# Patient Record
Sex: Female | Born: 1940 | Race: White | Hispanic: No | Marital: Married | Smoking: Never smoker
Health system: Southern US, Community
[De-identification: ages and names within clinical notes are randomized; demographics above are authoritative.]

## PROBLEM LIST (undated history)

## (undated) DIAGNOSIS — I10 Essential (primary) hypertension: Secondary | ICD-10-CM

## (undated) DIAGNOSIS — J45909 Unspecified asthma, uncomplicated: Secondary | ICD-10-CM

## (undated) DIAGNOSIS — I4891 Unspecified atrial fibrillation: Secondary | ICD-10-CM

## (undated) DIAGNOSIS — I499 Cardiac arrhythmia, unspecified: Secondary | ICD-10-CM

## (undated) DIAGNOSIS — C439 Malignant melanoma of skin, unspecified: Secondary | ICD-10-CM

## (undated) DIAGNOSIS — T4145XA Adverse effect of unspecified anesthetic, initial encounter: Secondary | ICD-10-CM

## (undated) DIAGNOSIS — K219 Gastro-esophageal reflux disease without esophagitis: Secondary | ICD-10-CM

## (undated) DIAGNOSIS — J189 Pneumonia, unspecified organism: Secondary | ICD-10-CM

## (undated) DIAGNOSIS — L570 Actinic keratosis: Secondary | ICD-10-CM

## (undated) DIAGNOSIS — Z87442 Personal history of urinary calculi: Secondary | ICD-10-CM

## (undated) DIAGNOSIS — F419 Anxiety disorder, unspecified: Secondary | ICD-10-CM

## (undated) DIAGNOSIS — Z803 Family history of malignant neoplasm of breast: Secondary | ICD-10-CM

## (undated) DIAGNOSIS — D649 Anemia, unspecified: Secondary | ICD-10-CM

## (undated) DIAGNOSIS — S060X9A Concussion with loss of consciousness of unspecified duration, initial encounter: Secondary | ICD-10-CM

## (undated) DIAGNOSIS — C801 Malignant (primary) neoplasm, unspecified: Secondary | ICD-10-CM

## (undated) DIAGNOSIS — I4819 Other persistent atrial fibrillation: Secondary | ICD-10-CM

## (undated) DIAGNOSIS — C50919 Malignant neoplasm of unspecified site of unspecified female breast: Secondary | ICD-10-CM

## (undated) DIAGNOSIS — S060XAA Concussion with loss of consciousness status unknown, initial encounter: Secondary | ICD-10-CM

## (undated) DIAGNOSIS — S53106A Unspecified dislocation of unspecified ulnohumeral joint, initial encounter: Secondary | ICD-10-CM

## (undated) DIAGNOSIS — I119 Hypertensive heart disease without heart failure: Secondary | ICD-10-CM

## (undated) DIAGNOSIS — T8859XA Other complications of anesthesia, initial encounter: Secondary | ICD-10-CM

## (undated) DIAGNOSIS — M1990 Unspecified osteoarthritis, unspecified site: Secondary | ICD-10-CM

## (undated) DIAGNOSIS — Z923 Personal history of irradiation: Secondary | ICD-10-CM

## (undated) HISTORY — PX: BUNIONECTOMY: SHX129

## (undated) HISTORY — PX: MASTECTOMY: SHX3

## (undated) HISTORY — PX: KNEE ARTHROSCOPY: SUR90

## (undated) HISTORY — DX: Other persistent atrial fibrillation: I48.19

## (undated) HISTORY — PX: CATARACT EXTRACTION: SUR2

## (undated) HISTORY — PX: COLONOSCOPY W/ POLYPECTOMY: SHX1380

## (undated) HISTORY — DX: Family history of malignant neoplasm of breast: Z80.3

## (undated) HISTORY — DX: Hypertensive heart disease without heart failure: I11.9

## (undated) HISTORY — PX: OTHER SURGICAL HISTORY: SHX169

## (undated) HISTORY — DX: Personal history of irradiation: Z92.3

## (undated) HISTORY — DX: Malignant melanoma of skin, unspecified: C43.9

## (undated) HISTORY — PX: MELANOMA EXCISION: SHX5266

## (undated) HISTORY — PX: JOINT REPLACEMENT: SHX530

---

## 1999-05-18 ENCOUNTER — Other Ambulatory Visit: Admission: RE | Admit: 1999-05-18 | Discharge: 1999-05-18 | Payer: Self-pay | Admitting: Family Medicine

## 2000-05-19 ENCOUNTER — Other Ambulatory Visit: Admission: RE | Admit: 2000-05-19 | Discharge: 2000-05-19 | Payer: Self-pay | Admitting: Family Medicine

## 2001-05-19 ENCOUNTER — Other Ambulatory Visit: Admission: RE | Admit: 2001-05-19 | Discharge: 2001-05-19 | Payer: Self-pay | Admitting: Family Medicine

## 2001-11-01 ENCOUNTER — Encounter: Admission: RE | Admit: 2001-11-01 | Discharge: 2001-11-01 | Payer: Self-pay | Admitting: Urology

## 2001-11-01 ENCOUNTER — Encounter: Payer: Self-pay | Admitting: Urology

## 2002-05-21 ENCOUNTER — Other Ambulatory Visit: Admission: RE | Admit: 2002-05-21 | Discharge: 2002-05-21 | Payer: Self-pay | Admitting: Family Medicine

## 2003-05-27 ENCOUNTER — Other Ambulatory Visit: Admission: RE | Admit: 2003-05-27 | Discharge: 2003-05-27 | Payer: Self-pay | Admitting: Family Medicine

## 2003-08-05 ENCOUNTER — Ambulatory Visit (HOSPITAL_COMMUNITY): Admission: RE | Admit: 2003-08-05 | Discharge: 2003-08-05 | Payer: Self-pay | Admitting: Gastroenterology

## 2004-05-27 ENCOUNTER — Other Ambulatory Visit: Admission: RE | Admit: 2004-05-27 | Discharge: 2004-05-27 | Payer: Self-pay | Admitting: Family Medicine

## 2005-07-06 ENCOUNTER — Other Ambulatory Visit: Admission: RE | Admit: 2005-07-06 | Discharge: 2005-07-06 | Payer: Self-pay | Admitting: Family Medicine

## 2005-10-26 ENCOUNTER — Ambulatory Visit (HOSPITAL_COMMUNITY): Admission: RE | Admit: 2005-10-26 | Discharge: 2005-10-26 | Payer: Self-pay | Admitting: Otolaryngology

## 2006-07-12 ENCOUNTER — Other Ambulatory Visit: Admission: RE | Admit: 2006-07-12 | Discharge: 2006-07-12 | Payer: Self-pay | Admitting: Family Medicine

## 2007-07-13 ENCOUNTER — Other Ambulatory Visit: Admission: RE | Admit: 2007-07-13 | Discharge: 2007-07-13 | Payer: Self-pay | Admitting: Family Medicine

## 2008-07-28 ENCOUNTER — Ambulatory Visit: Payer: Self-pay | Admitting: Diagnostic Radiology

## 2008-07-28 ENCOUNTER — Emergency Department (HOSPITAL_BASED_OUTPATIENT_CLINIC_OR_DEPARTMENT_OTHER): Admission: EM | Admit: 2008-07-28 | Discharge: 2008-07-28 | Payer: Self-pay | Admitting: Emergency Medicine

## 2008-09-27 ENCOUNTER — Other Ambulatory Visit: Admission: RE | Admit: 2008-09-27 | Discharge: 2008-09-27 | Payer: Self-pay | Admitting: Family Medicine

## 2010-02-20 ENCOUNTER — Inpatient Hospital Stay (HOSPITAL_COMMUNITY): Admission: EM | Admit: 2010-02-20 | Discharge: 2010-02-21 | Payer: Self-pay | Admitting: Emergency Medicine

## 2010-02-20 ENCOUNTER — Ambulatory Visit: Payer: Self-pay | Admitting: Diagnostic Radiology

## 2010-02-20 ENCOUNTER — Encounter: Payer: Self-pay | Admitting: Emergency Medicine

## 2010-03-09 ENCOUNTER — Encounter: Admission: RE | Admit: 2010-03-09 | Discharge: 2010-03-09 | Payer: Self-pay | Admitting: Neurological Surgery

## 2010-06-10 LAB — BASIC METABOLIC PANEL
BUN: 18 mg/dL (ref 6–23)
CO2: 30 mEq/L (ref 19–32)
Calcium: 9.8 mg/dL (ref 8.4–10.5)
Chloride: 100 mEq/L (ref 96–112)
Creatinine, Ser: 0.88 mg/dL (ref 0.4–1.2)
GFR calc Af Amer: >60 mL/min (ref >60)
GFR calc non Af Amer: >60 mL/min (ref >60)
Glucose, Bld: 98 mg/dL (ref 70–99)
Potassium: 3.2 mEq/L — ABNORMAL LOW (ref 3.5–5.1)
Sodium: 138 mEq/L (ref 135–145)

## 2010-06-10 LAB — CBC
HCT: 39.2 % (ref 36.0–46.0)
Hemoglobin: 13.3 g/dL (ref 12.0–15.0)
MCH: 32.1 pg (ref 26.0–34.0)
MCHC: 33.9 g/dL (ref 30.0–36.0)
MCV: 94.7 fL (ref 78.0–100.0)
Platelets: 303 10*3/uL (ref 150–400)
RBC: 4.14 MIL/uL (ref 3.87–5.11)
RDW: 12.4 % (ref 11.5–15.5)
WBC: 10.9 10*3/uL — ABNORMAL HIGH (ref 4.0–10.5)

## 2010-06-16 ENCOUNTER — Ambulatory Visit (HOSPITAL_COMMUNITY)
Admission: RE | Admit: 2010-06-16 | Discharge: 2010-06-16 | Payer: Self-pay | Source: Home / Self Care | Attending: Obstetrics and Gynecology | Admitting: Obstetrics and Gynecology

## 2010-08-20 LAB — BASIC METABOLIC PANEL
BUN: 18 mg/dL (ref 6–23)
BUN: 9 mg/dL (ref 6–23)
CO2: 26 mEq/L (ref 19–32)
CO2: 28 mEq/L (ref 19–32)
Calcium: 10.7 mg/dL — ABNORMAL HIGH (ref 8.4–10.5)
Calcium: 8.9 mg/dL (ref 8.4–10.5)
Chloride: 101 mEq/L (ref 96–112)
Chloride: 95 mEq/L — ABNORMAL LOW (ref 96–112)
Creatinine, Ser: 0.84 mg/dL (ref 0.4–1.2)
Creatinine, Ser: 0.9 mg/dL (ref 0.4–1.2)
GFR calc Af Amer: >60 mL/min (ref >60)
GFR calc Af Amer: >60 mL/min (ref >60)
GFR calc non Af Amer: >60 mL/min (ref >60)
GFR calc non Af Amer: >60 mL/min (ref >60)
Glucose, Bld: 106 mg/dL — ABNORMAL HIGH (ref 70–99)
Glucose, Bld: 109 mg/dL — ABNORMAL HIGH (ref 70–99)
Potassium: 2.9 mEq/L — ABNORMAL LOW (ref 3.5–5.1)
Potassium: 3.4 mEq/L — ABNORMAL LOW (ref 3.5–5.1)
Sodium: 131 mEq/L — ABNORMAL LOW (ref 135–145)
Sodium: 142 mEq/L (ref 135–145)

## 2010-08-20 LAB — CBC
HCT: 36.1 % (ref 36.0–46.0)
HCT: 38.8 % (ref 36.0–46.0)
Hemoglobin: 12 g/dL (ref 12.0–15.0)
Hemoglobin: 14 g/dL (ref 12.0–15.0)
MCH: 32.8 pg (ref 26.0–34.0)
MCH: 35 pg — ABNORMAL HIGH (ref 26.0–34.0)
MCHC: 33.2 g/dL (ref 30.0–36.0)
MCHC: 36 g/dL (ref 30.0–36.0)
MCV: 97.3 fL (ref 78.0–100.0)
MCV: 98.6 fL (ref 78.0–100.0)
Platelets: 210 10*3/uL (ref 150–400)
Platelets: 527 10*3/uL — ABNORMAL HIGH (ref 150–400)
RBC: 3.66 MIL/uL — ABNORMAL LOW (ref 3.87–5.11)
RBC: 3.99 MIL/uL (ref 3.87–5.11)
RDW: 11.5 % (ref 11.5–15.5)
RDW: 12.1 % (ref 11.5–15.5)
WBC: 10.1 10*3/uL (ref 4.0–10.5)
WBC: 10.2 10*3/uL (ref 4.0–10.5)

## 2010-08-20 LAB — URINALYSIS, ROUTINE W REFLEX MICROSCOPIC
Bilirubin Urine: NEGATIVE
Glucose, UA: NEGATIVE mg/dL
Ketones, ur: NEGATIVE mg/dL
Leukocytes, UA: NEGATIVE
Nitrite: NEGATIVE
Protein, ur: NEGATIVE mg/dL
Specific Gravity, Urine: 1.01 (ref 1.005–1.030)
Urobilinogen, UA: 0.2 mg/dL (ref 0.0–1.0)
pH: 6.5 (ref 5.0–8.0)

## 2010-08-20 LAB — DIFFERENTIAL
Basophils Absolute: 0.1 10*3/uL (ref 0.0–0.1)
Basophils Relative: 1 % (ref 0–1)
Eosinophils Absolute: 0.2 10*3/uL (ref 0.0–0.7)
Eosinophils Relative: 2 % (ref 0–5)
Lymphocytes Relative: 22 % (ref 12–46)
Lymphs Abs: 2.2 10*3/uL (ref 0.7–4.0)
Monocytes Absolute: 0.9 10*3/uL (ref 0.1–1.0)
Monocytes Relative: 9 % (ref 3–12)
Neutro Abs: 6.8 10*3/uL (ref 1.7–7.7)
Neutrophils Relative %: 67 % (ref 43–77)

## 2010-08-20 LAB — URINE MICROSCOPIC-ADD ON

## 2010-08-20 LAB — PROTIME-INR
INR: 0.96 (ref 0.00–1.49)
Prothrombin Time: 13 seconds (ref 11.6–15.2)

## 2010-08-20 LAB — MRSA PCR SCREENING: MRSA by PCR: NEGATIVE

## 2010-09-22 LAB — DIFFERENTIAL
Basophils Absolute: 0 10*3/uL (ref 0.0–0.1)
Basophils Relative: 0 % (ref 0–1)
Eosinophils Absolute: 0.2 10*3/uL (ref 0.0–0.7)
Eosinophils Relative: 2 % (ref 0–5)
Lymphocytes Relative: 21 % (ref 12–46)
Lymphs Abs: 2.8 10*3/uL (ref 0.7–4.0)
Monocytes Absolute: 1.3 10*3/uL — ABNORMAL HIGH (ref 0.1–1.0)
Monocytes Relative: 10 % (ref 3–12)
Neutro Abs: 9.1 10*3/uL — ABNORMAL HIGH (ref 1.7–7.7)
Neutrophils Relative %: 67 % (ref 43–77)

## 2010-09-22 LAB — COMPREHENSIVE METABOLIC PANEL
ALT: 12 U/L (ref 0–35)
AST: 28 U/L (ref 0–37)
Albumin: 4.5 g/dL (ref 3.5–5.2)
Alkaline Phosphatase: 85 U/L (ref 39–117)
BUN: 11 mg/dL (ref 6–23)
CO2: 30 mEq/L (ref 19–32)
Calcium: 11 mg/dL — ABNORMAL HIGH (ref 8.4–10.5)
Chloride: 96 mEq/L (ref 96–112)
Creatinine, Ser: 0.7 mg/dL (ref 0.4–1.2)
GFR calc Af Amer: >60 mL/min (ref >60)
GFR calc non Af Amer: >60 mL/min (ref >60)
Glucose, Bld: 93 mg/dL (ref 70–99)
Potassium: 3.2 mEq/L — ABNORMAL LOW (ref 3.5–5.1)
Sodium: 140 mEq/L (ref 135–145)
Total Bilirubin: 0.8 mg/dL (ref 0.3–1.2)
Total Protein: 8.6 g/dL — ABNORMAL HIGH (ref 6.0–8.3)

## 2010-09-22 LAB — CBC
HCT: 40.2 % (ref 36.0–46.0)
Hemoglobin: 13.9 g/dL (ref 12.0–15.0)
MCHC: 34.5 g/dL (ref 30.0–36.0)
MCV: 100.3 fL — ABNORMAL HIGH (ref 78.0–100.0)
Platelets: 361 10*3/uL (ref 150–400)
RBC: 4 MIL/uL (ref 3.87–5.11)
RDW: 13.4 % (ref 11.5–15.5)
WBC: 13.4 10*3/uL — ABNORMAL HIGH (ref 4.0–10.5)

## 2010-09-22 LAB — URINALYSIS, ROUTINE W REFLEX MICROSCOPIC
Bilirubin Urine: NEGATIVE
Glucose, UA: NEGATIVE mg/dL
Ketones, ur: NEGATIVE mg/dL
Leukocytes, UA: NEGATIVE
Nitrite: NEGATIVE
Protein, ur: NEGATIVE mg/dL
Specific Gravity, Urine: 1.005 (ref 1.005–1.030)
Urobilinogen, UA: 0.2 mg/dL (ref 0.0–1.0)
pH: 7.5 (ref 5.0–8.0)

## 2010-09-22 LAB — URINE MICROSCOPIC-ADD ON

## 2010-09-22 LAB — LIPASE, BLOOD: Lipase: 77 U/L (ref 23–300)

## 2010-10-19 ENCOUNTER — Other Ambulatory Visit: Payer: Self-pay | Admitting: Obstetrics and Gynecology

## 2010-10-23 NOTE — Op Note (Signed)
NAME:  [name], [name]                         ACCOUNT NO.:  [id number]   MEDICAL RECORD NO.:  [id number]                   PATIENT TYPE:  AMB   LOCATION:  ENDO                                 FACILITY:  [facility]   PHYSICIAN:  John C. Madilyn Fireman, M.D.                 DATE OF BIRTH:  [birth year]   DATE OF PROCEDURE:  08/05/2003  DATE OF DISCHARGE:                                 OPERATIVE REPORT   PROCEDURE:  Colonoscopy.   INDICATIONS FOR PROCEDURE:  Initial colon cancer screening in a 70 year old  patient.   DESCRIPTION OF PROCEDURE:  The patient was placed in the left lateral  decubitus position and placed on the pulse monitor with continuous low-flow  oxygen delivered by nasal cannula.  She was sedated with 100 mcg IV fentanyl  and 10 mg IV Versed.  The Olympus video colonoscope was inserted into the  rectum and advanced to the cecum, confirmed by transillumination of  McBurney's point and visualization of the ileocecal valve and appendiceal  orifice.  Prep was good.  The cecum appeared normal with no masses, polyps,  diverticula, or other mucosal abnormalities.  Within the ascending,  transverse, descending, and sigmoid, there were a few widely scattered  diverticula noted.  Otherwise no masses, polyps or other mucosal  abnormalities.  The rectum appeared normal and retroflexed view of the anus  revealed no obvious internal hemorrhoids.  The scope was then withdrawn and  the patient returned to the recovery room in stable condition.  She  tolerated the procedure well and there were no immediate complications.   PLAN:  The next colon screening by sigmoidoscopy in five years.                                               John C. Madilyn Fireman, M.D.    [facility]MED[facility]  D:  08/05/2003  T:  08/05/2003  Job:  [id number]

## 2011-03-28 ENCOUNTER — Encounter: Payer: Self-pay | Admitting: *Deleted

## 2011-03-28 ENCOUNTER — Emergency Department (HOSPITAL_BASED_OUTPATIENT_CLINIC_OR_DEPARTMENT_OTHER): Payer: Medicare Other

## 2011-03-28 ENCOUNTER — Emergency Department (INDEPENDENT_AMBULATORY_CARE_PROVIDER_SITE_OTHER): Payer: Medicare Other

## 2011-03-28 ENCOUNTER — Emergency Department (HOSPITAL_BASED_OUTPATIENT_CLINIC_OR_DEPARTMENT_OTHER)
Admission: EM | Admit: 2011-03-28 | Discharge: 2011-03-28 | Disposition: A | Discharge status: Home / Self Care | Payer: Medicare Other | Attending: Emergency Medicine | Admitting: Emergency Medicine

## 2011-03-28 DIAGNOSIS — J45909 Unspecified asthma, uncomplicated: Secondary | ICD-10-CM | POA: Insufficient documentation

## 2011-03-28 DIAGNOSIS — M25459 Effusion, unspecified hip: Secondary | ICD-10-CM

## 2011-03-28 DIAGNOSIS — I10 Essential (primary) hypertension: Secondary | ICD-10-CM | POA: Insufficient documentation

## 2011-03-28 DIAGNOSIS — Z79899 Other long term (current) drug therapy: Secondary | ICD-10-CM | POA: Insufficient documentation

## 2011-03-28 DIAGNOSIS — S7000XA Contusion of unspecified hip, initial encounter: Secondary | ICD-10-CM

## 2011-03-28 DIAGNOSIS — Y9352 Activity, horseback riding: Secondary | ICD-10-CM

## 2011-03-28 HISTORY — DX: Essential (primary) hypertension: I10

## 2011-03-28 MED ORDER — OXYCODONE-ACETAMINOPHEN 5-325 MG PO TABS: 1.0000 | ORAL_TABLET | ORAL | Status: AC | PRN

## 2011-03-28 MED ORDER — ONDANSETRON HCL 4 MG/2ML IJ SOLN
4.0000 mg | Freq: Once | INTRAMUSCULAR | Status: AC
  Administered 2011-03-28: 4 mg via INTRAMUSCULAR
  Filled 2011-03-28: qty 2

## 2011-03-28 MED ORDER — PROMETHAZINE HCL 25 MG PO TABS: 25.0000 mg | ORAL_TABLET | Freq: Four times a day (QID) | ORAL | Status: AC | PRN

## 2011-03-28 MED ORDER — MORPHINE SULFATE 4 MG/ML IJ SOLN
8.0000 mg | Freq: Once | INTRAMUSCULAR | Status: AC
  Administered 2011-03-28: 8 mg via INTRAMUSCULAR
  Filled 2011-03-28: qty 2

## 2011-03-28 MED ORDER — PROMETHAZINE HCL 25 MG PO TABS
25.0000 mg | ORAL_TABLET | Freq: Once | ORAL | Status: AC
  Administered 2011-03-28: 25 mg via ORAL
  Filled 2011-03-28: qty 1

## 2011-03-28 MED ORDER — OXYCODONE-ACETAMINOPHEN 5-325 MG PO TABS
1.0000 | ORAL_TABLET | Freq: Once | ORAL | Status: AC
Start: 2011-03-28 — End: 2011-03-28
  Administered 2011-03-28: 1 via ORAL
  Filled 2011-03-28: qty 1

## 2011-03-28 NOTE — ED Provider Notes (Addendum)
History     CSN: [id number] Arrival date & time: 03/28/2011  7:13 PM   First MD Initiated Contact with Patient 03/28/11 2044      Chief Complaint  Patient presents with  . Hip Pain    (Consider location/radiation/quality/duration/timing/severity/associated sxs/prior treatment) HPI Comments: Patient is a 70 year old woman who was riding horse. She says the horse got spooked and through her. She landed on her back. She was able to stand and get back on the horse. As time has passed she has developed pain and swelling in the left buttock. She took ibuprofen without relief.  Patient is a 71 y.o. female presenting with hip pain. The history is provided by the patient and the nursing home. No language interpreter was used.  Hip Pain This is a new problem. The current episode started 3 to 5 hours ago. The problem occurs constantly. The problem has been gradually worsening. Exacerbated by: She has tenderness over the left buttock, so that she cannot turn on her back without pain. The symptoms are relieved by nothing. Treatments tried: She took ibuprofen without relief.    Past Medical History  Diagnosis Date  . Hypertension   . Asthma     History reviewed. No pertinent past surgical history.  History reviewed. No pertinent family history.  History  Substance Use Topics  . Smoking status: Never Smoker   . Smokeless tobacco: Not on file  . Alcohol Use: No    OB History    Grav Para Term Preterm Abortions TAB SAB Ect Mult Living                  Review of Systems  Constitutional: Negative.   HENT: Negative.   Eyes: Negative.   Respiratory: Negative.   Cardiovascular: Negative.   Gastrointestinal: Negative.   Genitourinary: Negative.   Musculoskeletal:       She complains of pain and swelling in the left buttock.  Neurological: Negative.   Psychiatric/Behavioral: Negative.     Allergies  Review of patient's allergies indicates no known allergies.  Home Medications    Current Outpatient Rx  Name Route Sig Dispense Refill  . ASPIRIN EC 81 MG PO TBEC Oral Take 81 mg by mouth daily.      . AZELASTINE HCL 137 MCG/SPRAY NA SOLN Nasal Place 1 spray into the nose 2 (two) times daily. Use in each nostril as directed     . B COMPLEX PO TABS Oral Take 1 tablet by mouth daily.      . BUDESONIDE-FORMOTEROL FUMARATE 160-4.5 MCG/ACT IN AERO Inhalation Inhale 2 puffs into the lungs daily as needed. For allergy symptoms     . VITAMIN D 1000 UNITS PO TABS Oral Take 1,000 Units by mouth daily.      Marland Kitchen ESTROGENS, CONJUGATED 0.625 MG/GM VA CREA Vaginal Place 0.5 g vaginally daily.      . CYCLOSPORINE 0.05 % OP EMUL Both Eyes Place 1 drop into both eyes daily.      Marland Kitchen FOLIC ACID 400 MCG PO TABS Oral Take 400 mcg by mouth daily.      . IBUPROFEN 800 MG PO TABS Oral Take 800 mg by mouth every 8 (eight) hours as needed. For pain     . LEVOCETIRIZINE DIHYDROCHLORIDE 5 MG PO TABS Oral Take 5 mg by mouth every evening.      . NONFORMULARY/COMPOUNDED ITEM Injection Inject 1 each as directed every 7 (seven) days. Allergy shots on Tuesday     . OYSTER CALCIUM  500 MG PO TABS Oral Take 500 mg by mouth daily.      Marland Kitchen POTASSIUM 99 MG PO TABS Oral Take 1 tablet by mouth daily.      Marland Kitchen PRAVASTATIN SODIUM 80 MG PO TABS Oral Take 80 mg by mouth daily.      . TRIAMTERENE-HCTZ 37.5-25 MG PO TABS Oral Take 1 tablet by mouth daily.      Marland Kitchen VITAMIN C 500 MG PO TABS Oral Take 500 mg by mouth daily.      . OXYCODONE-ACETAMINOPHEN 5-325 MG PO TABS Oral Take 1 tablet by mouth every 4 (four) hours as needed for pain. 20 tablet 0  . PROMETHAZINE HCL 25 MG PO TABS Oral Take 1 tablet (25 mg total) by mouth every 6 (six) hours as needed for nausea. 20 tablet 0    BP 97/57  Pulse 67  Temp(Src) 97.5 F (36.4 C) (Oral)  Resp 18  Ht 5\' 1"  (1.549 m)  Wt 150 lb (68.04 kg)  BMI 28.34 kg/m2  SpO2 97%  Physical Exam  Constitutional: She is oriented to person, place, and time. She appears well-developed and  well-nourished. Distressed: in moderate distress with pain in the left buttock.  HENT:  Head: Normocephalic and atraumatic.  Eyes: EOM are normal. Pupils are equal, round, and reactive to light.  Neck: Normal range of motion. Neck supple.       No deformity or tenderness of the cervical spine.  Pulmonary/Chest: Effort normal and breath sounds normal.  Abdominal: Soft. Bowel sounds are normal.  Musculoskeletal:       She has a hematoma approximately 10 cm in diameter overlying the left buttock muscles. There is no palpable bony deformity of her pelvis or hip.  Neurological: She is alert and oriented to person, place, and time.        There is no sensory or motor deficit.  Skin: Skin is warm and dry.  Psychiatric: She has a normal mood and affect. Her behavior is normal.    ED Course  Procedures (including critical care time) 10:25 PM Patient was seen and had physical examination. IM morphine and Zofran were ordered. X-ray of the left hip was ordered.  10:25 PM Patient had negative x-rays of her pelvis and left hip to my view. She will need prescriptions for Percocet for pain and Phenergan for nausea. She can followup with her orthopedist, Valma Cava M.D. at [facility].     1. Hematoma of hip           Carleene Cooper III, MD 03/28/11 2225  Carleene Cooper III, MD 03/30/11 [phone]

## 2011-03-28 NOTE — ED Notes (Signed)
Pt states that nausea persists and that pain has decreased only slightly.  Dr Ignacia Palma notified.

## 2011-03-28 NOTE — ED Notes (Signed)
Patient was thrown off her horse this afternoon, now presents with a large swollen area on her left buttock. Denies pain to lower back, thigh area. States when she fell it was directly on the area of swelling.

## 2012-04-12 ENCOUNTER — Other Ambulatory Visit: Payer: Self-pay | Admitting: Family Medicine

## 2012-04-12 ENCOUNTER — Other Ambulatory Visit (HOSPITAL_COMMUNITY)
Admission: RE | Admit: 2012-04-12 | Discharge: 2012-04-12 | Disposition: A | Discharge status: Home / Self Care | Payer: Medicare Other | Source: Ambulatory Visit | Attending: Family Medicine | Admitting: Family Medicine

## 2012-04-12 DIAGNOSIS — Z124 Encounter for screening for malignant neoplasm of cervix: Secondary | ICD-10-CM | POA: Insufficient documentation

## 2013-01-13 ENCOUNTER — Other Ambulatory Visit: Payer: Self-pay | Admitting: Orthopedic Surgery

## 2013-01-13 NOTE — Progress Notes (Signed)
Preoperative surgical orders have been place into the Epic hospital system for [name] on 01/13/2013, 10:11 AM  by Patrica Duel for surgery on 01/29/13.  Preop Total Knee orders including Experal, PO Tylenol, and IV Decadron as long as there are no contraindications to the above medications. Avel Peace, PA-C

## 2013-01-17 ENCOUNTER — Other Ambulatory Visit: Payer: Self-pay | Admitting: Orthopedic Surgery

## 2013-01-17 ENCOUNTER — Encounter (HOSPITAL_COMMUNITY): Payer: Self-pay | Admitting: Pharmacy Technician

## 2013-01-22 ENCOUNTER — Ambulatory Visit (HOSPITAL_COMMUNITY)
Admission: RE | Admit: 2013-01-22 | Discharge: 2013-01-22 | Disposition: A | Discharge status: Home / Self Care | Payer: Medicare Other | Source: Ambulatory Visit | Attending: Orthopedic Surgery | Admitting: Orthopedic Surgery

## 2013-01-22 ENCOUNTER — Encounter (HOSPITAL_COMMUNITY): Payer: Self-pay

## 2013-01-22 ENCOUNTER — Encounter (HOSPITAL_COMMUNITY)
Admission: RE | Admit: 2013-01-22 | Discharge: 2013-01-22 | Disposition: A | Discharge status: Home / Self Care | Payer: Medicare Other | Source: Ambulatory Visit | Attending: Orthopedic Surgery | Admitting: Orthopedic Surgery

## 2013-01-22 DIAGNOSIS — Z01818 Encounter for other preprocedural examination: Secondary | ICD-10-CM | POA: Insufficient documentation

## 2013-01-22 DIAGNOSIS — M1710 Unilateral primary osteoarthritis, unspecified knee: Secondary | ICD-10-CM | POA: Insufficient documentation

## 2013-01-22 DIAGNOSIS — Z01812 Encounter for preprocedural laboratory examination: Secondary | ICD-10-CM | POA: Insufficient documentation

## 2013-01-22 DIAGNOSIS — I10 Essential (primary) hypertension: Secondary | ICD-10-CM | POA: Insufficient documentation

## 2013-01-22 HISTORY — DX: Pneumonia, unspecified organism: J18.9

## 2013-01-22 HISTORY — DX: Unspecified osteoarthritis, unspecified site: M19.90

## 2013-01-22 HISTORY — DX: Other complications of anesthesia, initial encounter: T88.59XA

## 2013-01-22 HISTORY — DX: Anxiety disorder, unspecified: F41.9

## 2013-01-22 HISTORY — DX: Malignant (primary) neoplasm, unspecified: C80.1

## 2013-01-22 HISTORY — DX: Adverse effect of unspecified anesthetic, initial encounter: T41.45XA

## 2013-01-22 LAB — URINALYSIS, ROUTINE W REFLEX MICROSCOPIC
Bilirubin Urine: NEGATIVE
Glucose, UA: NEGATIVE mg/dL
Ketones, ur: NEGATIVE mg/dL
Protein, ur: NEGATIVE mg/dL
Urobilinogen, UA: 0.2 mg/dL (ref 0.0–1.0)

## 2013-01-22 LAB — URINE MICROSCOPIC-ADD ON

## 2013-01-22 LAB — PROTIME-INR
INR: 0.98 (ref 0.00–1.49)
Prothrombin Time: 12.8 s (ref 11.6–15.2)

## 2013-01-22 LAB — COMPREHENSIVE METABOLIC PANEL
CO2: 26 mEq/L (ref 19–32)
Calcium: 10.1 mg/dL (ref 8.4–10.5)
Creatinine, Ser: 0.9 mg/dL (ref 0.50–1.10)
GFR calc Af Amer: 74 mL/min — ABNORMAL LOW (ref >90)
GFR calc non Af Amer: 64 mL/min — ABNORMAL LOW (ref >90)
Glucose, Bld: 80 mg/dL (ref 70–99)
Total Protein: 7.4 g/dL (ref 6.0–8.3)

## 2013-01-22 LAB — SURGICAL PCR SCREEN
MRSA, PCR: NEGATIVE
Staphylococcus aureus: NEGATIVE

## 2013-01-22 LAB — ABO/RH: ABO/RH(D): O POS

## 2013-01-22 LAB — APTT: aPTT: 30 seconds (ref 24–37)

## 2013-01-22 NOTE — Patient Instructions (Signed)
[name]  01/22/2013   Your procedure is scheduled on:  01/29/13               Surgery 0925am-1015am  Report to [facility] at    0630  AM.  Call this number if you have problems the morning of surgery: [phone]   Remember:   Do not eat food or drink liquids after midnight.   Take these medicines the morning of surgery with A SIP OF WATER:    Do not wear jewelry, make-up or nail polish.  Do not wear lotions, powders, or perfumes.   Do not shave 48 hours prior to surgery.  Do not bring valuables to the hospital.  Contacts, dentures or bridgework may not be worn into surgery.  Leave suitcase in the car. After surgery it may be brought to your room.  For patients admitted to the hospital, checkout time is 11:00 AM the day of  discharge.   SEE CHG INSTRUCTION SHEET    Please read over the following fact sheets that you were given: MRSA Information, coughing and deep breathing exercises, leg exercises, Blood Transfusion Fact sheet, Incentive SPirometry Fact Sheet                Failure to comply with these instructions may result in cancellation of your surgery.                Patient Signature ____________________________              Nurse Signature _____________________________

## 2013-01-22 NOTE — Progress Notes (Signed)
Dr Eldridge Dace 01/01/13 on chart EKG 11/29/12 on chart Stress Test 01/10/13 on chart  CBC with DIff done 01/11/13 on chart

## 2013-01-28 ENCOUNTER — Other Ambulatory Visit: Payer: Self-pay | Admitting: Orthopedic Surgery

## 2013-01-28 NOTE — H&P (Signed)
[name]  DOB: 03/12/1941 Married / Language: English / Race: White Female  Date of Admission:  01/29/2013  Chief Complaint:  Right Knee Pain  History of Present Illness The patient is a 71 year old female who comes in for a preoperative History and Physical. The patient is scheduled for a right total knee arthroplasty to be performed by Dr. Frank V. Aluisio, MD at [facility] on 01/29/2013. The patient is a 71 year old female who presents with knee complaints. The patient is seen in referral from Dr. Collins for bilateral knees. The patient reports left knee and right knee symptoms including: pain . Prior to being seen today the patient was previously evaluated in this clinic (by Dr. Collins). Previous work-up for this problem has included knee x-rays and arthroscopy (on 04/18/12). Past treatment for this problem has included intra-articular injection of corticosteroids (by Bryson on 09/18/12). Note for "Knee pain": She states the injections helped, but are already starting to wear off. She states that the knees are hurting her at all times now. They are really limiting what she can and can not do. Both hurt badly but the right is slightly worse than the left. She has had injections in the past which provided very short term benefit. She has had a left knee scope in the past and it did not provide a tremendous amount of benefit. The left knee was scoped in November of 2013. It does not hurt as much as the right one does now. She has had visco supplements also without much benefit. She is at a stage where the knees are preventing her from doing things that she desires. She is generally able to sleep at night but occasionally does get some knee pain that will wake her up. She is ready to go ahead and get the right knee fixed. They have been treated conservatively in the past for the above stated problem and despite conservative measures, they continue to have progressive pain  and severe functional limitations and dysfunction. They have failed non-operative management including home exercise, medications, and injections. It is felt that they would benefit from undergoing total joint replacement. Risks and benefits of the procedure have been discussed with the patient and they elect to proceed with surgery. There are no active contraindications to surgery such as ongoing infection or rapidly progressive neurological disease.   Problem List Osteoarthritis, Knee (715.96)  Allergies Codeine Derivatives   Family History Heart Disease. mother and father Severe allergy. mother Cerebrovascular Accident. grandfather mothers side Hypertension. mother   Social History Number of flights of stairs before winded. 4-5 Alcohol use. current drinker; drinks wine; less than 5 per week Children. 1 Current work status. retired Drug/Alcohol Rehab (Currently). no Tobacco use. never smoker Marital status. married Pain Contract. no Living situation. live with spouse Illicit drug use. no Drug/Alcohol Rehab (Previously). no Exercise. Exercises daily; does other, gym / weights and team sport   Medication History Propranolol HCl ER (80MG Capsule ER 24HR, Oral) Active. Triamterene-HCTZ (37.5-25MG Capsule, Oral) Active. Levocetirizine Dihydrochloride (5MG Tablet, Oral) Active. CeleBREX (200MG Capsule, 1 (one) Capsule Oral daily, Taken starting 12/07/2012) Active. Hydrocodone-Acetaminophen (5-325MG Tablet, Oral) Active. (one per day) Aspirin EC (81MG Tablet DR, Oral) Active. Premarin (0.625MG/GM Cream, Vaginal) Active. ALPRAZolam ( Oral) Specific dose unknown - Active. Symbicort ( Inhalation) Specific dose unknown - Active.   Past Surgical History Cataract Surgery. bilateral Dilation and Curettage of Uterus Foot Surgery. right   Medical History High blood pressure Diverticulitis Of   Colon Kidney Stone Asthma Skin  Cancer Osteoarthritis Shingles Cataract Bronchitis Pneumonia Diverticulosis Urinary Tract Infection Menopause Measles Eczema   Review of Systems General:Not Present- Chills, Fever, Night Sweats, Fatigue, Weight Gain, Weight Loss and Memory Loss. Skin:Not Present- Hives, Itching, Rash, Eczema and Lesions. HEENT:Not Present- Tinnitus, Headache, Double Vision, Visual Loss, Hearing Loss and Dentures. Respiratory:Not Present- Shortness of breath with exertion, Shortness of breath at rest, Allergies, Coughing up blood and Chronic Cough. Cardiovascular:Not Present- Chest Pain, Racing/skipping heartbeats, Difficulty Breathing Lying Down, Murmur, Swelling and Palpitations. Gastrointestinal:Not Present- Bloody Stool, Heartburn, Abdominal Pain, Vomiting, Nausea, Constipation, Diarrhea, Difficulty Swallowing, Jaundice and Loss of appetitie. Female Genitourinary:Not Present- Blood in Urine, Urinary frequency, Weak urinary stream, Discharge, Flank Pain, Incontinence, Painful Urination, Urgency, Urinary Retention and Urinating at Night. Musculoskeletal:Present- Joint Swelling and Joint Pain. Not Present- Muscle Weakness, Muscle Pain, Back Pain, Morning Stiffness and Spasms. Neurological:Not Present- Tremor, Dizziness, Blackout spells, Paralysis, Difficulty with balance and Weakness. Psychiatric:Not Present- Insomnia.   Vitals Weight: 153 lb Height: 61 in Weight was reported by patient. Height was reported by patient. Body Surface Area: 1.73 m Body Mass Index: 28.91 kg/m Pulse: 68 (Regular) Resp.: 14 (Unlabored) BP: 124/70 (Sitting, Right Arm, Standard)    Physical Exam The physical exam findings are as follows:   General Mental Status - Alert, cooperative and good historian. General Appearance- pleasant. Not in acute distress. Orientation- Oriented X3. Build & Nutrition- Well nourished and Well developed.   Head and Neck Head- normocephalic, atraumatic  . Neck Global Assessment- supple. no bruit auscultated on the right and no bruit auscultated on the left.   Eye Pupil- Bilateral- Regular and Round. Motion- Bilateral- EOMI.   Chest and Lung Exam Auscultation: Breath sounds:- clear at anterior chest wall and - clear at posterior chest wall. Adventitious sounds:- No Adventitious sounds.   Cardiovascular Auscultation:Rhythm- Regular rate and rhythm. Heart Sounds- S1 WNL and S2 WNL. Murmurs & Other Heart Sounds:Auscultation of the heart reveals - No Murmurs.   Abdomen Palpation/Percussion:Tenderness- Abdomen is non-tender to palpation. Rigidity (guarding)- Abdomen is soft. Auscultation:Auscultation of the abdomen reveals - Bowel sounds normal.   Female Genitourinary Not done, not pertinent to present illness  Musculoskeletal On exam she is alert and oriented in no apparent distress. Her knees show no effusion. Her range of motion of the right knee is about 5 to 125, left is similar. She has marked crepitus on range of motion both knees. She has tenderness medial greater than lateral with no instability on both knees. Pulses, sensation and motor are intact both lower extremities.  RADIOGRAPHS: Radiographs are reviewed from April, AP both knees and lateral, showing advanced arthritic changes in both knees with bone on bone in the medial and patellofemoral compartments of both. She also has some significant changes laterally on the right.  Assessment & Plan Primary osteoarthritis of both knees (715.16) Impression: Right Knee  Note: Plan is for a Right Total Knee Replacement by Dr. Aluisio.  Plan is to go home.  PCP - Dr. Barnes  The patient does not have any contraindications and will recieve TXA (tranexamic acid) prior to surgery.  Time spent ~ 40 minutes  Signed electronically by Alexzandrew L Perkins, III PA-C  

## 2013-01-29 ENCOUNTER — Encounter (HOSPITAL_COMMUNITY)
Admission: RE | Disposition: A | Discharge status: Home Health | Payer: Self-pay | Source: Ambulatory Visit | Attending: Orthopedic Surgery

## 2013-01-29 ENCOUNTER — Encounter (HOSPITAL_COMMUNITY): Payer: Self-pay | Admitting: Anesthesiology

## 2013-01-29 ENCOUNTER — Inpatient Hospital Stay (HOSPITAL_COMMUNITY): Payer: Medicare Other | Admitting: Anesthesiology

## 2013-01-29 ENCOUNTER — Encounter (HOSPITAL_COMMUNITY): Payer: Self-pay | Admitting: *Deleted

## 2013-01-29 ENCOUNTER — Inpatient Hospital Stay (HOSPITAL_COMMUNITY)
Admission: RE | Admit: 2013-01-29 | Discharge: 2013-01-31 | DRG: 470 | Disposition: A | Discharge status: Home Health | Payer: Medicare Other | Source: Ambulatory Visit | Attending: Orthopedic Surgery | Admitting: Orthopedic Surgery

## 2013-01-29 DIAGNOSIS — J45909 Unspecified asthma, uncomplicated: Secondary | ICD-10-CM | POA: Diagnosis present

## 2013-01-29 DIAGNOSIS — M1710 Unilateral primary osteoarthritis, unspecified knee: Principal | ICD-10-CM | POA: Diagnosis present

## 2013-01-29 DIAGNOSIS — Z87442 Personal history of urinary calculi: Secondary | ICD-10-CM

## 2013-01-29 DIAGNOSIS — Z8719 Personal history of other diseases of the digestive system: Secondary | ICD-10-CM

## 2013-01-29 DIAGNOSIS — Z79899 Other long term (current) drug therapy: Secondary | ICD-10-CM

## 2013-01-29 DIAGNOSIS — M179 Osteoarthritis of knee, unspecified: Secondary | ICD-10-CM | POA: Diagnosis present

## 2013-01-29 DIAGNOSIS — Z96651 Presence of right artificial knee joint: Secondary | ICD-10-CM

## 2013-01-29 DIAGNOSIS — D62 Acute posthemorrhagic anemia: Secondary | ICD-10-CM

## 2013-01-29 DIAGNOSIS — I10 Essential (primary) hypertension: Secondary | ICD-10-CM | POA: Diagnosis present

## 2013-01-29 HISTORY — PX: TOTAL KNEE ARTHROPLASTY: SHX125

## 2013-01-29 LAB — TYPE AND SCREEN
ABO/RH(D): O POS
Antibody Screen: NEGATIVE

## 2013-01-29 SURGERY — ARTHROPLASTY, KNEE, TOTAL
Anesthesia: Spinal | Site: Knee | Laterality: Right | Wound class: Clean

## 2013-01-29 MED ORDER — SODIUM CHLORIDE 0.9 % IV SOLN
INTRAVENOUS | Status: DC
  Administered 2013-01-29 (×2): via INTRAVENOUS

## 2013-01-29 MED ORDER — BUPIVACAINE HCL 0.25 % IJ SOLN
INTRAMUSCULAR | Status: DC | PRN
  Administered 2013-01-29: 20 mL

## 2013-01-29 MED ORDER — DEXAMETHASONE SODIUM PHOSPHATE 10 MG/ML IJ SOLN
10.0000 mg | Freq: Once | INTRAMUSCULAR | Status: AC
  Administered 2013-01-29: 10 mg via INTRAVENOUS

## 2013-01-29 MED ORDER — TRAMADOL HCL 50 MG PO TABS
50.0000 mg | ORAL_TABLET | Freq: Four times a day (QID) | ORAL | Status: DC | PRN
  Administered 2013-01-30 – 2013-01-31 (×4): 100 mg via ORAL
  Filled 2013-01-29 (×4): qty 2

## 2013-01-29 MED ORDER — KETAMINE HCL 50 MG/ML IJ SOLN
INTRAMUSCULAR | Status: DC | PRN
  Administered 2013-01-29 (×5): 10 mg via INTRAMUSCULAR

## 2013-01-29 MED ORDER — PROPRANOLOL HCL ER 80 MG PO CP24
80.0000 mg | ORAL_CAPSULE | Freq: Every day | ORAL | Status: DC
  Administered 2013-01-30 – 2013-01-31 (×2): 80 mg via ORAL
  Filled 2013-01-29 (×2): qty 1

## 2013-01-29 MED ORDER — CEFAZOLIN SODIUM 1-5 GM-% IV SOLN
1.0000 g | Freq: Four times a day (QID) | INTRAVENOUS | Status: AC
  Administered 2013-01-29 (×2): 1 g via INTRAVENOUS
  Filled 2013-01-29 (×2): qty 50

## 2013-01-29 MED ORDER — POLYETHYLENE GLYCOL 3350 17 G PO PACK: 17.0000 g | PACK | Freq: Every day | ORAL | Status: DC | PRN

## 2013-01-29 MED ORDER — OXYCODONE HCL 5 MG PO TABS: 5.0000 mg | ORAL_TABLET | Freq: Once | ORAL | Status: DC | PRN

## 2013-01-29 MED ORDER — MORPHINE SULFATE 2 MG/ML IJ SOLN
1.0000 mg | INTRAMUSCULAR | Status: DC | PRN
  Administered 2013-01-29 (×2): 2 mg via INTRAVENOUS
  Filled 2013-01-29 (×2): qty 1

## 2013-01-29 MED ORDER — MEPERIDINE HCL 50 MG/ML IJ SOLN: 6.2500 mg | INTRAMUSCULAR | Status: DC | PRN

## 2013-01-29 MED ORDER — STERILE WATER FOR IRRIGATION IR SOLN
Status: DC | PRN
  Administered 2013-01-29: 3000 mL

## 2013-01-29 MED ORDER — TRIAMTERENE-HCTZ 37.5-25 MG PO TABS
1.0000 | ORAL_TABLET | Freq: Every morning | ORAL | Status: DC
  Administered 2013-01-29 – 2013-01-31 (×3): 1 via ORAL
  Filled 2013-01-29 (×3): qty 1

## 2013-01-29 MED ORDER — SODIUM CHLORIDE 0.9 % IJ SOLN
INTRAMUSCULAR | Status: DC | PRN
  Administered 2013-01-29: 10:00:00

## 2013-01-29 MED ORDER — MENTHOL 3 MG MT LOZG
1.0000 | LOZENGE | OROMUCOSAL | Status: DC | PRN
  Filled 2013-01-29: qty 9

## 2013-01-29 MED ORDER — SODIUM CHLORIDE 0.9 % IV SOLN: INTRAVENOUS | Status: DC

## 2013-01-29 MED ORDER — OXYCODONE HCL 5 MG PO TABS
5.0000 mg | ORAL_TABLET | ORAL | Status: DC | PRN
  Administered 2013-01-29 (×2): 10 mg via ORAL
  Administered 2013-01-30 (×2): 5 mg via ORAL
  Administered 2013-01-30 – 2013-01-31 (×4): 10 mg via ORAL
  Filled 2013-01-29 (×2): qty 2
  Filled 2013-01-29: qty 1
  Filled 2013-01-29 (×5): qty 2

## 2013-01-29 MED ORDER — PHENOL 1.4 % MT LIQD
1.0000 | OROMUCOSAL | Status: DC | PRN
  Filled 2013-01-29: qty 177

## 2013-01-29 MED ORDER — ONDANSETRON HCL 4 MG PO TABS: 4.0000 mg | ORAL_TABLET | Freq: Four times a day (QID) | ORAL | Status: DC | PRN

## 2013-01-29 MED ORDER — BUPIVACAINE LIPOSOME 1.3 % IJ SUSP
20.0000 mL | Freq: Once | INTRAMUSCULAR | Status: DC
  Filled 2013-01-29: qty 20

## 2013-01-29 MED ORDER — LACTATED RINGERS IV SOLN
INTRAVENOUS | Status: DC | PRN
  Administered 2013-01-29 (×2): via INTRAVENOUS

## 2013-01-29 MED ORDER — PROPRANOLOL HCL 80 MG PO TABS: 80.0000 mg | ORAL_TABLET | Freq: Every morning | ORAL | Status: DC

## 2013-01-29 MED ORDER — HYDROMORPHONE HCL PF 1 MG/ML IJ SOLN: 0.2500 mg | INTRAMUSCULAR | Status: DC | PRN

## 2013-01-29 MED ORDER — PROPOFOL 10 MG/ML IV EMUL
INTRAVENOUS | Status: DC | PRN
  Administered 2013-01-29: 20 mg via INTRAVENOUS

## 2013-01-29 MED ORDER — SODIUM CHLORIDE 0.9 % IV SOLN
1000.0000 mg | INTRAVENOUS | Status: AC
  Administered 2013-01-29: 1000 mg via INTRAVENOUS
  Filled 2013-01-29: qty 10

## 2013-01-29 MED ORDER — MIDAZOLAM HCL 5 MG/5ML IJ SOLN
INTRAMUSCULAR | Status: DC | PRN
  Administered 2013-01-29: 2 mg via INTRAVENOUS

## 2013-01-29 MED ORDER — BISACODYL 10 MG RE SUPP: 10.0000 mg | Freq: Every day | RECTAL | Status: DC | PRN

## 2013-01-29 MED ORDER — PROPOFOL INFUSION 10 MG/ML OPTIME
INTRAVENOUS | Status: DC | PRN
  Administered 2013-01-29: 100 ug/kg/min via INTRAVENOUS

## 2013-01-29 MED ORDER — DIPHENHYDRAMINE HCL 12.5 MG/5ML PO ELIX: 12.5000 mg | ORAL_SOLUTION | ORAL | Status: DC | PRN

## 2013-01-29 MED ORDER — DEXAMETHASONE 6 MG PO TABS
10.0000 mg | ORAL_TABLET | Freq: Every day | ORAL | Status: AC
  Administered 2013-01-30: 10 mg via ORAL
  Filled 2013-01-29: qty 1

## 2013-01-29 MED ORDER — POTASSIUM 99 MG PO TABS
1.0000 | ORAL_TABLET | Freq: Every day | ORAL | Status: DC
  Filled 2013-01-29: qty 1

## 2013-01-29 MED ORDER — ACETAMINOPHEN 500 MG PO TABS
1000.0000 mg | ORAL_TABLET | Freq: Once | ORAL | Status: AC
  Administered 2013-01-29: 1000 mg via ORAL
  Filled 2013-01-29: qty 2

## 2013-01-29 MED ORDER — CEFAZOLIN SODIUM-DEXTROSE 2-3 GM-% IV SOLR
2.0000 g | INTRAVENOUS | Status: AC
  Administered 2013-01-29: 2 g via INTRAVENOUS

## 2013-01-29 MED ORDER — METHOCARBAMOL 100 MG/ML IJ SOLN
500.0000 mg | Freq: Four times a day (QID) | INTRAMUSCULAR | Status: DC | PRN
  Filled 2013-01-29: qty 5

## 2013-01-29 MED ORDER — ONDANSETRON HCL 4 MG/2ML IJ SOLN
INTRAMUSCULAR | Status: DC | PRN
  Administered 2013-01-29: 4 mg via INTRAVENOUS

## 2013-01-29 MED ORDER — 0.9 % SODIUM CHLORIDE (POUR BTL) OPTIME
TOPICAL | Status: DC | PRN
  Administered 2013-01-29: 1000 mL

## 2013-01-29 MED ORDER — METHOCARBAMOL 500 MG PO TABS
500.0000 mg | ORAL_TABLET | Freq: Four times a day (QID) | ORAL | Status: DC | PRN
  Administered 2013-01-29 – 2013-01-31 (×5): 500 mg via ORAL
  Filled 2013-01-29 (×5): qty 1

## 2013-01-29 MED ORDER — OXYCODONE HCL 5 MG/5ML PO SOLN
5.0000 mg | Freq: Once | ORAL | Status: DC | PRN
  Filled 2013-01-29: qty 5

## 2013-01-29 MED ORDER — BUDESONIDE-FORMOTEROL FUMARATE 160-4.5 MCG/ACT IN AERO
2.0000 | INHALATION_SPRAY | Freq: Every day | RESPIRATORY_TRACT | Status: DC | PRN
  Filled 2013-01-29: qty 6

## 2013-01-29 MED ORDER — KETOROLAC TROMETHAMINE 15 MG/ML IJ SOLN
7.5000 mg | Freq: Four times a day (QID) | INTRAMUSCULAR | Status: AC | PRN
  Filled 2013-01-29: qty 1

## 2013-01-29 MED ORDER — EPHEDRINE SULFATE 50 MG/ML IJ SOLN
INTRAMUSCULAR | Status: DC | PRN
  Administered 2013-01-29 (×2): 5 mg via INTRAVENOUS

## 2013-01-29 MED ORDER — METOCLOPRAMIDE HCL 5 MG/ML IJ SOLN: 5.0000 mg | Freq: Three times a day (TID) | INTRAMUSCULAR | Status: DC | PRN

## 2013-01-29 MED ORDER — DEXAMETHASONE SODIUM PHOSPHATE 10 MG/ML IJ SOLN
10.0000 mg | Freq: Every day | INTRAMUSCULAR | Status: AC
  Filled 2013-01-29: qty 1

## 2013-01-29 MED ORDER — BUPIVACAINE IN DEXTROSE 0.75-8.25 % IT SOLN
INTRATHECAL | Status: DC | PRN
  Administered 2013-01-29: 2 mL via INTRATHECAL

## 2013-01-29 MED ORDER — RIVAROXABAN 10 MG PO TABS
10.0000 mg | ORAL_TABLET | Freq: Every day | ORAL | Status: DC
  Administered 2013-01-30 – 2013-01-31 (×2): 10 mg via ORAL
  Filled 2013-01-29 (×4): qty 1

## 2013-01-29 MED ORDER — FLEET ENEMA 7-19 GM/118ML RE ENEM: 1.0000 | ENEMA | Freq: Once | RECTAL | Status: AC | PRN

## 2013-01-29 MED ORDER — LEVOCETIRIZINE DIHYDROCHLORIDE 5 MG PO TABS: 5.0000 mg | ORAL_TABLET | Freq: Every evening | ORAL | Status: DC

## 2013-01-29 MED ORDER — ACETAMINOPHEN 500 MG PO TABS
1000.0000 mg | ORAL_TABLET | Freq: Four times a day (QID) | ORAL | Status: AC
  Administered 2013-01-29: 1000 mg via ORAL
  Filled 2013-01-29: qty 2

## 2013-01-29 MED ORDER — PROMETHAZINE HCL 25 MG/ML IJ SOLN: 6.2500 mg | INTRAMUSCULAR | Status: DC | PRN

## 2013-01-29 MED ORDER — PHENYLEPHRINE HCL 10 MG/ML IJ SOLN
INTRAMUSCULAR | Status: DC | PRN
  Administered 2013-01-29 (×2): 40 ug via INTRAVENOUS

## 2013-01-29 MED ORDER — LORATADINE 10 MG PO TABS
10.0000 mg | ORAL_TABLET | Freq: Every day | ORAL | Status: DC
  Administered 2013-01-30: 10 mg via ORAL
  Filled 2013-01-29 (×3): qty 1

## 2013-01-29 MED ORDER — ONDANSETRON HCL 4 MG/2ML IJ SOLN: 4.0000 mg | Freq: Four times a day (QID) | INTRAMUSCULAR | Status: DC | PRN

## 2013-01-29 MED ORDER — CHLORHEXIDINE GLUCONATE 4 % EX LIQD
60.0000 mL | Freq: Once | CUTANEOUS | Status: DC
  Filled 2013-01-29: qty 60

## 2013-01-29 MED ORDER — DOCUSATE SODIUM 100 MG PO CAPS
100.0000 mg | ORAL_CAPSULE | Freq: Two times a day (BID) | ORAL | Status: DC
  Administered 2013-01-29 – 2013-01-31 (×4): 100 mg via ORAL

## 2013-01-29 MED ORDER — METOCLOPRAMIDE HCL 10 MG PO TABS: 5.0000 mg | ORAL_TABLET | Freq: Three times a day (TID) | ORAL | Status: DC | PRN

## 2013-01-29 SURGICAL SUPPLY — 58 items
BAG SPEC THK2 15X12 ZIP CLS (MISCELLANEOUS) ×1
BAG ZIPLOCK 12X15 (MISCELLANEOUS) ×2 IMPLANT
BANDAGE ELASTIC 6 VELCRO ST LF (GAUZE/BANDAGES/DRESSINGS) ×2 IMPLANT
BANDAGE ESMARK 6X9 LF (GAUZE/BANDAGES/DRESSINGS) ×1 IMPLANT
BLADE SAG 18X100X1.27 (BLADE) ×2 IMPLANT
BLADE SAW SGTL 11.0X1.19X90.0M (BLADE) ×2 IMPLANT
BNDG CMPR 9X6 STRL LF SNTH (GAUZE/BANDAGES/DRESSINGS) ×1
BNDG ESMARK 6X9 LF (GAUZE/BANDAGES/DRESSINGS) ×2
BOWL SMART MIX CTS (DISPOSABLE) ×2 IMPLANT
CAPT RP KNEE ×1 IMPLANT
CEMENT HV SMART SET (Cement) ×4 IMPLANT
CLOTH BEACON ORANGE TIMEOUT ST (SAFETY) ×2 IMPLANT
CUFF TOURN SGL QUICK 34 (TOURNIQUET CUFF) ×2
CUFF TRNQT CYL 34X4X40X1 (TOURNIQUET CUFF) ×1 IMPLANT
DECANTER SPIKE VIAL GLASS SM (MISCELLANEOUS) ×2 IMPLANT
DRAPE EXTREMITY T 121X128X90 (DRAPE) ×2 IMPLANT
DRAPE POUCH INSTRU U-SHP 10X18 (DRAPES) ×2 IMPLANT
DRAPE U-SHAPE 47X51 STRL (DRAPES) ×2 IMPLANT
DRSG ADAPTIC 3X8 NADH LF (GAUZE/BANDAGES/DRESSINGS) ×2 IMPLANT
DRSG PAD ABDOMINAL 8X10 ST (GAUZE/BANDAGES/DRESSINGS) ×2 IMPLANT
DURAPREP 26ML APPLICATOR (WOUND CARE) ×2 IMPLANT
ELECT REM PT RETURN 9FT ADLT (ELECTROSURGICAL) ×2
ELECTRODE REM PT RTRN 9FT ADLT (ELECTROSURGICAL) ×1 IMPLANT
EVACUATOR 1/8 PVC DRAIN (DRAIN) ×2 IMPLANT
FACESHIELD LNG OPTICON STERILE (SAFETY) ×10 IMPLANT
GLOVE BIO SURGEON STRL SZ7.5 (GLOVE) IMPLANT
GLOVE BIO SURGEON STRL SZ8 (GLOVE) ×2 IMPLANT
GLOVE BIOGEL PI IND STRL 8 (GLOVE) ×2 IMPLANT
GLOVE BIOGEL PI INDICATOR 8 (GLOVE) ×2
GLOVE SURG SS PI 6.5 STRL IVOR (GLOVE) IMPLANT
GOWN STRL NON-REIN LRG LVL3 (GOWN DISPOSABLE) ×2 IMPLANT
GOWN STRL REIN XL XLG (GOWN DISPOSABLE) IMPLANT
HANDPIECE INTERPULSE COAX TIP (DISPOSABLE) ×2
IMMOBILIZER KNEE 20 (SOFTGOODS) ×2
IMMOBILIZER KNEE 20 THIGH 36 (SOFTGOODS) ×1 IMPLANT
KIT BASIN OR (CUSTOM PROCEDURE TRAY) ×2 IMPLANT
MANIFOLD NEPTUNE II (INSTRUMENTS) ×2 IMPLANT
NDL SAFETY ECLIPSE 18X1.5 (NEEDLE) ×2 IMPLANT
NEEDLE HYPO 18GX1.5 SHARP (NEEDLE) ×4
NS IRRIG 1000ML POUR BTL (IV SOLUTION) ×2 IMPLANT
PACK TOTAL JOINT (CUSTOM PROCEDURE TRAY) ×2 IMPLANT
PADDING CAST COTTON 6X4 STRL (CAST SUPPLIES) ×5 IMPLANT
POSITIONER SURGICAL ARM (MISCELLANEOUS) ×2 IMPLANT
SET HNDPC FAN SPRY TIP SCT (DISPOSABLE) ×1 IMPLANT
SPONGE GAUZE 4X4 12PLY (GAUZE/BANDAGES/DRESSINGS) ×2 IMPLANT
STRIP CLOSURE SKIN 1/2X4 (GAUZE/BANDAGES/DRESSINGS) ×4 IMPLANT
STRIP CLOSURE SKIN 1/4X4 (GAUZE/BANDAGES/DRESSINGS) ×2 IMPLANT
SUCTION FRAZIER 12FR DISP (SUCTIONS) ×2 IMPLANT
SUT MNCRL AB 4-0 PS2 18 (SUTURE) ×2 IMPLANT
SUT VIC AB 2-0 CT1 27 (SUTURE) ×6
SUT VIC AB 2-0 CT1 TAPERPNT 27 (SUTURE) ×3 IMPLANT
SUT VLOC 180 0 24IN GS25 (SUTURE) ×2 IMPLANT
SYR 20CC LL (SYRINGE) ×2 IMPLANT
SYR 50ML LL SCALE MARK (SYRINGE) ×2 IMPLANT
TOWEL OR 17X26 10 PK STRL BLUE (TOWEL DISPOSABLE) ×4 IMPLANT
TRAY FOLEY CATH 14FRSI W/METER (CATHETERS) ×2 IMPLANT
WATER STERILE IRR 1500ML POUR (IV SOLUTION) ×2 IMPLANT
WRAP KNEE MAXI GEL POST OP (GAUZE/BANDAGES/DRESSINGS) ×2 IMPLANT

## 2013-01-29 NOTE — Op Note (Signed)
Pre-operative diagnosis- Osteoarthritis Right knee(s)  Post-operative diagnosis- Osteoarthritis  Right knee(s)  Procedure-   Right Total Knee Arthroplasty  Surgeon- Gus Rankin. Esai Stecklein, MD  Assistant- Leilani Able, PA-C   Anesthesia-  Spinal   EBL- * No blood loss amount entered *   Drains Hemovac   Tourniquet time  Total Tourniquet Time Documented: Thigh (Right) - 41 minutes Total: Thigh (Right) - 41 minutes    Complications- None  Condition-PACU - hemodynamically stable.   Brief Clinical Note  [name] is a 72 y.o. year old female with end stage OA of her right knee with progressively worsening pain and dysfunction. She has constant pain, with activity and at rest and significant functional deficits with difficulties even with ADLs. She has had extensive non-op management including analgesics, injections of cortisone and viscosupplements, and home exercise program, but remains in significant pain with significant dysfunction.Radiographs show bone on bone arthritis lateral and patellofemoral. She presents now for right Total Knee Arthroplasty.   Procedure in detail---       The patient is brought into the operating room and positioned supine on the operating table. After successful administration of Spinal anesthetic, a tourniquet is placed high on the Right thigh(s) and the lower extremity is prepped and draped in the usual sterile fashion. Time out is performed by the operating team and then the Right  lower extremity is wrapped in Esmarch, knee flexed and the tourniquet inflated to 300 mmHg.       A midline incision is made with a ten blade through the subcutaneous tissue to the level of the extensor mechanism. A fresh blade is used to make a lateral parapatellar arthrotomy due to the patients' valgus deformity. Soft tissue over the proximal lateral tibia is subperiosteally elevated to the joint line with a knife to the posterolateral corner but not including the structures of  the posterolateral corner. Soft tissue over the proximal medial tibia is elevated with attention being paid to avoiding the patellar tendon on the tibial tubercle. The patella is everted medially, knee flexed 90 degrees and the ACL and PCL are removed. Findings are bone on bone lateral and patellofemoral .       The drill is used to create a starting hole in the distal femur and the canal is thoroughly irrigated with sterile saline to remove the fatty contents. The 5 degree Right  valgus alignment guide is placed into the femoral canal and the distal femoral cutting block is pinned to remove 10  mm off the distal femur. Resection is made with an oscillating saw.      The tibia is subluxed forward and the menisci are removed. The extramedullary alignment guide is placed referencing proximally at the medial aspect of the tibial tubercle and distally along the second metatarsal axis and tibial crest. The block is pinned to remove 2mm off the more deficient lateral side. Resection is made with an oscillating saw. Size 2.5  is the most appropriate size for the tibia and the proximal tibia is prepared with the modular drill and keel punch for that size.      The femoral sizing guide is placed and size 3  is most appropriate. Rotation is marked off the epicondylar axis and confirmed by creating a rectangular flexion gap at 90 degrees. The size 3  cutting block is pinned in this rotation and the anterior, posterior and chamfer cuts are made with the oscillating saw. The intercondylar block is then placed and that cut  is made.      Trial size 2.5  tibial component, trial size 3  posterior stabilized femur and a 10  mm posterior stabilized rotating platform insert trial is placed. Full extension is achieved with excellent varus/valgus and   anterior/posterior balance throughout full range of motion. The patella is everted and thickness measured to be 22  mm. Free hand resection is taken to 12 mm, a 35 template is placed,  lug holes are drilled, trial patella is placed, and it tracks normally. Osteophytes are removed off the posterior femur with the trial in place. All trials are removed and the cut bone surfaces prepared with pulsatile lavage. Cement is mixed and once ready for implantation, the size 2.5  tibial implant, size 3 posterior stabilized femoral component, and the size 35  patella are cemented in place and the patella is held with the clamp. The trial insert is placed and the knee held in full extension. The Exparel (20 ml mixed with 30 ml saline) and then 20 ml of .25% Bupivicaine is injected into the extensor mechanism, posterior capsule, medial and lateral gutters and subcutaneous tissues. All extruded cement is removed and once the cement is hard the permanent 10  mm posterior stabilized rotating platform insert is placed into the tibial tray.      The wound is copiously irrigated with saline solution and the tourniquet is released for a total   tourniquet time of 41  minutes. Bleeding is identified and controlled with electrocautery. The extensor mechanism is closed with interrupted #1 PDS leaving open a small area from the superior to inferior pole of the patella to serve as a mini lateral release. Flexion against gravity is 135  degrees and the patella tracks normally. Subcutaneous tissue is closed with 2.0 vicryl and subcuticular with running 4.0 Monocryl.The incision is cleaned and dried and steri-strips and a bulky sterile dressing are applied. The limb is placed into a knee immobilizer and the patient is awakened and transported to recovery in stable condition.      Please note that a surgical assistant was a medical necessity for this procedure in order to perform it in a safe and expeditious manner. Surgical assistant was necessary to retract the ligaments and vital neurovascular structures to prevent injury to them and also necessary for proper positioning of the limb to allow for anatomic placement of the  prosthesis.    Gus Rankin Tilford Deaton, MD    01/29/2013, 10:41 AM

## 2013-01-29 NOTE — Progress Notes (Signed)
Dental procedure last week.

## 2013-01-29 NOTE — Anesthesia Procedure Notes (Signed)
Spinal  Patient location during procedure: OR Start time: 01/29/2013 9:26 AM End time: 01/29/2013 9:30 AM Staffing Anesthesiologist: Lewie Loron R Performed by: anesthesiologist  Preanesthetic Checklist Completed: patient identified, site marked, surgical consent, pre-op evaluation, timeout performed, IV checked, risks and benefits discussed and monitors and equipment checked Spinal Block Patient position: sitting Prep: Betadine Patient monitoring: heart rate, continuous pulse ox and blood pressure Approach: midline Location: L3-4 Injection technique: single-shot Needle Needle type: Sprotte  Needle gauge: 24 G Needle length: 9 cm Assessment Sensory level: T8 Additional Notes Expiration date of kit checked and confirmed. Patient tolerated procedure well, without complications.

## 2013-01-29 NOTE — Progress Notes (Signed)
Utilization review completed.  

## 2013-01-29 NOTE — Anesthesia Preprocedure Evaluation (Addendum)
Anesthesia Evaluation  Patient identified by MRN, date of birth, ID band Patient awake    Reviewed: Allergy & Precautions, H&P , NPO status , Patient's Chart, lab work & pertinent test results  History of Anesthesia Complications Negative for: history of anesthetic complications  Airway Mallampati: II TM Distance: >3 FB Neck ROM: Full    Dental  (+) Dental Advisory Given and Implants   Pulmonary neg pulmonary ROS, asthma , pneumonia -, resolved,  breath sounds clear to auscultation        Cardiovascular hypertension, Pt. on medications negative cardio ROS  Rhythm:Regular Rate:Normal     Neuro/Psych negative neurological ROS  negative psych ROS   GI/Hepatic negative GI ROS, Neg liver ROS,   Endo/Other  negative endocrine ROS  Renal/GU Renal disease     Musculoskeletal negative musculoskeletal ROS (+)   Abdominal   Peds  Hematology negative hematology ROS (+)   Anesthesia Other Findings   Reproductive/Obstetrics negative OB ROS                          Anesthesia Physical Anesthesia Plan  ASA: II  Anesthesia Plan: Spinal   Post-op Pain Management:    Induction:   Airway Management Planned:   Additional Equipment:   Intra-op Plan:   Post-operative Plan:   Informed Consent: I have reviewed the patients History and Physical, chart, labs and discussed the procedure including the risks, benefits and alternatives for the proposed anesthesia with the patient or authorized representative who has indicated his/her understanding and acceptance.   Dental advisory given  Plan Discussed with: CRNA  Anesthesia Plan Comments:         Anesthesia Quick Evaluation

## 2013-01-29 NOTE — Transfer of Care (Signed)
Immediate Anesthesia Transfer of Care Note  Patient: [name]  Procedure(s) Performed: Procedure(s): RIGHT TOTAL KNEE ARTHROPLASTY (Right)  Patient Location: PACU  Anesthesia Type:General  Level of Consciousness: awake, alert  and oriented  Airway & Oxygen Therapy: Patient Spontanous Breathing and Patient connected to face mask oxygen  Post-op Assessment: Report given to PACU RN and Post -op Vital signs reviewed and stable  Post vital signs: Reviewed and stable  Complications: No apparent anesthesia complications

## 2013-01-29 NOTE — H&P (View-Only) (Signed)
[name]  DOB: 1940-11-06 Married / Language: English / Race: White Female  Date of Admission:  01/29/2013  Chief Complaint:  Right Knee Pain  History of Present Illness The patient is a 72 year old female who comes in for a preoperative History and Physical. The patient is scheduled for a right total knee arthroplasty to be performed by Dr. Gus Rankin. Aluisio, MD at [facility] on 01/29/2013. The patient is a 72 year old female who presents with knee complaints. The patient is seen in referral from Dr. Thomasena Miller for bilateral knees. The patient reports left knee and right knee symptoms including: pain . Prior to being seen today the patient was previously evaluated in this clinic (by Dr. Thomasena Miller). Previous work-up for this problem has included knee x-rays and arthroscopy (on 04/18/12). Past treatment for this problem has included intra-articular injection of corticosteroids (by Aggie Hacker on 09/18/12). Note for "Knee pain": She states the injections helped, but are already starting to wear off. She states that the knees are hurting her at all times now. They are really limiting what she can and can not do. Both hurt badly but the right is slightly worse than the left. She has had injections in the past which provided very short term benefit. She has had a left knee scope in the past and it did not provide a tremendous amount of benefit. The left knee was scoped in November of 2013. It does not hurt as much as the right one does now. She has had visco supplements also without much benefit. She is at a stage where the knees are preventing her from doing things that she desires. She is generally able to sleep at night but occasionally does get some knee pain that will wake her up. She is ready to go ahead and get the right knee fixed. They have been treated conservatively in the past for the above stated problem and despite conservative measures, they continue to have progressive pain  and severe functional limitations and dysfunction. They have failed non-operative management including home exercise, medications, and injections. It is felt that they would benefit from undergoing total joint replacement. Risks and benefits of the procedure have been discussed with the patient and they elect to proceed with surgery. There are no active contraindications to surgery such as ongoing infection or rapidly progressive neurological disease.   Problem List Osteoarthritis, Knee (715.96)  Allergies Codeine Derivatives   Family History Heart Disease. mother and father Severe allergy. mother Cerebrovascular Accident. grandfather mothers side Hypertension. mother   Social History Number of flights of stairs before winded. 4-5 Alcohol use. current drinker; drinks wine; less than 5 per week Children. 1 Current work status. retired Financial planner (Currently). no Tobacco use. never smoker Marital status. married Pain Contract. no Living situation. live with spouse Illicit drug use. no Drug/Alcohol Rehab (Previously). no Exercise. Exercises daily; does other, gym / weights and team sport   Medication History Propranolol HCl ER (80MG  Capsule ER 24HR, Oral) Active. Triamterene-HCTZ (37.5-25MG  Capsule, Oral) Active. Levocetirizine Dihydrochloride (5MG  Tablet, Oral) Active. CeleBREX (200MG  Capsule, 1 (one) Capsule Oral daily, Taken starting 12/07/2012) Active. Hydrocodone-Acetaminophen (5-325MG  Tablet, Oral) Active. (one per day) Aspirin EC (81MG  Tablet DR, Oral) Active. Premarin (0.625MG /GM Cream, Vaginal) Active. ALPRAZolam ( Oral) Specific dose unknown - Active. Symbicort ( Inhalation) Specific dose unknown - Active.   Past Surgical History Cataract Surgery. bilateral Dilation and Curettage of Uterus Foot Surgery. right   Medical History High blood pressure Diverticulitis Of  Colon Kidney Stone Asthma Skin  Cancer Osteoarthritis Shingles Cataract Bronchitis Pneumonia Diverticulosis Urinary Tract Infection Menopause Measles Eczema   Review of Systems General:Not Present- Chills, Fever, Night Sweats, Fatigue, Weight Gain, Weight Loss and Memory Loss. Skin:Not Present- Hives, Itching, Rash, Eczema and Lesions. HEENT:Not Present- Tinnitus, Headache, Double Vision, Visual Loss, Hearing Loss and Dentures. Respiratory:Not Present- Shortness of breath with exertion, Shortness of breath at rest, Allergies, Coughing up blood and Chronic Cough. Cardiovascular:Not Present- Chest Pain, Racing/skipping heartbeats, Difficulty Breathing Lying Down, Murmur, Swelling and Palpitations. Gastrointestinal:Not Present- Bloody Stool, Heartburn, Abdominal Pain, Vomiting, Nausea, Constipation, Diarrhea, Difficulty Swallowing, Jaundice and Loss of appetitie. Female Genitourinary:Not Present- Blood in Urine, Urinary frequency, Weak urinary stream, Discharge, Flank Pain, Incontinence, Painful Urination, Urgency, Urinary Retention and Urinating at Night. Musculoskeletal:Present- Joint Swelling and Joint Pain. Not Present- Muscle Weakness, Muscle Pain, Back Pain, Morning Stiffness and Spasms. Neurological:Not Present- Tremor, Dizziness, Blackout spells, Paralysis, Difficulty with balance and Weakness. Psychiatric:Not Present- Insomnia.   Vitals Weight: 153 lb Height: 61 in Weight was reported by patient. Height was reported by patient. Body Surface Area: 1.73 m Body Mass Index: 28.91 kg/m Pulse: 68 (Regular) Resp.: 14 (Unlabored) BP: 124/70 (Sitting, Right Arm, Standard)    Physical Exam The physical exam findings are as follows:   General Mental Status - Alert, cooperative and good historian. General Appearance- pleasant. Not in acute distress. Orientation- Oriented X3. Build & Nutrition- Well nourished and Well developed.   Head and Neck Head- normocephalic, atraumatic  . Neck Global Assessment- supple. no bruit auscultated on the right and no bruit auscultated on the left.   Eye Pupil- Bilateral- Regular and Round. Motion- Bilateral- EOMI.   Chest and Lung Exam Auscultation: Breath sounds:- clear at anterior chest wall and - clear at posterior chest wall. Adventitious sounds:- No Adventitious sounds.   Cardiovascular Auscultation:Rhythm- Regular rate and rhythm. Heart Sounds- S1 WNL and S2 WNL. Murmurs & Other Heart Sounds:Auscultation of the heart reveals - No Murmurs.   Abdomen Palpation/Percussion:Tenderness- Abdomen is non-tender to palpation. Rigidity (guarding)- Abdomen is soft. Auscultation:Auscultation of the abdomen reveals - Bowel sounds normal.   Female Genitourinary Not done, not pertinent to present illness  Musculoskeletal On exam she is alert and oriented in no apparent distress. Her knees show no effusion. Her range of motion of the right knee is about 5 to 125, left is similar. She has marked crepitus on range of motion both knees. She has tenderness medial greater than lateral with no instability on both knees. Pulses, sensation and motor are intact both lower extremities.  RADIOGRAPHS: Radiographs are reviewed from April, AP both knees and lateral, showing advanced arthritic changes in both knees with bone on bone in the medial and patellofemoral compartments of both. She also has some significant changes laterally on the right.  Assessment & Plan Primary osteoarthritis of both knees (715.16) Impression: Right Knee  Note: Plan is for a Right Total Knee Replacement by Dr. Lequita Halt.  Plan is to go home.  PCP - Dr. Zachery Dauer  The patient does not have any contraindications and will recieve TXA (tranexamic acid) prior to surgery.  Time spent ~ 40 minutes  Signed electronically by Lauraine Rinne, III PA-C

## 2013-01-29 NOTE — Evaluation (Signed)
Physical Therapy Evaluation Patient Details Name: [name] MRN: [id number] DOB: Apr 20, 1941 Today's Date: 01/29/2013 Time: 4098-1191 PT Time Calculation (min): 26 min  PT Assessment / Plan / Recommendation History of Present Illness  s/p right TKA  Clinical Impression  Pt will benefit from PT in the acute setting; plan is for home with HHPT; Pt needs RW    PT Assessment  Patient needs continued PT services    Follow Up Recommendations  Home health PT    Does the patient have the potential to tolerate intense rehabilitation      Barriers to Discharge        Equipment Recommendations  Rolling walker with 5" wheels    Recommendations for Other Services     Frequency 7X/week    Precautions / Restrictions Precautions Precautions: Knee Required Braces or Orthoses: Knee Immobilizer - Right Knee Immobilizer - Right: Discontinue once straight leg raise with < 10 degree lag Restrictions RLE Weight Bearing: Weight bearing as tolerated   Pertinent Vitals/Pain Pain R knee 1/10, premedicated      Mobility  Bed Mobility Bed Mobility: Supine to Sit Supine to Sit: 4: Min assist Details for Bed Mobility Assistance: cues for technique and assist with RLE Transfers Transfers: Sit to Stand;Stand to Sit Sit to Stand: 4: Min assist Stand to Sit: 4: Min assist Details for Transfer Assistance: verbal cues for hand placement and RLE management Ambulation/Gait Ambulation/Gait Assistance: 4: Min assist Ambulation Distance (Feet): 90 Feet Assistive device: Rolling walker Ambulation/Gait Assistance Details: cues for sequence and RW safety Gait Pattern: Step-to pattern;Antalgic    Exercises Total Joint Exercises Ankle Circles/Pumps: AROM;10 reps;Both Quad Sets: Both;5 reps;AROM   PT Diagnosis: Difficulty walking  PT Problem List: Decreased strength;Decreased range of motion;Decreased activity tolerance;Decreased balance;Decreased mobility;Decreased knowledge of use of DME PT  Treatment Interventions: Functional mobility training;Stair training;Gait training;DME instruction;Therapeutic activities;Therapeutic exercise;Patient/family education     PT Goals(Current goals can be found in the care plan section) Acute Rehab PT Goals Patient Stated Goal: decr pain PT Goal Formulation: With patient Time For Goal Achievement: 02/05/13 Potential to Achieve Goals: Good  Visit Information  Last PT Received On: 01/29/13 Assistance Needed: +1 History of Present Illness: s/p right TKA       Prior Functioning  Home Living Family/patient expects to be discharged to:: Private residence Living Arrangements: Spouse/significant other Available Help at Discharge: Family Type of Home: House Home Access: Stairs to enter Secretary/administrator of Steps: 2  Home Layout: One level Home Equipment: None Prior Function Level of Independence: Independent Communication Communication: No difficulties    Cognition  Cognition Arousal/Alertness: Awake/alert Behavior During Therapy: WFL for tasks assessed/performed Overall Cognitive Status: Within Functional Limits for tasks assessed    Extremity/Trunk Assessment Upper Extremity Assessment Upper Extremity Assessment: Overall WFL for tasks assessed Lower Extremity Assessment Lower Extremity Assessment: RLE deficits/detail RLE Deficits / Details: ankle WFL; able to assist with SLR RLE: Unable to fully assess due to pain   Balance    End of Session PT - End of Session Equipment Utilized During Treatment: Gait belt Activity Tolerance: Patient tolerated treatment well Patient left: in chair;with call bell/phone within reach Nurse Communication: Mobility status CPM Right Knee CPM Right Knee: Off  GP     [facility] 01/29/2013, 5:06 PM

## 2013-01-29 NOTE — Anesthesia Postprocedure Evaluation (Signed)
Anesthesia Post Note  Patient: [name]  Procedure(s) Performed: Procedure(s) (LRB): RIGHT TOTAL KNEE ARTHROPLASTY (Right)  Anesthesia type: General  Patient location: PACU  Post pain: Pain level controlled  Post assessment: Post-op Vital signs reviewed  Last Vitals: BP 141/79  Pulse 96  Temp(Src) 36.4 C (Oral)  Resp 14  Ht 5\' 1"  (1.549 m)  Wt 152 lb (68.947 kg)  BMI 28.74 kg/m2  SpO2 100%  Post vital signs: Reviewed  Level of consciousness: sedated  Complications: No apparent anesthesia complications

## 2013-01-29 NOTE — Interval H&P Note (Signed)
History and Physical Interval Note:  01/29/2013 7:10 AM  [name]  has presented today for surgery, with the diagnosis of OA RIGHT KNEE  The various methods of treatment have been discussed with the patient and family. After consideration of risks, benefits and other options for treatment, the patient has consented to  Procedure(s): RIGHT TOTAL KNEE ARTHROPLASTY (Right) as a surgical intervention .  The patient's history has been reviewed, patient examined, no change in status, stable for surgery.  I have reviewed the patient's chart and labs.  Questions were answered to the patient's satisfaction.     Loanne Drilling

## 2013-01-29 NOTE — Preoperative (Signed)
Beta Blockers   Reason not to administer Beta Blockers:Not Applicable 

## 2013-01-30 ENCOUNTER — Encounter (HOSPITAL_COMMUNITY): Payer: Self-pay | Admitting: Orthopedic Surgery

## 2013-01-30 LAB — BASIC METABOLIC PANEL
CO2: 26 mEq/L (ref 19–32)
Calcium: 8.5 mg/dL (ref 8.4–10.5)
GFR calc non Af Amer: 63 mL/min — ABNORMAL LOW (ref >90)
Glucose, Bld: 156 mg/dL — ABNORMAL HIGH (ref 70–99)
Potassium: 3.5 mEq/L (ref 3.5–5.1)
Sodium: 135 mEq/L (ref 135–145)

## 2013-01-30 LAB — CBC
Hemoglobin: 9.1 g/dL — ABNORMAL LOW (ref 12.0–15.0)
MCH: 32.7 pg (ref 26.0–34.0)
Platelets: 218 10*3/uL (ref 150–400)
RBC: 2.78 MIL/uL — ABNORMAL LOW (ref 3.87–5.11)

## 2013-01-30 MED ORDER — POTASSIUM GLUCONATE 595 (99 K) MG PO TABS
595.0000 mg | ORAL_TABLET | Freq: Every day | ORAL | Status: DC
  Administered 2013-01-30 – 2013-01-31 (×2): 595 mg via ORAL
  Filled 2013-01-30 (×2): qty 1

## 2013-01-30 NOTE — Plan of Care (Signed)
Problem: Phase II Progression Outcomes Goal: Discharge plan established Recommend HH OT for ADL trg and ADL mobility safety trg, 3 in 1

## 2013-01-30 NOTE — Progress Notes (Signed)
   Subjective: 1 Day Post-Op Procedure(s) (LRB): RIGHT TOTAL KNEE ARTHROPLASTY (Right) Patient reports pain as moderate.  "I had a miserable night.' Patient seen in rounds with Dr. Lequita Halt. Patient is having problems with pain in the knee, requiring pain medications We will start therapy today.  Plan is to go Home after hospital stay.  Objective: Vital signs in last 24 hours: Temp:  [96.7 F (35.9 C)-98 F (36.7 C)] 97.7 F (36.5 C) (08/26 0545) Pulse Rate:  [45-96] 74 (08/26 0545) Resp:  [8-20] 16 (08/26 0545) BP: (127-150)/(65-88) 138/79 mmHg (08/26 0545) SpO2:  [95 %-100 %] 100 % (08/26 0545) Weight:  [68.947 kg (152 lb)] 68.947 kg (152 lb) (08/25 1232)  Intake/Output from previous day:  Intake/Output Summary (Last 24 hours) at 01/30/13 0800 Last data filed at 01/30/13 0620  Gross per 24 hour  Intake 3786.25 ml  Output   2950 ml  Net 836.25 ml    Intake/Output this shift:    Labs:  Recent Labs  01/30/13 0425  HGB 9.1*    Recent Labs  01/30/13 0425  WBC 13.5*  RBC 2.78*  HCT 27.2*  PLT 218    Recent Labs  01/30/13 0425  NA 135  K 3.5  CL 100  CO2 26  BUN 19  CREATININE 0.91  GLUCOSE 156*  CALCIUM 8.5   No results found for this basename: LABPT, INR,  in the last 72 hours  EXAM General - Patient is Alert, Appropriate and Oriented Extremity - Neurovascular intact Sensation intact distally Dorsiflexion/Plantar flexion intact Dressing - dressing C/D/I Motor Function - intact, moving foot and toes well on exam.  Hemovac pulled without difficulty.  Past Medical History  Diagnosis Date  . Hypertension   . Asthma   . Complication of anesthesia     patient woke up during colonoscopy  . Anxiety   . Pneumonia     hx of   . Kidney stones     hx of   . Cancer     skin cancers, one melanoma   . Arthritis     Assessment/Plan: 1 Day Post-Op Procedure(s) (LRB): RIGHT TOTAL KNEE ARTHROPLASTY (Right) Principal Problem:   OA (osteoarthritis)  of knee  Estimated body mass index is 28.74 kg/(m^2) as calculated from the following:   Height as of this encounter: 5\' 1"  (1.549 m).   Weight as of this encounter: 68.947 kg (152 lb). Advance diet Up with therapy D/C IV fluids Plan for discharge tomorrow Discharge home with home health  DVT Prophylaxis - Xarelto Weight-Bearing as tolerated to right leg No vaccines. D/C O2 and Pulse OX and try on Room Air  [name] 01/30/2013, 8:00 AM

## 2013-01-30 NOTE — Progress Notes (Signed)
[facility]  [facility] is providing the following services: RW and Commode  If patient discharges after hours, please call [phone].   Renard Hamper 01/30/2013, 2:09 PM

## 2013-01-30 NOTE — Evaluation (Signed)
Occupational Therapy Evaluation Patient Details Name: [name] MRN: [id number] DOB: 12/24/1940 Today's Date: 01/30/2013 Time: 4098-1191 OT Time Calculation (min): 29 min  OT Assessment / Plan / Recommendation History of present illness s/p right TKA   Clinical Impression   Pt demos decline in function with ADLs and ADL mobility following R TKA. Pt would benefit from acute OT services to address impairments to help restore PLOF to return home safely    OT Assessment  Patient needs continued OT Services    Follow Up Recommendations  Home health OT;Supervision/Assistance - 24 hour    Barriers to Discharge   None  Equipment Recommendations  3 in 1 bedside comode;Other (comment) (ADL A/E)    Recommendations for Other Services    Frequency  Min 2X/week    Precautions / Restrictions Precautions Precautions: Knee Required Braces or Orthoses: Knee Immobilizer - Right Knee Immobilizer - Right: Discontinue once straight leg raise with < 10 degree lag Restrictions Weight Bearing Restrictions: No RLE Weight Bearing: Weight bearing as tolerated   Pertinent Vitals/Pain 6/10    ADL  Grooming: Performed;Wash/dry hands;Wash/dry face;Brushing hair;Min guard Where Assessed - Grooming: Supported standing Upper Body Bathing: Simulated;Supervision/safety;Set up Lower Body Bathing: Simulated;Moderate assistance Upper Body Dressing: Performed;Set up;Supervision/safety Lower Body Dressing: Performed;Moderate assistance Toilet Transfer: Performed;Minimal assistance Toilet Transfer Method: Sit to stand Toilet Transfer Equipment: Regular height toilet;Raised toilet seat with arms (or 3-in-1 over toilet) Toileting - Clothing Manipulation and Hygiene: Performed;Minimal assistance Where Assessed - Toileting Clothing Manipulation and Hygiene: Standing Tub/Shower Transfer Method: Not assessed Equipment Used: Gait belt;Rolling walker;Knee Immobilizer;Other (comment) (3 in  1) Transfers/Ambulation Related to ADLs: cues for correct hand placement ADL Comments: Pt and her spouse provided with education on DME for bathroom with pictures provided    OT Diagnosis: Generalized weakness;Acute pain  OT Problem List: Decreased strength;Decreased knowledge of use of DME or AE;Decreased activity tolerance;Pain;Impaired balance (sitting and/or standing) OT Treatment Interventions: Self-care/ADL training;Therapeutic exercise;Neuromuscular education;Balance training;Therapeutic activities;DME and/or AE instruction   OT Goals(Current goals can be found in the care plan section) Acute Rehab OT Goals Patient Stated Goal: " Get better and go home " Time For Goal Achievement: 02/06/13 Potential to Achieve Goals: Good ADL Goals Pt Will Perform Grooming: with set-up;with supervision;standing Pt Will Perform Lower Body Bathing: with min assist;with caregiver independent in assisting;with adaptive equipment Pt Will Perform Lower Body Dressing: with min assist;with caregiver independent in assisting;with adaptive equipment Pt Will Transfer to Toilet: with min guard assist;with supervision;grab bars Pt Will Perform Toileting - Clothing Manipulation and hygiene: with min guard assist;with caregiver independent in assisting;sitting/lateral leans;sit to/from stand Pt Will Perform Tub/Shower Transfer: with min guard assist;shower seat;ambulating  Visit Information  Last OT Received On: 01/30/13 Assistance Needed: +1 History of Present Illness: s/p right TKA       Prior Functioning     Home Living Family/patient expects to be discharged to:: Private residence Living Arrangements: Spouse/significant other Available Help at Discharge: Family Type of Home: House Home Access: Stairs to enter Secretary/administrator of Steps: 2  Home Layout: One level Home Equipment: Shower seat - built in Prior Function Level of Independence: Independent Communication Communication: No  difficulties Dominant Hand: Right         Vision/Perception Vision - History Baseline Vision: Wears glasses only for reading Patient Visual Report: No change from baseline Perception Perception: Within Functional Limits   Cognition  Cognition Arousal/Alertness: Awake/alert Behavior During Therapy: WFL for tasks assessed/performed Overall Cognitive Status: Within Functional Limits for tasks  assessed    Extremity/Trunk Assessment Upper Extremity Assessment Upper Extremity Assessment: Overall WFL for tasks assessed;Generalized weakness     Mobility Bed Mobility Bed Mobility: Not assessed Details for Bed Mobility Assistance: Pt in recliner upon entering room Transfers Transfers: Sit to Stand Sit to Stand: 4: Min assist;From chair/3-in-1;From toilet Stand to Sit: 4: Min assist;To chair/3-in-1;To toilet Details for Transfer Assistance: verbal cues for hand placement      Exercise     Balance Balance Balance Assessed: Yes Dynamic Standing Balance Dynamic Standing - Balance Support: During functional activity;No upper extremity supported Dynamic Standing - Level of Assistance: 4: Min assist   End of Session OT - End of Session Equipment Utilized During Treatment: Rolling walker;Right knee immobilizer Activity Tolerance: Patient tolerated treatment well Patient left: in chair;with call bell/phone within reach;with family/visitor present CPM Right Knee CPM Right Knee: Off  GO     Margaretmary Eddy [facility] 01/30/2013, 12:11 PM

## 2013-01-30 NOTE — Progress Notes (Signed)
Physical Therapy Treatment Patient Details Name: [name] MRN: [id number] DOB: 1940-09-06 Today's Date: 01/30/2013 Time: 7829-5621 PT Time Calculation (min): 27 min  PT Assessment / Plan / Recommendation  History of Present Illness s/p right TKA   PT Comments   POD # 1 R TKR am session.  On arrival found pt in middle of room with her walker but her R KI was loose and filled with 2 ICE packs.  Advised pt to call for assistance and instructed on safety with concern for falls.  Fixed KI and proceeded to amb pt in hallway.  Pt demon difficulty with her thoughts and demon decreased safety cognition needing repeat cueing during session.  Reported to RN and chair alarm was installed.   Follow Up Recommendations  Home health PT     Does the patient have the potential to tolerate intense rehabilitation     Barriers to Discharge        Equipment Recommendations  Rolling walker with 5" wheels    Recommendations for Other Services    Frequency 7X/week   Progress towards PT Goals Progress towards PT goals: Progressing toward goals  Plan      Precautions / Restrictions Precautions Precautions: Knee Precaution Comments: Instructed pt ON KI use for amb Required Braces or Orthoses: Knee Immobilizer - Right Knee Immobilizer - Right: Discontinue once straight leg raise with < 10 degree lag Restrictions Weight Bearing Restrictions: No RLE Weight Bearing: Weight bearing as tolerated    Pertinent Vitals/Pain C/o 5/10 Pre medicated ICE applied     Mobility  Bed Mobility Bed Mobility: Not assessed Details for Bed Mobility Assistance: Pt OOB amb self in room with/out any assistance Transfers Transfers: Stand to Sit Sit to Stand: 4: Min assist Stand to Sit: 4: Min assist;To chair/3-in-1;To toilet Details for Transfer Assistance: 75% VC's on safety and proper hand placement as pt demon decreased cognition and safety awareness Ambulation/Gait Ambulation/Gait Assistance: 4: Min  assist Ambulation Distance (Feet): 75 Feet Assistive device: Rolling walker Ambulation/Gait Assistance Details: 50% VC's on proper sequencing and proper walker to self distance.  Gait Pattern: Step-to pattern;Antalgic Gait velocity: decreased    Exercises   Total Knee Replacement TE's 10 reps B LE ankle pumps 10 reps knee presses 10 reps heel slides  10 reps SAQ's 10 reps SLR's 10 reps ABD Followed by ICE    PT Goals (current goals can now be found in the care plan section) Acute Rehab PT Goals Patient Stated Goal: " Get better and go home "  Visit Information  Last PT Received On: 01/30/13 Assistance Needed: +1 History of Present Illness: s/p right TKA    Subjective Data  Patient Stated Goal: " Get better and go home "   Cognition  Cognition Arousal/Alertness: Awake/alert Behavior During Therapy: WFL for tasks assessed/performed Overall Cognitive Status: Within Functional Limits for tasks assessed    Balance  Balance Balance Assessed: Yes Dynamic Standing Balance Dynamic Standing - Balance Support: During functional activity;No upper extremity supported Dynamic Standing - Level of Assistance: 4: Min assist  End of Session PT - End of Session Equipment Utilized During Treatment: Gait belt;Right knee immobilizer Activity Tolerance: Patient tolerated treatment well Patient left: in chair;with call bell/phone within reach;with chair alarm set Nurse Communication: Mobility status CPM Right Knee CPM Right Knee: Off   Felecia Shelling  PTA [facility]  Acute  Rehab Pager      [phone]

## 2013-01-30 NOTE — Progress Notes (Signed)
Physical Therapy Treatment Patient Details Name: [name] MRN: [id number] DOB: September 27, 1940 Today's Date: 01/30/2013 Time: 4098-1191 PT Time Calculation (min): 24 min  PT Assessment / Plan / Recommendation  History of Present Illness s/p right TKA   PT Comments   POD # 1 pm session. Chair alarm went off as pt was attempting to get up to go to the bathroom.  Advised pt to call for help as she was still "fuzzy"  And demon decreased cognition.  Assisted pt out of recliner to amb to BR, amb in hallway then positioned in recliner for lunch.    Follow Up Recommendations  Home health PT     Does the patient have the potential to tolerate intense rehabilitation     Barriers to Discharge        Equipment Recommendations  Rolling walker with 5" wheels    Recommendations for Other Services    Frequency 7X/week   Progress towards PT Goals Progress towards PT goals: Progressing toward goals  Plan      Precautions / Restrictions Precautions Precautions: Knee Precaution Comments: Instructed pt ON KI use for amb Required Braces or Orthoses: Knee Immobilizer - Right Knee Immobilizer - Right: Discontinue once straight leg raise with < 10 degree lag Restrictions Weight Bearing Restrictions: No RLE Weight Bearing: Weight bearing as tolerated    Pertinent Vitals/Pain C/o 3/10 ICE applied    Mobility  Bed Mobility Bed Mobility: Not assessed Details for Bed Mobility Assistance: Pt OOB in recliner Transfers Transfers: Sit to Stand;Stand to Sit Sit to Stand: 4: Min guard;4: Min assist;From chair/3-in-1;From toilet Stand to Sit: 4: Min assist;4: Min guard;To chair/3-in-1;To toilet Details for Transfer Assistance: Chair alarm went off, pt attempting to get up to BR without assist.  Advised pt she needed to call a staff member to prevent falls.  Assisted pt to BR with 75% VC's on safety issues and backward gait.  Ambulation/Gait Ambulation/Gait Assistance: 4: Min assist Ambulation  Distance (Feet): 82 Feet Assistive device: Rolling walker Ambulation/Gait Assistance Details: 50% VC's onsafety with turns and increased time as pt still demon "foggy" cognition.  Gait Pattern: Step-to pattern;Antalgic Gait velocity: decreased     PT Goals (current goals can now be found in the care plan section) Acute Rehab PT Goals Patient Stated Goal: " Get better and go home "  Visit Information  Last PT Received On: 01/30/13 Assistance Needed: +1 History of Present Illness: s/p right TKA    Subjective Data  Patient Stated Goal: " Get better and go home "   Cognition  Cognition Arousal/Alertness: Awake/alert Behavior During Therapy: WFL for tasks assessed/performed Overall Cognitive Status: Within Functional Limits for tasks assessed    Balance  Balance Balance Assessed: Yes Dynamic Standing Balance Dynamic Standing - Balance Support: During functional activity;No upper extremity supported Dynamic Standing - Level of Assistance: 4: Min assist  End of Session PT - End of Session Equipment Utilized During Treatment: Gait belt;Right knee immobilizer Activity Tolerance: Patient tolerated treatment well Patient left: in chair;with call bell/phone within reach;with chair alarm set Nurse Communication: Mobility status CPM Right Knee CPM Right Knee: Off   Felecia Shelling  PTA [facility]  Acute  Rehab Pager      [phone]

## 2013-01-31 LAB — BASIC METABOLIC PANEL
BUN: 18 mg/dL (ref 6–23)
CO2: 27 mEq/L (ref 19–32)
Chloride: 101 mEq/L (ref 96–112)
Glucose, Bld: 120 mg/dL — ABNORMAL HIGH (ref 70–99)
Potassium: 3.9 mEq/L (ref 3.5–5.1)

## 2013-01-31 LAB — CBC
HCT: 23.6 % — ABNORMAL LOW (ref 36.0–46.0)
Hemoglobin: 8 g/dL — ABNORMAL LOW (ref 12.0–15.0)
RBC: 2.37 MIL/uL — ABNORMAL LOW (ref 3.87–5.11)
WBC: 11.2 10*3/uL — ABNORMAL HIGH (ref 4.0–10.5)

## 2013-01-31 MED ORDER — OXYCODONE HCL 5 MG PO TABS
5.0000 mg | ORAL_TABLET | ORAL | Status: DC | PRN
Start: 2013-01-31 — End: 2014-10-28

## 2013-01-31 MED ORDER — POLYSACCHARIDE IRON COMPLEX 150 MG PO CAPS: 150.0000 mg | ORAL_CAPSULE | Freq: Two times a day (BID) | ORAL | Status: DC

## 2013-01-31 MED ORDER — POLYSACCHARIDE IRON COMPLEX 150 MG PO CAPS
150.0000 mg | ORAL_CAPSULE | Freq: Two times a day (BID) | ORAL | Status: DC
  Filled 2013-01-31 (×2): qty 1

## 2013-01-31 MED ORDER — METHOCARBAMOL 500 MG PO TABS: 500.0000 mg | ORAL_TABLET | Freq: Four times a day (QID) | ORAL | Status: DC | PRN

## 2013-01-31 MED ORDER — TRAMADOL HCL 50 MG PO TABS: 50.0000 mg | ORAL_TABLET | Freq: Four times a day (QID) | ORAL | Status: DC | PRN

## 2013-01-31 MED ORDER — RIVAROXABAN 10 MG PO TABS: 10.0000 mg | ORAL_TABLET | Freq: Every day | ORAL | Status: DC

## 2013-01-31 NOTE — Care Management Note (Signed)
    Page 1 of 2   01/31/2013     10:54:14 AM   CARE MANAGEMENT NOTE 01/31/2013  Patient:  [name], [name]   Account Number:  [id number]  Date Initiated:  01/30/2013  Documentation initiated by:  Colleen Can  Subjective/Objective Assessment:   dx rt knee OA: total knee replacemnt    Pre-arranged with Genevieve Norlander to provide HHpt services with start day of day after discharge.     Action/Plan:   CM spoke with patient. Plans are for patient to return to her home in [place] where will be caregiver. She will need RW and 3N1   Anticipated DC Date:  02/01/2013   Anticipated DC Plan:  HOME W HOME HEALTH SERVICES      DC Planning Services  CM consult      PAC Choice  DURABLE MEDICAL EQUIPMENT  HOME HEALTH   Choice offered to / List presented to:  C-1 Patient   DME arranged  3-N-1  Levan Hurst      DME agency  [facility]     [facility] arranged  HH-2 PT      [facility] agency  [facility]   Status of service:  In process, will continue to follow Medicare Important Message given?   (If response is "NO", the following Medicare IM given date fields will be blank) Date Medicare IM given:   Date Additional Medicare IM given:    Discharge Disposition:    Per UR Regulation:    If discussed at Long Length of Stay Meetings, dates discussed:    Comments:

## 2013-01-31 NOTE — Progress Notes (Signed)
Occupational Therapy Treatment Patient Details Name: [name] MRN: [id number] DOB: March 18, 1941 Today's Date: 01/31/2013 Time: 4098-1191 OT Time Calculation (min): 23 min  OT Assessment / Plan / Recommendation        Follow Up Recommendations  Home health OT;Supervision/Assistance - 24 hour       Equipment Recommendations  3 in 1 bedside comode;Other (comment) (ADL A/E)       Frequency Min 2X/week                ADL  Lower Body Dressing: Performed;Minimal assistance Where Assessed - Lower Body Dressing: Unsupported sit to stand Toilet Transfer: Performed;Min guard Toilet Transfer Method: Sit to Barista: Comfort height toilet Toileting - Clothing Manipulation and Hygiene: Performed;Set up Where Assessed - Toileting Clothing Manipulation and Hygiene: Standing Tub/Shower Transfer: Performed;Min guard;Other (comment) (walk in shower) Tub/Shower Transfer Method: Ambulating      OT Goals(current goals can now be found in the care plan section) Acute Rehab OT Goals Patient Stated Goal: " Get better and go home " Time For Goal Achievement: 02/06/13 Potential to Achieve Goals: Good  Visit Information  Last OT Received On: 01/31/13 Assistance Needed: +1          Cognition  Cognition Arousal/Alertness: Awake/alert Behavior During Therapy: WFL for tasks assessed/performed Overall Cognitive Status: Within Functional Limits for tasks assessed    Mobility  Bed Mobility Bed Mobility: Not assessed Details for Bed Mobility Assistance: Pt OOB in recliner Transfers Sit to Stand: From chair/3-in-1;From toilet;5: Supervision Stand to Sit: To chair/3-in-1;To toilet;5: Supervision          End of Session OT - End of Session Equipment Utilized During Treatment: Rolling walker;Right knee immobilizer Activity Tolerance: Patient tolerated treatment well Patient left: in chair;with call bell/phone within reach;with family/visitor present  GO      Alba Cory 01/31/2013, 12:37 PM

## 2013-01-31 NOTE — Progress Notes (Signed)
   Subjective: 2 Days Post-Op Procedure(s) (LRB): RIGHT TOTAL KNEE ARTHROPLASTY (Right) Patient reports pain as mild.   Patient seen in rounds with Dr. Lequita Halt. Patient is well, and has had no acute complaints or problems Patient is ready to go home  Objective: Vital signs in last 24 hours: Temp:  [97.8 F (36.6 C)-98.6 F (37 C)] 98.6 F (37 C) (08/27 0505) Pulse Rate:  [64-70] 64 (08/27 0938) Resp:  [16-18] 18 (08/27 0505) BP: (104-135)/(54-84) 123/82 mmHg (08/27 0938) SpO2:  [95 %-97 %] 96 % (08/27 0505)  Intake/Output from previous day:  Intake/Output Summary (Last 24 hours) at 01/31/13 1220 Last data filed at 01/31/13 0900  Gross per 24 hour  Intake    720 ml  Output   2750 ml  Net  -2030 ml    Intake/Output this shift: Total I/O In: 240 [P.O.:240] Out: 400 [Urine:400]  Labs:  Recent Labs  01/30/13 0425 01/31/13 0503  HGB 9.1* 8.0*    Recent Labs  01/30/13 0425 01/31/13 0503  WBC 13.5* 11.2*  RBC 2.78* 2.37*  HCT 27.2* 23.6*  PLT 218 205    Recent Labs  01/30/13 0425 01/31/13 0503  NA 135 136  K 3.5 3.9  CL 100 101  CO2 26 27  BUN 19 18  CREATININE 0.91 0.92  GLUCOSE 156* 120*  CALCIUM 8.5 8.9   No results found for this basename: LABPT, INR,  in the last 72 hours  EXAM: General - Patient is Alert and Appropriate Extremity - Neurovascular intact Sensation intact distally Incision - clean, dry, no drainage, healing Motor Function - intact, moving foot and toes well on exam.   Assessment/Plan: 2 Days Post-Op Procedure(s) (LRB): RIGHT TOTAL KNEE ARTHROPLASTY (Right) Procedure(s) (LRB): RIGHT TOTAL KNEE ARTHROPLASTY (Right) Past Medical History  Diagnosis Date  . Hypertension   . Asthma   . Complication of anesthesia     patient woke up during colonoscopy  . Anxiety   . Pneumonia     hx of   . Kidney stones     hx of   . Cancer     skin cancers, one melanoma   . Arthritis    Principal Problem:   OA (osteoarthritis) of  knee  Estimated body mass index is 28.74 kg/(m^2) as calculated from the following:   Height as of this encounter: 5\' 1"  (1.549 m).   Weight as of this encounter: 68.947 kg (152 lb). Discharge home with home health Diet - Cardiac diet Follow up - in 2 weeks Activity - WBAT Disposition - Home Condition Upon Discharge - Good D/C Meds - See DC Summary DVT Prophylaxis - Xarelto  Able Malloy 01/31/2013, 12:20 PM

## 2013-01-31 NOTE — Progress Notes (Signed)
Physical Therapy Treatment Patient Details Name: [name] MRN: [id number] DOB: 1940-09-02 Today's Date: 01/31/2013 Time: 4696-2952 PT Time Calculation (min): 39 min  PT Assessment / Plan / Recommendation  History of Present Illness s/p right TKA   PT Comments   POD # 2 am session.  Pt much more clear in cognition.  Amb in hallway, practice going up/down steps with spouse present.  Performed all TKR TE's and given handout.  Instructed on use of ICE.  Instructed to wear KI for side sleeping as pt like to keep R knee in flex position. Pt plans to D/C to home today.    Follow Up Recommendations  Home health PT     Does the patient have the potential to tolerate intense rehabilitation     Barriers to Discharge        Equipment Recommendations  Rolling walker with 5" wheels    Recommendations for Other Services    Frequency 7X/week   Progress towards PT Goals Progress towards PT goals: Progressing toward goals  Plan      Precautions / Restrictions Precautions Precautions: Knee Precaution Comments: instructed pt on KI use for side sleeping, D/C for amb Required Braces or Orthoses: Knee Immobilizer - Right Knee Immobilizer - Right: Discontinue once straight leg raise with < 10 degree lag Restrictions Weight Bearing Restrictions: No RLE Weight Bearing: Weight bearing as tolerated   Pertinent Vitals/Pain C/o 7/10 with TE's ICE applied    Mobility  Bed Mobility Bed Mobility: Not assessed Details for Bed Mobility Assistance: Pt OOB in recliner Transfers Transfers: Sit to Stand;Stand to Sit Sit to Stand: 5: Supervision;From chair/3-in-1 Stand to Sit: 5: Supervision;To chair/3-in-1 Details for Transfer Assistance: <25% VC's on proper tech and hand placement esp for stand to sit Ambulation/Gait Ambulation/Gait Assistance: 5: Supervision;4: Min guard Ambulation Distance (Feet): 85 Feet Assistive device: Rolling walker Ambulation/Gait Assistance Details: <25% VC's on  safety with turns and proper walker to self distance.  Gait Pattern: Step-to pattern;Antalgic Gait velocity: decreased    Exercises   Total Knee Replacement TE's 10 reps B LE ankle pumps 10 reps knee presses 10 reps heel slides  10 reps SAQ's 10 reps SLR's 10 reps ABD Followed by ICE   PT Goals (current goals can now be found in the care plan section) Acute Rehab PT Goals Patient Stated Goal: " Get better and go home "  Visit Information  Last PT Received On: 01/31/13 Assistance Needed: +1 History of Present Illness: s/p right TKA    Subjective Data  Patient Stated Goal: " Get better and go home "   Cognition  Cognition Arousal/Alertness: Awake/alert Behavior During Therapy: WFL for tasks assessed/performed Overall Cognitive Status: Within Functional Limits for tasks assessed    Balance     End of Session PT - End of Session Equipment Utilized During Treatment: Gait belt Activity Tolerance: Patient tolerated treatment well Patient left: in chair;with call bell/phone within reach   Felecia Shelling  PTA [facility]  Acute  Rehab Pager      [phone]

## 2013-01-31 NOTE — Discharge Summary (Signed)
Physician Discharge Summary   Patient ID: [name] MRN: [id number] DOB/AGE: [birth year] 72 y.o.  Admit date: 01/29/2013 Discharge date: 01/31/2013  Primary Diagnosis:  Osteoarthritis Right knee(s)  Admission Diagnoses:  Past Medical History  Diagnosis Date  . Hypertension   . Asthma   . Complication of anesthesia     patient woke up during colonoscopy  . Anxiety   . Pneumonia     hx of   . Kidney stones     hx of   . Cancer     skin cancers, one melanoma   . Arthritis    Discharge Diagnoses:   Principal Problem:   OA (osteoarthritis) of knee Active Problems:   Postoperative anemia due to acute blood loss  Estimated body mass index is 28.74 kg/(m^2) as calculated from the following:   Height as of this encounter: 5\' 1"  (1.549 m).   Weight as of this encounter: 68.947 kg (152 lb).  Procedure:  Procedure(s) (LRB): RIGHT TOTAL KNEE ARTHROPLASTY (Right)   Consults: None  HPI: [name] is a 72 y.o. year old female with end stage OA of her right knee with progressively worsening pain and dysfunction. She has constant pain, with activity and at rest and significant functional deficits with difficulties even with ADLs. She has had extensive non-op management including analgesics, injections of cortisone and viscosupplements, and home exercise program, but remains in significant pain with significant dysfunction.Radiographs show bone on bone arthritis lateral and patellofemoral. She presents now for right Total Knee Arthroplasty  Laboratory Data: Admission on 01/29/2013, Discharged on 01/31/2013  Component Date Value Range Status  . WBC 01/30/2013 13.5* 4.0 - 10.5 K/uL Final  . RBC 01/30/2013 2.78* 3.87 - 5.11 MIL/uL Final  . Hemoglobin 01/30/2013 9.1* 12.0 - 15.0 g/dL Final  . HCT 40/98/1191 27.2* 36.0 - 46.0 % Final  . MCV 01/30/2013 97.8  78.0 - 100.0 fL Final  . MCH 01/30/2013 32.7  26.0 - 34.0 pg Final  . MCHC 01/30/2013 33.5  30.0 - 36.0 g/dL Final  . RDW  47/82/9562 12.2  11.5 - 15.5 % Final  . Platelets 01/30/2013 218  150 - 400 K/uL Final  . Sodium 01/30/2013 135  135 - 145 mEq/L Final  . Potassium 01/30/2013 3.5  3.5 - 5.1 mEq/L Final  . Chloride 01/30/2013 100  96 - 112 mEq/L Final  . CO2 01/30/2013 26  19 - 32 mEq/L Final  . Glucose, Bld 01/30/2013 156* 70 - 99 mg/dL Final  . BUN 13/01/6577 19  6 - 23 mg/dL Final  . Creatinine, Ser 01/30/2013 0.91  0.50 - 1.10 mg/dL Final  . Calcium 46/96/2952 8.5  8.4 - 10.5 mg/dL Final  . GFR calc non Af Amer 01/30/2013 63* >90 mL/min Final  . GFR calc Af Amer 01/30/2013 73* >90 mL/min Final   Comment: (NOTE)                          The eGFR has been calculated using the CKD EPI equation.                          This calculation has not been validated in all clinical situations.                          eGFR's persistently <90 mL/min signify possible Chronic Kidney  Disease.  . WBC 01/31/2013 11.2* 4.0 - 10.5 K/uL Final  . RBC 01/31/2013 2.37* 3.87 - 5.11 MIL/uL Final  . Hemoglobin 01/31/2013 8.0* 12.0 - 15.0 g/dL Final  . HCT 40/98/1191 23.6* 36.0 - 46.0 % Final  . MCV 01/31/2013 99.6  78.0 - 100.0 fL Final  . MCH 01/31/2013 33.8  26.0 - 34.0 pg Final  . MCHC 01/31/2013 33.9  30.0 - 36.0 g/dL Final  . RDW 47/82/9562 12.5  11.5 - 15.5 % Final  . Platelets 01/31/2013 205  150 - 400 K/uL Final  . Sodium 01/31/2013 136  135 - 145 mEq/L Final  . Potassium 01/31/2013 3.9  3.5 - 5.1 mEq/L Final  . Chloride 01/31/2013 101  96 - 112 mEq/L Final  . CO2 01/31/2013 27  19 - 32 mEq/L Final  . Glucose, Bld 01/31/2013 120* 70 - 99 mg/dL Final  . BUN 13/01/6577 18  6 - 23 mg/dL Final  . Creatinine, Ser 01/31/2013 0.92  0.50 - 1.10 mg/dL Final  . Calcium 46/96/2952 8.9  8.4 - 10.5 mg/dL Final  . GFR calc non Af Amer 01/31/2013 62* >90 mL/min Final  . GFR calc Af Amer 01/31/2013 72* >90 mL/min Final   Comment: (NOTE)                          The eGFR has been calculated using the  CKD EPI equation.                          This calculation has not been validated in all clinical situations.                          eGFR's persistently <90 mL/min signify possible Chronic Kidney                          Disease.  Hospital Outpatient Visit on 01/22/2013  Component Date Value Range Status  . ABO/RH(D) 01/22/2013 O POS   Final  Hospital Outpatient Visit on 01/22/2013  Component Date Value Range Status  . aPTT 01/22/2013 30  24 - 37 seconds Final  . Sodium 01/22/2013 134* 135 - 145 mEq/L Final  . Potassium 01/22/2013 4.3  3.5 - 5.1 mEq/L Final  . Chloride 01/22/2013 94* 96 - 112 mEq/L Final  . CO2 01/22/2013 26  19 - 32 mEq/L Final  . Glucose, Bld 01/22/2013 80  70 - 99 mg/dL Final  . BUN 84/13/2440 24* 6 - 23 mg/dL Final  . Creatinine, Ser 01/22/2013 0.90  0.50 - 1.10 mg/dL Final  . Calcium 04/03/2535 10.1  8.4 - 10.5 mg/dL Final  . Total Protein 01/22/2013 7.4  6.0 - 8.3 g/dL Final  . Albumin 64/40/3474 3.9  3.5 - 5.2 g/dL Final  . AST 25/95/6387 19  0 - 37 U/L Final  . ALT 01/22/2013 10  0 - 35 U/L Final  . Alkaline Phosphatase 01/22/2013 60  39 - 117 U/L Final  . Total Bilirubin 01/22/2013 0.5  0.3 - 1.2 mg/dL Final  . GFR calc non Af Amer 01/22/2013 64* >90 mL/min Final  . GFR calc Af Amer 01/22/2013 74* >90 mL/min Final   Comment: (NOTE)                          The eGFR has been calculated using  the CKD EPI equation.                          This calculation has not been validated in all clinical situations.                          eGFR's persistently <90 mL/min signify possible Chronic Kidney                          Disease.  Marland Kitchen Prothrombin Time 01/22/2013 12.8  11.6 - 15.2 seconds Final  . INR 01/22/2013 0.98  0.00 - 1.49 Final  . ABO/RH(D) 01/22/2013 O POS   Final  . Antibody Screen 01/22/2013 NEG   Final  . Sample Expiration 01/22/2013 02/01/2013   Final  . Color, Urine 01/22/2013 YELLOW  YELLOW Final  . APPearance 01/22/2013 CLEAR  CLEAR Final  .  Specific Gravity, Urine 01/22/2013 1.011  1.005 - 1.030 Final  . pH 01/22/2013 6.5  5.0 - 8.0 Final  . Glucose, UA 01/22/2013 NEGATIVE  NEGATIVE mg/dL Final  . Hgb urine dipstick 01/22/2013 TRACE* NEGATIVE Final  . Bilirubin Urine 01/22/2013 NEGATIVE  NEGATIVE Final  . Ketones, ur 01/22/2013 NEGATIVE  NEGATIVE mg/dL Final  . Protein, ur 40/98/1191 NEGATIVE  NEGATIVE mg/dL Final  . Urobilinogen, UA 01/22/2013 0.2  0.0 - 1.0 mg/dL Final  . Nitrite 47/82/9562 NEGATIVE  NEGATIVE Final  . Leukocytes, UA 01/22/2013 NEGATIVE  NEGATIVE Final  . MRSA, PCR 01/22/2013 NEGATIVE  NEGATIVE Final  . Staphylococcus aureus 01/22/2013 NEGATIVE  NEGATIVE Final   Comment:                                 The Xpert SA Assay (FDA                          approved for NASAL specimens                          in patients over 22 years of age),                          is one component of                          a comprehensive surveillance                          program.  Test performance has                          been validated by [employer] for patients greater                          than or equal to 64 year old.                          It is not intended  to diagnose infection nor to                          guide or monitor treatment.  . Squamous Epithelial / LPF 01/22/2013 RARE  RARE Final  . WBC, UA 01/22/2013 0-2  <3 WBC/hpf Final  . RBC / HPF 01/22/2013 0-2  <3 RBC/hpf Final  . Bacteria, UA 01/22/2013 RARE  RARE Final     X-Rays:Dg Chest 2 View  01/22/2013   *RADIOLOGY REPORT*  Clinical Data: Hypertension.  Preop right knee surgery.  CHEST - 2 VIEW  Comparison: 02/20/2010  Findings: Heart size is normal.  There is no pleural effusion or edema.  There is no airspace consolidation identified.  Mild spondylosis is noted within the thoracic spine.  IMPRESSION:  1.  No acute cardiopulmonary abnormalities.   Original Report Authenticated By: Signa Kell, M.D.    EKG:No orders found for this or any previous visit.   Hospital Course: [name] is a 72 y.o. who was admitted to [facility]. They were brought to the operating room on 01/29/2013 and underwent Procedure(s): RIGHT TOTAL KNEE ARTHROPLASTY.  Patient tolerated the procedure well and was later transferred to the recovery room and then to the orthopaedic floor for postoperative care.  They were given PO and IV analgesics for pain control following their surgery.  They were given 24 hours of postoperative antibiotics of  Anti-infectives   Start     Dose/Rate Route Frequency Ordered Stop   01/29/13 1530  ceFAZolin (ANCEF) IVPB 1 g/50 mL premix     1 g 100 mL/hr over 30 Minutes Intravenous Every 6 hours 01/29/13 1252 01/29/13 2312   01/29/13 0645  ceFAZolin (ANCEF) IVPB 2 g/50 mL premix     2 g 100 mL/hr over 30 Minutes Intravenous On call to O.R. 01/29/13 1914 01/29/13 0930     and started on DVT prophylaxis in the form of Xarelto.   PT and OT were ordered for total joint protocol.  Discharge planning consulted to help with postop disposition and equipment needs.  Patient had a very rough night on the evening of surgery with pain.  They started to get up OOB with therapy on day one. Hemovac drain was pulled without difficulty.  Continued to work with therapy into day two.  Dressing was changed on day two and the incision was healing well.  Patient was seen in rounds and was ready to go home later that same day.   Discharge Medications: Prior to Admission medications   Medication Sig Start Date End Date Taking? Authorizing Provider  budesonide-formoterol (SYMBICORT) 160-4.5 MCG/ACT inhaler Inhale 2 puffs into the lungs daily as needed. For allergy symptoms    Yes Historical Provider, MD  celecoxib (CELEBREX) 200 MG capsule Take 200 mg by mouth daily.   Yes Historical Provider, MD  levocetirizine (XYZAL) 5 MG tablet Take 5 mg by mouth every evening.     Yes Historical  Provider, MD  Potassium 99 MG TABS Take 1 tablet by mouth daily.     Yes Historical Provider, MD  propranolol ER (INDERAL LA) 80 MG 24 hr capsule Take 80 mg by mouth daily.   Yes Historical Provider, MD  triamterene-hydrochlorothiazide (MAXZIDE-25) 37.5-25 MG per tablet Take 1 tablet by mouth every morning.    Yes Historical Provider, MD  methocarbamol (ROBAXIN) 500 MG tablet Take 1 tablet (500 mg total) by mouth every 6 (six) hours as needed. 01/31/13  Alexzandrew Perkins, PA-C  oxyCODONE (OXY IR/ROXICODONE) 5 MG immediate release tablet Take 1-2 tablets (5-10 mg total) by mouth every 3 (three) hours as needed. 01/31/13   Alexzandrew Julien Girt, PA-C  rivaroxaban (XARELTO) 10 MG TABS tablet Take 1 tablet (10 mg total) by mouth daily with breakfast. Take Xarelto for two and a half more weeks, then discontinue Xarelto. Once the patient has completed the Xarelto, they may resume the 81 mg Aspirin. 01/31/13   Alexzandrew Perkins, PA-C  traMADol (ULTRAM) 50 MG tablet Take 1-2 tablets (50-100 mg total) by mouth every 6 (six) hours as needed (mild pain). 01/31/13   Alexzandrew Julien Girt, PA-C    Diet: Cardiac diet Activity:WBAT Follow-up:in 2 weeks Disposition - Home Discharged Condition: good       Discharge Orders   Future Orders Complete By Expires   Call MD / Call 911  As directed    Comments:     If you experience chest pain or shortness of breath, CALL 911 and be transported to the hospital emergency room.  If you develope a fever above 101 F, pus (white drainage) or increased drainage or redness at the wound, or calf pain, call your surgeon's office.   Change dressing  As directed    Comments:     Change dressing daily with sterile 4 x 4 inch gauze dressing and apply TED hose. Do not submerge the incision under water.   Constipation Prevention  As directed    Comments:     Drink plenty of fluids.  Prune juice may be helpful.  You may use a stool softener, such as Colace (over the counter) 100  mg twice a day.  Use MiraLax (over the counter) for constipation as needed.   Diet - low sodium heart healthy  As directed    Discharge instructions  As directed    Comments:     Pick up stool softner and laxative for home. Do not submerge incision under water. May shower. Continue to use ice for pain and swelling from surgery.  Take Xarelto for two and a half more weeks, then discontinue Xarelto. Once the patient has completed the Xarelto, they may resume the 81 mg Aspirin.   Do not put a pillow under the knee. Place it under the heel.  As directed    Do not sit on low chairs, stoools or toilet seats, as it may be difficult to get up from low surfaces  As directed    Driving restrictions  As directed    Comments:     No driving until released by the physician.   Increase activity slowly as tolerated  As directed    Lifting restrictions  As directed    Comments:     No lifting until released by the physician.   Patient may shower  As directed    Comments:     You may shower without a dressing once there is no drainage.  Do not wash over the wound.  If drainage remains, do not shower until drainage stops.   TED hose  As directed    Comments:     Use stockings (TED hose) for 3 weeks on both leg(s).  You may remove them at night for sleeping.   Weight bearing as tolerated  As directed        Medication List    STOP taking these medications       aspirin EC 81 MG tablet     b complex vitamins tablet  cholecalciferol 1000 UNITS tablet  Commonly known as:  VITAMIN D     co-enzyme Q-10 30 MG capsule     conjugated estrogens vaginal cream  Commonly known as:  PREMARIN     folic acid 400 MCG tablet  Commonly known as:  FOLVITE     HYDROcodone-acetaminophen 10-325 MG per tablet  Commonly known as:  NORCO     milk thistle 175 MG tablet     NON FORMULARY     NONFORMULARY OR COMPOUNDED ITEM     omega-3 acid ethyl esters 1 G capsule  Commonly known as:  LOVAZA      oyster calcium 500 MG Tabs tablet     RED YEAST RICE PO      TAKE these medications       budesonide-formoterol 160-4.5 MCG/ACT inhaler  Commonly known as:  SYMBICORT  Inhale 2 puffs into the lungs daily as needed. For allergy symptoms     celecoxib 200 MG capsule  Commonly known as:  CELEBREX  Take 200 mg by mouth daily.     iron polysaccharides 150 MG capsule  Commonly known as:  NIFEREX  Take 1 capsule (150 mg total) by mouth 2 (two) times daily.     levocetirizine 5 MG tablet  Commonly known as:  XYZAL  Take 5 mg by mouth every evening.     methocarbamol 500 MG tablet  Commonly known as:  ROBAXIN  Take 1 tablet (500 mg total) by mouth every 6 (six) hours as needed.     oxyCODONE 5 MG immediate release tablet  Commonly known as:  Oxy IR/ROXICODONE  Take 1-2 tablets (5-10 mg total) by mouth every 3 (three) hours as needed.     Potassium 99 MG Tabs  Take 1 tablet by mouth daily.     propranolol ER 80 MG 24 hr capsule  Commonly known as:  INDERAL LA  Take 80 mg by mouth daily.     rivaroxaban 10 MG Tabs tablet  Commonly known as:  XARELTO  - Take 1 tablet (10 mg total) by mouth daily with breakfast. Take Xarelto for two and a half more weeks, then discontinue Xarelto.  - Once the patient has completed the Xarelto, they may resume the 81 mg Aspirin.     traMADol 50 MG tablet  Commonly known as:  ULTRAM  Take 1-2 tablets (50-100 mg total) by mouth every 6 (six) hours as needed (mild pain).     triamterene-hydrochlorothiazide 37.5-25 MG per tablet  Commonly known as:  MAXZIDE-25  Take 1 tablet by mouth every morning.       Follow-up Information   Follow up with Loanne Drilling, MD. Schedule an appointment as soon as possible for a visit in 2 weeks.   Specialty:  Orthopedic Surgery   Contact information:   [address] [place] [address] [phone]       Signed: Patrica Duel 02/15/2013, 10:01 AM

## 2013-02-15 DIAGNOSIS — D62 Acute posthemorrhagic anemia: Secondary | ICD-10-CM

## 2013-07-31 ENCOUNTER — Other Ambulatory Visit: Payer: Self-pay | Admitting: Gastroenterology

## 2014-10-01 ENCOUNTER — Other Ambulatory Visit: Payer: Self-pay | Admitting: Family Medicine

## 2014-10-01 ENCOUNTER — Other Ambulatory Visit (HOSPITAL_COMMUNITY)
Admission: RE | Admit: 2014-10-01 | Discharge: 2014-10-01 | Disposition: A | Discharge status: Home / Self Care | Payer: Medicare Other | Source: Ambulatory Visit | Attending: Family Medicine | Admitting: Family Medicine

## 2014-10-01 DIAGNOSIS — Z124 Encounter for screening for malignant neoplasm of cervix: Secondary | ICD-10-CM | POA: Diagnosis present

## 2014-10-03 LAB — CYTOLOGY - PAP

## 2014-10-28 ENCOUNTER — Emergency Department (HOSPITAL_BASED_OUTPATIENT_CLINIC_OR_DEPARTMENT_OTHER): Payer: Medicare Other

## 2014-10-28 ENCOUNTER — Emergency Department (HOSPITAL_BASED_OUTPATIENT_CLINIC_OR_DEPARTMENT_OTHER)
Admission: EM | Admit: 2014-10-28 | Discharge: 2014-10-28 | Disposition: A | Discharge status: Home / Self Care | Payer: Medicare Other | Attending: Emergency Medicine | Admitting: Emergency Medicine

## 2014-10-28 ENCOUNTER — Encounter (HOSPITAL_BASED_OUTPATIENT_CLINIC_OR_DEPARTMENT_OTHER): Payer: Self-pay | Admitting: *Deleted

## 2014-10-28 DIAGNOSIS — Y998 Other external cause status: Secondary | ICD-10-CM | POA: Insufficient documentation

## 2014-10-28 DIAGNOSIS — Z87442 Personal history of urinary calculi: Secondary | ICD-10-CM | POA: Insufficient documentation

## 2014-10-28 DIAGNOSIS — Z791 Long term (current) use of non-steroidal anti-inflammatories (NSAID): Secondary | ICD-10-CM | POA: Insufficient documentation

## 2014-10-28 DIAGNOSIS — Z85820 Personal history of malignant melanoma of skin: Secondary | ICD-10-CM | POA: Diagnosis not present

## 2014-10-28 DIAGNOSIS — M1990 Unspecified osteoarthritis, unspecified site: Secondary | ICD-10-CM | POA: Insufficient documentation

## 2014-10-28 DIAGNOSIS — S9032XA Contusion of left foot, initial encounter: Secondary | ICD-10-CM | POA: Insufficient documentation

## 2014-10-28 DIAGNOSIS — W010XXA Fall on same level from slipping, tripping and stumbling without subsequent striking against object, initial encounter: Secondary | ICD-10-CM | POA: Diagnosis not present

## 2014-10-28 DIAGNOSIS — Z8701 Personal history of pneumonia (recurrent): Secondary | ICD-10-CM | POA: Diagnosis not present

## 2014-10-28 DIAGNOSIS — S82432A Displaced oblique fracture of shaft of left fibula, initial encounter for closed fracture: Secondary | ICD-10-CM | POA: Insufficient documentation

## 2014-10-28 DIAGNOSIS — S8992XA Unspecified injury of left lower leg, initial encounter: Secondary | ICD-10-CM | POA: Insufficient documentation

## 2014-10-28 DIAGNOSIS — F419 Anxiety disorder, unspecified: Secondary | ICD-10-CM | POA: Insufficient documentation

## 2014-10-28 DIAGNOSIS — Z79899 Other long term (current) drug therapy: Secondary | ICD-10-CM | POA: Diagnosis not present

## 2014-10-28 DIAGNOSIS — Y9302 Activity, running: Secondary | ICD-10-CM | POA: Insufficient documentation

## 2014-10-28 DIAGNOSIS — Y9289 Other specified places as the place of occurrence of the external cause: Secondary | ICD-10-CM | POA: Insufficient documentation

## 2014-10-28 DIAGNOSIS — J45909 Unspecified asthma, uncomplicated: Secondary | ICD-10-CM | POA: Diagnosis not present

## 2014-10-28 DIAGNOSIS — I10 Essential (primary) hypertension: Secondary | ICD-10-CM | POA: Insufficient documentation

## 2014-10-28 DIAGNOSIS — S99912A Unspecified injury of left ankle, initial encounter: Secondary | ICD-10-CM | POA: Diagnosis present

## 2014-10-28 DIAGNOSIS — S82402A Unspecified fracture of shaft of left fibula, initial encounter for closed fracture: Secondary | ICD-10-CM

## 2014-10-28 MED ORDER — HYDROCODONE-ACETAMINOPHEN 5-325 MG PO TABS: 1.0000 | ORAL_TABLET | Freq: Four times a day (QID) | ORAL | Status: DC | PRN
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
## 2014-10-28 NOTE — Discharge Instructions (Signed)
Fibular Fracture, Ankle, Adult, Undisplaced, Treated With Immobilization °A simple fracture of the bone below the knee on the outside of your leg (fibula) usually heals without problems. °CAUSES °Typically, a fibular fracture occurs as a result of trauma. A blow to the side of your leg or a powerful twisting movement can cause a fracture. Fibular fractures are often seen as a result of football, soccer, or skiing injuries. °SYMPTOMS °Symptoms of a fibular fracture can include: °· Pain. °· Shortening or abnormal alignment of your lower leg (angulation). °DIAGNOSIS °A health care provider will need to examine the leg. X-ray exams will be ordered for further to confirm the fracture and evaluate the extent and of the injury. °TREATMENT  °Typically, a cast or immobilizer is applied. Sometimes a splint is placed on these fractures if it is needed for comfort or if the bones are badly out of place. Crutches may be needed to help you get around.  °HOME CARE INSTRUCTIONS  °· Apply ice to the injured area: °¨ Put ice in a plastic bag. °¨ Place a towel between your skin and the bag. °¨ Leave the ice on for 20 minutes, 2-3 times a day. °· Use crutches as directed. Resume walking without crutches as directed by your health care provider or when comfortable doing so. °· Only take over-the-counter or prescription medicines for pain, discomfort, or fever as directed by your health care provider. °· Keeping your leg raised may lessen swelling. °· If you have a removable splint or boot, do not remove the boot unless directed by your health care provider. °· Do not not drive a car or operate a motor vehicle until your health care provider specifically tells you it is safe to do so. °SEEK IMMEDIATE MEDICAL CARE IF:  °· Your cast gets damaged or breaks. °· You have continued severe pain or more swelling than you did before the cast was put on, or the pain is not controlled with medications. °· Your skin or nails below the injury turn  blue or grey, or feel cold or numb. °· There is a bad smell or pus coming from under the cast. °· You develop severe pain in ankle or foot. °MAKE SURE YOU:  °· Understand these instructions. °· Will watch your condition. °· Will get help right away if you are not doing well or get worse. °Document Released: 02/13/2002 Document Revised: 03/14/2013 Document Reviewed: 01/03/2013 °[employer]® Patient Information ©2015 [employer]. This information is not intended to replace advice given to you by your health care provider. Make sure you discuss any questions you have with your health care provider. ° °

## 2014-10-28 NOTE — ED Notes (Signed)
Patient transported to X-ray 

## 2014-11-12 ENCOUNTER — Other Ambulatory Visit: Payer: Self-pay | Admitting: Family Medicine

## 2014-11-15 ENCOUNTER — Other Ambulatory Visit: Payer: Self-pay | Admitting: Family Medicine

## 2016-06-03 ENCOUNTER — Emergency Department (HOSPITAL_COMMUNITY): Payer: Medicare Other

## 2016-06-03 ENCOUNTER — Emergency Department (HOSPITAL_COMMUNITY)
Admission: EM | Admit: 2016-06-03 | Discharge: 2016-06-03 | Disposition: A | Discharge status: Home / Self Care | Payer: Medicare Other | Attending: Emergency Medicine | Admitting: Emergency Medicine

## 2016-06-03 ENCOUNTER — Encounter (HOSPITAL_COMMUNITY): Payer: Self-pay | Admitting: Nurse Practitioner

## 2016-06-03 DIAGNOSIS — Z96651 Presence of right artificial knee joint: Secondary | ICD-10-CM | POA: Diagnosis not present

## 2016-06-03 DIAGNOSIS — I10 Essential (primary) hypertension: Secondary | ICD-10-CM | POA: Diagnosis not present

## 2016-06-03 DIAGNOSIS — J45901 Unspecified asthma with (acute) exacerbation: Secondary | ICD-10-CM | POA: Diagnosis not present

## 2016-06-03 DIAGNOSIS — R0602 Shortness of breath: Secondary | ICD-10-CM | POA: Diagnosis present

## 2016-06-03 DIAGNOSIS — Z85828 Personal history of other malignant neoplasm of skin: Secondary | ICD-10-CM | POA: Diagnosis not present

## 2016-06-03 LAB — CBC WITH DIFFERENTIAL/PLATELET
BASOS PCT: 0 %
Basophils Absolute: 0 10*3/uL (ref 0.0–0.1)
EOS ABS: 0 10*3/uL (ref 0.0–0.7)
EOS PCT: 0 %
HCT: 40.3 % (ref 36.0–46.0)
HEMOGLOBIN: 13.9 g/dL (ref 12.0–15.0)
Lymphocytes Relative: 10 %
Lymphs Abs: 1.1 10*3/uL (ref 0.7–4.0)
MCH: 32.3 pg (ref 26.0–34.0)
MCHC: 34.5 g/dL (ref 30.0–36.0)
MCV: 93.5 fL (ref 78.0–100.0)
Monocytes Absolute: 1.8 10*3/uL — ABNORMAL HIGH (ref 0.1–1.0)
Monocytes Relative: 17 %
Neutro Abs: 8 10*3/uL — ABNORMAL HIGH (ref 1.7–7.7)
Neutrophils Relative %: 73 %
PLATELETS: 251 10*3/uL (ref 150–400)
RBC: 4.31 MIL/uL (ref 3.87–5.11)
RDW: 12.5 % (ref 11.5–15.5)
WBC: 11 10*3/uL — AB (ref 4.0–10.5)

## 2016-06-03 LAB — BASIC METABOLIC PANEL
Anion gap: 11 (ref 5–15)
BUN: 15 mg/dL (ref 6–20)
CALCIUM: 9.6 mg/dL (ref 8.9–10.3)
CO2: 25 mmol/L (ref 22–32)
CREATININE: 0.8 mg/dL (ref 0.44–1.00)
Chloride: 95 mmol/L — ABNORMAL LOW (ref 101–111)
GFR calc Af Amer: >60 mL/min (ref >60)
GFR calc non Af Amer: >60 mL/min (ref >60)
Glucose, Bld: 105 mg/dL — ABNORMAL HIGH (ref 65–99)
Potassium: 3.8 mmol/L (ref 3.5–5.1)
SODIUM: 131 mmol/L — AB (ref 135–145)

## 2016-06-03 LAB — TROPONIN I: Troponin I: <0.03 ng/mL (ref <0.03)

## 2016-06-03 MED ORDER — PREDNISONE 20 MG PO TABS
40.0000 mg | ORAL_TABLET | Freq: Once | ORAL | Status: AC
  Administered 2016-06-03: 40 mg via ORAL
  Filled 2016-06-03: qty 2

## 2016-06-03 MED ORDER — IPRATROPIUM-ALBUTEROL 0.5-2.5 (3) MG/3ML IN SOLN
3.0000 mL | Freq: Once | RESPIRATORY_TRACT | Status: AC
  Administered 2016-06-03: 3 mL via RESPIRATORY_TRACT
  Filled 2016-06-03: qty 3

## 2016-06-03 MED ORDER — ALBUTEROL SULFATE HFA 108 (90 BASE) MCG/ACT IN AERS
2.0000 | INHALATION_SPRAY | Freq: Once | RESPIRATORY_TRACT | Status: AC
  Administered 2016-06-03: 2 via RESPIRATORY_TRACT
  Filled 2016-06-03: qty 6.7

## 2016-06-03 MED ORDER — ALBUTEROL SULFATE HFA 108 (90 BASE) MCG/ACT IN AERS: 2.0000 | INHALATION_SPRAY | RESPIRATORY_TRACT | 0 refills | Status: DC | PRN

## 2016-06-03 NOTE — ED Notes (Signed)
Bed: WA02 Expected date:  Expected time:  Means of arrival:  Comments: 

## 2016-06-03 NOTE — ED Provider Notes (Signed)
Emergency Department Provider Note   I have reviewed the triage vital signs and the nursing notes.   HISTORY  Chief Complaint No chief complaint on file.   HPI [name] is a 75 y.o. female with PMH of asthma, HTN, kidney stones presents to the emergency room in for evaluation of difficulty breathing, productive cough, and wheezing. The patient states that her husband has similar symptoms and a fever. She denies fever at home but seemed to have progressively worsening difficulty breathing. She's been taking Mucinex but her symptoms have not improved. She states that she had a prescription for albuterol in the distant past but has not needed it for several years. She is not a smoker. She denies any chest pain today but did have some mild chest discomfort yesterday. The pain is in the center of her chest and nonradiating. No exacerbating or alleviating factors.  This morning the patient called EMS because she was reporting significant difficulty breathing without pain. She got an albuterol nebulizer in route and is feeling much better.   Past Medical History:  Diagnosis Date  . Anxiety   . Arthritis   . Asthma   . Cancer ([place])    skin cancers, one melanoma   . Complication of anesthesia    patient woke up during colonoscopy  . Hypertension   . Kidney stones    hx of   . Pneumonia    hx of     Patient Active Problem List   Diagnosis Date Noted  . Postoperative anemia due to acute blood loss 02/15/2013  . OA (osteoarthritis) of knee 01/29/2013    Past Surgical History:  Procedure Laterality Date  . BUNIONECTOMY     left   . EYE SURGERY     [place] cataract surgery   . KNEE ARTHROSCOPY     left   . polyp removed from uterus     . right wrist pinning     . 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  Order #: [id number] Class: Historical Med  . Order #: [id number] Class: Historical Med  . Order #: [id number] Class: Historical Med  . Order #: [id number] Class: Historical Med  . Order #: [id number] Class: Historical Med  . Order #: [id number] Class: Historical Med  . Order #: [id number] Class: Historical Med  . Order #: [id number] Class: Historical Med  . Order #: [id number] Class: Historical Med  . Order #: [id number] Class: Historical Med  . Order #: [id number] Class: Historical Med  . Order #: [id number] Class: Print  . Order #: [id number] Class: Historical Med  . Order #: [id number] Class: Print  . Order #: [id number] Class: Historical Med    Allergies Codeine  History reviewed. No pertinent family history.  Social History Social History  Substance Use Topics  . Smoking status: Never Smoker  . Smokeless tobacco: Never Used  . Alcohol use Yes     Comment: champagne with meals     Review of Systems  Constitutional: No fever/chills Eyes: No visual changes. ENT: No sore throat. Cardiovascular: Positive chest pain. Respiratory: Positive shortness of breath and cough.  Gastrointestinal: No abdominal pain.  No nausea, no vomiting.  No diarrhea.  No constipation. Genitourinary: Negative for dysuria. Musculoskeletal: Negative for back pain. Skin: Negative for rash. Neurological: Negative for headaches, focal weakness or numbness.  10-point ROS otherwise negative.  ____________________________________________   PHYSICAL EXAM:  VITAL SIGNS: ED Triage Vitals  Enc Vitals Group     BP 06/03/16 1100 152/91     Pulse Rate 06/03/16 1058 67     Resp 06/03/16 1058 18     Temp 06/03/16 1100 98 F (36.7 C)     Temp Source 06/03/16 1100 Oral     SpO2 06/03/16 1058 90 %     Weight 06/03/16 1058 145 lb (65.8 kg)     Height 06/03/16 1058 5' (1.524 m)   Constitutional: Alert and oriented. Well appearing and in no acute distress. Eyes: Conjunctivae are normal. Head: Atraumatic. Nose: No  congestion/rhinnorhea. Mouth/Throat: Mucous membranes are moist.  Oropharynx non-erythematous. Neck: No stridor.   Cardiovascular: Normal rate, regular rhythm. Good peripheral circulation. Grossly normal heart sounds.   Respiratory: Normal respiratory effort.  No retractions. Lungs with diffuse expiratory wheezing throughout.  Gastrointestinal: Soft and nontender. No distention.  Musculoskeletal: No lower extremity tenderness nor edema. No gross deformities of extremities. Neurologic:  Normal speech and language. No gross focal neurologic deficits are appreciated.  Skin:  Skin is warm, dry and intact. No rash noted.  ____________________________________________   LABS (all labs ordered are listed, but only abnormal results are displayed)  Labs Reviewed  BASIC METABOLIC PANEL - Abnormal; Notable for the following:       Result Value   Sodium 131 (*)    Chloride 95 (*)    Glucose, Bld 105 (*)    All other components within normal limits  CBC WITH DIFFERENTIAL/PLATELET - Abnormal; Notable for the following:    WBC 11.0 (*)    Neutro Abs 8.0 (*)    Monocytes Absolute 1.8 (*)    All other components within normal limits  TROPONIN I   ____________________________________________  EKG   EKG Interpretation  Date/Time:  Thursday June 03 2016 10:57:41 EST Ventricular Rate:  67 PR Interval:    QRS Duration: 97 QT Interval:  402 QTC Calculation: 425 R Axis:   57 Text Interpretation:  Sinus rhythm Minimal ST depression, diffuse leads Artifact since last tracing no significant change Confirmed by Eulis Foster  MD, ELLIOTT [phone]) on 06/03/2016 12:50:10 PM       ____________________________________________  RADIOLOGY  Dg Chest 2 View  Result Date: 06/03/2016 CLINICAL DATA:  Cough, congestion, worsening shortness of breath, history of asthma EXAM: CHEST  2 VIEW COMPARISON:  Chest x-ray of 04/03/2015 FINDINGS: No active infiltrate or effusion is seen. Mediastinal and hilar contours  are unremarkable. The heart is within upper limits of normal and stable. There are degenerative changes in the mid to lower thoracic spine. IMPRESSION: No active cardiopulmonary disease. Electronically Signed   By: Ivar Drape M.D.   On: 06/03/2016 11:41    ____________________________________________   PROCEDURES  Procedure(s) performed:   Procedures  None ____________________________________________   INITIAL IMPRESSION / ASSESSMENT AND PLAN / ED COURSE  Pertinent labs & imaging results that were available during my care of the patient were reviewed by me and considered in my medical decision making (see chart for details).  Patient resents emergency room in for evaluation of difficulty breathing and productive cough over the past 3 days. Patient responded well to albuterol nebulizer given by EMS in route. She is afebrile here. She is breathing comfortably but has significant expiratory wheezing. Plan for additional nebulizer, labs, EKG (with CP yesterday), and CXR.   03:49 PM Repeat troponin negative. Plan for discharge. She is on steroids already so will continue that course. Will give albuterol inh at  discharge.   At this time, I do not feel there is any life-threatening condition present. I have reviewed and discussed all results (EKG, imaging, lab, urine as appropriate), exam findings with patient. I have reviewed nursing notes and appropriate previous records.  I feel the patient is safe to be discharged home without further emergent workup. Discussed usual and customary return precautions. Patient and family (if present) verbalize understanding and are comfortable with this plan.  Patient will follow-up with their primary care provider. If they do not have a primary care provider, information for follow-up has been provided to them. All questions have been answered.  ____________________________________________  FINAL CLINICAL IMPRESSION(S) / ED DIAGNOSES  Final diagnoses:    Moderate asthma with exacerbation, unspecified whether persistent     MEDICATIONS GIVEN DURING THIS VISIT:  Medications  ipratropium-albuterol (DUONEB) 0.5-2.5 (3) MG/3ML nebulizer solution 3 mL (3 mLs Nebulization Given 06/03/16 1304)  predniSONE (DELTASONE) tablet 40 mg (40 mg Oral Given 06/03/16 1332)  albuterol (PROVENTIL HFA;VENTOLIN HFA) 108 (90 Base) MCG/ACT inhaler 2 puff (2 puffs Inhalation Given 06/03/16 1624)     NEW OUTPATIENT MEDICATIONS STARTED DURING THIS VISIT:  Discharge Medication List as of 06/03/2016  3:56 PM    START taking these medications   Details  albuterol (PROVENTIL HFA;VENTOLIN HFA) 108 (90 Base) MCG/ACT inhaler Inhale 2 puffs into the lungs every 4 (four) hours as needed for wheezing or shortness of breath., Starting Thu 06/03/2016, Print         Note:  This document was prepared using Dragon voice recognition software and may include unintentional dictation errors.  Nanda Quinton, MD Emergency Medicine   Margette Fast, MD 06/03/16 [phone]

## 2016-06-03 NOTE — Discharge Instructions (Signed)
We believe that your symptoms are caused today by an exacerbation of your asthma.  Please take the prescribed medications and any medications that you have at home.  Follow up with your doctor as recommended.  If you develop any new or worsening symptoms, including but not limited to fever, persistent vomiting, worsening shortness of breath, or other symptoms that concern you, please return to the Emergency Department immediately. ° ° °Asthma °Asthma is a recurring condition in which the airways tighten and narrow. Asthma can make it difficult to breathe. It can cause coughing, wheezing, and shortness of breath. Asthma episodes, also called asthma attacks, range from minor to life-threatening. Asthma cannot be cured, but medicines and lifestyle changes can help control it. °CAUSES °Asthma is believed to be caused by inherited (genetic) and environmental factors, but its exact cause is unknown. Asthma may be triggered by allergens, lung infections, or irritants in the air. Asthma triggers are different for each person. Common triggers include:  °Animal dander. °Dust mites. °Cockroaches. °Pollen from trees or grass. °Mold. °Smoke. °Air pollutants such as dust, household cleaners, hair sprays, aerosol sprays, paint fumes, strong chemicals, or strong odors. °Cold air, weather changes, and winds (which increase molds and pollens in the air). °Strong emotional expressions such as crying or laughing hard. °Stress. °Certain medicines (such as aspirin) or types of drugs (such as beta-blockers). °Sulfites in foods and drinks. Foods and drinks that may contain sulfites include dried fruit, potato chips, and sparkling grape juice. °Infections or inflammatory conditions such as the flu, a cold, or an inflammation of the nasal membranes (rhinitis). °Gastroesophageal reflux disease (GERD). °Exercise or strenuous activity. °SYMPTOMS °Symptoms may occur immediately after asthma is triggered or many hours later. Symptoms  include: °Wheezing. °Excessive nighttime or early morning coughing. °Frequent or severe coughing with a common cold. °Chest tightness. °Shortness of breath. °DIAGNOSIS  °The diagnosis of asthma is made by a review of your medical history and a physical exam. Tests may also be performed. These may include: °Lung function studies. These tests show how much air you breathe in and out. °Allergy tests. °Imaging tests such as X-rays. °TREATMENT  °Asthma cannot be cured, but it can usually be controlled. Treatment involves identifying and avoiding your asthma triggers. It also involves medicines. There are 2 classes of medicine used for asthma treatment:  °Controller medicines. These prevent asthma symptoms from occurring. They are usually taken every day. °Reliever or rescue medicines. These quickly relieve asthma symptoms. They are used as needed and provide short-term relief. °Your health care provider will help you create an asthma action plan. An asthma action plan is a written plan for managing and treating your asthma attacks. It includes a list of your asthma triggers and how they may be avoided. It also includes information on when medicines should be taken and when their dosage should be changed. An action plan may also involve the use of a device called a peak flow meter. A peak flow meter measures how well the lungs are working. It helps you monitor your condition. °HOME CARE INSTRUCTIONS  °Take medicines only as directed by your health care provider. Speak with your health care provider if you have questions about how or when to take the medicines. °Use a peak flow meter as directed by your health care provider. Record and keep track of readings. °Understand and use the action plan to help minimize or stop an asthma attack without needing to seek medical care. °Control your home environment in the following   ways to help prevent asthma attacks: °Do not smoke. Avoid being exposed to secondhand smoke. °Change  your heating and air conditioning filter regularly. °Limit your use of fireplaces and wood stoves. °Get rid of pests (such as roaches and mice) and their droppings. °Throw away plants if you see mold on them. °Clean your floors and dust regularly. Use unscented cleaning products. °Try to have someone else vacuum for you regularly. Stay out of rooms while they are being vacuumed and for a short while afterward. If you vacuum, use a dust mask from a hardware store, a double-layered or microfilter vacuum cleaner bag, or a vacuum cleaner with a HEPA filter. °Replace carpet with wood, tile, or vinyl flooring. Carpet can trap dander and dust. °Use allergy-proof pillows, mattress covers, and box spring covers. °Wash bed sheets and blankets every week in hot water and dry them in a dryer. °Use blankets that are made of polyester or cotton. °Clean bathrooms and kitchens with bleach. If possible, have someone repaint the walls in these rooms with mold-resistant paint. Keep out of the rooms that are being cleaned and painted. °Wash hands frequently. °SEEK MEDICAL CARE IF:  °You have wheezing, shortness of breath, or a cough even if taking medicine to prevent attacks. °The colored mucus you cough up (sputum) is thicker than usual. °Your sputum changes from clear or white to yellow, green, gray, or bloody. °You have any problems that may be related to the medicines you are taking (such as a rash, itching, swelling, or trouble breathing). °You are using a reliever medicine more than 2-3 times per week. °Your peak flow is still at 50-79% of your personal best after following your action plan for 1 hour. °You have a fever. °SEEK IMMEDIATE MEDICAL CARE IF:  °You seem to be getting worse and are unresponsive to treatment during an asthma attack. °You are short of breath even at rest. °You get short of breath when doing very little physical activity. °You have difficulty eating, drinking, or talking due to asthma symptoms. °You  develop chest pain. °You develop a fast heartbeat. °You have a bluish color to your lips or fingernails. °You are light-headed, dizzy, or faint. °Your peak flow is less than 50% of your personal best. °MAKE SURE YOU:  °Understand these instructions. °Will watch your condition. °Will get help right away if you are not doing well or get worse. °Document Released: 05/24/2005 Document Revised: 10/08/2013 Document Reviewed: 12/21/2012 °[employer]® Patient Information ©2015 [employer]. This information is not intended to replace advice given to you by your health care provider. Make sure you discuss any questions you have with your health care provider. ° °How to Use a Nebulizer °If you have asthma or other breathing problems, you might need to breathe in (inhale) medicine. This can be done with a nebulizer. A nebulizer is a device that turns liquid medicine into a mist that you can inhale.  °There are different kinds of nebulizers. Most are small. With some, you breathe in through a mouthpiece. With others, a mask fits over your nose and mouth. Most nebulizers must be connected to a small air compressor. Air is forced through tubing from the compressor to the nebulizer. The forced air changes the liquid into a fine spray. °RISKS AND COMPLICATIONS °The nebulizer must work properly for it to help your breathing. If the nebulizer does not produce mist, or if foam comes out, this indicates that the nebulizer is not working properly. Sometimes a filter can get clogged, or   there might be a problem with the air compressor. Check the instruction booklet that came with your nebulizer. It should tell you how to fix problems or where to call for help. You should have at least one extra nebulizer at home. That way, you will always have one when you need it.  °HOW TO PREPARE BEFORE USING THE NEBULIZER °Take these steps before using the nebulizer: °Check your medicine. Make sure it has not expired and is not damaged in any way.    °Wash your hands with soap and water.   °Put all the parts of your nebulizer on a sturdy, flat surface. Make sure the tubing connects the compressor and the nebulizer. °Measure the liquid medicine according to your health care provider's instructions. Pour it into the nebulizer. °Attach the mouthpiece or mask.   °Test the nebulizer by turning it on to make sure a spray is coming out. Then, turn it off.   °HOW TO USE THE NEBULIZER °Sit down and focus on staying relaxed.   °If your nebulizer has a mask, put it over your nose and mouth. If you use a mouthpiece, put it in your mouth. Press your lips firmly around the mouthpiece. °Turn on the nebulizer.   °Breathe out.   °Some nebulizers have a finger valve. If yours does, cover up the air hole so the air gets to the nebulizer. °Once the medicine begins to mist out, take slow, deep breaths. If there is a finger valve, release it at the end of your breath. °Continue taking slow, deep breaths until the nebulizer is empty.   °Be sure to stop the machine at any point if you start coughing or if the medicine foams or bubbles. °HOW TO CLEAN THE NEBULIZER  °The nebulizer and all its parts must be kept very clean. Follow the manufacturer's instructions for cleaning. For most nebulizers, you should follow these guidelines: °Wash the nebulizer after each use. Use warm water and soap. Rinse it well. Shake the nebulizer to remove extra water. Put it on a clean towel until it is completely dry. To make sure it is dry, put the nebulizer back together. Turn on the compressor for a few minutes. This will blow air through the nebulizer.   °Do not wash the tubing or the finger valve.   °Store the nebulizer in a dust-free place.   °Inspect the filter every week. Replace it any time it looks dirty.   °Sometimes the nebulizer will need a more complete cleaning. The instruction booklet should say how often you need to do this. °SEEK MEDICAL CARE IF:  °You continue to have difficulty  breathing.   °You have trouble using the nebulizer.   °Document Released: 05/12/2009 Document Revised: 10/08/2013 Document Reviewed: 11/13/2012 °[employer]® Patient Information ©2015 [employer]. This information is not intended to replace advice given to you by your health care provider. Make sure you discuss any questions you have with your health care provider. ° °How to Use an Inhaler °Proper inhaler technique is very important. Good technique ensures that the medicine reaches the lungs. Poor technique results in depositing the medicine on the tongue and back of the throat rather than in the airways. If you do not use the inhaler with good technique, the medicine will not help you. °STEPS TO FOLLOW IF USING AN INHALER WITHOUT AN EXTENSION TUBE °Remove the cap from the inhaler. °If you are using the inhaler for the first time, you will need to prime it. Shake the inhaler for   5 seconds and release four puffs into the air, away from your face. Ask your health care provider or pharmacist if you have questions about priming your inhaler. °Shake the inhaler for 5 seconds before each breath in (inhalation). °Position the inhaler so that the top of the canister faces up. °Put your index finger on the top of the medicine canister. Your thumb supports the bottom of the inhaler. °Open your mouth. °Either place the inhaler between your teeth and place your lips tightly around the mouthpiece, or hold the inhaler 1-2 inches away from your open mouth. If you are unsure of which technique to use, ask your health care provider. °Breathe out (exhale) normally and as completely as possible. °Press the canister down with your index finger to release the medicine. °At the same time as the canister is pressed, inhale deeply and slowly until your lungs are completely filled. This should take 4-6 seconds. Keep your tongue down. °Hold the medicine in your lungs for 5-10 seconds (10 seconds is best). This helps the medicine get into the  small airways of your lungs. °Breathe out slowly, through pursed lips. Whistling is an example of pursed lips. °Wait at least 15-30 seconds between puffs. Continue with the above steps until you have taken the number of puffs your health care provider has ordered. Do not use the inhaler more than your health care provider tells you. °Replace the cap on the inhaler. °Follow the directions from your health care provider or the inhaler insert for cleaning the inhaler. °STEPS TO FOLLOW IF USING AN INHALER WITH AN EXTENSION (SPACER) °Remove the cap from the inhaler. °If you are using the inhaler for the first time, you will need to prime it. Shake the inhaler for 5 seconds and release four puffs into the air, away from your face. Ask your health care provider or pharmacist if you have questions about priming your inhaler. °Shake the inhaler for 5 seconds before each breath in (inhalation). °Place the open end of the spacer onto the mouthpiece of the inhaler. °Position the inhaler so that the top of the canister faces up and the spacer mouthpiece faces you. °Put your index finger on the top of the medicine canister. Your thumb supports the bottom of the inhaler and the spacer. °Breathe out (exhale) normally and as completely as possible. °Immediately after exhaling, place the spacer between your teeth and into your mouth. Close your lips tightly around the spacer. °Press the canister down with your index finger to release the medicine. °At the same time as the canister is pressed, inhale deeply and slowly until your lungs are completely filled. This should take 4-6 seconds. Keep your tongue down and out of the way. °Hold the medicine in your lungs for 5-10 seconds (10 seconds is best). This helps the medicine get into the small airways of your lungs. Exhale. °Repeat inhaling deeply through the spacer mouthpiece. Again hold that breath for up to 10 seconds (10 seconds is best). Exhale slowly. If it is difficult to take  this second deep breath through the spacer, breathe normally several times through the spacer. Remove the spacer from your mouth. °Wait at least 15-30 seconds between puffs. Continue with the above steps until you have taken the number of puffs your health care provider has ordered. Do not use the inhaler more than your health care provider tells you. °Remove the spacer from the inhaler, and place the cap on the inhaler. °Follow the directions from your health care provider   or the inhaler insert for cleaning the inhaler and spacer. °If you are using different kinds of inhalers, use your quick relief medicine to open the airways 10-15 minutes before using a steroid if instructed to do so by your health care provider. If you are unsure which inhalers to use and the order of using them, ask your health care provider, nurse, or respiratory therapist. °If you are using a steroid inhaler, always rinse your mouth with water after your last puff, then gargle and spit out the water. Do not swallow the water. °AVOID: °Inhaling before or after starting the spray of medicine. It takes practice to coordinate your breathing with triggering the spray. °Inhaling through the nose (rather than the mouth) when triggering the spray. °HOW TO DETERMINE IF YOUR INHALER IS FULL OR NEARLY EMPTY °You cannot know when an inhaler is empty by shaking it. A few inhalers are now being made with dose counters. Ask your health care provider for a prescription that has a dose counter if you feel you need that extra help. If your inhaler does not have a counter, ask your health care provider to help you determine the date you need to refill your inhaler. Write the refill date on a calendar or your inhaler canister. Refill your inhaler 7-10 days before it runs out. Be sure to keep an adequate supply of medicine. This includes making sure it is not expired, and that you have a spare inhaler.  °SEEK MEDICAL CARE IF:  °Your symptoms are only partially  relieved with your inhaler. °You are having trouble using your inhaler. °You have some increase in phlegm. °SEEK IMMEDIATE MEDICAL CARE IF:  °You feel little or no relief with your inhalers. You are still wheezing and are feeling shortness of breath or tightness in your chest or both. °You have dizziness, headaches, or a fast heart rate. °You have chills, fever, or night sweats. °You have a noticeable increase in phlegm production, or there is blood in the phlegm. °MAKE SURE YOU:  °Understand these instructions. °Will watch your condition. °Will get help right away if you are not doing well or get worse. °Document Released: 05/21/2000 Document Revised: 03/14/2013 Document Reviewed: 12/21/2012 °[employer]® Patient Information ©2015 [employer]. This information is not intended to replace advice given to you by your health care provider. Make sure you discuss any questions you have with your health care provider. ° ° ° °

## 2016-06-03 NOTE — ED Notes (Signed)
Unsuccessful attempt to collect labs.

## 2016-06-03 NOTE — ED Notes (Signed)
This RN attempted x 2 to obtain labs. Main lab has been called and will come to collect blood work

## 2016-06-03 NOTE — ED Triage Notes (Signed)
Patient started having difficulty breathing yesterday. Patient woke up this morning wheezing was worse. Patient started her home albuterol treatment. Patient still wheezing after 2 treatments. Iniital O2 90% room air.

## 2016-10-12 ENCOUNTER — Encounter (HOSPITAL_COMMUNITY): Payer: Self-pay | Admitting: Emergency Medicine

## 2016-10-12 ENCOUNTER — Emergency Department (HOSPITAL_COMMUNITY): Payer: Medicare Other

## 2016-10-12 ENCOUNTER — Emergency Department (HOSPITAL_COMMUNITY)
Admission: EM | Admit: 2016-10-12 | Discharge: 2016-10-13 | Disposition: A | Discharge status: Home / Self Care | Payer: Medicare Other | Attending: Emergency Medicine | Admitting: Emergency Medicine

## 2016-10-12 DIAGNOSIS — S53025A Posterior dislocation of left radial head, initial encounter: Secondary | ICD-10-CM | POA: Insufficient documentation

## 2016-10-12 DIAGNOSIS — S53105A Unspecified dislocation of left ulnohumeral joint, initial encounter: Secondary | ICD-10-CM

## 2016-10-12 DIAGNOSIS — Y9301 Activity, walking, marching and hiking: Secondary | ICD-10-CM | POA: Insufficient documentation

## 2016-10-12 DIAGNOSIS — W108XXA Fall (on) (from) other stairs and steps, initial encounter: Secondary | ICD-10-CM | POA: Insufficient documentation

## 2016-10-12 DIAGNOSIS — J45909 Unspecified asthma, uncomplicated: Secondary | ICD-10-CM | POA: Diagnosis not present

## 2016-10-12 DIAGNOSIS — Z85828 Personal history of other malignant neoplasm of skin: Secondary | ICD-10-CM | POA: Insufficient documentation

## 2016-10-12 DIAGNOSIS — Z79899 Other long term (current) drug therapy: Secondary | ICD-10-CM | POA: Insufficient documentation

## 2016-10-12 DIAGNOSIS — Z96651 Presence of right artificial knee joint: Secondary | ICD-10-CM | POA: Diagnosis not present

## 2016-10-12 DIAGNOSIS — Z7982 Long term (current) use of aspirin: Secondary | ICD-10-CM | POA: Diagnosis not present

## 2016-10-12 DIAGNOSIS — S51012A Laceration without foreign body of left elbow, initial encounter: Secondary | ICD-10-CM | POA: Insufficient documentation

## 2016-10-12 DIAGNOSIS — S51812A Laceration without foreign body of left forearm, initial encounter: Secondary | ICD-10-CM

## 2016-10-12 DIAGNOSIS — I10 Essential (primary) hypertension: Secondary | ICD-10-CM | POA: Insufficient documentation

## 2016-10-12 DIAGNOSIS — Y999 Unspecified external cause status: Secondary | ICD-10-CM | POA: Insufficient documentation

## 2016-10-12 DIAGNOSIS — Y929 Unspecified place or not applicable: Secondary | ICD-10-CM | POA: Diagnosis not present

## 2016-10-12 DIAGNOSIS — S53106A Unspecified dislocation of unspecified ulnohumeral joint, initial encounter: Secondary | ICD-10-CM

## 2016-10-12 DIAGNOSIS — S59902A Unspecified injury of left elbow, initial encounter: Secondary | ICD-10-CM | POA: Diagnosis present

## 2016-10-12 HISTORY — DX: Unspecified dislocation of unspecified ulnohumeral joint, initial encounter: S53.106A

## 2016-10-12 MED ORDER — FENTANYL CITRATE (PF) 100 MCG/2ML IJ SOLN
100.0000 ug | Freq: Once | INTRAMUSCULAR | Status: AC
  Administered 2016-10-13: 100 ug via INTRAVENOUS
  Filled 2016-10-12: qty 2

## 2016-10-12 NOTE — ED Notes (Signed)
Bed: WA09 Expected date:  Expected time:  Means of arrival:  Comments: Fall obvious deformity

## 2016-10-12 NOTE — ED Provider Notes (Signed)
[facility] DEPT Provider Note    By signing my name below, I, [name], attest that this documentation has been prepared under the direction and in the presence of [name], MD. Electronically Signed: [name], ED Scribe. 10/13/16. 2:36 AM.    History   Chief Complaint Chief Complaint  Patient presents with  . Fall  . Elbow Injury   The history is provided by the patient and medical records. No language interpreter was used.    HPI Comments:  [name] is a 76 y.o. female with PMHx of anxiety, asthma, skin cancer, HTN, brought in by EMS, who presents to the Emergency Department complaining of a mechanical fall that occurred approximately one hour ago. She reports associated severe left elbow pain with a laceration of the area. She states she tripped up a stair causing her to fall backwards and land on her LUE and strike the back of her head. She was given Fentanyl 200 mcg by EMS for pain with temporary relief. Attempting to move the left arm or touching the area increases the pain. She denies alleviating factors. She denies LOC, nausea, vomiting, back pain, neck pain. She states she is allergic to Codeine.  She has had a brachioplasty on that arm by Dr. Stephanie Coup in the past.   Past Medical History:  Diagnosis Date  . Anxiety   . Arthritis   . Asthma   . Cancer ([place])    skin cancers, one melanoma   . Complication of anesthesia    patient woke up during colonoscopy  . Hypertension   . Kidney stones    hx of   . Pneumonia    hx of     Patient Active Problem List   Diagnosis Date Noted  . Postoperative anemia due to acute blood loss 02/15/2013  . OA (osteoarthritis) of knee 01/29/2013    Past Surgical History:  Procedure Laterality Date  . BUNIONECTOMY     left   . EYE SURGERY     bilater[place] cataract surgery   . KNEE ARTHROSCOPY     left   . polyp removed from uterus     . right wrist pinning     . TOTAL KNEE ARTHROPLASTY Right  01/29/2013   Procedure: RIGHT TOTAL KNEE ARTHROPLASTY;  Surgeon: Gearlean Alf, MD;  Location: [facility];  Service: Orthopedics;  Laterality: Right;    OB History    No data available       Home Medications    Prior to Admission medications   Medication Sig Start Date End Date Taking? Authorizing Provider  albuterol (PROVENTIL HFA;VENTOLIN HFA) 108 (90 Base) MCG/ACT inhaler Inhale 2 puffs into the lungs every 4 (four) hours as needed for wheezing or shortness of breath. 06/03/16  Yes Long, Wonda Olds, MD  aspirin EC 81 MG tablet Take 81 mg by mouth daily.   Yes [provider]  B Complex-C (B-COMPLEX WITH VITAMIN C) tablet Take 1 tablet by mouth daily.   Yes [provider]  budesonide-formoterol (SYMBICORT) 160-4.5 MCG/ACT inhaler Inhale 2 puffs into the lungs 2 (two) times daily. For allergy symptoms    Yes [provider]  celecoxib (CELEBREX) 200 MG capsule Take 200 mg by mouth daily.   Yes [provider]  cholecalciferol (VITAMIN D) 1000 units tablet Take 1,000 Units by mouth daily.   Yes [provider]  HYDROcodone-acetaminophen (NORCO/VICODIN) 5-325 MG per tablet Take 1 tablet by mouth every 6 (six) hours as needed. Patient taking  differently: Take 1 tablet by mouth every 6 (six) hours as needed for moderate pain.  10/28/14  Yes [name], MD  levocetirizine (XYZAL) 5 MG tablet Take 5 mg by mouth every evening.    Yes [provider]  omega-3 acid ethyl esters (LOVAZA) 1 g capsule Take 1 g by mouth daily.   Yes [provider]  Potassium 99 MG TABS Take 1 tablet by mouth daily.     Yes [provider]  propranolol ER (INDERAL LA) 80 MG 24 hr capsule Take 80 mg by mouth daily.   Yes [provider]  Red Yeast Rice Extract POWD Take 1 tablet by mouth daily.   Yes [provider]  RESTASIS 0.05 % ophthalmic emulsion Place 1 drop into both eyes daily.  04/20/16  Yes [provider]    triamterene-hydrochlorothiazide (MAXZIDE-25) 37.5-25 MG per tablet Take 1 tablet by mouth every morning.    Yes [provider]  TURMERIC PO Take 1 capsule by mouth daily.   Yes [provider]  UBIQUINOL PO Take 1 tablet by mouth daily.   Yes [provider]    Family History History reviewed. No pertinent family history.  Social History Social History  Substance Use Topics  . Smoking status: Never Smoker  . Smokeless tobacco: Never Used  . Alcohol use Yes     Comment: champagne with meals      Allergies   Codeine and Other   Review of Systems Review of Systems  Gastrointestinal: Negative for nausea and vomiting.  Musculoskeletal: Positive for arthralgias and joint swelling. Negative for back pain and neck pain.  Skin: Positive for wound.  Neurological: Negative for syncope.  All other systems reviewed and are negative.    Physical Exam Updated Vital Signs BP 128/79 (BP Location: Right Arm)   Pulse 79   Temp 97.6 F (36.4 C) (Oral)   Resp 12   SpO2 91%   Physical Exam  Constitutional: She is oriented to person, place, and time. She appears well-developed and well-nourished.  HENT:  Head: Normocephalic.  Right Ear: External ear normal.  Left Ear: External ear normal.  Nose: Nose normal.  No scalp tenderness or hematoma.  Eyes: Right eye exhibits no discharge. Left eye exhibits no discharge.  Cardiovascular: Normal rate, regular rhythm and normal heart sounds.   Left radial pulse 2+  Pulmonary/Chest: Effort normal and breath sounds normal.  Abdominal: Soft. There is no tenderness.  Musculoskeletal: She exhibits tenderness and deformity.  Left elbow deformity. Small laceration with fat exposed on proximal forearm.  Neurological: She is alert and oriented to person, place, and time.  Normal distal sensation and strength in left hand.  Skin: Skin is warm and dry.  Nursing note and vitals reviewed.    ED Treatments / Results   DIAGNOSTIC STUDIES: Oxygen Saturation is 91% on RA, low by my interpretation.   COORDINATION OF CARE: 11:48 PM- Will order pain medications and review imaging. Pt verbalizes understanding and agrees to plan.  1:53 AM- Spoke with Dr. Fredna Dow who recommends irrigating the wound well prior to closure and prescribe Doxycycline. Pt to call his office tomorrow for follow up.   Medications  oxyCODONE-acetaminophen (PERCOCET/ROXICET) 5-325 MG per tablet 2 tablet (not administered)  fentaNYL (SUBLIMAZE) injection 100 mcg (100 mcg Intravenous Given 10/13/16 0008)  lidocaine-EPINEPHrine (XYLOCAINE W/EPI) 2 %-1:200000 (PF) injection 20 mL (20 mLs Intradermal Given 10/13/16 0045)  sodium chloride 0.9 % bolus 1,000 mL (1,000 mLs Intravenous New Bag/Given  10/13/16 0045)  propofol (DIPRIVAN) 10 mg/mL bolus/IV push 65.8 mg (65.8 mg Intravenous Given 10/13/16 0104)  fentaNYL (SUBLIMAZE) injection 100 mcg (100 mcg Intravenous Given 10/13/16 0203)    Labs (all labs ordered are listed, but only abnormal results are displayed) Labs Reviewed - No data to display  EKG  EKG Interpretation None       Radiology Dg Elbow 2 Views Left  Result Date: 10/13/2016 CLINICAL DATA:  Postreduction left elbow EXAM: LEFT ELBOW - 2 VIEW COMPARISON:  10/12/2016 FINDINGS: Examination is limited by nonstandard positioning and overlying splint material. On the images provided, the left elbow looks to be in anatomic position postreduction. No displaced fractures are identified. IMPRESSION: Left elbow appears to be in anatomic position postreduction. Electronically Signed   By: Lucienne Capers M.D.   On: 10/13/2016 02:08   Dg Elbow Complete Left  Result Date: 10/12/2016 CLINICAL DATA:  Left elbow deformity after a fall tonight. EXAM: LEFT ELBOW - COMPLETE 3+ VIEW COMPARISON:  None. FINDINGS: Nonstandard positioning limits examination. There is posterior dislocation of the radius and ulna with respect to the distal humerus. No displaced  fractures are identified. Soft tissue swelling. IMPRESSION: Posterior dislocation of the left elbow. Electronically Signed   By: Lucienne Capers M.D.   On: 10/12/2016 23:52    Procedures Reduction of dislocation Date/Time: 10/13/2016 1:34 AM Performed by: [name] Authorized by: [name]  Consent: Verbal consent obtained. Consent given by: patient Patient understanding: patient states understanding of the procedure being performed Patient consent: the patient's understanding of the procedure matches consent given Procedure consent: procedure consent matches procedure scheduled Relevant documents: relevant documents present and verified Test results: test results available and properly labeled Site marked: the operative site was marked Imaging studies: imaging studies available Required items: required blood products, implants, devices, and special equipment available Patient identity confirmed: verbally with patient Time out: Immediately prior to procedure a "time out" was called to verify the correct patient, procedure, equipment, support staff and site/side marked as required. Preparation: Patient was prepped and draped in the usual sterile fashion. Patient tolerance: Patient tolerated the procedure well with no immediate complications Comments: Left elbow dislocation reduced with traction  .Marland KitchenLaceration Repair Date/Time: 10/13/2016 2:06 AM Performed by: [name] Authorized by: [name]   Consent:    Consent obtained:  Verbal   Consent given by:  Patient Anesthesia (see MAR for exact dosages):    Anesthesia method:  Local infiltration   Local anesthetic:  Lidocaine 2% WITH epi Laceration details:    Location:  Shoulder/arm   Shoulder/arm location:  L elbow   Length (cm):  5 Repair type:    Repair type:  Simple Pre-procedure details:    Preparation:  Patient was prepped and draped in usual sterile fashion and imaging obtained to evaluate for foreign  bodies Exploration:    Contaminated: no   Treatment:    Amount of cleaning:  Extensive   Irrigation solution:  Sterile saline   Irrigation method:  Syringe Skin repair:    Repair method:  Sutures   Suture size:  4-0   Number of sutures:  5 Approximation:    Vermilion border: well-aligned   Post-procedure details:    Dressing:  Splint for protection and sterile dressing   Patient tolerance of procedure:  Tolerated well, no immediate complications   (including critical care time) Procedural sedation Performed by: [name] T Consent: Verbal consent obtained. Risks and benefits: risks, benefits and alternatives were discussed Required items:  required blood products, implants, devices, and special equipment available Patient identity confirmed: arm band and provided demographic data Time out: Immediately prior to procedure a "time out" was called to verify the correct patient, procedure, equipment, support staff and site/side marked as required.  Sedation type: moderate (conscious) sedation NPO time confirmed and considedered  Sedatives: PROPOFOL  Physician Time at Bedside: 15 minutes  Vitals: Vital signs were monitored during sedation. Cardiac Monitor, pulse oximeter Patient tolerance: Patient tolerated the procedure well with no immediate complications. Comments: Pt with uneventful recovered. Returned to pre-procedural sedation baseline     Medications Ordered in ED Medications  oxyCODONE-acetaminophen (PERCOCET/ROXICET) 5-325 MG per tablet 2 tablet (not administered)  fentaNYL (SUBLIMAZE) injection 100 mcg (100 mcg Intravenous Given 10/13/16 0008)  lidocaine-EPINEPHrine (XYLOCAINE W/EPI) 2 %-1:200000 (PF) injection 20 mL (20 mLs Intradermal Given 10/13/16 0045)  sodium chloride 0.9 % bolus 1,000 mL (1,000 mLs Intravenous New Bag/Given 10/13/16 0045)  propofol (DIPRIVAN) 10 mg/mL bolus/IV push 65.8 mg (65.8 mg Intravenous Given 10/13/16 0104)  fentaNYL (SUBLIMAZE) injection  100 mcg (100 mcg Intravenous Given 10/13/16 0203)     Initial Impression / Assessment and Plan / ED Course  I have reviewed the triage vital signs and the nursing notes.  Pertinent labs & imaging results that were available during my care of the patient were reviewed by me and considered in my medical decision making (see chart for details).     Patient's dislocation was reduced as above. After this, the wound now appears over the proximal ulna. Difficult to see the exact depth of injury, thus concern it could be an open joint. Tdap up to date. D/w Dr. Fredna Dow, who feels this is less likely, but asks for good irrigation, close skin with sutures, and place on doxycycline. f/u in his office. She remains NV intact after reduction and splint placement. Oxycodone for pain (has codeine allergy but has tolerated this in past). No signs of head/spinal injury. Discussed return precautions.  Final Clinical Impressions(s) / ED Diagnoses   Final diagnoses:  Elbow dislocation, left, initial encounter  Laceration of left forearm, initial encounter    New Prescriptions New Prescriptions   No medications on file    I personally performed the services described in this documentation, which was scribed in my presence. The recorded information has been reviewed and is accurate.     [name], MD 10/13/16 1006

## 2016-10-12 NOTE — ED Triage Notes (Signed)
Patient BIB [facility] from home for fall. Pt was walking into her front door and missed a step. Denies neck/back pain, no LOC. EMS reports obvious deformity and small LAC to left elbow. Pt given 200 mcg fentanyl by EMS.

## 2016-10-13 ENCOUNTER — Emergency Department (HOSPITAL_COMMUNITY): Payer: Medicare Other

## 2016-10-13 MED ORDER — OXYCODONE-ACETAMINOPHEN 5-325 MG PO TABS
2.0000 | ORAL_TABLET | Freq: Once | ORAL | Status: AC
  Administered 2016-10-13: 2 via ORAL
  Filled 2016-10-13: qty 2

## 2016-10-13 MED ORDER — FENTANYL CITRATE (PF) 100 MCG/2ML IJ SOLN
100.0000 ug | Freq: Once | INTRAMUSCULAR | Status: AC
  Administered 2016-10-13: 100 ug via INTRAVENOUS
  Filled 2016-10-13: qty 2

## 2016-10-13 MED ORDER — SODIUM CHLORIDE 0.9 % IV BOLUS (SEPSIS)
1000.0000 mL | Freq: Once | INTRAVENOUS | Status: AC
  Administered 2016-10-13: 1000 mL via INTRAVENOUS

## 2016-10-13 MED ORDER — LIDOCAINE-EPINEPHRINE (PF) 2 %-1:200000 IJ SOLN
20.0000 mL | Freq: Once | INTRAMUSCULAR | Status: AC
  Administered 2016-10-13: 20 mL via INTRADERMAL
  Filled 2016-10-13: qty 20

## 2016-10-13 MED ORDER — PROPOFOL 10 MG/ML IV BOLUS
1.0000 mg/kg | Freq: Once | INTRAVENOUS | Status: AC
  Administered 2016-10-13: 65.8 mg via INTRAVENOUS
  Filled 2016-10-13: qty 20

## 2016-10-13 MED ORDER — OXYCODONE-ACETAMINOPHEN 5-325 MG PO TABS: 1.0000 | ORAL_TABLET | ORAL | 0 refills | Status: DC | PRN

## 2016-10-13 MED ORDER — DOXYCYCLINE HYCLATE 100 MG PO TABS: 100.0000 mg | ORAL_TABLET | Freq: Once | ORAL | Status: DC

## 2016-10-13 MED ORDER — DOXYCYCLINE HYCLATE 100 MG PO CAPS: 100.0000 mg | ORAL_CAPSULE | Freq: Two times a day (BID) | ORAL | 0 refills | Status: DC

## 2016-11-15 ENCOUNTER — Other Ambulatory Visit: Payer: Self-pay | Admitting: Radiology

## 2016-11-17 ENCOUNTER — Telehealth: Payer: Self-pay | Admitting: *Deleted

## 2016-11-17 NOTE — Telephone Encounter (Signed)
Confirmed [facility] for 6.20.18 at 0815 .  Instructions and contact information given.

## 2016-11-19 ENCOUNTER — Other Ambulatory Visit: Payer: Self-pay | Admitting: *Deleted

## 2016-11-19 ENCOUNTER — Encounter: Payer: Self-pay | Admitting: *Deleted

## 2016-11-19 DIAGNOSIS — Z170 Estrogen receptor positive status [ER+]: Secondary | ICD-10-CM

## 2016-11-19 DIAGNOSIS — C50211 Malignant neoplasm of upper-inner quadrant of right female breast: Secondary | ICD-10-CM

## 2016-11-24 ENCOUNTER — Ambulatory Visit (HOSPITAL_BASED_OUTPATIENT_CLINIC_OR_DEPARTMENT_OTHER): Payer: Medicare Other | Admitting: Oncology

## 2016-11-24 ENCOUNTER — Other Ambulatory Visit (HOSPITAL_BASED_OUTPATIENT_CLINIC_OR_DEPARTMENT_OTHER): Payer: Medicare Other

## 2016-11-24 ENCOUNTER — Ambulatory Visit
Admission: RE | Admit: 2016-11-24 | Discharge: 2016-11-24 | Disposition: A | Discharge status: Home / Self Care | Payer: Medicare Other | Source: Ambulatory Visit | Attending: Radiation Oncology | Admitting: Radiation Oncology

## 2016-11-24 ENCOUNTER — Ambulatory Visit (HOSPITAL_BASED_OUTPATIENT_CLINIC_OR_DEPARTMENT_OTHER): Payer: Self-pay | Admitting: Genetic Counselor

## 2016-11-24 ENCOUNTER — Encounter: Payer: Self-pay | Admitting: Oncology

## 2016-11-24 ENCOUNTER — Encounter: Payer: Self-pay | Admitting: Physical Therapy

## 2016-11-24 ENCOUNTER — Encounter: Payer: Self-pay | Admitting: Genetic Counselor

## 2016-11-24 ENCOUNTER — Other Ambulatory Visit: Payer: Medicare Other

## 2016-11-24 ENCOUNTER — Ambulatory Visit: Payer: Medicare Other | Attending: General Surgery | Admitting: Physical Therapy

## 2016-11-24 VITALS — BP 120/72 | HR 72 | Temp 98.1°F | Resp 18 | Ht 60.0 in | Wt 147.2 lb

## 2016-11-24 DIAGNOSIS — R293 Abnormal posture: Secondary | ICD-10-CM | POA: Insufficient documentation

## 2016-11-24 DIAGNOSIS — Z7183 Encounter for nonprocreative genetic counseling: Secondary | ICD-10-CM

## 2016-11-24 DIAGNOSIS — C50211 Malignant neoplasm of upper-inner quadrant of right female breast: Secondary | ICD-10-CM

## 2016-11-24 DIAGNOSIS — M25522 Pain in left elbow: Secondary | ICD-10-CM | POA: Diagnosis present

## 2016-11-24 DIAGNOSIS — M25562 Pain in left knee: Secondary | ICD-10-CM | POA: Diagnosis present

## 2016-11-24 DIAGNOSIS — C50411 Malignant neoplasm of upper-outer quadrant of right female breast: Secondary | ICD-10-CM

## 2016-11-24 DIAGNOSIS — Z170 Estrogen receptor positive status [ER+]: Secondary | ICD-10-CM | POA: Diagnosis present

## 2016-11-24 DIAGNOSIS — C439 Malignant melanoma of skin, unspecified: Secondary | ICD-10-CM | POA: Insufficient documentation

## 2016-11-24 DIAGNOSIS — Z803 Family history of malignant neoplasm of breast: Secondary | ICD-10-CM

## 2016-11-24 DIAGNOSIS — G8929 Other chronic pain: Secondary | ICD-10-CM | POA: Diagnosis present

## 2016-11-24 DIAGNOSIS — C4362 Malignant melanoma of left upper limb, including shoulder: Secondary | ICD-10-CM

## 2016-11-24 LAB — COMPREHENSIVE METABOLIC PANEL
ALT: 14 U/L (ref 0–55)
ANION GAP: 13 meq/L — AB (ref 3–11)
AST: 22 U/L (ref 5–34)
Albumin: 3.7 g/dL (ref 3.5–5.0)
Alkaline Phosphatase: 66 U/L (ref 40–150)
BILIRUBIN TOTAL: 0.96 mg/dL (ref 0.20–1.20)
BUN: 15.4 mg/dL (ref 7.0–26.0)
CHLORIDE: 100 meq/L (ref 98–109)
CO2: 26 meq/L (ref 22–29)
CREATININE: 0.8 mg/dL (ref 0.6–1.1)
Calcium: 9.8 mg/dL (ref 8.4–10.4)
EGFR: 70 mL/min/{1.73_m2} — ABNORMAL LOW (ref >90)
GLUCOSE: 104 mg/dL (ref 70–140)
Potassium: 3.8 mEq/L (ref 3.5–5.1)
SODIUM: 139 meq/L (ref 136–145)
TOTAL PROTEIN: 6.8 g/dL (ref 6.4–8.3)

## 2016-11-24 LAB — CBC WITH DIFFERENTIAL/PLATELET
BASO%: 0.8 % (ref 0.0–2.0)
BASOS ABS: 0 10*3/uL (ref 0.0–0.1)
EOS%: 2.7 % (ref 0.0–7.0)
Eosinophils Absolute: 0.2 10*3/uL (ref 0.0–0.5)
HEMATOCRIT: 39.7 % (ref 34.8–46.6)
HEMOGLOBIN: 13.4 g/dL (ref 11.6–15.9)
LYMPH%: 27.7 % (ref 14.0–49.7)
MCH: 32.8 pg (ref 25.1–34.0)
MCHC: 33.7 g/dL (ref 31.5–36.0)
MCV: 97.2 fL (ref 79.5–101.0)
MONO#: 0.8 10*3/uL (ref 0.1–0.9)
MONO%: 13.3 % (ref 0.0–14.0)
NEUT#: 3.2 10*3/uL (ref 1.5–6.5)
NEUT%: 55.5 % (ref 38.4–76.8)
Platelets: 220 10*3/uL (ref 145–400)
RBC: 4.08 10*6/uL (ref 3.70–5.45)
RDW: 13 % (ref 11.2–14.5)
WBC: 5.7 10*3/uL (ref 3.9–10.3)
lymph#: 1.6 10*3/uL (ref 0.9–3.3)

## 2016-11-24 MED ORDER — ANASTROZOLE 1 MG PO TABS: 1.0000 mg | ORAL_TABLET | Freq: Every day | ORAL | 4 refills | Status: DC

## 2016-11-24 NOTE — Therapy (Signed)
[place], [place], [address] Phone: [phone]   Fax:  [phone]  Physical Therapy Evaluation  Patient Details  Name: [name] MRN: [id number] Date of Birth: 1941/02/21 Referring Provider: Dr. Excell Seltzer  Encounter Date: 11/24/2016      PT End of Session - 11/24/16 1303    Visit Number 1   Number of Visits 1   PT Start Time 0948   PT Stop Time 1004  Also saw pt from 1036-1100 for a total of 40 minutes   PT Time Calculation (min) 16 min   Activity Tolerance Patient tolerated treatment well   Behavior During Therapy [facility]  for tasks assessed/performed      Past Medical History:  Diagnosis Date  . Anxiety   . Arthritis   . Asthma   . Cancer ([place])    skin cancers, one melanoma   . Complication of anesthesia    patient woke up during colonoscopy  . Hypertension   . Kidney stones    hx of   . Pneumonia    hx of     Past Surgical History:  Procedure Laterality Date  . BUNIONECTOMY     left   . EYE SURGERY     bilater[place] cataract surgery   . KNEE ARTHROSCOPY     left   . polyp removed from uterus     . right wrist pinning     . TOTAL KNEE ARTHROPLASTY Right 01/29/2013   Procedure: RIGHT TOTAL KNEE ARTHROPLASTY;  Surgeon: Gearlean Alf, MD;  Location: [facility];  Service: Orthopedics;  Laterality: Right;    There were no vitals filed for this visit.       Subjective Assessment - 11/24/16 1247    Subjective Patient reports she is here today to be seen by her medical team for her newly diagnosed right breast cancer.   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 It measures 3.5 cm and is located in the upper inner quadrant. It is ER/PR positive and HER2 negative. She had a traumatic fall 10/12/16 which resulted in a left elbow dislocation which is still healing and  appears to be swollen. She had a right total knee replacement in 2014 and has a left total knee replacement scheduled for 01/10/17.   Patient Stated Goals reduce lymphedema risk and learn post op shoulder ROM HEP   Currently in Pain? Yes   Pain Score 6    Pain Location Elbow   Pain Orientation Left   Pain Descriptors / Indicators Aching   Pain Type Chronic pain   Pain Onset More than a month ago   Pain Frequency Intermittent   Aggravating Factors  Using her left arm   Pain Relieving Factors Rest   Multiple Pain Sites Yes   Pain Score 8   Pain Location Knee   Pain Orientation Left   Pain Descriptors / Indicators Aching   Pain Type Chronic pain   Pain Onset More than a month ago   Pain Frequency Intermittent   Aggravating Factors  walking, standing   Pain Relieving Factors sitting            [facility] PT Assessment - 11/24/16 0001      Assessment   Medical Diagnosis Right breast cancer   Referring Provider Dr. Excell Seltzer   Onset Date/Surgical Date 11/11/16   Hand Dominance Right  Prior Therapy none     Precautions   Precautions Other (comment)   Precaution Comments active cancer     Restrictions   Weight Bearing Restrictions No     Balance Screen   Has the patient fallen in the past 6 months Yes   How many times? 1   Has the patient had a decrease in activity level because of a fear of falling?  No   Is the patient reluctant to leave their home because of a fear of falling?  No  Reports fall was not due to loss of balance     Home Environment   Living Environment Private residence   Living Arrangements Spouse/significant other   Available Help at Discharge Family     Prior Function   Level of Independence Independent   Vocation Retired   Leisure She does yoga, works out with a Clinical research associate 2x/wk, and exercises at the [employer]   Overall Cognitive Status Within Functional Limits for tasks assessed     Posture/Postural Control   Posture/Postural  Control Postural limitations   Postural Limitations Rounded Shoulders;Forward head     ROM / Strength   AROM / PROM / Strength AROM;Strength     AROM   AROM Assessment Site Shoulder;Cervical   Right/Left Shoulder Right;Left   Right Shoulder Extension 43 Degrees   Right Shoulder Flexion 146 Degrees   Right Shoulder ABduction 162 Degrees   Right Shoulder Internal Rotation 65 Degrees   Right Shoulder External Rotation 82 Degrees   Left Shoulder Extension 51 Degrees   Left Shoulder Flexion 152 Degrees   Left Shoulder ABduction 152 Degrees   Left Shoulder Internal Rotation 65 Degrees   Left Shoulder External Rotation 79 Degrees   Cervical Flexion WNL   Cervical Extension WNL   Cervical - Right Side Bend 50% limited   Cervical - Left Side Bend 50% limited   Cervical - Right Rotation WNL   Cervical - Left Rotation 25% limited     Strength   Overall Strength Within functional limits for tasks performed           LYMPHEDEMA/ONCOLOGY QUESTIONNAIRE - 11/24/16 1258      Type   Cancer Type Right breast cancer     Lymphedema Assessments   Lymphedema Assessments Upper extremities     Right Upper Extremity Lymphedema   10 cm Proximal to Olecranon Process 33.2 cm   Olecranon Process 25.4 cm   10 cm Proximal to Ulnar Styloid Process 22.8 cm   Just Proximal to Ulnar Styloid Process 16 cm   Across Hand at [employer] 17.4 cm   At [place] of 2nd Digit 6.3 cm     Left Upper Extremity Lymphedema   10 cm Proximal to Olecranon Process 34.5 cm   Olecranon Process 28.1 cm   10 cm Proximal to Ulnar Styloid Process 22.5 cm   Just Proximal to Ulnar Styloid Process 16.3 cm   Across Hand at [employer] 17 cm   At [place] of 2nd Digit 5.9 cm         Objective measurements completed on examination: See above findings.       Patient was instructed today in a home exercise program today for post op shoulder range of motion. These included active assist shoulder flexion in sitting,  scapular retraction, wall walking with shoulder abduction, and hands behind head external rotation.  She was encouraged to do these twice a day, holding 3 seconds  and repeating 5 times when permitted by her physician.               PT Education - 11/24/16 1302    Education provided Yes   Education Details Lymphedema risk reduction and post op shoulder ROM HEP   Person(s) Educated Patient;Spouse   Methods Explanation;Demonstration;Handout   Comprehension Returned demonstration;Verbalized understanding              Breast Clinic Goals - 11/24/16 1325      Patient will be able to verbalize understanding of pertinent lymphedema risk reduction practices relevant to her diagnosis specifically related to skin care.   Time 1   Period Days   Status Achieved     Patient will be able to return demonstrate and/or verbalize understanding of the post-op home exercise program related to regaining shoulder range of motion.   Time 1   Period Days   Status Achieved     Patient will be able to verbalize understanding of the importance of attending the postoperative After Breast Cancer Class for further lymphedema risk reduction education and therapeutic exercise.   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 It measures 3.5 cm and is located in the upper inner quadrant. It is ER/PR positive and HER2 negative. She had a traumatic fall 10/12/16 which resulted in a left elbow dislocation which is still healing and appears to be swollen. She had a right total knee replacement in 2014 and has a left total knee replacement scheduled for 01/10/17. She expressed concerns about vaginal dryness and having to stop her Premarin. We discussed the option of seeing a physical therapist who specializes in pelvic floor health and she has been  referred for an evaluation that will occur on 11/25/16. She declined PT to address possible balance deficits for now but knows that we could do that later. Her multidisciplinary medical team met prior to her assessments to determine a recommended treatment plan. She is planning to have an MRI, neoadjuvant Anastrozole, and depending on MRI results, she may continue Anastrozole for several months and try to have a lumpectomy or may require a mastectomy. Either surgery will require a sentinel node biopsy, possible radiation, and aduvant anti-estrogen therapy. She may benefit from PT post operatively to help her regain shoulder ROM and reduce her risk for lymphedema.   History and Personal Factors relevant to plan of care: Possible fall risk, recent traumatic fall down steps with elbow dislocation, upcoming left total knee replacement 01/10/17   Clinical Presentation Evolving   Clinical Presentation due to: Awaiting MRI results which may change the treatment plan; has several comorbidities that could impact rehab   Clinical Decision Making Moderate   Rehab Potential Excellent   Clinical Impairments Affecting Rehab Potential Above stated comorbidities   PT Frequency One time visit   PT Treatment/Interventions Patient/family education;Therapeutic exercise   PT Next Visit Plan Will be evaluated 11/25/16 for pelvic floor concerns; will f/u with cancer rehab PT post operatively   PT Home Exercise Plan Post op shoulder ROM HEP   Consulted and Agree with Plan of Care Patient;Family member/caregiver   Family Member Consulted Husband      Patient will benefit from skilled therapeutic intervention in order to improve the following deficits and impairments:  Decreased range of motion, Impaired UE functional use, Pain, Decreased knowledge of precautions, Postural dysfunction  Visit Diagnosis: Carcinoma of upper-inner quadrant of right breast in female, estrogen receptor positive ([facility]) - Plan: PT plan of care  cert/re-cert  Abnormal posture - Plan: PT plan of care cert/re-cert  Chronic pain of left knee - Plan: PT plan of care cert/re-cert  Pain in left elbow - Plan: PT plan of care cert/re-cert  Patient will follow up at outpatient cancer rehab if needed following surgery.  If the patient requires physical therapy at that time, a specific plan will be dictated and sent to the referring physician for approval. The patient was educated today on appropriate basic range of motion exercises to begin post operatively and the importance of attending the After Breast Cancer class following surgery.  Patient was educated today on lymphedema risk reduction practices as it pertains to recommendations that will benefit the patient immediately following surgery.  She verbalized good understanding.  No additional physical therapy is indicated at this time.        G-Codes - 12-23-16 1325    Functional Assessment Tool Used (Outpatient Only) Clinical Judgement   Functional Limitation Other PT subsequent   Other PT Secondary Current Status (Q6834) At least 1 percent but less than 20 percent impaired, limited or restricted   Other PT Secondary Goal Status (H9622) At least 1 percent but less than 20 percent impaired, limited or restricted   Other PT Secondary Discharge Status (W9798) At least 1 percent but less than 20 percent impaired, limited or restricted       Problem List Patient Active Problem List   Diagnosis Date Noted  . Malignant neoplasm of upper-inner quadrant of right breast in female, estrogen receptor positive ([place]) 11/19/2016  . Postoperative anemia due to acute blood loss 02/15/2013  . OA (osteoarthritis) of knee 01/29/2013    Annia Friendly, PT Dec 23, 2016 1:29 PM  [place], [place], [address] Phone: [phone]   Fax:  [phone]  Name: [name] MRN: [id number] Date of Birth: 1941-05-11

## 2016-11-24 NOTE — Progress Notes (Signed)
[facility]  Telephone:([phone]) [phone] Fax:(336) [phone]     ID: AIREAL SLATER DOB: September 19, 1940  MR#: [id number]  FMB#:[id number]  Patient Care Team: [name], MD as PCP - General (Family Medicine) Excell Seltzer, MD as Consulting Physician (General Surgery) Latissa Frick, Virgie Dad, MD as Consulting Physician (Oncology) Gery Pray, MD as Consulting Physician (Radiation Oncology) Lorelle Gibbs, MD (Radiology) Gaynelle Arabian, MD as Consulting Physician (Orthopedic Surgery) Harold Hedge, Darrick Grinder, MD as Consulting Physician (Allergy and Immunology) Teena Irani, MD as Consulting Physician (Gastroenterology) Chauncey Cruel, MD OTHER MD:  CHIEF COMPLAINT: Estrogen receptor positive breast cancer  CURRENT TREATMENT: Awaiting definitive surgery   BREAST CANCER HISTORY: The patient has had concerns in the right breast dating back to about 3 years when her primary care physician Dr. [name] noted a change. The breast has been followed very closely but no diagnosis was established. She was followed with annual diagnostic mammography and on 11/11/2016 bilateral diagnostic mammography with tomography at [facility] found the breast density to be category C. There was a new 3.5 cm irregular spiculated mass in the right breast upper inner quadrant. Ultrasound was obtained the same day and confirmed a 3.5 cm solid mass in the right breast upper inner quadrant, with no abnormalities noted in the right axilla.  Biopsy of the right breast mass in question 11/15/2013 showed ([id number] invasive lobular carcinoma, E-cadherin negative, grade 1 or 2, estrogen receptor 90% positive, progesterone receptor 2% positive, both with strong staining intensity, with an MIB-1 of 5, and no HER-2 amplification, the signals ratio being 1.14 and the number per cell 1.20.  Her subsequent history is as detailed below  INTERVAL HISTORY: Luree was evaluated in the multidisciplinary breast cancer  clinic 11/24/2016 accompanied by her husband [name]. Her case was also presented in the multidisciplinary breast cancer conference that same morning. At that time a preliminary plan was proposed: Breast MRI, then breast conserving surgery with sentinel lymph node sampling as appropriate; Oncotype will be sent from the definitive surgical specimen; radiation; consideration of neoadjuvant anastrozole  REVIEW OF SYSTEMS: There were no specific symptoms leading to the original mammogram, which was routinely scheduled. The patient denies unusual headaches, visual changes, nausea, vomiting, stiff neck, dizziness, or gait imbalance. There has been no cough, phlegm production, or pleurisy, no chest pain or pressure, and no change in bowel or bladder habits. The patient denies fever, rash, bleeding, unexplained fatigue or unexplained weight loss. Diamond does have significant arthritis problems, and history of melanoma and eczema, and some anxiety but not depression. A detailed review of systems was otherwise entirely negative.   PAST MEDICAL HISTORY: Past Medical History:  Diagnosis Date  . Anxiety   . Arthritis   . Asthma   . Cancer ([place])    skin cancers, one melanoma   . Complication of anesthesia    patient woke up during colonoscopy  . Family history of breast cancer   . Hypertension   . Kidney stones    hx of   . Melanoma ([place])   . Pneumonia    hx of     PAST SURGICAL HISTORY: Past Surgical History:  Procedure Laterality Date  . BUNIONECTOMY     left   . EYE SURGERY     bilaterla cataract surgery   . KNEE ARTHROSCOPY     left   . polyp removed from uterus     . right wrist pinning     . TOTAL KNEE ARTHROPLASTY Right 01/29/2013  Procedure: RIGHT TOTAL KNEE ARTHROPLASTY;  Surgeon: Gearlean Alf, MD;  Location: [facility];  Service: Orthopedics;  Laterality: Right;    FAMILY HISTORY Family History  Problem Relation Age of Onset  . COPD Father   . Breast cancer Other        dx in her  71s-30s  The patient's father died at the age of 78 from complications of emphysema and alcohol abuse. The patient's mother died from heart disease at age 77. The patient was an only child. The only relative with breast cancer was a maternal great aunt was diagnosed in her 29s. There is no history of ovarian cancer in the family.  GYNECOLOGIC HISTORY:  No LMP recorded. Patient is postmenopausal. Menarche age 25, first live birth age 37, she is [place] P1. She stopped having periods in 1994. She was using intravaginal Premarin until her breast cancer diagnosis June 2018. She used oral contraceptives for approximately one year remotely with no complications.  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 Her son from her first marriage, Sabino Snipes, lives in [place] and is an Sales promotion account executive. He has 2 children. The patient's second husband, [name], is a retired Film/video editor. He has a son in [place], who works as a Marine scientist. [name] has 5 grandchildren of his own. The patient is a Psychologist, forensic.    ADVANCED DIRECTIVES: In place. The patient tells me she has named her husband [name] and her son Sherren Mocha as healthcare part of attorney   HEALTH MAINTENANCE: Social History  Substance Use Topics  . Smoking status: Never Smoker  . Smokeless tobacco: Never Used  . Alcohol use Yes     Comment: champagne with meals      Colonoscopy:2015  PAP:  Bone density: 01/27/2015 at [place] T score -0.3   Allergies  Allergen Reactions  . Codeine Nausea Only  . Other Hives and Rash    chloroprep.     Current Outpatient Prescriptions  Medication Sig Dispense Refill  . aspirin EC 81 MG tablet Take 81 mg by mouth daily.    . B Complex-C (B-COMPLEX WITH VITAMIN C) tablet Take 1 tablet by mouth daily.    . budesonide-formoterol (SYMBICORT) 160-4.5 MCG/ACT inhaler Inhale 2 puffs into the lungs 2 (two) times daily. For allergy symptoms     . celecoxib (CELEBREX) 200 MG capsule Take 200 mg by  mouth daily.    . cholecalciferol (VITAMIN D) 1000 units tablet Take 1,000 Units by mouth daily.    Marland Kitchen levocetirizine (XYZAL) 5 MG tablet Take 5 mg by mouth every evening.     Marland Kitchen omega-3 acid ethyl esters (LOVAZA) 1 g capsule Take 1 g by mouth daily.    . Potassium 99 MG TABS Take 1 tablet by mouth daily.      . propranolol ER (INDERAL LA) 80 MG 24 hr capsule Take 80 mg by mouth daily.    . Red Yeast Rice Extract POWD Take 1 tablet by mouth daily.    . RESTASIS 0.05 % ophthalmic emulsion Place 1 drop into both eyes daily.     Marland Kitchen triamterene-hydrochlorothiazide (MAXZIDE-25) 37.5-25 MG per tablet Take 1 tablet by mouth every morning.     . TURMERIC PO Take 1 capsule by mouth daily.    Marland Kitchen UBIQUINOL PO Take 1 tablet by mouth daily.     No current facility-administered medications for this visit.     OBJECTIVE: Middle-aged white woman in no acute distress  Vitals:   11/24/16  0859  BP: 120/72  Pulse: 72  Resp: 18  Temp: 98.1 F (36.7 C)     Body mass index is 28.75 kg/m.   Filed Weights   11/24/16 0859  Weight: 147 lb 3.2 oz (66.8 kg)       ECOG FS:1 - Symptomatic but completely ambulatory  Ocular: Sclerae unicteric, pupils round and equal Ear-nose-throat: Oropharynx clear and moist Lymphatic: No cervical or supraclavicular adenopathy Lungs no rales or rhonchi Heart regular rate and rhythm Abd soft, nontender, positive bowel sounds MSK no focal spinal tenderness, no joint edema Neuro: non-focal, well-oriented, appropriate affect Breasts: I do not palpate a well-defined mass in the right breast. There are no skin or nipple changes of concern. The left breast is unremarkable. Both axillae are benign. Skin: The area where patient's remote melanoma was removed from the left upper arm laterally shows no lesions of concern  LAB RESULTS:  CMP     Component Value Date/Time   NA 139 11/24/2016 0842   K 3.8 11/24/2016 0842   CL 95 (L) 06/03/2016 1440   CO2 26 11/24/2016 0842    GLUCOSE 104 11/24/2016 0842   BUN 15.4 11/24/2016 0842   CREATININE 0.8 11/24/2016 0842   CALCIUM 9.8 11/24/2016 0842   PROT 6.8 11/24/2016 0842   ALBUMIN 3.7 11/24/2016 0842   AST 22 11/24/2016 0842   ALT 14 11/24/2016 0842   ALKPHOS 66 11/24/2016 0842   BILITOT 0.96 11/24/2016 0842   GFRNONAA >60 06/03/2016 1440   GFRAA >60 06/03/2016 1440    No results found for: Ronnald Ramp, A1GS, A2GS, BETS, BETA2SER, GAMS, MSPIKE, SPEI  No results found for: Nils Pyle, [facility]  Lab Results  Component Value Date   WBC 5.7 11/24/2016   NEUTROABS 3.2 11/24/2016   HGB 13.4 11/24/2016   HCT 39.7 11/24/2016   MCV 97.2 11/24/2016   PLT 220 11/24/2016      Chemistry      Component Value Date/Time   NA 139 11/24/2016 0842   K 3.8 11/24/2016 0842   CL 95 (L) 06/03/2016 1440   CO2 26 11/24/2016 0842   BUN 15.4 11/24/2016 0842   CREATININE 0.8 11/24/2016 0842      Component Value Date/Time   CALCIUM 9.8 11/24/2016 0842   ALKPHOS 66 11/24/2016 0842   AST 22 11/24/2016 0842   ALT 14 11/24/2016 0842   BILITOT 0.96 11/24/2016 0842       No results found for: LABCA2  No components found for: [id number]  No results for input(s): INR in the last 168 hours.  Urinalysis    Component Value Date/Time   COLORURINE YELLOW 01/22/2013 1345   APPEARANCEUR CLEAR 01/22/2013 1345   LABSPEC 1.011 01/22/2013 1345   PHURINE 6.5 01/22/2013 1345   GLUCOSEU NEGATIVE 01/22/2013 1345   HGBUR TRACE (A) 01/22/2013 1345   BILIRUBINUR NEGATIVE 01/22/2013 1345   KETONESUR NEGATIVE 01/22/2013 1345   PROTEINUR NEGATIVE 01/22/2013 1345   UROBILINOGEN 0.2 01/22/2013 1345   NITRITE NEGATIVE 01/22/2013 1345   LEUKOCYTESUR NEGATIVE 01/22/2013 1345     STUDIES: Mammography and ultrasonography results reviewed with the patient  ELIGIBLE FOR AVAILABLE RESEARCH PROTOCOL: no  ASSESSMENT: 76 y.o. [place] woman status post right breast upper outer quadrant biopsy 11/15/2016  for a clinical T2 N0, stage IB invasive lobular carcinoma, grade 1 or 2, estrogen and progesterone receptor positive, HER-2 nonamplified, with an MIB-1 of 5%.  (1) genetics testing pending  (2) anastrozole started 11/24/2016  (3) definitive surgery pending  (  4) Oncotype DX to be obtained from the final surgical sample  (5) adjuvant radiation to follow as appropriate  (6) continue anastrozole and minimum of 5 years    PLAN: We spent the better part of today's hour-long appointment discussing the biology of breast cancer in general, and the specifics of the patient's tumor in particular. We first reviewed the fact that cancer is not one disease but more than 100 different diseases and that it is important to keep them separate-- otherwise when friends and relatives discuss their own cancer experiences with Yovana confusion can result. Similarly we explained that if breast cancer spreads to the bone or liver, the patient would not have bone cancer or liver cancer, but breast cancer in the bone and breast cancer in the liver: one cancer in three places-- not 3 different cancers which otherwise would have to be treated in 3 different ways.  We discussed the difference between local and systemic therapy. In terms of loco-regional treatment, lumpectomy plus radiation is equivalent to mastectomy as far as survival is concerned. For this reason, and because the cosmetic results are generally superior, we recommend breast conserving surgery.   We also noted that in terms of sequencing of treatments, whether systemic therapy or surgery is done first does not affect the ultimate outcome. This is relevant in Janea's case since her genetics testing results may take some time and then if she decides to have bilateral mastectomies for example with or without reconstruction that may further delay her treatment options. Starting on an aromatase inhibitor now will give her time to make those decisions without any  urgency  We then discussed the rationale for systemic therapy. There is some risk that this cancer may have already spread to other parts of her body. Patients frequently ask at this point about bone scans, CAT scans and PET scans to find out if they have occult breast cancer somewhere else. The problem is that in early stage disease we are much more likely to find false positives then true cancers and this would expose the patient to unnecessary procedures as well as unnecessary radiation. Scans cannot answer the question the patient really would like to know, which is whether she has microscopic disease elsewhere in her body. For those reasons we do not recommend them.  Of course we would proceed to aggressive evaluation of any symptoms that might suggest metastatic disease, but that is not the case here.  Next we went over the options for systemic therapy which are anti-estrogens, anti-HER-2 immunotherapy, and chemotherapy. Bryn does not meet criteria for anti-HER-2 immunotherapy. She is a good candidate for anti-estrogens.  The question of chemotherapy is more complicated. Chemotherapy is most effective in rapidly growing, aggressive tumors. It is much less effective in low-grade, slow growing cancers, like Alayzha 's. For that reason we are going to request an Oncotype from the definitive surgical sample, as suggested by NCCN guidelines. That will help [place] make a definitive decision regarding chemotherapy in this case.  Finally we discussed the genetics testing issues. In patients who carry a deleterious mutation [for example in a  BRCA gene], the risk of a new breast cancer developing in the future may be sufficiently great that the patient may choose bilateral mastectomies. However if she wishes to keep her breasts in that situation it is safe to do so. That would require intensified screening, which generally means not only yearly mammography but a yearly breast MRI as well. Of course, if there is a  deleterious mutation bilateral oophorectomy would be necessary as there is no standard screening protocol for ovarian cancer.  As noted above we are starting anastrozole now. Mikeyla is aware of the possible toxicities side effects and complications of this agent. She had a bone density 2 years ago which was normal. We will reassess her tolerance once she returns to see me after her local therapy has been completed.  Lakshmi has a good understanding of the overall plan. She agrees with it. She knows the goal of treatment in her case is cure. She will call with any problems that may develop before her next visit here.  Chauncey Cruel, MD   11/24/2016 8:25 PM Medical Oncology and Hematology [facility] [address] [place], [place] [address] Tel. [phone]    Fax. [phone]

## 2016-11-24 NOTE — Addendum Note (Signed)
Addended by: Chauncey Cruel on: 11/24/2016 08:51 PM   Modules accepted: Orders

## 2016-11-24 NOTE — Progress Notes (Signed)
REFERRING PROVIDER: Chauncey Cruel, MD [place], [place] [address]  PRIMARY PROVIDER:  [name], MD  PRIMARY REASON FOR VISIT:  1. Malignant neoplasm of upper-inner quadrant of right breast in female, estrogen receptor positive ([place])   2. Family history of breast cancer   3. 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 female, was seen for a [place] cancer genetics consultation at the request of Dr. Jana Hakim due to a personal and family history of cancer.  [name] presents to clinic today to discuss the possibility of a hereditary predisposition to cancer, genetic testing, and to further clarify her future cancer risks, as well as potential cancer risks for family members.   In 2013, at the age of 79, [name] was diagnosed with malignant melanoma of the upper left arm. This was treated with surgical removal.  In 2018, at the age of 42, [name] was diagnosed with invasive lobular carcinoma of the breast.  She is scheduled for a breast MRI and ultrasound, and treatment options will be determined based on those tests and the genetic testing results.      CANCER HISTORY:   No history exists.     HORMONAL RISK FACTORS:  Menarche was at age 4.  First live birth at age 69.  OCP use for approximately 1-2 years.  Ovaries intact: yes.  Hysterectomy: no.  Menopausal status: postmenopausal.  HRT use: 0 years. Colonoscopy: yes; 1 polyp. Mammogram within the last year: yes. Number of breast biopsies: 1. Up to date with pelvic exams:  yes. Any excessive radiation exposure in the past:  no  Past Medical History:  Diagnosis Date  . Anxiety   . Arthritis   . Asthma   . Cancer ([place])    skin cancers, one melanoma   . Complication of anesthesia    patient woke up during colonoscopy  . Family history of breast cancer   . Hypertension   . Kidney stones    hx  of   . Melanoma ([place])   . Pneumonia    hx of     Past Surgical History:  Procedure Laterality Date  . BUNIONECTOMY     left   . EYE SURGERY     bilaterla cataract surgery   . KNEE ARTHROSCOPY     left   . polyp removed from uterus     . right wrist pinning     . TOTAL KNEE ARTHROPLASTY Right 01/29/2013   Procedure: RIGHT TOTAL KNEE ARTHROPLASTY;  Surgeon: Gearlean Alf, MD;  Location: [facility];  Service: Orthopedics;  Laterality: Right;    Social History   Social History  . Marital status: Married    Spouse name: N/A  . Number of children: N/A  . Years of education: N/A   Social History Main Topics  . Smoking status: Never Smoker  . Smokeless tobacco: Never Used  . Alcohol use Yes     Comment: champagne with meals   . Drug use: No  . Sexual activity: Not Asked   Other Topics Concern  . None   Social History Narrative  . None     FAMILY HISTORY:  We obtained a detailed, 4-generation family history.  Significant diagnoses are listed below: Family History  Problem Relation Age of Onset  . COPD Father   . Breast cancer Other  dx in her 6s-30s    The patient has one son and two grandsons who are all cancer free.  She is an only child.  Both parents are deceased.  Her mother died at 81 from atherosclerosis.  Her mother had two full sisters and a paternal half sister who are all cancer free.  Her full sisters are deceased.  Her grandmother died of TB in her 56's-30's.  Her grandmother's sister had breast cancer and died in her 78's-30's.  The patient's father died at 54 from COPD and a heart attack.  He had two full brothers, a full sister and several maternal half siblings.  Nobody had cancer to the patient's knowledge.  [name] is unaware of previous family history of genetic testing for hereditary cancer risks. Patient's ancestors are of [place], [place], [place] and [place] descent. There is no reported Ashkenazi Jewish ancestry. There is no known  consanguinity.  GENETIC COUNSELING ASSESSMENT: [name] is a 76 y.o. female with a personal and family history of cancer which is somewhat suggestive of a hereditary cancer syndrome and predisposition to cancer. We, therefore, discussed and recommended the following at today's visit.   DISCUSSION: We discussed that about 5-10% of breast cancer is hereditary, with most cases due to BRCA mutations.  We also discussed that about 5-10% of melanoma is hereditary, with most cases due to CDKN2A mutations.  We also discussed that BRCA mutations are melanoma subordinate conditions, meaning that melanoma can be a cancer associated with this condition, even though it is not a primary cancer seen.  We reviewed the characteristics, features and inheritance patterns of hereditary cancer syndromes. We also discussed genetic testing, including the appropriate family members to test, the process of testing, insurance coverage and turn-around-time for results. We discussed the implications of a negative, positive and/or variant of uncertain significant result. We recommended [name] pursue genetic testing for the Common hereditary cancer gene panel and the melanoma gene panel.  The Hereditary Gene Panel offered by [facility] includes sequencing and/or deletion duplication testing of the following 46 genes: APC, ATM, AXIN2, BARD1, BMPR1A, BRCA1, BRCA2, BRIP1, CDH1, CDKN2A (p14ARF), CDKN2A (p16INK4a), CHEK2, CTNNA1, DICER1, EPCAM (Deletion/duplication testing only), GREM1 (promoter region deletion/duplication testing only), KIT, MEN1, MLH1, MSH2, MSH3, MSH6, MUTYH, NBN, NF1, NHTL1, PALB2, PDGFRA, PMS2, POLD1, POLE, PTEN, RAD50, RAD51C, RAD51D, SDHB, SDHC, SDHD, SMAD4, SMARCA4. STK11, TP53, TSC1, TSC2, and VHL.  The following genes were evaluated for sequence changes only: SDHA and HOXB13 c.251G>A variant only.  The Melanoma panel offered by [facility] includes sequencing and/or deletion duplication testing of the following 12  genes: BAP1, BRCA1, BRCA2, BRIP1, CDK4, CDKN2A (p14ARF), CDKN2A (p16INK4a), MC1R, POT1, PTEN, RB1, TERT, and TP53.  The following gene was evaluated for sequence changes only: MITF (c.952G>A, p.GLU318Lys variant only).   Based on [name]'s personal and family history of cancer, she meets medical criteria for genetic testing. Despite that she meets criteria, she may still have an out of pocket cost. We discussed that if her out of pocket cost for testing is over $100, the laboratory will call and confirm whether she wants to proceed with testing.  If the out of pocket cost of testing is less than $100 she will be billed by the genetic testing laboratory.   We discussed that some people do not want to undergo genetic testing due to fear of genetic discrimination.  A federal law called the Genetic Information Non-Discrimination Act (GINA) of 2008 helps protect individuals against genetic discrimination based  on their genetic test results.  It impacts both health insurance and employment.  With health insurance, it protects against increased premiums, being kicked off insurance or being forced to take a test in order to be insured.  For employment it protects against hiring, firing and promoting decisions based on genetic test results.  Health status due to a cancer diagnosis is not protected under GINA.  PLAN: After considering the risks, benefits, and limitations, [name]  provided informed consent to pursue genetic testing and the blood sample was sent to [facility] for analysis of the Common hereditary cancer panel and the melanoma panel. Results should be available within approximately 2-3 weeks' time, at which point they will be disclosed by telephone to [name], as will any additional recommendations warranted by these results. [name] will receive a summary of her genetic counseling visit and a copy of her results once available. This information will also be available in Epic. We  encouraged [name] to remain in contact with cancer genetics annually so that we can continuously update the family history and inform her of any changes in cancer genetics and testing that may be of benefit for her family. [name] questions were answered to her satisfaction today. Our contact information was provided should additional questions or concerns arise.  Lastly, we encouraged [name] to remain in contact with cancer genetics annually so that we can continuously update the family history and inform her of any changes in cancer genetics and testing that may be of benefit for this family.   Ms.  Miller questions were answered to her satisfaction today. Our contact information was provided should additional questions or concerns arise. Thank you for the referral and allowing [place] to share in the care of your patient.   [name], [place], [facility] Certified Genetic Counselor [email or web address] phone: [phone]  The patient was seen for a total of 45 minutes in face-to-face genetic counseling.  This patient was discussed with Drs. Magrinat, Lindi Adie and/or Burr Medico who agrees with the above.    _______________________________________________________________________ For Office Staff:  Number of people involved in session: 2 Was an Intern/ student involved with case: no

## 2016-11-24 NOTE — Progress Notes (Signed)
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  CHIEF COMPLAINT: Here to discuss management of right breast cancer  HISTORY OF PRESENT ILLNESS::[name] is a 76 y.o. female who presented with a palpable right breast mass one year ago. Mammogram and ultrasound at that time found fibrocystic changes. She was recommended for repeat diagnostic mammogram in one year. Annual repeat diagnostic mammogram and ultrasound were performed on 11/11/2016 revealing a 3.5 cm irregular solid mass in the right breast consistent with carcinoma and highly suggestive of malignancy.  Biopsy of the right breast at the 1 o'clock position was performed on 11/15/2016. This revealed invasive and in situ mammary carcinoma, grade 1-2, ER 90%/ PR 2%/ HER-2 negative/ Ki-67 5%. The malignant cells are negative for E-cadherin, supporting a lobular phenotype.  The patient is seen today in our multidisciplinary breast clinic.  Family history of cancer includes a maternal great aunt with breast cancer.  PREVIOUS RADIATION THERAPY: No  PAST MEDICAL HISTORY:  has a past medical history of Anxiety; Arthritis; Asthma; Cancer ([place]); Complication of anesthesia; Hypertension; Kidney stones; and Pneumonia.    PAST SURGICAL HISTORY: Past Surgical History:  Procedure Laterality Date  . BUNIONECTOMY     left   . EYE SURGERY     bilaterla cataract surgery   . KNEE ARTHROSCOPY     left   . polyp removed from  uterus     . right wrist pinning     . TOTAL KNEE ARTHROPLASTY Right 01/29/2013   Procedure: RIGHT TOTAL KNEE ARTHROPLASTY;  Surgeon: Gearlean Alf, MD;  Location: [facility];  Service: Orthopedics;  Laterality: Right;    FAMILY HISTORY: family history is not on file.  SOCIAL HISTORY:  reports that she has never smoked. She has never used smokeless tobacco. She reports that she drinks alcohol. She reports that she does not use drugs.  ALLERGIES: Codeine and Other  MEDICATIONS:  Current Outpatient Prescriptions  Medication Sig Dispense Refill  . aspirin EC 81 MG tablet Take 81 mg by mouth daily.    . B Complex-C (B-COMPLEX WITH VITAMIN C) tablet Take 1 tablet by mouth daily.    . budesonide-formoterol (SYMBICORT) 160-4.5 MCG/ACT inhaler Inhale 2 puffs into the lungs 2 (two) times daily. For allergy symptoms     . celecoxib (CELEBREX) 200 MG capsule Take 200 mg by mouth daily.    . cholecalciferol (VITAMIN D) 1000 units tablet Take 1,000 Units by mouth daily.    Marland Kitchen levocetirizine (XYZAL) 5 MG tablet Take 5 mg by mouth every evening.     Marland Kitchen omega-3 acid ethyl esters (LOVAZA) 1 g capsule Take 1 g by mouth daily.    . Potassium 99 MG TABS Take 1 tablet by mouth daily.      . propranolol ER (INDERAL LA) 80 MG 24 hr capsule Take 80 mg by mouth daily.    . Red Yeast Rice Extract POWD Take 1 tablet by mouth daily.    . RESTASIS 0.05 % ophthalmic emulsion Place 1 drop  into both eyes daily.     Marland Kitchen triamterene-hydrochlorothiazide (MAXZIDE-25) 37.5-25 MG per tablet Take 1 tablet by mouth every morning.     . TURMERIC PO Take 1 capsule by mouth daily.    Marland Kitchen UBIQUINOL PO Take 1 tablet by mouth daily.     No current facility-administered medications for this encounter.     Gynecologic History  Age at first menstrual period? 15  Are you still having periods? No Approximate date of last period? 1994  If you are still having periods: Are your periods regular? No  If you no longer have periods: Have you  used hormone replacement? No  If YES, for how long? N/A When did you stop? N/A Obstetric History:  How many children have you carried to term? 1 Your age at first live birth? 42  Pregnant now or trying to get pregnant? No  Have you used birth control pills or hormone shots for contraception? Yes  If so, for how long (or approximate dates)? [phone] (?)  Would you be interested in learning more about the options to preserve fertility? N/A Health Maintenance:  Have you ever had a colonoscopy? Yes If yes, date? 2015  Have you ever had a bone density? Yes If yes, date? 2018  Date of your last PAP smear? 2018 Date of your FIRST mammogram? N/A   REVIEW OF SYSTEMS:  On review of systems, the patient reports that she is doing well overall. She denies any chest pain, shortness of breath, cough, fevers, chills, night sweats, unintended weight changes. She denies any bowel or bladder disturbances, and denies abdominal pain, nausea or vomiting. She reports swelling of the ankles. She reports stabbing knee and elbow pain, as well as osteoarthritis. She reports a lump in the breast. She reports a history of skin cancer and eczema. She reports anemia at times. A complete review of systems is obtained and is otherwise negative.   PHYSICAL EXAM:  Vitals with BMI 11/24/2016  Height _0   Weight 147 lbs 3 oz  BMI 66.4  Systolic 403  Diastolic 72  Pulse 72  Respirations 18   General: Alert and oriented, in no acute distress HEENT: Head is normocephalic. Extraocular movements are intact. Oropharynx is clear. Neck: Neck is supple, no palpable cervical or supraclavicular lymphadenopathy. Heart: Regular in rate and rhythm with no murmurs, rubs, or gallops. Chest: Clear to auscultation bilaterally, with no rhonchi, wheezes, or rales. Abdomen: Soft, nontender, nondistended, with no rigidity or guarding. Extremities: No cyanosis or edema. Lymphatics: see Neck Exam Skin: No concerning lesions. Long scars  present on back of both arms Musculoskeletal: symmetric strength and muscle tone throughout. Neurologic: Cranial nerves II through XII are grossly intact. No obvious focalities. Speech is fluent. Coordination is intact. Psychiatric: Judgment and insight are intact. Affect is appropriate. Breast: Left breast has some thickening in the upper central aspect of the breast without discrete mass, better noticed in the recumbent position. Right breast has a palpable mass measuring approximately 3.5 by 4 cm in the 1 o'clock position of the right breast. It is mobile and there is no skin or chest wall involvement. No nipple discharge or bleeding. Some bruising noted from recent biopsy.   ECOG = 1  0 - Asymptomatic (Fully active, able to carry on all predisease activities without restriction)  1 - Symptomatic but completely ambulatory (Restricted in physically strenuous activity but ambulatory and able to carry out work of a light or sedentary nature. For example, light housework, office work)  2 - Symptomatic, <50% in bed during the day (Ambulatory and capable of all self care but unable to carry out any work activities. Up and about more than 50% of waking hours)  3 - Symptomatic, >50% in bed, but not bedbound (Capable of only limited self-care, confined to bed or chair 50% or more of waking hours)  4 - Bedbound (Completely disabled. Cannot carry on any self-care. Totally confined to bed or chair)  5 - Death   Eustace Pen MM, Creech RH, Tormey DC, et al. [phone]). "Toxicity and response criteria of the [facility]". [place] Oncol. 5 (6): 649-55  LABORATORY DATA:  Lab Results  Component Value Date   WBC 5.7 11/24/2016   HGB 13.4 11/24/2016   HCT 39.7 11/24/2016   MCV 97.2 11/24/2016   PLT 220 11/24/2016   NEUTROABS 3.2 11/24/2016   Lab Results  Component Value Date   NA 139 11/24/2016   K 3.8 11/24/2016   CL 95 (L) 06/03/2016   CO2 26 11/24/2016   GLUCOSE 104  11/24/2016   CREATININE 0.8 11/24/2016   CALCIUM 9.8 11/24/2016      RADIOGRAPHY: No results found.    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 In light of the lobular histology, she will proceed with an MRI of the breast. She will also have ultrasound of the 12 o'clock position of the left breast to further evaluate the thickening in the upper left breast. Patient also proceed with neoadjuvant hormonal therapy depending additional imaging.  PLAN: Proceed with breast MRI. Surgery to be determined pending MRI results. Will consider radiation therapy if the patient ends up having breast conserving therapy.  Genetics   ------------------------------------------------  Blair Promise, PhD, MD   This document serves as a record of services personally performed by Gery Pray, MD. It was created on his behalf by Arlyce Harman, a trained medical scribe. The creation of this record is based on the scribe's personal observations and the provider's statements to them. This document has been checked and approved by the attending provider.

## 2016-11-24 NOTE — Patient Instructions (Signed)

## 2016-11-25 ENCOUNTER — Ambulatory Visit: Payer: Medicare Other | Attending: Oncology | Admitting: Physical Therapy

## 2016-11-25 ENCOUNTER — Encounter: Payer: Self-pay | Admitting: Physical Therapy

## 2016-11-25 DIAGNOSIS — M62838 Other muscle spasm: Secondary | ICD-10-CM | POA: Diagnosis present

## 2016-11-25 NOTE — Patient Instructions (Signed)
Moisturizers . They are used in the vagina to hydrate the mucous membrane that make up the vaginal canal. . Designed to keep a more normal acid balance (ph) . Once placed in the vagina, it will last between two to three days.  . Use 2-3 times per week at bedtime and last longer than 60 min. . Ingredients to avoid is glycerin and fragrance, can increase chance of infection . Should not be used just before sex due to causing irritation . Most are gels administered either in a tampon-shaped applicator or as a vaginal suppository. They are non-hormonal.   Types of Moisturizers . Samul Dada- drug store . Vitamin E vaginal suppositories- Whole foods . Moist Again . Coconut oil- can break down condoms . Michail Jewels . Yes moisturizer- amazon  Things to avoid in the vaginal area . Do not use things to irritate the vulvar area . No lotions just specialized creams for the vulva area- Neogyn, V-magic, [place] . No soaps; can use Aveeno or Calendula cleanser if needed. Must be gentle . No deodorants . No douches . Good to sleep without underwear to let the vaginal area to air out . No scrubbing: spread the lips to let warm water rinse over labias and pat dry    Lubrication . Used for intercourse to reduce friction . Avoid ones that have glycerin, warming gels, tingling gels, icing or cooling gel, scented . Avoid parabens due to a preservative similar to female sex hormone . May need to be reapplied once or several times during sexual activity . Can be applied to both partners genitals prior to vaginal penetration to minimize friction or irritation . Prevent irritation and mucosal tears that cause post coital pain and increased the risk of vaginal and urinary tract infections . Oil-based lubricants cannot be used with condoms due to breaking them down.  Least likely to irritate vaginal tissue.  . Plant based-lubes are safe . Silicone-based lubrication are thicker and  last long and used for post-menopausal women  Vaginal Lubricators Here is a list of some suggested lubricators you can use for intercourse. Use the most hypoallergenic product.  You can place on you or your partner.   Astroglide natural in green box  K-Y Intrique- silicone base  Good Clean Love -CVS,Target, [employer], [employer] (water based)  Slippery Stuff  Sylk, Sliquid Natural H2O ( good  if frequent UTI's)  Blossom Organics (www.blossom-organics.com)  Samul Dada, Coconut oil  PJur Woman Nude- water based lubricant, [place], good for cancer patients with radiation  Yes lubricant- [place] . Wet Platinum-Silicone, Target, Walgreens Things to avoid in lubricants are glycerin, warming gels, tingling gels, icing or cooling  gels, and scented gels.  Also avoid Vaseline. KY jelly, Replens, and Astroglide kills good bacteria(lactobacilli)  Things to avoid in the vaginal area . Do not use things to irritate the vulvar area . No lotions . No soaps; can use Aveeno or Calendula cleanser if needed. Must be gentle . No deodorants . No douches . Good to sleep without underwear to let the vaginal area to air out . No scrubbing: spread the lips to let warm water rinse over labias and pat dry   [facility] [address] [place], [place] [address] Phone # [phone] Fax [phone]

## 2016-11-25 NOTE — Therapy (Signed)
[facility] [address], [place], [place], [address] Phone: [phone]   Fax:  [phone]  Physical Therapy Evaluation  Patient Details  Name: [name] MRN: [id number] Date of Birth: 11/25/1940 Referring Provider: Dr. Lurline Del  Encounter Date: 11/25/2016      PT End of Session - 11/25/16 1208    Visit Number 1   Number of Visits 1   PT Start Time 0845   PT Stop Time 0930   PT Time Calculation (min) 45 min   Activity Tolerance Patient tolerated treatment well   Behavior During Therapy [facility] for tasks assessed/performed      Past Medical History:  Diagnosis Date  . Anxiety   . Arthritis   . Asthma   . Cancer ([place])    skin cancers, one melanoma   . Complication of anesthesia    patient woke up during colonoscopy  . Family history of breast cancer   . Hypertension   . Kidney stones    hx of   . Melanoma ([place])   . Pneumonia    hx of     Past Surgical History:  Procedure Laterality Date  . BUNIONECTOMY     left   . EYE SURGERY     bilaterla cataract surgery   . KNEE ARTHROSCOPY     left   . polyp removed from uterus     . right wrist pinning     . TOTAL KNEE ARTHROPLASTY Right 01/29/2013   Procedure: RIGHT TOTAL KNEE ARTHROPLASTY;  Surgeon: Gearlean Alf, MD;  Location: [facility];  Service: Orthopedics;  Laterality: Right;    There were no vitals filed for this visit.       Subjective Assessment - 11/25/16 0856    Subjective I have breast cancer and not taking premarin anymore due to estrogen positive breast cancer.    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 It measures 3.5 cm and is located in the upper inner quadrant. It is ER/PR positive and HER2 negative. She had a traumatic fall 10/12/16 which resulted in a left elbow dislocation which is still healing and  appears to be swollen. She had a right total knee replacement in 2014 and has a left total knee replacement scheduled for 01/10/17.   Patient Stated Goals education on pelvic floor health   Currently in Pain? No/denies   Multiple Pain Sites No            [facility] PT Assessment - 11/25/16 0001      Assessment   Referring Provider Dr. Sarajane Jews Magrinat   Onset Date/Surgical Date 11/11/16   Hand Dominance Right   Prior Therapy none     Precautions   Precautions Other (comment)   Precaution Comments active cancer     Restrictions   Weight Bearing Restrictions No     Balance Screen   Has the patient fallen in the past 6 months Yes   How many times? 1   Has the patient had a decrease in activity level because of a fear of falling?  No   Is the patient reluctant to leave their home because of a fear of falling?  No     Prior Function   Level of Independence Independent   Vocation Retired   Leisure She does yoga, works out with a Clinical research associate 2x/wk, and exercises at  [facility] [employer]     Cognition   Overall Cognitive Status Within Functional Limits for tasks assessed     Observation/Other Assessments   Focus on Therapeutic Outcomes (FOTO)  17% limitation     Posture/Postural Control   Posture/Postural Control Postural limitations   Postural Limitations Rounded Shoulders;Forward head     Strength   Overall Strength Within functional limits for tasks performed  bil. hip strength is 5/5     Transfers   Transfers Not assessed     Ambulation/Gait   Ambulation/Gait No           LYMPHEDEMA/ONCOLOGY QUESTIONNAIRE - 11/24/16 1258      Type   Cancer Type Right breast cancer     Lymphedema Assessments   Lymphedema Assessments Upper extremities     Right Upper Extremity Lymphedema   10 cm Proximal to Olecranon Process 33.2 cm   Olecranon Process 25.4 cm   10 cm Proximal to Ulnar Styloid Process 22.8 cm   Just Proximal to Ulnar Styloid Process 16 cm   Across Hand at [facility]  17.4 cm   At [place] of 2nd Digit 6.3 cm     Left Upper Extremity Lymphedema   10 cm Proximal to Olecranon Process 34.5 cm   Olecranon Process 28.1 cm   10 cm Proximal to Ulnar Styloid Process 22.5 cm   Just Proximal to Ulnar Styloid Process 16.3 cm   Across Hand at [facility] 17 cm   At [place] of 2nd Digit 5.9 cm         Objective measurements completed on examination: See above findings.                  PT Education - 11/24/16 1302    Education provided Yes   Education Details Lymphedema risk reduction and post op shoulder ROM HEP   Person(s) Educated Patient;Spouse   Methods Explanation;Demonstration;Handout   Comprehension Returned demonstration;Verbalized understanding             PT Long Term Goals - 11/25/16 1207      PT LONG TERM GOAL #1   Title educated on vaginal moisturizers and lubricants for vaginal care and reduction of vaginal dryness   Time 1   Period Days   Status Achieved         Breast Clinic Goals - 11/24/16 1325      Patient will be able to verbalize understanding of pertinent lymphedema risk reduction practices relevant to her diagnosis specifically related to skin care.   Time 1   Period Days   Status Achieved     Patient will be able to return demonstrate and/or verbalize understanding of the post-op home exercise program related to regaining shoulder range of motion.   Time 1   Period Days   Status Achieved     Patient will be able to verbalize understanding of the importance of attending the postoperative After Breast Cancer Class for further lymphedema risk reduction education and therapeutic exercise.   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 It measures 3.5 cm and is located in the upper inner quadrant. It is ER/PR positive and HER2 negative. She had  a traumatic fall 10/12/16 which resulted in a left elbow dislocation which is  still healing and appears to be swollen. She had a right total knee replacement in 2014 and has a left total knee replacement scheduled for 01/10/17. She expressed concerns about vaginal dryness and having to stop her Premarin.  Patient reports she likes to use the applicator to apply premarin due to it opening up the vaginal canal.  Patient understands ways to use other sources of moisturizers for the vaginal canal that are safe and effective. Patient understands lubricants to moisturizes the perineal area and penis to reduce friction with intercourse     History and Personal Factors relevant to plan of care: Possible fall risk, recent traumatic fall down steps with elbow dislocation, upcoming left total knee replacement 01/10/17   Clinical Presentation Evolving   Clinical Presentation due to: Awaiting MRI results which may change the treatment plan; has several comorbidities that could impact rehab   Clinical Decision Making Moderate   Rehab Potential Excellent   Clinical Impairments Affecting Rehab Potential Above stated comorbidities   PT Frequency One time visit   PT Treatment/Interventions Patient/family education;Therapeutic exercise   PT Next Visit Plan  will f/u with cancer rehab PT post operatively   PT Home Exercise Plan HEP    Recommended Other Services None   Consulted and Agree with Plan of Care Patient;Family member/caregiver   Family Member Consulted Husband      Patient will benefit from skilled therapeutic intervention in order to improve the following deficits and impairments:  Other (comment) (uneducated on vaginal moisturizers and lubricants)  Visit Diagnosis: Other muscle spasm - Plan: PT plan of care cert/re-cert      G-Codes - 24/82/50 1329    Functional Assessment Tool Used (Outpatient Only) FOTO socre is 17% limitation    Functional Limitation Other PT primary   Other PT Primary Current Status  (I3704) At least 1 percent but less than 20 percent impaired, limited or restricted   Other PT Primary Goal Status (U8891) At least 1 percent but less than 20 percent impaired, limited or restricted   Other PT Primary Discharge Status (Q9450) At least 1 percent but less than 20 percent impaired, limited or restricted       Problem List Patient Active Problem List   Diagnosis Date Noted  . Family history of breast cancer   . Melanoma ([place])   . Malignant neoplasm of upper-inner quadrant of right breast in female, estrogen receptor positive ([place]) 11/19/2016  . Postoperative anemia due to acute blood loss 02/15/2013  . OA (osteoarthritis) of knee 01/29/2013    Earlie Counts, PT 11/25/16 1:32 PM   [facility] [address], [place], [place], [address] Phone: [phone]   Fax:  [phone]  Name: [name] MRN: [id number] Date of Birth: May 17, 1941

## 2016-11-26 ENCOUNTER — Ambulatory Visit (HOSPITAL_COMMUNITY)
Admission: RE | Admit: 2016-11-26 | Discharge: 2016-11-26 | Disposition: A | Discharge status: Home / Self Care | Payer: Medicare Other | Source: Ambulatory Visit | Attending: General Surgery | Admitting: General Surgery

## 2016-11-26 DIAGNOSIS — N6311 Unspecified lump in the right breast, upper outer quadrant: Secondary | ICD-10-CM | POA: Diagnosis not present

## 2016-11-26 DIAGNOSIS — C50211 Malignant neoplasm of upper-inner quadrant of right female breast: Secondary | ICD-10-CM | POA: Insufficient documentation

## 2016-11-26 DIAGNOSIS — Z170 Estrogen receptor positive status [ER+]: Secondary | ICD-10-CM | POA: Insufficient documentation

## 2016-11-26 MED ORDER — GADOBENATE DIMEGLUMINE 529 MG/ML IV SOLN
15.0000 mL | Freq: Once | INTRAVENOUS | Status: AC | PRN
  Administered 2016-11-26: 13 mL via INTRAVENOUS

## 2016-11-30 ENCOUNTER — Telehealth: Payer: Self-pay | Admitting: *Deleted

## 2016-11-30 NOTE — Telephone Encounter (Signed)
  Oncology Nurse Navigator Documentation  Navigator Location: [facility] (11/30/16 1000)   )Navigator Encounter Type: Telephone (11/30/16 1000) Telephone: [facility] Crocker Call;Clinic/[facility] Follow-up (11/30/16 1000)                       Barriers/Navigation Needs: No barriers at this time;No Questions;No Needs (11/30/16 1000)                          Time Spent with Patient: 15 (11/30/16 1000)

## 2016-12-01 ENCOUNTER — Other Ambulatory Visit: Payer: Self-pay | Admitting: Radiology

## 2016-12-01 DIAGNOSIS — R928 Other abnormal and inconclusive findings on diagnostic imaging of breast: Secondary | ICD-10-CM

## 2016-12-05 DIAGNOSIS — I499 Cardiac arrhythmia, unspecified: Secondary | ICD-10-CM

## 2016-12-05 HISTORY — DX: Cardiac arrhythmia, unspecified: I49.9

## 2016-12-13 ENCOUNTER — Ambulatory Visit
Admission: RE | Admit: 2016-12-13 | Discharge: 2016-12-13 | Disposition: A | Discharge status: Home / Self Care | Payer: Medicare Other | Source: Ambulatory Visit | Attending: Radiology | Admitting: Radiology

## 2016-12-13 DIAGNOSIS — R928 Other abnormal and inconclusive findings on diagnostic imaging of breast: Secondary | ICD-10-CM

## 2016-12-13 MED ORDER — GADOBENATE DIMEGLUMINE 529 MG/ML IV SOLN
13.0000 mL | Freq: Once | INTRAVENOUS | Status: AC | PRN
  Administered 2016-12-13: 13 mL via INTRAVENOUS

## 2016-12-14 ENCOUNTER — Emergency Department (HOSPITAL_BASED_OUTPATIENT_CLINIC_OR_DEPARTMENT_OTHER): Payer: Medicare Other

## 2016-12-14 ENCOUNTER — Emergency Department (HOSPITAL_BASED_OUTPATIENT_CLINIC_OR_DEPARTMENT_OTHER)
Admission: EM | Admit: 2016-12-14 | Discharge: 2016-12-15 | Disposition: A | Discharge status: Home / Self Care | Payer: Medicare Other | Attending: Emergency Medicine | Admitting: Emergency Medicine

## 2016-12-14 ENCOUNTER — Encounter (HOSPITAL_BASED_OUTPATIENT_CLINIC_OR_DEPARTMENT_OTHER): Payer: Self-pay

## 2016-12-14 DIAGNOSIS — Z7982 Long term (current) use of aspirin: Secondary | ICD-10-CM | POA: Insufficient documentation

## 2016-12-14 DIAGNOSIS — I10 Essential (primary) hypertension: Secondary | ICD-10-CM | POA: Diagnosis not present

## 2016-12-14 DIAGNOSIS — E876 Hypokalemia: Secondary | ICD-10-CM

## 2016-12-14 DIAGNOSIS — Z85820 Personal history of malignant melanoma of skin: Secondary | ICD-10-CM | POA: Diagnosis not present

## 2016-12-14 DIAGNOSIS — J45909 Unspecified asthma, uncomplicated: Secondary | ICD-10-CM | POA: Diagnosis not present

## 2016-12-14 DIAGNOSIS — Z96651 Presence of right artificial knee joint: Secondary | ICD-10-CM | POA: Diagnosis not present

## 2016-12-14 DIAGNOSIS — I4891 Unspecified atrial fibrillation: Secondary | ICD-10-CM | POA: Diagnosis not present

## 2016-12-14 DIAGNOSIS — Z79899 Other long term (current) drug therapy: Secondary | ICD-10-CM | POA: Insufficient documentation

## 2016-12-14 DIAGNOSIS — R9431 Abnormal electrocardiogram [ECG] [EKG]: Secondary | ICD-10-CM | POA: Diagnosis present

## 2016-12-14 DIAGNOSIS — Z853 Personal history of malignant neoplasm of breast: Secondary | ICD-10-CM | POA: Insufficient documentation

## 2016-12-14 HISTORY — DX: Malignant neoplasm of unspecified site of unspecified female breast: C50.919

## 2016-12-14 LAB — CBC
HCT: 38.6 % (ref 36.0–46.0)
Hemoglobin: 13.3 g/dL (ref 12.0–15.0)
MCH: 33.2 pg (ref 26.0–34.0)
MCHC: 34.5 g/dL (ref 30.0–36.0)
MCV: 96.3 fL (ref 78.0–100.0)
Platelets: 198 K/uL (ref 150–400)
RBC: 4.01 MIL/uL (ref 3.87–5.11)
RDW: 12.2 % (ref 11.5–15.5)
WBC: 6 K/uL (ref 4.0–10.5)

## 2016-12-14 LAB — BASIC METABOLIC PANEL
Anion gap: 12 (ref 5–15)
BUN: 15 mg/dL (ref 6–20)
CALCIUM: 9.8 mg/dL (ref 8.9–10.3)
CO2: 25 mmol/L (ref 22–32)
CREATININE: 0.7 mg/dL (ref 0.44–1.00)
Chloride: 96 mmol/L — ABNORMAL LOW (ref 101–111)
GFR calc Af Amer: >60 mL/min (ref >60)
GFR calc non Af Amer: >60 mL/min (ref >60)
GLUCOSE: 96 mg/dL (ref 65–99)
Potassium: 3 mmol/L — ABNORMAL LOW (ref 3.5–5.1)
Sodium: 133 mmol/L — ABNORMAL LOW (ref 135–145)

## 2016-12-14 NOTE — ED Notes (Signed)
Pt denies CP, ShOB, diaphoresis, or lightheadedness.

## 2016-12-14 NOTE — ED Triage Notes (Addendum)
Pt states her PCP advised her to come to ED r/t to abnormal EKG today from pre-op knee surgery EKG reading Afib-pt denies hx-pt NAD-steady gait

## 2016-12-15 ENCOUNTER — Other Ambulatory Visit: Payer: Self-pay | Admitting: Oncology

## 2016-12-15 MED ORDER — POTASSIUM CHLORIDE CRYS ER 20 MEQ PO TBCR
40.0000 meq | EXTENDED_RELEASE_TABLET | Freq: Once | ORAL | Status: AC
  Administered 2016-12-15: 40 meq via ORAL
  Filled 2016-12-15: qty 2

## 2016-12-15 MED ORDER — RIVAROXABAN 15 MG PO TABS
15.0000 mg | ORAL_TABLET | Freq: Once | ORAL | Status: AC
  Administered 2016-12-15: 15 mg via ORAL
  Filled 2016-12-15: qty 1

## 2016-12-15 MED ORDER — RIVAROXABAN 20 MG PO TABS: 20.0000 mg | ORAL_TABLET | Freq: Every day | ORAL | 0 refills | Status: DC

## 2016-12-15 NOTE — Discharge Instructions (Signed)
Follow up with cardiology as arranged by Dr. [name].

## 2016-12-15 NOTE — ED Notes (Signed)
ED Provider at bedside. 

## 2016-12-15 NOTE — ED Provider Notes (Addendum)
[facility] Provider Note: Gabriella Spurling, MD, FACEP  CSN: [id number] MRN: [id number] ARRIVAL: 12/14/16 at [facility]: [place]  Abnormal ECG   HISTORY OF PRESENT ILLNESS  [name] is a 76 y.o. female who was seeing her primary care physician yesterday for routine preoperative evaluation. An EKG showed atrial fibrillation. The patient has no history of atrial fibrillation. She has been asymptomatic with the atrial fibrillation and so it is unknown when it began. Her last EKG was in December of last year which showed normal sinus rhythm. She denies chest pain, shortness of breath, nausea, vomiting or diaphoresis. Her rate is controlled. She is not on anticoagulants.   Past Medical History:  Diagnosis Date  . Anxiety   . Arthritis   . Asthma   . Breast cancer ([place])   . Cancer ([place])    skin cancers, one melanoma   . Complication of anesthesia    patient woke up during colonoscopy  . Family history of breast cancer   . Hypertension   . Kidney stones    hx of   . Melanoma ([place])   . Pneumonia    hx of     Past Surgical History:  Procedure Laterality Date  . BUNIONECTOMY     left   . EYE SURGERY     bilaterla cataract surgery   . KNEE ARTHROSCOPY     left   . polyp removed from uterus     . right wrist pinning     . TOTAL KNEE ARTHROPLASTY Right 01/29/2013   Procedure: RIGHT TOTAL KNEE ARTHROPLASTY;  Surgeon: Gearlean Alf, MD;  Location: [facility];  Service: Orthopedics;  Laterality: Right;    Family History  Problem Relation Age of Onset  . COPD Father   . Breast cancer Other        dx in her 31s-30s    Social History  Substance Use Topics  . Smoking status: Never Smoker  . Smokeless tobacco: Never Used  . Alcohol use Yes     Comment: occ    Prior to Admission medications   Medication Sig Start Date End Date Taking? Authorizing Provider  anastrozole (ARIMIDEX) 1 MG tablet Take 1 tablet (1 mg total) by mouth daily.  11/24/16   Magrinat, Virgie Dad, MD  aspirin EC 81 MG tablet Take 81 mg by mouth daily.    [provider]  B Complex-C (B-COMPLEX WITH VITAMIN C) tablet Take 1 tablet by mouth daily.    [provider]  budesonide-formoterol (SYMBICORT) 160-4.5 MCG/ACT inhaler Inhale 2 puffs into the lungs 2 (two) times daily. For allergy symptoms     [provider]  celecoxib (CELEBREX) 200 MG capsule Take 200 mg by mouth daily.    [provider]  cholecalciferol (VITAMIN D) 1000 units tablet Take 1,000 Units by mouth daily.    [provider]  levocetirizine (XYZAL) 5 MG tablet Take 5 mg by mouth every evening.     [provider]  omega-3 acid ethyl esters (LOVAZA) 1 g capsule Take 1 g by mouth daily.    [provider]  Potassium 99 MG TABS Take 1 tablet by mouth daily.      [provider]  propranolol ER (INDERAL LA) 80 MG 24 hr capsule Take 80 mg by mouth daily.    [provider]  Red Yeast Rice Extract POWD Take 1 tablet by mouth daily.    [provider]  RESTASIS 0.05 % ophthalmic emulsion Place 1 drop into both eyes daily.  04/20/16   [provider]  triamterene-hydrochlorothiazide (MAXZIDE-25) 37.5-25 MG per tablet Take 1 tablet by mouth every morning.     [provider]  TURMERIC PO Take 1 capsule by mouth daily.    [provider]  UBIQUINOL PO Take 1 tablet by mouth daily.    [provider]    Allergies Codeine and Other   REVIEW OF SYSTEMS  Negative except as noted here or in the History of Present Illness.   PHYSICAL EXAMINATION  Initial Vital Signs Blood pressure 138/72, pulse 74, temperature 98.4 F (36.9 C), temperature source Oral, resp. rate 19, height 5' 0.5" (1.537 m), weight 66.5 kg (146 lb 11.2 oz), SpO2 99 %.  Examination General: Well-developed, well-nourished female in no acute distress; appearance consistent with age of record HENT:  normocephalic; atraumatic Eyes: pupils equal, round and reactive to light; extraocular muscles intact Neck: supple Heart: irregular rhythm Lungs: clear to auscultation bilaterally Abdomen: soft; nondistended; nontender; no masses or hepatosplenomegaly; bowel sounds present Extremities: No deformity; full range of motion; pulses normal Neurologic: Awake, alert and oriented; motor function intact in all extremities and symmetric; no facial droop Skin: Warm and dry Psychiatric: Normal mood and affect   RESULTS  Summary of this visit's results, reviewed by myself:   EKG Interpretation  Date/Time:  Tuesday December 14 2016 22:38:08 EDT Ventricular Rate:  78 PR Interval:    QRS Duration: 80 QT Interval:  366 QTC Calculation: 417 R Axis:   55 Text Interpretation:  Atrial fibrillation Nonspecific ST abnormality Abnormal ECG Previously NSR Confirmed by Rashida Ladouceur [phone]) on 12/14/2016 10:49:21 PM      Laboratory Studies: Results for orders placed or performed during the hospital encounter of 12/14/16 (from the past 24 hour(s))  Basic metabolic panel     Status: Abnormal   Collection Time: 12/14/16 11:38 PM  Result Value Ref Range   Sodium 133 (L) 135 - 145 mmol/L   Potassium 3.0 (L) 3.5 - 5.1 mmol/L   Chloride 96 (L) 101 - 111 mmol/L   CO2 25 22 - 32 mmol/L   Glucose, Bld 96 65 - 99 mg/dL   BUN 15 6 - 20 mg/dL   Creatinine, Ser 0.70 0.44 - 1.00 mg/dL   Calcium 9.8 8.9 - 10.3 mg/dL   GFR calc non Af Amer >60 >60 mL/min   GFR calc Af Amer >60 >60 mL/min   Anion gap 12 5 - 15  CBC     Status: None   Collection Time: 12/14/16 11:41 PM  Result Value Ref Range   WBC 6.0 4.0 - 10.5 K/uL   RBC 4.01 3.87 - 5.11 MIL/uL   Hemoglobin 13.3 12.0 - 15.0 g/dL   HCT 38.6 36.0 - 46.0 %   MCV 96.3 78.0 - 100.0 fL   MCH 33.2 26.0 - 34.0 pg   MCHC 34.5 30.0 - 36.0 g/dL   RDW 12.2 11.5 - 15.5 %   Platelets 198 150 - 400 K/uL   Imaging Studies: Dg Chest 2 View  Result Date:  12/15/2016 CLINICAL DATA:  76 year old female with new onset AFib. EXAM: CHEST  2 VIEW COMPARISON:  Chest radiograph dated 06/08/2016 FINDINGS: There bibasilar atelectatic changes/ scarring. No focal consolidation, pleural effusion, or pneumothorax. The cardiac silhouette is within normal limits with no acute osseous pathology. IMPRESSION: No active cardiopulmonary disease. Electronically Signed   By: Anner Crete M.D.   On: 12/15/2016  00:04   Mm Clip Placement Right  Result Date: 12/13/2016 CLINICAL DATA:  Confirmation of clip placement after MRI guided biopsy of an enhancing 7 mm mass in the outer right breast at anterior to middle depth. 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 EXAM: DIAGNOSTIC RIGHT MAMMOGRAM POST MRI BIOPSY COMPARISON:  Previous exam(s). FINDINGS: Mammographic images were obtained following MRI guided biopsy of an enhancing mass in outer right breast at anterior to middle depth. The dumbbell-shaped tissue marker clip is appropriately positioned at the expected location of the enhancing mass in the outer right breast at the level of the nipple. Expected post biopsy changes are present without evidence of hematoma. IMPRESSION: Appropriate positioning of the dumbbell-shaped tissue marker clip at the expected site of the biopsied mass in the outer right breast at anterior to middle depth at the level of the nipple. Final Assessment: Post Procedure Mammograms for Marker Placement Electronically Signed   By: Evangeline Dakin M.D.   On: 12/13/2016 11:29   Mr Rt Breast Bx Johnella Moloney Dev 1st Lesion Image Bx Spec Mr Guide  Addendum Date: 12/14/2016   ADDENDUM REPORT: 12/14/2016 10:08 ADDENDUM: Pathology revealed grade I invasive mammary carcinoma, complex sclerosing lesion with fibrocystic change, usual ductal hyperplasia and fibroadenomatoid change in the outer RIGHT breast. This was found to be concordant by Dr. Peggye Fothergill.  Pathology results were discussed with the patient by telephone. The patient reported doing well after the biopsy. Post biopsy instructions and care were reviewed and questions were answered. The patient was encouraged to call [facility] for any additional concerns. The patient has a recent diagnosis of RIGHT upper inner quadrant breast cancer and should follow her outlined treatment plan. Pathology results reported by Susa Raring RN, BSN on 12/14/2016. Electronically Signed   By: Evangeline Dakin M.D.   On: 12/14/2016 10:08   Result Date: 12/14/2016 CLINICAL DATA:  76 year old with biopsy-proven invasive lobular carcinoma and LCIS in the upper inner quadrant of the right breast, shown on preoperative MRI to have an enhancing 7 mm mass in the outer right breast at anterior to middle depth at the level of the nipple, demonstrating washout kinetics. Biopsy is requested to exclude multicentric disease. EXAM: MRI GUIDED CORE NEEDLE BIOPSY OF THE RIGHT BREAST TECHNIQUE: Multiplanar, multisequence MR imaging of the right breast was performed both before and after administration of intravenous contrast. CONTRAST:  13 mL MultiHance IV. COMPARISON:  Bilateral breast MRI 11/26/2016. Prior mammography and ultrasound from [facility]. FINDINGS: I met with the patient, and we discussed the procedure of MRI guided biopsy, including risks, benefits, and alternatives. Specifically, we discussed the risks of infection, bleeding, tissue injury, clip migration, and inadequate sampling. Informed, written consent was given. The usual time out protocol was performed immediately prior to the procedure. Lesion quadrant:  Upper outer quadrant. Using sterile technique with Betadine and alcohol as skin antisepsis, 1% lidocaine and 1% lidocaine with epinephrine as local anesthesia, using MRI guidance, a 9 gauge vacuum assisted device, biopsy was used to perform core needle biopsy of the mass in the outer  right breast at anterior to middle depth using a lateral approach. At the conclusion of the procedure, a dumbbell-shaped tissue marker clip was deployed into the biopsy cavity. Follow-up 2-view mammogram was performed and dictated separately. IMPRESSION: MRI guided biopsy of a 7 mm mass in the outer right breast at anterior to middle depth. No apparent complications. Electronically  Signed: By: Evangeline Dakin M.D. On: 12/13/2016 10:50    ED COURSE  Nursing notes and initial vitals signs, including pulse oximetry, reviewed.  Vitals:   12/14/16 2232 12/14/16 2330 12/15/16 0109 12/15/16 0110  BP: (!) 177/107 (!) 142/78  138/72  Pulse: 81 64  74  Resp: '20 11  19  '$ Temp: 98.4 F (36.9 C)     TempSrc: Oral     SpO2: 97% 97%  99%  Weight:   66.5 kg (146 lb 11.2 oz)   Height:   5' 0.5" (1.537 m)    2:10 AM Discussed with Dr. Judie Grieve of cardiology. He recommends the patient be started on an anticoagulant. He does not believe admission is indicated at this time as her rate is controlled. Her primary care physician is arranging to have her seen by cardiology as an outpatient. We will discharge her on Xarelto. She was advised to hold her Celebrex pending follow-up with her PCP or cardiologist.  PROCEDURES    ED DIAGNOSES     ICD-10-CM   1. Atrial fibrillation with normal ventricular rate (HCC) I48.91   2. Hypokalemia E87.6        Ami Thornsberry, Jenny Reichmann, MD 12/15/16 [id number]    Shanon Rosser, MD 12/15/16 0214    Shanon Rosser, MD 12/15/16 3016

## 2016-12-16 ENCOUNTER — Encounter: Payer: Self-pay | Admitting: Cardiology

## 2016-12-16 ENCOUNTER — Telehealth: Payer: Self-pay | Admitting: *Deleted

## 2016-12-16 DIAGNOSIS — Z7901 Long term (current) use of anticoagulants: Secondary | ICD-10-CM | POA: Insufficient documentation

## 2016-12-16 DIAGNOSIS — I119 Hypertensive heart disease without heart failure: Secondary | ICD-10-CM | POA: Insufficient documentation

## 2016-12-16 DIAGNOSIS — I4819 Other persistent atrial fibrillation: Secondary | ICD-10-CM

## 2016-12-16 HISTORY — DX: Other persistent atrial fibrillation: I48.19

## 2016-12-16 NOTE — Telephone Encounter (Signed)
Gave pt appt information to see Dr. [name] on 12/17/16 at 10:45. Gave directions. Denies further questions at this time.

## 2016-12-20 ENCOUNTER — Other Ambulatory Visit: Payer: Self-pay | Admitting: Oncology

## 2016-12-20 ENCOUNTER — Telehealth: Payer: Self-pay | Admitting: Oncology

## 2016-12-20 NOTE — Telephone Encounter (Signed)
lvm to inform pt of r/s 8/9 appt to8/17 at 1130 per sch msg

## 2016-12-24 ENCOUNTER — Encounter: Payer: Self-pay | Admitting: Genetic Counselor

## 2016-12-24 ENCOUNTER — Ambulatory Visit: Payer: Self-pay | Admitting: Genetic Counselor

## 2016-12-24 ENCOUNTER — Ambulatory Visit: Payer: Self-pay | Admitting: General Surgery

## 2016-12-24 ENCOUNTER — Telehealth: Payer: Self-pay | Admitting: Genetic Counselor

## 2016-12-24 DIAGNOSIS — Z803 Family history of malignant neoplasm of breast: Secondary | ICD-10-CM

## 2016-12-24 DIAGNOSIS — C50211 Malignant neoplasm of upper-inner quadrant of right female breast: Secondary | ICD-10-CM

## 2016-12-24 DIAGNOSIS — Z1379 Encounter for other screening for genetic and chromosomal anomalies: Secondary | ICD-10-CM | POA: Insufficient documentation

## 2016-12-24 DIAGNOSIS — C50811 Malignant neoplasm of overlapping sites of right female breast: Secondary | ICD-10-CM

## 2016-12-24 DIAGNOSIS — C4362 Malignant melanoma of left upper limb, including shoulder: Secondary | ICD-10-CM

## 2016-12-24 DIAGNOSIS — Z170 Estrogen receptor positive status [ER+]: Secondary | ICD-10-CM

## 2016-12-24 NOTE — Progress Notes (Signed)
HPI: Ms. Tassin was previously seen in the [place] [facility] clinic due to a personal and family history of cancer and concerns regarding a hereditary predisposition to cancer. Please refer to our prior cancer genetics clinic note for more information regarding Ms. Topel's medical, social and family histories, and our assessment and recommendations, at the time. Ms. Harlacher recent genetic test results were disclosed to her, as were recommendations warranted by these results. These results and recommendations are discussed in more detail below.  CANCER HISTORY:    Malignant neoplasm of upper-inner quadrant of right breast in female, estrogen receptor positive ([place])   11/19/2016 Initial Diagnosis    Malignant neoplasm of upper-inner quadrant of right breast in female, estrogen receptor positive ([place])     12/02/2016 Genetic Testing    Negative genetic testing on the common hereditary cancer panel.  The Hereditary Gene Panel offered by [facility] includes sequencing and/or deletion duplication testing of the following 46 genes: APC, ATM, AXIN2, BARD1, BMPR1A, BRCA1, BRCA2, BRIP1, CDH1, CDKN2A (p14ARF), CDKN2A (p16INK4a), CHEK2, CTNNA1, DICER1, EPCAM (Deletion/duplication testing only), GREM1 (promoter region deletion/duplication testing only), KIT, MEN1, MLH1, MSH2, MSH3, MSH6, MUTYH, NBN, NF1, NHTL1, PALB2, PDGFRA, PMS2, POLD1, POLE, PTEN, RAD50, RAD51C, RAD51D, SDHB, SDHC, SDHD, SMAD4, SMARCA4. STK11, TP53, TSC1, TSC2, and VHL.  The following genes were evaluated for sequence changes only: SDHA and HOXB13 c.251G>A variant only.  The report date is December 02, 2016.        FAMILY HISTORY:  We obtained a detailed, 4-generation family history.  Significant diagnoses are listed below: Family History  Problem Relation Age of Onset  . COPD Father   . Breast cancer Other        dx in her 69s-30s    The patient has one son and two grandsons who are all cancer free.  She is an only child.  Both  parents are deceased.  Her mother died at 63 from atherosclerosis.  Her mother had two full sisters and a paternal half sister who are all cancer free.  Her full sisters are deceased.  Her grandmother died of TB in her 45's-30's.  Her grandmother's sister had breast cancer and died in her 17's-30's.  The patient's father died at 81 from COPD and a heart attack.  He had two full brothers, a full sister and several maternal half siblings.  Nobody had cancer to the patient's knowledge.  Ms. Gamboa is unaware of previous family history of genetic testing for hereditary cancer risks. Patient's ancestors are of [place], [place] and [place] descent. There is no reported Ashkenazi Jewish ancestry. There is no known consanguinity.  GENETIC TEST RESULTS: Genetic testing reported out on December 02, 2016 through the Common Hereditary cancer panel found no deleterious mutations.  The Hereditary Gene Panel offered by [facility] includes sequencing and/or deletion duplication testing of the following 46 genes: APC, ATM, AXIN2, BARD1, BMPR1A, BRCA1, BRCA2, BRIP1, CDH1, CDKN2A (p14ARF), CDKN2A (p16INK4a), CHEK2, CTNNA1, DICER1, EPCAM (Deletion/duplication testing only), GREM1 (promoter region deletion/duplication testing only), KIT, MEN1, MLH1, MSH2, MSH3, MSH6, MUTYH, NBN, NF1, NHTL1, PALB2, PDGFRA, PMS2, POLD1, POLE, PTEN, RAD50, RAD51C, RAD51D, SDHB, SDHC, SDHD, SMAD4, SMARCA4. STK11, TP53, TSC1, TSC2, and VHL.  The following genes were evaluated for sequence changes only: SDHA and HOXB13 c.251G>A variant only.  The test report has been scanned into EPIC and is located under the Molecular Pathology section of the Results Review tab.   We discussed with Ms. Fambrough that since the current genetic testing is not  perfect, it is possible there may be a gene mutation in one of these genes that current testing cannot detect, but that chance is small. We also discussed, that it is possible that another gene that has  not yet been discovered, or that we have not yet tested, is responsible for the cancer diagnoses in the family, and it is, therefore, important to remain in touch with cancer genetics in the future so that we can continue to offer Ms. Matsuoka the most up to date genetic testing.     CANCER SCREENING RECOMMENDATIONS:This result is reassuring and indicates that Ms. Montijo likely does not have an increased risk for a future cancer due to a mutation in one of these genes. This normal test also suggests that Ms. Keesling's cancer was most likely not due to an inherited predisposition associated with one of these genes.  Most cancers happen by chance and this negative test suggests that her cancer falls into this category.  We, therefore, recommended she continue to follow the cancer management and screening guidelines provided by her oncology and primary healthcare provider.   RECOMMENDATIONS FOR FAMILY MEMBERS: Women in this family might be at some increased risk of developing cancer, over the general population risk, simply due to the family history of cancer. We recommended women in this family have a yearly mammogram beginning at age 17, or 3 years younger than the earliest onset of cancer, an annual clinical breast exam, and perform monthly breast self-exams. Women in this family should also have a gynecological exam as recommended by their primary provider. All family members should have a colonoscopy by age 82.  FOLLOW-UP: Lastly, we discussed with Ms. Pancake that cancer genetics is a rapidly advancing field and it is possible that new genetic tests will be appropriate for her and/or her family members in the future. We encouraged her to remain in contact with cancer genetics on an annual basis so we can update her personal and family histories and let her know of advances in cancer genetics that may benefit this family.   Our contact number was provided. Ms. Pearce questions were answered to her  satisfaction, and she knows she is welcome to call us at anytime with additional questions or concerns.   Roma Kayser, MS, [facility] Certified Genetic Counselor [email or web address]

## 2016-12-24 NOTE — Telephone Encounter (Signed)
Revealed negative genetic testing.  Discussed that we do not know why she has breast cancer or why there is cancer in the family. It could be due to a different gene that we are not testing, or maybe our current technology may not be able to pick something up.  It will be important for her to keep in contact with genetics to keep up with whether additional testing may be needed. 

## 2016-12-28 ENCOUNTER — Telehealth: Payer: Self-pay | Admitting: *Deleted

## 2016-12-28 NOTE — Telephone Encounter (Signed)
Received call from patient stating her left foot was swollen and red last week which she often gets arthritis and she was wearing hard flip flops and on her feet a lot last week.  She elevated it and seemed to get better.  But now she states her right foot is doing the same with her toes red and inflamed as well.  She states this stops at her ankles. I instructed her to go to urgent care or PCP whoever she could get in with today to get this evaluated. She is scheduled for a colonoscopy tomorrow and is rather reluctant to go see a provider but I strongly encouraged her to do so.  She states she will talk to her husband and get him to take her.

## 2017-01-03 ENCOUNTER — Telehealth: Payer: Self-pay | Admitting: Oncology

## 2017-01-03 NOTE — Telephone Encounter (Signed)
sw pt to confirm r/s Aug appt to 10/8 at 0900 per sch msg

## 2017-01-07 ENCOUNTER — Ambulatory Visit: Payer: Self-pay | Admitting: Podiatry

## 2017-01-10 ENCOUNTER — Inpatient Hospital Stay: Admit: 2017-01-10 | Payer: Medicare Other | Admitting: Orthopedic Surgery

## 2017-01-10 SURGERY — ARTHROPLASTY, KNEE, TOTAL
Anesthesia: Choice | Site: Knee | Laterality: Left

## 2017-01-13 ENCOUNTER — Ambulatory Visit: Payer: Medicare Other | Admitting: Oncology

## 2017-01-15 NOTE — Pre-Procedure Instructions (Addendum)
[name]  01/15/2017      [id number] - [place], [place] [address] [place] [address] Phone: [phone] Fax: [phone]    Your procedure is scheduled on January 28, 2017.  Report to [facility] Admitting at 10:00 A.M.  Call this number if you have problems the morning of surgery:  [phone]   Call [phone] if you have any questions prior to your surgery date Monday-Friday 8am-4pm   Remember:  Do not eat food or drink liquids after midnight.   Take these medicines the morning of surgery with A SIP OF WATER Propranolol ER (Inderal LA), Albuterol inhaler-bring with you, Anastrozole (Arimidex), Breo Ellipta inhaler-bring with you, Restasis eye drops, Tramadol (Ultram) if needed  Please complete your 8oz of Boost Breeze that was given to you at your preadmission appointment by 8:00 AM on the day of your surgery.  STOP Xarelto as directed by your Doctor  7 days prior to surgery STOP taking any Aspirin, Aleve, Naproxen, Ibuprofen, Motrin, Advil, Goody's, BC's, all herbal medications, fish oil, and all vitamins.   Do not wear jewelry, make-up or nail polish.  Do not wear lotions, powders, or perfumes, or deoderant.  Do not shave 48 hours prior to surgery.    Do not bring valuables to the hospital.   [facility] is not responsible for any belongings or valuables.  Contacts, dentures or bridgework may not be worn into surgery.  Leave your suitcase in the car.  After surgery it may be brought to your room.  For patients admitted to the hospital, discharge time will be determined by your treatment team.  Patients discharged the day of surgery will not be allowed to drive home.    Special instructions:   [facility]- Preparing For Surgery  Before surgery, you can play an important role. Because skin is not sterile, your skin needs to be as free of germs as possible. You can reduce the number of germs on your skin by  washing with CHG (chlorahexidine gluconate) Soap before surgery.  CHG is an antiseptic cleaner which kills germs and bonds with the skin to continue killing germs even after washing.  Please do not use if you have an allergy to CHG or antibacterial soaps. If your skin becomes reddened/irritated stop using the CHG.  Do not shave (including legs and underarms) for at least 48 hours prior to first CHG shower. It is OK to shave your face.  Please follow these instructions carefully.   1. Shower the NIGHT BEFORE SURGERY and the MORNING OF SURGERY with CHG.   2. If you chose to wash your hair, wash your hair first as usual with your normal shampoo.  3. After you shampoo, rinse your hair and body thoroughly to remove the shampoo.  4. Use CHG as you would any other liquid soap. You can apply CHG directly to the skin and wash gently with a scrungie or a clean washcloth.   5. Apply the CHG Soap to your body ONLY FROM THE NECK DOWN.  Do not use on open wounds or open sores. Avoid contact with your eyes, ears, mouth and genitals (private parts). Wash genitals (private parts) with your normal soap.  6. Wash thoroughly, paying special attention to the area where your surgery will be performed.  7. Thoroughly rinse your body with warm water from the neck down.  8. DO NOT shower/wash with your normal soap after using and rinsing off the  CHG Soap.  9. Pat yourself dry with a CLEAN TOWEL.   10. Wear CLEAN PAJAMAS   11. Place CLEAN SHEETS on your bed the night of your first shower and DO NOT SLEEP WITH PETS.    Day of Surgery: Do not apply any deodorants/lotions. Please wear clean clothes to the hospital/surgery center.      Please read over the following fact sheets that you were given. Pain Booklet, Coughing and Deep Breathing and Surgical Site Infection Prevention

## 2017-01-17 ENCOUNTER — Encounter (HOSPITAL_COMMUNITY)
Admission: RE | Admit: 2017-01-17 | Discharge: 2017-01-17 | Disposition: A | Discharge status: Home / Self Care | Payer: Medicare Other | Source: Ambulatory Visit | Attending: General Surgery | Admitting: General Surgery

## 2017-01-17 ENCOUNTER — Encounter (HOSPITAL_COMMUNITY): Payer: Self-pay

## 2017-01-17 DIAGNOSIS — Z01812 Encounter for preprocedural laboratory examination: Secondary | ICD-10-CM | POA: Diagnosis present

## 2017-01-17 DIAGNOSIS — C50911 Malignant neoplasm of unspecified site of right female breast: Secondary | ICD-10-CM | POA: Insufficient documentation

## 2017-01-17 HISTORY — DX: Concussion with loss of consciousness status unknown, initial encounter: S06.0XAA

## 2017-01-17 HISTORY — DX: Unspecified dislocation of unspecified ulnohumeral joint, initial encounter: S53.106A

## 2017-01-17 HISTORY — DX: Concussion with loss of consciousness of unspecified duration, initial encounter: S06.0X9A

## 2017-01-17 HISTORY — DX: Cardiac arrhythmia, unspecified: I49.9

## 2017-01-17 HISTORY — DX: Anemia, unspecified: D64.9

## 2017-01-17 LAB — CBC
HCT: 40.5 % (ref 36.0–46.0)
Hemoglobin: 13.9 g/dL (ref 12.0–15.0)
MCH: 32 pg (ref 26.0–34.0)
MCHC: 34.3 g/dL (ref 30.0–36.0)
MCV: 93.1 fL (ref 78.0–100.0)
PLATELETS: 284 10*3/uL (ref 150–400)
RBC: 4.35 MIL/uL (ref 3.87–5.11)
RDW: 12.6 % (ref 11.5–15.5)
WBC: 10.3 10*3/uL (ref 4.0–10.5)

## 2017-01-17 LAB — BASIC METABOLIC PANEL
Anion gap: 10 (ref 5–15)
BUN: 9 mg/dL (ref 6–20)
CHLORIDE: 94 mmol/L — AB (ref 101–111)
CO2: 28 mmol/L (ref 22–32)
CREATININE: 0.64 mg/dL (ref 0.44–1.00)
Calcium: 9.5 mg/dL (ref 8.9–10.3)
GFR calc non Af Amer: >60 mL/min (ref >60)
Glucose, Bld: 93 mg/dL (ref 65–99)
Potassium: 3.4 mmol/L — ABNORMAL LOW (ref 3.5–5.1)
Sodium: 132 mmol/L — ABNORMAL LOW (ref 135–145)

## 2017-01-17 NOTE — Progress Notes (Signed)
PCP: Dr. [name] @ [facility]  Cardiology :Dr. Ritta Slot ekg/notes/echo  Pt. Allergic to CHG, pt. Reports she cannot use dial soap. Pt. Uses Ivory soap, instructed to shower with it night before and morning of surgery.  Pt. Instructed to stop xarelto 3 days prior to surgery per Dr. [name].

## 2017-01-17 NOTE — H&P (Signed)
  Subjective:     Patient ID: [name] is a 76 y.o. female.  HPI  Here for follow up discussion breat reconstruction prior to planned mastectomy. Reports palpable mass of right breast for over a year per patient was counseled fibrocystic. Presented following annual diagnostic MMG with a 3.5 cm spiculated mass in the right breast UIQ. [place] confirmed a 3.5 cm solid mass in the right UIQ, with no abnormalities noted in the right axilla. 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 MRI demonstrated known 2.6 cm mass UIQ. Additional right UOQ 0.5 x 0.7 x 0.3 cm mass noted. 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 Given multicentric disease, mastectomy recommended.  Oncotype will be sent from the definitive surgical specimen. Started neoadjuvantly on arimidex.  Genetics negative.  Wt stable.  Accompanied by husband.  Seen in [facility] ED on 7.10.18, sent by her PCP at time of pre op evaluation, for new diagnosis atrial fibrillation. On Xarelto, managed by Dr. [name]. Since last visit had ECHO; no results available today, per patient her "heart is strong." States Dr. [name] recommend hold Xarelto 2 days prior to surgery.   Scheduled for left TKA on 8.6.18- this has been postponed. Takes doxycyline prior to dental work per recommendation of her Orthopaedic surgeron given prior TKA  Review of Systems    Objective:   Physical Exam  Constitutional: She is oriented to person, place, and time.  Cardiovascular: Normal heart sounds.   +irregular rhythm  Pulmonary/Chest: Effort normal.  Abdominal: Soft.  Neurological: She is alert and oriented to person, place, and time.   Nipples 1.5 cm below IMF, at lower border breast mound Right breast mass at 1 o clock, resolving ecchymoses breast with resolution previously noted hematoma resolved SN to nipple R26 L 26 cm BW R 17 L 17 cm (CW 13 Cm) Nipple to IMF R 5 L 5 cm    Assessment:     R breast ca multicentric New onset a fib- on  anticoagulation  Plan:      Plan immediate TE placement at time of mastectomy skin sparing.   Reviewed incision, drains, OR length, hospital stay and recovery/post operative limitations. Discussed process of expansion and implant based risks including rupture, MRI surveillance for silicone implants, infection requiring surgery or removal, contracture. Discussed asymmetry one can expect with implant unilateral and natural breast on opposite.   Reviewed any type mastectomy flap risks necrosis requiring additional surgery. Reviewed all discussed reconstruction will have no sensation.  Reviewed use of ADM in reconstruction, cadaveric source. Reviewed this will incorporate over time however if problems with seroma, infection early on can act as another nidus for infection and may require removal.  Additional risks including but not limited to bleeding, hematoma, seroma, damage to deeper structures, extrusion, DVT/PE, cardiopulmonary complications, wound healing problems reviewed.  Reviewed her anticoagulation puts her at higher risk of perioperative bleeding complications, even if held for 2 days prior. Plan Lovenox for at least week postoperative in case of any complications. Instructed patient this would require her to self administer medication/injections. Message sent to Dr. [name] regarding this.  Has allergy to codeine but tolerated oxycodone.  [name], MD [facility] Plastic & Reconstructive Surgery [phone], pin [phone]

## 2017-01-18 NOTE — Progress Notes (Addendum)
Anesthesia Chart Review:  Pt is a 76 year old female scheduled for R total mastectomy with sentinel lymph node biopsy, R breast reconstruction with placement of tissue expander and alloderm on 01/28/2017 with Excell Seltzer, MD and [name], MD  - PCP is [name], MD - Cardiologist is Tollie Eth, MD who has cleared pt for surgery.   PMH includes:  Atrial fibrillation, HTN, asthma, breast cancer, melanoma. Never smoker. BMI 27.5.  Medications include: Albuterol, Arimidex, breo ellipta, potassium, propranolol, Xarelto, Maxzide  Preoperative labs reviewed. PT will be obtained DOS  CXR 12/15/16: No active cardiopulmonary disease.  EKG 12/14/16: Atrial fibrillation (78 bpm). Nonspecific ST abnormality  Nuclear stress test 01/10/13 (correspondence dated 01/24/13 in media tab): 1. Post stress myocardial perfusion images show a normal pattern of perfusion in all regions. The post stress LV is normal in size. 2. Post stress LV function is normal. EF 74%. 3. Unremarkable pharmacological stress test. 4. Normal myocardial perfusion study. This was essentially a low risk scan.  Echo July 2018 (Notes from Dr. Thurman Coyer office indicate pt had echo): - Mild concentric LVH and mild enlargement of LA. EF is around 50%. - Dr. [name] recommended she have her breast surgery and then resume anticoagulation after she has breast surgery. F/u in 6 weeks  If no changes, I anticipate pt can proceed with surgery as scheduled.   Willeen Cass, FNP-BC [facility]/Anesthesiology Phone: [phone] 01/20/2017 5:14 PM

## 2017-01-20 ENCOUNTER — Telehealth: Payer: Self-pay | Admitting: *Deleted

## 2017-01-20 NOTE — Telephone Encounter (Signed)
Received call from patient stating she has been having pain and swelling in her feet.  She has seen her PCP and orthopedic and was evaluated. She thought it may have been gout but this was not the case.  She is currently on anastrozole and this began after she started the anastrozole. Instructed her to stop anastrozole for a few weeks to see if symptoms get better.  She will call back and let us know.

## 2017-01-21 ENCOUNTER — Ambulatory Visit: Payer: Medicare Other | Admitting: Oncology

## 2017-01-28 ENCOUNTER — Ambulatory Visit (HOSPITAL_COMMUNITY): Payer: Medicare Other | Admitting: Anesthesiology

## 2017-01-28 ENCOUNTER — Ambulatory Visit (HOSPITAL_COMMUNITY): Payer: Medicare Other | Admitting: Emergency Medicine

## 2017-01-28 ENCOUNTER — Observation Stay (HOSPITAL_COMMUNITY)
Admission: RE | Admit: 2017-01-28 | Discharge: 2017-01-29 | Disposition: A | Discharge status: Home / Self Care | Payer: Medicare Other | Source: Ambulatory Visit | Attending: Plastic Surgery | Admitting: Plastic Surgery

## 2017-01-28 ENCOUNTER — Encounter (HOSPITAL_COMMUNITY)
Admission: RE | Disposition: A | Discharge status: Home / Self Care | Payer: Self-pay | Source: Ambulatory Visit | Attending: Plastic Surgery

## 2017-01-28 ENCOUNTER — Encounter (HOSPITAL_COMMUNITY): Payer: Self-pay | Admitting: Urology

## 2017-01-28 ENCOUNTER — Encounter (HOSPITAL_COMMUNITY)
Admission: RE | Admit: 2017-01-28 | Discharge: 2017-01-28 | Disposition: A | Discharge status: Home / Self Care | Payer: Medicare Other | Source: Ambulatory Visit | Attending: General Surgery | Admitting: General Surgery

## 2017-01-28 DIAGNOSIS — Z170 Estrogen receptor positive status [ER+]: Secondary | ICD-10-CM | POA: Diagnosis not present

## 2017-01-28 DIAGNOSIS — Z9889 Other specified postprocedural states: Secondary | ICD-10-CM | POA: Insufficient documentation

## 2017-01-28 DIAGNOSIS — Z780 Asymptomatic menopausal state: Secondary | ICD-10-CM | POA: Diagnosis not present

## 2017-01-28 DIAGNOSIS — Z9842 Cataract extraction status, left eye: Secondary | ICD-10-CM | POA: Diagnosis not present

## 2017-01-28 DIAGNOSIS — Z79890 Hormone replacement therapy (postmenopausal): Secondary | ICD-10-CM | POA: Diagnosis not present

## 2017-01-28 DIAGNOSIS — Z86010 Personal history of colonic polyps: Secondary | ICD-10-CM | POA: Diagnosis not present

## 2017-01-28 DIAGNOSIS — Z832 Family history of diseases of the blood and blood-forming organs and certain disorders involving the immune mechanism: Secondary | ICD-10-CM | POA: Diagnosis not present

## 2017-01-28 DIAGNOSIS — Z7982 Long term (current) use of aspirin: Secondary | ICD-10-CM | POA: Diagnosis not present

## 2017-01-28 DIAGNOSIS — J45909 Unspecified asthma, uncomplicated: Secondary | ICD-10-CM | POA: Diagnosis not present

## 2017-01-28 DIAGNOSIS — Z7901 Long term (current) use of anticoagulants: Secondary | ICD-10-CM | POA: Diagnosis not present

## 2017-01-28 DIAGNOSIS — Z803 Family history of malignant neoplasm of breast: Secondary | ICD-10-CM | POA: Insufficient documentation

## 2017-01-28 DIAGNOSIS — Z8249 Family history of ischemic heart disease and other diseases of the circulatory system: Secondary | ICD-10-CM | POA: Insufficient documentation

## 2017-01-28 DIAGNOSIS — Z79899 Other long term (current) drug therapy: Secondary | ICD-10-CM | POA: Insufficient documentation

## 2017-01-28 DIAGNOSIS — D0501 Lobular carcinoma in situ of right breast: Principal | ICD-10-CM | POA: Insufficient documentation

## 2017-01-28 DIAGNOSIS — Z888 Allergy status to other drugs, medicaments and biological substances status: Secondary | ICD-10-CM | POA: Diagnosis not present

## 2017-01-28 DIAGNOSIS — Z9841 Cataract extraction status, right eye: Secondary | ICD-10-CM | POA: Insufficient documentation

## 2017-01-28 DIAGNOSIS — F419 Anxiety disorder, unspecified: Secondary | ICD-10-CM | POA: Diagnosis not present

## 2017-01-28 DIAGNOSIS — Z811 Family history of alcohol abuse and dependence: Secondary | ICD-10-CM | POA: Diagnosis not present

## 2017-01-28 DIAGNOSIS — Z885 Allergy status to narcotic agent status: Secondary | ICD-10-CM | POA: Insufficient documentation

## 2017-01-28 DIAGNOSIS — C50811 Malignant neoplasm of overlapping sites of right female breast: Secondary | ICD-10-CM

## 2017-01-28 DIAGNOSIS — I10 Essential (primary) hypertension: Secondary | ICD-10-CM | POA: Diagnosis not present

## 2017-01-28 DIAGNOSIS — Z836 Family history of other diseases of the respiratory system: Secondary | ICD-10-CM | POA: Diagnosis not present

## 2017-01-28 DIAGNOSIS — M1990 Unspecified osteoarthritis, unspecified site: Secondary | ICD-10-CM | POA: Insufficient documentation

## 2017-01-28 DIAGNOSIS — I4891 Unspecified atrial fibrillation: Secondary | ICD-10-CM | POA: Diagnosis not present

## 2017-01-28 DIAGNOSIS — C50211 Malignant neoplasm of upper-inner quadrant of right female breast: Secondary | ICD-10-CM | POA: Diagnosis present

## 2017-01-28 HISTORY — PX: BREAST RECONSTRUCTION WITH PLACEMENT OF TISSUE EXPANDER AND FLEX HD (ACELLULAR HYDRATED DERMIS): SHX6295

## 2017-01-28 HISTORY — PX: MASTECTOMY W/ SENTINEL NODE BIOPSY: SHX2001

## 2017-01-28 LAB — PROTIME-INR
INR: 1.07
PROTHROMBIN TIME: 13.9 s (ref 11.4–15.2)

## 2017-01-28 SURGERY — MASTECTOMY WITH SENTINEL LYMPH NODE BIOPSY
Anesthesia: General | Site: Breast | Laterality: Right

## 2017-01-28 MED ORDER — CHLORHEXIDINE GLUCONATE CLOTH 2 % EX PADS: 6.0000 | MEDICATED_PAD | Freq: Once | CUTANEOUS | Status: DC

## 2017-01-28 MED ORDER — PROPRANOLOL HCL ER 80 MG PO CP24
80.0000 mg | ORAL_CAPSULE | Freq: Every day | ORAL | Status: DC
  Filled 2017-01-28: qty 1

## 2017-01-28 MED ORDER — 0.9 % SODIUM CHLORIDE (POUR BTL) OPTIME
TOPICAL | Status: DC | PRN
  Administered 2017-01-28: 1000 mL

## 2017-01-28 MED ORDER — CYCLOSPORINE 0.05 % OP EMUL
1.0000 [drp] | Freq: Every day | OPHTHALMIC | Status: DC
  Filled 2017-01-28: qty 1

## 2017-01-28 MED ORDER — TECHNETIUM TC 99M SULFUR COLLOID FILTERED
1.0000 | Freq: Once | INTRAVENOUS | Status: AC | PRN
  Administered 2017-01-28: 1 via INTRADERMAL

## 2017-01-28 MED ORDER — ACETAMINOPHEN 500 MG PO TABS
1000.0000 mg | ORAL_TABLET | ORAL | Status: AC
  Administered 2017-01-28: 1000 mg via ORAL

## 2017-01-28 MED ORDER — SODIUM CHLORIDE 0.9 % IJ SOLN
INTRAMUSCULAR | Status: AC
  Filled 2017-01-28: qty 10

## 2017-01-28 MED ORDER — CEFAZOLIN SODIUM-DEXTROSE 2-4 GM/100ML-% IV SOLN
INTRAVENOUS | Status: AC
Start: 2017-01-28 — End: 2017-01-28
  Filled 2017-01-28: qty 100

## 2017-01-28 MED ORDER — BUPIVACAINE-EPINEPHRINE (PF) 0.5% -1:200000 IJ SOLN
INTRAMUSCULAR | Status: DC | PRN
  Administered 2017-01-28: 30 mL via PERINEURAL

## 2017-01-28 MED ORDER — LORATADINE 10 MG PO TABS
10.0000 mg | ORAL_TABLET | Freq: Every day | ORAL | Status: DC
  Filled 2017-01-28 (×2): qty 1

## 2017-01-28 MED ORDER — FENTANYL CITRATE (PF) 100 MCG/2ML IJ SOLN
25.0000 ug | INTRAMUSCULAR | Status: DC | PRN
  Administered 2017-01-28 (×2): 50 ug via INTRAVENOUS

## 2017-01-28 MED ORDER — ENOXAPARIN SODIUM 40 MG/0.4ML ~~LOC~~ SOLN
40.0000 mg | SUBCUTANEOUS | Status: DC
  Administered 2017-01-29: 40 mg via SUBCUTANEOUS
  Filled 2017-01-28 (×2): qty 0.4

## 2017-01-28 MED ORDER — ONDANSETRON HCL 4 MG/2ML IJ SOLN: 4.0000 mg | Freq: Once | INTRAMUSCULAR | Status: DC | PRN

## 2017-01-28 MED ORDER — KCL IN DEXTROSE-NACL 20-5-0.45 MEQ/L-%-% IV SOLN
INTRAVENOUS | Status: DC
  Administered 2017-01-28 – 2017-01-29 (×2): via INTRAVENOUS
  Filled 2017-01-28 (×2): qty 1000

## 2017-01-28 MED ORDER — GABAPENTIN 300 MG PO CAPS
ORAL_CAPSULE | ORAL | Status: AC
  Administered 2017-01-28: 300 mg via ORAL
  Filled 2017-01-28: qty 1

## 2017-01-28 MED ORDER — MIDAZOLAM HCL 2 MG/2ML IJ SOLN
INTRAMUSCULAR | Status: AC
  Administered 2017-01-28: 1 mg via INTRAVENOUS
  Filled 2017-01-28: qty 2

## 2017-01-28 MED ORDER — METHYLENE BLUE 0.5 % INJ SOLN
INTRAVENOUS | Status: AC
  Filled 2017-01-28: qty 10

## 2017-01-28 MED ORDER — TRIAMTERENE-HCTZ 37.5-25 MG PO TABS
1.0000 | ORAL_TABLET | Freq: Every morning | ORAL | Status: DC
  Filled 2017-01-28: qty 1

## 2017-01-28 MED ORDER — SUCCINYLCHOLINE CHLORIDE 200 MG/10ML IV SOSY
PREFILLED_SYRINGE | INTRAVENOUS | Status: AC
  Filled 2017-01-28: qty 10

## 2017-01-28 MED ORDER — FENTANYL CITRATE (PF) 100 MCG/2ML IJ SOLN: 25.0000 ug | INTRAMUSCULAR | Status: DC | PRN

## 2017-01-28 MED ORDER — GABAPENTIN 300 MG PO CAPS
300.0000 mg | ORAL_CAPSULE | ORAL | Status: AC
  Administered 2017-01-28: 300 mg via ORAL

## 2017-01-28 MED ORDER — ALBUTEROL SULFATE (2.5 MG/3ML) 0.083% IN NEBU: 3.0000 mL | INHALATION_SOLUTION | Freq: Four times a day (QID) | RESPIRATORY_TRACT | Status: DC | PRN

## 2017-01-28 MED ORDER — FENTANYL CITRATE (PF) 100 MCG/2ML IJ SOLN
INTRAMUSCULAR | Status: AC
  Administered 2017-01-28: 50 ug via INTRAVENOUS
  Filled 2017-01-28: qty 2

## 2017-01-28 MED ORDER — FENTANYL CITRATE (PF) 250 MCG/5ML IJ SOLN
INTRAMUSCULAR | Status: AC
  Filled 2017-01-28: qty 5

## 2017-01-28 MED ORDER — TRAMADOL HCL 50 MG PO TABS
50.0000 mg | ORAL_TABLET | Freq: Four times a day (QID) | ORAL | Status: DC | PRN
  Administered 2017-01-28 – 2017-01-29 (×2): 50 mg via ORAL
  Filled 2017-01-28 (×2): qty 1

## 2017-01-28 MED ORDER — METHOCARBAMOL 500 MG PO TABS
ORAL_TABLET | ORAL | Status: AC
  Filled 2017-01-28: qty 1

## 2017-01-28 MED ORDER — PHENYLEPHRINE 40 MCG/ML (10ML) SYRINGE FOR IV PUSH (FOR BLOOD PRESSURE SUPPORT)
PREFILLED_SYRINGE | INTRAVENOUS | Status: AC
  Filled 2017-01-28: qty 40

## 2017-01-28 MED ORDER — MIDAZOLAM HCL 2 MG/2ML IJ SOLN
1.0000 mg | Freq: Once | INTRAMUSCULAR | Status: AC
  Administered 2017-01-28: 1 mg via INTRAVENOUS
  Filled 2017-01-28: qty 1

## 2017-01-28 MED ORDER — OXYCODONE HCL 5 MG PO TABS
ORAL_TABLET | ORAL | Status: AC
  Filled 2017-01-28: qty 1

## 2017-01-28 MED ORDER — METHOCARBAMOL 500 MG PO TABS
500.0000 mg | ORAL_TABLET | Freq: Three times a day (TID) | ORAL | Status: DC | PRN
  Administered 2017-01-28 – 2017-01-29 (×3): 500 mg via ORAL
  Filled 2017-01-28 (×2): qty 1

## 2017-01-28 MED ORDER — LIDOCAINE HCL (CARDIAC) 20 MG/ML IV SOLN
INTRAVENOUS | Status: DC | PRN
  Administered 2017-01-28: 60 mg via INTRAVENOUS

## 2017-01-28 MED ORDER — CEFAZOLIN SODIUM-DEXTROSE 1-4 GM/50ML-% IV SOLN
1.0000 g | Freq: Three times a day (TID) | INTRAVENOUS | Status: DC
  Administered 2017-01-28 – 2017-01-29 (×2): 1 g via INTRAVENOUS
  Filled 2017-01-28 (×4): qty 50

## 2017-01-28 MED ORDER — FLUTICASONE FUROATE-VILANTEROL 100-25 MCG/INH IN AEPB
1.0000 | INHALATION_SPRAY | Freq: Every day | RESPIRATORY_TRACT | Status: DC
  Administered 2017-01-29: 1 via RESPIRATORY_TRACT
  Filled 2017-01-28: qty 28

## 2017-01-28 MED ORDER — POTASSIUM 99 MG PO TABS: 99.0000 mg | ORAL_TABLET | Freq: Every day | ORAL | Status: DC

## 2017-01-28 MED ORDER — LIDOCAINE 2% (20 MG/ML) 5 ML SYRINGE
INTRAMUSCULAR | Status: AC
  Filled 2017-01-28: qty 10

## 2017-01-28 MED ORDER — PROPOFOL 10 MG/ML IV BOLUS
INTRAVENOUS | Status: DC | PRN
  Administered 2017-01-28: 150 mg via INTRAVENOUS

## 2017-01-28 MED ORDER — EPHEDRINE SULFATE 50 MG/ML IJ SOLN
INTRAMUSCULAR | Status: DC | PRN
  Administered 2017-01-28: 5 mg via INTRAVENOUS

## 2017-01-28 MED ORDER — FENTANYL CITRATE (PF) 100 MCG/2ML IJ SOLN
INTRAMUSCULAR | Status: AC
  Filled 2017-01-28: qty 2

## 2017-01-28 MED ORDER — MIDAZOLAM HCL 5 MG/5ML IJ SOLN
INTRAMUSCULAR | Status: DC | PRN
Start: 2017-01-28 — End: 2017-01-28
  Administered 2017-01-28: 2 mg via INTRAVENOUS

## 2017-01-28 MED ORDER — CEFAZOLIN SODIUM-DEXTROSE 2-4 GM/100ML-% IV SOLN
2.0000 g | INTRAVENOUS | Status: AC
  Administered 2017-01-28: 2 g via INTRAVENOUS

## 2017-01-28 MED ORDER — ROCURONIUM BROMIDE 100 MG/10ML IV SOLN
INTRAVENOUS | Status: DC | PRN
  Administered 2017-01-28: 50 mg via INTRAVENOUS
  Administered 2017-01-28: 10 mg via INTRAVENOUS

## 2017-01-28 MED ORDER — DEXAMETHASONE SODIUM PHOSPHATE 4 MG/ML IJ SOLN
INTRAMUSCULAR | Status: DC | PRN
  Administered 2017-01-28: 10 mg via INTRAVENOUS

## 2017-01-28 MED ORDER — PHENYLEPHRINE 40 MCG/ML (10ML) SYRINGE FOR IV PUSH (FOR BLOOD PRESSURE SUPPORT)
PREFILLED_SYRINGE | INTRAVENOUS | Status: DC | PRN
  Administered 2017-01-28 (×8): 80 ug via INTRAVENOUS

## 2017-01-28 MED ORDER — LEVOCETIRIZINE DIHYDROCHLORIDE 5 MG PO TABS: 5.0000 mg | ORAL_TABLET | Freq: Every day | ORAL | Status: DC

## 2017-01-28 MED ORDER — ONDANSETRON HCL 4 MG/2ML IJ SOLN
INTRAMUSCULAR | Status: AC
  Filled 2017-01-28: qty 4

## 2017-01-28 MED ORDER — LACTATED RINGERS IV SOLN
INTRAVENOUS | Status: DC
  Administered 2017-01-28 (×3): via INTRAVENOUS

## 2017-01-28 MED ORDER — OXYCODONE HCL 5 MG PO TABS
5.0000 mg | ORAL_TABLET | ORAL | Status: DC | PRN
  Administered 2017-01-28 – 2017-01-29 (×2): 5 mg via ORAL
  Filled 2017-01-28 (×2): qty 1

## 2017-01-28 MED ORDER — EPHEDRINE 5 MG/ML INJ
INTRAVENOUS | Status: AC
  Filled 2017-01-28: qty 10

## 2017-01-28 MED ORDER — ALPRAZOLAM 0.5 MG PO TABS: 0.5000 mg | ORAL_TABLET | Freq: Every day | ORAL | Status: DC | PRN

## 2017-01-28 MED ORDER — FENTANYL CITRATE (PF) 100 MCG/2ML IJ SOLN
50.0000 ug | Freq: Once | INTRAMUSCULAR | Status: AC
  Administered 2017-01-28: 50 ug via INTRAVENOUS
  Filled 2017-01-28: qty 1

## 2017-01-28 MED ORDER — HYDROMORPHONE HCL 1 MG/ML IJ SOLN
0.5000 mg | INTRAMUSCULAR | Status: DC | PRN
  Administered 2017-01-28 – 2017-01-29 (×2): 0.5 mg via INTRAVENOUS
  Filled 2017-01-28 (×2): qty 1

## 2017-01-28 MED ORDER — ONDANSETRON HCL 4 MG/2ML IJ SOLN
INTRAMUSCULAR | Status: DC | PRN
  Administered 2017-01-28: 4 mg via INTRAVENOUS

## 2017-01-28 MED ORDER — OXYCODONE HCL 5 MG PO TABS
5.0000 mg | ORAL_TABLET | Freq: Once | ORAL | Status: AC | PRN
  Administered 2017-01-28: 5 mg via ORAL

## 2017-01-28 MED ORDER — KETOROLAC TROMETHAMINE 15 MG/ML IJ SOLN
15.0000 mg | Freq: Three times a day (TID) | INTRAMUSCULAR | Status: AC
  Administered 2017-01-28 – 2017-01-29 (×3): 15 mg via INTRAVENOUS
  Filled 2017-01-28 (×3): qty 1

## 2017-01-28 MED ORDER — MIDAZOLAM HCL 2 MG/2ML IJ SOLN
INTRAMUSCULAR | Status: AC
  Filled 2017-01-28: qty 2

## 2017-01-28 MED ORDER — OXYCODONE HCL 5 MG/5ML PO SOLN: 5.0000 mg | Freq: Once | ORAL | Status: AC | PRN

## 2017-01-28 MED ORDER — DEXTROSE 5 % IV SOLN
INTRAVENOUS | Status: DC | PRN
  Administered 2017-01-28: 20 ug/min via INTRAVENOUS

## 2017-01-28 MED ORDER — ONDANSETRON HCL 4 MG/2ML IJ SOLN: 4.0000 mg | Freq: Four times a day (QID) | INTRAMUSCULAR | Status: DC | PRN

## 2017-01-28 MED ORDER — SUGAMMADEX SODIUM 200 MG/2ML IV SOLN
INTRAVENOUS | Status: DC | PRN
  Administered 2017-01-28: 200 mg via INTRAVENOUS

## 2017-01-28 MED ORDER — SODIUM CHLORIDE 0.9 % IV SOLN
Freq: Once | INTRAVENOUS | Status: AC
  Administered 2017-01-28: 1000 mL
  Filled 2017-01-28: qty 1

## 2017-01-28 MED ORDER — ACETAMINOPHEN 500 MG PO TABS
ORAL_TABLET | ORAL | Status: AC
  Administered 2017-01-28: 1000 mg via ORAL
  Filled 2017-01-28: qty 2

## 2017-01-28 MED ORDER — FENTANYL CITRATE (PF) 100 MCG/2ML IJ SOLN
INTRAMUSCULAR | Status: DC | PRN
  Administered 2017-01-28: 50 ug via INTRAVENOUS
  Administered 2017-01-28: 100 ug via INTRAVENOUS
  Administered 2017-01-28: 50 ug via INTRAVENOUS

## 2017-01-28 MED ORDER — ONDANSETRON 4 MG PO TBDP: 4.0000 mg | ORAL_TABLET | Freq: Four times a day (QID) | ORAL | Status: DC | PRN

## 2017-01-28 MED ORDER — DEXAMETHASONE SODIUM PHOSPHATE 10 MG/ML IJ SOLN
INTRAMUSCULAR | Status: AC
  Filled 2017-01-28: qty 4

## 2017-01-28 SURGICAL SUPPLY — 86 items
ADH SKN CLS APL DERMABOND .7 (GAUZE/BANDAGES/DRESSINGS) ×3
ALLODERM 8X16 MED THICK (Tissue) ×2 IMPLANT
BAG DECANTER FOR FLEXI CONT (MISCELLANEOUS) ×2 IMPLANT
BINDER BREAST LRG (GAUZE/BANDAGES/DRESSINGS) ×1 IMPLANT
BINDER BREAST XLRG (GAUZE/BANDAGES/DRESSINGS) IMPLANT
CANISTER SUCT 3000ML PPV (MISCELLANEOUS) ×6 IMPLANT
CHLORAPREP W/TINT 26ML (MISCELLANEOUS) ×3 IMPLANT
CLIP VESOCCLUDE MED 6/CT (CLIP) ×2 IMPLANT
CLSR STERI-STRIP ANTIMIC 1/2X4 (GAUZE/BANDAGES/DRESSINGS) ×2 IMPLANT
CONT SPEC 4OZ CLIKSEAL STRL BL (MISCELLANEOUS) ×2 IMPLANT
COVER PROBE W GEL 5X96 (DRAPES) ×2 IMPLANT
COVER SURGICAL LIGHT HANDLE (MISCELLANEOUS) ×4 IMPLANT
DERMABOND ADVANCED (GAUZE/BANDAGES/DRESSINGS) ×3
DERMABOND ADVANCED .7 DNX12 (GAUZE/BANDAGES/DRESSINGS) ×3 IMPLANT
DEVICE DISSECT PLASMABLAD 3.0S (MISCELLANEOUS) ×1 IMPLANT
DRAIN CHANNEL 15F RND FF W/TCR (WOUND CARE) IMPLANT
DRAIN CHANNEL 19F RND (DRAIN) ×1 IMPLANT
DRAPE CHEST BREAST 15X10 FENES (DRAPES) ×2 IMPLANT
DRAPE HALF SHEET 40X57 (DRAPES) ×2 IMPLANT
DRAPE INCISE IOBAN 66X45 STRL (DRAPES) IMPLANT
DRAPE ORTHO SPLIT 77X108 STRL (DRAPES) ×4
DRAPE SURG 17X23 STRL (DRAPES) ×8 IMPLANT
DRAPE SURG ORHT 6 SPLT 77X108 (DRAPES) ×2 IMPLANT
DRAPE WARM FLUID 44X44 (DRAPE) ×2 IMPLANT
DRSG PAD ABDOMINAL 8X10 ST (GAUZE/BANDAGES/DRESSINGS) ×4 IMPLANT
DRSG TEGADERM 2-3/8X2-3/4 SM (GAUZE/BANDAGES/DRESSINGS) ×2 IMPLANT
DRSG TEGADERM 4X4.75 (GAUZE/BANDAGES/DRESSINGS) ×8 IMPLANT
ELECT BLADE 4.0 EZ CLEAN MEGAD (MISCELLANEOUS)
ELECT CAUTERY BLADE 6.4 (BLADE) ×4 IMPLANT
ELECT COATED BLADE 2.86 ST (ELECTRODE) ×2 IMPLANT
ELECT REM PT RETURN 9FT ADLT (ELECTROSURGICAL) ×8
ELECTRODE BLDE 4.0 EZ CLN MEGD (MISCELLANEOUS) ×1 IMPLANT
ELECTRODE REM PT RTRN 9FT ADLT (ELECTROSURGICAL) ×3 IMPLANT
EVACUATOR SILICONE 100CC (DRAIN) ×3 IMPLANT
EXPANDER TISSUE MX 300CC (Breast) IMPLANT
GAUZE SPONGE 4X4 12PLY STRL (GAUZE/BANDAGES/DRESSINGS) ×2 IMPLANT
GLOVE BIO SURGEON STRL SZ 6 (GLOVE) ×6 IMPLANT
GLOVE BIOGEL PI IND STRL 8 (GLOVE) ×1 IMPLANT
GLOVE BIOGEL PI INDICATOR 8 (GLOVE) ×1
GLOVE ECLIPSE 7.5 STRL STRAW (GLOVE) ×2 IMPLANT
GOWN STRL REUS W/ TWL LRG LVL3 (GOWN DISPOSABLE) ×3 IMPLANT
GOWN STRL REUS W/ TWL XL LVL3 (GOWN DISPOSABLE) ×1 IMPLANT
GOWN STRL REUS W/TWL LRG LVL3 (GOWN DISPOSABLE) ×6
GOWN STRL REUS W/TWL XL LVL3 (GOWN DISPOSABLE) ×2
KIT BASIN OR (CUSTOM PROCEDURE TRAY) ×4 IMPLANT
KIT ROOM TURNOVER OR (KITS) ×4 IMPLANT
MARKER SKIN DUAL TIP RULER LAB (MISCELLANEOUS) ×2 IMPLANT
NDL 18GX1X1/2 (RX/OR ONLY) (NEEDLE) ×1 IMPLANT
NDL BLUNT 16X1.5 OR ONLY (NEEDLE) IMPLANT
NDL HYPO 25GX1X1/2 BEV (NEEDLE) ×1 IMPLANT
NEEDLE 18GX1X1/2 (RX/OR ONLY) (NEEDLE) ×2 IMPLANT
NEEDLE BLUNT 16X1.5 OR ONLY (NEEDLE) IMPLANT
NEEDLE HYPO 25GX1X1/2 BEV (NEEDLE) ×2 IMPLANT
NS IRRIG 1000ML POUR BTL (IV SOLUTION) ×6 IMPLANT
PACK GENERAL/GYN (CUSTOM PROCEDURE TRAY) ×4 IMPLANT
PAD ARMBOARD 7.5X6 YLW CONV (MISCELLANEOUS) ×4 IMPLANT
PIN SAFETY STERILE (MISCELLANEOUS) ×2 IMPLANT
PLASMABLADE 3.0S (MISCELLANEOUS) ×2
SET ASEPTIC TRANSFER (MISCELLANEOUS) ×2 IMPLANT
SOL PREP POV-IOD 4OZ 10% (MISCELLANEOUS) ×2 IMPLANT
SPECIMEN JAR X LARGE (MISCELLANEOUS) ×2 IMPLANT
STAPLER VISISTAT 35W (STAPLE) ×2 IMPLANT
SUT CHROMIC 4 0 PS 2 18 (SUTURE) IMPLANT
SUT ETHILON 2 0 FS 18 (SUTURE) ×4 IMPLANT
SUT MNCRL 3 0 RB1 (SUTURE) IMPLANT
SUT MNCRL AB 4-0 PS2 18 (SUTURE) ×2 IMPLANT
SUT MONOCRYL 3 0 RB1 (SUTURE)
SUT VIC AB 0 CT1 27 (SUTURE) ×6
SUT VIC AB 0 CT1 27XBRD ANBCTR (SUTURE) IMPLANT
SUT VIC AB 3-0 54X BRD REEL (SUTURE) ×1 IMPLANT
SUT VIC AB 3-0 BRD 54 (SUTURE)
SUT VIC AB 3-0 SH 18 (SUTURE) ×1 IMPLANT
SUT VIC AB 3-0 SH 27 (SUTURE) ×10
SUT VIC AB 3-0 SH 27X BRD (SUTURE) IMPLANT
SUT VICRYL 3 0 (SUTURE) IMPLANT
SUT VICRYL 4-0 PS2 18IN ABS (SUTURE) ×1 IMPLANT
SUT VLOC 180 0 24IN GS25 (SUTURE) IMPLANT
SYR BULB IRRIGATION 50ML (SYRINGE) ×2 IMPLANT
SYR CONTROL 10ML LL (SYRINGE) ×2 IMPLANT
TAPE STRIPS DRAPE STRL (GAUZE/BANDAGES/DRESSINGS) ×1 IMPLANT
TISSUE ALLDRM 8X16 MED THICK (Tissue) IMPLANT
TISSUE EXPANDER MX 300CC (Breast) ×2 IMPLANT
TOWEL OR 17X24 6PK STRL BLUE (TOWEL DISPOSABLE) ×4 IMPLANT
TOWEL OR 17X26 10 PK STRL BLUE (TOWEL DISPOSABLE) ×4 IMPLANT
TRAY FOLEY W/METER SILVER 16FR (SET/KITS/TRAYS/PACK) IMPLANT
TUBE CONNECTING 12X1/4 (SUCTIONS) ×4 IMPLANT

## 2017-01-28 NOTE — Transfer of Care (Signed)
Immediate Anesthesia Transfer of Care Note  Patient: ELISAVET BUEHRER  Procedure(s) Performed: Procedure(s): RIGHT TOTAL MASTECTOMY WITH SENTINEL LYMPH NODE BIOPSY (Right) RIGHT BREAST RECONSTRUCTION WITH PLACEMENT OF TISSUE EXPANDER AND ALLODERM (Right)  Patient Location: PACU  Anesthesia Type:GA combined with regional for post-op pain  Level of Consciousness: awake, alert , oriented and patient cooperative  Airway & Oxygen Therapy: Patient Spontanous Breathing and Patient connected to nasal cannula oxygen  Post-op Assessment: Report given to RN, Post -op Vital signs reviewed and stable and Patient moving all extremities X 4  Post vital signs: Reviewed and stable  Last Vitals:  Vitals:   01/28/17 1110 01/28/17 1440  BP: (!) 141/72 95/77  Pulse: 70 67  Resp: 17 12  Temp:  36.4 C  SpO2: 100% 100%    Last Pain:  Vitals:   01/28/17 1440  TempSrc:   PainSc: 0-No pain         Complications: No apparent anesthesia complications

## 2017-01-28 NOTE — Op Note (Signed)
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  After extensive preoperative workup and discussion detailed elsewhere we have elected to proceed with right total mastectomy and axillary sentinel lymph node biopsy is initial surgical treatment which will be followed by immediate reconstruction by Dr. Iran Planas.    Procedure Detail:  In the holding area the patient underwent injection of 1 mCi of technetium sulfur colloid intradermally around the right nipple. She also underwent a pectoral block. She was taken to the operating room, placed in the supine position on the operating table, and general endotracheal anesthesia induced. She was carefully positioned with her right arm extended and the entire right breast and anterior chest, axilla, and upper arm were widely sterilely prepped and draped. She received preoperative IV antibiotics. Patient timeout was performed and correct procedure verified. I used a transversely oriented small elliptical incision encompassing the nipple areolar complex. Skin and subcutaneous flaps were then developed with the plasma blade superiorly toward the clavicle, medially toward the edge of the sternum, inferiorly to the inframammary crease and laterally out to the anterior border of the latissimus dorsi which was defined. The dissection was deepened down to the chest wall circumferentially. The breast was then reflected off the chest wall medial to lateral  detaching attachments from the pectoralis and serratus working laterally. The specimen was dissected anteriorly off the anterior border of the latissimus working up toward the axilla until we had isolated the specimen down to the low axilla. The clavipectoral fascia was incised at the lateral border of the pectoralis minor. The neoprobe was then used to explore the axilla. I was able to find 4 lymph nodes with elevated counts in the range of 3000 down to about 500. There were no abnormal palpable lymph nodes. After excision of these for background in the axilla was less than 50. We then came across a low axilla with Kelly clamps and the specimen was removed and oriented with sutures and sent for permanent pathology. The wound was thoroughly irrigated and complete hemostasis assured. Dr. Iran Planas then proceeded with reconstruction as planned.    Findings: As above  Estimated Blood Loss:  Minimal         Drains: Per Dr. Iran Planas  Blood Given: none          Specimens: #1 right total mastectomy   #2 right axillary sentinel lymph nodes 4        Complications:  * No complications entered in OR log *         Disposition: PACU - hemodynamically stable.         Condition: stable

## 2017-01-28 NOTE — Op Note (Signed)
Operative Note   DATE OF OPERATION: 8.24.18  LOCATION: [facility]-observation  SURGICAL DIVISION: Plastic Surgery  PREOPERATIVE DIAGNOSES:  1. Right breast cancer multicentric invasive lobular  POSTOPERATIVE DIAGNOSES:  same  PROCEDURE:  1. Right breast reconstruction with tissue expander 2. Acellular dermis (Alloderm) for breast reconstruction 120 cm2  SURGEON: [name] MD MBA  ASSISTANT: none  ANESTHESIA:  General.   EBL: 100 ml for entire case  COMPLICATIONS: None immediate.   INDICATIONS FOR PROCEDURE:  The patient, [name], is a 77 y.o. female born on 11-Dec-1940, is here for immediate breast reconstruction following right skin sparing mastectomy.   FINDINGS: Natrelle 133-MX-11T 300 ml tissue expander placed, initial fill volume 135 ml. SN [id number]  DESCRIPTION OF PROCEDURE:  The patient was marked with the patient in the preoperative area to mark sternal notch, chest midline, anterior axillary lines and inframammary folds. The patient was taken to the operating room. SCDs were placed and IV antibiotics were given. Foley catheter placed. The patient's operative site was prepped and draped in a sterile fashion. A time out was performed and all information was confirmed to be correct. Following completion of mastectomy, reconstruction began on right side.  Lower border pectoralis muscle identified and submuscular dissection completed to accommodate the expander. Acellular dermis prepared and perforated. This was inset to chest wall with running 3-0 vicryl. The cavity was irrigated with Ancef, gentamicin, bacitracin solution and hemostasis ensured. 19Fr JP placed in cavity and secured with 2-0 nylon. The cavity was then irrigated with Betadine. Expander prepared and placed in submuscular cavity. This was secured to chest wall with 3-0 vicryl. Acellular dermis was then wrapped over lower border expander and secured to lateral pectoralis muscle with 3-0 vicryl. Laterally  the mastectomy flap over posterior axillary line was advanced anteriorly and the subcutaneous tissue and superficial fascia was secured to pectoralis muscle and acellular dermis with 0-vicryl. Skin closure completedwith 3-0 vicryl in fascial layer and 4-0 vicryl in dermis. Skin closure completed with 4-0 monocryl subcuticular and tissue adhesive. The port was accessed and filled to 135 ml of saline. Dry dressing and breast binder applied.  The patient was allowed to wake from anesthesia, extubated and taken to the recovery room in satisfactory condition.   SPECIMENS: none  DRAINS: 19 Fr drain in right reconstructed breast  [name], MD [facility] Plastic & Reconstructive Surgery [phone], pin [phone]

## 2017-01-28 NOTE — Anesthesia Preprocedure Evaluation (Signed)
Anesthesia Evaluation  Patient identified by MRN, date of birth, ID band Patient awake    Reviewed: Allergy & Precautions, H&P , NPO status , Patient's Chart, lab work & pertinent test results  Airway Mallampati: II   Neck ROM: full    Dental   Pulmonary asthma ,    breath sounds clear to auscultation       Cardiovascular hypertension, + dysrhythmias Atrial Fibrillation  Rhythm:regular Rate:Normal     Neuro/Psych PSYCHIATRIC DISORDERS Anxiety    GI/Hepatic   Endo/Other    Renal/GU stones     Musculoskeletal  (+) Arthritis ,   Abdominal   Peds  Hematology   Anesthesia Other Findings   Reproductive/Obstetrics                             Anesthesia Physical Anesthesia Plan  ASA: III  Anesthesia Plan: General   Post-op Pain Management:  Regional for Post-op pain   Induction: Intravenous  PONV Risk Score and Plan: 3 and Ondansetron, Dexamethasone, Midazolam and Treatment may vary due to age or medical condition  Airway Management Planned: Oral ETT  Additional Equipment:   Intra-op Plan:   Post-operative Plan: Extubation in OR  Informed Consent: I have reviewed the patients History and Physical, chart, labs and discussed the procedure including the risks, benefits and alternatives for the proposed anesthesia with the patient or authorized representative who has indicated his/her understanding and acceptance.     Plan Discussed with: CRNA, Anesthesiologist and Surgeon  Anesthesia Plan Comments:         Anesthesia Quick Evaluation

## 2017-01-28 NOTE — Interval H&P Note (Signed)
History and Physical Interval Note:  01/28/2017 10:58 AM  [name]  has presented today for surgery, with the diagnosis of RIGHT BREAST CANCER  The various methods of treatment have been discussed with the patient and family. After consideration of risks, benefits and other options for treatment, the patient has consented to  Procedure(s): RIGHT TOTAL MASTECTOMY WITH SENTINEL LYMPH NODE BIOPSY (Right) RIGHT BREAST RECONSTRUCTION WITH PLACEMENT OF TISSUE EXPANDER AND ALLODERM (Right) as a surgical intervention .  The patient's history has been reviewed, patient examined, no change in status, stable for surgery.  I have reviewed the patient's chart and labs.  Questions were answered to the patient's satisfaction.     [name]

## 2017-01-28 NOTE — Interval H&P Note (Signed)
History and Physical Interval Note:  01/28/2017 11:06 AM  [name]  has presented today for surgery, with the diagnosis of RIGHT BREAST CANCER  The various methods of treatment have been discussed with the patient and family. After consideration of risks, benefits and other options for treatment, the patient has consented to  Procedure(s): RIGHT TOTAL MASTECTOMY WITH SENTINEL LYMPH NODE BIOPSY (Right) RIGHT BREAST RECONSTRUCTION WITH PLACEMENT OF TISSUE EXPANDER AND ALLODERM (Right) as a surgical intervention .  The patient's history has been reviewed, patient examined, no change in status, stable for surgery.  I have reviewed the patient's chart and labs.  Questions were answered to the patient's satisfaction.     Lalonnie Shaffer T

## 2017-01-28 NOTE — Anesthesia Procedure Notes (Signed)
Anesthesia Regional Block: Pectoralis block   Pre-Anesthetic Checklist: ,, timeout performed, Correct Patient, Correct Site, Correct Laterality, Correct Procedure, Correct Position, site marked, Risks and benefits discussed,  Surgical consent,  Pre-op evaluation,  At surgeon's request and post-op pain management  Laterality: Right  Prep: chloraprep       Needles:  Injection technique: Single-shot  Needle Type: Echogenic Needle     Needle Length: 9cm  Needle Gauge: 21     Additional Needles:   Procedures: ultrasound guided,,,,,,,,  Narrative:  Start time: 01/28/2017 11:06 AM End time: 01/28/2017 11:16 AM Injection made incrementally with aspirations every 5 mL.  Performed by: Personally  Anesthesiologist: Jadelyn Elks  Additional Notes: Pt tolerated the procedure well.

## 2017-01-28 NOTE — H&P (Signed)
History of Present Illness  The patient is a 76 year old female who presents with breast cancer. She is a 76 YO post menopausal female referred by Dr. Emmit Pomfret for evaluation of recently diagnosed carcinoma of the right breast. she reports that she has been able to feel a lump in her upper right breast for about 2 years. She states she has had diagnostic imaging 2 times showing no evidence of suspicious mass with only very dense breast tissue. She has noted some enlargement of the area particularly in recent months. She recently presented for follow-up diagnostic imaging. she also states ea in the upper left breasa smaller but similar area in the upper left breast. Diagnostic mammogram showed a new 3.5 cm irregular mass with spiculated margin in the upper inner right breast middle depth. No other significant abnormalities in either breast. Ultrasound confirmed a 3.5 cm irregular solid mass in the right breast upper inner quadrant at the 1:00 position. 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 She is seen now in [facility] for initial treatment planning. She has experienced a breast lump as noted above as well as a smaller lump in the left breast with no skin changes or nipple inversion, discharge or crusting. She does not have a personal history of any previous breast problems.  Findings at that time were the following: Tumor size: 3.5 cm Tumor grade: 1, Ki-67 5% Estrogen Receptor: 90% positive Progesterone Receptor: 2% positive Her-2 neu: negative Lymph node status: negative  Subsequently the patient had a bilateral breast MRI which showed the known malignancy in the upper inner quadrant measuring up to 2.6 cm but also a small enhancing mass in the upper outer quadrant working diagnosis. MRI guided biopsy was performed showing a second invasive lobular carcinoma, ER positive PR negative.   Also  since her original diagnosis she has developed atrial fibrillation and is currently on Elaquis. She has cardiac clearance from Dr. [name]. She has seen Dr. Iran Planas to discuss reconstruction and although felt to be at somewhat higher risk is acceptable for immediate reconstruction with a tissue expander.    Past Surgical History Breast Biopsy  Right. multiple Cataract Surgery  Bilateral. Colon Polyp Removal - Colonoscopy  Foot Surgery  Bilateral. Knee Surgery  Right. Oral Surgery   Diagnostic Studies History  Colonoscopy  1-5 years ago Mammogram  within last year  Allergies  ChloraPrep One Step *ANTISEPTICS & DISINFECTANTS*  Codeine Phosphate *ANALGESICS - OPIOID*  Allergies Reconciled   Medication History  Premarin (0.625MG/GM Cream, Vaginal) Active. CeleBREX (200MG Capsule, Oral) Active. B Complex + C TR (Oral) Active. Cholecalciferol (1000UNIT Capsule, Oral) Active. Omega 3 (1000MG Capsule, Oral) Active. Red Yeast Rice (600MG Capsule, Oral) Active. Ubiquinol (100MG Capsule, Oral) Active. Aspirin (81MG Tablet DR, Oral) Active. Symbicort (160-4.5MCG/ACT Aerosol, Inhalation) Active. Triamterene-HCTZ (37.5-25MG Capsule, Oral) Active. Potassium (99MG Tablet, Oral) Active. Levocetirizine Dihydrochloride (5MG Tablet, Oral) Active. Propranolol HCl (80MG Tablet, Oral) Active. Medications Reconciled  Social History  Alcohol use  Occasional alcohol use. Caffeine use  Coffee, Tea. No drug use  Tobacco use  Never smoker.  Family History  Alcohol Abuse  Father, Mother. Bleeding disorder  Mother. Breast Cancer  Family Members In General. Heart Disease  Family Members In General. Hypertension  Mother, Son. Respiratory Condition  Father.  Pregnancy / Birth History  Age at menarche  65 years. Age of menopause  51-55 Contraceptive History  Contraceptive implant, Oral contraceptives.  Gravida  1 Length (months) of breastfeeding   3-6 Maternal age  44-25 Para  59  Other Problems  Arthritis  Asthma  Bladder Problems  Diverticulosis  General anesthesia - complications  High blood pressure  Kidney Stone  Lump In Breast  Melanoma     Review of Systems  General Not Present- Appetite Loss, Chills, Fatigue, Fever, Night Sweats, Weight Gain and Weight Loss. HEENT Present- Seasonal Allergies. Not Present- Earache, Hearing Loss, Hoarseness, Nose Bleed, Oral Ulcers, Ringing in the Ears, Sinus Pain, Sore Throat, Visual Disturbances, Wears glasses/contact lenses and Yellow Eyes. Respiratory Not Present- Bloody sputum, Chronic Cough, Difficulty Breathing, Snoring and Wheezing. Breast Present- Breast Mass, Breast Pain and Skin Changes. Not Present- Nipple Discharge. Cardiovascular Present- Rapid Heart Rate. Not Present- Chest Pain, Difficulty Breathing Lying Down, Leg Cramps, Palpitations, Shortness of Breath and Swelling of Extremities. Gastrointestinal Not Present- Abdominal Pain, Bloating, Bloody Stool, Change in Bowel Habits, Chronic diarrhea, Constipation, Difficulty Swallowing, Excessive gas, Gets full quickly at meals, Hemorrhoids, Indigestion, Nausea, Rectal Pain and Vomiting. Female Genitourinary Present- Urgency. Not Present- Frequency, Nocturia, Painful Urination and Pelvic Pain. Musculoskeletal Present- Joint Pain, Joint Stiffness and Muscle Pain. Not Present- Back Pain, Muscle Weakness and Swelling of Extremities. Neurological Present- Trouble walking. Not Present- Decreased Memory, Fainting, Headaches, Numbness, Seizures, Tingling, Tremor and Weakness. Psychiatric Present- Anxiety and Fearful. Not Present- Bipolar, Change in Sleep Pattern, Depression and Frequent crying. Endocrine Present- Hot flashes. Not Present- Cold Intolerance, Excessive Hunger, Hair Changes, Heat Intolerance and New Diabetes. Hematology Present- Blood Thinners and Easy Bruising. Not Present- Excessive bleeding, Gland problems, HIV  and Persistent Infections.  Vitals   Weight: 143.8 lb Height: 60in Body Surface Area: 1.62 m Body Mass Index: 28.08 kg/m  Temp.: 97.29F  Pulse: 65 (Regular)  P.OX: 92% (Room air) BP: 128/88 (Sitting, Left Arm, Standard)       Physical Exam  The physical exam findings are as follows: Note:General: Alert, well-developed Caucasian female, in no distress Skin: Warm and dry without rash or infection. HEENT: No palpable masses or thyromegaly. Sclera nonicteric. Lymph nodes: No cervical, supraclavicular, nodes palpable. Breasts: Extensive ecchymosis right breast post biopsy. Palpable mass upper breast unchanged. Lungs: Breath sounds clear and equal. No wheezing or increased work of breathing. Cardiovascular: Irregular rhythm without murmer. No JVD or edema. Peripheral pulses intact. No carotid bruits. Abdomen: Nondistended. Soft and nontender. No masses palpable. No organomegaly. Extremities: No edema or joint swelling or deformity. No chronic venous stasis changes. Neurologic: Alert and fully oriented. Gait normal. No focal weakness. Psychiatric: Normal mood and affect. Thought content appropriate with normal judgement and insight    Assessment & Plan  LOBULAR BREAST CANCER, RIGHT (C50.911) Impression: 76 year old female with a new diagnosis of lobular cancer of the right breast, multi centric. Clinical stage 2A, ER pos, PR pos, HER-2 neg. new-onset atrial fibrillation on anticoagulation. Findings were all discussed in detail with the patient and her husband. She will require mastectomy and the understand this. She desires reconstruction. I recommended a traditional skin sparing mastectomy and immediate tissue expander reconstruction is planned. She will need to hold ElAQUIS for 3 days preoperatively. We discussed the surgery in detail including its nature and expected recovery, risks of anesthetic complications, cardiorespiratory complications, bleeding, infection, wound  healing problems and tissue loss and risk of lymphedema. She had genetic testing which is not yet back bleed to confirm this preoperatively. Current Plans Right total mastectomy with axillary sentinel lymph node biopsy with immediate reconstruction under general  anesthesia  Hold Elaquis for 3 days prior

## 2017-01-29 ENCOUNTER — Other Ambulatory Visit: Payer: Self-pay

## 2017-01-29 ENCOUNTER — Emergency Department (HOSPITAL_COMMUNITY): Payer: Medicare Other

## 2017-01-29 ENCOUNTER — Emergency Department (HOSPITAL_COMMUNITY)
Admission: EM | Admit: 2017-01-29 | Discharge: 2017-01-30 | Disposition: A | Discharge status: Home / Self Care | Payer: Medicare Other | Attending: Emergency Medicine | Admitting: Emergency Medicine

## 2017-01-29 DIAGNOSIS — J45909 Unspecified asthma, uncomplicated: Secondary | ICD-10-CM | POA: Insufficient documentation

## 2017-01-29 DIAGNOSIS — K219 Gastro-esophageal reflux disease without esophagitis: Secondary | ICD-10-CM | POA: Insufficient documentation

## 2017-01-29 DIAGNOSIS — I10 Essential (primary) hypertension: Secondary | ICD-10-CM | POA: Insufficient documentation

## 2017-01-29 DIAGNOSIS — Z96651 Presence of right artificial knee joint: Secondary | ICD-10-CM | POA: Insufficient documentation

## 2017-01-29 DIAGNOSIS — Z7901 Long term (current) use of anticoagulants: Secondary | ICD-10-CM | POA: Diagnosis not present

## 2017-01-29 DIAGNOSIS — Z853 Personal history of malignant neoplasm of breast: Secondary | ICD-10-CM | POA: Insufficient documentation

## 2017-01-29 DIAGNOSIS — Z79899 Other long term (current) drug therapy: Secondary | ICD-10-CM | POA: Insufficient documentation

## 2017-01-29 DIAGNOSIS — E871 Hypo-osmolality and hyponatremia: Secondary | ICD-10-CM | POA: Insufficient documentation

## 2017-01-29 DIAGNOSIS — Z85828 Personal history of other malignant neoplasm of skin: Secondary | ICD-10-CM | POA: Insufficient documentation

## 2017-01-29 DIAGNOSIS — D0501 Lobular carcinoma in situ of right breast: Secondary | ICD-10-CM | POA: Diagnosis not present

## 2017-01-29 DIAGNOSIS — R072 Precordial pain: Secondary | ICD-10-CM

## 2017-01-29 DIAGNOSIS — R0789 Other chest pain: Secondary | ICD-10-CM | POA: Diagnosis present

## 2017-01-29 LAB — CBC WITH DIFFERENTIAL/PLATELET
Basophils Absolute: 0 10*3/uL (ref 0.0–0.1)
Basophils Relative: 0 %
EOS PCT: 0 %
Eosinophils Absolute: 0 10*3/uL (ref 0.0–0.7)
HCT: 34.8 % — ABNORMAL LOW (ref 36.0–46.0)
Hemoglobin: 12.1 g/dL (ref 12.0–15.0)
LYMPHS ABS: 1.7 10*3/uL (ref 0.7–4.0)
LYMPHS PCT: 16 %
MCH: 31.9 pg (ref 26.0–34.0)
MCHC: 34.8 g/dL (ref 30.0–36.0)
MCV: 91.8 fL (ref 78.0–100.0)
MONO ABS: 1 10*3/uL (ref 0.1–1.0)
MONOS PCT: 9 %
Neutro Abs: 8 10*3/uL — ABNORMAL HIGH (ref 1.7–7.7)
Neutrophils Relative %: 75 %
PLATELETS: 282 10*3/uL (ref 150–400)
RBC: 3.79 MIL/uL — AB (ref 3.87–5.11)
RDW: 13 % (ref 11.5–15.5)
WBC: 10.7 10*3/uL — ABNORMAL HIGH (ref 4.0–10.5)

## 2017-01-29 LAB — COMPREHENSIVE METABOLIC PANEL
ALBUMIN: 3.4 g/dL — AB (ref 3.5–5.0)
ALT: 10 U/L — AB (ref 14–54)
AST: 29 U/L (ref 15–41)
Alkaline Phosphatase: 53 U/L (ref 38–126)
Anion gap: 11 (ref 5–15)
BUN: 9 mg/dL (ref 6–20)
CHLORIDE: 89 mmol/L — AB (ref 101–111)
CO2: 22 mmol/L (ref 22–32)
Calcium: 9 mg/dL (ref 8.9–10.3)
Creatinine, Ser: 1 mg/dL (ref 0.44–1.00)
GFR calc Af Amer: >60 mL/min (ref >60)
GFR calc non Af Amer: 55 mL/min — ABNORMAL LOW (ref >60)
GLUCOSE: 112 mg/dL — AB (ref 65–99)
POTASSIUM: 3.4 mmol/L — AB (ref 3.5–5.1)
Sodium: 122 mmol/L — ABNORMAL LOW (ref 135–145)
Total Bilirubin: 0.9 mg/dL (ref 0.3–1.2)
Total Protein: 6.1 g/dL — ABNORMAL LOW (ref 6.5–8.1)

## 2017-01-29 LAB — I-STAT TROPONIN, ED: Troponin i, poc: 0.01 ng/mL (ref 0.00–0.08)

## 2017-01-29 MED ORDER — GI COCKTAIL ~~LOC~~
30.0000 mL | Freq: Once | ORAL | Status: AC
  Administered 2017-01-29: 30 mL via ORAL
  Filled 2017-01-29: qty 30

## 2017-01-29 MED ORDER — METHOCARBAMOL 500 MG PO TABS: 500.0000 mg | ORAL_TABLET | Freq: Three times a day (TID) | ORAL | 0 refills | Status: DC | PRN

## 2017-01-29 MED ORDER — HYDROMORPHONE HCL 1 MG/ML IJ SOLN
1.0000 mg | Freq: Once | INTRAMUSCULAR | Status: DC
  Administered 2017-01-29: 1 mg via INTRAVENOUS

## 2017-01-29 MED ORDER — RIVAROXABAN 20 MG PO TABS: 20.0000 mg | ORAL_TABLET | Freq: Every day | ORAL | 0 refills | Status: DC

## 2017-01-29 MED ORDER — ONDANSETRON HCL 4 MG/2ML IJ SOLN
4.0000 mg | Freq: Once | INTRAMUSCULAR | Status: AC
  Administered 2017-01-29: 4 mg via INTRAVENOUS
  Filled 2017-01-29: qty 2

## 2017-01-29 MED ORDER — OXYCODONE HCL 5 MG PO TABS: 5.0000 mg | ORAL_TABLET | ORAL | 0 refills | Status: DC | PRN

## 2017-01-29 MED ORDER — DOXYCYCLINE HYCLATE 50 MG PO CAPS: 50.0000 mg | ORAL_CAPSULE | Freq: Two times a day (BID) | ORAL | 0 refills | Status: DC

## 2017-01-29 MED ORDER — HYDROMORPHONE HCL 1 MG/ML IJ SOLN
0.5000 mg | Freq: Once | INTRAMUSCULAR | Status: DC
  Filled 2017-01-29: qty 1

## 2017-01-29 NOTE — Discharge Summary (Signed)
Physician Discharge Summary  Patient ID: [name] MRN: [id number] DOB/AGE: Mar 13, 1941 76 y.o.  Admit date: 01/28/2017 Discharge date: 01/29/2017  Admission Diagnoses: Right breast cancer  Discharge Diagnoses:  Active Problems:   Breast cancer, right [facility])   Discharged Condition: stable  Hospital Course: Post operatively patient did well, tolerating diet and ambulatory in room. Pain reported well controlled with oxycodone. She has currently held her Xarelto for surgery; this was discussed preoperatively with her Cardiologist and plan to resume 8.29.18. This was communicated with patient and placed in discharge instructions.   Treatments: surgery: right mastectomy with SLN, right breast reconstruction with tissue expander, acellular dermis  Discharge Exam: Blood pressure 113/70, pulse 65, temperature (!) 97.4 F (36.3 C), temperature source Oral, resp. rate 18, height 5' (1.524 m), weight 68.4 kg (150 lb 11.2 oz), SpO2 99 %. Incision/Wound: ecchymoses of skin flaps, incision dry intact, no hematoma, drain serosanguinous  Disposition: 01-Home or Self Care  Discharge Instructions    Call MD for:  redness, tenderness, or signs of infection (pain, swelling, bleeding, redness, odor or green/yellow discharge around incision site)    Complete by:  As directed    Call MD for:  temperature >100.5    Complete by:  As directed    Discharge instructions    Complete by:  As directed    Ok to remove dressings and shower am 8.26.18. Soap and water ok, pat incision dry. No creams or ointments over incisions. Do not let drain dangle in shower, attach to lanyard or similar.Strip and record drain twice daily and bring log to clinic visit.  Breast binder or soft compression bra all other times.  Ok to raise arms above shoulders for bathing and dressing.  No house yard work or exercise until cleared by MD.   Ardelle Park ON AUGUST 29.2018   Driving Restrictions    Complete by:  As  directed    No driving for 2 weeks   Lifting restrictions    Complete by:  As directed    No lifting > 5 lbs until cleared by MD   Resume previous diet    Complete by:  As directed      Allergies as of 01/29/2017      Reactions   Chlorhexidine Gluconate Hives, Swelling, Rash   SWELLING REACTION UNSPECIFIED    "ChloraPrep "   Codeine Nausea Only      Medication List    TAKE these medications   albuterol 108 (90 Base) MCG/ACT inhaler Commonly known as:  PROVENTIL HFA;VENTOLIN HFA Inhale 2 puffs into the lungs every 6 (six) hours as needed. For wheezing/shortness of breath   ALPRAZolam 0.5 MG tablet Commonly known as:  XANAX Take 0.5-1 mg by mouth daily as needed for anxiety.   anastrozole 1 MG tablet Commonly known as:  ARIMIDEX Take 1 tablet (1 mg total) by mouth daily.   B-complex with vitamin C tablet Take 1 tablet by mouth daily.   BREO ELLIPTA 100-25 MCG/INH Aepb Generic drug:  fluticasone furoate-vilanterol Inhale 1 puff into the lungs daily.   cholecalciferol 1000 units tablet Commonly known as:  VITAMIN D Take 1,000 Units by mouth daily.   desonide 0.05 % cream Commonly known as:  DESOWEN Apply 1 application topically 2 (two) times daily as needed (for skin irritation.).   doxycycline 50 MG capsule Commonly known as:  VIBRAMYCIN Take 1 capsule (50 mg total) by mouth 2 (two) times daily.   FOLIC ACID PO Take 1 tablet  by mouth daily.   levocetirizine 5 MG tablet Commonly known as:  XYZAL Take 5 mg by mouth at bedtime.   methocarbamol 500 MG tablet Commonly known as:  ROBAXIN Take 1 tablet (500 mg total) by mouth every 8 (eight) hours as needed for muscle spasms.   MILK THISTLE PO Take 1 capsule by mouth 3 (three) times daily.   NON FORMULARY Take 1 capsule by mouth daily. Natto-K   omega-3 acid ethyl esters 1 g capsule Commonly known as:  LOVAZA Take 1 g by mouth daily.   oxyCODONE 5 MG immediate release tablet Commonly known as:  Oxy  IR/ROXICODONE Take 1 tablet (5 mg total) by mouth every 4 (four) hours as needed for moderate pain.   Potassium 99 MG Tabs Take 99 mg by mouth daily.   PROBIOTIC PO Take 1 capsule by mouth daily as needed (stomach).   propranolol ER 80 MG 24 hr capsule Commonly known as:  INDERAL LA Take 80 mg by mouth daily.   Red Yeast Rice Extract Powd Take 1 tablet by mouth daily.   RESTASIS 0.05 % ophthalmic emulsion Generic drug:  cycloSPORINE Place 1 drop into both eyes daily.   rivaroxaban 20 MG Tabs tablet Commonly known as:  XARELTO Take 1 tablet (20 mg total) by mouth daily with supper. May resume this starting February 02, 2017 What changed:  additional instructions   tetrahydrozoline-zinc 0.05-0.25 % ophthalmic solution Commonly known as:  VISINE-AC Place 2 drops into both eyes 3 (three) times daily as needed (for allergy eyes/dry eye).   traMADol 50 MG tablet Commonly known as:  ULTRAM Take 50 mg by mouth every 6 (six) hours as needed. for pain   triamterene-hydrochlorothiazide 37.5-25 MG tablet Commonly known as:  MAXZIDE-25 Take 1 tablet by mouth every morning.   TURMERIC PO Take 1 capsule by mouth daily.   UBIQUINOL PO Take 1 tablet by mouth daily.   VITAMIN C PO Take 1,000 mg by mouth 2 (two) times daily.            Discharge Care Instructions        Start     Ordered   01/29/17 0000  methocarbamol (ROBAXIN) 500 MG tablet  Every 8 hours PRN     01/29/17 0936   01/29/17 0000  oxyCODONE (OXY IR/ROXICODONE) 5 MG immediate release tablet  Every 4 hours PRN     01/29/17 0936   01/29/17 0000  rivaroxaban (XARELTO) 20 MG TABS tablet  Daily with supper     01/29/17 0936   01/29/17 0000  Resume previous diet     01/29/17 0936   01/29/17 0000  Driving Restrictions    Comments:  No driving for 2 weeks   01/29/17 0936   01/29/17 0000  Lifting restrictions    Comments:  No lifting > 5 lbs until cleared by MD   01/29/17 0936   01/29/17 0000  Call MD for:   temperature >100.5     01/29/17 0936   01/29/17 0000  Call MD for:  redness, tenderness, or signs of infection (pain, swelling, bleeding, redness, odor or green/yellow discharge around incision site)     01/29/17 0936   01/29/17 0000  Discharge instructions    Comments:  Ok to remove dressings and shower am 8.26.18. Soap and water ok, pat incision dry. No creams or ointments over incisions. Do not let drain dangle in shower, attach to lanyard or similar.Strip and record drain twice daily and bring log to clinic  visit.  Breast binder or soft compression bra all other times.  Ok to raise arms above shoulders for bathing and dressing.  No house yard work or exercise until cleared by MD.   Ardelle Park Pernell Dupre 29.2018   01/29/17 8299   01/29/17 0000  doxycycline (VIBRAMYCIN) 50 MG capsule  2 times daily     01/29/17 3716     Follow-up Information    Excell Seltzer, MD. Schedule an appointment as soon as possible for a visit in 2 week(s).   Specialty:  General Surgery Contact information: [address] [place] [address] [phone]        [name], MD Follow up in 1 week(s).   Specialty:  Plastic Surgery Why:  as scheduled Contact information: [place] [address] [phone]           Signed: [name] 01/29/2017, 9:36 AM

## 2017-01-29 NOTE — ED Triage Notes (Signed)
Pt arrived via [facility] after experiencing cest tightness this evening. Pt was discharged from the the surgery center this morning after having a procedure done on her right breast. Pt is alert and oriented x4. Stated pain is 5/10. EMS gave 0.4mg  nitro x1 en route.

## 2017-01-29 NOTE — Discharge Instructions (Signed)
[facility] °[phone] ° °MASTECTOMY: POST OP INSTRUCTIONS ° °Always review your discharge instruction sheet given to you by the facility where your surgery was performed. °IF YOU HAVE DISABILITY OR FAMILY LEAVE FORMS, YOU MUST BRING THEM TO THE OFFICE FOR PROCESSING.   °DO NOT GIVE THEM TO YOUR DOCTOR. °A prescription for pain medication may be given to you upon discharge.  Take your pain medication as prescribed, if needed.  If narcotic pain medicine is not needed, then you may take acetaminophen (Tylenol) or ibuprofen (Advil) as needed. °1. Take your usually prescribed medications unless otherwise directed. °2. If you need a refill on your pain medication, please contact your pharmacy.  They will contact our office to request authorization.  Prescriptions will not be filled after 5pm or on week-ends. °3. You should follow a light diet the first few days after arrival home, such as soup and crackers, etc.  Resume your normal diet the day after surgery. °4. Most patients will experience some swelling and bruising on the chest and underarm.  Ice packs will help.  Swelling and bruising can take several days to resolve.  °5. It is common to experience some constipation if taking pain medication after surgery.  Increasing fluid intake and taking a stool softener (such as Colace) will usually help or prevent this problem from occurring.  A mild laxative (Milk of Magnesia or Miralax) should be taken according to package instructions if there are no bowel movements after 48 hours. °6. Unless discharge instructions indicate otherwise, leave your bandage dry and in place until your next appointment in 3-5 days.  You may take a limited sponge bath.  No tube baths or showers until the drains are removed.  You may have steri-strips (small skin tapes) in place directly over the incision.  These strips should be left on the skin for 7-10 days.  If your surgeon used skin glue on the incision, you may  shower in 24 hours.  The glue will flake off over the next 2-3 weeks.  Any sutures or staples will be removed at the office during your follow-up visit. °7. DRAINS:  If you have drains in place, it is important to keep a list of the amount of drainage produced each day in your drains.  Before leaving the hospital, you should be instructed on drain care.  Call our office if you have any questions about your drains. °8. ACTIVITIES:  You may resume regular (light) daily activities beginning the next day--such as daily self-care, walking, climbing stairs--gradually increasing activities as tolerated.  You may have sexual intercourse when it is comfortable.  Refrain from any heavy lifting or straining until approved by your doctor. °a. You may drive when you are no longer taking prescription pain medication, you can comfortably wear a seatbelt, and you can safely maneuver your car and apply brakes. °b. RETURN TO WORK:  __________________________________________________________ °9. You should see your doctor in the office for a follow-up appointment approximately 3-5 days after your surgery.  Your doctor’s nurse will typically make your follow-up appointment when she calls you with your pathology report.  Expect your pathology report 2-3 business days after your surgery.  You may call to check if you do not hear from us after three days.   °10. OTHER INSTRUCTIONS: ______________________________________________________________________________________________ ____________________________________________________________________________________________ °WHEN TO CALL YOUR DOCTOR: °1. Fever over 101.0 °2. Nausea and/or vomiting °3. Extreme swelling or bruising °4. Continued bleeding from incision. °5. Increased pain, redness, or drainage from the incision. °  The clinic staff is available to answer your questions during regular business hours.  Please don’t hesitate to call and ask to speak to one of the nurses for clinical  concerns.  If you have a medical emergency, go to the nearest emergency room or call 911.  A surgeon from [facility] is always on call at the hospital. °[address], [address], [place], [place]  [address] ? [address] [address], [place], [place]   [address] °[phone] ? [phone] ? FAX [phone] °Web site: [email or web address] °

## 2017-01-29 NOTE — ED Notes (Signed)
Patient transported to X-ray 

## 2017-01-29 NOTE — ED Notes (Addendum)
Pharmacy notified of discrepancy with 0.5mg  dilaudid administration. Pharmacy advised RN to make note in pyxis. Annotated as a 0.001 waste in pyxis.

## 2017-01-29 NOTE — Progress Notes (Signed)
1 Day Post-Op  Subjective: Alert.  Pleasant.  Cooperative.  No distress.  Objective: Vital signs in last 24 hours: Temp:  [97.4 F (36.3 C)-98.6 F (37 C)] 97.4 F (36.3 C) (08/25 0455) Pulse Rate:  [53-96] 65 (08/25 0455) Resp:  [12-23] 18 (08/25 0455) BP: (95-146)/(62-91) 113/70 (08/25 0455) SpO2:  [94 %-100 %] 98 % (08/25 0455) Weight:  [68.4 kg (150 lb 11.2 oz)] 68.4 kg (150 lb 11.2 oz) (08/24 1630) Last BM Date: 01/28/17  Intake/Output from previous day: 08/24 0701 - 08/25 0700 In: 2710.5 [P.O.:1200; I.V.:1410.5; IV Piggyback:100] Out: 2185 [Urine:1850; Drains:235; Blood:100] Intake/Output this shift: Total I/O In: 893.8 [P.O.:480; I.V.:313.8; IV Piggyback:100] Out: 705 [Urine:600; Drains:105]   EXAM: General appearance: alert.  Mental status normal.  No distress.  Pleasant. Resp: clear to auscultation bilaterally Breasts: right mastectomy wound looks good.  No necrosis or hematoma.  Drainage serosanguineous.  Lab Results:  Results for orders placed or performed during the hospital encounter of 01/28/17 (from the past 24 hour(s))  PT- INR Day of Surgery     Status: None   Collection Time: 01/28/17 10:46 AM  Result Value Ref Range   Prothrombin Time 13.9 11.4 - 15.2 seconds   INR 1.07      Studies/Results: No results found.  . cycloSPORINE  1 drop Both Eyes Daily  . enoxaparin (LOVENOX) injection  40 mg Subcutaneous Q24H  . fluticasone furoate-vilanterol  1 puff Inhalation Daily  . loratadine  10 mg Oral Daily  . propranolol ER  80 mg Oral Daily  . triamterene-hydrochlorothiazide  1 tablet Oral q morning - 10a     Assessment/Plan: s/p Procedure(s): RIGHT TOTAL MASTECTOMY WITH SENTINEL LYMPH NODE BIOPSY RIGHT BREAST RECONSTRUCTION WITH PLACEMENT OF TISSUE EXPANDER AND ALLODERM  POD #1.  Right total mastectomy with sentinel lymph node biopsy and implant-based reconstruction Stable and doing very well Wound and drain care per Dr. Iran Planas Possible  discharge today if okay with Dr. Iran Planas  Return to see Dr. Excell Seltzer in 2 weeks I told her to expect [place] to call the pathology report to her Tuesday or Wednesday next week.    @PROBHOSP @  LOS: 0 days    [name] M 01/29/2017  . .prob

## 2017-01-29 NOTE — ED Provider Notes (Signed)
[facility] DEPT Provider Note   CSN: [id number] Arrival date & time: 01/29/17  1601     History   Chief Complaint No chief complaint on file.   HPI [name] is a 76 y.o. female.  [name] is a 76 y.o. Female with a history of breast cancer, and A-fib who presents to the ED complaining of chest tightness and acid reflux symptoms starting around 6 pm today. Patient was with breast cancer and had a right breast mastectomy partial breast reconstruction yesterday. This was performed by Dr. Excell Seltzer and Dr. Victory Dakin. She was discharged from the hospital earlier today. Patient reports she ate some bacon while in the hospital in a short while after she developed some symptoms of acid reflux with burping and belching. She reports right after this, around 6 PM, she developed some chest tightness and was worried she might be having a heart attack. She denies any shortness of breath or trouble breathing. She denies any coughing or hemoptysis. She reports that her symptoms feel better with burping. She received a nitroglycerin by EMS and this did not help with her symptoms. She is typically on Xarelto for her A-fib, but this was stopped this week for her surgery. She will restart Xarelto next week. She denies fevers, coughing, shortness of breath, trouble breathing, hemoptysis, leg pain, leg swelling, abdominal pain, nausea, vomiting, numbness, tingling, weakness or rashes.   The history is provided by the patient and medical records. No language interpreter was used.    Past Medical History:  Diagnosis Date  . Anemia    history of  . Anxiety   . Arthritis   . Asthma   . Breast cancer ([place])   . Cancer ([place])    skin cancers, one melanoma   . Complication of anesthesia    patient woke up during colonoscopy  . Concussion    8 yrs. ago due to being thrown from a horse  . Dislocated elbow 10/12/2016   left  . Dysrhythmia 12/2016   A-fib  . Family history of breast cancer   .  Hypertension   . 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 Kidney stones    hx of   . Melanoma ([place])   . Persistent atrial fibrillation ([place]) 12/16/2016   CHA2DS2VASC score 3  . Pneumonia    hx of     Patient Active Problem List   Diagnosis Date Noted  . Breast cancer, right ([place]) 01/28/2017  . Genetic testing 12/24/2016  . Persistent atrial fibrillation ([place]) 12/16/2016  . Long term current use of anticoagulant therapy 12/16/2016  . 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 Family history of breast cancer   . Melanoma ([place])   . Malignant neoplasm of upper-inner quadrant of right breast in female, estrogen receptor positive ([place]) 11/19/2016  . OA (osteoarthritis) of knee 01/29/2013    Past Surgical History:  Procedure Laterality Date  . brachial skin removal due to deforimity Bilateral   . BUNIONECTOMY     left   . CATARACT EXTRACTION     bilaterla cataract surgery   . COLONOSCOPY W/ POLYPECTOMY    . KNEE ARTHROSCOPY     left   . MELANOMA EXCISION Left   . polyp removed from uterus     . right wrist pinning     . skin cancers removed    . TOTAL KNEE ARTHROPLASTY Right 01/29/2013   Procedure: RIGHT TOTAL KNEE ARTHROPLASTY;  Surgeon: Gearlean Alf, MD;  Location: [facility];  Service: Orthopedics;  Laterality: Right;    OB History    No data available       Home Medications    Prior to Admission medications   Medication Sig Start Date End Date Taking? Authorizing Provider  albuterol (PROVENTIL HFA;VENTOLIN HFA) 108 (90 Base) MCG/ACT inhaler Inhale 2 puffs into the lungs every 6 (six) hours as needed. For wheezing/shortness of breath 06/03/16  Yes [provider]  ALPRAZolam Duanne Moron) 0.5 MG tablet Take 0.5-1 mg by mouth daily as needed for anxiety.   Yes [provider]  Ascorbic Acid (VITAMIN C PO) Take 1,000 mg by mouth 2 (two) times daily.   Yes [provider]  B Complex-C (B-COMPLEX WITH VITAMIN C) tablet Take 1 tablet by mouth daily.    Yes [provider]  BREO ELLIPTA 100-25 MCG/INH AEPB Inhale 1 puff into the lungs daily. 12/22/16  Yes [provider]  cholecalciferol (VITAMIN D) 1000 units tablet Take 1,000 Units by mouth daily.   Yes [provider]  desonide (DESOWEN) 0.05 % cream Apply 1 application topically 2 (two) times daily as needed (for skin irritation.).   Yes [provider]  doxycycline (VIBRAMYCIN) 50 MG capsule Take 1 capsule (50 mg total) by mouth 2 (two) times daily. 01/29/17  Yes Thimmappa, Arnoldo Hooker, MD  FOLIC ACID PO Take 1 tablet by mouth daily.   Yes [provider]  levocetirizine (XYZAL) 5 MG tablet Take 5 mg by mouth at bedtime.    Yes [provider]  methocarbamol (ROBAXIN) 500 MG tablet Take 1 tablet (500 mg total) by mouth every 8 (eight) hours as needed for muscle spasms. 01/29/17  Yes Thimmappa, Arnoldo Hooker, MD  MILK THISTLE PO Take 1 capsule by mouth 3 (three) times daily.   Yes [provider]  NON FORMULARY Take 1 capsule by mouth daily. Natto-K   Yes [provider]  omega-3 acid ethyl esters (LOVAZA) 1 g capsule Take 1 g by mouth daily.   Yes [provider]  oxyCODONE (OXY IR/ROXICODONE) 5 MG immediate release tablet Take 1 tablet (5 mg total) by mouth every 4 (four) hours as needed for moderate pain. 01/29/17  Yes [name], MD  Potassium 99 MG TABS Take 99 mg by mouth daily.    Yes [provider]  Probiotic Product (PROBIOTIC PO) Take 1 capsule by mouth daily as needed (stomach).   Yes [provider]  propranolol ER (INDERAL LA) 80 MG 24 hr capsule Take 80 mg by mouth daily.   Yes [provider]  Red Yeast Rice Extract POWD Take 1 tablet by mouth daily.   Yes [provider]  RESTASIS 0.05 % ophthalmic emulsion Place 1 drop into both eyes daily.  04/20/16  Yes [provider]  tetrahydrozoline-zinc (VISINE-AC) 0.05-0.25 % ophthalmic solution Place 2 drops into both  eyes 3 (three) times daily as needed (for allergy eyes/dry eye).   Yes [provider]  traMADol (ULTRAM) 50 MG tablet Take 50 mg by mouth every 6 (six) hours as needed. for pain 12/16/16  Yes [provider]  triamterene-hydrochlorothiazide (MAXZIDE-25) 37.5-25 MG per tablet Take 1 tablet by mouth every morning.    Yes [provider]  TURMERIC PO Take 1 capsule by mouth daily.   Yes [provider]  UBIQUINOL PO Take 1 tablet by mouth daily.   Yes [provider]  anastrozole (ARIMIDEX) 1 MG tablet Take 1 tablet (1 mg total) by mouth  daily. Patient not taking: Reported on 01/29/2017 11/24/16   Magrinat, Virgie Dad, MD  ranitidine (ZANTAC) 150 MG capsule Take 1 capsule (150 mg total) by mouth daily. 01/30/17   Waynetta Pean, PA-C  rivaroxaban (XARELTO) 20 MG TABS tablet Take 1 tablet (20 mg total) by mouth daily with supper. May resume this starting February 02, 2017 01/29/17   [name], MD    Family History Family History  Problem Relation Age of Onset  . COPD Father   . Breast cancer Other        dx in her 22s-30s    Social History Social History  Substance Use Topics  . Smoking status: Never Smoker  . Smokeless tobacco: Never Used  . Alcohol use Yes     Comment: occ     Allergies   Chlorhexidine gluconate and Codeine   Review of Systems Review of Systems  Constitutional: Negative for chills and fever.  HENT: Negative for congestion and sore throat.   Eyes: Negative for visual disturbance.  Respiratory: Positive for chest tightness. Negative for cough, shortness of breath and wheezing.   Cardiovascular: Positive for chest pain. Negative for palpitations.  Gastrointestinal: Negative for abdominal pain, nausea and vomiting.  Genitourinary: Negative for dysuria.  Musculoskeletal: Negative for back pain and neck pain.  Skin: Negative for rash.  Neurological: Negative for syncope, weakness, light-headedness, numbness and  headaches.     Physical Exam Updated Vital Signs BP 101/69   Pulse 61   Resp 10   SpO2 95%   Physical Exam  Constitutional: She is oriented to person, place, and time. She appears well-developed and well-nourished. No distress.  Nontoxic appearing.  HENT:  Head: Normocephalic and atraumatic.  Mouth/Throat: Oropharynx is clear and moist.  Eyes: Pupils are equal, round, and reactive to light. Conjunctivae are normal. Right eye exhibits no discharge. Left eye exhibits no discharge.  Neck: Normal range of motion. Neck supple. No JVD present. No tracheal deviation present.  Cardiovascular: Normal rate, normal heart sounds and intact distal pulses.  Exam reveals no gallop and no friction rub.   No murmur heard. Irregular irregular rhythm with a heart rate in the 70s. A-fib on the monitor. Bilateral radial, posterior tibialis and dorsalis pedis pulses are intact.    Pulmonary/Chest: Effort normal and breath sounds normal. No stridor. No respiratory distress. She has no wheezes. She has no rales.  Lungs are clear to ascultation bilaterally. Symmetric chest expansion bilaterally. No increased work of breathing. No rales or rhonchi.   S/p right breast mastectomy with drain in place. Serous fluid in drain.   Abdominal: Soft. There is no tenderness. There is no guarding.  Musculoskeletal: Normal range of motion. She exhibits no edema or tenderness.  No LE edema or TTP.   Lymphadenopathy:    She has no cervical adenopathy.  Neurological: She is alert and oriented to person, place, and time. No sensory deficit. She exhibits normal muscle tone. Coordination normal.  Skin: Skin is warm and dry. Capillary refill takes less than 2 seconds. No rash noted. She is not diaphoretic. No erythema. No pallor.  Psychiatric: She has a normal mood and affect. Her behavior is normal.  Nursing note and vitals reviewed.    ED Treatments / Results  Labs (all labs ordered are listed, but only abnormal results  are displayed) Labs Reviewed  COMPREHENSIVE METABOLIC PANEL - Abnormal; Notable for the following:       Result Value   Sodium 122 (*)    Potassium  3.4 (*)    Chloride 89 (*)    Glucose, Bld 112 (*)    Total Protein 6.1 (*)    Albumin 3.4 (*)    ALT 10 (*)    GFR calc non Af Amer 55 (*)    All other components within normal limits  CBC WITH DIFFERENTIAL/PLATELET - Abnormal; Notable for the following:    WBC 10.7 (*)    RBC 3.79 (*)    HCT 34.8 (*)    Neutro Abs 8.0 (*)    All other components within normal limits  I-STAT TROPONIN, ED  I-STAT TROPONIN, ED    EKG  EKG Interpretation  Date/Time:  Saturday January 29 2017 19:33:53 EDT Ventricular Rate:  79 PR Interval:    QRS Duration: 93 QT Interval:  361 QTC Calculation: 414 R Axis:   64 Text Interpretation:  Atrial fibrillation Confirmed by Tanna Furry [phone]) on 01/29/2017 7:37:30 PM       Radiology Dg Chest 2 View  Result Date: 01/29/2017 CLINICAL DATA:  Chest tightness EXAM: CHEST  2 VIEW COMPARISON:  12/14/2016 FINDINGS: Cardiac shadow is within normal limits. The lungs are well aerated bilaterally. No focal infiltrate or sizable effusion is seen. Breast expander is noted on the right with a surgical drain in place. No bony abnormality is noted. IMPRESSION: Postsurgical changes without acute abnormality. Electronically Signed   By: Inez Catalina M.D.   On: 01/29/2017 20:56    Procedures Procedures (including critical care time)  Medications Ordered in ED Medications  HYDROmorphone (DILAUDID) injection 0.5 mg (0.5 mg Intravenous Not Given 01/29/17 2249)  ondansetron (ZOFRAN) injection 4 mg (4 mg Intravenous Given 01/29/17 2122)  gi cocktail (Maalox,Lidocaine,Donnatal) (30 mLs Oral Given 01/29/17 2122)     Initial Impression / Assessment and Plan / ED Course  I have reviewed the triage vital signs and the nursing notes.  Pertinent labs & imaging results that were available during my care of the patient were  reviewed by me and considered in my medical decision making (see chart for details).    This is a 76 y.o. Female with a history of breast cancer, and A-fib who presents to the ED complaining of chest tightness and acid reflux symptoms starting around 6 pm today. Patient was with breast cancer and had a right breast mastectomy partial breast reconstruction yesterday. This was performed by Dr. Excell Seltzer and Dr. Victory Dakin. She was discharged from the hospital earlier today. Patient reports she ate some bacon while in the hospital in a short while after she developed some symptoms of acid reflux with burping and belching. She reports right after this, around 6 PM, she developed some chest tightness and was worried she might be having a heart attack. She denies any shortness of breath or trouble breathing. She denies any coughing or hemoptysis. She reports that her symptoms feel better with burping. She received a nitroglycerin by EMS and this did not help with her symptoms. She is typically on Xarelto for her A-fib, but this was stopped this week for her surgery. She will restart Xarelto next week. She denies fevers, coughing, shortness of breath, trouble breathing, hemoptysis.   On exam the patient is afebrile nontoxic appearing. Right breast appears to be healing well with no signs of infection. Drainage tube is in place and has serous fluid. No purulent drainage. Lungs are clear to auscultation bilaterally. EKG shows atrial fibrillation. Initial and delta troponin is not elevated. CBC is remarkable for white count of 10,700. Chest x-ray  shows postsurgical changes without acute abnormality.  CMP is remarkable for sodium of 122 with potassium of 3.4. I suspect his sodium is related to the patient receiving fluids from her surgery yesterday and still taking her diuretic triamterene-hydrochlorothiazide. She has no symptoms of hyponatremia. Will have her hold her triamterene-Hydrocorothiazine for 48 hours and have  this rechecked by PCP next week. Patient agrees.  At reevaluation following pain medication and GI cocktail patient puts her chest pain has completely resolved. She had no further chest pain during her emergency visit. Suspect this may be related to reflux as this was helped with GI cocktail. We'll discharge with pressure and for ranitidine and discussed at instructions to help with reflux. Advice for hyponatremia as above. I advised the patient to follow-up with their primary care provider this week. I advised the patient to return to the emergency department with new or worsening symptoms or new concerns. The patient verbalized understanding and agreement with plan.    This patient was discussed with Dr. Jeneen Rinks who agrees with assessment and plan.   Final Clinical Impressions(s) / ED Diagnoses   Final diagnoses:  Precordial pain  Hyponatremia  Gastroesophageal reflux disease, esophagitis presence not specified    New Prescriptions New Prescriptions   RANITIDINE (ZANTAC) 150 MG CAPSULE    Take 1 capsule (150 mg total) by mouth daily.     Waynetta Pean, PA-C 01/30/17 0136    Tanna Furry, MD 02/08/17 [phone]

## 2017-01-30 LAB — I-STAT TROPONIN, ED: TROPONIN I, POC: 0 ng/mL (ref 0.00–0.08)

## 2017-01-30 MED ORDER — RANITIDINE HCL 150 MG PO CAPS: 150.0000 mg | ORAL_CAPSULE | Freq: Every day | ORAL | 0 refills | Status: DC

## 2017-01-30 NOTE — Discharge Instructions (Signed)
Please do not take your triamterene-hydrochlorothiazide (diuretic medication) for two days due to your low sodium (hyponatremia). I suspect this low sodium was caused from you receiving fluids during surgery and then still taking the diuretic. Sodium was 122 today. Please follow up next week with primary care and have your sodium level rechecked.

## 2017-01-30 NOTE — ED Notes (Signed)
Pt stable, ambulatory, states understanding of discharge instructions 

## 2017-01-31 ENCOUNTER — Encounter (HOSPITAL_COMMUNITY): Payer: Self-pay | Admitting: General Surgery

## 2017-01-31 NOTE — Anesthesia Postprocedure Evaluation (Signed)
Anesthesia Post Note  Patient: [name]  Procedure(s) Performed: Procedure(s) (LRB): RIGHT TOTAL MASTECTOMY WITH SENTINEL LYMPH NODE BIOPSY (Right) RIGHT BREAST RECONSTRUCTION WITH PLACEMENT OF TISSUE EXPANDER AND ALLODERM (Right)     Patient location during evaluation: PACU Anesthesia Type: General Level of consciousness: awake and alert Pain management: pain level controlled Vital Signs Assessment: post-procedure vital signs reviewed and stable Respiratory status: spontaneous breathing, nonlabored ventilation, respiratory function stable and patient connected to nasal cannula oxygen Cardiovascular status: blood pressure returned to baseline and stable Postop Assessment: no signs of nausea or vomiting Anesthetic complications: no    Last Vitals:  Vitals:   01/29/17 0736 01/29/17 1433  BP:  (!) 151/87  Pulse:  79  Resp:  18  Temp:  36.8 C  SpO2: 99% 97%    Last Pain:  Vitals:   01/29/17 1433  TempSrc: Oral  PainSc:                  Bashar Milam S

## 2017-02-02 ENCOUNTER — Telehealth: Payer: Self-pay | Admitting: *Deleted

## 2017-02-02 NOTE — Telephone Encounter (Signed)
Received order for oncotype testing. Requisition sent to pathology. Received by Keisha 

## 2017-02-08 ENCOUNTER — Telehealth: Payer: Self-pay

## 2017-02-08 NOTE — Telephone Encounter (Signed)
Returned pt's call.  No answer, no vm

## 2017-02-09 ENCOUNTER — Telehealth: Payer: Self-pay

## 2017-02-09 NOTE — Telephone Encounter (Signed)
Attempted to return pt call from 9/4.  No answer, no vm to leave msg.

## 2017-02-16 ENCOUNTER — Encounter: Payer: Self-pay | Admitting: Radiation Oncology

## 2017-02-16 NOTE — Progress Notes (Signed)
Location of Breast Cancer: upper-inner quadrant of right breast   Histology per Pathology Report:   01/28/17 Diagnosis 1. 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 - LOBULAR CARCINOMA IN SITU. - THE SURGICAL RESECTION MARGINS ARE NEGATIVE FOR INVASIVE CARCINOMA. - SEE ONCOLOGY TABLE BELOW. 2. Lymph node, sentinel, biopsy, Right axillary #1 - THERE IS NO EVIDENCE OF CARCINOMA IN 1 OF 1 LYMPH NODE (0/1). - SEE COMMENT. 3. Lymph node, sentinel, biopsy, Right axillary #2 - THERE IS NO EVIDENCE OF CARCINOMA IN 1 OF 1 LYMPH NODE (0/1). - SEE COMMENT. 4. Lymph node, sentinel, biopsy, Right axillary #3 - THERE IS NO EVIDENCE OF CARCINOMA IN 1 OF 1 LYMPH NODE (0/1). - SEE COMMENT. 5. Lymph node, sentinel, biopsy, Right axillary #4 - THERE IS NO EVIDENCE OF CARCINOMA IN 1 OF 1 LYMPH NODE (0/1). - SEE COMMENT.  12/13/16 Diagnosis Breast, right, needle core biopsy, outer breast at anterior to middle depth - INVASIVE MAMMARY CARCINOMA, SEE COMMENT. - COMPLEX SCLEROSING LESION WITH FIBROCYSTIC CHANGE, USUAL DUCTAL HYPERPLASIA, AND FIBROADENOMATOID CHANGE.  Receptor Status: ER(90%), PR (0%), Her2-neu (neg), Ki-(5%)  Did patient present with symptoms (if so, please note symptoms) or was this found on screening mammography?: She tells me that her PCP found it several years ago, but diagnostic mammograms were negative and showed dense breast tissue. Her PCP sent her for an ultrasound during her recent yearly check.   Past/Anticipated interventions by surgeon, if any: 01/28/17 -Procedure: RIGHT TOTAL MASTECTOMY WITH SENTINEL LYMPH NODE BIOPSY;  Surgeon: Excell Seltzer, MD and Procedure: RIGHT BREAST RECONSTRUCTION WITH PLACEMENT OF TISSUE EXPANDER AND ALLODERM;  Surgeon: [name], MD  Past/Anticipated interventions by medical oncology, if any: anastrozole started 11/24/2016 and stopped about a month ago and has currently been taking  Letrozole for about a week . Oncotype ordered 02/02/17.  Lymphedema issues, if any: She denies swelling to her arms. She does report swelling to her lateral right breast.  Pain issues, if any:  She has pain at her surgery site which she rates a 2/10 today. The pain increases toward the end of the day.   SAFETY ISSUES:  Prior radiation? No  Pacemaker/ICD? No  Possible current pregnancy? no  Is the patient on methotrexate? No  Current Complaints / other details:   BP 102/66   Pulse 74   Temp 98.2 F (36.8 C)   Ht 5' 0.5" (1.537 m)   Wt 141 lb 3.2 oz (64 kg)   SpO2 100% Comment: room air  BMI 27.12 kg/m     Jacqulyn Liner, RN 02/16/2017,8:58 AM

## 2017-02-17 ENCOUNTER — Telehealth: Payer: Self-pay | Admitting: *Deleted

## 2017-02-17 ENCOUNTER — Other Ambulatory Visit: Payer: Self-pay | Admitting: *Deleted

## 2017-02-17 ENCOUNTER — Encounter: Payer: Self-pay | Admitting: Oncology

## 2017-02-17 DIAGNOSIS — Z170 Estrogen receptor positive status [ER+]: Principal | ICD-10-CM

## 2017-02-17 DIAGNOSIS — C50211 Malignant neoplasm of upper-inner quadrant of right female breast: Secondary | ICD-10-CM

## 2017-02-17 MED ORDER — LETROZOLE 2.5 MG PO TABS: 2.5000 mg | ORAL_TABLET | Freq: Every day | ORAL | 3 refills | Status: DC

## 2017-02-17 NOTE — Telephone Encounter (Signed)
Received oncotype results of 21.  Patient aware of results. Copy to scan.

## 2017-02-17 NOTE — Telephone Encounter (Signed)
Received call from patient stating her swelling and pain in her feet has gotten better since being off the anastrozole.  Informed her I would call in Femara for her to try.  She will let us know how this works for her.

## 2017-02-18 ENCOUNTER — Encounter (HOSPITAL_COMMUNITY): Payer: Self-pay

## 2017-02-24 ENCOUNTER — Ambulatory Visit
Admission: RE | Admit: 2017-02-24 | Discharge: 2017-02-24 | Disposition: A | Discharge status: Home / Self Care | Payer: Medicare Other | Source: Ambulatory Visit | Attending: Radiation Oncology | Admitting: Radiation Oncology

## 2017-02-24 ENCOUNTER — Encounter: Payer: Self-pay | Admitting: Radiation Oncology

## 2017-02-24 DIAGNOSIS — C50211 Malignant neoplasm of upper-inner quadrant of right female breast: Secondary | ICD-10-CM

## 2017-02-24 DIAGNOSIS — Z170 Estrogen receptor positive status [ER+]: Secondary | ICD-10-CM | POA: Insufficient documentation

## 2017-02-24 NOTE — Progress Notes (Signed)
Please see the Nurse Progress Note in the MD Initial Consult Encounter for this patient. 

## 2017-02-24 NOTE — Progress Notes (Signed)
Radiation Oncology         [phone] ________________________________  Re-evaluation Outpatient Note   Name: [name] MRN: [id number]  Date: 02/24/2017  DOB: Feb 08, 1941  CC:Gabriella Ruff, MD  Excell Seltzer, MD   REFERRING PHYSICIAN: Excell Seltzer, MD  DIAGNOSIS: 76 y.o. 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   HISTORY OF PRESENT ILLNESS::[name] is a 76 y.o. female who is  presented with a palpable right breast mass one year ago. Mammogram and ultrasound at that time found fibrocystic changes. She was recommended for repeat diagnostic mammogram in one year. Annual repeat diagnostic mammogram and ultrasound were performed on 11/11/2016 revealing a 3.5 cm irregular solid mass in the right breast consistent with carcinoma and highly suggestive of malignancy.  Biopsy of the right breast at the 1 o'clock position was performed on 11/15/2016. This revealed invasive and in situ mammary carcinoma, grade 1-2, ER 90%/ PR 2%/ HER-2 negative/ Ki-67 5%. The malignant cells are negative for E-cadherin, supporting a lobular phenotype.   Since her initial revaluation the breast clinic, she is undergone her definitve surgery by Dr. Excell Seltzer on 01/28/17. Patient underwent a right simple mastectomy and sentinel node procedure. On pathologic reveiw she was found to have 7.8cm tumor, invasive lobular, the surgical margins were clear, all grader than 0.2 cm, patient had four sentinel lymph node, none of which were metatastaic diease. Patient did undergo immediate reconstruction by Dr. Iran Planas, patient had a right breast reconstruction with a tissue expander as well as placement acellular dermis, (alloderm).  Patient has been doing well since surgery, other than staph infection she is currently undergoing, she is taking Cipro and will  continue course on antibiotic for ten days.  Patient is now being seen for consideration for postmastectomy irradiation.   Histology per Pathology Report:  01/28/17 Diagnosis 1. 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 - LOBULAR CARCINOMA IN SITU. - THE SURGICAL RESECTION MARGINS ARE NEGATIVE FOR INVASIVE CARCINOMA. - SEE ONCOLOGY TABLE BELOW. 2. Lymph node, sentinel, biopsy, Right axillary #1 - THERE IS NO EVIDENCE OF CARCINOMA IN 1 OF 1 LYMPH NODE (0/1). - SEE COMMENT. 3. Lymph node, sentinel, biopsy, Right axillary #2 - THERE IS NO EVIDENCE OF CARCINOMA IN 1 OF 1 LYMPH NODE (0/1). - SEE COMMENT. 4. Lymph node, sentinel, biopsy, Right axillary #3 - THERE IS NO EVIDENCE OF CARCINOMA IN 1 OF 1 LYMPH NODE (0/1). - SEE COMMENT. 5. Lymph node, sentinel, biopsy, Right axillary #4 - THERE IS NO EVIDENCE OF CARCINOMA IN 1 OF 1 LYMPH NODE (0/1). - SEE COMMENT.  12/13/16 Diagnosis Breast, right, needle core biopsy, outer breast at anterior to middle depth - INVASIVE MAMMARY CARCINOMA, SEE COMMENT. - COMPLEX SCLEROSING LESION WITH FIBROCYSTIC CHANGE, USUAL DUCTAL HYPERPLASIA, AND FIBROADENOMATOID CHANGE.  PREVIOUS RADIATION THERAPY: No  PAST MEDICAL HISTORY:  has a past medical history of Anemia; Anxiety; Arthritis; Asthma; Breast cancer ([place]); Cancer ([place]); Complication of anesthesia; Concussion; Dislocated elbow (10/12/2016); Dysrhythmia (12/2016); Family history of breast cancer; Hypertension; Hypertensive heart disease without CHF; Kidney stones; Melanoma ([place]); Persistent atrial fibrillation ([place]) (12/16/2016); and Pneumonia.    PAST SURGICAL HISTORY: Past Surgical History:  Procedure Laterality Date  . brachial skin removal due to deforimity Bilateral   . BREAST RECONSTRUCTION WITH PLACEMENT OF TISSUE EXPANDER AND FLEX HD (ACELLULAR HYDRATED DERMIS) Right 01/28/2017  Procedure: RIGHT BREAST RECONSTRUCTION WITH PLACEMENT OF TISSUE  EXPANDER AND ALLODERM;  Surgeon: [name], MD;  Location: [facility];  Service: Plastics;  Laterality: Right;  . BUNIONECTOMY     left   . CATARACT EXTRACTION     bilaterla cataract surgery   . COLONOSCOPY W/ POLYPECTOMY    . KNEE ARTHROSCOPY     left   . MASTECTOMY W/ SENTINEL NODE BIOPSY Right 01/28/2017   Procedure: RIGHT TOTAL MASTECTOMY WITH SENTINEL LYMPH NODE BIOPSY;  Surgeon: Excell Seltzer, MD;  Location: [facility];  Service: General;  Laterality: Right;  . MELANOMA EXCISION Left   . polyp removed from uterus     . right wrist pinning     . skin cancers removed    . TOTAL KNEE ARTHROPLASTY Right 01/29/2013   Procedure: RIGHT TOTAL KNEE ARTHROPLASTY;  Surgeon: Gearlean Alf, MD;  Location[facility];  Service: Orthopedics;  Laterality: Right;    FAMILY HISTORY: family history includes Breast cancer in her other; COPD in her father.  SOCIAL HISTORY:  reports that she has never smoked. She has never used smokeless tobacco. She reports that she drinks alcohol. She reports that she does not use drugs.  ALLERGIES: Chlorhexidine gluconate and Codeine  MEDICATIONS:  Current Outpatient Prescriptions  Medication Sig Dispense Refill  . ALPRAZolam (XANAX) 0.5 MG tablet Take 0.5-1 mg by mouth daily as needed for anxiety.    . Ascorbic Acid (VITAMIN C PO) Take 1,000 mg by mouth 2 (two) times daily.    . B Complex-C (B-COMPLEX WITH VITAMIN C) tablet Take 1 tablet by mouth daily.    Marland Kitchen BREO ELLIPTA 100-25 MCG/INH AEPB Inhale 1 puff into the lungs daily.    . cholecalciferol (VITAMIN D) 1000 units tablet Take 1,000 Units by mouth daily.    . ciprofloxacin (CIPRO) 500 MG tablet Take 500 mg by mouth.    . desonide (DESOWEN) 0.05 % cream Apply 1 application topically 2 (two) times daily as needed (for skin irritation.).    Marland Kitchen FOLIC ACID PO Take 1 tablet by mouth daily.    Marland Kitchen letrozole (FEMARA) 2.5 MG tablet Take 1 tablet (2.5 mg total) by mouth daily. 30 tablet 3  . levocetirizine (XYZAL) 5 MG  tablet Take 5 mg by mouth at bedtime.     . methocarbamol (ROBAXIN) 500 MG tablet Take 1 tablet (500 mg total) by mouth every 8 (eight) hours as needed for muscle spasms. 30 tablet 0  . MILK THISTLE PO Take 1 capsule by mouth 3 (three) times daily.    . NON FORMULARY Take 1 capsule by mouth daily. Natto-K    . omega-3 acid ethyl esters (LOVAZA) 1 g capsule Take 1 g by mouth daily.    Marland Kitchen oxyCODONE (OXY IR/ROXICODONE) 5 MG immediate release tablet Take 1 tablet (5 mg total) by mouth every 4 (four) hours as needed for moderate pain. 30 tablet 0  . Potassium 99 MG TABS Take 99 mg by mouth daily.     . Probiotic Product (PROBIOTIC PO) Take 1 capsule by mouth daily as needed (stomach).    . propranolol ER (INDERAL LA) 80 MG 24 hr capsule Take 80 mg by mouth daily.    . ranitidine (ZANTAC) 150 MG capsule Take 1 capsule (150 mg total) by mouth daily. 30 capsule 0  . Red Yeast Rice Extract POWD Take 1 tablet by mouth daily.    . RESTASIS 0.05 % ophthalmic emulsion Place 1 drop into both eyes daily.     Marland Kitchen  rivaroxaban (XARELTO) 20 MG TABS tablet Take 1 tablet (20 mg total) by mouth daily with supper. May resume this starting February 02, 2017  0  . tetrahydrozoline-zinc (VISINE-AC) 0.05-0.25 % ophthalmic solution Place 2 drops into both eyes 3 (three) times daily as needed (for allergy eyes/dry eye).    . traMADol (ULTRAM) 50 MG tablet Take 50 mg by mouth every 6 (six) hours as needed. for pain  0  . triamterene-hydrochlorothiazide (MAXZIDE-25) 37.5-25 MG per tablet Take 1 tablet by mouth every morning.     . TURMERIC PO Take 1 capsule by mouth daily.    Marland Kitchen UBIQUINOL PO Take 1 tablet by mouth daily.    Marland Kitchen albuterol (PROVENTIL HFA;VENTOLIN HFA) 108 (90 Base) MCG/ACT inhaler Inhale 2 puffs into the lungs every 6 (six) hours as needed. For wheezing/shortness of breath    . anastrozole (ARIMIDEX) 1 MG tablet Take 1 tablet (1 mg total) by mouth daily. (Patient not taking: Reported on 01/29/2017) 90 tablet 4   No  current facility-administered medications for this encounter.     REVIEW OF SYSTEMS:  A 15 point review of systems is documented in the electronic medical record. This was obtained by the nursing staff. However, I reviewed this with the patient to discuss relevant findings and make appropriate changes.  Pertinent items are noted in HPI.   PHYSICAL EXAM:  height is 5' 0.5" (1.537 m) and weight is 141 lb 3.2 oz (64 kg). Her temperature is 98.2 F (36.8 C). Her blood pressure is 102/66 and her pulse is 74. Her oxygen saturation is 100%.     Lungs are clear to auscultation bilaterally. Heart has regular rate and rhythm. No palpable cervical, supraclavicular, or axillary adenopathy. Abdomen soft, non-tender, normal bowel sounds.  Left breast no palpable mass, or nipple discharge.  Right chest area shows a mastectomy scar with a tissue expander in place. Some erythema but no obvious signs of infection.   LABORATORY DATA:  Lab Results  Component Value Date   WBC 10.7 (H) 01/29/2017   HGB 12.1 01/29/2017   HCT 34.8 (L) 01/29/2017   MCV 91.8 01/29/2017   PLT 282 01/29/2017   NEUTROABS 8.0 (H) 01/29/2017   Lab Results  Component Value Date   NA 122 (L) 01/29/2017   K 3.4 (L) 01/29/2017   CL 89 (L) 01/29/2017   CO2 22 01/29/2017   GLUCOSE 112 (H) 01/29/2017   CREATININE 1.00 01/29/2017   CALCIUM 9.0 01/29/2017      RADIOGRAPHY: Dg Chest 2 View  Result Date: 01/29/2017 CLINICAL DATA:  Chest tightness EXAM: CHEST  2 VIEW COMPARISON:  12/14/2016 FINDINGS: Cardiac shadow is within normal limits. The lungs are well aerated bilaterally. No focal infiltrate or sizable effusion is seen. Breast expander is noted on the right with a surgical drain in place. No bony abnormality is noted. IMPRESSION: Postsurgical changes without acute abnormality. Electronically Signed   By: Alcide Clever M.D.   On: 01/29/2017 20:56    IMPRESSION:  Invasive lobular carcinoma, pT3, pN0, pMX. Patient would be at risk  for reoccurance along the right chest area due to her large tumor size (7.8cm) and would likely benefit from post mastectomy radiation. I discussed the course of radtaion therapy and possible side effects of this treatment, and potential long-term toxicities,  which she understands and wishes to proceed with treatment.  PLAN: Patient is scheduled for a planning session appointment October the 9th, 2018 at 2 pm with treatment to being the week of  October 15th. I anticapte 5 and 1/2 weeks of radiation therapy. Start time may be postponed depending on reccommendation of Dr. Iran Planas.   ------------------------------------------------  Blair Promise, PhD, MD   This document serves as a record of services personally performed by Gery Pray MD. It was created on his behalf by Delton Coombes, a trained medical scribe. The creation of this record is based on the scribe's personal observations and the provider's statements to them. This document has been checked and approved by the attending provider.

## 2017-03-02 ENCOUNTER — Ambulatory Visit: Payer: Medicare Other | Admitting: Physical Therapy

## 2017-03-03 ENCOUNTER — Encounter: Payer: Self-pay | Admitting: Physical Therapy

## 2017-03-03 ENCOUNTER — Ambulatory Visit: Payer: Medicare Other | Attending: Plastic Surgery | Admitting: Physical Therapy

## 2017-03-03 DIAGNOSIS — R293 Abnormal posture: Secondary | ICD-10-CM | POA: Insufficient documentation

## 2017-03-03 DIAGNOSIS — M6281 Muscle weakness (generalized): Secondary | ICD-10-CM

## 2017-03-03 DIAGNOSIS — M25611 Stiffness of right shoulder, not elsewhere classified: Secondary | ICD-10-CM | POA: Diagnosis present

## 2017-03-03 DIAGNOSIS — R600 Localized edema: Secondary | ICD-10-CM | POA: Diagnosis present

## 2017-03-03 DIAGNOSIS — M25511 Pain in right shoulder: Secondary | ICD-10-CM

## 2017-03-03 NOTE — Therapy (Signed)
[place], [place], [address] Phone: [phone]   Fax:  [phone]  Physical Therapy Evaluation  Patient Details  Name: [name] MRN: [id number] Date of Birth: 1940-06-18 Referring Provider: Thimmappa  Encounter Date: 03/03/2017      PT End of Session - 03/03/17 1657    Visit Number 1   Number of Visits 9   Date for PT Re-Evaluation 03/31/17   PT Start Time 8938  pt arrived late   PT Stop Time 1345   PT Time Calculation (min) 33 min   Activity Tolerance Patient tolerated treatment well   Behavior During Therapy WFL for tasks assessed/performed      Past Medical History:  Diagnosis Date  . Anemia    history of  . Anxiety   . Arthritis   . Asthma   . Breast cancer ([place])   . Cancer ([place])    skin cancers, one melanoma   . Complication of anesthesia    patient woke up during colonoscopy  . Concussion    8 yrs. ago due to being thrown from a horse  . Dislocated elbow 10/12/2016   left  . Dysrhythmia 12/2016   A-fib  . Family history of breast cancer   . Hypertension   . 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 Kidney stones    hx of   . Melanoma ([place])   . Persistent atrial fibrillation ([place]) 12/16/2016   CHA2DS2VASC score 3  . Pneumonia    hx of     Past Surgical History:  Procedure Laterality Date  . brachial skin removal due to deforimity Bilateral   . BREAST RECONSTRUCTION WITH PLACEMENT OF TISSUE EXPANDER AND FLEX HD (ACELLULAR HYDRATED DERMIS) Right 01/28/2017   Procedure: RIGHT BREAST RECONSTRUCTION WITH PLACEMENT OF TISSUE EXPANDER AND ALLODERM;  Surgeon: [name], MD;  Location: [facility];  Service: Plastics;  Laterality: Right;  . BUNIONECTOMY     left   . CATARACT EXTRACTION     bilaterla cataract surgery   . COLONOSCOPY W/ POLYPECTOMY    . KNEE ARTHROSCOPY     left   . MASTECTOMY W/ SENTINEL NODE BIOPSY Right 01/28/2017   Procedure: RIGHT TOTAL  MASTECTOMY WITH SENTINEL LYMPH NODE BIOPSY;  Surgeon: Excell Seltzer, MD;  Location: [facility];  Service: General;  Laterality: Right;  . MELANOMA EXCISION Left   . polyp removed from uterus     . right wrist pinning     . skin cancers removed    . TOTAL KNEE ARTHROPLASTY Right 01/29/2013   Procedure: RIGHT TOTAL KNEE ARTHROPLASTY;  Surgeon: Gearlean Alf, MD;  [facility];  Service: Orthopedics;  Laterality: Right;    There were no vitals filed for this visit.       Subjective Assessment - 03/03/17 1315    Subjective My right arm is painful and when you move it, it is very uncomfortable and hurts. I have swelling on my side. My scar feels tight. I have started reconstruction so there is a filler in it but I wont be able to complete that process until the radiation is completed. I am having trouble opening things. I have a constant pain.    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 It measures 3.5 cm and is located in the upper inner quadrant. It is ER/PR positive and HER2 negative. She had a traumatic fall 10/12/16 which resulted in  a left elbow dislocation, She had a right total knee replacement in 2014 and has a left total knee replacement scheduled for 01/10/17., new onset of A-fib, underwent R mastectomy on 01/28/17, and SLNB all clear, pt has to complete radiation   Patient Stated Goals to be able to move my arm without any pain   Currently in Pain? Yes   Pain Score 7    Pain Location Axilla   Pain Orientation Right   Pain Descriptors / Indicators Dull;Aching   Pain Type Surgical pain   Pain Radiating Towards towards inner breast   Pain Onset More than a month ago   Pain Frequency Constant   Aggravating Factors  the weather   Pain Relieving Factors pain medicine   Effect of Pain on Daily Activities has not been as active since the surgery            [facility] PT Assessment - 03/03/17 0001      Assessment    Medical Diagnosis right breast cancer   Referring Provider Thimmappa   Onset Date/Surgical Date 01/28/17   Hand Dominance Right   Prior Therapy none     Precautions   Precautions Other (comment)   Precaution Comments at risk for lymphedema     Restrictions   Weight Bearing Restrictions No     Balance Screen   Has the patient fallen in the past 6 months Yes   How many times? 1   Has the patient had a decrease in activity level because of a fear of falling?  Yes  scheduled to have L knee replacement in Aug but delayed   Is the patient reluctant to leave their home because of a fear of falling?  Yes     [place] residence   Living Arrangements Spouse/significant other   Available Help at Discharge Family   Type of [place] to enter   Entrance Stairs-Number of Steps 1   [place] One level   [place] - single point;[place] - 2 wheels     Prior Function   Level of Independence Independent   Vocation Retired   Leisure prior to surgery she did yoga, works out with a Clinical research associate 2x/wk, and exercises at the [employer]   Overall Cognitive Status Within Functional Limits for tasks assessed     Observation/Other Assessments   Focus on Therapeutic Outcomes (FOTO)  --   Other Surveys  --  LLIS: 66%     Observation/Other Assessments-Edema    Edema --  increased puffiness at right axilla     Posture/Postural Control   Posture/Postural Control Postural limitations   Postural Limitations Rounded Shoulders;Forward head     ROM / Strength   AROM / PROM / Strength AROM     AROM   AROM Assessment Site Shoulder   Right/Left Shoulder Right;Left   Right Shoulder Flexion 162 Degrees  with pt reporting tightness   Right Shoulder ABduction 150 Degrees   Right Shoulder Internal Rotation 32 Degrees   Right Shoulder External Rotation 90 Degrees   Left Shoulder Flexion 159 Degrees   Left Shoulder ABduction 168  Degrees   Left Shoulder Internal Rotation 41 Degrees   Left Shoulder External Rotation 90 Degrees     Strength   Overall Strength --     Transfers   Transfers --     Ambulation/Gait   Ambulation/Gait --  LYMPHEDEMA/ONCOLOGY QUESTIONNAIRE - 03/03/17 1340      Right Upper Extremity Lymphedema   15 cm Proximal to Olecranon Process 32 cm   Olecranon Process 26 cm   15 cm Proximal to Ulnar Styloid Process 23.2 cm   Just Proximal to Ulnar Styloid Process 16 cm   Across Hand at [employer] 17.2 cm   At [place] of 2nd Digit 5.9 cm     Left Upper Extremity Lymphedema   15 cm Proximal to Olecranon Process 33.5 cm   Olecranon Process 28 cm   15 cm Proximal to Ulnar Styloid Process 24 cm   Just Proximal to Ulnar Styloid Process 16.2 cm   Across Hand at [employer] 17 cm   At [place] of 2nd Digit 6 cm           Quick Dash - 03/03/17 0001    Open a tight or new jar Moderate difficulty   Do heavy household chores (wash walls, wash floors) Moderate difficulty   Carry a shopping bag or briefcase No difficulty   Wash your back Unable   Use a knife to cut food No difficulty   Recreational activities in which you take some force or impact through your arm, shoulder, or hand (golf, hammering, tennis) Severe difficulty   During the past week, to what extent has your arm, shoulder or hand problem interfered with your normal social activities with family, friends, neighbors, or groups? Slightly   During the past week, to what extent has your arm, shoulder or hand problem limited your work or other regular daily activities Modererately   Arm, shoulder, or hand pain. Severe   Tingling (pins and needles) in your arm, shoulder, or hand None   Difficulty Sleeping Moderate difficulty   DASH Score 43.18 %      Objective measurements completed on examination: See above findings.                  PT Education - 03/03/17 1704    Education provided Yes   Education  Details anatomy and physiology of lymphatic system   Person(s) Educated Patient;Spouse   Methods Explanation   Comprehension Verbalized understanding              [facility] Goals - 03/03/17 1700      CC Long Term Goal  #1   Title Pt to be able to independently verbalize lymphedema risk reduction practices   Time 4   Period Weeks   Status New   Target Date 03/31/17     CC Long Term Goal  #2   Title Pt to be independent in a home exercise program for continued strengthening and stretching   Time 4   Period Weeks   Status New   Target Date 03/31/17     CC Long Term Goal  #3   Title Pt to demonstrate 160 degrees of right shoulder abduction to allow her to reach items out to sides   Baseline 150   Time 4   Period Weeks   Status New   Target Date 03/31/17     CC Long Term Goal  #4   Title Pt to report a 75% decrease in pain in right UE with RUE movements to allow pt to return to prior level of function   Time 4   Period Weeks   Status New   Target Date 03/31/17     CC Long Term Goal  #5   Title Pt  to report a 75% improvement in edema in right lateral trunk to allow improved comfort   Time 4   Period Weeks   Status New             Plan - 03/03/17 1351    Clinical Impression Statement Pt underwent a right mastectomy and SLNB on 01/28/17 for treatment of right breast cancer. She now presents with right truncal swelling and decreased right shoulder ROM and weakness. She was exercising prior to her surgery and going to exercise classes and had a Physiological scientist. She is awaiting a left knee replacement that she was supposed to undergo in August 2018 but will have to postpone due to cancer treatments. She will begin radiation in mid October 2018 and does not require chemotherapy. Pt would benefit from skilled PT services to incresae R shoulder ROM and strength, decrease pain, and decrease right truncal edema.    History and Personal Factors relevant to plan of  care: new onset of A-fib, fall risk, left elbow dislocationd ue to fall in May 2018, needs L TKA   Clinical Presentation Evolving   Clinical Presentation due to: will begin radiation   Clinical Decision Making Moderate   Rehab Potential Good   Clinical Impairments Affecting Rehab Potential pt to begin radiation   PT Frequency 2x / week   PT Duration 4 weeks   PT Treatment/Interventions Patient/family education;Therapeutic exercise;ADLs/Self Care Home Management;Therapeutic activities;Manual lymph drainage;Manual techniques;Compression bandaging;Scar mobilization;Passive range of motion;Taping;Orthotic Fit/Training   PT Next Visit Plan made chip pack for pt, issue lymphedema risk reduction practices and educate pt, begin MLD to right trunk and gentle AA/A/PROM to R shoulder   Consulted and Agree with Plan of Care Patient;Family member/caregiver   Family Member Consulted Husband      Patient will benefit from skilled therapeutic intervention in order to improve the following deficits and impairments:  Pain, Increased fascial restricitons, Decreased scar mobility, Postural dysfunction, Impaired UE functional use, Decreased strength, Decreased range of motion, Increased edema, Decreased knowledge of precautions  Visit Diagnosis: Acute pain of right shoulder - Plan: PT plan of care cert/re-cert  Stiffness of right shoulder, not elsewhere classified - Plan: PT plan of care cert/re-cert  Muscle weakness (generalized) - Plan: PT plan of care cert/re-cert  Localized edema - Plan: PT plan of care cert/re-cert  Abnormal posture - Plan: PT plan of care cert/re-cert      G-Codes - 04/54/09 1704    Functional Assessment Tool Used (Outpatient Only) Quick Dash   Functional Limitation Carrying, moving and handling objects   Carrying, Moving and Handling Objects Current Status (W1191) At least 40 percent but less than 60 percent impaired, limited or restricted   Carrying, Moving and Handling Objects  Goal Status (Y7829) At least 1 percent but less than 20 percent impaired, limited or restricted       Problem List Patient Active Problem List   Diagnosis Date Noted  . Genetic testing 12/24/2016  . Persistent atrial fibrillation ([place]) 12/16/2016  . Long term current use of anticoagulant therapy 12/16/2016  . 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 Family history of breast cancer   . Melanoma ([place])   . Malignant neoplasm of upper-inner quadrant of right breast in female, estrogen receptor positive ([place]) 11/19/2016  . OA (osteoarthritis) of knee 01/29/2013    Allyson Sabal [facility] 03/03/2017, 5:06 PM  [place], [place], [address] Phone: [phone]   Fax:  [phone]  Name: [name] MRN: [id number] Date of Birth: May 07, 1941  Manus Gunning, PT 03/03/17 5:06 PM

## 2017-03-09 ENCOUNTER — Ambulatory Visit: Payer: Medicare Other | Attending: Plastic Surgery

## 2017-03-09 DIAGNOSIS — M6281 Muscle weakness (generalized): Secondary | ICD-10-CM | POA: Insufficient documentation

## 2017-03-09 DIAGNOSIS — M25611 Stiffness of right shoulder, not elsewhere classified: Secondary | ICD-10-CM

## 2017-03-09 DIAGNOSIS — R600 Localized edema: Secondary | ICD-10-CM | POA: Diagnosis present

## 2017-03-09 DIAGNOSIS — M25511 Pain in right shoulder: Secondary | ICD-10-CM | POA: Diagnosis present

## 2017-03-09 DIAGNOSIS — R293 Abnormal posture: Secondary | ICD-10-CM | POA: Diagnosis present

## 2017-03-09 NOTE — Therapy (Signed)
[facility] [address] [place], [place], [address] Phone: [phone]   Fax:  [phone]  Physical Therapy Treatment  Patient Details  Name: [name] MRN: [id number] Date of Birth: 06-13-1940 Referring Provider: [place]  Encounter Date: 03/09/2017      PT End of Session - 03/09/17 0935    Visit Number 2   Number of Visits 9   Date for PT Re-Evaluation 03/31/17   PT Start Time 0848   PT Stop Time 0935   PT Time Calculation (min) 47 min   Activity Tolerance Patient tolerated treatment well   Behavior During Therapy [facility] for tasks assessed/performed      Past Medical History:  Diagnosis Date  . Anemia    history of  . Anxiety   . Arthritis   . Asthma   . Breast cancer (HCC)   . Cancer (HCC)    skin cancers, one melanoma   . Complication of anesthesia    patient woke up during colonoscopy  . Concussion    8 yrs. ago due to being thrown from a horse  . Dislocated elbow 10/12/2016   left  . Dysrhythmia 12/2016   A-fib  . Family history of breast cancer   . Hypertension   . 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 Kidney stones    hx of   . Melanoma (HCC)   . Persistent atrial fibrillation (HCC) 12/16/2016   CHA2DS2VASC score 3  . Pneumonia    hx of     Past Surgical History:  Procedure Laterality Date  . brachial skin removal due to deforimity Bilateral   . BREAST RECONSTRUCTION WITH PLACEMENT OF TISSUE EXPANDER AND FLEX HD (ACELLULAR HYDRATED DERMIS) Right 01/28/2017   Procedure: RIGHT BREAST RECONSTRUCTION WITH PLACEMENT OF TISSUE EXPANDER AND ALLODERM;  Surgeon: Glenna Fellows, MD;  Location: [facility];  Service: Plastics;  Laterality: Right;  . BUNIONECTOMY     left   . CATARACT EXTRACTION     bilaterla cataract surgery   . COLONOSCOPY W/ POLYPECTOMY    . KNEE ARTHROSCOPY     left   . MASTECTOMY W/ SENTINEL NODE BIOPSY Right 01/28/2017   Procedure: RIGHT TOTAL MASTECTOMY WITH SENTINEL  LYMPH NODE BIOPSY;  Surgeon: Glenna Fellows, MD;  Location: [facility] OR;  Service: General;  Laterality: Right;  . MELANOMA EXCISION Left   . polyp removed from uterus     . right wrist pinning     . skin cancers removed    . TOTAL KNEE ARTHROPLASTY Right 01/29/2013   Procedure: RIGHT TOTAL KNEE ARTHROPLASTY;  Surgeon: Loanne Drilling, MD;  Location: [facility];  Service: Orthopedics;  Laterality: Right;    There were no vitals filed for this visit.      Subjective Assessment - 03/09/17 0852    Subjective My Rt shoulder doesn't hurt much when it's at rest at my side, but when I move it hurts. I also have a spot under my breast that can gets me every now and then.    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 It measures 3.5 cm and is located in the upper inner quadrant. It is ER/PR positive and HER2 negative. She had a traumatic fall 10/12/16 which resulted in a left elbow dislocation, She had a right total knee replacement in 2014 and has a left total knee replacement scheduled for 01/10/17., new onset of A-fib, underwent R mastectomy on 01/28/17,  and SLNB all clear, pt has to complete radiation   Patient Stated Goals to be able to move my arm without any pain   Currently in Pain? Yes   Pain Score 7    Pain Location Shoulder   Pain Orientation Right   Pain Descriptors / Indicators Sharp;Aching   Pain Type Surgical pain   Pain Onset More than a month ago   Pain Frequency Constant   Aggravating Factors  moving arm   Pain Relieving Factors pain meds                         [facility] Adult PT Treatment/Exercise - 03/09/17 0001      Manual Therapy   Manual Therapy Manual Lymphatic Drainage (MLD);Myofascial release;Passive ROM   Manual therapy comments Issued 1/4" gray foam in thin stockinette for pt to wear in bra near axilla   Myofascial Release Gentle UE pulling during P/ROM   Manual Lymphatic Drainage (MLD) In  Supine: Short neck, superficial and deep abdominals, Lt axilla and Rt inguinal nodes, anterior inter-axillary and Rt axillo-inguinal anastomosis then focused on Rt lateral trunk and upper arm.   Passive ROM In Supine: Into flexion, abduction and er to pts tolerance                        Long Term Clinic Goals - 03/03/17 1700      CC Long Term Goal  #1   Title Pt to be able to independently verbalize lymphedema risk reduction practices   Time 4   Period Weeks   Status New   Target Date 03/31/17     CC Long Term Goal  #2   Title Pt to be independent in a home exercise program for continued strengthening and stretching   Time 4   Period Weeks   Status New   Target Date 03/31/17     CC Long Term Goal  #3   Title Pt to demonstrate 160 degrees of right shoulder abduction to allow her to reach items out to sides   Baseline 150   Time 4   Period Weeks   Status New   Target Date 03/31/17     CC Long Term Goal  #4   Title Pt to report a 75% decrease in pain in right UE with RUE movements to allow pt to return to prior level of function   Time 4   Period Weeks   Status New   Target Date 03/31/17     CC Long Term Goal  #5   Title Pt to report a 75% improvement in edema in right lateral trunk to allow improved comfort   Time 4   Period Weeks   Status New            Plan - 03/09/17 0935    Clinical Impression Statement Pt toelrated first session of manual therapy well reporting feeling better after session in shoulder, not much change with swelling noted yet though. Issued gray compression foam for pt to wear in her bra which she reported feeling immediate relief with.    Rehab Potential Good   Clinical Impairments Affecting Rehab Potential pt to begin radiation   PT Frequency 2x / week   PT Duration 4 weeks   PT Treatment/Interventions Patient/family education;Therapeutic exercise;ADLs/Self Care Home Management;Therapeutic activities;Manual lymph  drainage;Manual techniques;Compression bandaging;Scar mobilization;Passive range of motion;Taping;Orthotic Fit/Training   PT Next Visit Plan Assess foam in bra;  add dowel stretches to HEP; issue lymphedema risk reduction practices and educate pt, begin MLD to right trunk and gentle AA/A/PROM to R shoulder   Consulted and Agree with Plan of Care Patient      Patient will benefit from skilled therapeutic intervention in order to improve the following deficits and impairments:  Pain, Increased fascial restricitons, Decreased scar mobility, Postural dysfunction, Impaired UE functional use, Decreased strength, Decreased range of motion, Increased edema, Decreased knowledge of precautions  Visit Diagnosis: Acute pain of right shoulder  Stiffness of right shoulder, not elsewhere classified     Problem List Patient Active Problem List   Diagnosis Date Noted  . Genetic testing 12/24/2016  . Persistent atrial fibrillation ([place]) 12/16/2016  . Long term current use of anticoagulant therapy 12/16/2016  . 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 Family history of breast cancer   . Melanoma ([place])   . Malignant neoplasm of upper-inner quadrant of right breast in female, estrogen receptor positive ([place]) 11/19/2016  . OA (osteoarthritis) of knee 01/29/2013    Otelia Limes, PTA 03/09/2017, 9:38 AM  [facility] [place], [place], [address] Phone: [phone]   Fax:  [phone]  Name: [name] MRN: [id number] Date of Birth: [birth year]

## 2017-03-10 ENCOUNTER — Telehealth: Payer: Self-pay | Admitting: Oncology

## 2017-03-10 NOTE — Telephone Encounter (Signed)
Called patient regarding questions about her treatment. 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  Advised her to let the therapists know at her CT [facility] appointment on 10/9 about her husband's surgery and that it should be OK to miss treatment for 1 day on 04/12/17.  The missed treatment will be added to the end.  She also asked about side effects.  Discussed common side effects including fatigue and skin irritation and ways to manage them.  She verbalized agreement and understanding.

## 2017-03-14 ENCOUNTER — Telehealth: Payer: Self-pay | Admitting: Oncology

## 2017-03-14 ENCOUNTER — Ambulatory Visit (HOSPITAL_BASED_OUTPATIENT_CLINIC_OR_DEPARTMENT_OTHER): Payer: Medicare Other | Admitting: Oncology

## 2017-03-14 VITALS — BP 121/70 | HR 91 | Temp 98.1°F | Resp 17 | Ht 60.5 in | Wt 139.1 lb

## 2017-03-14 DIAGNOSIS — Z79811 Long term (current) use of aromatase inhibitors: Secondary | ICD-10-CM

## 2017-03-14 DIAGNOSIS — Z170 Estrogen receptor positive status [ER+]: Secondary | ICD-10-CM | POA: Diagnosis not present

## 2017-03-14 DIAGNOSIS — C50211 Malignant neoplasm of upper-inner quadrant of right female breast: Secondary | ICD-10-CM

## 2017-03-14 DIAGNOSIS — C50411 Malignant neoplasm of upper-outer quadrant of right female breast: Secondary | ICD-10-CM

## 2017-03-14 MED ORDER — VENLAFAXINE HCL ER 75 MG PO CP24: 75.0000 mg | ORAL_CAPSULE | Freq: Every day | ORAL | 6 refills | Status: DC

## 2017-03-14 NOTE — Progress Notes (Signed)
[name]  [phone] [phone]     ID: [name] DOB: [birth year]  MR#: [id number]  NID#:[id number]  Patient Care Team: [name], MD as PCP - General (Family Medicine) Excell Seltzer, MD as Consulting Physician (General Surgery) Magrinat, Virgie Dad, MD as Consulting Physician (Oncology) Gery Pray, MD as Consulting Physician (Radiation Oncology) Lorelle Gibbs, MD (Radiology) Gaynelle Arabian, MD as Consulting Physician (Orthopedic Surgery) Harold Hedge, Darrick Grinder, MD as Consulting Physician (Allergy and Immunology) Teena Irani, MD as Consulting Physician (Gastroenterology) Chauncey Cruel, MD OTHER MD:  CHIEF COMPLAINT: Estrogen receptor positive breast cancer  CURRENT TREATMENT:  Adjuvant radiation pending; letrozole   BREAST CANCER HISTORY: From the original intake note:  The patient has had concerns in the right breast dating back to about 3 years when her primary care physician Dr. [name] noted a change. The breast has been followed very closely but no diagnosis was established. She was followed with annual diagnostic mammography and on 11/11/2016 bilateral diagnostic mammography with tomography at [facility] found the breast density to be category C. There was a new 3.5 cm irregular spiculated mass in the right breast upper inner quadrant. Ultrasound was obtained the same day and confirmed a 3.5 cm solid mass in the right breast upper inner quadrant, with no abnormalities noted in the right axilla.  Biopsy of the right breast mass in question 11/15/2013 showed ([id number] invasive lobular carcinoma, E-cadherin negative, grade 1 or 2, estrogen receptor 90% positive, progesterone receptor 2% positive, both with strong staining intensity, with an MIB-1 of 5, and no HER-2 amplification, the signals ratio being 1.14 and the number per cell 1.20.  Her subsequent history is as detailed below  INTERVAL HISTORY: [name]  underwent right lumpectomy since her last visit here. Specifically on 01/28/2017 she had a right simple mastectomy with sentinel lymph node sampling. The final pathology ([id number]) confirmed an invasive lobular carcinoma, grade 1, measuring 7.8 cm, luckily with all 4 sentinel lymph nodes clear.  We obtained an Oncotype from this material. The recurrence score of 21 predicts a 10 year risk of recurrence outside the breast of 14%, if the patient's only systemic therapy is tamoxifen for 5 years. It also predicts no significant benefit from chemotherapy.   [name] presents to the office today and notes that she has been on letrozole and is overall tolerating it well. She states that she has had bilateral feet swelling while on letrozole, but that the same symptoms occurred while previously on anastrozole. She reports that her recent right lumpectomy went well and her pain was managed with medications. Pt states that she had a drain to her right breast surgical site for a couple weeks. Pt states that she doesn't consume a high-salt diet. Pt denies hx of DM.     REVIEW OF SYSTEMS: [name] reports that she has been exercising through [employer] and [employer]. She notes that she went to Physical Therapy once. She reports "pins and needles" sensation to her bilateral feet. She denies unusual headaches, visual changes, nausea, vomiting, or dizziness. There has been no unusual cough, phlegm production, or pleurisy. This been no change in bowel or bladder habits. She denies unexplained fatigue or unexplained weight loss, bleeding, rash, or fever. A detailed review of systems was otherwise negative.     PAST MEDICAL HISTORY: Past Medical History:  Diagnosis Date  . Anemia    history of  . Anxiety   . Arthritis   . Asthma   .  Breast cancer ([place])   . Cancer ([place])    skin cancers, one melanoma   . Complication of anesthesia    patient woke up during colonoscopy  . Concussion    8 yrs. ago due to  being thrown from a horse  . Dislocated elbow 10/12/2016   left  . Dysrhythmia 12/2016   A-fib  . Family history of breast cancer   . Hypertension   . 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 Kidney stones    hx of   . Melanoma ([place])   . Persistent atrial fibrillation ([place]) 12/16/2016   CHA2DS2VASC score 3  . Pneumonia    hx of     PAST SURGICAL HISTORY: Past Surgical History:  Procedure Laterality Date  . brachial skin removal due to deforimity Bilateral   . BREAST RECONSTRUCTION WITH PLACEMENT OF TISSUE EXPANDER AND FLEX HD (ACELLULAR HYDRATED DERMIS) Right 01/28/2017   Procedure: RIGHT BREAST RECONSTRUCTION WITH PLACEMENT OF TISSUE EXPANDER AND ALLODERM;  Surgeon: [name], MD;  Location: [facility];  Service: Plastics;  Laterality: Right;  . BUNIONECTOMY     left   . CATARACT EXTRACTION     bilaterla cataract surgery   . COLONOSCOPY W/ POLYPECTOMY    . KNEE ARTHROSCOPY     left   . MASTECTOMY W/ SENTINEL NODE BIOPSY Right 01/28/2017   Procedure: RIGHT TOTAL MASTECTOMY WITH SENTINEL LYMPH NODE BIOPSY;  Surgeon: Excell Seltzer, MD;  Location: [facility];  Service: General;  Laterality: Right;  . MELANOMA EXCISION Left   . polyp removed from uterus     . right wrist pinning     . skin cancers removed    . TOTAL KNEE ARTHROPLASTY Right 01/29/2013   Procedure: RIGHT TOTAL KNEE ARTHROPLASTY;  Surgeon: Gearlean Alf, MD;  [facility];  Service: Orthopedics;  Laterality: Right;    FAMILY HISTORY Family History  Problem Relation Age of Onset  . COPD Father   . Breast cancer Other        dx in her 34s-30s  The patient's father died at the age of 74 from complications of emphysema and alcohol abuse. The patient's mother died from heart disease at age 26. The patient was an only child. The only relative with breast cancer was a maternal great aunt was diagnosed in her 23s. There is no history of ovarian cancer in the family.  GYNECOLOGIC HISTORY:  No LMP recorded.  Patient is postmenopausal. Menarche age 52, first live birth age 40, she is [place] P1. She stopped having periods in 1994. She was using intravaginal Premarin until her breast cancer diagnosis June 2018. She used oral contraceptives for approximately one year remotely with no complications.  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 Her son from her first marriage, Sabino Snipes, lives in [place] and is an Sales promotion account executive. He has 2 children. The patient's second husband, [name], is a retired Film/video editor. He has a son in [place], who works as a Marine scientist. [name] has 5 grandchildren of his own. The patient is a Psychologist, forensic.    ADVANCED DIRECTIVES: In place. The patient tells me she has named her husband [name] and her son Sherren Mocha as healthcare part of attorney   HEALTH MAINTENANCE: Social History  Substance Use Topics  . Smoking status: Never Smoker  . Smokeless tobacco: Never Used  . Alcohol use Yes     Comment: occ     Colonoscopy:2015  PAP:  Bone density: 01/27/2015 [facility]  physicians T score -0.3   Allergies  Allergen Reactions  . Chlorhexidine Gluconate Hives, Swelling and Rash    SWELLING REACTION UNSPECIFIED    "ChloraPrep "  . Codeine Nausea Only    Current Outpatient Prescriptions  Medication Sig Dispense Refill  . albuterol (PROVENTIL HFA;VENTOLIN HFA) 108 (90 Base) MCG/ACT inhaler Inhale 2 puffs into the lungs every 6 (six) hours as needed. For wheezing/shortness of breath    . ALPRAZolam (XANAX) 0.5 MG tablet Take 0.5-1 mg by mouth daily as needed for anxiety.    . Ascorbic Acid (VITAMIN C PO) Take 1,000 mg by mouth 2 (two) times daily.    . B Complex-C (B-COMPLEX WITH VITAMIN C) tablet Take 1 tablet by mouth daily.    Marland Kitchen BREO ELLIPTA 100-25 MCG/INH AEPB Inhale 1 puff into the lungs daily.    . cholecalciferol (VITAMIN D) 1000 units tablet Take 1,000 Units by mouth daily.    Marland Kitchen desonide (DESOWEN) 0.05 % cream Apply 1 application topically 2 (two) times  daily as needed (for skin irritation.).    Marland Kitchen letrozole (FEMARA) 2.5 MG tablet Take 1 tablet (2.5 mg total) by mouth daily. 30 tablet 3  . levocetirizine (XYZAL) 5 MG tablet Take 5 mg by mouth at bedtime.     . methocarbamol (ROBAXIN) 500 MG tablet Take 1 tablet (500 mg total) by mouth every 8 (eight) hours as needed for muscle spasms. 30 tablet 0  . MILK THISTLE PO Take 1 capsule by mouth 3 (three) times daily.    . NON FORMULARY Take 1 capsule by mouth daily. Natto-K    . omega-3 acid ethyl esters (LOVAZA) 1 g capsule Take 1 g by mouth daily.    . Potassium 99 MG TABS Take 99 mg by mouth daily.     . Probiotic Product (PROBIOTIC PO) Take 1 capsule by mouth daily as needed (stomach).    . propranolol ER (INDERAL LA) 80 MG 24 hr capsule Take 80 mg by mouth daily.    . ranitidine (ZANTAC) 150 MG capsule Take 1 capsule (150 mg total) by mouth daily. 30 capsule 0  . Red Yeast Rice Extract POWD Take 1 tablet by mouth daily.    . RESTASIS 0.05 % ophthalmic emulsion Place 1 drop into both eyes daily.     . rivaroxaban (XARELTO) 20 MG TABS tablet Take 1 tablet (20 mg total) by mouth daily with supper. May resume this starting February 02, 2017  0  . tetrahydrozoline-zinc (VISINE-AC) 0.05-0.25 % ophthalmic solution Place 2 drops into both eyes 3 (three) times daily as needed (for allergy eyes/dry eye).    Marland Kitchen triamterene-hydrochlorothiazide (MAXZIDE-25) 37.5-25 MG per tablet Take 1 tablet by mouth every morning.     . TURMERIC PO Take 1 capsule by mouth daily.    Marland Kitchen UBIQUINOL PO Take 1 tablet by mouth daily.    Marland Kitchen FOLIC ACID PO Take 1 tablet by mouth daily.    Marland Kitchen venlafaxine XR (EFFEXOR XR) 75 MG 24 hr capsule Take 1 capsule (75 mg total) by mouth daily with breakfast. 30 capsule 6   No current facility-administered medications for this visit.     OBJECTIVE: Middle-aged white woman Who appears stated age  24:   03/14/17 0927  BP: 121/70  Pulse: 91  Resp: 17  Temp: 98.1 F (36.7 C)  SpO2: 98%      Body mass index is 26.72 kg/m.   Filed Weights   03/14/17 0927  Weight: 139 lb 1.6 oz (63.1  kg)       ECOG FS:1 - Symptomatic but completely ambulatory  Sclerae unicteric, EOMs intact Oropharynx clear and moist No cervical or supraclavicular adenopathy Lungs no rales or rhonchi Heart regular rate and rhythm Abd soft, nontender, positive bowel sounds MSK no focal spinal tenderness, no upper extremity lymphedema Neuro: nonfocal, well oriented, anxious affect Breasts: The right breast is status post mastectomy with expander in place. The skin is tight and there is minimal erythema. There is no suggestion of cellulitis. The right axilla is benign. The left breast is unremarkable.  LAB RESULTS:  CMP     Component Value Date/Time   NA 122 (L) 01/29/2017 2145   NA 139 11/24/2016 0842   K 3.4 (L) 01/29/2017 2145   K 3.8 11/24/2016 0842   CL 89 (L) 01/29/2017 2145   CO2 22 01/29/2017 2145   CO2 26 11/24/2016 0842   GLUCOSE 112 (H) 01/29/2017 2145   GLUCOSE 104 11/24/2016 0842   BUN 9 01/29/2017 2145   BUN 15.4 11/24/2016 0842   CREATININE 1.00 01/29/2017 2145   CREATININE 0.8 11/24/2016 0842   CALCIUM 9.0 01/29/2017 2145   CALCIUM 9.8 11/24/2016 0842   PROT 6.1 (L) 01/29/2017 2145   PROT 6.8 11/24/2016 0842   ALBUMIN 3.4 (L) 01/29/2017 2145   ALBUMIN 3.7 11/24/2016 0842   AST 29 01/29/2017 2145   AST 22 11/24/2016 0842   ALT 10 (L) 01/29/2017 2145   ALT 14 11/24/2016 0842   ALKPHOS 53 01/29/2017 2145   ALKPHOS 66 11/24/2016 0842   BILITOT 0.9 01/29/2017 2145   BILITOT 0.96 11/24/2016 0842   GFRNONAA 55 (L) 01/29/2017 2145   GFRAA >60 01/29/2017 2145    No results found for: Ronnald Ramp, A1GS, A2GS, BETS, BETA2SER, GAMS, MSPIKE, SPEI  No results found for: Nils Pyle, [facility]  Lab Results  Component Value Date   WBC 10.7 (H) 01/29/2017   NEUTROABS 8.0 (H) 01/29/2017   HGB 12.1 01/29/2017   HCT 34.8 (L) 01/29/2017   MCV 91.8  01/29/2017   PLT 282 01/29/2017      Chemistry      Component Value Date/Time   NA 122 (L) 01/29/2017 2145   NA 139 11/24/2016 0842   K 3.4 (L) 01/29/2017 2145   K 3.8 11/24/2016 0842   CL 89 (L) 01/29/2017 2145   CO2 22 01/29/2017 2145   CO2 26 11/24/2016 0842   BUN 9 01/29/2017 2145   BUN 15.4 11/24/2016 0842   CREATININE 1.00 01/29/2017 2145   CREATININE 0.8 11/24/2016 0842      Component Value Date/Time   CALCIUM 9.0 01/29/2017 2145   CALCIUM 9.8 11/24/2016 0842   ALKPHOS 53 01/29/2017 2145   ALKPHOS 66 11/24/2016 0842   AST 29 01/29/2017 2145   AST 22 11/24/2016 0842   ALT 10 (L) 01/29/2017 2145   ALT 14 11/24/2016 0842   BILITOT 0.9 01/29/2017 2145   BILITOT 0.96 11/24/2016 0842       No results found for: LABCA2  No components found for: MLJQGB201  No results for input(s): INR in the last 168 hours.  Urinalysis    Component Value Date/Time   COLORURINE YELLOW 01/22/2013 [place] 01/22/2013 1345   LABSPEC 1.011 01/22/2013 1345   PHURINE 6.5 01/22/2013 1345   GLUCOSEU NEGATIVE 01/22/2013 1345   HGBUR TRACE (A) 01/22/2013 1345   BILIRUBINUR NEGATIVE 01/22/2013 1345   KETONESUR NEGATIVE 01/22/2013 1345   PROTEINUR NEGATIVE 01/22/2013 1345  UROBILINOGEN 0.2 01/22/2013 1345   NITRITE NEGATIVE 01/22/2013 1345   LEUKOCYTESUR NEGATIVE 01/22/2013 1345     STUDIES: No results found.   ELIGIBLE FOR AVAILABLE RESEARCH PROTOCOL: no  ASSESSMENT: 76 y.o. [place] woman status post right breast upper outer quadrant biopsy 11/15/2016 for a clinical T2 N0, stage IB invasive lobular carcinoma, grade 1 or 2, estrogen and progesterone receptor positive, HER-2 nonamplified, with an MIB-1 of 5%.  (1) genetics testing pending  (2) anastrozole started 11/24/2016  (3) Status post right mastectomy with sentinel lymph node sampling 01/28/2017 for apT3 pN0, invasive lobular carcinoma, grade 1, with negative margins.   (4) Oncotype DX score of 21  predicts a 10 year risk of recurrence outside the breast of 14% if the patient's only systemic therapy is tamoxifen for 5 years. It also predicts no significant benefit from chemotherapy.  (5) adjuvant radiation to follow  (6) continue letrozole, 10 year treatment plans    PLAN: [name] did generally well with her surgery and she is now ready to start her adjuvant radiation treatments.  We discussed her pathology and Oncotype results in great detail.  She understands that lobular breast cancers are very difficult to detect and that is the reason her tumor grew so large before was found. However her tumor is still grade 1, and estrogen receptor positive. With aromatase inhibitors for 10 years I think she will have an approximately 90% chance of no recurrence over that period of time. This is very favorable.  She has been tolerating the anti-estrogens while and I reassured her that the problems with her swollen ankles are not related to this medication.  She is experiencing some posttraumatic stress. This is very common in my patients at this stage. I think she would greatly benefit from Effexor and I wrote her a prescription for 75 mg. I have asked her to let us know in 3 or 4 weeks if she is not feeling any better in which case we will up the dose 250 mg.  Otherwise she will return to see me as she completes her radiation treatments. We will schedule her for a longer term follow-up at that time  She knows to call for any other issues that may develop before the next visit.  Chauncey Cruel, MD   11/24/2016 8:25 PM Medical Oncology and Hematology [facility] [address] [place], [place] [address] Tel. [phone] Fax. [phone]  This document serves as a record of services personally performed by Lurline Del, MD. It was created on her behalf by Steva Colder, a trained medical scribe. The creation of this record is based on the scribe's personal observations  and the provider's statements to them. This document has been checked and approved by the attending provider.

## 2017-03-14 NOTE — Telephone Encounter (Signed)
Gave patient avs report and appointments for November.  °

## 2017-03-15 ENCOUNTER — Ambulatory Visit
Admission: RE | Admit: 2017-03-15 | Discharge: 2017-03-15 | Disposition: A | Discharge status: Home / Self Care | Payer: Medicare Other | Source: Ambulatory Visit | Attending: Radiation Oncology | Admitting: Radiation Oncology

## 2017-03-15 DIAGNOSIS — C50211 Malignant neoplasm of upper-inner quadrant of right female breast: Secondary | ICD-10-CM | POA: Diagnosis not present

## 2017-03-16 ENCOUNTER — Ambulatory Visit: Payer: Medicare Other

## 2017-03-16 DIAGNOSIS — M25511 Pain in right shoulder: Secondary | ICD-10-CM

## 2017-03-16 DIAGNOSIS — M25611 Stiffness of right shoulder, not elsewhere classified: Secondary | ICD-10-CM

## 2017-03-16 DIAGNOSIS — M6281 Muscle weakness (generalized): Secondary | ICD-10-CM

## 2017-03-16 DIAGNOSIS — R293 Abnormal posture: Secondary | ICD-10-CM

## 2017-03-16 DIAGNOSIS — R600 Localized edema: Secondary | ICD-10-CM

## 2017-03-16 NOTE — Therapy (Signed)
[place], [place], [address] Phone: [phone]   Fax:  [phone]  Physical Therapy Treatment  Patient Details  Name: [name] MRN: [id number] Date of Birth: 05-07-1941 Referring Provider: Thimmappa  Encounter Date: 03/16/2017      PT End of Session - 03/16/17 1349    Visit Number 3   Number of Visits 9   Date for PT Re-Evaluation 03/31/17   PT Start Time 1302   PT Stop Time 1350   PT Time Calculation (min) 48 min   Activity Tolerance Patient tolerated treatment well   Behavior During Therapy WFL for tasks assessed/performed      Past Medical History:  Diagnosis Date  . Anemia    history of  . Anxiety   . Arthritis   . Asthma   . Breast cancer ([place])   . Cancer ([place])    skin cancers, one melanoma   . Complication of anesthesia    patient woke up during colonoscopy  . Concussion    8 yrs. ago due to being thrown from a horse  . Dislocated elbow 10/12/2016   left  . Dysrhythmia 12/2016   A-fib  . Family history of breast cancer   . Hypertension   . 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 Kidney stones    hx of   . Melanoma ([place])   . Persistent atrial fibrillation ([place]) 12/16/2016   CHA2DS2VASC score 3  . Pneumonia    hx of     Past Surgical History:  Procedure Laterality Date  . brachial skin removal due to deforimity Bilateral   . BREAST RECONSTRUCTION WITH PLACEMENT OF TISSUE EXPANDER AND FLEX HD (ACELLULAR HYDRATED DERMIS) Right 01/28/2017   Procedure: RIGHT BREAST RECONSTRUCTION WITH PLACEMENT OF TISSUE EXPANDER AND ALLODERM;  Surgeon: [name], MD;  Location: [place];  Service: Plastics;  Laterality: Right;  . BUNIONECTOMY     left   . CATARACT EXTRACTION     bilaterla cataract surgery   . COLONOSCOPY W/ POLYPECTOMY    . KNEE ARTHROSCOPY     left   . MASTECTOMY W/ SENTINEL NODE BIOPSY Right 01/28/2017   Procedure: RIGHT TOTAL MASTECTOMY WITH SENTINEL  LYMPH NODE BIOPSY;  Surgeon: Excell Seltzer, MD;  Location: [place];  Service: General;  Laterality: Right;  . MELANOMA EXCISION Left   . polyp removed from uterus     . right wrist pinning     . skin cancers removed    . TOTAL KNEE ARTHROPLASTY Right 01/29/2013   Procedure: RIGHT TOTAL KNEE ARTHROPLASTY;  Surgeon: Gearlean Alf, MD;  [facility];  Service: Orthopedics;  Laterality: Right;    There were no vitals filed for this visit.      Subjective Assessment - 03/16/17 1305    Subjective My Rt shoulder was a little sore after last visit but my soreness in my Rt axilla is better. I've been wearing  the compression bra daily and I think that has helped. I've tried the foam and it was okay but I had trouble keeping it in my bra. I saw Dr. Sondra Come yesterday, my simulation went well and I start radiation next Tuesday (03/22/17.    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 It measures 3.5 cm and is located in the upper inner quadrant. It is ER/PR positive and HER2 negative. She had a traumatic fall 10/12/16 which resulted in a  left elbow dislocation, She had a right total knee replacement in 2014 and has a left total knee replacement scheduled for 01/10/17., new onset of A-fib, underwent R mastectomy on 01/28/17, and SLNB all clear, pt has to complete radiation   Patient Stated Goals to be able to move my arm without any pain   Currently in Pain? No/denies            [facility] PT Assessment - 03/16/17 0001      AROM   Right Shoulder Flexion 162 Degrees  min tightness felt now   Right Shoulder ABduction 165 Degrees   Right Shoulder Internal Rotation 66 Degrees  painful                     [facility] Adult PT Treatment/Exercise - 03/16/17 0001      Shoulder Exercises: Supine   External Rotation AAROM;Right;5 reps  with dowel for 5 second holds   Flexion AAROM;Both;5 reps  With dowel holding 5 seconds    ABduction AAROM;Right;5 reps  with dowel for 5 second holds   Other Supine Exercises Practiced radiatio position with fingers clasped behind head and abdcucting elbows 3 times with very little stretch felt by patient     Manual Therapy   Manual Therapy Manual Lymphatic Drainage (MLD);Passive ROM;Myofascial release   Myofascial Release Gentle UE pulling during P/ROM   Manual Lymphatic Drainage (MLD) In Supine: Short neck, superficial and deep abdominals, Lt axilla and Rt inguinal nodes, anterior inter-axillary and Rt axillo-inguinal anastomosis then focused on Rt lateral trunk and upper arm.   Passive ROM In Supine: Into flexion, abduction and er to pts tolerance                PT Education - 03/16/17 1322    Education provided Yes   Education Details Supine dowel exercises   Person(s) Educated Patient   Methods Explanation;Demonstration;Handout   Comprehension Verbalized understanding;Returned demonstration                [facility] Clinic Goals - 03/03/17 1700      CC Long Term Goal  #1   Title Pt to be able to independently verbalize lymphedema risk reduction practices   Time 4   Period Weeks   Status New   Target Date 03/31/17     CC Long Term Goal  #2   Title Pt to be independent in a home exercise program for continued strengthening and stretching   Time 4   Period Weeks   Status New   Target Date 03/31/17     CC Long Term Goal  #3   Title Pt to demonstrate 160 degrees of right shoulder abduction to allow her to reach items out to sides   Baseline 150   Time 4   Period Weeks   Status New   Target Date 03/31/17     CC Long Term Goal  #4   Title Pt to report a 75% decrease in pain in right UE with RUE movements to allow pt to return to prior level of function   Time 4   Period Weeks   Status New   Target Date 03/31/17     CC Long Term Goal  #5   Title Pt to report a 75% improvement in edema in right lateral trunk to allow improved comfort   Time  4   Period Weeks   Status New            Plan -  03/16/17 1430    Clinical Impression Statement Pt has improved well with tightness and A/ROM. Also reports improvement thus far with her Rt axillary swelling. She begins radiation next week and would benefit fro continuing therapy for a few more weeks to work on decreasing end ROM tightness and decrease Rt axillary/lateral trunk swelling.    Rehab Potential Good   Clinical Impairments Affecting Rehab Potential pt to begin radiation   PT Frequency 2x / week   PT Duration 4 weeks   PT Treatment/Interventions Patient/family education;Therapeutic exercise;ADLs/Self Care Home Management;Therapeutic activities;Manual lymph drainage;Manual techniques;Compression bandaging;Scar mobilization;Passive range of motion;Taping;Orthotic Fit/Training   PT Next Visit Plan Review dowel stretches to HEP; issue lymphedema risk reduction practices and educate pt, begin MLD to right trunk and gentle AA/A/PROM to R shoulder   Consulted and Agree with Plan of Care Patient      Patient will benefit from skilled therapeutic intervention in order to improve the following deficits and impairments:  Pain, Increased fascial restricitons, Decreased scar mobility, Postural dysfunction, Impaired UE functional use, Decreased strength, Decreased range of motion, Increased edema, Decreased knowledge of precautions  Visit Diagnosis: Acute pain of right shoulder  Stiffness of right shoulder, not elsewhere classified  Muscle weakness (generalized)  Localized edema  Abnormal posture     Problem List Patient Active Problem List   Diagnosis Date Noted  . Genetic testing 12/24/2016  . Persistent atrial fibrillation ([place]) 12/16/2016  . Long term current use of anticoagulant therapy 12/16/2016  . 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 Family history of breast cancer   . Melanoma ([place])   . Malignant neoplasm of upper-inner quadrant of right breast in female,  estrogen receptor positive ([place]) 11/19/2016  . OA (osteoarthritis) of knee 01/29/2013    Otelia Limes, PTA 03/16/2017, 2:37 PM  [place], [place], [address] Phone: [phone]   Fax:  [phone]  Name: [name] MRN: [id number] Date of Birth: 15-Jan-1941

## 2017-03-16 NOTE — Patient Instructions (Signed)
SHOULDER: Flexion - Supine (Cane)        Cancer Rehab [id number] ° ° ° °Hold cane in both hands. Raise arms up overhead. Do not allow back to arch. Hold _5__ seconds. Do __5-10__ times; __1-2__ times a day. ° ° °SELF ASSISTED WITH OBJECT: Shoulder Abduction / Adduction - Supine ° ° ° °Hold cane with both hands. Move both arms from side to side, keep elbows straight.  Hold when stretch felt for __5__ seconds. Repeat __5-10__ times; __1-2__ times a day. Once this becomes easier progress to third picture bringing affected arm towards ear by staying out to side. Same hold for _5_seconds. Repeat  _5-10_ times, _1-2_ times/day. ° °Shoulder Blade Stretch ° ° ° °Clasp fingers behind head with elbows touching in front of face. Pull elbows back while pressing shoulder blades together. Relax and hold as tolerated, can place pillow under elbow here for comfort as needed and to allow for prolonged stretch.  °Repeat __5__ times. Do __1-2__ sessions per day. ° ° ° ° ° ° °

## 2017-03-18 ENCOUNTER — Encounter: Payer: Self-pay | Admitting: Physical Therapy

## 2017-03-18 ENCOUNTER — Ambulatory Visit: Payer: Medicare Other | Admitting: Physical Therapy

## 2017-03-18 DIAGNOSIS — M25511 Pain in right shoulder: Secondary | ICD-10-CM

## 2017-03-18 DIAGNOSIS — M25611 Stiffness of right shoulder, not elsewhere classified: Secondary | ICD-10-CM

## 2017-03-18 DIAGNOSIS — R600 Localized edema: Secondary | ICD-10-CM

## 2017-03-18 NOTE — Therapy (Signed)
[place], [place], [address] Phone: [phone]   Fax:  [phone]  Physical Therapy Treatment  Patient Details  Name: [name] MRN: [id number] Date of Birth: 06-03-1941 Referring Provider: Thimmappa  Encounter Date: 03/18/2017      PT End of Session - 03/18/17 1026    Visit Number 4   Number of Visits 9   Date for PT Re-Evaluation 03/31/17   PT Start Time 0938   PT Stop Time 1020   PT Time Calculation (min) 42 min   Activity Tolerance Patient tolerated treatment well   Behavior During Therapy [facility] for tasks assessed/performed      Past Medical History:  Diagnosis Date  . Anemia    history of  . Anxiety   . Arthritis   . Asthma   . Breast cancer ([place])   . Cancer ([place])    skin cancers, one melanoma   . Complication of anesthesia    patient woke up during colonoscopy  . Concussion    8 yrs. ago due to being thrown from a horse  . Dislocated elbow 10/12/2016   left  . Dysrhythmia 12/2016   A-fib  . Family history of breast cancer   . Hypertension   . 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 Kidney stones    hx of   . Melanoma ([place])   . Persistent atrial fibrillation ([place]) 12/16/2016   CHA2DS2VASC score 3  . Pneumonia    hx of     Past Surgical History:  Procedure Laterality Date  . brachial skin removal due to deforimity Bilateral   . BREAST RECONSTRUCTION WITH PLACEMENT OF TISSUE EXPANDER AND FLEX HD (ACELLULAR HYDRATED DERMIS) Right 01/28/2017   Procedure: RIGHT BREAST RECONSTRUCTION WITH PLACEMENT OF TISSUE EXPANDER AND ALLODERM;  Surgeon: [name], MD;  Location: [place];  Service: Plastics;  Laterality: Right;  . BUNIONECTOMY     left   . CATARACT EXTRACTION     bilaterla cataract surgery   . COLONOSCOPY W/ POLYPECTOMY    . KNEE ARTHROSCOPY     left   . MASTECTOMY W/ SENTINEL NODE BIOPSY Right 01/28/2017   Procedure: RIGHT TOTAL MASTECTOMY WITH SENTINEL  LYMPH NODE BIOPSY;  Surgeon: Excell Seltzer, MD;  Location: [place];  Service: General;  Laterality: Right;  . MELANOMA EXCISION Left   . polyp removed from uterus     . right wrist pinning     . skin cancers removed    . TOTAL KNEE ARTHROPLASTY Right 01/29/2013   Procedure: RIGHT TOTAL KNEE ARTHROPLASTY;  Surgeon: Gearlean Alf, MD;  [facility];  Service: Orthopedics;  Laterality: Right;    There were no vitals filed for this visit.      Subjective Assessment - 03/18/17 0940    Subjective I can tell some of the swelling has gone down. I did have some soreness after I was here on Wednesday. It always hurts the day after.    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 It measures 3.5 cm and is located in the upper inner quadrant. It is ER/PR positive and HER2 negative. She had a traumatic fall 10/12/16 which resulted in a left elbow dislocation, She had a right total knee replacement in 2014 and has a left total knee replacement scheduled for 01/10/17., new onset of A-fib, underwent R mastectomy on 01/28/17, and SLNB all clear, pt has to complete  radiation   Patient Stated Goals to be able to move my arm without any pain   Currently in Pain? Yes   Pain Score 5    Pain Location Breast   Pain Orientation Right   Pain Descriptors / Indicators Sharp   Pain Type Surgical pain   Pain Radiating Towards towards inner breast   Pain Onset More than a month ago   Pain Frequency Intermittent                         [facility] Adult PT Treatment/Exercise - 03/18/17 0001      Manual Therapy   Manual Therapy Manual Lymphatic Drainage (MLD);Passive ROM;Myofascial release   Myofascial Release Gentle UE pulling during P/ROM, to cording under right breast using cross friction and unidirectional stretch   Manual Lymphatic Drainage (MLD) In Supine: Short neck, superficial and deep abdominals, Rt inguinal nodes, Rt  axillo-inguinal anastomosis then focused on Rt lateral trunk, then to left sidelying working on posterior interaxillary pathway   Passive ROM In Supine: Into flexion, abduction to patients tolerance                        [facility] Goals - 03/18/17 0941      CC Long Term Goal  #1   Title Pt to be able to independently verbalize lymphedema risk reduction practices   Baseline 03/18/17- educated pt on this today   Time 4   Period Weeks   Status On-going     CC Long Term Goal  #2   Title Pt to be independent in a home exercise program for continued strengthening and stretching   Time 4   Period Weeks   Status On-going     CC Long Term Goal  #3   Title Pt to demonstrate 160 degrees of right shoulder abduction to allow her to reach items out to sides   Baseline 150, 03/18/17- 160 degrees with tightness   Time 4   Period Weeks   Status Achieved     CC Long Term Goal  #4   Title Pt to report a 75% decrease in pain in right UE with RUE movements to allow pt to return to prior level of function   Baseline 03/18/17- 20% decrease   Time 4   Period Weeks   Status On-going     CC Long Term Goal  #5   Title Pt to report a 75% improvement in edema in right lateral trunk to allow improved comfort   Baseline 03/18/17- 25% improvement   Time 4   Period Weeks   Status On-going            Plan - 03/18/17 1027    Clinical Impression Statement Assessed pt's progress towards goals in therapy. She has met her abduction ROM goal but still has tightness with this. She is demonstrating improvement with swelling and pain. She continues to have pain in area under inferior right breast. Today therapist was able to palpate 3 cords going from inferior right breast down towards abdomen. Performed myofascial release to this area with some softening of cording noted. Pt did notice some increase in swelling just inferior to her right axilla at end of session after working on  cording. Educated pt about anterior interaxillary pathway and performing self MLD to this area as well as using chip pack once she gets home.    Rehab Potential Good   Clinical  Impairments Affecting Rehab Potential pt to begin radiation   PT Frequency 2x / week   PT Duration 4 weeks   PT Treatment/Interventions Patient/family education;Therapeutic exercise;ADLs/Self Care Home Management;Therapeutic activities;Manual lymph drainage;Manual techniques;Compression bandaging;Scar mobilization;Passive range of motion;Taping;Orthotic Fit/Training   PT Next Visit Plan Review dowel stretches to HEP; gentle AA/A/PROM to R shoulder, MLD R trunk, myofascial to cording in inferior right breast   Consulted and Agree with Plan of Care Patient      Patient will benefit from skilled therapeutic intervention in order to improve the following deficits and impairments:  Pain, Increased fascial restricitons, Decreased scar mobility, Postural dysfunction, Impaired UE functional use, Decreased strength, Decreased range of motion, Increased edema, Decreased knowledge of precautions  Visit Diagnosis: Acute pain of right shoulder  Stiffness of right shoulder, not elsewhere classified  Localized edema     Problem List Patient Active Problem List   Diagnosis Date Noted  . Genetic testing 12/24/2016  . Persistent atrial fibrillation ([place]) 12/16/2016  . Long term current use of anticoagulant therapy 12/16/2016  . 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 Family history of breast cancer   . Melanoma ([place])   . Malignant neoplasm of upper-inner quadrant of right breast in female, estrogen receptor positive ([place]) 11/19/2016  . OA (osteoarthritis) of knee 01/29/2013    Allyson Sabal [facility] 03/18/2017, 10:31 AM  [place], [place], [address] Phone: [phone]   Fax:  [phone]  Name: [name] MRN: [id number] Date of  Birth: 1941/02/06  Manus Gunning, PT 03/18/17 10:31 AM

## 2017-03-21 DIAGNOSIS — C50211 Malignant neoplasm of upper-inner quadrant of right female breast: Secondary | ICD-10-CM | POA: Diagnosis not present

## 2017-03-22 ENCOUNTER — Ambulatory Visit
Admission: RE | Admit: 2017-03-22 | Discharge: 2017-03-22 | Disposition: A | Discharge status: Home / Self Care | Payer: Medicare Other | Source: Ambulatory Visit | Attending: Radiation Oncology | Admitting: Radiation Oncology

## 2017-03-22 ENCOUNTER — Ambulatory Visit: Payer: Medicare Other

## 2017-03-22 DIAGNOSIS — M25511 Pain in right shoulder: Secondary | ICD-10-CM | POA: Diagnosis not present

## 2017-03-22 DIAGNOSIS — C50211 Malignant neoplasm of upper-inner quadrant of right female breast: Secondary | ICD-10-CM | POA: Diagnosis not present

## 2017-03-22 DIAGNOSIS — R600 Localized edema: Secondary | ICD-10-CM

## 2017-03-22 DIAGNOSIS — M25611 Stiffness of right shoulder, not elsewhere classified: Secondary | ICD-10-CM

## 2017-03-22 NOTE — Therapy (Signed)
[place], [place], [address] Phone: [phone]   Fax:  [phone]  Physical Therapy Treatment  Patient Details  Name: [name] MRN: [id number] Date of Birth: January 09, 1941 Referring Provider: Thimmappa  Encounter Date: 03/22/2017      PT End of Session - 03/22/17 1103    Visit Number 5   Number of Visits 9   Date for PT Re-Evaluation 03/31/17   PT Start Time 1022   PT Stop Time 1105   PT Time Calculation (min) 43 min   Activity Tolerance Patient tolerated treatment well   Behavior During Therapy WFL for tasks assessed/performed      Past Medical History:  Diagnosis Date  . Anemia    history of  . Anxiety   . Arthritis   . Asthma   . Breast cancer ([place])   . Cancer ([place])    skin cancers, one melanoma   . Complication of anesthesia    patient woke up during colonoscopy  . Concussion    8 yrs. ago due to being thrown from a horse  . Dislocated elbow 10/12/2016   left  . Dysrhythmia 12/2016   A-fib  . Family history of breast cancer   . Hypertension   . 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 Kidney stones    hx of   . Melanoma ([place])   . Persistent atrial fibrillation ([place]) 12/16/2016   CHA2DS2VASC score 3  . Pneumonia    hx of     Past Surgical History:  Procedure Laterality Date  . brachial skin removal due to deforimity Bilateral   . BREAST RECONSTRUCTION WITH PLACEMENT OF TISSUE EXPANDER AND FLEX HD (ACELLULAR HYDRATED DERMIS) Right 01/28/2017   Procedure: RIGHT BREAST RECONSTRUCTION WITH PLACEMENT OF TISSUE EXPANDER AND ALLODERM;  Surgeon: [name], MD;  Location: [place];  Service: Plastics;  Laterality: Right;  . BUNIONECTOMY     left   . CATARACT EXTRACTION     bilaterla cataract surgery   . COLONOSCOPY W/ POLYPECTOMY    . KNEE ARTHROSCOPY     left   . MASTECTOMY W/ SENTINEL NODE BIOPSY Right 01/28/2017   Procedure: RIGHT TOTAL MASTECTOMY WITH SENTINEL  LYMPH NODE BIOPSY;  Surgeon: Excell Seltzer, MD;  Location: [place];  Service: General;  Laterality: Right;  . MELANOMA EXCISION Left   . polyp removed from uterus     . right wrist pinning     . skin cancers removed    . TOTAL KNEE ARTHROPLASTY Right 01/29/2013   Procedure: RIGHT TOTAL KNEE ARTHROPLASTY;  Surgeon: Gearlean Alf, MD;  [facility];  Service: Orthopedics;  Laterality: Right;    There were no vitals filed for this visit.      Subjective Assessment - 03/22/17 1025    Subjective The swelling continues to improve and though I continue to be sore after each visit it's worth it because I am getting so much better! I'm not hurting with my arm at my side but I have a spot that's tender to touch at the base of my axilla.    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 It measures 3.5 cm and is located in the upper inner quadrant. It is ER/PR positive and HER2 negative. She had a traumatic fall 10/12/16 which resulted in a left elbow dislocation, She had a right total knee replacement in 2014 and has a left total  knee replacement scheduled for 01/10/17., new onset of A-fib, underwent R mastectomy on 01/28/17, and SLNB all clear, pt has to complete radiation   Patient Stated Goals to be able to move my arm without any pain   Currently in Pain? No/denies                         [facility] Adult PT Treatment/Exercise - 03/22/17 0001      Manual Therapy   Manual Therapy Manual Lymphatic Drainage (MLD);Passive ROM;Myofascial release   Myofascial Release Gentle UE pulling during P/ROM, to cording under right breast using cross friction and unidirectional stretch   Manual Lymphatic Drainage (MLD) In Supine: Short neck, superficial and deep abdominals, Rt inguinal nodes, Rt axillo-inguinal anastomosis, then focused on Rt lateral trunk, then to left sidelying working for cont focus to Rt axillo-inguinal anastomosis.    Passive ROM In Supine: Into flexion, abduction, and D2 to patients tolerance                        Long Term Clinic Goals - 03/18/17 0941      CC Long Term Goal  #1   Title Pt to be able to independently verbalize lymphedema risk reduction practices   Baseline 03/18/17- educated pt on this today   Time 4   Period Weeks   Status On-going     CC Long Term Goal  #2   Title Pt to be independent in a home exercise program for continued strengthening and stretching   Time 4   Period Weeks   Status On-going     CC Long Term Goal  #3   Title Pt to demonstrate 160 degrees of right shoulder abduction to allow her to reach items out to sides   Baseline 150, 03/18/17- 160 degrees with tightness   Time 4   Period Weeks   Status Achieved     CC Long Term Goal  #4   Title Pt to report a 75% decrease in pain in right UE with RUE movements to allow pt to return to prior level of function   Baseline 03/18/17- 20% decrease   Time 4   Period Weeks   Status On-going     CC Long Term Goal  #5   Title Pt to report a 75% improvement in edema in right lateral trunk to allow improved comfort   Baseline 03/18/17- 25% improvement   Time 4   Period Weeks   Status On-going            Plan - 03/22/17 1106    Clinical Impression Statement Continued with focus on manual therapy as pt reports feeling independent with dowel exercises and has been doing these daily. A "pop" was felt and heard by pt and therapist during stretching on of cording inferior to breast and one was less palpable by end of session. Encouraged pt to wear her compresion foam here that she has at home to decrease friction from bra to this area. Pt has her first radiation treatment today.    Clinical Impairments Affecting Rehab Potential pt to begin radiation 03/22/17   PT Frequency 2x / week   PT Duration 4 weeks   PT Treatment/Interventions Patient/family education;Therapeutic exercise;ADLs/Self Care Home  Management;Therapeutic activities;Manual lymph drainage;Manual techniques;Compression bandaging;Scar mobilization;Passive range of motion;Taping;Orthotic Fit/Training   PT Next Visit Plan Gentle AA/A/PROM to R shoulder, MLD R trunk, myofascial to cording in inferior right breast; assess how  breast/cording felt after session with "pop" felt   Consulted and Agree with Plan of Care Patient      Patient will benefit from skilled therapeutic intervention in order to improve the following deficits and impairments:  Pain, Increased fascial restricitons, Decreased scar mobility, Postural dysfunction, Impaired UE functional use, Decreased strength, Decreased range of motion, Increased edema, Decreased knowledge of precautions  Visit Diagnosis: Acute pain of right shoulder  Stiffness of right shoulder, not elsewhere classified  Localized edema     Problem List Patient Active Problem List   Diagnosis Date Noted  . Genetic testing 12/24/2016  . Persistent atrial fibrillation ([place]) 12/16/2016  . Long term current use of anticoagulant therapy 12/16/2016  . 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 Family history of breast cancer   . Melanoma ([place])   . Malignant neoplasm of upper-inner quadrant of right breast in female, estrogen receptor positive ([place]) 11/19/2016  . OA (osteoarthritis) of knee 01/29/2013    Otelia Limes, PTA 03/22/2017, 11:09 AM  [place], [place], [address] Phone: [phone]   Fax:  [phone]  Name: [name] MRN: [id number] Date of Birth: [birth year]

## 2017-03-23 ENCOUNTER — Encounter: Payer: Self-pay | Admitting: Oncology

## 2017-03-23 ENCOUNTER — Ambulatory Visit
Admission: RE | Admit: 2017-03-23 | Discharge: 2017-03-23 | Disposition: A | Discharge status: Home / Self Care | Payer: Medicare Other | Source: Ambulatory Visit | Attending: Radiation Oncology | Admitting: Radiation Oncology

## 2017-03-23 DIAGNOSIS — C50211 Malignant neoplasm of upper-inner quadrant of right female breast: Secondary | ICD-10-CM | POA: Diagnosis not present

## 2017-03-24 ENCOUNTER — Encounter: Payer: Self-pay | Admitting: Physical Therapy

## 2017-03-24 ENCOUNTER — Ambulatory Visit: Payer: Medicare Other | Admitting: Physical Therapy

## 2017-03-24 ENCOUNTER — Ambulatory Visit
Admission: RE | Admit: 2017-03-24 | Discharge: 2017-03-24 | Disposition: A | Discharge status: Home / Self Care | Payer: Medicare Other | Source: Ambulatory Visit | Attending: Radiation Oncology | Admitting: Radiation Oncology

## 2017-03-24 DIAGNOSIS — M25511 Pain in right shoulder: Secondary | ICD-10-CM | POA: Diagnosis not present

## 2017-03-24 DIAGNOSIS — C50211 Malignant neoplasm of upper-inner quadrant of right female breast: Secondary | ICD-10-CM | POA: Diagnosis not present

## 2017-03-24 DIAGNOSIS — M25611 Stiffness of right shoulder, not elsewhere classified: Secondary | ICD-10-CM

## 2017-03-24 DIAGNOSIS — R600 Localized edema: Secondary | ICD-10-CM

## 2017-03-24 NOTE — Therapy (Signed)
[place], [place], [address] Phone: [phone]   Fax:  [phone]  Physical Therapy Treatment  Patient Details  Name: [name] MRN: [id number] Date of Birth: [birth year] Referring Provider: Thimmappa  Encounter Date: 03/24/2017      PT End of Session - 03/24/17 1724    Visit Number 6   Number of Visits 9   Date for PT Re-Evaluation 03/31/17   PT Start Time 1302   PT Stop Time 1348   PT Time Calculation (min) 46 min   Activity Tolerance Patient tolerated treatment well   Behavior During Therapy [facility] for tasks assessed/performed      Past Medical History:  Diagnosis Date  . Anemia    history of  . Anxiety   . Arthritis   . Asthma   . Breast cancer ([place])   . Cancer ([place])    skin cancers, one melanoma   . Complication of anesthesia    patient woke up during colonoscopy  . Concussion    8 yrs. ago due to being thrown from a horse  . Dislocated elbow 10/12/2016   left  . Dysrhythmia 12/2016   A-fib  . Family history of breast cancer   . Hypertension   . 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 Kidney stones    hx of   . Melanoma ([place])   . Persistent atrial fibrillation ([place]) 12/16/2016   CHA2DS2VASC score 3  . Pneumonia    hx of     Past Surgical History:  Procedure Laterality Date  . brachial skin removal due to deforimity Bilateral   . BREAST RECONSTRUCTION WITH PLACEMENT OF TISSUE EXPANDER AND FLEX HD (ACELLULAR HYDRATED DERMIS) Right 01/28/2017   Procedure: RIGHT BREAST RECONSTRUCTION WITH PLACEMENT OF TISSUE EXPANDER AND ALLODERM;  Surgeon: [name], MD;  Location: [place];  Service: Plastics;  Laterality: Right;  . BUNIONECTOMY     left   . CATARACT EXTRACTION     bilaterla cataract surgery   . COLONOSCOPY W/ POLYPECTOMY    . KNEE ARTHROSCOPY     left   . MASTECTOMY W/ SENTINEL NODE BIOPSY Right 01/28/2017   Procedure: RIGHT TOTAL MASTECTOMY WITH SENTINEL  LYMPH NODE BIOPSY;  Surgeon: Excell Seltzer, MD;  Location: [place];  Service: General;  Laterality: Right;  . MELANOMA EXCISION Left   . polyp removed from uterus     . right wrist pinning     . skin cancers removed    . TOTAL KNEE ARTHROPLASTY Right 01/29/2013   Procedure: RIGHT TOTAL KNEE ARTHROPLASTY;  Surgeon: Gearlean Alf, MD;  [facility];  Service: Orthopedics;  Laterality: Right;    There were no vitals filed for this visit.      Subjective Assessment - 03/24/17 1303    Subjective I am swelling from the radiation so I can feel that. The cording under my breast is getting better. That has been the biggest relief. There are no more sharp pains.    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 It measures 3.5 cm and is located in the upper inner quadrant. It is ER/PR positive and HER2 negative. She had a traumatic fall 10/12/16 which resulted in a left elbow dislocation, She had a right total knee replacement in 2014 and has a left total knee replacement scheduled for 01/10/17., new onset of A-fib, underwent R mastectomy on 01/28/17, and SLNB all clear,  pt has to complete radiation   Patient Stated Goals to be able to move my arm without any pain   Currently in Pain? No/denies   Pain Score 0-No pain                         [facility] Adult PT Treatment/Exercise - 03/24/17 0001      Manual Therapy   Manual Therapy Manual Lymphatic Drainage (MLD);Passive ROM;Myofascial release   Myofascial Release Gentle UE pulling during P/ROM, to cording under right breast using cross friction and unidirectional stretch, gentle myofascial release to area of tightness in inner breast   Manual Lymphatic Drainage (MLD) In Supine: Short neck, superficial and deep abdominals, Rt inguinal nodes, Rt axillo-inguinal anastomosis, then focused on Rt lateral trunk, then to left sidelying working for cont focus to Rt axillo-inguinal  anastomosis.   Passive ROM In Supine: In to flexion, abduction, and ER                        Long Term Clinic Goals - 03/18/17 0941      CC Long Term Goal  #1   Title Pt to be able to independently verbalize lymphedema risk reduction practices   Baseline 03/18/17- educated pt on this today   Time 4   Period Weeks   Status On-going     CC Long Term Goal  #2   Title Pt to be independent in a home exercise program for continued strengthening and stretching   Time 4   Period Weeks   Status On-going     CC Long Term Goal  #3   Title Pt to demonstrate 160 degrees of right shoulder abduction to allow her to reach items out to sides   Baseline 150, 03/18/17- 160 degrees with tightness   Time 4   Period Weeks   Status Achieved     CC Long Term Goal  #4   Title Pt to report a 75% decrease in pain in right UE with RUE movements to allow pt to return to prior level of function   Baseline 03/18/17- 20% decrease   Time 4   Period Weeks   Status On-going     CC Long Term Goal  #5   Title Pt to report a 75% improvement in edema in right lateral trunk to allow improved comfort   Baseline 03/18/17- 25% improvement   Time 4   Period Weeks   Status On-going            Plan - 03/24/17 1724    Clinical Impression Statement Pt is demonstrating significantly less cording at inferior right breast today. She is also demonstrating improved right shoulder ROM. She had some tightness at right inner breast that softened with soft tissue mobilization. Continued with MLD to right trunk because pt felt like she was more swollen today. She states this helps. She is already demonstrating some redness at right inner breast/ sternum area since starting radiation. Continued myofascial to area of cording at right inferior breast.    Rehab Potential Good   Clinical Impairments Affecting Rehab Potential pt to begin radiation 03/22/17   PT Frequency 2x / week   PT Duration 4 weeks   PT  Treatment/Interventions Patient/family education;Therapeutic exercise;ADLs/Self Care Home Management;Therapeutic activities;Manual lymph drainage;Manual techniques;Compression bandaging;Scar mobilization;Passive range of motion;Taping;Orthotic Fit/Training   PT Next Visit Plan Gentle AA/A/PROM to R shoulder, MLD R trunk, myofascial to cording in inferior right  breast;       Patient will benefit from skilled therapeutic intervention in order to improve the following deficits and impairments:  Pain, Increased fascial restricitons, Decreased scar mobility, Postural dysfunction, Impaired UE functional use, Decreased strength, Decreased range of motion, Increased edema, Decreased knowledge of precautions  Visit Diagnosis: Acute pain of right shoulder  Stiffness of right shoulder, not elsewhere classified  Localized edema     Problem List Patient Active Problem List   Diagnosis Date Noted  . Genetic testing 12/24/2016  . Persistent atrial fibrillation ([place]) 12/16/2016  . Long term current use of anticoagulant therapy 12/16/2016  . 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 Family history of breast cancer   . Melanoma ([place])   . Malignant neoplasm of upper-inner quadrant of right breast in female, estrogen receptor positive ([place]) 11/19/2016  . OA (osteoarthritis) of knee 01/29/2013    Allyson Sabal [facility] 03/24/2017, 5:27 PM  [place], [place], [address] Phone: [phone]   Fax:  [phone]  Name: SAPHIA VANDERFORD MRN: [id number] Date of Birth: 09-09-1940  Manus Gunning, PT 03/24/17 5:27 PM

## 2017-03-25 ENCOUNTER — Ambulatory Visit
Admission: RE | Admit: 2017-03-25 | Discharge: 2017-03-25 | Disposition: A | Discharge status: Home / Self Care | Payer: Medicare Other | Source: Ambulatory Visit | Attending: Radiation Oncology | Admitting: Radiation Oncology

## 2017-03-25 DIAGNOSIS — C50211 Malignant neoplasm of upper-inner quadrant of right female breast: Secondary | ICD-10-CM | POA: Diagnosis not present

## 2017-03-28 ENCOUNTER — Ambulatory Visit
Admission: RE | Admit: 2017-03-28 | Discharge: 2017-03-28 | Disposition: A | Discharge status: Home / Self Care | Payer: Medicare Other | Source: Ambulatory Visit | Attending: Radiation Oncology | Admitting: Radiation Oncology

## 2017-03-28 DIAGNOSIS — C50211 Malignant neoplasm of upper-inner quadrant of right female breast: Secondary | ICD-10-CM | POA: Diagnosis not present

## 2017-03-29 ENCOUNTER — Ambulatory Visit
Admission: RE | Admit: 2017-03-29 | Discharge: 2017-03-29 | Disposition: A | Discharge status: Home / Self Care | Payer: Medicare Other | Source: Ambulatory Visit | Attending: Radiation Oncology | Admitting: Radiation Oncology

## 2017-03-29 DIAGNOSIS — Z170 Estrogen receptor positive status [ER+]: Principal | ICD-10-CM

## 2017-03-29 DIAGNOSIS — C50211 Malignant neoplasm of upper-inner quadrant of right female breast: Secondary | ICD-10-CM | POA: Diagnosis not present

## 2017-03-29 MED ORDER — ALRA NON-METALLIC DEODORANT (RAD-ONC)
1.0000 "application " | Freq: Once | TOPICAL | Status: AC
  Administered 2017-03-29: 1 via TOPICAL

## 2017-03-29 MED ORDER — RADIAPLEXRX EX GEL
Freq: Two times a day (BID) | CUTANEOUS | Status: DC
  Administered 2017-03-29: 17:00:00 via TOPICAL

## 2017-03-29 NOTE — Progress Notes (Signed)
Pt here for patient teaching.  Pt given Radiation and You booklet, skin care instructions, Alra deodorant and Radiaplex gel.  Reviewed areas of pertinence such as fatigue, skin changes, breast tenderness and breast swelling . Pt able to give teach back of apply Radiaplex bid, avoid applying anything to skin within 4 hours of treatment, avoid wearing an under wire bra and to use an electric razor if they must shave. Pt verbalizes understanding of information given and will contact nursing with any questions or concerns.     [email or web address]/treatmentinformation/whattoexpect/index

## 2017-03-30 ENCOUNTER — Ambulatory Visit: Payer: Medicare Other

## 2017-03-30 ENCOUNTER — Ambulatory Visit
Admission: RE | Admit: 2017-03-30 | Discharge: 2017-03-30 | Disposition: A | Discharge status: Home / Self Care | Payer: Medicare Other | Source: Ambulatory Visit | Attending: Radiation Oncology | Admitting: Radiation Oncology

## 2017-03-30 DIAGNOSIS — R600 Localized edema: Secondary | ICD-10-CM

## 2017-03-30 DIAGNOSIS — M25511 Pain in right shoulder: Secondary | ICD-10-CM

## 2017-03-30 DIAGNOSIS — C50211 Malignant neoplasm of upper-inner quadrant of right female breast: Secondary | ICD-10-CM | POA: Diagnosis not present

## 2017-03-30 DIAGNOSIS — M25611 Stiffness of right shoulder, not elsewhere classified: Secondary | ICD-10-CM

## 2017-03-30 NOTE — Therapy (Signed)
[place], [place], [address] Phone: [phone]   Fax:  [phone]  Physical Therapy Treatment  Patient Details  Name: [name] MRN: [id number] Date of Birth: 10/20/1940 Referring Provider: Thimmappa  Encounter Date: 03/30/2017      PT End of Session - 03/30/17 1341    Visit Number 7   Number of Visits 9   Date for PT Re-Evaluation 03/31/17   PT Start Time 1302   PT Stop Time 1497   PT Time Calculation (min) 45 min   Activity Tolerance Patient tolerated treatment well   Behavior During Therapy [facility] for tasks assessed/performed      Past Medical History:  Diagnosis Date  . Anemia    history of  . Anxiety   . Arthritis   . Asthma   . Breast cancer ([place])   . Cancer ([place])    skin cancers, one melanoma   . Complication of anesthesia    patient woke up during colonoscopy  . Concussion    8 yrs. ago due to being thrown from a horse  . Dislocated elbow 10/12/2016   left  . Dysrhythmia 12/2016   A-fib  . Family history of breast cancer   . Hypertension   . 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 Kidney stones    hx of   . Melanoma ([place])   . Persistent atrial fibrillation ([place]) 12/16/2016   CHA2DS2VASC score 3  . Pneumonia    hx of     Past Surgical History:  Procedure Laterality Date  . brachial skin removal due to deforimity Bilateral   . BREAST RECONSTRUCTION WITH PLACEMENT OF TISSUE EXPANDER AND FLEX HD (ACELLULAR HYDRATED DERMIS) Right 01/28/2017   Procedure: RIGHT BREAST RECONSTRUCTION WITH PLACEMENT OF TISSUE EXPANDER AND ALLODERM;  Surgeon: [name], MD;  Location: [place];  Service: Plastics;  Laterality: Right;  . BUNIONECTOMY     left   . CATARACT EXTRACTION     bilaterla cataract surgery   . COLONOSCOPY W/ POLYPECTOMY    . KNEE ARTHROSCOPY     left   . MASTECTOMY W/ SENTINEL NODE BIOPSY Right 01/28/2017   Procedure: RIGHT TOTAL MASTECTOMY WITH SENTINEL  LYMPH NODE BIOPSY;  Surgeon: Excell Seltzer, MD;  Location: [place];  Service: General;  Laterality: Right;  . MELANOMA EXCISION Left   . polyp removed from uterus     . right wrist pinning     . skin cancers removed    . TOTAL KNEE ARTHROPLASTY Right 01/29/2013   Procedure: RIGHT TOTAL KNEE ARTHROPLASTY;  Surgeon: Gearlean Alf, MD;  [facility];  Service: Orthopedics;  Laterality: Right;    There were no vitals filed for this visit.      Subjective Assessment - 03/30/17 1307    Subjective I still have actually been feeling better, the redness even seems to have improved a bit which I know is surprising. The cording inferior to my Rt breast is still there but much better than it was! I have started having sharp, pinching pains that the doctor told me is common from radiation.    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 It measures 3.5 cm and is located in the upper inner quadrant. It is ER/PR positive and HER2 negative. She had a traumatic fall 10/12/16 which resulted in a left elbow dislocation, She had a right total knee replacement in 2014 and  has a left total knee replacement scheduled for 01/10/17., new onset of A-fib, underwent R mastectomy on 01/28/17, and SLNB all clear, pt has to complete radiation   Patient Stated Goals to be able to move my arm without any pain   Currently in Pain? No/denies                         [facility] Adult PT Treatment/Exercise - 03/30/17 0001      Shoulder Exercises: Pulleys   Flexion 2 minutes   Flexion Limitations Pt reports feeling minimal stretch on Rt after having P/ROM   ABduction 2 minutes     Shoulder Exercises: Therapy Ball   Flexion 10 reps  With forward lean into end of stretch     Manual Therapy   Manual Therapy Manual Lymphatic Drainage (MLD);Passive ROM;Myofascial release   Myofascial Release Gentle UE pulling during P/ROM, to cording under right  breast using cross friction and unidirectional stretch, gentle myofascial release to area of tightness in inner breast   Manual Lymphatic Drainage (MLD) In Supine: Short neck, superficial and deep abdominals, Rt inguinal nodes, Rt axillo-inguinal anastomosis, then focused on Rt lateral trunk, then to left sidelying working for cont focus to Rt axillo-inguinal anastomosis.   Passive ROM In Supine: In to flexion, abduction, and ER                        Long Term Clinic Goals - 03/18/17 0941      CC Long Term Goal  #1   Title Pt to be able to independently verbalize lymphedema risk reduction practices   Baseline 03/18/17- educated pt on this today   Time 4   Period Weeks   Status On-going     CC Long Term Goal  #2   Title Pt to be independent in a home exercise program for continued strengthening and stretching   Time 4   Period Weeks   Status On-going     CC Long Term Goal  #3   Title Pt to demonstrate 160 degrees of right shoulder abduction to allow her to reach items out to sides   Baseline 150, 03/18/17- 160 degrees with tightness   Time 4   Period Weeks   Status Achieved     CC Long Term Goal  #4   Title Pt to report a 75% decrease in pain in right UE with RUE movements to allow pt to return to prior level of function   Baseline 03/18/17- 20% decrease   Time 4   Period Weeks   Status On-going     CC Long Term Goal  #5   Title Pt to report a 75% improvement in edema in right lateral trunk to allow improved comfort   Baseline 03/18/17- 25% improvement   Time 4   Period Weeks   Status On-going            Plan - 03/30/17 1343    Clinical Impression Statement Pt reports feeling great gains through therapy thus far but would like to continue a few more weeks as she still has tightness at her end ROM and, though improved, palpable cording at inferior breast. Progressed ot to include AA/ROM today which she tolerated well.    Rehab Potential Good    Clinical Impairments Affecting Rehab Potential pt to begin radiation 03/22/17   PT Frequency 2x / week   PT Duration 4 weeks   PT Treatment/Interventions  Patient/family education;Therapeutic exercise;ADLs/Self Care Home Management;Therapeutic activities;Manual lymph drainage;Manual techniques;Compression bandaging;Scar mobilization;Passive range of motion;Taping;Orthotic Fit/Training   PT Next Visit Plan Renewal needed next visit. Cont AA/A/PROM to R shoulder, MLD R trunk, myofascial to cording in inferior right breast;    Consulted and Agree with Plan of Care Patient      Patient will benefit from skilled therapeutic intervention in order to improve the following deficits and impairments:  Pain, Increased fascial restricitons, Decreased scar mobility, Postural dysfunction, Impaired UE functional use, Decreased strength, Decreased range of motion, Increased edema, Decreased knowledge of precautions  Visit Diagnosis: Acute pain of right shoulder  Stiffness of right shoulder, not elsewhere classified  Localized edema     Problem List Patient Active Problem List   Diagnosis Date Noted  . Genetic testing 12/24/2016  . Persistent atrial fibrillation ([place]) 12/16/2016  . Long term current use of anticoagulant therapy 12/16/2016  . 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 Family history of breast cancer   . Melanoma ([place])   . 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 OA (osteoarthritis) of knee 01/29/2013    Otelia Limes, PTA 03/30/2017, 1:47 PM  [place], [place], [address] Phone: [phone]   Fax:  [phone]  Name: [name] MRN: [id number] Date of Birth: [birth year]

## 2017-03-31 ENCOUNTER — Ambulatory Visit
Admission: RE | Admit: 2017-03-31 | Discharge: 2017-03-31 | Disposition: A | Discharge status: Home / Self Care | Payer: Medicare Other | Source: Ambulatory Visit | Attending: Radiation Oncology | Admitting: Radiation Oncology

## 2017-03-31 DIAGNOSIS — C50211 Malignant neoplasm of upper-inner quadrant of right female breast: Secondary | ICD-10-CM | POA: Diagnosis not present

## 2017-04-01 ENCOUNTER — Ambulatory Visit
Admission: RE | Admit: 2017-04-01 | Discharge: 2017-04-01 | Disposition: A | Discharge status: Home / Self Care | Payer: Medicare Other | Source: Ambulatory Visit | Attending: Radiation Oncology | Admitting: Radiation Oncology

## 2017-04-01 ENCOUNTER — Encounter: Payer: Self-pay | Admitting: Physical Therapy

## 2017-04-01 ENCOUNTER — Ambulatory Visit: Payer: Medicare Other | Admitting: Physical Therapy

## 2017-04-01 DIAGNOSIS — M25611 Stiffness of right shoulder, not elsewhere classified: Secondary | ICD-10-CM

## 2017-04-01 DIAGNOSIS — C50211 Malignant neoplasm of upper-inner quadrant of right female breast: Secondary | ICD-10-CM | POA: Diagnosis not present

## 2017-04-01 DIAGNOSIS — M25511 Pain in right shoulder: Secondary | ICD-10-CM | POA: Diagnosis not present

## 2017-04-01 DIAGNOSIS — R293 Abnormal posture: Secondary | ICD-10-CM

## 2017-04-01 DIAGNOSIS — M6281 Muscle weakness (generalized): Secondary | ICD-10-CM

## 2017-04-01 DIAGNOSIS — R600 Localized edema: Secondary | ICD-10-CM

## 2017-04-01 NOTE — Therapy (Signed)
[facility] [address] [place], [place], [address] Phone: [phone]   Fax:  [phone]  Physical Therapy Treatment  Patient Details  Name: [name] MRN: [id number] Date of Birth: 1940-08-14 Referring Provider: Thimmappa  Encounter Date: 04/01/2017      PT End of Session - 04/01/17 1137    Visit Number 8   Number of Visits 17   Date for PT Re-Evaluation 04/29/17   PT Start Time 1022   PT Stop Time 1105   PT Time Calculation (min) 43 min   Activity Tolerance Patient tolerated treatment well   Behavior During Therapy WFL for tasks assessed/performed      Past Medical History:  Diagnosis Date  . Anemia    history of  . Anxiety   . Arthritis   . Asthma   . Breast cancer ([facility])   . Cancer ([facility])    skin cancers, one melanoma   . Complication of anesthesia    patient woke up during colonoscopy  . Concussion    8 yrs. ago due to being thrown from a horse  . Dislocated elbow 10/12/2016   left  . Dysrhythmia 12/2016   A-fib  . Family history of breast cancer   . Hypertension   . 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 Kidney stones    hx of   . Melanoma ([facility])   . Persistent atrial fibrillation ([facility]) 12/16/2016   CHA2DS2VASC score 3  . Pneumonia    hx of     Past Surgical History:  Procedure Laterality Date  . brachial skin removal due to deforimity Bilateral   . BREAST RECONSTRUCTION WITH PLACEMENT OF TISSUE EXPANDER AND FLEX HD (ACELLULAR HYDRATED DERMIS) Right 01/28/2017   Procedure: RIGHT BREAST RECONSTRUCTION WITH PLACEMENT OF TISSUE EXPANDER AND ALLODERM;  Surgeon: Glenna Fellows, MD;  [facility];  Service: Plastics;  Laterality: Right;  . BUNIONECTOMY     left   . CATARACT EXTRACTION     bilaterla cataract surgery   . COLONOSCOPY W/ POLYPECTOMY    . KNEE ARTHROSCOPY     left   . MASTECTOMY W/ SENTINEL NODE BIOPSY Right 01/28/2017   Procedure: RIGHT TOTAL MASTECTOMY WITH SENTINEL  LYMPH NODE BIOPSY;  Surgeon: Glenna Fellows, MD;  [facility];  Service: General;  Laterality: Right;  . MELANOMA EXCISION Left   . polyp removed from uterus     . right wrist pinning     . skin cancers removed    . TOTAL KNEE ARTHROPLASTY Right 01/29/2013   Procedure: RIGHT TOTAL KNEE ARTHROPLASTY;  Surgeon: Loanne Drilling, MD;  [facility];  Service: Orthopedics;  Laterality: Right;    There were no vitals filed for this visit.      Subjective Assessment - 04/01/17 1026    Subjective I am always sore after therapy but it makes a big difference the next day. It's magic.    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 It measures 3.5 cm and is located in the upper inner quadrant. It is ER/PR positive and HER2 negative. She had a traumatic fall 10/12/16 which resulted in a left elbow dislocation, She had a right total knee replacement in 2014 and has a left total knee replacement scheduled for 01/10/17., new onset of A-fib, underwent R mastectomy on 01/28/17, and SLNB all clear, pt has to complete radiation   Patient Stated Goals to be able to  move my arm without any pain   Currently in Pain? No/denies   Pain Score 0-No pain                         [facility] Adult PT Treatment/Exercise - 04/01/17 0001      Manual Therapy   Manual Therapy Manual Lymphatic Drainage (MLD);Passive ROM;Myofascial release   Myofascial Release Gentle UE pulling during P/ROM, to cording under right breast using cross friction and unidirectional stretch, gentle myofascial release to area of tightness in inner breast   Manual Lymphatic Drainage (MLD) In Supine: Short neck, superficial and deep abdominals, Rt inguinal nodes, Rt axillo-inguinal anastomosis, then focused on Rt lateral trunk, and R breast along scar and along medial portion    Passive ROM In Supine: In to flexion, abduction, and ER                         Long Term Clinic Goals - 04/01/17 1027      CC Long Term Goal  #1   Title Pt to be able to independently verbalize lymphedema risk reduction practices   Baseline 03/18/17- educated pt on this today, 04/01/17- pt able to independently verbalize these   Time 4   Period Weeks   Status Achieved     CC Long Term Goal  #2   Title Pt to be independent in a home exercise program for continued strengthening and stretching   Time 4   Period Weeks   Status On-going     CC Long Term Goal  #3   Title Pt to demonstrate 160 degrees of right shoulder abduction to allow her to reach items out to sides   Baseline 150, 03/18/17- 160 degrees with tightness   Period Weeks   Status Achieved     CC Long Term Goal  #4   Title Pt to report a 75% decrease in pain in right UE with RUE movements to allow pt to return to prior level of function   Baseline 03/18/17- 20% decrease, 04/01/17- 70% improvement   Time 4   Period Weeks   Status On-going     CC Long Term Goal  #5   Title Pt to report a 75% improvement in edema in right lateral trunk to allow improved comfort   Baseline 03/18/17- 25% improvement, 04/01/17- 65%   Time 4   Period Weeks   Status On-going            Plan - 04/01/17 1138    Clinical Impression Statement Assessed pt's progress towards all goals in therapy. She is progressing towards all goals and is demonstrating decreasing swelling and tightness. She still has cording in inferior right breast but it is not as palpable as it was several weeks ago. She would benefit from continued skilled PT services to continue to progress her towards her goals in therapy including decreased tightness, decreasing cording, decreasing discomfort and edema and increasing bilateral shoulder strength.    Rehab Potential Good   Clinical Impairments Affecting Rehab Potential pt to begin radiation 03/22/17   PT Frequency 2x / week   PT Duration 4 weeks   PT Treatment/Interventions Patient/family  education;Therapeutic exercise;ADLs/Self Care Home Management;Therapeutic activities;Manual lymph drainage;Manual techniques;Compression bandaging;Scar mobilization;Passive range of motion;Taping;Orthotic Fit/Training   PT Next Visit Plan  Cont AA/A/PROM to R shoulder, MLD R trunk, myofascial to cording in inferior right breast;    Consulted and Agree with Plan of Care  Patient      Patient will benefit from skilled therapeutic intervention in order to improve the following deficits and impairments:  Pain, Increased fascial restricitons, Decreased scar mobility, Postural dysfunction, Impaired UE functional use, Decreased strength, Decreased range of motion, Increased edema, Decreased knowledge of precautions  Visit Diagnosis: Acute pain of right shoulder - Plan: PT plan of care cert/re-cert  Stiffness of right shoulder, not elsewhere classified - Plan: PT plan of care cert/re-cert  Localized edema - Plan: PT plan of care cert/re-cert  Muscle weakness (generalized) - Plan: PT plan of care cert/re-cert  Abnormal posture - Plan: PT plan of care cert/re-cert     Problem List Patient Active Problem List   Diagnosis Date Noted  . Genetic testing 12/24/2016  . Persistent atrial fibrillation ([place]) 12/16/2016  . Long term current use of anticoagulant therapy 12/16/2016  . 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 Family history of breast cancer   . Melanoma ([place])   . 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 OA (osteoarthritis) of knee 01/29/2013    Allyson Sabal [facility] 04/01/2017, 11:53 AM  [place], [place], [address] Phone: [phone]   Fax:  [phone]  Name: [name] MRN: [id number] Date of Birth: 11-Feb-1941  Manus Gunning, PT 04/01/17 11:53 AM

## 2017-04-04 ENCOUNTER — Ambulatory Visit: Payer: Medicare Other | Admitting: Physical Therapy

## 2017-04-04 ENCOUNTER — Encounter: Payer: Self-pay | Admitting: Physical Therapy

## 2017-04-04 ENCOUNTER — Ambulatory Visit
Admission: RE | Admit: 2017-04-04 | Discharge: 2017-04-04 | Disposition: A | Discharge status: Home / Self Care | Payer: Medicare Other | Source: Ambulatory Visit | Attending: Radiation Oncology | Admitting: Radiation Oncology

## 2017-04-04 DIAGNOSIS — C50211 Malignant neoplasm of upper-inner quadrant of right female breast: Secondary | ICD-10-CM | POA: Diagnosis not present

## 2017-04-04 DIAGNOSIS — M25511 Pain in right shoulder: Secondary | ICD-10-CM

## 2017-04-04 DIAGNOSIS — M25611 Stiffness of right shoulder, not elsewhere classified: Secondary | ICD-10-CM

## 2017-04-04 DIAGNOSIS — R293 Abnormal posture: Secondary | ICD-10-CM

## 2017-04-04 DIAGNOSIS — M6281 Muscle weakness (generalized): Secondary | ICD-10-CM

## 2017-04-04 DIAGNOSIS — R600 Localized edema: Secondary | ICD-10-CM

## 2017-04-04 NOTE — Therapy (Signed)
[name], [place], [address] Phone: [phone]   Fax:  [phone]  Physical Therapy Treatment  Patient Details  Name: [name] MRN: [id number] Date of Birth: June 04, 1941 Referring Provider: Thimmappa  Encounter Date: 04/04/2017      PT End of Session - 04/04/17 1435    Visit Number 9   Number of Visits 17   Date for PT Re-Evaluation 04/29/17   PT Start Time 1324   PT Stop Time 4010   PT Time Calculation (min) 44 min   Activity Tolerance Patient tolerated treatment well   Behavior During Therapy [facility] for tasks assessed/performed      Past Medical History:  Diagnosis Date  . Anemia    history of  . Anxiety   . Arthritis   . Asthma   . Breast cancer ([place])   . Cancer ([place])    skin cancers, one melanoma   . Complication of anesthesia    patient woke up during colonoscopy  . Concussion    8 yrs. ago due to being thrown from a horse  . Dislocated elbow 10/12/2016   left  . Dysrhythmia 12/2016   A-fib  . Family history of breast cancer   . Hypertension   . 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 Kidney stones    hx of   . Melanoma ([place])   . Persistent atrial fibrillation ([place]) 12/16/2016   CHA2DS2VASC score 3  . Pneumonia    hx of     Past Surgical History:  Procedure Laterality Date  . brachial skin removal due to deforimity Bilateral   . BREAST RECONSTRUCTION WITH PLACEMENT OF TISSUE EXPANDER AND FLEX HD (ACELLULAR HYDRATED DERMIS) Right 01/28/2017   Procedure: RIGHT BREAST RECONSTRUCTION WITH PLACEMENT OF TISSUE EXPANDER AND ALLODERM;  Surgeon: [name], MD;  Location: [place];  Service: Plastics;  Laterality: Right;  . BUNIONECTOMY     left   . CATARACT EXTRACTION     bilaterla cataract surgery   . COLONOSCOPY W/ POLYPECTOMY    . KNEE ARTHROSCOPY     left   . MASTECTOMY W/ SENTINEL NODE BIOPSY Right 01/28/2017   Procedure: RIGHT TOTAL MASTECTOMY WITH SENTINEL  LYMPH NODE BIOPSY;  Surgeon: Excell Seltzer, MD;  Location: [place];  Service: General;  Laterality: Right;  . MELANOMA EXCISION Left   . polyp removed from uterus     . right wrist pinning     . skin cancers removed    . TOTAL KNEE ARTHROPLASTY Right 01/29/2013   Procedure: RIGHT TOTAL KNEE ARTHROPLASTY;  Surgeon: Gearlean Alf, MD;  [facility];  Service: Orthopedics;  Laterality: Right;    There were no vitals filed for this visit.      Subjective Assessment - 04/04/17 1346    Subjective I have a little discomfort in my shoulder after exercise class this morning.    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 It measures 3.5 cm and is located in the upper inner quadrant. It is ER/PR positive and HER2 negative. She had a traumatic fall 10/12/16 which resulted in a left elbow dislocation, She had a right total knee replacement in 2014 and has a left total knee replacement scheduled for 01/10/17., new onset of A-fib, underwent R mastectomy on 01/28/17, and SLNB all clear, pt has to complete radiation   Patient Stated Goals to be able to move my arm without  any pain   Currently in Pain? Yes   Pain Score 6    Pain Location Shoulder   Pain Orientation Right   Pain Descriptors / Indicators Discomfort   Pain Type Acute pain   Pain Onset Today   Pain Frequency Constant                         [facility] Adult PT Treatment/Exercise - 04/04/17 0001      Shoulder Exercises: Pulleys   Flexion 2 minutes   ABduction 2 minutes     Shoulder Exercises: Therapy Ball   Flexion 10 reps  with stretch at end   ABduction 10 reps  with stretch at end range     Manual Therapy   Manual Therapy Manual Lymphatic Drainage (MLD);Passive ROM;Myofascial release   Myofascial Release Gentle UE pulling during P/ROM, to cording under right breast using cross friction and unidirectional stretch, gentle myofascial release to area of  tightness in inner breast   Manual Lymphatic Drainage (MLD) In Supine: Short neck, superficial and deep abdominals, Rt inguinal nodes, Rt axillo-inguinal anastomosis, then focused on Rt lateral trunk, and R breast along scar and along medial portion    Passive ROM In Supine: In to flexion, abduction, and ER                        [facility] Clinic Goals - 04/01/17 1027      CC Long Term Goal  #1   Title Pt to be able to independently verbalize lymphedema risk reduction practices   Baseline 03/18/17- educated pt on this today, 04/01/17- pt able to independently verbalize these   Time 4   Period Weeks   Status Achieved     CC Long Term Goal  #2   Title Pt to be independent in a home exercise program for continued strengthening and stretching   Time 4   Period Weeks   Status On-going     CC Long Term Goal  #3   Title Pt to demonstrate 160 degrees of right shoulder abduction to allow her to reach items out to sides   Baseline 150, 03/18/17- 160 degrees with tightness   Period Weeks   Status Achieved     CC Long Term Goal  #4   Title Pt to report a 75% decrease in pain in right UE with RUE movements to allow pt to return to prior level of function   Baseline 03/18/17- 20% decrease, 04/01/17- 70% improvement   Time 4   Period Weeks   Status On-going     CC Long Term Goal  #5   Title Pt to report a 75% improvement in edema in right lateral trunk to allow improved comfort   Baseline 03/18/17- 25% improvement, 04/01/17- 65%   Time 4   Period Weeks   Status On-going            Plan - 04/04/17 1435    Clinical Impression Statement Continued with AAROM exercises today to help improve ROM and stretch cording in inferior breast. Pt did well with these. Continued with MLD to right breast and stretching to reduce cording. Cords were slightly more prevalent today and three could be palpated.    Rehab Potential Good   Clinical Impairments Affecting Rehab Potential pt to  begin radiation 03/22/17   PT Frequency 2x / week   PT Duration 4 weeks   PT Treatment/Interventions Patient/family education;Therapeutic exercise;ADLs/Self Care  Home Management;Therapeutic activities;Manual lymph drainage;Manual techniques;Compression bandaging;Scar mobilization;Passive range of motion;Taping;Orthotic Fit/Training   PT Next Visit Plan  Cont AA/A/PROM to R shoulder, MLD R trunk, myofascial to cording in inferior right breast;    Consulted and Agree with Plan of Care Patient      Patient will benefit from skilled therapeutic intervention in order to improve the following deficits and impairments:  Pain, Increased fascial restricitons, Decreased scar mobility, Postural dysfunction, Impaired UE functional use, Decreased strength, Decreased range of motion, Increased edema, Decreased knowledge of precautions  Visit Diagnosis: Acute pain of right shoulder  Stiffness of right shoulder, not elsewhere classified  Localized edema  Muscle weakness (generalized)  Abnormal posture     Problem List Patient Active Problem List   Diagnosis Date Noted  . Genetic testing 12/24/2016  . Persistent atrial fibrillation ([place]) 12/16/2016  . Long term current use of anticoagulant therapy 12/16/2016  . 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 Family history of breast cancer   . Melanoma ([place])   . Malignant neoplasm of upper-inner quadrant of right breast in female, estrogen receptor positive ([place]) 11/19/2016  . OA (osteoarthritis) of knee 01/29/2013    Allyson Sabal [facility] 04/04/2017, 2:38 PM  [place], [place], [address] Phone: [phone]   Fax:  [phone]  Name: MADALENA KESECKER MRN: [id number] Date of Birth: [birth year]  Manus Gunning, PT 04/04/17 2:38 PM

## 2017-04-05 ENCOUNTER — Ambulatory Visit
Admission: RE | Admit: 2017-04-05 | Discharge: 2017-04-05 | Disposition: A | Discharge status: Home / Self Care | Payer: Medicare Other | Source: Ambulatory Visit | Attending: Radiation Oncology | Admitting: Radiation Oncology

## 2017-04-05 ENCOUNTER — Other Ambulatory Visit: Payer: Self-pay | Admitting: Radiation Oncology

## 2017-04-05 DIAGNOSIS — C50211 Malignant neoplasm of upper-inner quadrant of right female breast: Secondary | ICD-10-CM

## 2017-04-05 DIAGNOSIS — Z170 Estrogen receptor positive status [ER+]: Principal | ICD-10-CM

## 2017-04-05 MED ORDER — OXYCODONE HCL 5 MG PO TABS: 5.0000 mg | ORAL_TABLET | ORAL | 0 refills | Status: DC | PRN

## 2017-04-06 ENCOUNTER — Ambulatory Visit
Admission: RE | Admit: 2017-04-06 | Discharge: 2017-04-06 | Disposition: A | Discharge status: Home / Self Care | Payer: Medicare Other | Source: Ambulatory Visit | Attending: Radiation Oncology | Admitting: Radiation Oncology

## 2017-04-06 ENCOUNTER — Other Ambulatory Visit: Payer: Self-pay | Admitting: *Deleted

## 2017-04-06 ENCOUNTER — Ambulatory Visit: Payer: Medicare Other | Admitting: Physical Therapy

## 2017-04-06 ENCOUNTER — Encounter: Payer: Self-pay | Admitting: Physical Therapy

## 2017-04-06 DIAGNOSIS — R600 Localized edema: Secondary | ICD-10-CM

## 2017-04-06 DIAGNOSIS — M25611 Stiffness of right shoulder, not elsewhere classified: Secondary | ICD-10-CM

## 2017-04-06 DIAGNOSIS — C50211 Malignant neoplasm of upper-inner quadrant of right female breast: Secondary | ICD-10-CM | POA: Diagnosis not present

## 2017-04-06 DIAGNOSIS — M25511 Pain in right shoulder: Secondary | ICD-10-CM

## 2017-04-06 MED ORDER — VENLAFAXINE HCL ER 75 MG PO CP24: 75.0000 mg | ORAL_CAPSULE | Freq: Every day | ORAL | 2 refills | Status: DC

## 2017-04-06 NOTE — Therapy (Signed)
[place], [place], [address] Phone: [phone]   Fax:  [phone]  Physical Therapy Treatment  Patient Details  Name: [name] MRN: [id number] Date of Birth: 1941/01/11 Referring Provider: Thimmappa  Encounter Date: 04/06/2017      PT End of Session - 04/06/17 1403    Visit Number 10   Number of Visits 17   Date for PT Re-Evaluation 04/29/17   PT Start Time 1302   PT Stop Time 1771   PT Time Calculation (min) 45 min   Activity Tolerance Patient tolerated treatment well   Behavior During Therapy [facility] for tasks assessed/performed      Past Medical History:  Diagnosis Date  . Anemia    history of  . Anxiety   . Arthritis   . Asthma   . Breast cancer ([place])   . Cancer ([place])    skin cancers, one melanoma   . Complication of anesthesia    patient woke up during colonoscopy  . Concussion    8 yrs. ago due to being thrown from a horse  . Dislocated elbow 10/12/2016   left  . Dysrhythmia 12/2016   A-fib  . Family history of breast cancer   . Hypertension   . 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 Kidney stones    hx of   . Melanoma ([place])   . Persistent atrial fibrillation ([place]) 12/16/2016   CHA2DS2VASC score 3  . Pneumonia    hx of     Past Surgical History:  Procedure Laterality Date  . brachial skin removal due to deforimity Bilateral   . BREAST RECONSTRUCTION WITH PLACEMENT OF TISSUE EXPANDER AND FLEX HD (ACELLULAR HYDRATED DERMIS) Right 01/28/2017   Procedure: RIGHT BREAST RECONSTRUCTION WITH PLACEMENT OF TISSUE EXPANDER AND ALLODERM;  Surgeon: [name], MD;  Location: [place];  Service: Plastics;  Laterality: Right;  . BUNIONECTOMY     left   . CATARACT EXTRACTION     bilaterla cataract surgery   . COLONOSCOPY W/ POLYPECTOMY    . KNEE ARTHROSCOPY     left   . MASTECTOMY W/ SENTINEL NODE BIOPSY Right 01/28/2017   Procedure: RIGHT TOTAL MASTECTOMY WITH  SENTINEL LYMPH NODE BIOPSY;  Surgeon: Excell Seltzer, MD;  Location: [place];  Service: General;  Laterality: Right;  . MELANOMA EXCISION Left   . polyp removed from uterus     . right wrist pinning     . skin cancers removed    . TOTAL KNEE ARTHROPLASTY Right 01/29/2013   Procedure: RIGHT TOTAL KNEE ARTHROPLASTY;  Surgeon: Gearlean Alf, MD;  [facility];  Service: Orthopedics;  Laterality: Right;    There were no vitals filed for this visit.      Subjective Assessment - 04/06/17 1303    Subjective I could not sleep Monday night because of my neck. Tuesday the doctor prescribed me some medicine and it is a lot better now.    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 It measures 3.5 cm and is located in the upper inner quadrant. It is ER/PR positive and HER2 negative. She had a traumatic fall 10/12/16 which resulted in a left elbow dislocation, She had a right total knee replacement in 2014 and has a left total knee replacement scheduled for 01/10/17., new onset of A-fib, underwent R mastectomy on 01/28/17, and SLNB all clear, pt has to complete radiation  Patient Stated Goals to be able to move my arm without any pain   Currently in Pain? No/denies   Pain Score 0-No pain                         [facility] Adult PT Treatment/Exercise - 04/06/17 0001      Shoulder Exercises: Pulleys   Flexion 2 minutes   ABduction 2 minutes     Manual Therapy   Manual Therapy Manual Lymphatic Drainage (MLD);Myofascial release;Soft tissue mobilization;Passive ROM   Soft tissue mobilization to left upper trap in area of tightness - pt unable to do ball up wall exercise due to increased tightness in this area, numerous areas with rope like texture noted with some relaxation post massage   Myofascial Release Gentle UE pulling during P/ROM, to cording under right breast using cross friction and unidirectional stretch, gentle  myofascial release to area of tightness in inner breast   Manual Lymphatic Drainage (MLD) In Supine: Short neck, superficial and deep abdominals, Rt inguinal nodes, Rt axillo-inguinal anastomosis, then focused on Rt lateral trunk, and R breast along scar and along medial portion    Passive ROM In Supine: In to flexion, abduction, and ER                        Long Term Clinic Goals - 04/01/17 1027      CC Long Term Goal  #1   Title Pt to be able to independently verbalize lymphedema risk reduction practices   Baseline 03/18/17- educated pt on this today, 04/01/17- pt able to independently verbalize these   Time 4   Period Weeks   Status Achieved     CC Long Term Goal  #2   Title Pt to be independent in a home exercise program for continued strengthening and stretching   Time 4   Period Weeks   Status On-going     CC Long Term Goal  #3   Title Pt to demonstrate 160 degrees of right shoulder abduction to allow her to reach items out to sides   Baseline 150, 03/18/17- 160 degrees with tightness   Period Weeks   Status Achieved     CC Long Term Goal  #4   Title Pt to report a 75% decrease in pain in right UE with RUE movements to allow pt to return to prior level of function   Baseline 03/18/17- 20% decrease, 04/01/17- 70% improvement   Time 4   Period Weeks   Status On-going     CC Long Term Goal  #5   Title Pt to report a 75% improvement in edema in right lateral trunk to allow improved comfort   Baseline 03/18/17- 25% improvement, 04/01/17- 65%   Time 4   Period Weeks   Status On-going            Plan - 04/06/17 1406    Clinical Impression Statement Pt had increased upper shoulder and neck pain today. She thinks it started from pushing too hard during exercises. Did soft tissue work to left upper traps in areas of rope like texture. Pt demonstrate relaxation after this. Pt demonstrate full PROM this session. Cording is still palpable in inferior breast.  Will begin more strengthening exercises next session.    Rehab Potential Good   Clinical Impairments Affecting Rehab Potential pt to begin radiation 03/22/17   PT Frequency 2x / week   PT Duration 4 weeks  PT Treatment/Interventions Patient/family education;Therapeutic exercise;ADLs/Self Care Home Management;Therapeutic activities;Manual lymph drainage;Manual techniques;Compression bandaging;Scar mobilization;Passive range of motion;Taping;Orthotic Fit/Training   PT Next Visit Plan add supine scap exercises, Cont AA/A/PROM to R shoulder, MLD R trunk, myofascial to cording in inferior right breast;    Consulted and Agree with Plan of Care Patient      Patient will benefit from skilled therapeutic intervention in order to improve the following deficits and impairments:  Pain, Increased fascial restricitons, Decreased scar mobility, Postural dysfunction, Impaired UE functional use, Decreased strength, Decreased range of motion, Increased edema, Decreased knowledge of precautions  Visit Diagnosis: Acute pain of right shoulder  Stiffness of right shoulder, not elsewhere classified  Localized edema       G-Codes - 05/05/17 1404    Functional Assessment Tool Used (Outpatient Only) Clinical judgement   Functional Limitation Carrying, moving and handling objects   Carrying, Moving and Handling Objects Current Status (M2500) At least 20 percent but less than 40 percent impaired, limited or restricted   Carrying, Moving and Handling Objects Goal Status (B7048) At least 1 percent but less than 20 percent impaired, limited or restricted      Problem List Patient Active Problem List   Diagnosis Date Noted  . Genetic testing 12/24/2016  . Persistent atrial fibrillation ([place]) 12/16/2016  . Long term current use of anticoagulant therapy 12/16/2016  . 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 Family history of breast cancer   . Melanoma ([place])   . Malignant neoplasm of upper-inner quadrant of  right breast in female, estrogen receptor positive ([place]) 11/19/2016  . OA (osteoarthritis) of knee 01/29/2013    Allyson Sabal [facility] May 05, 2017, 2:31 PM  [place], [place], [address] Phone: [phone]   Fax:  [phone]  Name: [name] MRN: [id number] Date of Birth: March 23, 1941  Manus Gunning, PT May 05, 2017 2:31 PM

## 2017-04-07 ENCOUNTER — Ambulatory Visit
Admission: RE | Admit: 2017-04-07 | Discharge: 2017-04-07 | Disposition: A | Discharge status: Home / Self Care | Payer: Medicare Other | Source: Ambulatory Visit | Attending: Radiation Oncology | Admitting: Radiation Oncology

## 2017-04-07 DIAGNOSIS — C50211 Malignant neoplasm of upper-inner quadrant of right female breast: Secondary | ICD-10-CM | POA: Diagnosis not present

## 2017-04-08 ENCOUNTER — Ambulatory Visit
Admission: RE | Admit: 2017-04-08 | Discharge: 2017-04-08 | Disposition: A | Discharge status: Home / Self Care | Payer: Medicare Other | Source: Ambulatory Visit | Attending: Radiation Oncology | Admitting: Radiation Oncology

## 2017-04-08 DIAGNOSIS — C50211 Malignant neoplasm of upper-inner quadrant of right female breast: Secondary | ICD-10-CM | POA: Diagnosis not present

## 2017-04-11 ENCOUNTER — Ambulatory Visit
Admission: RE | Admit: 2017-04-11 | Discharge: 2017-04-11 | Disposition: A | Discharge status: Home / Self Care | Payer: Medicare Other | Source: Ambulatory Visit | Attending: Radiation Oncology | Admitting: Radiation Oncology

## 2017-04-11 DIAGNOSIS — C50211 Malignant neoplasm of upper-inner quadrant of right female breast: Secondary | ICD-10-CM | POA: Diagnosis not present

## 2017-04-12 ENCOUNTER — Ambulatory Visit: Payer: Medicare Other

## 2017-04-13 ENCOUNTER — Ambulatory Visit
Admission: RE | Admit: 2017-04-13 | Discharge: 2017-04-13 | Disposition: A | Discharge status: Home / Self Care | Payer: Medicare Other | Source: Ambulatory Visit | Attending: Radiation Oncology | Admitting: Radiation Oncology

## 2017-04-13 ENCOUNTER — Ambulatory Visit: Payer: Medicare Other | Attending: Plastic Surgery

## 2017-04-13 DIAGNOSIS — M25611 Stiffness of right shoulder, not elsewhere classified: Secondary | ICD-10-CM | POA: Diagnosis present

## 2017-04-13 DIAGNOSIS — M62838 Other muscle spasm: Secondary | ICD-10-CM | POA: Insufficient documentation

## 2017-04-13 DIAGNOSIS — R600 Localized edema: Secondary | ICD-10-CM | POA: Insufficient documentation

## 2017-04-13 DIAGNOSIS — C50211 Malignant neoplasm of upper-inner quadrant of right female breast: Secondary | ICD-10-CM | POA: Diagnosis not present

## 2017-04-13 DIAGNOSIS — R293 Abnormal posture: Secondary | ICD-10-CM | POA: Diagnosis present

## 2017-04-13 DIAGNOSIS — M6281 Muscle weakness (generalized): Secondary | ICD-10-CM | POA: Diagnosis present

## 2017-04-13 DIAGNOSIS — M25511 Pain in right shoulder: Secondary | ICD-10-CM | POA: Insufficient documentation

## 2017-04-13 NOTE — Patient Instructions (Signed)

## 2017-04-13 NOTE — Therapy (Signed)
[place], [place], [address] Phone: [phone]   Fax:  [phone]  Physical Therapy Treatment  Patient Details  Name: [name] MRN: [id number] Date of Birth: 09-Feb-1941 Referring Provider: Thimmappa   Encounter Date: 04/13/2017  PT End of Session - 04/13/17 1201    Visit Number  11    Number of Visits  17    Date for PT Re-Evaluation  04/29/17    PT Start Time  1105    PT Stop Time  1155    PT Time Calculation (min)  50 min    Activity Tolerance  Patient tolerated treatment well    Behavior During Therapy  WFL for tasks assessed/performed       Past Medical History:  Diagnosis Date  . Anemia    history of  . Anxiety   . Arthritis   . Asthma   . Breast cancer ([place])   . Cancer ([place])    skin cancers, one melanoma   . Complication of anesthesia    patient woke up during colonoscopy  . Concussion    8 yrs. ago due to being thrown from a horse  . Dislocated elbow 10/12/2016   left  . Dysrhythmia 12/2016   A-fib  . Family history of breast cancer   . Hypertension   . 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 Kidney stones    hx of   . Melanoma ([place])   . Persistent atrial fibrillation ([place]) 12/16/2016   CHA2DS2VASC score 3  . Pneumonia    hx of     Past Surgical History:  Procedure Laterality Date  . brachial skin removal due to deforimity Bilateral   . BUNIONECTOMY     left   . CATARACT EXTRACTION     bilaterla cataract surgery   . COLONOSCOPY W/ POLYPECTOMY    . KNEE ARTHROSCOPY     left   . MELANOMA EXCISION Left   . polyp removed from uterus     . right wrist pinning     . skin cancers removed      There were no vitals filed for this visit.  Subjective Assessment - 04/13/17 1110    Subjective  I felt really good after last visit, it really has calmed down since then (neck pain). 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 It measures 3.5 cm and is located in the upper inner quadrant. It is ER/PR positive and HER2 negative. She had a traumatic fall 10/12/16 which resulted in a left elbow dislocation, She had a right total knee replacement in 2014 and has a left total knee replacement scheduled for 01/10/17., new onset of A-fib, underwent R mastectomy on 01/28/17, and SLNB all clear, pt has to complete radiation    Patient Stated Goals  to be able to move my arm without any pain    Currently in Pain?  No/denies                      [facility] Adult PT Treatment/Exercise - 04/13/17 0001      Shoulder Exercises: Supine   Horizontal ABduction  Strengthening;Both;Theraband;10 reps    Theraband Level (Shoulder Horizontal ABduction)  Level 2 (Red)    External Rotation  Strengthening;Both;10 reps;Theraband  Theraband Level (Shoulder External Rotation)  Level 2 (Red)    Flexion  Strengthening;Both;5 reps;Theraband Narrow and Wide Grip, 5 times each   Narrow and Wide Grip, 5 times each   Theraband Level (Shoulder Flexion)  Level 2 (Red)    Other Supine Exercises  Bil D2 with red theraband 5 times each side with pt returning correct therapist demonstration      Manual Therapy   Manual Therapy  Manual Lymphatic Drainage (MLD);Myofascial release;Passive ROM    Myofascial Release  Gentle UE pulling during P/ROM, to cording under right breast using cross friction and unidirectional stretch, gentle myofascial release to area of tightness in inner breast    Manual Lymphatic Drainage (MLD)  In Supine: Short neck, superficial and deep abdominals, Rt inguinal nodes, Rt axillo-inguinal anastomosis, then focused on Rt lateral trunk, and R breast along scar and along medial portion     Passive ROM  In Supine: In to flexion, abduction, and ER to Rt shoulder             PT  Education - 04/13/17 1119    Education provided  Yes    Education Details  Supine scapular series with red theraband    Person(s) Educated  Patient    Methods  Explanation;Demonstration;Handout    Comprehension  Verbalized understanding;Returned demonstration             [facility] Goals - 04/01/17 1027      CC Long Term Goal  #1   Title  Pt to be able to independently verbalize lymphedema risk reduction practices    Baseline  03/18/17- educated pt on this today, 04/01/17- pt able to independently verbalize these    Time  4    Period  Weeks    Status  Achieved      CC Long Term Goal  #2   Title  Pt to be independent in a home exercise program for continued strengthening and stretching    Time  4    Period  Weeks    Status  On-going      CC Long Term Goal  #3   Title  Pt to demonstrate 160 degrees of right shoulder abduction to allow her to reach items out to sides    Baseline  150, 03/18/17- 160 degrees with tightness    Period  Weeks    Status  Achieved      CC Long Term Goal  #4   Title  Pt to report a 75% decrease in pain in right UE with RUE movements to allow pt to return to prior level of function    Baseline  03/18/17- 20% decrease, 04/01/17- 70% improvement    Time  4    Period  Weeks    Status  On-going      CC Long Term Goal  #5   Title  Pt to report a 75% improvement in edema in right lateral trunk to allow improved comfort    Baseline  03/18/17- 25% improvement, 04/01/17- 65%    Time  4    Period  Weeks    Status  On-going         Plan - 04/13/17 1203    Clinical Impression Statement  Pt had done very well since being here a week ago reporting muscle spasm flare up gone and even stood all day at the polls volunteering yesterday and only complaint today was minor increased Rt chest tightness, which she felt was alleviated after  todays session. Cording at Rt inferior breast much improved since this therapist saw her last. Only one cord palpable  today and not very prominent. Overall pt is progressing very well and she is pleased with her results so far, especially while undergoing radiation.    Rehab Potential  Good    Clinical Impairments Affecting Rehab Potential  pt to begin radiation 03/22/17    PT Frequency  2x / week    PT Duration  4 weeks    PT Treatment/Interventions  Patient/family education;Therapeutic exercise;ADLs/Self Care Home Management;Therapeutic activities;Manual lymph drainage;Manual techniques;Compression bandaging;Scar mobilization;Passive range of motion;Taping;Orthotic Fit/Training    PT Next Visit Plan  Review supine scap exercises, Cont AA/A/PROM to Rt shoulder, MLD R trunk, myofascial to cording in inferior right breast;     Consulted and Agree with Plan of Care  Patient       Patient will benefit from skilled therapeutic intervention in order to improve the following deficits and impairments:  Pain, Increased fascial restricitons, Decreased scar mobility, Postural dysfunction, Impaired UE functional use, Decreased strength, Decreased range of motion, Increased edema, Decreased knowledge of precautions  Visit Diagnosis: Acute pain of right shoulder  Stiffness of right shoulder, not elsewhere classified  Localized edema  Muscle weakness (generalized)  Abnormal posture     Problem List Patient Active Problem List   Diagnosis Date Noted  . Genetic testing 12/24/2016  . Persistent atrial fibrillation ([place]) 12/16/2016  . Long term current use of anticoagulant therapy 12/16/2016  . 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 Family history of breast cancer   . Melanoma ([place])   . Malignant neoplasm of upper-inner quadrant of right breast in female, estrogen receptor positive ([place]) 11/19/2016  . OA (osteoarthritis) of knee 01/29/2013    Otelia Limes, PTA 04/13/2017, 12:13 PM  [place], [place],  [address] Phone: [phone]   Fax:  [phone]  Name: [name] MRN: [id number] Date of Birth: August 18, 1940

## 2017-04-14 ENCOUNTER — Ambulatory Visit
Admission: RE | Admit: 2017-04-14 | Discharge: 2017-04-14 | Disposition: A | Discharge status: Home / Self Care | Payer: Medicare Other | Source: Ambulatory Visit | Attending: Radiation Oncology | Admitting: Radiation Oncology

## 2017-04-14 DIAGNOSIS — C50211 Malignant neoplasm of upper-inner quadrant of right female breast: Secondary | ICD-10-CM | POA: Diagnosis not present

## 2017-04-15 ENCOUNTER — Ambulatory Visit
Admission: RE | Admit: 2017-04-15 | Discharge: 2017-04-15 | Disposition: A | Discharge status: Home / Self Care | Payer: Medicare Other | Source: Ambulatory Visit | Attending: Radiation Oncology | Admitting: Radiation Oncology

## 2017-04-15 ENCOUNTER — Other Ambulatory Visit: Payer: Self-pay

## 2017-04-15 ENCOUNTER — Ambulatory Visit: Payer: Medicare Other | Admitting: Physical Therapy

## 2017-04-15 ENCOUNTER — Encounter: Payer: Self-pay | Admitting: Physical Therapy

## 2017-04-15 DIAGNOSIS — M25511 Pain in right shoulder: Secondary | ICD-10-CM

## 2017-04-15 DIAGNOSIS — R293 Abnormal posture: Secondary | ICD-10-CM

## 2017-04-15 DIAGNOSIS — M6281 Muscle weakness (generalized): Secondary | ICD-10-CM

## 2017-04-15 DIAGNOSIS — C50211 Malignant neoplasm of upper-inner quadrant of right female breast: Secondary | ICD-10-CM | POA: Diagnosis not present

## 2017-04-15 DIAGNOSIS — M62838 Other muscle spasm: Secondary | ICD-10-CM

## 2017-04-15 DIAGNOSIS — R600 Localized edema: Secondary | ICD-10-CM

## 2017-04-15 DIAGNOSIS — M25611 Stiffness of right shoulder, not elsewhere classified: Secondary | ICD-10-CM

## 2017-04-15 NOTE — Therapy (Signed)
[place], [place], [address] Phone: [phone]   Fax:  [phone]  Physical Therapy Treatment  Patient Details  Name: [name] MRN: [id number] Date of Birth: [birth year] Referring Provider: [place]   Encounter Date: 04/15/2017  PT End of Session - 04/15/17 1201    Visit Number  12    Number of Visits  17    Date for PT Re-Evaluation  04/29/17    PT Start Time  1105    PT Stop Time  1145    PT Time Calculation (min)  40 min    Activity Tolerance  Patient tolerated treatment well    Behavior During Therapy  WFL for tasks assessed/performed       Past Medical History:  Diagnosis Date  . Anemia    history of  . Anxiety   . Arthritis   . Asthma   . Breast cancer ([place])   . Cancer ([place])    skin cancers, one melanoma   . Complication of anesthesia    patient woke up during colonoscopy  . Concussion    8 yrs. ago due to being thrown from a horse  . Dislocated elbow 10/12/2016   left  . Dysrhythmia 12/2016   A-fib  . Family history of breast cancer   . Hypertension   . 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 Kidney stones    hx of   . Melanoma ([place])   . Persistent atrial fibrillation ([place]) 12/16/2016   CHA2DS2VASC score 3  . Pneumonia    hx of     Past Surgical History:  Procedure Laterality Date  . brachial skin removal due to deforimity Bilateral   . BUNIONECTOMY     left   . CATARACT EXTRACTION     bilaterla cataract surgery   . COLONOSCOPY W/ POLYPECTOMY    . KNEE ARTHROSCOPY     left   . MELANOMA EXCISION Left   . polyp removed from uterus     . right wrist pinning     . skin cancers removed      There were no vitals filed for this visit.  Subjective Assessment - 04/15/17 1107    Subjective  Pt says she has noticed some beginning inflammation afer radiation.  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 It measures 3.5 cm and is located in the upper inner quadrant. It is ER/PR positive and HER2 negative. She had a traumatic fall 10/12/16 which resulted in a left elbow dislocation, She had a right total knee replacement in 2014 and has a left total knee replacement scheduled for 01/10/17., new onset of A-fib, underwent R mastectomy on 01/28/17, and SLNB all clear, pt has to complete radiation    Patient Stated Goals  to be able to move my arm without any pain    Currently in Pain?  No/denies                      [facility] Adult PT Treatment/Exercise - 04/15/17 0001      Exercises   Exercises  Other Exercises    Other Exercises   pt reports she is doing the band exercises well with a trainer and has been going to [employer] at leasat 4 times a week  Manual Therapy   Manual Therapy  Manual Lymphatic Drainage (MLD);Myofascial release;Passive ROM    Myofascial Release  to infererior  left breast cording with arm placed overhead on pillow     Manual Lymphatic Drainage (MLD)  in supine, deep abdominals, left inguinal nodes and left axillo-inguinal anastamosis,  then to left sidelying for posteior interaxillary anastamosis, left back and lateral chest  avoided any skin showing signs of inflammation from radiatio    Passive ROM  In Supine: In to flexion, abduction, and ER to Rt shoulder                     [facility] Goals - 04/01/17 1027      CC Long Term Goal  #1   Title  Pt to be able to independently verbalize lymphedema risk reduction practices    Baseline  03/18/17- educated pt on this today, 04/01/17- pt able to independently verbalize these    Time  4    Period  Weeks    Status  Achieved      CC Long Term Goal  #2   Title  Pt to be independent in a home exercise program for continued strengthening and stretching    Time  4    Period  Weeks    Status   On-going      CC Long Term Goal  #3   Title  Pt to demonstrate 160 degrees of right shoulder abduction to allow her to reach items out to sides    Baseline  150, 03/18/17- 160 degrees with tightness    Period  Weeks    Status  Achieved      CC Long Term Goal  #4   Title  Pt to report a 75% decrease in pain in right UE with RUE movements to allow pt to return to prior level of function    Baseline  03/18/17- 20% decrease, 04/01/17- 70% improvement    Time  4    Period  Weeks    Status  On-going      CC Long Term Goal  #5   Title  Pt to report a 75% improvement in edema in right lateral trunk to allow improved comfort    Baseline  03/18/17- 25% improvement, 04/01/17- 65%    Time  4    Period  Weeks    Status  On-going         Plan - 04/15/17 1202    Clinical Impression Statement  Pt continues to see improvement  Avoided MLD to skin showing signs of inflammation so spent alot of time with pt in left sidelying.  She tolerated that position well and had some relief from fullness in back and lateral chest     Rehab Potential  Good    PT Treatment/Interventions  Patient/family education;Therapeutic exercise;ADLs/Self Care Home Management;Therapeutic activities;Manual lymph drainage;Manual techniques;Compression bandaging;Scar mobilization;Passive range of motion;Taping;Orthotic Fit/Training    PT Next Visit Plan  Cont AA/A/PROM to Rt shoulder, MLD R trunk, myofascial to cording in inferior right breast;        Patient will benefit from skilled therapeutic intervention in order to improve the following deficits and impairments:  Pain, Increased fascial restricitons, Decreased scar mobility, Postural dysfunction, Impaired UE functional use, Decreased strength, Decreased range of motion, Increased edema, Decreased knowledge of precautions  Visit Diagnosis: Acute pain of right shoulder  Stiffness of right shoulder, not elsewhere classified  Localized edema  Muscle weakness  (generalized)  Abnormal  posture  Other muscle spasm     Problem List Patient Active Problem List   Diagnosis Date Noted  . Genetic testing 12/24/2016  . Persistent atrial fibrillation ([place]) 12/16/2016  . Long term current use of anticoagulant therapy 12/16/2016  . 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 Family history of breast cancer   . Melanoma ([place])   . Malignant neoplasm of upper-inner quadrant of right breast in female, estrogen receptor positive ([place]) 11/19/2016  . OA (osteoarthritis) of knee 01/29/2013   Donato Heinz. Owens Shark PT  Norwood Levo 04/15/2017, 12:29 PM  [place], [place], [address] Phone: [phone]   Fax:  [phone]  Name: [name] MRN: [id number] Date of Birth: [birth year]

## 2017-04-18 ENCOUNTER — Ambulatory Visit
Admission: RE | Admit: 2017-04-18 | Discharge: 2017-04-18 | Disposition: A | Discharge status: Home / Self Care | Payer: Medicare Other | Source: Ambulatory Visit | Attending: Radiation Oncology | Admitting: Radiation Oncology

## 2017-04-18 DIAGNOSIS — C50211 Malignant neoplasm of upper-inner quadrant of right female breast: Secondary | ICD-10-CM | POA: Diagnosis not present

## 2017-04-19 ENCOUNTER — Ambulatory Visit
Admission: RE | Admit: 2017-04-19 | Discharge: 2017-04-19 | Disposition: A | Discharge status: Home / Self Care | Payer: Medicare Other | Source: Ambulatory Visit | Attending: Radiation Oncology | Admitting: Radiation Oncology

## 2017-04-19 ENCOUNTER — Ambulatory Visit: Payer: Medicare Other

## 2017-04-19 DIAGNOSIS — C50211 Malignant neoplasm of upper-inner quadrant of right female breast: Secondary | ICD-10-CM | POA: Diagnosis not present

## 2017-04-19 DIAGNOSIS — M25511 Pain in right shoulder: Secondary | ICD-10-CM

## 2017-04-19 DIAGNOSIS — Z170 Estrogen receptor positive status [ER+]: Principal | ICD-10-CM

## 2017-04-19 DIAGNOSIS — R600 Localized edema: Secondary | ICD-10-CM

## 2017-04-19 DIAGNOSIS — M25611 Stiffness of right shoulder, not elsewhere classified: Secondary | ICD-10-CM

## 2017-04-19 MED ORDER — SONAFINE EX EMUL
1.0000 | Freq: Once | CUTANEOUS | Status: AC
Start: 2017-04-19 — End: 2017-04-19
  Administered 2017-04-19: 1 via TOPICAL

## 2017-04-19 MED ORDER — RADIAPLEXRX EX GEL
Freq: Once | CUTANEOUS | Status: AC
  Administered 2017-04-19: 10:00:00 via TOPICAL

## 2017-04-19 NOTE — Progress Notes (Signed)
[facility]  Telephone:([phone]) [phone] Fax:(336) [phone]     ID: [name] DOB: 04-25-1941  MR#: [id number]  MBT#:[id number]  Patient Care Team: [name], MD as PCP - General (Family Medicine) Excell Seltzer, MD as Consulting Physician (General Surgery) Magrinat, Virgie Dad, MD as Consulting Physician (Oncology) Gery Pray, MD as Consulting Physician (Radiation Oncology) Lorelle Gibbs, MD (Radiology) Gaynelle Arabian, MD as Consulting Physician (Orthopedic Surgery) Harold Hedge, Darrick Grinder, MD as Consulting Physician (Allergy and Immunology) Teena Irani, MD as Consulting Physician (Gastroenterology) OTHER MD:  CHIEF COMPLAINT: Estrogen receptor positive breast cancer  CURRENT TREATMENT:  Adjuvant radiation ongoing; letrozole   BREAST CANCER HISTORY: From the original intake note:  The patient has had concerns in the right breast dating back to about 3 years when her primary care physician Dr. [name] noted a change. The breast has been followed very closely but no diagnosis was established. She was followed with annual diagnostic mammography and on 11/11/2016 bilateral diagnostic mammography with tomography at [facility] found the breast density to be category C. There was a new 3.5 cm irregular spiculated mass in the right breast upper inner quadrant. Ultrasound was obtained the same day and confirmed a 3.5 cm solid mass in the right breast upper inner quadrant, with no abnormalities noted in the right axilla.  Biopsy of the right breast mass in question 11/15/2013 showed ([id number] invasive lobular carcinoma, E-cadherin negative, grade 1 or 2, estrogen receptor 90% positive, progesterone receptor 2% positive, both with strong staining intensity, with an MIB-1 of 5, and no HER-2 amplification, the signals ratio being 1.14 and the number per cell 1.20.  Her subsequent history is as detailed below  INTERVAL HISTORY: [name] returns today for follow-up  and treatment of her estrogen receptor positive breast cancer, accompanied by her husband, [name]. She continues on letrozole, with good tolerance.  Hot flashes are not a problem.  She takes care of the vaginal dryness issue.  REVIEW OF SYSTEMS: [name] reports that she is well overall. [facility] [employer]. She reports that she generally tries to have a good diet by eating more vegetables. She reports that she was supposed to have her left knee replaced in August. She denies unusual headaches, visual changes, nausea, vomiting, or dizziness. There has been no unusual cough, phlegm production, or pleurisy. This been no change in bowel or bladder habits. She denies unexplained fatigue or unexplained weight loss, bleeding, rash, or fever. A detailed review of systems was otherwise stable.   PAST MEDICAL HISTORY: Past Medical History:  Diagnosis Date  . Anemia    history of  . Anxiety   . Arthritis   . Asthma   . Breast cancer ([place])   . Cancer ([place])    skin cancers, one melanoma   . Complication of anesthesia    patient woke up during colonoscopy  . Concussion    8 yrs. ago due to being thrown from a horse  . Dislocated elbow 10/12/2016   left  . Dysrhythmia 12/2016   A-fib  . Family history of breast cancer   . Hypertension   . 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 Kidney stones    hx of   . Melanoma ([place])   . Persistent atrial fibrillation ([place]) 12/16/2016   CHA2DS2VASC score 3  . Pneumonia    hx of     PAST SURGICAL HISTORY: Past Surgical History:  Procedure Laterality Date  .  brachial skin removal due to deforimity Bilateral   . BUNIONECTOMY     left   . CATARACT EXTRACTION     bilaterla cataract surgery   . COLONOSCOPY W/ POLYPECTOMY    . KNEE ARTHROSCOPY     left   . MELANOMA EXCISION Left   . polyp removed from uterus     . RIGHT BREAST RECONSTRUCTION WITH PLACEMENT OF TISSUE EXPANDER AND ALLODERM Right 01/28/2017    Performed by [name], MD at [facility] Right 01/29/2013   Performed by Gearlean Alf, MD at [facility] ORS  . RIGHT TOTAL MASTECTOMY WITH SENTINEL LYMPH NODE BIOPSY Right 01/28/2017   Performed by Excell Seltzer, MD at [facility]  . right wrist pinning     . skin cancers removed      FAMILY HISTORY Family History  Problem Relation Age of Onset  . COPD Father   . Breast cancer Other        dx in her 23s-30s  The patient's father died at the age of 43 from complications of emphysema and alcohol abuse. The patient's mother died from heart disease at age 34. The patient was an only child. The only relative with breast cancer was a maternal great aunt was diagnosed in her 30s. There is no history of ovarian cancer in the family.  GYNECOLOGIC HISTORY:  No LMP recorded. Patient is postmenopausal. Menarche age 32, first live birth age 78, she is [name] P1. She stopped having periods in 1994. She was using intravaginal Premarin until her breast cancer diagnosis June 2018. She used oral contraceptives for approximately one year remotely with no complications.  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 Her son from her first marriage, [name], lives in [place] and is an Sales promotion account executive. He has 2 children. The patient's second husband, [name], is a retired Film/video editor. He has a son in [place], who works as a Marine scientist. [name] has 5 grandchildren of his own. The patient is a Psychologist, forensic.    ADVANCED DIRECTIVES: In place. The patient tells me she has named her husband [name] and her son [name] as healthcare part of attorney   HEALTH MAINTENANCE: Social History   Tobacco Use  . Smoking status: Never Smoker  . Smokeless tobacco: Never Used  Substance Use Topics  . Alcohol use: Yes    Comment: occ  . Drug use: No     Colonoscopy:2015  PAP:  Bone density: 01/27/2015 at [facility] T score -0.3   Allergies  Allergen Reactions  .  Chlorhexidine Gluconate Hives, Swelling and Rash    SWELLING REACTION UNSPECIFIED    "ChloraPrep "  . Codeine Nausea Only    Current Outpatient Medications  Medication Sig Dispense Refill  . albuterol (PROVENTIL HFA;VENTOLIN HFA) 108 (90 Base) MCG/ACT inhaler Inhale 2 puffs into the lungs every 6 (six) hours as needed. For wheezing/shortness of breath    . ALPRAZolam (XANAX) 0.5 MG tablet Take 0.5-1 mg by mouth daily as needed for anxiety.    . Ascorbic Acid (VITAMIN C PO) Take 1,000 mg by mouth 2 (two) times daily.    . B Complex-C (B-COMPLEX WITH VITAMIN C) tablet Take 1 tablet by mouth daily.    Marland Kitchen BREO ELLIPTA 100-25 MCG/INH AEPB Inhale 1 puff into the lungs daily.    . cholecalciferol (VITAMIN D) 1000 units tablet Take 1,000 Units by mouth daily.    Marland Kitchen desonide (DESOWEN) 0.05 % cream Apply  1 application topically 2 (two) times daily as needed (for skin irritation.).    Marland Kitchen FOLIC ACID PO Take 1 tablet by mouth daily.    Marland Kitchen letrozole (FEMARA) 2.5 MG tablet Take 1 tablet (2.5 mg total) by mouth daily. 30 tablet 3  . levocetirizine (XYZAL) 5 MG tablet Take 5 mg by mouth at bedtime.     . methocarbamol (ROBAXIN) 500 MG tablet Take 1 tablet (500 mg total) by mouth every 8 (eight) hours as needed for muscle spasms. 30 tablet 0  . Methylsulfonylmethane (MSM PO) Take by mouth.    Marland Kitchen MILK THISTLE PO Take 1 capsule by mouth 3 (three) times daily.    . NON FORMULARY Take 1 capsule by mouth daily. Natto-K    . omega-3 acid ethyl esters (LOVAZA) 1 g capsule Take 1 g by mouth daily.    Marland Kitchen oxyCODONE (OXY IR/ROXICODONE) 5 MG immediate release tablet Take 1 tablet (5 mg total) by mouth every 4 (four) hours as needed for severe pain. (Patient not taking: Reported on 04/13/2017) 30 tablet 0  . Potassium 99 MG TABS Take 99 mg by mouth daily.     . Probiotic Product (PROBIOTIC PO) Take 1 capsule by mouth daily as needed (stomach).    . propranolol ER (INDERAL LA) 80 MG 24 hr capsule Take 80 mg by mouth daily.    .  ranitidine (ZANTAC) 150 MG capsule Take 1 capsule (150 mg total) by mouth daily. 30 capsule 0  . Red Yeast Rice Extract POWD Take 1 tablet by mouth daily.    . RESTASIS 0.05 % ophthalmic emulsion Place 1 drop into both eyes daily.     . rivaroxaban (XARELTO) 20 MG TABS tablet Take 1 tablet (20 mg total) by mouth daily with supper. May resume this starting February 02, 2017  0  . tetrahydrozoline-zinc (VISINE-AC) 0.05-0.25 % ophthalmic solution Place 2 drops into both eyes 3 (three) times daily as needed (for allergy eyes/dry eye).    Marland Kitchen triamterene-hydrochlorothiazide (MAXZIDE-25) 37.5-25 MG per tablet Take 1 tablet by mouth every morning.     . TURMERIC PO Take 1 capsule by mouth daily.    Marland Kitchen UBIQUINOL PO Take 1 tablet by mouth daily.    Marland Kitchen venlafaxine XR (EFFEXOR XR) 75 MG 24 hr capsule Take 1 capsule (75 mg total) by mouth daily with breakfast. 90 capsule 2   No current facility-administered medications for this visit.     OBJECTIVE: Middle-aged white woman in no acute distress  Vitals:   04/25/17 1323  BP: (!) 143/94  Pulse: 77  Resp: 20  Temp: (!) 97.4 F (36.3 C)  SpO2: 100%     Body mass index is 26.51 kg/m.   Filed Weights   04/25/17 1323  Weight: 138 lb (62.6 kg)       ECOG FS:1 - Symptomatic but completely ambulatory  Sclerae unicteric, pupils round and equal Oropharynx clear and moist No cervical or supraclavicular adenopathy Lungs no rales or rhonchi Heart regular rate and rhythm Abd soft, nontender, positive bowel sounds MSK no focal spinal tenderness, no upper extremity lymphedema Neuro: nonfocal, well oriented, appropriate affect Breasts: The right breast is status post mastectomy and reconstruction, currently with an expander in place.  There is some erythema but no desquamation over the radiation port area.  Left breast is benign.  Both axillae are benign.  LAB RESULTS:  CMP     Component Value Date/Time   NA 134 (L) 04/25/2017 1216   K 4.3  04/25/2017 1216    CL 89 (L) 01/29/2017 2145   CO2 29 04/25/2017 1216   GLUCOSE 85 04/25/2017 1216   BUN 14.4 04/25/2017 1216   CREATININE 0.7 04/25/2017 1216   CALCIUM 10.0 04/25/2017 1216   PROT 7.2 04/25/2017 1216   ALBUMIN 3.7 04/25/2017 1216   AST 21 04/25/2017 1216   ALT 10 04/25/2017 1216   ALKPHOS 75 04/25/2017 1216   BILITOT 0.65 04/25/2017 1216   GFRNONAA 55 (L) 01/29/2017 2145   GFRAA >60 01/29/2017 2145    No results found for: TOTALPROTELP, ALBUMINELP, A1GS, A2GS, BETS, BETA2SER, GAMS, MSPIKE, SPEI  No results found for: Nils Pyle, [facility]  Lab Results  Component Value Date   WBC 6.6 04/25/2017   NEUTROABS 4.3 04/25/2017   HGB 13.5 04/25/2017   HCT 41.0 04/25/2017   MCV 97.9 04/25/2017   PLT 199 04/25/2017      Chemistry      Component Value Date/Time   NA 134 (L) 04/25/2017 1216   K 4.3 04/25/2017 1216   CL 89 (L) 01/29/2017 2145   CO2 29 04/25/2017 1216   BUN 14.4 04/25/2017 1216   CREATININE 0.7 04/25/2017 1216      Component Value Date/Time   CALCIUM 10.0 04/25/2017 1216   ALKPHOS 75 04/25/2017 1216   AST 21 04/25/2017 1216   ALT 10 04/25/2017 1216   BILITOT 0.65 04/25/2017 1216       No results found for: LABCA2  No components found for: [id number]  No results for input(s): INR in the last 168 hours.  Urinalysis    Component Value Date/Time   COLORURINE YELLOW 01/22/2013 1345   APPEARANCEUR CLEAR 01/22/2013 1345   LABSPEC 1.011 01/22/2013 1345   PHURINE 6.5 01/22/2013 1345   GLUCOSEU NEGATIVE 01/22/2013 1345   HGBUR TRACE (A) 01/22/2013 1345   BILIRUBINUR NEGATIVE 01/22/2013 1345   KETONESUR NEGATIVE 01/22/2013 1345   PROTEINUR NEGATIVE 01/22/2013 1345   UROBILINOGEN 0.2 01/22/2013 1345   NITRITE NEGATIVE 01/22/2013 1345   LEUKOCYTESUR NEGATIVE 01/22/2013 1345     STUDIES: No results found.   ELIGIBLE FOR AVAILABLE RESEARCH PROTOCOL: no  ASSESSMENT: 76 y.o. [place] woman status post right breast upper outer  quadrant biopsy 11/15/2016 for a clinical T2 N0, stage IB invasive lobular carcinoma, grade 1 or 2, estrogen and progesterone receptor positive, HER-2 nonamplified, with an MIB-1 of 5%.  (1) genetics testing pending  (2) anastrozole started 11/24/2016  (3) Status post right mastectomy with sentinel lymph node sampling 01/28/2017 for apT3 pN0, invasive lobular carcinoma, grade 1, with negative margins.   (4) Oncotype DX score of 21 predicts a 10 year risk of recurrence outside the breast of 14% if the patient's only systemic therapy is tamoxifen for 5 years. It also predicts no significant benefit from chemotherapy.  (5) adjuvant radiation to be completed 05/04/2017  (6) continue letrozole, 10 year treatment plan  (a) bone density August 2016 was normal    PLAN: Lillyana will complete her radiation treatments next week.  She has generally done very well and it is wonderful that she has been able to continue to exercise regular  She continues on letrozole with excellent tolerance.  The plan will be to continue that for a total of 5 years.  The one concern with the drug of course is bone density issues.  She had a normal bone density August 2016.  We will repeat that next month.  She is already on vitamin D supplementation and is doing weight bearing  exercise regularly  She will need to take care of her left knee so she can optimize her exercise program.  That will have to wait until next year  She knows to call for any other issues that may develop before the next visit.   Chauncey Cruel, MD   11/24/2016 8:25 PM Medical Oncology and Hematology [facility] ([facility]) [address] [place], [place] [address] Tel. [phone] Fax. [phone]  This document serves as a record of services personally performed by Lurline Del, MD. It was created on his behalf by Sheron Nightingale, a trained medical scribe. The creation of this record is based on the scribe's personal  observations and the provider's statements to them.   I have reviewed the above documentation for accuracy and completeness, and I agree with the above.

## 2017-04-19 NOTE — Therapy (Signed)
[name], [name], [address] Phone: [phone]   Fax:  [phone]  Physical Therapy Treatment  Patient Details  Name: [name] MRN: [id number] Date of Birth: Oct 08, 1940 Referring Provider: Thimmappa   Encounter Date: 04/19/2017  PT End of Session - 04/19/17 1155    Visit Number  13    Number of Visits  17    Date for PT Re-Evaluation  04/29/17    PT Start Time  1110    PT Stop Time  1151    PT Time Calculation (min)  41 min    Activity Tolerance  Patient tolerated treatment well    Behavior During Therapy  WFL for tasks assessed/performed       Past Medical History:  Diagnosis Date  . Anemia    history of  . Anxiety   . Arthritis   . Asthma   . Breast cancer ([place])   . Cancer ([place])    skin cancers, one melanoma   . Complication of anesthesia    patient woke up during colonoscopy  . Concussion    8 yrs. ago due to being thrown from a horse  . Dislocated elbow 10/12/2016   left  . Dysrhythmia 12/2016   A-fib  . Family history of breast cancer   . Hypertension   . 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 Kidney stones    hx of   . Melanoma ([place])   . Persistent atrial fibrillation ([place]) 12/16/2016   CHA2DS2VASC score 3  . Pneumonia    hx of     Past Surgical History:  Procedure Laterality Date  . brachial skin removal due to deforimity Bilateral   . BUNIONECTOMY     left   . CATARACT EXTRACTION     bilaterla cataract surgery   . COLONOSCOPY W/ POLYPECTOMY    . KNEE ARTHROSCOPY     left   . MELANOMA EXCISION Left   . polyp removed from uterus     . right wrist pinning     . skin cancers removed      There were no vitals filed for this visit.  Subjective Assessment - 04/19/17 1115    Subjective  They took the radiation tape off because I was getting blisters under them but now my bra hits it so it's just still really irritated. I felt good after last session  though, always achy the day after but good from then on.  I'm not actually hurting today, my chest is just blistered and irritated. I started taking collagen recently for my skin, haven't tole the doctor yet but plan too.     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 It measures 3.5 cm and is located in the upper inner quadrant. It is ER/PR positive and HER2 negative. She had a traumatic fall 10/12/16 which resulted in a left elbow dislocation, She had a right total knee replacement in 2014 and has a left total knee replacement scheduled for 01/10/17., new onset of A-fib, underwent R mastectomy on 01/28/17, and SLNB all clear, pt has to complete radiation    Patient Stated Goals  to be able to move my arm without any pain    Currently in Pain?  No/denies                      [facility] Adult PT Treatment/Exercise - 04/19/17 0001  Manual Therapy   Manual Therapy  Myofascial release;Manual Lymphatic Drainage (MLD);Passive ROM    Myofascial Release  to inferior right breast cording with arm placed overhead on pillow briefly avoiding radiated skin    Manual Lymphatic Drainage (MLD)  in supine, superficial and deep abdominals, right inguinal nodes and right axillo-inguinal anastamosis, Lt axillary nodes  then to left sidelying, with Rt arm on pillow to decrease discomfort of blisters, for posteior interaxillary anastamosis, left back and lateral chest  avoided any skin showing signs of radiation inflammation    Passive ROM  In Supine: In to flexion, abduction, and ER to Rt shoulder                     [facility] Goals - 04/01/17 1027      CC Long Term Goal  #1   Title  Pt to be able to independently verbalize lymphedema risk reduction practices    Baseline  03/18/17- educated pt on this today, 04/01/17- pt able to independently verbalize these    Time  4    Period  Weeks    Status  Achieved      CC  Long Term Goal  #2   Title  Pt to be independent in a home exercise program for continued strengthening and stretching    Time  4    Period  Weeks    Status  On-going      CC Long Term Goal  #3   Title  Pt to demonstrate 160 degrees of right shoulder abduction to allow her to reach items out to sides    Baseline  150, 03/18/17- 160 degrees with tightness    Period  Weeks    Status  Achieved      CC Long Term Goal  #4   Title  Pt to report a 75% decrease in pain in right UE with RUE movements to allow pt to return to prior level of function    Baseline  03/18/17- 20% decrease, 04/01/17- 70% improvement    Time  4    Period  Weeks    Status  On-going      CC Long Term Goal  #5   Title  Pt to report a 75% improvement in edema in right lateral trunk to allow improved comfort    Baseline  03/18/17- 25% improvement, 04/01/17- 65%    Time  4    Period  Weeks    Status  On-going         Plan - 04/19/17 1157    Clinical Impression Statement  Continued with focus in Lt S/L after briefly stretching Lt shoulder in supine. Pt reports therapy has very beneficial during this time in helping her with her end ROM and decreasing the swelling as she this causes her discomfort the worse it gets.      Rehab Potential  Good    Clinical Impairments Affecting Rehab Potential  pt to begin radiation 03/22/17    PT Frequency  2x / week    PT Duration  4 weeks    PT Treatment/Interventions  Patient/family education;Therapeutic exercise;ADLs/Self Care Home Management;Therapeutic activities;Manual lymph drainage;Manual techniques;Compression bandaging;Scar mobilization;Passive range of motion;Taping;Orthotic Fit/Training    PT Next Visit Plan  Assess goals. Cont AA/A/PROM to Rt shoulder, MLD Rt trunk avoiding radiated skin/tissue, myofascial to cording in inferior right breast;     Consulted and Agree with Plan of Care  Patient       Patient  will benefit from skilled therapeutic intervention in order to  improve the following deficits and impairments:  Pain, Increased fascial restricitons, Decreased scar mobility, Postural dysfunction, Impaired UE functional use, Decreased strength, Decreased range of motion, Increased edema, Decreased knowledge of precautions  Visit Diagnosis: Acute pain of right shoulder  Stiffness of right shoulder, not elsewhere classified  Localized edema     Problem List Patient Active Problem List   Diagnosis Date Noted  . Genetic testing 12/24/2016  . Persistent atrial fibrillation ([place]) 12/16/2016  . Long term current use of anticoagulant therapy 12/16/2016  . 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 Family history of breast cancer   . Melanoma ([place])   . Malignant neoplasm of upper-inner quadrant of right breast in female, estrogen receptor positive ([place]) 11/19/2016  . OA (osteoarthritis) of knee 01/29/2013    [name], PTA 04/19/2017, 12:04 PM  [name], [name], [name] Phone: [phone]   Fax:  [phone]  Name: [name] MRN: [id number] Date of Birth: Jan 08, 1941

## 2017-04-20 ENCOUNTER — Ambulatory Visit: Payer: Medicare Other

## 2017-04-21 ENCOUNTER — Ambulatory Visit: Payer: Medicare Other

## 2017-04-21 ENCOUNTER — Ambulatory Visit
Admission: RE | Admit: 2017-04-21 | Discharge: 2017-04-21 | Disposition: A | Discharge status: Home / Self Care | Payer: Medicare Other | Source: Ambulatory Visit | Attending: Radiation Oncology | Admitting: Radiation Oncology

## 2017-04-21 DIAGNOSIS — M25511 Pain in right shoulder: Secondary | ICD-10-CM | POA: Diagnosis not present

## 2017-04-21 DIAGNOSIS — R600 Localized edema: Secondary | ICD-10-CM

## 2017-04-21 DIAGNOSIS — C50211 Malignant neoplasm of upper-inner quadrant of right female breast: Secondary | ICD-10-CM | POA: Diagnosis not present

## 2017-04-21 DIAGNOSIS — M25611 Stiffness of right shoulder, not elsewhere classified: Secondary | ICD-10-CM

## 2017-04-21 NOTE — Therapy (Signed)
[place], [place], [address] Phone: [phone]   Fax:  [phone]  Physical Therapy Treatment  Patient Details  Name: [name] MRN: [id number] Date of Birth: 12-12-1940 Referring Provider: Thimmappa   Encounter Date: 04/21/2017  PT End of Session - 04/21/17 1111    Visit Number  14    Number of Visits  17    Date for PT Re-Evaluation  04/29/17    PT Start Time  1100    PT Stop Time  1151    PT Time Calculation (min)  51 min    Activity Tolerance  Patient tolerated treatment well    Behavior During Therapy  WFL for tasks assessed/performed       Past Medical History:  Diagnosis Date  . Anemia    history of  . Anxiety   . Arthritis   . Asthma   . Breast cancer ([place])   . Cancer ([place])    skin cancers, one melanoma   . Complication of anesthesia    patient woke up during colonoscopy  . Concussion    8 yrs. ago due to being thrown from a horse  . Dislocated elbow 10/12/2016   left  . Dysrhythmia 12/2016   A-fib  . Family history of breast cancer   . Hypertension   . 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 Kidney stones    hx of   . Melanoma ([place])   . Persistent atrial fibrillation ([place]) 12/16/2016   CHA2DS2VASC score 3  . Pneumonia    hx of     Past Surgical History:  Procedure Laterality Date  . brachial skin removal due to deforimity Bilateral   . BREAST RECONSTRUCTION WITH PLACEMENT OF TISSUE EXPANDER AND FLEX HD (ACELLULAR HYDRATED DERMIS) Right 01/28/2017   Procedure: RIGHT BREAST RECONSTRUCTION WITH PLACEMENT OF TISSUE EXPANDER AND ALLODERM;  Surgeon: [name], MD;  Location: [facility];  Service: Plastics;  Laterality: Right;  . BUNIONECTOMY     left   . CATARACT EXTRACTION     bilaterla cataract surgery   . COLONOSCOPY W/ POLYPECTOMY    . KNEE ARTHROSCOPY     left   . MASTECTOMY W/ SENTINEL NODE BIOPSY Right 01/28/2017   Procedure: RIGHT TOTAL MASTECTOMY  WITH SENTINEL LYMPH NODE BIOPSY;  Surgeon: Excell Seltzer, MD;  Location: [facility];  Service: General;  Laterality: Right;  . MELANOMA EXCISION Left   . polyp removed from uterus     . right wrist pinning     . skin cancers removed    . TOTAL KNEE ARTHROPLASTY Right 01/29/2013   Procedure: RIGHT TOTAL KNEE ARTHROPLASTY;  Surgeon: Gearlean Alf, MD;  Location: [facility];  Service: Orthopedics;  Laterality: Right;    There were no vitals filed for this visit.  Subjective Assessment - 04/21/17 1101    Subjective  They had to cancel radiation yesterday bc the power went out. And today one of the machines were down so I barely made it here on time. I went without a bra for a bit yesterday like you suggested and it felt great on my blisters! Therapy has been so beneficial during radiation with helping keep the swelling under control and decreasing the tightness from my inferior Rt breast cording. They also have me using a newer cream called Sonafine and it's been helping alot.    Pertinent History  She was diagnosed on 11/11/16 with right invasive lobular carcinoma with LCIS breast  cancer. It measures 3.5 cm and is located in the upper inner quadrant. It is ER/PR positive and HER2 negative. She had a traumatic fall 10/12/16 which resulted in a left elbow dislocation, She had a right total knee replacement in 2014 and has a left total knee replacement scheduled for 01/10/17., new onset of A-fib, underwent R mastectomy on 01/28/17, and SLNB all clear, pt has to complete radiation    Patient Stated Goals  to be able to move my arm without any pain    Currently in Pain?  No/denies                      [facility] Adult PT Treatment/Exercise - 04/21/17 0001      Shoulder Exercises: Pulleys   Flexion  2 minutes    Flexion Limitations  Pt reports feeing good stertch bilaterally today    ABduction  2 minutes      Shoulder Exercises: ROM/Strengthening   Other ROM/Strengthening Exercises  Finger Ladder:  Bil UE abduction 5 times each with VCs to decrease shoulder hiking      Manual Therapy   Manual Therapy  Myofascial release;Manual Lymphatic Drainage (MLD);Passive ROM    Myofascial Release  to inferior right breast cording with arm placed overhead on pillow being mindful avoiding radiated skin, also briefly with Lt low trunk rotation for increased myofascial pull    Manual Lymphatic Drainage (MLD)  in supine, superficial and deep abdominals, right inguinal nodes and right axillo-inguinal anastamosis, Lt axillary nodes, also focused on area inferior to breast after myofascial release to inferior breast at area of cording.  avoided any skin showing signs of radiation inflammation    Passive ROM  In Supine: In to flexion and abduction  briefly to Rt shoulder as pt demonstrated almost full end P/ROM.                     [facility] Goals - 04/21/17 1103      CC Long Term Goal  #1   Title  Pt to be able to independently verbalize lymphedema risk reduction practices    Baseline  03/18/17- educated pt on this today, 04/01/17- pt able to independently verbalize these    Status  Achieved      CC Long Term Goal  #2   Title  Pt to be independent in a home exercise program for continued strengthening and stretching    Status  On-going      CC Long Term Goal  #3   Title  Pt to demonstrate 160 degrees of right shoulder abduction to allow her to reach items out to sides    Baseline  150, 03/18/17- 160 degrees with tightness; right 173 and left 172 degrees-04/21/17    Status  Achieved      CC Long Term Goal  #4   Title  Pt to report a 75% decrease in pain in right UE with RUE movements to allow pt to return to prior level of function    Baseline  03/18/17- 20% decrease, 04/01/17- 70% improvement; 80% improvement at this time(biggest c/o is end ROM reaching can feel inferior breast cording)-04/21/17    Status  Achieved      CC Long Term Goal  #5   Title  Pt to report a 75%  improvement in edema in right lateral trunk to allow improved comfort    Baseline  03/18/17- 25% improvement, 04/01/17- 65%; 75% improvement at this time, radiation is  slwoing this down recently though-04/21/17    Status  Achieved         Plan - 04/21/17 1112    Clinical Impression Statement  Pt has made excellent gains with therapy overall and has met most goals. She reports therapy has thus far been very beneficial in helping her regain her ROM and decrease tightness, but more recently has made radiation more bearable as well with keeping her swelling manageable and the tightness from the cording less impairing with her ADLs (pt reports radiation has been recurring these symptoms).     Rehab Potential  Good    Clinical Impairments Affecting Rehab Potential  pt to begin radiation 03/22/17    PT Frequency  2x / week    PT Duration  4 weeks    PT Treatment/Interventions  Patient/family education;Therapeutic exercise;ADLs/Self Care Home Management;Therapeutic activities;Manual lymph drainage;Manual techniques;Compression bandaging;Scar mobilization;Passive range of motion;Taping;Orthotic Fit/Training    PT Next Visit Plan  Cont AA/A/PROM to Rt shoulder, MLD Rt trunk avoiding radiated skin/tissue, myofascial to cording in inferior right breast;     Consulted and Agree with Plan of Care  Patient       Patient will benefit from skilled therapeutic intervention in order to improve the following deficits and impairments:  Pain, Increased fascial restricitons, Decreased scar mobility, Postural dysfunction, Impaired UE functional use, Decreased strength, Decreased range of motion, Increased edema, Decreased knowledge of precautions  Visit Diagnosis: Acute pain of right shoulder  Stiffness of right shoulder, not elsewhere classified  Localized edema     Problem List Patient Active Problem List   Diagnosis Date Noted  . Genetic testing 12/24/2016  . Persistent atrial fibrillation ([place])  12/16/2016  . Long term current use of anticoagulant therapy 12/16/2016  . 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 Family history of breast cancer   . Melanoma ([place])   . Malignant neoplasm of upper-inner quadrant of right breast in female, estrogen receptor positive ([place]) 11/19/2016  . OA (osteoarthritis) of knee 01/29/2013    Otelia Limes, PTA 04/21/2017, 11:55 AM  [place], [place], [address] Phone: [phone]   Fax:  [phone]  Name: [name] MRN: [id number] Date of Birth: 01-15-1941

## 2017-04-22 ENCOUNTER — Other Ambulatory Visit: Payer: Self-pay

## 2017-04-22 ENCOUNTER — Ambulatory Visit
Admission: RE | Admit: 2017-04-22 | Discharge: 2017-04-22 | Disposition: A | Discharge status: Home / Self Care | Payer: Medicare Other | Source: Ambulatory Visit | Attending: Radiation Oncology | Admitting: Radiation Oncology

## 2017-04-22 DIAGNOSIS — C50211 Malignant neoplasm of upper-inner quadrant of right female breast: Secondary | ICD-10-CM | POA: Diagnosis not present

## 2017-04-22 DIAGNOSIS — Z170 Estrogen receptor positive status [ER+]: Principal | ICD-10-CM

## 2017-04-24 ENCOUNTER — Ambulatory Visit
Admission: RE | Admit: 2017-04-24 | Discharge: 2017-04-24 | Disposition: A | Discharge status: Home / Self Care | Payer: Medicare Other | Source: Ambulatory Visit | Attending: Radiation Oncology | Admitting: Radiation Oncology

## 2017-04-24 DIAGNOSIS — C50211 Malignant neoplasm of upper-inner quadrant of right female breast: Secondary | ICD-10-CM | POA: Diagnosis not present

## 2017-04-25 ENCOUNTER — Ambulatory Visit: Payer: Medicare Other | Admitting: Physical Therapy

## 2017-04-25 ENCOUNTER — Ambulatory Visit
Admission: RE | Admit: 2017-04-25 | Discharge: 2017-04-25 | Disposition: A | Discharge status: Home / Self Care | Payer: Medicare Other | Source: Ambulatory Visit | Attending: Radiation Oncology | Admitting: Radiation Oncology

## 2017-04-25 ENCOUNTER — Encounter: Payer: Self-pay | Admitting: Physical Therapy

## 2017-04-25 ENCOUNTER — Other Ambulatory Visit (HOSPITAL_BASED_OUTPATIENT_CLINIC_OR_DEPARTMENT_OTHER): Payer: Medicare Other

## 2017-04-25 ENCOUNTER — Telehealth: Payer: Self-pay | Admitting: Oncology

## 2017-04-25 ENCOUNTER — Ambulatory Visit (HOSPITAL_BASED_OUTPATIENT_CLINIC_OR_DEPARTMENT_OTHER): Payer: Medicare Other | Admitting: Oncology

## 2017-04-25 VITALS — BP 143/94 | HR 77 | Temp 97.4°F | Resp 20 | Ht 60.5 in | Wt 138.0 lb

## 2017-04-25 DIAGNOSIS — M25511 Pain in right shoulder: Secondary | ICD-10-CM

## 2017-04-25 DIAGNOSIS — R600 Localized edema: Secondary | ICD-10-CM

## 2017-04-25 DIAGNOSIS — Z79811 Long term (current) use of aromatase inhibitors: Secondary | ICD-10-CM | POA: Diagnosis not present

## 2017-04-25 DIAGNOSIS — Z170 Estrogen receptor positive status [ER+]: Secondary | ICD-10-CM | POA: Diagnosis not present

## 2017-04-25 DIAGNOSIS — C50211 Malignant neoplasm of upper-inner quadrant of right female breast: Secondary | ICD-10-CM | POA: Diagnosis not present

## 2017-04-25 DIAGNOSIS — Z1379 Encounter for other screening for genetic and chromosomal anomalies: Secondary | ICD-10-CM

## 2017-04-25 DIAGNOSIS — M25611 Stiffness of right shoulder, not elsewhere classified: Secondary | ICD-10-CM

## 2017-04-25 LAB — CBC WITH DIFFERENTIAL/PLATELET
BASO%: 0.6 % (ref 0.0–2.0)
BASOS ABS: 0 10*3/uL (ref 0.0–0.1)
EOS%: 2.7 % (ref 0.0–7.0)
Eosinophils Absolute: 0.2 10*3/uL (ref 0.0–0.5)
HEMATOCRIT: 41 % (ref 34.8–46.6)
HGB: 13.5 g/dL (ref 11.6–15.9)
LYMPH#: 0.9 10*3/uL (ref 0.9–3.3)
LYMPH%: 14 % (ref 14.0–49.7)
MCH: 32.2 pg (ref 25.1–34.0)
MCHC: 32.9 g/dL (ref 31.5–36.0)
MCV: 97.9 fL (ref 79.5–101.0)
MONO#: 1.2 10*3/uL — ABNORMAL HIGH (ref 0.1–0.9)
MONO%: 17.8 % — ABNORMAL HIGH (ref 0.0–14.0)
NEUT#: 4.3 10*3/uL (ref 1.5–6.5)
NEUT%: 64.9 % (ref 38.4–76.8)
Platelets: 199 10*3/uL (ref 145–400)
RBC: 4.19 10*6/uL (ref 3.70–5.45)
RDW: 13.3 % (ref 11.2–14.5)
WBC: 6.6 10*3/uL (ref 3.9–10.3)

## 2017-04-25 LAB — COMPREHENSIVE METABOLIC PANEL
ALT: 10 U/L (ref 0–55)
AST: 21 U/L (ref 5–34)
Albumin: 3.7 g/dL (ref 3.5–5.0)
Alkaline Phosphatase: 75 U/L (ref 40–150)
Anion Gap: 9 mEq/L (ref 3–11)
BILIRUBIN TOTAL: 0.65 mg/dL (ref 0.20–1.20)
BUN: 14.4 mg/dL (ref 7.0–26.0)
CO2: 29 meq/L (ref 22–29)
CREATININE: 0.7 mg/dL (ref 0.6–1.1)
Calcium: 10 mg/dL (ref 8.4–10.4)
Chloride: 96 mEq/L — ABNORMAL LOW (ref 98–109)
GLUCOSE: 85 mg/dL (ref 70–140)
Potassium: 4.3 mEq/L (ref 3.5–5.1)
SODIUM: 134 meq/L — AB (ref 136–145)
TOTAL PROTEIN: 7.2 g/dL (ref 6.4–8.3)

## 2017-04-25 NOTE — Telephone Encounter (Signed)
Gave patient avs report and appointments for February and appointment for bone density test at [facility] 05/25/17 @ 9 am.

## 2017-04-25 NOTE — Therapy (Signed)
[place], [place], [address] Phone: [phone]   Fax:  [phone]  Physical Therapy Treatment  Patient Details  Name: [name] MRN: [id number] Date of Birth: July 15, 1940 Referring Provider: Thimmappa   Encounter Date: 04/25/2017  PT End of Session - 04/25/17 1604    Visit Number  15    Number of Visits  17    Date for PT Re-Evaluation  04/29/17    PT Start Time  1520    PT Stop Time  1600    PT Time Calculation (min)  40 min    Activity Tolerance  Patient tolerated treatment well    Behavior During Therapy  WFL for tasks assessed/performed       Past Medical History:  Diagnosis Date  . Anemia    history of  . Anxiety   . Arthritis   . Asthma   . Breast cancer ([place])   . Cancer ([place])    skin cancers, one melanoma   . Complication of anesthesia    patient woke up during colonoscopy  . Concussion    8 yrs. ago due to being thrown from a horse  . Dislocated elbow 10/12/2016   left  . Dysrhythmia 12/2016   A-fib  . Family history of breast cancer   . Hypertension   . 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 Kidney stones    hx of   . Melanoma ([place])   . Persistent atrial fibrillation ([place]) 12/16/2016   CHA2DS2VASC score 3  . Pneumonia    hx of     Past Surgical History:  Procedure Laterality Date  . brachial skin removal due to deforimity Bilateral   . BUNIONECTOMY     left   . CATARACT EXTRACTION     bilaterla cataract surgery   . COLONOSCOPY W/ POLYPECTOMY    . KNEE ARTHROSCOPY     left   . MELANOMA EXCISION Left   . polyp removed from uterus     . RIGHT BREAST RECONSTRUCTION WITH PLACEMENT OF TISSUE EXPANDER AND ALLODERM Right 01/28/2017   Performed by [name], MD at [facility] Right 01/29/2013   Performed by Gearlean Alf, MD at [facility]  . RIGHT TOTAL MASTECTOMY WITH SENTINEL LYMPH NODE BIOPSY Right 01/28/2017   Performed  by Excell Seltzer, MD at [facility]  . right wrist pinning     . skin cancers removed      There were no vitals filed for this visit.  Subjective Assessment - 04/25/17 1524    Subjective  I finish with radiation on the 29th. I am having some swelling from the radiation. The cording is doing better.     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 It measures 3.5 cm and is located in the upper inner quadrant. It is ER/PR positive and HER2 negative. She had a traumatic fall 10/12/16 which resulted in a left elbow dislocation, She had a right total knee replacement in 2014 and has a left total knee replacement scheduled for 01/10/17., new onset of A-fib, underwent R mastectomy on 01/28/17, and SLNB all clear, pt has to complete radiation    Patient Stated Goals  to be able to move my arm without any pain    Currently in Pain?  Yes    Pain Score  7     Pain Location  Axilla    Pain Orientation  Right    Pain Descriptors / Indicators  Tightness                      [facility] Adult PT Treatment/Exercise - 04/25/17 0001      Shoulder Exercises: Pulleys   Flexion  2 minutes    ABduction  2 minutes      Shoulder Exercises: ROM/Strengthening   Other ROM/Strengthening Exercises  Finger Ladder: Bil UE abduction 5 times each with VCs to decrease shoulder hiking 23 on R and 24 on L      Manual Therapy   Manual Therapy  Myofascial release;Manual Lymphatic Drainage (MLD);Passive ROM    Myofascial Release  to inferior right breast cording with arm placed overhead on pillow being mindful avoiding radiated skin    Manual Lymphatic Drainage (MLD)  in supine, superficial and deep abdominals, right inguinal nodes and right axillo-inguinal anastamosis, Lt axillary nodes, also focused on area inferior to breast after myofascial release to inferior breast at area of cording.  avoided any skin showing signs of radiation inflammation     Passive ROM  In Supine: In to flexion and abduction  to R shoulder                     [facility] Goals - 04/21/17 1103      CC Long Term Goal  #1   Title  Pt to be able to independently verbalize lymphedema risk reduction practices    Baseline  03/18/17- educated pt on this today, 04/01/17- pt able to independently verbalize these    Status  Achieved      CC Long Term Goal  #2   Title  Pt to be independent in a home exercise program for continued strengthening and stretching    Status  On-going      CC Long Term Goal  #3   Title  Pt to demonstrate 160 degrees of right shoulder abduction to allow her to reach items out to sides    Baseline  150, 03/18/17- 160 degrees with tightness; right 173 and left 172 degrees-04/21/17    Status  Achieved      CC Long Term Goal  #4   Title  Pt to report a 75% decrease in pain in right UE with RUE movements to allow pt to return to prior level of function    Baseline  03/18/17- 20% decrease, 04/01/17- 70% improvement; 80% improvement at this time(biggest c/o is end ROM reaching can feel inferior breast cording)-04/21/17    Status  Achieved      CC Long Term Goal  #5   Title  Pt to report a 75% improvement in edema in right lateral trunk to allow improved comfort    Baseline  03/18/17- 25% improvement, 04/01/17- 65%; 75% improvement at this time, radiation is slwoing this down recently though-04/21/17    Status  Achieved         Plan - 04/25/17 1604    Clinical Impression Statement  Pt continues to demonstrate improved bilateral shouler ROM. She has some scabs and slight bleeding from radiation today. Was very careful in session not to touch inflammed, radiated tissue. Pt's cording also continues to improve and there is only 1 cord that is palpable when arm is flexed.     Rehab Potential  Good    Clinical Impairments Affecting Rehab Potential  pt to begin radiation 03/22/17    PT  Frequency  2x / week    PT Duration  4 weeks     PT Treatment/Interventions  Patient/family education;Therapeutic exercise;ADLs/Self Care Home Management;Therapeutic activities;Manual lymph drainage;Manual techniques;Compression bandaging;Scar mobilization;Passive range of motion;Taping;Orthotic Fit/Training    PT Next Visit Plan  Cont AA/A/PROM to Rt shoulder, MLD Rt trunk avoiding radiated skin/tissue, myofascial to cording in inferior right breast;     Consulted and Agree with Plan of Care  Patient       Patient will benefit from skilled therapeutic intervention in order to improve the following deficits and impairments:  Pain, Increased fascial restricitons, Decreased scar mobility, Postural dysfunction, Impaired UE functional use, Decreased strength, Decreased range of motion, Increased edema, Decreased knowledge of precautions  Visit Diagnosis: Acute pain of right shoulder  Stiffness of right shoulder, not elsewhere classified  Localized edema     Problem List Patient Active Problem List   Diagnosis Date Noted  . Genetic testing 12/24/2016  . Persistent atrial fibrillation ([place]) 12/16/2016  . Long term current use of anticoagulant therapy 12/16/2016  . 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 Family history of breast cancer   . Melanoma ([place])   . Malignant neoplasm of upper-inner quadrant of right breast in female, estrogen receptor positive ([place]) 11/19/2016  . OA (osteoarthritis) of knee 01/29/2013    Allyson Sabal [facility] 04/25/2017, 4:06 PM  [place], [place], [address] Phone: [phone]   Fax:  [phone]  Name: [name] MRN: [id number] Date of Birth: Oct 22, 1940  Manus Gunning, PT 04/25/17 4:06 PM

## 2017-04-26 ENCOUNTER — Ambulatory Visit: Payer: Medicare Other | Admitting: Physical Therapy

## 2017-04-26 ENCOUNTER — Ambulatory Visit
Admission: RE | Admit: 2017-04-26 | Discharge: 2017-04-26 | Disposition: A | Discharge status: Home / Self Care | Payer: Medicare Other | Source: Ambulatory Visit | Attending: Radiation Oncology | Admitting: Radiation Oncology

## 2017-04-26 ENCOUNTER — Encounter: Payer: Self-pay | Admitting: Physical Therapy

## 2017-04-26 DIAGNOSIS — M25511 Pain in right shoulder: Secondary | ICD-10-CM | POA: Diagnosis not present

## 2017-04-26 DIAGNOSIS — R293 Abnormal posture: Secondary | ICD-10-CM

## 2017-04-26 DIAGNOSIS — M25611 Stiffness of right shoulder, not elsewhere classified: Secondary | ICD-10-CM

## 2017-04-26 DIAGNOSIS — C50211 Malignant neoplasm of upper-inner quadrant of right female breast: Secondary | ICD-10-CM | POA: Diagnosis not present

## 2017-04-26 DIAGNOSIS — R600 Localized edema: Secondary | ICD-10-CM

## 2017-04-26 DIAGNOSIS — M6281 Muscle weakness (generalized): Secondary | ICD-10-CM

## 2017-04-26 NOTE — Therapy (Signed)
[place], [place], [address] Phone: [phone]   Fax:  [phone]  Physical Therapy Treatment  Patient Details  Name: [name] MRN: [id number] Date of Birth: [birth year] Referring Provider: Thimmappa   Encounter Date: 04/26/2017  PT End of Session - 04/26/17 1537    Visit Number  16    Number of Visits  17    Date for PT Re-Evaluation  04/29/17    PT Start Time  1351    PT Stop Time  1440    PT Time Calculation (min)  49 min    Activity Tolerance  Patient tolerated treatment well    Behavior During Therapy  WFL for tasks assessed/performed       Past Medical History:  Diagnosis Date  . Anemia    history of  . Anxiety   . Arthritis   . Asthma   . Breast cancer ([place])   . Cancer ([place])    skin cancers, one melanoma   . Complication of anesthesia    patient woke up during colonoscopy  . Concussion    8 yrs. ago due to being thrown from a horse  . Dislocated elbow 10/12/2016   left  . Dysrhythmia 12/2016   A-fib  . Family history of breast cancer   . Hypertension   . 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 Kidney stones    hx of   . Melanoma ([place])   . Persistent atrial fibrillation ([place]) 12/16/2016   CHA2DS2VASC score 3  . Pneumonia    hx of     Past Surgical History:  Procedure Laterality Date  . brachial skin removal due to deforimity Bilateral   . BREAST RECONSTRUCTION WITH PLACEMENT OF TISSUE EXPANDER AND FLEX HD (ACELLULAR HYDRATED DERMIS) Right 01/28/2017   Procedure: RIGHT BREAST RECONSTRUCTION WITH PLACEMENT OF TISSUE EXPANDER AND ALLODERM;  Surgeon: [name], MD;  Location: [facility];  Service: Plastics;  Laterality: Right;  . BUNIONECTOMY     left   . CATARACT EXTRACTION     bilaterla cataract surgery   . COLONOSCOPY W/ POLYPECTOMY    . KNEE ARTHROSCOPY     left   . MASTECTOMY W/ SENTINEL NODE BIOPSY Right 01/28/2017   Procedure: RIGHT TOTAL MASTECTOMY  WITH SENTINEL LYMPH NODE BIOPSY;  Surgeon: Excell Seltzer, MD;  Location: [facility];  Service: General;  Laterality: Right;  . MELANOMA EXCISION Left   . polyp removed from uterus     . right wrist pinning     . skin cancers removed    . TOTAL KNEE ARTHROPLASTY Right 01/29/2013   Procedure: RIGHT TOTAL KNEE ARTHROPLASTY;  Surgeon: Gearlean Alf, MD;  [facility];  Service: Orthopedics;  Laterality: Right;    There were no vitals filed for this visit.  Subjective Assessment - 04/26/17 1356    Subjective  I felt good after last session. The doctor prescribed me a cream to put on the burns from the radiation.     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 It measures 3.5 cm and is located in the upper inner quadrant. It is ER/PR positive and HER2 negative. She had a traumatic fall 10/12/16 which resulted in a left elbow dislocation, She had a right total knee replacement in 2014 and has a left total knee replacement scheduled for 01/10/17., new onset of A-fib, underwent R mastectomy on 01/28/17, and SLNB all  clear, pt has to complete radiation    Patient Stated Goals  to be able to move my arm without any pain    Currently in Pain?  No/denies    Pain Score  0-No pain                      [facility] Adult PT Treatment/Exercise - 04/26/17 0001      Shoulder Exercises: Standing   External Rotation  Strengthening;Both;10 reps;Theraband    Theraband Level (Shoulder External Rotation)  Level 1 (Yellow)    Internal Rotation  Strengthening;Both;10 reps;Theraband    Theraband Level (Shoulder Internal Rotation)  Level 1 (Yellow)    Flexion  Strengthening;Both;10 reps;Theraband    Theraband Level (Shoulder Flexion)  Level 1 (Yellow)    Extension  Strengthening;Both;10 reps;Theraband    Theraband Level (Shoulder Extension)  Level 1 (Yellow)    Row  Strengthening;Both;10 reps;Theraband    Theraband Level (Shoulder Row)   Level 1 (Yellow)    Other Standing Exercises  3 way shoulder with 1 lb weight x 10 reps      Shoulder Exercises: Pulleys   Flexion  2 minutes    ABduction  2 minutes      Shoulder Exercises: ROM/Strengthening   Other ROM/Strengthening Exercises  Finger Ladder: Bil UE abduction 5 times each with VCs to decrease shoulder hiking      Manual Therapy   Manual Lymphatic Drainage (MLD)  in supine, superficial and deep abdominals, right inguinal nodes and right axillo-inguinal anastamosis, Lt axillary nodes, and interaxillary anastomosis, focused on area of swelling in right lateral trunk and axilla moving fluid towards pathways then to left sidelying to focus on posterior inter axillary anastomosis and area of swelling in back, finished in supine reestablishing pathways avoided any skin showing signs of radiation inflammation                     Long Term Clinic Goals - 04/21/17 1103      CC Long Term Goal  #1   Title  Pt to be able to independently verbalize lymphedema risk reduction practices    Baseline  03/18/17- educated pt on this today, 04/01/17- pt able to independently verbalize these    Status  Achieved      CC Long Term Goal  #2   Title  Pt to be independent in a home exercise program for continued strengthening and stretching    Status  On-going      CC Long Term Goal  #3   Title  Pt to demonstrate 160 degrees of right shoulder abduction to allow her to reach items out to sides    Baseline  150, 03/18/17- 160 degrees with tightness; right 173 and left 172 degrees-04/21/17    Status  Achieved      CC Long Term Goal  #4   Title  Pt to report a 75% decrease in pain in right UE with RUE movements to allow pt to return to prior level of function    Baseline  03/18/17- 20% decrease, 04/01/17- 70% improvement; 80% improvement at this time(biggest c/o is end ROM reaching can feel inferior breast cording)-04/21/17    Status  Achieved      CC Long Term Goal  #5   Title   Pt to report a 75% improvement in edema in right lateral trunk to allow improved comfort    Baseline  03/18/17- 25% improvement, 04/01/17- 65%; 75% improvement at this time, radiation  is slwoing this down recently though-04/21/17    Status  Achieved         Plan - 04/26/17 1537    Clinical Impression Statement  Began strengthening exercises today including rockwood and 3 way shoulder raises with 1 lb weights and added these to pt's home exercise program. Pt felt she was having more swelling today especially in her back so focused on MLD to reduce edema and improve comfort.     Rehab Potential  Good    Clinical Impairments Affecting Rehab Potential  pt to begin radiation 03/22/17    PT Frequency  2x / week    PT Duration  4 weeks    PT Treatment/Interventions  Patient/family education;Therapeutic exercise;ADLs/Self Care Home Management;Therapeutic activities;Manual lymph drainage;Manual techniques;Compression bandaging;Scar mobilization;Passive range of motion;Taping;Orthotic Fit/Training    PT Next Visit Plan  Assess indep with 3 way shoulder and rockwood, Strength ABC?, Cont AA/A/PROM to Rt shoulder, MLD Rt trunk avoiding radiated skin/tissue, myofascial to cording in inferior right breast;     Consulted and Agree with Plan of Care  Patient       Patient will benefit from skilled therapeutic intervention in order to improve the following deficits and impairments:  Pain, Increased fascial restricitons, Decreased scar mobility, Postural dysfunction, Impaired UE functional use, Decreased strength, Decreased range of motion, Increased edema, Decreased knowledge of precautions  Visit Diagnosis: Acute pain of right shoulder  Stiffness of right shoulder, not elsewhere classified  Localized edema  Muscle weakness (generalized)  Abnormal posture     Problem List Patient Active Problem List   Diagnosis Date Noted  . Genetic testing 12/24/2016  . Persistent atrial fibrillation ([place])  12/16/2016  . Long term current use of anticoagulant therapy 12/16/2016  . 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 Family history of breast cancer   . Melanoma ([place])   . Malignant neoplasm of upper-inner quadrant of right breast in female, estrogen receptor positive ([place]) 11/19/2016  . OA (osteoarthritis) of knee 01/29/2013    Allyson Sabal [facility] 04/26/2017, 3:43 PM  [place], [place], [address] Phone: [phone]   Fax:  [phone]  Name: [name] MRN: [id number] Date of Birth: 26-Mar-1941  Manus Gunning, PT 04/26/17 3:44 PM

## 2017-04-26 NOTE — Patient Instructions (Signed)
Strengthening: Resisted Flexion    Cancer Rehab [phone]    Hold tubing with left arm at side. Pull forward and up. Move shoulder through pain-free range of motion. Repeat _10___ times per set. Do _1-2___ sessions per day.  Strengthening: Resisted Internal Rotation    Hold tubing in left hand, elbow at side and forearm out. Rotate forearm in across body. Repeat _10___ times per set. Do _1-2___ sessions per day.  Strengthening: Resisted Extension    Hold tubing in left hand, arm forward. Pull arm back, elbow straight. Repeat __10__ times per set. Do __1-2__ sessions per day.   Strengthening: Resisted External Rotation    Hold tubing in left hand, elbow at side and forearm across body. Rotate forearm out. Repeat _10___ times per set. Do __1-2__ sessions per day.  Repeat on Right side.  3 Way Raises:      Starting Position:  Leaning against wall, walk feet a few inches away from the wall and make tummy tight (tuck hips underneath you) Press back/shoulders/head against wall as much as possible. Keep thumbs up to ceiling, elbows straight and shoulders relaxed/down throughout.  1. Lift arms in front to shoulder height 2. Lift arms a little wider into a "V" to shoulder height 3. Lift arms out to sides in a "T" to shoulder height  Perform 10 times in each direction. Hold 1-2 lbs to start with and work up to 2-3 sets of 10/day. Perform 3-4 times/week. Increase weight as able, decreasing sets of 10 each time you increase weights, then slowly working your way back up to 2-3 sets each time.    Cancer Rehab [phone]

## 2017-04-27 ENCOUNTER — Other Ambulatory Visit: Payer: Self-pay | Admitting: Oncology

## 2017-04-27 ENCOUNTER — Ambulatory Visit
Admission: RE | Admit: 2017-04-27 | Discharge: 2017-04-27 | Disposition: A | Discharge status: Home / Self Care | Payer: Medicare Other | Source: Ambulatory Visit | Attending: Radiation Oncology | Admitting: Radiation Oncology

## 2017-04-27 DIAGNOSIS — C50211 Malignant neoplasm of upper-inner quadrant of right female breast: Secondary | ICD-10-CM | POA: Diagnosis not present

## 2017-04-27 NOTE — Progress Notes (Unsigned)
Deseri had a bone density at [facility] 11/22/2016 showing a T score of 0.0, normal

## 2017-05-02 ENCOUNTER — Ambulatory Visit
Admission: RE | Admit: 2017-05-02 | Discharge: 2017-05-02 | Disposition: A | Discharge status: Home / Self Care | Payer: Medicare Other | Source: Ambulatory Visit | Attending: Radiation Oncology | Admitting: Radiation Oncology

## 2017-05-02 DIAGNOSIS — C50211 Malignant neoplasm of upper-inner quadrant of right female breast: Secondary | ICD-10-CM | POA: Diagnosis not present

## 2017-05-02 DIAGNOSIS — Z170 Estrogen receptor positive status [ER+]: Principal | ICD-10-CM

## 2017-05-02 MED ORDER — SONAFINE EX EMUL
1.0000 "application " | Freq: Once | CUTANEOUS | Status: AC
  Administered 2017-05-02: 1 via TOPICAL

## 2017-05-03 ENCOUNTER — Ambulatory Visit: Payer: Medicare Other

## 2017-05-03 ENCOUNTER — Ambulatory Visit
Admission: RE | Admit: 2017-05-03 | Discharge: 2017-05-03 | Disposition: A | Discharge status: Home / Self Care | Payer: Medicare Other | Source: Ambulatory Visit | Attending: Radiation Oncology | Admitting: Radiation Oncology

## 2017-05-03 ENCOUNTER — Other Ambulatory Visit: Payer: Self-pay | Admitting: Radiation Oncology

## 2017-05-03 DIAGNOSIS — C50211 Malignant neoplasm of upper-inner quadrant of right female breast: Secondary | ICD-10-CM | POA: Diagnosis not present

## 2017-05-03 MED ORDER — OXYCODONE HCL 5 MG PO TABS: 5.0000 mg | ORAL_TABLET | ORAL | 0 refills | Status: DC | PRN

## 2017-05-04 ENCOUNTER — Ambulatory Visit: Payer: Medicare Other

## 2017-05-04 ENCOUNTER — Ambulatory Visit: Payer: Medicare Other | Admitting: Physical Therapy

## 2017-05-04 ENCOUNTER — Ambulatory Visit
Admission: RE | Admit: 2017-05-04 | Discharge: 2017-05-04 | Disposition: A | Discharge status: Home / Self Care | Payer: Medicare Other | Source: Ambulatory Visit | Attending: Radiation Oncology | Admitting: Radiation Oncology

## 2017-05-04 ENCOUNTER — Encounter: Payer: Self-pay | Admitting: Physical Therapy

## 2017-05-04 DIAGNOSIS — R293 Abnormal posture: Secondary | ICD-10-CM

## 2017-05-04 DIAGNOSIS — M6281 Muscle weakness (generalized): Secondary | ICD-10-CM

## 2017-05-04 DIAGNOSIS — M25611 Stiffness of right shoulder, not elsewhere classified: Secondary | ICD-10-CM

## 2017-05-04 DIAGNOSIS — R600 Localized edema: Secondary | ICD-10-CM

## 2017-05-04 DIAGNOSIS — M25511 Pain in right shoulder: Secondary | ICD-10-CM | POA: Diagnosis not present

## 2017-05-04 DIAGNOSIS — C50211 Malignant neoplasm of upper-inner quadrant of right female breast: Secondary | ICD-10-CM | POA: Diagnosis not present

## 2017-05-04 NOTE — Therapy (Addendum)
[place], [place], [address] Phone: [phone]   Fax:  [phone]  Physical Therapy Treatment  Patient Details  Name: [name] MRN: [id number] Date of Birth: Oct 15, 1940 Referring Provider: Thimmappa   Encounter Date: 05/04/2017  PT End of Session - 05/04/17 1149    Visit Number  17    Number of Visits  2    Date for PT Re-Evaluation  06/01/17    PT Start Time  1106    PT Stop Time  1147    PT Time Calculation (min)  41 min    Activity Tolerance  Patient tolerated treatment well    Behavior During Therapy  WFL for tasks assessed/performed       Past Medical History:  Diagnosis Date  . Anemia    history of  . Anxiety   . Arthritis   . Asthma   . Breast cancer ([place])   . Cancer ([place])    skin cancers, one melanoma   . Complication of anesthesia    patient woke up during colonoscopy  . Concussion    8 yrs. ago due to being thrown from a horse  . Dislocated elbow 10/12/2016   left  . Dysrhythmia 12/2016   A-fib  . Family history of breast cancer   . Hypertension   . 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 Kidney stones    hx of   . Melanoma ([place])   . Persistent atrial fibrillation ([place]) 12/16/2016   CHA2DS2VASC score 3  . Pneumonia    hx of     Past Surgical History:  Procedure Laterality Date  . brachial skin removal due to deforimity Bilateral   . BREAST RECONSTRUCTION WITH PLACEMENT OF TISSUE EXPANDER AND FLEX HD (ACELLULAR HYDRATED DERMIS) Right 01/28/2017   Procedure: RIGHT BREAST RECONSTRUCTION WITH PLACEMENT OF TISSUE EXPANDER AND ALLODERM;  Surgeon: [name], MD;  Location: [facility];  Service: Plastics;  Laterality: Right;  . BUNIONECTOMY     left   . CATARACT EXTRACTION     bilaterla cataract surgery   . COLONOSCOPY W/ POLYPECTOMY    . KNEE ARTHROSCOPY     left   . MASTECTOMY W/ SENTINEL NODE BIOPSY Right 01/28/2017   Procedure: RIGHT TOTAL MASTECTOMY  WITH SENTINEL LYMPH NODE BIOPSY;  Surgeon: Excell Seltzer, MD;  Location: [facility];  Service: General;  Laterality: Right;  . MELANOMA EXCISION Left   . polyp removed from uterus     . right wrist pinning     . skin cancers removed    . TOTAL KNEE ARTHROPLASTY Right 01/29/2013   Procedure: RIGHT TOTAL KNEE ARTHROPLASTY;  Surgeon: Gearlean Alf, MD;  [facility];  Service: Orthopedics;  Laterality: Right;    There were no vitals filed for this visit.  Subjective Assessment - 05/04/17 1110    Subjective  They gave some gel pads to wear against my skin. Today was the last day of radiation.    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 It measures 3.5 cm and is located in the upper inner quadrant. It is ER/PR positive and HER2 negative. She had a traumatic fall 10/12/16 which resulted in a left elbow dislocation, She had a right total knee replacement in 2014 and has a left total knee replacement scheduled for 01/10/17., new onset of A-fib, underwent R mastectomy on 01/28/17, and SLNB all clear, pt has to  complete radiation    Patient Stated Goals  to be able to move my arm without any pain    Currently in Pain?  Yes    Pain Score  10-Worst pain ever    Pain Location  Axilla    Pain Orientation  Right    Pain Descriptors / Indicators  Stabbing    Pain Type  Acute pain                      [facility] Adult PT Treatment/Exercise - 05/04/17 0001      Shoulder Exercises: Standing   Other Standing Exercises  3 way shoulder with 2 lb weight x 10 reps 1 lb in left, 2 lb in right      Shoulder Exercises: Pulleys   Flexion  2 minutes    ABduction  2 minutes      Shoulder Exercises: ROM/Strengthening   Other ROM/Strengthening Exercises  Finger Ladder: Bil UE abduction 5 times each       Manual Therapy   Manual Lymphatic Drainage (MLD)  in supine, superficial and deep abdominals, right inguinal nodes and right  axillo-inguinal anastamosis, Lt axillary nodes, and interaxillary anastomosis, focused on area of swelling in right lateral trunk and axilla moving fluid towards pathways then to left sidelying to focus on posterior inter axillary anastomosis and area of swelling in back, finished in supine reestablishing pathways avoided any skin showing signs of radiation inflammation                     Long Term Clinic Goals - 05/04/17 1114      CC Long Term Goal  #1   Title  Pt to be able to independently verbalize lymphedema risk reduction practices    Baseline  03/18/17- educated pt on this today, 04/01/17- pt able to independently verbalize these    Time  4    Period  Weeks    Status  Achieved      CC Long Term Goal  #2   Title  Pt to be independent in a home exercise program for continued strengthening and stretching    Time  4    Period  Weeks    Status  On-going      CC Long Term Goal  #3   Title  Pt to demonstrate 160 degrees of right shoulder abduction to allow her to reach items out to sides    Baseline  150, 03/18/17- 160 degrees with tightness; right 173 and left 172 degrees-04/21/17    Time  4    Period  Weeks    Status  Achieved      CC Long Term Goal  #4   Title  Pt to report a 75% decrease in pain in right UE with RUE movements to allow pt to return to prior level of function    Baseline  03/18/17- 20% decrease, 04/01/17- 70% improvement; 80% improvement at this time(biggest c/o is end ROM reaching can feel inferior breast cording)-04/21/17    Time  4    Period  Weeks    Status  Achieved      CC Long Term Goal  #5   Title  Pt to report a 75% improvement in edema in right lateral trunk to allow improved comfort    Baseline  03/18/17- 25% improvement, 04/01/17- 65%; 75% improvement at this time, radiation is slwoing this down recently though-04/21/17    Time  4    Period  Weeks    Status  Achieved      CC Long Term Goal  #6   Title  Pt will report a 90%  improvement in right lateral trunk edema to allow improved comfort. Pt states this has gotten worse since her last radiation treatment.     Time  4    Period  Weeks    Status  New    Target Date  06/01/17      Additional Goals   Additional Goals  Yes         Plan - 05/04/17 1149    Clinical Impression Statement  Assessed pt's progress towards goals. She completed radiation today but has very red and irritated skin with some blisters that have scabbed over. She reports worsening of her swelling in right lateral trunk since she has not been to PT in four days. Pt would benefit from additional skilled PT services to further decrease right lateral trunk swelling to allow improved comfort and progress pt towards independence with a home exercise program.     Rehab Potential  Good    Clinical Impairments Affecting Rehab Potential  pt to begin radiation 03/22/17    PT Frequency  2x / week    PT Duration  4 weeks    PT Treatment/Interventions  Patient/family education;Therapeutic exercise;ADLs/Self Care Home Management;Therapeutic activities;Manual lymph drainage;Manual techniques;Compression bandaging;Scar mobilization;Passive range of motion;Taping;Orthotic Fit/Training    PT Next Visit Plan  Assess indep with 3 way shoulder and rockwood, Strength ABC?, Cont AA/A/PROM to Rt shoulder, MLD Rt trunk avoiding radiated skin/tissue, myofascial to cording in inferior right breast;     Consulted and Agree with Plan of Care  Patient       Patient will benefit from skilled therapeutic intervention in order to improve the following deficits and impairments:  Pain, Increased fascial restricitons, Decreased scar mobility, Postural dysfunction, Impaired UE functional use, Decreased strength, Decreased range of motion, Increased edema, Decreased knowledge of precautions  Visit Diagnosis: Acute pain of right shoulder - Plan: PT plan of care cert/re-cert  Stiffness of right shoulder, not elsewhere classified  - Plan: PT plan of care cert/re-cert  Localized edema - Plan: PT plan of care cert/re-cert  Muscle weakness (generalized) - Plan: PT plan of care cert/re-cert  Abnormal posture - Plan: PT plan of care cert/re-cert     Problem List Patient Active Problem List   Diagnosis Date Noted  . Genetic testing 12/24/2016  . Persistent atrial fibrillation ([place]) 12/16/2016  . Long term current use of anticoagulant therapy 12/16/2016  . 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 Family history of breast cancer   . Melanoma ([place])   . Malignant neoplasm of upper-inner quadrant of right breast in female, estrogen receptor positive ([place]) 11/19/2016  . OA (osteoarthritis) of knee 01/29/2013    Allyson Sabal [facility] 05/04/2017, 11:54 AM  [place], [place], [address] Phone: [phone]   Fax:  [phone]  Name: ACSA ESTEY MRN: [id number] Date of Birth: 12-Feb-1941  Manus Gunning, PT 05/04/17 11:54 AM  PHYSICAL THERAPY DISCHARGE SUMMARY  Visits from Start of Care: 17  Current functional level related to goals / functional outcomes: See above   Remaining deficits: See above   Education / Equipment: See above  Plan: Patient agrees to discharge.  Patient goals were partially met. Patient is being discharged due to not returning since the last visit.  ?????    Manus Gunning, PT  07/04/17 10:55 AM

## 2017-05-09 ENCOUNTER — Encounter: Payer: Self-pay | Admitting: Radiation Oncology

## 2017-05-09 NOTE — Progress Notes (Signed)
  Radiation Oncology         [phone]) [phone] ________________________________  Name: [name] MRN: [id number]  Date: 05/09/2017  DOB: 1940-11-23  End of Treatment Note  Diagnosis:  Invasive lobular carcinoma, pT3, pN0, pMX  Indication for treatment:  Curative, post mastectomy     Radiation treatment dates:   03/23/17-05/04/17  Site/dose:  1. CW_Rt, 50.4 Gy total delivered in 28 fractions    2. Axilla Rt, 45 Gy total delivered in 25 fractions   Beams/energy:   1. 3D, 6X/10X    2. 3D, 6X    Narrative: The patient tolerated radiation treatment relatively well.  Initially, the patient  had  pain to the site of her surgery. This improved throughout treatment.  she also developed some radiation-related skin changes for which she was given Radiaplex.   Plan: The patient has completed radiation treatment. The patient will return to radiation oncology clinic for routine followup in one month. I advised them to call or return sooner if they have any questions or concerns related to their recovery or treatment.  -----------------------------------  Blair Promise, PhD, MD  This document serves as a record of services personally performed by Gery Pray, MD. It was created on his behalf by Reola Mosher, a trained medical scribe. The creation of this record is based on the scribe's personal observations and the provider's statements to them. This document has been checked and approved by the attending provider.

## 2017-05-15 ENCOUNTER — Other Ambulatory Visit: Payer: Self-pay | Admitting: Oncology

## 2017-05-15 DIAGNOSIS — C50211 Malignant neoplasm of upper-inner quadrant of right female breast: Secondary | ICD-10-CM

## 2017-05-15 DIAGNOSIS — Z170 Estrogen receptor positive status [ER+]: Principal | ICD-10-CM

## 2017-06-08 ENCOUNTER — Encounter: Payer: Self-pay | Admitting: Oncology

## 2017-06-09 ENCOUNTER — Ambulatory Visit
Admission: RE | Admit: 2017-06-09 | Discharge: 2017-06-09 | Disposition: A | Discharge status: Home / Self Care | Payer: Medicare Other | Source: Ambulatory Visit | Attending: Radiation Oncology | Admitting: Radiation Oncology

## 2017-06-09 ENCOUNTER — Other Ambulatory Visit: Payer: Self-pay

## 2017-06-09 ENCOUNTER — Encounter: Payer: Self-pay | Admitting: Radiation Oncology

## 2017-06-09 DIAGNOSIS — Z79811 Long term (current) use of aromatase inhibitors: Secondary | ICD-10-CM | POA: Diagnosis not present

## 2017-06-09 DIAGNOSIS — Z885 Allergy status to narcotic agent status: Secondary | ICD-10-CM | POA: Diagnosis not present

## 2017-06-09 DIAGNOSIS — Z79899 Other long term (current) drug therapy: Secondary | ICD-10-CM | POA: Diagnosis not present

## 2017-06-09 DIAGNOSIS — C50211 Malignant neoplasm of upper-inner quadrant of right female breast: Secondary | ICD-10-CM | POA: Diagnosis present

## 2017-06-09 DIAGNOSIS — Z7901 Long term (current) use of anticoagulants: Secondary | ICD-10-CM | POA: Diagnosis not present

## 2017-06-09 DIAGNOSIS — Z170 Estrogen receptor positive status [ER+]: Secondary | ICD-10-CM | POA: Diagnosis not present

## 2017-06-09 NOTE — Progress Notes (Signed)
Radiation Oncology         [phone] ________________________________  Name: [name] MRN: [id number]  Date: 06/09/2017  DOB: 04-Aug-1940  Follow-Up Visit Note  CC: [name], MD  Excell Seltzer, MD    ICD-10-CM   1. Malignant neoplasm of upper-inner quadrant of right breast in female, estrogen receptor positive ([place]) C50.211    Z17.0     Diagnosis:   Invasive lobular carcinoma, pT3, pN0, pMX  Interval Since Last Radiation:  5 weeks  Narrative:  The patient returns today for routine follow-up accompanied by her husband. She states she is doing well overall. She reports an intermittent pain to the right chest wall but states it is very rare. She reports her energy level has returned to normal. She denies any fatigue or skin irritation but does reports some hyperpigmentation. She is currently taking Letrozole.                                ALLERGIES:  is allergic to chlorhexidine gluconate and codeine.  Meds: Current Outpatient Medications  Medication Sig Dispense Refill  . Ascorbic Acid (VITAMIN C PO) Take 1,000 mg by mouth 2 (two) times daily.    Marland Kitchen BREO ELLIPTA 100-25 MCG/INH AEPB Inhale 1 puff into the lungs daily.    . cholecalciferol (VITAMIN D) 1000 units tablet Take 1,000 Units by mouth daily.    Marland Kitchen desonide (DESOWEN) 0.05 % cream Apply 1 application topically 2 (two) times daily as needed (for skin irritation.).    Marland Kitchen FOLIC ACID PO Take 1 tablet by mouth daily.    Marland Kitchen letrozole (FEMARA) 2.5 MG tablet TAKE 1 TABLET BY MOUTH EVERY DAY 30 tablet 2  . levocetirizine (XYZAL) 5 MG tablet Take 5 mg by mouth at bedtime.     . Methylsulfonylmethane (MSM PO) Take by mouth.    Marland Kitchen MILK THISTLE PO Take 1 capsule by mouth 3 (three) times daily.    . NON FORMULARY Take 1 capsule by mouth daily. Natto-K    . omega-3 acid ethyl esters (LOVAZA) 1 g capsule Take 1 g by mouth daily.    Marland Kitchen oxyCODONE (OXY IR/ROXICODONE) 5 MG immediate release tablet Take 1 tablet (5 mg total) by  mouth every 4 (four) hours as needed for severe pain. 30 tablet 0  . Potassium 99 MG TABS Take 99 mg by mouth daily.     . Probiotic Product (PROBIOTIC PO) Take 1 capsule by mouth daily as needed (stomach).    . propranolol ER (INDERAL LA) 80 MG 24 hr capsule Take 80 mg by mouth daily.    . ranitidine (ZANTAC) 150 MG capsule Take 1 capsule (150 mg total) by mouth daily. 30 capsule 0  . Red Yeast Rice Extract POWD Take 1 tablet by mouth daily.    . RESTASIS 0.05 % ophthalmic emulsion Place 1 drop into both eyes daily.     . rivaroxaban (XARELTO) 20 MG TABS tablet Take 1 tablet (20 mg total) by mouth daily with supper. May resume this starting February 02, 2017  0  . tetrahydrozoline-zinc (VISINE-AC) 0.05-0.25 % ophthalmic solution Place 2 drops into both eyes 3 (three) times daily as needed (for allergy eyes/dry eye).    Marland Kitchen triamterene-hydrochlorothiazide (MAXZIDE-25) 37.5-25 MG per tablet Take 1 tablet by mouth every morning.     . TURMERIC PO Take 1 capsule by mouth daily.    Marland Kitchen UBIQUINOL PO Take 1 tablet by  mouth daily.    Marland Kitchen venlafaxine XR (EFFEXOR XR) 75 MG 24 hr capsule Take 1 capsule (75 mg total) by mouth daily with breakfast. 90 capsule 2  . albuterol (PROVENTIL HFA;VENTOLIN HFA) 108 (90 Base) MCG/ACT inhaler Inhale 2 puffs into the lungs every 6 (six) hours as needed. For wheezing/shortness of breath    . ALPRAZolam (XANAX) 0.5 MG tablet Take 0.5-1 mg by mouth daily as needed for anxiety.    . B Complex-C (B-COMPLEX WITH VITAMIN C) tablet Take 1 tablet by mouth daily.    . methocarbamol (ROBAXIN) 500 MG tablet Take 1 tablet (500 mg total) by mouth every 8 (eight) hours as needed for muscle spasms. (Patient not taking: Reported on 06/09/2017) 30 tablet 0   No current facility-administered medications for this encounter.     Physical Findings: The patient is in no acute distress. Patient is alert and oriented.  height is 5' 0.5" (1.537 m) and weight is 138 lb (62.6 kg). Her oral temperature is  99.2 F (37.3 C). Her blood pressure is 123/87 and her pulse is 73. Her oxygen saturation is 100%. .    Lungs are clear to auscultation bilaterally. Heart has regular rate and rhythm. No palpable cervical, supraclavicular, or axillary adenopathy. Abdomen soft, non-tender, normal bowel sounds. Left breast with no palpable mass or nipple discharge. Right chest wall shows hyperpigmentation changes with mastectomy scar. Tissue expander in place. Skin is well healed.  Lab Findings: Lab Results  Component Value Date   WBC 6.6 04/25/2017   HGB 13.5 04/25/2017   HCT 41.0 04/25/2017   MCV 97.9 04/25/2017   PLT 199 04/25/2017    Radiographic Findings: No results found.  Impression:   Invasive lobular carcinoma, pT3, pN0, pMX  The patient is recovering from the effects of radiation. No evidence of recurrence on clinical exam.  Plan:  Routine follow up in three months. Patient will continue on Letrozole. Anticipate reconstructive surgery in 6-12 months.   ____________________________________ -----------------------------------  Blair Promise, PhD, MD   This document serves as a record of services personally performed by Gery Pray, MD. It was created on his behalf by Marlowe Kays, a trained medical scribe. The creation of this record is based on the scribe's personal observations and the provider's statements to them. This document has been checked and approved by the attending provider.

## 2017-06-09 NOTE — Progress Notes (Signed)
[name] is here for follow up after treatment to her right chest wall.  She denies having pain or fatigue.  She is taking letrozole.  The skin on her right chest has hyperpigmentation.  She is waiting to schedule her left knee replacement in February.  BP 123/87 (BP Location: Left Arm, Patient Position: Sitting)   Pulse 73   Temp 99.2 F (37.3 C) (Oral)   Ht 5' 0.5" (1.537 m)   Wt 138 lb (62.6 kg)   SpO2 100%   BMI 26.51 kg/m    Wt Readings from Last 3 Encounters:  06/09/17 138 lb (62.6 kg)  04/25/17 138 lb (62.6 kg)  03/14/17 139 lb 1.6 oz (63.1 kg)

## 2017-07-18 NOTE — Progress Notes (Signed)
[facility]  [phone] Fax:(336) [phone]     ID: AMARRIA ANDREASEN DOB: May 30, 1941  MR#: [id number]  GYI#:[id number]  Patient Care Team: [name], MD as PCP - General (Family Medicine) Excell Seltzer, MD as Consulting Physician (General Surgery) Magrinat, Virgie Dad, MD as Consulting Physician (Oncology) Gery Pray, MD as Consulting Physician (Radiation Oncology) Lorelle Gibbs, MD (Radiology) Gaynelle Arabian, MD as Consulting Physician (Orthopedic Surgery) Harold Hedge, Darrick Grinder, MD as Consulting Physician (Allergy and Immunology) Teena Irani, MD (Inactive) as Consulting Physician (Gastroenterology) [name], MD as Consulting Physician (Plastic Surgery) OTHER MD:  CHIEF COMPLAINT: Estrogen receptor positive breast cancer  CURRENT TREATMENT:  Adjuvant radiation ongoing; letrozole   BREAST CANCER HISTORY: From the original intake note:  The patient has had concerns in the right breast dating back to about 3 years when her primary care physician Dr. [name] noted a change. The breast has been followed very closely but no diagnosis was established. She was followed with annual diagnostic mammography and on 11/11/2016 bilateral diagnostic mammography with tomography at [facility] found the breast density to be category C. There was a new 3.5 cm irregular spiculated mass in the right breast upper inner quadrant. Ultrasound was obtained the same day and confirmed a 3.5 cm solid mass in the right breast upper inner quadrant, with no abnormalities noted in the right axilla.  Biopsy of the right breast mass in question 11/15/2013 showed ([id number] invasive lobular carcinoma, E-cadherin negative, grade 1 or 2, estrogen receptor 90% positive, progesterone receptor 2% positive, both with strong staining intensity, with an MIB-1 of 5, and no HER-2 amplification, the signals ratio being 1.14 and the number per cell 1.20.  Her subsequent history is  as detailed below  INTERVAL HISTORY: [name] returns today for follow-up and treatment of her estrogen receptor positive breast cancer, accompanied by her husband, [name]. She continues on letrozole, with good tolerance. She denies any issues at this time.   Since her last visit to the office, she completed radiation with treatment dates of: 03/23/17-05/04/17 treated to the site and dose of: 1. CW_Rt, 50.4 Gy total delivered in 28 fractions and 2. Axilla Rt, 45 Gy total delivered in 25 fractions.    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 She has had left shoulder pain without injury, but contributes it to being overuse due to reaching behind her to grab the remote. She has had a cortisone replacement. She will have a knee replacement in March 2019 by Dr. Maureen Ralphs and she will have reconstruction surgery completed by Dr. Iran Planas for implants. She has expanders in place now and she is unsure of the type of implant that she will obtain. She had a right shin injury in January 2019 and had 30 stitches. She has resolved foot swelling. She denies unusual headaches, visual changes, nausea, vomiting, or dizziness. There has been no unusual cough, phlegm production, or pleurisy. This been no change in bowel or bladder habits. She denies unexplained fatigue or unexplained weight loss, bleeding, rash, or fever. A detailed review of systems was otherwise stable.     PAST MEDICAL HISTORY: Past Medical History:  Diagnosis Date  . Anemia    history of  . Anxiety   . Arthritis   . Asthma   . Breast cancer ([place])   . Cancer ([place])    skin cancers, one melanoma   . Complication of  anesthesia    patient woke up during colonoscopy  . Concussion    8 yrs. ago due to being thrown from a horse  . Dislocated elbow 10/12/2016   left  . Dysrhythmia 12/2016   A-fib  . Family history of breast cancer   . History of  radiation therapy 03/23/17-05/04/17   right chest wall 50.4 Gy in 28 fractions, right axilla 45 Gy in 25 fractions  . Hypertension   . 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 Kidney stones    hx of   . Melanoma ([place])   . Persistent atrial fibrillation ([place]) 12/16/2016   CHA2DS2VASC score 3  . Pneumonia    hx of     PAST SURGICAL HISTORY: Past Surgical History:  Procedure Laterality Date  . brachial skin removal due to deforimity Bilateral   . BREAST RECONSTRUCTION WITH PLACEMENT OF TISSUE EXPANDER AND FLEX HD (ACELLULAR HYDRATED DERMIS) Right 01/28/2017   Procedure: RIGHT BREAST RECONSTRUCTION WITH PLACEMENT OF TISSUE EXPANDER AND ALLODERM;  Surgeon: [name], MD;  Location: [facility];  Service: Plastics;  Laterality: Right;  . BUNIONECTOMY     left   . CATARACT EXTRACTION     bilaterla cataract surgery   . COLONOSCOPY W/ POLYPECTOMY    . KNEE ARTHROSCOPY     left   . MASTECTOMY W/ SENTINEL NODE BIOPSY Right 01/28/2017   Procedure: RIGHT TOTAL MASTECTOMY WITH SENTINEL LYMPH NODE BIOPSY;  Surgeon: Excell Seltzer, MD;  Location: [facility];  Service: General;  Laterality: Right;  . MELANOMA EXCISION Left   . polyp removed from uterus     . right wrist pinning     . skin cancers removed    . TOTAL KNEE ARTHROPLASTY Right 01/29/2013   Procedure: RIGHT TOTAL KNEE ARTHROPLASTY;  Surgeon: Gearlean Alf, MD;  Location: [facility];  Service: Orthopedics;  Laterality: Right;    FAMILY HISTORY Family History  Problem Relation Age of Onset  . COPD Father   . Breast cancer Other        dx in her 62s-30s  The patient's father died at the age of 74 from complications of emphysema and alcohol abuse. The patient's mother died from heart disease at age 17. The patient was an only child. The only relative with breast cancer was a maternal great aunt was diagnosed in her 38s. There is no history of ovarian cancer in the family.  GYNECOLOGIC HISTORY:  No LMP recorded. Patient is  postmenopausal. Menarche age 52, first live birth age 60, she is [employer] P1. She stopped having periods in 1994. She was using intravaginal Premarin until her breast cancer diagnosis June 2018. She used oral contraceptives for approximately one year remotely with no complications.  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 Her son from her first marriage, Sabino Snipes, lives in [place] and is an Sales promotion account executive. He has 2 children. The patient's second husband, [name], is a retired Film/video editor. He has a son in [place], who works as a Marine scientist. [name] has 5 grandchildren of his own. The patient is a Psychologist, forensic.    ADVANCED DIRECTIVES: In place. The patient tells me she has named her husband [name] and her son Sherren Mocha as healthcare part of attorney   HEALTH MAINTENANCE: Social History   Tobacco Use  . Smoking status: Never Smoker  . Smokeless tobacco: Never Used  Substance Use Topics  . Alcohol use: Yes    Comment: occ  . Drug use: No  Colonoscopy:2015  PAP:  Bone density: 01/27/2015 at [facility] T score -0.3   Allergies  Allergen Reactions  . Chlorhexidine Gluconate Hives, Swelling and Rash    SWELLING REACTION UNSPECIFIED    "ChloraPrep "  . Codeine Nausea Only    Current Outpatient Medications  Medication Sig Dispense Refill  . albuterol (PROVENTIL HFA;VENTOLIN HFA) 108 (90 Base) MCG/ACT inhaler Inhale 2 puffs into the lungs every 6 (six) hours as needed. For wheezing/shortness of breath    . ALPRAZolam (XANAX) 0.5 MG tablet Take 0.5-1 mg by mouth daily as needed for anxiety.    . Ascorbic Acid (VITAMIN C PO) Take 1,000 mg by mouth 2 (two) times daily.    . B Complex-C (B-COMPLEX WITH VITAMIN C) tablet Take 1 tablet by mouth daily.    Marland Kitchen BREO ELLIPTA 100-25 MCG/INH AEPB Inhale 1 puff into the lungs daily.    . cholecalciferol (VITAMIN D) 1000 units tablet Take 1,000 Units by mouth daily.    Marland Kitchen desonide (DESOWEN) 0.05 % cream Apply 1 application  topically 2 (two) times daily as needed (for skin irritation.).    Marland Kitchen FOLIC ACID PO Take 1 tablet by mouth daily.    Marland Kitchen letrozole (FEMARA) 2.5 MG tablet TAKE 1 TABLET BY MOUTH EVERY DAY 30 tablet 2  . levocetirizine (XYZAL) 5 MG tablet Take 5 mg by mouth at bedtime.     . methocarbamol (ROBAXIN) 500 MG tablet Take 1 tablet (500 mg total) by mouth every 8 (eight) hours as needed for muscle spasms. (Patient not taking: Reported on 06/09/2017) 30 tablet 0  . Methylsulfonylmethane (MSM PO) Take by mouth.    Marland Kitchen MILK THISTLE PO Take 1 capsule by mouth 3 (three) times daily.    . NON FORMULARY Take 1 capsule by mouth daily. Natto-K    . omega-3 acid ethyl esters (LOVAZA) 1 g capsule Take 1 g by mouth daily.    Marland Kitchen oxyCODONE (OXY IR/ROXICODONE) 5 MG immediate release tablet Take 1 tablet (5 mg total) by mouth every 4 (four) hours as needed for severe pain. 30 tablet 0  . Potassium 99 MG TABS Take 99 mg by mouth daily.     . Probiotic Product (PROBIOTIC PO) Take 1 capsule by mouth daily as needed (stomach).    . propranolol ER (INDERAL LA) 80 MG 24 hr capsule Take 80 mg by mouth daily.    . ranitidine (ZANTAC) 150 MG capsule Take 1 capsule (150 mg total) by mouth daily. 30 capsule 0  . Red Yeast Rice Extract POWD Take 1 tablet by mouth daily.    . RESTASIS 0.05 % ophthalmic emulsion Place 1 drop into both eyes daily.     . rivaroxaban (XARELTO) 20 MG TABS tablet Take 1 tablet (20 mg total) by mouth daily with supper. May resume this starting February 02, 2017  0  . tetrahydrozoline-zinc (VISINE-AC) 0.05-0.25 % ophthalmic solution Place 2 drops into both eyes 3 (three) times daily as needed (for allergy eyes/dry eye).    Marland Kitchen triamterene-hydrochlorothiazide (MAXZIDE-25) 37.5-25 MG per tablet Take 1 tablet by mouth every morning.     . TURMERIC PO Take 1 capsule by mouth daily.    Marland Kitchen UBIQUINOL PO Take 1 tablet by mouth daily.    Marland Kitchen venlafaxine XR (EFFEXOR XR) 75 MG 24 hr capsule Take 1 capsule (75 mg total) by mouth  daily with breakfast. 90 capsule 2   No current facility-administered medications for this visit.     OBJECTIVE: Middle-aged white woman  who appears stated age  77:   07/25/17 1441  BP: 129/88  Pulse: 90  Resp: 20  Temp: 98.1 F (36.7 C)  SpO2: 96%     Body mass index is 27.22 kg/m.   Filed Weights   07/25/17 1441  Weight: 141 lb 11.2 oz (64.3 kg)       ECOG FS:1 - Symptomatic but completely ambulatory  Sclerae unicteric, EOMs intact Oropharynx clear and moist No cervical or supraclavicular adenopathy Lungs no rales or rhonchi Heart regular rate and rhythm Abd soft, nontender, positive bowel sounds MSK no focal spinal tenderness, no upper extremity lymphedema Neuro: nonfocal, well oriented, appropriate affect Breasts: The right breast is status post mastectomy with expander in place.  There is no evidence of local recurrence.  The left breast is benign.  Both axillae are benign.  LAB RESULTS:  CMP     Component Value Date/Time   NA 137 07/25/2017 1408   NA 134 (L) 04/25/2017 1216   K 3.7 07/25/2017 1408   K 4.3 04/25/2017 1216   CL 101 07/25/2017 1408   CO2 25 07/25/2017 1408   CO2 29 04/25/2017 1216   GLUCOSE 90 07/25/2017 1408   GLUCOSE 85 04/25/2017 1216   BUN 14 07/25/2017 1408   BUN 14.4 04/25/2017 1216   CREATININE 0.79 07/25/2017 1408   CREATININE 0.7 04/25/2017 1216   CALCIUM 9.9 07/25/2017 1408   CALCIUM 10.0 04/25/2017 1216   PROT 7.3 07/25/2017 1408   PROT 7.2 04/25/2017 1216   ALBUMIN 3.6 07/25/2017 1408   ALBUMIN 3.7 04/25/2017 1216   AST 21 07/25/2017 1408   AST 21 04/25/2017 1216   ALT 7 07/25/2017 1408   ALT 10 04/25/2017 1216   ALKPHOS 92 07/25/2017 1408   ALKPHOS 75 04/25/2017 1216   BILITOT 0.6 07/25/2017 1408   BILITOT 0.65 04/25/2017 1216   GFRNONAA >60 07/25/2017 1408   GFRAA >60 07/25/2017 1408    No results found for: TOTALPROTELP, ALBUMINELP, A1GS, A2GS, BETS, BETA2SER, GAMS, MSPIKE, SPEI  No results found for:  Nils Pyle, [facility]  Lab Results  Component Value Date   WBC 7.2 07/25/2017   NEUTROABS 4.8 07/25/2017   HGB 13.5 04/25/2017   HCT 39.9 07/25/2017   MCV 100.3 07/25/2017   PLT 287 07/25/2017      Chemistry      Component Value Date/Time   NA 137 07/25/2017 1408   NA 134 (L) 04/25/2017 1216   K 3.7 07/25/2017 1408   K 4.3 04/25/2017 1216   CL 101 07/25/2017 1408   CO2 25 07/25/2017 1408   CO2 29 04/25/2017 1216   BUN 14 07/25/2017 1408   BUN 14.4 04/25/2017 1216   CREATININE 0.79 07/25/2017 1408   CREATININE 0.7 04/25/2017 1216      Component Value Date/Time   CALCIUM 9.9 07/25/2017 1408   CALCIUM 10.0 04/25/2017 1216   ALKPHOS 92 07/25/2017 1408   ALKPHOS 75 04/25/2017 1216   AST 21 07/25/2017 1408   AST 21 04/25/2017 1216   ALT 7 07/25/2017 1408   ALT 10 04/25/2017 1216   BILITOT 0.6 07/25/2017 1408   BILITOT 0.65 04/25/2017 1216       No results found for: LABCA2  No components found for: [id number]  No results for input(s): INR in the last 168 hours.  Urinalysis    Component Value Date/Time   COLORURINE YELLOW 01/22/2013 1345   APPEARANCEUR CLEAR 01/22/2013 1345   LABSPEC 1.011 01/22/2013 1345   PHURINE 6.5 01/22/2013  [place] 01/22/2013 1345   HGBUR TRACE (A) 01/22/2013 1345   BILIRUBINUR NEGATIVE 01/22/2013 1345   KETONESUR NEGATIVE 01/22/2013 1345   PROTEINUR NEGATIVE 01/22/2013 1345   UROBILINOGEN 0.2 01/22/2013 1345   NITRITE NEGATIVE 01/22/2013 1345   LEUKOCYTESUR NEGATIVE 01/22/2013 1345     STUDIES: Next mammography due late July   ELIGIBLE FOR AVAILABLE RESEARCH PROTOCOL: no  ASSESSMENT: 77 y.o. [place] woman status post right breast upper outer quadrant biopsy 11/15/2016 for a clinical T2 N0, stage IB invasive lobular carcinoma, grade 1 or 2, estrogen and progesterone receptor positive, HER-2 nonamplified, with an MIB-1 of 5%.  (1) genetics testing 12/02/2016 through the hereditary Gene Panel  offered by [facility] found no deleterious mutations in APC, ATM, AXIN2, BARD1, BMPR1A, BRCA1, BRCA2, BRIP1, CDH1, CDKN2A (p14ARF), CDKN2A (p16INK4a), CHEK2, CTNNA1, DICER1, EPCAM (Deletion/duplication testing only), GREM1 (promoter region deletion/duplication testing only), KIT, MEN1, MLH1, MSH2, MSH3, MSH6, MUTYH, NBN, NF1, NHTL1, PALB2, PDGFRA, PMS2, POLD1, POLE, PTEN, RAD50, RAD51C, RAD51D, SDHB, SDHC, SDHD, SMAD4, SMARCA4. STK11, TP53, TSC1, TSC2, and VHL.  The following genes were evaluated for sequence changes only: SDHA and HOXB13 c.251G>A variant only.    (2) anastrozole started 11/24/2016  (3) Status post right mastectomy with sentinel lymph node sampling 01/28/2017 for apT3 pN0, invasive lobular carcinoma, grade 1, with negative margins.   (4) Oncotype DX score of 21 predicts a 10 year risk of recurrence outside the breast of 14% if the patient's only systemic therapy is tamoxifen for 5 years. It also predicts no significant benefit from chemotherapy.  (5) adjuvant radiation to be completed 05/04/2017  (6) continue letrozole, 10 year treatment plan as for lobular tumors  (a) bone density August 2016 was normal  (b) bone density at [facility] 11/22/2016 showed a T score of 0.0 (normal    PLAN: [name] is now 6 months out from definitive surgery for her breast cancer.  She is tolerating letrozole well.  The plan will be to continue that for a total of 10 years since she has a lobular breast cancer.  She has bursitis in the left shoulder, which could not be evaluated by MRI because of the expander in place.  She has an ultrasound scheduled.  She will have her left knee replaced in March.  She will follow that up with permanent implant on the right.  By the time I see her in August of this year all that should be in place.  At that time I will probably start seeing her on a once a year basis.    She knows to call for any problems that may develop before that visit. Magrinat, Virgie Dad, MD   07/25/17 2:59 PM Medical Oncology and Hematology [facility] [address] [place], [place] [address] Tel. [phone]    Fax. [phone]    This document serves as a record of services personally performed by Lurline Del, MD. It was created on his behalf by Steva Colder, a trained medical scribe. The creation of this record is based on the scribe's personal observations and the provider's statements to them.   I have reviewed the above documentation for accuracy and completeness, and I agree with the above.

## 2017-07-25 ENCOUNTER — Telehealth: Payer: Self-pay | Admitting: Oncology

## 2017-07-25 ENCOUNTER — Inpatient Hospital Stay: Payer: Medicare Other | Attending: Oncology | Admitting: Oncology

## 2017-07-25 ENCOUNTER — Other Ambulatory Visit: Payer: Self-pay | Admitting: *Deleted

## 2017-07-25 ENCOUNTER — Inpatient Hospital Stay: Payer: Medicare Other

## 2017-07-25 VITALS — BP 129/88 | HR 90 | Temp 98.1°F | Resp 20 | Ht 60.5 in | Wt 141.7 lb

## 2017-07-25 DIAGNOSIS — I4891 Unspecified atrial fibrillation: Secondary | ICD-10-CM | POA: Diagnosis not present

## 2017-07-25 DIAGNOSIS — Z170 Estrogen receptor positive status [ER+]: Principal | ICD-10-CM

## 2017-07-25 DIAGNOSIS — Z79899 Other long term (current) drug therapy: Secondary | ICD-10-CM

## 2017-07-25 DIAGNOSIS — F419 Anxiety disorder, unspecified: Secondary | ICD-10-CM | POA: Diagnosis not present

## 2017-07-25 DIAGNOSIS — Z79811 Long term (current) use of aromatase inhibitors: Secondary | ICD-10-CM | POA: Insufficient documentation

## 2017-07-25 DIAGNOSIS — Z85820 Personal history of malignant melanoma of skin: Secondary | ICD-10-CM | POA: Insufficient documentation

## 2017-07-25 DIAGNOSIS — C50211 Malignant neoplasm of upper-inner quadrant of right female breast: Secondary | ICD-10-CM

## 2017-07-25 DIAGNOSIS — J45909 Unspecified asthma, uncomplicated: Secondary | ICD-10-CM | POA: Diagnosis not present

## 2017-07-25 DIAGNOSIS — Z7901 Long term (current) use of anticoagulants: Secondary | ICD-10-CM | POA: Diagnosis not present

## 2017-07-25 LAB — CMP (CANCER CENTER ONLY)
ALBUMIN: 3.6 g/dL (ref 3.5–5.0)
ALK PHOS: 92 U/L (ref 40–150)
ALT: 7 U/L (ref 0–55)
ANION GAP: 11 (ref 3–11)
AST: 21 U/L (ref 5–34)
BUN: 14 mg/dL (ref 7–26)
CALCIUM: 9.9 mg/dL (ref 8.4–10.4)
CO2: 25 mmol/L (ref 22–29)
Chloride: 101 mmol/L (ref 98–109)
Creatinine: 0.79 mg/dL (ref 0.60–1.10)
GFR, Est AFR Am: >60 mL/min (ref >60)
GFR, Estimated: >60 mL/min (ref >60)
GLUCOSE: 90 mg/dL (ref 70–140)
Potassium: 3.7 mmol/L (ref 3.5–5.1)
SODIUM: 137 mmol/L (ref 136–145)
Total Bilirubin: 0.6 mg/dL (ref 0.2–1.2)
Total Protein: 7.3 g/dL (ref 6.4–8.3)

## 2017-07-25 LAB — CBC WITH DIFFERENTIAL (CANCER CENTER ONLY)
BASOS PCT: 1 %
Basophils Absolute: 0 10*3/uL (ref 0.0–0.1)
Eosinophils Absolute: 0.5 10*3/uL (ref 0.0–0.5)
Eosinophils Relative: 6 %
HEMATOCRIT: 39.9 % (ref 34.8–46.6)
Hemoglobin: 13.4 g/dL (ref 11.6–15.9)
LYMPHS PCT: 15 %
Lymphs Abs: 1.1 10*3/uL (ref 0.9–3.3)
MCH: 33.6 pg (ref 25.1–34.0)
MCHC: 33.5 g/dL (ref 31.5–36.0)
MCV: 100.3 fL (ref 79.5–101.0)
MONO ABS: 0.9 10*3/uL (ref 0.1–0.9)
MONOS PCT: 12 %
NEUTROS ABS: 4.8 10*3/uL (ref 1.5–6.5)
Neutrophils Relative %: 66 %
Platelet Count: 287 10*3/uL (ref 145–400)
RBC: 3.98 MIL/uL (ref 3.70–5.45)
RDW: 13.9 % (ref 11.2–14.5)
WBC Count: 7.2 10*3/uL (ref 3.9–10.3)

## 2017-07-25 NOTE — Telephone Encounter (Signed)
Gave avs and calendar for august  °

## 2017-07-27 NOTE — Progress Notes (Signed)
Please place orders in Epic as patient is being scheduled for a pre-op appointment! Thank you! 

## 2017-07-31 ENCOUNTER — Ambulatory Visit: Payer: Self-pay | Admitting: Orthopedic Surgery

## 2017-08-11 ENCOUNTER — Other Ambulatory Visit: Payer: Self-pay | Admitting: Oncology

## 2017-08-11 DIAGNOSIS — Z170 Estrogen receptor positive status [ER+]: Principal | ICD-10-CM

## 2017-08-11 DIAGNOSIS — C50211 Malignant neoplasm of upper-inner quadrant of right female breast: Secondary | ICD-10-CM

## 2017-08-19 ENCOUNTER — Ambulatory Visit: Payer: Self-pay | Admitting: Orthopedic Surgery

## 2017-08-19 NOTE — H&P (Signed)
[name], [name] [phone], F) DOB [birth year]   Chief Complaint Left Knee Pain H&P left total knee 08-29-2017  Patient's Care Team Primary Care Provider: [name] MD: [place], [place], [place] [address], Ph [phone], Fax [phone] NPI: [id number] Patient's Pharmacies [id number] [address], [place] [address], Ph [phone], Fax [phone]   Vitals Ht: 5 ft 0.5 in  Wt: 140 lbs BMI: 26.9  BP: Not Performed - Not tolerated  Pulse: 68 bpm irregular    Allergies Reviewed Allergies CHLORHEXIDINE: - Redness, Swelling  CODEINE: Nausea    Medications Reviewed Medications Anti-Oxidant 08/19/17   entered ALEXZANDREW PERKINS, PA-C Breo Ellipta 100 mcg-25 mcg/dose powder for inhalation 06/16/17   filled [employer]IONS collagen (bovine) 08/19/17   entered ALEXZANDREW PERKINS, PA-C diclofenac 1 % topical gel APPLY 2 GRAM TO THE AFFECTED AREA(S) BY TOPICAL ROUTE 4 TIMES PER DAY 08/11/17   filled PRES[employer]UTIONS letrozole 2.5 mg tablet TAKE 1 TABLET BY MOUTH EVERY DAY 08/11/17   filled [employer] SOLUTIONS levocetirizine 5 mg tablet 07/30/17   filled PRES[employer] SOLUTIONS milk thistle 08/19/17   entered ALEXZANDREW PERKINS, PA-C MSM 08/19/17   entered ALEXZANDREW PERKINS, PA-C potassium 08/19/17   entered ALEXZANDREW PERKINS, PA-C propranolol ER 80 mg capsule,24 hr,extended release 07/07/17   filled [employer]IONS raNITIdine 150 mg tablet 06/17/17   filled PRES[employer] SOLUTIONS red yeast rice 08/19/17   entered ALEXZANDREW PERKINS, PA-C Restasis 0.05 % eye drops in a dropperette 03/22/17   filled [employer]IONS resveratrol 08/19/17   entered ALEXZANDREW PERKINS, PA-C triamterene 08/19/17   entered ALEXZANDREW PERKINS, PA-C triamterene 37.5 mg-hydrochlorothiazide 25 mg tablet 12/29/16   filled [employer]IONS turmeric 08/19/17   entered ALEXZANDREW PERKINS, PA-C ubiquinol  (bulk) 08/19/17   entered ALEXZANDREW PERKINS, PA-C venlafaxine ER 75 mg capsule,extended release 24 hr 07/04/17   filled [employer]IONS Vitamin C 08/19/17   entered ALEXZANDREW PERKINS, PA-C Vitamin D3 08/19/17   entered ALEXZANDREW PERKINS, PA-C Xarelto 20 mg tablet 06/05/17   filled PRESCRIPTION SOLUTIONS              Problems Reviewed Problems Anxiety - Onset: 06/15/2017 Varicose vein finding - Onset: 06/15/2017 Joint pain - Onset: 06/15/2017 Impingement syndrome of shoulder region - Onset: 07/13/2017, Left Easy bruising - Onset: 06/15/2017 Laceration of lower leg - Onset: 06/21/2017, Right Pain of left shoulder joint - Onset: 08/04/2017   Family History Reviewed Family History Father - Father deceased   - Myocardial infarction   - Chronic obstructive lung disease   - Alcoholism Mother - Mother deceased   - Atherosclerosis   Social History Reviewed Social History Smoking Status: Never smoker Alcohol intake: Occasional Hand Dominance: Right Work related injury?: N Exercise level: 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, government, Weights, Yoga) Advance directive: Y (Notes: Living Will) Medical Power of Attorney: Y   Surgical History Reviewed Surgical History Closure of skin by suture Reduction of dislocation of elbow - 2018 Knee Surgery - 2014 Mastectomy - 2018 Arthroscopy - 2014 Bunionectomy - 2003    Past Medical History Reviewed Past Medical History Anemia: Y Anxiety Disorder: Y Asthma: Y Cancer: Y GERD/Reflux: Y Heart Problems: Y Hypertension: Y Irregular Heartbeat: Y - Atrial Fib Joint Pain: Y Osteoarthritis: Y Previous Cortisone Injection(s): Y Previous Fracture(s): Y Previous Oral Steroid(s): Y Swelling of Legs/Feet/Hands: Y Notes: Shingles,  Cataracts,  History of Bronchitis,  History of Pneumonia,  Diverticulosis,  Breast Cancer - Right Side,  Basal Cell Skin Cancer,  Melanoma Skin Cancer,  History of  Anemia,  Peripheral Edema,   Postmeonpausal   HPI The patient is here today for a pre-operative History and Physical. They are scheduled for left total knee replacement on 08-29-2017 with Dr. Wynelle Link at [facility]. The patient is being followed for their left knee pain and osteoarthritis. Symptoms reported include: pain, pain after sitting, pain at night, locking, catching, pain with weightbearing and difficulty arising from chair. The patient feels that they are doing poorly. The patient has reported only temporary improvement of their symptoms with: conservative measures and Cortisone injections. The knee had been catching or locking just achy discomfort. She was previously been scheduled for LEFT total knee arthroplasty but had to have it cancelled due to onset of atrial fibrillation and also a diagnosis of breast cancer. She has completed treatment for the breast cancer and says the atrial fibrillation is under good control. The knee is hurting at all times and limiting what she can and cannot do. She is now ready to proceed with surgery. She has been rescheduled and ready for the left total knee replacement.   ROS Constitutional: Constitutional: no fever, chills, or significant weight loss and night sweats.  Cardiovascular: Cardiovascular: no palpitations or chest pain.  Respiratory: Respiratory: no cough or shortness of breath and No COPD.  Gastrointestinal: Gastrointestinal: no vomiting or nausea.  Musculoskeletal: Musculoskeletal: Joint Pain and swelling in Joints and back pain.  Neurologic: Neurologic: no numbness or tingling and difficulty with balance.   Physical Exam Patient is a 77 year old female.  General Mental Status - Alert, cooperative and good historian. General Appearance - pleasant, Not in acute distress. Orientation - Oriented X3. Build & Nutrition - Well nourished and Well developed.  Head and Neck Head - normocephalic, atraumatic . Neck Global Assessment - supple, no bruit  auscultated on the right, no bruit auscultated on the left.  Eye Pupil - Bilateral - PERR Motion - Bilateral - EOMI.  Chest and Lung Exam Auscultation Breath sounds - clear at anterior chest wall and clear at posterior chest wall. Adventitious sounds - No Adventitious sounds.  Cardiovascular Auscultation Rhythm - Irregular rhythm (history of atrial fib). Heart Sounds - S1 WNL and S2 WNL. Murmurs & Other Heart Sounds - Auscultation of the heart reveals - No Murmurs.  Abdomen Palpation/Percussion Tenderness - Abdomen is non-tender to palpation. Abdomen is soft. Auscultation Auscultation of the abdomen reveals - Bowel sounds normal.  Genitourinary Note: Not done, not pertinent to present illness  Musculoskeletal Her LEFT knee shows no effusion. Range of motion 5-125. There is marked crepitus on range of motion with tenderness medial greater and lateral no instability noted. Gait pattern is antalgic on the LEFT.   Assessment / Plan 1. Osteoarthritis of left knee joint M17.12: Unilateral primary osteoarthritis, left knee  Patient Instructions Surgical Plans: Left Total Knee Replacement  Disposition: Home PCP: Dr. [name] Cards: Dr. [name] Topical TXA Anesthesia Issues: None Patient was instructed on what medications to stop prior to surgery. - Follow up visit in 2 weeks with Dr. Wynelle Link - Begin physical therapy following surgery, schedule first PT Eval on 09/02/2017 - Left TKA by Dr. Wynelle Link. - Pre-operative lab work as pre Pre-Surgical Testing - Prescriptions will be provided in hospital at time of discharge  Return to [place], MD for [place] at [facility] on 09/13/2017 at 04:30 PM  Encounter signed-off by Mickel Crow, PA-C

## 2017-08-19 NOTE — H&P (View-Only) (Signed)
[name], [name] [phone], F) DOB May 03, 1941   Chief Complaint Left Knee Pain H&P left total knee 08-29-2017  Patient's Care Team Primary Care Provider: [name] MD: [place], [place], [place] [address], Ph [phone], Fax [phone] NPI: [id number] Patient's Pharmacies [id number]): [place], [place] [address], Ph [phone], Fax [phone]   Vitals Ht: 5 ft 0.5 in  Wt: 140 lbs BMI: 26.9  BP: Not Performed - Not tolerated  Pulse: 68 bpm irregular    Allergies Reviewed Allergies CHLORHEXIDINE: - Redness, Swelling  CODEINE: Nausea    Medications Reviewed Medications Anti-Oxidant 08/19/17   entered  , PA-C Breo Ellipta 100 mcg-25 mcg/dose powder for inhalation 06/16/17   filled PRESCRIPTION SOLUTIONS collagen (bovine) 08/19/17   entered  , PA-C diclofenac 1 % topical gel APPLY 2 GRAM TO THE AFFECTED AREA(S) BY TOPICAL ROUTE 4 TIMES PER DAY 08/11/17   filled PRESCRIPTION SOLUTIONS letrozole 2.5 mg tablet TAKE 1 TABLET BY MOUTH EVERY DAY 08/11/17   filled PRESCRIPTION SOLUTIONS levocetirizine 5 mg tablet 07/30/17   filled PRESCRIPTION SOLUTIONS milk thistle 08/19/17   entered  , PA-C MSM 08/19/17   entered  , PA-C potassium 08/19/17   entered  , PA-C propranolol ER 80 mg capsule,24 hr,extended release 07/07/17   filled PRESCRIPTION SOLUTIONS raNITIdine 150 mg tablet 06/17/17   filled PRESCRIPTION SOLUTIONS red yeast rice 08/19/17   entered  , PA-C Restasis 0.05 % eye drops in a dropperette 03/22/17   filled PRESCRIPTION SOLUTIONS resveratrol 08/19/17   entered  , PA-C triamterene 08/19/17   entered  , PA-C triamterene 37.5 mg-hydrochlorothiazide 25 mg tablet 12/29/16   filled PRESCRIPTION SOLUTIONS turmeric 08/19/17   entered  , PA-C ubiquinol  (bulk) 08/19/17   entered  , PA-C venlafaxine ER 75 mg capsule,extended release 24 hr 07/04/17   filled PRESCRIPTION SOLUTIONS Vitamin C 08/19/17   entered  , PA-C Vitamin D3 08/19/17   entered  , PA-C Xarelto 20 mg tablet 06/05/17   filled PRESCRIPTION SOLUTIONS              Problems Reviewed Problems Anxiety - Onset: 06/15/2017 Varicose vein finding - Onset: 06/15/2017 Joint pain - Onset: 06/15/2017 Impingement syndrome of shoulder region - Onset: 07/13/2017, Left Easy bruising - Onset: 06/15/2017 Laceration of lower leg - Onset: 06/21/2017, Right Pain of left shoulder joint - Onset: 08/04/2017   Family History Reviewed Family History Father - Father deceased   - Myocardial infarction   - Chronic obstructive lung disease   - Alcoholism Mother - Mother deceased   - Atherosclerosis   Social History Reviewed Social History Smoking Status: Never smoker Alcohol intake: Occasional Hand Dominance: Right Work related injury?: N Exercise level: 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, government, Weights, Yoga) Advance directive: Y (Notes: Living Will) Medical Power of Attorney: Y   Surgical History Reviewed Surgical History Closure of skin by suture Reduction of dislocation of elbow - 2018 Knee Surgery - 2014 Mastectomy - 2018 Arthroscopy - 2014 Bunionectomy - 2003    Past Medical History Reviewed Past Medical History Anemia: Y Anxiety Disorder: Y Asthma: Y Cancer: Y GERD/Reflux: Y Heart Problems: Y Hypertension: Y Irregular Heartbeat: Y - Atrial Fib Joint Pain: Y Osteoarthritis: Y Previous Cortisone Injection(s): Y Previous Fracture(s): Y Previous Oral Steroid(s): Y Swelling of Legs/Feet/Hands: Y Notes: Shingles,  Cataracts,  History of Bronchitis,  History of Pneumonia,  Diverticulosis,  Breast Cancer - Right Side,  Basal Cell Skin Cancer,  Melanoma Skin Cancer,  History of  Anemia,  Peripheral Edema,   Postmeonpausal   HPI The patient is here today for a pre-operative History and Physical. They are scheduled for left total knee replacement on 08-29-2017 with Dr. Wynelle Link at [facility]. The patient is being followed for their left knee pain and osteoarthritis. Symptoms reported include: pain, pain after sitting, pain at night, locking, catching, pain with weightbearing and difficulty arising from chair. The patient feels that they are doing poorly. The patient has reported only temporary improvement of their symptoms with: conservative measures and Cortisone injections. The knee had been catching or locking just achy discomfort. She was previously been scheduled for LEFT total knee arthroplasty but had to have it cancelled due to onset of atrial fibrillation and also a diagnosis of breast cancer. She has completed treatment for the breast cancer and says the atrial fibrillation is under good control. The knee is hurting at all times and limiting what she can and cannot do. She is now ready to proceed with surgery. She has been rescheduled and ready for the left total knee replacement.   ROS Constitutional: Constitutional: no fever, chills, or significant weight loss and night sweats.  Cardiovascular: Cardiovascular: no palpitations or chest pain.  Respiratory: Respiratory: no cough or shortness of breath and No COPD.  Gastrointestinal: Gastrointestinal: no vomiting or nausea.  Musculoskeletal: Musculoskeletal: Joint Pain and swelling in Joints and back pain.  Neurologic: Neurologic: no numbness or tingling and difficulty with balance.   Physical Exam Patient is a 77 year old female.  General Mental Status - Alert, cooperative and good historian. General Appearance - pleasant, Not in acute distress. Orientation - Oriented X3. Build & Nutrition - Well nourished and Well developed.  Head and Neck Head - normocephalic, atraumatic . Neck Global Assessment - supple, no bruit  auscultated on the right, no bruit auscultated on the left.  Eye Pupil - Bilateral - PERR Motion - Bilateral - EOMI.  Chest and Lung Exam Auscultation Breath sounds - clear at anterior chest wall and clear at posterior chest wall. Adventitious sounds - No Adventitious sounds.  Cardiovascular Auscultation Rhythm - Irregular rhythm (history of atrial fib). Heart Sounds - S1 WNL and S2 WNL. Murmurs & Other Heart Sounds - Auscultation of the heart reveals - No Murmurs.  Abdomen Palpation/Percussion Tenderness - Abdomen is non-tender to palpation. Abdomen is soft. Auscultation Auscultation of the abdomen reveals - Bowel sounds normal.  Genitourinary Note: Not done, not pertinent to present illness  Musculoskeletal Her LEFT knee shows no effusion. Range of motion 5-125. There is marked crepitus on range of motion with tenderness medial greater and lateral no instability noted. Gait pattern is antalgic on the LEFT.   Assessment / Plan 1. Osteoarthritis of left knee joint M17.12: Unilateral primary osteoarthritis, left knee  Patient Instructions Surgical Plans: Left Total Knee Replacement  Disposition: Home PCP: Dr. [name] Cards: Dr. [name] Topical TXA Anesthesia Issues: None Patient was instructed on what medications to stop prior to surgery. - Follow up visit in 2 weeks with Dr. Wynelle Link - Begin physical therapy following surgery, schedule first PT Eval on 09/02/2017 - Left TKA by Dr. Wynelle Link. - Pre-operative lab work as pre Pre-Surgical Testing - Prescriptions will be provided in hospital at time of discharge  Return to [place], MD for [place] at [facility] on 09/13/2017 at 04:30 PM  Encounter signed-off by Mickel Crow, PA-C

## 2017-08-19 NOTE — Patient Instructions (Addendum)
DAJSHA MASSARO  08/19/2017  [name]  08/25/2017   Your procedure is scheduled on: 08-29-17   Report to [facility] Main  Entrance ARRIVE AT 530 AM. Have a seat in the Main Lobby. Please note there is a phone at the [employer]. Please call [phone] on that phone. Someone from Short Stay will come and get you from the Main Lobby and take you to Short Stay.    Call this number if you have problems the morning of surgery [phone]   Remember: Do not eat food or drink liquids :After Midnight.         Take these medicines the morning of surgery with A SIP OF WATER: Propranolol ER (Inderal), Venlafaxine XR (Effexor), Letrozole (Femara). You may also bring and use your eyedrops and inhaler as needed.                                 You may not have any metal on your body including hair pins and              piercings  Do not wear jewelry, make-up, lotions, powders or perfumes, deodorant             Do not wear nail polish.  Do not shave  48 hours prior to surgery.                Do not bring valuables to the hospital. [place].  Contacts, dentures or bridgework may not be worn into surgery.  Leave suitcase in the car. After surgery it may be brought to your room.                  Please read over the following fact sheets you were given: _____________________________________________________________________           [facility] - Preparing for Surgery Before surgery, you can play an important role.  Because skin is not sterile, your skin needs to be as free of germs as possible.  You can reduce the number of germs on your skin by washing with CHG (chlorahexidine gluconate) soap before surgery.  CHG is an antiseptic cleaner which kills germs and bonds with the skin to continue killing germs even after washing. Please DO NOT use if you have an allergy to CHG or antibacterial soaps.  If your  skin becomes reddened/irritated stop using the CHG and inform your nurse when you arrive at Short Stay. Do not shave (including legs and underarms) for at least 48 hours prior to the first CHG shower.  You may shave your face/neck. Please follow these instructions carefully:  1.  Shower with CHG Soap the night before surgery and the  morning of Surgery.  2.  If you choose to wash your hair, wash your hair first as usual with your  normal  shampoo.  3.  After you shampoo, rinse your hair and body thoroughly to remove the  shampoo.                           4.  Use CHG as you would any other liquid soap.  You can apply chg directly  to the skin  and wash                       Gently with a scrungie or clean washcloth.  5.  Apply the CHG Soap to your body ONLY FROM THE NECK DOWN.   Do not use on face/ open                           Wound or open sores. Avoid contact with eyes, ears mouth and genitals (private parts).                       Wash face,  Genitals (private parts) with your normal soap.             6.  Wash thoroughly, paying special attention to the area where your surgery  will be performed.  7.  Thoroughly rinse your body with warm water from the neck down.  8.  DO NOT shower/wash with your normal soap after using and rinsing off  the CHG Soap.                9.  Pat yourself dry with a clean towel.            10.  Wear clean pajamas.            11.  Place clean sheets on your bed the night of your first shower and do not  sleep with pets. Day of Surgery : Do not apply any lotions/deodorants the morning of surgery.  Please wear clean clothes to the hospital/surgery center.  FAILURE TO FOLLOW THESE INSTRUCTIONS MAY RESULT IN THE CANCELLATION OF YOUR SURGERY PATIENT SIGNATURE_________________________________  NURSE SIGNATURE__________________________________  ________________________________________________________________________   Adam Phenix  An incentive spirometer  is a tool that can help keep your lungs clear and active. This tool measures how well you are filling your lungs with each breath. Taking long deep breaths may help reverse or decrease the chance of developing breathing (pulmonary) problems (especially infection) following:  A long period of time when you are unable to move or be active. BEFORE THE PROCEDURE   If the spirometer includes an indicator to show your best effort, your nurse or respiratory therapist will set it to a desired goal.  If possible, sit up straight or lean slightly forward. Try not to slouch.  Hold the incentive spirometer in an upright position. INSTRUCTIONS FOR USE  1. Sit on the edge of your bed if possible, or sit up as far as you can in bed or on a chair. 2. Hold the incentive spirometer in an upright position. 3. Breathe out normally. 4. Place the mouthpiece in your mouth and seal your lips tightly around it. 5. Breathe in slowly and as deeply as possible, raising the piston or the ball toward the top of the column. 6. Hold your breath for 3-5 seconds or for as long as possible. Allow the piston or ball to fall to the bottom of the column. 7. Remove the mouthpiece from your mouth and breathe out normally. 8. Rest for a few seconds and repeat Steps 1 through 7 at least 10 times every 1-2 hours when you are awake. Take your time and take a few normal breaths between deep breaths. 9. The spirometer may include an indicator to show your best effort. Use the indicator as a goal to work toward during each repetition. 10. After each set of 10  deep breaths, practice coughing to be sure your lungs are clear. If you have an incision (the cut made at the time of surgery), support your incision when coughing by placing a pillow or rolled up towels firmly against it. Once you are able to get out of bed, walk around indoors and cough well. You may stop using the incentive spirometer when instructed by your caregiver.  RISKS AND  COMPLICATIONS  Take your time so you do not get dizzy or light-headed.  If you are in pain, you may need to take or ask for pain medication before doing incentive spirometry. It is harder to take a deep breath if you are having pain. AFTER USE  Rest and breathe slowly and easily.  It can be helpful to keep track of a log of your progress. Your caregiver can provide you with a simple table to help with this. If you are using the spirometer at home, follow these instructions: [place] IF:   You are having difficultly using the spirometer.  You have trouble using the spirometer as often as instructed.  Your pain medication is not giving enough relief while using the spirometer.  You develop fever of 100.5 F (38.1 C) or higher. SEEK IMMEDIATE MEDICAL CARE IF:   You cough up bloody sputum that had not been present before.  You develop fever of 102 F (38.9 C) or greater.  You develop worsening pain at or near the incision site. MAKE SURE YOU:   Understand these instructions.  Will watch your condition.  Will get help right away if you are not doing well or get worse. Document Released: 10/04/2006 Document Revised: 08/16/2011 Document Reviewed: 12/05/2006 Exit[employer] Patient Information 2014 Exit[employer], [place].   ________________________________________________________________________  WHAT IS A BLOOD TRANSFUSION? Blood Transfusion Information  A transfusion is the replacement of blood or some of its parts. Blood is made up of multiple cells which provide different functions.  Red blood cells carry oxygen and are used for blood loss replacement.  White blood cells fight against infection.  Platelets control bleeding.  Plasma helps clot blood.  Other blood products are available for specialized needs, such as hemophilia or other clotting disorders. BEFORE THE TRANSFUSION  Who gives blood for transfusions?   Healthy volunteers who are fully evaluated to make sure  their blood is safe. This is blood bank blood. Transfusion therapy is the safest it has ever been in the practice of medicine. Before blood is taken from a donor, a complete history is taken to make sure that person has no history of diseases nor engages in risky social behavior (examples are intravenous drug use or sexual activity with multiple partners). The donor's travel history is screened to minimize risk of transmitting infections, such as malaria. The donated blood is tested for signs of infectious diseases, such as HIV and hepatitis. The blood is then tested to be sure it is compatible with you in order to minimize the chance of a transfusion reaction. If you or a relative donates blood, this is often done in anticipation of surgery and is not appropriate for emergency situations. It takes many days to process the donated blood. RISKS AND COMPLICATIONS Although transfusion therapy is very safe and saves many lives, the main dangers of transfusion include:   Getting an infectious disease.  Developing a transfusion reaction. This is an allergic reaction to something in the blood you were given. Every precaution is taken to prevent this. The decision to have a blood transfusion  has been considered carefully by your caregiver before blood is given. Blood is not given unless the benefits outweigh the risks. AFTER THE TRANSFUSION  Right after receiving a blood transfusion, you will usually feel much better and more energetic. This is especially true if your red blood cells have gotten low (anemic). The transfusion raises the level of the red blood cells which carry oxygen, and this usually causes an energy increase.  The nurse administering the transfusion will monitor you carefully for complications. HOME CARE INSTRUCTIONS  No special instructions are needed after a transfusion. You may find your energy is better. Speak with your caregiver about any limitations on activity for underlying diseases  you may have. SEEK MEDICAL CARE IF:   Your condition is not improving after your transfusion.  You develop redness or irritation at the intravenous (IV) site. SEEK IMMEDIATE MEDICAL CARE IF:  Any of the following symptoms occur over the next 12 hours:  Shaking chills.  You have a temperature by mouth above 102 F (38.9 C), not controlled by medicine.  Chest, back, or muscle pain.  People around you feel you are not acting correctly or are confused.  Shortness of breath or difficulty breathing.  Dizziness and fainting.  You get a rash or develop hives.  You have a decrease in urine output.  Your urine turns a dark color or changes to pink, red, or brown. Any of the following symptoms occur over the next 10 days:  You have a temperature by mouth above 102 F (38.9 C), not controlled by medicine.  Shortness of breath.  Weakness after normal activity.  The white part of the eye turns yellow (jaundice).  You have a decrease in the amount of urine or are urinating less often.  Your urine turns a dark color or changes to pink, red, or brown. Document Released: 05/21/2000 Document Revised: 08/16/2011 Document Reviewed: 01/08/2008 [facility] Patient Information 2014 [place], [place].  _______________________________________________________________________

## 2017-08-19 NOTE — Progress Notes (Addendum)
05-27-18 Cardiac clearance from Dr. [name] on chart  01-29-17 (Epic) EKG,  CXR

## 2017-08-25 ENCOUNTER — Encounter (HOSPITAL_COMMUNITY)
Admission: RE | Admit: 2017-08-25 | Discharge: 2017-08-25 | Disposition: A | Discharge status: Home / Self Care | Payer: Medicare Other | Source: Ambulatory Visit | Attending: Orthopedic Surgery | Admitting: Orthopedic Surgery

## 2017-08-25 ENCOUNTER — Other Ambulatory Visit: Payer: Self-pay

## 2017-08-25 ENCOUNTER — Encounter (HOSPITAL_COMMUNITY): Payer: Self-pay

## 2017-08-25 DIAGNOSIS — Z01812 Encounter for preprocedural laboratory examination: Secondary | ICD-10-CM | POA: Insufficient documentation

## 2017-08-25 DIAGNOSIS — I481 Persistent atrial fibrillation: Secondary | ICD-10-CM | POA: Diagnosis not present

## 2017-08-25 LAB — COMPREHENSIVE METABOLIC PANEL
ALT: 11 U/L — ABNORMAL LOW (ref 14–54)
AST: 21 U/L (ref 15–41)
Albumin: 3.7 g/dL (ref 3.5–5.0)
Alkaline Phosphatase: 74 U/L (ref 38–126)
Anion gap: 10 (ref 5–15)
BILIRUBIN TOTAL: 0.5 mg/dL (ref 0.3–1.2)
BUN: 11 mg/dL (ref 6–20)
CALCIUM: 9.8 mg/dL (ref 8.9–10.3)
CO2: 29 mmol/L (ref 22–32)
Chloride: 98 mmol/L — ABNORMAL LOW (ref 101–111)
Creatinine, Ser: 0.67 mg/dL (ref 0.44–1.00)
GFR calc Af Amer: >60 mL/min (ref >60)
Glucose, Bld: 96 mg/dL (ref 65–99)
POTASSIUM: 4.5 mmol/L (ref 3.5–5.1)
Sodium: 137 mmol/L (ref 135–145)
TOTAL PROTEIN: 7.1 g/dL (ref 6.5–8.1)

## 2017-08-25 LAB — CBC
HEMATOCRIT: 39.8 % (ref 36.0–46.0)
Hemoglobin: 13 g/dL (ref 12.0–15.0)
MCH: 33.2 pg (ref 26.0–34.0)
MCHC: 32.7 g/dL (ref 30.0–36.0)
MCV: 101.8 fL — AB (ref 78.0–100.0)
Platelets: 320 10*3/uL (ref 150–400)
RBC: 3.91 MIL/uL (ref 3.87–5.11)
RDW: 12.7 % (ref 11.5–15.5)
WBC: 9.1 10*3/uL (ref 4.0–10.5)

## 2017-08-25 LAB — PROTIME-INR
INR: 2.12
PROTHROMBIN TIME: 23.5 s — AB (ref 11.4–15.2)

## 2017-08-25 LAB — APTT: aPTT: 42 seconds — ABNORMAL HIGH (ref 24–36)

## 2017-08-25 LAB — SURGICAL PCR SCREEN
MRSA, PCR: NEGATIVE
STAPHYLOCOCCUS AUREUS: POSITIVE — AB

## 2017-08-26 NOTE — Progress Notes (Signed)
08-26-17 PT, PTT, and PCR results routed to Dr. Wynelle Link for review.

## 2017-08-28 MED ORDER — BUPIVACAINE LIPOSOME 1.3 % IJ SUSP
20.0000 mL | Freq: Once | INTRAMUSCULAR | Status: DC
  Filled 2017-08-28: qty 20

## 2017-08-28 MED ORDER — TRANEXAMIC ACID 1000 MG/10ML IV SOLN
2000.0000 mg | Freq: Once | INTRAVENOUS | Status: DC
  Filled 2017-08-28: qty 20

## 2017-08-29 ENCOUNTER — Inpatient Hospital Stay (HOSPITAL_COMMUNITY)
Admission: RE | Admit: 2017-08-29 | Discharge: 2017-08-31 | DRG: 470 | Disposition: A | Discharge status: Home / Self Care | Payer: Medicare Other | Source: Ambulatory Visit | Attending: Orthopedic Surgery | Admitting: Orthopedic Surgery

## 2017-08-29 ENCOUNTER — Encounter (HOSPITAL_COMMUNITY)
Admission: RE | Disposition: A | Discharge status: Home / Self Care | Payer: Self-pay | Source: Ambulatory Visit | Attending: Orthopedic Surgery

## 2017-08-29 ENCOUNTER — Inpatient Hospital Stay (HOSPITAL_COMMUNITY): Payer: Medicare Other | Admitting: Certified Registered Nurse Anesthetist

## 2017-08-29 ENCOUNTER — Encounter (HOSPITAL_COMMUNITY): Payer: Self-pay | Admitting: *Deleted

## 2017-08-29 ENCOUNTER — Other Ambulatory Visit: Payer: Self-pay

## 2017-08-29 DIAGNOSIS — Z853 Personal history of malignant neoplasm of breast: Secondary | ICD-10-CM

## 2017-08-29 DIAGNOSIS — Z885 Allergy status to narcotic agent status: Secondary | ICD-10-CM

## 2017-08-29 DIAGNOSIS — Z888 Allergy status to other drugs, medicaments and biological substances status: Secondary | ICD-10-CM | POA: Diagnosis not present

## 2017-08-29 DIAGNOSIS — Z85820 Personal history of malignant melanoma of skin: Secondary | ICD-10-CM

## 2017-08-29 DIAGNOSIS — K219 Gastro-esophageal reflux disease without esophagitis: Secondary | ICD-10-CM | POA: Diagnosis not present

## 2017-08-29 DIAGNOSIS — Z7901 Long term (current) use of anticoagulants: Secondary | ICD-10-CM

## 2017-08-29 DIAGNOSIS — Z79899 Other long term (current) drug therapy: Secondary | ICD-10-CM | POA: Diagnosis not present

## 2017-08-29 DIAGNOSIS — Z9011 Acquired absence of right breast and nipple: Secondary | ICD-10-CM

## 2017-08-29 DIAGNOSIS — I119 Hypertensive heart disease without heart failure: Secondary | ICD-10-CM | POA: Diagnosis present

## 2017-08-29 DIAGNOSIS — Z803 Family history of malignant neoplasm of breast: Secondary | ICD-10-CM | POA: Diagnosis not present

## 2017-08-29 DIAGNOSIS — M25762 Osteophyte, left knee: Secondary | ICD-10-CM | POA: Diagnosis present

## 2017-08-29 DIAGNOSIS — I481 Persistent atrial fibrillation: Secondary | ICD-10-CM | POA: Diagnosis not present

## 2017-08-29 DIAGNOSIS — M1710 Unilateral primary osteoarthritis, unspecified knee: Secondary | ICD-10-CM | POA: Diagnosis present

## 2017-08-29 DIAGNOSIS — Z923 Personal history of irradiation: Secondary | ICD-10-CM | POA: Diagnosis not present

## 2017-08-29 DIAGNOSIS — Z825 Family history of asthma and other chronic lower respiratory diseases: Secondary | ICD-10-CM

## 2017-08-29 DIAGNOSIS — M1712 Unilateral primary osteoarthritis, left knee: Secondary | ICD-10-CM | POA: Diagnosis not present

## 2017-08-29 DIAGNOSIS — M25562 Pain in left knee: Secondary | ICD-10-CM | POA: Diagnosis present

## 2017-08-29 DIAGNOSIS — F419 Anxiety disorder, unspecified: Secondary | ICD-10-CM | POA: Diagnosis present

## 2017-08-29 DIAGNOSIS — M179 Osteoarthritis of knee, unspecified: Secondary | ICD-10-CM | POA: Diagnosis present

## 2017-08-29 DIAGNOSIS — Z8249 Family history of ischemic heart disease and other diseases of the circulatory system: Secondary | ICD-10-CM

## 2017-08-29 DIAGNOSIS — M7542 Impingement syndrome of left shoulder: Secondary | ICD-10-CM | POA: Diagnosis not present

## 2017-08-29 HISTORY — PX: TOTAL KNEE ARTHROPLASTY: SHX125

## 2017-08-29 LAB — TYPE AND SCREEN
ABO/RH(D): O POS
ANTIBODY SCREEN: NEGATIVE

## 2017-08-29 SURGERY — ARTHROPLASTY, KNEE, TOTAL
Anesthesia: Spinal | Site: Knee | Laterality: Left

## 2017-08-29 MED ORDER — PROPOFOL 10 MG/ML IV BOLUS
INTRAVENOUS | Status: DC | PRN
  Administered 2017-08-29 (×5): 10 mg via INTRAVENOUS

## 2017-08-29 MED ORDER — SODIUM CHLORIDE 0.9 % IR SOLN
Status: DC | PRN
  Administered 2017-08-29: 1000 mL

## 2017-08-29 MED ORDER — ACETAMINOPHEN 10 MG/ML IV SOLN
INTRAVENOUS | Status: AC
  Filled 2017-08-29: qty 100

## 2017-08-29 MED ORDER — POTASSIUM CHLORIDE CRYS ER 10 MEQ PO TBCR
10.0000 meq | EXTENDED_RELEASE_TABLET | Freq: Every day | ORAL | Status: DC
  Administered 2017-08-29 – 2017-08-31 (×3): 10 meq via ORAL
  Filled 2017-08-29 (×3): qty 1

## 2017-08-29 MED ORDER — BUPIVACAINE LIPOSOME 1.3 % IJ SUSP
INTRAMUSCULAR | Status: DC | PRN
  Administered 2017-08-29: 20 mL

## 2017-08-29 MED ORDER — SODIUM CHLORIDE 0.9 % IJ SOLN
INTRAMUSCULAR | Status: DC | PRN
  Administered 2017-08-29: 60 mL

## 2017-08-29 MED ORDER — GUAIFENESIN ER 600 MG PO TB12: 1200.0000 mg | ORAL_TABLET | Freq: Every day | ORAL | Status: DC | PRN

## 2017-08-29 MED ORDER — MENTHOL 3 MG MT LOZG: 1.0000 | LOZENGE | OROMUCOSAL | Status: DC | PRN

## 2017-08-29 MED ORDER — PROPOFOL 500 MG/50ML IV EMUL
INTRAVENOUS | Status: DC | PRN
  Administered 2017-08-29: 50 ug/kg/min via INTRAVENOUS

## 2017-08-29 MED ORDER — DEXAMETHASONE SODIUM PHOSPHATE 10 MG/ML IJ SOLN
INTRAMUSCULAR | Status: AC
  Filled 2017-08-29: qty 1

## 2017-08-29 MED ORDER — GABAPENTIN 300 MG PO CAPS
ORAL_CAPSULE | ORAL | Status: AC
  Administered 2017-08-29: 300 mg via ORAL
  Filled 2017-08-29: qty 1

## 2017-08-29 MED ORDER — METOCLOPRAMIDE HCL 5 MG/ML IJ SOLN: 5.0000 mg | Freq: Three times a day (TID) | INTRAMUSCULAR | Status: DC | PRN

## 2017-08-29 MED ORDER — CEFAZOLIN SODIUM-DEXTROSE 2-4 GM/100ML-% IV SOLN
2.0000 g | INTRAVENOUS | Status: AC
  Administered 2017-08-29: 2 g via INTRAVENOUS
  Filled 2017-08-29: qty 100

## 2017-08-29 MED ORDER — BUPIVACAINE IN DEXTROSE 0.75-8.25 % IT SOLN
INTRATHECAL | Status: DC | PRN
  Administered 2017-08-29: 1.6 mL via INTRATHECAL

## 2017-08-29 MED ORDER — CEFAZOLIN SODIUM-DEXTROSE 2-4 GM/100ML-% IV SOLN
INTRAVENOUS | Status: AC
  Filled 2017-08-29: qty 100

## 2017-08-29 MED ORDER — SODIUM CHLORIDE 0.9 % IJ SOLN
INTRAMUSCULAR | Status: AC
Start: 2017-08-29 — End: 2017-08-29
  Filled 2017-08-29: qty 10

## 2017-08-29 MED ORDER — VENLAFAXINE HCL ER 75 MG PO CP24
75.0000 mg | ORAL_CAPSULE | Freq: Every day | ORAL | Status: DC
  Administered 2017-08-30 – 2017-08-31 (×2): 75 mg via ORAL
  Filled 2017-08-29 (×2): qty 1

## 2017-08-29 MED ORDER — LEVOCETIRIZINE DIHYDROCHLORIDE 5 MG PO TABS: 5.0000 mg | ORAL_TABLET | Freq: Every day | ORAL | Status: DC

## 2017-08-29 MED ORDER — PHENYLEPHRINE 40 MCG/ML (10ML) SYRINGE FOR IV PUSH (FOR BLOOD PRESSURE SUPPORT)
PREFILLED_SYRINGE | INTRAVENOUS | Status: AC
Start: 2017-08-29 — End: 2017-08-29
  Filled 2017-08-29: qty 10

## 2017-08-29 MED ORDER — ROPIVACAINE HCL 7.5 MG/ML IJ SOLN
INTRAMUSCULAR | Status: DC | PRN
  Administered 2017-08-29: 20 mL via PERINEURAL

## 2017-08-29 MED ORDER — FLUTICASONE FUROATE-VILANTEROL 100-25 MCG/INH IN AEPB
1.0000 | INHALATION_SPRAY | Freq: Every day | RESPIRATORY_TRACT | Status: DC
  Filled 2017-08-29: qty 28

## 2017-08-29 MED ORDER — SODIUM CHLORIDE 0.9 % IV SOLN
INTRAVENOUS | Status: AC | PRN
  Administered 2017-08-29: 2000 mg via TOPICAL

## 2017-08-29 MED ORDER — HYDROMORPHONE HCL 1 MG/ML IJ SOLN
0.5000 mg | INTRAMUSCULAR | Status: DC | PRN
  Administered 2017-08-29 (×2): 0.5 mg via INTRAVENOUS
  Administered 2017-08-29: 1 mg via INTRAVENOUS
  Administered 2017-08-30 (×2): 0.5 mg via INTRAVENOUS
  Filled 2017-08-29 (×4): qty 1

## 2017-08-29 MED ORDER — FAMOTIDINE 20 MG PO TABS: 20.0000 mg | ORAL_TABLET | Freq: Every day | ORAL | Status: DC | PRN

## 2017-08-29 MED ORDER — ACETAMINOPHEN 10 MG/ML IV SOLN
1000.0000 mg | Freq: Once | INTRAVENOUS | Status: AC
  Administered 2017-08-29: 1000 mg via INTRAVENOUS
  Filled 2017-08-29: qty 100

## 2017-08-29 MED ORDER — DIPHENHYDRAMINE HCL 12.5 MG/5ML PO ELIX: 12.5000 mg | ORAL_SOLUTION | ORAL | Status: DC | PRN

## 2017-08-29 MED ORDER — OXYCODONE HCL 5 MG PO TABS
5.0000 mg | ORAL_TABLET | ORAL | Status: DC | PRN
  Administered 2017-08-29: 5 mg via ORAL
  Administered 2017-08-29: 10 mg via ORAL
  Administered 2017-08-29: 5 mg via ORAL
  Administered 2017-08-30: 10 mg via ORAL
  Filled 2017-08-29: qty 1
  Filled 2017-08-29: qty 2
  Filled 2017-08-29: qty 1
  Filled 2017-08-29: qty 2
  Filled 2017-08-29: qty 1
  Filled 2017-08-29: qty 2

## 2017-08-29 MED ORDER — LORATADINE 10 MG PO TABS
10.0000 mg | ORAL_TABLET | Freq: Every day | ORAL | Status: DC
  Administered 2017-08-29 – 2017-08-30 (×2): 10 mg via ORAL
  Filled 2017-08-29 (×2): qty 1

## 2017-08-29 MED ORDER — SODIUM CHLORIDE 0.9 % IV SOLN
INTRAVENOUS | Status: DC
  Administered 2017-08-29 – 2017-08-30 (×2): via INTRAVENOUS

## 2017-08-29 MED ORDER — ONDANSETRON HCL 4 MG/2ML IJ SOLN
INTRAMUSCULAR | Status: DC | PRN
  Administered 2017-08-29: 4 mg via INTRAVENOUS

## 2017-08-29 MED ORDER — PHENOL 1.4 % MT LIQD
1.0000 | OROMUCOSAL | Status: DC | PRN
  Filled 2017-08-29: qty 177

## 2017-08-29 MED ORDER — PHENYLEPHRINE 40 MCG/ML (10ML) SYRINGE FOR IV PUSH (FOR BLOOD PRESSURE SUPPORT)
PREFILLED_SYRINGE | INTRAVENOUS | Status: DC | PRN
  Administered 2017-08-29: 80 ug via INTRAVENOUS

## 2017-08-29 MED ORDER — POVIDONE-IODINE 7.5 % EX SOLN: Freq: Once | CUTANEOUS | Status: DC

## 2017-08-29 MED ORDER — HYDROMORPHONE HCL 1 MG/ML IJ SOLN: 0.2500 mg | INTRAMUSCULAR | Status: DC | PRN

## 2017-08-29 MED ORDER — PROPRANOLOL HCL ER 80 MG PO CP24
80.0000 mg | ORAL_CAPSULE | Freq: Every day | ORAL | Status: DC
  Administered 2017-08-30 – 2017-08-31 (×2): 80 mg via ORAL
  Filled 2017-08-29 (×2): qty 1

## 2017-08-29 MED ORDER — ONDANSETRON HCL 4 MG PO TABS: 4.0000 mg | ORAL_TABLET | Freq: Four times a day (QID) | ORAL | Status: DC | PRN

## 2017-08-29 MED ORDER — DEXAMETHASONE SODIUM PHOSPHATE 10 MG/ML IJ SOLN
10.0000 mg | Freq: Once | INTRAMUSCULAR | Status: AC
  Administered 2017-08-30: 10 mg via INTRAVENOUS
  Filled 2017-08-29: qty 1

## 2017-08-29 MED ORDER — DOCUSATE SODIUM 100 MG PO CAPS
100.0000 mg | ORAL_CAPSULE | Freq: Two times a day (BID) | ORAL | Status: DC
  Administered 2017-08-29 – 2017-08-31 (×4): 100 mg via ORAL
  Filled 2017-08-29 (×4): qty 1

## 2017-08-29 MED ORDER — ONDANSETRON HCL 4 MG/2ML IJ SOLN
INTRAMUSCULAR | Status: AC
  Filled 2017-08-29: qty 2

## 2017-08-29 MED ORDER — BISACODYL 10 MG RE SUPP: 10.0000 mg | Freq: Every day | RECTAL | Status: DC | PRN

## 2017-08-29 MED ORDER — ONDANSETRON HCL 4 MG/2ML IJ SOLN: 4.0000 mg | Freq: Four times a day (QID) | INTRAMUSCULAR | Status: DC | PRN

## 2017-08-29 MED ORDER — CYCLOSPORINE 0.05 % OP EMUL
1.0000 [drp] | Freq: Every day | OPHTHALMIC | Status: DC
  Administered 2017-08-30 – 2017-08-31 (×2): 1 [drp] via OPHTHALMIC
  Filled 2017-08-29 (×4): qty 1

## 2017-08-29 MED ORDER — POLYETHYLENE GLYCOL 3350 17 G PO PACK: 17.0000 g | PACK | Freq: Every day | ORAL | Status: DC | PRN

## 2017-08-29 MED ORDER — PROMETHAZINE HCL 25 MG/ML IJ SOLN: 6.2500 mg | INTRAMUSCULAR | Status: DC | PRN

## 2017-08-29 MED ORDER — CEFAZOLIN SODIUM-DEXTROSE 1-4 GM/50ML-% IV SOLN
1.0000 g | Freq: Four times a day (QID) | INTRAVENOUS | Status: AC
  Administered 2017-08-29 (×2): 1 g via INTRAVENOUS
  Filled 2017-08-29 (×2): qty 50

## 2017-08-29 MED ORDER — FENTANYL CITRATE (PF) 100 MCG/2ML IJ SOLN
INTRAMUSCULAR | Status: DC | PRN
  Administered 2017-08-29: 50 ug via INTRAVENOUS

## 2017-08-29 MED ORDER — FENTANYL CITRATE (PF) 100 MCG/2ML IJ SOLN
INTRAMUSCULAR | Status: AC
  Filled 2017-08-29: qty 2

## 2017-08-29 MED ORDER — TRIAMTERENE-HCTZ 37.5-25 MG PO TABS
1.0000 | ORAL_TABLET | Freq: Every morning | ORAL | Status: DC
  Administered 2017-08-30 – 2017-08-31 (×2): 1 via ORAL
  Filled 2017-08-29 (×2): qty 1

## 2017-08-29 MED ORDER — MIDAZOLAM HCL 2 MG/2ML IJ SOLN
INTRAMUSCULAR | Status: AC
  Filled 2017-08-29: qty 2

## 2017-08-29 MED ORDER — MIDAZOLAM HCL 5 MG/5ML IJ SOLN
INTRAMUSCULAR | Status: DC | PRN
  Administered 2017-08-29 (×2): 0.5 mg via INTRAVENOUS

## 2017-08-29 MED ORDER — LETROZOLE 2.5 MG PO TABS
2.5000 mg | ORAL_TABLET | Freq: Every day | ORAL | Status: DC
  Administered 2017-08-30 – 2017-08-31 (×2): 2.5 mg via ORAL
  Filled 2017-08-29 (×2): qty 1

## 2017-08-29 MED ORDER — FLEET ENEMA 7-19 GM/118ML RE ENEM: 1.0000 | ENEMA | Freq: Once | RECTAL | Status: DC | PRN

## 2017-08-29 MED ORDER — RIVAROXABAN 10 MG PO TABS
10.0000 mg | ORAL_TABLET | Freq: Every day | ORAL | Status: DC
  Administered 2017-08-30 – 2017-08-31 (×2): 10 mg via ORAL
  Filled 2017-08-29 (×2): qty 1

## 2017-08-29 MED ORDER — METHOCARBAMOL 500 MG PO TABS
500.0000 mg | ORAL_TABLET | Freq: Four times a day (QID) | ORAL | Status: DC | PRN
  Administered 2017-08-29 – 2017-08-31 (×4): 500 mg via ORAL
  Filled 2017-08-29 (×4): qty 1

## 2017-08-29 MED ORDER — ALBUTEROL SULFATE (2.5 MG/3ML) 0.083% IN NEBU: 3.0000 mL | INHALATION_SOLUTION | Freq: Four times a day (QID) | RESPIRATORY_TRACT | Status: DC | PRN

## 2017-08-29 MED ORDER — OXYCODONE HCL 5 MG PO TABS
10.0000 mg | ORAL_TABLET | ORAL | Status: DC | PRN
  Administered 2017-08-29: 15 mg via ORAL
  Administered 2017-08-30: 10 mg via ORAL
  Filled 2017-08-29: qty 2

## 2017-08-29 MED ORDER — PHENYLEPHRINE HCL 10 MG/ML IJ SOLN
INTRAVENOUS | Status: DC | PRN
  Administered 2017-08-29: 40 ug/min via INTRAVENOUS

## 2017-08-29 MED ORDER — PHENYLEPHRINE HCL 10 MG/ML IJ SOLN
INTRAMUSCULAR | Status: AC
Start: 2017-08-29 — End: 2017-08-29
  Filled 2017-08-29: qty 1

## 2017-08-29 MED ORDER — METHOCARBAMOL 1000 MG/10ML IJ SOLN
500.0000 mg | Freq: Four times a day (QID) | INTRAVENOUS | Status: DC | PRN
  Administered 2017-08-29: 500 mg via INTRAVENOUS
  Filled 2017-08-29: qty 550

## 2017-08-29 MED ORDER — POTASSIUM 99 MG PO TABS: 99.0000 mg | ORAL_TABLET | Freq: Every day | ORAL | Status: DC

## 2017-08-29 MED ORDER — SODIUM CHLORIDE 0.9 % IJ SOLN
INTRAMUSCULAR | Status: AC
  Filled 2017-08-29: qty 50

## 2017-08-29 MED ORDER — METOCLOPRAMIDE HCL 5 MG PO TABS: 5.0000 mg | ORAL_TABLET | Freq: Three times a day (TID) | ORAL | Status: DC | PRN

## 2017-08-29 MED ORDER — GABAPENTIN 300 MG PO CAPS
300.0000 mg | ORAL_CAPSULE | Freq: Once | ORAL | Status: AC
  Administered 2017-08-29: 300 mg via ORAL
  Filled 2017-08-29: qty 1

## 2017-08-29 MED ORDER — DEXAMETHASONE SODIUM PHOSPHATE 10 MG/ML IJ SOLN
10.0000 mg | Freq: Once | INTRAMUSCULAR | Status: AC
  Administered 2017-08-29: 10 mg via INTRAVENOUS

## 2017-08-29 MED ORDER — ALPRAZOLAM 0.5 MG PO TABS: 0.5000 mg | ORAL_TABLET | Freq: Every day | ORAL | Status: DC | PRN

## 2017-08-29 MED ORDER — LACTATED RINGERS IV SOLN
INTRAVENOUS | Status: DC
  Administered 2017-08-29 (×2): via INTRAVENOUS

## 2017-08-29 MED ORDER — PROPOFOL 10 MG/ML IV BOLUS
INTRAVENOUS | Status: AC
  Filled 2017-08-29: qty 60

## 2017-08-29 SURGICAL SUPPLY — 52 items
BAG DECANTER FOR FLEXI CONT (MISCELLANEOUS) ×2 IMPLANT
BAG SPEC THK2 15X12 ZIP CLS (MISCELLANEOUS) ×1
BAG ZIPLOCK 12X15 (MISCELLANEOUS) ×2 IMPLANT
BANDAGE ACE 6X5 VEL STRL LF (GAUZE/BANDAGES/DRESSINGS) ×2 IMPLANT
BLADE SAG 18X100X1.27 (BLADE) ×2 IMPLANT
BLADE SAW SGTL 11.0X1.19X90.0M (BLADE) ×2 IMPLANT
BOWL SMART MIX CTS (DISPOSABLE) ×2 IMPLANT
CAP KNEE TOTAL 3 SIGMA ×1 IMPLANT
CEMENT HV SMART SET (Cement) ×4 IMPLANT
COVER SURGICAL LIGHT HANDLE (MISCELLANEOUS) ×2 IMPLANT
CUFF TOURN SGL QUICK 34 (TOURNIQUET CUFF) ×2
CUFF TRNQT CYL 34X4X40X1 (TOURNIQUET CUFF) ×1 IMPLANT
DECANTER SPIKE VIAL GLASS SM (MISCELLANEOUS) ×2 IMPLANT
DRAPE TOP 10253 STERILE (DRAPES) IMPLANT
DRAPE U-SHAPE 47X51 STRL (DRAPES) ×2 IMPLANT
DRSG ADAPTIC 3X8 NADH LF (GAUZE/BANDAGES/DRESSINGS) ×2 IMPLANT
DRSG PAD ABDOMINAL 8X10 ST (GAUZE/BANDAGES/DRESSINGS) ×2 IMPLANT
DURAPREP 26ML APPLICATOR (WOUND CARE) ×2 IMPLANT
ELECT REM PT RETURN 15FT ADLT (MISCELLANEOUS) ×2 IMPLANT
EVACUATOR 1/8 PVC DRAIN (DRAIN) ×2 IMPLANT
GAUZE SPONGE 4X4 12PLY STRL (GAUZE/BANDAGES/DRESSINGS) ×2 IMPLANT
GLOVE BIO SURGEON STRL SZ7.5 (GLOVE) IMPLANT
GLOVE BIO SURGEON STRL SZ8 (GLOVE) ×2 IMPLANT
GLOVE BIOGEL PI IND STRL 6.5 (GLOVE) IMPLANT
GLOVE BIOGEL PI IND STRL 7.0 (GLOVE) IMPLANT
GLOVE BIOGEL PI IND STRL 8 (GLOVE) ×1 IMPLANT
GLOVE BIOGEL PI INDICATOR 6.5 (GLOVE) ×1
GLOVE BIOGEL PI INDICATOR 7.0 (GLOVE) ×4
GLOVE BIOGEL PI INDICATOR 8 (GLOVE) ×1
GLOVE SURG SS PI 6.5 STRL IVOR (GLOVE) ×1 IMPLANT
GOWN STRL REUS W/TWL LRG LVL3 (GOWN DISPOSABLE) ×3 IMPLANT
GOWN STRL REUS W/TWL XL LVL3 (GOWN DISPOSABLE) ×2 IMPLANT
HANDPIECE INTERPULSE COAX TIP (DISPOSABLE) ×2
IMMOBILIZER KNEE 20 (SOFTGOODS) ×2
IMMOBILIZER KNEE 20 THIGH 36 (SOFTGOODS) ×1 IMPLANT
MANIFOLD NEPTUNE II (INSTRUMENTS) ×2 IMPLANT
NS IRRIG 1000ML POUR BTL (IV SOLUTION) ×2 IMPLANT
PACK TOTAL KNEE CUSTOM (KITS) ×2 IMPLANT
PADDING CAST COTTON 6X4 STRL (CAST SUPPLIES) ×4 IMPLANT
POSITIONER SURGICAL ARM (MISCELLANEOUS) ×2 IMPLANT
SET HNDPC FAN SPRY TIP SCT (DISPOSABLE) ×1 IMPLANT
STRIP CLOSURE SKIN 1/2X4 (GAUZE/BANDAGES/DRESSINGS) ×3 IMPLANT
SUT MNCRL AB 4-0 PS2 18 (SUTURE) ×2 IMPLANT
SUT STRATAFIX 0 PDS 27 VIOLET (SUTURE) ×2
SUT VIC AB 2-0 CT1 27 (SUTURE) ×6
SUT VIC AB 2-0 CT1 TAPERPNT 27 (SUTURE) ×3 IMPLANT
SUTURE STRATFX 0 PDS 27 VIOLET (SUTURE) ×1 IMPLANT
SYR 30ML LL (SYRINGE) ×4 IMPLANT
TRAY FOLEY CATH 14FR (SET/KITS/TRAYS/PACK) ×1 IMPLANT
WATER STERILE IRR 1000ML POUR (IV SOLUTION) ×4 IMPLANT
WRAP KNEE MAXI GEL POST OP (GAUZE/BANDAGES/DRESSINGS) ×2 IMPLANT
YANKAUER SUCT BULB TIP 10FT TU (MISCELLANEOUS) ×2 IMPLANT

## 2017-08-29 NOTE — Evaluation (Signed)
Physical Therapy Evaluation Patient Details Name: [name] MRN: [id number] DOB: 1940-06-09 Today's Date: 08/29/2017   History of Present Illness  L TKA, h/o R TKA  Clinical Impression  The patient reports pain in the left knee, ambulated x 20' Plans Dc home. Pt admitted with above diagnosis. Pt currently with functional limitations due to the deficits listed below (see PT Problem List).  Pt will benefit from skilled PT to increase their independence and safety with mobility to allow discharge to the venue listed below.       Follow Up Recommendations Follow surgeon's recommendation for DC plan and follow-up therapies    Equipment Recommendations  None recommended by PT    Recommendations for Other Services   OT    Precautions / Restrictions Precautions Precautions: Knee Required Braces or Orthoses: Knee Immobilizer - Left Knee Immobilizer - Left: Discontinue once straight leg raise with < 10 degree lag      Mobility  Bed Mobility Overal bed mobility: Needs Assistance Bed Mobility: Supine to Sit;Sit to Supine     Supine to sit: Min assist Sit to supine: Min assist   General bed mobility comments: assist with left leg,  Transfers Overall transfer level: Needs assistance Equipment used: Rolling walker (2 wheeled) Transfers: Sit to/from Stand Sit to Stand: Min assist         General transfer comment: cues for hand and left leg position  Ambulation/Gait Ambulation/Gait assistance: Min assist;+2 safety/equipment Ambulation Distance (Feet): 20 Feet Assistive device: Rolling walker (2 wheeled) Gait Pattern/deviations: Step-to pattern;Antalgic;Decreased step length - right;Decreased stance time - right     General Gait Details: cues for squence and for posture.  Stairs            Wheelchair Mobility    Modified Rankin (Stroke Patients Only)       Balance                                             Pertinent Vitals/Pain Pain  Assessment: 0-10 Pain Score: 7  Pain Location: left knee Pain Descriptors / Indicators: Cramping;Discomfort Pain Intervention(s): Monitored during session;RN gave pain meds during session;Ice applied;Limited activity within patient's tolerance    Home Living Family/patient expects to be discharged to:: Private residence Living Arrangements: Spouse/significant other Available Help at Discharge: Family Type of Home: House Home Access: Stairs to enter Entrance Stairs-Rails: None Entrance Stairs-Number of Steps: 2 Home Layout: One level Home Equipment: Environmental consultant - 2 wheels;Shower seat - built in      Prior Function Level of Independence: Independent               Journalist, newspaper        Extremity/Trunk Assessment   Upper Extremity Assessment Upper Extremity Assessment: Defer to OT evaluation    Lower Extremity Assessment Lower Extremity Assessment: LLE deficits/detail LLE Deficits / Details: + SLR woth KI in place       Communication   Communication: No difficulties  Cognition Arousal/Alertness: Awake/alert Behavior During Therapy: WFL for tasks assessed/performed Overall Cognitive Status: Within Functional Limits for tasks assessed                                        General Comments      Exercises     Assessment/Plan  PT Assessment Patient needs continued PT services  PT Problem List Decreased strength;Decreased range of motion;Decreased activity tolerance;Decreased knowledge of use of DME;Decreased safety awareness;Decreased mobility;Decreased knowledge of precautions;Pain       PT Treatment Interventions DME instruction;Therapeutic exercise;Gait training;Stair training;Functional mobility training;Therapeutic activities;Patient/family education    PT Goals (Current goals can be found in the Care Plan section)  Acute Rehab PT Goals Patient Stated Goal: go home,  PT Goal Formulation: With patient/family Time For Goal Achievement:  09/02/17 Potential to Achieve Goals: Good    Frequency 7X/week   Barriers to discharge        Co-evaluation               AM-PAC PT "6 Clicks" Daily Activity  Outcome Measure Difficulty turning over in bed (including adjusting bedclothes, sheets and blankets)?: A Lot Difficulty moving from lying on back to sitting on the side of the bed? : A Lot Difficulty sitting down on and standing up from a chair with arms (e.g., wheelchair, bedside commode, etc,.)?: A Lot Help needed moving to and from a bed to chair (including a wheelchair)?: Total Help needed walking in hospital room?: Total Help needed climbing 3-5 steps with a railing? : Total 6 Click Score: 9    End of Session Equipment Utilized During Treatment: Left knee immobilizer Activity Tolerance: Patient limited by pain Patient left: in bed;with call bell/phone within reach;with bed alarm set;with family/visitor present Nurse Communication: Mobility status PT Visit Diagnosis: Unsteadiness on feet (R26.81);Pain Pain - Right/Left: Left Pain - part of body: Knee    Time: 4034-7425 PT Time Calculation (min) (ACUTE ONLY): 25 min   Charges:   PT Evaluation $PT Eval Low Complexity: 1 Low PT Treatments $Gait Training: 8-22 mins   PT G CodesTresa Endo PT [phone]   Claretha Cooper 08/29/2017, 6:07 PM

## 2017-08-29 NOTE — Op Note (Signed)
OPERATIVE REPORT-TOTAL KNEE ARTHROPLASTY   Pre-operative diagnosis- Osteoarthritis  Left knee(s)  Post-operative diagnosis- Osteoarthritis Left knee(s)  Procedure-  Left  Total Knee Arthroplasty  Surgeon- Dione Plover. Eithel Ryall, MD  Assistant- Ardeen Jourdain, PA-C   Anesthesia-  Spinal  EBL-50 mL   Drains Hemovac  Tourniquet time-  Total Tourniquet Time Documented: Thigh (Left) - 32 minutes Total: Thigh (Left) - 32 minutes     Complications- None  Condition-PACU - hemodynamically stable.   Brief Clinical Note  BONNY VANLEEUWEN is a 77 y.o. year old female with end stage OA of her left knee with progressively worsening pain and dysfunction. She has constant pain, with activity and at rest and significant functional deficits with difficulties even with ADLs. She has had extensive non-op management including analgesics, injections of cortisone and viscosupplements, and home exercise program, but remains in significant pain with significant dysfunction. Radiographs show bone on bone arthritis lateral and patellofemoral. She presents now for left Total Knee Arthroplasty.    Procedure in detail---   The patient is brought into the operating room and positioned supine on the operating table. After successful administration of  Spinal,   a tourniquet is placed high on the  Left thigh(s) and the lower extremity is prepped and draped in the usual sterile fashion. Time out is performed by the operating team and then the  Left lower extremity is wrapped in Esmarch, knee flexed and the tourniquet inflated to 300 mmHg.       A midline incision is made with a ten blade through the subcutaneous tissue to the level of the extensor mechanism. A fresh blade is used to make a medial parapatellar arthrotomy. Soft tissue over the proximal medial tibia is subperiosteally elevated to the joint line with a knife and into the semimembranosus bursa with a Cobb elevator. Soft tissue over the proximal lateral  tibia is elevated with attention being paid to avoiding the patellar tendon on the tibial tubercle. The patella is everted, knee flexed 90 degrees and the ACL and PCL are removed. Findings are bone on bone lateral and patellofemoral with large global osteophytes        The drill is used to create a starting hole in the distal femur and the canal is thoroughly irrigated with sterile saline to remove the fatty contents. The 5 degree Left  valgus alignment guide is placed into the femoral canal and the distal femoral cutting block is pinned to remove 10 mm off the distal femur. Resection is made with an oscillating saw.      The tibia is subluxed forward and the menisci are removed. The extramedullary alignment guide is placed referencing proximally at the medial aspect of the tibial tubercle and distally along the second metatarsal axis and tibial crest. The block is pinned to remove 69mm off the more deficient lateral  side. Resection is made with an oscillating saw. Size 2.5is the most appropriate size for the tibia and the proximal tibia is prepared with the modular drill and keel punch for that size.      The femoral sizing guide is placed and size 3 is most appropriate. Rotation is marked off the epicondylar axis and confirmed by creating a rectangular flexion gap at 90 degrees. The size 3 cutting block is pinned in this rotation and the anterior, posterior and chamfer cuts are made with the oscillating saw. The intercondylar block is then placed and that cut is made.      Trial size 2.5 tibial  component, trial size 3 posterior stabilized femur and a 12.5  mm posterior stabilized rotating platform insert trial is placed. Full extension is achieved with excellent varus/valgus and anterior/posterior balance throughout full range of motion. The patella is everted and thickness measured to be 22  mm. Free hand resection is taken to 12 mm, a 35 template is placed, lug holes are drilled, trial patella is placed, and  it tracks normally. Osteophytes are removed off the posterior femur with the trial in place. All trials are removed and the cut bone surfaces prepared with pulsatile lavage. Cement is mixed and once ready for implantation, the size 2.5 tibial implant, size  3 posterior stabilized femoral component, and the size 35 patella are cemented in place and the patella is held with the clamp. The trial insert is placed and the knee held in full extension. The Exparel (20 ml mixed with 60 ml saline) is injected into the extensor mechanism, posterior capsule, medial and lateral gutters and subcutaneous tissues.  All extruded cement is removed and once the cement is hard the permanent 12.5 mm posterior stabilized rotating platform insert is placed into the tibial tray.      The wound is copiously irrigated with saline solution and the extensor mechanism closed over a hemovac drain with #1 V-loc suture. The tourniquet is released for a total tourniquet time of 32  minutes. Flexion against gravity is 140 degrees and the patella tracks normally. Subcutaneous tissue is closed with 2.0 vicryl and subcuticular with running 4.0 Monocryl. The incision is cleaned and dried and steri-strips and a bulky sterile dressing are applied. The limb is placed into a knee immobilizer and the patient is awakened and transported to recovery in stable condition.      Please note that a surgical assistant was a medical necessity for this procedure in order to perform it in a safe and expeditious manner. Surgical assistant was necessary to retract the ligaments and vital neurovascular structures to prevent injury to them and also necessary for proper positioning of the limb to allow for anatomic placement of the prosthesis.   Dione Plover Emberlee Sortino, MD    08/29/2017, 8:20 AM

## 2017-08-29 NOTE — Anesthesia Postprocedure Evaluation (Signed)
Anesthesia Post Note  Patient: [name]  Procedure(s) Performed: LEFT TOTAL KNEE ARTHROPLASTY (Left Knee)     Patient location during evaluation: PACU Anesthesia Type: Spinal Level of consciousness: oriented and awake and alert Pain management: pain level controlled Vital Signs Assessment: post-procedure vital signs reviewed and stable Respiratory status: spontaneous breathing, respiratory function stable and patient connected to nasal cannula oxygen Cardiovascular status: blood pressure returned to baseline and stable Postop Assessment: no headache, no backache and no apparent nausea or vomiting Anesthetic complications: no    Last Vitals:  Vitals:   08/29/17 0900 08/29/17 0915  BP: 90/71 98/65  Pulse: 74 70  Resp: (!) 22 12  Temp:    SpO2: 98% 99%    Last Pain:  Vitals:   08/29/17 0930  TempSrc:   PainSc: 0-No pain            L Sensory Level: L2-Upper inner thigh, upper buttock (08/29/17 0930) R Sensory Level: L2-Upper inner thigh, upper buttock (08/29/17 0930)  Kymir Coles S

## 2017-08-29 NOTE — Anesthesia Procedure Notes (Signed)
Anesthesia Regional Block: Adductor canal block   Pre-Anesthetic Checklist: ,, timeout performed, Correct Patient, Correct Site, Correct Laterality, Correct Procedure, Correct Position, site marked, Risks and benefits discussed,  Surgical consent,  Pre-op evaluation,  At surgeon's request and post-op pain management  Laterality: Left  Prep: chloraprep       Needles:  Injection technique: Single-shot  Needle Type: Echogenic Needle     Needle Length: 9cm      Additional Needles:   Procedures:,,,, ultrasound used (permanent image in chart),,,,  Narrative:  Start time: 08/29/2017 8:50 AM End time: 08/29/2017 8:57 AM Injection made incrementally with aspirations every 5 mL.  Performed by: Personally  Anesthesiologist: Myrtie Soman, MD  Additional Notes: Patient tolerated the procedure well without complications

## 2017-08-29 NOTE — Anesthesia Preprocedure Evaluation (Signed)
Anesthesia Evaluation  Patient identified by MRN, date of birth, ID band Patient awake    Reviewed: Allergy & Precautions, NPO status , Patient's Chart, lab work & pertinent test results  Airway Mallampati: II  TM Distance: >3 FB Neck ROM: Full    Dental no notable dental hx.    Pulmonary neg pulmonary ROS,    Pulmonary exam normal breath sounds clear to auscultation       Cardiovascular hypertension, Normal cardiovascular exam+ dysrhythmias Atrial Fibrillation  Rhythm:Regular Rate:Normal     Neuro/Psych negative neurological ROS  negative psych ROS   GI/Hepatic negative GI ROS, Neg liver ROS,   Endo/Other  negative endocrine ROS  Renal/GU negative Renal ROS  negative genitourinary   Musculoskeletal negative musculoskeletal ROS (+)   Abdominal   Peds negative pediatric ROS (+)  Hematology negative hematology ROS (+)   Anesthesia Other Findings   Reproductive/Obstetrics negative OB ROS                             Anesthesia Physical Anesthesia Plan  ASA: III  Anesthesia Plan: Spinal   Post-op Pain Management:    Induction: Intravenous  PONV Risk Score and Plan: 1 and Ondansetron and Dexamethasone  Airway Management Planned: Simple Face Mask  Additional Equipment:   Intra-op Plan:   Post-operative Plan:   Informed Consent: I have reviewed the patients History and Physical, chart, labs and discussed the procedure including the risks, benefits and alternatives for the proposed anesthesia with the patient or authorized representative who has indicated his/her understanding and acceptance.   Dental advisory given  Plan Discussed with: CRNA and Surgeon  Anesthesia Plan Comments:         Anesthesia Quick Evaluation

## 2017-08-29 NOTE — Transfer of Care (Signed)
Immediate Anesthesia Transfer of Care Note  Patient: [name]  Procedure(s) Performed: LEFT TOTAL KNEE ARTHROPLASTY (Left Knee)  Patient Location: PACU  Anesthesia Type:Spinal  Level of Consciousness: awake and patient cooperative  Airway & Oxygen Therapy: Patient Spontanous Breathing and Patient connected to face mask oxygen  Post-op Assessment: Report given to RN and Post -op Vital signs reviewed and stable  Post vital signs: Reviewed and stable  Last Vitals:  Vitals Value Taken Time  BP 104/56 08/29/2017  8:48 AM  Temp    Pulse    Resp 19 08/29/2017  8:50 AM  SpO2    Vitals shown include unvalidated device data.  Last Pain:  Vitals:   08/29/17 0615  TempSrc: Oral  PainSc:       Patients Stated Pain Goal: 5 (74/14/23 9532)  Complications: No apparent anesthesia complications

## 2017-08-29 NOTE — Anesthesia Procedure Notes (Signed)
Spinal  Patient location during procedure: OR Start time: 08/29/2017 7:25 AM End time: 08/29/2017 7:31 AM Staffing Anesthesiologist: Myrtie Soman, MD Performed: anesthesiologist  Preanesthetic Checklist Completed: patient identified, site marked, surgical consent, pre-op evaluation, timeout performed, IV checked, risks and benefits discussed and monitors and equipment checked Spinal Block Patient position: sitting Prep: Betadine Patient monitoring: heart rate, continuous pulse ox and blood pressure Location: L4-5 Injection technique: single-shot Needle Needle type: Sprotte  Needle gauge: 24 G Needle length: 9 cm Additional Notes Expiration date of kit checked and confirmed. Patient tolerated procedure well, without complications.

## 2017-08-29 NOTE — Interval H&P Note (Signed)
History and Physical Interval Note:  08/29/2017 6:28 AM  [name]  has presented today for surgery, with the diagnosis of Osteoarthritis Left Knee  The various methods of treatment have been discussed with the patient and family. After consideration of risks, benefits and other options for treatment, the patient has consented to  Procedure(s): LEFT TOTAL KNEE ARTHROPLASTY (Left) as a surgical intervention .  The patient's history has been reviewed, patient examined, no change in status, stable for surgery.  I have reviewed the patient's chart and labs.  Questions were answered to the patient's satisfaction.     Pilar Plate Jamine Wingate

## 2017-08-29 NOTE — Anesthesia Procedure Notes (Signed)
Anesthesia Procedure Image    

## 2017-08-29 NOTE — Discharge Instructions (Addendum)
° °Dr. Frank Aluisio °Total Joint Specialist °[facility] °[address] °[place], [place] [address] °[phone] ° °TOTAL KNEE REPLACEMENT POSTOPERATIVE DIRECTIONS ° °Knee Rehabilitation, Guidelines Following Surgery  °Results after knee surgery are often greatly improved when you follow the exercise, range of motion and muscle strengthening exercises prescribed by your doctor. Safety measures are also important to protect the knee from further injury. Any time any of these exercises cause you to have increased pain or swelling in your knee joint, decrease the amount until you are comfortable again and slowly increase them. If you have problems or questions, call your caregiver or physical therapist for advice.  ° °HOME CARE INSTRUCTIONS  °Remove items at home which could result in a fall. This includes throw rugs or furniture in walking pathways.  °· ICE to the affected knee every three hours for 30 minutes at a time and then as needed for pain and swelling.  Continue to use ice on the knee for pain and swelling from surgery. You may notice swelling that will progress down to the foot and ankle.  This is normal after surgery.  Elevate the leg when you are not up walking on it.   °· Continue to use the breathing machine which will help keep your temperature down.  It is common for your temperature to cycle up and down following surgery, especially at night when you are not up moving around and exerting yourself.  The breathing machine keeps your lungs expanded and your temperature down. °· Do not place pillow under knee, focus on keeping the knee straight while resting ° °DIET °You may resume your previous home diet once your are discharged from the hospital. ° °DRESSING / WOUND CARE / SHOWERING °You may shower 3 days after surgery, but keep the wounds dry during showering.  You may use an occlusive plastic wrap (Press'n Seal for example), NO SOAKING/SUBMERGING IN THE BATHTUB.  If the bandage gets  wet, change with a clean dry gauze.  If the incision gets wet, pat the wound dry with a clean towel. °You may start showering once you are discharged home but do not submerge the incision under water. Just pat the incision dry and apply a dry gauze dressing on daily. °Change the surgical dressing daily and reapply a dry dressing each time. ° °ACTIVITY °Walk with your walker as instructed. °Use walker as long as suggested by your caregivers. °Avoid periods of inactivity such as sitting longer than an hour when not asleep. This helps prevent blood clots.  °You may resume a sexual relationship in one month or when given the OK by your doctor.  °You may return to work once you are cleared by your doctor.  °Do not drive a car for 6 weeks or until released by you surgeon.  °Do not drive while taking narcotics. ° °WEIGHT BEARING °Weight bearing as tolerated with assist device (walker, cane, etc) as directed, use it as long as suggested by your surgeon or therapist, typically at least 4-6 weeks. ° °POSTOPERATIVE CONSTIPATION PROTOCOL °Constipation - defined medically as fewer than three stools per week and severe constipation as less than one stool per week. ° °One of the most common issues patients have following surgery is constipation.  Even if you have a regular bowel pattern at home, your normal regimen is likely to be disrupted due to multiple reasons following surgery.  Combination of anesthesia, postoperative narcotics, change in appetite and fluid intake all can affect your bowels.    In order to avoid complications following surgery, here are some recommendations in order to help you during your recovery period. ° °Colace (docusate) - Pick up an over-the-counter form of Colace or another stool softener and take twice a day as long as you are requiring postoperative pain medications.  Take with a full glass of water daily.  If you experience loose stools or diarrhea, hold the colace until you stool forms back up.  If  your symptoms do not get better within 1 week or if they get worse, check with your doctor. ° °Dulcolax (bisacodyl) - Pick up over-the-counter and take as directed by the product packaging as needed to assist with the movement of your bowels.  Take with a full glass of water.  Use this product as needed if not relieved by Colace only.  ° °MiraLax (polyethylene glycol) - Pick up over-the-counter to have on hand.  MiraLax is a solution that will increase the amount of water in your bowels to assist with bowel movements.  Take as directed and can mix with a glass of water, juice, soda, coffee, or tea.  Take if you go more than two days without a movement. °Do not use MiraLax more than once per day. Call your doctor if you are still constipated or irregular after using this medication for 7 days in a row. ° °If you continue to have problems with postoperative constipation, please contact the office for further assistance and recommendations.  If you experience "the worst abdominal pain ever" or develop nausea or vomiting, please contact the office immediatly for further recommendations for treatment. ° °ITCHING ° If you experience itching with your medications, try taking only a single pain pill, or even half a pain pill at a time.  You can also use Benadryl over the counter for itching or also to help with sleep.  ° °TED HOSE STOCKINGS °Wear the elastic stockings on both legs for three weeks following surgery during the day but you may remove then at night for sleeping. ° °MEDICATIONS °See your medication summary on the “After Visit Summary” that the nursing staff will review with you prior to discharge.  You may have some home medications which will be placed on hold until you complete the course of blood thinner medication.  It is important for you to complete the blood thinner medication as prescribed by your surgeon.  Continue your approved medications as instructed at time of discharge. ° °PRECAUTIONS °If you  experience chest pain or shortness of breath - call 911 immediately for transfer to the hospital emergency department.  °If you develop a fever greater that 101 F, purulent drainage from wound, increased redness or drainage from wound, foul odor from the wound/dressing, or calf pain - CONTACT YOUR SURGEON.   °                                                °FOLLOW-UP APPOINTMENTS °Make sure you keep all of your appointments after your operation with your surgeon and caregivers. You should call the office at the above phone number and make an appointment for approximately two weeks after the date of your surgery or on the date instructed by your surgeon outlined in the "After Visit Summary". ° ° °RANGE OF MOTION AND STRENGTHENING EXERCISES  °Rehabilitation of the knee is important following a knee injury or   an operation. After just a few days of immobilization, the muscles of the thigh which control the knee become weakened and shrink (atrophy). Knee exercises are designed to build up the tone and strength of the thigh muscles and to improve knee motion. Often times heat used for twenty to thirty minutes before working out will loosen up your tissues and help with improving the range of motion but do not use heat for the first two weeks following surgery. These exercises can be done on a training (exercise) mat, on the floor, on a table or on a bed. Use what ever works the best and is most comfortable for you Knee exercises include:  Leg Lifts - While your knee is still immobilized in a splint or cast, you can do straight leg raises. Lift the leg to 60 degrees, hold for 3 sec, and slowly lower the leg. Repeat 10-20 times 2-3 times daily. Perform this exercise against resistance later as your knee gets better.  Quad and Hamstring Sets - Tighten up the muscle on the front of the thigh (Quad) and hold for 5-10 sec. Repeat this 10-20 times hourly. Hamstring sets are done by pushing the foot backward against an object  and holding for 5-10 sec. Repeat as with quad sets.   Leg Slides: Lying on your back, slowly slide your foot toward your buttocks, bending your knee up off the floor (only go as far as is comfortable). Then slowly slide your foot back down until your leg is flat on the floor again.  Angel Wings: Lying on your back spread your legs to the side as far apart as you can without causing discomfort.  A rehabilitation program following serious knee injuries can speed recovery and prevent re-injury in the future due to weakened muscles. Contact your doctor or a physical therapist for more information on knee rehabilitation.   IF YOU ARE TRANSFERRED TO A SKILLED REHAB FACILITY If the patient is transferred to a skilled rehab facility following release from the hospital, a list of the current medications will be sent to the facility for the patient to continue.  When discharged from the skilled rehab facility, please have the facility set up the patient's [place] prior to being released. Also, the skilled facility will be responsible for providing the patient with their medications at time of release from the facility to include their pain medication, the muscle relaxants, and their blood thinner medication. If the patient is still at the rehab facility at time of the two week follow up appointment, the skilled rehab facility will also need to assist the patient in arranging follow up appointment in our office and any transportation needs.  MAKE SURE YOU:  Understand these instructions.  Get help right away if you are not doing well or get worse.    Pick up stool softner and laxative for home use following surgery while on pain medications. Do not submerge incision under water. Please use good hand washing techniques while changing dressing each day. May shower starting three days after surgery. Please use a clean towel to pat the incision dry following showers. Continue to use ice for  pain and swelling after surgery. Do not use any lotions or creams on the incision until instructed by your surgeon.  May resume the home dosing of Xarelto starting on 09/01/2017.   Information on my medicine - XARELTO (Rivaroxaban)  Why was Xarelto prescribed for you? Xarelto was prescribed for you to reduce the risk of  blood clots forming after orthopedic surgery. The medical term for these abnormal blood clots is venous thromboembolism (VTE).  What do you need to know about xarelto ? Take your Xarelto ONCE DAILY at the same time every day. You may take it either with or without food.  If you have difficulty swallowing the tablet whole, you may crush it and mix in applesauce just prior to taking your dose.  Take Xarelto exactly as prescribed by your doctor and DO NOT stop taking Xarelto without talking to the doctor who prescribed the medication.  Stopping without other VTE prevention medication to take the place of Xarelto may increase your risk of developing a clot.  After discharge, you should have regular check-up appointments with your healthcare provider that is prescribing your Xarelto.    What do you do if you miss a dose? If you miss a dose, take it as soon as you remember on the same day then continue your regularly scheduled once daily regimen the next day. Do not take two doses of Xarelto on the same day.   Important Safety Information A possible side effect of Xarelto is bleeding. You should call your healthcare provider right away if you experience any of the following: ? Bleeding from an injury or your nose that does not stop. ? Unusual colored urine (red or dark brown) or unusual colored stools (red or black). ? Unusual bruising for unknown reasons. ? A serious fall or if you hit your head (even if there is no bleeding).  Some medicines may interact with Xarelto and might increase your risk of bleeding while on Xarelto. To help avoid this, consult your  healthcare provider or pharmacist prior to using any new prescription or non-prescription medications, including herbals, vitamins, non-steroidal anti-inflammatory drugs (NSAIDs) and supplements.  This website has more information on Xarelto: [email or web address]

## 2017-08-30 LAB — CBC
HEMATOCRIT: 35.9 % — AB (ref 36.0–46.0)
HEMOGLOBIN: 11.5 g/dL — AB (ref 12.0–15.0)
MCH: 32.9 pg (ref 26.0–34.0)
MCHC: 32 g/dL (ref 30.0–36.0)
MCV: 102.6 fL — ABNORMAL HIGH (ref 78.0–100.0)
Platelets: 308 10*3/uL (ref 150–400)
RBC: 3.5 MIL/uL — ABNORMAL LOW (ref 3.87–5.11)
RDW: 12.7 % (ref 11.5–15.5)
WBC: 12.7 10*3/uL — ABNORMAL HIGH (ref 4.0–10.5)

## 2017-08-30 LAB — BASIC METABOLIC PANEL
Anion gap: 9 (ref 5–15)
BUN: 11 mg/dL (ref 6–20)
CHLORIDE: 100 mmol/L — AB (ref 101–111)
CO2: 27 mmol/L (ref 22–32)
CREATININE: 0.67 mg/dL (ref 0.44–1.00)
Calcium: 9 mg/dL (ref 8.9–10.3)
GFR calc Af Amer: >60 mL/min (ref >60)
GFR calc non Af Amer: >60 mL/min (ref >60)
Glucose, Bld: 130 mg/dL — ABNORMAL HIGH (ref 65–99)
Potassium: 4 mmol/L (ref 3.5–5.1)
Sodium: 136 mmol/L (ref 135–145)

## 2017-08-30 MED ORDER — HYDROMORPHONE HCL 2 MG PO TABS
2.0000 mg | ORAL_TABLET | ORAL | Status: DC | PRN
  Administered 2017-08-30 (×2): 2 mg via ORAL
  Administered 2017-08-30 – 2017-08-31 (×4): 4 mg via ORAL
  Filled 2017-08-30: qty 1
  Filled 2017-08-30 (×4): qty 2
  Filled 2017-08-30: qty 1

## 2017-08-30 MED ORDER — METHOCARBAMOL 500 MG PO TABS: 500.0000 mg | ORAL_TABLET | Freq: Four times a day (QID) | ORAL | 0 refills | Status: DC | PRN

## 2017-08-30 NOTE — Plan of Care (Signed)
Plan of care 

## 2017-08-30 NOTE — Progress Notes (Signed)
Spoke with patient and spouse at bedside. Confirmed plan for OP PT, already arranged. Has RW and declines 3n1. [phone]

## 2017-08-30 NOTE — Progress Notes (Signed)
Physical Therapy Treatment Patient Details Name: [name] MRN: [id number] DOB: May 25, 1941 Today's Date: 08/30/2017    History of Present Illness L TKA, h/o R TKA    PT Comments    POD # 1 am session.  Assisted with amb a greater distance in hallway.  Assisted to bathroom then performed some TKR TE's following HEP handout.  Instructed on proper tech, freq as well as use of ICE.   Follow Up Recommendations  Follow surgeon's recommendation for DC plan and follow-up therapies     Equipment Recommendations  None recommended by PT    Recommendations for Other Services       Precautions / Restrictions Precautions Precautions: Knee Precaution Comments: has not been using KI Restrictions Weight Bearing Restrictions: No    Mobility  Bed Mobility               General bed mobility comments: OOB in recliner  Transfers Overall transfer level: Needs assistance Equipment used: Rolling walker (2 wheeled) Transfers: Sit to/from Stand Sit to Stand: Min guard         General transfer comment: cues for UE/LE placement  Ambulation/Gait Ambulation/Gait assistance: Min guard Ambulation Distance (Feet): 45 Feet Assistive device: Rolling walker (2 wheeled) Gait Pattern/deviations: Step-to pattern;Antalgic;Decreased step length - right;Decreased stance time - right Gait velocity: decreased   General Gait Details: cues for squence and for posture.  Tolerated an increased distance   Financial trader Rankin (Stroke Patients Only)       Balance                                            Cognition Arousal/Alertness: Awake/alert Behavior During Therapy: WFL for tasks assessed/performed Overall Cognitive Status: Within Functional Limits for tasks assessed                                        Exercises   Total Knee Replacement TE's 10 reps B LE ankle pumps 10 reps towel squeezes 10  reps knee presses 10 reps heel slides  10 reps SAQ's 10 reps SLR's 10 reps ABD Followed by ICE    General Comments        Pertinent Vitals/Pain Pain Assessment: 0-10 Pain Score: 5  Pain Location: left knee Pain Descriptors / Indicators: Cramping;Discomfort Pain Intervention(s): Monitored during session;Repositioned;Ice applied    Home Living                      Prior Function            PT Goals (current goals can now be found in the care plan section) Progress towards PT goals: Progressing toward goals    Frequency    7X/week      PT Plan Current plan remains appropriate    Co-evaluation              AM-PAC PT "6 Clicks" Daily Activity  Outcome Measure  Difficulty turning over in bed (including adjusting bedclothes, sheets and blankets)?: A Little Difficulty moving from lying on back to sitting on the side of the bed? : A Little Difficulty sitting down on and standing up from a chair with arms (e.g., wheelchair,  bedside commode, etc,.)?: A Little Help needed moving to and from a bed to chair (including a wheelchair)?: A Little Help needed walking in hospital room?: A Little Help needed climbing 3-5 steps with a railing? : A Lot 6 Click Score: 17    End of Session   Activity Tolerance: Patient tolerated treatment well Patient left: in chair;with call bell/phone within reach Nurse Communication: Mobility status PT Visit Diagnosis: Unsteadiness on feet (R26.81);Pain Pain - Right/Left: Left Pain - part of body: Knee     Time: 4854-6270 PT Time Calculation (min) (ACUTE ONLY): 28 min  Charges:  $Gait Training: 8-22 mins $Therapeutic Exercise: 8-22 mins                    G Codes:       Rica Koyanagi  PTA [facility]  Acute  Rehab Pager      [phone]

## 2017-08-30 NOTE — Progress Notes (Signed)
Physical Therapy Treatment Patient Details Name: [name] MRN: [id number] DOB: 1941-02-01 Today's Date: 08/30/2017    History of Present Illness L TKA, h/o R TKA    PT Comments    POD # 1 pm session Assisted with amb a second time then back to bed for CPM.   Follow Up Recommendations  Follow surgeon's recommendation for DC plan and follow-up therapies     Equipment Recommendations  None recommended by PT    Recommendations for Other Services       Precautions / Restrictions Precautions Precautions: Knee Precaution Comments: has not been using KI Restrictions Weight Bearing Restrictions: No    Mobility  Bed Mobility               General bed mobility comments: assisted back to bed Min Assist  Transfers Overall transfer level: Needs assistance Equipment used: Rolling walker (2 wheeled) Transfers: Sit to/from Stand Sit to Stand: Min guard         General transfer comment: cues for UE/LE placement  Ambulation/Gait Ambulation/Gait assistance: Min guard Ambulation Distance (Feet): 56 Feet Assistive device: Rolling walker (2 wheeled) Gait Pattern/deviations: Step-to pattern;Antalgic;Decreased step length - right;Decreased stance time - right Gait velocity: decreased   General Gait Details: cues for squence and for posture.  Tolerated an increased distance   Financial trader Rankin (Stroke Patients Only)       Balance                                            Cognition Arousal/Alertness: Awake/alert Behavior During Therapy: WFL for tasks assessed/performed Overall Cognitive Status: Within Functional Limits for tasks assessed                                        Exercises      General Comments        Pertinent Vitals/Pain Pain Assessment: 0-10 Pain Score: 5  Pain Location: left knee Pain Descriptors / Indicators: Cramping;Discomfort Pain  Intervention(s): Monitored during session;Repositioned;Ice applied    Home Living                      Prior Function            PT Goals (current goals can now be found in the care plan section) Progress towards PT goals: Progressing toward goals    Frequency    7X/week      PT Plan Current plan remains appropriate    Co-evaluation              AM-PAC PT "6 Clicks" Daily Activity  Outcome Measure  Difficulty turning over in bed (including adjusting bedclothes, sheets and blankets)?: A Little Difficulty moving from lying on back to sitting on the side of the bed? : A Little Difficulty sitting down on and standing up from a chair with arms (e.g., wheelchair, bedside commode, etc,.)?: A Little Help needed moving to and from a bed to chair (including a wheelchair)?: A Little Help needed walking in hospital room?: A Little Help needed climbing 3-5 steps with a railing? : A Lot 6 Click Score: 17    End of Session   Activity Tolerance:  Patient tolerated treatment well Patient left: in chair;with call bell/phone within reach Nurse Communication: Mobility status PT Visit Diagnosis: Unsteadiness on feet (R26.81);Pain Pain - Right/Left: Left Pain - part of body: Knee     Time: 1415-1440 PT Time Calculation (min) (ACUTE ONLY): 25 min  Charges:  $Gait Training: 8-22 mins $Therapeutic Activity: 8-22 mins                    G Codes:       Rica Koyanagi  PTA [facility]  Acute  Rehab Pager      [phone]

## 2017-08-30 NOTE — Evaluation (Signed)
Occupational Therapy Evaluation Patient Details Name: [name] MRN: [id number] DOB: 08/15/1940 Today's Date: 08/30/2017    History of Present Illness L TKA, h/o R TKA   Clinical Impression  Pt was admitted for the above sx. She had R TKA 5 years ago.  All education was completed. No further OT is needed at this time    Follow Up Recommendations  Supervision/Assistance - 24 hour    Equipment Recommendations  None recommended by OT    Recommendations for Other Services       Precautions / Restrictions Precautions Precautions: Knee Precaution Comments: has not been using KI Restrictions Weight Bearing Restrictions: No      Mobility Bed Mobility         Supine to sit: Supervision Sit to supine: Supervision      Transfers   Equipment used: Rolling walker (2 wheeled)   Sit to Stand: Min guard         General transfer comment: cues for UE/LE placement    Balance                                           ADL either performed or assessed with clinical judgement   ADL Overall ADL's : Needs assistance/impaired             Lower Body Bathing: Minimal assistance;Sit to/from stand       Lower Body Dressing: Moderate assistance;Sit to/from stand   Toilet Transfer: Min guard;Ambulation;BSC;RW   Toileting- Water quality scientist and Hygiene: Min guard;Sit to/from stand   Tub/ Shower Transfer: Walk-in shower;Min guard;Ambulation;Shower seat;Rolling walker     General ADL Comments: ambulated to bathroom and performed bathroom transfers.  Showed husband how to guard pt when she gets into shower.  He will assist as needed.  Noted 2 small scratches/cuts on back of thighs which bled a little:  RN aware.  Pt should be able to get up from her commode at home: sink is next to it. She doesn't want 3:1     Vision         Perception     Praxis      Pertinent Vitals/Pain Pain Score: 5  Pain Location: left knee Pain Descriptors /  Indicators: Cramping;Discomfort Pain Intervention(s): Limited activity within patient's tolerance;Monitored during session;Premedicated before session;Repositioned     Hand Dominance     Extremity/Trunk Assessment Upper Extremity Assessment Upper Extremity Assessment: Overall WFL for tasks assessed           Communication Communication Communication: No difficulties   Cognition Arousal/Alertness: Awake/alert Behavior During Therapy: WFL for tasks assessed/performed Overall Cognitive Status: Within Functional Limits for tasks assessed                                     General Comments       Exercises     Shoulder Instructions      Home Living Family/patient expects to be discharged to:: Private residence Living Arrangements: Spouse/significant other Available Help at Discharge: Family Type of Home: House             Bathroom Shower/Tub: Walk-in Corporate treasurer Toilet: Standard     Home Equipment: Environmental consultant - 2 wheels;Shower seat - built in          Prior Functioning/Environment Level of  Independence: Independent                 OT Problem List:        OT Treatment/Interventions:      OT Goals(Current goals can be found in the care plan section) Acute Rehab OT Goals Patient Stated Goal: go home,  OT Goal Formulation: All assessment and education complete, DC therapy  OT Frequency:     Barriers to D/C:            Co-evaluation              AM-PAC PT "6 Clicks" Daily Activity     Outcome Measure Help from another person eating meals?: None Help from another person taking care of personal grooming?: A Little Help from another person toileting, which includes using toliet, bedpan, or urinal?: A Little Help from another person bathing (including washing, rinsing, drying)?: A Little Help from another person to put on and taking off regular upper body clothing?: A Little Help from another person to put on and taking off  regular lower body clothing?: A Lot 6 Click Score: 18   End of Session    Activity Tolerance: Patient tolerated treatment well Patient left: in bed;with call bell/phone within reach;with nursing/sitter in room  OT Visit Diagnosis: Pain Pain - Right/Left: Left Pain - part of body: Knee                Time: 4098-1191 OT Time Calculation (min): 30 min Charges:  OT General Charges $OT Visit: 1 Visit OT Evaluation $OT Eval Low Complexity: 1 Low OT Treatments $Self Care/Home Management : 8-22 mins G-Codes:     [place], OTR/L [phone] 08/30/2017  Roman Dubuc 08/30/2017, 12:20 PM

## 2017-08-30 NOTE — Progress Notes (Signed)
   Subjective: 1 Day Post-Op Procedure(s) (LRB): LEFT TOTAL KNEE ARTHROPLASTY (Left) Patient reports pain as moderate last night.  Tough night. Patient seen in rounds for Dr. Wynelle Link. Patient is well, but has had some minor complaints of pain in the knee, requiring pain medications Started getting up with therapy . Plan is to go Home after hospital stay.  Objective: Vital signs in last 24 hours: Temp:  [97.6 F (36.4 C)-98.7 F (37.1 C)] 98.2 F (36.8 C) (03/26 2010) Pulse Rate:  [70-88] 81 (03/26 2010) Resp:  [15-17] 15 (03/26 2010) BP: (115-158)/(76-94) 158/94 (03/26 2010) SpO2:  [93 %-99 %] 99 % (03/26 2010)  Intake/Output from previous day:  Intake/Output Summary (Last 24 hours) at 08/30/2017 2025 Last data filed at 08/30/2017 2010 Gross per 24 hour  Intake 2708.75 ml  Output 1795 ml  Net 913.75 ml    Intake/Output this shift: Total I/O In: 220 [P.O.:220] Out: -   Labs: Recent Labs    08/30/17 0603  HGB 11.5*   Recent Labs    08/30/17 0603  WBC 12.7*  RBC 3.50*  HCT 35.9*  PLT 308   Recent Labs    08/30/17 0603  NA 136  K 4.0  CL 100*  CO2 27  BUN 11  CREATININE 0.67  GLUCOSE 130*  CALCIUM 9.0   No results for input(s): LABPT, INR in the last 72 hours.  EXAM General - Patient is Alert, Appropriate and Oriented Extremity - Neurovascular intact Sensation intact distally Intact pulses distally Dorsiflexion/Plantar flexion intact Dressing - dressing C/D/I Motor Function - intact, moving foot and toes well on exam.  Hemovac pulled without difficulty.  Past Medical History:  Diagnosis Date  . Anemia    history of  . Anxiety   . Arthritis   . Asthma   . Breast cancer ([place])   . Cancer ([place])    skin cancers, one melanoma   . Complication of anesthesia    patient woke up during colonoscopy  . Concussion    8 yrs. ago due to being thrown from a horse  . Dislocated elbow 10/12/2016   left  . Dysrhythmia 12/2016   A-fib  . Family history  of breast cancer   . History of radiation therapy 03/23/17-05/04/17   right chest wall 50.4 Gy in 28 fractions, right axilla 45 Gy in 25 fractions  . Hypertension   . 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 Kidney stones    hx of   . Melanoma ([place])   . Persistent atrial fibrillation ([place]) 12/16/2016   CHA2DS2VASC score 3  . Pneumonia    hx of     Assessment/Plan: 1 Day Post-Op Procedure(s) (LRB): LEFT TOTAL KNEE ARTHROPLASTY (Left) Active Problems:   OA (osteoarthritis) of knee  Estimated body mass index is 27.66 kg/m as calculated from the following:   Height as of this encounter: 5' 0.5" (1.537 m).   Weight as of this encounter: 65.3 kg (144 lb). Advance diet Up with therapy Plan for discharge tomorrow  DVT Prophylaxis - Xarelto Weight-Bearing as tolerated to left leg D/C O2 and Pulse OX and try on Room Air  Arlee Muslim, PA-C Orthopaedic Surgery 08/30/2017, 8:25 PM

## 2017-08-30 NOTE — Discharge Summary (Signed)
Physician Discharge Summary   Patient ID: [name] MRN: [id number] DOB/AGE: 1940-10-06 77 y.o.  Admit date: 08/29/2017 Discharge date: 08/31/2017  Primary Diagnosis:  Osteoarthritis  Left knee(s)   Admission Diagnoses:  Past Medical History:  Diagnosis Date  . Anemia    history of  . Anxiety   . Arthritis   . Asthma   . Breast cancer ([place])   . Cancer ([place])    skin cancers, one melanoma   . Complication of anesthesia    patient woke up during colonoscopy  . Concussion    8 yrs. ago due to being thrown from a horse  . Dislocated elbow 10/12/2016   left  . Dysrhythmia 12/2016   A-fib  . Family history of breast cancer   . History of radiation therapy 03/23/17-05/04/17   right chest wall 50.4 Gy in 28 fractions, right axilla 45 Gy in 25 fractions  . Hypertension   . 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 Kidney stones    hx of   . Melanoma ([place])   . Persistent atrial fibrillation ([place]) 12/16/2016   CHA2DS2VASC score 3  . Pneumonia    hx of    Discharge Diagnoses:   Active Problems:   OA (osteoarthritis) of knee  Estimated body mass index is 27.66 kg/m as calculated from the following:   Height as of this encounter: 5' 0.5" (1.537 m).   Weight as of this encounter: 65.3 kg (144 lb).  Procedure:  Procedure(s) (LRB): LEFT TOTAL KNEE ARTHROPLASTY (Left)   Consults: None  HPI: [name] is a 77 y.o. year old female with end stage OA of her left knee with progressively worsening pain and dysfunction. She has constant pain, with activity and at rest and significant functional deficits with difficulties even with ADLs. She has had extensive non-op management including analgesics, injections of cortisone and viscosupplements, and home exercise program, but remains in significant pain with significant dysfunction. Radiographs show bone on bone arthritis lateral and patellofemoral. She presents now for left Total Knee Arthroplasty.    Laboratory  Data: Admission on 08/29/2017  Component Date Value Ref Range Status  . WBC 08/30/2017 12.7* 4.0 - 10.5 K/uL Final  . RBC 08/30/2017 3.50* 3.87 - 5.11 MIL/uL Final  . Hemoglobin 08/30/2017 11.5* 12.0 - 15.0 g/dL Final  . HCT 08/30/2017 35.9* 36.0 - 46.0 % Final  . MCV 08/30/2017 102.6* 78.0 - 100.0 fL Final  . MCH 08/30/2017 32.9  26.0 - 34.0 pg Final  . MCHC 08/30/2017 32.0  30.0 - 36.0 g/dL Final  . RDW 08/30/2017 12.7  11.5 - 15.5 % Final  . Platelets 08/30/2017 308  150 - 400 K/uL Final   Performed at [facility], [place] [address] [place], [place] [address]  . Sodium 08/30/2017 136  135 - 145 mmol/L Final  . Potassium 08/30/2017 4.0  3.5 - 5.1 mmol/L Final  . Chloride 08/30/2017 100* 101 - 111 mmol/L Final  . CO2 08/30/2017 27  22 - 32 mmol/L Final  . Glucose, Bld 08/30/2017 130* 65 - 99 mg/dL Final  . BUN 08/30/2017 11  6 - 20 mg/dL Final  . Creatinine, Ser 08/30/2017 0.67  0.44 - 1.00 mg/dL Final  . Calcium 08/30/2017 9.0  8.9 - 10.3 mg/dL Final  . GFR calc non Af Amer 08/30/2017 >60  >60 mL/min Final  . GFR calc Af Amer 08/30/2017 >60  >60 mL/min Final   Comment: (NOTE) The eGFR has been calculated  using the CKD EPI equation. This calculation has not been validated in all clinical situations. eGFR's persistently <60 mL/min signify possible Chronic Kidney Disease.   Georgiann Hahn gap 08/30/2017 9  5 - 15 Final   Performed at [facility], [place] [address] [place], [place] [address]  . WBC 08/31/2017 12.1* 4.0 - 10.5 K/uL Final  . RBC 08/31/2017 3.37* 3.87 - 5.11 MIL/uL Final  . Hemoglobin 08/31/2017 11.1* 12.0 - 15.0 g/dL Final  . HCT 08/31/2017 35.0* 36.0 - 46.0 % Final  . MCV 08/31/2017 103.9* 78.0 - 100.0 fL Final  . MCH 08/31/2017 32.9  26.0 - 34.0 pg Final  . MCHC 08/31/2017 31.7  30.0 - 36.0 g/dL Final  . RDW 08/31/2017 12.8  11.5 - 15.5 % Final  . Platelets 08/31/2017 280  150 - 400 K/uL Final   Performed at [facility], [place] [address] [place], [place] [address]  . Sodium 08/31/2017 136  135 - 145 mmol/L Final  . Potassium 08/31/2017 4.4  3.5 - 5.1 mmol/L Final  . Chloride 08/31/2017 97* 101 - 111 mmol/L Final  . CO2 08/31/2017 29  22 - 32 mmol/L Final  . Glucose, Bld 08/31/2017 109* 65 - 99 mg/dL Final  . BUN 08/31/2017 12  6 - 20 mg/dL Final  . Creatinine, Ser 08/31/2017 0.69  0.44 - 1.00 mg/dL Final  . Calcium 08/31/2017 9.6  8.9 - 10.3 mg/dL Final  . GFR calc non Af Amer 08/31/2017 >60  >60 mL/min Final  . GFR calc Af Amer 08/31/2017 >60  >60 mL/min Final   Comment: (NOTE) The eGFR has been calculated using the CKD EPI equation. This calculation has not been validated in all clinical situations. eGFR's persistently <60 mL/min signify possible Chronic Kidney Disease.   Georgiann Hahn gap 08/31/2017 10  5 - 15 Final   Performed at [facility], [place] [address], [place], [place] [address]  Hospital Outpatient Visit on 08/25/2017  Component Date Value Ref Range Status  . aPTT 08/25/2017 42* 24 - 36 seconds Final   Comment:        IF BASELINE aPTT IS ELEVATED, SUGGEST PATIENT RISK ASSESSMENT BE USED TO DETERMINE APPROPRIATE ANTICOAGULANT THERAPY. Performed at [facility], [place] [address] [place], [place] [address]   . WBC 08/25/2017 9.1  4.0 - 10.5 K/uL Final  . RBC 08/25/2017 3.91  3.87 - 5.11 MIL/uL Final  . Hemoglobin 08/25/2017 13.0  12.0 - 15.0 g/dL Final  . HCT 08/25/2017 39.8  36.0 - 46.0 % Final  . MCV 08/25/2017 101.8* 78.0 - 100.0 fL Final  . MCH 08/25/2017 33.2  26.0 - 34.0 pg Final  . MCHC 08/25/2017 32.7  30.0 - 36.0 g/dL Final  . RDW 08/25/2017 12.7  11.5 - 15.5 % Final  . Platelets 08/25/2017 320  150 - 400 K/uL Final   Performed at [facility], [place] [address], [place],  [address]  . Sodium 08/25/2017 137  135 - 145 mmol/L Final  . Potassium 08/25/2017 4.5  3.5 - 5.1 mmol/L Final  . Chloride 08/25/2017 98* 101  - 111 mmol/L Final  . CO2 08/25/2017 29  22 - 32 mmol/L Final  . Glucose, Bld 08/25/2017 96  65 - 99 mg/dL Final  . BUN 08/25/2017 11  6 - 20 mg/dL Final  . Creatinine, Ser 08/25/2017 0.67  0.44 - 1.00 mg/dL Final  . Calcium 08/25/2017 9.8  8.9 - 10.3 mg/dL Final  .  Total Protein 08/25/2017 7.1  6.5 - 8.1 g/dL Final  . Albumin 08/25/2017 3.7  3.5 - 5.0 g/dL Final  . AST 08/25/2017 21  15 - 41 U/L Final  . ALT 08/25/2017 11* 14 - 54 U/L Final  . Alkaline Phosphatase 08/25/2017 74  38 - 126 U/L Final  . Total Bilirubin 08/25/2017 0.5  0.3 - 1.2 mg/dL Final  . GFR calc non Af Amer 08/25/2017 >60  >60 mL/min Final  . GFR calc Af Amer 08/25/2017 >60  >60 mL/min Final   Comment: (NOTE) The eGFR has been calculated using the CKD EPI equation. This calculation has not been validated in all clinical situations. eGFR's persistently <60 mL/min signify possible Chronic Kidney Disease.   Georgiann Hahn gap 08/25/2017 10  5 - 15 Final   Performed at [facility], [place] [address], [place], [place] [address]  . Prothrombin Time 08/25/2017 23.5* 11.4 - 15.2 seconds Final  . INR 08/25/2017 2.12   Final   Performed at [facility], [place] [address] [place], [place] [address]  . ABO/RH(D) 08/25/2017 O POS   Final  . Antibody Screen 08/25/2017 NEG   Final  . Sample Expiration 08/25/2017 09/01/2017   Final  . Extend sample reason 08/25/2017    Final                   Value:NO TRANSFUSIONS OR PREGNANCY IN THE PAST 3 MONTHS Performed at [facility], [place] [address] [place], [place] [address]   . MRSA, PCR 08/25/2017 NEGATIVE  NEGATIVE Final  . Staphylococcus aureus 08/25/2017 POSITIVE* NEGATIVE Final   Comment: (NOTE) The Xpert SA Assay (FDA approved for NASAL specimens in patients 3 years of age and older), is one component of a comprehensive surveillance program. It is not intended to diagnose infection nor to guide or monitor treatment. Performed at  [facility], [place] [address] [place], [place] [address]   Appointment on 07/25/2017  Component Date Value Ref Range Status  . Sodium 07/25/2017 137  136 - 145 mmol/L Final  . Potassium 07/25/2017 3.7  3.5 - 5.1 mmol/L Final  . Chloride 07/25/2017 101  98 - 109 mmol/L Final  . CO2 07/25/2017 25  22 - 29 mmol/L Final  . Glucose, Bld 07/25/2017 90  70 - 140 mg/dL Final  . BUN 07/25/2017 14  7 - 26 mg/dL Final  . Creatinine 07/25/2017 0.79  0.60 - 1.10 mg/dL Final  . Calcium 07/25/2017 9.9  8.4 - 10.4 mg/dL Final  . Total Protein 07/25/2017 7.3  6.4 - 8.3 g/dL Final  . Albumin 07/25/2017 3.6  3.5 - 5.0 g/dL Final  . AST 07/25/2017 21  5 - 34 U/L Final  . ALT 07/25/2017 7  0 - 55 U/L Final  . Alkaline Phosphatase 07/25/2017 92  40 - 150 U/L Final  . Total Bilirubin 07/25/2017 0.6  0.2 - 1.2 mg/dL Final  . GFR, Est Non Af Am 07/25/2017 >60  >60 mL/min Final  . GFR, Est AFR Am 07/25/2017 >60  >60 mL/min Final   Comment: (NOTE) The eGFR has been calculated using the CKD EPI equation. This calculation has not been validated in all clinical situations. eGFR's persistently <60 mL/min signify possible Chronic Kidney Disease.   Georgiann Hahn gap 07/25/2017 11  3 - 11 Final   Performed at [facility], [place] [address], [place], [place] [address]  . WBC Count 07/25/2017 7.2  3.9 -  10.3 K/uL Final  . RBC 07/25/2017 3.98  3.70 - 5.45 MIL/uL Final  . Hemoglobin 07/25/2017 13.4  11.6 - 15.9 g/dL Final  . HCT 07/25/2017 39.9  34.8 - 46.6 % Final  . MCV 07/25/2017 100.3  79.5 - 101.0 fL Final  . MCH 07/25/2017 33.6  25.1 - 34.0 pg Final  . MCHC 07/25/2017 33.5  31.5 - 36.0 g/dL Final  . RDW 07/25/2017 13.9  11.2 - 14.5 % Final  . Platelet Count 07/25/2017 287  145 - 400 K/uL Final  . Neutrophils Relative % 07/25/2017 66  % Final  . Neutro Abs 07/25/2017 4.8  1.5 - 6.5 K/uL Final  . Lymphocytes Relative 07/25/2017 15  % Final  . Lymphs Abs 07/25/2017 1.1  0.9  - 3.3 K/uL Final  . Monocytes Relative 07/25/2017 12  % Final  . Monocytes Absolute 07/25/2017 0.9  0.1 - 0.9 K/uL Final  . Eosinophils Relative 07/25/2017 6  % Final  . Eosinophils Absolute 07/25/2017 0.5  0.0 - 0.5 K/uL Final  . Basophils Relative 07/25/2017 1  % Final  . Basophils Absolute 07/25/2017 0.0  0.0 - 0.1 K/uL Final   Performed at [facility], [place] [address], [place],  [address]     X-Rays:No results found.  EKG: Orders placed or performed during the hospital encounter of 01/29/17  . ED EKG  . ED EKG  . EKG     Hospital Course: [name] is a 77 y.o. who was admitted to [facility]. They were brought to the operating room on 08/29/2017 and underwent Procedure(s): LEFT TOTAL KNEE ARTHROPLASTY.  Patient tolerated the procedure well and was later transferred to the recovery room and then to the orthopaedic floor for postoperative care.  They were given PO and IV analgesics for pain control following their surgery.  They were given 24 hours of postoperative antibiotics of  Anti-infectives (From admission, onward)   Start     Dose/Rate Route Frequency Ordered Stop   08/29/17 1400  ceFAZolin (ANCEF) IVPB 1 g/50 mL premix     1 g 100 mL/hr over 30 Minutes Intravenous Every 6 hours 08/29/17 1112 08/29/17 2130   08/29/17 0709  ceFAZolin (ANCEF) 2-4 GM/100ML-% IVPB  Status:  Discontinued    Note to Pharmacy:  Harle Stanford   : cabinet override      08/29/17 0709 08/29/17 0718   08/29/17 0551  ceFAZolin (ANCEF) IVPB 2g/100 mL premix     2 g 200 mL/hr over 30 Minutes Intravenous On call to O.R. 08/29/17 4132 08/29/17 0737     and started on DVT prophylaxis in the form of Xarelto.   PT and OT were ordered for total joint protocol.  Discharge planning consulted to help with postop disposition and equipment needs.  Patient had a decent night on the evening of surge2ry.  They started to get up OOB with therapy on day one. Hemovac drain was  pulled without difficulty.  Continued to work with therapy into day two.  Dressing was changed on day two and the incision was healing well. Patient was seen in rounds on day two and was ready to go home.   Diet: Cardiac diet Activity:WBAT Follow-up:in 2 weeks Disposition - Home Discharged Condition: stable   Discharge Instructions    Call MD / Call 911   Complete by:  As directed    If you experience chest pain or shortness of breath, CALL 911 and be transported to the  hospital emergency room.  If you develope a fever above 101 F, pus (white drainage) or increased drainage or redness at the wound, or calf pain, call your surgeon's office.   Change dressing   Complete by:  As directed    Change dressing daily with sterile 4 x 4 inch gauze dressing and apply TED hose. Do not submerge the incision under water.   Constipation Prevention   Complete by:  As directed    Drink plenty of fluids.  Prune juice may be helpful.  You may use a stool softener, such as Colace (over the counter) 100 mg twice a day.  Use MiraLax (over the counter) for constipation as needed.   Diet - low sodium heart healthy   Complete by:  As directed    Discharge instructions   Complete by:  As directed    May resume the home dosing of Xarelto starting on 09/01/2017   Pick up stool softner and laxative for home use following surgery while on pain medications. Do not submerge incision under water. Please use good hand washing techniques while changing dressing each day. May shower starting three days after surgery. Please use a clean towel to pat the incision dry following showers. Continue to use ice for pain and swelling after surgery. Do not use any lotions or creams on the incision until instructed by your surgeon.  Wear both TED hose on both legs during the day every day for three weeks, but may remove the TED hose at night at home.  Postoperative Constipation Protocol  Constipation - defined medically as  fewer than three stools per week and severe constipation as less than one stool per week.  One of the most common issues patients have following surgery is constipation.  Even if you have a regular bowel pattern at home, your normal regimen is likely to be disrupted due to multiple reasons following surgery.  Combination of anesthesia, postoperative narcotics, change in appetite and fluid intake all can affect your bowels.  In order to avoid complications following surgery, here are some recommendations in order to help you during your recovery period.  Colace (docusate) - Pick up an over-the-counter form of Colace or another stool softener and take twice a day as long as you are requiring postoperative pain medications.  Take with a full glass of water daily.  If you experience loose stools or diarrhea, hold the colace until you stool forms back up.  If your symptoms do not get better within 1 week or if they get worse, check with your doctor.  Dulcolax (bisacodyl) - Pick up over-the-counter and take as directed by the product packaging as needed to assist with the movement of your bowels.  Take with a full glass of water.  Use this product as needed if not relieved by Colace only.   MiraLax (polyethylene glycol) - Pick up over-the-counter to have on hand.  MiraLax is a solution that will increase the amount of water in your bowels to assist with bowel movements.  Take as directed and can mix with a glass of water, juice, soda, coffee, or tea.  Take if you go more than two days without a movement. Do not use MiraLax more than once per day. Call your doctor if you are still constipated or irregular after using this medication for 7 days in a row.  If you continue to have problems with postoperative constipation, please contact the office for further assistance and recommendations.  If you experience "the  worst abdominal pain ever" or develop nausea or vomiting, please contact the office immediatly for  further recommendations for treatment.   Do not put a pillow under the knee. Place it under the heel.   Complete by:  As directed    Do not sit on low chairs, stoools or toilet seats, as it may be difficult to get up from low surfaces   Complete by:  As directed    Driving restrictions   Complete by:  As directed    No driving until released by the physician.   Increase activity slowly as tolerated   Complete by:  As directed    Lifting restrictions   Complete by:  As directed    No lifting until released by the physician.   Patient may shower   Complete by:  As directed    You may shower without a dressing once there is no drainage.  Do not wash over the wound.  If drainage remains, do not shower until drainage stops.   TED hose   Complete by:  As directed    Use stockings (TED hose) for 3 weeks on both leg(s).  You may remove them at night for sleeping.   Weight bearing as tolerated   Complete by:  As directed    Laterality:  left   Extremity:  Lower     Allergies as of 08/31/2017      Reactions   Chlorhexidine Gluconate Hives, Swelling, Rash   SWELLING REACTION UNSPECIFIED    "ChloraPrep "   Nsaids    avoid due to gerd   Tylenol [acetaminophen]    Avoid due to GERD   Codeine Nausea Only      Medication List    STOP taking these medications   COLLAGEN PO   diclofenac sodium 1 % Gel Commonly known as:  VOLTAREN   FISH OIL PO   METHYLFOLATE 400 MCG Caps Generic drug:  Levomefolate Glucosamine   MILK THISTLE PO   MSM 1000 MG Caps   multivitamin with minerals Tabs tablet   NATTOKINASE PO   NON FORMULARY   OVER THE COUNTER MEDICATION   oxyCODONE 5 MG immediate release tablet Commonly known as:  Oxy IR/ROXICODONE   PROBIOTIC PO   Red Yeast Rice 600 MG Caps   RESVERATROL PO   SELENIMIN PO   Turmeric 400 MG Caps   Ubiquinol 100 MG Caps   vitamin C 1000 MG tablet   Vitamin D3 5000 units Caps   ZINC PO     TAKE these medications   albuterol  108 (90 Base) MCG/ACT inhaler Commonly known as:  PROVENTIL HFA;VENTOLIN HFA Inhale 2 puffs into the lungs every 6 (six) hours as needed. For wheezing/shortness of breath   ALPRAZolam 0.5 MG tablet Commonly known as:  XANAX Take 0.5 mg by mouth daily as needed for anxiety.   AZO TABS PO Take 1 tablet by mouth daily as needed (buring/pain).   bisacodyl 5 MG EC tablet Commonly known as:  DULCOLAX Take 5 mg by mouth daily as needed for moderate constipation.   BREO ELLIPTA 100-25 MCG/INH Aepb Generic drug:  fluticasone furoate-vilanterol Inhale 1 puff into the lungs daily.   clobetasol cream 0.05 % Commonly known as:  TEMOVATE Apply 1 application topically daily as needed (eczema).   desonide 0.05 % cream Commonly known as:  DESOWEN Apply 1 application topically 2 (two) times daily as needed (for skin irritation.).   EPIPEN 2-PAK 0.3 mg/0.3 mL Soaj injection Generic drug:  EPINEPHrine Inject 0.3 mg into the muscle once.   Guaifenesin 1200 MG Tb12 Take 1,200 mg by mouth daily as needed (congestion).   HYDROmorphone 2 MG tablet Commonly known as:  DILAUDID Take 1-2 tablets (2-4 mg total) by mouth every 4 (four) hours as needed for moderate pain or severe pain.   letrozole 2.5 MG tablet Commonly known as:  FEMARA TAKE 1 TABLET BY MOUTH EVERY DAY   levocetirizine 5 MG tablet Commonly known as:  XYZAL Take 5 mg by mouth at bedtime.   methocarbamol 500 MG tablet Commonly known as:  ROBAXIN Take 1 tablet (500 mg total) by mouth every 6 (six) hours as needed for muscle spasms.   Potassium 99 MG Tabs Take 99 mg by mouth daily.   propranolol ER 80 MG 24 hr capsule Commonly known as:  INDERAL LA Take 80 mg by mouth daily.   ranitidine 150 MG capsule Commonly known as:  ZANTAC Take 1 capsule (150 mg total) by mouth daily. What changed:    when to take this  reasons to take this   RESTASIS 0.05 % ophthalmic emulsion Generic drug:  cycloSPORINE Place 1 drop into  both eyes daily.   rivaroxaban 20 MG Tabs tablet Commonly known as:  XARELTO Take 1 tablet (20 mg total) by mouth daily with supper. May resume dosing at home following discharge starting Thursday 09/01/2017 What changed:  additional instructions   tetrahydrozoline-zinc 0.05-0.25 % ophthalmic solution Commonly known as:  VISINE-AC Place 2 drops into both eyes 3 (three) times daily as needed (for allergy eyes/dry eye).   traMADol 50 MG tablet Commonly known as:  ULTRAM Take 50 mg by mouth daily as needed for moderate pain.   triamterene-hydrochlorothiazide 37.5-25 MG tablet Commonly known as:  MAXZIDE-25 Take 1 tablet by mouth every morning.   venlafaxine XR 75 MG 24 hr capsule Commonly known as:  EFFEXOR XR Take 1 capsule (75 mg total) by mouth daily with breakfast.            Discharge Care Instructions  (From admission, onward)        Start     Ordered   08/30/17 0000  Weight bearing as tolerated    Question Answer Comment  Laterality left   Extremity Lower      08/30/17 2036   08/30/17 0000  Change dressing    Comments:  Change dressing daily with sterile 4 x 4 inch gauze dressing and apply TED hose. Do not submerge the incision under water.   08/30/17 2036     Follow-up Information    Gaynelle Arabian, MD. Schedule an appointment as soon as possible for a visit on 09/13/2017.   Specialty:  Orthopedic Surgery Contact information: [address] [place] [address] [phone]           Signed: Arlee Muslim, PA-C Orthopaedic Surgery 08/31/2017, 7:19 AM

## 2017-08-31 LAB — CBC
HEMATOCRIT: 35 % — AB (ref 36.0–46.0)
Hemoglobin: 11.1 g/dL — ABNORMAL LOW (ref 12.0–15.0)
MCH: 32.9 pg (ref 26.0–34.0)
MCHC: 31.7 g/dL (ref 30.0–36.0)
MCV: 103.9 fL — AB (ref 78.0–100.0)
Platelets: 280 10*3/uL (ref 150–400)
RBC: 3.37 MIL/uL — ABNORMAL LOW (ref 3.87–5.11)
RDW: 12.8 % (ref 11.5–15.5)
WBC: 12.1 10*3/uL — ABNORMAL HIGH (ref 4.0–10.5)

## 2017-08-31 LAB — BASIC METABOLIC PANEL
ANION GAP: 10 (ref 5–15)
BUN: 12 mg/dL (ref 6–20)
CHLORIDE: 97 mmol/L — AB (ref 101–111)
CO2: 29 mmol/L (ref 22–32)
CREATININE: 0.69 mg/dL (ref 0.44–1.00)
Calcium: 9.6 mg/dL (ref 8.9–10.3)
GFR calc non Af Amer: >60 mL/min (ref >60)
Glucose, Bld: 109 mg/dL — ABNORMAL HIGH (ref 65–99)
Potassium: 4.4 mmol/L (ref 3.5–5.1)
Sodium: 136 mmol/L (ref 135–145)

## 2017-08-31 MED ORDER — RIVAROXABAN 20 MG PO TABS: 20.0000 mg | ORAL_TABLET | Freq: Every day | ORAL | 0 refills | Status: DC

## 2017-08-31 MED ORDER — HYDROMORPHONE HCL 2 MG PO TABS: 2.0000 mg | ORAL_TABLET | ORAL | 0 refills | Status: DC | PRN

## 2017-08-31 MED ORDER — METHOCARBAMOL 500 MG PO TABS: 500.0000 mg | ORAL_TABLET | Freq: Four times a day (QID) | ORAL | 0 refills | Status: DC | PRN

## 2017-08-31 NOTE — Progress Notes (Signed)
Physical Therapy Treatment Patient Details Name: [name] MRN: [id number] DOB: 05-28-1941 Today's Date: 08/31/2017    History of Present Illness L TKA, h/o R TKA    PT Comments    POD # 2 am session Assisted OOB to amb in hallway, practice one step and performed all supine TKR TE's followed by ICE.   Pt has met goals to D/C to home.   Follow Up Recommendations  Follow surgeon's recommendation for DC plan and follow-up therapies     Equipment Recommendations  None recommended by PT    Recommendations for Other Services       Precautions / Restrictions Precautions Precautions: Knee Precaution Comments: has not been using KI Restrictions Weight Bearing Restrictions: No    Mobility  Bed Mobility Overal bed mobility: Needs Assistance Bed Mobility: Supine to Sit;Sit to Supine     Supine to sit: Supervision Sit to supine: Supervision   General bed mobility comments: increased time  Transfers Overall transfer level: Needs assistance Equipment used: Rolling walker (2 wheeled) Transfers: Sit to/from [facility] Sit to Stand: Supervision;Min guard Stand pivot transfers: Supervision;Min guard       General transfer comment: cues for UE/LE placement and increased time.  Also assisted in/out bathroom.  Ambulation/Gait Ambulation/Gait assistance: Supervision;Min guard Ambulation Distance (Feet): 65 Feet Assistive device: Rolling walker (2 wheeled) Gait Pattern/deviations: Step-to pattern;Antalgic;Decreased step length - right;Decreased stance time - right Gait velocity: decreased   General Gait Details: cues for squence and for posture.  Tolerated an increased distance   Stairs Stairs: Yes   Stair Management: No rails;Step to pattern;Forwards;With walker Number of Stairs: 1 General stair comments: performed twice one step forward with walker and < 25% VC's on proper sequencing  Wheelchair Mobility    Modified Rankin (Stroke Patients  Only)       Balance                                            Cognition Arousal/Alertness: Awake/alert Behavior During Therapy: WFL for tasks assessed/performed Overall Cognitive Status: Within Functional Limits for tasks assessed                                        Exercises   Total Knee Replacement TE's 10 reps B LE ankle pumps 10 reps towel squeezes 10 reps knee presses 10 reps heel slides  10 reps SAQ's 10 reps SLR's 10 reps ABD Followed by ICE     General Comments        Pertinent Vitals/Pain Pain Assessment: 0-10 Pain Score: 7  Pain Location: left knee Pain Descriptors / Indicators: Cramping;Discomfort;Operative site guarding Pain Intervention(s): Monitored during session;Premedicated before session;Repositioned;Ice applied    Home Living                      Prior Function            PT Goals (current goals can now be found in the care plan section) Progress towards PT goals: Progressing toward goals    Frequency    7X/week      PT Plan Current plan remains appropriate    Co-evaluation              AM-PAC PT "6 Clicks" Daily Activity  Outcome  Measure  Difficulty turning over in bed (including adjusting bedclothes, sheets and blankets)?: A Little Difficulty moving from lying on back to sitting on the side of the bed? : A Little Difficulty sitting down on and standing up from a chair with arms (e.g., wheelchair, bedside commode, etc,.)?: A Little Help needed moving to and from a bed to chair (including a wheelchair)?: A Little Help needed walking in hospital room?: A Little Help needed climbing 3-5 steps with a railing? : A Lot 6 Click Score: 17    End of Session Equipment Utilized During Treatment: Gait belt Activity Tolerance: Patient tolerated treatment well Patient left: with call bell/phone within reach;in bed Nurse Communication: Mobility status PT Visit Diagnosis: Unsteadiness  on feet (R26.81);Pain Pain - Right/Left: Left Pain - part of body: Knee     Time: 1030-1055 PT Time Calculation (min) (ACUTE ONLY): 25 min  Charges:  $Gait Training: 8-22 mins $Therapeutic Exercise: 8-22 mins                    G Codes:       Rica Koyanagi  PTA  [facility]  Acute  Rehab Pager      [phone]

## 2017-09-08 ENCOUNTER — Ambulatory Visit: Payer: Self-pay | Admitting: Radiation Oncology

## 2017-10-03 ENCOUNTER — Encounter: Payer: Self-pay | Admitting: Radiation Oncology

## 2017-10-03 ENCOUNTER — Ambulatory Visit
Admission: RE | Admit: 2017-10-03 | Discharge: 2017-10-03 | Disposition: A | Discharge status: Home / Self Care | Payer: Medicare Other | Source: Ambulatory Visit | Attending: Radiation Oncology | Admitting: Radiation Oncology

## 2017-10-03 ENCOUNTER — Other Ambulatory Visit: Payer: Self-pay

## 2017-10-03 VITALS — BP 142/71 | HR 85 | Temp 98.4°F | Resp 18 | Wt 144.2 lb

## 2017-10-03 DIAGNOSIS — Z923 Personal history of irradiation: Secondary | ICD-10-CM | POA: Diagnosis not present

## 2017-10-03 DIAGNOSIS — Z170 Estrogen receptor positive status [ER+]: Secondary | ICD-10-CM

## 2017-10-03 DIAGNOSIS — Z79811 Long term (current) use of aromatase inhibitors: Secondary | ICD-10-CM | POA: Diagnosis not present

## 2017-10-03 DIAGNOSIS — Z79899 Other long term (current) drug therapy: Secondary | ICD-10-CM | POA: Insufficient documentation

## 2017-10-03 DIAGNOSIS — C50211 Malignant neoplasm of upper-inner quadrant of right female breast: Secondary | ICD-10-CM | POA: Insufficient documentation

## 2017-10-03 NOTE — Progress Notes (Addendum)
Ms. Dougher is here for  A follow-up appointment.Patient denies any pain in her breast area. States that she has mild fatigue.States that she has discoloration in her breast area. States that she is using a vitamin E cream. Vitals:   10/03/17 1141  BP: (!) 142/71  Pulse: 85  Resp: 18  Temp: 98.4 F (36.9 C)  TempSrc: Oral  SpO2: 95%  Weight: 144 lb 4 oz (65.4 kg)   Wt Readings from Last 3 Encounters:  10/03/17 144 lb 4 oz (65.4 kg)  08/29/17 144 lb (65.3 kg)  08/25/17 144 lb 8 oz (65.5 kg)

## 2017-10-03 NOTE — Progress Notes (Signed)
Radiation Oncology         [phone] ________________________________  Name: [name] MRN: [id number]  Date: 10/03/2017  DOB: 01/05/1941  Follow-Up Visit Note  CC: [name], MD  Excell Seltzer, MD    ICD-10-CM   1. Malignant neoplasm of upper-inner quadrant of right breast in female, estrogen receptor positive [facility]) C50.211    Z17.0     Diagnosis:   Invasive lobular carcinoma, pT3, pN0, pMX  Interval Since Last Radiation:  5 months  03/23/2017- 05/04/2017  Narrative:  The patient returns today for routine follow-up. She has been doing well, overall. She has some discoloration to her breast area and is using vitamin E cream. She also endorses mild fatigue. The patient is currently taking letrozole, which she is tolerating well. She denies pain, fever, chills or any other symptoms or complaints at this time.                               ALLERGIES:  is allergic to chlorhexidine gluconate; nsaids; tylenol [acetaminophen]; and codeine.  Meds: Current Outpatient Medications  Medication Sig Dispense Refill  . albuterol (PROVENTIL HFA;VENTOLIN HFA) 108 (90 Base) MCG/ACT inhaler Inhale 2 puffs into the lungs every 6 (six) hours as needed. For wheezing/shortness of breath    . ALPRAZolam (XANAX) 0.5 MG tablet Take 0.5 mg by mouth daily as needed for anxiety.     . bisacodyl (DULCOLAX) 5 MG EC tablet Take 5 mg by mouth daily as needed for moderate constipation.    Marland Kitchen BREO ELLIPTA 100-25 MCG/INH AEPB Inhale 1 puff into the lungs daily.    Marland Kitchen desonide (DESOWEN) 0.05 % cream Apply 1 application topically 2 (two) times daily as needed (for skin irritation.).    Marland Kitchen EPINEPHrine (EPIPEN 2-PAK) 0.3 mg/0.3 mL IJ SOAJ injection Inject 0.3 mg into the muscle once.    Marland Kitchen letrozole (FEMARA) 2.5 MG tablet TAKE 1 TABLET BY MOUTH EVERY DAY 30 tablet 2  . levocetirizine (XYZAL) 5 MG tablet Take 5 mg by mouth at bedtime.     Marland Kitchen Phenazopyridine HCl (AZO TABS PO) Take 1 tablet by mouth daily  as needed (buring/pain).    . Potassium 99 MG TABS Take 99 mg by mouth daily.     . propranolol ER (INDERAL LA) 80 MG 24 hr capsule Take 80 mg by mouth daily.    . ranitidine (ZANTAC) 150 MG capsule Take 1 capsule (150 mg total) by mouth daily. (Patient taking differently: Take 150 mg by mouth daily as needed for heartburn. ) 30 capsule 0  . RESTASIS 0.05 % ophthalmic emulsion Place 1 drop into both eyes daily.     . rivaroxaban (XARELTO) 20 MG TABS tablet Take 1 tablet (20 mg total) by mouth daily with supper. May resume dosing at home following discharge starting Thursday 09/01/2017 30 tablet 0  . triamterene-hydrochlorothiazide (MAXZIDE-25) 37.5-25 MG per tablet Take 1 tablet by mouth every morning.     . venlafaxine XR (EFFEXOR XR) 75 MG 24 hr capsule Take 1 capsule (75 mg total) by mouth daily with breakfast. 90 capsule 2  . clobetasol cream (TEMOVATE) 4.09 % Apply 1 application topically daily as needed (eczema).    . diclofenac sodium (VOLTAREN) 1 % GEL diclofenac 1 % topical gel    . Guaifenesin 1200 MG TB12 Take 1,200 mg by mouth daily as needed (congestion).    Marland Kitchen HYDROmorphone (DILAUDID) 2 MG tablet Take 1-2 tablets (  2-4 mg total) by mouth every 4 (four) hours as needed for moderate pain or severe pain. (Patient not taking: Reported on 10/03/2017) 80 tablet 0  . methocarbamol (ROBAXIN) 500 MG tablet Take 1 tablet (500 mg total) by mouth every 6 (six) hours as needed for muscle spasms. (Patient not taking: Reported on 10/03/2017) 80 tablet 0  . tetrahydrozoline-zinc (VISINE-AC) 0.05-0.25 % ophthalmic solution Place 2 drops into both eyes 3 (three) times daily as needed (for allergy eyes/dry eye).    . traMADol (ULTRAM) 50 MG tablet Take 50 mg by mouth daily as needed for moderate pain.     No current facility-administered medications for this encounter.     Physical Findings: The patient is in no acute distress. Patient is alert and oriented.  weight is 144 lb 4 oz (65.4 kg). Her oral  temperature is 98.4 F (36.9 C). Her blood pressure is 142/71 (abnormal) and her pulse is 85. Her respiration is 18 and oxygen saturation is 95%. .  No significant changes.  Lungs are clear to auscultation bilaterally. Heart has regular rate and rhythm. No palpable cervical, supraclavicular, or axillary adenopathy. Abdomen soft, non-tender, normal bowel sounds. Left breast no palpable mass or nipple discharge. Right chest tissue expander in place, some deformity noted. No tenderness with palpation. The patient has erythema inferior to right chest wall extending into right upper abdomen. Doubt this is cellulitis, but more of an inflammatory situation.    Lab Findings: Lab Results  Component Value Date   WBC 12.1 (H) 08/31/2017   HGB 11.1 (L) 08/31/2017   HCT 35.0 (L) 08/31/2017   MCV 103.9 (H) 08/31/2017   PLT 280 08/31/2017    Radiographic Findings: No results found.  Impression:  The patient is recovering from the effects of radiation.  No evidence of recurrence on clinical exam.  Plan:  Patient will use steroid cream for her reaction along the right upper abdomen. She will return in 2 weeks. If this condition does not clear up we will consider antibiotics.   ____________________________________  Blair Promise, PhD, MD  This document serves as a record of services personally performed by Blair Promise, PhD, MD. It was created on his behalf by Margit Banda, a trained medical scribe. The creation of this record is based on the scribe's personal observations and the provider's statements to them. This document has been checked and approved by the attending provider.

## 2017-10-17 ENCOUNTER — Other Ambulatory Visit: Payer: Self-pay

## 2017-10-17 ENCOUNTER — Ambulatory Visit
Admission: RE | Admit: 2017-10-17 | Discharge: 2017-10-17 | Disposition: A | Discharge status: Home / Self Care | Payer: Medicare Other | Source: Ambulatory Visit | Attending: Radiation Oncology | Admitting: Radiation Oncology

## 2017-10-17 ENCOUNTER — Encounter: Payer: Self-pay | Admitting: Radiation Oncology

## 2017-10-17 VITALS — BP 143/97 | HR 72 | Temp 98.5°F | Resp 18 | Wt 140.4 lb

## 2017-10-17 DIAGNOSIS — L03319 Cellulitis of trunk, unspecified: Secondary | ICD-10-CM | POA: Diagnosis not present

## 2017-10-17 DIAGNOSIS — Z85828 Personal history of other malignant neoplasm of skin: Secondary | ICD-10-CM | POA: Insufficient documentation

## 2017-10-17 DIAGNOSIS — Z79899 Other long term (current) drug therapy: Secondary | ICD-10-CM | POA: Diagnosis not present

## 2017-10-17 DIAGNOSIS — C50211 Malignant neoplasm of upper-inner quadrant of right female breast: Secondary | ICD-10-CM | POA: Diagnosis present

## 2017-10-17 DIAGNOSIS — Z170 Estrogen receptor positive status [ER+]: Secondary | ICD-10-CM

## 2017-10-17 MED ORDER — DOXYCYCLINE HYCLATE 100 MG PO TABS: 100.0000 mg | ORAL_TABLET | Freq: Two times a day (BID) | ORAL | 0 refills | Status: DC

## 2017-10-17 NOTE — Progress Notes (Addendum)
Patient denies any pain in her breast area. Denies any fatigue.States that she is using sonafine  Cream as ordered. States that she is having some itching to the skin.Skin is hyperpigmented. Vitals:   10/17/17 1201  BP: (!) 143/97  Pulse: 72  Resp: 18  Temp: 98.5 F (36.9 C)  TempSrc: Oral  SpO2: 98%  Weight: 140 lb 6 oz (63.7 kg)   Wt Readings from Last 3 Encounters:  10/17/17 140 lb 6 oz (63.7 kg)  10/03/17 144 lb 4 oz (65.4 kg)  08/29/17 144 lb (65.3 kg)

## 2017-10-17 NOTE — Progress Notes (Signed)
Radiation Oncology         [phone] ________________________________  Name: [name] MRN: [id number]  Date: 10/17/2017  DOB: 12/10/1940  Follow-Up Visit Note  CC: [name], MD  Excell Seltzer, MD    ICD-10-CM   1. Malignant neoplasm of upper-inner quadrant of right breast in female, estrogen receptor positive ([place]) C50.211    Z17.0     Diagnosis:   77 y.o. female with Invasive lobular carcinoma, pT3, pN0, pMX  Interval Since Last Radiation:  6 months  Radiation treatment dates:   03/23/2017-05/04/2017 Site/dose:   1. CW_Rt / 50.4 Gy in 28 fractions  2. Axilla Rt / 45 Gy in 25 fractions  Narrative:  The patient returns today for close follow-up for a reaction along her right upper abdomen. She states she was recently on an antibiotic for a skin cancer removal on 10/04/17 (one dose).  She denies any pain in her breast area. She denies any fatigue. She reports some itching to the skin and states that she is using sonafine cream as ordered.                     ALLERGIES:  is allergic to chlorhexidine gluconate; nsaids; tylenol [acetaminophen]; and codeine.  Meds: Current Outpatient Medications  Medication Sig Dispense Refill  . albuterol (PROVENTIL HFA;VENTOLIN HFA) 108 (90 Base) MCG/ACT inhaler Inhale 2 puffs into the lungs every 6 (six) hours as needed. For wheezing/shortness of breath    . ALPRAZolam (XANAX) 0.5 MG tablet Take 0.5 mg by mouth daily as needed for anxiety.     . bisacodyl (DULCOLAX) 5 MG EC tablet Take 5 mg by mouth daily as needed for moderate constipation.    Marland Kitchen BREO ELLIPTA 100-25 MCG/INH AEPB Inhale 1 puff into the lungs daily.    . clobetasol cream (TEMOVATE) 1.60 % Apply 1 application topically daily as needed (eczema).    Marland Kitchen desonide (DESOWEN) 0.05 % cream Apply 1 application topically 2 (two) times daily as needed (for skin irritation.).    Marland Kitchen diclofenac sodium (VOLTAREN) 1 % GEL diclofenac 1 % topical gel    . EPINEPHrine (EPIPEN  2-PAK) 0.3 mg/0.3 mL IJ SOAJ injection Inject 0.3 mg into the muscle once.    Marland Kitchen letrozole (FEMARA) 2.5 MG tablet TAKE 1 TABLET BY MOUTH EVERY DAY 30 tablet 2  . levocetirizine (XYZAL) 5 MG tablet Take 5 mg by mouth at bedtime.     . methocarbamol (ROBAXIN) 500 MG tablet Take 1 tablet (500 mg total) by mouth every 6 (six) hours as needed for muscle spasms. 80 tablet 0  . Potassium 99 MG TABS Take 99 mg by mouth daily.     . propranolol ER (INDERAL LA) 80 MG 24 hr capsule Take 80 mg by mouth daily.    . ranitidine (ZANTAC) 150 MG capsule Take 1 capsule (150 mg total) by mouth daily. (Patient taking differently: Take 150 mg by mouth daily as needed for heartburn. ) 30 capsule 0  . RESTASIS 0.05 % ophthalmic emulsion Place 1 drop into both eyes daily.     . rivaroxaban (XARELTO) 20 MG TABS tablet Take 1 tablet (20 mg total) by mouth daily with supper. May resume dosing at home following discharge starting Thursday 09/01/2017 30 tablet 0  . tetrahydrozoline-zinc (VISINE-AC) 0.05-0.25 % ophthalmic solution Place 2 drops into both eyes 3 (three) times daily as needed (for allergy eyes/dry eye).    Marland Kitchen triamterene-hydrochlorothiazide (MAXZIDE-25) 37.5-25 MG per tablet Take 1 tablet  by mouth every morning.     . venlafaxine XR (EFFEXOR XR) 75 MG 24 hr capsule Take 1 capsule (75 mg total) by mouth daily with breakfast. 90 capsule 2  . Guaifenesin 1200 MG TB12 Take 1,200 mg by mouth daily as needed (congestion).    Marland Kitchen HYDROmorphone (DILAUDID) 2 MG tablet Take 1-2 tablets (2-4 mg total) by mouth every 4 (four) hours as needed for moderate pain or severe pain. (Patient not taking: Reported on 10/03/2017) 80 tablet 0  . Phenazopyridine HCl (AZO TABS PO) Take 1 tablet by mouth daily as needed (buring/pain).    . traMADol (ULTRAM) 50 MG tablet Take 50 mg by mouth daily as needed for moderate pain.     No current facility-administered medications for this encounter.     Physical Findings: The patient is in no acute  distress. Patient is alert and oriented.  weight is 140 lb 6 oz (63.7 kg). Her oral temperature is 98.5 F (36.9 C). Her blood pressure is 143/97 (abnormal) and her pulse is 72. Her respiration is 18 and oxygen saturation is 98%.    Lungs are clear to auscultation bilaterally. Heart has regular rate and rhythm. No palpable cervical, supraclavicular, or axillary adenopathy. Abdomen soft, non-tender, normal bowel sounds.  Breast Exam  Right Breast: Tissue expander in place, some deformity noted. No tenderness with palpation. She has erythema inferior to the right chest wall extending into the right upper abdomen. She continues to have this rash in the same location that has not improved with topical steroids. Left Breast: No palpable mass or nipple discharge.   Lab Findings: Lab Results  Component Value Date   WBC 12.1 (H) 08/31/2017   HGB 11.1 (L) 08/31/2017   HCT 35.0 (L) 08/31/2017   MCV 103.9 (H) 08/31/2017   PLT 280 08/31/2017    Radiographic Findings: No results found.  Impression:  Invasive lobular carcinoma, pT3, pN0, pMX. Patient has cellulitis along the right lower chest and upper abdominal area. Patient will be placed on doxycycline for 7-10 days.  Plan:  Follow up in 2 weeks.  ____________________________________  Blair Promise, PhD, MD  This document serves as a record of services personally performed by Gery Pray, MD. It was created on his behalf by Rae Lips, a trained medical scribe. The creation of this record is based on the scribe's personal observations and the provider's statements to them. This document has been checked and approved by the attending provider.

## 2017-10-19 ENCOUNTER — Other Ambulatory Visit: Payer: Self-pay | Admitting: Radiation Oncology

## 2017-10-19 ENCOUNTER — Telehealth: Payer: Self-pay

## 2017-10-19 MED ORDER — CEPHALEXIN 500 MG PO CAPS: 500.0000 mg | ORAL_CAPSULE | Freq: Two times a day (BID) | ORAL | 0 refills | Status: DC

## 2017-10-19 NOTE — Telephone Encounter (Signed)
Returned pt's call regarding inability to tolerate Doxycycline medication. Informed her that Dr. Sondra Come had called in a new prescription for Keflex to the [facility] on file. Instructed pt to discard Doxycycline prescription and start Keflex as soon as it's filled, and to take to completion. Advised pt to please call back should she have any other symptoms issues. Pt verbalized understanding and agreement.

## 2017-11-03 ENCOUNTER — Other Ambulatory Visit: Payer: Self-pay | Admitting: Oncology

## 2017-11-03 ENCOUNTER — Encounter: Payer: Self-pay | Admitting: Radiation Oncology

## 2017-11-03 ENCOUNTER — Ambulatory Visit
Admission: RE | Admit: 2017-11-03 | Discharge: 2017-11-03 | Disposition: A | Discharge status: Home / Self Care | Payer: Medicare Other | Source: Ambulatory Visit | Attending: Radiation Oncology | Admitting: Radiation Oncology

## 2017-11-03 VITALS — BP 143/93 | HR 82 | Temp 98.4°F | Resp 18 | Wt 141.8 lb

## 2017-11-03 DIAGNOSIS — Z170 Estrogen receptor positive status [ER+]: Secondary | ICD-10-CM | POA: Insufficient documentation

## 2017-11-03 DIAGNOSIS — Z79811 Long term (current) use of aromatase inhibitors: Secondary | ICD-10-CM | POA: Diagnosis not present

## 2017-11-03 DIAGNOSIS — C50211 Malignant neoplasm of upper-inner quadrant of right female breast: Secondary | ICD-10-CM | POA: Diagnosis present

## 2017-11-03 DIAGNOSIS — Z79899 Other long term (current) drug therapy: Secondary | ICD-10-CM | POA: Insufficient documentation

## 2017-11-03 DIAGNOSIS — Z7901 Long term (current) use of anticoagulants: Secondary | ICD-10-CM | POA: Insufficient documentation

## 2017-11-03 NOTE — Progress Notes (Signed)
Radiation Oncology         [phone] ________________________________  Name: [name] MRN: [id number]  Date: 11/03/2017  DOB: 18-Nov-1940  Follow-Up Visit Note  CC: [name], MD  Excell Seltzer, MD    ICD-10-CM   1. Malignant neoplasm of upper-inner quadrant of right breast in female, estrogen receptor positive ([place]) C50.211    Z17.0     Diagnosis:   77 y.o. female with Invasive lobular carcinoma, pT3, pN0, pMX  Interval Since Last Radiation:  6 months  Radiation treatment dates:03/23/2017-05/04/2017 Site/dose: 1. CW_Rt / 50.4 Gy in 28 fractions  2. Axilla Rt / 45 Gy in 25 fractions  Narrative:  The patient returns today for close follow-up. She states that her skin still has some redness. She denies any itching or discomfort. She was placed on doxycycline at her last visit for cellulitis along the right lower chest and upper abdominal area, but she experienced vomiting after taking two doses of the doxycycline. She was then given a prescription for Keflex which she tolerated well. She also reports urinary urgency and mild dysuria that she is taking AZO for. She denies hematuria. She states that she met with Dr. Iran Planas recently for consideration of right breast reconstruction.                  ALLERGIES:  is allergic to chlorhexidine gluconate; doxycycline; nsaids; tylenol [acetaminophen]; and codeine.  Meds: Current Outpatient Medications  Medication Sig Dispense Refill  . albuterol (PROVENTIL HFA;VENTOLIN HFA) 108 (90 Base) MCG/ACT inhaler Inhale 2 puffs into the lungs every 6 (six) hours as needed. For wheezing/shortness of breath    . ALPRAZolam (XANAX) 0.5 MG tablet Take 0.5 mg by mouth daily as needed for anxiety.     . bisacodyl (DULCOLAX) 5 MG EC tablet Take 5 mg by mouth daily as needed for moderate constipation.    Marland Kitchen BREO ELLIPTA 100-25 MCG/INH AEPB Inhale 1 puff into the lungs daily.    . cephALEXin (KEFLEX) 500 MG capsule Take 1 capsule  (500 mg total) by mouth 2 (two) times daily. 14 capsule 0  . clobetasol cream (TEMOVATE) 5.17 % Apply 1 application topically daily as needed (eczema).    Marland Kitchen desonide (DESOWEN) 0.05 % cream Apply 1 application topically 2 (two) times daily as needed (for skin irritation.).    Marland Kitchen diclofenac sodium (VOLTAREN) 1 % GEL diclofenac 1 % topical gel    . EPINEPHrine (EPIPEN 2-PAK) 0.3 mg/0.3 mL IJ SOAJ injection Inject 0.3 mg into the muscle once.    . Guaifenesin 1200 MG TB12 Take 1,200 mg by mouth daily as needed (congestion).    Marland Kitchen HYDROmorphone (DILAUDID) 2 MG tablet Take 1-2 tablets (2-4 mg total) by mouth every 4 (four) hours as needed for moderate pain or severe pain. 80 tablet 0  . letrozole (FEMARA) 2.5 MG tablet TAKE 1 TABLET BY MOUTH EVERY DAY 30 tablet 2  . levocetirizine (XYZAL) 5 MG tablet Take 5 mg by mouth at bedtime.     . methocarbamol (ROBAXIN) 500 MG tablet Take 1 tablet (500 mg total) by mouth every 6 (six) hours as needed for muscle spasms. 80 tablet 0  . Phenazopyridine HCl (AZO TABS PO) Take 1 tablet by mouth daily as needed (buring/pain).    . Potassium 99 MG TABS Take 99 mg by mouth daily.     . propranolol ER (INDERAL LA) 80 MG 24 hr capsule Take 80 mg by mouth daily.    . ranitidine (ZANTAC)  150 MG capsule Take 1 capsule (150 mg total) by mouth daily. (Patient taking differently: Take 150 mg by mouth daily as needed for heartburn. ) 30 capsule 0  . RESTASIS 0.05 % ophthalmic emulsion Place 1 drop into both eyes daily.     . rivaroxaban (XARELTO) 20 MG TABS tablet Take 1 tablet (20 mg total) by mouth daily with supper. May resume dosing at home following discharge starting Thursday 09/01/2017 30 tablet 0  . tetrahydrozoline-zinc (VISINE-AC) 0.05-0.25 % ophthalmic solution Place 2 drops into both eyes 3 (three) times daily as needed (for allergy eyes/dry eye).    . traMADol (ULTRAM) 50 MG tablet Take 50 mg by mouth daily as needed for moderate pain.    Marland Kitchen  triamterene-hydrochlorothiazide (MAXZIDE-25) 37.5-25 MG per tablet Take 1 tablet by mouth every morning.     . venlafaxine XR (EFFEXOR XR) 75 MG 24 hr capsule Take 1 capsule (75 mg total) by mouth daily with breakfast. 90 capsule 2   No current facility-administered medications for this encounter.     Physical Findings: The patient is in no acute distress. Patient is alert and oriented.  weight is 141 lb 12.8 oz (64.3 kg). Her oral temperature is 98.4 F (36.9 C). Her blood pressure is 143/93 (abnormal) and her pulse is 82. Her respiration is 18 and oxygen saturation is 96%.  Lungs are clear to auscultation bilaterally. Heart has regular rate and rhythm. No palpable cervical, supraclavicular, or axillary adenopathy. Abdomen soft, non-tender, normal bowel sounds.  Left Breast: No palpable mass or nipple discharge. Right Chest: Patient seems to have less erythema to the upper abdominal area and right chest region. No signs of infection at this time. Some radiation changes noted along the right chest and upper abdominal area.   Lab Findings: Lab Results  Component Value Date   WBC 12.1 (H) 08/31/2017   HGB 11.1 (L) 08/31/2017   HCT 35.0 (L) 08/31/2017   MCV 103.9 (H) 08/31/2017   PLT 280 08/31/2017    Radiographic Findings: No results found.  Impression:  No evidence of recurrence on clinical exam. No evidence of cellulitis  Plan:  Patient has met with her plastic surgeon and will be undergoing right breast reconstruction in the next several weeks. Continue routine follow-up with Dr. Jana Hakim in medical oncology. PRN follow-up in radiation oncology.  Patient was encouraged to call or return if she has any additional issues or concerns.  She will continue on Femara. ____________________________________  Blair Promise, PhD, MD  This document serves as a record of services personally performed by Gery Pray, MD. It was created on his behalf by Rae Lips, a trained medical  scribe. The creation of this record is based on the scribe's personal observations and the provider's statements to them. This document has been checked and approved by the attending provider.

## 2017-11-03 NOTE — Progress Notes (Signed)
Pt here today for a follow up for breast cancer. Pt denies having pain. Pt denies having any fatigue. Pt states that she has some redness. Pt states that she has mild dysuria and states she is taking AZO. Pt states that she has urgency. Pt denies having any blood in her urine.   BP (!) 143/93   Pulse 82   Temp 98.4 F (36.9 C) (Oral)   Resp 18   Wt 141 lb 12.8 oz (64.3 kg)   SpO2 96%   BMI 27.24 kg/m    Wt Readings from Last 3 Encounters:  11/03/17 141 lb 12.8 oz (64.3 kg)  10/17/17 140 lb 6 oz (63.7 kg)  10/03/17 144 lb 4 oz (65.4 kg)

## 2017-11-25 ENCOUNTER — Other Ambulatory Visit: Payer: Self-pay | Admitting: Oncology

## 2017-11-25 DIAGNOSIS — C50211 Malignant neoplasm of upper-inner quadrant of right female breast: Secondary | ICD-10-CM

## 2017-11-25 DIAGNOSIS — Z170 Estrogen receptor positive status [ER+]: Principal | ICD-10-CM

## 2017-12-07 ENCOUNTER — Ambulatory Visit: Payer: Medicare Other

## 2017-12-19 ENCOUNTER — Other Ambulatory Visit (HOSPITAL_COMMUNITY): Payer: Medicare Other

## 2017-12-25 ENCOUNTER — Other Ambulatory Visit: Payer: Self-pay | Admitting: Oncology

## 2017-12-26 ENCOUNTER — Ambulatory Visit
Admission: RE | Admit: 2017-12-26 | Discharge: 2017-12-26 | Disposition: A | Discharge status: Home / Self Care | Payer: Medicare Other | Source: Ambulatory Visit | Attending: Oncology | Admitting: Oncology

## 2017-12-26 ENCOUNTER — Inpatient Hospital Stay: Admit: 2017-12-26 | Payer: Medicare Other | Admitting: Plastic Surgery

## 2017-12-26 DIAGNOSIS — C50211 Malignant neoplasm of upper-inner quadrant of right female breast: Secondary | ICD-10-CM

## 2017-12-26 DIAGNOSIS — Z170 Estrogen receptor positive status [ER+]: Principal | ICD-10-CM

## 2017-12-26 SURGERY — REMOVAL, TISSUE EXPANDER, BREAST, WITH IMPLANT INSERTION
Anesthesia: General | Site: Breast | Laterality: Right

## 2018-01-16 ENCOUNTER — Other Ambulatory Visit: Payer: Self-pay | Admitting: *Deleted

## 2018-01-16 DIAGNOSIS — Z170 Estrogen receptor positive status [ER+]: Principal | ICD-10-CM

## 2018-01-16 DIAGNOSIS — C50211 Malignant neoplasm of upper-inner quadrant of right female breast: Secondary | ICD-10-CM

## 2018-01-16 NOTE — Progress Notes (Signed)
[place]  [phone] [phone]     ID: [name] DOB: [birth year]  MR#: [id number]  JQZ#:[id number]  Patient Care Team: Gabriella Ruff, MD as PCP - General (Family Medicine) Gabriella Seltzer, MD as Consulting Physician (General Surgery) Magrinat, Virgie Dad, MD as Consulting Physician (Oncology) Gabriella Pray, MD as Consulting Physician (Radiation Oncology) Gabriella Gibbs, MD (Radiology) Gabriella Arabian, MD as Consulting Physician (Orthopedic Surgery) [name], Gabriella Grinder, MD as Consulting Physician (Allergy and Immunology) Gabriella Irani, MD (Inactive) as Consulting Physician (Gastroenterology) Gabriella Limbo, MD as Consulting Physician (Plastic Surgery) OTHER MD:  CHIEF COMPLAINT: Estrogen receptor positive breast cancer  CURRENT TREATMENT:  letrozole   BREAST CANCER HISTORY: From the original intake note:  The patient has had concerns in the right breast dating back to about 3 years when her primary care physician Dr. Drema Miller noted a change. The breast has been followed very closely but no diagnosis was established. She was followed with annual diagnostic mammography and on 11/11/2016 bilateral diagnostic mammography with tomography at [facility] found the breast density to be category C. There was a new 3.5 cm irregular spiculated mass in the right breast upper inner quadrant. Ultrasound was obtained the same day and confirmed a 3.5 cm solid mass in the right breast upper inner quadrant, with no abnormalities noted in the right axilla.  Biopsy of the right breast mass in question 11/15/2013 showed (SAA [id number] invasive lobular carcinoma, E-cadherin negative, grade 1 or 2, estrogen receptor 90% positive, progesterone receptor 2% positive, both with strong staining intensity, with an MIB-1 of 5, and no HER-2 amplification, the signals ratio being 1.14 and the number per cell 1.20.  Her subsequent history is as detailed  below  INTERVAL HISTORY: [name] returns today for follow-up and treatment of her estrogen receptor positive breast cancer, accompanied by her husband. She continues on letrozole, with good tolerance. She has occasional night sweats that may wake her up. She denies issues with vaginal dryness.   Since her last visit, she underwent routine screening unilateral left mammography with CAD and tomography on 12/26/2017 at [place] showing: breast density category C. There was no evidence of malignancy.    REVIEW OF SYSTEMS: [name] reports that she had left knee surgery and she completed knee physical therapy. [facility] [employer] and exercising with a trainer twice per week. She denies unusual headaches, visual changes, nausea, vomiting, or dizziness. There has been no unusual cough, phlegm production, or pleurisy. There has been no change in bowel or bladder habits. She denies unexplained fatigue or unexplained weight loss, bleeding, rash, or fever. A detailed review of systems was otherwise stable.    PAST MEDICAL HISTORY: Past Medical History:  Diagnosis Date  . Anemia    history of  . Anxiety   . Arthritis   . Asthma   . Breast cancer ([place])   . Cancer ([place])    skin cancers, one melanoma   . Complication of anesthesia    patient woke up during colonoscopy  . Concussion    8 yrs. ago due to being thrown from a horse  . Dislocated elbow 10/12/2016   left  . Dysrhythmia 12/2016   A-fib  . Family history of breast cancer   . History of radiation therapy 03/23/17-05/04/17   right chest wall 50.4 Gy in 28 fractions, right axilla 45 Gy in 25 fractions  . Hypertension   . Hypertensive  heart disease without CHF   . Kidney stones    hx of   . Melanoma ([place])   . Persistent atrial fibrillation ([place]) 12/16/2016   CHA2DS2VASC score 3  . Pneumonia    hx of     PAST SURGICAL HISTORY: Past Surgical History:  Procedure Laterality Date  .  brachial skin removal due to deforimity Bilateral   . BREAST RECONSTRUCTION WITH PLACEMENT OF TISSUE EXPANDER AND FLEX HD (ACELLULAR HYDRATED DERMIS) Right 01/28/2017   Procedure: RIGHT BREAST RECONSTRUCTION WITH PLACEMENT OF TISSUE EXPANDER AND ALLODERM;  Surgeon: Gabriella Limbo, MD;  Location: [facility];  Service: Plastics;  Laterality: Right;  . BUNIONECTOMY     left   . CATARACT EXTRACTION     bilaterla cataract surgery   . COLONOSCOPY W/ POLYPECTOMY    . KNEE ARTHROSCOPY     left   . MASTECTOMY W/ SENTINEL NODE BIOPSY Right 01/28/2017   Procedure: RIGHT TOTAL MASTECTOMY WITH SENTINEL LYMPH NODE BIOPSY;  Surgeon: Gabriella Seltzer, MD;  Location: [place];  Service: General;  Laterality: Right;  . MELANOMA EXCISION Left   . polyp removed from uterus     . right wrist pinning     . skin cancers removed    . TOTAL KNEE ARTHROPLASTY Right 01/29/2013   Procedure: RIGHT TOTAL KNEE ARTHROPLASTY;  Surgeon: Gearlean Alf, MD;  Location: [facility];  Service: Orthopedics;  Laterality: Right;  . TOTAL KNEE ARTHROPLASTY Left 08/29/2017   Procedure: LEFT TOTAL KNEE ARTHROPLASTY;  Surgeon: Gabriella Arabian, MD;  Location: [facility];  Service: Orthopedics;  Laterality: Left;    FAMILY HISTORY Family History  Problem Relation Age of Onset  . COPD Father   . Breast cancer Other        dx in her 63s-30s  The patient's father died at the age of 54 from complications of emphysema and alcohol abuse. The patient's mother died from heart disease at age 32. The patient was an only child. The only relative with breast cancer was a maternal great aunt was diagnosed in her 75s. There is no history of ovarian cancer in the family.  GYNECOLOGIC HISTORY:  No LMP recorded. Patient is postmenopausal. Menarche age 63, first live birth age 69, she is [name]. She stopped having periods in 1994. She was using intravaginal Premarin until her breast cancer diagnosis June 2018. She used oral contraceptives for approximately one year  remotely with no complications.  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 Her son from her first marriage, [name], lives in [place] and is an Sales promotion account executive. He has 2 children. The patient's second husband, [name], is a retired Film/video editor. He has a son in [place], who works as a Marine scientist. [name] has 5 grandchildren of his own. The patient is a Psychologist, forensic.    ADVANCED DIRECTIVES: In place. The patient tells me she has named her husband [name] and her son [name] as healthcare part of attorney   HEALTH MAINTENANCE: Social History   Tobacco Use  . Smoking status: Never Smoker  . Smokeless tobacco: Never Used  Substance Use Topics  . Alcohol use: Yes    Comment: occ  . Drug use: No     Colonoscopy:2015  PAP:  Bone density: 01/27/2015 at [facility] T score -0.3   Allergies  Allergen Reactions  . Chlorhexidine Gluconate Hives, Swelling and Rash    SWELLING REACTION UNSPECIFIED    "ChloraPrep "  . Doxycycline Nausea And Vomiting  . Nsaids  avoid due to gerd  . Tylenol [Acetaminophen]     Avoid due to GERD  . Codeine Nausea Only    Current Outpatient Medications  Medication Sig Dispense Refill  . albuterol (PROVENTIL HFA;VENTOLIN HFA) 108 (90 Base) MCG/ACT inhaler Inhale 2 puffs into the lungs every 6 (six) hours as needed. For wheezing/shortness of breath    . ALPRAZolam (XANAX) 0.5 MG tablet Take 0.5 mg by mouth daily as needed for anxiety.     . bisacodyl (DULCOLAX) 5 MG EC tablet Take 5 mg by mouth daily as needed for moderate constipation.    Marland Kitchen BREO ELLIPTA 100-25 MCG/INH AEPB Inhale 1 puff into the lungs daily.    . cephALEXin (KEFLEX) 500 MG capsule Take 1 capsule (500 mg total) by mouth 2 (two) times daily. 14 capsule 0  . clobetasol cream (TEMOVATE) 6.94 % Apply 1 application topically daily as needed (eczema).    Marland Kitchen desonide (DESOWEN) 0.05 % cream Apply 1 application topically 2 (two) times daily as needed (for skin irritation.).     Marland Kitchen diclofenac sodium (VOLTAREN) 1 % GEL diclofenac 1 % topical gel    . EPINEPHrine (EPIPEN 2-PAK) 0.3 mg/0.3 mL IJ SOAJ injection Inject 0.3 mg into the muscle once.    . Guaifenesin 1200 MG TB12 Take 1,200 mg by mouth daily as needed (congestion).    Marland Kitchen HYDROmorphone (DILAUDID) 2 MG tablet Take 1-2 tablets (2-4 mg total) by mouth every 4 (four) hours as needed for moderate pain or severe pain. 80 tablet 0  . letrozole (FEMARA) 2.5 MG tablet TAKE 1 TABLET BY MOUTH EVERY DAY 30 tablet 2  . levocetirizine (XYZAL) 5 MG tablet Take 5 mg by mouth at bedtime.     . methocarbamol (ROBAXIN) 500 MG tablet Take 1 tablet (500 mg total) by mouth every 6 (six) hours as needed for muscle spasms. 80 tablet 0  . Phenazopyridine HCl (AZO TABS PO) Take 1 tablet by mouth daily as needed (buring/pain).    . Potassium 99 MG TABS Take 99 mg by mouth daily.     . propranolol ER (INDERAL LA) 80 MG 24 hr capsule Take 80 mg by mouth daily.    . ranitidine (ZANTAC) 150 MG capsule Take 1 capsule (150 mg total) by mouth daily. (Patient taking differently: Take 150 mg by mouth daily as needed for heartburn. ) 30 capsule 0  . RESTASIS 0.05 % ophthalmic emulsion Place 1 drop into both eyes daily.     . rivaroxaban (XARELTO) 20 MG TABS tablet Take 1 tablet (20 mg total) by mouth daily with supper. May resume dosing at home following discharge starting Thursday 09/01/2017 30 tablet 0  . tetrahydrozoline-zinc (VISINE-AC) 0.05-0.25 % ophthalmic solution Place 2 drops into both eyes 3 (three) times daily as needed (for allergy eyes/dry eye).    . traMADol (ULTRAM) 50 MG tablet Take 50 mg by mouth daily as needed for moderate pain.    Marland Kitchen triamterene-hydrochlorothiazide (MAXZIDE-25) 37.5-25 MG per tablet Take 1 tablet by mouth every morning.     . venlafaxine XR (EFFEXOR-XR) 75 MG 24 hr capsule TAKE 1 CAPSULE (75 MG TOTAL) BY MOUTH DAILY WITH BREAKFAST. 90 capsule 2   No current facility-administered medications for this visit.      OBJECTIVE: Middle-aged white woman in no acute distress  Vitals:   01/17/18 1357  BP: 125/80  Pulse: 69  Resp: 18  Temp: 98.2 F (36.8 C)  SpO2: 98%     Body mass index is 27.28  kg/m.   Filed Weights   01/17/18 1357  Weight: 142 lb (64.4 kg)       ECOG FS:1 - Symptomatic but completely ambulatory  Sclerae unicteric, pupils round and equal No cervical or supraclavicular adenopathy Lungs no rales or rhonchi Heart regular rate and rhythm Abd soft, nontender, positive bowel sounds MSK no focal spinal tenderness, no upper extremity lymphedema Neuro: nonfocal, well oriented, appropriate affect Breasts: Right breast is status post mastectomy.  It is awaiting definitive reconstruction, scheduled for October.  The left breast is benign.  Both axillae are benign.  LAB RESULTS:  CMP     Component Value Date/Time   NA 136 08/31/2017 0558   NA 134 (L) 04/25/2017 1216   K 4.4 08/31/2017 0558   K 4.3 04/25/2017 1216   CL 97 (L) 08/31/2017 0558   CO2 29 08/31/2017 0558   CO2 29 04/25/2017 1216   GLUCOSE 109 (H) 08/31/2017 0558   GLUCOSE 85 04/25/2017 1216   BUN 12 08/31/2017 0558   BUN 14.4 04/25/2017 1216   CREATININE 0.69 08/31/2017 0558   CREATININE 0.79 07/25/2017 1408   CREATININE 0.7 04/25/2017 1216   CALCIUM 9.6 08/31/2017 0558   CALCIUM 10.0 04/25/2017 1216   PROT 7.1 08/25/2017 1509   PROT 7.2 04/25/2017 1216   ALBUMIN 3.7 08/25/2017 1509   ALBUMIN 3.7 04/25/2017 1216   AST 21 08/25/2017 1509   AST 21 07/25/2017 1408   AST 21 04/25/2017 1216   ALT 11 (L) 08/25/2017 1509   ALT 7 07/25/2017 1408   ALT 10 04/25/2017 1216   ALKPHOS 74 08/25/2017 1509   ALKPHOS 75 04/25/2017 1216   BILITOT 0.5 08/25/2017 1509   BILITOT 0.6 07/25/2017 1408   BILITOT 0.65 04/25/2017 1216   GFRNONAA >60 08/31/2017 0558   GFRNONAA >60 07/25/2017 1408   GFRAA >60 08/31/2017 0558   GFRAA >60 07/25/2017 1408    No results found for: Ronnald Ramp, A1GS, A2GS, BETS,  BETA2SER, GAMS, MSPIKE, SPEI  No results found for: Nils Pyle, [facility]  Lab Results  Component Value Date   WBC 6.5 01/17/2018   NEUTROABS 4.3 01/17/2018   HGB 12.8 01/17/2018   HCT 37.9 01/17/2018   MCV 96.4 01/17/2018   PLT 349 01/17/2018      Chemistry      Component Value Date/Time   NA 136 08/31/2017 0558   NA 134 (L) 04/25/2017 1216   K 4.4 08/31/2017 0558   K 4.3 04/25/2017 1216   CL 97 (L) 08/31/2017 0558   CO2 29 08/31/2017 0558   CO2 29 04/25/2017 1216   BUN 12 08/31/2017 0558   BUN 14.4 04/25/2017 1216   CREATININE 0.69 08/31/2017 0558   CREATININE 0.79 07/25/2017 1408   CREATININE 0.7 04/25/2017 1216      Component Value Date/Time   CALCIUM 9.6 08/31/2017 0558   CALCIUM 10.0 04/25/2017 1216   ALKPHOS 74 08/25/2017 1509   ALKPHOS 75 04/25/2017 1216   AST 21 08/25/2017 1509   AST 21 07/25/2017 1408   AST 21 04/25/2017 1216   ALT 11 (L) 08/25/2017 1509   ALT 7 07/25/2017 1408   ALT 10 04/25/2017 1216   BILITOT 0.5 08/25/2017 1509   BILITOT 0.6 07/25/2017 1408   BILITOT 0.65 04/25/2017 1216       No results found for: LABCA2  No components found for: [id number]  No results for input(s): INR in the last 168 hours.  Urinalysis    Component Value Date/Time   COLORURINE  YELLOW 01/22/2013 1345   APPEARANCEUR CLEAR 01/22/2013 1345   LABSPEC 1.011 01/22/2013 1345   PHURINE 6.5 01/22/2013 1345   GLUCOSEU NEGATIVE 01/22/2013 1345   HGBUR TRACE (A) 01/22/2013 1345   BILIRUBINUR NEGATIVE 01/22/2013 1345   KETONESUR NEGATIVE 01/22/2013 1345   PROTEINUR NEGATIVE 01/22/2013 1345   UROBILINOGEN 0.2 01/22/2013 1345   NITRITE NEGATIVE 01/22/2013 1345   LEUKOCYTESUR NEGATIVE 01/22/2013 1345     STUDIES: Mm 3d Screen Breast Uni Left  Result Date: 12/27/2017 CLINICAL DATA:  Screening. EXAM: DIGITAL SCREENING UNILATERAL LEFT MAMMOGRAM WITH CAD AND TOMO COMPARISON:  Previous exam(s). ACR Breast Density Category c: The breast tissue is  heterogeneously dense, which may obscure small masses. FINDINGS: The patient has had a right mastectomy. There are no findings suspicious for malignancy. Images were processed with CAD. IMPRESSION: No mammographic evidence of malignancy. A result letter of this screening mammogram will be mailed directly to the patient. RECOMMENDATION: Screening mammogram in one year.  (Code:[id number]-L-66M) BI-RADS CATEGORY  1: Negative. Electronically Signed   By: Margarette Canada M.D.   On: 12/27/2017 14:09     ELIGIBLE FOR AVAILABLE RESEARCH PROTOCOL: no  ASSESSMENT: 77 y.o. [place] woman status post right breast upper outer quadrant biopsy 11/15/2016 for a clinical T2 N0, stage IB invasive lobular carcinoma, grade 1 or 2, estrogen and progesterone receptor positive, HER-2 nonamplified, with an MIB-1 of 5%.  (1) genetics testing 12/02/2016 through the hereditary Gene Panel offered by [facility] found no deleterious mutations in APC, ATM, AXIN2, BARD1, BMPR1A, BRCA1, BRCA2, BRIP1, CDH1, CDKN2A (p14ARF), CDKN2A (p16INK4a), CHEK2, CTNNA1, DICER1, EPCAM (Deletion/duplication testing only), GREM1 (promoter region deletion/duplication testing only), KIT, MEN1, MLH1, MSH2, MSH3, MSH6, MUTYH, NBN, NF1, NHTL1, PALB2, PDGFRA, PMS2, POLD1, POLE, PTEN, RAD50, RAD51C, RAD51D, SDHB, SDHC, SDHD, SMAD4, SMARCA4. STK11, TP53, TSC1, TSC2, and VHL.  The following genes were evaluated for sequence changes only: SDHA and HOXB13 c.251G>A variant only.    (2) anastrozole started 11/24/2016  (3) Status post right mastectomy with sentinel lymph node sampling 01/28/2017 for apT3 pN0, invasive lobular carcinoma, grade 1, with negative margins.   (4) Oncotype DX score of 21 predicts a 10 year risk of recurrence outside the breast of 14% if the patient's only systemic therapy is tamoxifen for 5 years. It also predicts no significant benefit from chemotherapy.  (5) adjuvant radiation completed 05/04/2017 Site/dose: 1. CW_Rt/50.4 Gy in 28 fractions    2. Axilla Rt/45 Gy in 25 fractions  (6) continue letrozole, 7 year treatment plan as for lobular tumors  (a) bone density August 2016 was normal  (b) [facility] at [facility] 11/22/2016 showed a T score of 0.0 (normal    PLAN: [name] is now 1 year out from definitive surgery for her breast cancer with no evidence of disease recurrence.  This is favorable.  She is tolerating letrozole well and the plan will be to continue that for a total of 7 years.  I am glad her husband survived his car accident and is recovering well.  Today I wrote her a prescription for additional bras.  She will have her final reconstruction in October.  She will see me again a year from now.  She will have a repeat bone density with her next mammogram  She knows to call for any other issues that may develop before the next visit.    Magrinat, Virgie Dad, MD  01/17/18 2:16 PM Medical Oncology and Hematology [facility] [address] [place], [place] [address] Tel. [phone]  Fax. [phone]  I, Sheron Nightingale, am acting as scribe for Chauncey Cruel MD.  I, Lurline Del MD, have reviewed the above documentation for accuracy and completeness, and I agree with the above.

## 2018-01-17 ENCOUNTER — Telehealth: Payer: Self-pay | Admitting: Oncology

## 2018-01-17 ENCOUNTER — Inpatient Hospital Stay: Payer: Medicare Other

## 2018-01-17 ENCOUNTER — Inpatient Hospital Stay: Payer: Medicare Other | Attending: Oncology | Admitting: Oncology

## 2018-01-17 VITALS — BP 125/80 | HR 69 | Temp 98.2°F | Resp 18 | Ht 60.5 in | Wt 142.0 lb

## 2018-01-17 DIAGNOSIS — Z85820 Personal history of malignant melanoma of skin: Secondary | ICD-10-CM | POA: Diagnosis not present

## 2018-01-17 DIAGNOSIS — F419 Anxiety disorder, unspecified: Secondary | ICD-10-CM | POA: Insufficient documentation

## 2018-01-17 DIAGNOSIS — C50211 Malignant neoplasm of upper-inner quadrant of right female breast: Secondary | ICD-10-CM

## 2018-01-17 DIAGNOSIS — Z803 Family history of malignant neoplasm of breast: Secondary | ICD-10-CM | POA: Diagnosis not present

## 2018-01-17 DIAGNOSIS — I481 Persistent atrial fibrillation: Secondary | ICD-10-CM | POA: Diagnosis not present

## 2018-01-17 DIAGNOSIS — Z79811 Long term (current) use of aromatase inhibitors: Secondary | ICD-10-CM | POA: Insufficient documentation

## 2018-01-17 DIAGNOSIS — Z170 Estrogen receptor positive status [ER+]: Secondary | ICD-10-CM | POA: Diagnosis not present

## 2018-01-17 DIAGNOSIS — Z79899 Other long term (current) drug therapy: Secondary | ICD-10-CM | POA: Insufficient documentation

## 2018-01-17 DIAGNOSIS — Z7901 Long term (current) use of anticoagulants: Secondary | ICD-10-CM | POA: Diagnosis not present

## 2018-01-17 DIAGNOSIS — Z923 Personal history of irradiation: Secondary | ICD-10-CM | POA: Insufficient documentation

## 2018-01-17 LAB — CBC WITH DIFFERENTIAL (CANCER CENTER ONLY)
Basophils Absolute: 0.1 10*3/uL (ref 0.0–0.1)
Basophils Relative: 1 %
Eosinophils Absolute: 0.2 10*3/uL (ref 0.0–0.5)
Eosinophils Relative: 3 %
HEMATOCRIT: 37.9 % (ref 34.8–46.6)
HEMOGLOBIN: 12.8 g/dL (ref 11.6–15.9)
LYMPHS ABS: 1.3 10*3/uL (ref 0.9–3.3)
Lymphocytes Relative: 19 %
MCH: 32.5 pg (ref 25.1–34.0)
MCHC: 33.7 g/dL (ref 31.5–36.0)
MCV: 96.4 fL (ref 79.5–101.0)
Monocytes Absolute: 0.7 10*3/uL (ref 0.1–0.9)
Monocytes Relative: 12 %
NEUTROS ABS: 4.3 10*3/uL (ref 1.5–6.5)
NEUTROS PCT: 65 %
Platelet Count: 349 10*3/uL (ref 145–400)
RBC: 3.93 MIL/uL (ref 3.70–5.45)
RDW: 14.1 % (ref 11.2–14.5)
WBC: 6.5 10*3/uL (ref 3.9–10.3)

## 2018-01-17 LAB — CMP (CANCER CENTER ONLY)
ALT: 10 U/L (ref 0–44)
AST: 20 U/L (ref 15–41)
Albumin: 3.5 g/dL (ref 3.5–5.0)
Alkaline Phosphatase: 109 U/L (ref 38–126)
Anion gap: 13 (ref 5–15)
BUN: 8 mg/dL (ref 8–23)
CHLORIDE: 97 mmol/L — AB (ref 98–111)
CO2: 26 mmol/L (ref 22–32)
Calcium: 9.6 mg/dL (ref 8.9–10.3)
Creatinine: 0.72 mg/dL (ref 0.44–1.00)
Glucose, Bld: 86 mg/dL (ref 70–99)
Potassium: 3.6 mmol/L (ref 3.5–5.1)
SODIUM: 136 mmol/L (ref 135–145)
Total Bilirubin: 0.5 mg/dL (ref 0.3–1.2)
Total Protein: 7.5 g/dL (ref 6.5–8.1)

## 2018-01-17 MED ORDER — LETROZOLE 2.5 MG PO TABS: 2.5000 mg | ORAL_TABLET | Freq: Every day | ORAL | 4 refills | Status: DC

## 2018-01-17 NOTE — Addendum Note (Signed)
Addended by: Chauncey Cruel on: 01/17/2018 02:19 PM   Modules accepted: Orders

## 2018-01-17 NOTE — Telephone Encounter (Signed)
Gave patient avs and calendar.   °

## 2018-02-26 NOTE — H&P (Signed)
Subjective:     Patient ID: [name] is a 77 y.o. female.  HPI  13 months post op mastectomy with TE/ADM reconstruction. Presents today with 1 day history drainage right chest concerned expander deflated. Notes 2 days ago right axilla swollen this has resolved. No fevers, feeling well overall and celebrated her and husband's birthday this weekend.   Presented following annual diagnostic MMG with a 3.5 cm spiculated mass in the right breast UIQ. [place] confirmed a 3.5 cm solid mass in the right UIQ, with no abnormalities noted in the right axilla. 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 MRI demonstrated known 2.6 cm mass UIQ. Additional right UOQ 0.5 x 0.7 x 0.3 cm mass noted. 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  Oncotype 21/intermediate risk. Completed adjuvant radiation 11.28.18.   Right mastectomy 397 g  MMG 12/2017 normal  Genetics negative.     Objective:   Physical Exam  Cardiovascular: Normal rate, regular rhythm and normal heart sounds.  Pulmonary/Chest: Effort normal and breath sounds normal.  Neurological: She is alert.   Chest: Right chest open wound 1 cm mid chest with exposure TE tab, abdominal soft tissue contracted onto abdomen and expander is not completely flat on chest wall  SN to nipple L 28, grade 3 ptosis BW R 12 L 19 cm Nipple to IMF L 6 cm      Assessment:     R breast ca multicentric A fib- on anticoagulation S/p right mastectomy, TE/ADM (Alloderm) reconstruction Adjuvant radiation Exposure Right TE  Plan:   Hold Xarelto- already took today's dose.   Patient scheduled for LD flap 3 weeks from now. Unfortunately has developed ulceration and exposure TE. Ultimately this is likely related to RT changes to soft tissue and we reviewed this was the purpose of planned LD flap to prevent wound healing issues at exchange.  Given this recommend removal TE and allow soft  tissue to heal. Counseled the cavity and expander is not contaminated and do not recommend proceeding with LD flap urgently. She and husband understand and agree to removal. Plan in am 9.23.19. NPO past MN, take propanolol and Maxzide with sip water, hold all other medications in am.  Natrelle 133-MX-11T 300 ml tissue expander placed,  305 ml total fill volume.    [name], MD [facility] Plastic & Reconstructive Surgery [phone], pin [phone]

## 2018-02-27 ENCOUNTER — Ambulatory Visit (HOSPITAL_COMMUNITY): Payer: Medicare Other | Admitting: Certified Registered Nurse Anesthetist

## 2018-02-27 ENCOUNTER — Encounter (HOSPITAL_COMMUNITY): Payer: Self-pay | Admitting: *Deleted

## 2018-02-27 ENCOUNTER — Encounter (HOSPITAL_COMMUNITY)
Admission: RE | Disposition: A | Discharge status: Home / Self Care | Payer: Self-pay | Source: Ambulatory Visit | Attending: Plastic Surgery

## 2018-02-27 ENCOUNTER — Other Ambulatory Visit: Payer: Self-pay

## 2018-02-27 ENCOUNTER — Ambulatory Visit (HOSPITAL_COMMUNITY)
Admission: RE | Admit: 2018-02-27 | Discharge: 2018-02-27 | Disposition: A | Discharge status: Home / Self Care | Payer: Medicare Other | Source: Ambulatory Visit | Attending: Plastic Surgery | Admitting: Plastic Surgery

## 2018-02-27 DIAGNOSIS — Z7901 Long term (current) use of anticoagulants: Secondary | ICD-10-CM | POA: Diagnosis not present

## 2018-02-27 DIAGNOSIS — Z9011 Acquired absence of right breast and nipple: Secondary | ICD-10-CM | POA: Insufficient documentation

## 2018-02-27 DIAGNOSIS — Z79899 Other long term (current) drug therapy: Secondary | ICD-10-CM | POA: Insufficient documentation

## 2018-02-27 DIAGNOSIS — Z853 Personal history of malignant neoplasm of breast: Secondary | ICD-10-CM | POA: Insufficient documentation

## 2018-02-27 DIAGNOSIS — I4891 Unspecified atrial fibrillation: Secondary | ICD-10-CM | POA: Diagnosis not present

## 2018-02-27 HISTORY — PX: TISSUE EXPANDER PLACEMENT: SHX2530

## 2018-02-27 LAB — CBC
HEMATOCRIT: 41.6 % (ref 36.0–46.0)
HEMOGLOBIN: 13.6 g/dL (ref 12.0–15.0)
MCH: 32.8 pg (ref 26.0–34.0)
MCHC: 32.7 g/dL (ref 30.0–36.0)
MCV: 100.2 fL — ABNORMAL HIGH (ref 78.0–100.0)
Platelets: 262 10*3/uL (ref 150–400)
RBC: 4.15 MIL/uL (ref 3.87–5.11)
RDW: 12.7 % (ref 11.5–15.5)
WBC: 6.9 10*3/uL (ref 4.0–10.5)

## 2018-02-27 LAB — PROTIME-INR
INR: 1.64
Prothrombin Time: 19.3 seconds — ABNORMAL HIGH (ref 11.4–15.2)

## 2018-02-27 LAB — BASIC METABOLIC PANEL
Anion gap: 13 (ref 5–15)
BUN: 11 mg/dL (ref 8–23)
CHLORIDE: 97 mmol/L — AB (ref 98–111)
CO2: 25 mmol/L (ref 22–32)
CREATININE: 0.77 mg/dL (ref 0.44–1.00)
Calcium: 9.3 mg/dL (ref 8.9–10.3)
GFR calc Af Amer: >60 mL/min (ref >60)
GFR calc non Af Amer: >60 mL/min (ref >60)
Glucose, Bld: 100 mg/dL — ABNORMAL HIGH (ref 70–99)
Potassium: 3.5 mmol/L (ref 3.5–5.1)
Sodium: 135 mmol/L (ref 135–145)

## 2018-02-27 SURGERY — INSERTION, TISSUE EXPANDER
Anesthesia: General | Laterality: Right

## 2018-02-27 MED ORDER — CEFAZOLIN SODIUM-DEXTROSE 2-4 GM/100ML-% IV SOLN
2.0000 g | INTRAVENOUS | Status: AC
  Administered 2018-02-27: 2 g via INTRAVENOUS
  Filled 2018-02-27: qty 100

## 2018-02-27 MED ORDER — FENTANYL CITRATE (PF) 100 MCG/2ML IJ SOLN
25.0000 ug | INTRAMUSCULAR | Status: DC | PRN
  Administered 2018-02-27: 50 ug via INTRAVENOUS
  Administered 2018-02-27 (×2): 25 ug via INTRAVENOUS

## 2018-02-27 MED ORDER — FENTANYL CITRATE (PF) 100 MCG/2ML IJ SOLN
INTRAMUSCULAR | Status: AC
  Filled 2018-02-27: qty 2

## 2018-02-27 MED ORDER — ONDANSETRON HCL 4 MG/2ML IJ SOLN
INTRAMUSCULAR | Status: DC | PRN
  Administered 2018-02-27: 4 mg via INTRAVENOUS

## 2018-02-27 MED ORDER — MIDAZOLAM HCL 2 MG/2ML IJ SOLN
INTRAMUSCULAR | Status: AC
  Filled 2018-02-27: qty 2

## 2018-02-27 MED ORDER — OXYCODONE HCL 5 MG PO TABS: 5.0000 mg | ORAL_TABLET | ORAL | 0 refills | Status: DC | PRN

## 2018-02-27 MED ORDER — ONDANSETRON HCL 4 MG/2ML IJ SOLN
INTRAMUSCULAR | Status: AC
  Filled 2018-02-27: qty 2

## 2018-02-27 MED ORDER — DEXAMETHASONE SODIUM PHOSPHATE 10 MG/ML IJ SOLN
INTRAMUSCULAR | Status: DC | PRN
  Administered 2018-02-27: 5 mg via INTRAVENOUS

## 2018-02-27 MED ORDER — LIDOCAINE HCL (CARDIAC) PF 100 MG/5ML IV SOSY
PREFILLED_SYRINGE | INTRAVENOUS | Status: DC | PRN
  Administered 2018-02-27: 40 mg via INTRAVENOUS

## 2018-02-27 MED ORDER — PROPOFOL 10 MG/ML IV BOLUS
INTRAVENOUS | Status: AC
  Filled 2018-02-27: qty 20

## 2018-02-27 MED ORDER — DEXAMETHASONE SODIUM PHOSPHATE 10 MG/ML IJ SOLN
INTRAMUSCULAR | Status: AC
  Filled 2018-02-27: qty 1

## 2018-02-27 MED ORDER — LIDOCAINE 2% (20 MG/ML) 5 ML SYRINGE
INTRAMUSCULAR | Status: AC
  Filled 2018-02-27: qty 5

## 2018-02-27 MED ORDER — SODIUM CHLORIDE 0.9 % IV SOLN
Freq: Once | INTRAVENOUS | Status: DC
  Filled 2018-02-27: qty 1

## 2018-02-27 MED ORDER — LACTATED RINGERS IV SOLN
INTRAVENOUS | Status: DC | PRN
  Administered 2018-02-27 (×2): via INTRAVENOUS

## 2018-02-27 MED ORDER — 0.9 % SODIUM CHLORIDE (POUR BTL) OPTIME
TOPICAL | Status: DC | PRN
  Administered 2018-02-27 (×2): 1000 mL

## 2018-02-27 MED ORDER — FENTANYL CITRATE (PF) 100 MCG/2ML IJ SOLN
INTRAMUSCULAR | Status: DC | PRN
  Administered 2018-02-27: 25 ug via INTRAVENOUS
  Administered 2018-02-27: 50 ug via INTRAVENOUS

## 2018-02-27 MED ORDER — FENTANYL CITRATE (PF) 250 MCG/5ML IJ SOLN
INTRAMUSCULAR | Status: AC
  Filled 2018-02-27: qty 5

## 2018-02-27 MED ORDER — PROPOFOL 10 MG/ML IV BOLUS
INTRAVENOUS | Status: DC | PRN
  Administered 2018-02-27: 150 mg via INTRAVENOUS
  Administered 2018-02-27: 20 mg via INTRAVENOUS

## 2018-02-27 MED ORDER — MIDAZOLAM HCL 2 MG/2ML IJ SOLN
INTRAMUSCULAR | Status: DC | PRN
  Administered 2018-02-27: 1 mg via INTRAVENOUS

## 2018-02-27 MED ORDER — PHENYLEPHRINE 40 MCG/ML (10ML) SYRINGE FOR IV PUSH (FOR BLOOD PRESSURE SUPPORT)
PREFILLED_SYRINGE | INTRAVENOUS | Status: DC | PRN
  Administered 2018-02-27 (×3): 80 ug via INTRAVENOUS

## 2018-02-27 SURGICAL SUPPLY — 43 items
ADH SKN CLS APL DERMABOND .7 (GAUZE/BANDAGES/DRESSINGS) ×1
BAG DECANTER FOR FLEXI CONT (MISCELLANEOUS) ×2 IMPLANT
BINDER BREAST LRG (GAUZE/BANDAGES/DRESSINGS) IMPLANT
BINDER BREAST XLRG (GAUZE/BANDAGES/DRESSINGS) IMPLANT
BLADE 10 SAFETY STRL DISP (BLADE) ×2 IMPLANT
CANISTER SUCT 3000ML PPV (MISCELLANEOUS) ×2 IMPLANT
CHLORAPREP W/TINT 26ML (MISCELLANEOUS) ×2 IMPLANT
COVER SURGICAL LIGHT HANDLE (MISCELLANEOUS) ×2 IMPLANT
DERMABOND ADVANCED (GAUZE/BANDAGES/DRESSINGS) ×1
DERMABOND ADVANCED .7 DNX12 (GAUZE/BANDAGES/DRESSINGS) ×1 IMPLANT
DRAIN CHANNEL 15F RND FF W/TCR (WOUND CARE) ×2 IMPLANT
DRAPE ORTHO SPLIT 77X108 STRL (DRAPES) ×4
DRAPE SURG ORHT 6 SPLT 77X108 (DRAPES) ×2 IMPLANT
DRAPE WARM FLUID 44X44 (DRAPE) ×2 IMPLANT
DRSG PAD ABDOMINAL 8X10 ST (GAUZE/BANDAGES/DRESSINGS) ×4 IMPLANT
ELECT BLADE 4.0 EZ CLEAN MEGAD (MISCELLANEOUS) ×2
ELECT REM PT RETURN 9FT ADLT (ELECTROSURGICAL) ×2
ELECTRODE BLDE 4.0 EZ CLN MEGD (MISCELLANEOUS) ×1 IMPLANT
ELECTRODE REM PT RTRN 9FT ADLT (ELECTROSURGICAL) ×1 IMPLANT
EVACUATOR SILICONE 100CC (DRAIN) ×2 IMPLANT
GAUZE SPONGE 4X4 12PLY STRL (GAUZE/BANDAGES/DRESSINGS) ×2 IMPLANT
GLOVE BIO SURGEON STRL SZ 6 (GLOVE) ×2 IMPLANT
GLOVE SURG SS PI 6.0 STRL IVOR (GLOVE) ×2 IMPLANT
GOWN STRL REUS W/ TWL LRG LVL3 (GOWN DISPOSABLE) ×2 IMPLANT
GOWN STRL REUS W/TWL LRG LVL3 (GOWN DISPOSABLE) ×4
KIT BASIN OR (CUSTOM PROCEDURE TRAY) ×2 IMPLANT
KIT TURNOVER KIT B (KITS) ×2 IMPLANT
NS IRRIG 1000ML POUR BTL (IV SOLUTION) ×4 IMPLANT
PACK GENERAL/GYN (CUSTOM PROCEDURE TRAY) ×2 IMPLANT
PAD ABD 8X10 STRL (GAUZE/BANDAGES/DRESSINGS) ×1 IMPLANT
PAD ARMBOARD 7.5X6 YLW CONV (MISCELLANEOUS) ×2 IMPLANT
PIN SAFETY STERILE (MISCELLANEOUS) ×2 IMPLANT
SOL PREP POV-IOD 4OZ 10% (MISCELLANEOUS) IMPLANT
STAPLER VISISTAT 35W (STAPLE) ×2 IMPLANT
SUT CHROMIC 4 0 PS 2 18 (SUTURE) IMPLANT
SUT ETHILON 2 0 FS 18 (SUTURE) ×2 IMPLANT
SUT MNCRL AB 4-0 PS2 18 (SUTURE) ×2 IMPLANT
SUT VIC AB 3-0 PS2 18 (SUTURE) ×2
SUT VIC AB 3-0 PS2 18XBRD (SUTURE) ×1 IMPLANT
SUT VIC AB 4-0 PS2 27 (SUTURE) ×2 IMPLANT
TOWEL OR 17X24 6PK STRL BLUE (TOWEL DISPOSABLE) ×2 IMPLANT
TOWEL OR 17X26 10 PK STRL BLUE (TOWEL DISPOSABLE) ×2 IMPLANT
TRAY FOLEY MTR SLVR 14FR STAT (SET/KITS/TRAYS/PACK) IMPLANT

## 2018-02-27 NOTE — Op Note (Signed)
Operative Note   DATE OF OPERATION: 9.23.19  LOCATION: [facility]-outpatient  SURGICAL DIVISION: Plastic Surgery  PREOPERATIVE DIAGNOSES:  1. History right breast cancer 2. Acquired absence breast 3. HIstory therapeutic radiation 4. Open wound chest with complication 5. Mechanical complication tissue expander  POSTOPERATIVE DIAGNOSES:  same  PROCEDURE:  Removal right chest tissue expander  SURGEON: [name] MD MBA  ASSISTANT: none  ANESTHESIA:  General.   EBL: 10 ml  COMPLICATIONS: None immediate.   INDICATIONS FOR PROCEDURE:  The patient, [name], is a 77 y.o. female born on 02/22/1941, is here for removal right chest tissue expander for ulceration and exposure through mastectomy flap, partial deflation   FINDINGS: Open wound with exposed expander and additional area medial threatened ulceration. Partial deflation expander.  DESCRIPTION OF PROCEDURE:  The patient's operative site was marked with the patient in the preoperative area. The patient was taken to the operating room. SCDs were placed and IV antibiotics were given. The patient's operative site was prepped and draped in a sterile fashion. A time out was performed and all information was confirmed to be correct. Sharp elliptical excision of mastectomy flap surrounding open wound completed. Expander removed. Curretage performed to cavity. Irrigated cavity with saline. 15 Fr JP placed in cavity and secured with 2-0 nylon. Closure completed with 3-0 vicryl in superficial fascia, 4-0 vicryl in dermis and 4-0 monocryl subcuticular skin closure. Dermabond and dry dressing applied.   The patient was allowed to wake from anesthesia, extubated and taken to the recovery room in satisfactory condition.   SPECIMENS: right mastectomy flap  DRAINS: 15 Fr JP in right chest  [name], MD [facility] Plastic & Reconstructive Surgery [phone], pin [phone]

## 2018-02-27 NOTE — Anesthesia Postprocedure Evaluation (Signed)
Anesthesia Post Note  Patient: [name]  Procedure(s) Performed: REMOVAL OF TISSUE EXPANDER (Right )     Patient location during evaluation: PACU Anesthesia Type: General Level of consciousness: awake Pain management: pain level controlled Vital Signs Assessment: post-procedure vital signs reviewed and stable Respiratory status: spontaneous breathing Cardiovascular status: stable Postop Assessment: no apparent nausea or vomiting Anesthetic complications: no    Last Vitals:  Vitals:   02/27/18 1017 02/27/18 1035  BP: 129/84 (!) 156/72  Pulse:    Resp: 18 18  Temp:    SpO2:  94%    Last Pain:  Vitals:   02/27/18 1035  PainSc: 0-No pain                 Tayia Stonesifer

## 2018-02-27 NOTE — Transfer of Care (Signed)
Immediate Anesthesia Transfer of Care Note  Patient: [name]  Procedure(s) Performed: REMOVAL OF TISSUE EXPANDER (Right )  Patient Location: PACU  Anesthesia Type:General  Level of Consciousness: awake, alert , oriented and patient cooperative  Airway & Oxygen Therapy: Patient Spontanous Breathing and Patient connected to nasal cannula oxygen  Post-op Assessment: Report given to RN and Post -op Vital signs reviewed and stable  Post vital signs: Reviewed and stable--spo2 reading mid to high 90s. Fingers still cold to touch. Patient mentating appropriately.  Last Vitals:  Vitals Value Taken Time  BP    Temp    Pulse 86 02/27/2018  8:58 AM  Resp 19 02/27/2018  9:00 AM  SpO2 87 % 02/27/2018  8:58 AM  Vitals shown include unvalidated device data.  Last Pain:  Vitals:   02/27/18 0700  PainSc: 0-No pain      Patients Stated Pain Goal: 0 (26/33/35 4562)  Complications: No apparent anesthesia complications

## 2018-02-27 NOTE — Discharge Instructions (Signed)

## 2018-02-27 NOTE — Anesthesia Procedure Notes (Signed)
Procedure Name: LMA Insertion Date/Time: 02/27/2018 8:17 AM Performed by: Raenette Rover, CRNA Pre-anesthesia Checklist: Patient identified, Emergency Drugs available, Suction available and Patient being monitored Patient Re-evaluated:Patient Re-evaluated prior to induction Oxygen Delivery Method: Circle system utilized Preoxygenation: Pre-oxygenation with 100% oxygen Induction Type: IV induction LMA: LMA inserted LMA Size: 4.0 Number of attempts: 1 Placement Confirmation: positive ETCO2,  CO2 detector and breath sounds checked- equal and bilateral Tube secured with: Tape Dental Injury: Teeth and Oropharynx as per pre-operative assessment

## 2018-02-27 NOTE — Interval H&P Note (Signed)
History and Physical Interval Note:  02/27/2018 7:00 AM  [name]  has presented today for surgery, with the diagnosis of Breast CA  The various methods of treatment have been discussed with the patient and family. After consideration of risks, benefits and other options for treatment, the patient has consented to  Procedure(s): REMOVAL OF TISSUE EXPANDER (Right) as a surgical intervention .  The patient's history has been reviewed, patient examined, no change in status, stable for surgery.  I have reviewed the patient's chart and labs.  Questions were answered to the patient's satisfaction.     [name]

## 2018-02-27 NOTE — Anesthesia Preprocedure Evaluation (Signed)
Anesthesia Evaluation  Patient identified by MRN, date of birth, ID band Patient awake    Reviewed: Allergy & Precautions, NPO status , Patient's Chart, lab work & pertinent test results  History of Anesthesia Complications (+) PSEUDOCHOLINESTERASE DEFICIENCY  Airway Mallampati: II  TM Distance: >3 FB     Dental   Pulmonary asthma , pneumonia,    breath sounds clear to auscultation       Cardiovascular hypertension, + dysrhythmias  Rhythm:Regular Rate:Normal     Neuro/Psych    GI/Hepatic negative GI ROS, Neg liver ROS,   Endo/Other    Renal/GU Renal disease     Musculoskeletal  (+) Arthritis ,   Abdominal   Peds  Hematology  (+) anemia ,   Anesthesia Other Findings   Reproductive/Obstetrics                             Anesthesia Physical Anesthesia Plan  ASA: III  Anesthesia Plan: General   Post-op Pain Management:    Induction: Intravenous  PONV Risk Score and Plan: Ondansetron, Dexamethasone and Midazolam  Airway Management Planned: Oral ETT and LMA  Additional Equipment:   Intra-op Plan:   Post-operative Plan: Extubation in OR  Informed Consent: I have reviewed the patients History and Physical, chart, labs and discussed the procedure including the risks, benefits and alternatives for the proposed anesthesia with the patient or authorized representative who has indicated his/her understanding and acceptance.   Dental advisory given  Plan Discussed with: Anesthesiologist and CRNA  Anesthesia Plan Comments:         Anesthesia Quick Evaluation

## 2018-02-28 ENCOUNTER — Encounter (HOSPITAL_COMMUNITY): Payer: Self-pay | Admitting: Plastic Surgery

## 2018-03-14 ENCOUNTER — Other Ambulatory Visit (HOSPITAL_COMMUNITY): Payer: Medicare Other

## 2018-03-21 ENCOUNTER — Inpatient Hospital Stay (HOSPITAL_COMMUNITY): Admission: RE | Admit: 2018-03-21 | Payer: Medicare Other | Source: Ambulatory Visit | Admitting: Plastic Surgery

## 2018-03-21 ENCOUNTER — Encounter (HOSPITAL_COMMUNITY): Admission: RE | Payer: Self-pay | Source: Ambulatory Visit

## 2018-03-21 SURGERY — REMOVAL, TISSUE EXPANDER, BREAST, WITH IMPLANT INSERTION
Anesthesia: General | Site: Breast | Laterality: Right

## 2018-03-29 ENCOUNTER — Encounter: Payer: Self-pay | Admitting: Plastic Surgery

## 2018-06-15 NOTE — H&P (Signed)
Subjective:     Patient ID: [name] is a 78 y.o. female.  HPI  16 months post op mastectomy with TE/ADM reconstruction. Course complicated by ulceration expander through mastectomy flap, deflation expander following completion radiation and now 3.5 months post removal right TE. Plan for right breast reconstruction with TE and latissimus flap.  She has had excision skin cancer left back since last visit margins clear. She is scheduled for Mohs RLE [facility] Feb 2020 and plan for repair of this to follow. States had prior Mohs in this same area years ago.   Presented following annual diagnostic MMG with a 3.5 cm spiculated mass in the right breast UIQ. [place] confirmed a 3.5 cm solid mass in the right UIQ, with no abnormalities noted in the right axilla. 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 MRI demonstrated known 2.6 cm mass UIQ. Additional right UOQ 0.5 x 0.7 x 0.3 cm mass noted. 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   Oncotype 21/intermediate risk. Completed adjuvant radiation 11.28.18.   Right mastectomy 397 g  MMG 12/2017 normal  Genetics negative.  Review of Systems    Objective:   Physical Exam  Cardiovascular: Normal rate and normal heart sounds.  Pulmonary/Chest: Effort normal and breath sounds normal.   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 Reviewed radiation will double risks reconstruction to 40% approximately in published studies. Reviewed increased risks radiation include capsular contracture, wound healing problems, failure reconstruction. Reviewed purpose LD flap to bring in non radiated tissue to reduce risks as above.  Reviewed back donor site, hospital stay, drains, process expansion.  Alternatives are consult with microsurgeon for alternative flaps, and  do nothing- given age and comorbidities chronic anticoagulation surgery is not without significant risk to her.  Patient noted last visit that her children husband do not want her to have more surgery but will stand by her decision to proceed.  Hold Xarelto 3 d prior. Plan possible Lovenox post op for 1-2 weeks. Reviewed increased risks bleeding complications.  Additional risks including but not limited to seroma, infection, additional surgery including removal expander, hematoma, damage to deeper structures, asymmetry, unacceptable cosmetic result, asymmetry, DVT/PE, cardiopulmonary complications, failure flap reviewed.   CHG allergy, did fine with Duraprep. Prefers oxycodone for post op pain medication.   [name], MD [facility] Plastic & Reconstructive Surgery [phone], pin [phone]

## 2018-06-19 NOTE — Pre-Procedure Instructions (Signed)
[name]  06/19/2018      [id number] [address] [place] [address] Phone: [phone] Fax: [phone]    Your procedure is scheduled on January 20  Report to Erin at [place].M.  Call this number if you have problems the morning of surgery:  [phone]   Remember:  Do not eat or drink after midnight.    Take these medicines the morning of surgery with A SIP OF WATER  albuterol (PROVENTIL HFA;VENTOLIN HFA) if needed bring with you the day of surgery ALPRAZolam (XANAX) BREO ELLIPTA  letrozole (FEMARA) methocarbamol (ROBAXIN)  propranolol ER (INDERAL LA Eye drop if needed traMADol (ULTRAM venlafaxine XR (EFFEXOR-XR)   7 days prior to surgery STOP taking any Aspirin (unless otherwise instructed by your surgeon), Aleve, Naproxen, Ibuprofen, Motrin, Advil, Goody's, BC's, all herbal medications, fish oil, and all vitamins.  Follow your surgeon's instructions on when to stoprivaroxaban Alveda Reasons) .  If no instructions were given by your surgeon then you will need to call the office to get those instructions.       Do not wear jewelry, make-up or nail polish.  Do not wear lotions, powders, or perfumes, or deodorant.  Do not shave 48 hours prior to surgery.    Do not bring valuables to the hospital.  [facility] is not responsible for any belongings or valuables.  Contacts, dentures or bridgework may not be worn into surgery.  Leave your suitcase in the car.  After surgery it may be brought to your room.  For patients admitted to the hospital, discharge time will be determined by your treatment team.  Patients discharged the day of surgery will not be allowed to drive home.    Special instructions:   [place]- Preparing For Surgery  Before surgery, you can play an important role. Because skin is not sterile, your skin needs to be as free of germs as possible. You can reduce the number  of germs on your skin by washing with CHG (chlorahexidine gluconate) Soap before surgery.  CHG is an antiseptic cleaner which kills germs and bonds with the skin to continue killing germs even after washing.    Oral Hygiene is also important to reduce your risk of infection.  Remember - BRUSH YOUR TEETH THE MORNING OF SURGERY WITH YOUR REGULAR TOOTHPASTE  Please do not use if you have an allergy to CHG or antibacterial soaps. If your skin becomes reddened/irritated stop using the CHG.  Do not shave (including legs and underarms) for at least 48 hours prior to first CHG shower. It is OK to shave your face.  Please follow these instructions carefully.   1. Shower the NIGHT BEFORE SURGERY and the MORNING OF SURGERY with CHG.   2. If you chose to wash your hair, wash your hair first as usual with your normal shampoo.  3. After you shampoo, rinse your hair and body thoroughly to remove the shampoo.  4. Use CHG as you would any other liquid soap. You can apply CHG directly to the skin and wash gently with a scrungie or a clean washcloth.   5. Apply the CHG Soap to your body ONLY FROM THE NECK DOWN.  Do not use on open wounds or open sores. Avoid contact with your eyes, ears, mouth and genitals (private parts). Wash Face and genitals (private parts)  with your normal soap.  6. Wash thoroughly, paying special attention to  the area where your surgery will be performed.  7. Thoroughly rinse your body with warm water from the neck down.  8. DO NOT shower/wash with your normal soap after using and rinsing off the CHG Soap.  9. Pat yourself dry with a CLEAN TOWEL.  10. Wear CLEAN PAJAMAS to bed the night before surgery, wear comfortable clothes the morning of surgery  11. Place CLEAN SHEETS on your bed the night of your first shower and DO NOT SLEEP WITH PETS.    Day of Surgery:  Do not apply any deodorants/lotions.  Please wear clean clothes to the hospital/surgery center.   Remember to  brush your teeth WITH YOUR REGULAR TOOTHPASTE.    Please read over the following fact sheets that you were given.

## 2018-06-20 ENCOUNTER — Encounter (HOSPITAL_COMMUNITY)
Admission: RE | Admit: 2018-06-20 | Discharge: 2018-06-20 | Disposition: A | Discharge status: Home / Self Care | Payer: Medicare Other | Source: Ambulatory Visit | Attending: Plastic Surgery | Admitting: Plastic Surgery

## 2018-06-20 ENCOUNTER — Encounter (HOSPITAL_COMMUNITY): Payer: Self-pay

## 2018-06-20 ENCOUNTER — Other Ambulatory Visit: Payer: Self-pay

## 2018-06-20 DIAGNOSIS — Z01812 Encounter for preprocedural laboratory examination: Secondary | ICD-10-CM | POA: Insufficient documentation

## 2018-06-20 HISTORY — DX: Personal history of urinary calculi: Z87.442

## 2018-06-20 LAB — CBC WITH DIFFERENTIAL/PLATELET
ABS IMMATURE GRANULOCYTES: 0.04 10*3/uL (ref 0.00–0.07)
BASOS PCT: 1 %
Basophils Absolute: 0.1 10*3/uL (ref 0.0–0.1)
Eosinophils Absolute: 0.3 10*3/uL (ref 0.0–0.5)
Eosinophils Relative: 4 %
HEMATOCRIT: 40.2 % (ref 36.0–46.0)
HEMOGLOBIN: 13.3 g/dL (ref 12.0–15.0)
IMMATURE GRANULOCYTES: 1 %
LYMPHS ABS: 1.3 10*3/uL (ref 0.7–4.0)
LYMPHS PCT: 17 %
MCH: 33.2 pg (ref 26.0–34.0)
MCHC: 33.1 g/dL (ref 30.0–36.0)
MCV: 100.2 fL — AB (ref 80.0–100.0)
MONOS PCT: 15 %
Monocytes Absolute: 1.2 10*3/uL — ABNORMAL HIGH (ref 0.1–1.0)
NEUTROS ABS: 4.7 10*3/uL (ref 1.7–7.7)
NEUTROS PCT: 62 %
PLATELETS: 260 10*3/uL (ref 150–400)
RBC: 4.01 MIL/uL (ref 3.87–5.11)
RDW: 12.4 % (ref 11.5–15.5)
WBC: 7.6 10*3/uL (ref 4.0–10.5)
nRBC: 0 % (ref 0.0–0.2)

## 2018-06-20 LAB — BASIC METABOLIC PANEL
ANION GAP: 10 (ref 5–15)
BUN: 10 mg/dL (ref 8–23)
CO2: 29 mmol/L (ref 22–32)
Calcium: 9.6 mg/dL (ref 8.9–10.3)
Chloride: 96 mmol/L — ABNORMAL LOW (ref 98–111)
Creatinine, Ser: 0.74 mg/dL (ref 0.44–1.00)
GFR calc Af Amer: >60 mL/min (ref >60)
GFR calc non Af Amer: >60 mL/min (ref >60)
GLUCOSE: 116 mg/dL — AB (ref 70–99)
POTASSIUM: 3.6 mmol/L (ref 3.5–5.1)
Sodium: 135 mmol/L (ref 135–145)

## 2018-06-20 NOTE — Progress Notes (Signed)
PCP - [place]  Chest x-ray - not needed EKG - 02/27/18 Stress Test - requesting but the patient says she had one but cant remember where ECHO - requesting but the patient says she had one but cant remember where Cardiac Cath - denies    Blood Thinner Instructions: last dose will be 06/22/18 Aspirin Instructions: n/a  Anesthesia review: yes, cardiac history  Patient denies shortness of breath, fever, cough and chest pain at PAT appointment   Patient verbalized understanding of instructions that were given to them at the PAT appointment. Patient was also instructed that they will need to review over the PAT instructions again at home before surgery.

## 2018-06-21 NOTE — Progress Notes (Signed)
Anesthesia Chart Review:  Case:  [id number] Date/Time:  06/26/18 0700   Procedures:      RIGHT BREAST RECONSTRUCTION WITH PLACEMENT OF TISSUE EXPANDER (Right Breast)     LATISSIMUS FLAP TO RIGHT CHEST (Right Chest)   Anesthesia type:  General   Pre-op diagnosis:  history breast cancer, acquired absence breast, history therapeutic radiation   Location:  [facility] OR ROOM 06 / [place] OR   Surgeon:  [name], MD      DISCUSSION: Patient is a 78 year old female scheduled for the above procedure.  History includes afib, HTN, asthma, melanoma, breast cancer (s/p right mastectomy, tissue expander 01/28/17; s/p radiation 03/23/17-05/04/17; s/p removal of right tissue expander for ulceration through mastectomy flap following radiation 02/27/18), concussion (remote history), never smoker.  At her 11/24/17 cardiology visit, Dr. [name] wrote, "Ffrom a cardiac viewpoint may proceed with planned breast reconstructive surgery.  No additional cardiovascular work-up is necessary.  It is not necessary to bridge anticoagulation with heparin.  She may discontinue her Xarelto 3 days prior to surgery and restarted when her bleeding risk from reconstruction is diminished and her drains are out."  Patient reported last dose of Xarelto will be 06/22/18. She is for PT/INR on the day of surgery.   If no acute changes then I would anticipate that she can proceed as planned.   VS: BP 136/71   Pulse 88   Temp 36.9 C   Resp 18   Ht 5' 0.5" (1.537 m)   Wt 68.5 kg   SpO2 96%   BMI 29.00 kg/m   PROVIDERS: [name], MD is PCP Ezzard Standing, MD is cardiologist. Office note on 11/24/17 scanned under Media tab.  Magrinat, Sarajane Jews, MD is HEM-ONC   LABS: Labs reviewed: Acceptable for surgery. PT/INR prior to surgery. (all labs ordered are listed, but only abnormal results are displayed)  Labs Reviewed  BASIC METABOLIC PANEL - Abnormal; Notable for the following components:      Result Value   Chloride 96 (*)     Glucose, Bld 116 (*)    All other components within normal limits  CBC WITH DIFFERENTIAL/PLATELET - Abnormal; Notable for the following components:   MCV 100.2 (*)    Monocytes Absolute 1.2 (*)    All other components within normal limits    EKG: 02/27/18: Afib at 75 bpm, nonspecific ST abnormality.   CV: Nuclear stress test 01/10/13 (Media tab, Correspondence, 01/24/13): 1. Post stress myocardial perfusion images show a normal pattern of perfusion in all regions. The post stress LV is normal in size. 2. Post stress LV function is normal. EF 74%. 3. Unremarkable pharmacological stress test. 4. Normal myocardial perfusion study. This was essentially a low risk scan.  Echo 12/20/16 (as outlined in 12/20/16 Dr. Thurman Coyer note scanned under Media tab, Correspondence, 01/28/17): - Mild concentric LVH and mild enlargement of LA. EF is around 50%.   Past Medical History:  Diagnosis Date  . Anemia    history of  . Anxiety   . Arthritis   . Asthma   . Breast cancer ([place])   . Cancer ([place])    skin cancers, one melanoma   . Complication of anesthesia    patient woke up during colonoscopy  . Concussion    8 yrs. ago due to being thrown from a horse  . Dislocated elbow 10/12/2016   left  . Dysrhythmia 12/2016   A-fib  . Family history of breast cancer   . History of kidney  stones   . History of radiation therapy 03/23/17-05/04/17   right chest wall 50.4 Gy in 28 fractions, right axilla 45 Gy in 25 fractions  . Hypertension   . 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 Kidney stones    hx of   . Melanoma ([place])   . Persistent atrial fibrillation 12/16/2016   CHA2DS2VASC score 3  . Pneumonia    hx of     Past Surgical History:  Procedure Laterality Date  . brachial skin removal due to deforimity Bilateral   . BREAST RECONSTRUCTION WITH PLACEMENT OF TISSUE EXPANDER AND FLEX HD (ACELLULAR HYDRATED DERMIS) Right 01/28/2017    RIGHT BREAST RECONSTRUCTION WITH PLACEMENT OF TISSUE  EXPANDER AND ALLODERM;  Surgeon: [name], MD;  Location: [facility] [facility];  Service: Plastics;  Laterality: Right;  . BUNIONECTOMY     left   . CATARACT EXTRACTION     bilaterla cataract surgery   . COLONOSCOPY W/ POLYPECTOMY    . KNEE ARTHROSCOPY     left   . MASTECTOMY W/ SENTINEL NODE BIOPSY Right 01/28/2017   Procedure: RIGHT TOTAL MASTECTOMY WITH SENTINEL LYMPH NODE BIOPSY;  Surgeon: Excell Seltzer, MD;  Location: [place];  Service: General;  Laterality: Right;  . MELANOMA EXCISION Left   . polyp removed from uterus     . right wrist pinning     . skin cancers removed    . TISSUE EXPANDER PLACEMENT Right 02/27/2018   Procedure: REMOVAL OF TISSUE EXPANDER;  Surgeon: [name], MD;  Location: [place];  Service: Plastics;  Laterality: Right;  . TOTAL KNEE ARTHROPLASTY Right 01/29/2013   Procedure: RIGHT TOTAL KNEE ARTHROPLASTY;  Surgeon: Gearlean Alf, MD;  Location: [facility];  Service: Orthopedics;  Laterality: Right;  . TOTAL KNEE ARTHROPLASTY Left 08/29/2017   Procedure: LEFT TOTAL KNEE ARTHROPLASTY;  Surgeon: Gaynelle Arabian, MD;  Location: [facility];  Service: Orthopedics;  Laterality: Left;    MEDICATIONS: . albuterol (PROVENTIL HFA;VENTOLIN HFA) 108 (90 Base) MCG/ACT inhaler  . ALPRAZolam (XANAX) 0.5 MG tablet  . BREO ELLIPTA 100-25 MCG/INH AEPB  . desonide (DESOWEN) 0.05 % cream  . diclofenac sodium (VOLTAREN) 1 % GEL  . EPINEPHrine (EPIPEN 2-PAK) 0.3 mg/0.3 mL IJ SOAJ injection  . letrozole (FEMARA) 2.5 MG tablet  . levocetirizine (XYZAL) 5 MG tablet  . methocarbamol (ROBAXIN) 500 MG tablet  . oxyCODONE (ROXICODONE) 5 MG immediate release tablet  . Phenazopyridine HCl (AZO TABS PO)  . Polyethyl Glycol-Propyl Glycol (SYSTANE OP)  . Potassium 99 MG TABS  . propranolol ER (INDERAL LA) 80 MG 24 hr capsule  . ranitidine (ZANTAC) 150 MG capsule  . RESTASIS 0.05 % ophthalmic emulsion  . rivaroxaban (XARELTO) 20 MG TABS tablet  . traMADol (ULTRAM) 50 MG tablet  .  triamterene-hydrochlorothiazide (MAXZIDE-25) 37.5-25 MG per tablet  . venlafaxine XR (EFFEXOR-XR) 75 MG 24 hr capsule   No current facility-administered medications for this encounter.     Myra Gianotti, PA-C Surgical Short Stay/Anesthesiology [facility] Phone [phone] [facility] Phone [phone] 06/21/2018 2:20 PM

## 2018-06-21 NOTE — Anesthesia Preprocedure Evaluation (Addendum)
Anesthesia Evaluation  Patient identified by MRN, date of birth, ID band Patient awake    Reviewed: Allergy & Precautions, NPO status , Patient's Chart, lab work & pertinent test results  Airway Mallampati: II  TM Distance: >3 FB Neck ROM: Full    Dental no notable dental hx.    Pulmonary asthma ,    Pulmonary exam normal breath sounds clear to auscultation       Cardiovascular hypertension, Pt. on medications Normal cardiovascular exam+ dysrhythmias Atrial Fibrillation  Rhythm:Regular Rate:Normal     Neuro/Psych negative neurological ROS  negative psych ROS   GI/Hepatic negative GI ROS, Neg liver ROS,   Endo/Other  negative endocrine ROS  Renal/GU negative Renal ROS  negative genitourinary   Musculoskeletal negative musculoskeletal ROS (+)   Abdominal   Peds negative pediatric ROS (+)  Hematology negative hematology ROS (+)   Anesthesia Other Findings   Reproductive/Obstetrics negative OB ROS                            Anesthesia Physical Anesthesia Plan  ASA: II  Anesthesia Plan: General   Post-op Pain Management:    Induction: Intravenous  PONV Risk Score and Plan: 3 and Ondansetron, Dexamethasone and Treatment may vary due to age or medical condition  Airway Management Planned: Oral ETT  Additional Equipment:   Intra-op Plan:   Post-operative Plan: Extubation in OR  Informed Consent: I have reviewed the patients History and Physical, chart, labs and discussed the procedure including the risks, benefits and alternatives for the proposed anesthesia with the patient or authorized representative who has indicated his/her understanding and acceptance.     Dental advisory given  Plan Discussed with: CRNA  Anesthesia Plan Comments: (PAT note written 06/21/2018 by Myra Gianotti, PA-C. )       Anesthesia Quick Evaluation

## 2018-06-26 ENCOUNTER — Inpatient Hospital Stay (HOSPITAL_COMMUNITY)
Admission: RE | Admit: 2018-06-26 | Discharge: 2018-06-28 | DRG: 585 | Disposition: A | Discharge status: Home / Self Care | Payer: Medicare Other | Source: Ambulatory Visit | Attending: Plastic Surgery | Admitting: Plastic Surgery

## 2018-06-26 ENCOUNTER — Inpatient Hospital Stay (HOSPITAL_COMMUNITY): Payer: Medicare Other | Admitting: Certified Registered Nurse Anesthetist

## 2018-06-26 ENCOUNTER — Encounter (HOSPITAL_COMMUNITY)
Admission: RE | Disposition: A | Discharge status: Home / Self Care | Payer: Self-pay | Source: Ambulatory Visit | Attending: Plastic Surgery

## 2018-06-26 ENCOUNTER — Inpatient Hospital Stay (HOSPITAL_COMMUNITY): Payer: Medicare Other | Admitting: Vascular Surgery

## 2018-06-26 ENCOUNTER — Other Ambulatory Visit: Payer: Self-pay

## 2018-06-26 ENCOUNTER — Encounter (HOSPITAL_COMMUNITY): Payer: Self-pay | Admitting: *Deleted

## 2018-06-26 DIAGNOSIS — Z170 Estrogen receptor positive status [ER+]: Secondary | ICD-10-CM | POA: Diagnosis not present

## 2018-06-26 DIAGNOSIS — Z853 Personal history of malignant neoplasm of breast: Secondary | ICD-10-CM | POA: Diagnosis not present

## 2018-06-26 DIAGNOSIS — Z421 Encounter for breast reconstruction following mastectomy: Secondary | ICD-10-CM | POA: Diagnosis not present

## 2018-06-26 DIAGNOSIS — Z85828 Personal history of other malignant neoplasm of skin: Secondary | ICD-10-CM | POA: Diagnosis not present

## 2018-06-26 DIAGNOSIS — I4891 Unspecified atrial fibrillation: Secondary | ICD-10-CM | POA: Diagnosis present

## 2018-06-26 HISTORY — PX: BREAST RECONSTRUCTION: SHX9

## 2018-06-26 HISTORY — PX: LATISSIMUS FLAP TO BREAST: SHX5357

## 2018-06-26 HISTORY — PX: BREAST RECONSTRUCTION WITH PLACEMENT OF TISSUE EXPANDER AND ALLODERM: SHX6805

## 2018-06-26 HISTORY — DX: Gastro-esophageal reflux disease without esophagitis: K21.9

## 2018-06-26 LAB — PROTIME-INR
INR: 1.03
PROTHROMBIN TIME: 13.4 s (ref 11.4–15.2)

## 2018-06-26 SURGERY — BREAST RECONSTRUCTION WITH PLACEMENT OF TISSUE EXPANDER AND ALLODERM
Anesthesia: General | Site: Chest | Laterality: Right

## 2018-06-26 MED ORDER — TRIAMTERENE-HCTZ 37.5-25 MG PO TABS
1.0000 | ORAL_TABLET | Freq: Every morning | ORAL | Status: DC
  Administered 2018-06-27: 1 via ORAL
  Filled 2018-06-26 (×3): qty 1

## 2018-06-26 MED ORDER — BUPIVACAINE LIPOSOME 1.3 % IJ SUSP
20.0000 mL | INTRAMUSCULAR | Status: AC
  Administered 2018-06-26: 20 mL
  Filled 2018-06-26: qty 20

## 2018-06-26 MED ORDER — OXYCODONE HCL 5 MG PO TABS
5.0000 mg | ORAL_TABLET | ORAL | Status: DC | PRN
  Administered 2018-06-26 – 2018-06-27 (×5): 10 mg via ORAL
  Filled 2018-06-26 (×5): qty 2

## 2018-06-26 MED ORDER — METHOCARBAMOL 500 MG PO TABS
500.0000 mg | ORAL_TABLET | Freq: Four times a day (QID) | ORAL | Status: DC | PRN
  Administered 2018-06-26 – 2018-06-27 (×3): 500 mg via ORAL
  Filled 2018-06-26 (×3): qty 1

## 2018-06-26 MED ORDER — PROPOFOL 10 MG/ML IV BOLUS
INTRAVENOUS | Status: DC | PRN
  Administered 2018-06-26: 150 mg via INTRAVENOUS

## 2018-06-26 MED ORDER — ENOXAPARIN SODIUM 40 MG/0.4ML ~~LOC~~ SOLN
40.0000 mg | SUBCUTANEOUS | Status: DC
  Administered 2018-06-27 – 2018-06-28 (×2): 40 mg via SUBCUTANEOUS
  Filled 2018-06-26 (×2): qty 0.4

## 2018-06-26 MED ORDER — METOCLOPRAMIDE HCL 5 MG/ML IJ SOLN: 10.0000 mg | Freq: Once | INTRAMUSCULAR | Status: DC | PRN

## 2018-06-26 MED ORDER — CIPROFLOXACIN HCL 500 MG PO TABS: 500.0000 mg | ORAL_TABLET | Freq: Two times a day (BID) | ORAL | 0 refills | Status: AC

## 2018-06-26 MED ORDER — CEFAZOLIN SODIUM-DEXTROSE 1-4 GM/50ML-% IV SOLN
1.0000 g | Freq: Three times a day (TID) | INTRAVENOUS | Status: DC
  Administered 2018-06-26 – 2018-06-28 (×5): 1 g via INTRAVENOUS
  Filled 2018-06-26 (×6): qty 50

## 2018-06-26 MED ORDER — KCL IN DEXTROSE-NACL 20-5-0.45 MEQ/L-%-% IV SOLN
INTRAVENOUS | Status: DC
  Administered 2018-06-26 – 2018-06-27 (×2): via INTRAVENOUS
  Filled 2018-06-26 (×3): qty 1000

## 2018-06-26 MED ORDER — LORATADINE 10 MG PO TABS
10.0000 mg | ORAL_TABLET | Freq: Every day | ORAL | Status: DC
  Administered 2018-06-26 – 2018-06-27 (×2): 10 mg via ORAL
  Filled 2018-06-26 (×2): qty 1

## 2018-06-26 MED ORDER — FENTANYL CITRATE (PF) 250 MCG/5ML IJ SOLN
INTRAMUSCULAR | Status: DC | PRN
  Administered 2018-06-26 (×5): 50 ug via INTRAVENOUS

## 2018-06-26 MED ORDER — POLYVINYL ALCOHOL 1.4 % OP SOLN
1.0000 [drp] | Freq: Every day | OPHTHALMIC | Status: DC
  Administered 2018-06-27: 1 [drp] via OPHTHALMIC
  Filled 2018-06-26: qty 15

## 2018-06-26 MED ORDER — SODIUM CHLORIDE 0.9 % IV SOLN
INTRAVENOUS | Status: DC | PRN
  Administered 2018-06-26: 25 ug/min via INTRAVENOUS

## 2018-06-26 MED ORDER — PROPOFOL 10 MG/ML IV BOLUS
INTRAVENOUS | Status: AC
  Filled 2018-06-26: qty 20

## 2018-06-26 MED ORDER — ROCURONIUM BROMIDE 10 MG/ML (PF) SYRINGE
PREFILLED_SYRINGE | INTRAVENOUS | Status: DC | PRN
  Administered 2018-06-26: 50 mg via INTRAVENOUS

## 2018-06-26 MED ORDER — ALPRAZOLAM 0.5 MG PO TABS
0.5000 mg | ORAL_TABLET | Freq: Every day | ORAL | Status: DC | PRN
  Administered 2018-06-28: 0.5 mg via ORAL
  Filled 2018-06-26: qty 1

## 2018-06-26 MED ORDER — MEPERIDINE HCL 50 MG/ML IJ SOLN: 6.2500 mg | INTRAMUSCULAR | Status: DC | PRN

## 2018-06-26 MED ORDER — ONDANSETRON 4 MG PO TBDP: 4.0000 mg | ORAL_TABLET | Freq: Four times a day (QID) | ORAL | Status: DC | PRN

## 2018-06-26 MED ORDER — FAMOTIDINE 20 MG PO TABS
20.0000 mg | ORAL_TABLET | Freq: Every day | ORAL | Status: DC
  Administered 2018-06-26 – 2018-06-27 (×2): 20 mg via ORAL
  Filled 2018-06-26 (×2): qty 1

## 2018-06-26 MED ORDER — CYCLOSPORINE 0.05 % OP EMUL
1.0000 [drp] | Freq: Every day | OPHTHALMIC | Status: DC
  Administered 2018-06-27: 1 [drp] via OPHTHALMIC
  Filled 2018-06-26 (×2): qty 1

## 2018-06-26 MED ORDER — FENTANYL CITRATE (PF) 100 MCG/2ML IJ SOLN: 25.0000 ug | INTRAMUSCULAR | Status: DC | PRN

## 2018-06-26 MED ORDER — LIDOCAINE 2% (20 MG/ML) 5 ML SYRINGE
INTRAMUSCULAR | Status: DC | PRN
  Administered 2018-06-26: 100 mg via INTRAVENOUS

## 2018-06-26 MED ORDER — DEXAMETHASONE SODIUM PHOSPHATE 10 MG/ML IJ SOLN
INTRAMUSCULAR | Status: DC | PRN
  Administered 2018-06-26: 10 mg via INTRAVENOUS

## 2018-06-26 MED ORDER — METHOCARBAMOL 500 MG PO TABS: 500.0000 mg | ORAL_TABLET | Freq: Three times a day (TID) | ORAL | 0 refills | Status: DC | PRN

## 2018-06-26 MED ORDER — 0.9 % SODIUM CHLORIDE (POUR BTL) OPTIME
TOPICAL | Status: DC | PRN
  Administered 2018-06-26: 1000 mL

## 2018-06-26 MED ORDER — GABAPENTIN 300 MG PO CAPS
300.0000 mg | ORAL_CAPSULE | Freq: Two times a day (BID) | ORAL | Status: DC
  Administered 2018-06-26 – 2018-06-27 (×4): 300 mg via ORAL
  Filled 2018-06-26 (×5): qty 1

## 2018-06-26 MED ORDER — VENLAFAXINE HCL ER 75 MG PO CP24
75.0000 mg | ORAL_CAPSULE | Freq: Every day | ORAL | Status: DC
  Administered 2018-06-27: 75 mg via ORAL
  Filled 2018-06-26: qty 1

## 2018-06-26 MED ORDER — DEXAMETHASONE SODIUM PHOSPHATE 10 MG/ML IJ SOLN
INTRAMUSCULAR | Status: AC
  Filled 2018-06-26: qty 1

## 2018-06-26 MED ORDER — LACTATED RINGERS IV SOLN
INTRAVENOUS | Status: DC | PRN
  Administered 2018-06-26 (×2): via INTRAVENOUS

## 2018-06-26 MED ORDER — ONDANSETRON HCL 4 MG/2ML IJ SOLN
INTRAMUSCULAR | Status: DC | PRN
  Administered 2018-06-26: 4 mg via INTRAVENOUS

## 2018-06-26 MED ORDER — OXYCODONE HCL 5 MG PO TABS: 5.0000 mg | ORAL_TABLET | ORAL | 0 refills | Status: DC | PRN

## 2018-06-26 MED ORDER — LIDOCAINE 2% (20 MG/ML) 5 ML SYRINGE
INTRAMUSCULAR | Status: AC
  Filled 2018-06-26: qty 5

## 2018-06-26 MED ORDER — ACETAMINOPHEN 500 MG PO TABS
1000.0000 mg | ORAL_TABLET | ORAL | Status: AC
  Administered 2018-06-26: 1000 mg via ORAL
  Filled 2018-06-26: qty 2

## 2018-06-26 MED ORDER — ROCURONIUM BROMIDE 50 MG/5ML IV SOSY
PREFILLED_SYRINGE | INTRAVENOUS | Status: AC
  Filled 2018-06-26: qty 5

## 2018-06-26 MED ORDER — ALBUTEROL SULFATE (2.5 MG/3ML) 0.083% IN NEBU: 2.5000 mg | INHALATION_SOLUTION | Freq: Four times a day (QID) | RESPIRATORY_TRACT | Status: DC | PRN

## 2018-06-26 MED ORDER — PROPRANOLOL HCL ER 80 MG PO CP24
80.0000 mg | ORAL_CAPSULE | Freq: Every day | ORAL | Status: DC
  Administered 2018-06-27: 80 mg via ORAL
  Filled 2018-06-26 (×2): qty 1

## 2018-06-26 MED ORDER — GABAPENTIN 300 MG PO CAPS
300.0000 mg | ORAL_CAPSULE | ORAL | Status: AC
  Administered 2018-06-26: 300 mg via ORAL
  Filled 2018-06-26: qty 1

## 2018-06-26 MED ORDER — ONDANSETRON HCL 4 MG/2ML IJ SOLN
INTRAMUSCULAR | Status: AC
  Filled 2018-06-26: qty 2

## 2018-06-26 MED ORDER — HYDROMORPHONE HCL 1 MG/ML IJ SOLN
0.5000 mg | INTRAMUSCULAR | Status: DC | PRN
  Administered 2018-06-26 – 2018-06-27 (×2): 0.5 mg via INTRAVENOUS
  Filled 2018-06-26 (×2): qty 1

## 2018-06-26 MED ORDER — FLUTICASONE FUROATE-VILANTEROL 100-25 MCG/INH IN AEPB
1.0000 | INHALATION_SPRAY | Freq: Every day | RESPIRATORY_TRACT | Status: DC
  Administered 2018-06-27 – 2018-06-28 (×2): 1 via RESPIRATORY_TRACT
  Filled 2018-06-26: qty 28

## 2018-06-26 MED ORDER — PHENYLEPHRINE 40 MCG/ML (10ML) SYRINGE FOR IV PUSH (FOR BLOOD PRESSURE SUPPORT)
PREFILLED_SYRINGE | INTRAVENOUS | Status: AC
  Filled 2018-06-26: qty 10

## 2018-06-26 MED ORDER — FENTANYL CITRATE (PF) 250 MCG/5ML IJ SOLN
INTRAMUSCULAR | Status: AC
  Filled 2018-06-26: qty 5

## 2018-06-26 MED ORDER — CEFAZOLIN SODIUM-DEXTROSE 2-4 GM/100ML-% IV SOLN
2.0000 g | INTRAVENOUS | Status: AC
  Administered 2018-06-26: 2 g via INTRAVENOUS
  Filled 2018-06-26: qty 100

## 2018-06-26 MED ORDER — ONDANSETRON HCL 4 MG/2ML IJ SOLN: 4.0000 mg | Freq: Four times a day (QID) | INTRAMUSCULAR | Status: DC | PRN

## 2018-06-26 MED ORDER — LEVOCETIRIZINE DIHYDROCHLORIDE 5 MG PO TABS: 5.0000 mg | ORAL_TABLET | Freq: Every day | ORAL | Status: DC

## 2018-06-26 MED ORDER — PHENYLEPHRINE 40 MCG/ML (10ML) SYRINGE FOR IV PUSH (FOR BLOOD PRESSURE SUPPORT)
PREFILLED_SYRINGE | INTRAVENOUS | Status: DC | PRN
  Administered 2018-06-26: 120 ug via INTRAVENOUS
  Administered 2018-06-26: 80 ug via INTRAVENOUS
  Administered 2018-06-26: 40 ug via INTRAVENOUS
  Administered 2018-06-26 (×2): 80 ug via INTRAVENOUS

## 2018-06-26 MED ORDER — SUGAMMADEX SODIUM 200 MG/2ML IV SOLN
INTRAVENOUS | Status: DC | PRN
  Administered 2018-06-26: 180 mg via INTRAVENOUS

## 2018-06-26 MED ORDER — SODIUM CHLORIDE 0.9 % IV SOLN
Freq: Once | INTRAVENOUS | Status: AC
  Administered 2018-06-26: 1000 mL
  Filled 2018-06-26: qty 1

## 2018-06-26 MED ORDER — POLYETHYL GLYCOL-PROPYL GLYCOL 0.4-0.3 % OP GEL: Freq: Every day | OPHTHALMIC | Status: DC

## 2018-06-26 SURGICAL SUPPLY — 89 items
ADH SKN CLS APL DERMABOND .7 (GAUZE/BANDAGES/DRESSINGS) ×4
APPLIER CLIP 9.375 MED OPEN (MISCELLANEOUS) ×3
APR CLP MED 9.3 20 MLT OPN (MISCELLANEOUS) ×2
BAG DECANTER FOR FLEXI CONT (MISCELLANEOUS) ×3 IMPLANT
BINDER BREAST LRG (GAUZE/BANDAGES/DRESSINGS) IMPLANT
BINDER BREAST XLRG (GAUZE/BANDAGES/DRESSINGS) ×1 IMPLANT
BLADE SURG 10 STRL SS (BLADE) ×3 IMPLANT
BLADE SURG 15 STRL LF DISP TIS (BLADE) IMPLANT
BLADE SURG 15 STRL SS (BLADE) ×3
BNDG COHESIVE 4X5 TAN STRL (GAUZE/BANDAGES/DRESSINGS) ×1 IMPLANT
CANISTER SUCT 3000ML PPV (MISCELLANEOUS) ×3 IMPLANT
CHLORAPREP W/TINT 26ML (MISCELLANEOUS) ×3 IMPLANT
CLIP APPLIE 9.375 MED OPEN (MISCELLANEOUS) ×2 IMPLANT
COVER MAYO STAND STRL (DRAPES) ×3 IMPLANT
COVER SURGICAL LIGHT HANDLE (MISCELLANEOUS) ×4 IMPLANT
COVER WAND RF STERILE (DRAPES) ×3 IMPLANT
DERMABOND ADVANCED (GAUZE/BANDAGES/DRESSINGS) ×2
DERMABOND ADVANCED .7 DNX12 (GAUZE/BANDAGES/DRESSINGS) ×4 IMPLANT
DRAIN CHANNEL 15F RND FF W/TCR (WOUND CARE) ×1 IMPLANT
DRAIN CHANNEL 19F RND (DRAIN) ×1 IMPLANT
DRAPE HALF SHEET 40X57 (DRAPES) ×7 IMPLANT
DRAPE INCISE 23X17 IOBAN STRL (DRAPES)
DRAPE INCISE 23X17 STRL (DRAPES) IMPLANT
DRAPE INCISE IOBAN 23X17 STRL (DRAPES) IMPLANT
DRAPE INCISE IOBAN 66X45 STRL (DRAPES) ×1 IMPLANT
DRAPE INCISE IOBAN 85X60 (DRAPES) ×3 IMPLANT
DRAPE ORTHO SPLIT 77X108 STRL (DRAPES) ×12
DRAPE SURG ORHT 6 SPLT 77X108 (DRAPES) ×8 IMPLANT
DRAPE UTILITY XL STRL (DRAPES) ×4 IMPLANT
DRAPE WARM FLUID 44X44 (DRAPE) ×3 IMPLANT
DRSG PAD ABDOMINAL 8X10 ST (GAUZE/BANDAGES/DRESSINGS) ×6 IMPLANT
DRSG TEGADERM 4X4.75 (GAUZE/BANDAGES/DRESSINGS) ×8 IMPLANT
ELECT BLADE 4.0 EZ CLEAN MEGAD (MISCELLANEOUS) ×3
ELECT BLADE 6.5 EXT (BLADE) ×2 IMPLANT
ELECT CAUTERY BLADE 6.4 (BLADE) ×3 IMPLANT
ELECT COATED BLADE 2.86 ST (ELECTRODE) ×3 IMPLANT
ELECT REM PT RETURN 9FT ADLT (ELECTROSURGICAL) ×3
ELECTRODE BLDE 4.0 EZ CLN MEGD (MISCELLANEOUS) ×2 IMPLANT
ELECTRODE REM PT RTRN 9FT ADLT (ELECTROSURGICAL) ×2 IMPLANT
EVACUATOR SILICONE 100CC (DRAIN) ×6 IMPLANT
EXPANDER TISSUE MX FOURTE 300 (Prosthesis & Implant Plastic) IMPLANT
GAUZE SPONGE 4X4 12PLY STRL LF (GAUZE/BANDAGES/DRESSINGS) IMPLANT
GAUZE XEROFORM 5X9 LF (GAUZE/BANDAGES/DRESSINGS) IMPLANT
GLOVE BIO SURGEON STRL SZ 6 (GLOVE) ×10 IMPLANT
GOWN STRL REUS W/ TWL LRG LVL3 (GOWN DISPOSABLE) ×4 IMPLANT
GOWN STRL REUS W/TWL LRG LVL3 (GOWN DISPOSABLE) ×12
KIT BASIN OR (CUSTOM PROCEDURE TRAY) ×3 IMPLANT
KIT TURNOVER KIT B (KITS) ×3 IMPLANT
MARKER SKIN DUAL TIP RULER LAB (MISCELLANEOUS) ×3 IMPLANT
NDL HYPO 25GX1X1/2 BEV (NEEDLE) ×2 IMPLANT
NEEDLE HYPO 25GX1X1/2 BEV (NEEDLE) ×3 IMPLANT
NS IRRIG 1000ML POUR BTL (IV SOLUTION) ×6 IMPLANT
PACK GENERAL/GYN (CUSTOM PROCEDURE TRAY) ×3 IMPLANT
PAD ARMBOARD 7.5X6 YLW CONV (MISCELLANEOUS) ×9 IMPLANT
PEN SKIN MARKING BROAD (MISCELLANEOUS) ×3 IMPLANT
PENCIL BUTTON HOLSTER BLD 10FT (ELECTRODE) ×1 IMPLANT
PIN SAFETY STERILE (MISCELLANEOUS) ×2 IMPLANT
PUNCH BIOPSY 4MM (MISCELLANEOUS) ×3
PUNCH BIOPSY DERMAL 6MM STRL (MISCELLANEOUS) ×1 IMPLANT
PUNCH BIOPSY DISP 4 (MISCELLANEOUS) IMPLANT
SET ASEPTIC TRANSFER (MISCELLANEOUS) ×3 IMPLANT
SET COLLECT BLD 21X3/4 12 PB (MISCELLANEOUS) IMPLANT
SOL PREP POV-IOD 4OZ 10% (MISCELLANEOUS) ×3 IMPLANT
SPONGE LAP 18X18 RF (DISPOSABLE) ×1 IMPLANT
SPONGE LAP 18X18 X RAY DECT (DISPOSABLE) IMPLANT
STAPLER VISISTAT 35W (STAPLE) ×3 IMPLANT
STOCKINETTE IMPERVIOUS 9X36 MD (GAUZE/BANDAGES/DRESSINGS) ×1 IMPLANT
STRIP CLOSURE SKIN 1/2X4 (GAUZE/BANDAGES/DRESSINGS) ×6 IMPLANT
SUT CHROMIC 4 0 PS 2 18 (SUTURE) IMPLANT
SUT ETHILON 2 0 FS 18 (SUTURE) ×6 IMPLANT
SUT MNCRL AB 4-0 PS2 18 (SUTURE) ×6 IMPLANT
SUT PDS AB 2-0 CT2 27 (SUTURE) ×8 IMPLANT
SUT VIC AB 0 CT2 27 (SUTURE) IMPLANT
SUT VIC AB 3-0 SH 27 (SUTURE) ×3
SUT VIC AB 3-0 SH 27X BRD (SUTURE) ×4 IMPLANT
SUT VIC AB 3-0 SH 8-18 (SUTURE) ×1 IMPLANT
SUT VIC AB 4-0 PS2 18 (SUTURE) ×1 IMPLANT
SUT VICRYL 4-0 PS2 18IN ABS (SUTURE) IMPLANT
SUT VLOC 180 0 24IN GS25 (SUTURE) ×1 IMPLANT
SYR 50ML SLIP (SYRINGE) ×1 IMPLANT
SYR BULB IRRIGATION 50ML (SYRINGE) ×3 IMPLANT
SYR CONTROL 10ML LL (SYRINGE) ×3 IMPLANT
TISSUE EXPNDR MX FOURTE 300 (Prosthesis & Implant Plastic) ×3 IMPLANT
TOWEL OR 17X24 6PK STRL BLUE (TOWEL DISPOSABLE) ×3 IMPLANT
TOWEL OR 17X26 10 PK STRL BLUE (TOWEL DISPOSABLE) ×3 IMPLANT
TRAY FOLEY MTR SLVR 14FR STAT (SET/KITS/TRAYS/PACK) ×3 IMPLANT
TRAY FOLEY MTR SLVR 16FR STAT (SET/KITS/TRAYS/PACK) IMPLANT
TUBE CONNECTING 12X1/4 (SUCTIONS) ×5 IMPLANT
YANKAUER SUCT BULB TIP NO VENT (SUCTIONS) ×1 IMPLANT

## 2018-06-26 NOTE — Interval H&P Note (Signed)
History and Physical Interval Note:  06/26/2018 6:50 AM  [name]  has presented today for surgery, with the diagnosis of history breast cancer, acquired absence breast, history therapeutic radiation  The various methods of treatment have been discussed with the patient and family. After consideration of risks, benefits and other options for treatment, the patient has consented to  Procedure(s): RIGHT BREAST RECONSTRUCTION WITH PLACEMENT OF TISSUE EXPANDER (Right) LATISSIMUS FLAP TO RIGHT CHEST (Right) as a surgical intervention .  The patient's history has been reviewed, patient examined, no change in status, stable for surgery.  I have reviewed the patient's chart and labs.  Questions were answered to the patient's satisfaction.     Arnoldo Hooker Reyes [place]

## 2018-06-26 NOTE — Anesthesia Postprocedure Evaluation (Signed)
Anesthesia Post Note  Patient: DARITZA BREES  Procedure(s) Performed: RIGHT BREAST RECONSTRUCTION WITH PLACEMENT OF TISSUE EXPANDER (Right Breast) LATISSIMUS FLAP TO RIGHT CHEST (Right Chest)     Patient location during evaluation: PACU Anesthesia Type: General Level of consciousness: awake and alert Pain management: pain level controlled Vital Signs Assessment: post-procedure vital signs reviewed and stable Respiratory status: spontaneous breathing, nonlabored ventilation, respiratory function stable and patient connected to nasal cannula oxygen Cardiovascular status: blood pressure returned to baseline and stable Postop Assessment: no apparent nausea or vomiting Anesthetic complications: no    Last Vitals:  Vitals:   06/26/18 1116 06/26/18 1123  BP: (!) 160/101 (!) 158/95  Pulse: 74 73  Resp: 15 (!) 23  Temp:    SpO2: 100% 100%    Last Pain:  Vitals:   06/26/18 1115  TempSrc:   PainSc: 0-No pain                 Montez Hageman

## 2018-06-26 NOTE — Op Note (Signed)
Operative Note   DATE OF OPERATION: 1.20.20  LOCATION:  Main OR-observation  SURGICAL DIVISION: Plastic Surgery  PREOPERATIVE DIAGNOSES:  1. History breast cancer 2. Acquired absence breast 3. History therapeutic radiation  POSTOPERATIVE DIAGNOSES:  same  PROCEDURE:  1. Right breast reconstruction with tissue expander 2. Latissimus dorsi flap to right chest  SURGEON: [name] MD MBA  ASSISTANT: Eddie Dibbles RNFA student  ANESTHESIA:  General.   WER:15 ml  COMPLICATIONS: None immediate.   INDICATIONS FOR PROCEDURE:  The patient, [name], is a 78 y.o. female born on 1941/04/16, is here for right breast reconstruction following right mastectomy with adjuvant radiation. She initially underwent immediate expander reconstruction, course complicated by ulceration mastectomy flap, deflation expander, and exposure expander following radiation.   FINDINGS: Natrelle 133S MX - 11-T 300 ml tissue expander placed, initial fill volume 80 ml saline SN [id number]  DESCRIPTION OF PROCEDURE:  The patient was marked with the patient in the preoperative area to mark sternal notch, chest midline, anterior axillary lineand inframammary fold. The patient was taken to the operating room. SCDs were placed and IV antibiotics, SQ heparinwere administered. Foley catheter placed. Patient positioned into left lateral position.The patient's operative site was prepped and draped in a sterile fashion. A time out was performed and all information was confirmed to be correct.   Incision made in prior transverse chest mastectomy scar. Skin flaps elevated off pectoralis muscle. Dissection over pectoralis continued toward right axilla. Elliptical skin paddle designed right back and incision made around skin paddle.Skin and superficial fascia elevated off surface of latissimus muscle and subcutaneous tunnel dissected to axilla joining anterior breast cavity.Anterior border of latissimus identified and elevated.  Muscle divided inferiorly at superior iliac spine. Submuscular dissection completed toward midline back and toward origin. Flap rotated into anterior chest cavity. Back irrigated and hemostasis ensured. 15 Fr drain placed and secured with 2-0 nylon. 2-0 PDS used to placed quilting sutures from elevated skin flaps to chest wall. Exparel infiltrated. Incision closed with 0 V lock suture in superficial fascia and dermis. Skin closure completed with 4-0 monocryl subcuticular and tissue glue applied.  Patient repositioned into supine position. Patient re prepped and draped. Additional time out performed.   Breast ccavity was irrigated with solution containingAncef, gentamicin,and bacitracin. Hemostasis was ensured.Thoracodorsal nerve was identified and divided. The skin paddle overlying latissimus muscle was discarded. The muscle was oriented and sutured to pectoralis muscle and chest wall with 2-0 PDS suture in figure of eight fashion. A 19 Fr drain was placed in breast cavity and secured to skin with 2-0 nylon. Cavity irrigated with Betadine.The tissue expanderwasprepared and placed beneath latissimus muscle. The inferior latissimus was secured to superficial fascia with 2-0 PDS interrupted. Skin closure completedwith 3-0 vicryl in fascial layer and 4-0 vicryl in dermis. The two perforations were closed primarily with 3-0 and 4-0 vicryl in dermis. Skin closure completed with 4-0 monocryl subcuticular throughout and tissue adhesive applied.The expander port was identified and accessed, total 80 ml saline infiltrated.  Dry dressing and breast binder applied.  The patient was allowed to wake from anesthesia, extubated and taken to the recovery room in satisfactory condition.   SPECIMENS: none  DRAINS: 15 Fr JP in right back, 19 Fr JP in right breast reconstruction  [name], MD [facility] Plastic & Reconstructive Surgery [phone], pin [phone]

## 2018-06-26 NOTE — Anesthesia Procedure Notes (Signed)
Procedure Name: Intubation Date/Time: 06/26/2018 7:39 AM Performed by: Bryson Corona, CRNA Pre-anesthesia Checklist: Patient identified, Emergency Drugs available, Suction available and Patient being monitored Patient Re-evaluated:Patient Re-evaluated prior to induction Oxygen Delivery Method: Circle System Utilized Preoxygenation: Pre-oxygenation with 100% oxygen Induction Type: IV induction Ventilation: Mask ventilation without difficulty Laryngoscope Size: Mac and 3 Grade View: Grade I Tube type: Oral Tube size: 7.0 mm Number of attempts: 1 Airway Equipment and Method: Stylet Placement Confirmation: ETT inserted through vocal cords under direct vision,  positive ETCO2 and breath sounds checked- equal and bilateral Secured at: 20 cm Tube secured with: Tape Dental Injury: Teeth and Oropharynx as per pre-operative assessment

## 2018-06-26 NOTE — Transfer of Care (Signed)
Immediate Anesthesia Transfer of Care Note  Patient: [name]  Procedure(s) Performed: RIGHT BREAST RECONSTRUCTION WITH PLACEMENT OF TISSUE EXPANDER (Right Breast) LATISSIMUS FLAP TO RIGHT CHEST (Right Chest)  Patient Location: PACU  Anesthesia Type:General  Level of Consciousness: drowsy and patient cooperative  Airway & Oxygen Therapy: Patient Spontanous Breathing and Patient connected to nasal cannula oxygen  Post-op Assessment: Report given to RN and Post -op Vital signs reviewed and stable  Post vital signs: Reviewed and stable  Last Vitals:  Vitals Value Taken Time  BP 136/119 06/26/2018 11:05 AM  Temp    Pulse 109 06/26/2018 11:04 AM  Resp 13 06/26/2018 11:05 AM  SpO2 93 % 06/26/2018 11:04 AM  Vitals shown include unvalidated device data.  Last Pain:  Vitals:   06/26/18 0700  TempSrc:   PainSc: 0-No pain      Patients Stated Pain Goal: 3 (42/10/31 2811)  Complications: No apparent anesthesia complications

## 2018-06-27 ENCOUNTER — Encounter (HOSPITAL_COMMUNITY): Payer: Self-pay | Admitting: Plastic Surgery

## 2018-06-27 NOTE — Progress Notes (Signed)
  Plastic Surgery POD#1 right breast reconstruction with tissue expander latissimus dorsi flap to right chest  Temp:  [97.3 F (36.3 C)-97.8 F (36.6 C)] 97.6 F (36.4 C) (01/21 0543) Pulse Rate:  [63-82] 71 (01/21 0543) Resp:  [14-27] 16 (01/21 0543) BP: (104-168)/(74-101) 112/86 (01/21 0543) SpO2:  [94 %-100 %] 95 % (01/21 0543)   PO 200 JP 130/130  Alert NAD Received one dose IV dilaudid overnight walked back from BR independent per patient  PE  Chest soft incision dry intact Back flat incision dry intact JPs serosanguinous   A/P Lovenox px dose, plan resume Xarelto 5 d post op Ambulate, IS Likely home tomorrow  [name], MD [facility] Plastic & Reconstructive Surgery [phone], pin [phone]

## 2018-06-28 NOTE — Discharge Summary (Signed)
Physician Discharge Summary  Patient ID: [name] MRN: [id number] DOB/AGE: [birth year] 76 y.o.  Admit date: 06/26/2018 Discharge date: 06/28/2018  Admission Diagnoses: History breast cancer, acquired absence breast, history therapeutic radiation  Discharge Diagnoses:  same  Discharged Condition: stable  Hospital Course: Postoperatively patient did well, tolerating diet on POD0. She was able to ambulate on POD#1 and tolerate oral pain medication. She was maintained on prophylactic Lovenox while in hospital with plan to resume Xarelto on POD#5.  Treatments: surgery: right breast reconstruction with tissue expander latissimus dorsi flap 1.20.20  Discharge Exam: Blood pressure (!) 108/54, pulse 60, temperature (!) 97.5 F (36.4 C), temperature source Oral, resp. rate 16, height 5' 0.5" (1.537 m), weight 68 kg, SpO2 97 %. Incision/Wound: chest soft incision dry intact back flat incision dry intact drains serosanguinous  Disposition: Discharge disposition: 01-Home or Self Care       Discharge Instructions    Call MD for:  redness, tenderness, or signs of infection (pain, swelling, bleeding, redness, odor or green/yellow discharge around incision site)   Complete by:  As directed    Call MD for:  temperature >100.5   Complete by:  As directed    Discharge instructions   Complete by:  As directed    Ok to remove dressings and shower am 1.22.20. Soap and water ok, pat incisions dry. No creams or ointments over incisions. Do not let drains dangle in shower, attach to lanyard or similar.Strip and record drains twice daily and bring log to clinic visit.  Breast binder or soft compression bra all other times.  Ok to raise arms above shoulders for bathing and dressing.  No house yard work or exercise until cleared by MD. No sex until cleared by MD  MAY RESUME XARELTO Saturday 1.25.20   Driving Restrictions   Complete by:  As directed    No driving for 2 weeks then no driving if  taking narcotics   Lifting restrictions   Complete by:  As directed    No lifting > 5 lbs until cleared by MD   Resume previous diet   Complete by:  As directed      Allergies as of 06/28/2018      Reactions   Chlorhexidine Gluconate Hives, Swelling, Rash     "ChloraPrep "   Doxycycline Nausea And Vomiting   Nsaids    avoid due to gerd   Tylenol [acetaminophen]    Avoid due to GERD   Codeine Nausea Only      Medication List    STOP taking these medications   traMADol 50 MG tablet Commonly known as:  ULTRAM     TAKE these medications   albuterol 108 (90 Base) MCG/ACT inhaler Commonly known as:  PROVENTIL HFA;VENTOLIN HFA Inhale 2 puffs into the lungs every 6 (six) hours as needed for wheezing or shortness of breath. For wheezing/shortness of breath   ALPRAZolam 0.5 MG tablet Commonly known as:  XANAX Take 0.5 mg by mouth daily as needed for anxiety.   AZO TABS PO Take 1 tablet by mouth daily as needed (buring/pain).   BREO ELLIPTA 100-25 MCG/INH Aepb Generic drug:  fluticasone furoate-vilanterol Inhale 1 puff into the lungs daily.   ciprofloxacin 500 MG tablet Commonly known as:  CIPRO Take 1 tablet (500 mg total) by mouth 2 (two) times daily for 5 days.   desonide 0.05 % cream Commonly known as:  DESOWEN Apply 1 application topically 2 (two) times daily as needed (for skin irritation.).  diclofenac sodium 1 % Gel Commonly known as:  VOLTAREN Apply 2 g topically daily as needed (pain).   EPIPEN 2-PAK 0.3 mg/0.3 mL Soaj injection Generic drug:  EPINEPHrine Inject 0.3 mg into the muscle once.   letrozole 2.5 MG tablet Commonly known as:  FEMARA Take 1 tablet (2.5 mg total) by mouth daily.   levocetirizine 5 MG tablet Commonly known as:  XYZAL Take 5 mg by mouth at bedtime.   methocarbamol 500 MG tablet Commonly known as:  ROBAXIN Take 1 tablet (500 mg total) by mouth every 8 (eight) hours as needed for muscle spasms. What changed:  when to take  this   oxyCODONE 5 MG immediate release tablet Commonly known as:  ROXICODONE Take 1 tablet (5 mg total) by mouth every 4 (four) hours as needed.   Potassium 99 MG Tabs Take 99 mg by mouth daily.   propranolol ER 80 MG 24 hr capsule Commonly known as:  INDERAL LA Take 80 mg by mouth daily.   ranitidine 150 MG capsule Commonly known as:  ZANTAC Take 1 capsule (150 mg total) by mouth daily. What changed:    when to take this  reasons to take this   RESTASIS 0.05 % ophthalmic emulsion Generic drug:  cycloSPORINE Place 1 drop into both eyes daily.   rivaroxaban 20 MG Tabs tablet Commonly known as:  XARELTO Take 20 mg by mouth daily with supper.   SYSTANE OP Place 1 drop into both eyes daily.   triamterene-hydrochlorothiazide 37.5-25 MG tablet Commonly known as:  MAXZIDE-25 Take 1 tablet by mouth every morning.   venlafaxine XR 75 MG 24 hr capsule Commonly known as:  EFFEXOR-XR TAKE 1 CAPSULE (75 MG TOTAL) BY MOUTH DAILY WITH BREAKFAST.      Follow-up Information    [name], MD In 1 week.   Specialty:  Plastic Surgery Why:  as scheduled Contact information: [address] [place] [address] [phone]           Signed: [name] 06/28/2018, 7:39 AM

## 2018-07-15 IMAGING — CR DG CHEST 2V
2 series · 2 of 2 positions shown · non-contrast
Comparison: Chest x-ray of 04/03/2015

CLINICAL DATA: Cough, congestion, worsening shortness of breath,
history of asthma

EXAM:
CHEST  2 VIEW

[w chest pa]
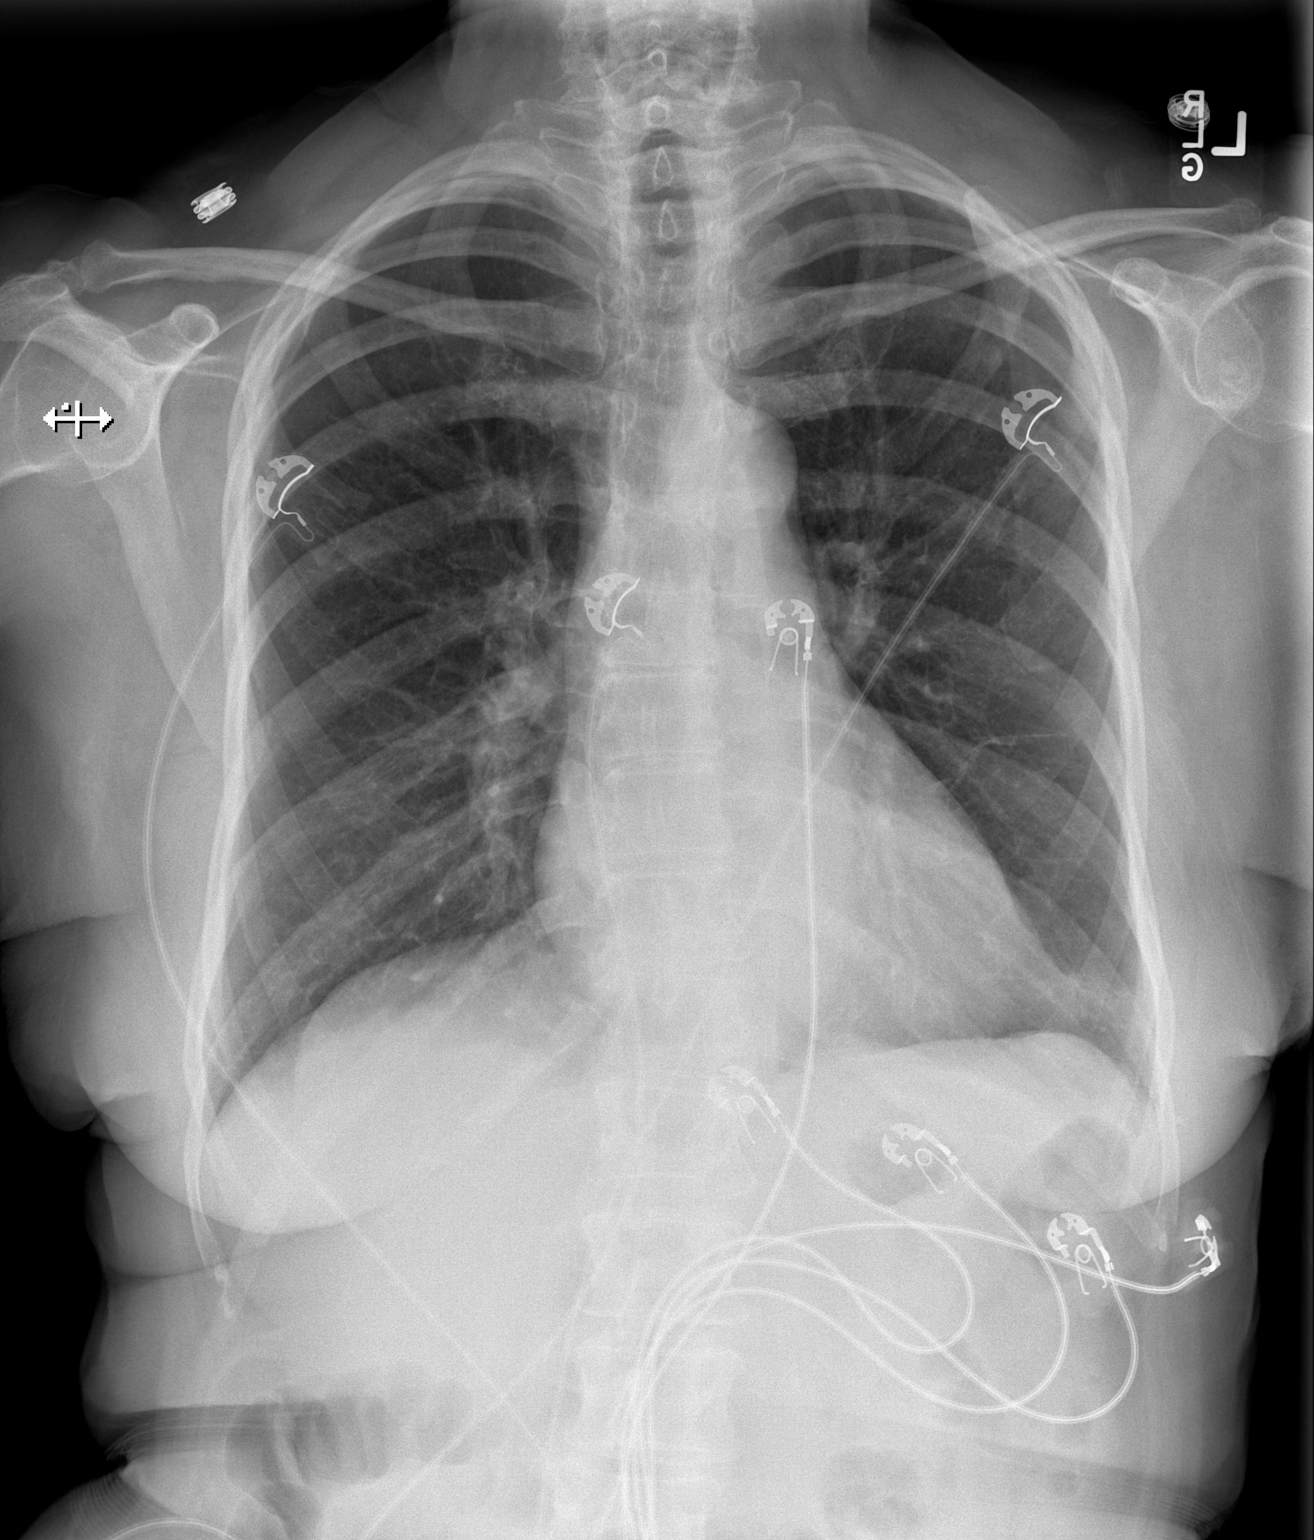

[w chest lat]
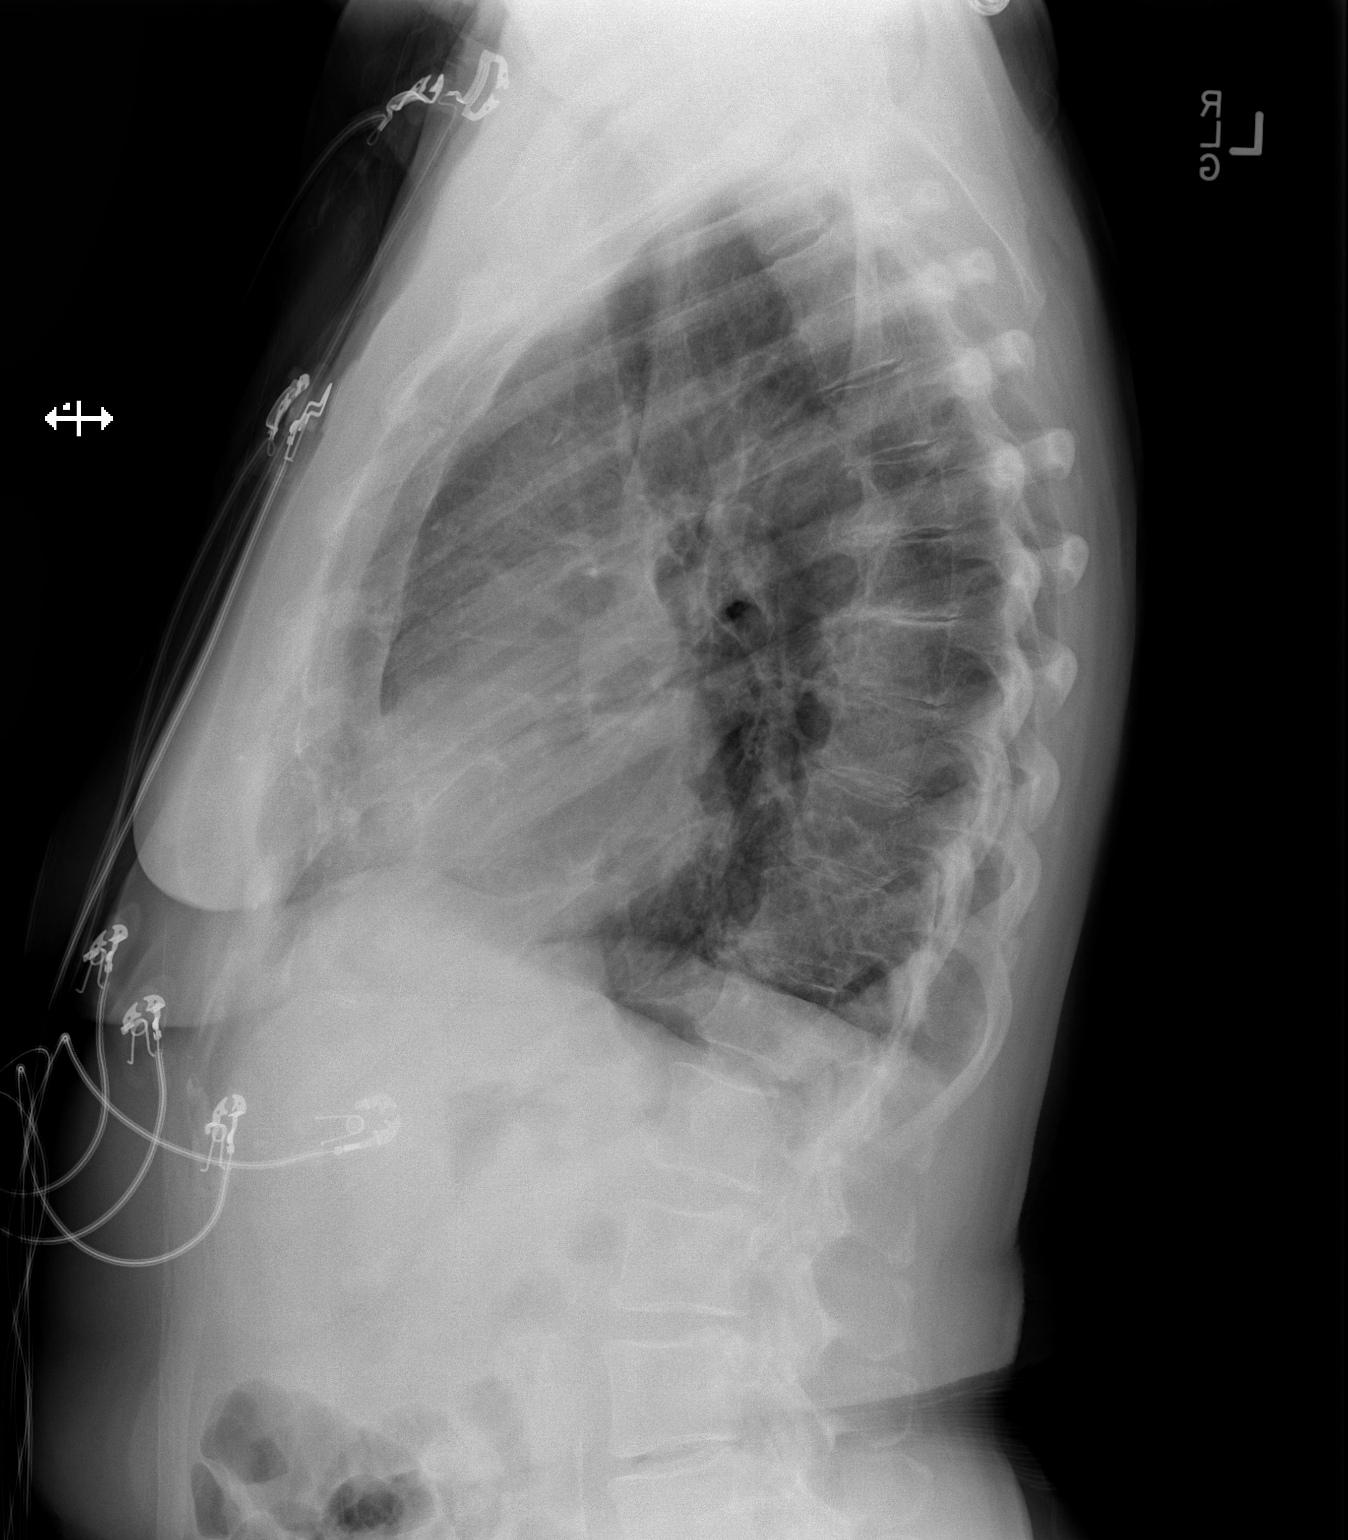

[2 of 2 positions shown; findings below may reference images not displayed]

FINDINGS: No active infiltrate or effusion is seen. Mediastinal and hilar
contours are unremarkable. The heart is within upper limits of
normal and stable. There are degenerative changes in the mid to
lower thoracic spine.
IMPRESSION: No active cardiopulmonary disease.

## 2018-07-24 ENCOUNTER — Encounter (HOSPITAL_BASED_OUTPATIENT_CLINIC_OR_DEPARTMENT_OTHER): Payer: Self-pay | Admitting: *Deleted

## 2018-07-24 ENCOUNTER — Other Ambulatory Visit: Payer: Self-pay

## 2018-07-24 NOTE — Progress Notes (Signed)
PT's previous anesthesia notes, pmh and cardiology notes reviewed with Dr Lanetta Inch. Pt may hold xarelto  up to 3 days per card with no need to bridge with lovenox.

## 2018-07-28 ENCOUNTER — Ambulatory Visit (HOSPITAL_BASED_OUTPATIENT_CLINIC_OR_DEPARTMENT_OTHER): Admission: RE | Admit: 2018-07-28 | Payer: Medicare Other | Source: Home / Self Care | Admitting: Plastic Surgery

## 2018-07-28 SURGERY — DEBRIDEMENT, WOUND, WITH CLOSURE
Anesthesia: General | Site: Leg Lower | Laterality: Right

## 2018-09-22 ENCOUNTER — Telehealth: Payer: Self-pay | Admitting: Cardiology

## 2018-09-22 NOTE — Telephone Encounter (Signed)
°*  STAT* If patient is at the pharmacy, call can be transferred to refill team.   1. Which medications need to be refilled? (please list name of each medication and dose if known) XARELTO 20MG  2. Which pharmacy/location (including street and city if local pharmacy) is medication to be sent to? [facility] [address]  3. Do they need a 30 day or 90 day supply? [place]

## 2018-09-22 NOTE — Telephone Encounter (Signed)
°*  STAT* If patient is at the pharmacy, call can be transferred to refill team.   1. Which medications need to be refilled? (please list name of each medication and dose if known)XARELTO 20MG   2. Which pharmacy/location (including street and city if local pharmacy) is medication to be sent to?  [id number] [place], [place] [phone] (Phone) [phone] (Fax)    3. Do they need a 30 day or 90 day supply? [place]

## 2018-09-26 ENCOUNTER — Other Ambulatory Visit: Payer: Self-pay | Admitting: Oncology

## 2018-09-28 ENCOUNTER — Other Ambulatory Visit: Payer: Self-pay | Admitting: Cardiology

## 2018-09-28 NOTE — Telephone Encounter (Signed)
°*  STAT* If patient is at the pharmacy, call can be transferred to refill team.   1. Which medications need to be refilled? (please list name of each medication and dose if known) rivaroxaban (XARELTO) 20 MG TABS    2. Which pharmacy/location (including street and city if local pharmacy) is medication to be sent to?  [id number] [place], [place] [phone] (Phone) [phone] (Fax)    3. Do they need a 30 day or 90 day supply? 90 day

## 2018-09-29 MED ORDER — RIVAROXABAN 20 MG PO TABS: 20.0000 mg | ORAL_TABLET | Freq: Every day | ORAL | 0 refills | Status: DC

## 2018-09-29 NOTE — Telephone Encounter (Signed)
[facility] Patient is over due to follow up with a Cardiologist and should schedule an appointment before further refills are given

## 2018-10-25 ENCOUNTER — Other Ambulatory Visit: Payer: Self-pay | Admitting: Cardiology

## 2018-11-01 ENCOUNTER — Other Ambulatory Visit: Payer: Self-pay | Admitting: Cardiology

## 2018-11-02 NOTE — Telephone Encounter (Signed)
Refill request received for xarelto. Requires cardiology visit for further refills. Left voice message requesting call back to [phone]

## 2018-11-07 ENCOUNTER — Encounter (HOSPITAL_BASED_OUTPATIENT_CLINIC_OR_DEPARTMENT_OTHER): Payer: Self-pay | Admitting: *Deleted

## 2018-11-07 ENCOUNTER — Other Ambulatory Visit: Payer: Self-pay

## 2018-11-09 NOTE — H&P (Signed)
  Subjective:     Patient ID: [name] is a 78 y.o. female.  HPI  5 months post op right LD flap with TE placement.  Plan implant exchange, left breast mastopexy and augmentation.  Presented following annual diagnostic MMG with a 3.5 cm spiculated mass in the right breast UIQ. [place] confirmed a 3.5 cm solid mass in the right UIQ, with no abnormalities noted in the right axilla. 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 MRI demonstrated known 2.6 cm mass UIQ. Additional right UOQ 0.5 x 0.7 x 0.3 cm mass noted. 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   Oncotype 21/intermediate risk. Completed adjuvant radiation 11.28.18.   Right mastectomy 397 g She desires C cup.  MMG 12/2017 normal  Genetics negative.     Objective:   Physical Exam  Cardiovascular: Normal rate and normal heart sounds.  Pulmonary/Chest: Effort normal and breath sounds normal.   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   Has elected for saline implants, plan smooth round. Reviewed size guided by chest width and will be limited by radiated chest- she desires larger than present in expander, "large as possible." Over left breast discussed anchor type scar possible drain. Reviewed implants are not permanent devices, risks rupture, need for additional surgery, displacement reviewed.  Will stop Xarelto 3 days prior to surgery. Additional risks including but not limited to hematoma, seroma, DVT/PE, cardiopulmonary  complications, asymmetry, wound healing problems, unacceptable cosmetic appearance, damage to deeper structures, infection reviewed.  Discussed risk COVID infectionthrough this elective surgery. Patient will receive COVID testing prior to surgery. Discussed even if patient receivesa negative test result, the tests in some cases may fail to detect the virus or patient maycontract COVID after the test.COVID 19 infectionbefore/during/aftersurgery may result in lead to a higher chance of complication and death.  Plan overnight stay. Will give rx for oxycodone and antibiotic at time of surgery.  Natrelle 133S MX - 11-T 300 ml tissue expander placed, 290 ml saline fill volume  [name], MD [facility] Plastic & Reconstructive Surgery [phone], pin [phone]

## 2018-11-10 ENCOUNTER — Encounter (HOSPITAL_BASED_OUTPATIENT_CLINIC_OR_DEPARTMENT_OTHER)
Admission: RE | Admit: 2018-11-10 | Discharge: 2018-11-10 | Disposition: A | Discharge status: Home / Self Care | Payer: Medicare Other | Source: Ambulatory Visit | Attending: Plastic Surgery | Admitting: Plastic Surgery

## 2018-11-10 ENCOUNTER — Other Ambulatory Visit (HOSPITAL_COMMUNITY)
Admission: RE | Admit: 2018-11-10 | Discharge: 2018-11-10 | Disposition: A | Discharge status: Home / Self Care | Payer: Medicare Other | Source: Ambulatory Visit | Attending: Plastic Surgery | Admitting: Plastic Surgery

## 2018-11-10 ENCOUNTER — Other Ambulatory Visit: Payer: Self-pay

## 2018-11-10 DIAGNOSIS — E669 Obesity, unspecified: Secondary | ICD-10-CM | POA: Diagnosis not present

## 2018-11-10 DIAGNOSIS — Z6831 Body mass index (BMI) 31.0-31.9, adult: Secondary | ICD-10-CM | POA: Diagnosis not present

## 2018-11-10 DIAGNOSIS — Z923 Personal history of irradiation: Secondary | ICD-10-CM | POA: Diagnosis not present

## 2018-11-10 DIAGNOSIS — Z1159 Encounter for screening for other viral diseases: Secondary | ICD-10-CM | POA: Diagnosis present

## 2018-11-10 DIAGNOSIS — F419 Anxiety disorder, unspecified: Secondary | ICD-10-CM | POA: Diagnosis not present

## 2018-11-10 DIAGNOSIS — Z853 Personal history of malignant neoplasm of breast: Secondary | ICD-10-CM | POA: Diagnosis present

## 2018-11-10 DIAGNOSIS — I4891 Unspecified atrial fibrillation: Secondary | ICD-10-CM | POA: Diagnosis not present

## 2018-11-10 DIAGNOSIS — J45909 Unspecified asthma, uncomplicated: Secondary | ICD-10-CM | POA: Diagnosis not present

## 2018-11-10 DIAGNOSIS — K219 Gastro-esophageal reflux disease without esophagitis: Secondary | ICD-10-CM | POA: Diagnosis not present

## 2018-11-10 DIAGNOSIS — M1990 Unspecified osteoarthritis, unspecified site: Secondary | ICD-10-CM | POA: Diagnosis not present

## 2018-11-10 DIAGNOSIS — I10 Essential (primary) hypertension: Secondary | ICD-10-CM | POA: Diagnosis not present

## 2018-11-10 DIAGNOSIS — Z7901 Long term (current) use of anticoagulants: Secondary | ICD-10-CM | POA: Diagnosis not present

## 2018-11-10 DIAGNOSIS — Z170 Estrogen receptor positive status [ER+]: Secondary | ICD-10-CM | POA: Diagnosis not present

## 2018-11-10 DIAGNOSIS — Z421 Encounter for breast reconstruction following mastectomy: Secondary | ICD-10-CM | POA: Diagnosis not present

## 2018-11-10 DIAGNOSIS — D649 Anemia, unspecified: Secondary | ICD-10-CM | POA: Diagnosis not present

## 2018-11-10 LAB — BASIC METABOLIC PANEL
Anion gap: 10 (ref 5–15)
BUN: 12 mg/dL (ref 8–23)
CO2: 27 mmol/L (ref 22–32)
Calcium: 9.6 mg/dL (ref 8.9–10.3)
Chloride: 94 mmol/L — ABNORMAL LOW (ref 98–111)
Creatinine, Ser: 0.74 mg/dL (ref 0.44–1.00)
GFR calc Af Amer: >60 mL/min (ref >60)
GFR calc non Af Amer: >60 mL/min (ref >60)
Glucose, Bld: 97 mg/dL (ref 70–99)
Potassium: 4.2 mmol/L (ref 3.5–5.1)
Sodium: 131 mmol/L — ABNORMAL LOW (ref 135–145)

## 2018-11-10 LAB — CBC WITH DIFFERENTIAL/PLATELET
Abs Immature Granulocytes: 0.02 10*3/uL (ref 0.00–0.07)
Basophils Absolute: 0.1 10*3/uL (ref 0.0–0.1)
Basophils Relative: 1 %
Eosinophils Absolute: 0.2 10*3/uL (ref 0.0–0.5)
Eosinophils Relative: 2 %
HCT: 41.1 % (ref 36.0–46.0)
Hemoglobin: 13.7 g/dL (ref 12.0–15.0)
Immature Granulocytes: 0 %
Lymphocytes Relative: 22 %
Lymphs Abs: 1.8 10*3/uL (ref 0.7–4.0)
MCH: 32.4 pg (ref 26.0–34.0)
MCHC: 33.3 g/dL (ref 30.0–36.0)
MCV: 97.2 fL (ref 80.0–100.0)
Monocytes Absolute: 1.2 10*3/uL — ABNORMAL HIGH (ref 0.1–1.0)
Monocytes Relative: 15 %
Neutro Abs: 4.9 10*3/uL (ref 1.7–7.7)
Neutrophils Relative %: 60 %
Platelets: 235 10*3/uL (ref 150–400)
RBC: 4.23 MIL/uL (ref 3.87–5.11)
RDW: 12.9 % (ref 11.5–15.5)
WBC: 8.1 10*3/uL (ref 4.0–10.5)
nRBC: 0 % (ref 0.0–0.2)

## 2018-11-10 NOTE — Progress Notes (Signed)
Ensure pre surgery drink given with instructions to complete by 0430 dos, pt verbalized understanding. 

## 2018-11-11 LAB — NOVEL CORONAVIRUS, NAA (HOSP ORDER, SEND-OUT TO REF LAB; TAT 18-24 HRS): SARS-CoV-2, NAA: NOT DETECTED

## 2018-11-14 ENCOUNTER — Encounter (HOSPITAL_BASED_OUTPATIENT_CLINIC_OR_DEPARTMENT_OTHER): Payer: Self-pay | Admitting: *Deleted

## 2018-11-14 ENCOUNTER — Other Ambulatory Visit: Payer: Self-pay

## 2018-11-14 ENCOUNTER — Encounter (HOSPITAL_BASED_OUTPATIENT_CLINIC_OR_DEPARTMENT_OTHER)
Admission: RE | Disposition: A | Discharge status: Home / Self Care | Payer: Self-pay | Source: Home / Self Care | Attending: Plastic Surgery

## 2018-11-14 ENCOUNTER — Ambulatory Visit (HOSPITAL_BASED_OUTPATIENT_CLINIC_OR_DEPARTMENT_OTHER): Payer: Medicare Other | Admitting: Anesthesiology

## 2018-11-14 ENCOUNTER — Ambulatory Visit (HOSPITAL_BASED_OUTPATIENT_CLINIC_OR_DEPARTMENT_OTHER)
Admission: RE | Admit: 2018-11-14 | Discharge: 2018-11-15 | Disposition: A | Discharge status: Home / Self Care | Payer: Medicare Other | Attending: Plastic Surgery | Admitting: Plastic Surgery

## 2018-11-14 DIAGNOSIS — E669 Obesity, unspecified: Secondary | ICD-10-CM | POA: Insufficient documentation

## 2018-11-14 DIAGNOSIS — Z853 Personal history of malignant neoplasm of breast: Secondary | ICD-10-CM | POA: Diagnosis not present

## 2018-11-14 DIAGNOSIS — I4891 Unspecified atrial fibrillation: Secondary | ICD-10-CM | POA: Insufficient documentation

## 2018-11-14 DIAGNOSIS — F419 Anxiety disorder, unspecified: Secondary | ICD-10-CM | POA: Insufficient documentation

## 2018-11-14 DIAGNOSIS — Z421 Encounter for breast reconstruction following mastectomy: Secondary | ICD-10-CM | POA: Insufficient documentation

## 2018-11-14 DIAGNOSIS — Z923 Personal history of irradiation: Secondary | ICD-10-CM | POA: Diagnosis not present

## 2018-11-14 DIAGNOSIS — I10 Essential (primary) hypertension: Secondary | ICD-10-CM | POA: Insufficient documentation

## 2018-11-14 DIAGNOSIS — M1990 Unspecified osteoarthritis, unspecified site: Secondary | ICD-10-CM | POA: Insufficient documentation

## 2018-11-14 DIAGNOSIS — Z170 Estrogen receptor positive status [ER+]: Secondary | ICD-10-CM | POA: Insufficient documentation

## 2018-11-14 DIAGNOSIS — D649 Anemia, unspecified: Secondary | ICD-10-CM | POA: Insufficient documentation

## 2018-11-14 DIAGNOSIS — J45909 Unspecified asthma, uncomplicated: Secondary | ICD-10-CM | POA: Insufficient documentation

## 2018-11-14 DIAGNOSIS — Z7901 Long term (current) use of anticoagulants: Secondary | ICD-10-CM | POA: Insufficient documentation

## 2018-11-14 DIAGNOSIS — K219 Gastro-esophageal reflux disease without esophagitis: Secondary | ICD-10-CM | POA: Insufficient documentation

## 2018-11-14 DIAGNOSIS — Z6831 Body mass index (BMI) 31.0-31.9, adult: Secondary | ICD-10-CM | POA: Insufficient documentation

## 2018-11-14 HISTORY — DX: Actinic keratosis: L57.0

## 2018-11-14 HISTORY — PX: BREAST ENHANCEMENT SURGERY: SHX7

## 2018-11-14 HISTORY — PX: MASTOPEXY: SHX5358

## 2018-11-14 HISTORY — PX: REMOVAL OF TISSUE EXPANDER AND PLACEMENT OF IMPLANT: SHX6457

## 2018-11-14 SURGERY — REMOVAL, TISSUE EXPANDER, BREAST, WITH IMPLANT INSERTION
Anesthesia: General | Site: Breast | Laterality: Right

## 2018-11-14 MED ORDER — LIDOCAINE 2% (20 MG/ML) 5 ML SYRINGE
INTRAMUSCULAR | Status: DC | PRN
  Administered 2018-11-14: 50 mg via INTRAVENOUS

## 2018-11-14 MED ORDER — CEFAZOLIN SODIUM-DEXTROSE 2-4 GM/100ML-% IV SOLN
2.0000 g | INTRAVENOUS | Status: AC
  Administered 2018-11-14: 08:00:00 2 g via INTRAVENOUS

## 2018-11-14 MED ORDER — METOCLOPRAMIDE HCL 5 MG/ML IJ SOLN: 10.0000 mg | Freq: Once | INTRAMUSCULAR | Status: DC | PRN

## 2018-11-14 MED ORDER — FLUTICASONE FUROATE-VILANTEROL 100-25 MCG/INH IN AEPB: 1.0000 | INHALATION_SPRAY | Freq: Every day | RESPIRATORY_TRACT | Status: DC

## 2018-11-14 MED ORDER — GABAPENTIN 300 MG PO CAPS
ORAL_CAPSULE | ORAL | Status: AC
  Filled 2018-11-14: qty 1

## 2018-11-14 MED ORDER — SODIUM CHLORIDE 0.9 % IV SOLN
INTRAVENOUS | Status: DC | PRN
  Administered 2018-11-14: 100 mL
  Administered 2018-11-14: 900 mL

## 2018-11-14 MED ORDER — ENOXAPARIN SODIUM 40 MG/0.4ML ~~LOC~~ SOLN
40.0000 mg | SUBCUTANEOUS | Status: DC
  Administered 2018-11-15: 40 mg via SUBCUTANEOUS
  Filled 2018-11-14: qty 0.4

## 2018-11-14 MED ORDER — FENTANYL CITRATE (PF) 100 MCG/2ML IJ SOLN
INTRAMUSCULAR | Status: AC
  Filled 2018-11-14: qty 2

## 2018-11-14 MED ORDER — FENTANYL CITRATE (PF) 100 MCG/2ML IJ SOLN: 50.0000 ug | INTRAMUSCULAR | Status: DC | PRN

## 2018-11-14 MED ORDER — ONDANSETRON HCL 4 MG/2ML IJ SOLN
INTRAMUSCULAR | Status: AC
  Filled 2018-11-14: qty 2

## 2018-11-14 MED ORDER — METHOCARBAMOL 500 MG PO TABS
500.0000 mg | ORAL_TABLET | Freq: Three times a day (TID) | ORAL | Status: DC | PRN
  Administered 2018-11-14 – 2018-11-15 (×2): 500 mg via ORAL
  Filled 2018-11-14 (×2): qty 1

## 2018-11-14 MED ORDER — BUPIVACAINE HCL (PF) 0.25 % IJ SOLN
INTRAMUSCULAR | Status: AC
  Filled 2018-11-14: qty 30

## 2018-11-14 MED ORDER — CEFAZOLIN SODIUM-DEXTROSE 2-4 GM/100ML-% IV SOLN
INTRAVENOUS | Status: AC
  Filled 2018-11-14: qty 100

## 2018-11-14 MED ORDER — BUPIVACAINE-EPINEPHRINE 0.25% -1:200000 IJ SOLN
INTRAMUSCULAR | Status: DC | PRN
  Administered 2018-11-14: 20 mL

## 2018-11-14 MED ORDER — ONDANSETRON 4 MG PO TBDP: 4.0000 mg | ORAL_TABLET | Freq: Four times a day (QID) | ORAL | Status: DC | PRN

## 2018-11-14 MED ORDER — PROPOFOL 10 MG/ML IV BOLUS
INTRAVENOUS | Status: AC
  Filled 2018-11-14: qty 20

## 2018-11-14 MED ORDER — LACTATED RINGERS IV SOLN
INTRAVENOUS | Status: DC
  Administered 2018-11-14 (×3): via INTRAVENOUS

## 2018-11-14 MED ORDER — MEPERIDINE HCL 25 MG/ML IJ SOLN: 6.2500 mg | INTRAMUSCULAR | Status: DC | PRN

## 2018-11-14 MED ORDER — PHENYLEPHRINE HCL (PRESSORS) 10 MG/ML IV SOLN
INTRAVENOUS | Status: DC | PRN
  Administered 2018-11-14 (×3): 80 ug via INTRAVENOUS

## 2018-11-14 MED ORDER — TRIAMTERENE-HCTZ 37.5-25 MG PO TABS: 1.0000 | ORAL_TABLET | Freq: Every morning | ORAL | Status: DC

## 2018-11-14 MED ORDER — VENLAFAXINE HCL ER 75 MG PO CP24
75.0000 mg | ORAL_CAPSULE | Freq: Every day | ORAL | Status: DC
  Administered 2018-11-15: 75 mg via ORAL

## 2018-11-14 MED ORDER — MIDAZOLAM HCL 2 MG/2ML IJ SOLN: 1.0000 mg | INTRAMUSCULAR | Status: DC | PRN

## 2018-11-14 MED ORDER — GABAPENTIN 300 MG PO CAPS
300.0000 mg | ORAL_CAPSULE | ORAL | Status: AC
  Administered 2018-11-14: 300 mg via ORAL

## 2018-11-14 MED ORDER — DEXAMETHASONE SODIUM PHOSPHATE 10 MG/ML IJ SOLN
INTRAMUSCULAR | Status: AC
  Filled 2018-11-14: qty 1

## 2018-11-14 MED ORDER — ACETAMINOPHEN 500 MG PO TABS: 1000.0000 mg | ORAL_TABLET | ORAL | Status: DC

## 2018-11-14 MED ORDER — SUCCINYLCHOLINE CHLORIDE 20 MG/ML IJ SOLN
INTRAMUSCULAR | Status: DC | PRN
  Administered 2018-11-14: 100 mg via INTRAVENOUS

## 2018-11-14 MED ORDER — SODIUM CHLORIDE 0.9 % IV SOLN
INTRAVENOUS | Status: DC | PRN
  Administered 2018-11-14: 08:00:00 25 ug/min via INTRAVENOUS

## 2018-11-14 MED ORDER — DEXAMETHASONE SODIUM PHOSPHATE 4 MG/ML IJ SOLN
INTRAMUSCULAR | Status: DC | PRN
  Administered 2018-11-14: 10 mg via INTRAVENOUS

## 2018-11-14 MED ORDER — DIPHENHYDRAMINE HCL 12.5 MG/5ML PO ELIX: 12.5000 mg | ORAL_SOLUTION | Freq: Four times a day (QID) | ORAL | Status: DC | PRN

## 2018-11-14 MED ORDER — POLYETHYL GLYCOL-PROPYL GLYCOL 0.4-0.3 % OP GEL: Freq: Every day | OPHTHALMIC | Status: DC

## 2018-11-14 MED ORDER — SCOPOLAMINE 1 MG/3DAYS TD PT72: 1.0000 | MEDICATED_PATCH | Freq: Once | TRANSDERMAL | Status: DC | PRN

## 2018-11-14 MED ORDER — CEFAZOLIN SODIUM-DEXTROSE 1-4 GM/50ML-% IV SOLN
1.0000 g | Freq: Three times a day (TID) | INTRAVENOUS | Status: AC
  Administered 2018-11-14 – 2018-11-15 (×3): 1 g via INTRAVENOUS
  Filled 2018-11-14 (×3): qty 50

## 2018-11-14 MED ORDER — FENTANYL CITRATE (PF) 100 MCG/2ML IJ SOLN
INTRAMUSCULAR | Status: DC | PRN
  Administered 2018-11-14 (×3): 25 ug via INTRAVENOUS
  Administered 2018-11-14: 100 ug via INTRAVENOUS
  Administered 2018-11-14: 25 ug via INTRAVENOUS

## 2018-11-14 MED ORDER — OXYCODONE HCL 5 MG PO TABS: 5.0000 mg | ORAL_TABLET | Freq: Once | ORAL | Status: DC | PRN

## 2018-11-14 MED ORDER — PROPOFOL 10 MG/ML IV BOLUS
INTRAVENOUS | Status: DC | PRN
  Administered 2018-11-14: 100 mg via INTRAVENOUS
  Administered 2018-11-14 (×2): 50 mg via INTRAVENOUS

## 2018-11-14 MED ORDER — OXYCODONE HCL 5 MG/5ML PO SOLN: 5.0000 mg | Freq: Once | ORAL | Status: DC | PRN

## 2018-11-14 MED ORDER — FENTANYL CITRATE (PF) 100 MCG/2ML IJ SOLN
25.0000 ug | INTRAMUSCULAR | Status: DC | PRN
  Administered 2018-11-14: 50 ug via INTRAVENOUS
  Administered 2018-11-14: 25 ug via INTRAVENOUS

## 2018-11-14 MED ORDER — GLYCOPYRROLATE 0.2 MG/ML IJ SOLN
INTRAMUSCULAR | Status: DC | PRN
  Administered 2018-11-14 (×2): 0.1 mg via INTRAVENOUS

## 2018-11-14 MED ORDER — SUGAMMADEX SODIUM 200 MG/2ML IV SOLN
INTRAVENOUS | Status: DC | PRN
  Administered 2018-11-14: 200 mg via INTRAVENOUS

## 2018-11-14 MED ORDER — BUPIVACAINE-EPINEPHRINE (PF) 0.25% -1:200000 IJ SOLN
INTRAMUSCULAR | Status: AC
  Filled 2018-11-14: qty 30

## 2018-11-14 MED ORDER — FAMOTIDINE 20 MG PO TABS: 20.0000 mg | ORAL_TABLET | Freq: Every day | ORAL | Status: DC

## 2018-11-14 MED ORDER — DIPHENHYDRAMINE HCL 50 MG/ML IJ SOLN: 12.5000 mg | Freq: Four times a day (QID) | INTRAMUSCULAR | Status: DC | PRN

## 2018-11-14 MED ORDER — LIDOCAINE 2% (20 MG/ML) 5 ML SYRINGE
INTRAMUSCULAR | Status: AC
  Filled 2018-11-14: qty 5

## 2018-11-14 MED ORDER — EPINEPHRINE PF 1 MG/ML IJ SOLN
INTRAMUSCULAR | Status: AC
  Filled 2018-11-14: qty 1

## 2018-11-14 MED ORDER — PROPRANOLOL HCL ER 80 MG PO CP24: 80.0000 mg | ORAL_CAPSULE | Freq: Every day | ORAL | Status: DC

## 2018-11-14 MED ORDER — ALBUTEROL SULFATE HFA 108 (90 BASE) MCG/ACT IN AERS: 2.0000 | INHALATION_SPRAY | Freq: Four times a day (QID) | RESPIRATORY_TRACT | Status: DC | PRN

## 2018-11-14 MED ORDER — SUCCINYLCHOLINE CHLORIDE 200 MG/10ML IV SOSY
PREFILLED_SYRINGE | INTRAVENOUS | Status: AC
  Filled 2018-11-14: qty 10

## 2018-11-14 MED ORDER — HYDROMORPHONE HCL 1 MG/ML IJ SOLN
0.5000 mg | INTRAMUSCULAR | Status: DC | PRN
  Administered 2018-11-14: 0.5 mg via INTRAVENOUS
  Filled 2018-11-14: qty 0.5

## 2018-11-14 MED ORDER — KCL IN DEXTROSE-NACL 20-5-0.45 MEQ/L-%-% IV SOLN
INTRAVENOUS | Status: DC
  Administered 2018-11-14: 13:00:00 via INTRAVENOUS
  Filled 2018-11-14: qty 1000

## 2018-11-14 MED ORDER — OXYCODONE HCL 5 MG PO TABS: 5.0000 mg | ORAL_TABLET | ORAL | 0 refills | Status: DC | PRN

## 2018-11-14 MED ORDER — ALPRAZOLAM 0.5 MG PO TABS
0.5000 mg | ORAL_TABLET | Freq: Every day | ORAL | Status: DC | PRN
  Administered 2018-11-15: 0.5 mg via ORAL
  Filled 2018-11-14: qty 2

## 2018-11-14 MED ORDER — POTASSIUM 99 MG PO TABS: 99.0000 mg | ORAL_TABLET | Freq: Every day | ORAL | Status: DC

## 2018-11-14 MED ORDER — CIPROFLOXACIN HCL 500 MG PO TABS: 500.0000 mg | ORAL_TABLET | Freq: Two times a day (BID) | ORAL | 0 refills | Status: AC

## 2018-11-14 MED ORDER — OXYCODONE HCL 5 MG PO TABS
5.0000 mg | ORAL_TABLET | ORAL | Status: DC | PRN
  Administered 2018-11-14 – 2018-11-15 (×4): 5 mg via ORAL
  Filled 2018-11-14 (×4): qty 1

## 2018-11-14 MED ORDER — EPHEDRINE 5 MG/ML INJ
INTRAVENOUS | Status: AC
  Filled 2018-11-14: qty 10

## 2018-11-14 MED ORDER — ACETAMINOPHEN 500 MG PO TABS
ORAL_TABLET | ORAL | Status: AC
  Filled 2018-11-14: qty 2

## 2018-11-14 MED ORDER — ONDANSETRON HCL 4 MG/2ML IJ SOLN
INTRAMUSCULAR | Status: DC | PRN
  Administered 2018-11-14: 4 mg via INTRAVENOUS

## 2018-11-14 MED ORDER — ROCURONIUM BROMIDE 100 MG/10ML IV SOLN
INTRAVENOUS | Status: DC | PRN
  Administered 2018-11-14: 30 mg via INTRAVENOUS

## 2018-11-14 MED ORDER — GLYCOPYRROLATE PF 0.2 MG/ML IJ SOSY
PREFILLED_SYRINGE | INTRAMUSCULAR | Status: AC
  Filled 2018-11-14: qty 1

## 2018-11-14 MED ORDER — ONDANSETRON HCL 4 MG/2ML IJ SOLN: 4.0000 mg | Freq: Four times a day (QID) | INTRAMUSCULAR | Status: DC | PRN

## 2018-11-14 MED ORDER — SUGAMMADEX SODIUM 200 MG/2ML IV SOLN
INTRAVENOUS | Status: AC
  Filled 2018-11-14: qty 2

## 2018-11-14 MED ORDER — EPHEDRINE SULFATE 50 MG/ML IJ SOLN
INTRAMUSCULAR | Status: DC | PRN
  Administered 2018-11-14 (×2): 25 mg via INTRAVENOUS

## 2018-11-14 SURGICAL SUPPLY — 95 items
ADH SKN CLS APL DERMABOND .7 (GAUZE/BANDAGES/DRESSINGS) ×4
APL PRP STRL LF DISP 70% ISPRP (MISCELLANEOUS)
BAG DECANTER FOR FLEXI CONT (MISCELLANEOUS) ×3 IMPLANT
BANDAGE ACE 6X5 VEL STRL LF (GAUZE/BANDAGES/DRESSINGS) IMPLANT
BINDER BREAST LRG (GAUZE/BANDAGES/DRESSINGS) IMPLANT
BINDER BREAST MEDIUM (GAUZE/BANDAGES/DRESSINGS) IMPLANT
BINDER BREAST XLRG (GAUZE/BANDAGES/DRESSINGS) ×1 IMPLANT
BINDER BREAST XXLRG (GAUZE/BANDAGES/DRESSINGS) IMPLANT
BLADE SURG 10 STRL SS (BLADE) ×4 IMPLANT
BLADE SURG 15 STRL LF DISP TIS (BLADE) ×2 IMPLANT
BLADE SURG 15 STRL SS (BLADE) ×3
BNDG GAUZE ELAST 4 BULKY (GAUZE/BANDAGES/DRESSINGS) ×6 IMPLANT
CANISTER SUCT 1200ML W/VALVE (MISCELLANEOUS) ×3 IMPLANT
CHLORAPREP W/TINT 26 (MISCELLANEOUS) ×2 IMPLANT
COVER BACK TABLE REUSABLE LG (DRAPES) ×3 IMPLANT
COVER MAYO STAND REUSABLE (DRAPES) ×3 IMPLANT
COVER WAND RF STERILE (DRAPES) IMPLANT
DECANTER SPIKE VIAL GLASS SM (MISCELLANEOUS) ×2 IMPLANT
DERMABOND ADVANCED (GAUZE/BANDAGES/DRESSINGS) ×2
DERMABOND ADVANCED .7 DNX12 (GAUZE/BANDAGES/DRESSINGS) ×4 IMPLANT
DRAIN CHANNEL 15F RND FF W/TCR (WOUND CARE) IMPLANT
DRAIN CHANNEL 19F RND (DRAIN) IMPLANT
DRAPE TOP ARMCOVERS (MISCELLANEOUS) ×3 IMPLANT
DRAPE U-SHAPE 76X120 STRL (DRAPES) ×3 IMPLANT
DRAPE UTILITY XL STRL (DRAPES) ×3 IMPLANT
DRSG PAD ABDOMINAL 8X10 ST (GAUZE/BANDAGES/DRESSINGS) ×6 IMPLANT
DRSG TEGADERM 2-3/8X2-3/4 SM (GAUZE/BANDAGES/DRESSINGS) IMPLANT
DURAPREP 26ML APPLICATOR (WOUND CARE) ×2 IMPLANT
ELECT BLADE 4.0 EZ CLEAN MEGAD (MISCELLANEOUS) ×3
ELECT BLADE 6.5 EXT (BLADE) IMPLANT
ELECT COATED BLADE 2.86 ST (ELECTRODE) ×3 IMPLANT
ELECT REM PT RETURN 9FT ADLT (ELECTROSURGICAL) ×3
ELECTRODE BLDE 4.0 EZ CLN MEGD (MISCELLANEOUS) ×2 IMPLANT
ELECTRODE REM PT RTRN 9FT ADLT (ELECTROSURGICAL) ×2 IMPLANT
EVACUATOR SILICONE 100CC (DRAIN) IMPLANT
GAUZE SPONGE 4X4 12PLY STRL (GAUZE/BANDAGES/DRESSINGS) IMPLANT
GAUZE SPONGE 4X4 12PLY STRL LF (GAUZE/BANDAGES/DRESSINGS) IMPLANT
GLOVE BIO SURGEON STRL SZ 6 (GLOVE) ×6 IMPLANT
GLOVE BIO SURGEON STRL SZ 6.5 (GLOVE) ×1 IMPLANT
GLOVE BIOGEL PI IND STRL 6.5 (GLOVE) IMPLANT
GLOVE BIOGEL PI IND STRL 7.0 (GLOVE) IMPLANT
GLOVE BIOGEL PI INDICATOR 6.5 (GLOVE) ×1
GLOVE BIOGEL PI INDICATOR 7.0 (GLOVE) ×1
GLOVE SURG SYN 7.5  E (GLOVE) ×1
GLOVE SURG SYN 7.5 E (GLOVE) ×2 IMPLANT
GLOVE SURG SYN 7.5 PF PI (GLOVE) IMPLANT
GOWN STRL REUS W/ TWL LRG LVL3 (GOWN DISPOSABLE) ×4 IMPLANT
GOWN STRL REUS W/TWL LRG LVL3 (GOWN DISPOSABLE) ×6
IMPL BREAST P3.3-3.8XMDRT 150 (Breast) IMPLANT
IMPL BREAST SALINE HP 400CC (Breast) IMPLANT
IMPL BRST P3.3-3.8XMDRT 150CC (Breast) ×2 IMPLANT
IMPLANT BREAST SALINE 150CC (Breast) ×3 IMPLANT
IMPLANT BREAST SALINE HP 400CC (Breast) ×3 IMPLANT
IV NS 1000ML (IV SOLUTION) ×3
IV NS 1000ML BAXH (IV SOLUTION) IMPLANT
IV NS 500ML (IV SOLUTION) ×3
IV NS 500ML BAXH (IV SOLUTION) ×2 IMPLANT
KIT FILL SYSTEM UNIVERSAL (SET/KITS/TRAYS/PACK) IMPLANT
MARKER SKIN DUAL TIP RULER LAB (MISCELLANEOUS) IMPLANT
NDL FILTER BLUNT 18X1 1/2 (NEEDLE) IMPLANT
NDL HYPO 25X1 1.5 SAFETY (NEEDLE) ×2 IMPLANT
NEEDLE FILTER BLUNT 18X 1/2SAF (NEEDLE)
NEEDLE FILTER BLUNT 18X1 1/2 (NEEDLE) IMPLANT
NEEDLE HYPO 25X1 1.5 SAFETY (NEEDLE) ×3 IMPLANT
NS IRRIG 1000ML POUR BTL (IV SOLUTION) ×3 IMPLANT
PACK BASIN DAY SURGERY FS (CUSTOM PROCEDURE TRAY) ×3 IMPLANT
PENCIL BUTTON HOLSTER BLD 10FT (ELECTRODE) ×3 IMPLANT
PIN SAFETY STERILE (MISCELLANEOUS) IMPLANT
SHEET MEDIUM DRAPE 40X70 STRL (DRAPES) ×6 IMPLANT
SIZER BREAST SGL USE HP 400CC (SIZER) ×3
SIZER BREAST SGL USE HP 425CC (SIZER) ×3
SIZER BRST SGL USE HP 400CC (SIZER) IMPLANT
SIZER BRST SGL USE HP 425CC (SIZER) IMPLANT
SLEEVE SCD COMPRESS KNEE MED (MISCELLANEOUS) ×3 IMPLANT
SPONGE LAP 18X18 RF (DISPOSABLE) ×6 IMPLANT
STAPLER VISISTAT 35W (STAPLE) ×3 IMPLANT
STRIP CLOSURE SKIN 1/2X4 (GAUZE/BANDAGES/DRESSINGS) ×4 IMPLANT
SUT ETHILON 2 0 FS 18 (SUTURE) IMPLANT
SUT MNCRL AB 4-0 PS2 18 (SUTURE) ×4 IMPLANT
SUT PDS AB 2-0 CT2 27 (SUTURE) IMPLANT
SUT SILK 2 0 SH (SUTURE) IMPLANT
SUT VIC AB 3-0 PS1 18 (SUTURE) ×6
SUT VIC AB 3-0 PS1 18XBRD (SUTURE) ×2 IMPLANT
SUT VIC AB 3-0 SH 27 (SUTURE) ×9
SUT VIC AB 3-0 SH 27X BRD (SUTURE) ×2 IMPLANT
SUT VICRYL 4-0 PS2 18IN ABS (SUTURE) ×3 IMPLANT
SYR 20CC LL (SYRINGE) ×1 IMPLANT
SYR 50ML LL SCALE MARK (SYRINGE) ×3 IMPLANT
SYR BULB IRRIGATION 50ML (SYRINGE) ×6 IMPLANT
SYR CONTROL 10ML LL (SYRINGE) ×3 IMPLANT
TAPE MEASURE VINYL STERILE (MISCELLANEOUS) ×3 IMPLANT
TOWEL GREEN STERILE FF (TOWEL DISPOSABLE) ×6 IMPLANT
TUBE CONNECTING 20X1/4 (TUBING) ×6 IMPLANT
UNDERPAD 30X30 (UNDERPADS AND DIAPERS) ×6 IMPLANT
YANKAUER SUCT BULB TIP NO VENT (SUCTIONS) ×3 IMPLANT

## 2018-11-14 NOTE — Anesthesia Preprocedure Evaluation (Addendum)
Anesthesia Evaluation  Patient identified by MRN, date of birth, ID band Patient awake    Reviewed: Allergy & Precautions, NPO status , Patient's Chart, lab work & pertinent test results, reviewed documented beta blocker date and time   History of Anesthesia Complications (+) AWARENESS UNDER ANESTHESIA and history of anesthetic complications  Airway Mallampati: II  TM Distance: >3 FB Neck ROM: Full    Dental no notable dental hx. (+) Teeth Intact, Caps Implants:   Pulmonary asthma , pneumonia, resolved,    Pulmonary exam normal breath sounds clear to auscultation       Cardiovascular hypertension, Pt. on medications and Pt. on home beta blockers + dysrhythmias Atrial Fibrillation  Rhythm:Irregular Rate:Normal  Chronic A. Fib - rate controlled, on Xarelto   Neuro/Psych Anxiety negative neurological ROS     GI/Hepatic Neg liver ROS, GERD  Medicated and Controlled,  Endo/Other  Hx/o Breast Ca S/P RT Acquired absence of bilateral breast Obesity  Renal/GU negative Renal ROS  negative genitourinary   Musculoskeletal  (+) Arthritis , Osteoarthritis,    Abdominal (+) + obese,   Peds  Hematology  (+) anemia , Xarelto therapy- last dose 11/11/2018   Anesthesia Other Findings   Reproductive/Obstetrics                            Anesthesia Physical Anesthesia Plan  ASA: III  Anesthesia Plan: General   Post-op Pain Management:    Induction: Intravenous  PONV Risk Score and Plan: 4 or greater and Ondansetron, Dexamethasone and Treatment may vary due to age or medical condition  Airway Management Planned: Oral ETT  Additional Equipment:   Intra-op Plan:   Post-operative Plan: Extubation in OR  Informed Consent: I have reviewed the patients History and Physical, chart, labs and discussed the procedure including the risks, benefits and alternatives for the proposed anesthesia with the patient  or authorized representative who has indicated his/her understanding and acceptance.     Dental advisory given  Plan Discussed with: CRNA and Surgeon  Anesthesia Plan Comments:         Anesthesia Quick Evaluation

## 2018-11-14 NOTE — Anesthesia Procedure Notes (Signed)
Procedure Name: Intubation Date/Time: 11/14/2018 7:43 AM Performed by: Marrianne Mood, CRNA Pre-anesthesia Checklist: Patient identified, Emergency Drugs available, Suction available and Patient being monitored Patient Re-evaluated:Patient Re-evaluated prior to induction Oxygen Delivery Method: Circle system utilized Preoxygenation: Pre-oxygenation with 100% oxygen Induction Type: IV induction Ventilation: Mask ventilation without difficulty Tube type: Oral Tube size: 7.0 mm Number of attempts: 1 Airway Equipment and Method: Stylet and Oral airway Placement Confirmation: ETT inserted through vocal cords under direct vision,  positive ETCO2 and breath sounds checked- equal and bilateral Secured at: 21 cm Tube secured with: Tape Dental Injury: Teeth and Oropharynx as per pre-operative assessment

## 2018-11-14 NOTE — Op Note (Signed)
Operative Note   DATE OF OPERATION: 6.9.20  LOCATION: [facility]-observation  SURGICAL DIVISION: Plastic Surgery  PREOPERATIVE DIAGNOSES:  1. History breast cancer 2. Acquired asbence breast 3. History therapeutic radiation  POSTOPERATIVE DIAGNOSES:  same  PROCEDURE:  1. Removal right chest tissue expander and placement saline implant 2. Left breast augmentation 3. Left breast mastopexy  SURGEON: [name] MD MBA  ASSISTANT: none  ANESTHESIA:  General.   EBL: 50 ml  COMPLICATIONS: None immediate.   INDICATIONS FOR PROCEDURE:  The patient, [name], is a 78 y.o. female born on September 07, 1940, is here for staged breast reconstruction following delayed latissimus dorsi flap and tissue expander placement right chest.    FINDINGS: Natrelle Smooth Round implant placed. RIGHT High Profile 400 ml REF 68HP-400 ml, fill volume 410 ml SN [id number] LEFT Moderate Profile 150 ml implant REF [id number]-150, fill volume 180 ml SN [id number]. Left mastopexy 2 g  DESCRIPTION OF PROCEDURE:  The patient's operative site was marked with the patient in the preoperative area including sternal notch, chest midline, anterior axillary lines. Left inframammary fold marked over anterior surface left breast. With aide of Wise pattern marker location anticipated for nipple areolar complex and vertical limbs for mastopexy marked. The patient was taken to the operating room. SCDs were placed and IV antibiotics were given. The patient's operative site was prepped and draped in a sterile fashion. A time out was performed and all information was confirmed to be correct. Incision made in right chest transverse scar and carried to latissimus dorsi muscle. Muscle divided in direction of fibers and expander removed. Capsulotomies performed circumferentially. Radial scoring anterior capsule completed over lower pole. Sizer placed.  I then directed attention to left breast. Incision made over lower pole breast in area  anticipated skin resection. This was carried to chest wall and lateral border pectoralis elevated. Submuscular dissection completed to anticipated dimensions implant. Sizer placed. Patient brought to sitting position. The left breast was tailor tacked to previously marked incisions and adjusted. Natrelle High Profile 400 ml implant selected for right chest, Moderate Profile 150 ml for left breast. Patient returned to supine position. Superior pedicle de epithelialized over left breast. Medial and lateral pillars developed. Skin and small amount breast tissue excised over lower pole. Left breast cavity irrigated with saline solution containing Ancef, gentamicin, and bacitracin. Hemostasis ensured. Cavity then irrigated with saline Betadine solution. Implant prepared and placed in submuscular cavity. Implant filled to 180 ml, fill tubing removed. Fill tab closure and implant orientation ensured. Closure completed with 3-0 vicryl in dermis along inframammary fold and vertical limb. NAC inset with 4-0 vicryl in dermis. Skin closure completed throughout with 4-0 monocryl subcuticular stitch.   Over right chest, cavity irrigated with saline solution containing Ancef, gentamicin, and bacitracin. Hemostasis ensured. Cavity irrigated with saline Betadine solution. Implant prepared and placed in cavity. Implant filled to 410 ml, fill tubing removed. Fill tab closure and implant orientation ensured. Closure completed with 3-0 vicryl in latissimus muscle, 4-0 vicryl in dermis. Skin closure completed with 4-0 monocryl subcuticular stitch. Steri strips applied bilateral, followed by dry dressing, breast binder.   The patient was allowed to wake from anesthesia, extubated and taken to the recovery room in satisfactory condition.   SPECIMENS: left mastopexy  DRAINS: non  [name], MD [facility] Plastic & Reconstructive Surgery [phone], pin [phone]

## 2018-11-14 NOTE — Interval H&P Note (Signed)
History and Physical Interval Note:  11/14/2018 6:09 AM  [name]  has presented today for surgery, with the diagnosis of acquired absence breast, history breast cancer, history therapeutic radiation.  The various methods of treatment have been discussed with the patient and family. After consideration of risks, benefits and other options for treatment, the patient has consented to  Procedure(s): REMOVAL OF RIGHT TISSUE EXPANDER AND PLACEMENT OF SALINE IMPLANT (Right) LEFT BREAST AUGMENTATION (Left) LEFT BREAST MASTOPEXY (Left) as a surgical intervention.  The patient's history has been reviewed, patient examined, no change in status, stable for surgery.  I have reviewed the patient's chart and labs.  Questions were answered to the patient's satisfaction.     Arnoldo Hooker [name]

## 2018-11-14 NOTE — Anesthesia Postprocedure Evaluation (Signed)
Anesthesia Post Note  Patient: [name]  Procedure(s) Performed: REMOVAL OF RIGHT TISSUE EXPANDER AND PLACEMENT OF SALINE IMPLANT (Right Breast) LEFT BREAST AUGMENTATION (Left Breast) LEFT BREAST MASTOPEXY (Left Breast)     Patient location during evaluation: PACU Anesthesia Type: General Level of consciousness: awake and alert and oriented Pain management: pain level controlled Vital Signs Assessment: post-procedure vital signs reviewed and stable Respiratory status: spontaneous breathing, nonlabored ventilation and respiratory function stable Cardiovascular status: blood pressure returned to baseline and stable Postop Assessment: no apparent nausea or vomiting Anesthetic complications: no    Last Vitals:  Vitals:   11/14/18 1130 11/14/18 1145  BP: (!) 147/83 (!) 150/99  Pulse: 73 66  Resp: 14 14  Temp:    SpO2: 98% 100%    Last Pain:  Vitals:   11/14/18 1130  TempSrc:   PainSc: 4                  [name] A.

## 2018-11-14 NOTE — Transfer of Care (Signed)
Immediate Anesthesia Transfer of Care Note  Patient: [name]  Procedure(s) Performed: REMOVAL OF RIGHT TISSUE EXPANDER AND PLACEMENT OF SALINE IMPLANT (Right Breast) LEFT BREAST AUGMENTATION (Left Breast) LEFT BREAST MASTOPEXY (Left Breast)  Patient Location: PACU  Anesthesia Type:General  Level of Consciousness: awake and patient cooperative  Airway & Oxygen Therapy: Patient Spontanous Breathing and Patient connected to face mask oxygen  Post-op Assessment: Report given to RN and Post -op Vital signs reviewed and stable  Post vital signs: Reviewed and stable  Last Vitals:  Vitals Value Taken Time  BP 167/96 11/14/2018 10:47 AM  Temp    Pulse 77 11/14/2018 10:50 AM  Resp 12 11/14/2018 10:50 AM  SpO2 98 % 11/14/2018 10:50 AM  Vitals shown include unvalidated device data.  Last Pain:  Vitals:   11/14/18 0636  TempSrc: Oral  PainSc: 0-No pain      Patients Stated Pain Goal: 0 (79/48/01 6553)  Complications: No apparent anesthesia complications

## 2018-11-15 ENCOUNTER — Encounter (HOSPITAL_BASED_OUTPATIENT_CLINIC_OR_DEPARTMENT_OTHER): Payer: Self-pay | Admitting: Plastic Surgery

## 2018-11-15 DIAGNOSIS — Z421 Encounter for breast reconstruction following mastectomy: Secondary | ICD-10-CM | POA: Diagnosis not present

## 2018-11-15 NOTE — Discharge Summary (Signed)
Physician Discharge Summary  Patient ID: [name] MRN: [id number] DOB/AGE: [birth year] 76 y.o.  Admit date: 11/14/2018 Discharge date: 11/15/2018  Admission Diagnoses: History breast cancer, acquired absence breast history therapeutic radiation  Discharge Diagnoses:  same  Discharged Condition: stable  Hospital Course: Post operatively patient did well with pain controlled on oxycodone. Instructed on incision care. Tolerated diet. Plan resume Xarelto in 3 days.  Treatments: surgery: removal right chest tissue expander and placement saline implant, left breast mastopexy augmentation 6.9.20  Discharge Exam: Blood pressure 124/62, pulse 72, temperature 98.3 F (36.8 C), resp. rate 16, height 5' (1.524 m), weight 72.5 kg, SpO2 99 %. Incision/Wound: steris in place, dried drainage on dressings and steris, dressings changed, bilateral soft, left NAC viable  Disposition: Discharge disposition: 01-Home or Self Care       Discharge Instructions    Call MD for:  redness, tenderness, or signs of infection (pain, swelling, bleeding, redness, odor or green/yellow discharge around incision site)   Complete by:  As directed    Call MD for:  temperature >100.5   Complete by:  As directed    Discharge instructions   Complete by:  As directed    Ok to remove dressings and shower am 6.11.20. Soap and water ok, pat incisions dry. No creams or ointments over incisions. If steri strips come off, may reapply paper tape over incisions.  Breast binder or soft compression bra all other times.  Ok to raise arms above shoulders for bathing and dressing.  No house yard work or exercise until cleared by MD. No sex until cleared by MD. No riding or working with horses until cleared by MD  MAY RESUME XARELTO 6.12.20   Driving Restrictions   Complete by:  As directed    No driving if taking narcotics   Lifting restrictions   Complete by:  As directed    No lifting > 5 lbs until cleared by MD   Resume previous diet   Complete by:  As directed       Follow-up Information    [name], MD In 1 week.   Specialty:  Plastic Surgery Why:  as scheduled Contact information: [place] [address] [place] [address] [phone]           Signed: [name] 11/15/2018, 7:35 AM

## 2018-11-15 NOTE — Discharge Instructions (Signed)

## 2018-11-27 ENCOUNTER — Other Ambulatory Visit: Payer: Self-pay | Admitting: Cardiology

## 2018-12-11 ENCOUNTER — Other Ambulatory Visit: Payer: Self-pay

## 2018-12-11 ENCOUNTER — Ambulatory Visit
Admission: RE | Admit: 2018-12-11 | Discharge: 2018-12-11 | Disposition: A | Discharge status: Home / Self Care | Payer: Medicare Other | Source: Ambulatory Visit | Attending: Oncology | Admitting: Oncology

## 2018-12-11 DIAGNOSIS — C50211 Malignant neoplasm of upper-inner quadrant of right female breast: Secondary | ICD-10-CM

## 2018-12-13 ENCOUNTER — Other Ambulatory Visit: Payer: Self-pay | Admitting: Oncology

## 2018-12-13 DIAGNOSIS — Z1231 Encounter for screening mammogram for malignant neoplasm of breast: Secondary | ICD-10-CM

## 2018-12-19 NOTE — Progress Notes (Signed)
Virtual Visit via Video Note changed to phone visit due to technical difficulties   This visit type was conducted due to national recommendations for restrictions regarding the COVID-19 Pandemic (e.g. social distancing) in an effort to limit this patient's exposure and mitigate transmission in our community.  Due to her co-morbid illnesses, this patient is at least at moderate risk for complications without adequate follow up.  This format is felt to be most appropriate for this patient at this time.  All issues noted in this document were discussed and addressed.  A limited physical exam was performed with this format.  Please refer to the patient's chart for her consent to telehealth for [facility].   Date:  01/01/2019   ID:  [name], DOB Jan 12, 1941, MRN [id number]  Patient Location:Home Provider Location: Home  PCP:  [name], MD  Cardiologist:  Dr [name]  Evaluation Performed:  Follow-Up Visit  Chief Complaint:  FU atrial fibrillation  History of Present Illness:    78 year old female previously followed by Dr. [name] for follow-up of atrial fibrillation.  Echocardiogram July 2018 showed ejection fraction 50%.  She has been managed with rate control and anticoagulation. Since last seen, she has some dyspnea she attributes to asthma.  No chest pain, palpitations, syncope or bleeding.  The patient does not have symptoms concerning for COVID-19 infection (fever, chills, cough, or new shortness of breath).    Past Medical History:  Diagnosis Date  . Anemia    history of  . Anxiety   . Arthritis   . Asthma   . Breast cancer ([place])   . Cancer ([place])    skin cancers, one melanoma   . Complication of anesthesia    patient woke up during colonoscopy  . Concussion    8 yrs. ago due to being thrown from a horse  . Dislocated elbow 10/12/2016   left  . Dysrhythmia 12/2016   A-fib  . Family history of breast cancer   . GERD (gastroesophageal reflux disease)    . History of kidney stones   . History of radiation therapy 03/23/17-05/04/17   right chest wall 50.4 Gy in 28 fractions, right axilla 45 Gy in 25 fractions  . Hypertension   . 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 Keratosis, actinic   . Melanoma ([place])   . Persistent atrial fibrillation 12/16/2016   CHA2DS2VASC score 3  . Pneumonia    hx of    Past Surgical History:  Procedure Laterality Date  . brachial skin removal due to deforimity Bilateral   . BREAST ENHANCEMENT SURGERY Left 11/14/2018   Procedure: LEFT BREAST AUGMENTATION;  Surgeon: [name], MD;  Location: [place];  Service: Plastics;  Laterality: Left;  . BREAST RECONSTRUCTION Right 06/26/2018  . BREAST RECONSTRUCTION WITH PLACEMENT OF TISSUE EXPANDER AND ALLODERM Right 06/26/2018   Procedure: RIGHT BREAST RECONSTRUCTION WITH PLACEMENT OF TISSUE EXPANDER;  Surgeon: [name], MD;  Location: [place];  Service: Plastics;  Laterality: Right;  . BREAST RECONSTRUCTION WITH PLACEMENT OF TISSUE EXPANDER AND FLEX HD (ACELLULAR HYDRATED DERMIS) Right 01/28/2017    RIGHT BREAST RECONSTRUCTION WITH PLACEMENT OF TISSUE EXPANDER AND ALLODERM;  Surgeon: [name], MD;  Location: [facility];  Service: Plastics;  Laterality: Right;  . BUNIONECTOMY     left   . CATARACT EXTRACTION     bilaterla cataract surgery   . COLONOSCOPY W/ POLYPECTOMY    . KNEE ARTHROSCOPY     left   .  LATISSIMUS FLAP TO BREAST Right 06/26/2018   Procedure: LATISSIMUS FLAP TO RIGHT CHEST;  Surgeon: [name], MD;  Location: [place];  Service: Plastics;  Laterality: Right;  . MASTECTOMY W/ SENTINEL NODE BIOPSY Right 01/28/2017   Procedure: RIGHT TOTAL MASTECTOMY WITH SENTINEL LYMPH NODE BIOPSY;  Surgeon: Excell Seltzer, MD;  Location: [place];  Service: General;  Laterality: Right;  . MASTOPEXY Left 11/14/2018   Procedure: LEFT BREAST MASTOPEXY;  Surgeon: [name], MD;  Location: [facility];  Service:  Plastics;  Laterality: Left;  Marland Kitchen MELANOMA EXCISION Left   . polyp removed from uterus     . REMOVAL OF TISSUE EXPANDER AND PLACEMENT OF IMPLANT Right 11/14/2018   Procedure: REMOVAL OF RIGHT TISSUE EXPANDER AND PLACEMENT OF SALINE IMPLANT;  Surgeon: [name], MD;  Location: [place];  Service: Plastics;  Laterality: Right;  . right wrist pinning     . skin cancers removed    . TISSUE EXPANDER PLACEMENT Right 02/27/2018   Procedure: REMOVAL OF TISSUE EXPANDER;  Surgeon: [name], MD;  Location: [place] [facility];  Service: Plastics;  Laterality: Right;  . TOTAL KNEE ARTHROPLASTY Right 01/29/2013   Procedure: RIGHT TOTAL KNEE ARTHROPLASTY;  Surgeon: Gearlean Alf, MD;  Location: [facility];  Service: Orthopedics;  Laterality: Right;  . TOTAL KNEE ARTHROPLASTY Left 08/29/2017   Procedure: LEFT TOTAL KNEE ARTHROPLASTY;  Surgeon: Gaynelle Arabian, MD;  Location: [facility];  Service: Orthopedics;  Laterality: Left;     Current Meds  Medication Sig  . albuterol (PROVENTIL HFA;VENTOLIN HFA) 108 (90 Base) MCG/ACT inhaler Inhale 2 puffs into the lungs every 6 (six) hours as needed for wheezing or shortness of breath. For wheezing/shortness of breath  . ALPRAZolam (XANAX) 0.5 MG tablet Take 0.5 mg by mouth daily as needed for anxiety.   Marland Kitchen BREO ELLIPTA 100-25 MCG/INH AEPB Inhale 1 puff into the lungs daily.  Marland Kitchen desonide (DESOWEN) 0.05 % cream Apply 1 application topically 2 (two) times daily as needed (for skin irritation.).  Marland Kitchen diclofenac sodium (VOLTAREN) 1 % GEL Apply 2 g topically daily as needed (pain).   Marland Kitchen EPINEPHrine (EPIPEN 2-PAK) 0.3 mg/0.3 mL IJ SOAJ injection Inject 0.3 mg into the muscle once.  Marland Kitchen letrozole (FEMARA) 2.5 MG tablet Take 1 tablet (2.5 mg total) by mouth daily.  Marland Kitchen levocetirizine (XYZAL) 5 MG tablet Take 5 mg by mouth at bedtime.   . methocarbamol (ROBAXIN) 500 MG tablet Take 1 tablet (500 mg total) by mouth every 8 (eight) hours as needed for muscle spasms.  Vladimir Faster  Glycol-Propyl Glycol (SYSTANE OP) Place 1 drop into both eyes daily.  . Potassium 99 MG TABS Take 99 mg by mouth daily.   . propranolol ER (INDERAL LA) 80 MG 24 hr capsule Take 80 mg by mouth daily.  . ranitidine (ZANTAC) 150 MG capsule Take 1 capsule (150 mg total) by mouth daily. (Patient taking differently: Take 150 mg by mouth daily as needed for heartburn. )  . rivaroxaban (XARELTO) 20 MG TABS tablet Take 1 tablet (20 mg total) by mouth daily with supper.  . triamterene-hydrochlorothiazide (MAXZIDE-25) 37.5-25 MG per tablet Take 1 tablet by mouth every morning.   . venlafaxine XR (EFFEXOR-XR) 75 MG 24 hr capsule TAKE 1 CAPSULE (75 MG TOTAL) BY MOUTH DAILY WITH BREAKFAST.     Allergies:   Chlorhexidine gluconate, Doxycycline, Nsaids, Tylenol [acetaminophen], Adhesive [tape], and Codeine   Social History   Tobacco Use  . Smoking status: Never Smoker  .  Smokeless tobacco: Never Used  Substance Use Topics  . Alcohol use: Yes    Comment: occ  . Drug use: No     Family Hx: The patient's family history includes Breast cancer in an other family member; COPD in her father.  ROS:   Please see the history of present illness.    No Fever, chills  or productive cough All other systems reviewed and are negative.  Recent Labs: 01/17/2018: ALT 10 11/10/2018: BUN 12; Creatinine, Ser 0.74; Hemoglobin 13.7; Platelets 235; Potassium 4.2; Sodium 131   Wt Readings from Last 3 Encounters:  01/01/19 158 lb (71.7 kg)  11/14/18 159 lb 12.8 oz (72.5 kg)  06/26/18 150 lb (68 kg)     Objective:    Vital Signs:  BP (!) 154/99   Pulse 89   Ht 5' (1.524 m)   Wt 158 lb (71.7 kg)   BMI 30.86 kg/m    VITAL SIGNS:  reviewed NAD Answers questions appropriately Normal affect Remainder of physical examination not performed (telehealth visit; coronavirus pandemic)  ASSESSMENT & PLAN:    1. Atrial fibrillation-plan is rate control and anticoagulation.  Continue propranolol and Xarelto.  Patient is  asymptomatic. 2. Hypertension-patient's blood pressure is elevated at home.  Add amlodipine 5 mg daily and follow.  COVID-19 Education: The importance of social distancing was discussed today.  Time:   Today, I have spent 20 minutes with the patient with telehealth technology discussing the above problems.     Medication Adjustments/Labs and Tests Ordered: Current medicines are reviewed at length with the patient today.  Concerns regarding medicines are outlined above.   Tests Ordered: No orders of the defined types were placed in this encounter.   Medication Changes: No orders of the defined types were placed in this encounter.   Follow Up:  In Person in 6 month(s)  Signed, Kirk Ruths, MD  01/01/2019 9:18 AM    [place]

## 2018-12-28 ENCOUNTER — Other Ambulatory Visit: Payer: Self-pay | Admitting: Cardiology

## 2018-12-29 ENCOUNTER — Telehealth: Payer: Self-pay | Admitting: Cardiology

## 2018-12-29 NOTE — Telephone Encounter (Signed)
Spoke with pt, she will track her bp over the weekend and have those numbers available for appointment.

## 2018-12-29 NOTE — Telephone Encounter (Signed)
New Message:   Pt has an appt on Monday with Dr [name]. She wanted me to send you a note stating that her blood pressure have been going up . She did not remember the readings.

## 2018-12-29 NOTE — Telephone Encounter (Signed)
LVM for pt to call back and be pre-screened for 01-01-19 appt with Dr [name].

## 2019-01-01 ENCOUNTER — Telehealth (INDEPENDENT_AMBULATORY_CARE_PROVIDER_SITE_OTHER): Payer: Medicare Other | Admitting: Cardiology

## 2019-01-01 ENCOUNTER — Other Ambulatory Visit: Payer: Self-pay

## 2019-01-01 VITALS — BP 154/99 | HR 89 | Ht 60.0 in | Wt 158.0 lb

## 2019-01-01 DIAGNOSIS — I119 Hypertensive heart disease without heart failure: Secondary | ICD-10-CM

## 2019-01-01 DIAGNOSIS — I4819 Other persistent atrial fibrillation: Secondary | ICD-10-CM | POA: Diagnosis not present

## 2019-01-01 MED ORDER — AMLODIPINE BESYLATE 5 MG PO TABS: 5.0000 mg | ORAL_TABLET | Freq: Every day | ORAL | 3 refills | Status: DC

## 2019-01-01 NOTE — Patient Instructions (Signed)
Medication Instructions:  START AMLODIPINE 5 MG ONCE DAILY If you need a refill on your cardiac medications before your next appointment, please call your pharmacy.   Lab work: If you have labs (blood work) drawn today and your tests are completely normal, you will receive your results only by: Marland Kitchen MyChart Message (if you have MyChart) OR . A paper copy in the mail If you have any lab test that is abnormal or we need to change your treatment, we will call you to review the results.  Follow-Up: At [facility], you and your health needs are our priority.  As part of our continuing mission to provide you with exceptional heart care, we have created designated Provider Care Teams.  These Care Teams include your primary Cardiologist (physician) and Advanced Practice Providers (APPs -  Physician Assistants and Nurse Practitioners) who all work together to provide you with the care you need, when you need it. You will need a follow up appointment in 6 months.  Please call our office 2 months in advance to schedule this appointment.  You may see Kirk Ruths MD or one of the following Advanced Practice Providers on your designated Care Team:   Kerin Ransom, PA-C Roby Lofts, [place] . Sande Rives, PA-C

## 2019-01-01 NOTE — Telephone Encounter (Signed)
Rx refill sent to pharmacy. 

## 2019-01-20 ENCOUNTER — Other Ambulatory Visit: Payer: Self-pay | Admitting: Oncology

## 2019-01-20 DIAGNOSIS — Z170 Estrogen receptor positive status [ER+]: Secondary | ICD-10-CM

## 2019-01-20 DIAGNOSIS — C50211 Malignant neoplasm of upper-inner quadrant of right female breast: Secondary | ICD-10-CM

## 2019-01-22 ENCOUNTER — Telehealth: Payer: Self-pay | Admitting: Oncology

## 2019-01-22 NOTE — Progress Notes (Signed)
[place]  [phone] [phone]     ID: [name] DOB: 1941/02/01  MR#: [id number]  QMG#:[id number]  Patient Care Team: [name], MD as PCP - General (Family Medicine) Excell Seltzer, MD as Consulting Physician (General Surgery) Magrinat, Virgie Dad, MD as Consulting Physician (Oncology) Gery Pray, MD as Consulting Physician (Radiation Oncology) Lorelle Gibbs, MD (Radiology) Gaynelle Arabian, MD as Consulting Physician (Orthopedic Surgery) Harold Hedge, Darrick Grinder, MD as Consulting Physician (Allergy and Immunology) Teena Irani, MD (Inactive) as Consulting Physician (Gastroenterology) [name], MD as Consulting Physician (Plastic Surgery) OTHER MD:  CHIEF COMPLAINT: Estrogen receptor positive breast cancer  CURRENT TREATMENT:  Letrozole   BREAST CANCER HISTORY: From the original intake note:  The patient has had concerns in the right breast dating back to about 3 years when her primary care physician Dr. [name] noted a change. The breast has been followed very closely but no diagnosis was established. She was followed with annual diagnostic mammography and on 11/11/2016 bilateral diagnostic mammography with tomography at [facility] found the breast density to be category C. There was a new 3.5 cm irregular spiculated mass in the right breast upper inner quadrant. Ultrasound was obtained the same day and confirmed a 3.5 cm solid mass in the right breast upper inner quadrant, with no abnormalities noted in the right axilla.  Biopsy of the right breast mass in question 11/15/2013 showed ([id number] invasive lobular carcinoma, E-cadherin negative, grade 1 or 2, estrogen receptor 90% positive, progesterone receptor 2% positive, both with strong staining intensity, with an MIB-1 of 5, and no HER-2 amplification, the signals ratio being 1.14 and the number per cell 1.20.  Her subsequent history is as detailed below   INTERVAL HISTORY: [name] is seen today for follow-up and treatment of her estrogen receptor positive breast cancer.   She continues on Letrozole.  She tolerates this well except for night sweats.  Vaginal dryness and arthralgias/myalgias are not an issue.  On 11/14/2018 she had her right tissue expander exchanged for saline implant and also underwent left mastopexy, with benign pathology ([id number]).  [name]'s last bone density screening on 12/11/2018, showed a T-score of -0.5, which is considered normal.    Since her last visit here, she has not undergone any additional studies. She is scheduled for a mammogram on 01/29/2019 at [facility].    REVIEW OF SYSTEMS: [name] did well with her recent surgery and although she tells me her breasts are not symmetrical she is fairly satisfied with the way they look.  She will see Dr. Aldona Bar tomorrow and try to figure out does she want any further plastic corrections or not.  She is keeping herself mostly at home and is being very careful regarding the pandemic.  She has a rash which somehow flares up every time she uses emollients.  She also injured her left shin recently but was pleased to see that even though she continues on rivaroxaban for her atrial fibrillation she did stop bleeding after a while.  Aside from these issues a detailed review of systems today was stable   PAST MEDICAL HISTORY: Past Medical History:  Diagnosis Date  . Anemia    history of  . Anxiety   . Arthritis   . Asthma   . Breast cancer ([place])   . Cancer ([place])    skin cancers, one melanoma   . Complication of anesthesia    patient woke up during colonoscopy  . Concussion  8 yrs. ago due to being thrown from a horse  . Dislocated elbow 10/12/2016   left  . Dysrhythmia 12/2016   A-fib  . Family history of breast cancer   . GERD (gastroesophageal reflux disease)   . History of kidney stones   . History of radiation therapy 03/23/17-05/04/17   right chest wall  50.4 Gy in 28 fractions, right axilla 45 Gy in 25 fractions  . Hypertension   . 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 Keratosis, actinic   . Melanoma ([place])   . Persistent atrial fibrillation 12/16/2016   CHA2DS2VASC score 3  . Pneumonia    hx of     PAST SURGICAL HISTORY: Past Surgical History:  Procedure Laterality Date  . brachial skin removal due to deforimity Bilateral   . BREAST ENHANCEMENT SURGERY Left 11/14/2018   Procedure: LEFT BREAST AUGMENTATION;  Surgeon: [name], MD;  Location: [place];  Service: Plastics;  Laterality: Left;  . BREAST RECONSTRUCTION Right 06/26/2018  . BREAST RECONSTRUCTION WITH PLACEMENT OF TISSUE EXPANDER AND ALLODERM Right 06/26/2018   Procedure: RIGHT BREAST RECONSTRUCTION WITH PLACEMENT OF TISSUE EXPANDER;  Surgeon: [name], MD;  Location: [place];  Service: Plastics;  Laterality: Right;  . BREAST RECONSTRUCTION WITH PLACEMENT OF TISSUE EXPANDER AND FLEX HD (ACELLULAR HYDRATED DERMIS) Right 01/28/2017    RIGHT BREAST RECONSTRUCTION WITH PLACEMENT OF TISSUE EXPANDER AND ALLODERM;  Surgeon: [name], MD;  Location: [place];  Service: Plastics;  Laterality: Right;  . BUNIONECTOMY     left   . CATARACT EXTRACTION     bilaterla cataract surgery   . COLONOSCOPY W/ POLYPECTOMY    . KNEE ARTHROSCOPY     left   . LATISSIMUS FLAP TO BREAST Right 06/26/2018   Procedure: LATISSIMUS FLAP TO RIGHT CHEST;  Surgeon: [name], MD;  Location: [place];  Service: Plastics;  Laterality: Right;  . MASTECTOMY W/ SENTINEL NODE BIOPSY Right 01/28/2017   Procedure: RIGHT TOTAL MASTECTOMY WITH SENTINEL LYMPH NODE BIOPSY;  Surgeon: Excell Seltzer, MD;  Location: [place];  Service: General;  Laterality: Right;  . MASTOPEXY Left 11/14/2018   Procedure: LEFT BREAST MASTOPEXY;  Surgeon: [name], MD;  Location: [place];  Service: Plastics;  Laterality: Left;  Marland Kitchen MELANOMA EXCISION Left   . polyp removed  from uterus     . REMOVAL OF TISSUE EXPANDER AND PLACEMENT OF IMPLANT Right 11/14/2018   Procedure: REMOVAL OF RIGHT TISSUE EXPANDER AND PLACEMENT OF SALINE IMPLANT;  Surgeon: [name], MD;  Location: [place];  Service: Plastics;  Laterality: Right;  . right wrist pinning     . skin cancers removed    . TISSUE EXPANDER PLACEMENT Right 02/27/2018   Procedure: REMOVAL OF TISSUE EXPANDER;  Surgeon: [name], MD;  Location: [place];  Service: Plastics;  Laterality: Right;  . TOTAL KNEE ARTHROPLASTY Right 01/29/2013   Procedure: RIGHT TOTAL KNEE ARTHROPLASTY;  Surgeon: Gearlean Alf, MD;  Location: [facility];  Service: Orthopedics;  Laterality: Right;  . TOTAL KNEE ARTHROPLASTY Left 08/29/2017   Procedure: LEFT TOTAL KNEE ARTHROPLASTY;  Surgeon: Gaynelle Arabian, MD;  Location: [facility];  Service: Orthopedics;  Laterality: Left;    FAMILY HISTORY Family History  Problem Relation Age of Onset  . COPD Father   . Breast cancer Other        dx in her 87s-30s  The patient's father died at the age of 16 from complications of emphysema and alcohol  abuse. The patient's mother died from heart disease at age 23. The patient was an only child. The only relative with breast cancer was a maternal great aunt was diagnosed in her 49s. There is no history of ovarian cancer in the family.  GYNECOLOGIC HISTORY:  No LMP recorded. Patient is postmenopausal. Menarche age 65, first live birth age 31, she is Hyder P1. She stopped having periods in 1994. She was using intravaginal Premarin until her breast cancer diagnosis June 2018. She used oral contraceptives for approximately one year remotely with no complications.  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 Her son from her first marriage, Sabino Snipes, lives in [place] and is an Sales promotion account executive. He has 2 children. The patient's second husband, [name], is a retired Film/video editor. He has a son in [place], who  works as a Marine scientist. [name] has 5 grandchildren of his own. The patient is a Psychologist, forensic.    ADVANCED DIRECTIVES: In place. The patient tells me she has named her husband [name] and her son Sherren Mocha as healthcare part of attorney   HEALTH MAINTENANCE: Social History   Tobacco Use  . Smoking status: Never Smoker  . Smokeless tobacco: Never Used  Substance Use Topics  . Alcohol use: Yes    Comment: occ  . Drug use: No     Colonoscopy:2015  PAP:  Bone density: 01/27/2015 at [place] T score -0.3   Allergies  Allergen Reactions  . Chlorhexidine Gluconate Hives, Swelling and Rash       "ChloraPrep "  . Doxycycline Nausea And Vomiting  . Nsaids     avoid due to gerd  . Tylenol [Acetaminophen]     Avoid due to GERD  . Adhesive [Tape] Rash    Dermabond  . Codeine Nausea Only    Current Outpatient Medications  Medication Sig Dispense Refill  . albuterol (PROVENTIL HFA;VENTOLIN HFA) 108 (90 Base) MCG/ACT inhaler Inhale 2 puffs into the lungs every 6 (six) hours as needed for wheezing or shortness of breath. For wheezing/shortness of breath    . ALPRAZolam (XANAX) 0.5 MG tablet Take 0.5 mg by mouth daily as needed for anxiety.     Marland Kitchen amLODipine (NORVASC) 5 MG tablet Take 1 tablet (5 mg total) by mouth daily. 90 tablet 3  . BREO ELLIPTA 100-25 MCG/INH AEPB Inhale 1 puff into the lungs daily.    Marland Kitchen desonide (DESOWEN) 0.05 % cream Apply 1 application topically 2 (two) times daily as needed (for skin irritation.).    Marland Kitchen diclofenac sodium (VOLTAREN) 1 % GEL Apply 2 g topically daily as needed (pain).     Marland Kitchen EPINEPHrine (EPIPEN 2-PAK) 0.3 mg/0.3 mL IJ SOAJ injection Inject 0.3 mg into the muscle once.    Marland Kitchen letrozole (FEMARA) 2.5 MG tablet TAKE 1 TABLET BY MOUTH EVERY DAY 90 tablet 4  . levocetirizine (XYZAL) 5 MG tablet Take 5 mg by mouth at bedtime.     . methocarbamol (ROBAXIN) 500 MG tablet Take 1 tablet (500 mg total) by mouth every 8 (eight) hours as needed for muscle spasms. 30 tablet 0  .  Polyethyl Glycol-Propyl Glycol (SYSTANE OP) Place 1 drop into both eyes daily.    . Potassium 99 MG TABS Take 99 mg by mouth daily.     . propranolol ER (INDERAL LA) 80 MG 24 hr capsule Take 80 mg by mouth daily.    . ranitidine (ZANTAC) 150 MG capsule Take 1 capsule (150 mg total) by mouth daily. (Patient  taking differently: Take 150 mg by mouth daily as needed for heartburn. ) 30 capsule 0  . triamterene-hydrochlorothiazide (MAXZIDE-25) 37.5-25 MG per tablet Take 1 tablet by mouth every morning.     . venlafaxine XR (EFFEXOR-XR) 75 MG 24 hr capsule TAKE 1 CAPSULE (75 MG TOTAL) BY MOUTH DAILY WITH BREAKFAST. 90 capsule 2  . XARELTO 20 MG TABS tablet TAKE 1 TABLET (20 MG TOTAL) BY MOUTH DAILY WITH SUPPER. 35 tablet 5   No current facility-administered medications for this visit.     OBJECTIVE: Middle-aged white woman who appears stated age 78:   01/23/19 1313  BP: 128/82  Pulse: 69  Resp: 18  Temp: 98.9 F (37.2 C)  SpO2: 99%     Body mass index is 31.21 kg/m.   Filed Weights   01/23/19 1313  Weight: 159 lb 12.8 oz (72.5 kg)    ECOG FS:1 - Symptomatic but completely ambulatory  Sclerae unicteric, EOMs intact Oropharynx clear and moist No cervical or supraclavicular adenopathy Lungs no rales or rhonchi Heart regular rate and rhythm Abd soft, nontender, positive bowel sounds MSK no focal spinal tenderness, no upper extremity lymphedema Neuro: nonfocal, well oriented, appropriate affect Breasts: The right breast is status post mastectomy with saline implant placement.  There is no nipple reconstruction.  It is smaller than the left, which is status post mastopexy.  There is no evidence of chest wall recurrence.  Both axillae are benign.  LAB RESULTS:  CMP     Component Value Date/Time   NA 131 (L) 11/10/2018 1509   NA 134 (L) 04/25/2017 1216   K 4.2 11/10/2018 1509   K 4.3 04/25/2017 1216   CL 94 (L) 11/10/2018 1509   CO2 27 11/10/2018 1509   CO2 29 04/25/2017 1216    GLUCOSE 97 11/10/2018 1509   GLUCOSE 85 04/25/2017 1216   BUN 12 11/10/2018 1509   BUN 14.4 04/25/2017 1216   CREATININE 0.74 11/10/2018 1509   CREATININE 0.72 01/17/2018 1347   CREATININE 0.7 04/25/2017 1216   CALCIUM 9.6 11/10/2018 1509   CALCIUM 10.0 04/25/2017 1216   PROT 7.5 01/17/2018 1347   PROT 7.2 04/25/2017 1216   ALBUMIN 3.5 01/17/2018 1347   ALBUMIN 3.7 04/25/2017 1216   AST 20 01/17/2018 1347   AST 21 04/25/2017 1216   ALT 10 01/17/2018 1347   ALT 10 04/25/2017 1216   ALKPHOS 109 01/17/2018 1347   ALKPHOS 75 04/25/2017 1216   BILITOT 0.5 01/17/2018 1347   BILITOT 0.65 04/25/2017 1216   GFRNONAA >60 11/10/2018 1509   GFRNONAA >60 01/17/2018 1347   GFRAA >60 11/10/2018 1509   GFRAA >60 01/17/2018 1347    No results found for: TOTALPROTELP, ALBUMINELP, A1GS, A2GS, BETS, BETA2SER, GAMS, MSPIKE, SPEI  No results found for: KPAFRELGTCHN, LAMBDASER, [facility]  Lab Results  Component Value Date   WBC 8.1 11/10/2018   NEUTROABS 4.9 11/10/2018   HGB 13.7 11/10/2018   HCT 41.1 11/10/2018   MCV 97.2 11/10/2018   PLT 235 11/10/2018      Chemistry      Component Value Date/Time   NA 131 (L) 11/10/2018 1509   NA 134 (L) 04/25/2017 1216   K 4.2 11/10/2018 1509   K 4.3 04/25/2017 1216   CL 94 (L) 11/10/2018 1509   CO2 27 11/10/2018 1509   CO2 29 04/25/2017 1216   BUN 12 11/10/2018 1509   BUN 14.4 04/25/2017 1216   CREATININE 0.74 11/10/2018 1509   CREATININE 0.72 01/17/2018 1347  CREATININE 0.7 04/25/2017 1216      Component Value Date/Time   CALCIUM 9.6 11/10/2018 1509   CALCIUM 10.0 04/25/2017 1216   ALKPHOS 109 01/17/2018 1347   ALKPHOS 75 04/25/2017 1216   AST 20 01/17/2018 1347   AST 21 04/25/2017 1216   ALT 10 01/17/2018 1347   ALT 10 04/25/2017 1216   BILITOT 0.5 01/17/2018 1347   BILITOT 0.65 04/25/2017 1216       No results found for: LABCA2  No components found for: [id number]  No results for input(s): INR in the last 168 hours.   Urinalysis    Component Value Date/Time   COLORURINE YELLOW 01/22/2013 1345   APPEARANCEUR CLEAR 01/22/2013 1345   LABSPEC 1.011 01/22/2013 1345   PHURINE 6.5 01/22/2013 1345   GLUCOSEU NEGATIVE 01/22/2013 1345   HGBUR TRACE (A) 01/22/2013 1345   BILIRUBINUR NEGATIVE 01/22/2013 1345   KETONESUR NEGATIVE 01/22/2013 1345   PROTEINUR NEGATIVE 01/22/2013 1345   UROBILINOGEN 0.2 01/22/2013 1345   NITRITE NEGATIVE 01/22/2013 1345   LEUKOCYTESUR NEGATIVE 01/22/2013 1345     STUDIES: No results found.   ELIGIBLE FOR AVAILABLE RESEARCH PROTOCOL: no  ASSESSMENT: 78 y.o. [place] woman status post right breast upper outer quadrant biopsy 11/15/2016 for a clinical T2 N0, stage IB invasive lobular carcinoma, grade 1 or 2, estrogen and progesterone receptor positive, HER-2 nonamplified, with an MIB-1 of 5%.  (1) genetics testing 12/02/2016 through the hereditary Gene Panel offered by [facility] found no deleterious mutations in APC, ATM, AXIN2, BARD1, BMPR1A, BRCA1, BRCA2, BRIP1, CDH1, CDKN2A (p14ARF), CDKN2A (p16INK4a), CHEK2, CTNNA1, DICER1, EPCAM (Deletion/duplication testing only), GREM1 (promoter region deletion/duplication testing only), KIT, MEN1, MLH1, MSH2, MSH3, MSH6, MUTYH, NBN, NF1, NHTL1, PALB2, PDGFRA, PMS2, POLD1, POLE, PTEN, RAD50, RAD51C, RAD51D, SDHB, SDHC, SDHD, SMAD4, SMARCA4. STK11, TP53, TSC1, TSC2, and VHL.  The following genes were evaluated for sequence changes only: SDHA and HOXB13 c.251G>A variant only.    (2) anastrozole started 11/24/2016, changed to letrozole September 2018  (3) Status post right mastectomy with sentinel lymph node sampling 01/28/2017 for apT3 pN0, invasive lobular carcinoma, grade 1, with negative margins.   (a) status post right saline implant placement and left mastopexy 11/14/2018   (4) Oncotype DX score of 21 predicts a 10 year risk of recurrence outside the breast of 14% if the patient's only systemic therapy is tamoxifen for 5 years. It also  predicts no significant benefit from chemotherapy.  (5) adjuvant radiation completed 05/04/2017 Site/dose: 1. CW_Rt/50.4 Gy in 28 fractions  2. Axilla Rt/45 Gy in 25 fractions  (6) continue letrozole, 7 year treatment planned (to June 2025)  (a) bone density August 2016 was normal  (b) bone density at [facility] 11/22/2016 showed a T score of 0.0 (normal)  (c) bone density at [facility] July 2020 showed a T score of -0.5 (normal).    PLAN: [name] is now 2 years out from definitive surgery for her breast cancer with no evidence of disease recurrence.  This is very favorable.  She is tolerating letrozole well except for the nighttime hot flashes.  I think she would benefit from gabapentin at bedtime.  We discussed the possible toxicities side effects and complications of this agent and I gave her the prescription to start.  She will let me know if she has any problems from this.  Otherwise the plan is to continue letrozole to a total of 7 years given that she has a lobular breast cancer  She knows to call for any other  issue that may develop before her next visit here.     Magrinat, Virgie Dad, MD  01/23/19 1:48 PM Medical Oncology and Hematology [facility] [address] [place], [place] [address] Tel. [phone]    Fax. [phone]  I, Jacqualyn Posey am acting as a Education administrator for Chauncey Cruel, MD.   I, Lurline Del MD, have reviewed the above documentation for accuracy and completeness, and I agree with the above.

## 2019-01-22 NOTE — Telephone Encounter (Signed)
Called patient regarding upcoming Webex appointment, setting up Webex was not successful and patient preferred this to be a walk-in visit.

## 2019-01-23 ENCOUNTER — Inpatient Hospital Stay: Payer: Medicare Other

## 2019-01-23 ENCOUNTER — Inpatient Hospital Stay: Payer: Medicare Other | Attending: Oncology | Admitting: Oncology

## 2019-01-23 ENCOUNTER — Other Ambulatory Visit: Payer: Self-pay

## 2019-01-23 VITALS — BP 128/82 | HR 69 | Temp 98.9°F | Resp 18 | Ht 60.0 in | Wt 159.8 lb

## 2019-01-23 DIAGNOSIS — R61 Generalized hyperhidrosis: Secondary | ICD-10-CM | POA: Diagnosis not present

## 2019-01-23 DIAGNOSIS — Z7901 Long term (current) use of anticoagulants: Secondary | ICD-10-CM | POA: Diagnosis not present

## 2019-01-23 DIAGNOSIS — Z803 Family history of malignant neoplasm of breast: Secondary | ICD-10-CM | POA: Insufficient documentation

## 2019-01-23 DIAGNOSIS — Z923 Personal history of irradiation: Secondary | ICD-10-CM | POA: Insufficient documentation

## 2019-01-23 DIAGNOSIS — Z888 Allergy status to other drugs, medicaments and biological substances status: Secondary | ICD-10-CM | POA: Insufficient documentation

## 2019-01-23 DIAGNOSIS — Z79899 Other long term (current) drug therapy: Secondary | ICD-10-CM | POA: Insufficient documentation

## 2019-01-23 DIAGNOSIS — I4819 Other persistent atrial fibrillation: Secondary | ICD-10-CM | POA: Diagnosis not present

## 2019-01-23 DIAGNOSIS — Z825 Family history of asthma and other chronic lower respiratory diseases: Secondary | ICD-10-CM | POA: Insufficient documentation

## 2019-01-23 DIAGNOSIS — I4891 Unspecified atrial fibrillation: Secondary | ICD-10-CM | POA: Insufficient documentation

## 2019-01-23 DIAGNOSIS — Z886 Allergy status to analgesic agent status: Secondary | ICD-10-CM | POA: Insufficient documentation

## 2019-01-23 DIAGNOSIS — Z79811 Long term (current) use of aromatase inhibitors: Secondary | ICD-10-CM | POA: Diagnosis not present

## 2019-01-23 DIAGNOSIS — Z170 Estrogen receptor positive status [ER+]: Secondary | ICD-10-CM | POA: Diagnosis not present

## 2019-01-23 DIAGNOSIS — M1990 Unspecified osteoarthritis, unspecified site: Secondary | ICD-10-CM | POA: Insufficient documentation

## 2019-01-23 DIAGNOSIS — Z885 Allergy status to narcotic agent status: Secondary | ICD-10-CM | POA: Diagnosis not present

## 2019-01-23 DIAGNOSIS — Z881 Allergy status to other antibiotic agents status: Secondary | ICD-10-CM | POA: Insufficient documentation

## 2019-01-23 DIAGNOSIS — R232 Flushing: Secondary | ICD-10-CM | POA: Insufficient documentation

## 2019-01-23 DIAGNOSIS — C50211 Malignant neoplasm of upper-inner quadrant of right female breast: Secondary | ICD-10-CM | POA: Diagnosis present

## 2019-01-23 DIAGNOSIS — Z85820 Personal history of malignant melanoma of skin: Secondary | ICD-10-CM | POA: Insufficient documentation

## 2019-01-23 MED ORDER — GABAPENTIN 300 MG PO CAPS: 300.0000 mg | ORAL_CAPSULE | Freq: Every day | ORAL | 4 refills | Status: DC

## 2019-01-25 ENCOUNTER — Telehealth: Payer: Self-pay | Admitting: Oncology

## 2019-01-25 NOTE — Telephone Encounter (Signed)
I left a message regarding schedule  

## 2019-01-29 ENCOUNTER — Ambulatory Visit
Admission: RE | Admit: 2019-01-29 | Discharge: 2019-01-29 | Disposition: A | Discharge status: Home / Self Care | Payer: Medicare Other | Source: Ambulatory Visit | Attending: Oncology | Admitting: Oncology

## 2019-01-29 ENCOUNTER — Other Ambulatory Visit: Payer: Self-pay

## 2019-01-29 DIAGNOSIS — Z1231 Encounter for screening mammogram for malignant neoplasm of breast: Secondary | ICD-10-CM

## 2019-02-02 ENCOUNTER — Other Ambulatory Visit: Payer: Self-pay | Admitting: Oncology

## 2019-02-02 DIAGNOSIS — R928 Other abnormal and inconclusive findings on diagnostic imaging of breast: Secondary | ICD-10-CM

## 2019-02-09 ENCOUNTER — Ambulatory Visit
Admission: RE | Admit: 2019-02-09 | Discharge: 2019-02-09 | Disposition: A | Discharge status: Home / Self Care | Payer: Medicare Other | Source: Ambulatory Visit | Attending: Oncology | Admitting: Oncology

## 2019-02-09 ENCOUNTER — Other Ambulatory Visit: Payer: Self-pay

## 2019-02-09 ENCOUNTER — Other Ambulatory Visit: Payer: Self-pay | Admitting: Oncology

## 2019-02-09 DIAGNOSIS — R928 Other abnormal and inconclusive findings on diagnostic imaging of breast: Secondary | ICD-10-CM

## 2019-03-08 ENCOUNTER — Other Ambulatory Visit: Payer: Self-pay | Admitting: Orthopedic Surgery

## 2019-03-08 DIAGNOSIS — Z96651 Presence of right artificial knee joint: Secondary | ICD-10-CM

## 2019-03-12 IMAGING — CR DG CHEST 2V
2 series · 2 of 2 positions shown · non-contrast
Comparison: 12/14/2016

CLINICAL DATA: Chest tightness

EXAM:
CHEST  2 VIEW

[chest lat]
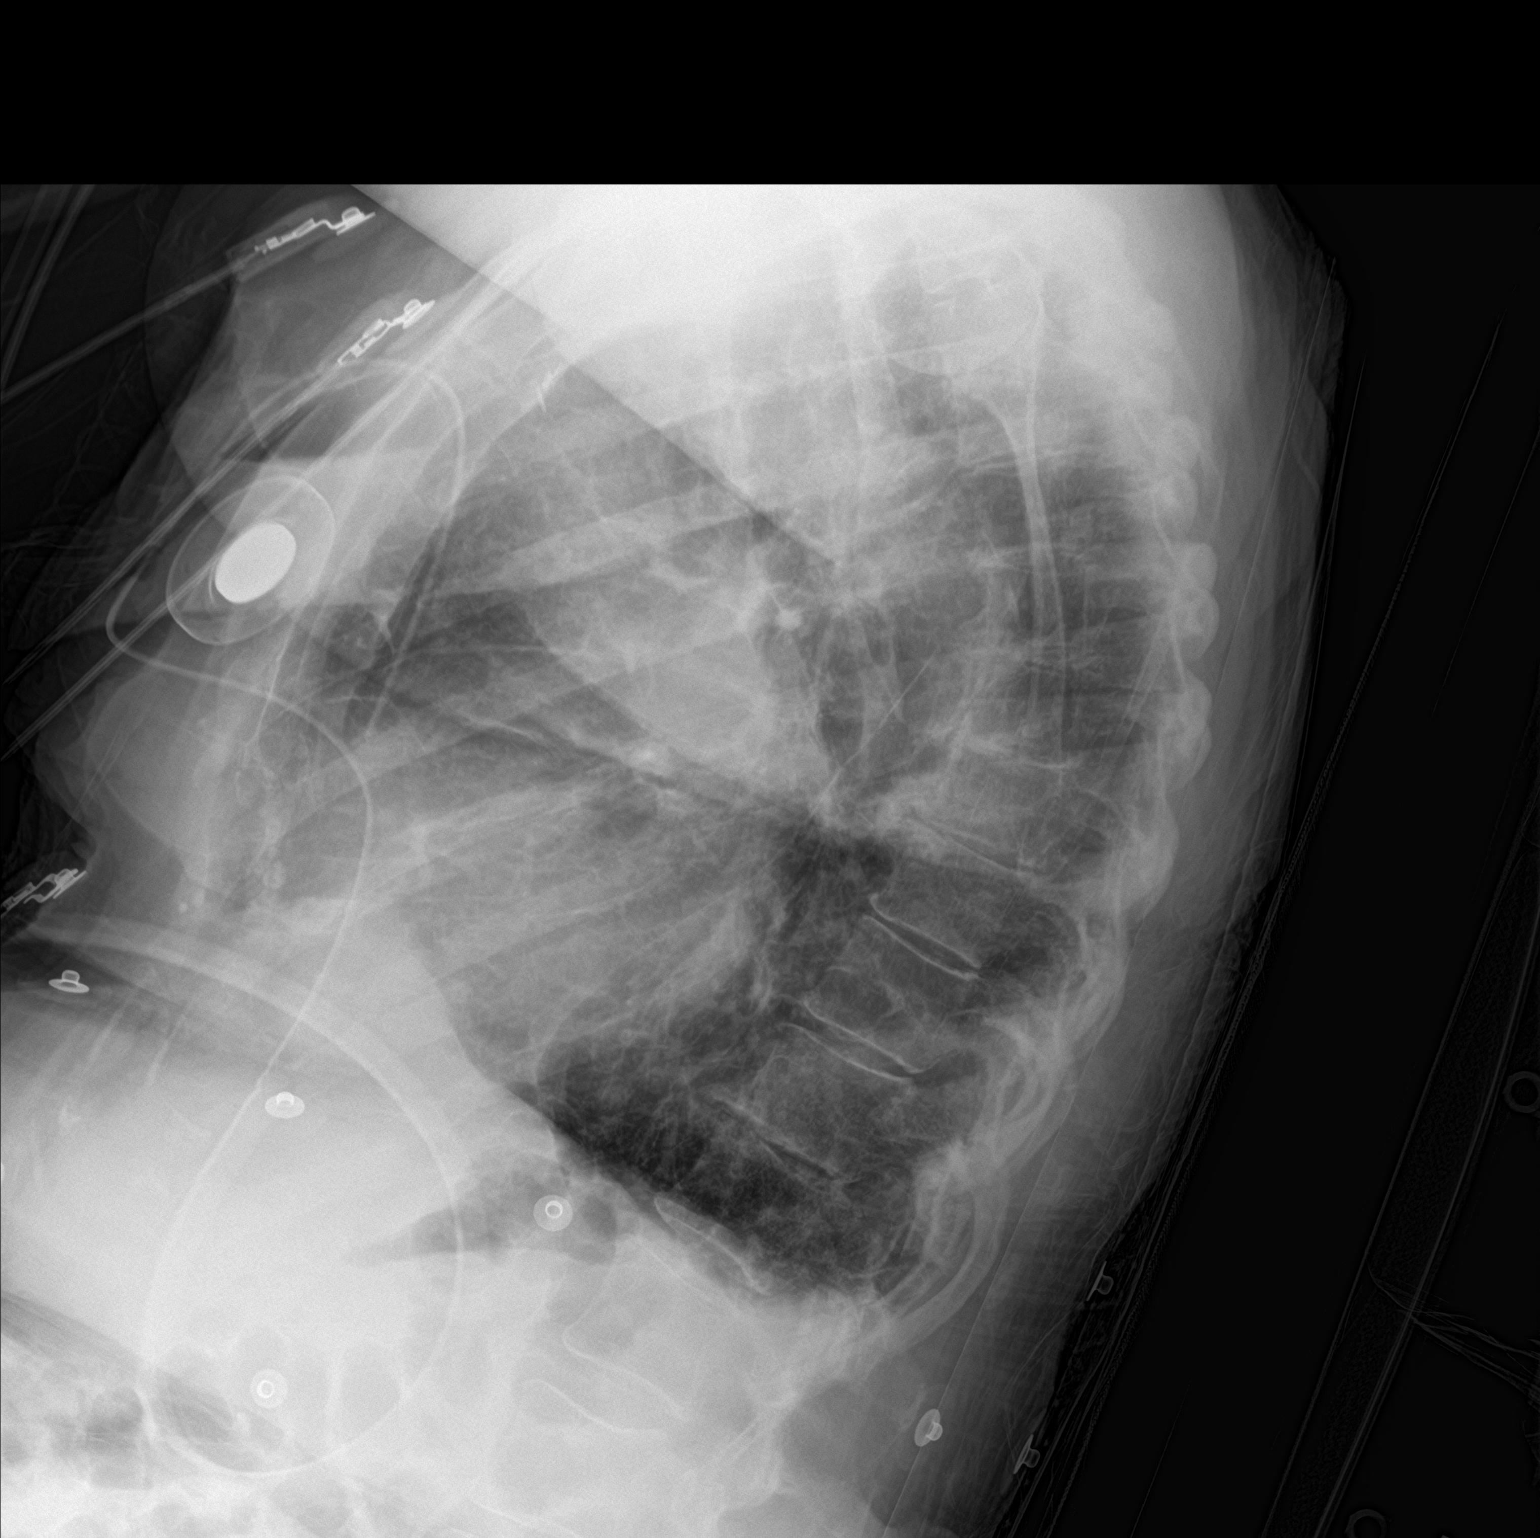

[chest ap]
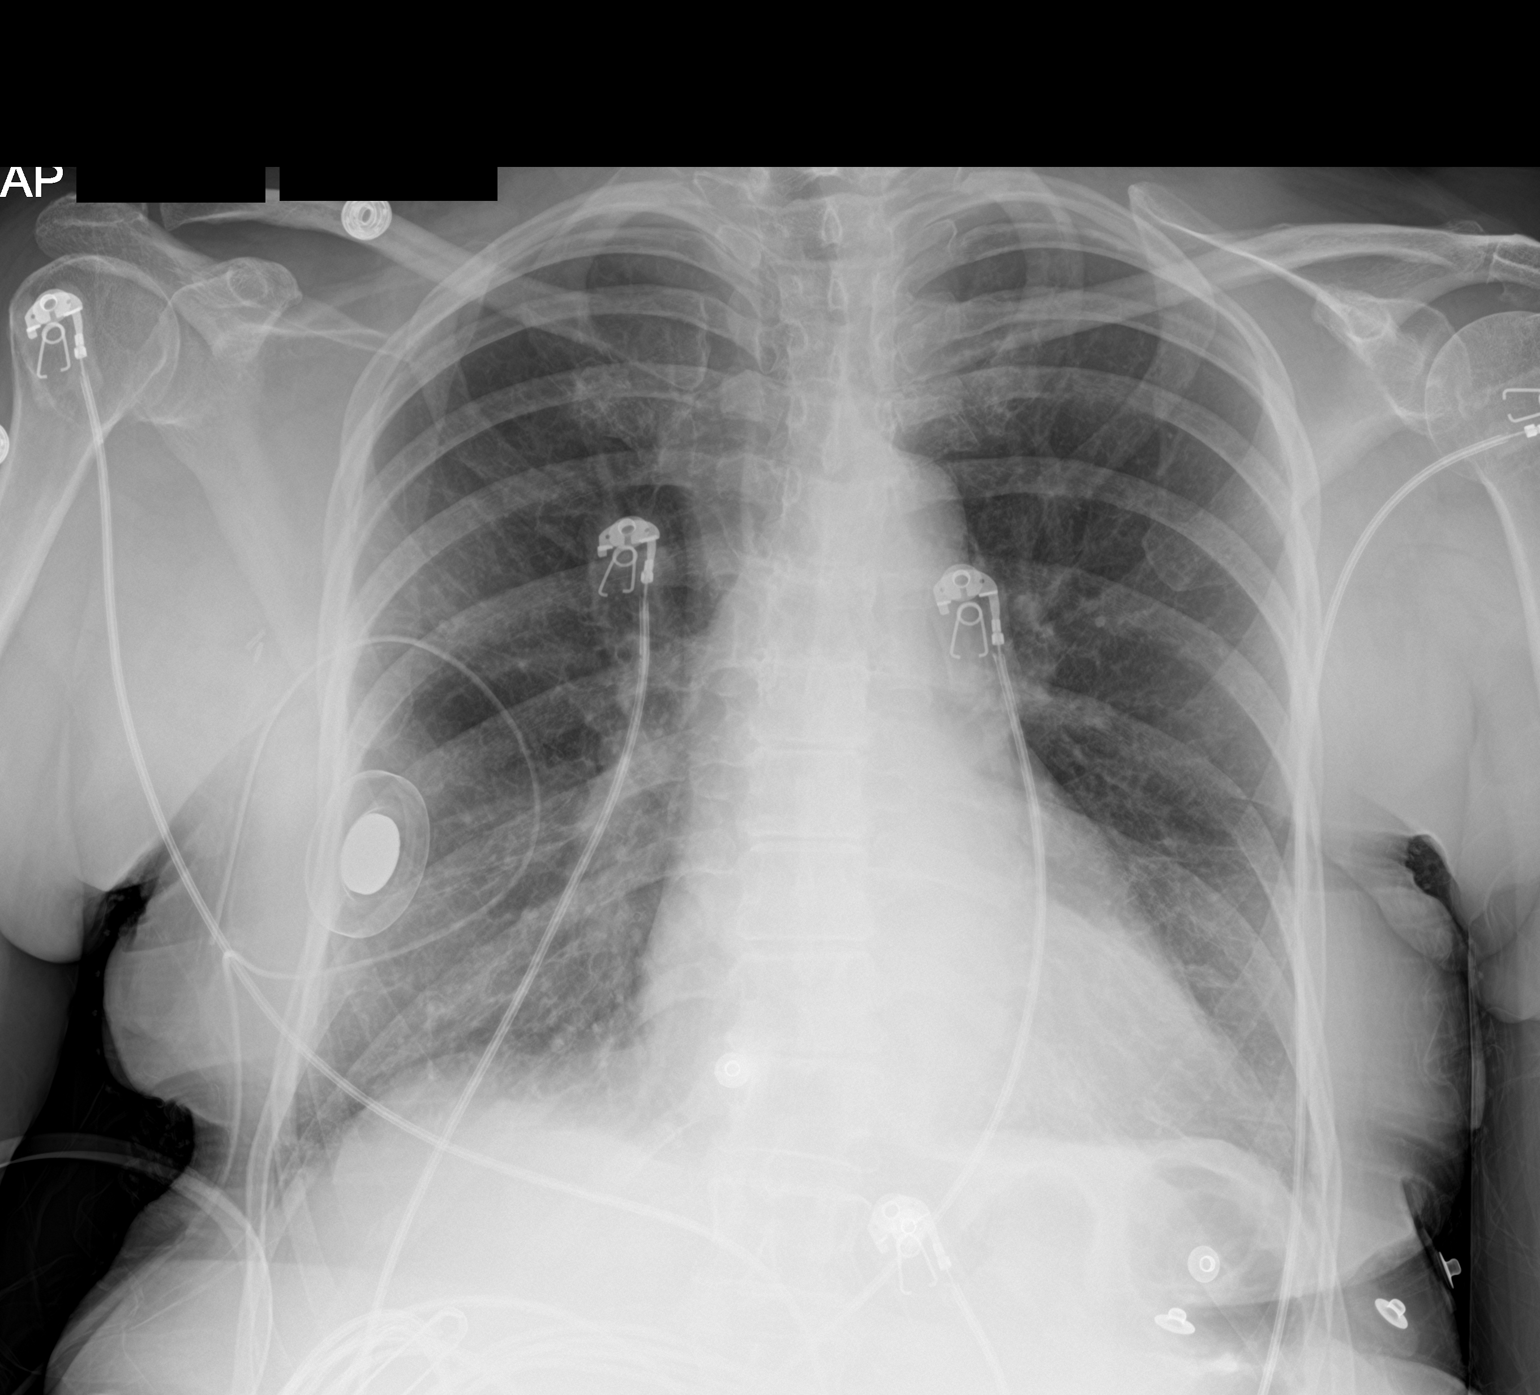

[2 of 2 positions shown; findings below may reference images not displayed]

FINDINGS: Cardiac shadow is within normal limits. The lungs are well aerated
bilaterally. No focal infiltrate or sizable effusion is seen. Breast
expander is noted on the right with a surgical drain in place. No
bony abnormality is noted.
IMPRESSION: Postsurgical changes without acute abnormality.

## 2019-03-15 ENCOUNTER — Ambulatory Visit
Admission: RE | Admit: 2019-03-15 | Discharge: 2019-03-15 | Disposition: A | Discharge status: Home / Self Care | Payer: Medicare Other | Source: Ambulatory Visit | Attending: Orthopedic Surgery | Admitting: Orthopedic Surgery

## 2019-03-15 ENCOUNTER — Other Ambulatory Visit: Payer: Self-pay

## 2019-03-15 DIAGNOSIS — Z96651 Presence of right artificial knee joint: Secondary | ICD-10-CM

## 2019-04-19 ENCOUNTER — Other Ambulatory Visit: Payer: Self-pay | Admitting: Oncology

## 2019-07-10 NOTE — Progress Notes (Signed)
HPI: FU atrial fibrillation.  Echocardiogram July 2018 showed ejection fraction 50%.  She has been managed with rate control and anticoagulation. Since last seen,  patient denies dyspnea, chest pain, palpitations, syncope or bleeding.  Current Outpatient Medications  Medication Sig Dispense Refill  . albuterol (PROVENTIL HFA;VENTOLIN HFA) 108 (90 Base) MCG/ACT inhaler Inhale 2 puffs into the lungs every 6 (six) hours as needed for wheezing or shortness of breath. For wheezing/shortness of breath    . ALPRAZolam (XANAX) 0.5 MG tablet Take 0.5 mg by mouth daily as needed for anxiety.     Marland Kitchen BREO ELLIPTA 100-25 MCG/INH AEPB Inhale 1 puff into the lungs daily.    Marland Kitchen desonide (DESOWEN) 0.05 % cream Apply 1 application topically 2 (two) times daily as needed (for skin irritation.).    Marland Kitchen diclofenac sodium (VOLTAREN) 1 % GEL Apply 2 g topically daily as needed (pain).     Marland Kitchen EPINEPHrine (EPIPEN 2-PAK) 0.3 mg/0.3 mL IJ SOAJ injection Inject 0.3 mg into the muscle once.    . famotidine (PEPCID) 20 MG tablet Take 1 tablet by mouth as directed.    . gabapentin (NEURONTIN) 300 MG capsule Take 1 capsule (300 mg total) by mouth at bedtime. 90 capsule 4  . letrozole (FEMARA) 2.5 MG tablet TAKE 1 TABLET BY MOUTH EVERY DAY 90 tablet 4  . levocetirizine (XYZAL) 5 MG tablet Take 5 mg by mouth at bedtime.     . methocarbamol (ROBAXIN) 500 MG tablet Take 1 tablet (500 mg total) by mouth every 8 (eight) hours as needed for muscle spasms. 30 tablet 0  . Polyethyl Glycol-Propyl Glycol (SYSTANE OP) Place 1 drop into both eyes daily.    . Potassium 99 MG TABS Take 99 mg by mouth daily.     . propranolol ER (INDERAL LA) 80 MG 24 hr capsule Take 80 mg by mouth daily.    Marland Kitchen triamterene-hydrochlorothiazide (MAXZIDE-25) 37.5-25 MG per tablet Take 1 tablet by mouth every morning.     . venlafaxine XR (EFFEXOR-XR) 75 MG 24 hr capsule TAKE 1 CAPSULE (75 MG TOTAL) BY MOUTH DAILY WITH BREAKFAST. 90 capsule 2  . XARELTO 20 MG  TABS tablet TAKE 1 TABLET (20 MG TOTAL) BY MOUTH DAILY WITH SUPPER. 35 tablet 5  . amLODipine (NORVASC) 5 MG tablet Take 1 tablet (5 mg total) by mouth daily. 90 tablet 3   No current facility-administered medications for this visit.     Past Medical History:  Diagnosis Date  . Anemia    history of  . Anxiety   . Arthritis   . Asthma   . Breast cancer ([place])   . Cancer ([place])    skin cancers, one melanoma   . Complication of anesthesia    patient woke up during colonoscopy  . Concussion    8 yrs. ago due to being thrown from a horse  . Dislocated elbow 10/12/2016   left  . Dysrhythmia 12/2016   A-fib  . Family history of breast cancer   . GERD (gastroesophageal reflux disease)   . History of kidney stones   . History of radiation therapy 03/23/17-05/04/17   right chest wall 50.4 Gy in 28 fractions, right axilla 45 Gy in 25 fractions  . Hypertension   . 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 Keratosis, actinic   . Melanoma ([place])   . Persistent atrial fibrillation ([place]) 12/16/2016   CHA2DS2VASC score 3  . Pneumonia    hx of  Past Surgical History:  Procedure Laterality Date  . brachial skin removal due to deforimity Bilateral   . BREAST ENHANCEMENT SURGERY Left 11/14/2018   Procedure: LEFT BREAST AUGMENTATION;  Surgeon: [name], MD;  Location: [place];  Service: Plastics;  Laterality: Left;  . BREAST RECONSTRUCTION Right 06/26/2018  . BREAST RECONSTRUCTION WITH PLACEMENT OF TISSUE EXPANDER AND ALLODERM Right 06/26/2018   Procedure: RIGHT BREAST RECONSTRUCTION WITH PLACEMENT OF TISSUE EXPANDER;  Surgeon: [name], MD;  Location: [place];  Service: Plastics;  Laterality: Right;  . BREAST RECONSTRUCTION WITH PLACEMENT OF TISSUE EXPANDER AND FLEX HD (ACELLULAR HYDRATED DERMIS) Right 01/28/2017    RIGHT BREAST RECONSTRUCTION WITH PLACEMENT OF TISSUE EXPANDER AND ALLODERM;  Surgeon: [name], MD;  Location: [place];  Service:  Plastics;  Laterality: Right;  . BUNIONECTOMY     left   . CATARACT EXTRACTION     bil[place]la cataract surgery   . COLONOSCOPY W/ POLYPECTOMY    . KNEE ARTHROSCOPY     left   . LATISSIMUS FLAP TO BREAST Right 06/26/2018   Procedure: LATISSIMUS FLAP TO RIGHT CHEST;  Surgeon: [name], MD;  Location: [place];  Service: Plastics;  Laterality: Right;  . MASTECTOMY W/ SENTINEL NODE BIOPSY Right 01/28/2017   Procedure: RIGHT TOTAL MASTECTOMY WITH SENTINEL LYMPH NODE BIOPSY;  Surgeon: Excell Seltzer, MD;  Location: [place];  Service: General;  Laterality: Right;  . MASTOPEXY Left 11/14/2018   Procedure: LEFT BREAST MASTOPEXY;  Surgeon: [name], MD;  Location: [place];  Service: Plastics;  Laterality: Left;  Marland Kitchen MELANOMA EXCISION Left   . polyp removed from uterus     . REMOVAL OF TISSUE EXPANDER AND PLACEMENT OF IMPLANT Right 11/14/2018   Procedure: REMOVAL OF RIGHT TISSUE EXPANDER AND PLACEMENT OF SALINE IMPLANT;  Surgeon: [name], MD;  Location: [place];  Service: Plastics;  Laterality: Right;  . right wrist pinning     . skin cancers removed    . TISSUE EXPANDER PLACEMENT Right 02/27/2018   Procedure: REMOVAL OF TISSUE EXPANDER;  Surgeon: [name], MD;  Location: [place];  Service: Plastics;  Laterality: Right;  . TOTAL KNEE ARTHROPLASTY Right 01/29/2013   Procedure: RIGHT TOTAL KNEE ARTHROPLASTY;  Surgeon: Gearlean Alf, MD;  Location: [facility];  Service: Orthopedics;  Laterality: Right;  . TOTAL KNEE ARTHROPLASTY Left 08/29/2017   Procedure: LEFT TOTAL KNEE ARTHROPLASTY;  Surgeon: Gaynelle Arabian, MD;  Location: [facility];  Service: Orthopedics;  Laterality: Left;    Social History   Socioeconomic History  . Marital status: Married    Spouse name: Not on file  . Number of children: Not on file  . Years of education: Not on file  . Highest education level: Not on file  Occupational History  . Not on file  Tobacco Use  .  Smoking status: Never Smoker  . Smokeless tobacco: Never Used  Substance and Sexual Activity  . Alcohol use: Yes    Comment: occ  . Drug use: No  . Sexual activity: Not on file  Other Topics Concern  . Not on file  Social History Narrative  . Not on file   Social Determinants of Health   Financial Resource Strain:   . Difficulty of Paying Living Expenses: Not on file  Food Insecurity:   . Worried About Charity fundraiser in the Last Year: Not on file  . Ran Out of Food in the Last Year: Not on file  Transportation Needs:   . Film/video editor (Medical): Not on file  . Lack of Transportation (Non-Medical): Not on file  Physical Activity:   . Days of Exercise per Week: Not on file  . Minutes of Exercise per Session: Not on file  Stress:   . Feeling of Stress : Not on file  Social Connections:   . Frequency of Communication with Friends and Family: Not on file  . Frequency of Social Gatherings with Friends and Family: Not on file  . Attends Religious Services: Not on file  . Active Member of Clubs or Organizations: Not on file  . Attends Archivist Meetings: Not on file  . Marital Status: Not on file  Intimate Partner Violence:   . Fear of Current or Ex-Partner: Not on file  . Emotionally Abused: Not on file  . Physically Abused: Not on file  . Sexually Abused: Not on file    Family History  Problem Relation Age of Onset  . COPD Father   . Breast cancer Other        dx in her 20s-30s    ROS: no fevers or chills, productive cough, hemoptysis, dysphasia, odynophagia, melena, hematochezia, dysuria, hematuria, rash, seizure activity, orthopnea, PND, pedal edema, claudication. Remaining systems are negative.  Physical Exam: Well-developed well-nourished in no acute distress.  Skin is warm and dry.  HEENT is normal.  Neck is supple.  Chest is clear to auscultation with normal expansion.  Cardiovascular exam is irregular Abdominal exam nontender or  distended. No masses palpated. Extremities show no edema. neuro grossly intact  Electrocardiogram today shows atrial fibrillation at a rate of 85, nonspecific ST changes.  Personally reviewed.  A/P  1 permanent atrial fibrillation-continue beta-blocker at present dose.  Continue Xarelto.  Check hemoglobin and renal function.  2 hypertension-blood pressure controlled.  Continue present medications and follow.  Kirk Ruths, MD

## 2019-07-17 ENCOUNTER — Ambulatory Visit (INDEPENDENT_AMBULATORY_CARE_PROVIDER_SITE_OTHER): Payer: Medicare Other | Admitting: Cardiology

## 2019-07-17 ENCOUNTER — Other Ambulatory Visit: Payer: Self-pay

## 2019-07-17 ENCOUNTER — Encounter: Payer: Self-pay | Admitting: Cardiology

## 2019-07-17 VITALS — BP 142/86 | HR 85 | Ht 65.0 in | Wt 155.6 lb

## 2019-07-17 DIAGNOSIS — I10 Essential (primary) hypertension: Secondary | ICD-10-CM

## 2019-07-17 DIAGNOSIS — I4819 Other persistent atrial fibrillation: Secondary | ICD-10-CM | POA: Diagnosis not present

## 2019-07-17 NOTE — Patient Instructions (Signed)
Medication Instructions:  NO CHANGE *If you need a refill on your cardiac medications before your next appointment, please call your pharmacy*  Lab Work: Your physician recommends that you HAVE LAB WORK TODAY If you have labs (blood work) drawn today and your tests are completely normal, you will receive your results only by: . MyChart Message (if you have MyChart) OR . A paper copy in the mail If you have any lab test that is abnormal or we need to change your treatment, we will call you to review the results.  Follow-Up: At [facility], you and your health needs are our priority.  As part of our continuing mission to provide you with exceptional heart care, we have created designated Provider Care Teams.  These Care Teams include your primary Cardiologist (physician) and Advanced Practice Providers (APPs -  Physician Assistants and Nurse Practitioners) who all work together to provide you with the care you need, when you need it.  Your next appointment:   6 month(s)  The format for your next appointment:   Either In Person or Virtual  Provider:   You may see BRIAN CRENSHAW MD or one of the following Advanced Practice Providers on your designated Care Team:    Luke Kilroy, PA-C  Callie Goodrich, PA-C  Jesse Cleaver, FNP    

## 2019-07-18 LAB — BASIC METABOLIC PANEL
BUN/Creatinine Ratio: 14 (ref 12–28)
BUN: 11 mg/dL (ref 8–27)
CO2: 22 mmol/L (ref 20–29)
Calcium: 10.4 mg/dL — ABNORMAL HIGH (ref 8.7–10.3)
Chloride: 92 mmol/L — ABNORMAL LOW (ref 96–106)
Creatinine, Ser: 0.79 mg/dL (ref 0.57–1.00)
GFR calc Af Amer: 84 mL/min/{1.73_m2} (ref >59)
GFR calc non Af Amer: 73 mL/min/{1.73_m2} (ref >59)
Glucose: 108 mg/dL — ABNORMAL HIGH (ref 65–99)
Potassium: 3.4 mmol/L — ABNORMAL LOW (ref 3.5–5.2)
Sodium: 139 mmol/L (ref 134–144)

## 2019-07-18 LAB — CBC
Hematocrit: 43.3 % (ref 34.0–46.6)
Hemoglobin: 14.8 g/dL (ref 11.1–15.9)
MCH: 33.7 pg — ABNORMAL HIGH (ref 26.6–33.0)
MCHC: 34.2 g/dL (ref 31.5–35.7)
MCV: 99 fL — ABNORMAL HIGH (ref 79–97)
Platelets: 300 10*3/uL (ref 150–450)
RBC: 4.39 x10E6/uL (ref 3.77–5.28)
RDW: 13 % (ref 11.7–15.4)
WBC: 9 10*3/uL (ref 3.4–10.8)

## 2019-09-24 ENCOUNTER — Other Ambulatory Visit: Payer: Self-pay | Admitting: Cardiology

## 2019-12-25 ENCOUNTER — Other Ambulatory Visit: Payer: Self-pay | Admitting: Cardiology

## 2020-01-17 ENCOUNTER — Other Ambulatory Visit: Payer: Self-pay | Admitting: Oncology

## 2020-01-22 ENCOUNTER — Other Ambulatory Visit: Payer: Self-pay | Admitting: Oncology

## 2020-01-22 DIAGNOSIS — Z1231 Encounter for screening mammogram for malignant neoplasm of breast: Secondary | ICD-10-CM

## 2020-02-12 ENCOUNTER — Ambulatory Visit: Payer: Medicare Other

## 2020-02-22 ENCOUNTER — Other Ambulatory Visit: Payer: Self-pay

## 2020-02-22 ENCOUNTER — Ambulatory Visit
Admission: RE | Admit: 2020-02-22 | Discharge: 2020-02-22 | Disposition: A | Discharge status: Home / Self Care | Payer: Medicare Other | Source: Ambulatory Visit | Attending: Oncology | Admitting: Oncology

## 2020-02-22 ENCOUNTER — Other Ambulatory Visit: Payer: Self-pay | Admitting: Oncology

## 2020-02-22 DIAGNOSIS — Z1231 Encounter for screening mammogram for malignant neoplasm of breast: Secondary | ICD-10-CM

## 2020-02-24 NOTE — Progress Notes (Signed)
[place]  Telephone:([phone]) [phone] Fax:(336) [phone]     ID: MYCHELLE KENDRA DOB: 1940-08-07  MR#: [id number]  [facility]#:[id number]  Patient Care Team: [name], MD as PCP - General (Family Medicine) Excell Seltzer, MD (Inactive) as Consulting Physician (General Surgery) Dagan Heinz, Virgie Dad, MD as Consulting Physician (Oncology) Gery Pray, MD as Consulting Physician (Radiation Oncology) Lorelle Gibbs, MD (Radiology) Gaynelle Arabian, MD as Consulting Physician (Orthopedic Surgery) Harold Hedge, Darrick Grinder, MD as Consulting Physician (Allergy and Immunology) Teena Irani, MD (Inactive) as Consulting Physician (Gastroenterology) [name], MD as Consulting Physician (Plastic Surgery) OTHER MD:  CHIEF COMPLAINT: Estrogen receptor positive breast cancer  CURRENT TREATMENT:  Letrozole   INTERVAL HISTORY: Katrenia is seen today for follow-up of her estrogen receptor positive breast cancer.   She continues on Letrozole.  She says her hot flashes are now much improved.  She does not have problems with vaginal dryness.  Tikia's last bone density screening on 12/11/2018, showed a T-score of -0.5, which is considered normal.    Since her last visit, she underwent bilateral diagnostic mammography with tomography at [facility] on 02/22/2020.  Results however have not yet been read.  REVIEW OF SYSTEMS: Kalea went to [place] with her husband and son and daughter-in-law.  She felt it only wonderful except that "everything in my body fell apart".  Her leg swelled to "like melons" in the airplane.  It took a long time for them to get back to normal size which finally they are doing.  In addition she fell and injured her face.  She still has headaches, although no visual changes.  She has some nausea which she attributes to GERD.  She exercises with a trainer twice a week but [facility]  She received the [place] vaccine x2 with no complications.   A detailed review of systems today was otherwise stable.   BREAST CANCER HISTORY: From the original intake note:  The patient has had concerns in the right breast dating back to about 3 years when her primary care physician Dr. [name] noted a change. The breast has been followed very closely but no diagnosis was established. She was followed with annual diagnostic mammography and on 11/11/2016 bilateral diagnostic mammography with tomography at [facility] found the breast density to be category C. There was a new 3.5 cm irregular spiculated mass in the right breast upper inner quadrant. Ultrasound was obtained the same day and confirmed a 3.5 cm solid mass in the right breast upper inner quadrant, with no abnormalities noted in the right axilla.  Biopsy of the right breast mass in question 11/15/2013 showed (SAA [id number] invasive lobular carcinoma, E-cadherin negative, grade 1 or 2, estrogen receptor 90% positive, progesterone receptor 2% positive, both with strong staining intensity, with an MIB-1 of 5, and no HER-2 amplification, the signals ratio being 1.14 and the number per cell 1.20.  Her subsequent history is as detailed below   PAST MEDICAL HISTORY: Past Medical History:  Diagnosis Date  . Anemia    history of  . Anxiety   . Arthritis   . Asthma   . Breast cancer ([place])   . Cancer ([place])    skin cancers, one melanoma   . Complication of anesthesia    patient woke up during colonoscopy  . Concussion    8 yrs. ago due to being thrown from a horse  . Dislocated elbow 10/12/2016   left  . Dysrhythmia 12/2016  A-fib  . Family history of breast cancer   . GERD (gastroesophageal reflux disease)   . History of kidney stones   . History of radiation therapy 03/23/17-05/04/17   right chest wall 50.4 Gy in 28 fractions, right axilla 45 Gy in 25 fractions  . Hypertension   . 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 Keratosis, actinic   . Melanoma ([place])   . Persistent atrial  fibrillation ([place]) 12/16/2016   CHA2DS2VASC score 3  . Pneumonia    hx of     PAST SURGICAL HISTORY: Past Surgical History:  Procedure Laterality Date  . brachial skin removal due to deforimity Bilateral   . BREAST ENHANCEMENT SURGERY Left 11/14/2018   Procedure: LEFT BREAST AUGMENTATION;  Surgeon: [name], MD;  Location: [place];  Service: Plastics;  Laterality: Left;  . BREAST RECONSTRUCTION Right 06/26/2018  . BREAST RECONSTRUCTION WITH PLACEMENT OF TISSUE EXPANDER AND ALLODERM Right 06/26/2018   Procedure: RIGHT BREAST RECONSTRUCTION WITH PLACEMENT OF TISSUE EXPANDER;  Surgeon: [name], MD;  Location: [place];  Service: Plastics;  Laterality: Right;  . BREAST RECONSTRUCTION WITH PLACEMENT OF TISSUE EXPANDER AND FLEX HD (ACELLULAR HYDRATED DERMIS) Right 01/28/2017    RIGHT BREAST RECONSTRUCTION WITH PLACEMENT OF TISSUE EXPANDER AND ALLODERM;  Surgeon: [name], MD;  Location: [place];  Service: Plastics;  Laterality: Right;  . BUNIONECTOMY     left   . CATARACT EXTRACTION     bilaterla cataract surgery   . COLONOSCOPY W/ POLYPECTOMY    . KNEE ARTHROSCOPY     left   . LATISSIMUS FLAP TO BREAST Right 06/26/2018   Procedure: LATISSIMUS FLAP TO RIGHT CHEST;  Surgeon: [name], MD;  Location: [place];  Service: Plastics;  Laterality: Right;  . MASTECTOMY Right   . MASTECTOMY W/ SENTINEL NODE BIOPSY Right 01/28/2017   Procedure: RIGHT TOTAL MASTECTOMY WITH SENTINEL LYMPH NODE BIOPSY;  Surgeon: Excell Seltzer, MD;  Location: [place];  Service: General;  Laterality: Right;  . MASTOPEXY Left 11/14/2018   Procedure: LEFT BREAST MASTOPEXY;  Surgeon: [name], MD;  Location: [place];  Service: Plastics;  Laterality: Left;  Marland Kitchen MELANOMA EXCISION Left   . polyp removed from uterus     . REMOVAL OF TISSUE EXPANDER AND PLACEMENT OF IMPLANT Right 11/14/2018   Procedure: REMOVAL OF RIGHT TISSUE EXPANDER AND PLACEMENT OF SALINE  IMPLANT;  Surgeon: [name], MD;  Location: [place];  Service: Plastics;  Laterality: Right;  . right wrist pinning     . skin cancers removed    . TISSUE EXPANDER PLACEMENT Right 02/27/2018   Procedure: REMOVAL OF TISSUE EXPANDER;  Surgeon: [name], MD;  Location: [place];  Service: Plastics;  Laterality: Right;  . TOTAL KNEE ARTHROPLASTY Right 01/29/2013   Procedure: RIGHT TOTAL KNEE ARTHROPLASTY;  Surgeon: Gearlean Alf, MD;  Location: [facility];  Service: Orthopedics;  Laterality: Right;  . TOTAL KNEE ARTHROPLASTY Left 08/29/2017   Procedure: LEFT TOTAL KNEE ARTHROPLASTY;  Surgeon: Gaynelle Arabian, MD;  Location: [facility];  Service: Orthopedics;  Laterality: Left;    FAMILY HISTORY Family History  Problem Relation Age of Onset  . COPD Father   . Breast cancer Other        dx in her 72s-30s  The patient's father died at the age of 81 from complications of emphysema and alcohol abuse. The patient's mother died from heart disease at age 94. The patient was an only child. The  only relative with breast cancer was a maternal great aunt was diagnosed in her 30s. There is no history of ovarian cancer in the family.   GYNECOLOGIC HISTORY:  No LMP recorded. Patient is postmenopausal. Menarche age 71, first live birth age 60, she is GX P1. She stopped having periods in 1994. She was using intravaginal Premarin until her breast cancer diagnosis June 2018. She used oral contraceptives for approximately one year remotely with no complications.   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 Her son from her first marriage, Tracey Harries, lives in [place] and is an Copywriter, advertising. He has 2 children. The patient's second husband, Merlyn Albert, is a retired Research scientist (medical). He has a son in [place], who works as a Engineer, civil (consulting). Merlyn Albert has 5 grandchildren of his own. The patient is a Control and instrumentation engineer.    ADVANCED DIRECTIVES: In place. The patient tells me she has named her  husband Merlyn Albert and her son Tawanna Cooler as healthcare part of attorney   HEALTH MAINTENANCE: Social History   Tobacco Use  . Smoking status: Never Smoker  . Smokeless tobacco: Never Used  Vaping Use  . Vaping Use: Never used  Substance Use Topics  . Alcohol use: Yes    Comment: occ  . Drug use: No     Colonoscopy:2015  PAP:  Bone density: 01/27/[facility] T score -0.3   Allergies  Allergen Reactions  . Chlorhexidine Gluconate Hives, Swelling and Rash       "ChloraPrep "  . Doxycycline Nausea And Vomiting  . Nsaids     avoid due to gerd  . Tylenol [Acetaminophen]     Avoid due to GERD  . Adhesive [Tape] Rash    Dermabond  . Codeine Nausea Only    Current Outpatient Medications  Medication Sig Dispense Refill  . albuterol (PROVENTIL HFA;VENTOLIN HFA) 108 (90 Base) MCG/ACT inhaler Inhale 2 puffs into the lungs every 6 (six) hours as needed for wheezing or shortness of breath. For wheezing/shortness of breath    . ALPRAZolam (XANAX) 0.5 MG tablet Take 0.5 mg by mouth daily as needed for anxiety.     Marland Kitchen amLODipine (NORVASC) 5 MG tablet TAKE 1 TABLET BY MOUTH EVERY DAY 90 tablet 1  . BREO ELLIPTA 100-25 MCG/INH AEPB Inhale 1 puff into the lungs daily.    Marland Kitchen desonide (DESOWEN) 0.05 % cream Apply 1 application topically 2 (two) times daily as needed (for skin irritation.).    Marland Kitchen diclofenac sodium (VOLTAREN) 1 % GEL Apply 2 g topically daily as needed (pain).     Marland Kitchen EPINEPHrine (EPIPEN 2-PAK) 0.3 mg/0.3 mL IJ SOAJ injection Inject 0.3 mg into the muscle once.    . famotidine (PEPCID) 20 MG tablet Take 1 tablet by mouth as directed.    . gabapentin (NEURONTIN) 300 MG capsule Take 1 capsule (300 mg total) by mouth at bedtime. 90 capsule 4  . letrozole (FEMARA) 2.5 MG tablet TAKE 1 TABLET BY MOUTH EVERY DAY 90 tablet 4  . levocetirizine (XYZAL) 5 MG tablet Take 5 mg by mouth at bedtime.     . methocarbamol (ROBAXIN) 500 MG tablet Take 1 tablet (500 mg total) by mouth every 8  (eight) hours as needed for muscle spasms. 30 tablet 0  . Polyethyl Glycol-Propyl Glycol (SYSTANE OP) Place 1 drop into both eyes daily.    . Potassium 99 MG TABS Take 99 mg by mouth daily.     . propranolol ER (INDERAL LA) 80 MG 24  hr capsule Take 80 mg by mouth daily.    Marland Kitchen triamterene-hydrochlorothiazide (MAXZIDE-25) 37.5-25 MG per tablet Take 1 tablet by mouth every morning.     . venlafaxine XR (EFFEXOR-XR) 75 MG 24 hr capsule TAKE 1 CAPSULE (75 MG TOTAL) BY MOUTH DAILY WITH BREAKFAST. 90 capsule 2  . XARELTO 20 MG TABS tablet TAKE 1 TABLET (20 MG TOTAL) BY MOUTH DAILY WITH SUPPER. 90 tablet 1   No current facility-administered medications for this visit.    OBJECTIVE: White woman who appears stated age 41:   02/25/20 1349  BP: 126/81  Pulse: 79  Resp: 18  Temp: (!) 97.4 F (36.3 C)  SpO2: 94%     Body mass index is 25.64 kg/m.   Filed Weights   02/25/20 1349  Weight: 154 lb 1.6 oz (69.9 kg)    ECOG FS:1 - Symptomatic but completely ambulatory  Sclerae unicteric, EOMs intact Wearing a mask No cervical or supraclavicular adenopathy Lungs no rales or rhonchi Heart regular rate and rhythm Abd soft, obese, nontender, positive bowel sounds MSK no focal spinal tenderness, no upper extremity lymphedema; tenderness to palpation around the right orbit area where there is an visible bruise Neuro: nonfocal, well oriented, appropriate affect Breasts: The right breast has undergone mastectomy with saline implant placement.  There is no evidence of local recurrence.  The left breast is status post implant placement.  There are no suspicious findings.  Both axillae are benign.   LAB RESULTS:  CMP     Component Value Date/Time   NA 139 07/17/2019 1420   NA 134 (L) 04/25/2017 1216   K 3.4 (L) 07/17/2019 1420   K 4.3 04/25/2017 1216   CL 92 (L) 07/17/2019 1420   CO2 22 07/17/2019 1420   CO2 29 04/25/2017 1216   GLUCOSE 108 (H) 07/17/2019 1420   GLUCOSE 97 11/10/2018 1509     GLUCOSE 85 04/25/2017 1216   BUN 11 07/17/2019 1420   BUN 14.4 04/25/2017 1216   CREATININE 0.79 07/17/2019 1420   CREATININE 0.72 01/17/2018 1347   CREATININE 0.7 04/25/2017 1216   CALCIUM 10.4 (H) 07/17/2019 1420   CALCIUM 10.0 04/25/2017 1216   PROT 7.5 01/17/2018 1347   PROT 7.2 04/25/2017 1216   ALBUMIN 3.5 01/17/2018 1347   ALBUMIN 3.7 04/25/2017 1216   AST 20 01/17/2018 1347   AST 21 04/25/2017 1216   ALT 10 01/17/2018 1347   ALT 10 04/25/2017 1216   ALKPHOS 109 01/17/2018 1347   ALKPHOS 75 04/25/2017 1216   BILITOT 0.5 01/17/2018 1347   BILITOT 0.65 04/25/2017 1216   GFRNONAA 73 07/17/2019 1420   GFRNONAA >60 01/17/2018 1347   GFRAA 84 07/17/2019 1420   GFRAA >60 01/17/2018 1347    No results found for: TOTALPROTELP, ALBUMINELP, A1GS, A2GS, BETS, BETA2SER, GAMS, MSPIKE, SPEI  No results found for: KPAFRELGTCHN, LAMBDASER, [facility]  Lab Results  Component Value Date   WBC 8.4 02/25/2020   NEUTROABS 5.8 02/25/2020   HGB 13.1 02/25/2020   HCT 38.6 02/25/2020   MCV 101.6 (H) 02/25/2020   PLT 308 02/25/2020      Chemistry      Component Value Date/Time   NA 139 07/17/2019 1420   NA 134 (L) 04/25/2017 1216   K 3.4 (L) 07/17/2019 1420   K 4.3 04/25/2017 1216   CL 92 (L) 07/17/2019 1420   CO2 22 07/17/2019 1420   CO2 29 04/25/2017 1216   BUN 11 07/17/2019 1420   BUN 14.4 04/25/2017  1216   CREATININE 0.79 07/17/2019 1420   CREATININE 0.72 01/17/2018 1347   CREATININE 0.7 04/25/2017 1216      Component Value Date/Time   CALCIUM 10.4 (H) 07/17/2019 1420   CALCIUM 10.0 04/25/2017 1216   ALKPHOS 109 01/17/2018 1347   ALKPHOS 75 04/25/2017 1216   AST 20 01/17/2018 1347   AST 21 04/25/2017 1216   ALT 10 01/17/2018 1347   ALT 10 04/25/2017 1216   BILITOT 0.5 01/17/2018 1347   BILITOT 0.65 04/25/2017 1216       No results found for: LABCA2  No components found for: BDZHGD924  No results for input(s): INR in the last 168 hours.  Urinalysis     Component Value Date/Time   COLORURINE YELLOW 01/22/2013 1345   APPEARANCEUR CLEAR 01/22/2013 1345   LABSPEC 1.011 01/22/2013 1345   PHURINE 6.5 01/22/2013 1345   GLUCOSEU NEGATIVE 01/22/2013 1345   HGBUR TRACE (A) 01/22/2013 1345   BILIRUBINUR NEGATIVE 01/22/2013 1345   KETONESUR NEGATIVE 01/22/2013 1345   PROTEINUR NEGATIVE 01/22/2013 1345   UROBILINOGEN 0.2 01/22/2013 1345   NITRITE NEGATIVE 01/22/2013 1345   LEUKOCYTESUR NEGATIVE 01/22/2013 1345    STUDIES: No results found.   ELIGIBLE FOR AVAILABLE RESEARCH PROTOCOL: no  ASSESSMENT: 79 y.o. [place] woman status post right breast upper outer quadrant biopsy 11/15/2016 for a clinical T2 N0, stage IB invasive lobular carcinoma, grade 1 or 2, estrogen and progesterone receptor positive, HER-2 nonamplified, with an MIB-1 of 5%.  (1) genetics testing 12/02/2016 through the hereditary Gene Panel offered by [facility] found no deleterious mutations in APC, ATM, AXIN2, BARD1, BMPR1A, BRCA1, BRCA2, BRIP1, CDH1, CDKN2A (p14ARF), CDKN2A (p16INK4a), CHEK2, CTNNA1, DICER1, EPCAM (Deletion/duplication testing only), GREM1 (promoter region deletion/duplication testing only), KIT, MEN1, MLH1, MSH2, MSH3, MSH6, MUTYH, NBN, NF1, NHTL1, PALB2, PDGFRA, PMS2, POLD1, POLE, PTEN, RAD50, RAD51C, RAD51D, SDHB, SDHC, SDHD, SMAD4, SMARCA4. STK11, TP53, TSC1, TSC2, and VHL.  The following genes were evaluated for sequence changes only: SDHA and HOXB13 c.251G>A variant only.    (2) anastrozole started 11/24/2016, changed to letrozole September 2018  (3) Status post right mastectomy with sentinel lymph node sampling 01/28/2017 for apT3 pN0, invasive lobular carcinoma, grade 1, with negative margins.   (a) status post right saline implant placement and left mastopexy 11/14/2018   (4) Oncotype DX score of 21 predicts a 10 year risk of recurrence outside the breast of 14% if the patient's only systemic therapy is tamoxifen for 5 years. It also predicts no  significant benefit from chemotherapy.  (5) adjuvant radiation completed 05/04/2017 Site/dose: 1. CW_Rt/50.4 Gy in 28 fractions  2. Axilla Rt/45 Gy in 25 fractions  (6) continue letrozole, 7 year treatment planned (to June 2025)  (a) bone density August 2016 was normal  (b) [facility] 11/22/2016 showed a T score of 0.0 (normal)  (c) bone density at [facility] July 2020 showed a T score of -0.5 (normal).   PLAN: Dayelin is now just over 3 years out from definitive surgery for her breast cancer with no evidence of disease recurrence.  This is very favorable.  She is tolerating letrozole well and given that her tumor is lobular we are going to continue that drug for 7 years.  It is unfortunate that she had so many difficulties during her trip to [place], which she nevertheless enjoyed.  I am going to obtain noncontrast CT of the head and orbits just to make sure she does not have a fracture.  Otherwise she will see  me again in a year.  She will be due for repeat bone density at that time.  She knows to call for any other issue that may develop before the next visit.    Total encounter time 25 minutes.*  Allan Minotti, Virgie Dad, MD  02/25/20 1:59 PM Medical Oncology and Hematology [facility] [place], [place] [address] Tel. [phone]    Fax. [phone]   I, Wilburn Mylar, am acting as scribe for Dr. Virgie Dad. Jessi Pitstick.  I, Lurline Del MD, have reviewed the above documentation for accuracy and completeness, and I agree with the above.    *Total Encounter Time as defined by the Centers for Medicare and Medicaid Services includes, in addition to the face-to-face time of a patient visit (documented in the note above) non-face-to-face time: obtaining and reviewing outside history, ordering and reviewing medications, tests or procedures, care coordination (communications with other health care professionals or caregivers) and documentation in  the medical record.

## 2020-02-25 ENCOUNTER — Inpatient Hospital Stay: Payer: Medicare Other | Attending: Oncology | Admitting: Oncology

## 2020-02-25 ENCOUNTER — Other Ambulatory Visit: Payer: Self-pay

## 2020-02-25 ENCOUNTER — Inpatient Hospital Stay: Payer: Medicare Other

## 2020-02-25 ENCOUNTER — Telehealth: Payer: Self-pay | Admitting: Oncology

## 2020-02-25 VITALS — BP 126/81 | HR 79 | Temp 97.4°F | Resp 18 | Ht 65.0 in | Wt 154.1 lb

## 2020-02-25 DIAGNOSIS — Z79811 Long term (current) use of aromatase inhibitors: Secondary | ICD-10-CM | POA: Insufficient documentation

## 2020-02-25 DIAGNOSIS — R232 Flushing: Secondary | ICD-10-CM | POA: Diagnosis not present

## 2020-02-25 DIAGNOSIS — I4891 Unspecified atrial fibrillation: Secondary | ICD-10-CM | POA: Diagnosis not present

## 2020-02-25 DIAGNOSIS — Z803 Family history of malignant neoplasm of breast: Secondary | ICD-10-CM | POA: Diagnosis not present

## 2020-02-25 DIAGNOSIS — Z923 Personal history of irradiation: Secondary | ICD-10-CM | POA: Insufficient documentation

## 2020-02-25 DIAGNOSIS — Z85820 Personal history of malignant melanoma of skin: Secondary | ICD-10-CM | POA: Insufficient documentation

## 2020-02-25 DIAGNOSIS — I4819 Other persistent atrial fibrillation: Secondary | ICD-10-CM | POA: Diagnosis not present

## 2020-02-25 DIAGNOSIS — C50211 Malignant neoplasm of upper-inner quadrant of right female breast: Secondary | ICD-10-CM

## 2020-02-25 DIAGNOSIS — Z79899 Other long term (current) drug therapy: Secondary | ICD-10-CM | POA: Insufficient documentation

## 2020-02-25 DIAGNOSIS — Z885 Allergy status to narcotic agent status: Secondary | ICD-10-CM | POA: Insufficient documentation

## 2020-02-25 DIAGNOSIS — Z888 Allergy status to other drugs, medicaments and biological substances status: Secondary | ICD-10-CM | POA: Diagnosis not present

## 2020-02-25 DIAGNOSIS — Z886 Allergy status to analgesic agent status: Secondary | ICD-10-CM | POA: Diagnosis not present

## 2020-02-25 DIAGNOSIS — Z170 Estrogen receptor positive status [ER+]: Secondary | ICD-10-CM

## 2020-02-25 DIAGNOSIS — Z7901 Long term (current) use of anticoagulants: Secondary | ICD-10-CM | POA: Insufficient documentation

## 2020-02-25 DIAGNOSIS — Z836 Family history of other diseases of the respiratory system: Secondary | ICD-10-CM | POA: Diagnosis not present

## 2020-02-25 DIAGNOSIS — Z881 Allergy status to other antibiotic agents status: Secondary | ICD-10-CM | POA: Diagnosis not present

## 2020-02-25 LAB — CBC WITH DIFFERENTIAL/PLATELET
Abs Immature Granulocytes: 0.03 10*3/uL (ref 0.00–0.07)
Basophils Absolute: 0.1 10*3/uL (ref 0.0–0.1)
Basophils Relative: 1 %
Eosinophils Absolute: 0.2 10*3/uL (ref 0.0–0.5)
Eosinophils Relative: 2 %
HCT: 38.6 % (ref 36.0–46.0)
Hemoglobin: 13.1 g/dL (ref 12.0–15.0)
Immature Granulocytes: 0 %
Lymphocytes Relative: 14 %
Lymphs Abs: 1.2 10*3/uL (ref 0.7–4.0)
MCH: 34.5 pg — ABNORMAL HIGH (ref 26.0–34.0)
MCHC: 33.9 g/dL (ref 30.0–36.0)
MCV: 101.6 fL — ABNORMAL HIGH (ref 80.0–100.0)
Monocytes Absolute: 1.1 10*3/uL — ABNORMAL HIGH (ref 0.1–1.0)
Monocytes Relative: 14 %
Neutro Abs: 5.8 10*3/uL (ref 1.7–7.7)
Neutrophils Relative %: 69 %
Platelets: 308 10*3/uL (ref 150–400)
RBC: 3.8 MIL/uL — ABNORMAL LOW (ref 3.87–5.11)
RDW: 13.8 % (ref 11.5–15.5)
WBC: 8.4 10*3/uL (ref 4.0–10.5)
nRBC: 0 % (ref 0.0–0.2)

## 2020-02-25 LAB — COMPREHENSIVE METABOLIC PANEL
ALT: 26 U/L (ref 0–44)
AST: 38 U/L (ref 15–41)
Albumin: 3.4 g/dL — ABNORMAL LOW (ref 3.5–5.0)
Alkaline Phosphatase: 126 U/L (ref 38–126)
Anion gap: 11 (ref 5–15)
BUN: 8 mg/dL (ref 8–23)
CO2: 29 mmol/L (ref 22–32)
Calcium: 9.3 mg/dL (ref 8.9–10.3)
Chloride: 91 mmol/L — ABNORMAL LOW (ref 98–111)
Creatinine, Ser: 0.71 mg/dL (ref 0.44–1.00)
GFR calc Af Amer: >60 mL/min (ref >60)
GFR calc non Af Amer: >60 mL/min (ref >60)
Glucose, Bld: 99 mg/dL (ref 70–99)
Potassium: 3.2 mmol/L — ABNORMAL LOW (ref 3.5–5.1)
Sodium: 131 mmol/L — ABNORMAL LOW (ref 135–145)
Total Bilirubin: 0.9 mg/dL (ref 0.3–1.2)
Total Protein: 7.3 g/dL (ref 6.5–8.1)

## 2020-02-25 MED ORDER — LETROZOLE 2.5 MG PO TABS: 2.5000 mg | ORAL_TABLET | Freq: Every day | ORAL | 4 refills | Status: DC

## 2020-02-25 NOTE — Telephone Encounter (Signed)
Scheduled appts per 9/20 los. Gave pt a print out of AVS.  

## 2020-02-25 NOTE — Addendum Note (Signed)
Addended by: Chauncey Cruel on: 02/25/2020 02:24 PM   Modules accepted: Orders

## 2020-02-29 ENCOUNTER — Other Ambulatory Visit: Payer: Self-pay

## 2020-02-29 ENCOUNTER — Ambulatory Visit (HOSPITAL_COMMUNITY)
Admission: RE | Admit: 2020-02-29 | Discharge: 2020-02-29 | Disposition: A | Discharge status: Home / Self Care | Payer: Medicare Other | Source: Ambulatory Visit | Attending: Oncology | Admitting: Oncology

## 2020-02-29 DIAGNOSIS — I4819 Other persistent atrial fibrillation: Secondary | ICD-10-CM

## 2020-02-29 DIAGNOSIS — Z170 Estrogen receptor positive status [ER+]: Secondary | ICD-10-CM

## 2020-02-29 DIAGNOSIS — C50211 Malignant neoplasm of upper-inner quadrant of right female breast: Secondary | ICD-10-CM

## 2020-03-19 ENCOUNTER — Telehealth: Payer: Self-pay | Admitting: *Deleted

## 2020-03-19 NOTE — Telephone Encounter (Signed)
Patient with diagnosis of afib on Xarelto for anticoagulation.    Procedure: colonoscopy Date of procedure: 05/21/20  CHADS2-VASc score of 4 (age x2, sex, HTN)  CrCl 25mL/min Platelet count 308K  Per office protocol, patient can hold Xarelto for up to 2 days prior to procedure. Should not require 3 day hold for colonoscopy procedure.

## 2020-03-19 NOTE — Telephone Encounter (Signed)
° °  [facility] Pre-operative Risk Assessment     Request for surgical clearance:  1. What type of surgery is being performed? COLONOSCOPY  2. When is this surgery scheduled? 05/21/20  3. What type of clearance is required (medical clearance vs. Pharmacy clearance to hold med vs. Both)? BOTH  4. Are there any medications that need to be held prior to surgery and how long? XARELTO HOLD FOR 3 DAYS PRIOR  5. Practice name and name of physician performing surgery?  [facility]  6. What is the office phone number?  [phone]   7.   What is the office fax number?  [phone]  8.   Anesthesia type (None, local, MAC, general) ? PROPOFOL   [name] 03/19/2020, 3:18 PM  _________________________________________________________________   (provider comments below)

## 2020-03-20 ENCOUNTER — Telehealth: Payer: Self-pay | Admitting: Oncology

## 2020-03-20 NOTE — Telephone Encounter (Signed)
Release: [id number] Faxed medical records to [facility] @ [phone]

## 2020-03-20 NOTE — Telephone Encounter (Signed)
Patient has follow-up appointment with you 04/08/2020.  Colonoscopy scheduled for 05/21/2020.  States that she traveled to [place] and has noticed some increased shortness of breath over the last month.  She states that she also had some increased lower extremity edema which is now subsided.  No other cardiac complaints.  I will defer cardiac clearance to you pending follow-up visit.  Please submit/send office note to requesting office if cleared.  I will remove patient from CV DIV preop pool.   Thank you for your help.  Jossie Ng. Leanna Hamid NP-C    03/20/2020, [address] [address] Office [phone] Fax [phone]

## 2020-03-22 ENCOUNTER — Other Ambulatory Visit: Payer: Self-pay | Admitting: Cardiology

## 2020-04-02 NOTE — Progress Notes (Signed)
HPI: FU atrial fibrillation. Echocardiogram July 2018 showed ejection fraction 50%. She has been managed with rate control and anticoagulation. Since last seen,patient took a trip to [place] in August.  While over there she developed bilateral lower extremity edema.  She also developed significant dyspnea on exertion.  No orthopnea or PND.  She had some chest tightness with her dyspnea.  No syncope.  Occasional palpitations.  Upon returning home she has taken a supplement that has caused her edema to improve.  However she still has some dyspnea on exertion though better than when she was in [place].  She states that she has had some hematochezia.  Current Outpatient Medications  Medication Sig Dispense Refill  . albuterol (PROVENTIL HFA;VENTOLIN HFA) 108 (90 Base) MCG/ACT inhaler Inhale 2 puffs into the lungs every 6 (six) hours as needed for wheezing or shortness of breath. For wheezing/shortness of breath    . ALPRAZolam (XANAX) 0.5 MG tablet Take 0.5 mg by mouth daily as needed for anxiety.     Marland Kitchen amLODipine (NORVASC) 5 MG tablet TAKE 1 TABLET BY MOUTH EVERY DAY 90 tablet 1  . BREO ELLIPTA 100-25 MCG/INH AEPB Inhale 1 puff into the lungs daily.    Marland Kitchen desonide (DESOWEN) 0.05 % cream Apply 1 application topically 2 (two) times daily as needed (for skin irritation.).    Marland Kitchen diclofenac sodium (VOLTAREN) 1 % GEL Apply 2 g topically daily as needed (pain).     Marland Kitchen EPINEPHrine (EPIPEN 2-PAK) 0.3 mg/0.3 mL IJ SOAJ injection Inject 0.3 mg into the muscle once.    . famotidine (PEPCID) 20 MG tablet Take 1 tablet by mouth as directed.    . gabapentin (NEURONTIN) 300 MG capsule Take 1 capsule (300 mg total) by mouth at bedtime. 90 capsule 4  . letrozole (FEMARA) 2.5 MG tablet Take 1 tablet (2.5 mg total) by mouth daily. 90 tablet 4  . levocetirizine (XYZAL) 5 MG tablet Take 5 mg by mouth at bedtime.     . methocarbamol (ROBAXIN) 500 MG tablet Take 1 tablet (500 mg total) by mouth every 8 (eight) hours as  needed for muscle spasms. 30 tablet 0  . Polyethyl Glycol-Propyl Glycol (SYSTANE OP) Place 1 drop into both eyes daily.    . Potassium 99 MG TABS Take 99 mg by mouth daily.     . propranolol ER (INDERAL LA) 80 MG 24 hr capsule Take 80 mg by mouth daily.    Marland Kitchen triamterene-hydrochlorothiazide (MAXZIDE-25) 37.5-25 MG per tablet Take 1 tablet by mouth every morning.     . venlafaxine XR (EFFEXOR-XR) 75 MG 24 hr capsule TAKE 1 CAPSULE (75 MG TOTAL) BY MOUTH DAILY WITH BREAKFAST. 90 capsule 2  . XARELTO 20 MG TABS tablet TAKE 1 TABLET (20 MG TOTAL) BY MOUTH DAILY WITH SUPPER. 90 tablet 1   No current facility-administered medications for this visit.     Past Medical History:  Diagnosis Date  . Anemia    history of  . Anxiety   . Arthritis   . Asthma   . Breast cancer ([place])   . Cancer ([place])    skin cancers, one melanoma   . Complication of anesthesia    patient woke up during colonoscopy  . Concussion    8 yrs. ago due to being thrown from a horse  . Dislocated elbow 10/12/2016   left  . Dysrhythmia 12/2016   A-fib  . Family history of breast cancer   . GERD (gastroesophageal reflux disease)   .  History of kidney stones   . History of radiation therapy 03/23/17-05/04/17   right chest wall 50.4 Gy in 28 fractions, right axilla 45 Gy in 25 fractions  . Hypertension   . 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 Keratosis, actinic   . Melanoma ([place])   . Persistent atrial fibrillation ([place]) 12/16/2016   CHA2DS2VASC score 3  . Pneumonia    hx of     Past Surgical History:  Procedure Laterality Date  . brachial skin removal due to deforimity Bilateral   . BREAST ENHANCEMENT SURGERY Left 11/14/2018   Procedure: LEFT BREAST AUGMENTATION;  Surgeon: [name], MD;  Location: [facility];  Service: Plastics;  Laterality: Left;  . BREAST RECONSTRUCTION Right 06/26/2018  . BREAST RECONSTRUCTION WITH PLACEMENT OF TISSUE EXPANDER AND ALLODERM Right 06/26/2018    Procedure: RIGHT BREAST RECONSTRUCTION WITH PLACEMENT OF TISSUE EXPANDER;  Surgeon: [name], MD;  Location: [facility];  Service: Plastics;  Laterality: Right;  . BREAST RECONSTRUCTION WITH PLACEMENT OF TISSUE EXPANDER AND FLEX HD (ACELLULAR HYDRATED DERMIS) Right 01/28/2017    RIGHT BREAST RECONSTRUCTION WITH PLACEMENT OF TISSUE EXPANDER AND ALLODERM;  Surgeon: [name], MD;  Location: [facility];  Service: Plastics;  Laterality: Right;  . BUNIONECTOMY     left   . CATARACT EXTRACTION     bilaterla cataract surgery   . COLONOSCOPY W/ POLYPECTOMY    . KNEE ARTHROSCOPY     left   . LATISSIMUS FLAP TO BREAST Right 06/26/2018   Procedure: LATISSIMUS FLAP TO RIGHT CHEST;  Surgeon: [name], MD;  Location: [facility];  Service: Plastics;  Laterality: Right;  . MASTECTOMY Right   . MASTECTOMY W/ SENTINEL NODE BIOPSY Right 01/28/2017   Procedure: RIGHT TOTAL MASTECTOMY WITH SENTINEL LYMPH NODE BIOPSY;  Surgeon: Excell Seltzer, MD;  Location: [facility];  Service: General;  Laterality: Right;  . MASTOPEXY Left 11/14/2018   Procedure: LEFT BREAST MASTOPEXY;  Surgeon: [name], MD;  Location: [facility];  Service: Plastics;  Laterality: Left;  Marland Kitchen MELANOMA EXCISION Left   . polyp removed from uterus     . REMOVAL OF TISSUE EXPANDER AND PLACEMENT OF IMPLANT Right 11/14/2018   Procedure: REMOVAL OF RIGHT TISSUE EXPANDER AND PLACEMENT OF SALINE IMPLANT;  Surgeon: [name], MD;  Location: [facility];  Service: Plastics;  Laterality: Right;  . right wrist pinning     . skin cancers removed    . TISSUE EXPANDER PLACEMENT Right 02/27/2018   Procedure: REMOVAL OF TISSUE EXPANDER;  Surgeon: [name], MD;  Location: [facility];  Service: Plastics;  Laterality: Right;  . TOTAL KNEE ARTHROPLASTY Right 01/29/2013   Procedure: RIGHT TOTAL KNEE ARTHROPLASTY;  Surgeon: Gearlean Alf, MD;  [facility];  Service: Orthopedics;  Laterality: Right;  . TOTAL  KNEE ARTHROPLASTY Left 08/29/2017   Procedure: LEFT TOTAL KNEE ARTHROPLASTY;  Surgeon: Gaynelle Arabian, MD;  [facility];  Service: Orthopedics;  Laterality: Left;    Social History   Socioeconomic History  . Marital status: Married    Spouse name: Not on file  . Number of children: Not on file  . Years of education: Not on file  . Highest education level: Not on file  Occupational History  . Not on file  Tobacco Use  . Smoking status: Never Smoker  . Smokeless tobacco: Never Used  Vaping Use  . Vaping Use: Never used  Substance and Sexual Activity  . Alcohol use: Yes  Comment: occ  . Drug use: No  . Sexual activity: Not on file  Other Topics Concern  . Not on file  Social History Narrative  . Not on file   Social Determinants of Health   Financial Resource Strain:   . Difficulty of Paying Living Expenses: Not on file  Food Insecurity:   . Worried About Charity fundraiser in the Last Year: Not on file  . Ran Out of Food in the Last Year: Not on file  Transportation Needs:   . Lack of Transportation (Medical): Not on file  . Lack of Transportation (Non-Medical): Not on file  Physical Activity:   . Days of Exercise per Week: Not on file  . Minutes of Exercise per Session: Not on file  Stress:   . Feeling of Stress : Not on file  Social Connections:   . Frequency of Communication with Friends and Family: Not on file  . Frequency of Social Gatherings with Friends and Family: Not on file  . Attends Religious Services: Not on file  . Active Member of Clubs or Organizations: Not on file  . Attends Archivist Meetings: Not on file  . Marital Status: Not on file  Intimate Partner Violence:   . Fear of Current or Ex-Partner: Not on file  . Emotionally Abused: Not on file  . Physically Abused: Not on file  . Sexually Abused: Not on file    Family History  Problem Relation Age of Onset  . COPD Father   . Breast cancer Other        dx in her 20s-30s     ROS: no fevers or chills, productive cough, hemoptysis, dysphasia, odynophagia, melena, hematochezia, dysuria, hematuria, rash, seizure activity, orthopnea, PND, claudication. Remaining systems are negative.  Physical Exam: Well-developed well-nourished in no acute distress.  Skin is warm and dry.  HEENT is normal.  Neck is supple.  Chest is clear to auscultation with normal expansion.  Cardiovascular exam is irregular Abdominal exam nontender or distended. No masses palpated. Extremities show trace edema. neuro grossly intact  ECG-atrial fibrillation at a rate of 65, nonspecific ST changes.  Personally reviewed.   A/P  1 permanent atrial fibrillation-continue rate control with present dose of beta-blocker.  Continue Xarelto.  Note patient had hemoglobin checked September 20 that was 13.1.  2 hypertension-patient's blood pressure is controlled.  Continue present medical regimen.  3 dyspnea/lower extremity edema-patient with worsening lower extremity edema and dyspnea on exertion during recent trip in [place].  She states she had much worse edema while there which has improved with supplementation by a "natural" physician.  We will plan to repeat echocardiogram to reassess LV function.  Discontinue Maxide.  We will treat with Lasix 40 mg daily and potassium 20 mill equivalents daily.  Check potassium and renal function in 1 week.  We will also check a BMP at that time.  She will return to see me in 4 to 6 weeks to make sure that she is improving.  She needs to continue fluid restriction and low-sodium diet.  Note I considered anemia contributing to her symptoms given recent hematochezia.  However her hemoglobin was checked and was 13.1.  She is scheduled to see gastroenterology soon by her report.  I also considered pulmonary embolus but I think this would be exceptionally unlikely given that she is on Xarelto.  Kirk Ruths, MD

## 2020-04-08 ENCOUNTER — Ambulatory Visit (INDEPENDENT_AMBULATORY_CARE_PROVIDER_SITE_OTHER): Payer: Medicare Other | Admitting: Cardiology

## 2020-04-08 ENCOUNTER — Encounter: Payer: Self-pay | Admitting: Cardiology

## 2020-04-08 ENCOUNTER — Other Ambulatory Visit: Payer: Self-pay

## 2020-04-08 VITALS — BP 122/76 | HR 79 | Temp 96.4°F | Ht 60.0 in | Wt 148.8 lb

## 2020-04-08 DIAGNOSIS — I4819 Other persistent atrial fibrillation: Secondary | ICD-10-CM

## 2020-04-08 DIAGNOSIS — I10 Essential (primary) hypertension: Secondary | ICD-10-CM

## 2020-04-08 DIAGNOSIS — R0602 Shortness of breath: Secondary | ICD-10-CM | POA: Diagnosis not present

## 2020-04-08 DIAGNOSIS — R600 Localized edema: Secondary | ICD-10-CM | POA: Diagnosis not present

## 2020-04-08 MED ORDER — FUROSEMIDE 40 MG PO TABS: 40.0000 mg | ORAL_TABLET | Freq: Every day | ORAL | 3 refills | Status: DC

## 2020-04-08 MED ORDER — POTASSIUM CHLORIDE CRYS ER 20 MEQ PO TBCR: 20.0000 meq | EXTENDED_RELEASE_TABLET | Freq: Every day | ORAL | 3 refills | Status: DC

## 2020-04-08 NOTE — Patient Instructions (Signed)
Medication Instructions:   STOP TRIAM-HCTZ OR MAXZIDE  START FUROSEMIDE 40 GM ONCE DAILY  STOP CURRENT POTASSIUM  START POTASSIUM CHLORIDE 20 MEQ ONCE DAILY  *If you need a refill on your cardiac medications before your next appointment, please call your pharmacy*   Lab Work:  Your physician recommends that you return for lab work in:[place]  If you have labs (blood work) drawn today and your tests are completely normal, you will receive your results only by: Marland Kitchen MyChart Message (if you have MyChart) OR . A paper copy in the mail If you have any lab test that is abnormal or we need to change your treatment, we will call you to review the results.   Testing/Procedures:  Your physician has requested that you have an echocardiogram. Echocardiography is a painless test that uses sound waves to create images of your heart. It provides your doctor with information about the size and shape of your heart and how well your heart's chambers and valves are working. This procedure takes approximately one hour. There are no restrictions for this procedure.[place]     Follow-Up: At [facility], you and your health needs are our priority.  As part of our continuing mission to provide you with exceptional heart care, we have created designated Provider Care Teams.  These Care Teams include your primary Cardiologist (physician) and Advanced Practice Providers (APPs -  Physician Assistants and Nurse Practitioners) who all work together to provide you with the care you need, when you need it.  We recommend signing up for the patient portal called "MyChart".  Sign up information is provided on this After Visit Summary.  MyChart is used to connect with patients for Virtual Visits (Telemedicine).  Patients are able to view lab/test results, encounter notes, upcoming appointments, etc.  Non-urgent messages can be sent to your provider as well.   To learn more about what you can do with  MyChart, go to [email or web address].    Your next appointment:   4-6 week(s)  The format for your next appointment:   In Person  Provider:   Kirk Ruths, MD

## 2020-04-16 ENCOUNTER — Other Ambulatory Visit: Payer: Self-pay | Admitting: Cardiology

## 2020-04-18 ENCOUNTER — Telehealth: Payer: Self-pay | Admitting: *Deleted

## 2020-04-18 DIAGNOSIS — E876 Hypokalemia: Secondary | ICD-10-CM

## 2020-04-18 LAB — BASIC METABOLIC PANEL
BUN/Creatinine Ratio: 18 (ref 12–28)
BUN: 11 mg/dL (ref 8–27)
CO2: 36 mmol/L — ABNORMAL HIGH (ref 20–29)
Calcium: 9.8 mg/dL (ref 8.7–10.3)
Chloride: 81 mmol/L — ABNORMAL LOW (ref 96–106)
Creatinine, Ser: 0.62 mg/dL (ref 0.57–1.00)
GFR calc Af Amer: 101 mL/min/{1.73_m2} (ref >59)
GFR calc non Af Amer: 87 mL/min/{1.73_m2} (ref >59)
Glucose: 102 mg/dL — ABNORMAL HIGH (ref 65–99)
Potassium: 3.2 mmol/L — ABNORMAL LOW (ref 3.5–5.2)
Sodium: 135 mmol/L (ref 134–144)

## 2020-04-18 LAB — PRO B NATRIURETIC PEPTIDE: NT-Pro BNP: 433 pg/mL (ref 0–738)

## 2020-04-18 MED ORDER — FUROSEMIDE 40 MG PO TABS: 20.0000 mg | ORAL_TABLET | Freq: Every day | ORAL | 3 refills | Status: DC

## 2020-04-18 NOTE — Telephone Encounter (Addendum)
-----   Message from Lelon Perla, MD sent at 04/18/2020  7:18 AM EST ----- Change lasix to 20 mg daily; bmet one week [place] with pt, Aware of dr Jacalyn Lefevre recommendations. Lab orders mailed to the pt

## 2020-04-24 NOTE — Progress Notes (Signed)
HPI: FUatrial fibrillation. She has been managed with rate control and anticoagulation.  At previous office visit patient had developed worsening lower extremity edema and dyspnea while on a trip to [place].  We discontinued Maxide and treated with Lasix and potassium. ProBNP 433.  Echocardiogram November 2021 showed normal LV function, mild biatrial enlargement, trace aortic insufficiency.  Since last seen,she denies dyspnea, chest pain, palpitations or syncope.  Her pedal edema has improved.  Current Outpatient Medications  Medication Sig Dispense Refill  . albuterol (PROVENTIL HFA;VENTOLIN HFA) 108 (90 Base) MCG/ACT inhaler Inhale 2 puffs into the lungs every 6 (six) hours as needed for wheezing or shortness of breath. For wheezing/shortness of breath    . ALPRAZolam (XANAX) 0.5 MG tablet Take 0.5 mg by mouth daily as needed for anxiety.     Marland Kitchen amLODipine (NORVASC) 5 MG tablet TAKE 1 TABLET BY MOUTH EVERY DAY 90 tablet 1  . BREO ELLIPTA 100-25 MCG/INH AEPB Inhale 1 puff into the lungs daily.    Marland Kitchen desonide (DESOWEN) 0.05 % cream Apply 1 application topically 2 (two) times daily as needed (for skin irritation.).    Marland Kitchen diclofenac sodium (VOLTAREN) 1 % GEL Apply 2 g topically daily as needed (pain).     Marland Kitchen EPINEPHrine (EPIPEN 2-PAK) 0.3 mg/0.3 mL IJ SOAJ injection Inject 0.3 mg into the muscle once.    . famotidine (PEPCID) 20 MG tablet Take 1 tablet by mouth as directed.    . furosemide (LASIX) 40 MG tablet Take 0.5 tablets (20 mg total) by mouth daily. 90 tablet 3  . letrozole (FEMARA) 2.5 MG tablet Take 1 tablet (2.5 mg total) by mouth daily. 90 tablet 4  . levocetirizine (XYZAL) 5 MG tablet Take 5 mg by mouth at bedtime.     Vladimir Faster Glycol-Propyl Glycol (SYSTANE OP) Place 1 drop into both eyes daily.    . potassium chloride SA (KLOR-CON) 20 MEQ tablet Take 1 tablet (20 mEq total) by mouth daily. 90 tablet 3  . propranolol ER (INDERAL LA) 80 MG 24 hr capsule Take 80 mg by mouth daily.     Marland Kitchen venlafaxine XR (EFFEXOR-XR) 75 MG 24 hr capsule TAKE 1 CAPSULE (75 MG TOTAL) BY MOUTH DAILY WITH BREAKFAST. 90 capsule 2  . XARELTO 20 MG TABS tablet TAKE 1 TABLET BY MOUTH EVERY DAY 90 tablet 1   No current facility-administered medications for this visit.     Past Medical History:  Diagnosis Date  . Anemia    history of  . Anxiety   . Arthritis   . Asthma   . Breast cancer ([place])   . Cancer ([place])    skin cancers, one melanoma   . Complication of anesthesia    patient woke up during colonoscopy  . Concussion    8 yrs. ago due to being thrown from a horse  . Dislocated elbow 10/12/2016   left  . Dysrhythmia 12/2016   A-fib  . Family history of breast cancer   . GERD (gastroesophageal reflux disease)   . History of kidney stones   . History of radiation therapy 03/23/17-05/04/17   right chest wall 50.4 Gy in 28 fractions, right axilla 45 Gy in 25 fractions  . Hypertension   . 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 Keratosis, actinic   . Melanoma ([place])   . Persistent atrial fibrillation ([place]) 12/16/2016   CHA2DS2VASC score 3  . Pneumonia    hx of     Past  Surgical History:  Procedure Laterality Date  . brachial skin removal due to deforimity Bilateral   . BREAST ENHANCEMENT SURGERY Left 11/14/2018   Procedure: LEFT BREAST AUGMENTATION;  Surgeon: [name], MD;  Location: [facility];  Service: Plastics;  Laterality: Left;  . BREAST RECONSTRUCTION Right 06/26/2018  . BREAST RECONSTRUCTION WITH PLACEMENT OF TISSUE EXPANDER AND ALLODERM Right 06/26/2018   Procedure: RIGHT BREAST RECONSTRUCTION WITH PLACEMENT OF TISSUE EXPANDER;  Surgeon: [name], MD;  Location: [facility];  Service: Plastics;  Laterality: Right;  . BREAST RECONSTRUCTION WITH PLACEMENT OF TISSUE EXPANDER AND FLEX HD (ACELLULAR HYDRATED DERMIS) Right 01/28/2017    RIGHT BREAST RECONSTRUCTION WITH PLACEMENT OF TISSUE EXPANDER AND ALLODERM;  Surgeon: [name], MD;   Location: [facility];  Service: Plastics;  Laterality: Right;  . BUNIONECTOMY     left   . CATARACT EXTRACTION     bilaterla cataract surgery   . COLONOSCOPY W/ POLYPECTOMY    . KNEE ARTHROSCOPY     left   . LATISSIMUS FLAP TO BREAST Right 06/26/2018   Procedure: LATISSIMUS FLAP TO RIGHT CHEST;  Surgeon: [name], MD;  Location: [facility];  Service: Plastics;  Laterality: Right;  . MASTECTOMY Right   . MASTECTOMY W/ SENTINEL NODE BIOPSY Right 01/28/2017   Procedure: RIGHT TOTAL MASTECTOMY WITH SENTINEL LYMPH NODE BIOPSY;  Surgeon: Excell Seltzer, MD;  Location: [facility];  Service: General;  Laterality: Right;  . MASTOPEXY Left 11/14/2018   Procedure: LEFT BREAST MASTOPEXY;  Surgeon: [name], MD;  Location: [facility];  Service: Plastics;  Laterality: Left;  Marland Kitchen MELANOMA EXCISION Left   . polyp removed from uterus     . REMOVAL OF TISSUE EXPANDER AND PLACEMENT OF IMPLANT Right 11/14/2018   Procedure: REMOVAL OF RIGHT TISSUE EXPANDER AND PLACEMENT OF SALINE IMPLANT;  Surgeon: [name], MD;  Location: [facility];  Service: Plastics;  Laterality: Right;  . right wrist pinning     . skin cancers removed    . TISSUE EXPANDER PLACEMENT Right 02/27/2018   Procedure: REMOVAL OF TISSUE EXPANDER;  Surgeon: [name], MD;  Location: [facility];  Service: Plastics;  Laterality: Right;  . TOTAL KNEE ARTHROPLASTY Right 01/29/2013   Procedure: RIGHT TOTAL KNEE ARTHROPLASTY;  Surgeon: Gearlean Alf, MD;  [facility];  Service: Orthopedics;  Laterality: Right;  . TOTAL KNEE ARTHROPLASTY Left 08/29/2017   Procedure: LEFT TOTAL KNEE ARTHROPLASTY;  Surgeon: Gaynelle Arabian, MD;  [facility];  Service: Orthopedics;  Laterality: Left;    Social History   Socioeconomic History  . Marital status: Married    Spouse name: Not on file  . Number of children: Not on file  . Years of education: Not on file  . Highest education level: Not on file    Occupational History  . Not on file  Tobacco Use  . Smoking status: Never Smoker  . Smokeless tobacco: Never Used  Vaping Use  . Vaping Use: Never used  Substance and Sexual Activity  . Alcohol use: Yes    Comment: occ  . Drug use: No  . Sexual activity: Not on file  Other Topics Concern  . Not on file  Social History Narrative  . Not on file   Social Determinants of Health   Financial Resource Strain:   . Difficulty of Paying Living Expenses: Not on file  Food Insecurity:   . Worried About Charity fundraiser in the Last Year: Not on file  .  Ran Out of Food in the Last Year: Not on file  Transportation Needs:   . Lack of Transportation (Medical): Not on file  . Lack of Transportation (Non-Medical): Not on file  Physical Activity:   . Days of Exercise per Week: Not on file  . Minutes of Exercise per Session: Not on file  Stress:   . Feeling of Stress : Not on file  Social Connections:   . Frequency of Communication with Friends and Family: Not on file  . Frequency of Social Gatherings with Friends and Family: Not on file  . Attends Religious Services: Not on file  . Active Member of Clubs or Organizations: Not on file  . Attends Archivist Meetings: Not on file  . Marital Status: Not on file  Intimate Partner Violence:   . Fear of Current or Ex-Partner: Not on file  . Emotionally Abused: Not on file  . Physically Abused: Not on file  . Sexually Abused: Not on file    Family History  Problem Relation Age of Onset  . COPD Father   . Breast cancer Other        dx in her 20s-30s    ROS: no fevers or chills, productive cough, hemoptysis, dysphasia, odynophagia, melena, hematochezia, dysuria, hematuria, rash, seizure activity, orthopnea, PND, pedal edema, claudication. Remaining systems are negative.  Physical Exam: Well-developed well-nourished in no acute distress.  Skin is warm and dry.  HEENT is normal.  Neck is supple.  Chest is clear to  auscultation with normal expansion.  Cardiovascular exam is regular rate and rhythm.  Abdominal exam nontender or distended. No masses palpated. Extremities show trace edema. neuro grossly intact  A/P  1 dyspnea/lower extremity edema-symptoms have improved with Lasix.  We will continue 20 mg daily with an additional 20 as needed.  Continue potassium at present dose.  We discussed fluid restriction and low-sodium diet.  2 permanent atrial fibrillation-plan to continue beta-blocker for rate control.  Continue Xarelto.  3 hypertension-blood pressure controlled.  Continue present medications and follow.  Kirk Ruths, MD

## 2020-04-25 ENCOUNTER — Encounter: Payer: Self-pay | Admitting: *Deleted

## 2020-04-25 LAB — BASIC METABOLIC PANEL
BUN/Creatinine Ratio: 12 (ref 12–28)
BUN: 9 mg/dL (ref 8–27)
CO2: 27 mmol/L (ref 20–29)
Calcium: 10.1 mg/dL (ref 8.7–10.3)
Chloride: 90 mmol/L — ABNORMAL LOW (ref 96–106)
Creatinine, Ser: 0.74 mg/dL (ref 0.57–1.00)
GFR calc Af Amer: 90 mL/min/{1.73_m2} (ref >59)
GFR calc non Af Amer: 78 mL/min/{1.73_m2} (ref >59)
Glucose: 96 mg/dL (ref 65–99)
Potassium: 3.7 mmol/L (ref 3.5–5.2)
Sodium: 140 mmol/L (ref 134–144)

## 2020-05-05 ENCOUNTER — Other Ambulatory Visit: Payer: Self-pay

## 2020-05-05 ENCOUNTER — Ambulatory Visit (HOSPITAL_COMMUNITY): Payer: Medicare Other | Attending: Cardiology

## 2020-05-05 DIAGNOSIS — R0602 Shortness of breath: Secondary | ICD-10-CM | POA: Insufficient documentation

## 2020-05-05 LAB — ECHOCARDIOGRAM COMPLETE
P 1/2 time: 429 msec
S' Lateral: 2.7 cm

## 2020-05-06 ENCOUNTER — Encounter: Payer: Self-pay | Admitting: Cardiology

## 2020-05-06 ENCOUNTER — Ambulatory Visit (INDEPENDENT_AMBULATORY_CARE_PROVIDER_SITE_OTHER): Payer: Medicare Other | Admitting: Cardiology

## 2020-05-06 VITALS — BP 122/78 | HR 80 | Ht 60.0 in | Wt 144.8 lb

## 2020-05-06 DIAGNOSIS — I4821 Permanent atrial fibrillation: Secondary | ICD-10-CM

## 2020-05-06 DIAGNOSIS — I10 Essential (primary) hypertension: Secondary | ICD-10-CM | POA: Diagnosis not present

## 2020-05-06 NOTE — Patient Instructions (Signed)

## 2020-05-13 NOTE — Telephone Encounter (Signed)
   Primary Cardiologist: No primary care provider on file.  Chart reviewed and patient contacted today by phone as part of pre-operative protocol coverage. Given past medical history and time since last visit, based on ACC/AHA guidelines, [name] would be at acceptable risk for the planned procedure without further cardiovascular testing.    OK to hold Xarelto 2 days pre op, resume as soon as safe post op.   The patient was advised that if she develops new symptoms prior to surgery to contact our office to arrange for a follow-up visit, and she verbalized understanding.  I will route this recommendation to the requesting party via Epic fax function and remove from pre-op pool.  Please call with questions.  Kerin Ransom, PA-C 05/13/2020, 10:14 AM

## 2020-05-13 NOTE — Telephone Encounter (Signed)
Left message to call back on her home and cell #  Kerin Ransom PA-C 05/13/2020 9:33 AM

## 2020-05-13 NOTE — Telephone Encounter (Signed)
Amy is calling to check on the status of the patient's clearance due to her being scheduled for next Wednesday and not hearing anything back yet. Please advise.

## 2020-05-13 NOTE — Telephone Encounter (Signed)
[name] is returning [name]'s phone call. Please advise.

## 2020-06-07 ENCOUNTER — Other Ambulatory Visit: Payer: Self-pay | Admitting: Cardiology

## 2020-08-05 ENCOUNTER — Other Ambulatory Visit: Payer: Self-pay | Admitting: Orthopedic Surgery

## 2020-08-05 ENCOUNTER — Ambulatory Visit
Admission: RE | Admit: 2020-08-05 | Discharge: 2020-08-05 | Disposition: A | Discharge status: Home / Self Care | Payer: Medicare Other | Source: Ambulatory Visit | Attending: Orthopedic Surgery | Admitting: Orthopedic Surgery

## 2020-08-05 ENCOUNTER — Other Ambulatory Visit: Payer: Self-pay

## 2020-08-05 DIAGNOSIS — S72431D Displaced fracture of medial condyle of right femur, subsequent encounter for closed fracture with routine healing: Secondary | ICD-10-CM

## 2020-08-07 ENCOUNTER — Other Ambulatory Visit: Payer: Self-pay | Admitting: Family Medicine

## 2020-08-07 DIAGNOSIS — S0093XA Contusion of unspecified part of head, initial encounter: Secondary | ICD-10-CM

## 2020-08-15 ENCOUNTER — Other Ambulatory Visit: Payer: Self-pay

## 2020-08-15 ENCOUNTER — Telehealth: Payer: Self-pay | Admitting: Cardiology

## 2020-08-15 DIAGNOSIS — E876 Hypokalemia: Secondary | ICD-10-CM

## 2020-08-15 MED ORDER — FUROSEMIDE 20 MG PO TABS: 20.0000 mg | ORAL_TABLET | Freq: Every day | ORAL | 1 refills | Status: DC

## 2020-08-15 NOTE — Telephone Encounter (Signed)
Pt c/o medication issue:  1. Name of Medication: furosemide (LASIX) 40 MG tablet(Expired)  2. How are you currently taking this medication (dosage and times per day)? Half a tablet daily  3. Are you having a reaction (difficulty breathing--STAT)? no  4. What is your medication issue? Patient requesting her prescription be changed to 20 mg tablets so she does not half to cut them in half.

## 2020-08-15 NOTE — Telephone Encounter (Signed)
Called patient, sent in RX to pharmacy based on last note:  dyspnea/lower extremity edema-symptoms have improved with Lasix.  We will continue 20 mg daily with an additional 20 as needed.  Continue potassium at present dose.  We discussed fluid restriction and low-sodium diet.  Lasix 20 mg sent to preferred pharmacy.  Will route to nurse as Juluis Rainier of med change.

## 2020-08-18 ENCOUNTER — Ambulatory Visit
Admission: RE | Admit: 2020-08-18 | Discharge: 2020-08-18 | Disposition: A | Discharge status: Home / Self Care | Payer: Medicare Other | Source: Ambulatory Visit | Attending: Family Medicine | Admitting: Family Medicine

## 2020-08-18 DIAGNOSIS — S0093XA Contusion of unspecified part of head, initial encounter: Secondary | ICD-10-CM

## 2020-09-26 ENCOUNTER — Other Ambulatory Visit: Payer: Self-pay | Admitting: Cardiology

## 2020-09-26 NOTE — Telephone Encounter (Signed)
Prescription refill request for Xarelto received.  Indication:atrial fib Last office visit:11/21 [place] Weight:65.7 kg Age:80 Scr:0.74  11/21 CrCl:66.03 ml/min  Prescription refilled

## 2020-10-09 ENCOUNTER — Other Ambulatory Visit: Payer: Self-pay | Admitting: Oncology

## 2020-10-28 NOTE — Progress Notes (Signed)
HPI: FUatrial fibrillation. She has been managed with rate control and anticoagulation.  At previous office visit patient had developed worsening lower extremity edema and dyspnea while on a trip to [place].  We discontinued Maxide and treated with Lasix and potassium. ProBNP 433.  Echocardiogram November 2021 showed normal LV function, mild biatrial enlargement, trace aortic insufficiency.  Since last seen,she denies dyspnea, chest pain, palpitations or syncope.  She continues to have pedal edema.  Current Outpatient Medications  Medication Sig Dispense Refill  . ALPRAZolam (XANAX) 0.5 MG tablet Take 0.5 mg by mouth daily as needed for anxiety.     Marland Kitchen amLODipine (NORVASC) 5 MG tablet TAKE 1 TABLET BY MOUTH EVERY DAY 90 tablet 1  . BREO ELLIPTA 100-25 MCG/INH AEPB Inhale 1 puff into the lungs daily.    Marland Kitchen desonide (DESOWEN) 0.05 % cream Apply 1 application topically 2 (two) times daily as needed (for skin irritation.).    Marland Kitchen diclofenac sodium (VOLTAREN) 1 % GEL Apply 2 g topically daily as needed (pain).     . famotidine (PEPCID) 20 MG tablet Take 1 tablet by mouth as directed.    . fluticasone (FLONASE) 50 MCG/ACT nasal spray fluticasone propionate 50 mcg/actuation nasal spray,suspension  SPRAY 2 SPRAYS INTO EACH NOSTRIL EVERY DAY FOR 30 DAYS    . furosemide (LASIX) 20 MG tablet Take 1 tablet (20 mg total) by mouth daily. 90 tablet 1  . letrozole (FEMARA) 2.5 MG tablet Take 1 tablet (2.5 mg total) by mouth daily. 90 tablet 4  . Polyethyl Glycol-Propyl Glycol (SYSTANE OP) Place 1 drop into both eyes daily.    . potassium chloride SA (KLOR-CON) 20 MEQ tablet Take 1 tablet (20 mEq total) by mouth daily. 90 tablet 3  . propranolol ER (INDERAL LA) 80 MG 24 hr capsule Take 80 mg by mouth daily.    Marland Kitchen venlafaxine XR (EFFEXOR-XR) 75 MG 24 hr capsule TAKE 1 CAPSULE (75 MG TOTAL) BY MOUTH DAILY WITH BREAKFAST. 90 capsule 2  . XARELTO 20 MG TABS tablet TAKE 1 TABLET BY MOUTH DAILY WITH SUPPER. 90  tablet 1  . albuterol (PROVENTIL HFA;VENTOLIN HFA) 108 (90 Base) MCG/ACT inhaler Inhale 2 puffs into the lungs every 6 (six) hours as needed for wheezing or shortness of breath. For wheezing/shortness of breath (Patient not taking: Reported on 11/11/2020)    . EPINEPHrine (EPIPEN 2-PAK) 0.3 mg/0.3 mL IJ SOAJ injection Inject 0.3 mg into the muscle once.    Marland Kitchen levocetirizine (XYZAL) 5 MG tablet Take 5 mg by mouth at bedtime.  (Patient not taking: Reported on 11/11/2020)     No current facility-administered medications for this visit.     Past Medical History:  Diagnosis Date  . Anemia    history of  . Anxiety   . Arthritis   . Asthma   . Breast cancer ([place])   . Cancer ([place])    skin cancers, one melanoma   . Complication of anesthesia    patient woke up during colonoscopy  . Concussion    8 yrs. ago due to being thrown from a horse  . Dislocated elbow 10/12/2016   left  . Dysrhythmia 12/2016   A-fib  . Family history of breast cancer   . GERD (gastroesophageal reflux disease)   . History of kidney stones   . History of radiation therapy 03/23/17-05/04/17   right chest wall 50.4 Gy in 28 fractions, right axilla 45 Gy in 25 fractions  . Hypertension   .  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 Keratosis, actinic   . Melanoma ([place])   . Persistent atrial fibrillation ([place]) 12/16/2016   CHA2DS2VASC score 3  . Pneumonia    hx of     Past Surgical History:  Procedure Laterality Date  . brachial skin removal due to deforimity Bilateral   . BREAST ENHANCEMENT SURGERY Left 11/14/2018   Procedure: LEFT BREAST AUGMENTATION;  Surgeon: [name], MD;  Location: [facility];  Service: Plastics;  Laterality: Left;  . BREAST RECONSTRUCTION Right 06/26/2018  . BREAST RECONSTRUCTION WITH PLACEMENT OF TISSUE EXPANDER AND ALLODERM Right 06/26/2018   Procedure: RIGHT BREAST RECONSTRUCTION WITH PLACEMENT OF TISSUE EXPANDER;  Surgeon: [name], MD;  Location: [facility];   Service: Plastics;  Laterality: Right;  . BREAST RECONSTRUCTION WITH PLACEMENT OF TISSUE EXPANDER AND FLEX HD (ACELLULAR HYDRATED DERMIS) Right 01/28/2017    RIGHT BREAST RECONSTRUCTION WITH PLACEMENT OF TISSUE EXPANDER AND ALLODERM;  Surgeon: [name], MD;  Location: [facility];  Service: Plastics;  Laterality: Right;  . BUNIONECTOMY     left   . CATARACT EXTRACTION     bil[place]la cataract surgery   . COLONOSCOPY W/ POLYPECTOMY    . KNEE ARTHROSCOPY     left   . LATISSIMUS FLAP TO BREAST Right 06/26/2018   Procedure: LATISSIMUS FLAP TO RIGHT CHEST;  Surgeon: [name], MD;  Location: [facility];  Service: Plastics;  Laterality: Right;  . MASTECTOMY Right   . MASTECTOMY W/ SENTINEL NODE BIOPSY Right 01/28/2017   Procedure: RIGHT TOTAL MASTECTOMY WITH SENTINEL LYMPH NODE BIOPSY;  Surgeon: Excell Seltzer, MD;  Location: [facility];  Service: General;  Laterality: Right;  . MASTOPEXY Left 11/14/2018   Procedure: LEFT BREAST MASTOPEXY;  Surgeon: [name], MD;  Location: [facility];  Service: Plastics;  Laterality: Left;  Marland Kitchen MELANOMA EXCISION Left   . polyp removed from uterus     . REMOVAL OF TISSUE EXPANDER AND PLACEMENT OF IMPLANT Right 11/14/2018   Procedure: REMOVAL OF RIGHT TISSUE EXPANDER AND PLACEMENT OF SALINE IMPLANT;  Surgeon: [name], MD;  Location: [facility];  Service: Plastics;  Laterality: Right;  . right wrist pinning     . skin cancers removed    . TISSUE EXPANDER PLACEMENT Right 02/27/2018   Procedure: REMOVAL OF TISSUE EXPANDER;  Surgeon: [name], MD;  Location: [facility];  Service: Plastics;  Laterality: Right;  . TOTAL KNEE ARTHROPLASTY Right 01/29/2013   Procedure: RIGHT TOTAL KNEE ARTHROPLASTY;  Surgeon: Gearlean Alf, MD;  Location: [facility];  Service: Orthopedics;  Laterality: Right;  . TOTAL KNEE ARTHROPLASTY Left 08/29/2017   Procedure: LEFT TOTAL KNEE ARTHROPLASTY;  Surgeon: Gaynelle Arabian, MD;  Location: [facility];   Service: Orthopedics;  Laterality: Left;    Social History   Socioeconomic History  . Marital status: Married    Spouse name: Not on file  . Number of children: Not on file  . Years of education: Not on file  . Highest education level: Not on file  Occupational History  . Not on file  Tobacco Use  . Smoking status: Never Smoker  . Smokeless tobacco: Never Used  Vaping Use  . Vaping Use: Never used  Substance and Sexual Activity  . Alcohol use: Yes    Comment: occ  . Drug use: No  . Sexual activity: Not on file  Other Topics Concern  . Not on file  Social History Narrative  . Not on file  Social Determinants of Health   Financial Resource Strain: Not on file  Food Insecurity: Not on file  Transportation Needs: Not on file  Physical Activity: Not on file  Stress: Not on file  Social Connections: Not on file  Intimate Partner Violence: Not on file    Family History  Problem Relation Age of Onset  . COPD Father   . Breast cancer Other        dx in her 20s-30s    ROS: no fevers or chills, productive cough, hemoptysis, dysphasia, odynophagia, melena, hematochezia, dysuria, hematuria, rash, seizure activity, orthopnea, PND, claudication. Remaining systems are negative.  Physical Exam: Well-developed well-nourished in no acute distress.  Skin is warm and dry.  HEENT is normal.  Neck is supple.  Chest is clear to auscultation with normal expansion.  Cardiovascular exam is regular rate and rhythm.  Abdominal exam nontender or distended. No masses palpated. Extremities show 1+ edema. neuro grossly intact  ECG- personally reviewed  A/P  1 permanent atrial fibrillation-continue propranolol and xarelto; check hgb and renal function.  2 hypertension-BP controlled; however she continues with pedal edema.  Amlodipine could certainly be contributing.  Will discontinue.  Instead we will treat with losartan 50 mg daily.  In 1 week check potassium and renal function.   Follow blood pressure and adjust medications as needed.  3 dyspnea/edema-continue diuretic at present dose; check BMET.  Kirk Ruths, MD

## 2020-11-11 ENCOUNTER — Other Ambulatory Visit: Payer: Self-pay

## 2020-11-11 ENCOUNTER — Ambulatory Visit (INDEPENDENT_AMBULATORY_CARE_PROVIDER_SITE_OTHER): Payer: Medicare Other | Admitting: Cardiology

## 2020-11-11 ENCOUNTER — Encounter: Payer: Self-pay | Admitting: Cardiology

## 2020-11-11 VITALS — BP 100/66 | HR 85 | Ht 60.0 in | Wt 144.4 lb

## 2020-11-11 DIAGNOSIS — I4821 Permanent atrial fibrillation: Secondary | ICD-10-CM | POA: Diagnosis not present

## 2020-11-11 DIAGNOSIS — I10 Essential (primary) hypertension: Secondary | ICD-10-CM | POA: Diagnosis not present

## 2020-11-11 DIAGNOSIS — R600 Localized edema: Secondary | ICD-10-CM

## 2020-11-11 MED ORDER — LOSARTAN POTASSIUM 50 MG PO TABS: 50.0000 mg | ORAL_TABLET | Freq: Every day | ORAL | 3 refills | Status: DC

## 2020-11-11 NOTE — Patient Instructions (Signed)
Medication Instructions:   STOP AMLODIPINE  START LOSARTAN 50 MG ONCE DAILY  *If you need a refill on your cardiac medications before your next appointment, please call your pharmacy*   Lab Work:  Your physician recommends that you return for lab work in:ONE WEEK  If you have labs (blood work) drawn today and your tests are completely normal, you will receive your results only by: Marland Kitchen MyChart Message (if you have MyChart) OR . A paper copy in the mail If you have any lab test that is abnormal or we need to change your treatment, we will call you to review the results.   Follow-Up: At [facility], you and your health needs are our priority.  As part of our continuing mission to provide you with exceptional heart care, we have created designated Provider Care Teams.  These Care Teams include your primary Cardiologist (physician) and Advanced Practice Providers (APPs -  Physician Assistants and Nurse Practitioners) who all work together to provide you with the care you need, when you need it.  We recommend signing up for the patient portal called "MyChart".  Sign up information is provided on this After Visit Summary.  MyChart is used to connect with patients for Virtual Visits (Telemedicine).  Patients are able to view lab/test results, encounter notes, upcoming appointments, etc.  Non-urgent messages can be sent to your provider as well.   To learn more about what you can do with MyChart, go to [email or web address].    Your next appointment:   6 month(s)  The format for your next appointment:   In Person  Provider:   Kirk Ruths, MD

## 2020-11-18 ENCOUNTER — Telehealth: Payer: Self-pay | Admitting: Cardiology

## 2020-11-18 DIAGNOSIS — I10 Essential (primary) hypertension: Secondary | ICD-10-CM

## 2020-11-18 DIAGNOSIS — Z79899 Other long term (current) drug therapy: Secondary | ICD-10-CM

## 2020-11-18 NOTE — Telephone Encounter (Signed)
This RN received call back from patient. Patient reports she took her blood pressure this morning and it was 160/94. She reports her morning blood pressures have been higher than usual and was concerned because Dr. [name] recently changed her medication (11/11/2020). Pt reports this BP reading was before taking her medication, reports she has now taken her medication. This RN asked patient to take BP once more after taking medication, pt's BP now 119/73, pulse 93.   Pt reports she has had occasional headaches since beginning the medication, as well as blurry vision. Pt denies weakness, denies facial droop, denies chest pain, denies dizziness, denies shortness of breath. Pt told this RN she has experienced blurry vision before when anxious. This RN told patient her concerns would be forwarded to Dr. [name] and his primary nurse. This RN advised pt to call 911 or have someone take her to the emergency room should she develop symptoms such as shortness of breath, weakness, facial droop, chest pain, dizziness, or other troubling symptoms. Pt verbalized understanding.

## 2020-11-18 NOTE — Telephone Encounter (Signed)
    Pt c/o BP issue: STAT if pt c/o blurred vision, one-sided weakness or slurred speech  1. What are your last 5 BP readings?  06/10 128/77 am 06/11 138/81 Am 100/63 pm 06/12 133/90 am  109/66 pm 06/13  151/84 102/47 pm 06/14 160/94 am  2. Are you having any other symptoms (ex. Dizziness, headache, blurred vision, passed out)? Headache, a little dizziness  3. What is your BP issue? Pt said since Dr. [name] changed one of her medications to Losartan and she feels like it is not working well to manage her BP

## 2020-11-18 NOTE — Telephone Encounter (Signed)
This RN attempted to call patient on both available phone numbers, for both calls went to voicemail. This RN left nonspecific voicemails on both numbers asking patient to return office phone calls.

## 2020-11-19 MED ORDER — LOSARTAN POTASSIUM 50 MG PO TABS: 50.0000 mg | ORAL_TABLET | Freq: Two times a day (BID) | ORAL | 3 refills | Status: DC

## 2020-11-19 NOTE — Telephone Encounter (Signed)
Left message for pt to call.

## 2020-11-19 NOTE — Telephone Encounter (Signed)
Pt is returning call from earlier today to [place]. Please advise

## 2020-11-19 NOTE — Telephone Encounter (Signed)
Change losartan to 50 mg twice daily.  Follow blood pressure.  Check potassium and renal function in 1 week.  Kirk Ruths      Spoke to patient, patient aware of recommendations and verbalized understanding.   Rx updated and lab order placed

## 2020-11-28 LAB — CBC
Hematocrit: 42.7 % (ref 34.0–46.6)
Hemoglobin: 14.4 g/dL (ref 11.1–15.9)
MCH: 35.6 pg — ABNORMAL HIGH (ref 26.6–33.0)
MCHC: 33.7 g/dL (ref 31.5–35.7)
MCV: 105 fL — ABNORMAL HIGH (ref 79–97)
Platelets: 221 10*3/uL (ref 150–450)
RBC: 4.05 x10E6/uL (ref 3.77–5.28)
RDW: 12.5 % (ref 11.7–15.4)
WBC: 7.6 10*3/uL (ref 3.4–10.8)

## 2020-11-28 LAB — BASIC METABOLIC PANEL
BUN/Creatinine Ratio: 19 (ref 12–28)
BUN: 12 mg/dL (ref 8–27)
CO2: 29 mmol/L (ref 20–29)
Calcium: 9.9 mg/dL (ref 8.7–10.3)
Chloride: 93 mmol/L — ABNORMAL LOW (ref 96–106)
Creatinine, Ser: 0.63 mg/dL (ref 0.57–1.00)
Glucose: 94 mg/dL (ref 65–99)
Potassium: 4.9 mmol/L (ref 3.5–5.2)
Sodium: 136 mmol/L (ref 134–144)
eGFR: 91 mL/min/{1.73_m2} (ref >59)

## 2020-12-01 ENCOUNTER — Encounter: Payer: Self-pay | Admitting: *Deleted

## 2020-12-12 ENCOUNTER — Other Ambulatory Visit: Payer: Self-pay

## 2020-12-12 ENCOUNTER — Ambulatory Visit
Admission: RE | Admit: 2020-12-12 | Discharge: 2020-12-12 | Disposition: A | Discharge status: Home / Self Care | Payer: Medicare Other | Source: Ambulatory Visit | Attending: Oncology | Admitting: Oncology

## 2020-12-12 DIAGNOSIS — C50211 Malignant neoplasm of upper-inner quadrant of right female breast: Secondary | ICD-10-CM

## 2020-12-12 DIAGNOSIS — Z170 Estrogen receptor positive status [ER+]: Secondary | ICD-10-CM

## 2020-12-12 DIAGNOSIS — I4819 Other persistent atrial fibrillation: Secondary | ICD-10-CM

## 2020-12-22 ENCOUNTER — Other Ambulatory Visit: Payer: Self-pay | Admitting: Cardiology

## 2020-12-22 ENCOUNTER — Telehealth: Payer: Self-pay | Admitting: Cardiology

## 2020-12-22 ENCOUNTER — Other Ambulatory Visit: Payer: Self-pay

## 2020-12-22 DIAGNOSIS — E876 Hypokalemia: Secondary | ICD-10-CM

## 2020-12-22 MED ORDER — LOSARTAN POTASSIUM 50 MG PO TABS: 50.0000 mg | ORAL_TABLET | Freq: Two times a day (BID) | ORAL | 3 refills | Status: DC

## 2020-12-22 NOTE — Telephone Encounter (Signed)
*  STAT* If patient is at the pharmacy, call can be transferred to refill team.   1. Which medications need to be refilled? (please list name of each medication and dose if known) losartan (COZAAR) 50 MG tablet [[id number]]  2. Which pharmacy/location (including street and city if local pharmacy) is medication to be sent to?   [id number] - [place], [place] Phone:  [phone]  Fax:  [phone]      3. Do they need a 30 day or 90 day supply? [facility] number [phone]

## 2020-12-28 ENCOUNTER — Emergency Department (HOSPITAL_COMMUNITY): Payer: Medicare Other

## 2020-12-28 ENCOUNTER — Other Ambulatory Visit: Payer: Self-pay

## 2020-12-28 ENCOUNTER — Encounter (HOSPITAL_COMMUNITY): Payer: Self-pay | Admitting: Pharmacy Technician

## 2020-12-28 ENCOUNTER — Observation Stay (HOSPITAL_COMMUNITY)
Admission: EM | Admit: 2020-12-28 | Discharge: 2020-12-29 | Disposition: A | Discharge status: Home / Self Care | Payer: Medicare Other | Attending: Family Medicine | Admitting: Family Medicine

## 2020-12-28 DIAGNOSIS — I4891 Unspecified atrial fibrillation: Secondary | ICD-10-CM | POA: Diagnosis present

## 2020-12-28 DIAGNOSIS — E872 Acidosis, unspecified: Secondary | ICD-10-CM | POA: Diagnosis present

## 2020-12-28 DIAGNOSIS — K219 Gastro-esophageal reflux disease without esophagitis: Secondary | ICD-10-CM | POA: Diagnosis present

## 2020-12-28 DIAGNOSIS — J45901 Unspecified asthma with (acute) exacerbation: Secondary | ICD-10-CM | POA: Diagnosis present

## 2020-12-28 DIAGNOSIS — Z853 Personal history of malignant neoplasm of breast: Secondary | ICD-10-CM

## 2020-12-28 DIAGNOSIS — R55 Syncope and collapse: Secondary | ICD-10-CM | POA: Diagnosis present

## 2020-12-28 DIAGNOSIS — N390 Urinary tract infection, site not specified: Secondary | ICD-10-CM | POA: Diagnosis present

## 2020-12-28 DIAGNOSIS — T1490XA Injury, unspecified, initial encounter: Secondary | ICD-10-CM

## 2020-12-28 DIAGNOSIS — S022XXB Fracture of nasal bones, initial encounter for open fracture: Secondary | ICD-10-CM

## 2020-12-28 DIAGNOSIS — F411 Generalized anxiety disorder: Secondary | ICD-10-CM | POA: Diagnosis present

## 2020-12-28 DIAGNOSIS — I959 Hypotension, unspecified: Secondary | ICD-10-CM

## 2020-12-28 DIAGNOSIS — W19XXXA Unspecified fall, initial encounter: Secondary | ICD-10-CM

## 2020-12-28 DIAGNOSIS — E871 Hypo-osmolality and hyponatremia: Secondary | ICD-10-CM | POA: Diagnosis present

## 2020-12-28 DIAGNOSIS — S0993XA Unspecified injury of face, initial encounter: Secondary | ICD-10-CM | POA: Diagnosis present

## 2020-12-28 DIAGNOSIS — Z7901 Long term (current) use of anticoagulants: Secondary | ICD-10-CM | POA: Insufficient documentation

## 2020-12-28 DIAGNOSIS — Z20822 Contact with and (suspected) exposure to covid-19: Secondary | ICD-10-CM | POA: Diagnosis not present

## 2020-12-28 DIAGNOSIS — W01198A Fall on same level from slipping, tripping and stumbling with subsequent striking against other object, initial encounter: Secondary | ICD-10-CM | POA: Insufficient documentation

## 2020-12-28 DIAGNOSIS — Y906 Blood alcohol level of 120-199 mg/100 ml: Secondary | ICD-10-CM | POA: Insufficient documentation

## 2020-12-28 DIAGNOSIS — Z79899 Other long term (current) drug therapy: Secondary | ICD-10-CM | POA: Diagnosis not present

## 2020-12-28 DIAGNOSIS — I10 Essential (primary) hypertension: Secondary | ICD-10-CM | POA: Insufficient documentation

## 2020-12-28 DIAGNOSIS — E861 Hypovolemia: Secondary | ICD-10-CM

## 2020-12-28 DIAGNOSIS — J45909 Unspecified asthma, uncomplicated: Secondary | ICD-10-CM | POA: Insufficient documentation

## 2020-12-28 DIAGNOSIS — I9589 Other hypotension: Secondary | ICD-10-CM

## 2020-12-28 DIAGNOSIS — Y9301 Activity, walking, marching and hiking: Secondary | ICD-10-CM | POA: Insufficient documentation

## 2020-12-28 HISTORY — DX: Unspecified atrial fibrillation: I48.91

## 2020-12-28 HISTORY — DX: Essential (primary) hypertension: I10

## 2020-12-28 HISTORY — DX: Unspecified asthma, uncomplicated: J45.909

## 2020-12-28 HISTORY — DX: Cardiac arrhythmia, unspecified: I49.9

## 2020-12-28 LAB — CBC
HCT: 37.5 % (ref 36.0–46.0)
Hemoglobin: 12.4 g/dL (ref 12.0–15.0)
MCH: 35.9 pg — ABNORMAL HIGH (ref 26.0–34.0)
MCHC: 33.1 g/dL (ref 30.0–36.0)
MCV: 108.7 fL — ABNORMAL HIGH (ref 80.0–100.0)
Platelets: 198 10*3/uL (ref 150–400)
RBC: 3.45 MIL/uL — ABNORMAL LOW (ref 3.87–5.11)
RDW: 12.9 % (ref 11.5–15.5)
WBC: 6.6 10*3/uL (ref 4.0–10.5)
nRBC: 0 % (ref 0.0–0.2)

## 2020-12-28 LAB — I-STAT CHEM 8, ED
BUN: 12 mg/dL (ref 8–23)
Calcium, Ion: 1.13 mmol/L — ABNORMAL LOW (ref 1.15–1.40)
Chloride: 96 mmol/L — ABNORMAL LOW (ref 98–111)
Creatinine, Ser: 1 mg/dL (ref 0.44–1.00)
Glucose, Bld: 96 mg/dL (ref 70–99)
HCT: 40 % (ref 36.0–46.0)
Hemoglobin: 13.6 g/dL (ref 12.0–15.0)
Potassium: 4.2 mmol/L (ref 3.5–5.1)
Sodium: 133 mmol/L — ABNORMAL LOW (ref 135–145)
TCO2: 26 mmol/L (ref 22–32)

## 2020-12-28 LAB — TYPE AND SCREEN
ABO/RH(D): O POS
Antibody Screen: NEGATIVE

## 2020-12-28 LAB — PHOSPHORUS: Phosphorus: 3.7 mg/dL (ref 2.5–4.6)

## 2020-12-28 LAB — COMPREHENSIVE METABOLIC PANEL
ALT: 36 U/L (ref 0–44)
AST: 72 U/L — ABNORMAL HIGH (ref 15–41)
Albumin: 3.2 g/dL — ABNORMAL LOW (ref 3.5–5.0)
Alkaline Phosphatase: 104 U/L (ref 38–126)
Anion gap: 13 (ref 5–15)
BUN: 10 mg/dL (ref 8–23)
CO2: 23 mmol/L (ref 22–32)
Calcium: 9.4 mg/dL (ref 8.9–10.3)
Chloride: 96 mmol/L — ABNORMAL LOW (ref 98–111)
Creatinine, Ser: 0.82 mg/dL (ref 0.44–1.00)
GFR, Estimated: >60 mL/min (ref >60)
Glucose, Bld: 96 mg/dL (ref 70–99)
Potassium: 3.9 mmol/L (ref 3.5–5.1)
Sodium: 132 mmol/L — ABNORMAL LOW (ref 135–145)
Total Bilirubin: 0.8 mg/dL (ref 0.3–1.2)
Total Protein: 6.3 g/dL — ABNORMAL LOW (ref 6.5–8.1)

## 2020-12-28 LAB — URINALYSIS, ROUTINE W REFLEX MICROSCOPIC
Bilirubin Urine: NEGATIVE
Glucose, UA: NEGATIVE mg/dL
Hgb urine dipstick: NEGATIVE
Ketones, ur: NEGATIVE mg/dL
Nitrite: NEGATIVE
Protein, ur: NEGATIVE mg/dL
Specific Gravity, Urine: 1.009 (ref 1.005–1.030)
WBC, UA: >50 WBC/hpf — ABNORMAL HIGH (ref 0–5)
pH: 6 (ref 5.0–8.0)

## 2020-12-28 LAB — RESP PANEL BY RT-PCR (FLU A&B, COVID) ARPGX2
Influenza A by PCR: NEGATIVE
Influenza B by PCR: NEGATIVE
SARS Coronavirus 2 by RT PCR: NEGATIVE

## 2020-12-28 LAB — RAPID URINE DRUG SCREEN, HOSP PERFORMED
Amphetamines: NOT DETECTED
Barbiturates: NOT DETECTED
Benzodiazepines: NOT DETECTED
Cocaine: NOT DETECTED
Opiates: NOT DETECTED
Tetrahydrocannabinol: NOT DETECTED

## 2020-12-28 LAB — TSH: TSH: 1.469 u[IU]/mL (ref 0.350–4.500)

## 2020-12-28 LAB — MAGNESIUM: Magnesium: 1.7 mg/dL (ref 1.7–2.4)

## 2020-12-28 LAB — ETHANOL: Alcohol, Ethyl (B): 183 mg/dL — ABNORMAL HIGH (ref <10)

## 2020-12-28 LAB — LACTIC ACID, PLASMA
Lactic Acid, Venous: 4.3 mmol/L (ref 0.5–1.9)
Lactic Acid, Venous: 3.1 mmol/L (ref 0.5–1.9)

## 2020-12-28 LAB — PROTIME-INR
INR: 2.6 — ABNORMAL HIGH (ref 0.8–1.2)
Prothrombin Time: 27.8 seconds — ABNORMAL HIGH (ref 11.4–15.2)

## 2020-12-28 LAB — TROPONIN I (HIGH SENSITIVITY)
Troponin I (High Sensitivity): 5 ng/L (ref <18)
Troponin I (High Sensitivity): 5 ng/L (ref <18)

## 2020-12-28 LAB — CK: Total CK: 67 U/L (ref 38–234)

## 2020-12-28 LAB — T4, FREE: Free T4: 1 ng/dL (ref 0.61–1.12)

## 2020-12-28 MED ORDER — ALBUTEROL SULFATE (2.5 MG/3ML) 0.083% IN NEBU: 2.5000 mg | INHALATION_SOLUTION | RESPIRATORY_TRACT | Status: DC | PRN

## 2020-12-28 MED ORDER — SODIUM CHLORIDE 0.9 % IV SOLN
75.0000 mL/h | INTRAVENOUS | Status: AC
  Administered 2020-12-28: 75 mL/h via INTRAVENOUS

## 2020-12-28 MED ORDER — TRAMADOL HCL 50 MG PO TABS
50.0000 mg | ORAL_TABLET | Freq: Four times a day (QID) | ORAL | Status: DC | PRN
  Administered 2020-12-29 (×2): 50 mg via ORAL
  Filled 2020-12-28 (×2): qty 1

## 2020-12-28 MED ORDER — LACTATED RINGERS IV BOLUS
1000.0000 mL | Freq: Once | INTRAVENOUS | Status: AC
  Administered 2020-12-28: 1000 mL via INTRAVENOUS

## 2020-12-28 MED ORDER — METHOCARBAMOL 500 MG PO TABS
500.0000 mg | ORAL_TABLET | Freq: Four times a day (QID) | ORAL | Status: DC | PRN
  Administered 2020-12-29: 500 mg via ORAL
  Filled 2020-12-28: qty 1

## 2020-12-28 MED ORDER — SODIUM CHLORIDE 0.9 % IV SOLN
1.0000 g | Freq: Once | INTRAVENOUS | Status: AC
  Administered 2020-12-28: 1 g via INTRAVENOUS
  Filled 2020-12-28: qty 10

## 2020-12-28 MED ORDER — ACETAMINOPHEN 650 MG RE SUPP: 650.0000 mg | Freq: Four times a day (QID) | RECTAL | Status: DC | PRN

## 2020-12-28 MED ORDER — ACETAMINOPHEN 325 MG PO TABS: 650.0000 mg | ORAL_TABLET | Freq: Four times a day (QID) | ORAL | Status: DC | PRN

## 2020-12-28 MED ORDER — HYDROCODONE-ACETAMINOPHEN 5-325 MG PO TABS: 1.0000 | ORAL_TABLET | ORAL | Status: DC | PRN

## 2020-12-28 MED ORDER — SODIUM CHLORIDE 0.9 % IV SOLN: 1.0000 g | INTRAVENOUS | Status: DC

## 2020-12-28 NOTE — ED Notes (Signed)
Verbal order for repeat lactic placed and collected after 1 L IVF
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
## 2020-12-28 NOTE — H&P (Addendum)
[name] [id number] DOB: 03-07-1941 DOA: 12/28/2020   PCP: Lawerance Cruel, MD   Outpatient Specialists:   CARDS:  Dr. [name]    Oncology Dr.Magrinant    Patient arrived to ER on 12/28/20 at 1628 Referred by Attending Gareth Morgan, MD   Patient coming from: home Lives  With family    Chief Complaint:   Chief Complaint  Patient presents with   Fall    HPI: [name] is a 80 y.o. female with medical history significant of  A.fib on Xarelto, breast cancer on po chemo    Presented with  fall had a bright flush of light  She was eating lunch  had to climb stairs to get to the restaurant, Had [place] there on empty stomach ate lunch Husband drove them home she got out of the car and saw bright light next thing she knew she was falling forward  episode fell forward and hit concreate with her head Initial bp 72/30 Her Losartan was bumped up last week   No CP, no SOB , no Diarrhea Had minimal discharge no dysuria Does not smoke or drink on regular basis  Has  been vaccinated against COVID  and boosted   Initial COVID TEST  NEGATIVE   Lab Results  Component Value Date   [place] NEGATIVE 12/28/2020     Regarding pertinent Chronic problems:     HTN on NOrvasc propranolol    Echocardiogram November 2021 showed normal LV function, mild biatrial enlargement, trace aortic insufficiency.  A. Fib -  - CHA2DS2 vas score   >= 2    current  on anticoagulation with Xarelto,           -  Rate control:  Currently controlled with propranolol  While in ER: Lactic acid 4.3 UA showing WBC BP improved with IV fluids notes to have UTI     ED Triage Vitals  Enc Vitals Group     BP 12/28/20 1633 (!) 78/50     Pulse Rate 12/28/20 1634 76     Resp 12/28/20 1638 19     Temp 12/28/20 1638 98.3 F (36.8 C)     Temp Source 12/28/20 1638 Oral     SpO2 12/28/20 1634 94 %     Weight 12/28/20 1629 144 lb (65.3 kg)     Height 12/28/20 1629 5' (1.524 m)      Head Circumference --      Peak Flow --      Pain Score 12/28/20 1633 4     Pain Loc --      Pain Edu? --      Excl. in [place]? --   TMAX(24)@     _________________________________________ Significant initial  Findings: Abnormal Labs Reviewed  COMPREHENSIVE METABOLIC PANEL - Abnormal; Notable for the following components:      Result Value   Sodium 132 (*)    Chloride 96 (*)    Total Protein 6.3 (*)    Albumin 3.2 (*)    AST 72 (*)    All other components within normal limits  CBC - Abnormal; Notable for the following components:   RBC 3.45 (*)    MCV 108.7 (*)    MCH 35.9 (*)    All other components within normal limits  ETHANOL - Abnormal; Notable for the following components:   Alcohol, Ethyl (B) 183 (*)    All other components within normal limits  URINALYSIS, ROUTINE W REFLEX MICROSCOPIC - Abnormal;  Notable for the following components:   APPearance HAZY (*)    Leukocytes,Ua LARGE (*)    WBC, UA >50 (*)    Bacteria, UA RARE (*)    Non Squamous Epithelial 0-5 (*)    All other components within normal limits  LACTIC ACID, PLASMA - Abnormal; Notable for the following components:   Lactic Acid, Venous 4.3 (*)    All other components within normal limits  PROTIME-INR - Abnormal; Notable for the following components:   Prothrombin Time 27.8 (*)    INR 2.6 (*)    All other components within normal limits  I-STAT CHEM 8, ED - Abnormal; Notable for the following components:   Sodium 133 (*)    Chloride 96 (*)    Calcium, Ion 1.13 (*)    All other components within normal limits   ____________________________________________ Ordered CT HEAD  NON acute  CXR -  NON acute Pelvis negative   _________________________ Troponin 5 ECG: Ordered Personally reviewed by me showing: HR : 64 Rhythm:  NSR,   Paced no evidence of ischemic changes QTC 365   The recent clinical data is shown below. Vitals:   12/28/20 1833 12/28/20 1845 12/28/20 1915 12/28/20 1945  BP: (!)  86/74 (!) 95/59 99/61 110/67  Pulse: 80 82 68 69  Resp: '16  18 17  '$ Temp:      TempSrc:      SpO2: 97% 99% 99% 98%  Weight:      Height:          WBC     Component Value Date/Time   WBC 6.6 12/28/2020 1641    Lactic Acid, Venous    Component Value Date/Time   LATICACIDVEN 4.3 (HH) 12/28/2020 1641        UA  evidence of UTI      Urine analysis:    Component Value Date/Time   COLORURINE YELLOW 12/28/2020 1641   APPEARANCEUR HAZY (A) 12/28/2020 1641   LABSPEC 1.009 12/28/2020 1641   PHURINE 6.0 12/28/2020 1641   GLUCOSEU NEGATIVE 12/28/2020 1641   HGBUR NEGATIVE 12/28/2020 1641   BILIRUBINUR NEGATIVE 12/28/2020 1641   KETONESUR NEGATIVE 12/28/2020 1641   PROTEINUR NEGATIVE 12/28/2020 1641   NITRITE NEGATIVE 12/28/2020 1641   LEUKOCYTESUR LARGE (A) 12/28/2020 1641    Results for orders placed or performed during the hospital encounter of 12/28/20  Resp Panel by RT-PCR (Flu A&B, Covid) Nasopharyngeal Swab     Status: None   Collection Time: 12/28/20  6:48 PM   Specimen: Nasopharyngeal Swab; Nasopharyngeal(NP) swabs in vial transport medium  Result Value Ref Range Status   SARS Coronavirus 2 by RT PCR NEGATIVE NEGATIVE Final         Influenza A by PCR NEGATIVE NEGATIVE Final   Influenza B by PCR NEGATIVE NEGATIVE Final            _______________________________________________ Hospitalist was called for admission for syncope and dehydration  The following Work up has been ordered so far:  Orders Placed This Encounter  Procedures   Resp Panel by RT-PCR (Flu A&B, Covid) Nasopharyngeal Swab   Blood culture (routine x 2)   DG Chest Port 1 View   DG Pelvis Portable   CT HEAD WO CONTRAST   CT MAXILLOFACIAL WO CONTRAST   CT CERVICAL SPINE WO CONTRAST   Comprehensive metabolic panel   CBC   Ethanol   Urinalysis, Routine w reflex microscopic   Lactic acid, plasma   Protime-INR   Magnesium   TSH  T4, free   Lactic acid, plasma   Diet NPO time specified    Cardiac monitoring   Measure blood pressure   Initiate Carrier Fluid Protocol   Consult to hospitalist   Airborne and Contact precautions   Pulse oximetry, continuous   I-Stat Chem 8, ED   EKG 12-Lead   ED EKG   Type and screen [place]   ABO/Rh    Medications were ordered in ER: Medications  lactated ringers bolus 1,000 mL (0 mLs Intravenous Stopped 12/28/20 1821)        Consult Orders  (From admission, onward)           Start     Ordered   12/28/20 1948  Consult to hospitalist  Once       Provider:  (Not yet assigned)  Question Answer Comment  Place call to: [facility]   Reason for Consult Admit      12/28/20 1947              OTHER Significant initial  Findings:  labs showing:    Recent Labs  Lab 12/28/20 1641 12/28/20 1723  NA 132* 133*  K 3.9 4.2  CO2 23  --   GLUCOSE 96 96  BUN 10 12  CREATININE 0.82 1.00  CALCIUM 9.4  --   MG 1.7  --     Cr     Up from baseline see below Lab Results  Component Value Date   CREATININE 1.00 12/28/2020   CREATININE 0.82 12/28/2020    Recent Labs  Lab 12/28/20 1641  AST 72*  ALT 36  ALKPHOS 104  BILITOT 0.8  PROT 6.3*  ALBUMIN 3.2*   Lab Results  Component Value Date   CALCIUM 9.4 12/28/2020       Plt: Lab Results  Component Value Date   PLT 198 12/28/2020      Recent Labs  Lab 12/28/20 1641 12/28/20 1723  WBC 6.6  --   HGB 12.4 13.6  HCT 37.5 40.0  MCV 108.7*  --   PLT 198  --     HG/HCT * stable,  Down *Up from baseline see below    Component Value Date/Time   HGB 13.6 12/28/2020 1723   HCT 40.0 12/28/2020 1723   MCV 108.7 (H) 12/28/2020 1641    Cardiac Panel (last 3 results) No results for input(s): CKTOTAL, CKMB, TROPONINI, RELINDX in the last 72 hours.   BNP (last 3 results) No results for input(s): BNP in the last 8760 hours.    DM  labs:  HbA1C: No results for input(s): HGBA1C in the last 8760 hours.     CBG (last 3)  No results  for input(s): GLUCAP in the last 72 hours.        Cultures: No results found for: SDES, SPECREQUEST, CULT, REPTSTATUS   Radiological Exams on Admission: CT HEAD WO CONTRAST  Result Date: 12/28/2020 CLINICAL DATA:  Pt walking on back patio, pt says suddenly bright flash of light into eyes, fell forward onto concrete. Denies neck or back pain. Pt with hematoma to forehead. EXAM: CT HEAD WITHOUT CONTRAST CT MAXILLOFACIAL WITHOUT CONTRAST CT CERVICAL SPINE WITHOUT CONTRAST TECHNIQUE: Multidetector CT imaging of the head, cervical spine, and maxillofacial structures were performed using the standard protocol without intravenous contrast. Multiplanar CT image reconstructions of the cervical spine and maxillofacial structures were also generated. COMPARISON:  Head CT, 08/18/2020 FINDINGS: CT HEAD FINDINGS Brain: No evidence of acute infarction, hemorrhage, hydrocephalus,  extra-axial collection or mass lesion/mass effect. Vascular: No hyperdense vessel or unexpected calcification. Skull: Normal. Negative for fracture or focal lesion. Other: Low forehead scalp contusion. CT MAXILLOFACIAL FINDINGS Osseous: Mildly comminuted fractures of the nasal bone, minimally depressed, 1-2 mm. No other fractures.  No bone lesions. Orbits: Negative. No traumatic or inflammatory finding. Sinuses: Chronic near complete opacification of the right maxillary sinus. Opacified posterior left ethmoid air cells. Mostly opacified hypoplastic sphenoid sinuses. These findings are stable from head CT dated 08/18/2020. Remaining sinuses are clear. Clear mastoid air cells and middle ear cavities. Soft tissues: Mild right low central forehead scalp contusion. Mild nasal soft tissue swelling suggested. CT CERVICAL SPINE FINDINGS Alignment: Slight reversal of the normal cervical lordosis, apex at C4-C5. No spondylolisthesis. Skull base and vertebrae: No acute fracture. No primary bone lesion or focal pathologic process. Soft tissues and spinal  canal: No prevertebral fluid or swelling. No visible canal hematoma. Disc levels: Mild loss of disc height at C2-C3. Moderate to marked loss of disc height at C3-C4. Marked loss of disc height at C4-C5, C5-C6 and C6-C7. Mild spondylotic disc bulging and endplate spurring noted throughout these levels. There are facet degenerative changes greatest at C2-C3 and C3-C4. No convincing disc herniation. Upper chest: No acute findings. Focal opacity at the right apex is consistent with scarring. Other: None. IMPRESSION: HEAD CT 1. No acute intracranial abnormalities. 2. Small low forehead scalp hematoma.  No skull fracture. MAXILLOFACIAL CT 1. Mildly comminuted, minimally depressed, nasal fractures. 2. No other fractures. CERVICAL CT 1. No fracture or acute finding. Electronically Signed   By: Lajean Manes M.D.   On: 12/28/2020 18:08   CT CERVICAL SPINE WO CONTRAST  Result Date: 12/28/2020 CLINICAL DATA:  Pt walking on back patio, pt says suddenly bright flash of light into eyes, fell forward onto concrete. Denies neck or back pain. Pt with hematoma to forehead. EXAM: CT HEAD WITHOUT CONTRAST CT MAXILLOFACIAL WITHOUT CONTRAST CT CERVICAL SPINE WITHOUT CONTRAST TECHNIQUE: Multidetector CT imaging of the head, cervical spine, and maxillofacial structures were performed using the standard protocol without intravenous contrast. Multiplanar CT image reconstructions of the cervical spine and maxillofacial structures were also generated. COMPARISON:  Head CT, 08/18/2020 FINDINGS: CT HEAD FINDINGS Brain: No evidence of acute infarction, hemorrhage, hydrocephalus, extra-axial collection or mass lesion/mass effect. Vascular: No hyperdense vessel or unexpected calcification. Skull: Normal. Negative for fracture or focal lesion. Other: Low forehead scalp contusion. CT MAXILLOFACIAL FINDINGS Osseous: Mildly comminuted fractures of the nasal bone, minimally depressed, 1-2 mm. No other fractures.  No bone lesions. Orbits: Negative.  No traumatic or inflammatory finding. Sinuses: Chronic near complete opacification of the right maxillary sinus. Opacified posterior left ethmoid air cells. Mostly opacified hypoplastic sphenoid sinuses. These findings are stable from head CT dated 08/18/2020. Remaining sinuses are clear. Clear mastoid air cells and middle ear cavities. Soft tissues: Mild right low central forehead scalp contusion. Mild nasal soft tissue swelling suggested. CT CERVICAL SPINE FINDINGS Alignment: Slight reversal of the normal cervical lordosis, apex at C4-C5. No spondylolisthesis. Skull base and vertebrae: No acute fracture. No primary bone lesion or focal pathologic process. Soft tissues and spinal canal: No prevertebral fluid or swelling. No visible canal hematoma. Disc levels: Mild loss of disc height at C2-C3. Moderate to marked loss of disc height at C3-C4. Marked loss of disc height at C4-C5, C5-C6 and C6-C7. Mild spondylotic disc bulging and endplate spurring noted throughout these levels. There are facet degenerative changes greatest at C2-C3 and C3-C4. No convincing  disc herniation. Upper chest: No acute findings. Focal opacity at the right apex is consistent with scarring. Other: None. IMPRESSION: HEAD CT 1. No acute intracranial abnormalities. 2. Small low forehead scalp hematoma.  No skull fracture. MAXILLOFACIAL CT 1. Mildly comminuted, minimally depressed, nasal fractures. 2. No other fractures. CERVICAL CT 1. No fracture or acute finding. Electronically Signed   By: Lajean Manes M.D.   On: 12/28/2020 18:08   DG Pelvis Portable  Result Date: 12/28/2020 CLINICAL DATA:  Fall. EXAM: PORTABLE PELVIS 1-2 VIEWS COMPARISON:  February 20, 2010. FINDINGS: There is no evidence of pelvic fracture or diastasis. No pelvic bone lesions are seen. IMPRESSION: Negative. Electronically Signed   By: Marijo Conception M.D.   On: 12/28/2020 16:54   DG Chest Port 1 View  Result Date: 12/28/2020 CLINICAL DATA:  Fall. EXAM: PORTABLE  CHEST 1 VIEW COMPARISON:  January 29, 2017. FINDINGS: The heart size and mediastinal contours are within normal limits. Both lungs are clear. The visualized skeletal structures are unremarkable. IMPRESSION: No active disease. Electronically Signed   By: Marijo Conception M.D.   On: 12/28/2020 16:53   CT MAXILLOFACIAL WO CONTRAST  Result Date: 12/28/2020 CLINICAL DATA:  Pt walking on back patio, pt says suddenly bright flash of light into eyes, fell forward onto concrete. Denies neck or back pain. Pt with hematoma to forehead. EXAM: CT HEAD WITHOUT CONTRAST CT MAXILLOFACIAL WITHOUT CONTRAST CT CERVICAL SPINE WITHOUT CONTRAST TECHNIQUE: Multidetector CT imaging of the head, cervical spine, and maxillofacial structures were performed using the standard protocol without intravenous contrast. Multiplanar CT image reconstructions of the cervical spine and maxillofacial structures were also generated. COMPARISON:  Head CT, 08/18/2020 FINDINGS: CT HEAD FINDINGS Brain: No evidence of acute infarction, hemorrhage, hydrocephalus, extra-axial collection or mass lesion/mass effect. Vascular: No hyperdense vessel or unexpected calcification. Skull: Normal. Negative for fracture or focal lesion. Other: Low forehead scalp contusion. CT MAXILLOFACIAL FINDINGS Osseous: Mildly comminuted fractures of the nasal bone, minimally depressed, 1-2 mm. No other fractures.  No bone lesions. Orbits: Negative. No traumatic or inflammatory finding. Sinuses: Chronic near complete opacification of the right maxillary sinus. Opacified posterior left ethmoid air cells. Mostly opacified hypoplastic sphenoid sinuses. These findings are stable from head CT dated 08/18/2020. Remaining sinuses are clear. Clear mastoid air cells and middle ear cavities. Soft tissues: Mild right low central forehead scalp contusion. Mild nasal soft tissue swelling suggested. CT CERVICAL SPINE FINDINGS Alignment: Slight reversal of the normal cervical lordosis, apex at  C4-C5. No spondylolisthesis. Skull base and vertebrae: No acute fracture. No primary bone lesion or focal pathologic process. Soft tissues and spinal canal: No prevertebral fluid or swelling. No visible canal hematoma. Disc levels: Mild loss of disc height at C2-C3. Moderate to marked loss of disc height at C3-C4. Marked loss of disc height at C4-C5, C5-C6 and C6-C7. Mild spondylotic disc bulging and endplate spurring noted throughout these levels. There are facet degenerative changes greatest at C2-C3 and C3-C4. No convincing disc herniation. Upper chest: No acute findings. Focal opacity at the right apex is consistent with scarring. Other: None. IMPRESSION: HEAD CT 1. No acute intracranial abnormalities. 2. Small low forehead scalp hematoma.  No skull fracture. MAXILLOFACIAL CT 1. Mildly comminuted, minimally depressed, nasal fractures. 2. No other fractures. CERVICAL CT 1. No fracture or acute finding. Electronically Signed   By: Lajean Manes M.D.   On: 12/28/2020 18:08   _______________________________________________________________________________________________________ Latest  Blood pressure 110/67, pulse 69, temperature 98.3 F (36.8 C), temperature source  Oral, resp. rate 17, height 5' (1.524 m), weight 65.3 kg, SpO2 98 %.   Review of Systems:    Pertinent positives include:  fatigue,   Constitutional:  No weight loss, night sweats, Fevers, chills, weight loss  HEENT:  No headaches, Difficulty swallowing,Tooth/dental problems,Sore throat,  No sneezing, itching, ear ache, nasal congestion, post nasal drip,  Cardio-vascular:  No chest pain, Orthopnea, PND, anasarca, dizziness, palpitations.no Bilateral lower extremity swelling  GI:  No heartburn, indigestion, abdominal pain, nausea, vomiting, diarrhea, change in bowel habits, loss of appetite, melena, blood in stool, hematemesis Resp:  no shortness of breath at rest. No dyspnea on exertion, No excess mucus, no productive cough, No  non-productive cough, No coughing up of blood.No change in color of mucus.No wheezing. Skin:  no rash or lesions. No jaundice GU:  no dysuria, change in color of urine, no urgency or frequency. No straining to urinate.  No flank pain.  Musculoskeletal:  No joint pain or no joint swelling. No decreased range of motion. No back pain.  Psych:  No change in mood or affect. No depression or anxiety. No memory loss.  Neuro: no localizing neurological complaints, no tingling, no weakness, no double vision, no gait abnormality, no slurred speech, no confusion  All systems reviewed and apart from [place] all are negative _______________________________________________________________________________________________ Past Medical History:   Past Medical History:  Diagnosis Date   A-fib ([place])    Asthma    Dysrhythmia    Hypertension       Past Surgical History:  Procedure Laterality Date   JOINT REPLACEMENT      Social History:  Ambulatory  independently       reports that she has never smoked. She has never used smokeless tobacco. She reports current alcohol use of about 1.0 standard drink of alcohol per week. No history on file for drug use.     Family History:   Family History  Problem Relation Age of Onset   Hypertension Other    ______________________________________________________________________________________________ Allergies: Allergies  Allergen Reactions   Chlorhexidine    Doxycycline      Prior to Admission medications   Not on File    ALPRAZolam (XANAX) 0.5 MG tablet Take 0.5 mg by mouth daily as needed for anxiety.       amLODipine (NORVASC) 5 MG tablet TAKE 1 TABLET BY MOUTH EVERY DAY 90 tablet 1   BREO ELLIPTA 100-25 MCG/INH AEPB Inhale 1 puff into the lungs daily.       desonide (DESOWEN) 0.05 % cream Apply 1 application topically 2 (two) times daily as needed (for skin irritation.).       diclofenac sodium (VOLTAREN) 1 % GEL Apply 2 g topically daily  as needed (pain).       famotidine (PEPCID) 20 MG tablet Take 1 tablet by mouth as directed.       fluticasone (FLONASE) 50 MCG/ACT nasal spray fluticasone propionate 50 mcg/actuation nasal spray,suspension  SPRAY 2 SPRAYS INTO EACH NOSTRIL EVERY DAY FOR 30 DAYS       furosemide (LASIX) 20 MG tablet Take 1 tablet (20 mg total) by mouth daily. 90 tablet 1   letrozole (FEMARA) 2.5 MG tablet Take 1 tablet (2.5 mg total) by mouth daily. 90 tablet 4   Polyethyl Glycol-Propyl Glycol (SYSTANE OP) Place 1 drop into both eyes daily.       potassium chloride SA (KLOR-CON) 20 MEQ tablet Take 1 tablet (20 mEq total) by mouth daily. 90 tablet 3  propranolol ER (INDERAL LA) 80 MG 24 hr capsule Take 80 mg by mouth daily.       venlafaxine XR (EFFEXOR-XR) 75 MG 24 hr capsule TAKE 1 CAPSULE (75 MG TOTAL) BY MOUTH DAILY WITH BREAKFAST. 90 capsule 2   XARELTO 20 MG TABS tablet TAKE 1 TABLET BY MOUTH DAILY WITH SUPPER. 90 tablet 1   albuterol (PROVENTIL HFA;VENTOLIN HFA) 108 (90 Base) MCG/ACT inhaler Inhale 2 puffs into the lungs every 6 (six) hours as needed for wheezing or shortness of breath. For wheezing/shortness of breath (Patient not taking: Reported on 11/11/2020)       EPINEPHrine (EPIPEN 2-PAK) 0.3 mg/0.3 mL IJ SOAJ injection Inject 0.3 mg into the muscle once.       levocetirizine (XYZAL) 5 MG tablet Take 5 mg by mouth at bedtime.  (Patient not taking: Reported on 11/11/2020)        No current facility-administered medications for this visit.     ___________________________________________________________________________________________________ Physical Exam: Vitals with BMI 12/28/2020 12/28/2020 12/28/2020  Height - - -  Weight - - -  BMI - - -  Systolic [id number] 99 95  Diastolic 67 61 59  Pulse 69 68 82    1. General:  in No  Acute distress    Chronically ill   -appearing 2. Psychological: Alert and   Oriented 3. Head/ENT:    Dry Mucous Membranes                          Head   traumatic, hematoma and  abrasion of the forehead neck supple                          Poor Dentition 4. SKIN:  decreased Skin turgor,  Skin clean Dry and intact no rash 5. Heart: Regular rate and rhythm no  Murmur, no Rub or gallop 6. Lungs:   no wheezes or crackles   7. Abdomen: Soft,  non-tender, Non distended  bowel sounds present 8. Lower extremities: no clubbing, cyanosis, no  edema 9. Neurologically Grossly intact, moving all 4 extremities equally   10. MSK: Normal range of motion    Chart has been reviewed  ______________________________________________________________________________________________  Assessment/Plan  80 y.o. female with medical history significant of  A.fib on Xarelto, breast cancer on po chemo  Admitted for syncope due to hypotension  Present on Admission:  Syncope in the setting of hypotension hold home medications.  Gently rehydrate Repeat echogram obtain orthostatics notified cardiology patient is being admitted No evidence of ischemia on work-up currently   Hypotension -hold home medications for tonight   Hyponatremia -likely secondary to mild dehydration we will hold home medications this could have contributed.  Obtain urine electrolytes gently rehydrate and follow   A-fib ([place]) -restart Xarelto, would attempt to restart propranolol prior to discharge if able to tolerate to avoid rebound tachycardia   Asthma, chronic, unspecified asthma severity, with acute exacerbation -chronic stable continue home medication   GAD (generalized anxiety disorder) -continue home medications stable   GERD (gastroesophageal reflux disease) -continue PPI  UTI -evidence of possible UTI given hemodynamic instability and elevated lactic acid will empirically treat with Rocephin         await results of urine culture and adjust antibiotic coverage as needed  Elevated lactic acid likely secondary to dehydration we will continue to rehydrate and follow Monitor in progressive unit for  now  Breast cancer - continue femara  Nasal fracture - follow up with ENT as an outpt Other plan as per orders.  DVT prophylaxis:  SCD      Code Status:    Code Status: Not on file FULL CODE  as per patient   I had personally discussed CODE STATUS with patient     Family Communication:   Family not at  Bedside    Disposition Plan:      To home once workup is complete and patient is stable   Following barriers for discharge:                            Electrolytes corrected                               able to transition to PO antibiotics                             Will need to be able to tolerate PO                                                   Would benefit from PT/OT eval prior to DC  Ordered                                       Consults called: sent email to cardiology    Admission status:  ED Disposition     ED Disposition  Admit   Condition  --   [place]: [place] [[id number]]  Level of Care: Progressive [102]  Admit to Progressive based on following criteria: CARDIOVASCULAR & THORACIC of moderate stability with acute coronary syndrome symptoms/low risk myocardial infarction/hypertensive urgency/arrhythmias/heart failure potentially compromising stability and stable post cardiovascular intervention patients.  May place patient in observation at [facility] or [facility] if equivalent level of care is available:: No  Covid Evaluation: Confirmed COVID Negative  Diagnosis: Syncope [206001]  Admitting Physician: Toy Baker [[id number]]  Attending Physician: Toy Baker [[id number]]          Obs    Level of care  progressive  tele indefinitely please discontinue once patient no longer qualifies COVID-19 Labs    Lab Results  Component Value Date   [place] 12/28/2020     Precautions: admitted as  Covid Negative   PPE: Used by the provider:   N95   eye Goggles,  Gloves    Salia Cangemi 12/29/2020, 1:45 AM    [facility]     after 2 AM please page floor coverage PA If 7AM-7PM, please contact the day team taking care of the patient using [email or web address]   Patient was evaluated in the context of the global COVID-19 pandemic, which necessitated consideration that the patient might be at risk for infection with the SARS-CoV-2 virus that causes COVID-19. Institutional protocols and algorithms that pertain to the evaluation of patients at risk for COVID-19 are in a state of rapid change based on information released by regulatory bodies including the CDC and federal and state organizations. These policies and algorithms were followed during the patient's care.

## 2020-12-28 NOTE — ED Provider Notes (Addendum)
[facility] EMERGENCY DEPARTMENT Provider Note   CSN: [id number] Arrival date & time: 12/28/20  1628     History Chief Complaint  Patient presents with   [name] is a 80 y.o. female.  HPI     80 year old female with a history of atrial fibrillation on Xarelto, hypertension with recent increase in her losartan a few weeks ago, presents as fall on anticoagulation.  Reports she was in normal state of health today, and had walked up a lot of stairs in the heat and noted some dyspnea.  She ate lunch and after that she was walking back on the patio and saw a sudden bright flash of light and fell forwards hitting her head.  She denies loss of consciousness although unclear.  Reports she has a headache.  Denies neck pain, back pain, numbness, weakness, visual changes, sore throat, congestion, chest pain, shortness of breath, abdominal pain, nausea, vomiting, diarrhea, black or bloody stools.  Reports she took her normal medications today.  She denies taking any pain or anxiety medications.  She did drink today.  Reports that her blood pressure medication few weeks ago.  She has not otherwise noticed any lightheadedness.  Blood pressures down to the 70s on arrival and she arrives as a level 2 trauma for fall on thinners.   Past Medical History:  Diagnosis Date   A-fib [facility])     Patient Active Problem List   Diagnosis Date Noted   Syncope 12/28/2020   Hypotension 12/28/2020   Hyponatremia 12/28/2020     OB History   No obstetric history on file.     No family history on file.     Home Medications Prior to Admission medications   Not on File    Allergies    Chlorhexidine, Doxycycline, Nsaids, and Tylenol [acetaminophen]  Review of Systems   Review of Systems  Constitutional:  Negative for appetite change, fatigue and fever.  HENT:  Negative for sore throat.   Eyes:  Negative for visual disturbance.  Respiratory:  Negative for cough and  shortness of breath.   Cardiovascular:  Negative for chest pain.  Gastrointestinal:  Negative for abdominal pain, nausea and vomiting.  Genitourinary:  Negative for difficulty urinating.  Musculoskeletal:  Negative for back pain and neck pain.  Skin:  Positive for wound. Negative for rash.  Neurological:  Positive for headaches. Negative for syncope, weakness and numbness.   Physical Exam Updated Vital Signs BP (!) 130/59   Pulse 76   Temp 98.3 F (36.8 C) (Oral)   Resp 17   Ht 5' (1.524 m)   Wt 65.3 kg   SpO2 98%   BMI 28.12 kg/m   Physical Exam Vitals and nursing note reviewed.  Constitutional:      General: She is not in acute distress.    Appearance: She is well-developed. She is not diaphoretic.  HENT:     Head: Normocephalic.     Comments: Contusion forehead, superficial .5cm laceration right forehead, 1.5laceration over bridge of nose, no nasal septal hematomas  Eyes:     Conjunctiva/sclera: Conjunctivae normal.  Cardiovascular:     Rate and Rhythm: Normal rate and regular rhythm.     Heart sounds: Normal heart sounds. No murmur heard.   No friction rub. No gallop.  Pulmonary:     Effort: Pulmonary effort is normal. No respiratory distress.     Breath sounds: Normal breath sounds. No wheezing or rales.  Chest:  Chest wall: No tenderness.  Abdominal:     General: There is no distension.     Palpations: Abdomen is soft.     Tenderness: There is no abdominal tenderness. There is no guarding.  Musculoskeletal:        General: No tenderness.     Cervical back: Normal range of motion.  Skin:    General: Skin is warm and dry.     Findings: No erythema or rash.  Neurological:     Mental Status: She is alert and oriented to person, place, and time.    ED Results / Procedures / Treatments   Labs (all labs ordered are listed, but only abnormal results are displayed) Labs Reviewed  COMPREHENSIVE METABOLIC PANEL - Abnormal; Notable for the following components:       Result Value   Sodium 132 (*)    Chloride 96 (*)    Total Protein 6.3 (*)    Albumin 3.2 (*)    AST 72 (*)    All other components within normal limits  CBC - Abnormal; Notable for the following components:   RBC 3.45 (*)    MCV 108.7 (*)    MCH 35.9 (*)    All other components within normal limits  ETHANOL - Abnormal; Notable for the following components:   Alcohol, Ethyl (B) 183 (*)    All other components within normal limits  URINALYSIS, ROUTINE W REFLEX MICROSCOPIC - Abnormal; Notable for the following components:   APPearance HAZY (*)    Leukocytes,Ua LARGE (*)    WBC, UA >50 (*)    Bacteria, UA RARE (*)    Non Squamous Epithelial 0-5 (*)    All other components within normal limits  LACTIC ACID, PLASMA - Abnormal; Notable for the following components:   Lactic Acid, Venous 4.3 (*)    All other components within normal limits  PROTIME-INR - Abnormal; Notable for the following components:   Prothrombin Time 27.8 (*)    INR 2.6 (*)    All other components within normal limits  LACTIC ACID, PLASMA - Abnormal; Notable for the following components:   Lactic Acid, Venous 3.1 (*)    All other components within normal limits  I-STAT CHEM 8, ED - Abnormal; Notable for the following components:   Sodium 133 (*)    Chloride 96 (*)    Calcium, Ion 1.13 (*)    All other components within normal limits  RESP PANEL BY RT-PCR (FLU A&B, COVID) ARPGX2  CULTURE, BLOOD (ROUTINE X 2)  CULTURE, BLOOD (ROUTINE X 2)  MAGNESIUM  TSH  T4, FREE  CK  PHOSPHORUS  RAPID URINE DRUG SCREEN, HOSP PERFORMED  SODIUM, URINE, RANDOM  CREATININE, URINE, RANDOM  OSMOLALITY  OSMOLALITY, URINE  MAGNESIUM  PHOSPHORUS  CBC WITH DIFFERENTIAL/PLATELET  TSH  COMPREHENSIVE METABOLIC PANEL  TYPE AND SCREEN  ABO/RH  TROPONIN I (HIGH SENSITIVITY)  TROPONIN I (HIGH SENSITIVITY)    EKG EKG Interpretation  Date/Time:  Sunday December 28 2020 16:38:11 EDT Ventricular Rate:  64 PR Interval:     QRS Duration: 84 QT Interval:  345 QTC Calculation: 356 R Axis:   79 Text Interpretation: Atrial fibrillation Repol abnrm, severe global ischemia (LM/MVD) No previous ECGs available Confirmed by Gareth Morgan [phone]) on 12/29/2020 1:09:15 AM  Radiology CT HEAD WO CONTRAST  Result Date: 12/28/2020 CLINICAL DATA:  Pt walking on back patio, pt says suddenly bright flash of light into eyes, fell forward onto concrete. Denies neck or back pain. Pt  with hematoma to forehead. EXAM: CT HEAD WITHOUT CONTRAST CT MAXILLOFACIAL WITHOUT CONTRAST CT CERVICAL SPINE WITHOUT CONTRAST TECHNIQUE: Multidetector CT imaging of the head, cervical spine, and maxillofacial structures were performed using the standard protocol without intravenous contrast. Multiplanar CT image reconstructions of the cervical spine and maxillofacial structures were also generated. COMPARISON:  Head CT, 08/18/2020 FINDINGS: CT HEAD FINDINGS Brain: No evidence of acute infarction, hemorrhage, hydrocephalus, extra-axial collection or mass lesion/mass effect. Vascular: No hyperdense vessel or unexpected calcification. Skull: Normal. Negative for fracture or focal lesion. Other: Low forehead scalp contusion. CT MAXILLOFACIAL FINDINGS Osseous: Mildly comminuted fractures of the nasal bone, minimally depressed, 1-2 mm. No other fractures.  No bone lesions. Orbits: Negative. No traumatic or inflammatory finding. Sinuses: Chronic near complete opacification of the right maxillary sinus. Opacified posterior left ethmoid air cells. Mostly opacified hypoplastic sphenoid sinuses. These findings are stable from head CT dated 08/18/2020. Remaining sinuses are clear. Clear mastoid air cells and middle ear cavities. Soft tissues: Mild right low central forehead scalp contusion. Mild nasal soft tissue swelling suggested. CT CERVICAL SPINE FINDINGS Alignment: Slight reversal of the normal cervical lordosis, apex at C4-C5. No spondylolisthesis. Skull base and  vertebrae: No acute fracture. No primary bone lesion or focal pathologic process. Soft tissues and spinal canal: No prevertebral fluid or swelling. No visible canal hematoma. Disc levels: Mild loss of disc height at C2-C3. Moderate to marked loss of disc height at C3-C4. Marked loss of disc height at C4-C5, C5-C6 and C6-C7. Mild spondylotic disc bulging and endplate spurring noted throughout these levels. There are facet degenerative changes greatest at C2-C3 and C3-C4. No convincing disc herniation. Upper chest: No acute findings. Focal opacity at the right apex is consistent with scarring. Other: None. IMPRESSION: HEAD CT 1. No acute intracranial abnormalities. 2. Small low forehead scalp hematoma.  No skull fracture. MAXILLOFACIAL CT 1. Mildly comminuted, minimally depressed, nasal fractures. 2. No other fractures. CERVICAL CT 1. No fracture or acute finding. Electronically Signed   By: Lajean Manes M.D.   On: 12/28/2020 18:08   CT CERVICAL SPINE WO CONTRAST  Result Date: 12/28/2020 CLINICAL DATA:  Pt walking on back patio, pt says suddenly bright flash of light into eyes, fell forward onto concrete. Denies neck or back pain. Pt with hematoma to forehead. EXAM: CT HEAD WITHOUT CONTRAST CT MAXILLOFACIAL WITHOUT CONTRAST CT CERVICAL SPINE WITHOUT CONTRAST TECHNIQUE: Multidetector CT imaging of the head, cervical spine, and maxillofacial structures were performed using the standard protocol without intravenous contrast. Multiplanar CT image reconstructions of the cervical spine and maxillofacial structures were also generated. COMPARISON:  Head CT, 08/18/2020 FINDINGS: CT HEAD FINDINGS Brain: No evidence of acute infarction, hemorrhage, hydrocephalus, extra-axial collection or mass lesion/mass effect. Vascular: No hyperdense vessel or unexpected calcification. Skull: Normal. Negative for fracture or focal lesion. Other: Low forehead scalp contusion. CT MAXILLOFACIAL FINDINGS Osseous: Mildly comminuted fractures  of the nasal bone, minimally depressed, 1-2 mm. No other fractures.  No bone lesions. Orbits: Negative. No traumatic or inflammatory finding. Sinuses: Chronic near complete opacification of the right maxillary sinus. Opacified posterior left ethmoid air cells. Mostly opacified hypoplastic sphenoid sinuses. These findings are stable from head CT dated 08/18/2020. Remaining sinuses are clear. Clear mastoid air cells and middle ear cavities. Soft tissues: Mild right low central forehead scalp contusion. Mild nasal soft tissue swelling suggested. CT CERVICAL SPINE FINDINGS Alignment: Slight reversal of the normal cervical lordosis, apex at C4-C5. No spondylolisthesis. Skull base and vertebrae: No acute fracture. No primary bone  lesion or focal pathologic process. Soft tissues and spinal canal: No prevertebral fluid or swelling. No visible canal hematoma. Disc levels: Mild loss of disc height at C2-C3. Moderate to marked loss of disc height at C3-C4. Marked loss of disc height at C4-C5, C5-C6 and C6-C7. Mild spondylotic disc bulging and endplate spurring noted throughout these levels. There are facet degenerative changes greatest at C2-C3 and C3-C4. No convincing disc herniation. Upper chest: No acute findings. Focal opacity at the right apex is consistent with scarring. Other: None. IMPRESSION: HEAD CT 1. No acute intracranial abnormalities. 2. Small low forehead scalp hematoma.  No skull fracture. MAXILLOFACIAL CT 1. Mildly comminuted, minimally depressed, nasal fractures. 2. No other fractures. CERVICAL CT 1. No fracture or acute finding. Electronically Signed   By: Lajean Manes M.D.   On: 12/28/2020 18:08   DG Pelvis Portable  Result Date: 12/28/2020 CLINICAL DATA:  Fall. EXAM: PORTABLE PELVIS 1-2 VIEWS COMPARISON:  February 20, 2010. FINDINGS: There is no evidence of pelvic fracture or diastasis. No pelvic bone lesions are seen. IMPRESSION: Negative. Electronically Signed   By: Marijo Conception M.D.   On:  12/28/2020 16:54   DG Chest Port 1 View  Result Date: 12/28/2020 CLINICAL DATA:  Fall. EXAM: PORTABLE CHEST 1 VIEW COMPARISON:  January 29, 2017. FINDINGS: The heart size and mediastinal contours are within normal limits. Both lungs are clear. The visualized skeletal structures are unremarkable. IMPRESSION: No active disease. Electronically Signed   By: Marijo Conception M.D.   On: 12/28/2020 16:53   CT MAXILLOFACIAL WO CONTRAST  Result Date: 12/28/2020 CLINICAL DATA:  Pt walking on back patio, pt says suddenly bright flash of light into eyes, fell forward onto concrete. Denies neck or back pain. Pt with hematoma to forehead. EXAM: CT HEAD WITHOUT CONTRAST CT MAXILLOFACIAL WITHOUT CONTRAST CT CERVICAL SPINE WITHOUT CONTRAST TECHNIQUE: Multidetector CT imaging of the head, cervical spine, and maxillofacial structures were performed using the standard protocol without intravenous contrast. Multiplanar CT image reconstructions of the cervical spine and maxillofacial structures were also generated. COMPARISON:  Head CT, 08/18/2020 FINDINGS: CT HEAD FINDINGS Brain: No evidence of acute infarction, hemorrhage, hydrocephalus, extra-axial collection or mass lesion/mass effect. Vascular: No hyperdense vessel or unexpected calcification. Skull: Normal. Negative for fracture or focal lesion. Other: Low forehead scalp contusion. CT MAXILLOFACIAL FINDINGS Osseous: Mildly comminuted fractures of the nasal bone, minimally depressed, 1-2 mm. No other fractures.  No bone lesions. Orbits: Negative. No traumatic or inflammatory finding. Sinuses: Chronic near complete opacification of the right maxillary sinus. Opacified posterior left ethmoid air cells. Mostly opacified hypoplastic sphenoid sinuses. These findings are stable from head CT dated 08/18/2020. Remaining sinuses are clear. Clear mastoid air cells and middle ear cavities. Soft tissues: Mild right low central forehead scalp contusion. Mild nasal soft tissue swelling  suggested. CT CERVICAL SPINE FINDINGS Alignment: Slight reversal of the normal cervical lordosis, apex at C4-C5. No spondylolisthesis. Skull base and vertebrae: No acute fracture. No primary bone lesion or focal pathologic process. Soft tissues and spinal canal: No prevertebral fluid or swelling. No visible canal hematoma. Disc levels: Mild loss of disc height at C2-C3. Moderate to marked loss of disc height at C3-C4. Marked loss of disc height at C4-C5, C5-C6 and C6-C7. Mild spondylotic disc bulging and endplate spurring noted throughout these levels. There are facet degenerative changes greatest at C2-C3 and C3-C4. No convincing disc herniation. Upper chest: No acute findings. Focal opacity at the right apex is consistent with scarring. Other: None.  IMPRESSION: HEAD CT 1. No acute intracranial abnormalities. 2. Small low forehead scalp hematoma.  No skull fracture. MAXILLOFACIAL CT 1. Mildly comminuted, minimally depressed, nasal fractures. 2. No other fractures. CERVICAL CT 1. No fracture or acute finding. Electronically Signed   By: Lajean Manes M.D.   On: 12/28/2020 18:08    Procedures .Marland KitchenLaceration Repair  Date/Time: 12/29/2020 1:07 AM Performed by: Gareth Morgan, MD Authorized by: Gareth Morgan, MD   Consent:    Consent obtained:  Verbal   Consent given by:  Patient   Risks, benefits, and alternatives were discussed: yes     Risks discussed:  Infection and pain Universal protocol:    Patient identity confirmed:  Verbally with patient Laceration details:    Location:  Face   Face location:  Nose   Length (cm):  1.5 Pre-procedure details:    Preparation:  Patient was prepped and draped in usual sterile fashion Exploration:    Limited defect created (wound extended): no   Skin repair:    Repair method:  Tissue adhesive Approximation:    Approximation:  Close Repair type:    Repair type:  Simple Post-procedure details:    Dressing:  Open (no dressing) .Critical  Care  Date/Time: 12/29/2020 1:12 AM Performed by: Gareth Morgan, MD Authorized by: Gareth Morgan, MD   Critical care provider statement:    Critical care time (minutes):  45   Critical care was time spent personally by me on the following activities:  Evaluation of patient's response to treatment, examination of patient, ordering and performing treatments and interventions, ordering and review of laboratory studies, ordering and review of radiographic studies, pulse oximetry, re-evaluation of patient's condition, obtaining history from patient or surrogate and review of old charts   Medications Ordered in ED Medications  0.9 %  sodium chloride infusion (75 mL/hr Intravenous New Bag/Given 12/28/20 2241)  acetaminophen (TYLENOL) tablet 650 mg (has no administration in time range)    Or  acetaminophen (TYLENOL) suppository 650 mg (has no administration in time range)  HYDROcodone-acetaminophen (NORCO/VICODIN) 5-325 MG per tablet 1-2 tablet (has no administration in time range)  albuterol (PROVENTIL) (2.5 MG/3ML) 0.083% nebulizer solution 2.5 mg (has no administration in time range)  cefTRIAXone (ROCEPHIN) 1 g in sodium chloride 0.9 % 100 mL IVPB (has no administration in time range)  traMADol (ULTRAM) tablet 50 mg (has no administration in time range)  methocarbamol (ROBAXIN) tablet 500 mg (500 mg Oral Given 12/29/20 0000)  lactated ringers bolus 1,000 mL (0 mLs Intravenous Stopped 12/28/20 1821)  cefTRIAXone (ROCEPHIN) 1 g in sodium chloride 0.9 % 100 mL IVPB (0 g Intravenous Stopped 12/28/20 2053)    ED Course  I have reviewed the triage vital signs and the nursing notes.  Pertinent labs & imaging results that were available during my care of the patient were reviewed by me and considered in my medical decision making (see chart for details).    MDM Rules/Calculators/A&P                            80 year old female with a history of atrial fibrillation on Xarelto, hypertension  with recent increase in her losartan a few weeks ago, presents as fall on anticoagulation.  Arrives as a level 2 trauma given fall on anticoagulation.  She arrives hypotensive, but denies any other areas of trauma and suspect that her hypotension is secondary to a medical cause, so she was not upgraded to a level 1.  Chest x-ray and pelvis x-ray were completed which showed no evidence of traumatic findings, no signs of pneumonia, pneumothorax, or other out allergies.  CT head, maxillofacial and cervical spine showed nasal bone fractures without other abnormalities.  On arrival, blood pressures in the 70s, without tachycardia.  By history, do not suspect infectious etiology, and will blood cultures were ordered did not order empiric antibiotics.  By history, suspect likely increase in blood pressure medication and dehydration as etiology of hypotension.  She does not have symptoms to suggest spinal shock, pulmonary embolus, aortic dissection, ruptured AAA, sepsis, GI bleed.  Labs do not show signs of hypothyroidism.  Labs do show INR elevation.    Given IV fluid with improvement.  Will admit for continued hydration in the setting of severe hypotension, possible syncopal episode.  BP improving following hydration. Urinalysis returned showing concern for possible urinary tract infection.  Ordered Rocephin for treatment.  We will have to do Keflex are also appropriate for her laceration overlying her nasal bone fractures   Final Clinical Impression(s) / ED Diagnoses Final diagnoses:  Trauma  Fall, initial encounter  Open fracture of nasal bone, initial encounter  Hypotension, unspecified hypotension type    Rx / DC Orders ED Discharge Orders     None        Gareth Morgan, MD 12/29/20 [id number]    Gareth Morgan, MD 12/29/20 [phone]

## 2020-12-28 NOTE — ED Triage Notes (Addendum)
Pt walking on back patio, pt says suddenly bright flash of light into eyes, fell forward onto concrete. Denies neck or back pain. Pt with hematoma to forehead. Initial 72/30. 78 palpated on arrival. Given 250cc NS en route. Pt on xarelto. Hx afib. Pt alert and oriented X4.

## 2020-12-28 NOTE — Progress Notes (Signed)
..  Trauma Response Nurse Note-  Reason for Call / Reason for Trauma activation:  Level 2 = fall on thinners, hyptoensive  Initial Focused Assessment (If applicable, or please see trauma documentation):  On arrival pt is alert/oriented x 4, states she saw a flash of light and that is all she remembers- small laceration to bridge of nose. MAE x 4- denies any other pain. Dr. Billy Fischer at bedside on arrival  Interventions:  Hypotensive--pt recently doubled BP meds. IV per EMS in left [facility]- Right arm restricted due to breast cancer/mastectomy.  Labs Xrays CT scans  Plan of Care as of this note:  IV bolus x 2 Waiting lab/xray/ct results  -The Following (if applicable):    -MD notified: at bedside on arrival    -Time of Page/Time of notification: 1617    -TRN arrival Time: 1617 (I was laready in the ED) Please contact TRN as needed  Rolene Arbour, RN Trauma Response Nurse [phone]

## 2020-12-28 NOTE — ED Notes (Signed)
Attempted to contact Pharmacy Tech per Dr. Roel Cluck request. Informed no pharmacy tech available in the ED tonight. This RN requested pt to obtain medication list from family, since she is unable to recall medications she takes. No answer from family. Dr. Roel Cluck informed medication list is not available.

## 2020-12-28 NOTE — ED Notes (Addendum)
Pt complaining of feeling uncomfortable and increasingly sore. Recommended tylenol PRN ordered. Pt states she doesn't like tylenol. Secure message sent to admitting provider requesting alterate PRN medications. Hospital bed has been ordered for pt comfort while she waits for room.

## 2020-12-29 ENCOUNTER — Encounter: Payer: Self-pay | Admitting: Internal Medicine

## 2020-12-29 ENCOUNTER — Observation Stay (HOSPITAL_BASED_OUTPATIENT_CLINIC_OR_DEPARTMENT_OTHER): Payer: Medicare Other

## 2020-12-29 ENCOUNTER — Observation Stay (HOSPITAL_COMMUNITY): Payer: Medicare Other

## 2020-12-29 ENCOUNTER — Encounter (HOSPITAL_COMMUNITY): Payer: Self-pay | Admitting: Internal Medicine

## 2020-12-29 ENCOUNTER — Telehealth: Payer: Self-pay | Admitting: Surgery

## 2020-12-29 DIAGNOSIS — J45901 Unspecified asthma with (acute) exacerbation: Secondary | ICD-10-CM | POA: Diagnosis present

## 2020-12-29 DIAGNOSIS — I4891 Unspecified atrial fibrillation: Secondary | ICD-10-CM | POA: Insufficient documentation

## 2020-12-29 DIAGNOSIS — F411 Generalized anxiety disorder: Secondary | ICD-10-CM | POA: Diagnosis present

## 2020-12-29 DIAGNOSIS — E872 Acidosis, unspecified: Secondary | ICD-10-CM | POA: Diagnosis present

## 2020-12-29 DIAGNOSIS — R55 Syncope and collapse: Secondary | ICD-10-CM

## 2020-12-29 DIAGNOSIS — K219 Gastro-esophageal reflux disease without esophagitis: Secondary | ICD-10-CM | POA: Diagnosis present

## 2020-12-29 DIAGNOSIS — E871 Hypo-osmolality and hyponatremia: Secondary | ICD-10-CM

## 2020-12-29 DIAGNOSIS — Z853 Personal history of malignant neoplasm of breast: Secondary | ICD-10-CM

## 2020-12-29 DIAGNOSIS — N390 Urinary tract infection, site not specified: Secondary | ICD-10-CM | POA: Diagnosis present

## 2020-12-29 DIAGNOSIS — I952 Hypotension due to drugs: Secondary | ICD-10-CM

## 2020-12-29 DIAGNOSIS — I9589 Other hypotension: Secondary | ICD-10-CM

## 2020-12-29 DIAGNOSIS — E861 Hypovolemia: Secondary | ICD-10-CM

## 2020-12-29 LAB — COMPREHENSIVE METABOLIC PANEL
ALT: 33 U/L (ref 0–44)
AST: 57 U/L — ABNORMAL HIGH (ref 15–41)
Albumin: 3.1 g/dL — ABNORMAL LOW (ref 3.5–5.0)
Alkaline Phosphatase: 96 U/L (ref 38–126)
Anion gap: 10 (ref 5–15)
BUN: 9 mg/dL (ref 8–23)
CO2: 26 mmol/L (ref 22–32)
Calcium: 9.3 mg/dL (ref 8.9–10.3)
Chloride: 101 mmol/L (ref 98–111)
Creatinine, Ser: 0.73 mg/dL (ref 0.44–1.00)
GFR, Estimated: >60 mL/min (ref >60)
Glucose, Bld: 91 mg/dL (ref 70–99)
Potassium: 4.2 mmol/L (ref 3.5–5.1)
Sodium: 137 mmol/L (ref 135–145)
Total Bilirubin: 1.2 mg/dL (ref 0.3–1.2)
Total Protein: 6.1 g/dL — ABNORMAL LOW (ref 6.5–8.1)

## 2020-12-29 LAB — CBC WITH DIFFERENTIAL/PLATELET
Abs Immature Granulocytes: 0.02 10*3/uL (ref 0.00–0.07)
Basophils Absolute: 0 10*3/uL (ref 0.0–0.1)
Basophils Relative: 1 %
Eosinophils Absolute: 0.1 10*3/uL (ref 0.0–0.5)
Eosinophils Relative: 2 %
HCT: 36.8 % (ref 36.0–46.0)
Hemoglobin: 12.2 g/dL (ref 12.0–15.0)
Immature Granulocytes: 0 %
Lymphocytes Relative: 19 %
Lymphs Abs: 1.1 10*3/uL (ref 0.7–4.0)
MCH: 35.9 pg — ABNORMAL HIGH (ref 26.0–34.0)
MCHC: 33.2 g/dL (ref 30.0–36.0)
MCV: 108.2 fL — ABNORMAL HIGH (ref 80.0–100.0)
Monocytes Absolute: 0.9 10*3/uL (ref 0.1–1.0)
Monocytes Relative: 16 %
Neutro Abs: 3.6 10*3/uL (ref 1.7–7.7)
Neutrophils Relative %: 62 %
Platelets: 165 10*3/uL (ref 150–400)
RBC: 3.4 MIL/uL — ABNORMAL LOW (ref 3.87–5.11)
RDW: 13 % (ref 11.5–15.5)
WBC: 5.8 10*3/uL (ref 4.0–10.5)
nRBC: 0 % (ref 0.0–0.2)

## 2020-12-29 LAB — ECHOCARDIOGRAM COMPLETE
Area-P 1/2: 3.6 cm2
Height: 60 in
P 1/2 time: 609 msec
S' Lateral: 3.2 cm
Weight: 2304 oz

## 2020-12-29 LAB — LACTIC ACID, PLASMA
Lactic Acid, Venous: 1 mmol/L (ref 0.5–1.9)
Lactic Acid, Venous: 0.9 mmol/L (ref 0.5–1.9)

## 2020-12-29 LAB — MAGNESIUM: Magnesium: 2.3 mg/dL (ref 1.7–2.4)

## 2020-12-29 LAB — PHOSPHORUS: Phosphorus: 3.8 mg/dL (ref 2.5–4.6)

## 2020-12-29 LAB — ABO/RH: ABO/RH(D): O POS

## 2020-12-29 LAB — TSH: TSH: 2.364 u[IU]/mL (ref 0.350–4.500)

## 2020-12-29 LAB — OSMOLALITY: Osmolality: 287 mOsm/kg (ref 275–295)

## 2020-12-29 MED ORDER — TRAMADOL HCL 50 MG PO TABS: 50.0000 mg | ORAL_TABLET | Freq: Four times a day (QID) | ORAL | 0 refills | Status: AC | PRN

## 2020-12-29 MED ORDER — LOSARTAN POTASSIUM 50 MG PO TABS: 50.0000 mg | ORAL_TABLET | Freq: Every day | ORAL | Status: DC

## 2020-12-29 MED ORDER — FLUTICASONE FUROATE-VILANTEROL 100-25 MCG/INH IN AEPB: 1.0000 | INHALATION_SPRAY | Freq: Every day | RESPIRATORY_TRACT | Status: DC

## 2020-12-29 MED ORDER — VENLAFAXINE HCL ER 75 MG PO CP24
75.0000 mg | ORAL_CAPSULE | Freq: Every day | ORAL | Status: DC
  Administered 2020-12-29: 75 mg via ORAL
  Filled 2020-12-29 (×2): qty 1

## 2020-12-29 MED ORDER — MAGNESIUM SULFATE IN D5W 1-5 GM/100ML-% IV SOLN
1.0000 g | Freq: Once | INTRAVENOUS | Status: AC
  Administered 2020-12-29: 1 g via INTRAVENOUS
  Filled 2020-12-29: qty 100

## 2020-12-29 MED ORDER — CEPHALEXIN 500 MG PO CAPS: 500.0000 mg | ORAL_CAPSULE | Freq: Four times a day (QID) | ORAL | 0 refills | Status: AC

## 2020-12-29 MED ORDER — ALBUTEROL SULFATE HFA 108 (90 BASE) MCG/ACT IN AERS: 1.0000 | INHALATION_SPRAY | Freq: Four times a day (QID) | RESPIRATORY_TRACT | Status: DC | PRN

## 2020-12-29 MED ORDER — FAMOTIDINE 20 MG PO TABS
20.0000 mg | ORAL_TABLET | Freq: Every day | ORAL | Status: DC
  Administered 2020-12-29: 20 mg via ORAL
  Filled 2020-12-29: qty 1

## 2020-12-29 MED ORDER — PROPRANOLOL HCL ER 80 MG PO CP24
80.0000 mg | ORAL_CAPSULE | Freq: Every day | ORAL | Status: DC
  Administered 2020-12-29: 80 mg via ORAL
  Filled 2020-12-29: qty 1

## 2020-12-29 MED ORDER — SODIUM CHLORIDE 0.9 % IV SOLN
1.0000 g | Freq: Once | INTRAVENOUS | Status: AC
  Administered 2020-12-29: 1 g via INTRAVENOUS
  Filled 2020-12-29: qty 10

## 2020-12-29 MED ORDER — FLUTICASONE FUROATE-VILANTEROL 100-25 MCG/INH IN AEPB
1.0000 | INHALATION_SPRAY | Freq: Every day | RESPIRATORY_TRACT | Status: DC
  Filled 2020-12-29: qty 28

## 2020-12-29 MED ORDER — ALPRAZOLAM 0.25 MG PO TABS
0.5000 mg | ORAL_TABLET | Freq: Every day | ORAL | Status: DC | PRN
  Administered 2020-12-29: 0.5 mg via ORAL
  Filled 2020-12-29: qty 2

## 2020-12-29 MED ORDER — LETROZOLE 2.5 MG PO TABS
2.5000 mg | ORAL_TABLET | Freq: Every day | ORAL | Status: DC
  Administered 2020-12-29: 2.5 mg via ORAL
  Filled 2020-12-29: qty 1

## 2020-12-29 NOTE — Progress Notes (Signed)
Carotid duplex bilateral study completed.   Please see CV Proc for preliminary results.   Wlliam Grosso, RDMS, RVT  

## 2020-12-29 NOTE — ED Notes (Signed)
Patient transported to Vascular 

## 2020-12-29 NOTE — Progress Notes (Signed)
  Echocardiogram 2D Echocardiogram has been performed.  [name] G [name] 12/29/2020, 2:30 PM

## 2020-12-29 NOTE — ED Notes (Signed)
Inpt discharge papers printed and discussed with pt. Pt reports concerns and request to speak with MD. MD paged and RN requested MD to address pts concerns.

## 2020-12-29 NOTE — ED Notes (Addendum)
Pt was transitioned to hospital bed, pillows provided, and PRN robaxin given.

## 2020-12-29 NOTE — Progress Notes (Signed)
Physical Therapy Evaluation Patient Details Name: [name] MRN: [id number] DOB: 1940/10/13 Today's Date: 12/29/2020   History of Present Illness  Pt is a 80yo female presenting to [facility] ED for possible syncopal episode on 7/24. CT shows nasal fractures and scalp hematoma. Possible UTI. PMH: breast cancer, asthma, a fib, HTN, anxiety.  Clinical Impression  Pt presents with the problems listed above and the impairments below. Pt required min guard for bed mobility and min guard to min assist for transfers and gait. Practiced various balance activities and noted LOB with backward walking. Pt's BP was mildly elevated during session and RN notified. Of note, pt also complaining of right shoulder pain; RN notified. PT is recommending outpatient physical therapy at this time to assist with higher level balance. We will continue to follow acutely to promote independence with functional mobility.    Follow Up Recommendations Outpatient PT    Equipment Recommendations  None recommended by PT    Recommendations for Other Services       Precautions / Restrictions Precautions Precautions: Fall Precaution Comments: Pt reported a fall in 07/2020 after tripping over a throw rug Restrictions Weight Bearing Restrictions: No      Mobility  Bed Mobility Overal bed mobility: Needs Assistance Bed Mobility: Supine to Sit;Sit to Supine     Supine to sit: Min guard Sit to supine: Min guard   General bed mobility comments: Increased time, verbal cues for hand placement. Min guard for safety    Transfers Overall transfer level: Needs assistance Equipment used: 1 person hand held assist Transfers: Sit to/from Stand Sit to Stand: Min guard;+2 safety/equipment         General transfer comment: min guard assist for safety. Required assist for lines and leads management  Ambulation/Gait Ambulation/Gait assistance: Min assist;+2 safety/equipment;Min guard Gait Distance (Feet): 10  Feet Assistive device: 2 person hand held assist Gait Pattern/deviations: Step-through pattern;Decreased step length - right;Decreased step length - left Gait velocity: decreased   General Gait Details: Took forwards/backwards steps and side steps at EOB. Pt reports feeling "off" during mobility and had mild LOB with backwards walking. Required min A for steadying during LOB. Otherwise required min guard A for safety. Educated about use of rollator initially to increase safety.  Stairs            Wheelchair Mobility    Modified Rankin (Stroke Patients Only)       Balance Overall balance assessment: Needs assistance Sitting-balance support: Feet supported Sitting balance-Leahy Scale: Fair     Standing balance support: No upper extremity supported;Bilateral upper extremity supported Standing balance-Leahy Scale: Fair Standing balance comment: Able to maintain static standing without UE support         Rhomberg - Eyes Opened: 10 (Approximately 10 sec; Pt demonstrated slight unsteadiness without loss of balance and required no UE support)   High level balance activites: Side stepping;Backward walking High Level Balance Comments: Pt demonstrated slight unsteadiness on feet during backwards walking             Pertinent Vitals/Pain Pain Assessment: 0-10 Pain Score: 8  Pain Location: head Pain Descriptors / Indicators: Headache Pain Intervention(s): Monitored during session;Patient requesting pain meds-RN notified    Home Living Family/patient expects to be discharged to:: Private residence Living Arrangements: Spouse/significant other Available Help at Discharge: Family;Available 24 hours/day Type of Home: House Home Access: Stairs to enter Entrance Stairs-Rails: None (Pt reports need to use hand-held-assist on bench and trellis beside stair to ascend into doorway)  Entrance Stairs-Number of Steps: 1 (front stairs 6 steps; back steps 1 (main route of entry)) Home  Layout: One level Home Equipment: Grab bars - tub/shower;Cane - single point;Walker - 4 wheels      Prior Function Level of Independence: Independent         Comments: Pt reports not utilizing ADs prior to ED admission     Hand Dominance   Dominant Hand: Right    Extremity/Trunk Assessment   Upper Extremity Assessment Upper Extremity Assessment: Defer to OT evaluation;RUE deficits/detail RUE Deficits / Details: Pt reported RUE pain and soreness with abduction past 90deg localized to posterior deltoid; upon palpation found to be tender with muscular gaurding. No visual deformities noted in RUE    Lower Extremity Assessment Lower Extremity Assessment: Generalized weakness    Cervical / Trunk Assessment Cervical / Trunk Assessment: Kyphotic  Communication   Communication: No difficulties  Cognition Arousal/Alertness: Awake/alert Behavior During Therapy: WFL for tasks assessed/performed Overall Cognitive Status: Within Functional Limits for tasks assessed                                        General Comments      Exercises     Assessment/Plan    PT Assessment Patient needs continued PT services  PT Problem List Decreased activity tolerance;Decreased balance;Decreased strength;Decreased range of motion;Decreased mobility;Decreased coordination;Decreased safety awareness       PT Treatment Interventions DME instruction;Stair training;Gait training;Functional mobility training;Therapeutic activities;Therapeutic exercise;Balance training;Patient/family education    PT Goals (Current goals can be found in the Care Plan section)  Acute Rehab PT Goals Patient Stated Goal: To go home PT Goal Formulation: With patient Time For Goal Achievement: 01/12/21 Potential to Achieve Goals: Good    Frequency Min 3X/week   Barriers to discharge        Co-evaluation               AM-PAC PT "6 Clicks" Mobility  Outcome Measure Help needed turning  from your back to your side while in a flat bed without using bedrails?: None Help needed moving from lying on your back to sitting on the side of a flat bed without using bedrails?: A Little Help needed moving to and from a bed to a chair (including a wheelchair)?: A Little Help needed standing up from a chair using your arms (e.g., wheelchair or bedside chair)?: A Little Help needed to walk in hospital room?: A Little Help needed climbing 3-5 steps with a railing? : A Lot 6 Click Score: 18    End of Session Equipment Utilized During Treatment: Gait belt Activity Tolerance: Patient tolerated treatment well Patient left: in bed;with call bell/phone within reach Nurse Communication: Mobility status;Patient requests pain meds;Other (comment) (notified nurse of pt's report of RUE pain) PT Visit Diagnosis: Unsteadiness on feet (R26.81);History of falling (Z91.81);Muscle weakness (generalized) (M62.81)    Time: [id number] PT Time Calculation (min) (ACUTE ONLY): 27 min   Charges:   PT Evaluation $PT Eval Low Complexity: 1 Low PT Treatments $Therapeutic Activity: 8-22 mins        [name], SPT    [name] 12/29/2020, 12:59 PM

## 2020-12-29 NOTE — Care Management (Signed)
ED CM received call from [facility] concerning patient being discharge home from ED by Hospitalist, requesting OP PT, ED RNCM sent the referral  to  [place] will place information on AVS and contact patient with the details.

## 2020-12-29 NOTE — Discharge Instructions (Addendum)
[name],  You were in the hospital because of passing out and suffering a facial fracture. This appears to have happened because of low blood pressure possibly due to a combination of blood pressure medication and dehydration. You have significantly improved. I discussed your fracture with the ENT physician on call who recommended that you wait 5-7 days, and if your nose does not look like it did normally, to schedule an appointment with an ENT physician for consideration of repair. Please try not to blow your nose while it heals. I am recommending that you reduce your losartan to once daily until you follow-up with your cardiologist. As recommended by the ED physician, I will discharge you with a short course of antibiotics.  Cordelia Poche, MD [facility] 12/29/2020, 5:18 PM

## 2020-12-29 NOTE — ED Notes (Signed)
Patient Transported to Xray.

## 2020-12-29 NOTE — Discharge Summary (Signed)
Physician Discharge Summary  [name] [id number] DOB: 09/13/1940 DOA: 12/28/2020  PCP: Lawerance Cruel, MD  Admit date: 12/28/2020 Discharge date: 12/29/2020  Admitted From: Home Disposition: Home  Recommendations for Outpatient Follow-up:  Follow up with PCP in 1 week Follow up with ENT if after 5-7 days her nose does not heal correctly Please follow up on the following pending results: None  Home Health: Outpatient PT Equipment/Devices: None  Discharge Condition: Stable CODE STATUS: Full code Diet recommendation: Heart healthy   Brief/Interim Summary:  Admission HPI written by Toy Baker, MD   HPI: [name] is a 80 y.o. female with medical history significant of  A.fib on Xarelto, breast cancer on po chemo     Presented with  fall had a bright flush of light She was eating lunch  had to climb stairs to get to the restaurant, Had [place] there on empty stomach ate lunch Husband drove them home she got out of the car and saw bright light next thing she knew she was falling forward  episode fell forward and hit concreate with her head Initial bp 72/30 Her Losartan was bumped up last week   No CP, no SOB , no Diarrhea Had minimal discharge no dysuria Does not smoke or drink on regular basis    Hospital course:  Syncope Likely secondary to hypotension from a combination of hypovolemia and antihypertensive usage. Carotid ultrasound unremarkable. Transthoracic Echocardiogram without AV or LV pathology consistent with history of syncope. Telemetry without any ventricular arrhythmia. Patient's blood pressure significantly improved during hospitalization. PT/OT consulted and recommended outpatient therapy. Patient's losartan decreased back to previous daily dosing, down from BID dosing.  Hypotension Multifactorial from hypovolemia and iatrogenic. Patient's antihypertensives held and patient given IV fluid with resolution of hypotension.    Nasal bone fracture Secondary to fall. Discussed with ENT who recommended patient to call for follow-up with ENT in 5-7 days if nose does not heal normally  Hyponatremia Mild. Resolved with IV fluids  Primary hypertension Patient is on Propranolol, losartan as an outpatient. Losartan decreased from BID to daily dosing.  Atrial fibrillation Rate controlled. Continue propranolol  GERD Continue Pepcid  GAD Continue Xanax  UTI Symptoms of dysuria. No urine culture obtained. Urinalysis could suggest infection. Patient treated empirically with Ceftriaxone and discharged on Keflex  Discharge Diagnoses:  Principal Problem:   Syncope Active Problems:   Hypotension   Hyponatremia   A-fib (HCC)   Asthma, chronic, unspecified asthma severity, with acute exacerbation   GAD (generalized anxiety disorder)   History of breast cancer   GERD (gastroesophageal reflux disease)   Acute lower UTI   Lactic acid increased    Discharge Instructions   Allergies as of 12/29/2020       Reactions   Codeine Nausea Only, Other (See Comments)   Pt can tolerate when pre-medicated   Doxycycline Nausea And Vomiting   Nsaids Other (See Comments)   Gi bleed   Chlorhexidine Swelling, Rash   Tylenol [acetaminophen] Other (See Comments)   GI bleed        Medication List     TAKE these medications    albuterol 108 (90 Base) MCG/ACT inhaler Commonly known as: VENTOLIN HFA Inhale 1 puff into the lungs every 6 (six) hours as needed for wheezing or shortness of breath.   ALPRAZolam 0.5 MG tablet Commonly known as: XANAX Take 0.5 mg by mouth daily as needed for anxiety.   cephALEXin 500 MG capsule Commonly known  as: KEFLEX Take 1 capsule (500 mg total) by mouth 4 (four) times daily for 3 days. Start taking on: December 30, 2020   COLLAGEN PO Take 1 capsule by mouth daily.   diclofenac Sodium 1 % Gel Commonly known as: VOLTAREN Apply 2 g topically 4 (four) times daily as needed (joint  pain).   EPINEPHrine 0.3 mg/0.3 mL Soaj injection Commonly known as: EPI-PEN Inject 0.3 mg into the muscle as needed for anaphylaxis.   famotidine 20 MG tablet Commonly known as: PEPCID Take 20 mg by mouth See admin instructions. Take '20mg'$  by mouth once daily, may take an additional '20mg'$  as needed for acid reflux   fluorouracil 5 % cream Commonly known as: EFUDEX Apply 1 application topically as directed. Per MD instructions, for skin cancer   fluticasone 50 MCG/ACT nasal spray Commonly known as: FLONASE Place 2 sprays into both nostrils daily.   fluticasone furoate-vilanterol 100-25 MCG/INH Aepb Commonly known as: BREO ELLIPTA Inhale 1 puff into the lungs daily.   furosemide 20 MG tablet Commonly known as: LASIX Take 20 mg by mouth daily.   letrozole 2.5 MG tablet Commonly known as: FEMARA Take 2.5 mg by mouth at bedtime.   levocetirizine 5 MG tablet Commonly known as: XYZAL Take 5 mg by mouth at bedtime.   losartan 50 MG tablet Commonly known as: COZAAR Take 1 tablet (50 mg total) by mouth daily. What changed: when to take this   MILK THISTLE PO Take 1 capsule by mouth daily.   MSM PO Take 1 capsule by mouth daily.   OMEGA 3 PO Take 1 capsule by mouth daily.   OVER THE COUNTER MEDICATION Take 1 capsule by mouth daily. Immunity supplement   potassium chloride 20 MEQ packet Commonly known as: KLOR-CON Take 20 mEq by mouth daily.   propranolol ER 80 MG 24 hr capsule Commonly known as: INDERAL LA Take 80 mg by mouth daily.   PYCNOGENOL COMPLEX PO Take 1 capsule by mouth daily.   traMADol 50 MG tablet Commonly known as: ULTRAM Take 1 tablet (50 mg total) by mouth every 6 (six) hours as needed for up to 3 days for moderate pain.   TURMERIC PO Take 1 capsule by mouth daily.   venlafaxine XR 75 MG 24 hr capsule Commonly known as: EFFEXOR-XR Take 75 mg by mouth daily with breakfast.   VITAMIN B COMPLEX PO Take 1 capsule by mouth daily.   VITAMIN D  PO Take 1 capsule by mouth daily.   Xarelto 20 MG Tabs tablet Generic drug: rivaroxaban Take 20 mg by mouth daily.        Follow-up Information     Lawerance Cruel, MD. Schedule an appointment as soon as possible for a visit in 1 week(s).   Specialty: Family Medicine Why: For hospital follow-up Contact information: [place] [address] [phone]         Jerrell Belfast, MD. Call in 7 day(s).   Specialty: Otolaryngology Why: if after healing, your nose does not look normal to inquire about having it fixed Contact information: [address] [place] [address] [phone]                Allergies  Allergen Reactions   Codeine Nausea Only and Other (See Comments)    Pt can tolerate when pre-medicated   Doxycycline Nausea And Vomiting   Nsaids Other (See Comments)    Gi bleed   Chlorhexidine Swelling and Rash  Tylenol [Acetaminophen] Other (See Comments)    GI bleed    Consultations: ENT (telephone)   Procedures/Studies: CT HEAD WO CONTRAST  Result Date: 12/28/2020 CLINICAL DATA:  Pt walking on back patio, pt says suddenly bright flash of light into eyes, fell forward onto concrete. Denies neck or back pain. Pt with hematoma to forehead. EXAM: CT HEAD WITHOUT CONTRAST CT MAXILLOFACIAL WITHOUT CONTRAST CT CERVICAL SPINE WITHOUT CONTRAST TECHNIQUE: Multidetector CT imaging of the head, cervical spine, and maxillofacial structures were performed using the standard protocol without intravenous contrast. Multiplanar CT image reconstructions of the cervical spine and maxillofacial structures were also generated. COMPARISON:  Head CT, 08/18/2020 FINDINGS: CT HEAD FINDINGS Brain: No evidence of acute infarction, hemorrhage, hydrocephalus, extra-axial collection or mass lesion/mass effect. Vascular: No hyperdense vessel or unexpected calcification. Skull: Normal. Negative for fracture or focal lesion. Other: Low forehead  scalp contusion. CT MAXILLOFACIAL FINDINGS Osseous: Mildly comminuted fractures of the nasal bone, minimally depressed, 1-2 mm. No other fractures.  No bone lesions. Orbits: Negative. No traumatic or inflammatory finding. Sinuses: Chronic near complete opacification of the right maxillary sinus. Opacified posterior left ethmoid air cells. Mostly opacified hypoplastic sphenoid sinuses. These findings are stable from head CT dated 08/18/2020. Remaining sinuses are clear. Clear mastoid air cells and middle ear cavities. Soft tissues: Mild right low central forehead scalp contusion. Mild nasal soft tissue swelling suggested. CT CERVICAL SPINE FINDINGS Alignment: Slight reversal of the normal cervical lordosis, apex at C4-C5. No spondylolisthesis. Skull base and vertebrae: No acute fracture. No primary bone lesion or focal pathologic process. Soft tissues and spinal canal: No prevertebral fluid or swelling. No visible canal hematoma. Disc levels: Mild loss of disc height at C2-C3. Moderate to marked loss of disc height at C3-C4. Marked loss of disc height at C4-C5, C5-C6 and C6-C7. Mild spondylotic disc bulging and endplate spurring noted throughout these levels. There are facet degenerative changes greatest at C2-C3 and C3-C4. No convincing disc herniation. Upper chest: No acute findings. Focal opacity at the right apex is consistent with scarring. Other: None. IMPRESSION: HEAD CT 1. No acute intracranial abnormalities. 2. Small low forehead scalp hematoma.  No skull fracture. MAXILLOFACIAL CT 1. Mildly comminuted, minimally depressed, nasal fractures. 2. No other fractures. CERVICAL CT 1. No fracture or acute finding. Electronically Signed   By: Lajean Manes M.D.   On: 12/28/2020 18:08   CT CERVICAL SPINE WO CONTRAST  Result Date: 12/28/2020 CLINICAL DATA:  Pt walking on back patio, pt says suddenly bright flash of light into eyes, fell forward onto concrete. Denies neck or back pain. Pt with hematoma to forehead.  EXAM: CT HEAD WITHOUT CONTRAST CT MAXILLOFACIAL WITHOUT CONTRAST CT CERVICAL SPINE WITHOUT CONTRAST TECHNIQUE: Multidetector CT imaging of the head, cervical spine, and maxillofacial structures were performed using the standard protocol without intravenous contrast. Multiplanar CT image reconstructions of the cervical spine and maxillofacial structures were also generated. COMPARISON:  Head CT, 08/18/2020 FINDINGS: CT HEAD FINDINGS Brain: No evidence of acute infarction, hemorrhage, hydrocephalus, extra-axial collection or mass lesion/mass effect. Vascular: No hyperdense vessel or unexpected calcification. Skull: Normal. Negative for fracture or focal lesion. Other: Low forehead scalp contusion. CT MAXILLOFACIAL FINDINGS Osseous: Mildly comminuted fractures of the nasal bone, minimally depressed, 1-2 mm. No other fractures.  No bone lesions. Orbits: Negative. No traumatic or inflammatory finding. Sinuses: Chronic near complete opacification of the right maxillary sinus. Opacified posterior left ethmoid air cells. Mostly opacified hypoplastic sphenoid sinuses. These findings are stable from head CT dated 08/18/2020.  Remaining sinuses are clear. Clear mastoid air cells and middle ear cavities. Soft tissues: Mild right low central forehead scalp contusion. Mild nasal soft tissue swelling suggested. CT CERVICAL SPINE FINDINGS Alignment: Slight reversal of the normal cervical lordosis, apex at C4-C5. No spondylolisthesis. Skull base and vertebrae: No acute fracture. No primary bone lesion or focal pathologic process. Soft tissues and spinal canal: No prevertebral fluid or swelling. No visible canal hematoma. Disc levels: Mild loss of disc height at C2-C3. Moderate to marked loss of disc height at C3-C4. Marked loss of disc height at C4-C5, C5-C6 and C6-C7. Mild spondylotic disc bulging and endplate spurring noted throughout these levels. There are facet degenerative changes greatest at C2-C3 and C3-C4. No convincing disc  herniation. Upper chest: No acute findings. Focal opacity at the right apex is consistent with scarring. Other: None. IMPRESSION: HEAD CT 1. No acute intracranial abnormalities. 2. Small low forehead scalp hematoma.  No skull fracture. MAXILLOFACIAL CT 1. Mildly comminuted, minimally depressed, nasal fractures. 2. No other fractures. CERVICAL CT 1. No fracture or acute finding. Electronically Signed   By: Lajean Manes M.D.   On: 12/28/2020 18:08   DG Pelvis Portable  Result Date: 12/28/2020 CLINICAL DATA:  Fall. EXAM: PORTABLE PELVIS 1-2 VIEWS COMPARISON:  February 20, 2010. FINDINGS: There is no evidence of pelvic fracture or diastasis. No pelvic bone lesions are seen. IMPRESSION: Negative. Electronically Signed   By: Marijo Conception M.D.   On: 12/28/2020 16:54   DG Chest Port 1 View  Result Date: 12/28/2020 CLINICAL DATA:  Fall. EXAM: PORTABLE CHEST 1 VIEW COMPARISON:  January 29, 2017. FINDINGS: The heart size and mediastinal contours are within normal limits. Both lungs are clear. The visualized skeletal structures are unremarkable. IMPRESSION: No active disease. Electronically Signed   By: Marijo Conception M.D.   On: 12/28/2020 16:53   DG Humerus Right  Result Date: 12/29/2020 CLINICAL DATA:  Fall. Fell yesterday on concrete and complaints of upper arm pain. EXAM: RIGHT HUMERUS - 2+ VIEW COMPARISON:  02/20/2010 FINDINGS: Right humerus is intact. Negative for fracture. Mild degenerative changes at the right [facility] joint. Elbow and shoulder appear to be located. IMPRESSION: No acute bone abnormality to the right humerus. Electronically Signed   By: Markus Daft M.D.   On: 12/29/2020 13:38   VAS US CAROTID  Result Date: 12/29/2020 Carotid Arterial Duplex Study Patient Name:  [name]  Date of Exam:   12/29/2020 Medical Rec #: [id number]        Accession #:    [id number] Date of Birth: [birth year]        Patient Gender: F Patient Age:   79Y Exam Location:  [facility] Procedure:      VAS US  CAROTID Referring Phys: [id number] ANASTASSIA DOUTOVA --------------------------------------------------------------------------------  Indications:       Syncope. Other Factors:     A-fib, hypotension. Comparison Study:  No prior studies. Performing Technologist: Darlin Coco RDMS,RVT  Examination Guidelines: A complete evaluation includes B-mode imaging, spectral Doppler, color Doppler, and power Doppler as needed of all accessible portions of each vessel. Bilateral testing is considered an integral part of a complete examination. Limited examinations for reoccurring indications may be performed as noted.  Right Carotid Findings: +----------+--------+--------+--------+--------------------------+--------+           PSV cm/sEDV cm/sStenosisPlaque Description        Comments +----------+--------+--------+--------+--------------------------+--------+ CCA Prox  70      14                                                 +----------+--------+--------+--------+--------------------------+--------+  CCA Distal68      20                                                 +----------+--------+--------+--------+--------------------------+--------+ ICA Prox  56      15      1-39%   calcific, focal and smooth         +----------+--------+--------+--------+--------------------------+--------+ ICA Distal49      14                                                 +----------+--------+--------+--------+--------------------------+--------+ ECA       63      7                                                  +----------+--------+--------+--------+--------------------------+--------+ +----------+--------+-------+----------------+-------------------+           PSV cm/sEDV cmsDescribe        Arm Pressure (mmHG) +----------+--------+-------+----------------+-------------------+ [id number]             Multiphasic, WNL                     +----------+--------+-------+----------------+-------------------+ +---------+--------+--+--------+--+---------+ VertebralPSV cm/s45EDV cm/s12Antegrade +---------+--------+--+--------+--+---------+  Left Carotid Findings: +----------+--------+--------+--------+------------------+------------------+           PSV cm/sEDV cm/sStenosisPlaque DescriptionComments           +----------+--------+--------+--------+------------------+------------------+ CCA Prox  67      16                                                   +----------+--------+--------+--------+------------------+------------------+ CCA Distal68      11                                                   +----------+--------+--------+--------+------------------+------------------+ ICA Prox  45      18      1-39%                     intimal thickening +----------+--------+--------+--------+------------------+------------------+ ICA Distal55      23                                                   +----------+--------+--------+--------+------------------+------------------+ ECA       97      17                                                   +----------+--------+--------+--------+------------------+------------------+ +----------+--------+--------+----------------+-------------------+           PSV  cm/sEDV cm/sDescribe        Arm Pressure (mmHG) +----------+--------+--------+----------------+-------------------+ [id number]              Multiphasic, WNL                    +----------+--------+--------+----------------+-------------------+ +---------+--------+--+--------+-+---------+ VertebralPSV cm/s46EDV cm/s9Antegrade +---------+--------+--+--------+-+---------+   Summary: Right Carotid: Velocities in the right ICA are consistent with a 1-39% stenosis. Left Carotid: Velocities in the left ICA are consistent with a 1-39% stenosis. Vertebrals:  Bilateral vertebral arteries demonstrate  antegrade flow. Subclavians: Normal flow hemodynamics were seen in bilateral subclavian              arteries. *See table(s) above for measurements and observations.  Electronically signed by Ruta Hinds MD on 12/29/2020 at 4:32:26 PM.    Final    CT MAXILLOFACIAL WO CONTRAST  Result Date: 12/28/2020 CLINICAL DATA:  Pt walking on back patio, pt says suddenly bright flash of light into eyes, fell forward onto concrete. Denies neck or back pain. Pt with hematoma to forehead. EXAM: CT HEAD WITHOUT CONTRAST CT MAXILLOFACIAL WITHOUT CONTRAST CT CERVICAL SPINE WITHOUT CONTRAST TECHNIQUE: Multidetector CT imaging of the head, cervical spine, and maxillofacial structures were performed using the standard protocol without intravenous contrast. Multiplanar CT image reconstructions of the cervical spine and maxillofacial structures were also generated. COMPARISON:  Head CT, 08/18/2020 FINDINGS: CT HEAD FINDINGS Brain: No evidence of acute infarction, hemorrhage, hydrocephalus, extra-axial collection or mass lesion/mass effect. Vascular: No hyperdense vessel or unexpected calcification. Skull: Normal. Negative for fracture or focal lesion. Other: Low forehead scalp contusion. CT MAXILLOFACIAL FINDINGS Osseous: Mildly comminuted fractures of the nasal bone, minimally depressed, 1-2 mm. No other fractures.  No bone lesions. Orbits: Negative. No traumatic or inflammatory finding. Sinuses: Chronic near complete opacification of the right maxillary sinus. Opacified posterior left ethmoid air cells. Mostly opacified hypoplastic sphenoid sinuses. These findings are stable from head CT dated 08/18/2020. Remaining sinuses are clear. Clear mastoid air cells and middle ear cavities. Soft tissues: Mild right low central forehead scalp contusion. Mild nasal soft tissue swelling suggested. CT CERVICAL SPINE FINDINGS Alignment: Slight reversal of the normal cervical lordosis, apex at C4-C5. No spondylolisthesis. Skull base and  vertebrae: No acute fracture. No primary bone lesion or focal pathologic process. Soft tissues and spinal canal: No prevertebral fluid or swelling. No visible canal hematoma. Disc levels: Mild loss of disc height at C2-C3. Moderate to marked loss of disc height at C3-C4. Marked loss of disc height at C4-C5, C5-C6 and C6-C7. Mild spondylotic disc bulging and endplate spurring noted throughout these levels. There are facet degenerative changes greatest at C2-C3 and C3-C4. No convincing disc herniation. Upper chest: No acute findings. Focal opacity at the right apex is consistent with scarring. Other: None. IMPRESSION: HEAD CT 1. No acute intracranial abnormalities. 2. Small low forehead scalp hematoma.  No skull fracture. MAXILLOFACIAL CT 1. Mildly comminuted, minimally depressed, nasal fractures. 2. No other fractures. CERVICAL CT 1. No fracture or acute finding. Electronically Signed   By: Lajean Manes M.D.   On: 12/28/2020 18:08      Subjective: Some right arm pain.  Discharge Exam: Vitals:   12/29/20 1500 12/29/20 1600  BP: 124/75 121/70  Pulse: 76 79  Resp: 16 15  Temp:    SpO2: 99% 97%   Vitals:   12/29/20 1300 12/29/20 1400 12/29/20 1500 12/29/20 1600  BP: 136/60 (!) 149/78 124/75 121/70  Pulse: (!) 107 76 76 79  Resp: (!) 21 19  16 15  Temp:      TempSrc:      SpO2: 97% 93% 99% 97%  Weight:      Height:        General: Pt is alert, awake, not in acute distress Cardiovascular: RRR, S1/S2 +, no rubs, no gallops Respiratory: CTA bilaterally, no wheezing, no rhonchi Abdominal: Soft, NT, ND, bowel sounds + Extremities: no edema, no cyanosis    The results of significant diagnostics from this hospitalization (including imaging, microbiology, ancillary and laboratory) are listed below for reference.     Microbiology: Recent Results (from the past 240 hour(s))  Blood culture (routine x 2)     Status: None (Preliminary result)   Collection Time: 12/28/20  4:46 PM   Specimen:  BLOOD LEFT HAND  Result Value Ref Range Status   Specimen Description BLOOD LEFT HAND  Final   Special Requests   Final    BOTTLES DRAWN AEROBIC AND ANAEROBIC Blood Culture results may not be optimal due to an inadequate volume of blood received in culture bottles   Culture   Final    NO GROWTH < 24 HOURS Performed at [facility], [place] [address] [place], [place] [address]    Report Status PENDING  Incomplete  Blood culture (routine x 2)     Status: None (Preliminary result)   Collection Time: 12/28/20  5:00 PM   Specimen: BLOOD LEFT FOREARM  Result Value Ref Range Status   Specimen Description BLOOD LEFT FOREARM  Final   Special Requests   Final    BOTTLES DRAWN AEROBIC AND ANAEROBIC Blood Culture results may not be optimal due to an inadequate volume of blood received in culture bottles   Culture   Final    NO GROWTH < 24 HOURS Performed at [facility], [place] [address], [place], [place] [address]    Report Status PENDING  Incomplete  Resp Panel by RT-PCR (Flu A&B, Covid) Nasopharyngeal Swab     Status: None   Collection Time: 12/28/20  6:48 PM   Specimen: Nasopharyngeal Swab; Nasopharyngeal(NP) swabs in vial transport medium  Result Value Ref Range Status   SARS Coronavirus 2 by RT PCR NEGATIVE NEGATIVE Final    Comment: (NOTE) SARS-CoV-2 target nucleic acids are NOT DETECTED.  The SARS-CoV-2 RNA is generally detectable in upper respiratory specimens during the acute phase of infection. The lowest concentration of SARS-CoV-2 viral copies this assay can detect is 138 copies/mL. A negative result does not preclude SARS-Cov-2 infection and should not be used as the sole basis for treatment or other patient management decisions. A negative result may occur with  improper specimen collection/handling, submission of specimen other than nasopharyngeal swab, presence of viral mutation(s) within the areas targeted by this assay, and inadequate number of viral copies(<138  copies/mL). A negative result must be combined with clinical observations, patient history, and epidemiological information. The expected result is Negative.  Fact Sheet for Patients:  EntrepreneurPulse.com.au  Fact Sheet for Healthcare Providers:  IncredibleEmployment.be  This test is no t yet approved or cleared by the [place] FDA and  has been authorized for detection and/or diagnosis of SARS-CoV-2 by FDA under an Emergency Use Authorization (EUA). This EUA will remain  in effect (meaning this test can be used) for the duration of the COVID-19 declaration under Section 564(b)(1) of the Act, 21 U.S.C.section 360bbb-3(b)(1), unless the authorization is terminated  or revoked sooner.       Influenza A by PCR NEGATIVE  NEGATIVE Final   Influenza B by PCR NEGATIVE NEGATIVE Final    Comment: (NOTE) The Xpert Xpress SARS-CoV-2/FLU/RSV plus assay is intended as an aid in the diagnosis of influenza from Nasopharyngeal swab specimens and should not be used as a sole basis for treatment. Nasal washings and aspirates are unacceptable for Xpert Xpress SARS-CoV-2/FLU/RSV testing.  Fact Sheet for Patients: EntrepreneurPulse.com.au  Fact Sheet for Healthcare Providers: IncredibleEmployment.be  This test is not yet approved or cleared by the [place] FDA and has been authorized for detection and/or diagnosis of SARS-CoV-2 by FDA under an Emergency Use Authorization (EUA). This EUA will remain in effect (meaning this test can be used) for the duration of the COVID-19 declaration under Section 564(b)(1) of the Act, 21 U.S.C. section 360bbb-3(b)(1), unless the authorization is terminated or revoked.  Performed at [facility], [place] [address], [place], [place] [address]      Labs: BNP (last 3 results) No results for input(s): BNP in the last 8760 hours. Basic Metabolic Panel: Recent Labs  Lab  12/28/20 1641 12/28/20 1723 12/28/20 2016 12/29/20 0500  NA 132* 133*  --  137  K 3.9 4.2  --  4.2  CL 96* 96*  --  101  CO2 23  --   --  26  GLUCOSE 96 96  --  91  BUN 10 12  --  9  CREATININE 0.82 1.00  --  0.73  CALCIUM 9.4  --   --  9.3  MG 1.7  --   --  2.3  PHOS  --   --  3.7 3.8   Liver Function Tests: Recent Labs  Lab 12/28/20 1641 12/29/20 0500  AST 72* 57*  ALT 36 33  ALKPHOS 104 96  BILITOT 0.8 1.2  PROT 6.3* 6.1*  ALBUMIN 3.2* 3.1*   No results for input(s): LIPASE, AMYLASE in the last 168 hours. No results for input(s): AMMONIA in the last 168 hours. CBC: Recent Labs  Lab 12/28/20 1641 12/28/20 1723 12/29/20 0500  WBC 6.6  --  5.8  NEUTROABS  --   --  3.6  HGB 12.4 13.6 12.2  HCT 37.5 40.0 36.8  MCV 108.7*  --  108.2*  PLT 198  --  165   Cardiac Enzymes: Recent Labs  Lab 12/28/20 2016  CKTOTAL 67   BNP: Invalid input(s): POCBNP CBG: No results for input(s): GLUCAP in the last 168 hours. D-Dimer No results for input(s): DDIMER in the last 72 hours. Hgb A1c No results for input(s): HGBA1C in the last 72 hours. Lipid Profile No results for input(s): CHOL, HDL, LDLCALC, TRIG, CHOLHDL, LDLDIRECT in the last 72 hours. Thyroid function studies Recent Labs    12/29/20 0500  TSH 2.364   Anemia work up No results for input(s): VITAMINB12, FOLATE, FERRITIN, TIBC, IRON, RETICCTPCT in the last 72 hours. Urinalysis    Component Value Date/Time   COLORURINE YELLOW 12/28/2020 1641   APPEARANCEUR HAZY (A) 12/28/2020 1641   LABSPEC 1.009 12/28/2020 1641   PHURINE 6.0 12/28/2020 [place] 12/28/2020 1641   HGBUR NEGATIVE 12/28/2020 1641   [place] 12/28/2020 [place] 12/28/2020 1641   PROTEINUR NEGATIVE 12/28/2020 1641   NITRITE NEGATIVE 12/28/2020 1641   LEUKOCYTESUR LARGE (A) 12/28/2020 1641   Sepsis Labs Invalid input(s): PROCALCITONIN,  WBC,  LACTICIDVEN Microbiology Recent Results (from the  past 240 hour(s))  Blood culture (routine x 2)     Status: None (Preliminary result)  Collection Time: 12/28/20  4:46 PM   Specimen: BLOOD LEFT HAND  Result Value Ref Range Status   Specimen Description BLOOD LEFT HAND  Final   Special Requests   Final    BOTTLES DRAWN AEROBIC AND ANAEROBIC Blood Culture results may not be optimal due to an inadequate volume of blood received in culture bottles   Culture   Final    NO GROWTH < 24 HOURS Performed at [facility], [place] [address], [place], [place] [address]    Report Status PENDING  Incomplete  Blood culture (routine x 2)     Status: None (Preliminary result)   Collection Time: 12/28/20  5:00 PM   Specimen: BLOOD LEFT FOREARM  Result Value Ref Range Status   Specimen Description BLOOD LEFT FOREARM  Final   Special Requests   Final    BOTTLES DRAWN AEROBIC AND ANAEROBIC Blood Culture results may not be optimal due to an inadequate volume of blood received in culture bottles   Culture   Final    NO GROWTH < 24 HOURS Performed at [facility], [place] [address] [place], [place] [address]    Report Status PENDING  Incomplete  Resp Panel by RT-PCR (Flu A&B, Covid) Nasopharyngeal Swab     Status: None   Collection Time: 12/28/20  6:48 PM   Specimen: Nasopharyngeal Swab; Nasopharyngeal(NP) swabs in vial transport medium  Result Value Ref Range Status   SARS Coronavirus 2 by RT PCR NEGATIVE NEGATIVE Final    Comment: (NOTE) SARS-CoV-2 target nucleic acids are NOT DETECTED.  The SARS-CoV-2 RNA is generally detectable in upper respiratory specimens during the acute phase of infection. The lowest concentration of SARS-CoV-2 viral copies this assay can detect is 138 copies/mL. A negative result does not preclude SARS-Cov-2 infection and should not be used as the sole basis for treatment or other patient management decisions. A negative result may occur with  improper specimen collection/handling, submission of specimen other than  nasopharyngeal swab, presence of viral mutation(s) within the areas targeted by this assay, and inadequate number of viral copies(<138 copies/mL). A negative result must be combined with clinical observations, patient history, and epidemiological information. The expected result is Negative.  Fact Sheet for Patients:  EntrepreneurPulse.com.au  Fact Sheet for Healthcare Providers:  IncredibleEmployment.be  This test is no t yet approved or cleared by the [place] FDA and  has been authorized for detection and/or diagnosis of SARS-CoV-2 by FDA under an Emergency Use Authorization (EUA). This EUA will remain  in effect (meaning this test can be used) for the duration of the COVID-19 declaration under Section 564(b)(1) of the Act, 21 U.S.C.section 360bbb-3(b)(1), unless the authorization is terminated  or revoked sooner.       Influenza A by PCR NEGATIVE NEGATIVE Final   Influenza B by PCR NEGATIVE NEGATIVE Final    Comment: (NOTE) The Xpert Xpress SARS-CoV-2/FLU/RSV plus assay is intended as an aid in the diagnosis of influenza from Nasopharyngeal swab specimens and should not be used as a sole basis for treatment. Nasal washings and aspirates are unacceptable for Xpert Xpress SARS-CoV-2/FLU/RSV testing.  Fact Sheet for Patients: EntrepreneurPulse.com.au  Fact Sheet for Healthcare Providers: IncredibleEmployment.be  This test is not yet approved or cleared by the [place] FDA and has been authorized for detection and/or diagnosis of SARS-CoV-2 by FDA under an Emergency Use Authorization (EUA). This EUA will remain in effect (meaning this test can be used) for the duration of the COVID-19 declaration  under Section 564(b)(1) of the Act, 21 U.S.C. section 360bbb-3(b)(1), unless the authorization is terminated or revoked.  Performed at [facility], [place] [address] [place], [place] [address]      SIGNED:   Cordelia Poche, MD [facility] 12/29/2020, 5:20 PM

## 2020-12-30 ENCOUNTER — Encounter: Payer: Self-pay | Admitting: Cardiology

## 2020-12-30 NOTE — Telephone Encounter (Signed)
ED CM attempted to call patient to inform her  of referral which was sent to [place] at [facility] [address]. No answer CM will make 2nd attempt later today

## 2020-12-31 NOTE — Telephone Encounter (Signed)
Follow Up:     Patient said Dr [name] had increased her Losartan. She was admitted to the hospital and they told her to decrease the Losartan. Which should she take 2 times a day or 1 time a day? She will need a new prescription for which ever is decided on please.

## 2020-12-31 NOTE — Telephone Encounter (Signed)
Pt called to report that she was in the hospital at [facility] for 2 days due to a fall that she had many injuries from and severe hypotension.   She was advised to decrease her Losartan to 50 mg daily.. will send in new RX or her request.   She will call Dr. Harrington Challenger her PCP to see him hopefully by the end of this wek per her D/C instructions.   She will continue to drink plenty of fluids and stay in form the heat. She will also monitor her BP and let us know how it dis doing.   Will forward to Dr. [name] for his review when he returns to the office.

## 2021-01-01 ENCOUNTER — Telehealth: Payer: Self-pay | Admitting: Cardiology

## 2021-01-01 MED ORDER — LOSARTAN POTASSIUM 50 MG PO TABS: 50.0000 mg | ORAL_TABLET | Freq: Every day | ORAL | Status: DC

## 2021-01-01 NOTE — Telephone Encounter (Signed)
Spoke with Patient and informed her that a refill was sent today 01/01/21

## 2021-01-01 NOTE — Telephone Encounter (Signed)
*  STAT* If patient is at the pharmacy, call can be transferred to refill team.   1. Which medications need to be refilled? (please list name of each medication and dose if known) losartan (COZAAR) 50 MG tablet  2. Which pharmacy/location (including street and city if local pharmacy) is medication to be sent to? [id number] [place], [place] 3. Do they need a 30 day or 90 day supply? [address]

## 2021-01-03 LAB — CULTURE, BLOOD (ROUTINE X 2)
Culture: NO GROWTH
Culture: NO GROWTH

## 2021-01-16 ENCOUNTER — Ambulatory Visit (INDEPENDENT_AMBULATORY_CARE_PROVIDER_SITE_OTHER): Payer: Medicare Other | Admitting: Cardiology

## 2021-01-16 ENCOUNTER — Encounter: Payer: Self-pay | Admitting: Cardiology

## 2021-01-16 ENCOUNTER — Other Ambulatory Visit: Payer: Self-pay

## 2021-01-16 VITALS — BP 118/64 | HR 86 | Ht 60.0 in | Wt 145.8 lb

## 2021-01-16 DIAGNOSIS — R55 Syncope and collapse: Secondary | ICD-10-CM

## 2021-01-16 DIAGNOSIS — I4821 Permanent atrial fibrillation: Secondary | ICD-10-CM

## 2021-01-16 DIAGNOSIS — I10 Essential (primary) hypertension: Secondary | ICD-10-CM

## 2021-01-16 MED ORDER — LOSARTAN POTASSIUM 25 MG PO TABS: 25.0000 mg | ORAL_TABLET | Freq: Every day | ORAL | 3 refills | Status: DC

## 2021-01-16 NOTE — Progress Notes (Signed)
HPI:  FU atrial fibrillation. She has been managed with rate control and anticoagulation.  At previous office visit patient had developed worsening lower extremity edema and dyspnea while on a trip to [place].  We discontinued Maxide and treated with Lasix and potassium. ProBNP 433.  Echocardiogram July 2022 showed ejection fraction 55%, moderate pulmonary hypertension, mild RV dysfunction, mild right ventricular enlargement, moderate left atrial enlargement, mild right atrial enlargement, mild aortic insufficiency.  Carotid Dopplers July 2022 showed 1 to 39% bilateral stenosis.  Patient had syncopal episode July 2022.  At the time patient's blood pressure was low; she was in her car and stood and walked with a porch and suddenly felt lightheaded with syncope.  Losartan was decreased.  She was also felt to be dehydrated.  Since last seen, she denies dyspnea, chest pain, palpitations or recurrent syncope.  No bleeding.  She states her blood pressure is running with a systolic of 123456.  Current Outpatient Medications  Medication Sig Dispense Refill   albuterol (PROVENTIL HFA;VENTOLIN HFA) 108 (90 Base) MCG/ACT inhaler Inhale 2 puffs into the lungs every 6 (six) hours as needed for wheezing or shortness of breath. For wheezing/shortness of breath (Patient not taking: Reported on 11/11/2020)     albuterol (VENTOLIN HFA) 108 (90 Base) MCG/ACT inhaler Inhale 1 puff into the lungs every 6 (six) hours as needed for wheezing or shortness of breath.     ALPRAZolam (XANAX) 0.5 MG tablet Take 0.5 mg by mouth daily as needed for anxiety.      ALPRAZolam (XANAX) 0.5 MG tablet Take 0.5 mg by mouth daily as needed for anxiety.     B Complex Vitamins (VITAMIN B COMPLEX PO) Take 1 capsule by mouth daily.     BREO ELLIPTA 100-25 MCG/INH AEPB Inhale 1 puff into the lungs daily.     COLLAGEN PO Take 1 capsule by mouth daily.     desonide (DESOWEN) 0.05 % cream Apply 1 application topically 2 (two) times daily as  needed (for skin irritation.).     diclofenac sodium (VOLTAREN) 1 % GEL Apply 2 g topically daily as needed (pain).      diclofenac Sodium (VOLTAREN) 1 % GEL Apply 2 g topically 4 (four) times daily as needed (joint pain).     EPINEPHrine (EPIPEN 2-PAK) 0.3 mg/0.3 mL IJ SOAJ injection Inject 0.3 mg into the muscle once.     EPINEPHrine 0.3 mg/0.3 mL IJ SOAJ injection Inject 0.3 mg into the muscle as needed for anaphylaxis.     famotidine (PEPCID) 20 MG tablet Take 1 tablet by mouth as directed.     famotidine (PEPCID) 20 MG tablet Take 20 mg by mouth See admin instructions. Take '20mg'$  by mouth once daily, may take an additional '20mg'$  as needed for acid reflux     fluorouracil (EFUDEX) 5 % cream Apply 1 application topically as directed. Per MD instructions, for skin cancer     fluticasone (FLONASE) 50 MCG/ACT nasal spray fluticasone propionate 50 mcg/actuation nasal spray,suspension  SPRAY 2 SPRAYS INTO EACH NOSTRIL EVERY DAY FOR 30 DAYS     fluticasone (FLONASE) 50 MCG/ACT nasal spray Place 2 sprays into both nostrils daily.     fluticasone furoate-vilanterol (BREO ELLIPTA) 100-25 MCG/INH AEPB Inhale 1 puff into the lungs daily.     furosemide (LASIX) 20 MG tablet TAKE 1 TABLET BY MOUTH EVERY DAY 90 tablet 1   furosemide (LASIX) 20 MG tablet Take 20 mg by mouth daily.  letrozole (FEMARA) 2.5 MG tablet Take 1 tablet (2.5 mg total) by mouth daily. 90 tablet 4   letrozole (FEMARA) 2.5 MG tablet Take 2.5 mg by mouth at bedtime.     levocetirizine (XYZAL) 5 MG tablet Take 5 mg by mouth at bedtime.  (Patient not taking: Reported on 11/11/2020)     levocetirizine (XYZAL) 5 MG tablet Take 5 mg by mouth at bedtime.     losartan (COZAAR) 50 MG tablet Take 1 tablet (50 mg total) by mouth daily.     Methylsulfonylmethane (MSM PO) Take 1 capsule by mouth daily.     MILK THISTLE PO Take 1 capsule by mouth daily.     Misc Natural Products (PYCNOGENOL COMPLEX PO) Take 1 capsule by mouth daily.     Omega-3  Fatty Acids (OMEGA 3 PO) Take 1 capsule by mouth daily.     OVER THE COUNTER MEDICATION Take 1 capsule by mouth daily. Immunity supplement     Polyethyl Glycol-Propyl Glycol (SYSTANE OP) Place 1 drop into both eyes daily.     potassium chloride (KLOR-CON) 20 MEQ packet Take 20 mEq by mouth daily.     potassium chloride SA (KLOR-CON) 20 MEQ tablet Take 1 tablet (20 mEq total) by mouth daily. 90 tablet 3   propranolol ER (INDERAL LA) 80 MG 24 hr capsule Take 80 mg by mouth daily.     propranolol ER (INDERAL LA) 80 MG 24 hr capsule Take 80 mg by mouth daily.     TURMERIC PO Take 1 capsule by mouth daily.     venlafaxine XR (EFFEXOR-XR) 75 MG 24 hr capsule TAKE 1 CAPSULE (75 MG TOTAL) BY MOUTH DAILY WITH BREAKFAST. 90 capsule 2   venlafaxine XR (EFFEXOR-XR) 75 MG 24 hr capsule Take 75 mg by mouth daily with breakfast.     VITAMIN D PO Take 1 capsule by mouth daily.     XARELTO 20 MG TABS tablet TAKE 1 TABLET BY MOUTH DAILY WITH SUPPER. 90 tablet 1   XARELTO 20 MG TABS tablet Take 20 mg by mouth daily.     No current facility-administered medications for this visit.     Past Medical History:  Diagnosis Date   A-fib ([place])    Anemia    history of   Anxiety    Arthritis    Asthma    Asthma    Breast cancer ([place])    Cancer ([place])    skin cancers, one melanoma    Complication of anesthesia    patient woke up during colonoscopy   Concussion    8 yrs. 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    Keratosis, actinic    Melanoma ([place])    Persistent atrial fibrillation ([facility]) 12/16/2016   CHA2DS2VASC score 3   Pneumonia     hx of     Past Surgical History:  Procedure Laterality Date   brachial skin removal due to deforimity Bilateral    BREAST ENHANCEMENT SURGERY Left 11/14/2018   Procedure: LEFT BREAST AUGMENTATION;  Surgeon: [name], MD;  Location: [facility];  Service: Plastics;  Laterality: Left;   BREAST RECONSTRUCTION Right 06/26/2018   BREAST RECONSTRUCTION WITH PLACEMENT OF TISSUE EXPANDER AND ALLODERM Right 06/26/2018   Procedure: RIGHT BREAST RECONSTRUCTION WITH PLACEMENT OF TISSUE EXPANDER;  Surgeon: [name], MD;  Location: [facility];  Service: Plastics;  Laterality: Right;   BREAST RECONSTRUCTION WITH PLACEMENT OF TISSUE EXPANDER AND FLEX HD (ACELLULAR HYDRATED DERMIS) Right 01/28/2017    RIGHT BREAST RECONSTRUCTION WITH PLACEMENT OF TISSUE EXPANDER AND ALLODERM;  Surgeon: [name], MD;  Location: [facility];  Service: Plastics;  Laterality: Right;   BUNIONECTOMY     left    CATARACT EXTRACTION     bil[place]la cataract surgery    COLONOSCOPY W/ POLYPECTOMY     JOINT REPLACEMENT     KNEE ARTHROSCOPY     left    LATISSIMUS FLAP TO BREAST Right 06/26/2018   Procedure: LATISSIMUS FLAP TO RIGHT CHEST;  Surgeon: [name], MD;  Location: [facility];  Service: Plastics;  Laterality: Right;   MASTECTOMY Right    MASTECTOMY W/ SENTINEL NODE BIOPSY Right 01/28/2017   Procedure: RIGHT TOTAL MASTECTOMY WITH SENTINEL LYMPH NODE BIOPSY;  Surgeon: Excell Seltzer, MD;  Location: [facility];  Service: General;  Laterality: Right;   MASTOPEXY Left 11/14/2018   Procedure: LEFT BREAST MASTOPEXY;  Surgeon: [name], MD;  Location: [facility];  Service: Plastics;  Laterality: Left;   MELANOMA EXCISION Left    polyp removed from uterus      REMOVAL OF TISSUE EXPANDER AND PLACEMENT OF IMPLANT Right 11/14/2018   Procedure: REMOVAL OF RIGHT TISSUE EXPANDER AND PLACEMENT OF SALINE IMPLANT;  Surgeon: [name], MD;  Location: [facility];  Service:  Plastics;  Laterality: Right;   right wrist pinning      skin cancers removed     TISSUE EXPANDER PLACEMENT Right 02/27/2018   Procedure: REMOVAL OF TISSUE EXPANDER;  Surgeon: [name], MD;  Location: [facility];  Service: Plastics;  Laterality: Right;   TOTAL KNEE ARTHROPLASTY Right 01/29/2013   Procedure: RIGHT TOTAL KNEE ARTHROPLASTY;  Surgeon: Gearlean Alf, MD;  Location: [facility];  Service: Orthopedics;  Laterality: Right;   TOTAL KNEE ARTHROPLASTY Left 08/29/2017   Procedure: LEFT TOTAL KNEE ARTHROPLASTY;  Surgeon: Gaynelle Arabian, MD;  Location: [facility];  Service: Orthopedics;  Laterality: Left;    Social History   Socioeconomic History   Marital status: Married    Spouse name: Not on file   Number of children: Not on file   Years of education: Not on file   Highest education level: Not on file  Occupational History   Not on file  Tobacco Use   Smoking status: Never   Smokeless tobacco: Never  Vaping Use   Vaping Use: Never used  Substance and Sexual Activity   Alcohol use: Yes    Alcohol/week: 1.0 standard drink    Types: 1 Glasses of wine per week    Comment: occ   Drug use: No   Sexual activity: Not on file  Other Topics Concern   Not on file  Social History Narrative   ** Merged History Encounter **       Social Determinants of Health   Financial Resource Strain: Not on file  Food Insecurity: Not on file  Transportation Needs: Not on file  Physical Activity: Not on file  Stress: Not on file  Social Connections: Not on file  Intimate Partner Violence: Not on file  Family History  Problem Relation Age of Onset   Hypertension Other    COPD Father    Breast cancer Other        dx in her 20s-30s    ROS: no fevers or chills, productive cough, hemoptysis, dysphasia, odynophagia, melena, hematochezia, dysuria, hematuria, rash, seizure activity, orthopnea, PND, pedal edema, claudication. Remaining systems are negative.  Physical Exam: Well-developed  well-nourished in no acute distress.  Skin is warm and dry.  HEENT with ecchymosis over her forehead Neck is supple.  Chest is clear to auscultation with normal expansion.  Cardiovascular exam is irregular Abdominal exam nontender or distended. No masses palpated. Extremities show no edema. neuro grossly intact  A/P  1 recent syncope-this was felt to be secondary to low blood pressure and possible contribution from orthostasis.    2 permanent atrial fibrillation-plan to continue beta-blocker for rate control.  Continue Xarelto.  3 hypertension-patient's blood pressure has been running low and she had a recent syncopal episode.  I would like to allow her blood pressure to run higher.  We will decrease losartan to 25 mg daily (I suggested discontinuing but she was hesitant).  If her blood pressure continues to run low we will discontinue altogether.  Follow blood pressure and adjust as needed.  4 history of edema-given recent syncope likely orthostatic mediated will change Lasix to 20 mg daily as needed for weight gain of 2 to 3 pounds or increased peripheral edema.  She will only take her potassium when she takes a Lasix.  Kirk Ruths, MD

## 2021-01-16 NOTE — Patient Instructions (Signed)
Medication Instructions:   TAKE FUROSEMIDE 20 MG ONCE DAILY AS NEEDED FOR SWELLING OR WEIGHT GAIN OF 2-3 LBS  TAKE POTASSIUM ONLY WHEN YOU TAKE FUROSEMIDE  DECREASE LOSARTAN TO 25 MG ONCE DAILY= 1/2 OF THE 50 MG TABLET ONCE DAILY  *If you need a refill on your cardiac medications before your next appointment, please call your pharmacy*   Follow-Up: At [facility], you and your health needs are our priority.  As part of our continuing mission to provide you with exceptional heart care, we have created designated Provider Care Teams.  These Care Teams include your primary Cardiologist (physician) and Advanced Practice Providers (APPs -  Physician Assistants and Nurse Practitioners) who all work together to provide you with the care you need, when you need it.  We recommend signing up for the patient portal called "MyChart".  Sign up information is provided on this After Visit Summary.  MyChart is used to connect with patients for Virtual Visits (Telemedicine).  Patients are able to view lab/test results, encounter notes, upcoming appointments, etc.  Non-urgent messages can be sent to your provider as well.   To learn more about what you can do with MyChart, go to [email or web address].    Your next appointment:   3 month(s)  The format for your next appointment:   In Person  Provider:   Kirk Ruths, MD

## 2021-01-27 ENCOUNTER — Ambulatory Visit: Payer: Medicare Other | Attending: Family Medicine | Admitting: Physical Therapy

## 2021-01-27 ENCOUNTER — Encounter: Payer: Self-pay | Admitting: Physical Therapy

## 2021-01-27 ENCOUNTER — Other Ambulatory Visit: Payer: Self-pay

## 2021-01-27 DIAGNOSIS — M6281 Muscle weakness (generalized): Secondary | ICD-10-CM | POA: Diagnosis present

## 2021-01-27 DIAGNOSIS — R296 Repeated falls: Secondary | ICD-10-CM | POA: Insufficient documentation

## 2021-01-27 DIAGNOSIS — R2689 Other abnormalities of gait and mobility: Secondary | ICD-10-CM | POA: Diagnosis present

## 2021-01-27 NOTE — Patient Instructions (Signed)
Access Code: [email or web address] URL: [email or web address] Date: 01/27/2021 Prepared by: Hilda Blades  Exercises Romberg Stance with Head Nods - 2 x daily - 7 x weekly - 2 sets - 10 reps Romberg Stance with Head Rotation - 2 x daily - 7 x weekly - 2 sets - 10 reps Standing Romberg to 1/2 Tandem Stance - 2 x daily - 7 x weekly - 2 sets - 20 hold Sit to Stand with Arms Crossed - 2 x daily - 7 x weekly - 2 sets - 10 reps Standing Hip Abduction with Counter Support - 2 x daily - 7 x weekly - 2 sets - 10 reps Standing Marching - 2 x daily - 7 x weekly - 2 sets - 20 reps

## 2021-01-27 NOTE — Therapy (Signed)
[place], [place], [address] Phone: [phone]   Fax:  [phone]  Physical Therapy Evaluation  Patient Details  Name: [name] MRN: [id number] Date of Birth: [birth year] Referring Provider (PT): Mariel Aloe, MD   Encounter Date: 01/27/2021   PT End of Session - 01/27/21 1143     Visit Number 1    Number of Visits 8    Date for PT Re-Evaluation 03/24/21    Authorization Type [employer] MCR    Progress Note Due on Visit 10    PT Start Time 1130    PT Stop Time 1215    PT Time Calculation (min) 45 min    Activity Tolerance Patient tolerated treatment well    Behavior During Therapy [facility] for tasks assessed/performed             Past Medical History:  Diagnosis Date   A-fib ([place])    Anemia    history of   Anxiety    Arthritis    Asthma    Asthma    Breast cancer ([place])    Cancer ([place])    skin cancers, one melanoma    Complication of anesthesia    patient woke up during colonoscopy   Concussion    8 yrs. 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    Keratosis, actinic    Melanoma ([place])    Persistent atrial fibrillation ([place]) 12/16/2016   CHA2DS2VASC score 3   Pneumonia    hx of     Past Surgical History:  Procedure Laterality Date   brachial skin removal due to deforimity Bilateral    BREAST ENHANCEMENT SURGERY Left 11/14/2018   Procedure: LEFT BREAST AUGMENTATION;  Surgeon: [name], MD;  Location: [place];  Service: Plastics;  Laterality: Left;   BREAST RECONSTRUCTION Right 06/26/2018    BREAST RECONSTRUCTION WITH PLACEMENT OF TISSUE EXPANDER AND ALLODERM Right 06/26/2018   Procedure: RIGHT BREAST RECONSTRUCTION WITH PLACEMENT OF TISSUE EXPANDER;  Surgeon: [name], MD;  Location: [place];  Service: Plastics;  Laterality: Right;   BREAST RECONSTRUCTION WITH PLACEMENT OF TISSUE EXPANDER AND FLEX HD (ACELLULAR HYDRATED DERMIS) Right 01/28/2017    RIGHT BREAST RECONSTRUCTION WITH PLACEMENT OF TISSUE EXPANDER AND ALLODERM;  Surgeon: [name], MD;  Location: [place];  Service: Plastics;  Laterality: Right;   BUNIONECTOMY     left    CATARACT EXTRACTION     [place] cataract surgery    COLONOSCOPY W/ POLYPECTOMY     JOINT REPLACEMENT     KNEE ARTHROSCOPY     left    LATISSIMUS FLAP TO BREAST Right 06/26/2018   Procedure: LATISSIMUS FLAP TO RIGHT CHEST;  Surgeon: [name], MD;  Location: [place];  Service: Plastics;  Laterality: Right;   MASTECTOMY Right    MASTECTOMY W/ SENTINEL NODE BIOPSY Right 01/28/2017   Procedure: RIGHT TOTAL MASTECTOMY WITH SENTINEL LYMPH NODE BIOPSY;  Surgeon: Excell Seltzer, MD;  Location: [place];  Service: General;  Laterality: Right;   MASTOPEXY  Left 11/14/2018   Procedure: LEFT BREAST MASTOPEXY;  Surgeon: [name], MD;  Location: [place];  Service: Plastics;  Laterality: Left;   MELANOMA EXCISION Left    polyp removed from uterus      REMOVAL OF TISSUE EXPANDER AND PLACEMENT OF IMPLANT Right 11/14/2018   Procedure: REMOVAL OF RIGHT TISSUE EXPANDER AND PLACEMENT OF SALINE IMPLANT;  Surgeon: [name], MD;  Location: [place];  Service: Plastics;  Laterality: Right;   right wrist pinning      skin cancers removed     TISSUE EXPANDER PLACEMENT Right 02/27/2018   Procedure: REMOVAL OF TISSUE EXPANDER;  Surgeon: [name], MD;  Location: [place];  Service: Plastics;  Laterality: Right;   TOTAL KNEE ARTHROPLASTY Right 01/29/2013   Procedure: RIGHT TOTAL KNEE ARTHROPLASTY;  Surgeon:  Gearlean Alf, MD;  Location: [facility];  Service: Orthopedics;  Laterality: Right;   TOTAL KNEE ARTHROPLASTY Left 08/29/2017   Procedure: LEFT TOTAL KNEE ARTHROPLASTY;  Surgeon: Gaynelle Arabian, MD;  Location: [facility];  Service: Orthopedics;  Laterality: Left;    There were no vitals filed for this visit.    Subjective Assessment - 01/27/21 1134     Subjective Patient reports she has frequent falls. Feels like things have been worsening recently. The most recent fall occurred due to a drop in blood pressure and being dehydrated, but states most of the time loss of balance when on unstable or unlevel surface. She has decreased her activity level due to fear of falling and further injury. Patient does not use an assistive device in home or community, but she has before and prefers a walker when needed.    Pertinent History Bilateral TKA    Limitations Walking;House hold activities    How long can you walk comfortably? No limitation    Patient Stated Goals Improve balance to reduce falls without having to use assistive device    Currently in Pain? No/denies                [facility] PT Assessment - 01/27/21 0001       Assessment   Medical Diagnosis Balance    Referring Provider (PT) Mariel Aloe, MD    Onset Date/Surgical Date --   ongoing for 6+ months   Next MD Visit Not scheduled    Prior Therapy Yes      Precautions   Precautions Fall      Restrictions   Weight Bearing Restrictions No      Balance Screen   Has the patient fallen in the past 6 months Yes    How many times? 5    Has the patient had a decrease in activity level because of a fear of falling?  Yes    Is the patient reluctant to leave their home because of a fear of falling?  No      Home Environment   Living Environment Private residence    Living Arrangements Spouse/significant other    Home Access Stairs to enter    Entrance Stairs-Number of Steps 1    Entrance Stairs-Rails Left    [place] One level     Additional Comments Step into sunroom      Prior Function   Level of Independence Independent    Leisure Yardwork      Cognition   Overall Cognitive Status Within Functional Limits for tasks assessed      Observation/Other Assessments   Observations Patient appears in no  apparent distress    Focus on Therapeutic Outcomes (FOTO)  48% functional status      Sensation   Light Touch Appears Intact      Coordination   Gross Motor Movements are Fluid and Coordinated Yes      ROM / Strength   AROM / PROM / Strength Strength      Strength   Strength Assessment Site Hip;Knee;Ankle    Right/Left Hip Right;Left    Right Hip Flexion 4-/5    Right Hip Extension 4-/5    Right Hip ABduction 4-/5    Left Hip Flexion 4-/5    Left Hip Extension 4-/5    Left Hip ABduction 4-/5    Right/Left Knee Right;Left    Right Knee Flexion 4/5    Right Knee Extension 4/5    Left Knee Flexion 4/5    Left Knee Extension 4/5    Right/Left Ankle Right;Left    Right Ankle Dorsiflexion 5/5    Right Ankle Plantar Flexion 4/5    Left Ankle Dorsiflexion 5/5    Left Ankle Plantar Flexion 4/5      Transfers   Transfers Independent with all Transfers      Ambulation/Gait   Ambulation/Gait Yes    Ambulation/Gait Assistance 7: Independent    Assistive device None    Gait Comments Initially with wide base of support, decreased step length, low guard with no arm swing; improves with continued walking      Standardized Balance Assessment   Standardized Balance Assessment Berg Balance Test;Five Times Sit to Stand;Timed Up and Go Test    Five times sit to stand comments  16   without UE assist     Berg Balance Test   Sit to Stand Able to stand without using hands and stabilize independently    Standing Unsupported Able to stand safely 2 minutes    Sitting with Back Unsupported but Feet Supported on Floor or Stool Able to sit safely and securely 2 minutes    Stand to Sit Sits safely with minimal use of  hands    Transfers Able to transfer safely, minor use of hands    Standing Unsupported with Eyes Closed Able to stand 10 seconds with supervision    Standing Unsupported with Feet Together Able to place feet together independently and stand for 1 minute with supervision    From Standing, Reach Forward with Outstretched Arm Can reach forward >12 cm safely (5")    From Standing Position, Pick up Object from Floor Able to pick up shoe, needs supervision    From Standing Position, Turn to Look Behind Over each Shoulder Turn sideways only but maintains balance    Turn 360 Degrees Able to turn 360 degrees safely but slowly    Standing Unsupported, Alternately Place Feet on Step/Stool Needs assistance to keep from falling or unable to try    Standing Unsupported, One Foot in [employer] balance while stepping or standing    Standing on One Leg Tries to lift leg/unable to hold 3 seconds but remains standing independently    Total Score 37      Timed Up and Go Test   TUG Normal TUG    Normal TUG (seconds) 12                        Objective measurements completed on examination: See above findings.       [place] Adult PT Treatment/Exercise - 01/27/21 0001  Neuro Re-ed    Neuro Re-ed Details  [place] with head nods and turns x 10 each, 1/2 tandem x 20" each      Exercises   Exercises Knee/Hip      Knee/Hip Exercises: Standing   Hip Flexion 20 reps    Hip Flexion Limitations alternating march with single UE assist    Hip Abduction 10 reps;Both      Knee/Hip Exercises: Seated   Sit to Sand 10 reps;without UE support                    PT Education - 01/27/21 1142     Education Details Exam findings, increased fall risk, POC, HEP    Person(s) Educated Patient    Methods Explanation;Demonstration;Verbal cues;Handout    Comprehension Verbalized understanding;Returned demonstration;Verbal cues required;Need further instruction              PT Short  Term Goals - 01/27/21 1145       PT SHORT TERM GOAL #1   Title Patient will be I with initial HEP to progress with PT    Time 4    Period Weeks    Status New    Target Date 02/24/21      PT SHORT TERM GOAL #2   Title PT will review FOTO with patient by 3rd visit to understand expected progress with therapy    Time 3    Period Weeks    Status New    Target Date 02/17/21               PT Long Term Goals - 01/27/21 1223       PT LONG TERM GOAL #1   Title Patient will be I with final HEP to maintain progress from PT    Time 8    Period Weeks    Status New    Target Date 03/24/21      PT LONG TERM GOAL #2   Title Patient will report >/= 54% functional status on FOTO to indicate improved confidence with balance and improve functional level    Baseline 48%    Time 8    Period Weeks    Status New    Target Date 03/24/21      PT LONG TERM GOAL #3   Title Patient will improve 5xSTS to </= 12 seconds to indicate improved strength and reduced risk of falls    Baseline 16 seconds    Time 8    Period Weeks    Status New    Target Date 03/24/21      PT LONG TERM GOAL #4   Title Patient will demonstrate an improved BERG balance assessment >/= 50/56 in order to indicate improved balance and reduced risk of falls    Baseline 37/56    Time 8    Period Weeks    Status New    Target Date 03/24/21      PT LONG TERM GOAL #5   Title Patient will exhibit improved TUG  to </= 9 seconds to indicate improved gait and reduced risk of falls    Baseline 12 seconds    Time 8    Period Weeks    Status New    Target Date 03/24/21      Additional Long Term Goals   Additional Long Term Goals Yes      PT LONG TERM GOAL #6   Title Patient will exhibit improved gross hip strength >/= 4/5 MMT  and knee strength >/= 4+/5 MMT to improve curb negotation in community    Time 8    Period Weeks    Status New    Target Date 03/24/21                    Plan - 01/27/21 1143      Clinical Impression Statement Patient presents to PT with report of frequent falls with balance and gait deficits. This has been ongoing with no apparent mechanism, and patient reports worsening of symtpoms recently. Currently patient does exhibit increased fall risk based on BERG balance assessment and likely would benefit from using an assistive device in the community. She also demonstrates gross hip and LE weakness that is likely contributing to her gait and balance deficits. Patient reports fear and anxiety with tasks that involved single leg stance or stepping, as well as closing her eyes with balance. Patient was provided exercises to initiate balance training and strengthening, and she would benefit from continued skilled PT to improve confidence with ambulation and reduce her risk of falls in order to return to prior activities such as yardwork.    Personal Factors and Comorbidities Fitness;Time since onset of injury/illness/exacerbation;Past/Current Experience;Age    Examination-Activity Limitations Locomotion Level;Stairs;Stand;Lift;Carry    Examination-Participation Restrictions Meal Prep;Cleaning;Community Activity;Shop;Yard Work    Stability/Clinical Decision Making Stable/Uncomplicated    Clinical Decision Making Low    Rehab Potential Good    PT Frequency 1x / week    PT Duration 8 weeks    PT Treatment/Interventions ADLs/Self Care Home Management;Gait training;Stair training;Functional mobility training;Therapeutic activities;Therapeutic exercise;Balance training;Neuromuscular re-education;Manual techniques;Passive range of motion;Dry needling;Spinal Manipulations;Joint Manipulations;Patient/family education    PT Next Visit Plan Review HEP and progress PRN, balance training progressing to unstable surface or working outside of base of support, reactive step training, step-ups or curb/stair training, generalized LE strengthening    PT [place] and  Agree with Plan of Care Patient             Patient will benefit from skilled therapeutic intervention in order to improve the following deficits and impairments:  Abnormal gait, Difficulty walking, Decreased balance, Decreased strength, Decreased activity tolerance  Visit Diagnosis: Repeated falls  Muscle weakness (generalized)  Other abnormalities of gait and mobility     Problem List Patient Active Problem List   Diagnosis Date Noted   A-fib ([place]) 12/29/2020   Asthma, chronic, unspecified asthma severity, with acute exacerbation 12/29/2020   GAD (generalized anxiety disorder) 12/29/2020   History of breast cancer 12/29/2020   GERD (gastroesophageal reflux disease) 12/29/2020   Acute lower UTI 12/29/2020   Lactic acid increased 12/29/2020   Syncope 12/28/2020   Hypotension 12/28/2020   Hyponatremia 12/28/2020   History of breast cancer 06/26/2018   Genetic testing 12/24/2016   Persistent atrial fibrillation ([place]) 12/16/2016   Long term current use of anticoagulant therapy 12/16/2016   Hypertensive heart disease without CHF    Family history of breast cancer    Melanoma ([place])    Malignant neoplasm of upper-inner quadrant of right breast in female, estrogen receptor positive ([place]) 11/19/2016   OA (osteoarthritis) of knee 01/29/2013    Hilda Blades, PT, DPT, LAT, ATC 01/27/21  1:28 PM Phone: [phone] Fax: [place] [facility] [address] [place], [place], [address] Phone: [phone]   Fax:  [phone]  Name: [name] MRN: [id number] Date of Birth: March 24, 1941

## 2021-02-04 ENCOUNTER — Ambulatory Visit: Payer: Medicare Other | Admitting: Physical Therapy

## 2021-02-04 ENCOUNTER — Encounter: Payer: Self-pay | Admitting: Physical Therapy

## 2021-02-04 ENCOUNTER — Other Ambulatory Visit: Payer: Self-pay

## 2021-02-04 DIAGNOSIS — R296 Repeated falls: Secondary | ICD-10-CM

## 2021-02-04 DIAGNOSIS — R2689 Other abnormalities of gait and mobility: Secondary | ICD-10-CM

## 2021-02-04 DIAGNOSIS — M6281 Muscle weakness (generalized): Secondary | ICD-10-CM

## 2021-02-04 NOTE — Therapy (Signed)
[name], [place], [address] Phone: [phone]   Fax:  [phone]  Physical Therapy Treatment  Patient Details  Name: [name] MRN: [id number] Date of Birth: 1941/03/20 Referring Provider (PT): Mariel Aloe, MD   Encounter Date: 02/04/2021   PT End of Session - 02/04/21 1305     Visit Number 2    Number of Visits 8    Date for PT Re-Evaluation 03/24/21    Authorization Type UHC MCR    Progress Note Due on Visit 10    PT Start Time [id number]    PT Stop Time 1348    PT Time Calculation (min) 43 min    Activity Tolerance Patient tolerated treatment well    Behavior During Therapy [facility] for tasks assessed/performed             Past Medical History:  Diagnosis Date   A-fib ([place])    Anemia    history of   Anxiety    Arthritis    Asthma    Asthma    Breast cancer ([place])    Cancer ([place])    skin cancers, one melanoma    Complication of anesthesia    patient woke up during colonoscopy   Concussion    8 yrs. 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    Keratosis, actinic    Melanoma ([place])    Persistent atrial fibrillation ([place]) 12/16/2016   CHA2DS2VASC score 3   Pneumonia    hx of     Past Surgical History:  Procedure Laterality Date   brachial skin removal due to deforimity Bilateral    BREAST ENHANCEMENT SURGERY Left 11/14/2018   Procedure: LEFT BREAST AUGMENTATION;  Surgeon: [name], MD;  Location: [place];  Service: Plastics;  Laterality: Left;   BREAST RECONSTRUCTION Right 06/26/2018    BREAST RECONSTRUCTION WITH PLACEMENT OF TISSUE EXPANDER AND ALLODERM Right 06/26/2018   Procedure: RIGHT BREAST RECONSTRUCTION WITH PLACEMENT OF TISSUE EXPANDER;  Surgeon: [name], MD;  Location: [place];  Service: Plastics;  Laterality: Right;   BREAST RECONSTRUCTION WITH PLACEMENT OF TISSUE EXPANDER AND FLEX HD (ACELLULAR HYDRATED DERMIS) Right 01/28/2017    RIGHT BREAST RECONSTRUCTION WITH PLACEMENT OF TISSUE EXPANDER AND ALLODERM;  Surgeon: [name], MD;  Location: [place];  Service: Plastics;  Laterality: Right;   BUNIONECTOMY     left    CATARACT EXTRACTION     bilaterla cataract surgery    COLONOSCOPY W/ POLYPECTOMY     JOINT REPLACEMENT     KNEE ARTHROSCOPY     left    LATISSIMUS FLAP TO BREAST Right 06/26/2018   Procedure: LATISSIMUS FLAP TO RIGHT CHEST;  Surgeon: [name], MD;  Location: [place];  Service: Plastics;  Laterality: Right;   MASTECTOMY Right    MASTECTOMY W/ SENTINEL NODE BIOPSY Right 01/28/2017   Procedure: RIGHT TOTAL MASTECTOMY WITH SENTINEL LYMPH NODE BIOPSY;  Surgeon: Excell Seltzer, MD;  Location: [place];  Service: General;  Laterality: Right;   MASTOPEXY  Left 11/14/2018   Procedure: LEFT BREAST MASTOPEXY;  Surgeon: [name], MD;  Location: [place];  Service: Plastics;  Laterality: Left;   MELANOMA EXCISION Left    polyp removed from uterus      REMOVAL OF TISSUE EXPANDER AND PLACEMENT OF IMPLANT Right 11/14/2018   Procedure: REMOVAL OF RIGHT TISSUE EXPANDER AND PLACEMENT OF SALINE IMPLANT;  Surgeon: [name], MD;  Location: [place];  Service: Plastics;  Laterality: Right;   right wrist pinning      skin cancers removed     TISSUE EXPANDER PLACEMENT Right 02/27/2018   Procedure: REMOVAL OF TISSUE EXPANDER;  Surgeon: [name], MD;  Location: [place];  Service: Plastics;  Laterality: Right;   TOTAL KNEE ARTHROPLASTY Right 01/29/2013   Procedure: RIGHT TOTAL KNEE ARTHROPLASTY;  Surgeon:  Gearlean Alf, MD;  [facility];  Service: Orthopedics;  Laterality: Right;   TOTAL KNEE ARTHROPLASTY Left 08/29/2017   Procedure: LEFT TOTAL KNEE ARTHROPLASTY;  Surgeon: Gaynelle Arabian, MD;  Location[facility];  Service: Orthopedics;  Laterality: Left;    There were no vitals filed for this visit.   Subjective Assessment - 02/04/21 1312     Subjective Pt reports that she is tired from doing exercises at home.  She does feel like the HEP is helpful.  She is not having LE pain currently. (0/10 pain LEs)    Pertinent History Bilateral TKA    Limitations Walking;House hold activities    How long can you walk comfortably? No limitation    Patient Stated Goals Improve balance to reduce falls without having to use assistive device             [facility] Adult PT Treatment/Exercise:  Therapeutic Exercise: - nu-step L4 5 m while taking subjective - Resisted cable walkout (holding handles) - 10x '@17'$ # - Sit to stand - 3x5 - (quad weakness compensation, corrected verbally) - hip abduction machine - 2x10 '@12'$ .5#  Neuromuscular re-ed: - feet together with head turns up and down and L/R 10x ea - semi tandem - 30'' bouts 1x ea - tandem - 30'' bouts 2x ea - rocker board DF/PF 20x   PT Short Term Goals - 01/27/21 1145       PT SHORT TERM GOAL #1   Title Patient will be I with initial HEP to progress with PT    Time 4    Period Weeks    Status New    Target Date 02/24/21      PT SHORT TERM GOAL #2   Title PT will review FOTO with patient by 3rd visit to understand expected progress with therapy    Time 3    Period Weeks    Status New    Target Date 02/17/21               PT Long Term Goals - 01/27/21 1223       PT LONG TERM GOAL #1   Title Patient will be I with final HEP to maintain progress from PT    Time 8    Period Weeks    Status New    Target Date 03/24/21      PT LONG TERM GOAL #2   Title Patient will report >/= 54% functional status on FOTO to indicate  improved confidence with balance and improve functional level    Baseline 48%    Time 8    Period Weeks    Status New  Target Date 03/24/21      PT LONG TERM GOAL #3   Title Patient will improve 5xSTS to </= 12 seconds to indicate improved strength and reduced risk of falls    Baseline 16 seconds    Time 8    Period Weeks    Status New    Target Date 03/24/21      PT LONG TERM GOAL #4   Title Patient will demonstrate an improved BERG balance assessment >/= 50/56 in order to indicate improved balance and reduced risk of falls    Baseline 37/56    Time 8    Period Weeks    Status New    Target Date 03/24/21      PT LONG TERM GOAL #5   Title Patient will exhibit improved TUG  to </= 9 seconds to indicate improved gait and reduced risk of falls    Baseline 12 seconds    Time 8    Period Weeks    Status New    Target Date 03/24/21      Additional Long Term Goals   Additional Long Term Goals Yes      PT LONG TERM GOAL #6   Title Patient will exhibit improved gross hip strength >/= 4/5 MMT and knee strength >/= 4+/5 MMT to improve curb negotation in community    Time 8    Period Weeks    Status New    Target Date 03/24/21                   Plan - 02/04/21 1334     Clinical Impression Statement [name] is progressing well with therapy.  She shows notable deficit in balance R LE vs L.  We concentrated on balance and hip strengthening today.  She tolerates well with fatigue but no increase in pain.  Continue per POC.    Personal Factors and Comorbidities Fitness;Time since onset of injury/illness/exacerbation;Past/Current Experience;Age    Examination-Activity Limitations Locomotion Level;Stairs;Stand;Lift;Carry    Examination-Participation Restrictions Meal Prep;Cleaning;Community Activity;Shop;Yard Work    Stability/Clinical Decision Making Stable/Uncomplicated    Rehab Potential Good    PT Frequency 1x / week    PT Duration 8 weeks    PT  Treatment/Interventions ADLs/Self Care Home Management;Gait training;Stair training;Functional mobility training;Therapeutic activities;Therapeutic exercise;Balance training;Neuromuscular re-education;Manual techniques;Passive range of motion;Dry needling;Spinal Manipulations;Joint Manipulations;Patient/family education    PT Next Visit Plan Review HEP and progress PRN, balance training progressing to unstable surface or working outside of base of support, reactive step training, step-ups or curb/stair training, generalized LE strengthening    PT [place] and Agree with Plan of Care Patient             Patient will benefit from skilled therapeutic intervention in order to improve the following deficits and impairments:  Abnormal gait, Difficulty walking, Decreased balance, Decreased strength, Decreased activity tolerance  Visit Diagnosis: Repeated falls  Muscle weakness (generalized)  Other abnormalities of gait and mobility     Problem List Patient Active Problem List   Diagnosis Date Noted   A-fib ([place]) 12/29/2020   Asthma, chronic, unspecified asthma severity, with acute exacerbation 12/29/2020   GAD (generalized anxiety disorder) 12/29/2020   History of breast cancer 12/29/2020   GERD (gastroesophageal reflux disease) 12/29/2020   Acute lower UTI 12/29/2020   Lactic acid increased 12/29/2020   Syncope 12/28/2020   Hypotension 12/28/2020   Hyponatremia 12/28/2020   History of breast cancer 06/26/2018  Genetic testing 12/24/2016   Persistent atrial fibrillation ([place]) 12/16/2016   Long term current use of anticoagulant therapy 12/16/2016   Hypertensive heart disease without CHF    Family history of breast cancer    Melanoma ([place])    Malignant neoplasm of upper-inner quadrant of right breast in female, estrogen receptor positive ([place]) 11/19/2016   OA (osteoarthritis) of knee 01/29/2013    Shearon Balo PT, DPT 02/04/21 1:49  PM  [facility] [address] [place], [place], [address] Phone: [phone]   Fax:  [phone]  Name: EMERCYN PARFITT MRN: [id number] Date of Birth: 07/30/1940

## 2021-02-11 ENCOUNTER — Other Ambulatory Visit: Payer: Self-pay

## 2021-02-11 ENCOUNTER — Encounter: Payer: Self-pay | Admitting: Physical Therapy

## 2021-02-11 ENCOUNTER — Ambulatory Visit: Payer: Medicare Other | Attending: Family Medicine | Admitting: Physical Therapy

## 2021-02-11 DIAGNOSIS — M6281 Muscle weakness (generalized): Secondary | ICD-10-CM | POA: Insufficient documentation

## 2021-02-11 DIAGNOSIS — R2689 Other abnormalities of gait and mobility: Secondary | ICD-10-CM | POA: Diagnosis present

## 2021-02-11 DIAGNOSIS — R296 Repeated falls: Secondary | ICD-10-CM | POA: Insufficient documentation

## 2021-02-11 NOTE — Therapy (Signed)
[place], [place], [address] Phone: [phone]   Fax:  [phone]  Physical Therapy Treatment  Patient Details  Name: [name] MRN: [id number] Date of Birth: [birth year] Referring Provider (PT): Mariel Aloe, MD   Encounter Date: 02/11/2021   PT End of Session - 02/11/21 1223     Visit Number 3    Number of Visits 8    Date for PT Re-Evaluation 03/24/21    Authorization Type UHC MCR    Progress Note Due on Visit 10    PT Start Time 1222    PT Stop Time 1300    PT Time Calculation (min) 38 min    Activity Tolerance Patient tolerated treatment well    Behavior During Therapy [facility] for tasks assessed/performed             Past Medical History:  Diagnosis Date   A-fib ([place])    Anemia    history of   Anxiety    Arthritis    Asthma    Asthma    Breast cancer ([place])    Cancer ([place])    skin cancers, one melanoma    Complication of anesthesia    patient woke up during colonoscopy   Concussion    8 yrs. 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    Keratosis, actinic    Melanoma ([place])    Persistent atrial fibrillation ([place]) 12/16/2016   CHA2DS2VASC score 3   Pneumonia    hx of     Past Surgical History:  Procedure Laterality Date   brachial skin removal due to deforimity Bilateral    BREAST ENHANCEMENT SURGERY Left 11/14/2018   Procedure: LEFT BREAST AUGMENTATION;  Surgeon: [name], MD;  Location: [place];  Service: Plastics;  Laterality: Left;   BREAST RECONSTRUCTION Right 06/26/2018    BREAST RECONSTRUCTION WITH PLACEMENT OF TISSUE EXPANDER AND ALLODERM Right 06/26/2018   Procedure: RIGHT BREAST RECONSTRUCTION WITH PLACEMENT OF TISSUE EXPANDER;  Surgeon: [name], MD;  Location: [place];  Service: Plastics;  Laterality: Right;   BREAST RECONSTRUCTION WITH PLACEMENT OF TISSUE EXPANDER AND FLEX HD (ACELLULAR HYDRATED DERMIS) Right 01/28/2017    RIGHT BREAST RECONSTRUCTION WITH PLACEMENT OF TISSUE EXPANDER AND ALLODERM;  Surgeon: [name], MD;  Location: [place];  Service: Plastics;  Laterality: Right;   BUNIONECTOMY     left    CATARACT EXTRACTION     bilaterla cataract surgery    COLONOSCOPY W/ POLYPECTOMY     JOINT REPLACEMENT     KNEE ARTHROSCOPY     left    LATISSIMUS FLAP TO BREAST Right 06/26/2018   Procedure: LATISSIMUS FLAP TO RIGHT CHEST;  Surgeon: [name], MD;  Location: [place];  Service: Plastics;  Laterality: Right;   MASTECTOMY Right    MASTECTOMY W/ SENTINEL NODE BIOPSY Right 01/28/2017   Procedure: RIGHT TOTAL MASTECTOMY WITH SENTINEL LYMPH NODE BIOPSY;  Surgeon: Excell Seltzer, MD;  Location: [place];  Service: General;  Laterality: Right;   MASTOPEXY  Left 11/14/2018   Procedure: LEFT BREAST MASTOPEXY;  Surgeon: [name], MD;  Location: [place];  Service: Plastics;  Laterality: Left;   MELANOMA EXCISION Left    polyp removed from uterus      REMOVAL OF TISSUE EXPANDER AND PLACEMENT OF IMPLANT Right 11/14/2018   Procedure: REMOVAL OF RIGHT TISSUE EXPANDER AND PLACEMENT OF SALINE IMPLANT;  Surgeon: [name], MD;  Location: [place];  Service: Plastics;  Laterality: Right;   right wrist pinning      skin cancers removed     TISSUE EXPANDER PLACEMENT Right 02/27/2018   Procedure: REMOVAL OF TISSUE EXPANDER;  Surgeon: [name], MD;  Location: [place];  Service: Plastics;  Laterality: Right;   TOTAL KNEE ARTHROPLASTY Right 01/29/2013   Procedure: RIGHT TOTAL KNEE ARTHROPLASTY;  Surgeon:  Gearlean Alf, MD;  [facility];  Service: Orthopedics;  Laterality: Right;   TOTAL KNEE ARTHROPLASTY Left 08/29/2017   Procedure: LEFT TOTAL KNEE ARTHROPLASTY;  Surgeon: Gaynelle Arabian, MD;  [facility];  Service: Orthopedics;  Laterality: Left;    There were no vitals filed for this visit.   Subjective Assessment - 02/11/21 1227     Subjective Pt reports that she is feeling a little off today.  Her husband is currently in the hospital and she has not been sleeping well.  She has been somewhat HEP compliant. Pt reports that she was very tired after last visit. (0/10 pain LEs)    Pertinent History Bilateral TKA    Limitations Walking;House hold activities    How long can you walk comfortably? No limitation    Patient Stated Goals Improve balance to reduce falls without having to use assistive device              [facility] Adult PT Treatment/Exercise:   Therapeutic Exercise to improve LE strength to reduce fall risk.  Pt form corrected througout: - nu-step L5 5 m while taking subjective - Resisted cable walkout (holding handles) - 2x5x '@20'$ # - Sit to stand - 3x6 - (quad weakness compensation, corrected verbally) - hip abduction machine - 2x10 '@12'$ .5#    Neuromuscular re-ed: - feet together with head turns up and down and L/R 10x ea - on foam - semi tandem - 30'' bouts 1x ea - tandem - 30'' bouts 2x ea (not today) - rocker board DF/PF 20x   PT Short Term Goals - 01/27/21 1145       PT SHORT TERM GOAL #1   Title Patient will be I with initial HEP to progress with PT    Time 4    Period Weeks    Status New    Target Date 02/24/21      PT SHORT TERM GOAL #2   Title PT will review FOTO with patient by 3rd visit to understand expected progress with therapy    Time 3    Period Weeks    Status New    Target Date 02/17/21               PT Long Term Goals - 01/27/21 1223       PT LONG TERM GOAL #1   Title Patient will be I with final HEP to maintain progress  from PT    Time 8    Period Weeks    Status New    Target Date 03/24/21      PT LONG TERM GOAL #2   Title Patient will report >/= 54%  functional status on FOTO to indicate improved confidence with balance and improve functional level    Baseline 48%    Time 8    Period Weeks    Status New    Target Date 03/24/21      PT LONG TERM GOAL #3   Title Patient will improve 5xSTS to </= 12 seconds to indicate improved strength and reduced risk of falls    Baseline 16 seconds    Time 8    Period Weeks    Status New    Target Date 03/24/21      PT LONG TERM GOAL #4   Title Patient will demonstrate an improved BERG balance assessment >/= 50/56 in order to indicate improved balance and reduced risk of falls    Baseline 37/56    Time 8    Period Weeks    Status New    Target Date 03/24/21      PT LONG TERM GOAL #5   Title Patient will exhibit improved TUG  to </= 9 seconds to indicate improved gait and reduced risk of falls    Baseline 12 seconds    Time 8    Period Weeks    Status New    Target Date 03/24/21      Additional Long Term Goals   Additional Long Term Goals Yes      PT LONG TERM GOAL #6   Title Patient will exhibit improved gross hip strength >/= 4/5 MMT and knee strength >/= 4+/5 MMT to improve curb negotation in community    Time 8    Period Weeks    Status New    Target Date 03/24/21                   Plan - 02/11/21 1252     Clinical Impression Statement Pt reports no increase in baseline pain following therapy  HEP was reviewed, but left unchanged    Overall, [name] is progressing well with therapy.  Today we concentrated on lower extremity strengthening and balance/proprioception.  Pt was significantly fatigued after last visit so exercise intensity was kept roughly the same.  Balance does show improvement on foam today.  Pt will continue to benefit from skilled physical therapy to address remaining deficits and achieve listed goals.   Continue per POC.    Personal Factors and Comorbidities Fitness;Time since onset of injury/illness/exacerbation;Past/Current Experience;Age    Examination-Activity Limitations Locomotion Level;Stairs;Stand;Lift;Carry    Examination-Participation Restrictions Meal Prep;Cleaning;Community Activity;Shop;Yard Work    Stability/Clinical Decision Making Stable/Uncomplicated    Rehab Potential Good    PT Frequency 1x / week    PT Duration 8 weeks    PT Treatment/Interventions ADLs/Self Care Home Management;Gait training;Stair training;Functional mobility training;Therapeutic activities;Therapeutic exercise;Balance training;Neuromuscular re-education;Manual techniques;Passive range of motion;Dry needling;Spinal Manipulations;Joint Manipulations;Patient/family education    PT Next Visit Plan Review HEP and progress PRN, balance training progressing to unstable surface or working outside of base of support, reactive step training, step-ups or curb/stair training, generalized LE strengthening    PT [place] and Agree with Plan of Care Patient             Patient will benefit from skilled therapeutic intervention in order to improve the following deficits and impairments:  Abnormal gait, Difficulty walking, Decreased balance, Decreased strength, Decreased activity tolerance  Visit Diagnosis: Repeated falls  Muscle weakness (generalized)  Other abnormalities of gait and mobility     Problem List  Patient Active Problem List   Diagnosis Date Noted   A-fib ([place]) 12/29/2020   Asthma, chronic, unspecified asthma severity, with acute exacerbation 12/29/2020   GAD (generalized anxiety disorder) 12/29/2020   History of breast cancer 12/29/2020   GERD (gastroesophageal reflux disease) 12/29/2020   Acute lower UTI 12/29/2020   Lactic acid increased 12/29/2020   Syncope 12/28/2020   Hypotension 12/28/2020   Hyponatremia 12/28/2020   History of breast cancer  06/26/2018   Genetic testing 12/24/2016   Persistent atrial fibrillation ([place]) 12/16/2016   Long term current use of anticoagulant therapy 12/16/2016   Hypertensive heart disease without CHF    Family history of breast cancer    Melanoma ([place])    Malignant neoplasm of upper-inner quadrant of right breast in female, estrogen receptor positive ([place]) 11/19/2016   OA (osteoarthritis) of knee 01/29/2013    Shearon Balo PT, DPT 02/11/21 1:05 PM  [place] [facility] [address] [place], [place], [address] Phone: [phone]   Fax:  [phone]  Name: [name] MRN: [id number] Date of Birth: 1941/01/11

## 2021-02-13 ENCOUNTER — Other Ambulatory Visit: Payer: Self-pay | Admitting: Oncology

## 2021-02-13 DIAGNOSIS — Z1231 Encounter for screening mammogram for malignant neoplasm of breast: Secondary | ICD-10-CM

## 2021-02-18 ENCOUNTER — Encounter: Payer: Medicare Other | Admitting: Physical Therapy

## 2021-02-20 ENCOUNTER — Ambulatory Visit: Payer: Medicare Other | Admitting: Physical Therapy

## 2021-02-20 ENCOUNTER — Other Ambulatory Visit: Payer: Self-pay

## 2021-02-20 ENCOUNTER — Encounter: Payer: Self-pay | Admitting: Physical Therapy

## 2021-02-20 DIAGNOSIS — R296 Repeated falls: Secondary | ICD-10-CM

## 2021-02-20 DIAGNOSIS — M6281 Muscle weakness (generalized): Secondary | ICD-10-CM

## 2021-02-20 DIAGNOSIS — R2689 Other abnormalities of gait and mobility: Secondary | ICD-10-CM

## 2021-02-20 NOTE — Therapy (Signed)
[place], [place], [address] Phone: [phone]   Fax:  [phone]  Physical Therapy Treatment  Patient Details  Name: [name] MRN: [id number] Date of Birth: 07-Jan-1941 Referring Provider (PT): Mariel Aloe, MD   Encounter Date: 02/20/2021   PT End of Session - 02/20/21 1059     Visit Number 4    Number of Visits 8    Date for PT Re-Evaluation 03/24/21    Authorization Type UHC MCR    Progress Note Due on Visit 10    PT Start Time 1046    PT Stop Time 1130    PT Time Calculation (min) 44 min    Activity Tolerance Patient tolerated treatment well    Behavior During Therapy [facility] for tasks assessed/performed             Past Medical History:  Diagnosis Date   A-fib ([place])    Anemia    history of   Anxiety    Arthritis    Asthma    Asthma    Breast cancer ([place])    Cancer ([place])    skin cancers, one melanoma    Complication of anesthesia    patient woke up during colonoscopy   Concussion    8 yrs. 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    Keratosis, actinic    Melanoma ([place])    Persistent atrial fibrillation ([place]) 12/16/2016   CHA2DS2VASC score 3   Pneumonia    hx of     Past Surgical History:  Procedure Laterality Date   brachial skin removal due to deforimity Bilateral    BREAST ENHANCEMENT SURGERY Left 11/14/2018   Procedure: LEFT BREAST AUGMENTATION;  Surgeon: [name], MD;  Location: [place];  Service: Plastics;  Laterality: Left;   BREAST RECONSTRUCTION Right 06/26/2018    BREAST RECONSTRUCTION WITH PLACEMENT OF TISSUE EXPANDER AND ALLODERM Right 06/26/2018   Procedure: RIGHT BREAST RECONSTRUCTION WITH PLACEMENT OF TISSUE EXPANDER;  Surgeon: [name], MD;  Location: [place];  Service: Plastics;  Laterality: Right;   BREAST RECONSTRUCTION WITH PLACEMENT OF TISSUE EXPANDER AND FLEX HD (ACELLULAR HYDRATED DERMIS) Right 01/28/2017    RIGHT BREAST RECONSTRUCTION WITH PLACEMENT OF TISSUE EXPANDER AND ALLODERM;  Surgeon: [name], MD;  Location: [place];  Service: Plastics;  Laterality: Right;   BUNIONECTOMY     left    CATARACT EXTRACTION     bilaterla cataract surgery    COLONOSCOPY W/ POLYPECTOMY     JOINT REPLACEMENT     KNEE ARTHROSCOPY     left    LATISSIMUS FLAP TO BREAST Right 06/26/2018   Procedure: LATISSIMUS FLAP TO RIGHT CHEST;  Surgeon: [name], MD;  Location: [place];  Service: Plastics;  Laterality: Right;   MASTECTOMY Right    MASTECTOMY W/ SENTINEL NODE BIOPSY Right 01/28/2017   Procedure: RIGHT TOTAL MASTECTOMY WITH SENTINEL LYMPH NODE BIOPSY;  Surgeon: Excell Seltzer, MD;  Location: [place];  Service: General;  Laterality: Right;   MASTOPEXY  Left 11/14/2018   Procedure: LEFT BREAST MASTOPEXY;  Surgeon: [name], MD;  Location: [place];  Service: Plastics;  Laterality: Left;   MELANOMA EXCISION Left    polyp removed from uterus      REMOVAL OF TISSUE EXPANDER AND PLACEMENT OF IMPLANT Right 11/14/2018   Procedure: REMOVAL OF RIGHT TISSUE EXPANDER AND PLACEMENT OF SALINE IMPLANT;  Surgeon: [name], MD;  Location: [place];  Service: Plastics;  Laterality: Right;   right wrist pinning      skin cancers removed     TISSUE EXPANDER PLACEMENT Right 02/27/2018   Procedure: REMOVAL OF TISSUE EXPANDER;  Surgeon: [name], MD;  Location: [place];  Service: Plastics;  Laterality: Right;   TOTAL KNEE ARTHROPLASTY Right 01/29/2013   Procedure: RIGHT TOTAL KNEE ARTHROPLASTY;  Surgeon:  Gearlean Alf, MD;  [facility];  Service: Orthopedics;  Laterality: Right;   TOTAL KNEE ARTHROPLASTY Left 08/29/2017   Procedure: LEFT TOTAL KNEE ARTHROPLASTY;  Surgeon: Gaynelle Arabian, MD;  Location[facility];  Service: Orthopedics;  Laterality: Left;    There were no vitals filed for this visit.   Subjective Assessment - 02/20/21 1058     Subjective Patient reports she is doing well. Denies any pain this visit. She reports improved consistency with HEP. No falls since start of therapy.    Patient Stated Goals Improve balance to reduce falls without having to use assistive device    Currently in Pain? No/denies                [facility] PT Assessment - 02/20/21 0001       Standardized Balance Assessment   Five times sit to stand comments  14                           [facility] Adult PT Treatment/Exercise - 02/20/21 0001       Exercises   Exercises Knee/Hip            [facility] Adult PT Treatment/Exercise:   Therapeutic Exercise: - Nu-step L5 x 5 min while taking subjective - Sit<>stand 3 x 10 without UE, last set performed holding 5# weight at chest - LAQ with 3# 3 x 15 - Resisted cable backward (13#) and lateral walking (7#) at FM using belt 2 x 5 each - Heel raises 2 x 20    Neuromuscular re-ed: - Romberg stance on Airex with head turns and nods 2 x 10 each - Romberg stance on Airex with fwd reach cone tap at counter 2 x 10 alternating hands - Semi-tandem stance on Airex 2 x 20 sec - Tandem stance 2 x 20 sec         PT Education - 02/20/21 1059     Education Details HEP update, FOTO    Person(s) Educated Patient    Methods Explanation;Demonstration;Verbal cues;Handout    Comprehension Verbalized understanding;Returned demonstration;Verbal cues required;Need further instruction              PT Short Term Goals - 02/20/21 1101       PT SHORT TERM GOAL #1   Title Patient will be I with initial HEP to progress with PT    Time 4     Period Weeks    Status On-going    Target Date 02/24/21      PT SHORT TERM GOAL #2   Title PT will review FOTO with patient by 3rd visit to  understand expected progress with therapy    Baseline reviewed on 4th visit    Time 3    Period Weeks    Status Achieved    Target Date 02/17/21               PT Long Term Goals - 01/27/21 1223       PT LONG TERM GOAL #1   Title Patient will be I with final HEP to maintain progress from PT    Time 8    Period Weeks    Status New    Target Date 03/24/21      PT LONG TERM GOAL #2   Title Patient will report >/= 54% functional status on FOTO to indicate improved confidence with balance and improve functional level    Baseline 48%    Time 8    Period Weeks    Status New    Target Date 03/24/21      PT LONG TERM GOAL #3   Title Patient will improve 5xSTS to </= 12 seconds to indicate improved strength and reduced risk of falls    Baseline 16 seconds    Time 8    Period Weeks    Status New    Target Date 03/24/21      PT LONG TERM GOAL #4   Title Patient will demonstrate an improved BERG balance assessment >/= 50/56 in order to indicate improved balance and reduced risk of falls    Baseline 37/56    Time 8    Period Weeks    Status New    Target Date 03/24/21      PT LONG TERM GOAL #5   Title Patient will exhibit improved TUG  to </= 9 seconds to indicate improved gait and reduced risk of falls    Baseline 12 seconds    Time 8    Period Weeks    Status New    Target Date 03/24/21      Additional Long Term Goals   Additional Long Term Goals Yes      PT LONG TERM GOAL #6   Title Patient will exhibit improved gross hip strength >/= 4/5 MMT and knee strength >/= 4+/5 MMT to improve curb negotation in community    Time 8    Period Weeks    Status New    Target Date 03/24/21                   Plan - 02/20/21 1100     Clinical Impression Statement Patient tolerated therapy well with no adverse effects.  Therapy continued to progress strength and balance training. No pain reported with therapy but patient did report fatigue. She was able to progress with strengthening and did demonstrate improved ability with balance on unstable surface with reach outside of BOS, but does continue to exhibit increase sway. Patient would benefit from continued skilled PT to improve confidence with ambulation and reduce her risk of falls in order to return to prior activities such as yardwork.    PT Treatment/Interventions ADLs/Self Care Home Management;Gait training;Stair training;Functional mobility training;Therapeutic activities;Therapeutic exercise;Balance training;Neuromuscular re-education;Manual techniques;Passive range of motion;Dry needling;Spinal Manipulations;Joint Manipulations;Patient/family education    PT Next Visit Plan Review HEP and progress PRN, balance training progressing to unstable surface or working outside of base of support, reactive step training, step-ups or curb/stair training, generalized LE strengthening    PT [place] and Agree with Plan of Care Patient  Patient will benefit from skilled therapeutic intervention in order to improve the following deficits and impairments:  Abnormal gait, Difficulty walking, Decreased balance, Decreased strength, Decreased activity tolerance  Visit Diagnosis: Repeated falls  Muscle weakness (generalized)  Other abnormalities of gait and mobility     Problem List Patient Active Problem List   Diagnosis Date Noted   A-fib ([place]) 12/29/2020   Asthma, chronic, unspecified asthma severity, with acute exacerbation 12/29/2020   GAD (generalized anxiety disorder) 12/29/2020   History of breast cancer 12/29/2020   GERD (gastroesophageal reflux disease) 12/29/2020   Acute lower UTI 12/29/2020   Lactic acid increased 12/29/2020   Syncope 12/28/2020   Hypotension 12/28/2020   Hyponatremia 12/28/2020    History of breast cancer 06/26/2018   Genetic testing 12/24/2016   Persistent atrial fibrillation ([place]) 12/16/2016   Long term current use of anticoagulant therapy 12/16/2016   Hypertensive heart disease without CHF    Family history of breast cancer    Melanoma ([place])    Malignant neoplasm of upper-inner quadrant of right breast in female, estrogen receptor positive ([place]) 11/19/2016   OA (osteoarthritis) of knee 01/29/2013    Hilda Blades, PT, DPT, LAT, ATC 02/20/21  11:32 AM Phone: [phone] Fax: [facility] [address] [place], [place], [address] Phone: [phone]   Fax:  [phone]  Name: [name] MRN: [id number] Date of Birth: 10/18/1940

## 2021-02-23 ENCOUNTER — Ambulatory Visit: Payer: Medicare Other | Admitting: Physical Therapy

## 2021-02-23 ENCOUNTER — Encounter: Payer: Self-pay | Admitting: Physical Therapy

## 2021-02-23 ENCOUNTER — Other Ambulatory Visit: Payer: Self-pay

## 2021-02-23 VITALS — BP 188/104

## 2021-02-23 DIAGNOSIS — R2689 Other abnormalities of gait and mobility: Secondary | ICD-10-CM

## 2021-02-23 DIAGNOSIS — R296 Repeated falls: Secondary | ICD-10-CM | POA: Diagnosis not present

## 2021-02-23 DIAGNOSIS — M6281 Muscle weakness (generalized): Secondary | ICD-10-CM

## 2021-02-23 NOTE — Therapy (Signed)
[place], [place], [address] Phone: [phone]   Fax:  [phone]  Physical Therapy Treatment  Patient Details  Name: [name] MRN: [id number] Date of Birth: [birth year] Referring Provider (PT): Mariel Aloe, MD   Encounter Date: 02/23/2021   PT End of Session - 02/23/21 1539     Visit Number 5    Number of Visits 8    Date for PT Re-Evaluation 03/24/21    Authorization Type UHC MCR    Authorization Time Period FOTO by 6th    Progress Note Due on Visit 10    PT Start Time 1533    PT Stop Time 1548    PT Time Calculation (min) 15 min    Activity Tolerance Treatment limited secondary to medical complications (Comment)   Elevated BP   Behavior During Therapy [facility] for tasks assessed/performed             Past Medical History:  Diagnosis Date   A-fib ([place])    Anemia    history of   Anxiety    Arthritis    Asthma    Asthma    Breast cancer ([place])    Cancer ([place])    skin cancers, one melanoma    Complication of anesthesia    patient woke up during colonoscopy   Concussion    8 yrs. 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    Keratosis, actinic    Melanoma ([place])    Persistent atrial fibrillation ([place]) 12/16/2016   CHA2DS2VASC score 3   Pneumonia    hx of     Past Surgical History:  Procedure Laterality Date   brachial skin removal due to deforimity Bilateral    BREAST ENHANCEMENT SURGERY Left 11/14/2018   Procedure: LEFT BREAST AUGMENTATION;  Surgeon: [name], MD;  Location: [place];   Service: Plastics;  Laterality: Left;   BREAST RECONSTRUCTION Right 06/26/2018   BREAST RECONSTRUCTION WITH PLACEMENT OF TISSUE EXPANDER AND ALLODERM Right 06/26/2018   Procedure: RIGHT BREAST RECONSTRUCTION WITH PLACEMENT OF TISSUE EXPANDER;  Surgeon: [name], MD;  Location: [place];  Service: Plastics;  Laterality: Right;   BREAST RECONSTRUCTION WITH PLACEMENT OF TISSUE EXPANDER AND FLEX HD (ACELLULAR HYDRATED DERMIS) Right 01/28/2017    RIGHT BREAST RECONSTRUCTION WITH PLACEMENT OF TISSUE EXPANDER AND ALLODERM;  Surgeon: [name], MD;  Location: [place];  Service: Plastics;  Laterality: Right;   BUNIONECTOMY     left    CATARACT EXTRACTION     bilaterla cataract surgery    COLONOSCOPY W/ POLYPECTOMY     JOINT REPLACEMENT     KNEE ARTHROSCOPY     left    LATISSIMUS FLAP TO BREAST Right 06/26/2018   Procedure: LATISSIMUS FLAP TO RIGHT CHEST;  Surgeon: [name], MD;  Location: [place];  Service: Plastics;  Laterality: Right;   MASTECTOMY Right    MASTECTOMY W/ SENTINEL NODE BIOPSY Right 01/28/2017   Procedure: RIGHT TOTAL MASTECTOMY WITH SENTINEL LYMPH NODE BIOPSY;  Surgeon: Excell Seltzer,  Marland Kitchen, MD;  Location: [place];  Service: General;  Laterality: Right;   MASTOPEXY Left 11/14/2018   Procedure: LEFT BREAST MASTOPEXY;  Surgeon: [name], MD;  Location: [place];  Service: Plastics;  Laterality: Left;   MELANOMA EXCISION Left    polyp removed from uterus      REMOVAL OF TISSUE EXPANDER AND PLACEMENT OF IMPLANT Right 11/14/2018   Procedure: REMOVAL OF RIGHT TISSUE EXPANDER AND PLACEMENT OF SALINE IMPLANT;  Surgeon: [name], MD;  Location: [place];  Service: Plastics;  Laterality: Right;   right wrist pinning      skin cancers removed     TISSUE EXPANDER PLACEMENT Right 02/27/2018   Procedure: REMOVAL OF TISSUE EXPANDER;  Surgeon: [name], MD;  Location: [place];  Service: Plastics;  Laterality: Right;   TOTAL KNEE  ARTHROPLASTY Right 01/29/2013   Procedure: RIGHT TOTAL KNEE ARTHROPLASTY;  Surgeon: Gearlean Alf, MD;  [facility];  Service: Orthopedics;  Laterality: Right;   TOTAL KNEE ARTHROPLASTY Left 08/29/2017   Procedure: LEFT TOTAL KNEE ARTHROPLASTY;  Surgeon: Gaynelle Arabian, MD;  [facility];  Service: Orthopedics;  Laterality: Left;    Vitals:   02/23/21 1603  BP: (!) 188/104     Subjective Assessment - 02/23/21 1537     Subjective Patient reports she is doing well with no new issues. Denies any pain this visit, states she has not been as consistent with her HEP. She does report feeling stronger in her legs and more balanced overall.    Patient Stated Goals Improve balance to reduce falls without having to use assistive device    Currently in Pain? No/denies                               [facility] Adult PT Treatment/Exercise - 02/23/21 0001       Self-Care   Self-Care Other Self-Care Comments    Other Self-Care Comments  BP findings and continued measurement at home, contacting PCP based on BP measurements, HEP consistency as toelrated      Exercises   Exercises Knee/Hip      Knee/Hip Exercises: Aerobic   Nustep L5 x 5 min with UE/LE while taking subjective                     PT Education - 02/23/21 1539     Education Details HEP, checking BP at home and followinging up with PCP as needed    Person(s) Educated Patient    Methods Explanation;Demonstration;Verbal cues    Comprehension Verbalized understanding;Returned demonstration;Verbal cues required;Need further instruction              PT Short Term Goals - 02/20/21 1101       PT SHORT TERM GOAL #1   Title Patient will be I with initial HEP to progress with PT    Time 4    Period Weeks    Status On-going    Target Date 02/24/21      PT SHORT TERM GOAL #2   Title PT will review FOTO with patient by 3rd visit to understand expected progress with therapy    Baseline reviewed  on 4th visit    Time 3    Period Weeks    Status Achieved    Target Date 02/17/21               PT Long Term Goals -  01/27/21 1223       PT LONG TERM GOAL #1   Title Patient will be I with final HEP to maintain progress from PT    Time 8    Period Weeks    Status New    Target Date 03/24/21      PT LONG TERM GOAL #2   Title Patient will report >/= 54% functional status on FOTO to indicate improved confidence with balance and improve functional level    Baseline 48%    Time 8    Period Weeks    Status New    Target Date 03/24/21      PT LONG TERM GOAL #3   Title Patient will improve 5xSTS to </= 12 seconds to indicate improved strength and reduced risk of falls    Baseline 16 seconds    Time 8    Period Weeks    Status New    Target Date 03/24/21      PT LONG TERM GOAL #4   Title Patient will demonstrate an improved BERG balance assessment >/= 50/56 in order to indicate improved balance and reduced risk of falls    Baseline 37/56    Time 8    Period Weeks    Status New    Target Date 03/24/21      PT LONG TERM GOAL #5   Title Patient will exhibit improved TUG  to </= 9 seconds to indicate improved gait and reduced risk of falls    Baseline 12 seconds    Time 8    Period Weeks    Status New    Target Date 03/24/21      Additional Long Term Goals   Additional Long Term Goals Yes      PT LONG TERM GOAL #6   Title Patient will exhibit improved gross hip strength >/= 4/5 MMT and knee strength >/= 4+/5 MMT to improve curb negotation in community    Time 8    Period Weeks    Status New    Target Date 03/24/21                   Plan - 02/23/21 1540     Clinical Impression Statement Patient reported feeling winded with NuStep this visit and she stated her blood pressure was running high today BP was assessed following NuStep and her BP was 188/104. Patient was allowed to rest approximately 5 minutes and provided water. When BP was reassessed it  following rest break it was 188/103 so therapy was placed on hold for the day and patient was rescheduled. She was educated on following up with her PCP as needed based on symptoms and whether BP remained elevated, as well as re-instructed on current HEP and consistency as tolerated. Patient would benefit from continued skilled PT to improve confidence with ambulation and reduce her risk of falls in order to return to prior activities such as yardwork.    PT Treatment/Interventions ADLs/Self Care Home Management;Gait training;Stair training;Functional mobility training;Therapeutic activities;Therapeutic exercise;Balance training;Neuromuscular re-education;Manual techniques;Passive range of motion;Dry needling;Spinal Manipulations;Joint Manipulations;Patient/family education    PT Next Visit Plan Review HEP and progress PRN, balance training progressing to unstable surface or working outside of base of support, reactive step training, step-ups or curb/stair training, generalized LE strengthening    PT [place] and Agree with Plan of Care Patient             Patient will benefit  from skilled therapeutic intervention in order to improve the following deficits and impairments:  Abnormal gait, Difficulty walking, Decreased balance, Decreased strength, Decreased activity tolerance  Visit Diagnosis: Repeated falls  Muscle weakness (generalized)  Other abnormalities of gait and mobility     Problem List Patient Active Problem List   Diagnosis Date Noted   A-fib ([place]) 12/29/2020   Asthma, chronic, unspecified asthma severity, with acute exacerbation 12/29/2020   GAD (generalized anxiety disorder) 12/29/2020   History of breast cancer 12/29/2020   GERD (gastroesophageal reflux disease) 12/29/2020   Acute lower UTI 12/29/2020   Lactic acid increased 12/29/2020   Syncope 12/28/2020   Hypotension 12/28/2020   Hyponatremia 12/28/2020   History of breast cancer  06/26/2018   Genetic testing 12/24/2016   Persistent atrial fibrillation ([place]) 12/16/2016   Long term current use of anticoagulant therapy 12/16/2016   Hypertensive heart disease without CHF    Family history of breast cancer    Melanoma ([place])    Malignant neoplasm of upper-inner quadrant of right breast in female, estrogen receptor positive ([place]) 11/19/2016   OA (osteoarthritis) of knee 01/29/2013    Hilda Blades, PT, DPT, LAT, ATC 02/23/21  4:05 PM Phone: [phone] Fax: [facility] [address] [place], [place], [address] Phone: [phone]   Fax:  [phone]  Name: [name] MRN: [id number] Date of Birth: [birth year]

## 2021-02-24 ENCOUNTER — Other Ambulatory Visit: Payer: Self-pay

## 2021-02-24 ENCOUNTER — Encounter: Payer: Self-pay | Admitting: Physical Therapy

## 2021-02-24 ENCOUNTER — Ambulatory Visit: Payer: Medicare Other | Admitting: Physical Therapy

## 2021-02-24 VITALS — BP 158/94

## 2021-02-24 DIAGNOSIS — R296 Repeated falls: Secondary | ICD-10-CM

## 2021-02-24 DIAGNOSIS — M6281 Muscle weakness (generalized): Secondary | ICD-10-CM

## 2021-02-24 DIAGNOSIS — R2689 Other abnormalities of gait and mobility: Secondary | ICD-10-CM

## 2021-02-24 NOTE — Therapy (Signed)
[place], [place], [address] Phone: [phone]   Fax:  [phone]  Physical Therapy Treatment  Patient Details  Name: [name] MRN: [id number] Date of Birth: [birth year] Referring Provider (PT): Mariel Aloe, MD   Encounter Date: 02/24/2021   PT End of Session - 02/24/21 1054     Visit Number 6    Number of Visits 8    Date for PT Re-Evaluation 03/24/21    Authorization Type UHC MCR    Authorization Time Period FOTO by 10th    PT Start Time 1051    PT Stop Time 1129    PT Time Calculation (min) 38 min    Activity Tolerance Patient tolerated treatment well    Behavior During Therapy [facility] for tasks assessed/performed             Past Medical History:  Diagnosis Date   A-fib ([place])    Anemia    history of   Anxiety    Arthritis    Asthma    Asthma    Breast cancer ([place])    Cancer ([place])    skin cancers, one melanoma    Complication of anesthesia    patient woke up during colonoscopy   Concussion    8 yrs. 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    Keratosis, actinic    Melanoma ([place])    Persistent atrial fibrillation ([place]) 12/16/2016   CHA2DS2VASC score 3   Pneumonia    hx of     Past Surgical History:  Procedure Laterality Date   brachial skin removal due to deforimity Bilateral    BREAST ENHANCEMENT SURGERY Left 11/14/2018   Procedure: LEFT BREAST AUGMENTATION;  Surgeon: [name], MD;  Location: [facility];  Service: Plastics;  Laterality: Left;   BREAST RECONSTRUCTION Right  06/26/2018   BREAST RECONSTRUCTION WITH PLACEMENT OF TISSUE EXPANDER AND ALLODERM Right 06/26/2018   Procedure: RIGHT BREAST RECONSTRUCTION WITH PLACEMENT OF TISSUE EXPANDER;  Surgeon: [name], MD;  Location: [facility];  Service: Plastics;  Laterality: Right;   BREAST RECONSTRUCTION WITH PLACEMENT OF TISSUE EXPANDER AND FLEX HD (ACELLULAR HYDRATED DERMIS) Right 01/28/2017    RIGHT BREAST RECONSTRUCTION WITH PLACEMENT OF TISSUE EXPANDER AND ALLODERM;  Surgeon: [name], MD;  Location: [facility];  Service: Plastics;  Laterality: Right;   BUNIONECTOMY     left    CATARACT EXTRACTION     bilaterla cataract surgery    COLONOSCOPY W/ POLYPECTOMY     JOINT REPLACEMENT     KNEE ARTHROSCOPY     left    LATISSIMUS FLAP TO BREAST Right 06/26/2018   Procedure: LATISSIMUS FLAP TO RIGHT CHEST;  Surgeon: [name], MD;  Location: [facility];  Service: Plastics;  Laterality: Right;   MASTECTOMY Right    MASTECTOMY W/ SENTINEL NODE BIOPSY Right 01/28/2017   Procedure: RIGHT TOTAL MASTECTOMY WITH SENTINEL LYMPH NODE BIOPSY;  Surgeon: Excell Seltzer, MD;  Location: [facility];  Service: General;  Laterality: Right;   MASTOPEXY  Left 11/14/2018   Procedure: LEFT BREAST MASTOPEXY;  Surgeon: [name], MD;  Location: [facility];  Service: Plastics;  Laterality: Left;   MELANOMA EXCISION Left    polyp removed from uterus      REMOVAL OF TISSUE EXPANDER AND PLACEMENT OF IMPLANT Right 11/14/2018   Procedure: REMOVAL OF RIGHT TISSUE EXPANDER AND PLACEMENT OF SALINE IMPLANT;  Surgeon: [name], MD;  Location: [place];  Service: Plastics;  Laterality: Right;   right wrist pinning      skin cancers removed     TISSUE EXPANDER PLACEMENT Right 02/27/2018   Procedure: REMOVAL OF TISSUE EXPANDER;  Surgeon: [name], MD;  Location: [place];  Service: Plastics;  Laterality: Right;   TOTAL KNEE ARTHROPLASTY Right 01/29/2013   Procedure: RIGHT TOTAL KNEE  ARTHROPLASTY;  Surgeon: Gearlean Alf, MD;  [facility];  Service: Orthopedics;  Laterality: Right;   TOTAL KNEE ARTHROPLASTY Left 08/29/2017   Procedure: LEFT TOTAL KNEE ARTHROPLASTY;  Surgeon: Gaynelle Arabian, MD;  [facility];  Service: Orthopedics;  Laterality: Left;    Vitals:   02/24/21 1058  BP: (!) 158/94     Subjective Assessment - 02/24/21 1053     Subjective Patient reports her blood pressure has improved, and she is feeling better. She denies any pain this visit.    Patient Stated Goals Improve balance to reduce falls without having to use assistive device    Currently in Pain? No/denies                [facility] PT Assessment - 02/24/21 0001       Observation/Other Assessments   Focus on Therapeutic Outcomes (FOTO)  57% functional status                           [facility] Adult PT Treatment/Exercise - 02/24/21 0001       Exercises   Exercises Knee/Hip            [facility] Adult PT Treatment/Exercise:   Therapeutic Exercise: - Nu-step L3 x 5 min with LE while taking subjective - LAQ with 2# 2 x 20 each - SLR 2 x 10 each - Bridge 2 x 10     Neuromuscular re-ed: - Romberg stance on Airex with head turns and nods x 10 each - small range - Romberg stance on Airex with fwd reach cone tap at counter 2 x 10 alternating hands - Semi-tandem stance on Airex 2 x 20 sec - Tandem stance 3 x 20 sec   Self Care: - BP assessment and discussion - FOTO assessment and review       PT Education - 02/24/21 1054     Education Details HEP, FOTO    Person(s) Educated Patient    Methods Explanation;Demonstration;Verbal cues    Comprehension Verbalized understanding;Returned demonstration;Verbal cues required;Need further instruction              PT Short Term Goals - 02/20/21 1101       PT SHORT TERM GOAL #1   Title Patient will be I with initial HEP to progress with PT    Time 4    Period Weeks    Status On-going    Target Date  02/24/21      PT SHORT TERM GOAL #2   Title PT will review FOTO with patient by 3rd visit to understand expected progress with therapy    Baseline reviewed on 4th  visit    Time 3    Period Weeks    Status Achieved    Target Date 02/17/21               PT Long Term Goals - 02/24/21 1102       PT LONG TERM GOAL #1   Title Patient will be I with final HEP to maintain progress from PT    Time 8    Period Weeks    Status On-going    Target Date 03/24/21      PT LONG TERM GOAL #2   Title Patient will report >/= 54% functional status on FOTO to indicate improved confidence with balance and improve functional level    Baseline 57% functional status    Time 8    Period Weeks    Status Achieved      PT LONG TERM GOAL #3   Title Patient will improve 5xSTS to </= 12 seconds to indicate improved strength and reduced risk of falls    Baseline 14 seconds    Time 8    Period Weeks    Status On-going    Target Date 03/24/21      PT LONG TERM GOAL #4   Title Patient will demonstrate an improved BERG balance assessment >/= 50/56 in order to indicate improved balance and reduced risk of falls    Baseline 37/56    Time 8    Period Weeks    Status On-going    Target Date 03/24/21      PT LONG TERM GOAL #5   Title Patient will exhibit improved TUG  to </= 9 seconds to indicate improved gait and reduced risk of falls    Baseline 12 seconds    Time 8    Period Weeks    Status On-going    Target Date 03/24/21      PT LONG TERM GOAL #6   Title Patient will exhibit improved gross hip strength >/= 4/5 MMT and knee strength >/= 4+/5 MMT to improve curb negotation in community    Time 8    Period Weeks    Status On-going    Target Date 03/24/21                   Plan - 02/24/21 1057     Clinical Impression Statement Patient tolerated therapy well with no adverse effects. Therapy was regressed due to continued elevated BP. She did report improvement in her functional  status and has achieved her FOTO goal this visit. Therapy continued focus on LE strengthening and balance training, with frequent and more extended rest breaks this visit. Patient's blood pressure was elevtaed compared to what her normal reported levels are, but did not exceed levels that would contraindicate performing therapy. Patient would benefit from continued skilled PT to improve confidence with ambulation and reduce her risk of falls in order to return to prior activities such as yardwork    PT Treatment/Interventions ADLs/Self Care Home Management;Gait training;Stair training;Functional mobility training;Therapeutic activities;Therapeutic exercise;Balance training;Neuromuscular re-education;Manual techniques;Passive range of motion;Dry needling;Spinal Manipulations;Joint Manipulations;Patient/family education    PT Next Visit Plan Review HEP and progress PRN, balance training progressing to unstable surface or working outside of base of support, reactive step training, step-ups or curb/stair training, generalized LE strengthening    PT [place] and Agree with Plan of Care Patient             Patient will  benefit from skilled therapeutic intervention in order to improve the following deficits and impairments:  Abnormal gait, Difficulty walking, Decreased balance, Decreased strength, Decreased activity tolerance  Visit Diagnosis: Repeated falls  Muscle weakness (generalized)  Other abnormalities of gait and mobility     Problem List Patient Active Problem List   Diagnosis Date Noted   A-fib ([place]) 12/29/2020   Asthma, chronic, unspecified asthma severity, with acute exacerbation 12/29/2020   GAD (generalized anxiety disorder) 12/29/2020   History of breast cancer 12/29/2020   GERD (gastroesophageal reflux disease) 12/29/2020   Acute lower UTI 12/29/2020   Lactic acid increased 12/29/2020   Syncope 12/28/2020   Hypotension 12/28/2020    Hyponatremia 12/28/2020   History of breast cancer 06/26/2018   Genetic testing 12/24/2016   Persistent atrial fibrillation ([place]) 12/16/2016   Long term current use of anticoagulant therapy 12/16/2016   Hypertensive heart disease without CHF    Family history of breast cancer    Melanoma ([place])    Malignant neoplasm of upper-inner quadrant of right breast in female, estrogen receptor positive ([place]) 11/19/2016   OA (osteoarthritis) of knee 01/29/2013    Hilda Blades, PT, DPT, LAT, ATC 02/24/21  11:30 AM Phone: [phone] Fax: [facility] [address] [place], [place], [address] Phone: [phone]   Fax:  [phone]  Name: SOLEI WUBBEN MRN: [id number] Date of Birth: June 13, 1940

## 2021-02-26 NOTE — Progress Notes (Signed)
Cardiology Office Note:    Date:  02/27/2021   ID:  [name], DOB January 24, 1941, MRN [id number]  PCP:  [facility] [place] Cardiologist: Kirk Ruths, MD   Reason for visit: Follow-up  History of Present Illness:    [name] is a 80 y.o. female with a hx of atrial fibrillation, diastolic heart failure.  She has history of syncope in July 2022 with associated low blood pressure, likely exacerbated by dehydration.  Her losartan was decreased.  She saw Dr. [name] January 16, 2021 and was doing well.  Her blood pressure was running systolics 09-983.  Dr. [name] decreased her losartan 25 mg daily.  He had suggested discontinuing it but she is hesitant to do so.  He also change Lasix 20 mg to as needed.    Today, she is concerned about blood pressure spikes.  She says her blood pressure averages 140s over 80s to 90s.  But she has episodes when her blood pressure is getting to the 180s over 100s.  She worries about stroke which she has a family hx of.  When her blood pressure spikes, she takes another losartan 25 mg.  With high blood pressure, she feels shortness of breath, dizziness and heart pounding.  She wants to even out her blood pressure again.  She states she did well on amlodipine 5 mg previously.  Amlodipine was switched to losartan due to her lower extremity edema.  No further syncope; she states her syncopal episode was related to dehydration.  From a heart failure perspective, she states she has mild lower extremity edema, no PND.  She has gained about 4 pounds.  Her dry weight is 141 pounds.  She has some bloating.  From A. fib perspective, she denies palpitations.  No bleeding with Xarelto.  Past Medical History:  Diagnosis Date   A-fib ([place])    Anemia    history of   Anxiety    Arthritis    Asthma    Asthma    Breast cancer ([place])    Cancer ([place])    skin cancers, one melanoma    Complication of anesthesia    patient  woke up during colonoscopy   Concussion    8 yrs. 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    Keratosis, actinic    Melanoma ([place])    Persistent atrial fibrillation ([place]) 12/16/2016   CHA2DS2VASC score 3   Pneumonia    hx of     Past Surgical History:  Procedure Laterality Date   brachial skin removal due to deforimity Bilateral    BREAST ENHANCEMENT SURGERY Left 11/14/2018   Procedure: LEFT BREAST AUGMENTATION;  Surgeon: [name], MD;  Location: [place];  Service: Plastics;  Laterality: Left;   BREAST RECONSTRUCTION Right 06/26/2018   BREAST RECONSTRUCTION WITH PLACEMENT OF TISSUE EXPANDER AND ALLODERM Right 06/26/2018   Procedure: RIGHT BREAST RECONSTRUCTION WITH PLACEMENT OF TISSUE EXPANDER;  Surgeon: [name], MD;  Location: [place];  Service: Plastics;  Laterality: Right;  BREAST RECONSTRUCTION WITH PLACEMENT OF TISSUE EXPANDER AND FLEX HD (ACELLULAR HYDRATED DERMIS) Right 01/28/2017    RIGHT BREAST RECONSTRUCTION WITH PLACEMENT OF TISSUE EXPANDER AND ALLODERM;  Surgeon: [name], MD;  Location: [facility];  Service: Plastics;  Laterality: Right;   BUNIONECTOMY     left    CATARACT EXTRACTION     bilaterla cataract surgery    COLONOSCOPY W/ POLYPECTOMY     JOINT REPLACEMENT     KNEE ARTHROSCOPY     left    LATISSIMUS FLAP TO BREAST Right 06/26/2018   Procedure: LATISSIMUS FLAP TO RIGHT CHEST;  Surgeon: [name], MD;  Location: [facility];  Service: Plastics;  Laterality: Right;   MASTECTOMY Right    MASTECTOMY W/ SENTINEL NODE BIOPSY Right 01/28/2017   Procedure: RIGHT  TOTAL MASTECTOMY WITH SENTINEL LYMPH NODE BIOPSY;  Surgeon: Excell Seltzer, MD;  Location: [place];  Service: General;  Laterality: Right;   MASTOPEXY Left 11/14/2018   Procedure: LEFT BREAST MASTOPEXY;  Surgeon: [name], MD;  Location: [place];  Service: Plastics;  Laterality: Left;   MELANOMA EXCISION Left    polyp removed from uterus      REMOVAL OF TISSUE EXPANDER AND PLACEMENT OF IMPLANT Right 11/14/2018   Procedure: REMOVAL OF RIGHT TISSUE EXPANDER AND PLACEMENT OF SALINE IMPLANT;  Surgeon: [name], MD;  Location: [facility];  Service: Plastics;  Laterality: Right;   right wrist pinning      skin cancers removed     TISSUE EXPANDER PLACEMENT Right 02/27/2018   Procedure: REMOVAL OF TISSUE EXPANDER;  Surgeon: [name], MD;  Location: [facility];  Service: Plastics;  Laterality: Right;   TOTAL KNEE ARTHROPLASTY Right 01/29/2013   Procedure: RIGHT TOTAL KNEE ARTHROPLASTY;  Surgeon: Gearlean Alf, MD;  [facility];  Service: Orthopedics;  Laterality: Right;   TOTAL KNEE ARTHROPLASTY Left 08/29/2017   Procedure: LEFT TOTAL KNEE ARTHROPLASTY;  Surgeon: Gaynelle Arabian, MD;  [facility];  Service: Orthopedics;  Laterality: Left;    Current Medications: Current Meds  Medication Sig   albuterol (VENTOLIN HFA) 108 (90 Base) MCG/ACT inhaler Inhale 1 puff into the lungs every 6 (six) hours as needed for wheezing or shortness of breath.   ALPRAZolam (XANAX) 0.5 MG tablet Take 0.5 mg by mouth daily as needed for anxiety.    amLODipine (NORVASC) 5 MG tablet Take 1 tablet (5 mg total) by mouth daily.   B Complex Vitamins (VITAMIN B COMPLEX PO) Take 1 capsule by mouth daily.   BREO ELLIPTA 100-25 MCG/INH AEPB Inhale 1 puff into the lungs daily.   COLLAGEN PO Take 1 capsule by mouth daily.   desonide (DESOWEN) 0.05 % cream Apply 1 application topically 2 (two) times daily as needed (for skin irritation.).   diclofenac sodium (VOLTAREN) 1 %  GEL Apply 2 g topically daily as needed (pain).    EPINEPHrine 0.3 mg/0.3 mL IJ SOAJ injection Inject 0.3 mg into the muscle as needed for anaphylaxis.   famotidine (PEPCID) 20 MG tablet Take 1 tablet by mouth as directed.   fluorouracil (EFUDEX) 5 % cream Apply 1 application topically as directed. Per MD instructions, for skin cancer   fluticasone (FLONASE) 50 MCG/ACT nasal spray Place 2 sprays into both nostrils daily.   letrozole (FEMARA) 2.5 MG tablet Take 2.5 mg by mouth at bedtime.   levocetirizine (XYZAL) 5 MG tablet Take 5 mg by mouth at bedtime.   Methylsulfonylmethane (MSM PO) Take 1 capsule by  mouth daily.   MILK THISTLE PO Take 1 capsule by mouth daily.   Misc Natural Products (PYCNOGENOL COMPLEX PO) Take 1 capsule by mouth daily.   Omega-3 Fatty Acids (OMEGA 3 PO) Take 1 capsule by mouth daily.   OVER THE COUNTER MEDICATION Take 1 capsule by mouth daily. Immunity supplement   Polyethyl Glycol-Propyl Glycol (SYSTANE OP) Place 1 drop into both eyes daily.   potassium chloride (KLOR-CON) 20 MEQ packet Take 20 mEq by mouth daily as needed.   propranolol ER (INDERAL LA) 80 MG 24 hr capsule Take 80 mg by mouth daily.   TURMERIC PO Take 1 capsule by mouth daily.   venlafaxine XR (EFFEXOR-XR) 75 MG 24 hr capsule Take 75 mg by mouth daily with breakfast.   VITAMIN D PO Take 1 capsule by mouth daily.   XARELTO 20 MG TABS tablet Take 20 mg by mouth daily.   [DISCONTINUED] losartan (COZAAR) 25 MG tablet Take 1 tablet (25 mg total) by mouth daily.     Allergies:   Chlorhexidine gluconate, Doxycycline, Codeine, Doxycycline, Nsaids, Nsaids, Tylenol [acetaminophen], Adhesive [tape], Chlorhexidine, Codeine, and Tylenol [acetaminophen]   Social History   Socioeconomic History   Marital status: Married    Spouse name: Not on file   Number of children: Not on file   Years of education: Not on file   Highest education level: Not on file  Occupational History   Not on file  Tobacco Use    Smoking status: Never   Smokeless tobacco: Never  Vaping Use   Vaping Use: Never used  Substance and Sexual Activity   Alcohol use: Yes    Alcohol/week: 1.0 standard drink    Types: 1 Glasses of wine per week    Comment: occ   Drug use: No   Sexual activity: Not on file  Other Topics Concern   Not on file  Social History Narrative   ** Merged History Encounter **       Social Determinants of Health   Financial Resource Strain: Not on file  Food Insecurity: Not on file  Transportation Needs: Not on file  Physical Activity: Not on file  Stress: Not on file  Social Connections: Not on file     Family History: The patient's family history includes Breast cancer in an other family member; COPD in her father; Hypertension in an other family member.  ROS:   Please see the history of present illness.     EKGs/Labs/Other Studies Reviewed:    EKG:  The ekg ordered today demonstrates atrial fibrillation, heart rate 64, rightward axis, nonspecific ST-T wave abnormalities, QRS duration 82 ms, QT 410 ms, no significant change from prior.  Recent Labs: 04/17/2020: NT-Pro BNP 433 12/29/2020: ALT 33; BUN 9; Creatinine, Ser 0.73; Hemoglobin 12.2; Magnesium 2.3; Platelets 165; Potassium 4.2; Sodium 137; TSH 2.364  Recent Lipid Panel No results found for: CHOL, TRIG, HDL, CHOLHDL, VLDL, LDLCALC, LDLDIRECT  Physical Exam:    VS:  BP (!) 146/70   Pulse 64   Ht 5' (1.524 m)   Wt 145 lb (65.8 kg)   SpO2 96%   BMI 28.32 kg/m     Wt Readings from Last 3 Encounters:  02/27/21 145 lb (65.8 kg)  01/16/21 145 lb 12.8 oz (66.1 kg)  12/28/20 144 lb (65.3 kg)     GEN:  Well nourished, well developed in no acute distress HEENT: Normal NECK: No JVD; No carotid bruits CARDIAC: Irreg irreg RESPIRATORY:  Clear to auscultation without  rales, wheezing or rhonchi  ABDOMEN: Soft, non-tender, non-distended MUSCULOSKELETAL: 1+ edema to knees bilaterally; No deformity  SKIN: Warm and  dry NEUROLOGIC:  Alert and oriented PSYCHIATRIC:  Normal affect   ASSESSMENT AND PLAN   Syncope/orthostatic hypotension, resolved -Likely 2/2 dehydration. -Blood pressure now spiking which is causing her anxiety  Hypertension, labile -She wishes to retrial amlodipine 5 mg daily.  We will monitor lower extremity edema.  I have recommended she wear compression stockings daily.  Stop losartan. - Goal BP is <191/66 but systolic >060.  Bring blood pressure log to next appointment.  Permanent atrial fibrillation -Continue beta-blocker for rate control and Xarelto for stroke prevention.  Chronic diastolic CHF/LE edema -Echocardiogram July 2022 showed ejection fraction 55%, moderate pulmonary hypertension, mild RV dysfunction, mild right ventricular enlargement, moderate left atrial enlargement, mild right atrial enlargement, mild aortic insufficiency. -With weight gain of 4 pounds and lower extremity edema and bloating, I recommend she take Lasix 20 mg daily for 2 days and then continue as needed.  Fam hx of CVA -Check fasting lipids. -Blood pressure control. -Carotid duplex in July 2022 showed mild disease.  Disposition - Follow-up in 1 month with me to review blood pressure control and heart failure symptoms.  Medication Adjustments/Labs and Tests Ordered: Current medicines are reviewed at length with the patient today.  Concerns regarding medicines are outlined above.  Orders Placed This Encounter  Procedures   Lipid panel   EKG 12-Lead    Meds ordered this encounter  Medications   amLODipine (NORVASC) 5 MG tablet    Sig: Take 1 tablet (5 mg total) by mouth daily.    Dispense:  180 tablet    Refill:  3     Patient Instructions  Medication Instructions:  Stop Losartan. Start Amlodipine 5 mg (1 Tablet Daily). Take Lasix and Potassium for 2 (Two ) Days. Then continue As needed. *If you need a refill on your cardiac medications before your next appointment, please call your  pharmacy*   Lab Work: No Labs If you have labs (blood work) drawn today and your tests are completely normal, you will receive your results only by: [place] (if you have MyChart) OR A paper copy in the mail If you have any lab test that is abnormal or we need to change your treatment, we will call you to review the results.   Testing/Procedures: Lipid Panel (To Be done In 1 Month).   Follow-Up: At [facility], you and your health needs are our priority.  As part of our continuing mission to provide you with exceptional heart care, we have created designated Provider Care Teams.  These Care Teams include your primary Cardiologist (physician) and Advanced Practice Providers (APPs -  Physician Assistants and Nurse Practitioners) who all work together to provide you with the care you need, when you need it.   Your next appointment:   1 month(s)  The format for your next appointment:   In Person  Provider:   Caron Presume, PA-C   Other Instructions Recommending wear compression stockings during the day.    Signed, Warren Lacy, PA-C  02/27/2021 1:09 PM    [facility]

## 2021-02-27 ENCOUNTER — Other Ambulatory Visit: Payer: Self-pay

## 2021-02-27 ENCOUNTER — Ambulatory Visit (INDEPENDENT_AMBULATORY_CARE_PROVIDER_SITE_OTHER): Payer: Medicare Other | Admitting: Physician Assistant

## 2021-02-27 ENCOUNTER — Encounter: Payer: Self-pay | Admitting: Physician Assistant

## 2021-02-27 VITALS — BP 146/70 | HR 64 | Ht 60.0 in | Wt 145.0 lb

## 2021-02-27 DIAGNOSIS — I5032 Chronic diastolic (congestive) heart failure: Secondary | ICD-10-CM

## 2021-02-27 DIAGNOSIS — I10 Essential (primary) hypertension: Secondary | ICD-10-CM | POA: Diagnosis not present

## 2021-02-27 DIAGNOSIS — R55 Syncope and collapse: Secondary | ICD-10-CM | POA: Diagnosis not present

## 2021-02-27 DIAGNOSIS — I4819 Other persistent atrial fibrillation: Secondary | ICD-10-CM

## 2021-02-27 DIAGNOSIS — I5043 Acute on chronic combined systolic (congestive) and diastolic (congestive) heart failure: Secondary | ICD-10-CM | POA: Diagnosis not present

## 2021-02-27 MED ORDER — AMLODIPINE BESYLATE 5 MG PO TABS: 5.0000 mg | ORAL_TABLET | Freq: Every day | ORAL | 3 refills | Status: DC

## 2021-02-27 NOTE — Patient Instructions (Signed)
Medication Instructions:  Stop Losartan. Start Amlodipine 5 mg (1 Tablet Daily). Take Lasix and Potassium for 2 (Two ) Days. Then continue As needed. *If you need a refill on your cardiac medications before your next appointment, please call your pharmacy*   Lab Work: No Labs If you have labs (blood work) drawn today and your tests are completely normal, you will receive your results only by: [place] (if you have MyChart) OR A paper copy in the mail If you have any lab test that is abnormal or we need to change your treatment, we will call you to review the results.   Testing/Procedures: Lipid Panel (To Be done In 1 Month).   Follow-Up: At [facility], you and your health needs are our priority.  As part of our continuing mission to provide you with exceptional heart care, we have created designated Provider Care Teams.  These Care Teams include your primary Cardiologist (physician) and Advanced Practice Providers (APPs -  Physician Assistants and Nurse Practitioners) who all work together to provide you with the care you need, when you need it.   Your next appointment:   1 month(s)  The format for your next appointment:   In Person  Provider:   Caron Presume, PA-C   Other Instructions Recommending wear compression stockings during the day.

## 2021-03-03 ENCOUNTER — Other Ambulatory Visit: Payer: Self-pay

## 2021-03-03 ENCOUNTER — Inpatient Hospital Stay: Payer: Medicare Other

## 2021-03-03 ENCOUNTER — Inpatient Hospital Stay: Payer: Medicare Other | Attending: Oncology | Admitting: Oncology

## 2021-03-03 VITALS — BP 147/87 | HR 81 | Temp 97.9°F | Wt 143.3 lb

## 2021-03-03 DIAGNOSIS — Z8249 Family history of ischemic heart disease and other diseases of the circulatory system: Secondary | ICD-10-CM | POA: Insufficient documentation

## 2021-03-03 DIAGNOSIS — Z885 Allergy status to narcotic agent status: Secondary | ICD-10-CM | POA: Diagnosis not present

## 2021-03-03 DIAGNOSIS — Z886 Allergy status to analgesic agent status: Secondary | ICD-10-CM | POA: Diagnosis not present

## 2021-03-03 DIAGNOSIS — Z888 Allergy status to other drugs, medicaments and biological substances status: Secondary | ICD-10-CM | POA: Insufficient documentation

## 2021-03-03 DIAGNOSIS — I4819 Other persistent atrial fibrillation: Secondary | ICD-10-CM

## 2021-03-03 DIAGNOSIS — Z170 Estrogen receptor positive status [ER+]: Secondary | ICD-10-CM | POA: Diagnosis not present

## 2021-03-03 DIAGNOSIS — C50411 Malignant neoplasm of upper-outer quadrant of right female breast: Secondary | ICD-10-CM | POA: Diagnosis present

## 2021-03-03 DIAGNOSIS — K219 Gastro-esophageal reflux disease without esophagitis: Secondary | ICD-10-CM | POA: Insufficient documentation

## 2021-03-03 DIAGNOSIS — Z803 Family history of malignant neoplasm of breast: Secondary | ICD-10-CM | POA: Diagnosis not present

## 2021-03-03 DIAGNOSIS — Z79899 Other long term (current) drug therapy: Secondary | ICD-10-CM | POA: Diagnosis not present

## 2021-03-03 DIAGNOSIS — Z85820 Personal history of malignant melanoma of skin: Secondary | ICD-10-CM | POA: Diagnosis not present

## 2021-03-03 DIAGNOSIS — Z87442 Personal history of urinary calculi: Secondary | ICD-10-CM | POA: Insufficient documentation

## 2021-03-03 DIAGNOSIS — Z7901 Long term (current) use of anticoagulants: Secondary | ICD-10-CM | POA: Insufficient documentation

## 2021-03-03 DIAGNOSIS — Z923 Personal history of irradiation: Secondary | ICD-10-CM | POA: Insufficient documentation

## 2021-03-03 DIAGNOSIS — J45909 Unspecified asthma, uncomplicated: Secondary | ICD-10-CM | POA: Insufficient documentation

## 2021-03-03 DIAGNOSIS — C50211 Malignant neoplasm of upper-inner quadrant of right female breast: Secondary | ICD-10-CM

## 2021-03-03 DIAGNOSIS — I4891 Unspecified atrial fibrillation: Secondary | ICD-10-CM | POA: Diagnosis not present

## 2021-03-03 DIAGNOSIS — Z79811 Long term (current) use of aromatase inhibitors: Secondary | ICD-10-CM | POA: Insufficient documentation

## 2021-03-03 DIAGNOSIS — Z836 Family history of other diseases of the respiratory system: Secondary | ICD-10-CM | POA: Diagnosis not present

## 2021-03-03 DIAGNOSIS — Z881 Allergy status to other antibiotic agents status: Secondary | ICD-10-CM | POA: Insufficient documentation

## 2021-03-03 LAB — COMPREHENSIVE METABOLIC PANEL
ALT: 33 U/L (ref 0–44)
AST: 66 U/L — ABNORMAL HIGH (ref 15–41)
Albumin: 3.7 g/dL (ref 3.5–5.0)
Alkaline Phosphatase: 164 U/L — ABNORMAL HIGH (ref 38–126)
Anion gap: 14 (ref 5–15)
BUN: 10 mg/dL (ref 8–23)
CO2: 29 mmol/L (ref 22–32)
Calcium: 9.8 mg/dL (ref 8.9–10.3)
Chloride: 93 mmol/L — ABNORMAL LOW (ref 98–111)
Creatinine, Ser: 0.75 mg/dL (ref 0.44–1.00)
GFR, Estimated: >60 mL/min (ref >60)
Glucose, Bld: 107 mg/dL — ABNORMAL HIGH (ref 70–99)
Potassium: 3.8 mmol/L (ref 3.5–5.1)
Sodium: 136 mmol/L (ref 135–145)
Total Bilirubin: 1.6 mg/dL — ABNORMAL HIGH (ref 0.3–1.2)
Total Protein: 7.5 g/dL (ref 6.5–8.1)

## 2021-03-03 LAB — CBC WITH DIFFERENTIAL/PLATELET
Abs Immature Granulocytes: 0.02 10*3/uL (ref 0.00–0.07)
Basophils Absolute: 0.1 10*3/uL (ref 0.0–0.1)
Basophils Relative: 1 %
Eosinophils Absolute: 0.1 10*3/uL (ref 0.0–0.5)
Eosinophils Relative: 2 %
HCT: 41.6 % (ref 36.0–46.0)
Hemoglobin: 14.1 g/dL (ref 12.0–15.0)
Immature Granulocytes: 0 %
Lymphocytes Relative: 18 %
Lymphs Abs: 1.3 10*3/uL (ref 0.7–4.0)
MCH: 35.5 pg — ABNORMAL HIGH (ref 26.0–34.0)
MCHC: 33.9 g/dL (ref 30.0–36.0)
MCV: 104.8 fL — ABNORMAL HIGH (ref 80.0–100.0)
Monocytes Absolute: 1.2 10*3/uL — ABNORMAL HIGH (ref 0.1–1.0)
Monocytes Relative: 16 %
Neutro Abs: 4.5 10*3/uL (ref 1.7–7.7)
Neutrophils Relative %: 63 %
Platelets: 203 10*3/uL (ref 150–400)
RBC: 3.97 MIL/uL (ref 3.87–5.11)
RDW: 13.2 % (ref 11.5–15.5)
WBC: 7.2 10*3/uL (ref 4.0–10.5)
nRBC: 0 % (ref 0.0–0.2)

## 2021-03-03 NOTE — Progress Notes (Signed)
[place]  Telephone:([phone]) [phone] Fax:(336) [phone]     ID: [name] DOB: 03-17-1941  MR#: [id number]  PPJ#:[id number]  Patient Care Team: [facility] as PCP - General (Family Medicine) [name], Denice Bors, MD as PCP - Cardiology (Cardiology) Excell Seltzer, MD (Inactive) as Consulting Physician (General Surgery) , Virgie Dad, MD as Consulting Physician (Oncology) Gery Pray, MD as Consulting Physician (Radiation Oncology) Lorelle Gibbs, MD (Radiology) Gaynelle Arabian, MD as Consulting Physician (Orthopedic Surgery) Harold Hedge, Darrick Grinder, MD as Consulting Physician (Allergy and Immunology) Teena Irani, MD (Inactive) as Consulting Physician (Gastroenterology) [name], MD as Consulting Physician (Plastic Surgery) Pcp, No OTHER MD:  CHIEF COMPLAINT: Estrogen receptor positive breast cancer  CURRENT TREATMENT:  Letrozole   INTERVAL HISTORY: Resa is seen today for follow-up of her estrogen receptor positive breast cancer.   She continues on Letrozole.  Hot flashes are not a problem.  She does have some issues with vaginal dryness.  We discussed the pelvic rehab program today  Sharline's last bone density screening on 12/12/2020 showed a T score of -0.9, which is normal.    Since her last visit, she underwent bilateral diagnostic mammography with tomography at [facility] on 02/22/2020 showing breast density category C, no evidence of malignancy.  She is already scheduled for repeat mammography 03/18/2021  REVIEW OF SYSTEMS: Avis fell at home and actually hit her face and had to be hospitalized.  Her husband also has fallen now several times and refuses to use a cane or a walker.  They are both undergoing rehab.  She is also having her blood pressure issues managed by Dr. [name].   BREAST CANCER HISTORY: From the original intake note:  The patient has had concerns in the right breast dating  back to about 3 years when her primary care physician Dr. [name] noted a change. The breast has been followed very closely but no diagnosis was established. She was followed with annual diagnostic mammography and on 11/11/2016 bilateral diagnostic mammography with tomography at [facility] found the breast density to be category C. There was a new 3.5 cm irregular spiculated mass in the right breast upper inner quadrant. Ultrasound was obtained the same day and confirmed a 3.5 cm solid mass in the right breast upper inner quadrant, with no abnormalities noted in the right axilla.  Biopsy of the right breast mass in question 11/15/2013 showed ([id number] invasive lobular carcinoma, E-cadherin negative, grade 1 or 2, estrogen receptor 90% positive, progesterone receptor 2% positive, both with strong staining intensity, with an MIB-1 of 5, and no HER-2 amplification, the signals ratio being 1.14 and the number per cell 1.20.  Her subsequent history is as detailed below   PAST MEDICAL HISTORY: Past Medical History:  Diagnosis Date   A-fib ([place])    Anemia    history of   Anxiety    Arthritis    Asthma    Asthma    Breast cancer ([place])    Cancer ([place])    skin cancers, one melanoma    Complication of anesthesia    patient woke up during colonoscopy   Concussion    8 yrs. ago due to being thrown from a horse   Dislocated elbow 10/12/2016   left   Dysrhythmia 12/2016   A-fib   Dysrhythmia    Family history of breast cancer    GERD (gastroesophageal reflux disease)    History of kidney stones    History  of radiation therapy 03/23/17-05/04/17   right chest wall 50.4 Gy in 28 fractions, right axilla 45 Gy in 25 fractions   Hypertension    Hypertensive heart disease without CHF    Keratosis, actinic    Melanoma ([place])    Persistent atrial fibrillation ([place]) 12/16/2016   CHA2DS2VASC score 3   Pneumonia    hx of     PAST SURGICAL HISTORY: Past Surgical History:  Procedure Laterality Date    brachial skin removal due to deforimity Bilateral    BREAST ENHANCEMENT SURGERY Left 11/14/2018   Procedure: LEFT BREAST AUGMENTATION;  Surgeon: [name], MD;  Location: [place];  Service: Plastics;  Laterality: Left;   BREAST RECONSTRUCTION Right 06/26/2018   BREAST RECONSTRUCTION WITH PLACEMENT OF TISSUE EXPANDER AND ALLODERM Right 06/26/2018   Procedure: RIGHT BREAST RECONSTRUCTION WITH PLACEMENT OF TISSUE EXPANDER;  Surgeon: [name], MD;  Location: [place];  Service: Plastics;  Laterality: Right;   BREAST RECONSTRUCTION WITH PLACEMENT OF TISSUE EXPANDER AND FLEX HD (ACELLULAR HYDRATED DERMIS) Right 01/28/2017    RIGHT BREAST RECONSTRUCTION WITH PLACEMENT OF TISSUE EXPANDER AND ALLODERM;  Surgeon: [name], MD;  Location: [place];  Service: Plastics;  Laterality: Right;   BUNIONECTOMY     left    CATARACT EXTRACTION     [place] cataract surgery    COLONOSCOPY W/ POLYPECTOMY     JOINT REPLACEMENT     KNEE ARTHROSCOPY     left    LATISSIMUS FLAP TO BREAST Right 06/26/2018   Procedure: LATISSIMUS FLAP TO RIGHT CHEST;  Surgeon: [name], MD;  Location: [place];  Service: Plastics;  Laterality: Right;   MASTECTOMY Right    MASTECTOMY W/ SENTINEL NODE BIOPSY Right 01/28/2017   Procedure: RIGHT TOTAL MASTECTOMY WITH SENTINEL LYMPH NODE BIOPSY;  Surgeon: Excell Seltzer, MD;  Location: [place];  Service: General;  Laterality: Right;   MASTOPEXY Left 11/14/2018   Procedure: LEFT BREAST MASTOPEXY;  Surgeon: [name], MD;  Location: [place];  Service: Plastics;  Laterality: Left;   MELANOMA EXCISION Left    polyp removed from uterus      REMOVAL OF TISSUE EXPANDER AND PLACEMENT OF IMPLANT Right 11/14/2018   Procedure: REMOVAL OF RIGHT TISSUE EXPANDER AND PLACEMENT OF SALINE IMPLANT;  Surgeon: [name], MD;  Location: [place];  Service: Plastics;  Laterality: Right;   right wrist pinning      skin  cancers removed     TISSUE EXPANDER PLACEMENT Right 02/27/2018   Procedure: REMOVAL OF TISSUE EXPANDER;  Surgeon: [name], MD;  Location: [place];  Service: Plastics;  Laterality: Right;   TOTAL KNEE ARTHROPLASTY Right 01/29/2013   Procedure: RIGHT TOTAL KNEE ARTHROPLASTY;  Surgeon: Gearlean Alf, MD;  Location: [facility];  Service: Orthopedics;  Laterality: Right;   TOTAL KNEE ARTHROPLASTY Left 08/29/2017   Procedure: LEFT TOTAL KNEE ARTHROPLASTY;  Surgeon: Gaynelle Arabian, MD;  Location: [facility];  Service: Orthopedics;  Laterality: Left;    FAMILY HISTORY Family History  Problem Relation Age of Onset   Hypertension Other    COPD Father    Breast cancer Other        dx in her 15s-30s  The patient's father died at the age of 24 from complications of emphysema and alcohol abuse. The patient's mother died from heart disease at age 68. The patient was an only child. The only relative with breast cancer was a maternal great aunt was diagnosed in her 35s.  There is no history of ovarian cancer in the family.   GYNECOLOGIC HISTORY:  No LMP recorded. Patient is postmenopausal. Menarche age 30, first live birth age 88, she is [place] P1. She stopped having periods in 1994. She was using intravaginal Premarin until her breast cancer diagnosis June 2018. She used oral contraceptives for approximately one year remotely with no complications.   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 Her son from her first marriage, Sabino Snipes, lives in [place] and is an Sales promotion account executive. He has 2 children. The patient's second husband, [name], is a retired Film/video editor. He has a son in [place], who works as a Marine scientist. [name] has 5 grandchildren of his own. The patient is a Psychologist, forensic.    ADVANCED DIRECTIVES: In place. The patient tells me she has named her husband [name] and her son Sherren Mocha as healthcare part of attorney   HEALTH MAINTENANCE: Social History   Tobacco Use   Smoking status:  Never   Smokeless tobacco: Never  Vaping Use   Vaping Use: Never used  Substance Use Topics   Alcohol use: Yes    Alcohol/week: 1.0 standard drink    Types: 1 Glasses of wine per week    Comment: occ   Drug use: No     Colonoscopy:2015  PAP:  Bone density: 01/27/2015 at [facility] T score -0.3   Allergies  Allergen Reactions   Chlorhexidine Gluconate Hives, Swelling and Rash       "ChloraPrep "   Doxycycline Nausea And Vomiting   Codeine Nausea Only and Other (See Comments)    Pt can tolerate when pre-medicated   Doxycycline Nausea And Vomiting   Nsaids     avoid due to gerd   Nsaids Other (See Comments)    Gi bleed   Tylenol [Acetaminophen]     Avoid due to GERD   Adhesive [Tape] Rash    Dermabond   Chlorhexidine Swelling and Rash   Codeine Nausea Only   Tylenol [Acetaminophen] Other (See Comments)    GI bleed    Current Outpatient Medications  Medication Sig Dispense Refill   albuterol (VENTOLIN HFA) 108 (90 Base) MCG/ACT inhaler Inhale 1 puff into the lungs every 6 (six) hours as needed for wheezing or shortness of breath.     ALPRAZolam (XANAX) 0.5 MG tablet Take 0.5 mg by mouth daily as needed for anxiety.      amLODipine (NORVASC) 5 MG tablet Take 1 tablet (5 mg total) by mouth daily. 180 tablet 3   B Complex Vitamins (VITAMIN B COMPLEX PO) Take 1 capsule by mouth daily.     BREO ELLIPTA 100-25 MCG/INH AEPB Inhale 1 puff into the lungs daily.     COLLAGEN PO Take 1 capsule by mouth daily.     desonide (DESOWEN) 0.05 % cream Apply 1 application topically 2 (two) times daily as needed (for skin irritation.).     diclofenac sodium (VOLTAREN) 1 % GEL Apply 2 g topically daily as needed (pain).      EPINEPHrine 0.3 mg/0.3 mL IJ SOAJ injection Inject 0.3 mg into the muscle as needed for anaphylaxis.     famotidine (PEPCID) 20 MG tablet Take 1 tablet by mouth as directed.     fluorouracil (EFUDEX) 5 % cream Apply 1 application topically as directed. Per MD  instructions, for skin cancer     fluticasone (FLONASE) 50 MCG/ACT nasal spray Place 2 sprays into both nostrils daily.     furosemide (LASIX)  20 MG tablet Take 20 mg by mouth daily as needed. (Patient not taking: Reported on 02/27/2021)     letrozole (FEMARA) 2.5 MG tablet Take 2.5 mg by mouth at bedtime.     levocetirizine (XYZAL) 5 MG tablet Take 5 mg by mouth at bedtime.     Methylsulfonylmethane (MSM PO) Take 1 capsule by mouth daily.     MILK THISTLE PO Take 1 capsule by mouth daily.     Misc Natural Products (PYCNOGENOL COMPLEX PO) Take 1 capsule by mouth daily.     Omega-3 Fatty Acids (OMEGA 3 PO) Take 1 capsule by mouth daily.     OVER THE COUNTER MEDICATION Take 1 capsule by mouth daily. Immunity supplement     Polyethyl Glycol-Propyl Glycol (SYSTANE OP) Place 1 drop into both eyes daily.     potassium chloride (KLOR-CON) 20 MEQ packet Take 20 mEq by mouth daily as needed.     propranolol ER (INDERAL LA) 80 MG 24 hr capsule Take 80 mg by mouth daily.     TURMERIC PO Take 1 capsule by mouth daily.     venlafaxine XR (EFFEXOR-XR) 75 MG 24 hr capsule Take 75 mg by mouth daily with breakfast.     VITAMIN D PO Take 1 capsule by mouth daily.     XARELTO 20 MG TABS tablet Take 20 mg by mouth daily.     No current facility-administered medications for this visit.    OBJECTIVE: White woman who had difficulty getting up on the examination table Vitals:   03/03/21 1405  BP: (!) 147/87  Pulse: 81  Temp: 97.9 F (36.6 C)  SpO2: 92%      Body mass index is 27.99 kg/m.   Filed Weights   03/03/21 1405  Weight: 143 lb 4.8 oz (65 kg)     ECOG FS:1 - Symptomatic but completely ambulatory  Sclerae unicteric, EOMs intact Wearing a mask No cervical or supraclavicular adenopathy Lungs no rales or rhonchi Heart regular rate and rhythm Abd soft, nontender, no masses palpated MSK no focal spinal tenderness, no upper extremity lymphedema;  Neuro: nonfocal, well oriented, appropriate  affect Breasts: The right breast has undergone mastectomy with saline implant placement.  There is no evidence of local recurrence.  The left breast is status post implant placement.  There are no suspicious findings.  Both axillae are benign.   LAB RESULTS:  CMP     Component Value Date/Time   NA 136 03/03/2021 1337   NA 136 11/27/2020 1431   NA 134 (L) 04/25/2017 1216   K 3.8 03/03/2021 1337   K 4.3 04/25/2017 1216   CL 93 (L) 03/03/2021 1337   CO2 29 03/03/2021 1337   CO2 29 04/25/2017 1216   GLUCOSE 107 (H) 03/03/2021 1337   GLUCOSE 85 04/25/2017 1216   BUN 10 03/03/2021 1337   BUN 12 11/27/2020 1431   BUN 14.4 04/25/2017 1216   CREATININE 0.75 03/03/2021 1337   CREATININE 0.72 01/17/2018 1347   CREATININE 0.7 04/25/2017 1216   CALCIUM 9.8 03/03/2021 1337   CALCIUM 10.0 04/25/2017 1216   PROT 7.5 03/03/2021 1337   PROT 7.2 04/25/2017 1216   ALBUMIN 3.7 03/03/2021 1337   ALBUMIN 3.7 04/25/2017 1216   AST 66 (H) 03/03/2021 1337   AST 20 01/17/2018 1347   AST 21 04/25/2017 1216   ALT 33 03/03/2021 1337   ALT 10 01/17/2018 1347   ALT 10 04/25/2017 1216   ALKPHOS 164 (H) 03/03/2021 1337  ALKPHOS 75 04/25/2017 1216   BILITOT 1.6 (H) 03/03/2021 1337   BILITOT 0.5 01/17/2018 1347   BILITOT 0.65 04/25/2017 1216   GFRNONAA >60 03/03/2021 1337   GFRNONAA >60 01/17/2018 1347   GFRAA 90 04/24/2020 1235   GFRAA >60 01/17/2018 1347    No results found for: Ronnald Ramp, A1GS, A2GS, BETS, BETA2SER, GAMS, MSPIKE, SPEI  No results found for: Nils Pyle, [facility]  Lab Results  Component Value Date   WBC 7.2 03/03/2021   NEUTROABS 4.5 03/03/2021   HGB 14.1 03/03/2021   HCT 41.6 03/03/2021   MCV 104.8 (H) 03/03/2021   PLT 203 03/03/2021      Chemistry      Component Value Date/Time   NA 136 03/03/2021 1337   NA 136 11/27/2020 1431   NA 134 (L) 04/25/2017 1216   K 3.8 03/03/2021 1337   K 4.3 04/25/2017 1216   CL 93 (L) 03/03/2021 1337    CO2 29 03/03/2021 1337   CO2 29 04/25/2017 1216   BUN 10 03/03/2021 1337   BUN 12 11/27/2020 1431   BUN 14.4 04/25/2017 1216   CREATININE 0.75 03/03/2021 1337   CREATININE 0.72 01/17/2018 1347   CREATININE 0.7 04/25/2017 1216      Component Value Date/Time   CALCIUM 9.8 03/03/2021 1337   CALCIUM 10.0 04/25/2017 1216   ALKPHOS 164 (H) 03/03/2021 1337   ALKPHOS 75 04/25/2017 1216   AST 66 (H) 03/03/2021 1337   AST 20 01/17/2018 1347   AST 21 04/25/2017 1216   ALT 33 03/03/2021 1337   ALT 10 01/17/2018 1347   ALT 10 04/25/2017 1216   BILITOT 1.6 (H) 03/03/2021 1337   BILITOT 0.5 01/17/2018 1347   BILITOT 0.65 04/25/2017 1216       No results found for: LABCA2  No components found for: [id number]  No results for input(s): INR in the last 168 hours.  Urinalysis    Component Value Date/Time   COLORURINE YELLOW 12/28/2020 1641   APPEARANCEUR HAZY (A) 12/28/2020 1641   LABSPEC 1.009 12/28/2020 1641   PHURINE 6.0 12/28/2020 1641   GLUCOSEU NEGATIVE 12/28/2020 1641   HGBUR NEGATIVE 12/28/2020 [place] 12/28/2020 1641   [place] 12/28/2020 1641   PROTEINUR NEGATIVE 12/28/2020 1641   UROBILINOGEN 0.2 01/22/2013 1345   NITRITE NEGATIVE 12/28/2020 1641   LEUKOCYTESUR LARGE (A) 12/28/2020 1641    STUDIES: No results found.   ELIGIBLE FOR AVAILABLE RESEARCH PROTOCOL: no  ASSESSMENT: 80 y.o. [place] woman status post right breast upper outer quadrant biopsy 11/15/2016 for a clinical T2 N0, stage IB invasive lobular carcinoma, grade 1 or 2, estrogen and progesterone receptor positive, HER-2 nonamplified, with an MIB-1 of 5%.  (1) genetics testing 12/02/2016 through the hereditary Gene Panel offered by [facility] found no deleterious mutations in APC, ATM, AXIN2, BARD1, BMPR1A, BRCA1, BRCA2, BRIP1, CDH1, CDKN2A (p14ARF), CDKN2A (p16INK4a), CHEK2, CTNNA1, DICER1, EPCAM (Deletion/duplication testing only), GREM1 (promoter region deletion/duplication  testing only), KIT, MEN1, MLH1, MSH2, MSH3, MSH6, MUTYH, NBN, NF1, NHTL1, PALB2, PDGFRA, PMS2, POLD1, POLE, PTEN, RAD50, RAD51C, RAD51D, SDHB, SDHC, SDHD, SMAD4, SMARCA4. STK11, TP53, TSC1, TSC2, and VHL.  The following genes were evaluated for sequence changes only: SDHA and HOXB13 c.251G>A variant only.    (2) anastrozole started 11/24/2016, changed to letrozole September 2018  (3) Status post right mastectomy with sentinel lymph node sampling 01/28/2017 for apT3 pN0, invasive lobular carcinoma, grade 1, with negative margins.   (a) status post right saline implant placement  and left mastopexy 11/14/2018   (4) Oncotype DX score of 21 predicts a 10 year risk of recurrence outside the breast of 14% if the patient's only systemic therapy is tamoxifen for 5 years. It also predicts no significant benefit from chemotherapy.  (5) adjuvant radiation completed 05/04/2017 Site/dose:   1. CW_Rt / 50.4 Gy in 28 fractions  2. Axilla Rt / 45 Gy in 25 fractions  (6) continue letrozole, 7 year treatment planned (to June 2025)  (a) bone density August 2016 was normal  (b) bone density at [facility] 11/22/2016 showed a T score of 0.0 (normal)  (c) bone density at [facility] July 2020 showed a T score of -0.5 (normal)  (d) bone density at [facility] 12/12/2020 shows a T score of -0.9 (normal)   PLAN: Jaiyanna is now a little over 4 years out from definitive surgery for her breast cancer with no evidence of disease recurrence.  This is very favorable.  She is tolerating letrozole well.  We have plan to continue that for a total of 7 years as she is interested in doing "everything possible" to keep breast cancer from recurring.  She does have vaginal dryness issues.  We discussed the pelvic rehab program.  Right now with her husband's problems she does not think she can participate but if she decides to do she will let us know  She will have mammography this October and again next October accordingly she will  return to see [place] November 2023.  Total encounter time 25 minutes.*  Nikaya Nasby, Virgie Dad, MD  03/03/21 2:29 PM Medical Oncology and Hematology [facility] [place], [place] [address] Tel. [phone]    Fax. [phone]   I, Wilburn Mylar, am acting as scribe for Dr. Virgie Dad. Kately Graffam.  I, Lurline Del MD, have reviewed the above documentation for accuracy and completeness, and I agree with the above.    *Total Encounter Time as defined by the Centers for Medicare and Medicaid Services includes, in addition to the face-to-face time of a patient visit (documented in the note above) non-face-to-face time: obtaining and reviewing outside history, ordering and reviewing medications, tests or procedures, care coordination (communications with other health care professionals or caregivers) and documentation in the medical record.

## 2021-03-04 ENCOUNTER — Other Ambulatory Visit: Payer: Self-pay

## 2021-03-04 ENCOUNTER — Ambulatory Visit: Payer: Medicare Other | Admitting: Physical Therapy

## 2021-03-04 ENCOUNTER — Encounter: Payer: Self-pay | Admitting: Physical Therapy

## 2021-03-04 DIAGNOSIS — R296 Repeated falls: Secondary | ICD-10-CM

## 2021-03-04 DIAGNOSIS — R2689 Other abnormalities of gait and mobility: Secondary | ICD-10-CM

## 2021-03-04 DIAGNOSIS — M6281 Muscle weakness (generalized): Secondary | ICD-10-CM

## 2021-03-04 NOTE — Therapy (Signed)
[place], [place], [address] Phone: [phone]   Fax:  [phone]  Physical Therapy Treatment  Patient Details  Name: [name] MRN: [id number] Date of Birth: 1940/09/17 Referring Provider (PT): Mariel Aloe, MD   Encounter Date: 03/04/2021   PT End of Session - 03/04/21 1406     Visit Number 7    Number of Visits 8    Date for PT Re-Evaluation 03/24/21    Authorization Type UHC MCR    Authorization Time Period FOTO by 10th    Progress Note Due on Visit 10    PT Start Time 1401    PT Stop Time 1443    PT Time Calculation (min) 42 min    Activity Tolerance Patient tolerated treatment well    Behavior During Therapy [facility] for tasks assessed/performed             Past Medical History:  Diagnosis Date   A-fib ([place])    Anemia    history of   Anxiety    Arthritis    Asthma    Asthma    Breast cancer ([place])    Cancer ([place])    skin cancers, one melanoma    Complication of anesthesia    patient woke up during colonoscopy   Concussion    8 yrs. 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    Keratosis, actinic    Melanoma ([place])    Persistent atrial fibrillation ([place]) 12/16/2016   CHA2DS2VASC score 3   Pneumonia    hx of     Past Surgical History:  Procedure Laterality Date   brachial skin removal due to deforimity Bilateral    BREAST ENHANCEMENT SURGERY Left 11/14/2018   Procedure: LEFT BREAST AUGMENTATION;  Surgeon: [name], MD;  Location: [place];  Service: Plastics;  Laterality: Left;    BREAST RECONSTRUCTION Right 06/26/2018   BREAST RECONSTRUCTION WITH PLACEMENT OF TISSUE EXPANDER AND ALLODERM Right 06/26/2018   Procedure: RIGHT BREAST RECONSTRUCTION WITH PLACEMENT OF TISSUE EXPANDER;  Surgeon: [name], MD;  Location: [place];  Service: Plastics;  Laterality: Right;   BREAST RECONSTRUCTION WITH PLACEMENT OF TISSUE EXPANDER AND FLEX HD (ACELLULAR HYDRATED DERMIS) Right 01/28/2017    RIGHT BREAST RECONSTRUCTION WITH PLACEMENT OF TISSUE EXPANDER AND ALLODERM;  Surgeon: [name], MD;  Location: [place];  Service: Plastics;  Laterality: Right;   BUNIONECTOMY     left    CATARACT EXTRACTION     bilaterla cataract surgery    COLONOSCOPY W/ POLYPECTOMY     JOINT REPLACEMENT     KNEE ARTHROSCOPY     left    LATISSIMUS FLAP TO BREAST Right 06/26/2018   Procedure: LATISSIMUS FLAP TO RIGHT CHEST;  Surgeon: [name], MD;  Location: [place];  Service: Plastics;  Laterality: Right;   MASTECTOMY Right    MASTECTOMY W/ SENTINEL NODE BIOPSY Right 01/28/2017   Procedure: RIGHT TOTAL MASTECTOMY WITH SENTINEL LYMPH NODE BIOPSY;  Surgeon: Excell Seltzer, MD;  Location: [place];  Service: General;  Laterality: Right;   MASTOPEXY Left 11/14/2018   Procedure: LEFT BREAST MASTOPEXY;  Surgeon: [name], MD;  Location: [place];  Service: Plastics;  Laterality: Left;   MELANOMA EXCISION Left    polyp removed from uterus      REMOVAL OF TISSUE EXPANDER AND PLACEMENT OF IMPLANT Right 11/14/2018   Procedure: REMOVAL OF RIGHT TISSUE EXPANDER AND PLACEMENT OF SALINE IMPLANT;  Surgeon: [name], MD;  Location: [place];  Service: Plastics;  Laterality: Right;   right wrist pinning      skin cancers removed     TISSUE EXPANDER PLACEMENT Right 02/27/2018   Procedure: REMOVAL OF TISSUE EXPANDER;  Surgeon: [name], MD;  Location: [place];  Service: Plastics;  Laterality: Right;   TOTAL KNEE ARTHROPLASTY Right 01/29/2013   Procedure:  RIGHT TOTAL KNEE ARTHROPLASTY;  Surgeon: Gearlean Alf, MD;  [facility];  Service: Orthopedics;  Laterality: Right;   TOTAL KNEE ARTHROPLASTY Left 08/29/2017   Procedure: LEFT TOTAL KNEE ARTHROPLASTY;  Surgeon: Gaynelle Arabian, MD;  [facility];  Service: Orthopedics;  Laterality: Left;    There were no vitals filed for this visit.   Subjective Assessment - 03/04/21 1402     Subjective Patient reports she is doing well, no new issues. She saw her cardiologist and had her medication switched and her BP is now consistently lower.    Patient Stated Goals Improve balance to reduce falls without having to use assistive device    Currently in Pain? No/denies                [facility] PT Assessment - 03/04/21 0001       Standardized Balance Assessment   Five times sit to stand comments  11      Timed Up and Go Test   Normal TUG (seconds) 9                     [facility] Adult PT Treatment/Exercise:   Therapeutic Exercise: - Nu-step L5 x 5 min while taking subjective - Sit<>stand 3 x 10 holding 5# weight at chest - Heel raises 2 x 20 - Side stepping at counter with yellow at ankles 3 x 15 - Forward 6" step-up 2 x 10 - Resisted cable backward walking with 13# holding handles 2 x 5    Neuromuscular re-ed: - Tandem stance 3 x 30 sec             PT Education - 03/04/21 1406     Education Details HEP update    Person(s) Educated Patient    Methods Explanation;Demonstration;Verbal cues;Handout    Comprehension Verbalized understanding;Returned demonstration;Verbal cues required;Need further instruction              PT Short Term Goals - 02/20/21 1101       PT SHORT TERM GOAL #1   Title Patient will be I with initial HEP to progress with PT    Time 4    Period Weeks    Status On-going    Target Date 02/24/21      PT SHORT TERM GOAL #2   Title PT will review FOTO with patient by 3rd visit to understand expected progress with therapy     Baseline reviewed on 4th visit    Time 3    Period Weeks    Status Achieved    Target Date 02/17/21  PT Long Term Goals - 02/24/21 1102       PT LONG TERM GOAL #1   Title Patient will be I with final HEP to maintain progress from PT    Time 8    Period Weeks    Status On-going    Target Date 03/24/21      PT LONG TERM GOAL #2   Title Patient will report >/= 54% functional status on FOTO to indicate improved confidence with balance and improve functional level    Baseline 57% functional status    Time 8    Period Weeks    Status Achieved      PT LONG TERM GOAL #3   Title Patient will improve 5xSTS to </= 12 seconds to indicate improved strength and reduced risk of falls    Baseline 14 seconds    Time 8    Period Weeks    Status On-going    Target Date 03/24/21      PT LONG TERM GOAL #4   Title Patient will demonstrate an improved BERG balance assessment >/= 50/56 in order to indicate improved balance and reduced risk of falls    Baseline 37/56    Time 8    Period Weeks    Status On-going    Target Date 03/24/21      PT LONG TERM GOAL #5   Title Patient will exhibit improved TUG  to </= 9 seconds to indicate improved gait and reduced risk of falls    Baseline 12 seconds    Time 8    Period Weeks    Status On-going    Target Date 03/24/21      PT LONG TERM GOAL #6   Title Patient will exhibit improved gross hip strength >/= 4/5 MMT and knee strength >/= 4+/5 MMT to improve curb negotation in community    Time 8    Period Weeks    Status On-going    Target Date 03/24/21                   Plan - 03/04/21 1407     Clinical Impression Statement Patient tolerated therapy well with no adverse effects. She demonstrates great improvement in her 5xSTS and TUG scores this visit indicating reduced fall risk since beginning therapy. She was able to progress back to performing strengthening and balance training in therapy without any report of  pain or shortness of breath. Patient would benefit from continued skilled PT to improve confidence with ambulation and reduce her risk of falls in order to return to prior activities such as yardwork    PT Treatment/Interventions ADLs/Self Care Home Management;Gait training;Stair training;Functional mobility training;Therapeutic activities;Therapeutic exercise;Balance training;Neuromuscular re-education;Manual techniques;Passive range of motion;Dry needling;Spinal Manipulations;Joint Manipulations;Patient/family education    PT Next Visit Plan Review HEP and progress PRN, balance training progressing to unstable surface or working outside of base of support, reactive step training, step-ups or curb/stair training, generalized LE strengthening    PT Home Exercise Plan [id number]    Consulted and Agree with Plan of Care Patient             Patient will benefit from skilled therapeutic intervention in order to improve the following deficits and impairments:  Abnormal gait, Difficulty walking, Decreased balance, Decreased strength, Decreased activity tolerance  Visit Diagnosis: Repeated falls  Muscle weakness (generalized)  Other abnormalities of gait and mobility     Problem List Patient Active Problem List   Diagnosis Date Noted  A-fib ([place]) 12/29/2020   Asthma, chronic, unspecified asthma severity, with acute exacerbation 12/29/2020   GAD (generalized anxiety disorder) 12/29/2020   History of breast cancer 12/29/2020   GERD (gastroesophageal reflux disease) 12/29/2020   Acute lower UTI 12/29/2020   Lactic acid increased 12/29/2020   Syncope 12/28/2020   Hypotension 12/28/2020   Hyponatremia 12/28/2020   History of breast cancer 06/26/2018   Genetic testing 12/24/2016   Persistent atrial fibrillation ([place]) 12/16/2016   Long term current use of anticoagulant therapy 12/16/2016   Hypertensive heart disease without CHF    Family history of breast cancer    Melanoma ([place])     Malignant neoplasm of upper-inner quadrant of right breast in female, estrogen receptor positive ([place]) 11/19/2016   OA (osteoarthritis) of knee 01/29/2013    Hilda Blades, PT, DPT, LAT, ATC 03/04/21  2:43 PM Phone: [phone] Fax: [facility] [address] [place], [place], [address] Phone: [phone]   Fax:  [phone]  Name: [name] MRN: [id number] Date of Birth: 07-Jul-1940

## 2021-03-04 NOTE — Patient Instructions (Signed)
Access Code: [email or web address] URL: [email or web address] Date: 03/04/2021 Prepared by: Hilda Blades  Exercises Romberg Stance with Head Nods - 2 x daily - 7 x weekly - 2 sets - 10 reps Romberg Stance with Head Rotation - 2 x daily - 7 x weekly - 2 sets - 10 reps Sit to Stand with Arms Crossed - 2 x daily - 7 x weekly - 2 sets - 10 reps Standing Marching - 2 x daily - 7 x weekly - 2 sets - 20 reps Side Stepping with Resistance at Ankles and Counter Support - 1 x daily - 7 x weekly - 3 sets - 15 reps Heel Raises with Counter Support - 2 x daily - 7 x weekly - 2 sets - 20 reps Standing Tandem Balance with Counter Support - 2 x daily - 7 x weekly - 2 sets - 30 hold

## 2021-03-05 ENCOUNTER — Encounter: Payer: Self-pay | Admitting: Physical Therapy

## 2021-03-10 ENCOUNTER — Other Ambulatory Visit: Payer: Self-pay

## 2021-03-10 ENCOUNTER — Encounter: Payer: Self-pay | Admitting: Physical Therapy

## 2021-03-10 ENCOUNTER — Ambulatory Visit: Payer: Medicare Other | Attending: Family Medicine | Admitting: Physical Therapy

## 2021-03-10 DIAGNOSIS — R2689 Other abnormalities of gait and mobility: Secondary | ICD-10-CM | POA: Diagnosis present

## 2021-03-10 DIAGNOSIS — R296 Repeated falls: Secondary | ICD-10-CM | POA: Insufficient documentation

## 2021-03-10 DIAGNOSIS — M6281 Muscle weakness (generalized): Secondary | ICD-10-CM | POA: Diagnosis present

## 2021-03-10 NOTE — Patient Instructions (Signed)
Access Code: [email or web address] URL: [email or web address] Date: 03/10/2021 Prepared by: Hilda Blades  Exercises Romberg Stance with Head Nods - 2 x daily - 7 x weekly - 2 sets - 10 reps Romberg Stance with Head Rotation - 2 x daily - 7 x weekly - 2 sets - 10 reps Sit to Stand with Arms Crossed - 2 x daily - 7 x weekly - 2 sets - 10 reps Standing Marching - 2 x daily - 7 x weekly - 2 sets - 20 reps Side Stepping with Resistance at Ankles and Counter Support - 2 x daily - 7 x weekly - 3 sets - 15 reps Heel Raises with Counter Support - 2 x daily - 7 x weekly - 2 sets - 20 reps Standing Tandem Balance with Counter Support - 2 x daily - 7 x weekly - 2 sets - 30 hold

## 2021-03-10 NOTE — Therapy (Signed)
[place], [place], [address] Phone: [phone]   Fax:  [phone]  Physical Therapy Treatment / Discharge  Patient Details  Name: [name] MRN: [id number] Date of Birth: 1940/07/09 Referring Provider (PT): Mariel Aloe, MD   Encounter Date: 03/10/2021   PT End of Session - 03/10/21 1140     Visit Number 8    Number of Visits 8    Date for PT Re-Evaluation 03/24/21    Authorization Type UHC MCR    Authorization Time Period FOTO by 10th    Progress Note Due on Visit 10    PT Start Time 1130    PT Stop Time 1215    PT Time Calculation (min) 45 min    Activity Tolerance Patient tolerated treatment well    Behavior During Therapy [facility] for tasks assessed/performed             Past Medical History:  Diagnosis Date   A-fib ([place])    Anemia    history of   Anxiety    Arthritis    Asthma    Asthma    Breast cancer ([place])    Cancer ([place])    skin cancers, one melanoma    Complication of anesthesia    patient woke up during colonoscopy   Concussion    8 yrs. 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    Keratosis, actinic    Melanoma ([place])    Persistent atrial fibrillation ([place]) 12/16/2016   CHA2DS2VASC score 3   Pneumonia    hx of     Past Surgical History:  Procedure Laterality Date   brachial skin removal due to deforimity Bilateral    BREAST ENHANCEMENT SURGERY Left 11/14/2018   Procedure: LEFT BREAST AUGMENTATION;  Surgeon: [name], MD;  Location: [place];  Service: Plastics;   Laterality: Left;   BREAST RECONSTRUCTION Right 06/26/2018   BREAST RECONSTRUCTION WITH PLACEMENT OF TISSUE EXPANDER AND ALLODERM Right 06/26/2018   Procedure: RIGHT BREAST RECONSTRUCTION WITH PLACEMENT OF TISSUE EXPANDER;  Surgeon: [name], MD;  Location: [place];  Service: Plastics;  Laterality: Right;   BREAST RECONSTRUCTION WITH PLACEMENT OF TISSUE EXPANDER AND FLEX HD (ACELLULAR HYDRATED DERMIS) Right 01/28/2017    RIGHT BREAST RECONSTRUCTION WITH PLACEMENT OF TISSUE EXPANDER AND ALLODERM;  Surgeon: [name], MD;  Location: [place];  Service: Plastics;  Laterality: Right;   BUNIONECTOMY     left    CATARACT EXTRACTION     bilaterla cataract surgery    COLONOSCOPY W/ POLYPECTOMY     JOINT REPLACEMENT     KNEE ARTHROSCOPY     left    LATISSIMUS FLAP TO BREAST Right 06/26/2018   Procedure: LATISSIMUS FLAP TO RIGHT CHEST;  Surgeon: [name], MD;  Location: [place];  Service: Plastics;  Laterality: Right;   MASTECTOMY Right    MASTECTOMY W/ SENTINEL NODE BIOPSY Right 01/28/2017   Procedure: RIGHT TOTAL MASTECTOMY WITH SENTINEL LYMPH NODE BIOPSY;  Surgeon: Excell Seltzer, MD;  [facility];  Service: General;  Laterality: Right;   MASTOPEXY Left 11/14/2018   Procedure: LEFT BREAST MASTOPEXY;  Surgeon: [name], MD;  Location: [place];  Service: Plastics;  Laterality: Left;   MELANOMA EXCISION Left    polyp removed from uterus      REMOVAL OF TISSUE EXPANDER AND PLACEMENT OF IMPLANT Right 11/14/2018   Procedure: REMOVAL OF RIGHT TISSUE EXPANDER AND PLACEMENT OF SALINE IMPLANT;  Surgeon: [name], MD;  Location: [place];  Service: Plastics;  Laterality: Right;   right wrist pinning      skin cancers removed     TISSUE EXPANDER PLACEMENT Right 02/27/2018   Procedure: REMOVAL OF TISSUE EXPANDER;  Surgeon: [name], MD;  Location: [place];  Service: Plastics;  Laterality: Right;   TOTAL KNEE ARTHROPLASTY Right  01/29/2013   Procedure: RIGHT TOTAL KNEE ARTHROPLASTY;  Surgeon: Gearlean Alf, MD;  [facility];  Service: Orthopedics;  Laterality: Right;   TOTAL KNEE ARTHROPLASTY Left 08/29/2017   Procedure: LEFT TOTAL KNEE ARTHROPLASTY;  Surgeon: Gaynelle Arabian, MD;  [facility];  Service: Orthopedics;  Laterality: Left;    There were no vitals filed for this visit.   Subjective Assessment - 03/10/21 1139     Subjective Patient reports she is doing well with no new issues. She has been consistent with her exercises ad they are going well.    Patient Stated Goals Improve balance to reduce falls without having to use assistive device    Currently in Pain? No/denies                [facility] PT Assessment - 03/10/21 0001       Assessment   Medical Diagnosis Balance    Referring Provider (PT) Mariel Aloe, MD      Precautions   Precautions None      Restrictions   Weight Bearing Restrictions No      Prior Function   Level of Independence Independent      Observation/Other Assessments   Focus on Therapeutic Outcomes (FOTO)  57% functional status   assessed on 02/24/2021     Strength   Right Hip ABduction 4/5    Left Hip ABduction 4/5    Right Knee Flexion 5/5    Right Knee Extension 5/5    Left Knee Flexion 5/5    Left Knee Extension 5/5      Standardized Balance Assessment   Five times sit to stand comments  11   assessed on 03/04/2021     Berg Balance Test   Sit to Stand Able to stand without using hands and stabilize independently    Standing Unsupported Able to stand safely 2 minutes    Sitting with Back Unsupported but Feet Supported on Floor or Stool Able to sit safely and securely 2 minutes    Stand to Sit Sits safely with minimal use of hands    Transfers Able to transfer safely, minor use of hands    Standing Unsupported with Eyes Closed Able to stand 10 seconds with supervision    Standing Unsupported with Feet Together Able to place feet together  independently and stand 1 minute safely    From Standing, Reach Forward with Outstretched Arm Can reach confidently >25 cm (10")    From Standing Position, Pick up Object from Floor Able to pick up shoe safely and easily    From Standing Position, Turn to Look Behind Over each Shoulder Looks behind  one side only/other side shows less weight shift    Turn 360 Degrees Able to turn 360 degrees safely in 4 seconds or less    Standing Unsupported, Alternately Place Feet on Step/Stool Able to stand independently and complete 8 steps >20 seconds    Standing Unsupported, One Foot in Front Able to plae foot ahead of the other independently and hold 30 seconds    Standing on One Leg Able to lift leg independently and hold equal to or more than 3 seconds    Total Score 50      Timed Up and Go Test   Normal TUG (seconds) 9   assessed on 03/04/2021                [facility] Adult PT Treatment/Exercise:   Therapeutic Exercise: - Nu-step L5 x 5 min while taking subjective - Sit<>stand 2 x 10 - Heel raises 2 x 20 - Side stepping at counter with red at ankles 2 x 15   Neuromuscular re-ed: - Romberg with head nods and turns x 10 each - Tandem stance 2 x 30 sec          [facility] Adult PT Treatment/Exercise - 03/10/21 0001       Self-Care   Self-Care Other Self-Care Comments    Other Self-Care Comments  Exam findings and progress toward goals, POC discharge                     PT Education - 03/10/21 1140     Education Details POC discharge, HEP    Person(s) Educated Patient    Methods Explanation    Comprehension Verbalized understanding              PT Short Term Goals - 03/10/21 1146       PT SHORT TERM GOAL #1   Title Patient will be I with initial HEP to progress with PT    Time 4    Period Weeks    Status Achieved    Target Date 02/24/21      PT SHORT TERM GOAL #2   Title PT will review FOTO with patient by 3rd visit to understand expected progress with  therapy    Baseline reviewed on 4th visit    Time 3    Period Weeks    Status Achieved    Target Date 02/17/21               PT Long Term Goals - 03/10/21 1146       PT LONG TERM GOAL #1   Title Patient will be I with final HEP to maintain progress from PT    Time 8    Period Weeks    Status Achieved      PT LONG TERM GOAL #2   Title Patient will report >/= 54% functional status on FOTO to indicate improved confidence with balance and improve functional level    Baseline 57% functional status    Time 8    Period Weeks    Status Achieved      PT LONG TERM GOAL #3   Title Patient will improve 5xSTS to </= 12 seconds to indicate improved strength and reduced risk of falls    Baseline 11 seconds    Time 8    Period Weeks    Status Achieved      PT LONG TERM GOAL #4   Title Patient will demonstrate an improved BERG balance assessment >/= 50/56  in order to indicate improved balance and reduced risk of falls    Baseline 50/56    Time 8    Period Weeks    Status Achieved      PT LONG TERM GOAL #5   Title Patient will exhibit improved TUG  to </= 9 seconds to indicate improved gait and reduced risk of falls    Baseline 9 seconds    Time 8    Period Weeks    Status Achieved      PT LONG TERM GOAL #6   Title Patient will exhibit improved gross hip strength >/= 4/5 MMT and knee strength >/= 4+/5 MMT to improve curb negotation in community    Baseline knee strength 5/5 MMT, hip strength grossly 4/5 MMT    Time 8    Period Weeks    Status Achieved                   Plan - 03/10/21 1142     Clinical Impression Statement Patient tolerated therapy well with no adverse effects. She has achived all established LTGs and demonstrates much improved strength, balance, and function. She is independent with her HEP and will be formally discharged from PT as it is no longer indictaed.    PT Treatment/Interventions ADLs/Self Care Home Management;Gait training;Stair  training;Functional mobility training;Therapeutic activities;Therapeutic exercise;Balance training;Neuromuscular re-education;Manual techniques;Passive range of motion;Dry needling;Spinal Manipulations;Joint Manipulations;Patient/family education    PT Next Visit Plan NA - discharge    PT Home Exercise Plan [email or web address]    Consulted and Agree with Plan of Care Patient             Patient will benefit from skilled therapeutic intervention in order to improve the following deficits and impairments:  Abnormal gait, Difficulty walking, Decreased balance, Decreased strength, Decreased activity tolerance  Visit Diagnosis: Repeated falls  Muscle weakness (generalized)  Other abnormalities of gait and mobility     Problem List Patient Active Problem List   Diagnosis Date Noted   A-fib ([place]) 12/29/2020   Asthma, chronic, unspecified asthma severity, with acute exacerbation 12/29/2020   GAD (generalized anxiety disorder) 12/29/2020   History of breast cancer 12/29/2020   GERD (gastroesophageal reflux disease) 12/29/2020   Acute lower UTI 12/29/2020   Lactic acid increased 12/29/2020   Syncope 12/28/2020   Hypotension 12/28/2020   Hyponatremia 12/28/2020   History of breast cancer 06/26/2018   Genetic testing 12/24/2016   Persistent atrial fibrillation ([place]) 12/16/2016   Long term current use of anticoagulant therapy 12/16/2016   Hypertensive heart disease without CHF    Family history of breast cancer    Melanoma ([place])    Malignant neoplasm of upper-inner quadrant of right breast in female, estrogen receptor positive ([place]) 11/19/2016   OA (osteoarthritis) of knee 01/29/2013    Hilda Blades, PT, DPT, LAT, ATC 03/10/21  12:40 PM Phone: [phone] Fax: [facility] [place], [place], [address] Phone: [phone]   Fax:  [phone]  Name: [name] MRN: [id number] Date of Birth:  1940/10/31   PHYSICAL THERAPY DISCHARGE SUMMARY  Visits from Start of Care: 8  Current functional level related to goals / functional outcomes: See above   Remaining deficits: See above   Education / Equipment: HEP   Patient agrees to discharge. Patient goals were met. Patient is being discharged due to meeting the stated rehab goals.

## 2021-03-12 ENCOUNTER — Encounter: Payer: Self-pay | Admitting: Physical Therapy

## 2021-03-18 ENCOUNTER — Ambulatory Visit
Admission: RE | Admit: 2021-03-18 | Discharge: 2021-03-18 | Disposition: A | Discharge status: Home / Self Care | Payer: Medicare Other | Source: Ambulatory Visit | Attending: Oncology | Admitting: Oncology

## 2021-03-18 ENCOUNTER — Other Ambulatory Visit: Payer: Self-pay

## 2021-03-18 DIAGNOSIS — Z1231 Encounter for screening mammogram for malignant neoplasm of breast: Secondary | ICD-10-CM

## 2021-03-21 ENCOUNTER — Other Ambulatory Visit: Payer: Self-pay | Admitting: Cardiology

## 2021-03-23 NOTE — Telephone Encounter (Signed)
Prescription refill request for Xarelto received.  Indication:afib Last office visit:[facility] 02/27/21 Weight:65kg Age:47f Scr:0.75 03/03/21 CrCl:63.4

## 2021-03-26 ENCOUNTER — Other Ambulatory Visit: Payer: Self-pay | Admitting: Oncology

## 2021-03-26 DIAGNOSIS — R928 Other abnormal and inconclusive findings on diagnostic imaging of breast: Secondary | ICD-10-CM

## 2021-03-31 ENCOUNTER — Telehealth: Payer: Self-pay

## 2021-03-31 NOTE — Telephone Encounter (Signed)
Pharm input noted. As below, this information will now be available for provider at follow-up visit scheduled 11/4 at which time clearance will be addressed. Preop FYI already added to appt notes for MD.

## 2021-03-31 NOTE — Telephone Encounter (Addendum)
Patient with diagnosis of afib on Xarelto for anticoagulation.    Procedure: colonoscopy Date of procedure: 07/15/21   CHA2DS2-VASc Score = 6  This indicates a 9.7% annual risk of stroke. The patient's score is based upon: CHF History: 1 HTN History: 1 Diabetes History: 0 Stroke History: 0 Vascular Disease History: 1 Age Score: 2 Gender Score: 1    CrCl 51.72 mL/min using adjusted body weight Platelet count 203K  Per office protocol, patient can hold Xarelto for 2 days prior to procedure assuming no significant clinical changes before procedure date (scheduled >3 months out).

## 2021-03-31 NOTE — Telephone Encounter (Signed)
   [facility] Pre-operative Risk Assessment    Patient Name: [name]  DOB: Dec 07, 1940 MRN: [id number]    Request for surgical clearance:  What type of surgery is being performed Colonoscopy  When is this surgery scheduled  07/15/21  What type of clearance is required  Pharmacy  Are there any medications that need to be held prior to surgery and how long   Xarelto hold 3 days prior  Practice name and name of physician performing surgery [facility]  Dr.Karki  What is the office phone number [phone]   7.   What is the office fax number [phone]  8.   Anesthesia type   Propofol    Thedore Mins Claudis Giovanelli 03/31/2021, 10:03 AM  _________________________________________________________________   (provider comments below)

## 2021-03-31 NOTE — Telephone Encounter (Addendum)
   Patient Name: [name]  DOB: [birth year] MRN: [id number]  Primary Cardiologist: Kirk Ruths, MD  Chart reviewed as part of pre-operative protocol coverage. The patient has an upcoming appointment scheduled 04/10/21 with Dr. [name] at which time this clearance can be addresssed. (Appointment date is well in advance of planned procedure/required holding time of blood thinners.) Will keep this appointment in case there are any new issues that would affect pre-operative clearance. I added preop FYI to appointment notes so that provider is aware to address at time of OV.   Tentatively I will also route to pharm team for input on holding Xarelto so that this information is aware for provider at time of OV to review.  Per office protocol, the cardiology provider should assess clearance at time of office visit and should forward their finalized clearance decision to requesting party below. I will route this message as FYI to requesting party and remove this message from the pre-op box as separate preop APP input not needed at this time.  Please call with questions.  Charlie Pitter, PA-C 03/31/2021, 12:11 PM

## 2021-04-02 NOTE — Progress Notes (Signed)
HPI: FU atrial fibrillation. She has been managed with rate control and anticoagulation.  At previous office visit patient had developed worsening lower extremity edema and dyspnea while on a trip to [place].  We discontinued Maxide and treated with Lasix and potassium. ProBNP 433.  Echocardiogram July 2022 showed ejection fraction 55%, moderate pulmonary hypertension, mild RV dysfunction, mild right ventricular enlargement, moderate left atrial enlargement, mild right atrial enlargement, mild aortic insufficiency.  Carotid Dopplers July 2022 showed 1 to 39% bilateral stenosis.  Patient had syncopal episode July 2022.  At the time patient's blood pressure was low; she was in her car and stood and walked to her porch and suddenly felt lightheaded with syncope.  Losartan was decreased.  She was also felt to be dehydrated.  Patient seen in follow-up September 2022 and reported spikes in blood pressure.  Losartan was discontinued and amlodipine initiated.  Since last seen, she has worsening pedal edema.  Occasional dyspnea on exertion.  No orthopnea, PND, exertional chest pain or syncope.  1 episode of chest tightness while lying down recently.  Current Outpatient Medications  Medication Sig Dispense Refill   albuterol (VENTOLIN HFA) 108 (90 Base) MCG/ACT inhaler Inhale 1 puff into the lungs every 6 (six) hours as needed for wheezing or shortness of breath.     ALPRAZolam (XANAX) 0.5 MG tablet Take 0.5 mg by mouth daily as needed for anxiety.      amLODipine (NORVASC) 5 MG tablet Take 1 tablet (5 mg total) by mouth daily. 180 tablet 3   B Complex Vitamins (VITAMIN B COMPLEX PO) Take 1 capsule by mouth daily.     BREO ELLIPTA 100-25 MCG/INH AEPB Inhale 1 puff into the lungs daily.     COLLAGEN PO Take 1 capsule by mouth daily.     desonide (DESOWEN) 0.05 % cream Apply 1 application topically 2 (two) times daily as needed (for skin irritation.).     diclofenac sodium (VOLTAREN) 1 % GEL Apply 2 g  topically daily as needed (pain).      EPINEPHrine 0.3 mg/0.3 mL IJ SOAJ injection Inject 0.3 mg into the muscle as needed for anaphylaxis.     famotidine (PEPCID) 20 MG tablet Take 1 tablet by mouth as directed.     fluorouracil (EFUDEX) 5 % cream Apply 1 application topically as directed. Per MD instructions, for skin cancer     fluticasone (FLONASE) 50 MCG/ACT nasal spray Place 2 sprays into both nostrils daily.     furosemide (LASIX) 20 MG tablet Take 20 mg by mouth daily as needed.     letrozole (FEMARA) 2.5 MG tablet Take 2.5 mg by mouth at bedtime.     levocetirizine (XYZAL) 5 MG tablet Take 5 mg by mouth at bedtime.     losartan (COZAAR) 50 MG tablet Take 50 mg by mouth daily.     Methylsulfonylmethane (MSM PO) Take 1 capsule by mouth daily.     MILK THISTLE PO Take 1 capsule by mouth daily.     Misc Natural Products (PYCNOGENOL COMPLEX PO) Take 1 capsule by mouth daily.     Omega-3 Fatty Acids (OMEGA 3 PO) Take 1 capsule by mouth daily.     OVER THE COUNTER MEDICATION Take 1 capsule by mouth daily. Immunity supplement     Polyethyl Glycol-Propyl Glycol (SYSTANE OP) Place 1 drop into both eyes daily.     potassium chloride (KLOR-CON) 20 MEQ packet Take 20 mEq by mouth daily as needed.  propranolol ER (INDERAL LA) 80 MG 24 hr capsule Take 80 mg by mouth daily.     TURMERIC PO Take 1 capsule by mouth daily.     venlafaxine XR (EFFEXOR-XR) 75 MG 24 hr capsule Take 75 mg by mouth daily with breakfast.     VITAMIN D PO Take 1 capsule by mouth daily.     XARELTO 20 MG TABS tablet TAKE 1 TABLET BY MOUTH DAILY WITH SUPPER 90 tablet 1   No current facility-administered medications for this visit.     Past Medical History:  Diagnosis Date   A-fib ([place])    Anemia    history of   Anxiety    Arthritis    Asthma    Asthma    Breast cancer ([place])    Cancer ([place])    skin cancers, one melanoma    Complication of anesthesia    patient woke up during colonoscopy   Concussion    8 yrs.  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    Keratosis, actinic    Melanoma ([place])    Persistent atrial fibrillation ([place]) 12/16/2016   CHA2DS2VASC score 3   Pneumonia    hx of     Past Surgical History:  Procedure Laterality Date   brachial skin removal due to deforimity Bilateral    BREAST ENHANCEMENT SURGERY Left 11/14/2018   Procedure: LEFT BREAST AUGMENTATION;  Surgeon: [name], MD;  Location: [place];  Service: Plastics;  Laterality: Left;   BREAST RECONSTRUCTION Right 06/26/2018   BREAST RECONSTRUCTION WITH PLACEMENT OF TISSUE EXPANDER AND ALLODERM Right 06/26/2018   Procedure: RIGHT BREAST RECONSTRUCTION WITH PLACEMENT OF TISSUE EXPANDER;  Surgeon: [name], MD;  Location: [place];  Service: Plastics;  Laterality: Right;   BREAST RECONSTRUCTION WITH PLACEMENT OF TISSUE EXPANDER AND FLEX HD (ACELLULAR HYDRATED DERMIS) Right 01/28/2017    RIGHT BREAST RECONSTRUCTION WITH PLACEMENT OF TISSUE EXPANDER AND ALLODERM;  Surgeon: [name], MD;  Location: [place];  Service: Plastics;  Laterality: Right;   BUNIONECTOMY     left    CATARACT EXTRACTION     bil[place]la cataract surgery    COLONOSCOPY W/ POLYPECTOMY     JOINT REPLACEMENT     KNEE ARTHROSCOPY     left    LATISSIMUS FLAP TO BREAST Right 06/26/2018   Procedure: LATISSIMUS FLAP TO RIGHT CHEST;  Surgeon: [name], MD;  Location: [place];  Service: Plastics;  Laterality: Right;   MASTECTOMY Right    MASTECTOMY W/ SENTINEL NODE BIOPSY Right 01/28/2017   Procedure: RIGHT TOTAL MASTECTOMY WITH SENTINEL LYMPH NODE BIOPSY;   Surgeon: Excell Seltzer, MD;  Location: [place];  Service: General;  Laterality: Right;   MASTOPEXY Left 11/14/2018   Procedure: LEFT BREAST MASTOPEXY;  Surgeon: [name], MD;  Location: [place];  Service: Plastics;  Laterality: Left;   MELANOMA EXCISION Left    polyp removed from uterus      REMOVAL OF TISSUE EXPANDER AND PLACEMENT OF IMPLANT Right 11/14/2018   Procedure: REMOVAL OF  RIGHT TISSUE EXPANDER AND PLACEMENT OF SALINE IMPLANT;  Surgeon: [name], MD;  Location: [place];  Service: Plastics;  Laterality: Right;   right wrist pinning      skin cancers removed     TISSUE EXPANDER PLACEMENT Right 02/27/2018   Procedure: REMOVAL OF TISSUE EXPANDER;  Surgeon: [name], MD;  Location: [facility];  Service: Plastics;  Laterality: Right;   TOTAL KNEE ARTHROPLASTY Right 01/29/2013   Procedure: RIGHT TOTAL KNEE ARTHROPLASTY;  Surgeon: Gearlean Alf, MD;  Location: [facility];  Service: Orthopedics;  Laterality: Right;   TOTAL KNEE ARTHROPLASTY Left 08/29/2017   Procedure: LEFT TOTAL KNEE ARTHROPLASTY;  Surgeon: Gaynelle Arabian, MD;  Location: [facility];  Service: Orthopedics;  Laterality: Left;    Social History   Socioeconomic History   Marital status: Married    Spouse name: Not on file   Number of children: Not on file   Years of education: Not on file   Highest education level: Not on file  Occupational History   Not on file  Tobacco Use   Smoking status: Never   Smokeless tobacco: Never  Vaping Use   Vaping Use: Never used  Substance and Sexual Activity   Alcohol use: Yes    Alcohol/week: 1.0 standard drink    Types: 1 Glasses of wine per week    Comment: occ   Drug use: No   Sexual activity: Not on file  Other Topics Concern   Not on file  Social History Narrative   ** Merged History Encounter **       Social Determinants of Health   Financial Resource Strain: Not on file  Food Insecurity: Not on file  Transportation  Needs: Not on file  Physical Activity: Not on file  Stress: Not on file  Social Connections: Not on file  Intimate Partner Violence: Not on file    Family History  Problem Relation Age of Onset   Hypertension Other    COPD Father    Breast cancer Other        dx in her 20s-30s    ROS: no fevers or chills, productive cough, hemoptysis, dysphasia, odynophagia, melena, hematochezia, dysuria, hematuria, rash, seizure activity, orthopnea, PND, pedal edema, claudication. Remaining systems are negative.  Physical Exam: Well-developed well-nourished in no acute distress.  Skin is warm and dry.  HEENT is normal.  Neck is supple.  Chest is clear to auscultation with normal expansion.  Cardiovascular exam is irregular Abdominal exam nontender or distended. No masses palpated. Extremities show no edema. neuro grossly intact  ECG-atrial fibrillation at a rate of 69, normal axis, nonspecific ST changes.  Personally reviewed  A/P  1 previous syncopal episode-felt likely secondary to orthostasis/hypotension.  No recurrences.  2 permanent atrial fibrillation-we will continue beta-blocker at present dose.  Continue Xarelto.  3 hypertension-blood pressure is controlled.  However she has developed pedal edema on amlodipine.  We will discontinue.  I will add Avapro 150 mg daily.  Check potassium and renal function in 1 week.  Follow blood pressure and advance medications as needed.  4 history of edema-continue diuretic as needed.  Kirk Ruths, MD

## 2021-04-10 ENCOUNTER — Ambulatory Visit (INDEPENDENT_AMBULATORY_CARE_PROVIDER_SITE_OTHER): Payer: Medicare Other | Admitting: Cardiology

## 2021-04-10 ENCOUNTER — Other Ambulatory Visit: Payer: Self-pay

## 2021-04-10 ENCOUNTER — Encounter: Payer: Self-pay | Admitting: Cardiology

## 2021-04-10 VITALS — BP 120/65 | HR 69 | Ht 60.0 in | Wt 146.2 lb

## 2021-04-10 DIAGNOSIS — I10 Essential (primary) hypertension: Secondary | ICD-10-CM | POA: Diagnosis not present

## 2021-04-10 DIAGNOSIS — I5032 Chronic diastolic (congestive) heart failure: Secondary | ICD-10-CM

## 2021-04-10 DIAGNOSIS — I4819 Other persistent atrial fibrillation: Secondary | ICD-10-CM | POA: Diagnosis not present

## 2021-04-10 MED ORDER — IRBESARTAN 150 MG PO TABS: 150.0000 mg | ORAL_TABLET | Freq: Every day | ORAL | 3 refills | Status: AC

## 2021-04-10 NOTE — Patient Instructions (Signed)
Medication Instructions:   STOP AMLODIPINE  START IRBESARTAN 150 MG ONCE DAILY  *If you need a refill on your cardiac medications before your next appointment, please call your pharmacy*   Lab Work:  Your physician recommends that you return for lab work in: [place]  If you have labs (blood work) drawn today and your tests are completely normal, you will receive your results only by: [employer] (if you have MyChart) OR A paper copy in the mail If you have any lab test that is abnormal or we need to change your treatment, we will call you to review the results.   Follow-Up: At [facility], you and your health needs are our priority.  As part of our continuing mission to provide you with exceptional heart care, we have created designated Provider Care Teams.  These Care Teams include your primary Cardiologist (physician) and Advanced Practice Providers (APPs -  Physician Assistants and Nurse Practitioners) who all work together to provide you with the care you need, when you need it.  We recommend signing up for the patient portal called "MyChart".  Sign up information is provided on this After Visit Summary.  MyChart is used to connect with patients for Virtual Visits (Telemedicine).  Patients are able to view lab/test results, encounter notes, upcoming appointments, etc.  Non-urgent messages can be sent to your provider as well.   To learn more about what you can do with MyChart, go to [email or web address].    Your next appointment:   6 month(s)  The format for your next appointment:   In Person  Provider:   Kirk Ruths, MD {

## 2021-04-21 LAB — BASIC METABOLIC PANEL
BUN/Creatinine Ratio: 14 (ref 12–28)
BUN: 10 mg/dL (ref 8–27)
CO2: 24 mmol/L (ref 20–29)
Calcium: 9.8 mg/dL (ref 8.7–10.3)
Chloride: 96 mmol/L (ref 96–106)
Creatinine, Ser: 0.69 mg/dL (ref 0.57–1.00)
Glucose: 94 mg/dL (ref 70–99)
Potassium: 4.6 mmol/L (ref 3.5–5.2)
Sodium: 136 mmol/L (ref 134–144)
eGFR: 89 mL/min/{1.73_m2} (ref >59)

## 2021-04-22 ENCOUNTER — Encounter: Payer: Self-pay | Admitting: *Deleted

## 2021-04-25 ENCOUNTER — Ambulatory Visit
Admission: RE | Admit: 2021-04-25 | Discharge: 2021-04-25 | Disposition: A | Discharge status: Home / Self Care | Payer: Medicare Other | Source: Ambulatory Visit | Attending: Oncology | Admitting: Oncology

## 2021-04-25 ENCOUNTER — Other Ambulatory Visit: Payer: Self-pay

## 2021-04-25 DIAGNOSIS — R928 Other abnormal and inconclusive findings on diagnostic imaging of breast: Secondary | ICD-10-CM

## 2021-04-27 NOTE — Progress Notes (Signed)
Cardiology Office Note:    Date:  04/28/2021   ID:  [name], DOB 1940-08-17, MRN [id number]  PCP:  [facility] [place] Cardiologist: Kirk Ruths, MD   Reason for visit: Follow-up  History of Present Illness:    [name] is a 80 y.o. female with a hx of atrial fibrillation, diastolic heart failure.  She has history of syncope in July 2022 with associated low blood pressure, likely exacerbated by dehydration.  Her losartan was decreased.    She saw me in September concerned about blood pressure spikes to 180s over 100s.  I started amlodipine.  She then had pedal edema and amlodipine was discontinued.  Dr. [name] saw her on April 10, 2021 and added Avapro 150 mg daily.  Today, the patient stated her blood pressure is finally stable after the addition of the Avapro.  Her blood pressure at home was 111/70.  She denies lightheadedness and presyncope.  Otherwise, she denies palpitations, chest pain, PND and orthopnea.  After stopping amlodipine her leg swelling is better.  Before she had to take Lasix 3-4 times per week.  She has taken no Lasix in this past week.  She has some chronic shortness of breath on exertion with going up hills.  She has no shortness of breath walking around the house.  She states before COVID, she was very active at the [facility] doing 3-5 classes per week (Silver sneakers, yoga) and personal training sessions.  She jokes that the only exercise she is doing now is going to doctors appointments.  She knows she could be ready to go back to the [facility].  We discussed our PREP exercise program.   Past Medical History:  Diagnosis Date   A-fib ([place])    Anemia    history of   Anxiety    Arthritis    Asthma    Asthma    Breast cancer ([place])    Cancer ([place])    skin cancers, one melanoma    Complication of anesthesia    patient woke up during colonoscopy   Concussion    8 yrs. ago due to being thrown from a horse    Dislocated elbow 10/12/2016   left   Dysrhythmia 12/2016   A-fib   Dysrhythmia    Family history of breast cancer    GERD (gastroesophageal reflux disease)    History of kidney stones    History of radiation therapy 03/23/17-05/04/17   right chest wall 50.4 Gy in 28 fractions, right axilla 45 Gy in 25 fractions   Hypertension    Hypertensive heart disease without CHF    Keratosis, actinic    Melanoma ([place])    Persistent atrial fibrillation ([place]) 12/16/2016   CHA2DS2VASC score 3   Pneumonia    hx of     Past Surgical History:  Procedure Laterality Date   brachial skin removal due to deforimity Bilateral    BREAST ENHANCEMENT SURGERY Left 11/14/2018   Procedure: LEFT BREAST AUGMENTATION;  Surgeon: [name], MD;  Location: [place];  Service: Plastics;  Laterality: Left;   BREAST RECONSTRUCTION Right 06/26/2018   BREAST RECONSTRUCTION WITH PLACEMENT OF TISSUE EXPANDER AND ALLODERM Right 06/26/2018   Procedure: RIGHT BREAST RECONSTRUCTION WITH PLACEMENT OF TISSUE EXPANDER;  Surgeon: [name], MD;  Location: [place];  Service: Plastics;  Laterality: Right;   BREAST RECONSTRUCTION WITH PLACEMENT OF TISSUE EXPANDER AND FLEX HD (ACELLULAR HYDRATED DERMIS) Right 01/28/2017  RIGHT BREAST RECONSTRUCTION WITH PLACEMENT OF TISSUE EXPANDER AND ALLODERM;  Surgeon: [name], MD;  Location: [place];  Service: Plastics;  Laterality: Right;   BUNIONECTOMY     left    CATARACT EXTRACTION     bilaterla cataract surgery    COLONOSCOPY W/ POLYPECTOMY     JOINT REPLACEMENT     KNEE ARTHROSCOPY     left    LATISSIMUS FLAP TO BREAST Right 06/26/2018   Procedure: LATISSIMUS FLAP TO RIGHT CHEST;  Surgeon: [name], MD;  Location: [place];  Service: Plastics;  Laterality: Right;   MASTECTOMY Right    MASTECTOMY W/ SENTINEL NODE BIOPSY Right 01/28/2017   Procedure: RIGHT TOTAL MASTECTOMY WITH SENTINEL LYMPH NODE BIOPSY;  Surgeon: Excell Seltzer, MD;   Location: [place];  Service: General;  Laterality: Right;   MASTOPEXY Left 11/14/2018   Procedure: LEFT BREAST MASTOPEXY;  Surgeon: [name], MD;  Location: [place];  Service: Plastics;  Laterality: Left;   MELANOMA EXCISION Left    polyp removed from uterus      REMOVAL OF TISSUE EXPANDER AND PLACEMENT OF IMPLANT Right 11/14/2018   Procedure: REMOVAL OF RIGHT TISSUE EXPANDER AND PLACEMENT OF SALINE IMPLANT;  Surgeon: [name], MD;  Location: [place];  Service: Plastics;  Laterality: Right;   right wrist pinning      skin cancers removed     TISSUE EXPANDER PLACEMENT Right 02/27/2018   Procedure: REMOVAL OF TISSUE EXPANDER;  Surgeon: [name], MD;  Location: [place];  Service: Plastics;  Laterality: Right;   TOTAL KNEE ARTHROPLASTY Right 01/29/2013   Procedure: RIGHT TOTAL KNEE ARTHROPLASTY;  Surgeon: Gearlean Alf, MD;  [facility];  Service: Orthopedics;  Laterality: Right;   TOTAL KNEE ARTHROPLASTY Left 08/29/2017   Procedure: LEFT TOTAL KNEE ARTHROPLASTY;  Surgeon: Gaynelle Arabian, MD;  [facility];  Service: Orthopedics;  Laterality: Left;    Current Medications: Current Meds  Medication Sig   albuterol (VENTOLIN HFA) 108 (90 Base) MCG/ACT inhaler Inhale 1 puff into the lungs every 6 (six) hours as needed for wheezing or shortness of breath.   ALPRAZolam (XANAX) 0.5 MG tablet Take 0.5 mg by mouth daily as needed for anxiety.    B Complex Vitamins (VITAMIN B COMPLEX PO) Take 1 capsule by mouth daily.   BREO ELLIPTA 100-25 MCG/INH AEPB Inhale 1 puff into the lungs daily.   COLLAGEN PO Take 1 capsule by mouth daily.   desonide (DESOWEN) 0.05 % cream Apply 1 application topically 2 (two) times daily as needed (for skin irritation.).   diclofenac sodium (VOLTAREN) 1 % GEL Apply 2 g topically daily as needed (pain).    EPINEPHrine 0.3 mg/0.3 mL IJ SOAJ injection Inject 0.3 mg into the muscle as needed for anaphylaxis.    famotidine (PEPCID) 20 MG tablet Take 1 tablet by mouth as directed.   fluorouracil (EFUDEX) 5 % cream Apply 1 application topically as directed. Per MD instructions, for skin cancer   fluticasone (FLONASE) 50 MCG/ACT nasal spray Place 2 sprays into both nostrils daily.   furosemide (LASIX) 20 MG tablet Take 20 mg by mouth daily as needed.   irbesartan (AVAPRO) 150 MG tablet Take 1 tablet (150 mg total) by mouth daily.   letrozole (FEMARA) 2.5 MG tablet Take 2.5 mg by mouth at bedtime.   levocetirizine (XYZAL) 5 MG tablet Take 5 mg by mouth at bedtime.   Methylsulfonylmethane (MSM PO) Take 1 capsule by mouth daily.  MILK THISTLE PO Take 1 capsule by mouth daily.   Misc Natural Products (PYCNOGENOL COMPLEX PO) Take 1 capsule by mouth daily.   Omega-3 Fatty Acids (OMEGA 3 PO) Take 1 capsule by mouth daily.   OVER THE COUNTER MEDICATION Take 1 capsule by mouth daily. Immunity supplement   Polyethyl Glycol-Propyl Glycol (SYSTANE OP) Place 1 drop into both eyes daily.   potassium chloride (KLOR-CON) 20 MEQ packet Take 20 mEq by mouth daily as needed.   propranolol ER (INDERAL LA) 80 MG 24 hr capsule Take 80 mg by mouth daily.   TURMERIC PO Take 1 capsule by mouth daily.   venlafaxine XR (EFFEXOR-XR) 75 MG 24 hr capsule Take 75 mg by mouth daily with breakfast.   VITAMIN D PO Take 1 capsule by mouth daily.   XARELTO 20 MG TABS tablet TAKE 1 TABLET BY MOUTH DAILY WITH SUPPER     Allergies:   Chlorhexidine gluconate, Doxycycline, Codeine, Doxycycline, Nsaids, Nsaids, Tylenol [acetaminophen], Adhesive [tape], Chlorhexidine, Codeine, and Tylenol [acetaminophen]   Social History   Socioeconomic History   Marital status: Married    Spouse name: Not on file   Number of children: Not on file   Years of education: Not on file   Highest education level: Not on file  Occupational History   Not on file  Tobacco Use   Smoking status: Never   Smokeless tobacco: Never  Vaping Use   Vaping Use: Never  used  Substance and Sexual Activity   Alcohol use: Yes    Alcohol/week: 1.0 standard drink    Types: 1 Glasses of wine per week    Comment: occ   Drug use: No   Sexual activity: Not on file  Other Topics Concern   Not on file  Social History Narrative   ** Merged History Encounter **       Social Determinants of Health   Financial Resource Strain: Not on file  Food Insecurity: Not on file  Transportation Needs: Not on file  Physical Activity: Not on file  Stress: Not on file  Social Connections: Not on file     Family History: The patient's family history includes Breast cancer in an other family member; COPD in her father; Hypertension in an other family member.  ROS:   Please see the history of present illness.     EKGs/Labs/Other Studies Reviewed:    EKG:  The ekg ordered today demonstrates atrial fibrillation, nonspecific T wave abnormality, heart rate 69, QRS duration 78 ms.  Recent Labs: 12/29/2020: Magnesium 2.3; TSH 2.364 03/03/2021: ALT 33; Hemoglobin 14.1; Platelets 203 04/20/2021: BUN 10; Creatinine, Ser 0.69; Potassium 4.6; Sodium 136   Recent Lipid Panel No results found for: CHOL, TRIG, HDL, LDLCALC, LDLDIRECT  Physical Exam:    VS:  BP 116/88 (BP Location: Left Arm, Patient Position: Sitting, Cuff Size: Normal)   Pulse 69   Ht 5' (1.524 m)   Wt 140 lb (63.5 kg)   BMI 27.34 kg/m    No data found.  Wt Readings from Last 3 Encounters:  04/28/21 140 lb (63.5 kg)  04/10/21 146 lb 3.2 oz (66.3 kg)  03/03/21 143 lb 4.8 oz (65 kg)     GEN:  Well nourished, well developed in no acute distress HEENT: Normal NECK: No JVD; No carotid bruits CARDIAC: irreg irreg, no murmurs, rubs, gallops RESPIRATORY:  Clear to auscultation without rales, wheezing or rhonchi  ABDOMEN: Soft, non-tender, non-distended MUSCULOSKELETAL: trace edema; No deformity  SKIN: Warm and  dry NEUROLOGIC:  Alert and oriented PSYCHIATRIC:  Normal affect     ASSESSMENT AND PLAN    Hypertension, well controlled. -Had worsening pedal edema with amlodipine. -Continue Avapro and propanolol.  Checking BMET today. -Referred to our PREP program/encouraged her to restart Silver sneakers. -Goal BP is <701/77 but systolic >939 with hx of syncope.   Permanent atrial fibrillation -Asymptomatic. -Continue beta-blocker for rate control and Xarelto for stroke prevention.   Chronic diastolic CHF/LE edema, well controlled -Echocardiogram July 2022 showed ejection fraction 55%, moderate pulmonary hypertension, mild RV dysfunction, mild right ventricular enlargement, moderate left atrial enlargement, mild right atrial enlargement, mild aortic insufficiency. -Continue Lasix as needed.   Fam hx of CVA -Check fasting lipids. -Blood pressure control. -Carotid duplex in July 2022 showed mild disease.   Disposition - Follow-up in 6 months.        Medication Adjustments/Labs and Tests Ordered: Current medicines are reviewed at length with the patient today.  Concerns regarding medicines are outlined above.  Orders Placed This Encounter  Procedures   Amb Referral To Provider Referral Exercise Program (P.R.E.P)   EKG 12-Lead   No orders of the defined types were placed in this encounter.   Patient Instructions  Medication Instructions:  No Changes *If you need a refill on your cardiac medications before your next appointment, please call your pharmacy*   Lab Work: No Labs If you have labs (blood work) drawn today and your tests are completely normal, you will receive your results only by: [place] (if you have MyChart) OR A paper copy in the mail If you have any lab test that is abnormal or we need to change your treatment, we will call you to review the results.   Testing/Procedures: No Testing   Follow-Up: At [facility], you and your health needs are our priority.  As part of our continuing mission to provide you with exceptional heart care, we have  created designated Provider Care Teams.  These Care Teams include your primary Cardiologist (physician) and Advanced Practice Providers (APPs -  Physician Assistants and Nurse Practitioners) who all work together to provide you with the care you need, when you need it.  We recommend signing up for the patient portal called "MyChart".  Sign up information is provided on this After Visit Summary.  MyChart is used to connect with patients for Virtual Visits (Telemedicine).  Patients are able to view lab/test results, encounter notes, upcoming appointments, etc.  Non-urgent messages can be sent to your provider as well.   To learn more about what you can do with MyChart, go to [email or web address].    Your next appointment:   6 month(s)  The format for your next appointment:   In Person  Provider:   Caron Presume, PA-C Then, Kirk Ruths, MD will plan to see you again in 1 year(s).      Signed, Warren Lacy, PA-C  04/28/2021 11:00 AM    [place]

## 2021-04-28 ENCOUNTER — Ambulatory Visit (INDEPENDENT_AMBULATORY_CARE_PROVIDER_SITE_OTHER): Payer: Medicare Other | Admitting: Physician Assistant

## 2021-04-28 ENCOUNTER — Other Ambulatory Visit: Payer: Self-pay

## 2021-04-28 ENCOUNTER — Encounter: Payer: Self-pay | Admitting: Physician Assistant

## 2021-04-28 ENCOUNTER — Other Ambulatory Visit: Payer: Self-pay | Admitting: Physician Assistant

## 2021-04-28 VITALS — BP 116/88 | HR 69 | Ht 60.0 in | Wt 140.0 lb

## 2021-04-28 DIAGNOSIS — I10 Essential (primary) hypertension: Secondary | ICD-10-CM

## 2021-04-28 DIAGNOSIS — I4819 Other persistent atrial fibrillation: Secondary | ICD-10-CM | POA: Diagnosis not present

## 2021-04-28 DIAGNOSIS — Z79899 Other long term (current) drug therapy: Secondary | ICD-10-CM

## 2021-04-28 DIAGNOSIS — I5032 Chronic diastolic (congestive) heart failure: Secondary | ICD-10-CM

## 2021-04-28 LAB — LIPID PANEL
Chol/HDL Ratio: 1.8 ratio (ref 0.0–4.4)
Cholesterol, Total: 167 mg/dL (ref 100–199)
HDL: 95 mg/dL (ref >39)
LDL Chol Calc (NIH): 56 mg/dL (ref 0–99)
Triglycerides: 92 mg/dL (ref 0–149)
VLDL Cholesterol Cal: 16 mg/dL (ref 5–40)

## 2021-04-28 LAB — BASIC METABOLIC PANEL
BUN/Creatinine Ratio: 12 (ref 12–28)
BUN: 9 mg/dL (ref 8–27)
CO2: 24 mmol/L (ref 20–29)
Calcium: 9.6 mg/dL (ref 8.7–10.3)
Chloride: 93 mmol/L — ABNORMAL LOW (ref 96–106)
Creatinine, Ser: 0.78 mg/dL (ref 0.57–1.00)
Glucose: 90 mg/dL (ref 70–99)
Potassium: 5.5 mmol/L — ABNORMAL HIGH (ref 3.5–5.2)
Sodium: 133 mmol/L — ABNORMAL LOW (ref 134–144)
eGFR: 78 mL/min/{1.73_m2} (ref >59)

## 2021-04-28 NOTE — Patient Instructions (Signed)
Medication Instructions:  No Changes *If you need a refill on your cardiac medications before your next appointment, please call your pharmacy*   Lab Work: No Labs If you have labs (blood work) drawn today and your tests are completely normal, you will receive your results only by: [place] (if you have MyChart) OR A paper copy in the mail If you have any lab test that is abnormal or we need to change your treatment, we will call you to review the results.   Testing/Procedures: No Testing   Follow-Up: At [facility], you and your health needs are our priority.  As part of our continuing mission to provide you with exceptional heart care, we have created designated Provider Care Teams.  These Care Teams include your primary Cardiologist (physician) and Advanced Practice Providers (APPs -  Physician Assistants and Nurse Practitioners) who all work together to provide you with the care you need, when you need it.  We recommend signing up for the patient portal called "MyChart".  Sign up information is provided on this After Visit Summary.  MyChart is used to connect with patients for Virtual Visits (Telemedicine).  Patients are able to view lab/test results, encounter notes, upcoming appointments, etc.  Non-urgent messages can be sent to your provider as well.   To learn more about what you can do with MyChart, go to [email or web address].    Your next appointment:   6 month(s)  The format for your next appointment:   In Person  Provider:   Caron Presume, PA-C Then, Kirk Ruths, MD will plan to see you again in 1 year(s).

## 2021-04-29 ENCOUNTER — Other Ambulatory Visit: Payer: Medicare Other

## 2021-04-29 ENCOUNTER — Telehealth: Payer: Self-pay | Admitting: *Deleted

## 2021-04-29 DIAGNOSIS — E875 Hyperkalemia: Secondary | ICD-10-CM

## 2021-04-29 NOTE — Telephone Encounter (Signed)
Spoke with pt, she is not taking potassium and her blood pressure medicine that had potassium in it was recently changed. She is not able to get to the office today but she will watch her potassium rich foods and will come to the office Monday for repeat. ER precautions discussed with the patient.

## 2021-04-29 NOTE — Telephone Encounter (Signed)
-----   Message from Lelon Perla, MD sent at 04/28/2021  5:06 PM EST ----- Make sure she is not taking k supplement; repeat bmet tomorrow Kirk Ruths

## 2021-05-04 ENCOUNTER — Telehealth: Payer: Self-pay

## 2021-05-04 NOTE — Telephone Encounter (Addendum)
Called patient regarding results. Left message----- Message from Warren Lacy, PA-C sent at 05/04/2021  1:43 PM EST ----- Cholesterol beautiful.  HDL (good cholesterol) is high and LDL (bad cholesterol) is low.  Continue with healthy eating and exercise.

## 2021-05-05 LAB — BASIC METABOLIC PANEL
BUN/Creatinine Ratio: 14 (ref 12–28)
BUN: 10 mg/dL (ref 8–27)
CO2: 20 mmol/L (ref 20–29)
Calcium: 9.7 mg/dL (ref 8.7–10.3)
Chloride: 94 mmol/L — ABNORMAL LOW (ref 96–106)
Creatinine, Ser: 0.7 mg/dL (ref 0.57–1.00)
Glucose: 77 mg/dL (ref 70–99)
Potassium: 4.6 mmol/L (ref 3.5–5.2)
Sodium: 136 mmol/L (ref 134–144)
eGFR: 88 mL/min/{1.73_m2} (ref >59)

## 2021-05-06 ENCOUNTER — Telehealth: Payer: Self-pay

## 2021-05-06 NOTE — Telephone Encounter (Signed)
Called to discuss PREP program; she is interested and prefers [name]; offered next class starting 12/12 every M/W 11:30-12:45; assessment visit scheduled for 12/6 at 10:30

## 2021-05-07 ENCOUNTER — Telehealth: Payer: Self-pay

## 2021-05-07 ENCOUNTER — Encounter: Payer: Self-pay | Admitting: *Deleted

## 2021-05-07 NOTE — Telephone Encounter (Addendum)
Called patient regarding lab results----- Message from Warren Lacy, PA-C sent at 05/04/2021  1:43 PM EST ----- Cholesterol beautiful.  HDL (good cholesterol) is high and LDL (bad cholesterol) is low.  Continue with healthy eating and exercise.

## 2021-05-12 NOTE — Progress Notes (Signed)
[facility] PREP Evaluation  Patient Details  Name: [name] MRN: [id number] Date of Birth: 08-11-1940 Age: 80 y.o. PCP: [facility] [place]  Vitals:   05/12/21 1154  BP: 104/70  Pulse: 88  SpO2: 94%  Weight: 142 lb 12.8 oz (64.8 kg)     [facility] Eval - 05/12/21 1100       [facility] "PREP" Location   [facility] "PREP" Location [facility]      Referral    Referring Provider Quentin Ore    Reason for referral Heart Failure;Inactivity;Hypertension    Program Start Date 05/18/21      Measurement   Waist Circumference 32.5 inches    Hip Circumference 41 inches    Body fat 43.3 percent      Information for Trainer   Goals --   Lose 10 lbs by end of program so she can breath easier; re-establish exercise routine   Current Exercise none    Orthopedic Concerns --   bilat TKR; OA   Pertinent Medical History --   CHF, HTN, asthma, s/p L mastectomy   Current Barriers --   weather   Medications that affect exercise Asthma inhaler      Timed Up and Go (TUGS)   Timed Up and Go Low risk <9 seconds      Mobility and Daily Activities   I find it easy to walk up or down two or more flights of stairs. 1    I have no trouble taking out the trash. 1    I do housework such as vacuuming and dusting on my own without difficulty. 3    I can easily lift a gallon of milk (8lbs). 3    I can easily walk a mile. 1    I have no trouble reaching into high cupboards or reaching down to pick up something from the floor. 1    I do not have trouble doing out-door work such as Armed forces logistics/support/administrative officer, raking leaves, or gardening. 4      Mobility and Daily Activities   I feel younger than my age. 4    I feel independent. 4    I feel energetic. 2    I live an active life.  1    I feel strong. 1    I feel healthy. 3    I feel active as other people my age. 1      How fit and strong are you.   Fit and Strong Total Score 30            Past Medical History:  Diagnosis Date   A-fib ([place])     Anemia    history of   Anxiety    Arthritis    Asthma    Asthma    Breast cancer ([place])    Cancer ([place])    skin cancers, one melanoma    Complication of anesthesia    patient woke up during colonoscopy   Concussion    8 yrs. ago due to being thrown from a horse   Dislocated elbow 10/12/2016   left   Dysrhythmia 12/2016   A-fib   Dysrhythmia    Family history of breast cancer    GERD (gastroesophageal reflux disease)    History of kidney stones    History of radiation therapy 03/23/17-05/04/17   right chest wall 50.4 Gy in 28 fractions, right axilla 45 Gy in 25 fractions   Hypertension    Hypertensive heart  disease without CHF    Keratosis, actinic    Melanoma ([place])    Persistent atrial fibrillation ([place]) 12/16/2016   CHA2DS2VASC score 3   Pneumonia    hx of    Past Surgical History:  Procedure Laterality Date   brachial skin removal due to deforimity Bilateral    BREAST ENHANCEMENT SURGERY Left 11/14/2018   Procedure: LEFT BREAST AUGMENTATION;  Surgeon: [name], MD;  Location: [place];  Service: Plastics;  Laterality: Left;   BREAST RECONSTRUCTION Right 06/26/2018   BREAST RECONSTRUCTION WITH PLACEMENT OF TISSUE EXPANDER AND ALLODERM Right 06/26/2018   Procedure: RIGHT BREAST RECONSTRUCTION WITH PLACEMENT OF TISSUE EXPANDER;  Surgeon: [name], MD;  Location: [place];  Service: Plastics;  Laterality: Right;   BREAST RECONSTRUCTION WITH PLACEMENT OF TISSUE EXPANDER AND FLEX HD (ACELLULAR HYDRATED DERMIS) Right 01/28/2017    RIGHT BREAST RECONSTRUCTION WITH PLACEMENT OF TISSUE EXPANDER AND ALLODERM;  Surgeon: [name], MD;  Location: [place];  Service: Plastics;  Laterality: Right;   BUNIONECTOMY     left    CATARACT EXTRACTION     bilaterla cataract surgery    COLONOSCOPY W/ POLYPECTOMY     JOINT REPLACEMENT     KNEE ARTHROSCOPY     left    LATISSIMUS FLAP TO BREAST Right 06/26/2018   Procedure: LATISSIMUS FLAP TO RIGHT CHEST;   Surgeon: [name], MD;  Location: [place];  Service: Plastics;  Laterality: Right;   MASTECTOMY Right    MASTECTOMY W/ SENTINEL NODE BIOPSY Right 01/28/2017   Procedure: RIGHT TOTAL MASTECTOMY WITH SENTINEL LYMPH NODE BIOPSY;  Surgeon: Excell Seltzer, MD;  Location: [place];  Service: General;  Laterality: Right;   MASTOPEXY Left 11/14/2018   Procedure: LEFT BREAST MASTOPEXY;  Surgeon: [name], MD;  Location: [place];  Service: Plastics;  Laterality: Left;   MELANOMA EXCISION Left    polyp removed from uterus      REMOVAL OF TISSUE EXPANDER AND PLACEMENT OF IMPLANT Right 11/14/2018   Procedure: REMOVAL OF RIGHT TISSUE EXPANDER AND PLACEMENT OF SALINE IMPLANT;  Surgeon: [name], MD;  Location: [place];  Service: Plastics;  Laterality: Right;   right wrist pinning      skin cancers removed     TISSUE EXPANDER PLACEMENT Right 02/27/2018   Procedure: REMOVAL OF TISSUE EXPANDER;  Surgeon: [name], MD;  Location: [place];  Service: Plastics;  Laterality: Right;   TOTAL KNEE ARTHROPLASTY Right 01/29/2013   Procedure: RIGHT TOTAL KNEE ARTHROPLASTY;  Surgeon: Gearlean Alf, MD;  [facility];  Service: Orthopedics;  Laterality: Right;   TOTAL KNEE ARTHROPLASTY Left 08/29/2017   Procedure: LEFT TOTAL KNEE ARTHROPLASTY;  Surgeon: Gaynelle Arabian, MD;  [facility];  Service: Orthopedics;  Laterality: Left;   Social History   Tobacco Use  Smoking Status Never  Smokeless Tobacco Never  To begin PREP class at [facility] starting 12/12, every M/W 11:30-12:45  Annalyn Blecher B Jaidev Sanger 05/12/2021, 11:58 AM

## 2021-05-13 ENCOUNTER — Ambulatory Visit: Payer: Medicare Other | Admitting: Cardiology

## 2021-05-18 NOTE — Progress Notes (Signed)
[facility] PREP Weekly Session  Patient Details  Name: [name] MRN: [id number] Date of Birth: 1940/10/18 Age: 80 y.o. PCP: [facility] [place] [facility] [place]  There were no vitals filed for this visit.   [facility] Weekly seesion - 05/18/21 1300       [facility] "PREP" Location   [facility] "PREP" Location [facility]      Weekly Session   Topic Discussed Goal setting and welcome to the program   tour of facility; 10 minute cardio warm up/introduction to machine            Kayson Bullis B Vela Render 05/18/2021, 1:18 PM

## 2021-05-29 ENCOUNTER — Other Ambulatory Visit: Payer: Self-pay | Admitting: Oncology

## 2021-05-29 DIAGNOSIS — C50211 Malignant neoplasm of upper-inner quadrant of right female breast: Secondary | ICD-10-CM

## 2021-06-08 NOTE — Progress Notes (Signed)
[facility] PREP Weekly Session  Patient Details  Name: [name] MRN: [id number] Date of Birth: July 05, 1940 Age: 81 y.o. PCP: [facility] [place]  Vitals:   06/08/21 1503  Weight: 140 lb (63.5 kg)     [facility] Weekly seesion - 06/08/21 1500       [facility] "PREP" Location   [facility] "PREP" Location [facility]      Weekly Session   Topic Discussed Health habits    Minutes exercised this week 90 minutes    Classes attended to date [place] 06/08/2021, 3:04 PM

## 2021-06-15 NOTE — Progress Notes (Signed)
[facility] PREP Weekly Session  Patient Details  Name: [name] MRN: [id number] Date of Birth: [birth year] Age: 81 y.o. PCP: [facility] [place]  Vitals:   06/15/21 1302  Weight: 141 lb (64 kg)     [facility] Weekly seesion - 06/15/21 1300       [facility] "PREP" Location   [facility] "PREP" Location [facility]      Weekly Session   Topic Discussed Healthy eating tips   sugar demo   Minutes exercised this week 120 minutes    Classes attended to date [place] 06/15/2021, 1:03 PM

## 2021-06-17 ENCOUNTER — Telehealth: Payer: Self-pay

## 2021-06-17 NOTE — Telephone Encounter (Signed)
° °  Pre-operative Risk Assessment    Patient Name: [name]  DOB: 1941/03/12 MRN: [id number]      Request for Surgical Clearance    Procedure:   Colonoscopy   Date of Surgery:  Clearance 07/15/21                                 Surgeon:  Dr. Therisa Doyne Surgeon's Group or Practice Name:  [facility]  Phone number:  [phone] Fax number:  [phone]   Type of Clearance Requested:   - Pharmacy:  Hold Rivaroxaban (Xarelto) 3 days    Type of Anesthesia:   Propofol    Additional requests/questions:    Tana Conch   06/17/2021, 1:08 PM

## 2021-06-17 NOTE — Telephone Encounter (Addendum)
Duplicate clearance request, previously cleared 03/31/21 note. No clinical changes since then. Agree with prior clearance rec to hold Xarelto for 2 days prior to colonoscopy.

## 2021-06-17 NOTE — Telephone Encounter (Signed)
Clinical pharmacist to review Xarelto 

## 2021-06-18 NOTE — Telephone Encounter (Signed)
° °  Primary Cardiologist: Kirk Ruths, MD  Chart reviewed as part of pre-operative protocol coverage by clinical pharmacist.  [name] may hold Xarelto as directed below:  Patient with diagnosis of afib on Xarelto for anticoagulation.     Procedure: colonoscopy Date of procedure: 07/15/21     CHA2DS2-VASc Score = 6  This indicates a 9.7% annual risk of stroke. The patient's score is based upon: CHF History: 1 HTN History: 1 Diabetes History: 0 Stroke History: 0 Vascular Disease History: 1 Age Score: 2 Gender Score: 1    CrCl 51.72 mL/min using adjusted body weight Platelet count 203K  I will route this recommendation to the requesting party via [place] fax function and remove from pre-op pool.  Please call with questions.  Emmaline Life, NP-C    06/18/2021, 11:58 AM [place] [address] Office [phone] Fax [phone]

## 2021-06-22 NOTE — Progress Notes (Signed)
[facility] PREP Weekly Session  Patient Details  Name: [name] MRN: [id number] Date of Birth: 04-16-1941 Age: 81 y.o. PCP: [facility] [place]  Vitals:   06/22/21 1249  Weight: 139 lb 9.6 oz (63.3 kg)     [facility] Weekly seesion - 06/22/21 1200       [facility] "PREP" Location   [facility] "PREP" Location [facility]      Weekly Session   Topic Discussed Restaurant Eating   Salt demo; limit salt intake 1500-2300 mg/day   Minutes exercised this week 385 minutes    Classes attended to date [place] 06/22/2021, 12:51 PM

## 2021-06-29 NOTE — Progress Notes (Signed)
[facility] PREP Weekly Session  Patient Details  Name: [name] MRN: [id number] Date of Birth: November 12, 1940 Age: 81 y.o. PCP: [facility] [place]  Vitals:   06/29/21 1308  Weight: 139 lb 4.8 oz (63.2 kg)     [facility] Weekly seesion - 06/29/21 1300       [facility] "PREP" Location   [facility] "PREP" Location [facility]      Weekly Session   Topic Discussed Stress management and problem solving   fingertip/breath work Advertising account planner; fruit bowl guided meditation   Classes attended to date [place] 06/29/2021, 1:09 PM

## 2021-07-06 NOTE — Progress Notes (Signed)
[facility] PREP Weekly Session  Patient Details  Name: [name] MRN: [id number] Date of Birth: 02-02-1941 Age: 81 y.o. PCP: [facility] [place]  Vitals:   07/06/21 1526  Weight: 139 lb 12.8 oz (63.4 kg)     [facility] Weekly seesion - 07/06/21 1500       [facility] "PREP" Location   [facility] "PREP" Location [facility]      Weekly Session   Topic Discussed Expectations and non-scale victories   Reminder to do 6 week review, revisit, restate health and fitness goals set at start of program.   Minutes exercised this week 150 minutes    Classes attended to date [place] 07/06/2021, 3:27 PM

## 2021-07-13 NOTE — Progress Notes (Signed)
[facility] PREP Weekly Session  Patient Details  Name: [name] MRN: [id number] Date of Birth: [birth year] Age: 81 y.o. PCP: [facility] [place]  Vitals:   07/13/21 1242  Weight: 139 lb (63 kg)     [facility] Weekly seesion - 07/13/21 1200       [facility] "PREP" Location   [facility] "PREP" Location [facility]      Weekly Session   Topic Discussed Other   Portion size matters; visualize your portion size demo; review of food labels   Minutes exercised this week 130 minutes    Classes attended to date [place] 07/13/2021, 12:43 PM

## 2021-07-20 NOTE — Progress Notes (Signed)
[facility] PREP Weekly Session  Patient Details  Name: [name] MRN: [id number] Date of Birth: 1940/08/02 Age: 81 y.o. PCP: [facility] [place]  Vitals:   07/20/21 1256  Weight: 139 lb (63 kg)     [facility] Weekly seesion - 07/20/21 1200       [facility] "PREP" Location   [facility] "PREP" Location [facility]      Weekly Session   Topic Discussed Finding support   Review of several food labels, [employer]UK[employer] app   Minutes exercised this week 65 minutes    Classes attended to date [place] 07/20/2021, 12:58 PM

## 2021-07-27 NOTE — Progress Notes (Signed)
[facility] PREP Weekly Session  Patient Details  Name: [name] MRN: [id number] Date of Birth: [birth year] Age: 81 y.o. PCP: [facility] [place]  Vitals:   07/27/21 1249  Weight: 139 lb (63 kg)     [facility] Weekly seesion - 07/27/21 1200       [facility] "PREP" Location   [facility] "PREP" Location [facility]      Weekly Session   Topic Discussed Calorie breakdown   Membership talk by [facility] staff; Plan to next 90 days after this program ends; importance of carbs/fats/protein/quality and nutrient dense foods   Minutes exercised this week 75 minutes    Classes attended to date [place] 07/27/2021, 12:50 PM

## 2021-08-03 NOTE — Progress Notes (Signed)
[facility] PREP Weekly Session  Patient Details  Name: [name] MRN: [id number] Date of Birth: 1941-03-15 Age: 81 y.o. PCP: [facility] [place]  Vitals:   08/03/21 1539  Weight: 139 lb 14.4 oz (63.5 kg)     [facility] Weekly seesion - 08/03/21 1500       [facility] "PREP" Location   [facility] "PREP" Location [facility]      Weekly Session   Topic Discussed Hitting roadblocks   Reviewed goals and activity plan for next 90 days after PRPE classes end; to bring to final assessment visit next week.   Minutes exercised this week 95 minutes    Classes attended to date [place] 08/03/2021, 3:40 PM

## 2021-08-10 NOTE — Progress Notes (Signed)
[facility] PREP Weekly Session ? ?Patient Details  ?Name: SOPHIANA MILANESE ?MRN: [id number] ?Date of Birth: September 12, 1940 ?Age: 81 y.o. ?PCP: [facility] [place] ? ?Vitals:  ? 08/10/21 1238  ?Weight: 141 lb 3.2 oz (64 kg)  ? ? ? [facility] Weekly seesion - 08/10/21 1200   ? ?  ? [facility] "PREP" Location  ? [facility]PREP[facility] Location [facility]   ?  ? Weekly Session  ? Topic Discussed Other   Fit testing completed, final assessment visit scheduled for Wednesday; to bring goals and acitivity plan and PREP survey to final visit.  ? Minutes exercised this week 65 minutes   ? Classes attended to date 27   ? ?  ?  ? ?  ? ? ?Zachery Dakins Eliane Hammersmith ?08/10/2021, 12:39 PM ? ? ?

## 2021-08-12 NOTE — Progress Notes (Signed)
[facility] PREP Evaluation ? ?Patient Details  ?Name: [name] ?MRN: [id number] ?Date of Birth: July 14, 1940 ?Age: 81 y.o. ?PCP: [facility] [place] ? ?Vitals:  ? 08/12/21 1245  ?BP: (!) 138/99  ?Pulse: 83  ?SpO2: 94%  ?Weight: 140 lb 3.2 oz (63.6 kg)  ? ? ? [facility] Eval - 08/12/21 1200   ? ?  ? [facility] "PREP" Location  ? [facility]PREP[facility] Location [facility]   ?  ? Referral   ? Program Start Date 08/12/21   Program end date  ?  ? Measurement  ? Waist Circumference 33 inches   ? Hip Circumference 40 inches   ? Body fat 42.9 percent   ?  ? Mobility and Daily Activities  ? I find it easy to walk up or down two or more flights of stairs. 1   ? I have no trouble taking out the trash. 4   ? I do housework such as vacuuming and dusting on my own without difficulty. 3   ? I can easily lift a gallon of milk (8lbs). 4   ? I can easily walk a mile. 1   ? I have no trouble reaching into high cupboards or reaching down to pick up something from the floor. 2   ? I do not have trouble doing out-door work such as Armed forces logistics/support/administrative officer, raking leaves, or gardening. 1   ?  ? Mobility and Daily Activities  ? I feel younger than my age. 4   ? I feel independent. 3   ? I feel energetic. 4   ? I live an active life.  2   ? I feel strong. 4   ? I feel healthy. 3   ? I feel active as other people my age. 1   ?  ? How fit and strong are you.  ? Fit and Strong Total Score 37   ? ?  ?  ? ?  ? ?Past Medical History:  ?Diagnosis Date  ? A-fib ([place])   ? Anemia   ? history of  ? Anxiety   ? Arthritis   ? Asthma   ? Asthma   ? Breast cancer ([place])   ? Cancer [facility])   ? skin cancers, one melanoma   ? Complication of anesthesia   ? patient woke up during colonoscopy  ? Concussion   ? 8 yrs. ago due to being thrown from a horse  ? Dislocated elbow 10/12/2016  ? left  ? Dysrhythmia 12/2016  ? A-fib  ? Dysrhythmia   ? Family history of breast cancer   ? GERD (gastroesophageal reflux disease)   ? History of kidney stones   ? History of radiation  therapy 03/23/17-05/04/17  ? right chest wall 50.4 Gy in 28 fractions, right axilla 45 Gy in 25 fractions  ? Hypertension   ? Hypertensive heart disease without CHF   ? Keratosis, actinic   ? Melanoma ([place])   ? Persistent atrial fibrillation ([place]) 12/16/2016  ? CHA2DS2VASC score 3  ? Pneumonia   ? hx of   ? ?Past Surgical History:  ?Procedure Laterality Date  ? brachial skin removal due to deforimity Bilateral   ? BREAST ENHANCEMENT SURGERY Left 11/14/2018  ? Procedure: LEFT BREAST AUGMENTATION;  Surgeon: [name], MD;  Location: [place];  Service: Plastics;  Laterality: Left;  ? BREAST RECONSTRUCTION Right 06/26/2018  ? BREAST RECONSTRUCTION WITH PLACEMENT OF TISSUE EXPANDER AND ALLODERM Right 06/26/2018  ?  Procedure: RIGHT BREAST RECONSTRUCTION WITH PLACEMENT OF TISSUE EXPANDER;  Surgeon: [name], MD;  Location: [place];  Service: Plastics;  Laterality: Right;  ? BREAST RECONSTRUCTION WITH PLACEMENT OF TISSUE EXPANDER AND FLEX HD (ACELLULAR HYDRATED DERMIS) Right 01/28/2017  ?  RIGHT BREAST RECONSTRUCTION WITH PLACEMENT OF TISSUE EXPANDER AND ALLODERM;  Surgeon: [name], MD;  Location: [place];  Service: Plastics;  Laterality: Right;  ? BUNIONECTOMY    ? left   ? CATARACT EXTRACTION    ? bilaterla cataract surgery   ? COLONOSCOPY W/ POLYPECTOMY    ? JOINT REPLACEMENT    ? KNEE ARTHROSCOPY    ? left   ? LATISSIMUS FLAP TO BREAST Right 06/26/2018  ? Procedure: LATISSIMUS FLAP TO RIGHT CHEST;  Surgeon: [name], MD;  Location: [place];  Service: Plastics;  Laterality: Right;  ? MASTECTOMY Right   ? MASTECTOMY W/ SENTINEL NODE BIOPSY Right 01/28/2017  ? Procedure: RIGHT TOTAL MASTECTOMY WITH SENTINEL LYMPH NODE BIOPSY;  Surgeon: Excell Seltzer, MD;  Location: [place];  Service: General;  Laterality: Right;  ? MASTOPEXY Left 11/14/2018  ? Procedure: LEFT BREAST MASTOPEXY;  Surgeon: [name], MD;  Location: [place];  Service: Plastics;  Laterality:  Left;  ? MELANOMA EXCISION Left   ? polyp removed from uterus     ? REMOVAL OF TISSUE EXPANDER AND PLACEMENT OF IMPLANT Right 11/14/2018  ? Procedure: REMOVAL OF RIGHT TISSUE EXPANDER AND PLACEMENT OF SALINE IMPLANT;  Surgeon: [name], MD;  Location: [place];  Service: Plastics;  Laterality: Right;  ? right wrist pinning     ? skin cancers removed    ? TISSUE EXPANDER PLACEMENT Right 02/27/2018  ? Procedure: REMOVAL OF TISSUE EXPANDER;  Surgeon: [name], MD;  Location: [place];  Service: Plastics;  Laterality: Right;  ? TOTAL KNEE ARTHROPLASTY Right 01/29/2013  ? Procedure: RIGHT TOTAL KNEE ARTHROPLASTY;  Surgeon: Gearlean Alf, MD;  [facility];  Service: Orthopedics;  Laterality: Right;  ? TOTAL KNEE ARTHROPLASTY Left 08/29/2017  ? Procedure: LEFT TOTAL KNEE ARTHROPLASTY;  Surgeon: Gaynelle Arabian, MD;  Location: [facility];  Service: Orthopedics;  Laterality: Left;  ? ?Social History  ? ?Tobacco Use  ?Smoking Status Never  ?Smokeless Tobacco Never  ?How fit and strong survey: ?05/12/21: 30 ?08/12/21: 37 ?Education sessions completed: 11 ?Workouts completed: 11 ? ? ?[place] ?08/12/2021, 12:47 PM ? ? ?

## 2021-09-02 ENCOUNTER — Encounter (HOSPITAL_BASED_OUTPATIENT_CLINIC_OR_DEPARTMENT_OTHER): Payer: Self-pay

## 2021-09-02 ENCOUNTER — Emergency Department (HOSPITAL_BASED_OUTPATIENT_CLINIC_OR_DEPARTMENT_OTHER): Payer: Medicare Other

## 2021-09-02 ENCOUNTER — Emergency Department (HOSPITAL_BASED_OUTPATIENT_CLINIC_OR_DEPARTMENT_OTHER): Payer: Medicare Other | Admitting: Radiology

## 2021-09-02 ENCOUNTER — Emergency Department (HOSPITAL_BASED_OUTPATIENT_CLINIC_OR_DEPARTMENT_OTHER)
Admission: EM | Admit: 2021-09-02 | Discharge: 2021-09-02 | Disposition: A | Discharge status: Home / Self Care | Payer: Medicare Other | Attending: Emergency Medicine | Admitting: Emergency Medicine

## 2021-09-02 ENCOUNTER — Other Ambulatory Visit: Payer: Self-pay

## 2021-09-02 DIAGNOSIS — Z96653 Presence of artificial knee joint, bilateral: Secondary | ICD-10-CM | POA: Diagnosis not present

## 2021-09-02 DIAGNOSIS — S52124A Nondisplaced fracture of head of right radius, initial encounter for closed fracture: Secondary | ICD-10-CM

## 2021-09-02 DIAGNOSIS — Y92096 Garden or yard of other non-institutional residence as the place of occurrence of the external cause: Secondary | ICD-10-CM | POA: Insufficient documentation

## 2021-09-02 DIAGNOSIS — S92352A Displaced fracture of fifth metatarsal bone, left foot, initial encounter for closed fracture: Secondary | ICD-10-CM

## 2021-09-02 DIAGNOSIS — I4891 Unspecified atrial fibrillation: Secondary | ICD-10-CM | POA: Diagnosis not present

## 2021-09-02 DIAGNOSIS — W010XXA Fall on same level from slipping, tripping and stumbling without subsequent striking against object, initial encounter: Secondary | ICD-10-CM | POA: Diagnosis not present

## 2021-09-02 DIAGNOSIS — I10 Essential (primary) hypertension: Secondary | ICD-10-CM | POA: Insufficient documentation

## 2021-09-02 DIAGNOSIS — Z853 Personal history of malignant neoplasm of breast: Secondary | ICD-10-CM | POA: Insufficient documentation

## 2021-09-02 DIAGNOSIS — J45909 Unspecified asthma, uncomplicated: Secondary | ICD-10-CM | POA: Insufficient documentation

## 2021-09-02 DIAGNOSIS — S99922A Unspecified injury of left foot, initial encounter: Secondary | ICD-10-CM | POA: Diagnosis present

## 2021-09-02 DIAGNOSIS — Z85828 Personal history of other malignant neoplasm of skin: Secondary | ICD-10-CM | POA: Insufficient documentation

## 2021-09-02 DIAGNOSIS — S0990XA Unspecified injury of head, initial encounter: Secondary | ICD-10-CM | POA: Diagnosis not present

## 2021-09-02 DIAGNOSIS — Z79899 Other long term (current) drug therapy: Secondary | ICD-10-CM | POA: Diagnosis not present

## 2021-09-02 MED ORDER — OXYCODONE HCL 5 MG PO TABS: 5.0000 mg | ORAL_TABLET | ORAL | 0 refills | Status: AC | PRN

## 2021-09-02 MED ORDER — OXYCODONE HCL 5 MG PO TABS
5.0000 mg | ORAL_TABLET | Freq: Once | ORAL | Status: AC
  Administered 2021-09-02: 5 mg via ORAL
  Filled 2021-09-02: qty 1

## 2021-09-02 MED ORDER — ONDANSETRON 4 MG PO TBDP
4.0000 mg | ORAL_TABLET | Freq: Once | ORAL | Status: AC
  Administered 2021-09-02: 4 mg via ORAL
  Filled 2021-09-02: qty 1

## 2021-09-02 NOTE — ED Provider Notes (Signed)
?[facility] EMERGENC[facility] DEPT ?Provider Note ? ? ?CSN: [id number] ?Arrival date & time: 09/02/21  1534 ? ?  ? ?History ? ?Chief Complaint  ?Patient presents with  ? Fall  ? Ankle Pain  ? Knee Injury  ? Head Injury  ? ? ?[name] is a 81 y.o. female. ? ?HPI ? ?  ? ?81yo female with history of atrial fibrillation on xarelto, breast cancer, melanoma, hypertension, who presents with concern for fall with left foot pain and right elbow pain.  ? ?Going to feed birds, mistepped twisted left ankle, fell on right arm outstretched and hit right side of head ?Went to urgent care this AM-occurred yesterday evening 630PM  ? ?Headache comes in waves ?No neck pain, numbness, weakness ? ?Left foot with pain, no ankle pain ?Right elbow pain ,  ?6/10 pain ? ?No CP or dyspnea, no abdominal pain or hip pain ?No nausea or vomiting ? ?Has been taking  xarelto ? ? ? ? ?Past Medical History:  ?Diagnosis Date  ? A-fib ([place])   ? Anemia   ? history of  ? Anxiety   ? Arthritis   ? Asthma   ? Asthma   ? Breast cancer ([place])   ? Cancer [facility])   ? skin cancers, one melanoma   ? Complication of anesthesia   ? patient woke up during colonoscopy  ? Concussion   ? 8 yrs. ago due to being thrown from a horse  ? Dislocated elbow 10/12/2016  ? left  ? Dysrhythmia 12/2016  ? A-fib  ? Dysrhythmia   ? Family history of breast cancer   ? GERD (gastroesophageal reflux disease)   ? History of kidney stones   ? History of radiation therapy 03/23/17-05/04/17  ? right chest wall 50.4 Gy in 28 fractions, right axilla 45 Gy in 25 fractions  ? Hypertension   ? Hypertensive heart disease without CHF   ? Keratosis, actinic   ? Melanoma ([place])   ? Persistent atrial fibrillation ([place]) 12/16/2016  ? CHA2DS2VASC score 3  ? Pneumonia   ? hx of   ?  ?Past Surgical History:  ?Procedure Laterality Date  ? brachial skin removal due to deforimity Bilateral   ? BREAST ENHANCEMENT SURGERY Left 11/14/2018  ? Procedure: LEFT BREAST AUGMENTATION;  Surgeon: [name], MD;  Location: [place];  Service: Plastics;  Laterality: Left;  ? BREAST RECONSTRUCTION Right 06/26/2018  ? BREAST RECONSTRUCTION WITH PLACEMENT OF TISSUE EXPANDER AND ALLODERM Right 06/26/2018  ? Procedure: RIGHT BREAST RECONSTRUCTION WITH PLACEMENT OF TISSUE EXPANDER;  Surgeon: [name], MD;  Location: [place];  Service: Plastics;  Laterality: Right;  ? BREAST RECONSTRUCTION WITH PLACEMENT OF TISSUE EXPANDER AND FLEX HD (ACELLULAR HYDRATED DERMIS) Right 01/28/2017  ?  RIGHT BREAST RECONSTRUCTION WITH PLACEMENT OF TISSUE EXPANDER AND ALLODERM;  Surgeon: [name], MD;  Location: [place];  Service: Plastics;  Laterality: Right;  ? BUNIONECTOMY    ? left   ? CATARACT EXTRACTION    ? bilaterla cataract surgery   ? COLONOSCOPY W/ POLYPECTOMY    ? JOINT REPLACEMENT    ? KNEE ARTHROSCOPY    ? left   ? LATISSIMUS FLAP TO BREAST Right 06/26/2018  ? Procedure: LATISSIMUS FLAP TO RIGHT CHEST;  Surgeon: [name], MD;  Location: [place];  Service: Plastics;  Laterality: Right;  ? MASTECTOMY Right   ? MASTECTOMY W/ SENTINEL NODE BIOPSY Right 01/28/2017  ? Procedure: RIGHT TOTAL MASTECTOMY WITH SENTINEL LYMPH NODE BIOPSY;  Surgeon: Excell Seltzer, MD;  Location: [place];  Service: General;  Laterality: Right;  ? MASTOPEXY Left 11/14/2018  ? Procedure: LEFT BREAST MASTOPEXY;  Surgeon: [name], MD;  Location: [place];  Service: Plastics;  Laterality: Left;  ? MELANOMA EXCISION Left   ? polyp removed from uterus     ? REMOVAL OF TISSUE EXPANDER AND PLACEMENT OF IMPLANT Right 11/14/2018  ? Procedure: REMOVAL OF RIGHT TISSUE EXPANDER AND PLACEMENT OF SALINE IMPLANT;  Surgeon: [name], MD;  Location: [place];  Service: Plastics;  Laterality: Right;  ? right wrist pinning     ? skin cancers removed    ? TISSUE EXPANDER PLACEMENT Right 02/27/2018  ? Procedure: REMOVAL OF TISSUE EXPANDER;  Surgeon: [name], MD;  Location: [place];   Service: Plastics;  Laterality: Right;  ? TOTAL KNEE ARTHROPLASTY Right 01/29/2013  ? Procedure: RIGHT TOTAL KNEE ARTHROPLASTY;  Surgeon: Gearlean Alf, MD;  [facility];  Service: Orthopedics;  Laterality: Right;  ? TOTAL KNEE ARTHROPLASTY Left 08/29/2017  ? Procedure: LEFT TOTAL KNEE ARTHROPLASTY;  Surgeon: Gaynelle Arabian, MD;  [facility];  Service: Orthopedics;  Laterality: Left;  ?  ?Home Medications ?Prior to Admission medications   ?Medication Sig Start Date End Date Taking? Authorizing Provider  ?oxyCODONE (ROXICODONE) 5 MG immediate release tablet Take 1 tablet (5 mg total) by mouth every 4 (four) hours as needed for severe pain. 09/02/21  Yes Gareth Morgan, MD  ?albuterol (VENTOLIN HFA) 108 (90 Base) MCG/ACT inhaler Inhale 1 puff into the lungs every 6 (six) hours as needed for wheezing or shortness of breath.    [provider]  ?ALPRAZolam Duanne Moron) 0.5 MG tablet Take 0.5 mg by mouth daily as needed for anxiety.     [provider]  ?B Complex Vitamins (VITAMIN B COMPLEX PO) Take 1 capsule by mouth daily.    [provider]  ?[place] Patter 100-25 MCG/INH AEPB Inhale 1 puff into the lungs daily. 12/22/16   [provider]  ?COLLAGEN PO Take 1 capsule by mouth daily.    [provider]  ?desonide (DESOWEN) 0.05 % cream Apply 1 application topically 2 (two) times daily as needed (for skin irritation.).    [provider]  ?diclofenac sodium (VOLTAREN) 1 % GEL Apply 2 g topically daily as needed (pain).     [provider]  ?EPINEPHrine 0.3 mg/0.3 mL IJ SOAJ injection Inject 0.3 mg into the muscle as needed for anaphylaxis.    [provider]  ?famotidine (PEPCID) 20 MG tablet Take 1 tablet by mouth as directed. 06/29/19   [provider]  ?fluorouracil (EFUDEX) 5 % cream Apply 1 application topically as directed. Per MD instructions, for skin cancer 12/03/20   [provider]  ?fluticasone (FLONASE) 50 MCG/ACT  nasal spray Place 2 sprays into both nostrils daily.    [provider]  ?furosemide (LASIX) 20 MG tablet Take 20 mg by mouth daily as needed.    [provider]  ?irbesartan (AVAPRO) 150 MG tablet Take 1 tablet (150 mg total) by mouth daily. 04/10/21   Lelon Perla, MD  ?letrozole [facility]) 2.5 MG tablet TAKE 1 TABLET BY MOUTH EVERY DAY 05/29/21   Magrinat, Virgie Dad, MD  ?levocetirizine (XYZAL) 5 MG tablet Take 5 mg by mouth at bedtime. 09/04/20   [provider]  ?Methylsulfonylmethane (MSM PO) Take 1 capsule by mouth daily.    [provider]  ?MILK THISTLE PO  Take 1 capsule by mouth daily.    [provider]  ?Misc Natural Products (PYCNOGENOL COMPLEX PO) Take 1 capsule by mouth daily.    [provider]  ?Omega-3 Fatty Acids (OMEGA 3 PO) Take 1 capsule by mouth daily.    [provider]  ?OVER THE COUNTER MEDICATION Take 1 capsule by mouth daily. Immunity supplement    [provider]  ?Polyethyl Glycol-Propyl Glycol (SYSTANE OP) Place 1 drop into both eyes daily.    [provider]  ?potassium chloride (KLOR-CON) 20 MEQ packet Take 20 mEq by mouth daily as needed.    [provider]  ?propranolol ER (INDERAL LA) 80 MG 24 hr capsule Take 80 mg by mouth daily.    [provider]  ?TURMERIC PO Take 1 capsule by mouth daily.    [provider]  ?venlafaxine XR (EFFEXOR-XR) 75 MG 24 hr capsule Take 75 mg by mouth daily with breakfast.    [provider]  ?VITAMIN D PO Take 1 capsule by mouth daily.    [provider]  ?XARELTO 20 MG TABS tablet TAKE 1 TABLET BY MOUTH DAILY WITH SUPPER 03/23/21   Warren Lacy, PA-C  ?   ? ?Allergies    ?Chlorhexidine gluconate, Doxycycline, Codeine, Doxycycline, Nsaids, Nsaids, Tylenol [acetaminophen], Adhesive [tape], Chlorhexidine, Codeine, and Tylenol [acetaminophen]   ? ?Review of Systems   ?Review of Systems ?See above ?Physical Exam ?Updated  Vital Signs ?BP (!) 135/97   Pulse (!) 103   Temp 97.8 ?F (36.6 ?C)   Resp 18   SpO2 94%  ?Physical Exam ?Vitals and nursing note reviewed.  ?Constitutional:   ?   General: She is not in acute distress. ?   Appe

## 2021-09-02 NOTE — ED Notes (Signed)
Walked in Pt's room and Pt was in the bathroom vomiting. Pt stated she has vomited x1. ?

## 2021-09-02 NOTE — ED Triage Notes (Signed)
She tells me that she tripped while in her yard attempting to fill her bird feeder. She has bruising and pan at right forehead, right knee and left ankle. She is alert and in no distress. Her husband is with her. ?

## 2021-09-10 ENCOUNTER — Other Ambulatory Visit: Payer: Self-pay | Admitting: Family Medicine

## 2021-09-10 DIAGNOSIS — S52124D Nondisplaced fracture of head of right radius, subsequent encounter for closed fracture with routine healing: Secondary | ICD-10-CM

## 2021-09-29 ENCOUNTER — Other Ambulatory Visit: Payer: Self-pay

## 2021-09-29 ENCOUNTER — Other Ambulatory Visit: Payer: Self-pay | Admitting: Physician Assistant

## 2021-09-29 DIAGNOSIS — I4819 Other persistent atrial fibrillation: Secondary | ICD-10-CM

## 2021-09-29 MED ORDER — VENLAFAXINE HCL ER 75 MG PO CP24
75.0000 mg | ORAL_CAPSULE | Freq: Every day | ORAL | 0 refills | Status: DC
Start: 2021-09-29 — End: 2021-12-30

## 2021-09-29 NOTE — Telephone Encounter (Signed)
Prescription refill request for Xarelto received.  ?Indication: Afib  ?Last office visit: 04/28/21 Quentin Ore) ?Weight: 63.6kg ?Age: 81 ?Scr: 0.70 (05/04/21)  ?CrCl: 66.17m/min ? ?Appropriate dose and refill sent to requested pharmacy.  ?

## 2021-11-16 NOTE — Progress Notes (Deleted)
Cardiology Clinic Note   Patient Name: SHANDREKA DANTE Date of Encounter: 11/16/2021  Primary Care Provider:  [facility] Primary Cardiologist:  Kirk Ruths, MD  Patient Profile    KALIN AMRHEIN 81 year old female presents to the clinic today for follow-up evaluation of her persistent atrial fibrillation and CHF.  Past Medical History    Past Medical History:  Diagnosis Date   A-fib ([place])    Anemia    history of   Anxiety    Arthritis    Asthma    Asthma    Breast cancer ([place])    Cancer ([place])    skin cancers, one melanoma    Complication of anesthesia    patient woke up during colonoscopy   Concussion    8 yrs. 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    Keratosis, actinic    Melanoma ([place])    Persistent atrial fibrillation ([place]) 12/16/2016   CHA2DS2VASC score 3   Pneumonia    hx of    Past Surgical History:  Procedure Laterality Date   brachial skin removal due to deforimity Bilateral    BREAST ENHANCEMENT SURGERY Left 11/14/2018   Procedure: LEFT BREAST AUGMENTATION;  Surgeon: [name], MD;  Location: [place];  Service: Plastics;  Laterality: Left;   BREAST RECONSTRUCTION Right 06/26/2018   BREAST RECONSTRUCTION WITH PLACEMENT OF TISSUE EXPANDER AND ALLODERM Right 06/26/2018   Procedure: RIGHT BREAST RECONSTRUCTION WITH PLACEMENT OF TISSUE EXPANDER;  Surgeon: [name], MD;  Location: [place];  Service: Plastics;  Laterality: Right;   BREAST RECONSTRUCTION WITH PLACEMENT OF TISSUE EXPANDER AND FLEX HD (ACELLULAR HYDRATED DERMIS)  Right 01/28/2017    RIGHT BREAST RECONSTRUCTION WITH PLACEMENT OF TISSUE EXPANDER AND ALLODERM;  Surgeon: [name], MD;  Location: [place];  Service: Plastics;  Laterality: Right;   BUNIONECTOMY     left    CATARACT EXTRACTION     [place] cataract surgery    COLONOSCOPY W/ POLYPECTOMY     JOINT REPLACEMENT     KNEE ARTHROSCOPY     left    LATISSIMUS FLAP TO BREAST Right 06/26/2018   Procedure: LATISSIMUS FLAP TO RIGHT CHEST;  Surgeon: [name], MD;  Location: [place];  Service: Plastics;  Laterality: Right;   MASTECTOMY Right    MASTECTOMY W/ SENTINEL NODE BIOPSY Right 01/28/2017   Procedure: RIGHT TOTAL MASTECTOMY WITH SENTINEL LYMPH NODE BIOPSY;  Surgeon: Excell Seltzer, MD;  Location: [place];  Service: General;  Laterality: Right;   MASTOPEXY Left 11/14/2018   Procedure: LEFT BREAST MASTOPEXY;  Surgeon: [name], MD;  Location: [place];  Service: Plastics;  Laterality: Left;   MELANOMA EXCISION Left    polyp removed from uterus      REMOVAL OF TISSUE EXPANDER AND PLACEMENT OF IMPLANT Right 11/14/2018   Procedure: REMOVAL OF RIGHT TISSUE EXPANDER AND PLACEMENT OF SALINE IMPLANT;  Surgeon: [name], MD;  Location: [place];  Service: Plastics;  Laterality: Right;   right wrist pinning  skin cancers removed     TISSUE EXPANDER PLACEMENT Right 02/27/2018   Procedure: REMOVAL OF TISSUE EXPANDER;  Surgeon: [name], MD;  Location: [place];  Service: Plastics;  Laterality: Right;   TOTAL KNEE ARTHROPLASTY Right 01/29/2013   Procedure: RIGHT TOTAL KNEE ARTHROPLASTY;  Surgeon: Gearlean Alf, MD;  Location: [facility];  Service: Orthopedics;  Laterality: Right;   TOTAL KNEE ARTHROPLASTY Left 08/29/2017   Procedure: LEFT TOTAL KNEE ARTHROPLASTY;  Surgeon: Gaynelle Arabian, MD;  Location: [facility];  Service: Orthopedics;  Laterality: Left;    Allergies  Allergies  Allergen Reactions   Chlorhexidine Gluconate Hives, Swelling  and Rash       "ChloraPrep "   Doxycycline Nausea And Vomiting   Codeine Nausea Only and Other (See Comments)    Pt can tolerate when pre-medicated   Doxycycline Nausea And Vomiting   Nsaids     avoid due to gerd   Nsaids Other (See Comments)    Gi bleed   Tylenol [Acetaminophen]     Avoid due to GERD   Adhesive [Tape] Rash    Dermabond   Chlorhexidine Swelling and Rash   Codeine Nausea Only   Tylenol [Acetaminophen] Other (See Comments)    GI bleed    History of Present Illness    DAMEKA YOUNKER has a PMH of CHF, atrial fibrillation, hypotension, asthma, GERD, right breast CA, syncope, generalized anxiety disorder, and syncope.  Her atrial fibrillation is managed with rate control and anticoagulation.  She developed worsening lower extremity edema and dyspnea while on a trip in [place].  Her Maxide was discontinued and she was treated with Lasix.  Her proBNP was 433.  Her echocardiogram 7/22 showed LVEF of 55%, moderate pulmonary hypertension, mild RV dysfunction, mild right ventricular enlargement, moderate left atrial enlargement, mild right atrial enlargement, mild aortic insufficiency.  Her carotid Doppler 7/22 showed 1-39% bilateral stenosis.  She had a episode of syncope 7/22.  At that time her blood pressure was low.  She was in her car and stood and walked to her porch.  She became lightheaded.  Her losartan was discontinued.  She reported that she may have been dehydrated.  On follow-up 9/22 she reported spikes in her blood pressure.  Amlodipine was initiated.  She was seen in follow-up on 04/10/2021 by Dr. [name].  During that time she noted worsening lower extremity swelling.  She did note occasional dyspnea on exertion.  She denied orthopnea PND, exertional chest pain and syncope.  She did note 1 episode of chest tightness while laying down.  Her irbesartan was increased to 150 mg at that time.  She presents to the clinic today for follow-up evaluation states***  *** denies  chest pain, shortness of breath, lower extremity edema, fatigue, palpitations, melena, hematuria, hemoptysis, diaphoresis, weakness, presyncope, syncope, orthopnea, and PND.   Home Medications    Prior to Admission medications   Medication Sig Start Date End Date Taking? Authorizing Provider  albuterol (VENTOLIN HFA) 108 (90 Base) MCG/ACT inhaler Inhale 1 puff into the lungs every 6 (six) hours as needed for wheezing or shortness of breath.    [provider]  ALPRAZolam Duanne Moron) 0.5 MG tablet Take 0.5 mg by mouth daily as needed for anxiety.     [provider]  B Complex Vitamins (VITAMIN B COMPLEX PO) Take 1 capsule by mouth daily.    [provider]  BREO ELLIPTA 100-25 MCG/INH AEPB Inhale 1 puff into the lungs daily. 12/22/16  [provider]  COLLAGEN PO Take 1 capsule by mouth daily.    [provider]  desonide (DESOWEN) 0.05 % cream Apply 1 application topically 2 (two) times daily as needed (for skin irritation.).    [provider]  diclofenac sodium (VOLTAREN) 1 % GEL Apply 2 g topically daily as needed (pain).     [provider]  EPINEPHrine 0.3 mg/0.3 mL IJ SOAJ injection Inject 0.3 mg into the muscle as needed for anaphylaxis.    [provider]  famotidine (PEPCID) 20 MG tablet Take 1 tablet by mouth as directed. 06/29/19   [provider]  fluorouracil (EFUDEX) 5 % cream Apply 1 application topically as directed. Per MD instructions, for skin cancer 12/03/20   [provider]  fluticasone (FLONASE) 50 MCG/ACT nasal spray Place 2 sprays into both nostrils daily.    [provider]  furosemide (LASIX) 20 MG tablet Take 20 mg by mouth daily as needed.    [provider]  irbesartan (AVAPRO) 150 MG tablet Take 1 tablet (150 mg total) by mouth daily. 04/10/21   Lelon Perla, MD  letrozole [facility]) 2.5 MG tablet TAKE 1 TABLET BY MOUTH EVERY DAY 05/29/21   Magrinat, Virgie Dad, MD   levocetirizine (XYZAL) 5 MG tablet Take 5 mg by mouth at bedtime. 09/04/20   [provider]  Methylsulfonylmethane (MSM PO) Take 1 capsule by mouth daily.    [provider]  MILK THISTLE PO Take 1 capsule by mouth daily.    [provider]  Misc Natural Products (PYCNOGENOL COMPLEX PO) Take 1 capsule by mouth daily.    [provider]  Omega-3 Fatty Acids (OMEGA 3 PO) Take 1 capsule by mouth daily.    [provider]  OVER THE COUNTER MEDICATION Take 1 capsule by mouth daily. Immunity supplement    [provider]  oxyCODONE (ROXICODONE) 5 MG immediate release tablet Take 1 tablet (5 mg total) by mouth every 4 (four) hours as needed for severe pain. 09/02/21   Gareth Morgan, MD  Polyethyl Glycol-Propyl Glycol (SYSTANE OP) Place 1 drop into both eyes daily.    [provider]  potassium chloride (KLOR-CON) 20 MEQ packet Take 20 mEq by mouth daily as needed.    [provider]  propranolol ER (INDERAL LA) 80 MG 24 hr capsule Take 80 mg by mouth daily.    [provider]  TURMERIC PO Take 1 capsule by mouth daily.    [provider]  venlafaxine XR (EFFEXOR-XR) 75 MG 24 hr capsule Take 1 capsule (75 mg total) by mouth daily with breakfast. 09/29/21   Benay Pike, MD  VITAMIN D PO Take 1 capsule by mouth daily.    [provider]  XARELTO 20 MG TABS tablet TAKE 1 TABLET BY MOUTH DAILY WITH SUPPER 09/29/21   Vickie Epley, MD    Family History    Family History  Problem Relation Age of Onset   Hypertension Other    COPD Father    Breast cancer Other        dx in her 52s-30s   She indicated that her mother is deceased. She indicated that her father is deceased. She indicated that her maternal grandmother is deceased. She indicated that her maternal grandfather is deceased. She indicated that her paternal grandmother is deceased. She indicated that her paternal grandfather is deceased. She  indicated that only one of her three maternal aunts is alive. She indicated that  her paternal aunt is deceased. She indicated that both of her paternal uncles are deceased. She indicated that one of her two others is deceased.  Social History    Social History   Socioeconomic History   Marital status: Married    Spouse name: Not on file   Number of children: Not on file   Years of education: Not on file   Highest education level: Not on file  Occupational History   Not on file  Tobacco Use   Smoking status: Never   Smokeless tobacco: Never  Vaping Use   Vaping Use: Never used  Substance and Sexual Activity   Alcohol use: Yes    Alcohol/week: 1.0 standard drink of alcohol    Types: 1 Glasses of wine per week    Comment: occ   Drug use: No   Sexual activity: Not on file  Other Topics Concern   Not on file  Social History Narrative   ** Merged History Encounter **       Social Determinants of Health   Financial Resource Strain: Not on file  Food Insecurity: Not on file  Transportation Needs: Not on file  Physical Activity: Not on file  Stress: Not on file  Social Connections: Not on file  Intimate Partner Violence: Not on file     Review of Systems    General:  No chills, fever, night sweats or weight changes.  Cardiovascular:  No chest pain, dyspnea on exertion, edema, orthopnea, palpitations, paroxysmal nocturnal dyspnea. Dermatological: No rash, lesions/masses Respiratory: No cough, dyspnea Urologic: No hematuria, dysuria Abdominal:   No nausea, vomiting, diarrhea, bright red blood per rectum, melena, or hematemesis Neurologic:  No visual changes, wkns, changes in mental status. All other systems reviewed and are otherwise negative except as noted above.  Physical Exam    VS:  There were no vitals taken for this visit. , BMI There is no height or weight on file to calculate BMI. GEN: Well nourished, well developed, in no acute distress. HEENT: normal. Neck:  Supple, no JVD, carotid bruits, or masses. Cardiac: RRR, no murmurs, rubs, or gallops. No clubbing, cyanosis, edema.  Radials/DP/PT 2+ and equal bilaterally.  Respiratory:  Respirations regular and unlabored, clear to auscultation bilaterally. GI: Soft, nontender, nondistended, BS + x 4. MS: no deformity or atrophy. Skin: warm and dry, no rash. Neuro:  Strength and sensation are intact. Psych: Normal affect.  Accessory Clinical Findings    Recent Labs: 12/29/2020: Magnesium 2.3; TSH 2.364 03/03/2021: ALT 33; Hemoglobin 14.1; Platelets 203 05/04/2021: BUN 10; Creatinine, Ser 0.70; Potassium 4.6; Sodium 136   Recent Lipid Panel    Component Value Date/Time   CHOL 167 04/28/2021 0946   TRIG 92 04/28/2021 0946   HDL 95 04/28/2021 0946   CHOLHDL 1.8 04/28/2021 0946   LDLCALC 56 04/28/2021 0946    ECG personally reviewed by me today- *** - No acute changes  Echocardiogram 12/29/2020  IMPRESSIONS     1. Left ventricular ejection fraction, by estimation, is 55%. The left  ventricle has normal function. The left ventricle has no regional wall  motion abnormalities. Left ventricular diastolic parameters are  indeterminate.   2. Right ventricular systolic function is mildly reduced. The right  ventricular size is mildly enlarged. There is moderately elevated  pulmonary artery systolic pressure. The estimated right ventricular  systolic pressure is 48.1 mmHg.   3. Left atrial size was moderately dilated.   4. Right atrial size was mildly dilated.   5.  The mitral valve is normal in structure. Trivial mitral valve  regurgitation. No evidence of mitral stenosis.   6. The aortic valve is tricuspid. Aortic valve regurgitation is mild. No  aortic stenosis is present.   7. The inferior vena cava is normal in size with greater than 50%  respiratory variability, suggesting right atrial pressure of 3 mmHg.   8. The patient was in atrial fibrillation.  Assessment & Plan   1.  Atrial  fibrillation-EKG today shows***.  Cardiac unaware.  Reports compliance with Xarelto and denies bleeding issues. Continue Xarelto, propranolol Heart healthy low-sodium diet Increase physical activity as tolerated Avoid triggers caffeine, chocolate, EtOH, dehydration etc.  Diastolic CHF-no increased DOE or activity intolerance.  Weight stable.  Generalized bilateral lower extremity edema. Continue losartan, propranolol, furosemide, potassium Heart healthy low-sodium diet-salty 6 given Increase physical activity as tolerated  Essential hypertension-BP today***.  Well-controlled at home. Continue irbesartan, propranolol, amlodipine Heart healthy low-sodium diet-salty 6 given Increase physical activity as tolerated Order BMP  Bilateral lower extremity edema-stable.  Reports intermittent use/as needed use of furosemide. Heart healthy low-sodium diet Elevate lower extremities when not active Lower extremity support stockings recommended  Disposition: Follow-up with Dr. [name] in 6 months.  Jossie Ng. Wayne Brunker NP-C    11/16/2021, 6:27 AM [place] [address] Office [phone] Fax [phone]  Notice: This dictation was prepared with Dragon dictation along with smaller phrase technology. Any transcriptional errors that result from this process are unintentional and may not be corrected upon review.  I spent***minutes examining this patient, reviewing medications, and using patient centered shared decision making involving her cardiac care.  Prior to her visit I spent greater than 20 minutes reviewing her past medical history,  medications, and prior cardiac tests.

## 2021-11-18 ENCOUNTER — Ambulatory Visit (INDEPENDENT_AMBULATORY_CARE_PROVIDER_SITE_OTHER): Payer: Medicare Other | Admitting: General Practice

## 2021-11-18 ENCOUNTER — Ambulatory Visit: Payer: Medicare Other | Admitting: General Practice

## 2021-11-18 ENCOUNTER — Encounter: Payer: Self-pay | Admitting: General Practice

## 2021-11-18 VITALS — BP 132/76 | HR 73 | Ht 60.0 in | Wt 140.6 lb

## 2021-11-18 DIAGNOSIS — I5032 Chronic diastolic (congestive) heart failure: Secondary | ICD-10-CM

## 2021-11-18 DIAGNOSIS — I4819 Other persistent atrial fibrillation: Secondary | ICD-10-CM

## 2021-11-18 DIAGNOSIS — R600 Localized edema: Secondary | ICD-10-CM | POA: Diagnosis not present

## 2021-11-18 DIAGNOSIS — I10 Essential (primary) hypertension: Secondary | ICD-10-CM

## 2021-11-18 MED ORDER — FUROSEMIDE 20 MG PO TABS: 20.0000 mg | ORAL_TABLET | Freq: Every day | ORAL | 6 refills | Status: DC | PRN

## 2021-11-18 NOTE — Patient Instructions (Signed)
Medication Instructions:  The current medical regimen is effective;  continue present plan and medications as directed. Please refer to the Current Medication list given to you today.   *If you need a refill on your cardiac medications before your next appointment, please call your pharmacy*  Lab Work:   Testing/Procedures:  BMET TODAY   NONE  If you have labs (blood work) drawn today and your tests are completely normal, you will receive your results only by: [place] (if you have MyChart) OR  A paper copy in the mail If you have any lab test that is abnormal or we need to change your treatment, we will call you to review the results.  Special Instructions PLEASE READ AND FOLLOW SALTY 6-ATTACHED-1,'800mg'$  daily  PLEASE INCREASE PHYSICAL ACTIVITY AS TOLERATED   ELEVATE LOWER EXTREMITIES WHEN AT REST  Follow-Up: Your next appointment:  6 month(s) In Person with Kirk Ruths, MD    Please call our office 2 months in advance to schedule this appointment  :1  At [facility], you and your health needs are our priority.  As part of our continuing mission to provide you with exceptional heart care, we have created designated Provider Care Teams.  These Care Teams include your primary Cardiologist (physician) and Advanced Practice Providers (APPs -  Physician Assistants and Nurse Practitioners) who all work together to provide you with the care you need, when you need it.   Important Information About Sugar               6 SALTY THINGS TO AVOID     1,'800MG'$  DAILY

## 2021-11-18 NOTE — Progress Notes (Signed)
Cardiology Clinic Note   Patient Name: [name] Date of Encounter: 11/18/2021  Primary Care Provider:  [facility] [place] Primary Cardiologist:  Kirk Ruths, MD  Patient Profile    MICAH Miller 81 year old female presents to the clinic today for follow-up evaluation of her persistent atrial fibrillation and CHF.  Past Medical History    Past Medical History:  Diagnosis Date   A-fib ([place])    Anemia    history of   Anxiety    Arthritis    Asthma    Asthma    Breast cancer ([place])    Cancer ([place])    skin cancers, one melanoma    Complication of anesthesia    patient woke up during colonoscopy   Concussion    8 yrs. 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    Keratosis, actinic    Melanoma ([place])    Persistent atrial fibrillation ([place]) 12/16/2016   CHA2DS2VASC score 3   Pneumonia    hx of    Past Surgical History:  Procedure Laterality Date   brachial skin removal due to deforimity Bilateral    BREAST ENHANCEMENT SURGERY Left 11/14/2018   Procedure: LEFT BREAST AUGMENTATION;  Surgeon: [name], MD;  Location: [facility];  Service: Plastics;  Laterality: Left;   BREAST RECONSTRUCTION Right 06/26/2018   BREAST RECONSTRUCTION WITH PLACEMENT OF TISSUE EXPANDER AND ALLODERM Right 06/26/2018   Procedure: RIGHT BREAST RECONSTRUCTION WITH PLACEMENT OF TISSUE EXPANDER;  Surgeon: [name], MD;  Location: [facility];  Service: Plastics;  Laterality: Right;   BREAST RECONSTRUCTION WITH PLACEMENT OF TISSUE EXPANDER AND FLEX HD (ACELLULAR HYDRATED DERMIS)  Right 01/28/2017    RIGHT BREAST RECONSTRUCTION WITH PLACEMENT OF TISSUE EXPANDER AND ALLODERM;  Surgeon: [name], MD;  Location: [facility];  Service: Plastics;  Laterality: Right;   BUNIONECTOMY     left    CATARACT EXTRACTION     [place] cataract surgery    COLONOSCOPY W/ POLYPECTOMY     JOINT REPLACEMENT     KNEE ARTHROSCOPY     left    LATISSIMUS FLAP TO BREAST Right 06/26/2018   Procedure: LATISSIMUS FLAP TO RIGHT CHEST;  Surgeon: [name], MD;  Location: [employer];  Service: Plastics;  Laterality: Right;   MASTECTOMY Right    MASTECTOMY W/ SENTINEL NODE BIOPSY Right 01/28/2017   Procedure: RIGHT TOTAL MASTECTOMY WITH SENTINEL LYMPH NODE BIOPSY;  Surgeon: Excell Seltzer, MD;  Location: [place];  Service: General;  Laterality: Right;   MASTOPEXY Left 11/14/2018   Procedure: LEFT BREAST MASTOPEXY;  Surgeon: [name], MD;  Location: [facility];  Service: Plastics;  Laterality: Left;   MELANOMA EXCISION Left    polyp removed from uterus      REMOVAL OF TISSUE EXPANDER AND PLACEMENT OF IMPLANT Right 11/14/2018   Procedure: REMOVAL OF RIGHT TISSUE EXPANDER AND PLACEMENT OF SALINE IMPLANT;  Surgeon: [name], MD;  Location: [facility];  Service: Plastics;  Laterality: Right;   right wrist pinning  skin cancers removed     TISSUE EXPANDER PLACEMENT Right 02/27/2018   Procedure: REMOVAL OF TISSUE EXPANDER;  Surgeon: [name], MD;  Location: [facility];  Service: Plastics;  Laterality: Right;   TOTAL KNEE ARTHROPLASTY Right 01/29/2013   Procedure: RIGHT TOTAL KNEE ARTHROPLASTY;  Surgeon: Gearlean Alf, MD;  Location: [facility];  Service: Orthopedics;  Laterality: Right;   TOTAL KNEE ARTHROPLASTY Left 08/29/2017   Procedure: LEFT TOTAL KNEE ARTHROPLASTY;  Surgeon: Gaynelle Arabian, MD;  Location: [facility];  Service: Orthopedics;  Laterality: Left;    Allergies  Allergies  Allergen Reactions   Chlorhexidine Gluconate Hives, Swelling  and Rash       "ChloraPrep "   Doxycycline Nausea And Vomiting   Codeine Nausea Only and Other (See Comments)    Pt can tolerate when pre-medicated   Doxycycline Nausea And Vomiting   Nsaids     avoid due to gerd   Nsaids Other (See Comments)    Gi bleed   Tylenol [Acetaminophen]     Avoid due to GERD   Adhesive [Tape] Rash    Dermabond   Chlorhexidine Swelling and Rash   Codeine Nausea Only   Tylenol [Acetaminophen] Other (See Comments)    GI bleed    History of Present Illness    [name] has a PMH of CHF, atrial fibrillation, hypotension, asthma, GERD, right breast CA, syncope, generalized anxiety disorder, and syncope.  Her atrial fibrillation is managed with rate control and anticoagulation.  She developed worsening lower extremity edema and dyspnea while on a trip in [place].  Her Maxide was discontinued and she was treated with Lasix.  Her proBNP was 433.  Her echocardiogram 7/22 showed LVEF of 55%, moderate pulmonary hypertension, mild RV dysfunction, mild right ventricular enlargement, moderate left atrial enlargement, mild right atrial enlargement, mild aortic insufficiency.  Her carotid Doppler 7/22 showed 1-39% bilateral stenosis.  She had a episode of syncope 7/22.  At that time her blood pressure was low.  She was in her car and stood and walked to her porch.  She became lightheaded.  Her losartan was discontinued.  She reported that she may have been dehydrated.  On follow-up 9/22 she reported spikes in her blood pressure.  Amlodipine was initiated.  She was seen in follow-up on 04/10/2021 by Dr. [name].  During that time she noted worsening lower extremity swelling.  She did note occasional dyspnea on exertion.  She denied orthopnea PND, exertional chest pain and syncope.  She did note 1 episode of chest tightness while laying down.  Her irbesartan was increased to 150 mg at that time.  She presents to the clinic today for follow-up evaluation states she feels better  now that she has her right arm cast and left lower extremity boot off.  She fell on a brick walkway several weeks ago.  We reviewed her medications and she reports that she is not needed regular furosemide.  I will refill her furosemide and her Xarelto.  She is slowly increasing her physical activity and has just generalized lower extremity edema.  I will repeat her BMP today and plan follow-up for 6 months.  Today she denies chest pain, shortness of breath, lower extremity edema, fatigue, palpitations, melena, hematuria, hemoptysis, diaphoresis, weakness, presyncope, syncope, orthopnea, and PND.  Home Medications    Prior to Admission medications   Medication Sig Start Date End Date Taking? Authorizing Provider  albuterol (VENTOLIN HFA) 108 (90 Base) MCG/ACT inhaler Inhale 1 puff into  the lungs every 6 (six) hours as needed for wheezing or shortness of breath.    [provider]  ALPRAZolam Duanne Moron) 0.5 MG tablet Take 0.5 mg by mouth daily as needed for anxiety.     [provider]  B Complex Vitamins (VITAMIN B COMPLEX PO) Take 1 capsule by mouth daily.    [provider]  BREO ELLIPTA 100-25 MCG/INH AEPB Inhale 1 puff into the lungs daily. 12/22/16   [provider]  COLLAGEN PO Take 1 capsule by mouth daily.    [provider]  desonide (DESOWEN) 0.05 % cream Apply 1 application topically 2 (two) times daily as needed (for skin irritation.).    [provider]  diclofenac sodium (VOLTAREN) 1 % GEL Apply 2 g topically daily as needed (pain).     [provider]  EPINEPHrine 0.3 mg/0.3 mL IJ SOAJ injection Inject 0.3 mg into the muscle as needed for anaphylaxis.    [provider]  famotidine (PEPCID) 20 MG tablet Take 1 tablet by mouth as directed. 06/29/19   [provider]  fluorouracil (EFUDEX) 5 % cream Apply 1 application topically as directed. Per MD instructions, for skin cancer 12/03/20   [provider]   fluticasone (FLONASE) 50 MCG/ACT nasal spray Place 2 sprays into both nostrils daily.    [provider]  furosemide (LASIX) 20 MG tablet Take 20 mg by mouth daily as needed.    [provider]  irbesartan (AVAPRO) 150 MG tablet Take 1 tablet (150 mg total) by mouth daily. 04/10/21   Lelon Perla, MD  letrozole [facility]) 2.5 MG tablet TAKE 1 TABLET BY MOUTH EVERY DAY 05/29/21   Magrinat, Virgie Dad, MD  levocetirizine (XYZAL) 5 MG tablet Take 5 mg by mouth at bedtime. 09/04/20   [provider]  Methylsulfonylmethane (MSM PO) Take 1 capsule by mouth daily.    [provider]  MILK THISTLE PO Take 1 capsule by mouth daily.    [provider]  Misc Natural Products (PYCNOGENOL COMPLEX PO) Take 1 capsule by mouth daily.    [provider]  Omega-3 Fatty Acids (OMEGA 3 PO) Take 1 capsule by mouth daily.    [provider]  OVER THE COUNTER MEDICATION Take 1 capsule by mouth daily. Immunity supplement    [provider]  oxyCODONE (ROXICODONE) 5 MG immediate release tablet Take 1 tablet (5 mg total) by mouth every 4 (four) hours as needed for severe pain. 09/02/21   Gareth Morgan, MD  Polyethyl Glycol-Propyl Glycol (SYSTANE OP) Place 1 drop into both eyes daily.    [provider]  potassium chloride (KLOR-CON) 20 MEQ packet Take 20 mEq by mouth daily as needed.    [provider]  propranolol ER (INDERAL LA) 80 MG 24 hr capsule Take 80 mg by mouth daily.    [provider]  TURMERIC PO Take 1 capsule by mouth daily.    [provider]  venlafaxine XR (EFFEXOR-XR) 75 MG 24 hr capsule Take 1 capsule (75 mg total) by mouth daily with breakfast. 09/29/21   Benay Pike, MD  VITAMIN D PO Take 1 capsule by mouth daily.    [provider]  XARELTO 20 MG TABS tablet TAKE 1 TABLET BY MOUTH DAILY WITH SUPPER 09/29/21   Vickie Epley, MD    Family History    Family History  Problem  Relation Age of Onset   Hypertension Other    COPD Father  Breast cancer Other        dx in her 20s-30s   She indicated that her mother is deceased. She indicated that her father is deceased. She indicated that her maternal grandmother is deceased. She indicated that her maternal grandfather is deceased. She indicated that her paternal grandmother is deceased. She indicated that her paternal grandfather is deceased. She indicated that only one of her three maternal aunts is alive. She indicated that her paternal aunt is deceased. She indicated that both of her paternal uncles are deceased. She indicated that one of her two others is deceased.  Social History    Social History   Socioeconomic History   Marital status: Married    Spouse name: Not on file   Number of children: Not on file   Years of education: Not on file   Highest education level: Not on file  Occupational History   Not on file  Tobacco Use   Smoking status: Never   Smokeless tobacco: Never  Vaping Use   Vaping Use: Never used  Substance and Sexual Activity   Alcohol use: Yes    Alcohol/week: 1.0 standard drink of alcohol    Types: 1 Glasses of wine per week    Comment: occ   Drug use: No   Sexual activity: Not on file  Other Topics Concern   Not on file  Social History Narrative   ** Merged History Encounter **       Social Determinants of Health   Financial Resource Strain: Not on file  Food Insecurity: Not on file  Transportation Needs: Not on file  Physical Activity: Not on file  Stress: Not on file  Social Connections: Not on file  Intimate Partner Violence: Not on file     Review of Systems    General:  No chills, fever, night sweats or weight changes.  Cardiovascular:  No chest pain, dyspnea on exertion, edema, orthopnea, palpitations, paroxysmal nocturnal dyspnea. Dermatological: No rash, lesions/masses Respiratory: No cough, dyspnea Urologic: No hematuria, dysuria Abdominal:   No  nausea, vomiting, diarrhea, bright red blood per rectum, melena, or hematemesis Neurologic:  No visual changes, wkns, changes in mental status. All other systems reviewed and are otherwise negative except as noted above.  Physical Exam    VS:  BP 132/76   Pulse 73   Ht 5' (1.524 m)   Wt 140 lb 9.6 oz (63.8 kg)   SpO2 97%   BMI 27.46 kg/m  , BMI Body mass index is 27.46 kg/m. GEN: Well nourished, well developed, in no acute distress. HEENT: normal. Neck: Supple, no JVD, carotid bruits, or masses. Cardiac: RRR, no murmurs, rubs, or gallops. No clubbing, cyanosis, generalized bilateral lower extremity nonpitting edema.  Radials/DP/PT 2+ and equal bilaterally.  Respiratory:  Respirations regular and unlabored, clear to auscultation bilaterally. GI: Soft, nontender, nondistended, BS + x 4. MS: no deformity or atrophy. Skin: warm and dry, no rash. Neuro:  Strength and sensation are intact. Psych: Normal affect.  Accessory Clinical Findings    Recent Labs: 12/29/2020: Magnesium 2.3; TSH 2.364 03/03/2021: ALT 33; Hemoglobin 14.1; Platelets 203 05/04/2021: BUN 10; Creatinine, Ser 0.70; Potassium 4.6; Sodium 136   Recent Lipid Panel    Component Value Date/Time   CHOL 167 04/28/2021 0946   TRIG 92 04/28/2021 0946   HDL 95 04/28/2021 0946   CHOLHDL 1.8 04/28/2021 0946   LDLCALC 56 04/28/2021 0946    ECG personally reviewed by me today-atrial fibrillation 73 bpm- No acute  changes  Echocardiogram 12/29/2020  IMPRESSIONS     1. Left ventricular ejection fraction, by estimation, is 55%. The left  ventricle has normal function. The left ventricle has no regional wall  motion abnormalities. Left ventricular diastolic parameters are  indeterminate.   2. Right ventricular systolic function is mildly reduced. The right  ventricular size is mildly enlarged. There is moderately elevated  pulmonary artery systolic pressure. The estimated right ventricular  systolic pressure is 48.5  mmHg.   3. Left atrial size was moderately dilated.   4. Right atrial size was mildly dilated.   5. The mitral valve is normal in structure. Trivial mitral valve  regurgitation. No evidence of mitral stenosis.   6. The aortic valve is tricuspid. Aortic valve regurgitation is mild. No  aortic stenosis is present.   7. The inferior vena cava is normal in size with greater than 50%  respiratory variability, suggesting right atrial pressure of 3 mmHg.   8. The patient was in atrial fibrillation.  Assessment & Plan   1.   Atrial fibrillation-EKG today shows atrial fibrillation.  Cardiac unaware.  Reports compliance with Xarelto and denies bleeding issues. Continue Xarelto, propranolol Heart healthy low-sodium diet Increase physical activity as tolerated Avoid triggers caffeine, chocolate, EtOH, dehydration etc.  Diastolic CHF-no increased DOE or activity intolerance.  Weight stable.  Generalized bilateral lower extremity edema. Continue losartan, propranolol, furosemide, potassium Heart healthy low-sodium diet-salty 6 given Increase physical activity as tolerated  Essential hypertension-BP today 132/76.  Well-controlled at home. Continue irbesartan, propranolol, amlodipine Heart healthy low-sodium diet-salty 6 given Increase physical activity as tolerated Order BMP  Bilateral lower extremity edema-stable.  Reports intermittent use/as needed use of furosemide. Heart healthy low-sodium diet Elevate lower extremities when not active Lower extremity support stockings recommended  Disposition: Follow-up with Dr. [name] in 6 months.   Jossie Ng. Itzelle Gains NP-C    11/18/2021, 2:23 PM [facility] [place] [address] Office [phone] Fax [phone]  Notice: This dictation was prepared with Dragon dictation along with smaller phrase technology. Any transcriptional errors that result from this process are unintentional and may not be corrected upon  review.  I spent 14 minutes examining this patient, reviewing medications, and using patient centered shared decision making involving her cardiac care.  Prior to her visit I spent greater than 20 minutes reviewing her past medical history,  medications, and prior cardiac tests.

## 2021-11-19 LAB — BASIC METABOLIC PANEL
BUN/Creatinine Ratio: 15 (ref 12–28)
BUN: 10 mg/dL (ref 8–27)
CO2: 25 mmol/L (ref 20–29)
Calcium: 10 mg/dL (ref 8.7–10.3)
Chloride: 94 mmol/L — ABNORMAL LOW (ref 96–106)
Creatinine, Ser: 0.65 mg/dL (ref 0.57–1.00)
Glucose: 101 mg/dL — ABNORMAL HIGH (ref 70–99)
Potassium: 4.8 mmol/L (ref 3.5–5.2)
Sodium: 139 mmol/L (ref 134–144)
eGFR: 90 mL/min/{1.73_m2} (ref >59)

## 2021-12-30 ENCOUNTER — Other Ambulatory Visit: Payer: Self-pay | Admitting: Hematology and Oncology

## 2022-01-01 ENCOUNTER — Emergency Department (HOSPITAL_COMMUNITY): Payer: Medicare Other

## 2022-01-01 ENCOUNTER — Inpatient Hospital Stay (HOSPITAL_COMMUNITY)
Admission: EM | Admit: 2022-01-01 | Discharge: 2022-03-07 | DRG: 853 | Disposition: E | Payer: Medicare Other | Attending: Internal Medicine | Admitting: Internal Medicine

## 2022-01-01 DIAGNOSIS — K565 Intestinal adhesions [bands], unspecified as to partial versus complete obstruction: Secondary | ICD-10-CM | POA: Diagnosis not present

## 2022-01-01 DIAGNOSIS — Z7951 Long term (current) use of inhaled steroids: Secondary | ICD-10-CM

## 2022-01-01 DIAGNOSIS — R6521 Severe sepsis with septic shock: Secondary | ICD-10-CM | POA: Diagnosis not present

## 2022-01-01 DIAGNOSIS — D62 Acute posthemorrhagic anemia: Secondary | ICD-10-CM | POA: Diagnosis not present

## 2022-01-01 DIAGNOSIS — I468 Cardiac arrest due to other underlying condition: Secondary | ICD-10-CM | POA: Diagnosis not present

## 2022-01-01 DIAGNOSIS — A419 Sepsis, unspecified organism: Principal | ICD-10-CM | POA: Diagnosis present

## 2022-01-01 DIAGNOSIS — Z923 Personal history of irradiation: Secondary | ICD-10-CM

## 2022-01-01 DIAGNOSIS — I469 Cardiac arrest, cause unspecified: Secondary | ICD-10-CM | POA: Diagnosis not present

## 2022-01-01 DIAGNOSIS — O85 Puerperal sepsis: Secondary | ICD-10-CM | POA: Diagnosis not present

## 2022-01-01 DIAGNOSIS — E875 Hyperkalemia: Secondary | ICD-10-CM | POA: Diagnosis present

## 2022-01-01 DIAGNOSIS — K631 Perforation of intestine (nontraumatic): Secondary | ICD-10-CM | POA: Diagnosis not present

## 2022-01-01 DIAGNOSIS — E1165 Type 2 diabetes mellitus with hyperglycemia: Secondary | ICD-10-CM | POA: Diagnosis present

## 2022-01-01 DIAGNOSIS — Z825 Family history of asthma and other chronic lower respiratory diseases: Secondary | ICD-10-CM

## 2022-01-01 DIAGNOSIS — K9189 Other postprocedural complications and disorders of digestive system: Secondary | ICD-10-CM | POA: Diagnosis not present

## 2022-01-01 DIAGNOSIS — F32A Depression, unspecified: Secondary | ICD-10-CM | POA: Diagnosis present

## 2022-01-01 DIAGNOSIS — E43 Unspecified severe protein-calorie malnutrition: Secondary | ICD-10-CM | POA: Diagnosis present

## 2022-01-01 DIAGNOSIS — E871 Hypo-osmolality and hyponatremia: Secondary | ICD-10-CM | POA: Diagnosis present

## 2022-01-01 DIAGNOSIS — L8990 Pressure ulcer of unspecified site, unspecified stage: Secondary | ICD-10-CM | POA: Insufficient documentation

## 2022-01-01 DIAGNOSIS — I119 Hypertensive heart disease without heart failure: Secondary | ICD-10-CM | POA: Diagnosis not present

## 2022-01-01 DIAGNOSIS — J9621 Acute and chronic respiratory failure with hypoxia: Secondary | ICD-10-CM | POA: Diagnosis not present

## 2022-01-01 DIAGNOSIS — Y733 Surgical instruments, materials and gastroenterology and urology devices (including sutures) associated with adverse incidents: Secondary | ICD-10-CM | POA: Diagnosis not present

## 2022-01-01 DIAGNOSIS — Z66 Do not resuscitate: Secondary | ICD-10-CM | POA: Diagnosis not present

## 2022-01-01 DIAGNOSIS — D849 Immunodeficiency, unspecified: Secondary | ICD-10-CM | POA: Diagnosis present

## 2022-01-01 DIAGNOSIS — I4811 Longstanding persistent atrial fibrillation: Secondary | ICD-10-CM | POA: Diagnosis not present

## 2022-01-01 DIAGNOSIS — G9341 Metabolic encephalopathy: Secondary | ICD-10-CM | POA: Diagnosis not present

## 2022-01-01 DIAGNOSIS — E8809 Other disorders of plasma-protein metabolism, not elsewhere classified: Secondary | ICD-10-CM | POA: Diagnosis not present

## 2022-01-01 DIAGNOSIS — F05 Delirium due to known physiological condition: Secondary | ICD-10-CM | POA: Diagnosis not present

## 2022-01-01 DIAGNOSIS — K559 Vascular disorder of intestine, unspecified: Secondary | ICD-10-CM | POA: Diagnosis not present

## 2022-01-01 DIAGNOSIS — I4891 Unspecified atrial fibrillation: Secondary | ICD-10-CM | POA: Diagnosis not present

## 2022-01-01 DIAGNOSIS — Z885 Allergy status to narcotic agent status: Secondary | ICD-10-CM

## 2022-01-01 DIAGNOSIS — T380X5A Adverse effect of glucocorticoids and synthetic analogues, initial encounter: Secondary | ICD-10-CM | POA: Diagnosis not present

## 2022-01-01 DIAGNOSIS — I5043 Acute on chronic combined systolic (congestive) and diastolic (congestive) heart failure: Secondary | ICD-10-CM | POA: Diagnosis present

## 2022-01-01 DIAGNOSIS — E872 Acidosis, unspecified: Secondary | ICD-10-CM | POA: Diagnosis not present

## 2022-01-01 DIAGNOSIS — Z853 Personal history of malignant neoplasm of breast: Secondary | ICD-10-CM

## 2022-01-01 DIAGNOSIS — E669 Obesity, unspecified: Secondary | ICD-10-CM | POA: Diagnosis present

## 2022-01-01 DIAGNOSIS — I480 Paroxysmal atrial fibrillation: Secondary | ICD-10-CM | POA: Diagnosis not present

## 2022-01-01 DIAGNOSIS — R188 Other ascites: Secondary | ICD-10-CM | POA: Diagnosis not present

## 2022-01-01 DIAGNOSIS — Z515 Encounter for palliative care: Secondary | ICD-10-CM

## 2022-01-01 DIAGNOSIS — I5181 Takotsubo syndrome: Secondary | ICD-10-CM | POA: Diagnosis not present

## 2022-01-01 DIAGNOSIS — I4821 Permanent atrial fibrillation: Secondary | ICD-10-CM | POA: Diagnosis present

## 2022-01-01 DIAGNOSIS — J441 Chronic obstructive pulmonary disease with (acute) exacerbation: Secondary | ICD-10-CM | POA: Diagnosis present

## 2022-01-01 DIAGNOSIS — I959 Hypotension, unspecified: Secondary | ICD-10-CM | POA: Diagnosis present

## 2022-01-01 DIAGNOSIS — Z79899 Other long term (current) drug therapy: Secondary | ICD-10-CM

## 2022-01-01 DIAGNOSIS — R0602 Shortness of breath: Secondary | ICD-10-CM

## 2022-01-01 DIAGNOSIS — L538 Other specified erythematous conditions: Secondary | ICD-10-CM | POA: Diagnosis not present

## 2022-01-01 DIAGNOSIS — I5021 Acute systolic (congestive) heart failure: Secondary | ICD-10-CM | POA: Diagnosis not present

## 2022-01-01 DIAGNOSIS — I509 Heart failure, unspecified: Secondary | ICD-10-CM | POA: Diagnosis not present

## 2022-01-01 DIAGNOSIS — Y832 Surgical operation with anastomosis, bypass or graft as the cause of abnormal reaction of the patient, or of later complication, without mention of misadventure at the time of the procedure: Secondary | ICD-10-CM | POA: Diagnosis not present

## 2022-01-01 DIAGNOSIS — F419 Anxiety disorder, unspecified: Secondary | ICD-10-CM | POA: Diagnosis present

## 2022-01-01 DIAGNOSIS — Z888 Allergy status to other drugs, medicaments and biological substances status: Secondary | ICD-10-CM

## 2022-01-01 DIAGNOSIS — F411 Generalized anxiety disorder: Secondary | ICD-10-CM | POA: Diagnosis present

## 2022-01-01 DIAGNOSIS — J918 Pleural effusion in other conditions classified elsewhere: Secondary | ICD-10-CM | POA: Diagnosis not present

## 2022-01-01 DIAGNOSIS — J690 Pneumonitis due to inhalation of food and vomit: Secondary | ICD-10-CM | POA: Diagnosis present

## 2022-01-01 DIAGNOSIS — Z6832 Body mass index (BMI) 32.0-32.9, adult: Secondary | ICD-10-CM

## 2022-01-01 DIAGNOSIS — I5040 Unspecified combined systolic (congestive) and diastolic (congestive) heart failure: Secondary | ICD-10-CM | POA: Diagnosis not present

## 2022-01-01 DIAGNOSIS — R4182 Altered mental status, unspecified: Secondary | ICD-10-CM | POA: Diagnosis not present

## 2022-01-01 DIAGNOSIS — J9601 Acute respiratory failure with hypoxia: Secondary | ICD-10-CM | POA: Diagnosis present

## 2022-01-01 DIAGNOSIS — I4819 Other persistent atrial fibrillation: Secondary | ICD-10-CM | POA: Diagnosis not present

## 2022-01-01 DIAGNOSIS — G9340 Encephalopathy, unspecified: Secondary | ICD-10-CM | POA: Diagnosis not present

## 2022-01-01 DIAGNOSIS — J90 Pleural effusion, not elsewhere classified: Secondary | ICD-10-CM | POA: Diagnosis not present

## 2022-01-01 DIAGNOSIS — I248 Other forms of acute ischemic heart disease: Secondary | ICD-10-CM | POA: Diagnosis not present

## 2022-01-01 DIAGNOSIS — J9811 Atelectasis: Secondary | ICD-10-CM | POA: Diagnosis not present

## 2022-01-01 DIAGNOSIS — M7989 Other specified soft tissue disorders: Secondary | ICD-10-CM | POA: Diagnosis not present

## 2022-01-01 DIAGNOSIS — Z96653 Presence of artificial knee joint, bilateral: Secondary | ICD-10-CM | POA: Diagnosis present

## 2022-01-01 DIAGNOSIS — K567 Ileus, unspecified: Secondary | ICD-10-CM | POA: Diagnosis not present

## 2022-01-01 DIAGNOSIS — Z7901 Long term (current) use of anticoagulants: Secondary | ICD-10-CM

## 2022-01-01 DIAGNOSIS — I5031 Acute diastolic (congestive) heart failure: Secondary | ICD-10-CM | POA: Diagnosis not present

## 2022-01-01 DIAGNOSIS — J9602 Acute respiratory failure with hypercapnia: Secondary | ICD-10-CM | POA: Diagnosis not present

## 2022-01-01 DIAGNOSIS — K219 Gastro-esophageal reflux disease without esophagitis: Secondary | ICD-10-CM | POA: Diagnosis present

## 2022-01-01 DIAGNOSIS — Z8249 Family history of ischemic heart disease and other diseases of the circulatory system: Secondary | ICD-10-CM

## 2022-01-01 DIAGNOSIS — I2729 Other secondary pulmonary hypertension: Secondary | ICD-10-CM | POA: Diagnosis present

## 2022-01-01 DIAGNOSIS — R7989 Other specified abnormal findings of blood chemistry: Secondary | ICD-10-CM | POA: Diagnosis not present

## 2022-01-01 DIAGNOSIS — E876 Hypokalemia: Secondary | ICD-10-CM | POA: Diagnosis present

## 2022-01-01 DIAGNOSIS — I081 Rheumatic disorders of both mitral and tricuspid valves: Secondary | ICD-10-CM | POA: Diagnosis present

## 2022-01-01 DIAGNOSIS — J45901 Unspecified asthma with (acute) exacerbation: Secondary | ICD-10-CM | POA: Diagnosis not present

## 2022-01-01 DIAGNOSIS — Z886 Allergy status to analgesic agent status: Secondary | ICD-10-CM

## 2022-01-01 DIAGNOSIS — R6520 Severe sepsis without septic shock: Secondary | ICD-10-CM | POA: Diagnosis not present

## 2022-01-01 DIAGNOSIS — Z85828 Personal history of other malignant neoplasm of skin: Secondary | ICD-10-CM

## 2022-01-01 DIAGNOSIS — Z7189 Other specified counseling: Secondary | ICD-10-CM | POA: Diagnosis not present

## 2022-01-01 DIAGNOSIS — Z79811 Long term (current) use of aromatase inhibitors: Secondary | ICD-10-CM

## 2022-01-01 DIAGNOSIS — T8110XA Postprocedural shock unspecified, initial encounter: Secondary | ICD-10-CM | POA: Diagnosis not present

## 2022-01-01 DIAGNOSIS — Z803 Family history of malignant neoplasm of breast: Secondary | ICD-10-CM

## 2022-01-01 DIAGNOSIS — I482 Chronic atrial fibrillation, unspecified: Secondary | ICD-10-CM | POA: Diagnosis not present

## 2022-01-01 DIAGNOSIS — Z881 Allergy status to other antibiotic agents status: Secondary | ICD-10-CM

## 2022-01-01 DIAGNOSIS — I2721 Secondary pulmonary arterial hypertension: Secondary | ICD-10-CM | POA: Diagnosis not present

## 2022-01-01 DIAGNOSIS — Z85820 Personal history of malignant melanoma of skin: Secondary | ICD-10-CM

## 2022-01-01 DIAGNOSIS — R1310 Dysphagia, unspecified: Secondary | ICD-10-CM | POA: Diagnosis not present

## 2022-01-01 DIAGNOSIS — K651 Peritoneal abscess: Secondary | ICD-10-CM | POA: Diagnosis not present

## 2022-01-01 DIAGNOSIS — I5033 Acute on chronic diastolic (congestive) heart failure: Secondary | ICD-10-CM | POA: Diagnosis not present

## 2022-01-01 DIAGNOSIS — J189 Pneumonia, unspecified organism: Secondary | ICD-10-CM

## 2022-01-01 DIAGNOSIS — I272 Pulmonary hypertension, unspecified: Secondary | ICD-10-CM | POA: Diagnosis not present

## 2022-01-01 DIAGNOSIS — Z9011 Acquired absence of right breast and nipple: Secondary | ICD-10-CM

## 2022-01-01 DIAGNOSIS — I10 Essential (primary) hypertension: Secondary | ICD-10-CM | POA: Diagnosis not present

## 2022-01-01 DIAGNOSIS — I110 Hypertensive heart disease with heart failure: Secondary | ICD-10-CM | POA: Diagnosis present

## 2022-01-01 DIAGNOSIS — R34 Anuria and oliguria: Secondary | ICD-10-CM | POA: Diagnosis not present

## 2022-01-01 LAB — I-STAT VENOUS BLOOD GAS, ED
Acid-Base Excess: 1 mmol/L (ref 0.0–2.0)
Bicarbonate: 26 mmol/L (ref 20.0–28.0)
Calcium, Ion: 1.07 mmol/L — ABNORMAL LOW (ref 1.15–1.40)
HCT: 48 % — ABNORMAL HIGH (ref 36.0–46.0)
Hemoglobin: 16.3 g/dL — ABNORMAL HIGH (ref 12.0–15.0)
O2 Saturation: 58 %
Potassium: 3.8 mmol/L (ref 3.5–5.1)
Sodium: 130 mmol/L — ABNORMAL LOW (ref 135–145)
TCO2: 27 mmol/L (ref 22–32)
pCO2, Ven: 41.9 mmHg — ABNORMAL LOW (ref 44–60)
pH, Ven: 7.401 (ref 7.25–7.43)
pO2, Ven: 30 mmHg — CL (ref 32–45)

## 2022-01-01 LAB — CBC WITH DIFFERENTIAL/PLATELET
Abs Immature Granulocytes: 0.08 10*3/uL — ABNORMAL HIGH (ref 0.00–0.07)
Abs Immature Granulocytes: 0.06 10*3/uL (ref 0.00–0.07)
Basophils Absolute: 0 10*3/uL (ref 0.0–0.1)
Basophils Absolute: 0 10*3/uL (ref 0.0–0.1)
Basophils Relative: 0 %
Basophils Relative: 0 %
Eosinophils Absolute: 0 10*3/uL (ref 0.0–0.5)
Eosinophils Absolute: 0 10*3/uL (ref 0.0–0.5)
Eosinophils Relative: 0 %
Eosinophils Relative: 0 %
HCT: 44.3 % (ref 36.0–46.0)
HCT: 41.9 % (ref 36.0–46.0)
Hemoglobin: 15.1 g/dL — ABNORMAL HIGH (ref 12.0–15.0)
Hemoglobin: 14.7 g/dL (ref 12.0–15.0)
Immature Granulocytes: 1 %
Immature Granulocytes: 1 %
Lymphocytes Relative: 4 %
Lymphocytes Relative: 4 %
Lymphs Abs: 0.4 10*3/uL — ABNORMAL LOW (ref 0.7–4.0)
Lymphs Abs: 0.4 10*3/uL — ABNORMAL LOW (ref 0.7–4.0)
MCH: 36.2 pg — ABNORMAL HIGH (ref 26.0–34.0)
MCH: 36.3 pg — ABNORMAL HIGH (ref 26.0–34.0)
MCHC: 34.1 g/dL (ref 30.0–36.0)
MCHC: 35.1 g/dL (ref 30.0–36.0)
MCV: 106.2 fL — ABNORMAL HIGH (ref 80.0–100.0)
MCV: 103.5 fL — ABNORMAL HIGH (ref 80.0–100.0)
Monocytes Absolute: 0.3 10*3/uL (ref 0.1–1.0)
Monocytes Absolute: 0.7 10*3/uL (ref 0.1–1.0)
Monocytes Relative: 3 %
Monocytes Relative: 7 %
Neutro Abs: 8.7 10*3/uL — ABNORMAL HIGH (ref 1.7–7.7)
Neutro Abs: 8.3 10*3/uL — ABNORMAL HIGH (ref 1.7–7.7)
Neutrophils Relative %: 92 %
Neutrophils Relative %: 88 %
Platelets: 190 10*3/uL (ref 150–400)
Platelets: 153 10*3/uL (ref 150–400)
RBC: 4.17 MIL/uL (ref 3.87–5.11)
RBC: 4.05 MIL/uL (ref 3.87–5.11)
RDW: 13.2 % (ref 11.5–15.5)
RDW: 13.2 % (ref 11.5–15.5)
WBC: 9.5 10*3/uL (ref 4.0–10.5)
WBC: 9.5 10*3/uL (ref 4.0–10.5)
nRBC: 0 % (ref 0.0–0.2)
nRBC: 0.2 % (ref 0.0–0.2)

## 2022-01-01 LAB — BRAIN NATRIURETIC PEPTIDE: B Natriuretic Peptide: 311.6 pg/mL — ABNORMAL HIGH (ref 0.0–100.0)

## 2022-01-01 LAB — COMPREHENSIVE METABOLIC PANEL
ALT: 21 U/L (ref 0–44)
AST: 50 U/L — ABNORMAL HIGH (ref 15–41)
Albumin: 3.7 g/dL (ref 3.5–5.0)
Alkaline Phosphatase: 131 U/L — ABNORMAL HIGH (ref 38–126)
Anion gap: 14 (ref 5–15)
BUN: 8 mg/dL (ref 8–23)
CO2: 23 mmol/L (ref 22–32)
Calcium: 9.4 mg/dL (ref 8.9–10.3)
Chloride: 95 mmol/L — ABNORMAL LOW (ref 98–111)
Creatinine, Ser: 0.63 mg/dL (ref 0.44–1.00)
GFR, Estimated: >60 mL/min (ref >60)
Glucose, Bld: 120 mg/dL — ABNORMAL HIGH (ref 70–99)
Potassium: 3.9 mmol/L (ref 3.5–5.1)
Sodium: 132 mmol/L — ABNORMAL LOW (ref 135–145)
Total Bilirubin: 1.4 mg/dL — ABNORMAL HIGH (ref 0.3–1.2)
Total Protein: 7.6 g/dL (ref 6.5–8.1)

## 2022-01-01 LAB — TROPONIN I (HIGH SENSITIVITY)
Troponin I (High Sensitivity): 11 ng/L (ref <18)
Troponin I (High Sensitivity): 15 ng/L (ref <18)

## 2022-01-01 LAB — MRSA NEXT GEN BY PCR, NASAL: MRSA by PCR Next Gen: NOT DETECTED

## 2022-01-01 MED ORDER — CEFTRIAXONE SODIUM 1 G IJ SOLR
1.0000 g | Freq: Once | INTRAMUSCULAR | Status: AC
  Administered 2022-01-01: 1 g via INTRAVENOUS
  Filled 2022-01-01: qty 10

## 2022-01-01 MED ORDER — PROCHLORPERAZINE EDISYLATE 10 MG/2ML IJ SOLN: 10.0000 mg | Freq: Four times a day (QID) | INTRAMUSCULAR | Status: DC | PRN

## 2022-01-01 MED ORDER — POLYETHYLENE GLYCOL 3350 17 G PO PACK: 17.0000 g | PACK | Freq: Every day | ORAL | Status: DC | PRN

## 2022-01-01 MED ORDER — GUAIFENESIN ER 600 MG PO TB12
600.0000 mg | ORAL_TABLET | Freq: Two times a day (BID) | ORAL | Status: DC
Start: 2022-01-01 — End: 2022-01-03
  Administered 2022-01-01 – 2022-01-03 (×4): 600 mg via ORAL
  Filled 2022-01-01 (×4): qty 1

## 2022-01-01 MED ORDER — FAMOTIDINE 20 MG PO TABS: 20.0000 mg | ORAL_TABLET | Freq: Two times a day (BID) | ORAL | Status: DC | PRN

## 2022-01-01 MED ORDER — VENLAFAXINE HCL ER 75 MG PO CP24
75.0000 mg | ORAL_CAPSULE | Freq: Every day | ORAL | Status: DC
Start: 2022-01-02 — End: 2022-01-03
  Administered 2022-01-02 – 2022-01-03 (×2): 75 mg via ORAL
  Filled 2022-01-01 (×3): qty 1

## 2022-01-01 MED ORDER — FLUTICASONE FUROATE-VILANTEROL 100-25 MCG/ACT IN AEPB
1.0000 | INHALATION_SPRAY | Freq: Every day | RESPIRATORY_TRACT | Status: DC
Start: 2022-01-02 — End: 2022-01-03
  Administered 2022-01-02 – 2022-01-03 (×2): 1 via RESPIRATORY_TRACT
  Filled 2022-01-01: qty 28

## 2022-01-01 MED ORDER — IRBESARTAN 150 MG PO TABS
150.0000 mg | ORAL_TABLET | Freq: Every day | ORAL | Status: DC
Start: 2022-01-02 — End: 2022-01-03
  Administered 2022-01-01 – 2022-01-03 (×3): 150 mg via ORAL
  Filled 2022-01-01 (×3): qty 1

## 2022-01-01 MED ORDER — IPRATROPIUM-ALBUTEROL 0.5-2.5 (3) MG/3ML IN SOLN
3.0000 mL | Freq: Four times a day (QID) | RESPIRATORY_TRACT | Status: DC
  Administered 2022-01-01 – 2022-01-03 (×8): 3 mL via RESPIRATORY_TRACT
  Filled 2022-01-01 (×8): qty 3

## 2022-01-01 MED ORDER — SODIUM CHLORIDE 3 % IN NEBU
4.0000 mL | INHALATION_SOLUTION | Freq: Two times a day (BID) | RESPIRATORY_TRACT | Status: AC
  Administered 2022-01-02 – 2022-01-04 (×5): 4 mL via RESPIRATORY_TRACT
  Filled 2022-01-01: qty 15
  Filled 2022-01-01 (×6): qty 4
  Filled 2022-01-01: qty 15

## 2022-01-01 MED ORDER — CEFTRIAXONE SODIUM 2 G IJ SOLR
2.0000 g | INTRAMUSCULAR | Status: DC
Start: 2022-01-02 — End: 2022-01-03
  Administered 2022-01-02: 2 g via INTRAVENOUS
  Filled 2022-01-01 (×2): qty 20

## 2022-01-01 MED ORDER — FUROSEMIDE 10 MG/ML IJ SOLN
20.0000 mg | Freq: Once | INTRAMUSCULAR | Status: DC
Start: 2022-01-01 — End: 2022-01-01

## 2022-01-01 MED ORDER — RIVAROXABAN 20 MG PO TABS
20.0000 mg | ORAL_TABLET | Freq: Every day | ORAL | Status: DC
  Administered 2022-01-02: 20 mg via ORAL
  Filled 2022-01-01 (×2): qty 2

## 2022-01-01 MED ORDER — GUAIFENESIN-DM 100-10 MG/5ML PO SYRP: 5.0000 mL | ORAL_SOLUTION | ORAL | Status: DC | PRN

## 2022-01-01 MED ORDER — ACETAMINOPHEN 325 MG PO TABS
650.0000 mg | ORAL_TABLET | Freq: Four times a day (QID) | ORAL | Status: DC | PRN
  Administered 2022-01-01 – 2022-01-02 (×2): 650 mg via ORAL
  Filled 2022-01-01 (×2): qty 2

## 2022-01-01 MED ORDER — PROPRANOLOL HCL ER 80 MG PO CP24
80.0000 mg | ORAL_CAPSULE | Freq: Every day | ORAL | Status: DC
  Administered 2022-01-02 – 2022-01-03 (×2): 80 mg via ORAL
  Filled 2022-01-01 (×2): qty 1

## 2022-01-01 MED ORDER — LETROZOLE 2.5 MG PO TABS
2.5000 mg | ORAL_TABLET | Freq: Every day | ORAL | Status: DC
  Administered 2022-01-02 – 2022-01-04 (×3): 2.5 mg via ORAL
  Filled 2022-01-01 (×3): qty 1

## 2022-01-01 MED ORDER — SODIUM CHLORIDE 0.9 % IV SOLN
500.0000 mg | INTRAVENOUS | Status: AC
  Administered 2022-01-02 – 2022-01-06 (×5): 500 mg via INTRAVENOUS
  Filled 2022-01-01 (×7): qty 5

## 2022-01-01 MED ORDER — METOPROLOL TARTRATE 5 MG/5ML IV SOLN
2.5000 mg | Freq: Four times a day (QID) | INTRAVENOUS | Status: DC | PRN
  Filled 2022-01-01: qty 5

## 2022-01-01 MED ORDER — MELATONIN 5 MG PO TABS
5.0000 mg | ORAL_TABLET | Freq: Every evening | ORAL | Status: DC | PRN
  Administered 2022-01-01 – 2022-01-02 (×2): 5 mg via ORAL
  Filled 2022-01-01 (×2): qty 1

## 2022-01-01 MED ORDER — SODIUM CHLORIDE 0.9 % IV SOLN
500.0000 mg | Freq: Once | INTRAVENOUS | Status: AC
  Administered 2022-01-01: 500 mg via INTRAVENOUS
  Filled 2022-01-01: qty 5

## 2022-01-01 NOTE — ED Triage Notes (Signed)
Pt BIB [facility] for Resp.Distress. Pt had 4th Covid shot today but was feeling bad beforehand. Pt was 85% on RA for fire.  They gave a neb.  Medic arrived to find pt on NRB.  On arrival PT is 97% on 4L [place] with wet sounding breath sounds per EMS.  Pt complains of Sharp right sided chest pain.  Pt is HTN.   185/100 HR 120 (afib at baseline). GCS 15

## 2022-01-01 NOTE — ED Provider Notes (Signed)
[place] EMERGENCY DEPARTMENT Provider Note   CSN: [id number] Arrival date & time: 12/29/2021  1747     History {Add pertinent medical, surgical, social history, OB history to HPI:1} No chief complaint on file.   [name] is a 81 y.o. female.  HPI     Home Medications Prior to Admission medications   Medication Sig Start Date End Date Taking? Authorizing Provider  albuterol (VENTOLIN HFA) 108 (90 Base) MCG/ACT inhaler Inhale 1 puff into the lungs every 6 (six) hours as needed for wheezing or shortness of breath.    [provider]  ALPRAZolam Duanne Moron) 0.5 MG tablet Take 0.5 mg by mouth daily as needed for anxiety.     [provider]  B Complex Vitamins (VITAMIN B COMPLEX PO) Take 1 capsule by mouth daily.    [provider]  BREO ELLIPTA 100-25 MCG/INH AEPB Inhale 1 puff into the lungs daily. 12/22/16   [provider]  COLLAGEN PO Take 1 capsule by mouth daily.    [provider]  desonide (DESOWEN) 0.05 % cream Apply 1 application topically 2 (two) times daily as needed (for skin irritation.).    [provider]  diclofenac sodium (VOLTAREN) 1 % GEL Apply 2 g topically daily as needed (pain).     [provider]  EPINEPHrine 0.3 mg/0.3 mL IJ SOAJ injection Inject 0.3 mg into the muscle as needed for anaphylaxis.    [provider]  famotidine (PEPCID) 20 MG tablet Take 1 tablet by mouth as directed. 06/29/19   [provider]  fluorouracil (EFUDEX) 5 % cream Apply 1 application topically as directed. Per MD instructions, for skin cancer 12/03/20   [provider]  fluticasone (FLONASE) 50 MCG/ACT nasal spray Place 2 sprays into both nostrils daily.    [provider]  furosemide (LASIX) 20 MG tablet Take 1 tablet (20 mg total) by mouth daily as needed. 11/18/21   Deberah Pelton, NP  irbesartan (AVAPRO) 150 MG tablet Take 1 tablet (150 mg total) by mouth daily.  04/10/21   Lelon Perla, MD  letrozole [facility]) 2.5 MG tablet TAKE 1 TABLET BY MOUTH EVERY DAY 05/29/21   Magrinat, Virgie Dad, MD  levocetirizine (XYZAL) 5 MG tablet Take 5 mg by mouth at bedtime. 09/04/20   [provider]  Methylsulfonylmethane (MSM PO) Take 1 capsule by mouth daily.    [provider]  MILK THISTLE PO Take 1 capsule by mouth daily.    [provider]  Misc Natural Products (PYCNOGENOL COMPLEX PO) Take 1 capsule by mouth daily.    [provider]  Omega-3 Fatty Acids (OMEGA 3 PO) Take 1 capsule by mouth daily.    [provider]  OVER THE COUNTER MEDICATION Take 1 capsule by mouth daily. Immunity supplement    [provider]  oxyCODONE (ROXICODONE) 5 MG immediate release tablet Take 1 tablet (5 mg total) by mouth every 4 (four) hours as needed for severe pain. 09/02/21   Gareth Morgan, MD  Polyethyl Glycol-Propyl Glycol (SYSTANE OP) Place 1 drop into both eyes daily.    [provider]  potassium chloride (KLOR-CON) 20 MEQ packet Take 20 mEq by mouth daily as needed.    [provider]  propranolol ER (INDERAL LA) 80 MG 24 hr capsule Take 80 mg by mouth daily.    [provider]  TURMERIC PO Take 1 capsule by mouth daily.    [provider]  venlafaxine XR (EFFEXOR-XR) 75 MG 24 hr capsule TAKE 1 CAPSULE BY MOUTH DAILY WITH BREAKFAST. 12/30/21   Benay Pike, MD  VITAMIN D PO Take 1 capsule by mouth daily.    [provider]  XARELTO 20 MG TABS tablet TAKE 1 TABLET BY MOUTH DAILY WITH SUPPER 09/29/21   Vickie Epley, MD      Allergies    Chlorhexidine gluconate, Doxycycline, Codeine, Doxycycline, Nsaids, Nsaids, Tylenol [acetaminophen], Adhesive [tape], Chlorhexidine, Codeine, and Tylenol [acetaminophen]    Review of Systems   Review of Systems  Physical Exam Updated Vital Signs There were no vitals taken for this visit. Physical Exam  ED Results / Procedures /  Treatments   Labs (all labs ordered are listed, but only abnormal results are displayed) Labs Reviewed - No data to display  EKG None  Radiology No results found.  Procedures Procedures  {Document cardiac monitor, telemetry assessment procedure when appropriate:1}  Medications Ordered in ED Medications - No data to display  ED Course/ Medical Decision Making/ A&P                           Medical Decision Making  ***  {Document critical care time when appropriate:1} {Document review of labs and clinical decision tools ie heart score, Chads2Vasc2 etc:1}  {Document your independent review of radiology images, and any outside records:1} {Document your discussion with family members, caretakers, and with consultants:1} {Document social determinants of health affecting pt's care:1} {Document your decision making why or why not admission, treatments were needed:1} Final Clinical Impression(s) / ED Diagnoses Final diagnoses:  None    Rx / DC Orders ED Discharge Orders     None

## 2022-01-01 NOTE — H&P (Signed)
History and Physical  [name] QQV:[id number] DOB: Oct 01, 1940 DOA: 12/21/2021  Referring physician: Dr. Oneita Hurt, [place] Resident.  PCP: [facility], [place] [facility] [place]  Outpatient Specialists: Cardiology, medical oncology. Patient coming from: Home  Chief Complaint: Shortness of breath, productive cough, and right-sided chest pain x3 days.  HPI: [name] is a 81 y.o. female with medical history significant for persistent atrial fibrillation on Xarelto, asthma, essential hypertension, chronic diastolic CHF, right breast cancer on letrozole, history of syncope, generalized anxiety disorder, moderate pulmonary hypertension, who presented to [facility] ED from home via EMS due to progressively worsening shortness of breath.  Associated with a productive cough and right-sided chest pain worse with inspiration of 3 days duration.  Her right-sided chest pain worsened today and she decided to come to the ED for further evaluation.    On EMS arrival the patient was noted to be hypoxic with O2 saturation of 85% on room air.  She received nebulizer treatment in route and was placed on nonrebreather.  Endorses subjective fevers.  While in the ED she is noted to be febrile with a Tmax of 103.  Right lower lobe infiltrate seen on chest x-ray, suggestive of right-sided pneumonia.  High-sensitivity troponin negative.  BNP greater than 300 but euvolemic on exam.  Placed on 4 L nasal cannula with O2 saturation in the mid 90s with uncontrolled hypertension, A-fib RVR.  Started on Rocephin and azithromycin empirically.  TRH, hospitalist service, was asked to admit.  ED Course: Tmax 103.  BP 146/92, pulse 112, respiration rate 28, O2 saturation 93% on 4 L.  Labs studies remarkable for serum sodium 130, alkaline phosphatase 131, AST 50, total bilirubin 1.4, BNP 311, high-sensitivity troponin 11.  CBC with hemoglobin of 16.3.  MCV 106.2.  Review of Systems: Review of systems as noted in the HPI. All other  systems reviewed and are negative.   Past Medical History:  Diagnosis Date   A-fib ([place])    Anemia    history of   Anxiety    Arthritis    Asthma    Asthma    Breast cancer ([place])    Cancer ([place])    skin cancers, one melanoma    Complication of anesthesia    patient woke up during colonoscopy   Concussion    8 yrs. 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    Keratosis, actinic    Melanoma ([place])    Persistent atrial fibrillation ([place]) 12/16/2016   CHA2DS2VASC score 3   Pneumonia    hx of    Past Surgical History:  Procedure Laterality Date   brachial skin removal due to deforimity Bilateral    BREAST ENHANCEMENT SURGERY Left 11/14/2018   Procedure: LEFT BREAST AUGMENTATION;  Surgeon: [name], MD;  Location: [place];  Service: Plastics;  Laterality: Left;   BREAST RECONSTRUCTION Right 06/26/2018   BREAST RECONSTRUCTION WITH PLACEMENT OF TISSUE EXPANDER AND ALLODERM Right 06/26/2018   Procedure: RIGHT BREAST RECONSTRUCTION WITH PLACEMENT OF TISSUE EXPANDER;  Surgeon: [name], MD;  Location: [place];  Service: Plastics;  Laterality: Right;   BREAST RECONSTRUCTION WITH PLACEMENT OF TISSUE EXPANDER AND FLEX HD (ACELLULAR HYDRATED DERMIS) Right 01/28/2017    RIGHT BREAST RECONSTRUCTION WITH PLACEMENT OF TISSUE EXPANDER AND ALLODERM;  Surgeon: [name], MD;  Location: [place];  Service: Plastics;  Laterality: Right;   BUNIONECTOMY     left    CATARACT EXTRACTION     bil[place]la cataract surgery    COLONOSCOPY W/ POLYPECTOMY     JOINT REPLACEMENT     KNEE ARTHROSCOPY     left     LATISSIMUS FLAP TO BREAST Right 06/26/2018   Procedure: LATISSIMUS FLAP TO RIGHT CHEST;  Surgeon: [name], MD;  Location: [place];  Service: Plastics;  Laterality: Right;   MASTECTOMY Right    MASTECTOMY W/ SENTINEL NODE BIOPSY Right 01/28/2017   Procedure: RIGHT TOTAL MASTECTOMY WITH SENTINEL LYMPH NODE BIOPSY;  Surgeon: Excell Seltzer, MD;  Location: [place] [place];  Service: General;  Laterality: Right;   MASTOPEXY Left 11/14/2018   Procedure: LEFT BREAST MASTOPEXY;  Surgeon: [name], MD;  Location: [place];  Service: Plastics;  Laterality: Left;   MELANOMA EXCISION Left    polyp removed from uterus      REMOVAL OF TISSUE EXPANDER AND PLACEMENT OF IMPLANT Right 11/14/2018   Procedure: REMOVAL OF RIGHT TISSUE EXPANDER AND PLACEMENT OF SALINE IMPLANT;  Surgeon: [name], MD;  Location: [place];  Service: Plastics;  Laterality: Right;   right wrist pinning      skin cancers removed     TISSUE EXPANDER PLACEMENT Right 02/27/2018   Procedure: REMOVAL OF TISSUE EXPANDER;  Surgeon: [name], MD;  Location: [place];  Service: Plastics;  Laterality: Right;   TOTAL KNEE ARTHROPLASTY Right 01/29/2013   Procedure: RIGHT TOTAL KNEE ARTHROPLASTY;  Surgeon: Gearlean Alf, MD;  Location: [facility];  Service: Orthopedics;  Laterality: Right;   TOTAL KNEE ARTHROPLASTY Left 08/29/2017   Procedure: LEFT TOTAL KNEE ARTHROPLASTY;  Surgeon: Gaynelle Arabian, MD;  Location: [facility];  Service: Orthopedics;  Laterality: Left;    Social History:  reports that she has never smoked. She has never used smokeless tobacco. She reports current alcohol use of about 1.0 standard drink of alcohol per week. She reports that she does not use drugs.   Allergies  Allergen Reactions   Chlorhexidine Gluconate Hives, Swelling and Rash       "ChloraPrep "   Doxycycline Nausea And Vomiting   Codeine Nausea Only and Other (See Comments)    Pt can tolerate when  pre-medicated   Doxycycline Nausea And Vomiting   Nsaids     avoid due to gerd   Nsaids Other (See Comments)    Gi bleed   Tylenol [Acetaminophen]     Avoid due to GERD   Adhesive [Tape] Rash    Dermabond   Chlorhexidine Swelling and Rash   Codeine Nausea Only   Tylenol [Acetaminophen] Other (See Comments)    GI bleed    Family History  Problem Relation Age of Onset   Hypertension Other    COPD Father    Breast cancer Other        dx in her 84s-30s      Prior to Admission medications   Medication Sig Start Date End Date Taking? Authorizing Provider  albuterol (VENTOLIN HFA) 108 (90 Base) MCG/ACT inhaler Inhale 1 puff into the lungs every 6 (six) hours as needed for wheezing or shortness of breath.  [provider]  ALPRAZolam Duanne Moron) 0.5 MG tablet Take 0.5 mg by mouth daily as needed for anxiety.     [provider]  B Complex Vitamins (VITAMIN B COMPLEX PO) Take 1 capsule by mouth daily.    [provider]  BREO ELLIPTA 100-25 MCG/INH AEPB Inhale 1 puff into the lungs daily. 12/22/16   [provider]  COLLAGEN PO Take 1 capsule by mouth daily.    [provider]  desonide (DESOWEN) 0.05 % cream Apply 1 application topically 2 (two) times daily as needed (for skin irritation.).    [provider]  diclofenac sodium (VOLTAREN) 1 % GEL Apply 2 g topically daily as needed (pain).     [provider]  EPINEPHrine 0.3 mg/0.3 mL IJ SOAJ injection Inject 0.3 mg into the muscle as needed for anaphylaxis.    [provider]  famotidine (PEPCID) 20 MG tablet Take 1 tablet by mouth as directed. 06/29/19   [provider]  fluorouracil (EFUDEX) 5 % cream Apply 1 application topically as directed. Per MD instructions, for skin cancer 12/03/20   [provider]  fluticasone (FLONASE) 50 MCG/ACT nasal spray Place 2 sprays into both nostrils daily.    [provider]  furosemide (LASIX) 20 MG  tablet Take 1 tablet (20 mg total) by mouth daily as needed. 11/18/21   Deberah Pelton, NP  irbesartan (AVAPRO) 150 MG tablet Take 1 tablet (150 mg total) by mouth daily. 04/10/21   Lelon Perla, MD  letrozole [facility]) 2.5 MG tablet TAKE 1 TABLET BY MOUTH EVERY DAY 05/29/21   Magrinat, Virgie Dad, MD  levocetirizine (XYZAL) 5 MG tablet Take 5 mg by mouth at bedtime. 09/04/20   [provider]  Methylsulfonylmethane (MSM PO) Take 1 capsule by mouth daily.    [provider]  MILK THISTLE PO Take 1 capsule by mouth daily.    [provider]  Misc Natural Products (PYCNOGENOL COMPLEX PO) Take 1 capsule by mouth daily.    [provider]  Omega-3 Fatty Acids (OMEGA 3 PO) Take 1 capsule by mouth daily.    [provider]  OVER THE COUNTER MEDICATION Take 1 capsule by mouth daily. Immunity supplement    [provider]  oxyCODONE (ROXICODONE) 5 MG immediate release tablet Take 1 tablet (5 mg total) by mouth every 4 (four) hours as needed for severe pain. 09/02/21   Gareth Morgan, MD  Polyethyl Glycol-Propyl Glycol (SYSTANE OP) Place 1 drop into both eyes daily.    [provider]  potassium chloride (KLOR-CON) 20 MEQ packet Take 20 mEq by mouth daily as needed.    [provider]  propranolol ER (INDERAL LA) 80 MG 24 hr capsule Take 80 mg by mouth daily.    [provider]  TURMERIC PO Take 1 capsule by mouth daily.    [provider]  venlafaxine XR (EFFEXOR-XR) 75 MG 24 hr capsule TAKE 1 CAPSULE BY MOUTH DAILY WITH BREAKFAST. 12/30/21   Benay Pike, MD  VITAMIN D PO Take 1 capsule by mouth daily.    [provider]  XARELTO 20 MG TABS tablet TAKE 1 TABLET BY MOUTH DAILY WITH SUPPER 09/29/21   Vickie Epley, MD    Physical Exam: BP (!) 165/104   Pulse (!) 106   Temp (!) 103 F (39.4 C) (Oral)   Resp (!) 26   SpO2 99%   General: 81 y.o. year-old female well developed well nourished in  no  acute distress.  Alert and oriented x3. Cardiovascular: Irregular rate and rhythm with no rubs or gallops.  No thyromegaly or JVD noted.  No lower extremity edema. 2/4 pulses in all 4 extremities. Respiratory: Diffuse rales bilaterally.  Poor inspiratory effort. Abdomen: Soft nontender nondistended with normal bowel sounds x4 quadrants. Muskuloskeletal: No cyanosis, clubbing or edema noted bilaterally Neuro: CN II-XII intact, strength, sensation, reflexes Skin: No ulcerative lesions noted or rashes Psychiatry: Judgement and insight appear normal. Mood is appropriate for condition and setting          Labs on Admission:  Basic Metabolic Panel: Recent Labs  Lab 12/19/2021 1813 12/26/2021 1819  NA 132* 130*  K 3.9 3.8  CL 95*  --   CO2 23  --   GLUCOSE 120*  --   BUN 8  --   CREATININE 0.63  --   CALCIUM 9.4  --    Liver Function Tests: Recent Labs  Lab 12/08/2021 1813  AST 50*  ALT 21  ALKPHOS 131*  BILITOT 1.4*  PROT 7.6  ALBUMIN 3.7   No results for input(s): "LIPASE", "AMYLASE" in the last 168 hours. No results for input(s): "AMMONIA" in the last 168 hours. CBC: Recent Labs  Lab 12/30/2021 1813 12/12/2021 1819  WBC 9.5  --   NEUTROABS 8.7*  --   HGB 15.1* 16.3*  HCT 44.3 48.0*  MCV 106.2*  --   PLT 190  --    Cardiac Enzymes: No results for input(s): "CKTOTAL", "CKMB", "CKMBINDEX", "TROPONINI" in the last 168 hours.  BNP (last 3 results) Recent Labs    12/07/2021 1813  BNP 311.6*    ProBNP (last 3 results) No results for input(s): "PROBNP" in the last 8760 hours.  CBG: No results for input(s): "GLUCAP" in the last 168 hours.  Radiological Exams on Admission: DG Chest Portable 1 View  Result Date: 12/13/2021 CLINICAL DATA:  Shortness of breath EXAM: PORTABLE CHEST 1 VIEW COMPARISON:  Previous studies including the examination of 12/28/2020 FINDINGS: Transverse diameter of heart is within normal limits. There are no signs of pulmonary edema. There is patchy  infiltrate in medial right mid and right lower lung fields. Right hemidiaphragm is elevated. There is minimal blunting of right lateral CP angle. There is no pneumothorax. IMPRESSION: There is patchy infiltrate in the medial right mid and right lower lung fields suggesting atelectasis/pneumonia. Possible small right pleural effusion. Electronically Signed   By: Elmer Picker M.D.   On: 12/21/2021 18:37    EKG: I independently viewed the EKG done and my findings are as followed: None available at the time of the visit.  Ordered.  Assessment/Plan Present on Admission:  CAP (community acquired pneumonia)  Principal Problem:   CAP (community acquired pneumonia)  Sepsis secondary to community-acquired pneumonia, POA Presented with complaints of shortness of breath with minimal exertion, productive cough, right-sided pleuritic chest pain worse with taking a deep breath. Febrile on presentation with Tmax 103, pulse 116, respiration rate 33. Personally reviewed chest x-ray on admission which shows right lower lobe infiltrates, suggestive of pneumonia. Started on Rocephin and azithromycin in the ED, continue Obtain baseline procalcitonin and sputum culture. Urine antigen strep pneumoniae and urine antigen Legionella Follow blood cultures and urine culture Narrow down antibiotics as able. DuoNeb every 6 hours Hypersaline nebs twice daily x3 days Mucinex 600 mg twice daily x3 days. Antitussives as needed  Acute hypoxic respiratory failure secondary to community-acquired pneumonia Not on oxygen supplementation at baseline Currently requiring 4  L to maintain O2 saturation greater than 90%. Wean off oxygen supplementation as tolerated Continue to treat underlying condition Incentive spirometer Mobilize as tolerated.  Right-sided chest pain, suspect musculoskeletal High-sensitivity troponin negative Obtain twelve-lead EKG Closely monitor on telemetry If symptoms persist consider 2D  echo.  History of right breast cancer on letrozole, immunocompromised state Resume home regimen Continue to treat underlying active infective process.  Persistent atrial fibrillation on Xarelto Resume home regimen, Xarelto She is on home propranolol for rate control Monitor on telemetry  Essential hypertension, BP is not controlled, elevated Resume home oral antihypertensive IV Lopressor as needed with parameters. Closely monitor vital signs.  Chronic anxiety/depression Resume home regimen.  GERD Resume home regimen.  Chronic diastolic CHF Last 2D echo done on 12/29/2020 showed LVEF 55% Euvolemic on exam Monitor strict I's and O's and daily weight Hold off diuretics for now in the setting of sepsis. Resume home irbesartan.    Critical care time: 65 minutes.    DVT prophylaxis: Home Xarelto.  Code Status: Full code.  Family Communication: None at bedside.  Disposition Plan: Admitted to progressive unit.  Consults called: None.  Admission status: Inpatient status.   Status is: Inpatient The patient requires at least 2 midnights for further evaluation and treatment of present condition.   Kayleen Memos MD [facility] Pager [phone]  If 7PM-7AM, please contact night-coverage [email or web address] Password [facility]  12/15/2021, 8:54 PM

## 2022-01-02 ENCOUNTER — Inpatient Hospital Stay (HOSPITAL_COMMUNITY): Payer: Medicare Other

## 2022-01-02 ENCOUNTER — Other Ambulatory Visit: Payer: Self-pay

## 2022-01-02 ENCOUNTER — Encounter (HOSPITAL_COMMUNITY): Payer: Self-pay | Admitting: Internal Medicine

## 2022-01-02 DIAGNOSIS — J189 Pneumonia, unspecified organism: Secondary | ICD-10-CM | POA: Diagnosis not present

## 2022-01-02 DIAGNOSIS — J9621 Acute and chronic respiratory failure with hypoxia: Secondary | ICD-10-CM

## 2022-01-02 DIAGNOSIS — I5033 Acute on chronic diastolic (congestive) heart failure: Secondary | ICD-10-CM | POA: Diagnosis not present

## 2022-01-02 DIAGNOSIS — R7989 Other specified abnormal findings of blood chemistry: Secondary | ICD-10-CM

## 2022-01-02 DIAGNOSIS — I5031 Acute diastolic (congestive) heart failure: Secondary | ICD-10-CM | POA: Diagnosis not present

## 2022-01-02 DIAGNOSIS — I272 Pulmonary hypertension, unspecified: Secondary | ICD-10-CM

## 2022-01-02 LAB — COMPREHENSIVE METABOLIC PANEL
ALT: 18 U/L (ref 0–44)
AST: 36 U/L (ref 15–41)
Albumin: 3 g/dL — ABNORMAL LOW (ref 3.5–5.0)
Alkaline Phosphatase: 95 U/L (ref 38–126)
Anion gap: 12 (ref 5–15)
BUN: 8 mg/dL (ref 8–23)
CO2: 25 mmol/L (ref 22–32)
Calcium: 8.8 mg/dL — ABNORMAL LOW (ref 8.9–10.3)
Chloride: 93 mmol/L — ABNORMAL LOW (ref 98–111)
Creatinine, Ser: 0.73 mg/dL (ref 0.44–1.00)
GFR, Estimated: >60 mL/min (ref >60)
Glucose, Bld: 119 mg/dL — ABNORMAL HIGH (ref 70–99)
Potassium: 3.4 mmol/L — ABNORMAL LOW (ref 3.5–5.1)
Sodium: 130 mmol/L — ABNORMAL LOW (ref 135–145)
Total Bilirubin: 1.4 mg/dL — ABNORMAL HIGH (ref 0.3–1.2)
Total Protein: 6.6 g/dL (ref 6.5–8.1)

## 2022-01-02 LAB — D-DIMER, QUANTITATIVE: D-Dimer, Quant: 1.11 ug/mL-FEU — ABNORMAL HIGH (ref 0.00–0.50)

## 2022-01-02 LAB — PROCALCITONIN: Procalcitonin: 1.18 ng/mL

## 2022-01-02 LAB — PHOSPHORUS: Phosphorus: 3.7 mg/dL (ref 2.5–4.6)

## 2022-01-02 LAB — BRAIN NATRIURETIC PEPTIDE: B Natriuretic Peptide: 479.9 pg/mL — ABNORMAL HIGH (ref 0.0–100.0)

## 2022-01-02 LAB — TSH: TSH: 3.892 u[IU]/mL (ref 0.350–4.500)

## 2022-01-02 LAB — OSMOLALITY: Osmolality: 255 mOsm/kg — ABNORMAL LOW (ref 275–295)

## 2022-01-02 LAB — MAGNESIUM: Magnesium: 1 mg/dL — ABNORMAL LOW (ref 1.7–2.4)

## 2022-01-02 LAB — TROPONIN I (HIGH SENSITIVITY)
Troponin I (High Sensitivity): 8 ng/L (ref <18)
Troponin I (High Sensitivity): 11 ng/L (ref <18)

## 2022-01-02 LAB — OSMOLALITY, URINE: Osmolality, Ur: 500 mOsm/kg (ref 300–900)

## 2022-01-02 LAB — SODIUM, URINE, RANDOM: Sodium, Ur: 63 mmol/L

## 2022-01-02 MED ORDER — NITROGLYCERIN 0.4 MG SL SUBL
0.4000 mg | SUBLINGUAL_TABLET | SUBLINGUAL | Status: DC | PRN
  Administered 2022-01-02: 0.4 mg via SUBLINGUAL
  Filled 2022-01-02: qty 1

## 2022-01-02 MED ORDER — IOHEXOL 350 MG/ML SOLN
100.0000 mL | Freq: Once | INTRAVENOUS | Status: AC | PRN
  Administered 2022-01-02: 100 mL via INTRAVENOUS

## 2022-01-02 MED ORDER — ALPRAZOLAM 0.5 MG PO TABS
0.5000 mg | ORAL_TABLET | Freq: Once | ORAL | Status: AC | PRN
Start: 2022-01-02 — End: 2022-01-02
  Administered 2022-01-02: 0.5 mg via ORAL
  Filled 2022-01-02: qty 1

## 2022-01-02 MED ORDER — FUROSEMIDE 10 MG/ML IJ SOLN
40.0000 mg | Freq: Two times a day (BID) | INTRAMUSCULAR | Status: DC
  Administered 2022-01-02 – 2022-01-03 (×2): 40 mg via INTRAVENOUS
  Filled 2022-01-02 (×2): qty 4

## 2022-01-02 MED ORDER — POTASSIUM CHLORIDE CRYS ER 20 MEQ PO TBCR
20.0000 meq | EXTENDED_RELEASE_TABLET | Freq: Once | ORAL | Status: AC
  Administered 2022-01-02: 20 meq via ORAL
  Filled 2022-01-02: qty 1

## 2022-01-02 MED ORDER — MAGNESIUM SULFATE 2 GM/50ML IV SOLN
2.0000 g | Freq: Once | INTRAVENOUS | Status: AC
  Administered 2022-01-02: 2 g via INTRAVENOUS
  Filled 2022-01-02: qty 50

## 2022-01-02 MED ORDER — POTASSIUM CHLORIDE CRYS ER 20 MEQ PO TBCR
20.0000 meq | EXTENDED_RELEASE_TABLET | Freq: Once | ORAL | Status: AC
Start: 2022-01-02 — End: 2022-01-02
  Administered 2022-01-02: 20 meq via ORAL
  Filled 2022-01-02: qty 1

## 2022-01-02 NOTE — Progress Notes (Signed)
Dr. Myna Hidalgo notified of patients Magnesium results and potassium results. Magnesium 2 gm was given IV for replacement and potassium 80mq was given PO for replacements. Will continue to monitor patient.

## 2022-01-02 NOTE — Progress Notes (Signed)
PROGRESS NOTE    [name]  DGU:[id number] DOB: 1940/12/05 DOA: 12/10/2021 PCP: [facility] @ [place]  Outpatient Specialists:     Brief Narrative:  As per H&P done on admission: " [name] is a 81 y.o. female with medical history significant for persistent atrial fibrillation on Xarelto, asthma, essential hypertension, chronic diastolic CHF, right breast cancer on letrozole, history of syncope, generalized anxiety disorder, moderate pulmonary hypertension, who presented to [facility] ED from home via EMS due to progressively worsening shortness of breath.  Associated with a productive cough and right-sided chest pain worse with inspiration of 3 days duration.  Her right-sided chest pain worsened today and she decided to come to the ED for further evaluation.     On EMS arrival the patient was noted to be hypoxic with O2 saturation of 85% on room air.  She received nebulizer treatment in route and was placed on nonrebreather.  Endorses subjective fevers.  While in the ED she is noted to be febrile with a Tmax of 103.  Right lower lobe infiltrate seen on chest x-ray, suggestive of right-sided pneumonia.  High-sensitivity troponin negative.  BNP greater than 300 but euvolemic on exam.  Placed on 4 L nasal cannula with O2 saturation in the mid 90s with uncontrolled hypertension, A-fib RVR.  Started on Rocephin and azithromycin empirically.  TRH, hospitalist service, was asked to admit.   ED Course: Tmax 103.  BP 146/92, pulse 112, respiration rate 28, O2 saturation 93% on 4 L.  Labs studies remarkable for serum sodium 130, alkaline phosphatase 131, AST 50, total bilirubin 1.4, BNP 311, high-sensitivity troponin 11.  CBC with hemoglobin of 16.3.  MCV 106.2".  01/02/2022: Patient seen alongside patient's nurse.  Patient continues to report shortness of breath with minimal exertion.  Patient also reports associated chest tightness with activity.  Troponins have been negative.   Cardiac BNP was mildly elevated.  Patient has COPD with exacerbation.  At that has been concern for possible pneumonia.  Cardiology team has been consulted.  We will repeat chest x-ray.  We will diurese patient.  Check procalcitonin.  Continue antibiotics for now.   Assessment & Plan:   Principal Problem:   CAP (community acquired pneumonia)   Sepsis secondary to community-acquired pneumonia, POA Presented with complaints of shortness of breath with minimal exertion, productive cough, right-sided pleuritic chest pain worse with taking a deep breath. Febrile on presentation with Tmax 103, pulse 116, respiration rate 33. Personally reviewed chest x-ray on admission which shows right lower lobe infiltrates, suggestive of pneumonia. Started on Rocephin and azithromycin in the ED, continue Obtain baseline procalcitonin and sputum culture. Urine antigen strep pneumoniae and urine antigen Legionella Follow blood cultures and urine culture Narrow down antibiotics as able. DuoNeb every 6 hours Hypersaline nebs twice daily x3 days Mucinex 600 mg twice daily x3 days. Antitussives as needed 01/02/2022: Continue to assess patient.  Follow work-up.   Acute hypoxic respiratory failure secondary to community-acquired pneumonia Not on oxygen supplementation at baseline Currently requiring 4 L to maintain O2 saturation greater than 90%. Wean off oxygen supplementation as tolerated Continue to treat underlying condition Incentive spirometer Mobilize as tolerated. 01/02/2022: Patient continues to wheeze.  Continue treatment for COPD with exacerbation.  Patient also reports dyspnea with minimal activity.  Gentle diuresis.  Cardiac BNP is elevated.  D-dimer is elevated.  We will proceed with CTA chest to rule out PE.  Prior history of moderate pulmonary hypertension.  We will repeat  echocardiogram.  Hopefully, the dyspnea on exertion is not secondary to severe pulmonary hypertension.  We will have low  threshold to consult the pulmonary team as well.  Guarded prognosis.   Right-sided chest pain, suspect musculoskeletal High-sensitivity troponin negative Obtain twelve-lead EKG Closely monitor on telemetry If symptoms persist consider 2D echo. 01/02/2022: Follow CTA chest.  Follow echocardiogram.   History of right breast cancer on letrozole, immunocompromised state Resume home regimen Continue to treat underlying active infective process.   Persistent atrial fibrillation on Xarelto Resume home regimen, Xarelto She is on home propranolol for rate control Monitor on telemetry   Essential hypertension, BP is not controlled, elevated Resume home oral antihypertensive IV Lopressor as needed with parameters. Closely monitor vital signs.   Chronic anxiety/depression Resume home regimen.   GERD Resume home regimen.   Chronic diastolic CHF Last 2D echo done on 12/29/2020 showed LVEF 55% Euvolemic on exam Monitor strict I's and O's and daily weight Hold off diuretics for now in the setting of sepsis. Resume home irbesartan. 01/02/2022: BNP is elevated.  Start patient on IV Lasix.   Pulmonary hypertension: -Patient has history of moderate pulmonary hypertension. -Repeat echocardiogram. -Hopefully, worsening shortness of breath is not secondary to severe pulmonary hypertension.  Elevated D-dimer: -Proceed with CTA chest to rule out pulmonary embolism..   DVT prophylaxis: Xarelto Code Status: Full code Family Communication:  Disposition Plan: This will depend on hospital course   Consultants:  Cardiology  Procedures:  None.  Antimicrobials:  IV ceftriaxone IV Zosyn   Subjective: Dyspnea on minimal exertion.  Pleuritic chest tightness with activity.  Objective: Vitals:   01/02/22 0455 01/02/22 0725 01/02/22 0726 01/02/22 0729  BP:    123/86  Pulse: 71     Resp: 20   20  Temp: 97.8 F (36.6 C)   97.7 F (36.5 C)  TempSrc:    Oral  SpO2: 95% 95% 97% 97%   Weight: 63.8 kg     Height:        Intake/Output Summary (Last 24 hours) at 01/02/2022 0739 Last data filed at 01/02/2022 0643 Gross per 24 hour  Intake 290 ml  Output 850 ml  Net -560 ml   Filed Weights   12/25/2021 2313 01/02/22 0455  Weight: 63.8 kg 63.8 kg    Examination:  General exam: Appears calm and comfortable  Respiratory system: Decreased air entry with expiratory wheeze. Cardiovascular system: S1 & S2, irregular.. Gastrointestinal system: Abdomen is nondistended, soft and nontender. No organomegaly or masses felt. Normal bowel sounds heard. Central nervous system: Awake and alert.  Patient moves all extremities.     Data Reviewed: I have personally reviewed following labs and imaging studies  CBC: Recent Labs  Lab 12/14/2021 1813 01/03/2022 1819 12/22/2021 2249  WBC 9.5  --  9.5  NEUTROABS 8.7*  --  8.3*  HGB 15.1* 16.3* 14.7  HCT 44.3 48.0* 41.9  MCV 106.2*  --  103.5*  PLT 190  --  818   Basic Metabolic Panel: Recent Labs  Lab 01/03/2022 1813 12/06/2021 1819 01/02/22 0029  NA 132* 130* 130*  K 3.9 3.8 3.4*  CL 95*  --  93*  CO2 23  --  25  GLUCOSE 120*  --  119*  BUN 8  --  8  CREATININE 0.63  --  0.73  CALCIUM 9.4  --  8.8*  MG  --   --  1.0*  PHOS  --   --  3.7   GFR: Estimated  Creatinine Clearance: 48.3 mL/min (by C-G formula based on SCr of 0.73 mg/dL). Liver Function Tests: Recent Labs  Lab 12/29/2021 1813 01/02/22 0029  AST 50* 36  ALT 21 18  ALKPHOS 131* 95  BILITOT 1.4* 1.4*  PROT 7.6 6.6  ALBUMIN 3.7 3.0*   No results for input(s): "LIPASE", "AMYLASE" in the last 168 hours. No results for input(s): "AMMONIA" in the last 168 hours. Coagulation Profile: No results for input(s): "INR", "PROTIME" in the last 168 hours. Cardiac Enzymes: No results for input(s): "CKTOTAL", "CKMB", "CKMBINDEX", "TROPONINI" in the last 168 hours. BNP (last 3 results) No results for input(s): "PROBNP" in the last 8760 hours. HbA1C: No results for  input(s): "HGBA1C" in the last 72 hours. CBG: No results for input(s): "GLUCAP" in the last 168 hours. Lipid Profile: No results for input(s): "CHOL", "HDL", "LDLCALC", "TRIG", "CHOLHDL", "LDLDIRECT" in the last 72 hours. Thyroid Function Tests: No results for input(s): "TSH", "T4TOTAL", "FREET4", "T3FREE", "THYROIDAB" in the last 72 hours. Anemia Panel: No results for input(s): "VITAMINB12", "FOLATE", "FERRITIN", "TIBC", "IRON", "RETICCTPCT" in the last 72 hours. Urine analysis:    Component Value Date/Time   COLORURINE YELLOW 12/28/2020 1641   APPEARANCEUR HAZY (A) 12/28/2020 1641   LABSPEC 1.009 12/28/2020 1641   PHURINE 6.0 12/28/2020 1641   GLUCOSEU NEGATIVE 12/28/2020 1641   HGBUR NEGATIVE 12/28/2020 1641   Clint 12/28/2020 1641   KETONESUR NEGATIVE 12/28/2020 1641   PROTEINUR NEGATIVE 12/28/2020 1641   UROBILINOGEN 0.2 01/22/2013 1345   NITRITE NEGATIVE 12/28/2020 1641   LEUKOCYTESUR LARGE (A) 12/28/2020 1641   Sepsis Labs: '@LABRCNTIP'$ (procalcitonin:4,lacticidven:4)  ) Recent Results (from the past 240 hour(s))  MRSA Next Gen by PCR, Nasal     Status: None   Collection Time: 12/30/2021 10:32 PM  Result Value Ref Range Status   MRSA by PCR Next Gen NOT DETECTED NOT DETECTED Final    Comment: (NOTE) The GeneXpert MRSA Assay (FDA approved for NASAL specimens only), is one component of a comprehensive MRSA colonization surveillance program. It is not intended to diagnose MRSA infection nor to guide or monitor treatment for MRSA infections. Test performance is not FDA approved in patients less than 85 years old. Performed at [facility], [place] [address] [place], [place] [address]          Radiology Studies: DG Chest Portable 1 View  Result Date: 12/31/2021 CLINICAL DATA:  Shortness of breath EXAM: PORTABLE CHEST 1 VIEW COMPARISON:  Previous studies including the examination of 12/28/2020 FINDINGS: Transverse diameter of heart is within normal  limits. There are no signs of pulmonary edema. There is patchy infiltrate in medial right mid and right lower lung fields. Right hemidiaphragm is elevated. There is minimal blunting of right lateral CP angle. There is no pneumothorax. IMPRESSION: There is patchy infiltrate in the medial right mid and right lower lung fields suggesting atelectasis/pneumonia. Possible small right pleural effusion. Electronically Signed   By: Elmer Picker M.D.   On: 12/21/2021 18:37        Scheduled Meds:  fluticasone furoate-vilanterol  1 puff Inhalation Daily   guaiFENesin  600 mg Oral BID   ipratropium-albuterol  3 mL Nebulization Q6H   irbesartan  150 mg Oral Daily   letrozole  2.5 mg Oral Daily   propranolol ER  80 mg Oral Daily   rivaroxaban  20 mg Oral Q supper   sodium chloride HYPERTONIC  4 mL Nebulization BID   venlafaxine XR  75 mg Oral Q breakfast  Continuous Infusions:  azithromycin (ZITHROMAX) 500 mg in sodium chloride 0.9 % 250 mL IVPB     cefTRIAXone (ROCEPHIN)  IV       LOS: 1 day    Time spent: 55 minutes    Dana Allan, MD  [facility] Pager #: [phone] 7PM-7AM contact night coverage as above

## 2022-01-02 NOTE — Progress Notes (Signed)
Pt had one episode of chest pain/pressure this morning, controlled with 0.4 mg SL nitro, vitals stable, EKG done, troponin and D-dimer sent.  Urine c/s sent  Pt stable after one dose of nitro, denies chest pain and distress  Will continue to monitor  Palma Holter, RN

## 2022-01-02 NOTE — Consult Note (Addendum)
Cardiology Consultation:   Patient ID: [name] MRN: [id number]; DOB: [birth year]  Admit date: 12/22/2021 Date of Consult: 01/02/2022  PCP:  [facility] [place] Prov[facility] Cardiologist:  Kirk Ruths, MD        Patient Profile:   [name] is a 81 y.o. female with a hx of atrial fib on propranolol 80 mg qd and Xarelto, HTN, asthma, breast CA, anxiety, who is being seen 01/02/2022 for the evaluation of CHF & rapid atrial fib at the request of Dr Marthenia Rolling.  History of Present Illness:   [name] was seen in the office 11/18/2021, in afib, HR controlled, continue Xarelto, no volume overload, wt 140 lbs, has chronic LE edema.  Pt admitted 07/28 with R chest pain (worse w/ deep inspiration), +cough w/ sputum, O2 sats 85% on room air, +fever, RVR on telem, CAP on CXR. Mg 1.0, procal neg, BNP 311.6, trop peak 15.  Pt given IV Lasix 20 mg x 1 and put on 40 mg IV qd, Cards asked to see for CHF and atrial fib, RVR.  [name] is vigilant about her weight, weighing daily.  From Thursday to Friday, she gained 2 pounds, otherwise her weight has been stable.  She has chronic problems with lower extremity edema, not bad now.  She has had problems with lower extremity edema in the past, her edema is at baseline.  Before her acute illness, her respiratory status was at baseline.  She began feeling bad several days before admission, developing cough and fever.  Last p.m., her temperature was up to 103.  Today she feels a little hot, but not as bad.  Her cough is starting to improve as well.  Her O2 sats are 100% on oxygen, but which she does not have at home.  However, she still has increased work of breathing.   Past Medical History:  Diagnosis Date   Anemia    history of   Anxiety    Arthritis    Asthma    Breast cancer ([place])    Cancer ([place])    skin cancers, one melanoma    Complication of anesthesia    patient woke up during  colonoscopy   Concussion    8 yrs. 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    Keratosis, actinic    Melanoma ([place])    Persistent atrial fibrillation ([place]) 12/16/2016   CHA2DS2VASC score 3   Pneumonia    hx of     Past Surgical History:  Procedure Laterality Date   brachial skin removal due to deforimity Bilateral    BREAST ENHANCEMENT SURGERY Left 11/14/2018   Procedure: LEFT BREAST AUGMENTATION;  Surgeon: [name], MD;  Location: [place];  Service: Plastics;  Laterality: Left;   BREAST RECONSTRUCTION Right 06/26/2018   BREAST RECONSTRUCTION WITH PLACEMENT OF TISSUE EXPANDER AND ALLODERM Right 06/26/2018   Procedure: RIGHT BREAST RECONSTRUCTION WITH PLACEMENT OF TISSUE EXPANDER;  Surgeon: [name], MD;  Location: [place];  Service: Plastics;  Laterality: Right;   BREAST RECONSTRUCTION WITH PLACEMENT OF TISSUE EXPANDER  AND FLEX HD (ACELLULAR HYDRATED DERMIS) Right 01/28/2017    RIGHT BREAST RECONSTRUCTION WITH PLACEMENT OF TISSUE EXPANDER AND ALLODERM;  Surgeon: [name], MD;  Location: [place];  Service: Plastics;  Laterality: Right;   BUNIONECTOMY     left    CATARACT EXTRACTION     bil[place] cataract surgery    COLONOSCOPY W/ POLYPECTOMY     JOINT REPLACEMENT     KNEE ARTHROSCOPY     left    LATISSIMUS FLAP TO BREAST Right 06/26/2018   Procedure: LATISSIMUS FLAP TO RIGHT CHEST;  Surgeon: [name], MD;  Location: [place];  Service: Plastics;  Laterality: Right;   MASTECTOMY Right    MASTECTOMY W/ SENTINEL NODE BIOPSY Right 01/28/2017   Procedure: RIGHT TOTAL MASTECTOMY WITH SENTINEL LYMPH NODE BIOPSY;  Surgeon:  Excell Seltzer, MD;  Location: [place];  Service: General;  Laterality: Right;   MASTOPEXY Left 11/14/2018   Procedure: LEFT BREAST MASTOPEXY;  Surgeon: [name], MD;  Location: [place];  Service: Plastics;  Laterality: Left;   MELANOMA EXCISION Left    polyp removed from uterus      REMOVAL OF TISSUE EXPANDER AND PLACEMENT OF IMPLANT Right 11/14/2018   Procedure: REMOVAL OF RIGHT TISSUE EXPANDER AND PLACEMENT OF SALINE IMPLANT;  Surgeon: [name], MD;  Location: [place];  Service: Plastics;  Laterality: Right;   right wrist pinning      skin cancers removed     TISSUE EXPANDER PLACEMENT Right 02/27/2018   Procedure: REMOVAL OF TISSUE EXPANDER;  Surgeon: [name], MD;  Location: [place];  Service: Plastics;  Laterality: Right;   TOTAL KNEE ARTHROPLASTY Right 01/29/2013   Procedure: RIGHT TOTAL KNEE ARTHROPLASTY;  Surgeon: Gearlean Alf, MD;  Location: [facility];  Service: Orthopedics;  Laterality: Right;   TOTAL KNEE ARTHROPLASTY Left 08/29/2017   Procedure: LEFT TOTAL KNEE ARTHROPLASTY;  Surgeon: Gaynelle Arabian, MD;  Location: [facility];  Service: Orthopedics;  Laterality: Left;     Home Medications:  Prior to Admission medications   Medication Sig Start Date End Date Taking? Authorizing Provider  albuterol (VENTOLIN HFA) 108 (90 Base) MCG/ACT inhaler Inhale 1 puff into the lungs every 6 (six) hours as needed for wheezing or shortness of breath.    [provider]  ALPRAZolam Duanne Moron) 0.5 MG tablet Take 0.5 mg by mouth daily as needed for anxiety.     [provider]  B Complex Vitamins (VITAMIN B COMPLEX PO) Take 1 capsule by mouth daily.    [provider]  BREO ELLIPTA 100-25 MCG/INH AEPB Inhale 1 puff into the lungs daily. 12/22/16   [provider]  COLLAGEN PO Take 1 capsule by mouth daily.    [provider]  desonide (DESOWEN) 0.05 % cream Apply 1 application topically 2 (two) times daily  as needed (for skin irritation.).    [provider]  diclofenac sodium (VOLTAREN) 1 % GEL Apply 2 g topically daily as needed (pain).     [provider]  EPINEPHrine 0.3 mg/0.3 mL IJ SOAJ injection Inject 0.3 mg into the muscle as needed for anaphylaxis.    [provider]  famotidine (PEPCID) 20 MG tablet Take 1 tablet by mouth as directed. 06/29/19   [provider]  fluorouracil (EFUDEX) 5 % cream Apply 1 application topically as directed. Per MD instructions, for skin cancer 12/03/20   [provider]  fluticasone (FLONASE) 50 MCG/ACT nasal spray Place 2 sprays into both nostrils  daily.    [provider]  furosemide (LASIX) 20 MG tablet Take 1 tablet (20 mg total) by mouth daily as needed. 11/18/21   Deberah Pelton, NP  irbesartan (AVAPRO) 150 MG tablet Take 1 tablet (150 mg total) by mouth daily. 04/10/21   Lelon Perla, MD  letrozole [facility]) 2.5 MG tablet TAKE 1 TABLET BY MOUTH EVERY DAY 05/29/21   Magrinat, Virgie Dad, MD  levocetirizine (XYZAL) 5 MG tablet Take 5 mg by mouth at bedtime. 09/04/20   [provider]  Methylsulfonylmethane (MSM PO) Take 1 capsule by mouth daily.    [provider]  MILK THISTLE PO Take 1 capsule by mouth daily.    [provider]  Misc Natural Products (PYCNOGENOL COMPLEX PO) Take 1 capsule by mouth daily.    [provider]  Omega-3 Fatty Acids (OMEGA 3 PO) Take 1 capsule by mouth daily.    [provider]  OVER THE COUNTER MEDICATION Take 1 capsule by mouth daily. Immunity supplement    [provider]  oxyCODONE (ROXICODONE) 5 MG immediate release tablet Take 1 tablet (5 mg total) by mouth every 4 (four) hours as needed for severe pain. 09/02/21   Gareth Morgan, MD  Polyethyl Glycol-Propyl Glycol (SYSTANE OP) Place 1 drop into both eyes daily.    [provider]  potassium chloride (KLOR-CON) 20 MEQ packet Take 20 mEq by mouth daily as  needed.    [provider]  propranolol ER (INDERAL LA) 80 MG 24 hr capsule Take 80 mg by mouth daily.    [provider]  TURMERIC PO Take 1 capsule by mouth daily.    [provider]  venlafaxine XR (EFFEXOR-XR) 75 MG 24 hr capsule TAKE 1 CAPSULE BY MOUTH DAILY WITH BREAKFAST. 12/30/21   Benay Pike, MD  VITAMIN D PO Take 1 capsule by mouth daily.    [provider]  XARELTO 20 MG TABS tablet TAKE 1 TABLET BY MOUTH DAILY WITH SUPPER 09/29/21   Vickie Epley, MD    Inpatient Medications: Scheduled Meds:  fluticasone furoate-vilanterol  1 puff Inhalation Daily   furosemide  40 mg Intravenous BID   guaiFENesin  600 mg Oral BID   ipratropium-albuterol  3 mL Nebulization Q6H   irbesartan  150 mg Oral Daily   letrozole  2.5 mg Oral Daily   potassium chloride  20 mEq Oral Once   propranolol ER  80 mg Oral Daily   rivaroxaban  20 mg Oral Q supper   sodium chloride HYPERTONIC  4 mL Nebulization BID   venlafaxine XR  75 mg Oral Q breakfast   Continuous Infusions:  azithromycin (ZITHROMAX) 500 mg in sodium chloride 0.9 % 250 mL IVPB     cefTRIAXone (ROCEPHIN)  IV     magnesium sulfate bolus IVPB     PRN Meds: acetaminophen, famotidine, guaiFENesin-dextromethorphan, melatonin, metoprolol tartrate, nitroGLYCERIN, polyethylene glycol, prochlorperazine  Allergies:    Allergies  Allergen Reactions   Chlorhexidine Gluconate Hives, Swelling and Rash       "ChloraPrep "   Doxycycline Nausea And Vomiting   Codeine Nausea Only and Other (See Comments)    Pt can tolerate when pre-medicated   Doxycycline Nausea And Vomiting   Nsaids     avoid due to gerd   Nsaids Other (See Comments)    Gi bleed   Tylenol [Acetaminophen]     Avoid due to GERD   Adhesive [Tape] Rash    Dermabond  Chlorhexidine Swelling and Rash   Codeine Nausea Only   Tylenol [Acetaminophen] Other (See Comments)    GI bleed    Social History:   Social History    Socioeconomic History   Marital status: Married    Spouse name: Not on file   Number of children: Not on file   Years of education: Not on file   Highest education level: Not on file  Occupational History   Not on file  Tobacco Use   Smoking status: Never   Smokeless tobacco: Never  Vaping Use   Vaping Use: Never used  Substance and Sexual Activity   Alcohol use: Yes    Alcohol/week: 1.0 standard drink of alcohol    Types: 1 Glasses of wine per week    Comment: occ   Drug use: No   Sexual activity: Not on file  Other Topics Concern   Not on file  Social History Narrative   ** Merged History Encounter **       Social Determinants of Health   Financial Resource Strain: Not on file  Food Insecurity: Not on file  Transportation Needs: Not on file  Physical Activity: Not on file  Stress: Not on file  Social Connections: Not on file  Intimate Partner Violence: Not on file    Family History:   Family History  Problem Relation Age of Onset   Hypertension Other    COPD Father    Breast cancer Other        dx in her 54s-30s     ROS:  Please see the history of present illness.  All other ROS reviewed and negative.     Physical Exam/Data:   Vitals:   01/02/22 0726 01/02/22 0729 01/02/22 0929 01/02/22 1112  BP:  123/86 132/69 110/84  Pulse:  82 86 76  Resp:  '20 20 20  '$ Temp:  97.7 F (36.5 C)  98.4 F (36.9 C)  TempSrc:  Oral  Oral  SpO2: 97% 97% 99% 99%  Weight:      Height:        Intake/Output Summary (Last 24 hours) at 01/02/2022 1135 Last data filed at 01/02/2022 5885 Gross per 24 hour  Intake 290 ml  Output 850 ml  Net -560 ml      01/02/2022    4:55 AM 12/30/2021   11:13 PM 11/18/2021    1:53 PM  Last 3 Weights  Weight (lbs) 140 lb 10.5 oz 140 lb 10.5 oz 140 lb 9.6 oz  Weight (kg) 63.8 kg 63.8 kg 63.776 kg     Body mass index is 27.47 kg/m.  General:  Well nourished, well developed, in no acute distress HEENT: normal Neck: JVD  10  cn Vascular: No carotid bruits; Distal pulses 2+ bilaterally Cardiac:  normal S1, S2; Irreg R&R; no murmur  Lungs: Scattered rales bilaterally, no wheezing, rhonchi   Abd: soft, nontender, no hepatomegaly  Ext: no edema Musculoskeletal:  No deformities, BUE and BLE strength weak but equal Skin: warm and dry  Neuro:  CNs 2-12 intact, no focal abnormalities noted Psych:  Normal affect   EKG:  The EKG was personally reviewed and demonstrates:  07/29 ECG is atrial fib, HR 75, no acute ischemic changes Telemetry:  Telemetry was personally reviewed and demonstrates:  atrial fib, RVR at first, now heart rate is controlled  Relevant CV Studies:  Echocardiogram 12/29/2020  IMPRESSIONS   1. Left ventricular ejection fraction, by estimation, is 55%. The left  ventricle has  normal function. The left ventricle has no regional wall  motion abnormalities. Left ventricular diastolic parameters are  indeterminate.   2. Right ventricular systolic function is mildly reduced. The right  ventricular size is mildly enlarged. There is moderately elevated  pulmonary artery systolic pressure. The estimated right ventricular  systolic pressure is 06.2 mmHg.   3. Left atrial size was moderately dilated.   4. Right atrial size was mildly dilated.   5. The mitral valve is normal in structure. Trivial mitral valve  regurgitation. No evidence of mitral stenosis.   6. The aortic valve is tricuspid. Aortic valve regurgitation is mild. No aortic stenosis is present.   7. The inferior vena cava is normal in size with greater than 50%  respiratory variability, suggesting right atrial pressure of 3 mmHg.   8. The patient was in atrial fibrillation.    Laboratory Data:  High Sensitivity Troponin:   Recent Labs  Lab 12/17/2021 1813 12/31/2021 2151  TROPONINIHS 11 15     Chemistry Recent Labs  Lab 12/19/2021 1813 12/29/2021 1819 01/02/22 0029  NA 132* 130* 130*  K 3.9 3.8 3.4*  CL 95*  --  93*  CO2 23  --  25   GLUCOSE 120*  --  119*  BUN 8  --  8  CREATININE 0.63  --  0.73  CALCIUM 9.4  --  8.8*  MG  --   --  1.0*  GFRNONAA >60  --  >60  ANIONGAP 14  --  12    Recent Labs  Lab 12/29/2021 1813 01/02/22 0029  PROT 7.6 6.6  ALBUMIN 3.7 3.0*  AST 50* 36  ALT 21 18  ALKPHOS 131* 95  BILITOT 1.4* 1.4*   Lipids No results for input(s): "CHOL", "TRIG", "HDL", "LABVLDL", "LDLCALC", "CHOLHDL" in the last 168 hours.  Hematology Recent Labs  Lab 12/18/2021 1813 12/24/2021 1819 12/09/2021 2249  WBC 9.5  --  9.5  RBC 4.17  --  4.05  HGB 15.1* 16.3* 14.7  HCT 44.3 48.0* 41.9  MCV 106.2*  --  103.5*  MCH 36.2*  --  36.3*  MCHC 34.1  --  35.1  RDW 13.2  --  13.2  PLT 190  --  153   Thyroid No results for input(s): "TSH", "FREET4" in the last 168 hours.  BNP Recent Labs  Lab 12/29/2021 1813  BNP 311.6*    DDimer No results for input(s): "DDIMER" in the last 168 hours.   Radiology/Studies:  DG Chest Portable 1 View  Result Date: 12/08/2021 CLINICAL DATA:  Shortness of breath EXAM: PORTABLE CHEST 1 VIEW COMPARISON:  Previous studies including the examination of 12/28/2020 FINDINGS: Transverse diameter of heart is within normal limits. There are no signs of pulmonary edema. There is patchy infiltrate in medial right mid and right lower lung fields. Right hemidiaphragm is elevated. There is minimal blunting of right lateral CP angle. There is no pneumothorax. IMPRESSION: There is patchy infiltrate in the medial right mid and right lower lung fields suggesting atelectasis/pneumonia. Possible small right pleural effusion. Electronically Signed   By: Elmer Picker M.D.   On: 12/13/2021 18:37     Assessment and Plan:   Acute on chronic diastolic CHF - She has mild volume overload on exam -Her BNP is mildly elevated and there is some volume overload on exam. - She got Lasix 20 mg IV yesterday, and is to get Lasix 40 mg BID, starting at 6 PM today. -Reassess in a.m., volume status may  be  improved to the point that she can be transition to oral meds -Prior to admission she was taking Lasix 20 mg p.o. daily as needed for weight gain and with that was taking potassium 20 mEq.  2.  Hypokalemia: - Same initially normal at 3.9, but dropped to 3.4 after Lasix 20 mg yesterday. -She had 20 mEq given, I ordered an additional 20 and will start 40 mEq daily while on IV Lasix. -That may not be enough, follow-up on BMET in the morning  3.  Hypomagnesemia -Magnesium on admission was 1, previous levels were normal. -She has been given 2 g of mag sulfate, I will repeat this - A.m. recheck is ordered  4. CAP -Patient still requires supplemental oxygen, but O2 sats are 100% - Lung sounds are still abnormal and she still has fever and cough - Procalcitonin was normal and white count was normal, hopefully she will improve quickly - Antibiotics per IM  Otherwise, per IM  Risk Assessment/Risk Scores:       New York Heart Association (NYHA) Functional Class NYHA Class II  CHA2DS2-VASc Score = 6   This indicates a 9.7% annual risk of stroke. The patient's score is based upon: CHF History: 1 HTN History: 1 Diabetes History: 0 Stroke History: 0 Vascular Disease History: 1 Age Score: 2 Gender Score: 1    For questions or updates, please contact [place] Please consult [email or web address] for contact info under    Signed, Rosaria Ferries, PA-C  01/02/2022 11:35 AM  Personally seen and examined. Agree with above.  81 year old with acute on chronic diastolic heart failure, hypokalemia, hypomagnesemia, community-acquired pneumonia.  Crackles heard on lung exam, A-fib well rate controlled  Echocardiogram with EF 55% with secondary pulmonary hypertension 50 mmHg troponins normal  Assessment and plan:  Acute diastolic heart failure - Agree with IV Lasix 40 mg twice daily.  Continue to reassess.  May need 40 mg of Lasix daily instead of 20 at home.  Hypokalemia - Agree with  40 mEq daily while on IV Lasix.  Hypomagnesemia - Repeating magnesium 2 g.  Community-acquired pneumonia - Appreciated on lung exam.  Antibiotics per primary team.  Permanent atrial fibrillation - Continue with great rate control.  On Xarelto for anticoagulation.  Candee Furbish, MD

## 2022-01-03 ENCOUNTER — Inpatient Hospital Stay (HOSPITAL_COMMUNITY): Payer: Medicare Other

## 2022-01-03 ENCOUNTER — Inpatient Hospital Stay (HOSPITAL_COMMUNITY): Payer: Medicare Other | Admitting: Registered Nurse

## 2022-01-03 ENCOUNTER — Other Ambulatory Visit (HOSPITAL_COMMUNITY): Payer: Medicare Other

## 2022-01-03 DIAGNOSIS — J9621 Acute and chronic respiratory failure with hypoxia: Secondary | ICD-10-CM | POA: Diagnosis not present

## 2022-01-03 DIAGNOSIS — I5033 Acute on chronic diastolic (congestive) heart failure: Secondary | ICD-10-CM

## 2022-01-03 DIAGNOSIS — G9340 Encephalopathy, unspecified: Secondary | ICD-10-CM

## 2022-01-03 DIAGNOSIS — J690 Pneumonitis due to inhalation of food and vomit: Secondary | ICD-10-CM

## 2022-01-03 DIAGNOSIS — J189 Pneumonia, unspecified organism: Secondary | ICD-10-CM | POA: Diagnosis not present

## 2022-01-03 DIAGNOSIS — J45901 Unspecified asthma with (acute) exacerbation: Secondary | ICD-10-CM | POA: Diagnosis not present

## 2022-01-03 LAB — HEMOGLOBIN A1C
Hgb A1c MFr Bld: 4.9 % (ref 4.8–5.6)
Mean Plasma Glucose: 93.93 mg/dL

## 2022-01-03 LAB — TYPE AND SCREEN
ABO/RH(D): O POS
Antibody Screen: NEGATIVE

## 2022-01-03 LAB — PROTIME-INR
INR: 3.8 — ABNORMAL HIGH (ref 0.8–1.2)
Prothrombin Time: 37.3 seconds — ABNORMAL HIGH (ref 11.4–15.2)

## 2022-01-03 LAB — BASIC METABOLIC PANEL
Anion gap: 14 (ref 5–15)
Anion gap: 12 (ref 5–15)
BUN: 13 mg/dL (ref 8–23)
BUN: 13 mg/dL (ref 8–23)
CO2: 24 mmol/L (ref 22–32)
CO2: 26 mmol/L (ref 22–32)
Calcium: 9.5 mg/dL (ref 8.9–10.3)
Calcium: 8.8 mg/dL — ABNORMAL LOW (ref 8.9–10.3)
Chloride: 82 mmol/L — ABNORMAL LOW (ref 98–111)
Chloride: 85 mmol/L — ABNORMAL LOW (ref 98–111)
Creatinine, Ser: 0.62 mg/dL (ref 0.44–1.00)
Creatinine, Ser: 0.68 mg/dL (ref 0.44–1.00)
GFR, Estimated: >60 mL/min (ref >60)
GFR, Estimated: >60 mL/min (ref >60)
Glucose, Bld: 110 mg/dL — ABNORMAL HIGH (ref 70–99)
Glucose, Bld: 165 mg/dL — ABNORMAL HIGH (ref 70–99)
Potassium: 3.7 mmol/L (ref 3.5–5.1)
Potassium: 2.6 mmol/L — CL (ref 3.5–5.1)
Sodium: 120 mmol/L — ABNORMAL LOW (ref 135–145)
Sodium: 123 mmol/L — ABNORMAL LOW (ref 135–145)

## 2022-01-03 LAB — CBC
HCT: 31.8 % — ABNORMAL LOW (ref 36.0–46.0)
Hemoglobin: 11.8 g/dL — ABNORMAL LOW (ref 12.0–15.0)
MCH: 37.1 pg — ABNORMAL HIGH (ref 26.0–34.0)
MCHC: 37.1 g/dL — ABNORMAL HIGH (ref 30.0–36.0)
MCV: 100 fL (ref 80.0–100.0)
Platelets: 145 10*3/uL — ABNORMAL LOW (ref 150–400)
RBC: 3.18 MIL/uL — ABNORMAL LOW (ref 3.87–5.11)
RDW: 12.2 % (ref 11.5–15.5)
WBC: 20.8 10*3/uL — ABNORMAL HIGH (ref 4.0–10.5)
nRBC: 0.1 % (ref 0.0–0.2)

## 2022-01-03 LAB — HEPATIC FUNCTION PANEL
ALT: 26 U/L (ref 0–44)
AST: 59 U/L — ABNORMAL HIGH (ref 15–41)
Albumin: 2.3 g/dL — ABNORMAL LOW (ref 3.5–5.0)
Alkaline Phosphatase: 63 U/L (ref 38–126)
Bilirubin, Direct: 0.7 mg/dL — ABNORMAL HIGH (ref 0.0–0.2)
Indirect Bilirubin: 1.3 mg/dL — ABNORMAL HIGH (ref 0.3–0.9)
Total Bilirubin: 2 mg/dL — ABNORMAL HIGH (ref 0.3–1.2)
Total Protein: 5.5 g/dL — ABNORMAL LOW (ref 6.5–8.1)

## 2022-01-03 LAB — TROPONIN I (HIGH SENSITIVITY): Troponin I (High Sensitivity): 123 ng/L (ref <18)

## 2022-01-03 LAB — OSMOLALITY: Osmolality: 258 mOsm/kg — ABNORMAL LOW (ref 275–295)

## 2022-01-03 LAB — APTT: aPTT: 39 seconds — ABNORMAL HIGH (ref 24–36)

## 2022-01-03 LAB — LACTIC ACID, PLASMA: Lactic Acid, Venous: 4.5 mmol/L (ref 0.5–1.9)

## 2022-01-03 LAB — HEPARIN LEVEL (UNFRACTIONATED): Heparin Unfractionated: >1.1 IU/mL — ABNORMAL HIGH (ref 0.30–0.70)

## 2022-01-03 LAB — PROCALCITONIN: Procalcitonin: 4.26 ng/mL

## 2022-01-03 LAB — GLUCOSE, CAPILLARY
Glucose-Capillary: 123 mg/dL — ABNORMAL HIGH (ref 70–99)
Glucose-Capillary: 166 mg/dL — ABNORMAL HIGH (ref 70–99)

## 2022-01-03 LAB — STREP PNEUMONIAE URINARY ANTIGEN: Strep Pneumo Urinary Antigen: NEGATIVE

## 2022-01-03 LAB — MAGNESIUM: Magnesium: 1.9 mg/dL (ref 1.7–2.4)

## 2022-01-03 MED ORDER — PROPOFOL 1000 MG/100ML IV EMUL
0.0000 ug/kg/min | INTRAVENOUS | Status: DC
  Administered 2022-01-03: 5 ug/kg/min via INTRAVENOUS
  Administered 2022-01-04: 10 ug/kg/min via INTRAVENOUS
  Administered 2022-01-04: 20 ug/kg/min via INTRAVENOUS
  Filled 2022-01-03 (×4): qty 100

## 2022-01-03 MED ORDER — DOCUSATE SODIUM 50 MG/5ML PO LIQD
100.0000 mg | Freq: Two times a day (BID) | ORAL | Status: DC
  Administered 2022-01-03 – 2022-01-05 (×4): 100 mg
  Filled 2022-01-03 (×4): qty 10

## 2022-01-03 MED ORDER — INSULIN ASPART 100 UNIT/ML IJ SOLN
0.0000 [IU] | INTRAMUSCULAR | Status: DC
  Administered 2022-01-03: 1 [IU] via SUBCUTANEOUS
  Administered 2022-01-03: 2 [IU] via SUBCUTANEOUS
  Administered 2022-01-04: 1 [IU] via SUBCUTANEOUS
  Administered 2022-01-04: 3 [IU] via SUBCUTANEOUS
  Administered 2022-01-04: 1 [IU] via SUBCUTANEOUS
  Administered 2022-01-05: 3 [IU] via SUBCUTANEOUS
  Administered 2022-01-05 (×4): 2 [IU] via SUBCUTANEOUS
  Administered 2022-01-06 – 2022-01-07 (×2): 1 [IU] via SUBCUTANEOUS
  Administered 2022-01-08 – 2022-01-09 (×3): 2 [IU] via SUBCUTANEOUS
  Administered 2022-01-09 – 2022-01-10 (×2): 1 [IU] via SUBCUTANEOUS
  Administered 2022-01-11: 3 [IU] via SUBCUTANEOUS
  Administered 2022-01-11: 5 [IU] via SUBCUTANEOUS
  Administered 2022-01-12: 2 [IU] via SUBCUTANEOUS
  Administered 2022-01-12: 3 [IU] via SUBCUTANEOUS
  Administered 2022-01-12: 2 [IU] via SUBCUTANEOUS
  Administered 2022-01-12: 5 [IU] via SUBCUTANEOUS
  Administered 2022-01-12 – 2022-01-14 (×2): 2 [IU] via SUBCUTANEOUS
  Administered 2022-01-14 (×2): 1 [IU] via SUBCUTANEOUS

## 2022-01-03 MED ORDER — VANCOMYCIN HCL IN DEXTROSE 1-5 GM/200ML-% IV SOLN
1000.0000 mg | INTRAVENOUS | Status: DC
  Administered 2022-01-04: 1000 mg via INTRAVENOUS
  Filled 2022-01-03: qty 200

## 2022-01-03 MED ORDER — BUDESONIDE 0.25 MG/2ML IN SUSP
0.2500 mg | Freq: Two times a day (BID) | RESPIRATORY_TRACT | Status: DC
  Administered 2022-01-03 – 2022-02-11 (×78): 0.25 mg via RESPIRATORY_TRACT
  Filled 2022-01-03 (×80): qty 2

## 2022-01-03 MED ORDER — FENTANYL 2500MCG IN NS 250ML (10MCG/ML) PREMIX INFUSION
25.0000 ug/h | INTRAVENOUS | Status: DC
  Administered 2022-01-03: 100 ug/h via INTRAVENOUS
  Administered 2022-01-04: 50 ug/h via INTRAVENOUS
  Filled 2022-01-03 (×2): qty 250

## 2022-01-03 MED ORDER — METHYLPREDNISOLONE SODIUM SUCC 40 MG IJ SOLR
40.0000 mg | Freq: Two times a day (BID) | INTRAMUSCULAR | Status: DC
  Administered 2022-01-03 – 2022-01-05 (×4): 40 mg via INTRAVENOUS
  Filled 2022-01-03 (×3): qty 1

## 2022-01-03 MED ORDER — FENTANYL BOLUS VIA INFUSION
25.0000 ug | INTRAVENOUS | Status: DC | PRN
  Administered 2022-01-05: 25 ug via INTRAVENOUS

## 2022-01-03 MED ORDER — ORAL CARE MOUTH RINSE
15.0000 mL | OROMUCOSAL | Status: DC
  Administered 2022-01-03 – 2022-01-06 (×30): 15 mL via OROMUCOSAL

## 2022-01-03 MED ORDER — PANTOPRAZOLE SODIUM 40 MG IV SOLR
40.0000 mg | Freq: Every day | INTRAVENOUS | Status: DC
  Administered 2022-01-03 – 2022-01-06 (×4): 40 mg via INTRAVENOUS
  Filled 2022-01-03 (×4): qty 10

## 2022-01-03 MED ORDER — REVEFENACIN 175 MCG/3ML IN SOLN
175.0000 ug | Freq: Every day | RESPIRATORY_TRACT | Status: DC
  Administered 2022-01-04 – 2022-02-11 (×37): 175 ug via RESPIRATORY_TRACT
  Filled 2022-01-03 (×40): qty 3

## 2022-01-03 MED ORDER — VANCOMYCIN HCL 1250 MG/250ML IV SOLN
1250.0000 mg | Freq: Once | INTRAVENOUS | Status: AC
  Administered 2022-01-03: 1250 mg via INTRAVENOUS
  Filled 2022-01-03: qty 250

## 2022-01-03 MED ORDER — MIDAZOLAM HCL 2 MG/2ML IJ SOLN
2.0000 mg | Freq: Once | INTRAMUSCULAR | Status: DC
  Filled 2022-01-03: qty 2

## 2022-01-03 MED ORDER — ARFORMOTEROL TARTRATE 15 MCG/2ML IN NEBU
15.0000 ug | INHALATION_SOLUTION | Freq: Two times a day (BID) | RESPIRATORY_TRACT | Status: DC
  Administered 2022-01-03 – 2022-02-11 (×77): 15 ug via RESPIRATORY_TRACT
  Filled 2022-01-03 (×75): qty 2

## 2022-01-03 MED ORDER — FENTANYL CITRATE PF 50 MCG/ML IJ SOSY
100.0000 ug | PREFILLED_SYRINGE | Freq: Once | INTRAMUSCULAR | Status: AC
  Administered 2022-01-03: 100 ug via INTRAVENOUS
  Filled 2022-01-03: qty 2

## 2022-01-03 MED ORDER — FENTANYL CITRATE PF 50 MCG/ML IJ SOSY: 25.0000 ug | PREFILLED_SYRINGE | Freq: Once | INTRAMUSCULAR | Status: DC

## 2022-01-03 MED ORDER — METHYLPREDNISOLONE SODIUM SUCC 40 MG IJ SOLR
40.0000 mg | Freq: Two times a day (BID) | INTRAMUSCULAR | Status: DC
  Filled 2022-01-03: qty 1

## 2022-01-03 MED ORDER — VASOPRESSIN 20 UNITS/100 ML INFUSION FOR SHOCK: 0.0400 [IU]/min | INTRAVENOUS | Status: DC

## 2022-01-03 MED ORDER — POTASSIUM CHLORIDE 20 MEQ PO PACK
40.0000 meq | PACK | Freq: Once | ORAL | Status: AC
  Administered 2022-01-03: 40 meq
  Filled 2022-01-03: qty 2

## 2022-01-03 MED ORDER — CEFEPIME HCL 2 G IV SOLR
2.0000 g | Freq: Two times a day (BID) | INTRAVENOUS | Status: AC
  Administered 2022-01-04 – 2022-01-08 (×11): 2 g via INTRAVENOUS
  Filled 2022-01-03 (×11): qty 12.5

## 2022-01-03 MED ORDER — POLYETHYLENE GLYCOL 3350 17 G PO PACK
17.0000 g | PACK | Freq: Every day | ORAL | Status: DC
  Administered 2022-01-04 – 2022-01-05 (×2): 17 g
  Filled 2022-01-03 (×3): qty 1

## 2022-01-03 MED ORDER — NOREPINEPHRINE 4 MG/250ML-% IV SOLN
0.0000 ug/min | INTRAVENOUS | Status: DC
  Administered 2022-01-03: 20 ug/min via INTRAVENOUS
  Administered 2022-01-03: 8 ug/min via INTRAVENOUS
  Administered 2022-01-04: 5 ug/min via INTRAVENOUS
  Filled 2022-01-03 (×4): qty 250

## 2022-01-03 MED ORDER — POTASSIUM CHLORIDE 10 MEQ/50ML IV SOLN
10.0000 meq | INTRAVENOUS | Status: AC
  Administered 2022-01-03 – 2022-01-04 (×5): 10 meq via INTRAVENOUS
  Filled 2022-01-03 (×5): qty 50

## 2022-01-03 MED ORDER — ORAL CARE MOUTH RINSE: 15.0000 mL | OROMUCOSAL | Status: DC | PRN

## 2022-01-03 NOTE — Anesthesia Procedure Notes (Addendum)
Procedure Name: Intubation Date/Time: 01/03/2022 4:51 PM  Performed by: Trinna Post., CRNAPre-anesthesia Checklist: Patient identified, Emergency Drugs available, Suction available, Patient being monitored and Timeout performed Patient Re-evaluated:Patient Re-evaluated prior to induction Oxygen Delivery Method: Ambu bag Preoxygenation: Pre-oxygenation with 100% oxygen Ventilation: Mask ventilation without difficulty Laryngoscope Size: Glidescope and 3 Grade View: Grade I Tube type: Oral Tube size: 7.5 mm Number of attempts: 1 Airway Equipment and Method: Rigid stylet and Video-laryngoscopy Placement Confirmation: ETT inserted through vocal cords under direct vision, positive ETCO2 and breath sounds checked- equal and bilateral Secured at: 22 cm Tube secured with: Tape Dental Injury: Teeth and Oropharynx as per pre-operative assessment

## 2022-01-03 NOTE — Procedures (Signed)
Arterial Catheter Insertion Procedure Note  ANNAGRACE CARR  [id number]  1941-05-13  Date:01/03/22  Time:5:32 PM    Provider Performing: Candee Furbish    Procedure: Insertion of Arterial Line [phone]) with US guidance ([id number])   Indication(s) Blood pressure monitoring and/or need for frequent ABGs  Consent Unable to obtain consent due to emergent nature of procedure.  Anesthesia None   Time Out Verified patient identification, verified procedure, site/side was marked, verified correct patient position, special equipment/implants available, medications/allergies/relevant history reviewed, required imaging and test results available.   Sterile Technique Maximal sterile technique including full sterile barrier drape, hand hygiene, sterile gown, sterile gloves, mask, hair covering, sterile ultrasound probe cover (if used).   Procedure Description Area of catheter insertion was cleaned with chlorhexidine and draped in sterile fashion. With real-time ultrasound guidance an arterial catheter was placed into the right radial artery.  Appropriate arterial tracings confirmed on monitor.     Complications/Tolerance None; patient tolerated the procedure well.   EBL Minimal   Specimen(s) None

## 2022-01-03 NOTE — Progress Notes (Signed)
Progress Note  Patient Name: [name] Date of Encounter: 01/03/2022  [facility] Cardiologist: Kirk Ruths, MD   Subjective   Sitting on edge of bed.  Still short of breath.  Inpatient Medications    Scheduled Meds:  fluticasone furoate-vilanterol  1 puff Inhalation Daily   furosemide  40 mg Intravenous BID   guaiFENesin  600 mg Oral BID   ipratropium-albuterol  3 mL Nebulization Q6H   irbesartan  150 mg Oral Daily   letrozole  2.5 mg Oral Daily   propranolol ER  80 mg Oral Daily   rivaroxaban  20 mg Oral Q supper   sodium chloride HYPERTONIC  4 mL Nebulization BID   venlafaxine XR  75 mg Oral Q breakfast   Continuous Infusions:  azithromycin (ZITHROMAX) 500 mg in sodium chloride 0.9 % 250 mL IVPB Stopped (01/02/22 2224)   cefTRIAXone (ROCEPHIN)  IV Stopped (01/02/22 2044)   PRN Meds: acetaminophen, famotidine, guaiFENesin-dextromethorphan, melatonin, metoprolol tartrate, nitroGLYCERIN, polyethylene glycol, prochlorperazine   Vital Signs    Vitals:   01/03/22 0724 01/03/22 0727 01/03/22 0729 01/03/22 0821  BP:    (!) 133/99  Pulse:    99  Resp:    18  Temp:    98.3 F (36.8 C)  TempSrc:    Oral  SpO2: 94% 95% 97% 96%  Weight:      Height:        Intake/Output Summary (Last 24 hours) at 01/03/2022 1041 Last data filed at 01/03/2022 0915 Gross per 24 hour  Intake 880.03 ml  Output 1600 ml  Net -719.97 ml      01/03/2022    6:30 AM 01/02/2022    4:55 AM 01/02/2022   11:13 PM  Last 3 Weights  Weight (lbs) 143 lb 11.8 oz 140 lb 10.5 oz 140 lb 10.5 oz  Weight (kg) 65.2 kg 63.8 kg 63.8 kg      Telemetry    Atrial fibrillation heart rate under good control less than 100.- Personally Reviewed  ECG    No new- Personally Reviewed  Physical Exam   GEN: No acute distress.   Neck: No JVD Cardiac: Irregularly irregular, no murmurs, rubs, or gallops.  Respiratory: Mild crackles at bases. GI: Soft, nontender, non-distended  MS: No edema; No  deformity. Neuro:  Nonfocal  Psych: Normal affect   Labs    High Sensitivity Troponin:   Recent Labs  Lab 12/31/2021 1813 12/14/2021 2151 01/02/22 1023 01/02/22 1308  TROPONINIHS '11 15 8 11     '$ Chemistry Recent Labs  Lab 12/14/2021 1813 12/08/2021 1819 01/02/22 0029 01/03/22 0735  NA 132* 130* 130* 120*  K 3.9 3.8 3.4* 3.7  CL 95*  --  93* 82*  CO2 23  --  25 24  GLUCOSE 120*  --  119* 110*  BUN 8  --  8 13  CREATININE 0.63  --  0.73 0.62  CALCIUM 9.4  --  8.8* 9.5  MG  --   --  1.0* 1.9  PROT 7.6  --  6.6  --   ALBUMIN 3.7  --  3.0*  --   AST 50*  --  36  --   ALT 21  --  18  --   ALKPHOS 131*  --  95  --   BILITOT 1.4*  --  1.4*  --   GFRNONAA >60  --  >60 >60  ANIONGAP 14  --  12 14    Lipids No results for  input(s): "CHOL", "TRIG", "HDL", "LABVLDL", "LDLCALC", "CHOLHDL" in the last 168 hours.  Hematology Recent Labs  Lab 12/14/2021 1813 01/04/2022 1819 12/15/2021 2249  WBC 9.5  --  9.5  RBC 4.17  --  4.05  HGB 15.1* 16.3* 14.7  HCT 44.3 48.0* 41.9  MCV 106.2*  --  103.5*  MCH 36.2*  --  36.3*  MCHC 34.1  --  35.1  RDW 13.2  --  13.2  PLT 190  --  153   Thyroid  Recent Labs  Lab 01/02/22 1721  TSH 3.892    BNP Recent Labs  Lab 12/16/2021 1813 12/29/2021 2249  BNP 311.6* 479.9*    DDimer  Recent Labs  Lab 01/02/22 1308  DDIMER 1.11*     Radiology    CT Angio Chest Pulmonary Embolism (PE) W or WO Contrast  Result Date: 01/02/2022 CLINICAL DATA:  Chest pain and shortness of breath. EXAM: CT ANGIOGRAPHY CHEST WITH CONTRAST TECHNIQUE: Multidetector CT imaging of the chest was performed using the standard protocol during bolus administration of intravenous contrast. Multiplanar CT image reconstructions and MIPs were obtained to evaluate the vascular anatomy. RADIATION DOSE REDUCTION: This exam was performed according to the departmental dose-optimization program which includes automated exposure control, adjustment of the mA and/or kV according to patient  size and/or use of iterative reconstruction technique. CONTRAST:  130m OMNIPAQUE IOHEXOL 350 MG/ML SOLN COMPARISON:  Chest radiograph dated 01/02/2022. FINDINGS: Cardiovascular: Mild cardiomegaly with biatrial enlargement. Retrograde flow of the contrast from the right atrium into the IVC in keeping with right heart dysfunction. Correlation with echocardiogram recommended. No pericardial effusion. Coronary vascular calcification of the LAD. There is mild atherosclerotic calcification of the thoracic aorta. No aneurysmal dilatation. Evaluation of the pulmonary arteries is limited due to respiratory motion. No pulmonary artery embolus identified. Mediastinum/Nodes: No adenopathy in the left hilum. Mediastinal lymph node measures approximately 9 mm in short axis anterior to the carina. Evaluation of the right hilar lymph nodes is limited due to consolidative changes of the right lung. The esophagus is grossly unremarkable. No mediastinal fluid collection. Lungs/Pleura: There is a small right pleural effusion. Large areas of consolidation with air bronchogram involving the right lower and right middle lobes consistent with atelectasis or infiltrate. Underlying mass is not excluded. Clinical correlation and follow-up to resolution recommended. Minimal left lung base atelectasis. No pneumothorax. The central airways are patent. 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 Upper Abdomen: Cirrhosis. Musculoskeletal: Osteopenia degenerative changes of the spine. No acute osseous pathology. Bilateral breast implants. Review of the MIP images confirms the above findings. IMPRESSION: 1. No CT evidence of pulmonary artery embolus. 2. Cardiomegaly with biatrial enlargement and evidence of right heart dysfunction. Correlation with echocardiogram recommended. 3. Large areas of consolidation with air bronchogram involving the right lower and right middle lobes consistent with atelectasis or infiltrate. Underlying mass is not excluded. Clinical  correlation and follow-up to resolution recommended. 4. Small right pleural effusion. 5. Tracheomalacia. 6. Aortic Atherosclerosis (ICD10-I70.0). Electronically Signed   By: AAnner CreteM.D.   On: 01/02/2022 23:16   DG CHEST PORT 1 VIEW  Result Date: 01/02/2022 CLINICAL DATA:  Shortness of breath EXAM: PORTABLE CHEST 1 VIEW COMPARISON:  Chest x-ray 12/28/2021 FINDINGS: There is a new moderate-sized right pleural effusion. There is new focal density in the right perihilar and right suprahilar region. Left lung is clear. No pneumothorax. Cardiomediastinal silhouette is grossly unchanged. Surgical clips overlie the right lower chest and right axilla similar to the prior study. No acute fractures are  seen. IMPRESSION: 1. New moderate-sized right pleural effusion. 2. New right perihilar focal opacities. Findings may represent pneumonia, but underlying neoplastic process not excluded. Please correlate clinically. Follow-up chest CT recommended. Electronically Signed   By: Ronney Asters M.D.   On: 01/02/2022 17:10   DG Chest Portable 1 View  Result Date: 12/06/2021 CLINICAL DATA:  Shortness of breath EXAM: PORTABLE CHEST 1 VIEW COMPARISON:  Previous studies including the examination of 12/28/2020 FINDINGS: Transverse diameter of heart is within normal limits. There are no signs of pulmonary edema. There is patchy infiltrate in medial right mid and right lower lung fields. Right hemidiaphragm is elevated. There is minimal blunting of right lateral CP angle. There is no pneumothorax. IMPRESSION: There is patchy infiltrate in the medial right mid and right lower lung fields suggesting atelectasis/pneumonia. Possible small right pleural effusion. Electronically Signed   By: Elmer Picker M.D.   On: 12/15/2021 18:37    Cardiac Studies   EF 55%, estimated pulmonary pressures 50 mmHg  Patient Profile     81 y.o. female acute on chronic diastolic heart failure hypokalemia hypomagnesemia  community-acquired pneumonia debility  Assessment & Plan    Acute on chronic diastolic heart failure - Continue with IV Lasix 40 mg twice a day.  1.5 L out yesterday creatinine 0.6.  Excellent.  Breathing still has room to go.  She believes that her weight was increased.  Hypokalemia - Continue with potassium supplementation while on IV Lasix.  Monitor.  Hypomagnesemia - Repleted magnesium via IV.  Community-acquired pneumonia - Per primary team.  Lung exam still crackly.  Antibiotics.  Permanent atrial fibrillation - Under good rate control with propranolol.  Chronic anticoagulation - On Xarelto.  No bleeding.  Debility - She does state that she is having more trouble with disequilibrium, getting dressed.  She is also noted that she has some trouble articulating her words prior to admission.  Physical/occupational therapy may be helpful.    For questions or updates, please contact [place] Please consult [email or web address] for contact info under        Signed, Candee Furbish, MD  01/03/2022, 10:41 AM

## 2022-01-03 NOTE — Code Documentation (Signed)
  Patient Name: [name]   MRN: [id number]   Date of Birth/ Sex: February 04, 1941 , female      Admission Date: 12/31/2021  Attending Provider: Bonnell Public, MD  Primary Diagnosis: CAP (community acquired pneumonia)   Indication: Pt was in her usual state of health until this PM, when she was noted to be unresponsive. Code blue was subsequently called. At the time of arrival on scene, ACLS protocol was underway.   Technical Description:  - CPR performance duration:  7 minutes  - Was defibrillation or cardioversion used? No   - Was external pacer placed? No  - Was patient intubated pre/post CPR? Yes   Medications Administered: Y = Yes; Blank = No Amiodarone    Atropine    Calcium    Epinephrine  Y  Lidocaine    Magnesium    Norepinephrine    Phenylephrine    Sodium bicarbonate  Y  Vasopressin     Post CPR evaluation:  - Final Status - Was patient successfully resuscitated ? Yes - What is current rhythm? Sinus tachycardia - What is current hemodynamic status? ROSC achieved  Miscellaneous Information:  - Labs sent, including: None, PCCM to address  - Primary team notified?  Yes  - Family Notified? RN called  - Additional notes/ transfer status: Transfer to [id number]     Linward Natal, MD  01/03/2022, 5:04 PM

## 2022-01-03 NOTE — Progress Notes (Signed)
PROGRESS NOTE    [name]  JKK:[id number] DOB: [birth year] DOA: 01/02/2022 PCP: [facility] [place]  Outpatient Specialists:     Brief Narrative:  As per H&P done on admission: " [name] is a 81 y.o. female with medical history significant for persistent atrial fibrillation on Xarelto, asthma, essential hypertension, chronic diastolic CHF, right breast cancer on letrozole, history of syncope, generalized anxiety disorder, moderate pulmonary hypertension, who presented to [facility] ED from home via EMS due to progressively worsening shortness of breath.  Associated with a productive cough and right-sided chest pain worse with inspiration of 3 days duration.  Her right-sided chest pain worsened today and she decided to come to the ED for further evaluation.     On EMS arrival the patient was noted to be hypoxic with O2 saturation of 85% on room air.  She received nebulizer treatment in route and was placed on nonrebreather.  Endorses subjective fevers.  While in the ED she is noted to be febrile with a Tmax of 103.  Right lower lobe infiltrate seen on chest x-ray, suggestive of right-sided pneumonia.  High-sensitivity troponin negative.  BNP greater than 300 but euvolemic on exam.  Placed on 4 L nasal cannula with O2 saturation in the mid 90s with uncontrolled hypertension, A-fib RVR.  Started on Rocephin and azithromycin empirically.  TRH, hospitalist service, was asked to admit.   ED Course: Tmax 103.  BP 146/92, pulse 112, respiration rate 28, O2 saturation 93% on 4 L.  Labs studies remarkable for serum sodium 130, alkaline phosphatase 131, AST 50, total bilirubin 1.4, BNP 311, high-sensitivity troponin 11.  CBC with hemoglobin of 16.3.  MCV 106.2".  01/02/2022: Patient seen alongside patient's nurse.  Patient continues to report shortness of breath with minimal exertion.  Patient also reports associated chest tightness with activity.  Troponins have been negative.   Cardiac BNP was mildly elevated.  Patient has COPD with exacerbation.  At that has been concern for possible pneumonia.  Cardiology team has been consulted.  We will repeat chest x-ray.  We will diurese patient.  Check procalcitonin.  Continue antibiotics for now.  01/03/2022: Patient seen alongside patient's husband and nurse.  There are concerns the patient could be confused.  Husband wants CT head done.  We will proceed with CT head without contrast.  Clear, current confusion is likely of organic etiology.  CT of the chest done yesterday revealed right middle and lower lobe consolidation/infiltrate, and mass cannot be excluded.  Continue antibiotics.  Aspiration precautions.  Cardiology input is appreciated.  Echocardiogram is still pending.  We will proceed with speech evaluation.  CTA chest reviewed: 1. No CT evidence of pulmonary artery embolus. 2. Cardiomegaly with biatrial enlargement and evidence of right heart dysfunction. Correlation with echocardiogram recommended. 3. Large areas of consolidation with air bronchogram involving the right lower and right middle lobes consistent with atelectasis or infiltrate. Underlying mass is not excluded. Clinical correlation and follow-up to resolution recommended. 4. Small right pleural effusion. 5. Tracheomalacia. 6. Aortic Atherosclerosis (ICD10-I70.0).   Assessment & Plan:   Principal Problem:   CAP (community acquired pneumonia)   Sepsis secondary to community-acquired pneumonia, POA Presented with complaints of shortness of breath with minimal exertion, productive cough, right-sided pleuritic chest pain worse with taking a deep breath. Febrile on presentation with Tmax 103, pulse 116, respiration rate 33. Personally reviewed chest x-ray on admission which shows right lower lobe infiltrates, suggestive of pneumonia. Started on Rocephin and  azithromycin in the ED, continue Obtain baseline procalcitonin and sputum culture. Urine antigen  strep pneumoniae and urine antigen Legionella Follow blood cultures and urine culture Narrow down antibiotics as able. DuoNeb every 6 hours Hypersaline nebs twice daily x3 days Mucinex 600 mg twice daily x3 days. Antitussives as needed 01/02/2022: Continue to assess patient.  Follow work-up. 01/03/2022: See above documentation.  Speech evaluation.  Sepsis physiology has resolved.  Treated for possible aspiration pneumonia.   Acute hypoxic respiratory failure secondary to community-acquired pneumonia Not on oxygen supplementation at baseline Currently requiring 4 L to maintain O2 saturation greater than 90%. Wean off oxygen supplementation as tolerated Continue to treat underlying condition Incentive spirometer Mobilize as tolerated. 01/02/2022: Patient continues to wheeze.  Continue treatment for COPD with exacerbation.  Patient also reports dyspnea with minimal activity.  Gentle diuresis.  Cardiac BNP is elevated.  D-dimer is elevated.  We will proceed with CTA chest to rule out PE.  Prior history of moderate pulmonary hypertension.  We will repeat echocardiogram.  Hopefully, the dyspnea on exertion is not secondary to severe pulmonary hypertension.  We will have low threshold to consult the pulmonary team as well.  Guarded prognosis. 01/03/2022: Acute respiratory failure is likely multifactorial.  Prior history of moderate pulmonary hypertension documented.  Patient has baseline pulmonary disease.  Recent pneumonias noted.   Right-sided chest pain, suspect musculoskeletal High-sensitivity troponin negative Obtain twelve-lead EKG Closely monitor on telemetry If symptoms persist consider 2D echo. 01/02/2022: Follow CTA chest.  Follow echocardiogram. 01/03/2022: CTA chest is negative for pulmonary is negative for acute PE.  Right-sided pneumonia is reported.   History of right breast cancer on letrozole, immunocompromised state Resume home regimen Continue to treat underlying active infective  process.   Persistent atrial fibrillation on Xarelto Resume home regimen, Xarelto She is on home propranolol for rate control Monitor on telemetry   Essential hypertension, BP is not controlled, elevated Resume home oral antihypertensive IV Lopressor as needed with parameters. Closely monitor vital signs.   Chronic anxiety/depression Resume home regimen.   GERD Resume home regimen.   Chronic diastolic CHF Last 2D echo done on 12/29/2020 showed LVEF 55% Euvolemic on exam Monitor strict I's and O's and daily weight Hold off diuretics for now in the setting of sepsis. Resume home irbesartan. 01/02/2022: BNP is elevated.  Start patient on IV Lasix.   Pulmonary hypertension: -Patient has history of moderate pulmonary hypertension. -Repeat echocardiogram. -Hopefully, worsening shortness of breath is not secondary to severe pulmonary hypertension.  Elevated D-dimer: -CTA chest is negative for PE.     DVT prophylaxis: Xarelto Code Status: Full code Family Communication:  Disposition Plan: This will depend on hospital course   Consultants:  Cardiology  Procedures:  None.  Antimicrobials:  IV ceftriaxone IV azithromycin.     Subjective: Dyspnea on minimal exertion.  Pleuritic chest tightness with activity.  Objective: Vitals:   01/03/22 0729 01/03/22 0821 01/03/22 1137 01/03/22 1418  BP:  (!) 133/99 (!) 161/100   Pulse:  99 100   Resp:  18 (!) 26   Temp:  98.3 F (36.8 C)    TempSrc:  Oral    SpO2: 97% 96% 95% 95%  Weight:      Height:        Intake/Output Summary (Last 24 hours) at 01/03/2022 1458 Last data filed at 01/03/2022 0915 Gross per 24 hour  Intake 640.03 ml  Output 1300 ml  Net -659.97 ml    Filed Weights   12/21/2021 2313  01/02/22 0455 01/03/22 0630  Weight: 63.8 kg 63.8 kg 65.2 kg    Examination:  General exam: Appears calm and comfortable  Respiratory system: Decreased air entry with expiratory wheeze. Cardiovascular system: S1 & S2,  irregular.. Gastrointestinal system: Abdomen is nondistended, soft and nontender. No organomegaly or masses felt. Normal bowel sounds heard. Central nervous system: Awake and alert.  Patient moves all extremities.     Data Reviewed: I have personally reviewed following labs and imaging studies  CBC: Recent Labs  Lab 12/19/2021 1813 12/11/2021 1819 12/23/2021 2249  WBC 9.5  --  9.5  NEUTROABS 8.7*  --  8.3*  HGB 15.1* 16.3* 14.7  HCT 44.3 48.0* 41.9  MCV 106.2*  --  103.5*  PLT 190  --  283    Basic Metabolic Panel: Recent Labs  Lab 12/25/2021 1813 12/14/2021 1819 01/02/22 0029 01/03/22 0735  NA 132* 130* 130* 120*  K 3.9 3.8 3.4* 3.7  CL 95*  --  93* 82*  CO2 23  --  25 24  GLUCOSE 120*  --  119* 110*  BUN 8  --  8 13  CREATININE 0.63  --  0.73 0.62  CALCIUM 9.4  --  8.8* 9.5  MG  --   --  1.0* 1.9  PHOS  --   --  3.7  --     GFR: Estimated Creatinine Clearance: 48.9 mL/min (by C-G formula based on SCr of 0.62 mg/dL). Liver Function Tests: Recent Labs  Lab 12/20/2021 1813 01/02/22 0029  AST 50* 36  ALT 21 18  ALKPHOS 131* 95  BILITOT 1.4* 1.4*  PROT 7.6 6.6  ALBUMIN 3.7 3.0*    No results for input(s): "LIPASE", "AMYLASE" in the last 168 hours. No results for input(s): "AMMONIA" in the last 168 hours. Coagulation Profile: No results for input(s): "INR", "PROTIME" in the last 168 hours. Cardiac Enzymes: No results for input(s): "CKTOTAL", "CKMB", "CKMBINDEX", "TROPONINI" in the last 168 hours. BNP (last 3 results) No results for input(s): "PROBNP" in the last 8760 hours. HbA1C: No results for input(s): "HGBA1C" in the last 72 hours. CBG: No results for input(s): "GLUCAP" in the last 168 hours. Lipid Profile: No results for input(s): "CHOL", "HDL", "LDLCALC", "TRIG", "CHOLHDL", "LDLDIRECT" in the last 72 hours. Thyroid Function Tests: Recent Labs    01/02/22 1721  TSH 3.892   Anemia Panel: No results for input(s): "VITAMINB12", "FOLATE", "FERRITIN",  "TIBC", "IRON", "RETICCTPCT" in the last 72 hours. Urine analysis:    Component Value Date/Time   COLORURINE YELLOW 12/28/2020 1641   APPEARANCEUR HAZY (A) 12/28/2020 1641   LABSPEC 1.009 12/28/2020 1641   PHURINE 6.0 12/28/2020 1641   GLUCOSEU NEGATIVE 12/28/2020 1641   HGBUR NEGATIVE 12/28/2020 1641   [place] 12/28/2020 1641   KETONESUR NEGATIVE 12/28/2020 1641   PROTEINUR NEGATIVE 12/28/2020 1641   UROBILINOGEN 0.2 01/22/2013 1345   NITRITE NEGATIVE 12/28/2020 1641   LEUKOCYTESUR LARGE (A) 12/28/2020 1641   Sepsis Labs: '@LABRCNTIP'$ (procalcitonin:4,lacticidven:4)  ) Recent Results (from the past 240 hour(s))  MRSA Next Gen by PCR, Nasal     Status: None   Collection Time: 12/14/2021 10:32 PM  Result Value Ref Range Status   MRSA by PCR Next Gen NOT DETECTED NOT DETECTED Final    Comment: (NOTE) The GeneXpert MRSA Assay (FDA approved for NASAL specimens only), is one component of a comprehensive MRSA colonization surveillance program. It is not intended to diagnose MRSA infection nor to guide or monitor treatment for MRSA infections. Test  performance is not FDA approved in patients less than 46 years old. Performed at [facility], [place] [address], [place], [place] [address]   Culture, blood (Routine X 2) w Reflex to ID Panel     Status: None (Preliminary result)   Collection Time: 12/11/2021 10:49 PM   Specimen: BLOOD LEFT HAND  Result Value Ref Range Status   Specimen Description BLOOD LEFT HAND  Final   Special Requests   Final    AEROBIC BOTTLE ONLY Blood Culture results may not be optimal due to an inadequate volume of blood received in culture bottles   Culture   Final    NO GROWTH 2 DAYS Performed at [facility], [place] [address] [place], [place] [address]    Report Status PENDING  Incomplete  Culture, blood (Routine X 2) w Reflex to ID Panel     Status: None (Preliminary result)   Collection Time: 12/14/2021 10:49 PM   Specimen: BLOOD LEFT  HAND  Result Value Ref Range Status   Specimen Description BLOOD LEFT HAND  Final   Special Requests   Final    BOTTLES DRAWN AEROBIC AND ANAEROBIC Blood Culture adequate volume   Culture   Final    NO GROWTH 2 DAYS Performed at [facility], [place] [address], [place], [place] [address]    Report Status PENDING  Incomplete         Radiology Studies: CT Angio Chest Pulmonary Embolism (PE) W or WO Contrast  Result Date: 01/02/2022 CLINICAL DATA:  Chest pain and shortness of breath. EXAM: CT ANGIOGRAPHY CHEST WITH CONTRAST TECHNIQUE: Multidetector CT imaging of the chest was performed using the standard protocol during bolus administration of intravenous contrast. Multiplanar CT image reconstructions and MIPs were obtained to evaluate the vascular anatomy. RADIATION DOSE REDUCTION: This exam was performed according to the departmental dose-optimization program which includes automated exposure control, adjustment of the mA and/or kV according to patient size and/or use of iterative reconstruction technique. CONTRAST:  117m OMNIPAQUE IOHEXOL 350 MG/ML SOLN COMPARISON:  Chest radiograph dated 01/02/2022. FINDINGS: Cardiovascular: Mild cardiomegaly with biatrial enlargement. Retrograde flow of the contrast from the right atrium into the IVC in keeping with right heart dysfunction. Correlation with echocardiogram recommended. No pericardial effusion. Coronary vascular calcification of the LAD. There is mild atherosclerotic calcification of the thoracic aorta. No aneurysmal dilatation. Evaluation of the pulmonary arteries is limited due to respiratory motion. No pulmonary artery embolus identified. Mediastinum/Nodes: No adenopathy in the left hilum. Mediastinal lymph node measures approximately 9 mm in short axis anterior to the carina. Evaluation of the right hilar lymph nodes is limited due to consolidative changes of the right lung. The esophagus is grossly unremarkable. No mediastinal fluid  collection. Lungs/Pleura: There is a small right pleural effusion. Large areas of consolidation with air bronchogram involving the right lower and right middle lobes consistent with atelectasis or infiltrate. Underlying mass is not excluded. Clinical correlation and follow-up to resolution recommended. Minimal left lung base atelectasis. No pneumothorax. The central airways are patent. 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 Upper Abdomen: Cirrhosis. Musculoskeletal: Osteopenia degenerative changes of the spine. No acute osseous pathology. Bilateral breast implants. Review of the MIP images confirms the above findings. IMPRESSION: 1. No CT evidence of pulmonary artery embolus. 2. Cardiomegaly with biatrial enlargement and evidence of right heart dysfunction. Correlation with echocardiogram recommended. 3. Large areas of consolidation with air bronchogram involving the right lower and right middle lobes consistent with atelectasis or infiltrate. Underlying mass  is not excluded. Clinical correlation and follow-up to resolution recommended. 4. Small right pleural effusion. 5. Tracheomalacia. 6. Aortic Atherosclerosis (ICD10-I70.0). Electronically Signed   By: Anner Crete M.D.   On: 01/02/2022 23:16   DG CHEST PORT 1 VIEW  Result Date: 01/02/2022 CLINICAL DATA:  Shortness of breath EXAM: PORTABLE CHEST 1 VIEW COMPARISON:  Chest x-ray 12/19/2021 FINDINGS: There is a new moderate-sized right pleural effusion. There is new focal density in the right perihilar and right suprahilar region. Left lung is clear. No pneumothorax. Cardiomediastinal silhouette is grossly unchanged. Surgical clips overlie the right lower chest and right axilla similar to the prior study. No acute fractures are seen. IMPRESSION: 1. New moderate-sized right pleural effusion. 2. New right perihilar focal opacities. Findings may represent pneumonia, but underlying neoplastic process not excluded. Please correlate clinically. Follow-up chest CT recommended.  Electronically Signed   By: Ronney Asters M.D.   On: 01/02/2022 17:10   DG Chest Portable 1 View  Result Date: 12/24/2021 CLINICAL DATA:  Shortness of breath EXAM: PORTABLE CHEST 1 VIEW COMPARISON:  Previous studies including the examination of 12/28/2020 FINDINGS: Transverse diameter of heart is within normal limits. There are no signs of pulmonary edema. There is patchy infiltrate in medial right mid and right lower lung fields. Right hemidiaphragm is elevated. There is minimal blunting of right lateral CP angle. There is no pneumothorax. IMPRESSION: There is patchy infiltrate in the medial right mid and right lower lung fields suggesting atelectasis/pneumonia. Possible small right pleural effusion. Electronically Signed   By: Elmer Picker M.D.   On: 12/27/2021 18:37        Scheduled Meds:  budesonide (PULMICORT) nebulizer solution  0.25 mg Nebulization BID   fluticasone furoate-vilanterol  1 puff Inhalation Daily   furosemide  40 mg Intravenous BID   guaiFENesin  600 mg Oral BID   ipratropium-albuterol  3 mL Nebulization Q6H   irbesartan  150 mg Oral Daily   letrozole  2.5 mg Oral Daily   methylPREDNISolone (SOLU-MEDROL) injection  40 mg Intravenous Q12H   propranolol ER  80 mg Oral Daily   rivaroxaban  20 mg Oral Q supper   sodium chloride HYPERTONIC  4 mL Nebulization BID   venlafaxine XR  75 mg Oral Q breakfast   Continuous Infusions:  azithromycin (ZITHROMAX) 500 mg in sodium chloride 0.9 % 250 mL IVPB Stopped (01/02/22 2224)   cefTRIAXone (ROCEPHIN)  IV Stopped (01/02/22 2044)     LOS: 2 days    Time spent: 55 minutes    Dana Allan, MD  [facility] Pager #: [phone] 7PM-7AM contact night coverage as above

## 2022-01-03 NOTE — Progress Notes (Signed)
Pharmacy Antibiotic Note  [name] is a 81 y.o. female admitted on 12/15/2021 with pneumonia.  PMH of afib on Xarelto, HTN, CHF, and right breast cancer on letrozole.   Patient admitted for R sided pneumonia with current treatment of azithromycin and ceftriaxone. Patient has reported worsening SOB. Today was found pulseless and rapid response was called for code blue. ROSC achieved after 7 minutes of CPR.   Scr 0.62 at baseline. CrCl 48.9 ml/min. WBC 9.5 and afebrile. CXR from 7/29 shows new moderate sized right pleural effusion and new right sided focal opacities. Antibiotics escalated for concern of worsening pneumonia vs underlying neoplastic process. Pharmacy has been consulted for vancomycin and cefepime dosing.   Plan:  Start cefepime 2 g IV q12h Give vanc loading 1250 mg x1 Start vanc maintenance 1000 mg IV q24h  Obtain vancomycin peak and trough levels for goal AUC 400-600 Discontinue ceftriaxone  Height: 5' (152.4 cm) Weight: 65.2 kg (143 lb 11.8 oz) IBW/kg (Calculated) : 45.5  Temp (24hrs), Avg:98 F (36.7 C), Min:97.6 F (36.4 C), Max:98.3 F (36.8 C)  Recent Labs  Lab 01/02/2022 1813 12/16/2021 2249 01/02/22 0029 01/03/22 0735  WBC 9.5 9.5  --   --   CREATININE 0.63  --  0.73 0.62    Estimated Creatinine Clearance: 48.9 mL/min (by C-G formula based on SCr of 0.62 mg/dL).    Allergies  Allergen Reactions   Chlorhexidine Gluconate Hives, Swelling and Rash       "ChloraPrep "   Doxycycline Nausea And Vomiting   Codeine Nausea Only and Other (See Comments)    Pt can tolerate when pre-medicated   Doxycycline Nausea And Vomiting   Nsaids     avoid due to gerd   Nsaids Other (See Comments)    Gi bleed   Tylenol [Acetaminophen]     Avoid due to GERD   Adhesive [Tape] Rash    Dermabond   Chlorhexidine Swelling and Rash   Codeine Nausea Only   Tylenol [Acetaminophen] Other (See Comments)    GI bleed    Antimicrobials this admission: Vanc 7/30 >>   Cefepime 7/30 >>  Azithromycin 7/28 >> Ceftriaxone 7/28 >> 7/29  Dose adjustments this admission: Dose adjust vancomycin and cefepime   Microbiology results: 7/28 BCx: NGTD x2 7/29 UCx: sent   7/30 Sputum: sent   7/28 MRSA PCR: negative   Thank you for allowing pharmacy to be a part of this patient's care.    Gena Fray, PharmD PGY1 Pharmacy Resident   01/03/2022 7:11 PM

## 2022-01-03 NOTE — Progress Notes (Signed)
No sign effusion on bedside US, thin rim of fluid without safe window for pigtail.  R pupil now unreactive Moves and withdraws x 4  Stat head CT Add C/A/P w/o contrast while she is down there  ABG okay  Erskine Emery MD PCCM

## 2022-01-03 NOTE — Procedures (Signed)
Central Venous Catheter Insertion Procedure Note  SIEANNA VANSTONE  [id number]  [birth year]  Date:01/03/22  Time:5:31 PM   Provider Performing:Keny Donald Cipriano Mile   Procedure: Insertion of Non-tunneled Central Venous Catheter(36556) with US guidance ([id number])   Indication(s) Difficult access  Consent Unable to obtain consent due to emergent nature of procedure.  Anesthesia Topical only with 1% lidocaine   Timeout Verified patient identification, verified procedure, site/side was marked, verified correct patient position, special equipment/implants available, medications/allergies/relevant history reviewed, required imaging and test results available.  Sterile Technique Maximal sterile technique including full sterile barrier drape, hand hygiene, sterile gown, sterile gloves, mask, hair covering, sterile ultrasound probe cover (if used).  Procedure Description Area of catheter insertion was cleaned with chlorhexidine and draped in sterile fashion.  With real-time ultrasound guidance a central venous catheter was placed into the left internal jugular vein. Nonpulsatile blood flow and easy flushing noted in all ports.  The catheter was sutured in place and sterile dressing applied.  Complications/Tolerance None; patient tolerated the procedure well. Chest X-ray is ordered to verify placement for internal jugular or subclavian cannulation.   Chest x-ray is not ordered for femoral cannulation.  EBL Minimal  Specimen(s) None

## 2022-01-03 NOTE — Code Documentation (Signed)
RN states that the patient had by lying backwards in bed, with her head at the foot of the bed throughout the day. RN noticed that she had returned to this curious position and they were going to help her get back in bed normally and she was unresponsive. Code blue was subsequently called. CPR started by staff and code team arrived shortly afterwards. Epi x 2 given. ROSC achieved after 7 minutes of CPR. Patient was intubated by CRNA. Patient was transferred to [facility].

## 2022-01-03 NOTE — Progress Notes (Signed)
Patient was found laying upside in the bed for the second time, due to confusion. Another nurse, Jonelle Sidle and I went to help her turn around in the correct position in the bed. When we tried to assist her she was unresponsive. I tried to rouse her by shaking her shoulder then doing a sternum rub but was unable to rouse her. Jonelle Sidle pulled the code button on the wall to call a code blue. Jonelle Sidle retrieved the crash cart while I started CPR. Husband was notified by Jonelle Sidle.

## 2022-01-03 NOTE — Progress Notes (Signed)
  Transition of Care [facility]) Screening Note   Patient Details  Name: [name] Date of Birth: Mar 11, 1941   [facility]) CM/SW Contact:    Bary Castilla, LCSW Phone Number: 01/03/2022, 11:46 AM    Transition of Care Department [facility]) has reviewed patient and no TOC needs have been identified at this time. We will continue to monitor patient advancement through interdisciplinary progression rounds. If new patient transition needs arise, please place a TOC consult.

## 2022-01-03 NOTE — Progress Notes (Signed)
Pt transported from [facility] to [facility] on full vent support. No complications noted.

## 2022-01-03 NOTE — Progress Notes (Signed)
RT transported pt on ventilator from 2H14 to CT and back with no complications. Pt tolerated well, vitals stable throughout. RT, RN, and transportx2 at bedside. RT will continue to monitor.

## 2022-01-03 NOTE — Progress Notes (Signed)
[facility] Progress Note Patient Name: [name] DOB: [birth year] MRN: [id number]   Date of Service  01/03/2022  HPI/Events of Note  Potassium 2.6 Na 123 CT chest abd and pelvis.   Right consolidation/collapse and pleural effusion. Cirrhosis Distended GB with edema.  No ductal dilation Retroperitoneal edema of unclear cause  eICU Interventions  Replace potassium Serial BMP Will need chest tube tomorrow Follow abd exam.  Presently exam unremarkable     Intervention Category Major Interventions: Respiratory failure - evaluation and management Intermediate Interventions: Infection - evaluation and management Minor Interventions: Electrolytes abnormality - evaluation and management  Mauri Brooklyn, P 01/03/2022, 10:32 PM

## 2022-01-03 NOTE — Consult Note (Addendum)
NAME:  [name], MRN:  [id number], DOB:  1941-03-09, LOS: 2 ADMISSION DATE:  12/26/2021, CONSULTATION DATE:  01/03/22 REFERRING MD:  Graciella Freer, CHIEF COMPLAINT:  cardiac arrest   History of Present Illness:  81 year old woman w/ hx of Afib on xarelto who presented with R sided pleurisy, cough, fevers.  Found to have R sided pneumonia, new hypoxemia, afib/RVR, and admitted to progressive.  Also tx empirically for possible COPD exacerbation.  Today worsening SOB then found pulseless.  7 min CPR, PEA.  Some purposeful movement after arrest.  PCCM consulted.  Pertinent  Medical History  Breast ca on letrozole Afib on [facility] Events: Including procedures, antibiotic start and stop dates in addition to other pertinent events   7/28 admit 7/30 IHCA x 7 min  Interim History / Subjective:  Conslted  Objective   Blood pressure (!) 148/74, pulse (!) 106, temperature 98.3 F (36.8 C), temperature source Oral, resp. rate (!) 29, height 5' (1.524 m), weight 65.2 kg, SpO2 91 %.        Intake/Output Summary (Last 24 hours) at 01/03/2022 1737 Last data filed at 01/03/2022 0915 Gross per 24 hour  Intake 590.03 ml  Output 1300 ml  Net -709.97 ml   Filed Weights   12/26/2021 2313 01/02/22 0455 01/03/22 0630  Weight: 63.8 kg 63.8 kg 65.2 kg    Examination: General: Ill appearing mottled woman HENT: pupils dilated but reactive Lungs: diminished and rhonci on R, triggers vent Cardiovascular: tachycardic, bedside echo grossly preserved EF, dilated aortic root Abdomen: soft, hypoactive BS Extremities: cool to touch Neuro: moves purposefully and reaches for tube when BP is up Skin: mottled  Post arrest labs pending  Resolved Hospital Problem list   N/A  Assessment & Plan:  IHCA in context of R sided pneumonia and worsening resp symptoms.  PE doubtful as has been on [facility].  Suspect hypoxemia-induced.  Acute hyponatremia question ADH response in setting of worsening  sepsis  Post arrest profound shock  Post arrest encephalopathy  Post arrest acute hypoxemic respiratory failure  R PNA with parapneumonic effusion  - 2L LR, levophed/vaso to MAP 65 - Formal echo - Trend trop/lactate - EKG - Abx to vanc/cefepime/azithroymycin - Send urine Ag for strep/legionella - Bronch and possible R pigtail if good window found - Antihypertensives on hold - Fine to start heparin tomorrow - Called husband, left VM to call unit  Best Practice (right click and "Reselect all SmartList Selections" daily)   Diet/type: NPO DVT prophylaxis: DOAC GI prophylaxis: PPI Lines: Central line Foley:  Yes, and it is still needed Code Status:  full code Last date of multidisciplinary goals of care discussion [pending]  Labs   CBC: Recent Labs  Lab 12/30/2021 1813 12/20/2021 1819 12/31/2021 2249  WBC 9.5  --  9.5  NEUTROABS 8.7*  --  8.3*  HGB 15.1* 16.3* 14.7  HCT 44.3 48.0* 41.9  MCV 106.2*  --  103.5*  PLT 190  --  387    Basic Metabolic Panel: Recent Labs  Lab 12/13/2021 1813 12/27/2021 1819 01/02/22 0029 01/03/22 0735  NA 132* 130* 130* 120*  K 3.9 3.8 3.4* 3.7  CL 95*  --  93* 82*  CO2 23  --  25 24  GLUCOSE 120*  --  119* 110*  BUN 8  --  8 13  CREATININE 0.63  --  0.73 0.62  CALCIUM 9.4  --  8.8* 9.5  MG  --   --  1.0* 1.9  PHOS  --   --  3.7  --    GFR: Estimated Creatinine Clearance: 48.9 mL/min (by C-G formula based on SCr of 0.62 mg/dL). Recent Labs  Lab 12/11/2021 1813 12/31/2021 2249 01/02/22 0029  PROCALCITON  --   --  1.18  WBC 9.5 9.5  --     Liver Function Tests: Recent Labs  Lab 12/22/2021 1813 01/02/22 0029  AST 50* 36  ALT 21 18  ALKPHOS 131* 95  BILITOT 1.4* 1.4*  PROT 7.6 6.6  ALBUMIN 3.7 3.0*   No results for input(s): "LIPASE", "AMYLASE" in the last 168 hours. No results for input(s): "AMMONIA" in the last 168 hours.  ABG    Component Value Date/Time   HCO3 26.0 12/07/2021 1819   TCO2 27 12/13/2021 1819   O2SAT 58  12/24/2021 1819     Coagulation Profile: No results for input(s): "INR", "PROTIME" in the last 168 hours.  Cardiac Enzymes: No results for input(s): "CKTOTAL", "CKMB", "CKMBINDEX", "TROPONINI" in the last 168 hours.  HbA1C: No results found for: "HGBA1C"  CBG: No results for input(s): "GLUCAP" in the last 168 hours.  Review of Systems:   Intubated/sedated  Past Medical History:  She,  has a past medical history of Anemia, Anxiety, Arthritis, Asthma, Breast cancer ([place]), Cancer ([place]), Complication of anesthesia, Concussion, Dislocated elbow (10/12/2016), Family history of breast cancer, GERD (gastroesophageal reflux disease), History of kidney stones, History of radiation therapy (03/23/17-05/04/17), Hypertension, Hypertensive heart disease without CHF, Keratosis, actinic, Melanoma ([place]), Persistent atrial fibrillation ([place]) (12/16/2016), and Pneumonia.   Surgical History:   Past Surgical History:  Procedure Laterality Date   brachial skin removal due to deforimity Bilateral    BREAST ENHANCEMENT SURGERY Left 11/14/2018   Procedure: LEFT BREAST AUGMENTATION;  Surgeon: [name], MD;  Location: [facility];  Service: Plastics;  Laterality: Left;   BREAST RECONSTRUCTION Right 06/26/2018   BREAST RECONSTRUCTION WITH PLACEMENT OF TISSUE EXPANDER AND ALLODERM Right 06/26/2018   Procedure: RIGHT BREAST RECONSTRUCTION WITH PLACEMENT OF TISSUE EXPANDER;  Surgeon: [name], MD;  Location: [facility];  Service: Plastics;  Laterality: Right;   BREAST RECONSTRUCTION WITH PLACEMENT OF TISSUE EXPANDER AND FLEX HD (ACELLULAR HYDRATED DERMIS) Right 01/28/2017    RIGHT BREAST RECONSTRUCTION WITH PLACEMENT OF TISSUE EXPANDER AND ALLODERM;  Surgeon: [name], MD;  Location: [place] [facility];  Service: Plastics;  Laterality: Right;   BUNIONECTOMY     left    CATARACT EXTRACTION     bilaterla cataract surgery    COLONOSCOPY W/ POLYPECTOMY     JOINT REPLACEMENT     KNEE  ARTHROSCOPY     left    LATISSIMUS FLAP TO BREAST Right 06/26/2018   Procedure: LATISSIMUS FLAP TO RIGHT CHEST;  Surgeon: [name], MD;  Location: [facility];  Service: Plastics;  Laterality: Right;   MASTECTOMY Right    MASTECTOMY W/ SENTINEL NODE BIOPSY Right 01/28/2017   Procedure: RIGHT TOTAL MASTECTOMY WITH SENTINEL LYMPH NODE BIOPSY;  Surgeon: Excell Seltzer, MD;  Location: [facility];  Service: General;  Laterality: Right;   MASTOPEXY Left 11/14/2018   Procedure: LEFT BREAST MASTOPEXY;  Surgeon: [name], MD;  Location: [facility];  Service: Plastics;  Laterality: Left;   MELANOMA EXCISION Left    polyp removed from uterus      REMOVAL OF TISSUE EXPANDER AND PLACEMENT OF IMPLANT Right 11/14/2018   Procedure: REMOVAL OF RIGHT TISSUE EXPANDER AND PLACEMENT OF SALINE IMPLANT;  Surgeon: [name], MD;  Location: [facility];  Service: Plastics;  Laterality: Right;   right wrist pinning      skin cancers removed     TISSUE EXPANDER PLACEMENT Right 02/27/2018   Procedure: REMOVAL OF TISSUE EXPANDER;  Surgeon: [name], MD;  Location: [facility];  Service: Plastics;  Laterality: Right;   TOTAL KNEE ARTHROPLASTY Right 01/29/2013   Procedure: RIGHT TOTAL KNEE ARTHROPLASTY;  Surgeon: Gearlean Alf, MD;  Location: [facility];  Service: Orthopedics;  Laterality: Right;   TOTAL KNEE ARTHROPLASTY Left 08/29/2017   Procedure: LEFT TOTAL KNEE ARTHROPLASTY;  Surgeon: Gaynelle Arabian, MD;  Location: [facility];  Service: Orthopedics;  Laterality: Left;     Social History:   reports that she has never smoked. She has never used smokeless tobacco. She reports current alcohol use of about 1.0 standard drink of alcohol per week. She reports that she does not use drugs.   Family History:  Her family history includes Breast cancer in an other family member; COPD in her father; Hypertension in an other family member.   Allergies Allergies  Allergen Reactions    Chlorhexidine Gluconate Hives, Swelling and Rash       "ChloraPrep "   Doxycycline Nausea And Vomiting   Codeine Nausea Only and Other (See Comments)    Pt can tolerate when pre-medicated   Doxycycline Nausea And Vomiting   Nsaids     avoid due to gerd   Nsaids Other (See Comments)    Gi bleed   Tylenol [Acetaminophen]     Avoid due to GERD   Adhesive [Tape] Rash    Dermabond   Chlorhexidine Swelling and Rash   Codeine Nausea Only   Tylenol [Acetaminophen] Other (See Comments)    GI bleed     Home Medications  Prior to Admission medications   Medication Sig Start Date End Date Taking? Authorizing Provider  albuterol (VENTOLIN HFA) 108 (90 Base) MCG/ACT inhaler Inhale 1 puff into the lungs every 6 (six) hours as needed for wheezing or shortness of breath.   Yes [provider]  ALPRAZolam Duanne Moron) 0.5 MG tablet Take 0.5 mg by mouth daily as needed for anxiety.    Yes [provider]  B Complex Vitamins (VITAMIN B COMPLEX PO) Take 1 capsule by mouth daily.   Yes [provider]  BREO ELLIPTA 100-25 MCG/INH AEPB Inhale 1 puff into the lungs daily. 12/22/16  Yes [provider]  COLLAGEN PO Take 1 capsule by mouth daily.   Yes [provider]  desonide (DESOWEN) 0.05 % cream Apply 1 application topically 2 (two) times daily as needed (for skin irritation.).   Yes [provider]  diclofenac sodium (VOLTAREN) 1 % GEL Apply 2 g topically daily as needed (pain).    Yes [provider]  EPINEPHrine 0.3 mg/0.3 mL IJ SOAJ injection Inject 0.3 mg into the muscle as needed for anaphylaxis.   Yes [provider]  famotidine (PEPCID) 20 MG tablet Take 1 tablet by mouth as directed. 06/29/19  Yes [provider]  fluorouracil (EFUDEX) 5 % cream Apply 1 application  topically daily. Per MD instructions, for skin cancer 12/03/20  Yes [provider]  fluticasone (FLONASE) 50 MCG/ACT nasal spray Place 2 sprays into  both nostrils daily.   Yes [provider]  irbesartan (AVAPRO) 150 MG tablet Take 1 tablet (150 mg total) by mouth daily. 04/10/21  Yes Lelon Perla, MD  letrozole (FEMARA) 2.5 MG tablet TAKE 1 TABLET BY  MOUTH EVERY DAY 05/29/21  Yes Magrinat, Virgie Dad, MD  levocetirizine (XYZAL) 5 MG tablet Take 5 mg by mouth at bedtime. 09/04/20  Yes [provider]  Methylsulfonylmethane (MSM PO) Take 1 capsule by mouth daily.   Yes [provider]  MILK THISTLE PO Take 1 capsule by mouth daily.   Yes [provider]  Misc Natural Products (PYCNOGENOL COMPLEX PO) Take 1 capsule by mouth daily.   Yes [provider]  Omega-3 Fatty Acids (OMEGA 3 PO) Take 1 capsule by mouth daily.   Yes [provider]  OVER THE COUNTER MEDICATION Take 1 capsule by mouth daily. Immunity supplement   Yes [provider]  oxyCODONE (ROXICODONE) 5 MG immediate release tablet Take 1 tablet (5 mg total) by mouth every 4 (four) hours as needed for severe pain. 09/02/21  Yes Gareth Morgan, MD  Polyethyl Glycol-Propyl Glycol (SYSTANE OP) Place 1 drop into both eyes daily.   Yes [provider]  potassium chloride (KLOR-CON) 20 MEQ packet Take 20 mEq by mouth daily.   Yes [provider]  propranolol ER (INDERAL LA) 80 MG 24 hr capsule Take 80 mg by mouth daily.   Yes [provider]  TURMERIC PO Take 1 capsule by mouth daily.   Yes [provider]  venlafaxine XR (EFFEXOR-XR) 75 MG 24 hr capsule TAKE 1 CAPSULE BY MOUTH DAILY WITH BREAKFAST. 12/30/21  Yes Iruku, Arletha Pili, MD  VITAMIN D PO Take 1 capsule by mouth daily.   Yes [provider]  XARELTO 20 MG TABS tablet TAKE 1 TABLET BY MOUTH DAILY WITH SUPPER Patient taking differently: Take 20 mg by mouth daily. 09/29/21  Yes Vickie Epley, MD  furosemide (LASIX) 20 MG tablet Take 1 tablet (20 mg total) by mouth daily as needed. Patient not taking: Reported on 01/02/2022  11/18/21   Deberah Pelton, NP     Critical care time: 34 min independent of procedures

## 2022-01-03 NOTE — Procedures (Signed)
Bronchoscopy Procedure Note  [name]  [id number]  03/16/1941  Date:01/03/22  Time:6:06 PM   Provider Performing:Roanna Reaves C Tamala Julian   Procedure(s):  Flexible bronchoscopy with bronchial alveolar lavage (90300)  Indication(s) PNA  Consent Unable to obtain consent due to emergent nature of procedure.  Anesthesia In place for mechanical ventilation   Time Out Verified patient identification, verified procedure, site/side was marked, verified correct patient position, special equipment/implants available, medications/allergies/relevant history reviewed, required imaging and test results available.   Sterile Technique Usual hand hygiene, masks, gowns, and gloves were used   Procedure Description Bronchoscope advanced through endotracheal tube and into airway.  Airways were examined down to subsegmental level with findings noted below.   Following diagnostic evaluation, BAL(s) performed in RLL with normal saline and return of cloudy debris-filled fluid  Findings:  - ETT in good position - Thick bloody secretions on R - Dynamic airway collapse - Exaggerated bronchial striations c/w chronic bronchitis  Complications/Tolerance None; patient tolerated the procedure well. Chest X-ray is not needed post procedure.   EBL Minimal   Specimen(s) RLL BAL

## 2022-01-04 ENCOUNTER — Inpatient Hospital Stay (HOSPITAL_COMMUNITY): Payer: Medicare Other

## 2022-01-04 DIAGNOSIS — I469 Cardiac arrest, cause unspecified: Secondary | ICD-10-CM

## 2022-01-04 DIAGNOSIS — O85 Puerperal sepsis: Secondary | ICD-10-CM | POA: Diagnosis not present

## 2022-01-04 DIAGNOSIS — R6520 Severe sepsis without septic shock: Secondary | ICD-10-CM

## 2022-01-04 DIAGNOSIS — J90 Pleural effusion, not elsewhere classified: Secondary | ICD-10-CM

## 2022-01-04 DIAGNOSIS — J189 Pneumonia, unspecified organism: Secondary | ICD-10-CM | POA: Diagnosis not present

## 2022-01-04 DIAGNOSIS — J918 Pleural effusion in other conditions classified elsewhere: Secondary | ICD-10-CM

## 2022-01-04 DIAGNOSIS — I5021 Acute systolic (congestive) heart failure: Secondary | ICD-10-CM | POA: Diagnosis not present

## 2022-01-04 LAB — COMPREHENSIVE METABOLIC PANEL
ALT: 26 U/L (ref 0–44)
AST: 69 U/L — ABNORMAL HIGH (ref 15–41)
Albumin: 2.4 g/dL — ABNORMAL LOW (ref 3.5–5.0)
Alkaline Phosphatase: 64 U/L (ref 38–126)
Anion gap: 12 (ref 5–15)
BUN: 15 mg/dL (ref 8–23)
CO2: 25 mmol/L (ref 22–32)
Calcium: 9 mg/dL (ref 8.9–10.3)
Chloride: 90 mmol/L — ABNORMAL LOW (ref 98–111)
Creatinine, Ser: 0.73 mg/dL (ref 0.44–1.00)
GFR, Estimated: >60 mL/min (ref >60)
Glucose, Bld: 151 mg/dL — ABNORMAL HIGH (ref 70–99)
Potassium: 4.2 mmol/L (ref 3.5–5.1)
Sodium: 127 mmol/L — ABNORMAL LOW (ref 135–145)
Total Bilirubin: 2.1 mg/dL — ABNORMAL HIGH (ref 0.3–1.2)
Total Protein: 5.9 g/dL — ABNORMAL LOW (ref 6.5–8.1)

## 2022-01-04 LAB — PROTEIN, PLEURAL OR PERITONEAL FLUID: Total protein, fluid: <3 g/dL

## 2022-01-04 LAB — CBC
HCT: 34.7 % — ABNORMAL LOW (ref 36.0–46.0)
Hemoglobin: 12.8 g/dL (ref 12.0–15.0)
MCH: 36.8 pg — ABNORMAL HIGH (ref 26.0–34.0)
MCHC: 36.9 g/dL — ABNORMAL HIGH (ref 30.0–36.0)
MCV: 99.7 fL (ref 80.0–100.0)
Platelets: 146 10*3/uL — ABNORMAL LOW (ref 150–400)
RBC: 3.48 MIL/uL — ABNORMAL LOW (ref 3.87–5.11)
RDW: 12.4 % (ref 11.5–15.5)
WBC: 18.8 10*3/uL — ABNORMAL HIGH (ref 4.0–10.5)
nRBC: 0.2 % (ref 0.0–0.2)

## 2022-01-04 LAB — BASIC METABOLIC PANEL
Anion gap: 10 (ref 5–15)
Anion gap: 9 (ref 5–15)
Anion gap: 11 (ref 5–15)
Anion gap: 11 (ref 5–15)
BUN: 14 mg/dL (ref 8–23)
BUN: 13 mg/dL (ref 8–23)
BUN: 13 mg/dL (ref 8–23)
BUN: 15 mg/dL (ref 8–23)
CO2: 25 mmol/L (ref 22–32)
CO2: 25 mmol/L (ref 22–32)
CO2: 24 mmol/L (ref 22–32)
CO2: 25 mmol/L (ref 22–32)
Calcium: 9.1 mg/dL (ref 8.9–10.3)
Calcium: 9 mg/dL (ref 8.9–10.3)
Calcium: 9.2 mg/dL (ref 8.9–10.3)
Calcium: 9.1 mg/dL (ref 8.9–10.3)
Chloride: 92 mmol/L — ABNORMAL LOW (ref 98–111)
Chloride: 95 mmol/L — ABNORMAL LOW (ref 98–111)
Chloride: 93 mmol/L — ABNORMAL LOW (ref 98–111)
Chloride: 93 mmol/L — ABNORMAL LOW (ref 98–111)
Creatinine, Ser: 0.62 mg/dL (ref 0.44–1.00)
Creatinine, Ser: 0.66 mg/dL (ref 0.44–1.00)
Creatinine, Ser: 0.66 mg/dL (ref 0.44–1.00)
Creatinine, Ser: 0.67 mg/dL (ref 0.44–1.00)
GFR, Estimated: >60 mL/min (ref >60)
GFR, Estimated: >60 mL/min (ref >60)
GFR, Estimated: >60 mL/min (ref >60)
GFR, Estimated: >60 mL/min (ref >60)
Glucose, Bld: 142 mg/dL — ABNORMAL HIGH (ref 70–99)
Glucose, Bld: 140 mg/dL — ABNORMAL HIGH (ref 70–99)
Glucose, Bld: 147 mg/dL — ABNORMAL HIGH (ref 70–99)
Glucose, Bld: 143 mg/dL — ABNORMAL HIGH (ref 70–99)
Potassium: 4.8 mmol/L (ref 3.5–5.1)
Potassium: 4 mmol/L (ref 3.5–5.1)
Potassium: 3.6 mmol/L (ref 3.5–5.1)
Potassium: 3.4 mmol/L — ABNORMAL LOW (ref 3.5–5.1)
Sodium: 127 mmol/L — ABNORMAL LOW (ref 135–145)
Sodium: 129 mmol/L — ABNORMAL LOW (ref 135–145)
Sodium: 128 mmol/L — ABNORMAL LOW (ref 135–145)
Sodium: 129 mmol/L — ABNORMAL LOW (ref 135–145)

## 2022-01-04 LAB — POCT I-STAT 7, (LYTES, BLD GAS, ICA,H+H)
Acid-Base Excess: 5 mmol/L — ABNORMAL HIGH (ref 0.0–2.0)
Bicarbonate: 30.5 mmol/L — ABNORMAL HIGH (ref 20.0–28.0)
Calcium, Ion: 1.17 mmol/L (ref 1.15–1.40)
HCT: 37 % (ref 36.0–46.0)
Hemoglobin: 12.6 g/dL (ref 12.0–15.0)
O2 Saturation: 100 %
Patient temperature: 97.6
Potassium: 2.6 mmol/L — CL (ref 3.5–5.1)
Sodium: 119 mmol/L — CL (ref 135–145)
TCO2: 32 mmol/L (ref 22–32)
pCO2 arterial: 45.5 mmHg (ref 32–48)
pH, Arterial: 7.432 (ref 7.35–7.45)
pO2, Arterial: 356 mmHg — ABNORMAL HIGH (ref 83–108)

## 2022-01-04 LAB — GLUCOSE, CAPILLARY
Glucose-Capillary: 114 mg/dL — ABNORMAL HIGH (ref 70–99)
Glucose-Capillary: 104 mg/dL — ABNORMAL HIGH (ref 70–99)
Glucose-Capillary: 128 mg/dL — ABNORMAL HIGH (ref 70–99)
Glucose-Capillary: 146 mg/dL — ABNORMAL HIGH (ref 70–99)
Glucose-Capillary: 110 mg/dL — ABNORMAL HIGH (ref 70–99)
Glucose-Capillary: 200 mg/dL — ABNORMAL HIGH (ref 70–99)

## 2022-01-04 LAB — LACTATE DEHYDROGENASE: LDH: 161 U/L (ref 98–192)

## 2022-01-04 LAB — ECHOCARDIOGRAM COMPLETE
AV Vena cont: 0.2 cm
Calc EF: 24.1 %
Height: 60 in
MV M vel: 4.84 m/s
MV Peak grad: 93.7 mmHg
P 1/2 time: 603 msec
Radius: 0.4 cm
S' Lateral: 3.1 cm
Single Plane A2C EF: 21.8 %
Single Plane A4C EF: 29.5 %
Weight: 2377.44 oz

## 2022-01-04 LAB — URINE CULTURE: Culture: NO GROWTH

## 2022-01-04 LAB — PHOSPHORUS
Phosphorus: 2.1 mg/dL — ABNORMAL LOW (ref 2.5–4.6)
Phosphorus: 2 mg/dL — ABNORMAL LOW (ref 2.5–4.6)

## 2022-01-04 LAB — LACTATE DEHYDROGENASE, PLEURAL OR PERITONEAL FLUID: LD, Fluid: 278 U/L — ABNORMAL HIGH (ref 3–23)

## 2022-01-04 LAB — LACTIC ACID, PLASMA: Lactic Acid, Venous: 1.9 mmol/L (ref 0.5–1.9)

## 2022-01-04 LAB — MAGNESIUM
Magnesium: 1.6 mg/dL — ABNORMAL LOW (ref 1.7–2.4)
Magnesium: 1.6 mg/dL — ABNORMAL LOW (ref 1.7–2.4)

## 2022-01-04 LAB — STREP PNEUMONIAE URINARY ANTIGEN: Strep Pneumo Urinary Antigen: NEGATIVE

## 2022-01-04 LAB — PROTEIN, TOTAL: Total Protein: 5.6 g/dL — ABNORMAL LOW (ref 6.5–8.1)

## 2022-01-04 LAB — TRIGLYCERIDES: Triglycerides: 126 mg/dL (ref <150)

## 2022-01-04 LAB — TROPONIN I (HIGH SENSITIVITY): Troponin I (High Sensitivity): 131 ng/L (ref <18)

## 2022-01-04 MED ORDER — VITAL AF 1.2 CAL PO LIQD: 1000.0000 mL | ORAL | Status: DC

## 2022-01-04 MED ORDER — HEPARIN (PORCINE) 25000 UT/250ML-% IV SOLN: 850.0000 [IU]/h | INTRAVENOUS | Status: DC

## 2022-01-04 MED ORDER — HEPARIN (PORCINE) 25000 UT/250ML-% IV SOLN
950.0000 [IU]/h | INTRAVENOUS | Status: AC
  Administered 2022-01-04: 850 [IU]/h via INTRAVENOUS
  Administered 2022-01-05: 750 [IU]/h via INTRAVENOUS
  Administered 2022-01-07: 900 [IU]/h via INTRAVENOUS
  Filled 2022-01-04 (×3): qty 250

## 2022-01-04 MED ORDER — PERFLUTREN LIPID MICROSPHERE
1.0000 mL | INTRAVENOUS | Status: AC | PRN
  Administered 2022-01-04: 4 mL via INTRAVENOUS

## 2022-01-04 MED ORDER — VITAL AF 1.2 CAL PO LIQD
1000.0000 mL | ORAL | Status: DC
  Administered 2022-01-04 – 2022-01-05 (×2): 1000 mL
  Filled 2022-01-04: qty 1000

## 2022-01-04 NOTE — Procedures (Signed)
Bedside chest ultrasound:  Showing moderate size pleural effusion on right side

## 2022-01-04 NOTE — Progress Notes (Signed)
Initial Nutrition Assessment  DOCUMENTATION CODES:   Not applicable  INTERVENTION:   Tube Feeding via OG:  Vital AF 1.2 at 55 ml/hr Begin at 20 ml/hr; titrate by 10 mL q 8 hours until goal rate of 55 ml/hr This provides 1584 kcals, 99 g of protein and 1069 mL of free water  NUTRITION DIAGNOSIS:   Inadequate oral intake related to acute illness as evidenced by NPO status.  GOAL:   Patient will meet greater than or equal to 90% of their needs  MONITOR:   TF tolerance, Vent status, Labs, Weight trends  REASON FOR ASSESSMENT:   Consult, Ventilator Enteral/tube feeding initiation and management  ASSESSMENT:   81 yo female admitted with acute respiratory failure related to COPD exacerbation and pneumonia, went into PEA arrest and required intubation. PMH includes A.fib, breast cancer, HTN, GERD, asthma, melanoma  7/28 Admit 7/30 IHCA x 7 minutes, Intubated  Pt remains sedated on vent support  OG tube with tip in stomach per xray  Unable to obtain diet and weight history from patient at this time.   Recorded po intake 100% on 7/29, no other intake recorded.   Weight trend stable per weight encounters. Weight up post arrest. Admit wt 63.8 kg, weight today 67.4 kg.   Labs: sodium 129 (L), Creatinine wdl, potassium wdl Meds: colace, ss novolog, solumedrol, miralax   Diet Order:   Diet Order             Diet NPO time specified  Diet effective now                   EDUCATION NEEDS:   Not appropriate for education at this time  Skin:  Skin Assessment: Reviewed RN Assessment  Last BM:  7/30  Height:   Ht Readings from Last 1 Encounters:  01/04/22 5' (1.524 m)    Weight:   Wt Readings from Last 1 Encounters:  01/04/22 67.4 kg     BMI:  Body mass index is 29.02 kg/m.  Estimated Nutritional Needs:   Kcal:  1400-1600 kcals  Protein:  75-85 g  Fluid:  >/= 1.5 L  Kerman Passey MS, RDN, LDN, CNSC Registered Dietitian 3 Clinical Nutrition RD  Pager and On-Call Pager Number Located in [facility]

## 2022-01-04 NOTE — Progress Notes (Signed)
SLP Cancellation Note  Patient Details Name: JOURNE HALLMARK MRN: [id number] DOB: 03-Nov-1940   Cancelled treatment:       Reason Eval/Treat Not Completed: Medical issues which prohibited therapy- on ventilator.  Will sign off.  Antia Rahal L. Tivis [name], MA CCC/SLP Clinical Specialist - Acute Care SLP Acute Rehabilitation Services Office number [phone]    [name] 01/04/2022, 8:56 AM

## 2022-01-04 NOTE — Progress Notes (Signed)
ANTICOAGULATION CONSULT NOTE - Initial Consult  Pharmacy Consult for heparin Indication: atrial fibrillation  Allergies  Allergen Reactions   Chlorhexidine Gluconate Hives, Swelling and Rash       "ChloraPrep "   Doxycycline Nausea And Vomiting   Codeine Nausea Only and Other (See Comments)    Pt can tolerate when pre-medicated   Doxycycline Nausea And Vomiting   Nsaids     avoid due to gerd   Nsaids Other (See Comments)    Gi bleed   Tylenol [Acetaminophen]     Avoid due to GERD   Adhesive [Tape] Rash    Dermabond   Chlorhexidine Swelling and Rash   Codeine Nausea Only   Tylenol [Acetaminophen] Other (See Comments)    GI bleed    Patient Measurements: Height: 5' (152.4 cm) Weight: 67.4 kg (148 lb 9.4 oz) IBW/kg (Calculated) : 45.5 Heparin Dosing Weight: 60 kg   Vital Signs: Temp: 99.9 F (37.7 C) (07/31 1400) Temp Source: Bladder (07/31 1200) BP: 138/84 (07/31 1400) Pulse Rate: 68 (07/31 1400)  Labs: Recent Labs    12/23/2021 2249 01/02/22 0029 01/02/22 1308 01/03/22 0735 01/03/22 1741 01/03/22 2119 01/03/22 2120 01/04/22 0152 01/04/22 0513 01/04/22 1014  HGB 14.7  --   --   --  12.6 11.8*  --  12.8  --   --   HCT 41.9  --   --   --  37.0 31.8*  --  34.7*  --   --   PLT 153  --   --   --   --  145*  --  146*  --   --   APTT  --   --   --   --   --  39*  --   --   --   --   LABPROT  --   --   --   --   --  37.3*  --   --   --   --   INR  --   --   --   --   --  3.8*  --   --   --   --   HEPARINUNFRC  --   --   --   --   --   --  >1.10*  --   --   --   CREATININE  --    < >  --    < >  --  0.68  --  0.73 0.62 0.66  TROPONINIHS  --    < > 11  --   --  123*  --   --  131*  --    < > = values in this interval not displayed.    Estimated Creatinine Clearance: 49.7 mL/min (by C-G formula based on SCr of 0.66 mg/dL).   Medical History: Past Medical History:  Diagnosis Date   Anemia    history of   Anxiety    Arthritis    Asthma    Breast cancer  ([place])    Cancer ([place])    skin cancers, one melanoma    Complication of anesthesia    patient woke up during colonoscopy   Concussion    8 yrs. ago due to being thrown from a horse   Dislocated elbow 10/12/2016   left   Family history of breast cancer    GERD (gastroesophageal reflux disease)    History of kidney stones    History of radiation therapy 03/23/17-05/04/17   right  chest wall 50.4 Gy in 28 fractions, right axilla 45 Gy in 25 fractions   Hypertension    Hypertensive heart disease without CHF    Keratosis, actinic    Melanoma ([facility])    Persistent atrial fibrillation ([facility]) 12/16/2016   CHA2DS2VASC score 3   Pneumonia    hx of     Medications:  Scheduled:   arformoterol  15 mcg Nebulization BID   budesonide (PULMICORT) nebulizer solution  0.25 mg Nebulization BID   docusate  100 mg Per Tube BID   fentaNYL (SUBLIMAZE) injection  25 mcg Intravenous Once   insulin aspart  0-9 Units Subcutaneous Q4H   methylPREDNISolone (SOLU-MEDROL) injection  40 mg Intravenous Q12H   midazolam  2 mg Intravenous Once   mouth rinse  15 mL Mouth Rinse Q2H   pantoprazole (PROTONIX) IV  40 mg Intravenous QHS   polyethylene glycol  17 g Per Tube Daily   revefenacin  175 mcg Nebulization Daily   sodium chloride HYPERTONIC  4 mL Nebulization BID    Assessment: 44 yof presenting with acute hypoxemia related to COPD excerbation/PNA complicated by PEA arrest  - on Xarelto PTA for Afib (LD 7/29).   Hgb 12.8, plt 146. No s/sx of bleeding. Now s/p thoracentesis - will start heparin while Xarelto on hold.   Given recent DOAC will monitor both aPTT and heparin level until correlate.   Goal of Therapy:  Heparin level 0.3-0.7 units/ml aPTT 66-102 seconds Monitor platelets by anticoagulation protocol: Yes   Plan:  Start heparin infusion at 850 units/hr Check anti-Xa and aPTT level in 8 hours and daily while on heparin until correlate Continue to monitor H&H and platelets  [name],  PharmD, [place] Pharmacist  Phone: [phone] 01/04/2022 2:20 PM  Please check AMION for all [facility] phone numbers After 10:00 PM, call [facility] [phone]

## 2022-01-04 NOTE — Progress Notes (Signed)
  Echocardiogram 2D Echocardiogram has been performed.  [name] 01/04/2022, 9:42 AM

## 2022-01-04 NOTE — Procedures (Signed)
Insertion of Chest Tube Procedure Note  DEEANNE DEININGER  [id number]  May 19, 1941  Date:01/04/22  Time:5:56 PM    Provider Performing: Jacky Kindle   Procedure: Pleural Catheter Insertion w/ Imaging Guidance [phone])  Indication(s) Effusion  Consent Risks of the procedure as well as the alternatives and risks of each were explained to the patient and/or caregiver.  Consent for the procedure was obtained and is signed in the bedside chart  Anesthesia Topical only with 1% lidocaine    Time Out Verified patient identification, verified procedure, site/side was marked, verified correct patient position, special equipment/implants available, medications/allergies/relevant history reviewed, required imaging and test results available.   Sterile Technique Maximal sterile technique including full sterile barrier drape, hand hygiene, sterile gown, sterile gloves, mask, hair covering, sterile ultrasound probe cover (if used).   Procedure Description Ultrasound used to identify appropriate pleural anatomy for placement and overlying skin marked. Area of placement cleaned and draped in sterile fashion.  A 14 French pigtail pleural catheter was placed into the right pleural space using Seldinger technique. Appropriate return of fluid was obtained.  The tube was connected to atrium and placed on -20 cm H2O wall suction.   Complications/Tolerance None; patient tolerated the procedure well. Chest X-ray is ordered to verify placement.   EBL Minimal  Specimen(s) Pleural fluid

## 2022-01-04 NOTE — Progress Notes (Signed)
Progress Note  Patient Name: [name] Date of Encounter: 01/04/2022  Primary Cardiologist:   Kirk Ruths, MD   Subjective   Intubated and sedated.  PEA arrest following worsening SOB yesterday.    Inpatient Medications    Scheduled Meds:  arformoterol  15 mcg Nebulization BID   budesonide (PULMICORT) nebulizer solution  0.25 mg Nebulization BID   docusate  100 mg Per Tube BID   fentaNYL (SUBLIMAZE) injection  25 mcg Intravenous Once   insulin aspart  0-9 Units Subcutaneous Q4H   letrozole  2.5 mg Oral Daily   methylPREDNISolone (SOLU-MEDROL) injection  40 mg Intravenous Q12H   midazolam  2 mg Intravenous Once   mouth rinse  15 mL Mouth Rinse Q2H   pantoprazole (PROTONIX) IV  40 mg Intravenous QHS   polyethylene glycol  17 g Per Tube Daily   revefenacin  175 mcg Nebulization Daily   sodium chloride HYPERTONIC  4 mL Nebulization BID   Continuous Infusions:  azithromycin (ZITHROMAX) 500 mg in sodium chloride 0.9 % 250 mL IVPB Stopped (01/04/22 0001)   ceFEPime (MAXIPIME) IV Stopped (01/04/22 0038)   fentaNYL infusion INTRAVENOUS 50 mcg/hr (01/04/22 0700)   norepinephrine (LEVOPHED) Adult infusion 5 mcg/min (01/04/22 0700)   propofol (DIPRIVAN) infusion 10 mcg/kg/min (01/04/22 0736)   vancomycin     vasopressin Stopped (01/03/22 2119)   PRN Meds: acetaminophen, fentaNYL, melatonin, mouth rinse, polyethylene glycol   Vital Signs    Vitals:   01/04/22 0700 01/04/22 0750 01/04/22 0800 01/04/22 0802  BP: 112/80 122/68 122/82 120/68  Pulse: 67 63 73 65  Resp: (!) 28 (!) 28 (!) 23 (!) 28  Temp: 99.5 F (37.5 C)  99.5 F (37.5 C)   TempSrc:   Bladder   SpO2: 100% 100% 100% 100%  Weight:      Height:        Intake/Output Summary (Last 24 hours) at 01/04/2022 0825 Last data filed at 01/04/2022 0754 Gross per 24 hour  Intake 1623.03 ml  Output 2780 ml  Net -1156.97 ml   Filed Weights   01/02/22 0455 01/03/22 0630 01/04/22 0500  Weight: 63.8 kg 65.2 kg  67.4 kg    Telemetry    Atrial fib with controlled ventricular rate - Personally Reviewed  ECG    NA - Personally Reviewed  Physical Exam   GEN: No acute distress.   Neck: No  JVD Cardiac: Irregular RR, no murmurs, rubs, or gallops.   Distant heart sounds Respiratory: Clear  to auscultation bilaterally. GI: Soft, nontender, non-distended  MS:    Mild non pitting edema; No deformity. Neuro:  Nonfocal  Psych: Normal affect   Labs    Chemistry Recent Labs  Lab 01/02/22 0029 01/03/22 0735 01/03/22 2119 01/04/22 0152 01/04/22 0513  NA 130*   < > 123* 127* 127*  K 3.4*   < > 2.6* 4.2 4.8  CL 93*   < > 85* 90* 92*  CO2 25   < > '26 25 25  '$ GLUCOSE 119*   < > 165* 151* 142*  BUN 8   < > '13 15 14  '$ CREATININE 0.73   < > 0.68 0.73 0.62  CALCIUM 8.8*   < > 8.8* 9.0 9.1  PROT 6.6  --  5.5* 5.9*  --   ALBUMIN 3.0*  --  2.3* 2.4*  --   AST 36  --  59* 69*  --   ALT 18  --  26 26  --  ALKPHOS 95  --  63 64  --   BILITOT 1.4*  --  2.0* 2.1*  --   GFRNONAA >60   < > >60 >60 >60  ANIONGAP 12   < > '12 12 10   '$ < > = values in this interval not displayed.     Hematology Recent Labs  Lab 12/24/2021 2249 01/03/22 1741 01/03/22 2119 01/04/22 0152  WBC 9.5  --  20.8* 18.8*  RBC 4.05  --  3.18* 3.48*  HGB 14.7 12.6 11.8* 12.8  HCT 41.9 37.0 31.8* 34.7*  MCV 103.5*  --  100.0 99.7  MCH 36.3*  --  37.1* 36.8*  MCHC 35.1  --  37.1* 36.9*  RDW 13.2  --  12.2 12.4  PLT 153  --  145* 146*    Cardiac EnzymesNo results for input(s): "TROPONINI" in the last 168 hours. No results for input(s): "TROPIPOC" in the last 168 hours.   BNP Recent Labs  Lab 12/31/2021 1813 01/04/2022 2249  BNP 311.6* 479.9*     DDimer  Recent Labs  Lab 01/02/22 1308  DDIMER 1.11*     Radiology    DG Chest Port 1 View  Result Date: 01/04/2022 CLINICAL DATA:  [id number]; respiratory distress EXAM: PORTABLE CHEST 1 VIEW COMPARISON:  CT dated January 03, 2022; chest radiograph dated December 07, 2021 FINDINGS:  The cardiomediastinal silhouette is unchanged and partially obscured in contour.ETT tip terminates 3.3 cm above the carina. Enteric tube tip and side port project over the stomach. LEFT IJ CVC tip terminates over the superior cavoatrial junction. Moderate to large RIGHT-sided pleural effusion, similar in comparison to most recent prior. Small LEFT pleural effusion. Heterogeneously consolidative RIGHT basilar airspace opacity with minimally improved aeration in comparison to most recent prior chest radiograph. This is overall markedly worsened since January 01, 2022. No pneumothorax. IMPRESSION: 1.  Support apparatus as described above. 2. Moderate to large RIGHT-sided pleural effusion of the RIGHT basilar airspace opacity. Minimally improved aeration in comparison to most recent prior chest radiograph but overall markedly worsened since July 28th. Electronically Signed   By: Valentino Saxon M.D.   On: 01/04/2022 07:51   CT CHEST ABDOMEN PELVIS WO CONTRAST  Result Date: 01/03/2022 CLINICAL DATA:  Inpatient exam for sepsis. EXAM: CT CHEST, ABDOMEN AND PELVIS WITHOUT CONTRAST TECHNIQUE: Multidetector CT imaging of the chest, abdomen and pelvis was performed following the standard protocol without IV contrast. RADIATION DOSE REDUCTION: This exam was performed according to the departmental dose-optimization program which includes automated exposure control, adjustment of the mA and/or kV according to patient size and/or use of iterative reconstruction technique. COMPARISON:  Portable chest today, portable chest studies 01/02/2022 and 01/04/2022, CTA chest yesterday at 10:57 p.m., and abdomen and pelvis CT no contrast 07/28/2008. FINDINGS: CT CHEST FINDINGS Cardiovascular: Moderate panchamber cardiomegaly with biatrial predominance is again noted. Again also noted is minimal air in the anterior aspect of the right atrium likely iatrogenic. There is a left IJ central line terminating at the superior cavoatrial  junction. Minimal calcification noted proximal LAD coronary artery only, mild to moderate aortic atherosclerosis and tortuosity without aneurysm the pulmonary trunk upper limit of normal in caliber but unchanged. Central veins are mildly distended. Mediastinum/Nodes: There is a mildly prominent precarinal lymph node left of the midline again noted measuring 1.2 cm in short axis a similar sized subcarinal node. No further adenopathy is seen. Axillary spaces are clear. There are surgical clips in the right axilla. Enteric tube is in place  with otherwise unremarkable esophagus. ETT abuts the right side of the distal trachea with the tip 2 cm from the carina. The main bronchi are clear. Thyroid is unremarkable. Lungs/Pleura: Right hemidiaphragm is elevated to the level of the mid hilum but this has been noted on multiple prior chest x-rays. There is increasing moderate-to-large right pleural effusion, majority layering posteriorly but some of it could be loculated at least partially in the lateral base. The effusion completely collapses the right lower lobe on today's study and there is atelectasis or consolidation, with air bronchograms, in the overlying right middle lobe. There is a band of consolidation anteriorly in the right upper lobe which was seen yesterday and could be band atelectasis or pneumonia. No subpleural interstitial edema seen. There is subpleural atelectasis again in the posterior basal left lower lobe, linear scarring or atelectasis in the lingular base. There is faint left perihilar haziness which may suspect ground-glass edema or minimal pneumonitis. Left lung is otherwise clear.  There is no pneumothorax. Musculoskeletal: There is osteopenia, degenerative change of the thoracic spine and mild thoracic kyphodextroscoliosis. No acute or significant osseous findings are seen in the thorax. On the left there is a subpleural breast implant, prepectoral breast implant on the right which is probably an  implant reconstruction postmastectomy. No acute chest wall abnormality is seen. CT ABDOMEN PELVIS FINDINGS Hepatobiliary: Interval development of capsular nodularity in the left lobe of the liver consistent with cirrhosis. The main portal vein is normal caliber. No focal liver lesion is seen allowing for artifact from the patient's arms. Gallbladder is distended and contains contrast and a few tiny stones. There is faint pericholecystic edema. Pancreas: No focal abnormality without contrast. Spleen: No focal abnormality without contrast. Adrenals/Urinary Tract: There is no adrenal mass. No renal mass is seen. There is persistent contrast enhancement of the bilateral renal cortex following yesterday's CTA chest concerning for contrast nephrotoxicity. There is no contrast in the urinary collecting systems and bladder. There is asymmetric stranding around the right kidney. There is a 3 mm nonobstructive caliceal stone anteriorly in the upper pole of the right kidney. No other urinary stones or obstructing stones are seen. There is no hydroureteronephrosis The bladder is contracted around a Foley balloon and not well seen but it does not appear thickened. Vascular/Lymphatic: Moderate aortic patchy calcification without aneurysm. No adenopathy is seen. Stomach/Bowel: NGT loops around within the stomach to the left with tip directed cephalad in the proximal stomach. The gastric wall, unopacified small bowel and retrocecal appendix are unremarkable. There are no findings of acute colitis or diverticulitis, with left-sided diverticula most numerous in the sigmoid. Reproductive: Uterus and bilateral adnexa are unremarkable. Other: There is moderate retroperitoneal stranding opacity beginning at the level of the renal veins, surrounding the bilateral renal veins, IVC and abdominal aorta continuing as far inferiorly as the aortic bifurcation, with scattered nonlocalizing fluid. For the most part this is below the level of the  pancreas with only the most distal pancreatic tail abutting the process. Etiology indeterminate. No localizing fluid collection is seen. No free air is seen and no abscess or free hemorrhage is suspected. There is no incarcerated hernia. Trace ascites posterior deep pelvis and perihepatic space. There is also mild body wall anasarca. Musculoskeletal: There are degenerative changes of the lumbar spine. 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 IMPRESSION: 1. Interval intubation, NGT placement, left IJ central line. 2.  Worsening right pleural effusion now completely collapsing the right lower lobe, with consolidation or atelectasis in the adjacent right middle lobe, and band consolidation or atelectasis again in the anterior right upper lobe. Follow-up study recommended to ensure clearing. 3. Panchamber cardiomegaly with biatrial predominance, slightly prominent central pulmonary veins, no subpleural edema seen but there is left perihilar haziness which could be ground-glass edema or pneumonitis. 4. Previous right mastectomy with implant reconstruction. No chest wall mass. 5. Cirrhotic liver changes developed since 2010 but no splenomegaly. Trace ascites. No portal vein dilatation. 6. Distended gallbladder with small stones, contrast and faint pericholecystic edema, findings could be due to congestive edema or cholecystitis. No biliary dilatation. 7. Persistent contrast enhancement of the renal cortex since yesterday's CTA chest. This is concerning for contrast nephrotoxicity. 8. Asymmetric right perinephric stranding, which could be congestive or inflammatory. 9. Moderate retroperitoneal stranding and scattered nonlocalizing fluid extending from the renal veins to the aortic bifurcation. Only a small portion of this is in contact with the distal pancreas. Differential diagnosis includes congestive edema such  as due to IVC and/or renal vein thrombosis, inflammatory/infectious process such as retroperitoneal fasciitis, and less likely pancreatitis. Close clinical follow-up is recommended and surgical consultation suggested. There is no soft tissue gas. Study is performed without contrast and patency of the IVC and renal veins is not known but IVC patency was seen last night. 10. Trace perihepatic and pelvic ascites. 11. Aortic and coronary artery atherosclerosis. 12. These results will be called to the ordering clinician or representative by the radiology assistant, and communication documented in the PACS or [facility] dashboard. Electronically Signed   By: Telford Nab M.D.   On: 01/03/2022 21:37   CT HEAD WO CONTRAST (5MM)  Result Date: 01/03/2022 CLINICAL DATA:  Altered mental status. EXAM: CT HEAD WITHOUT CONTRAST TECHNIQUE: Contiguous axial images were obtained from the base of the skull through the vertex without intravenous contrast. RADIATION DOSE REDUCTION: This exam was performed according to the departmental dose-optimization program which includes automated exposure control, adjustment of the mA and/or kV according to patient size and/or use of iterative reconstruction technique. COMPARISON:  Head CT dated 12/28/2020. FINDINGS: Brain: Mild age-related atrophy and chronic microvascular ischemic changes. There is no acute intracranial hemorrhage. No mass effect or midline shift. No extra-axial fluid collection. Vascular: No hyperdense vessel or unexpected calcification. Skull: Normal. Negative for fracture or focal lesion. Sinuses/Orbits: Complete opacification of the right maxillary sinus and multiple ethmoid air cells. No air-fluid level. The mastoid air cells are clear. Other: None IMPRESSION: 1. No acute intracranial pathology. 2. Mild age-related atrophy and chronic microvascular ischemic changes. Electronically Signed   By: Anner Crete M.D.   On: 01/03/2022 20:56   DG Chest 1 View  Result Date:  01/03/2022 CLINICAL DATA:  Check endotracheal tube placement, respiratory distress EXAM: PORTABLE CHEST 1 VIEW COMPARISON:  Films from the previous day. FINDINGS: Cardiac shadow is stable. Increasing right-sided effusion and right basilar consolidation are seen. Left jugular central line is noted at the cavoatrial junction. No pneumothorax is seen. Endotracheal tube and gastric catheter are seen in satisfactory position. Mild left basilar atelectasis is seen. IMPRESSION: Increasing right-sided pleural effusion and basilar consolidation. Mild left basilar atelectasis. Tubes and lines in satisfactory position. Electronically Signed   By: Inez Catalina M.D.   On: 01/03/2022 19:05   CT Angio Chest Pulmonary Embolism (PE) W or WO Contrast  Result Date: 01/02/2022 CLINICAL DATA:  Chest pain and shortness of breath. EXAM: CT ANGIOGRAPHY  CHEST WITH CONTRAST TECHNIQUE: Multidetector CT imaging of the chest was performed using the standard protocol during bolus administration of intravenous contrast. Multiplanar CT image reconstructions and MIPs were obtained to evaluate the vascular anatomy. RADIATION DOSE REDUCTION: This exam was performed according to the departmental dose-optimization program which includes automated exposure control, adjustment of the mA and/or kV according to patient size and/or use of iterative reconstruction technique. CONTRAST:  12m OMNIPAQUE IOHEXOL 350 MG/ML SOLN COMPARISON:  Chest radiograph dated 01/02/2022. FINDINGS: Cardiovascular: Mild cardiomegaly with biatrial enlargement. Retrograde flow of the contrast from the right atrium into the IVC in keeping with right heart dysfunction. Correlation with echocardiogram recommended. No pericardial effusion. Coronary vascular calcification of the LAD. There is mild atherosclerotic calcification of the thoracic aorta. No aneurysmal dilatation. Evaluation of the pulmonary arteries is limited due to respiratory motion. No pulmonary artery embolus  identified. Mediastinum/Nodes: No adenopathy in the left hilum. Mediastinal lymph node measures approximately 9 mm in short axis anterior to the carina. Evaluation of the right hilar lymph nodes is limited due to consolidative changes of the right lung. The esophagus is grossly unremarkable. No mediastinal fluid collection. Lungs/Pleura: There is a small right pleural effusion. Large areas of consolidation with air bronchogram involving the right lower and right middle lobes consistent with atelectasis or infiltrate. Underlying mass is not excluded. Clinical correlation and follow-up to resolution recommended. Minimal left lung base atelectasis. No pneumothorax. The central airways are patent. 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 Upper Abdomen: Cirrhosis. Musculoskeletal: Osteopenia degenerative changes of the spine. No acute osseous pathology. Bilateral breast implants. Review of the MIP images confirms the above findings. IMPRESSION: 1. No CT evidence of pulmonary artery embolus. 2. Cardiomegaly with biatrial enlargement and evidence of right heart dysfunction. Correlation with echocardiogram recommended. 3. Large areas of consolidation with air bronchogram involving the right lower and right middle lobes consistent with atelectasis or infiltrate. Underlying mass is not excluded. Clinical correlation and follow-up to resolution recommended. 4. Small right pleural effusion. 5. Tracheomalacia. 6. Aortic Atherosclerosis (ICD10-I70.0). Electronically Signed   By: AAnner CreteM.D.   On: 01/02/2022 23:16   DG CHEST PORT 1 VIEW  Result Date: 01/02/2022 CLINICAL DATA:  Shortness of breath EXAM: PORTABLE CHEST 1 VIEW COMPARISON:  Chest x-ray 12/14/2021 FINDINGS: There is a new moderate-sized right pleural effusion. There is new focal density in the right perihilar and right suprahilar region. Left lung is clear. No pneumothorax. Cardiomediastinal silhouette is grossly unchanged. Surgical clips overlie the right lower chest  and right axilla similar to the prior study. No acute fractures are seen. IMPRESSION: 1. New moderate-sized right pleural effusion. 2. New right perihilar focal opacities. Findings may represent pneumonia, but underlying neoplastic process not excluded. Please correlate clinically. Follow-up chest CT recommended. Electronically Signed   By: ARonney AstersM.D.   On: 01/02/2022 17:10    Cardiac Studies   Echo:  Pending.   Patient Profile     81y.o. female with a hx of atrial fib on propranolol 80 mg qd and Xarelto, HTN, asthma, breast CA, anxiety, who is being seen 01/02/2022 for the evaluation of CHF & rapid atrial fib at the request of Dr OMarthenia Rolling  Assessment & Plan    Atrial fib with RVR:   Off Xareto with intubation.  Start heparin.   Rate is controlled  Hyponatremia:  Potassium is low but stable.    AI/MR:  Noted on prelim echo review.  Mild/Mod AI likely with at least moderate MR.  (MR worse than  previous likely functional.)   Acute systolic HF:   EF markedly reduced with possible Takotsubo.    Final echo reading pending.  This is a new onset cardiomyopathy.     Respiratory failure:  Intubated with large pleural effusion.  Consider thoracentesis per primary team.     For questions or updates, please contact [place] Please consult [email or web address] for contact info under Cardiology/STEMI.   Signed, Minus Breeding, MD  01/04/2022, 8:25 AM

## 2022-01-04 NOTE — Consult Note (Addendum)
NAME:  [name], MRN:  [id number], DOB:  04/13/1941, LOS: 3 ADMISSION DATE:  12/24/2021, CONSULTATION DATE:  01/03/22 REFERRING MD:  Graciella Freer, CHIEF COMPLAINT:  cardiac arrest   History of Present Illness:  81 year old woman w/ hx of Afib on xarelto who presented with R sided pleurisy, cough, fevers.  Found to have R sided pneumonia, new hypoxemia, afib/RVR, and admitted to progressive.  Also tx empirically for possible COPD exacerbation.  Today worsening SOB then found pulseless.  7 min CPR, PEA.  Some purposeful movement after arrest.  PCCM consulted.  Pertinent  Medical History  Breast ca on letrozole Afib on [place] [facility] Events: Including procedures, antibiotic start and stop dates in addition to other pertinent events   7/28 admit 7/30 IHCA x 7 min  Interim History / Subjective:  CXR with Moderate to large RIGHT-sided pleural effusion of the RIGHT basilar airspace opacity. Last Dose Jennye Moccasin 7/29 Remains on full vent support , , Levo at 5, Fentanyl at 50, Prop at 10   Objective   Blood pressure 109/80, pulse 66, temperature 99.3 F (37.4 C), resp. rate (!) 28, height 5' (1.524 m), weight 67.4 kg, SpO2 100 %.    Vent Mode: PRVC FiO2 (%):  [40 %-100 %] 40 % Set Rate:  [28 bmp] 28 bmp Vt Set:  [360 mL] 360 mL PEEP:  [5 cmH20] 5 cmH20 Plateau Pressure:  [18 cmH20-35 cmH20] 18 cmH20   Intake/Output Summary (Last 24 hours) at 01/04/2022 1105 Last data filed at 01/04/2022 1000 Gross per 24 hour  Intake 1746.39 ml  Output 2735 ml  Net -988.61 ml   Filed Weights   01/02/22 0455 01/03/22 0630 01/04/22 0500  Weight: 63.8 kg 65.2 kg 67.4 kg    Examination: General: Ill appearing  female, sedated and intubated , on pressors, in NAD HENT: PERRLA 3 mm and reactive Lungs: Bilateral chest excursion, diminished bilaterally worse on right and rhonci on R, triggers vent Cardiovascular: S1, S2, RRR, , bedside echo 7/30  grossly preserved EF, dilated aortic  root Abdomen: soft, NT, hypoactive BS Extremities: No obvious deformities, PAS hose , cool to touch Neuro: MAE x 4, purposefully reaches for ETT  Skin: Cool to touch, dry and intact, no rash, lesions noted  Na 129/ K 4/ Cl 95/ CO2 25/ BUN 13/ Creatinine 0.66/ Ca 9 Triglyceride 126 Troponin 131 Lactic acid 1.9 Strep Pneumonia negative. Legionella pending AST 69, Albumin 2.4, Total bili 2.1 HGB 12.8/ WBC 18.8, platelets 146  Resolved Hospital Problem list   N/A  Assessment & Plan:     Acute hyponatremia question ADH response in setting of worsening sepsis Trending in the correct direction  - Minimizing additional fluids  - Trend BMET - Diuresis as needed   IHCA in context of R sided pneumonia and worsening resp symptoms.  PE doubtful as has been on [facility].  Suspect hypoxemia-induced. Post arrest profound shock Lactate has cleared - 2L LR, levophed/vaso to MAP 65 - Formal echo - Trend troponin  - EKG - Antihypertensives on hold - Last dose Xarelto 7/29 - Plan to start heparin 7/31  Post arrest acute hypoxemic respiratory failure - Continue ventilator support with lung protective strategies  - Wean PEEP and FiO2 for sats greater than 90%. - Head of bed elevated 30 degrees. - Plateau pressures less than 30 cm H20.  -Follow intermittent chest x-ray and ABG.  >> worsening effusions and pulmonary edema 7/31 - SAT/SBT as tolerated, mentation preclude extubation  -  Ensure adequate pulmonary hygiene  - Follow cultures  - VAP bundle in place  - PAD protocol   R PNA with parapneumonic effusion>> Large pleural effusion  - Abx to vanc/cefepime/azithroymycin - Send urine Ag for strep/legionella - Will check with [place] for AP window again today - Fine to start heparin today after evaluating for need for Chest tube - consider additional diuresis   Post Arrest Encephalopathy - Minimize sedation  - Frequent re-orientation  - Lights on during the day, off at night   Nutrition  Will  consult Dietitian for TF initiation     Best Practice (right click and "Reselect all SmartList Selections" daily)   Diet/type: NPO DVT prophylaxis: DOAC GI prophylaxis: PPI Lines: Central line Foley:  Yes, and it is still needed Code Status:  full code Last date of multidisciplinary goals of care discussion [pending]  Labs   CBC: Recent Labs  Lab 12/08/2021 1813 12/14/2021 1819 12/21/2021 2249 01/03/22 1741 01/03/22 2119 01/04/22 0152  WBC 9.5  --  9.5  --  20.8* 18.8*  NEUTROABS 8.7*  --  8.3*  --   --   --   HGB 15.1* 16.3* 14.7 12.6 11.8* 12.8  HCT 44.3 48.0* 41.9 37.0 31.8* 34.7*  MCV 106.2*  --  103.5*  --  100.0 99.7  PLT 190  --  153  --  145* 146*    Basic Metabolic Panel: Recent Labs  Lab 01/02/22 0029 01/03/22 0735 01/03/22 1741 01/03/22 2119 01/04/22 0152 01/04/22 0513  NA 130* 120* 119* 123* 127* 127*  K 3.4* 3.7 2.6* 2.6* 4.2 4.8  CL 93* 82*  --  85* 90* 92*  CO2 25 24  --  '26 25 25  '$ GLUCOSE 119* 110*  --  165* 151* 142*  BUN 8 13  --  '13 15 14  '$ CREATININE 0.73 0.62  --  0.68 0.73 0.62  CALCIUM 8.8* 9.5  --  8.8* 9.0 9.1  MG 1.0* 1.9  --   --   --   --   PHOS 3.7  --   --   --   --   --    GFR: Estimated Creatinine Clearance: 49.7 mL/min (by C-G formula based on SCr of 0.62 mg/dL). Recent Labs  Lab 12/31/2021 1813 12/09/2021 2249 01/02/22 0029 01/03/22 1858 01/03/22 2119 01/04/22 0152 01/04/22 0513  PROCALCITON  --   --  1.18  --  4.26  --   --   WBC 9.5 9.5  --   --  20.8* 18.8*  --   LATICACIDVEN  --   --   --  4.5*  --   --  1.9    Liver Function Tests: Recent Labs  Lab 12/21/2021 1813 01/02/22 0029 01/03/22 2119 01/04/22 0152  AST 50* 36 59* 69*  ALT '21 18 26 26  '$ ALKPHOS 131* 95 63 64  BILITOT 1.4* 1.4* 2.0* 2.1*  PROT 7.6 6.6 5.5* 5.9*  ALBUMIN 3.7 3.0* 2.3* 2.4*   No results for input(s): "LIPASE", "AMYLASE" in the last 168 hours. No results for input(s): "AMMONIA" in the last 168 hours.  ABG    Component Value Date/Time    PHART 7.432 01/03/2022 1741   PCO2ART 45.5 01/03/2022 1741   PO2ART 356 (H) 01/03/2022 1741   HCO3 30.5 (H) 01/03/2022 1741   TCO2 32 01/03/2022 1741   O2SAT 100 01/03/2022 1741     Coagulation Profile: Recent Labs  Lab 01/03/22 2119  INR 3.8*    Cardiac  Enzymes: No results for input(s): "CKTOTAL", "CKMB", "CKMBINDEX", "TROPONINI" in the last 168 hours.  HbA1C: Hgb A1c MFr Bld  Date/Time Value Ref Range Status  01/03/2022 09:18 PM 4.9 4.8 - 5.6 % Final    Comment:    (NOTE) Pre diabetes:          5.7%-6.4%  Diabetes:              >6.4%  Glycemic control for   <7.0% adults with diabetes     CBG: Recent Labs  Lab 01/03/22 1946 01/03/22 2354 01/04/22 0330 01/04/22 0722  GLUCAP 166* 123* 114* 104*    Review of Systems:   Intubated/sedated  Past Medical History:  She,  has a past medical history of Anemia, Anxiety, Arthritis, Asthma, Breast cancer (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   Surgical History:   Past Surgical History:  Procedure Laterality Date   brachial skin removal due to deforimity Bilateral    BREAST ENHANCEMENT SURGERY Left 11/14/2018   Procedure: LEFT BREAST AUGMENTATION;  Surgeon: [name], MD;  Location: [place];  Service: Plastics;  Laterality: Left;   BREAST RECONSTRUCTION Right 06/26/2018   BREAST RECONSTRUCTION WITH PLACEMENT OF TISSUE EXPANDER AND ALLODERM Right 06/26/2018   Procedure: RIGHT BREAST RECONSTRUCTION WITH PLACEMENT OF TISSUE EXPANDER;  Surgeon: [name], MD;  Location: [place];  Service: Plastics;  Laterality: Right;   BREAST RECONSTRUCTION  WITH PLACEMENT OF TISSUE EXPANDER AND FLEX HD (ACELLULAR HYDRATED DERMIS) Right 01/28/2017    RIGHT BREAST RECONSTRUCTION WITH PLACEMENT OF TISSUE EXPANDER AND ALLODERM;  Surgeon: [name], MD;  Location: [place];  Service: Plastics;  Laterality: Right;   BUNIONECTOMY     left    CATARACT EXTRACTION     bilaterla cataract surgery    COLONOSCOPY W/ POLYPECTOMY     JOINT REPLACEMENT     KNEE ARTHROSCOPY     left    LATISSIMUS FLAP TO BREAST Right 06/26/2018   Procedure: LATISSIMUS FLAP TO RIGHT CHEST;  Surgeon: [name], MD;  Location: [place];  Service: Plastics;  Laterality: Right;   MASTECTOMY Right    MASTECTOMY W/ SENTINEL NODE BIOPSY Right 01/28/2017   Procedure: RIGHT TOTAL MASTECTOMY WITH SENTINEL LYMPH NODE BIOPSY;  Surgeon: Excell Seltzer, MD;  Location: [place];  Service: General;  Laterality: Right;   MASTOPEXY Left 11/14/2018   Procedure: LEFT BREAST MASTOPEXY;  Surgeon: [name], MD;  Location: [place];  Service: Plastics;  Laterality: Left;   MELANOMA EXCISION Left    polyp removed from uterus      REMOVAL OF TISSUE EXPANDER AND PLACEMENT OF IMPLANT Right 11/14/2018   Procedure: REMOVAL OF RIGHT TISSUE EXPANDER AND PLACEMENT OF SALINE IMPLANT;  Surgeon: [name], MD;  Location: [place];  Service: Plastics;  Laterality: Right;   right wrist pinning      skin cancers removed     TISSUE EXPANDER PLACEMENT Right 02/27/2018   Procedure: REMOVAL OF TISSUE EXPANDER;  Surgeon: [name], MD;  Location: [place];  Service: Plastics;  Laterality: Right;   TOTAL KNEE ARTHROPLASTY Right 01/29/2013   Procedure: RIGHT TOTAL KNEE ARTHROPLASTY;  Surgeon: Gearlean Alf, MD;  [facility];  Service: Orthopedics;  Laterality: Right;   TOTAL KNEE  ARTHROPLASTY Left 08/29/2017   Procedure: LEFT TOTAL KNEE ARTHROPLASTY;  Surgeon: Gaynelle Arabian, MD;  Location: [facility];  Service: Orthopedics;  Laterality: Left;     Social  History:   reports that she has never smoked. She has never used smokeless tobacco. She reports current alcohol use of about 1.0 standard drink of alcohol per week. She reports that she does not use drugs.   Family History:  Her family history includes Breast cancer in an other family member; COPD in her father; Hypertension in an other family member.   Allergies Allergies  Allergen Reactions   Chlorhexidine Gluconate Hives, Swelling and Rash       "ChloraPrep "   Doxycycline Nausea And Vomiting   Codeine Nausea Only and Other (See Comments)    Pt can tolerate when pre-medicated   Doxycycline Nausea And Vomiting   Nsaids     avoid due to gerd   Nsaids Other (See Comments)    Gi bleed   Tylenol [Acetaminophen]     Avoid due to GERD   Adhesive [Tape] Rash    Dermabond   Chlorhexidine Swelling and Rash   Codeine Nausea Only   Tylenol [Acetaminophen] Other (See Comments)    GI bleed     Home Medications  Prior to Admission medications   Medication Sig Start Date End Date Taking? Authorizing Provider  albuterol (VENTOLIN HFA) 108 (90 Base) MCG/ACT inhaler Inhale 1 puff into the lungs every 6 (six) hours as needed for wheezing or shortness of breath.   Yes [provider]  ALPRAZolam Duanne Moron) 0.5 MG tablet Take 0.5 mg by mouth daily as needed for anxiety.    Yes [provider]  B Complex Vitamins (VITAMIN B COMPLEX PO) Take 1 capsule by mouth daily.   Yes [provider]  BREO ELLIPTA 100-25 MCG/INH AEPB Inhale 1 puff into the lungs daily. 12/22/16  Yes [provider]  COLLAGEN PO Take 1 capsule by mouth daily.   Yes [provider]  desonide (DESOWEN) 0.05 % cream Apply 1 application topically 2 (two) times daily as needed (for skin irritation.).   Yes [provider]  diclofenac sodium (VOLTAREN) 1 % GEL Apply 2 g topically daily as needed (pain).    Yes [provider]  EPINEPHrine 0.3 mg/0.3 mL IJ SOAJ injection  Inject 0.3 mg into the muscle as needed for anaphylaxis.   Yes [provider]  famotidine (PEPCID) 20 MG tablet Take 1 tablet by mouth as directed. 06/29/19  Yes [provider]  fluorouracil (EFUDEX) 5 % cream Apply 1 application  topically daily. Per MD instructions, for skin cancer 12/03/20  Yes [provider]  fluticasone (FLONASE) 50 MCG/ACT nasal spray Place 2 sprays into both nostrils daily.   Yes [provider]  irbesartan (AVAPRO) 150 MG tablet Take 1 tablet (150 mg total) by mouth daily. 04/10/21  Yes Lelon Perla, MD  letrozole [facility]) 2.5 MG tablet TAKE 1 TABLET BY MOUTH EVERY DAY 05/29/21  Yes Magrinat, Virgie Dad, MD  levocetirizine (XYZAL) 5 MG tablet Take 5 mg by mouth at bedtime. 09/04/20  Yes [provider]  Methylsulfonylmethane (MSM PO) Take 1 capsule by mouth daily.   Yes [provider]  MILK THISTLE PO Take 1 capsule by mouth daily.   Yes [provider]  Misc Natural Products (PYCNOGENOL COMPLEX PO) Take 1 capsule by mouth daily.   Yes [provider]  Omega-3 Fatty Acids (OMEGA 3 PO) Take 1  capsule by mouth daily.   Yes [provider]  OVER THE COUNTER MEDICATION Take 1 capsule by mouth daily. Immunity supplement   Yes [provider]  oxyCODONE (ROXICODONE) 5 MG immediate release tablet Take 1 tablet (5 mg total) by mouth every 4 (four) hours as needed for severe pain. 09/02/21  Yes Gareth Morgan, MD  Polyethyl Glycol-Propyl Glycol (SYSTANE OP) Place 1 drop into both eyes daily.   Yes [provider]  potassium chloride (KLOR-CON) 20 MEQ packet Take 20 mEq by mouth daily.   Yes [provider]  propranolol ER (INDERAL LA) 80 MG 24 hr capsule Take 80 mg by mouth daily.   Yes [provider]  TURMERIC PO Take 1 capsule by mouth daily.   Yes [provider]  venlafaxine XR (EFFEXOR-XR) 75 MG 24 hr capsule TAKE 1 CAPSULE BY MOUTH DAILY WITH  BREAKFAST. 12/30/21  Yes Iruku, Arletha Pili, MD  VITAMIN D PO Take 1 capsule by mouth daily.   Yes [provider]  XARELTO 20 MG TABS tablet TAKE 1 TABLET BY MOUTH DAILY WITH SUPPER Patient taking differently: Take 20 mg by mouth daily. 09/29/21  Yes Vickie Epley, MD  furosemide (LASIX) 20 MG tablet Take 1 tablet (20 mg total) by mouth daily as needed. Patient not taking: Reported on 01/02/2022 11/18/21   Deberah Pelton, NP     Critical care time: 44 min i   Magdalen Spatz, MSN, AGACNP-BC [place] for personal pager PCCM on call pager [phone]  01/04/2022 11:05 AM

## 2022-01-05 ENCOUNTER — Inpatient Hospital Stay (HOSPITAL_COMMUNITY): Payer: Medicare Other

## 2022-01-05 DIAGNOSIS — I5181 Takotsubo syndrome: Secondary | ICD-10-CM | POA: Diagnosis not present

## 2022-01-05 DIAGNOSIS — J189 Pneumonia, unspecified organism: Principal | ICD-10-CM

## 2022-01-05 DIAGNOSIS — J9601 Acute respiratory failure with hypoxia: Secondary | ICD-10-CM

## 2022-01-05 DIAGNOSIS — I5021 Acute systolic (congestive) heart failure: Secondary | ICD-10-CM | POA: Diagnosis not present

## 2022-01-05 DIAGNOSIS — I482 Chronic atrial fibrillation, unspecified: Secondary | ICD-10-CM | POA: Diagnosis not present

## 2022-01-05 DIAGNOSIS — I469 Cardiac arrest, cause unspecified: Secondary | ICD-10-CM

## 2022-01-05 LAB — COMPREHENSIVE METABOLIC PANEL
ALT: 21 U/L (ref 0–44)
AST: 25 U/L (ref 15–41)
Albumin: 2.1 g/dL — ABNORMAL LOW (ref 3.5–5.0)
Alkaline Phosphatase: 52 U/L (ref 38–126)
Anion gap: 10 (ref 5–15)
BUN: 15 mg/dL (ref 8–23)
CO2: 24 mmol/L (ref 22–32)
Calcium: 9 mg/dL (ref 8.9–10.3)
Chloride: 96 mmol/L — ABNORMAL LOW (ref 98–111)
Creatinine, Ser: 0.66 mg/dL (ref 0.44–1.00)
GFR, Estimated: >60 mL/min (ref >60)
Glucose, Bld: 192 mg/dL — ABNORMAL HIGH (ref 70–99)
Potassium: 3.3 mmol/L — ABNORMAL LOW (ref 3.5–5.1)
Sodium: 130 mmol/L — ABNORMAL LOW (ref 135–145)
Total Bilirubin: 1.3 mg/dL — ABNORMAL HIGH (ref 0.3–1.2)
Total Protein: 5.3 g/dL — ABNORMAL LOW (ref 6.5–8.1)

## 2022-01-05 LAB — GLUCOSE, CAPILLARY
Glucose-Capillary: 172 mg/dL — ABNORMAL HIGH (ref 70–99)
Glucose-Capillary: 221 mg/dL — ABNORMAL HIGH (ref 70–99)
Glucose-Capillary: 166 mg/dL — ABNORMAL HIGH (ref 70–99)
Glucose-Capillary: 178 mg/dL — ABNORMAL HIGH (ref 70–99)
Glucose-Capillary: 172 mg/dL — ABNORMAL HIGH (ref 70–99)

## 2022-01-05 LAB — CBC WITH DIFFERENTIAL/PLATELET
Abs Immature Granulocytes: 0.09 10*3/uL — ABNORMAL HIGH (ref 0.00–0.07)
Basophils Absolute: 0.1 10*3/uL (ref 0.0–0.1)
Basophils Relative: 0 %
Eosinophils Absolute: 0.1 10*3/uL (ref 0.0–0.5)
Eosinophils Relative: 0 %
HCT: 31.1 % — ABNORMAL LOW (ref 36.0–46.0)
Hemoglobin: 11.1 g/dL — ABNORMAL LOW (ref 12.0–15.0)
Immature Granulocytes: 1 %
Lymphocytes Relative: 4 %
Lymphs Abs: 0.7 10*3/uL (ref 0.7–4.0)
MCH: 36.2 pg — ABNORMAL HIGH (ref 26.0–34.0)
MCHC: 35.7 g/dL (ref 30.0–36.0)
MCV: 101.3 fL — ABNORMAL HIGH (ref 80.0–100.0)
Monocytes Absolute: 1.3 10*3/uL — ABNORMAL HIGH (ref 0.1–1.0)
Monocytes Relative: 8 %
Neutro Abs: 13.5 10*3/uL — ABNORMAL HIGH (ref 1.7–7.7)
Neutrophils Relative %: 87 %
Platelets: 140 10*3/uL — ABNORMAL LOW (ref 150–400)
RBC: 3.07 MIL/uL — ABNORMAL LOW (ref 3.87–5.11)
RDW: 12.8 % (ref 11.5–15.5)
WBC: 15.7 10*3/uL — ABNORMAL HIGH (ref 4.0–10.5)
nRBC: 0 % (ref 0.0–0.2)

## 2022-01-05 LAB — CULTURE, RESPIRATORY W GRAM STAIN
Culture: NO GROWTH
Gram Stain: NONE SEEN

## 2022-01-05 LAB — LEGIONELLA PNEUMOPHILA SEROGP 1 UR AG
L. pneumophila Serogp 1 Ur Ag: NEGATIVE
L. pneumophila Serogp 1 Ur Ag: NEGATIVE

## 2022-01-05 LAB — APTT
aPTT: 119 seconds — ABNORMAL HIGH (ref 24–36)
aPTT: 63 seconds — ABNORMAL HIGH (ref 24–36)
aPTT: 68 seconds — ABNORMAL HIGH (ref 24–36)

## 2022-01-05 LAB — HEPARIN LEVEL (UNFRACTIONATED)
Heparin Unfractionated: >1.1 IU/mL — ABNORMAL HIGH (ref 0.30–0.70)
Heparin Unfractionated: >1.1 IU/mL — ABNORMAL HIGH (ref 0.30–0.70)

## 2022-01-05 LAB — MAGNESIUM
Magnesium: 1.6 mg/dL — ABNORMAL LOW (ref 1.7–2.4)
Magnesium: 2.5 mg/dL — ABNORMAL HIGH (ref 1.7–2.4)

## 2022-01-05 LAB — PHOSPHORUS
Phosphorus: 1.9 mg/dL — ABNORMAL LOW (ref 2.5–4.6)
Phosphorus: 3.4 mg/dL (ref 2.5–4.6)

## 2022-01-05 LAB — TROPONIN I (HIGH SENSITIVITY): Troponin I (High Sensitivity): 55 ng/L — ABNORMAL HIGH (ref <18)

## 2022-01-05 LAB — MRSA NEXT GEN BY PCR, NASAL: MRSA by PCR Next Gen: NEGATIVE — AB

## 2022-01-05 MED ORDER — DEXMEDETOMIDINE HCL IN NACL 400 MCG/100ML IV SOLN
0.0000 ug/kg/h | INTRAVENOUS | Status: DC
  Administered 2022-01-05 (×2): 1.2 ug/kg/h via INTRAVENOUS
  Administered 2022-01-06: 1 ug/kg/h via INTRAVENOUS
  Administered 2022-01-06: 0.6 ug/kg/h via INTRAVENOUS
  Filled 2022-01-05 (×4): qty 100

## 2022-01-05 MED ORDER — FENTANYL BOLUS VIA INFUSION
25.0000 ug | INTRAVENOUS | Status: DC | PRN
  Administered 2022-01-05: 25 ug via INTRAVENOUS

## 2022-01-05 MED ORDER — DEXMEDETOMIDINE HCL IN NACL 400 MCG/100ML IV SOLN
INTRAVENOUS | Status: AC
  Administered 2022-01-05: 0.4 ug/kg/h via INTRAVENOUS
  Filled 2022-01-05: qty 100

## 2022-01-05 MED ORDER — FENTANYL 2500MCG IN NS 250ML (10MCG/ML) PREMIX INFUSION
0.0000 ug/h | INTRAVENOUS | Status: DC
  Administered 2022-01-05: 50 ug/h via INTRAVENOUS
  Filled 2022-01-05: qty 250

## 2022-01-05 MED ORDER — METHYLPREDNISOLONE SODIUM SUCC 40 MG IJ SOLR
40.0000 mg | INTRAMUSCULAR | Status: AC
  Administered 2022-01-06 – 2022-01-07 (×2): 40 mg via INTRAVENOUS
  Filled 2022-01-05 (×2): qty 1

## 2022-01-05 MED ORDER — QUETIAPINE FUMARATE 25 MG PO TABS
25.0000 mg | ORAL_TABLET | Freq: Once | ORAL | Status: AC
  Administered 2022-01-05: 25 mg
  Filled 2022-01-05: qty 1

## 2022-01-05 MED ORDER — DEXMEDETOMIDINE HCL IN NACL 400 MCG/100ML IV SOLN: 0.4000 ug/kg/h | INTRAVENOUS | Status: DC

## 2022-01-05 MED ORDER — MAGNESIUM SULFATE 4 GM/100ML IV SOLN
4.0000 g | Freq: Once | INTRAVENOUS | Status: AC
  Administered 2022-01-05: 4 g via INTRAVENOUS
  Filled 2022-01-05: qty 100

## 2022-01-05 MED ORDER — FENTANYL 2500MCG IN NS 250ML (10MCG/ML) PREMIX INFUSION: 25.0000 ug/h | INTRAVENOUS | Status: DC

## 2022-01-05 MED ORDER — FENTANYL CITRATE PF 50 MCG/ML IJ SOSY
25.0000 ug | PREFILLED_SYRINGE | INTRAMUSCULAR | Status: DC | PRN
  Administered 2022-01-05: 50 ug via INTRAVENOUS
  Filled 2022-01-05: qty 1

## 2022-01-05 MED ORDER — POTASSIUM PHOSPHATES 15 MMOLE/5ML IV SOLN
30.0000 mmol | Freq: Once | INTRAVENOUS | Status: AC
  Administered 2022-01-05: 30 mmol via INTRAVENOUS
  Filled 2022-01-05: qty 10

## 2022-01-05 NOTE — Progress Notes (Signed)
[facility] ADULT ICU REPLACEMENT PROTOCOL   The patient does apply for the [facility] Adult ICU Electrolyte Replacment Protocol based on the criteria listed below:   1.Exclusion criteria: TCTS patients, ECMO patients, and Dialysis patients 2. Is GFR >/= 30 ml/min? Yes.    Patient's GFR today is >60 3. Is SCr </= 2? Yes.   Patient's SCr is 0.66 mg/dL 4. Did SCr increase >/= 0.5 in 24 hours? No. 5.Pt's weight >40kg  Yes.   6. Abnormal electrolyte(s): Phos 1.9, K+ 3.3, Mag 1.6  7. Electrolytes replaced per protocol 8.  Call MD STAT for K+ </= 2.5, Phos </= 1, or Mag </= 1 Physician:  Dr Mauri [place]      I personally spoke with Shirlee Limerick (Pharmacist regarding [place] with approval    Carlisle Beers 01/05/2022 2:25 AM

## 2022-01-05 NOTE — Progress Notes (Signed)
ANTICOAGULATION CONSULT NOTE   Pharmacy Consult for heparin Indication: atrial fibrillation  Allergies  Allergen Reactions   Chlorhexidine Gluconate Hives, Swelling and Rash       "ChloraPrep "   Doxycycline Nausea And Vomiting   Codeine Nausea Only and Other (See Comments)    Pt can tolerate when pre-medicated   Doxycycline Nausea And Vomiting   Nsaids     avoid due to gerd   Nsaids Other (See Comments)    Gi bleed   Tylenol [Acetaminophen]     Avoid due to GERD   Adhesive [Tape] Rash    Dermabond   Chlorhexidine Swelling and Rash   Codeine Nausea Only   Tylenol [Acetaminophen] Other (See Comments)    GI bleed    Patient Measurements: Height: 5' (152.4 cm) Weight: 66.8 kg (147 lb 4.3 oz) IBW/kg (Calculated) : 45.5 Heparin Dosing Weight: 60 kg   Vital Signs: Temp: 99.3 F (37.4 C) (08/01 1300) Temp Source: Bladder (08/01 1025) BP: 112/67 (08/01 1300) Pulse Rate: 72 (08/01 1300)  Labs: Recent Labs    01/03/22 2119 01/03/22 2120 01/04/22 0152 01/04/22 0513 01/04/22 1014 01/04/22 1558 01/04/22 1808 01/05/22 0000 01/05/22 0105 01/05/22 0502 01/05/22 1030 01/05/22 1236  HGB 11.8*  --  12.8  --   --   --   --   --  11.1*  --   --   --   HCT 31.8*  --  34.7*  --   --   --   --   --  31.1*  --   --   --   PLT 145*  --  146*  --   --   --   --   --  140*  --   --   --   APTT 39*  --   --   --   --   --   --  119*  --   --   --  63*  LABPROT 37.3*  --   --   --   --   --   --   --   --   --   --   --   INR 3.8*  --   --   --   --   --   --   --   --   --   --   --   HEPARINUNFRC  --  >1.10*  --   --   --   --   --  >1.10*  --   --  >1.10*  --   CREATININE 0.68  --  0.73 0.62   < > 0.66 0.67  --  0.66  --   --   --   TROPONINIHS 123*  --   --  131*  --   --   --   --   --  55*  --   --    < > = values in this interval not displayed.     Estimated Creatinine Clearance: 49.4 mL/min (by C-G formula based on SCr of 0.66 mg/dL).   Medical History: Past Medical  History:  Diagnosis Date   Anemia    history of   Anxiety    Arthritis    Asthma    Breast cancer ([place])    Cancer ([place])    skin cancers, one melanoma    Complication of anesthesia    patient woke up during colonoscopy   Concussion    8 yrs. ago  due to being thrown from a horse   Dislocated elbow 10/12/2016   left   Family history of breast cancer    GERD (gastroesophageal reflux disease)    History of kidney stones    History of radiation therapy 03/23/17-05/04/17   right chest wall 50.4 Gy in 28 fractions, right axilla 45 Gy in 25 fractions   Hypertension    Hypertensive heart disease without CHF    Keratosis, actinic    Melanoma ([facility])    Persistent atrial fibrillation ([facility]) 12/16/2016   CHA2DS2VASC score 3   Pneumonia    hx of     Medications:  Scheduled:   arformoterol  15 mcg Nebulization BID   budesonide (PULMICORT) nebulizer solution  0.25 mg Nebulization BID   insulin aspart  0-9 Units Subcutaneous Q4H   [START ON 01/06/2022] methylPREDNISolone (SOLU-MEDROL) injection  40 mg Intravenous Q24H   midazolam  2 mg Intravenous Once   mouth rinse  15 mL Mouth Rinse Q2H   pantoprazole (PROTONIX) IV  40 mg Intravenous QHS   polyethylene glycol  17 g Per Tube Daily   revefenacin  175 mcg Nebulization Daily    Assessment: 75 yof presenting with acute hypoxemia related to COPD excerbation/PNA complicated by PEA arrest  - on Xarelto PTA for Afib (LD 7/29).   Heparin level still remains elevated at >1.1, aPTT slightly subtherapeutic at 63, on 700 units/hr. No s/sx of bleeding or infusion issues.  Given recent DOAC will monitor both aPTT and heparin level until correlate.   Goal of Therapy:  Heparin level 0.3-0.7 units/ml aPTT 66-102 seconds Monitor platelets by anticoagulation protocol: Yes   Plan:  Increase heparin to 750 units/hr Order heparin level in 6 hours  Monitor heparin level, CBC, and for s/sx of bleeding   Antonietta Jewel, PharmD, BCCCP Clinical  Pharmacist  Phone: [phone] 01/05/2022 1:50 PM  Please check AMION for all [place] phone numbers After 10:00 PM, call [place] [phone]

## 2022-01-05 NOTE — Progress Notes (Signed)
[place] Progress Note Patient Name: [name] DOB: 09-09-1940 MRN: [id number]   Date of Service  01/05/2022  HPI/Events of Note  Patient with sub-optimal sedation on the ventilator, she is at risk for self-extubation, Fentanyl gtt was discontinued earlier today in anticipation of extubation, but she did not meet criteria.  eICU Interventions  PRN iv Fentanyl order entered.        Frederik Pear 01/05/2022, 9:34 PM

## 2022-01-05 NOTE — Progress Notes (Signed)
[place] for heparin Indication: atrial fibrillation  Allergies  Allergen Reactions   Chlorhexidine Gluconate Hives, Swelling and Rash       "ChloraPrep "   Doxycycline Nausea And Vomiting   Codeine Nausea Only and Other (See Comments)    Pt can tolerate when pre-medicated   Doxycycline Nausea And Vomiting   Nsaids     avoid due to gerd   Nsaids Other (See Comments)    Gi bleed   Tylenol [Acetaminophen]     Avoid due to GERD   Adhesive [Tape] Rash    Dermabond   Chlorhexidine Swelling and Rash   Codeine Nausea Only   Tylenol [Acetaminophen] Other (See Comments)    GI bleed    Patient Measurements: Height: 5' (152.4 cm) Weight: 67.4 kg (148 lb 9.4 oz) IBW/kg (Calculated) : 45.5 Heparin Dosing Weight: 60 kg   Vital Signs: Temp: 97.5 F (36.4 C) (07/31 2329) Temp Source: Bladder (07/31 2000) BP: 105/63 (07/31 2329) Pulse Rate: 64 (07/31 2329)  Labs: Recent Labs    01/02/22 1308 01/03/22 0735 01/03/22 2119 01/03/22 2120 01/04/22 0152 01/04/22 0513 01/04/22 1014 01/04/22 1558 01/04/22 1808 01/05/22 0000 01/05/22 0105  HGB  --    < > 11.8*  --  12.8  --   --   --   --   --  11.1*  HCT  --    < > 31.8*  --  34.7*  --   --   --   --   --  31.1*  PLT  --   --  145*  --  146*  --   --   --   --   --  140*  APTT  --   --  39*  --   --   --   --   --   --  119*  --   LABPROT  --   --  37.3*  --   --   --   --   --   --   --   --   INR  --   --  3.8*  --   --   --   --   --   --   --   --   HEPARINUNFRC  --   --   --  >1.10*  --   --   --   --   --  >1.10*  --   CREATININE  --    < > 0.68  --  0.73 0.62   < > 0.66 0.67  --  0.66  TROPONINIHS 11  --  123*  --   --  131*  --   --   --   --   --    < > = values in this interval not displayed.     Estimated Creatinine Clearance: 49.7 mL/min (by C-G formula based on SCr of 0.66 mg/dL).   Medical History: Past Medical History:  Diagnosis Date   Anemia    history of    Anxiety    Arthritis    Asthma    Breast cancer ([place])    Cancer ([place])    skin cancers, one melanoma    Complication of anesthesia    patient woke up during colonoscopy   Concussion    8 yrs. ago due to being thrown from a horse   Dislocated elbow 10/12/2016   left   Family history of breast cancer  GERD (gastroesophageal reflux disease)    History of kidney stones    History of radiation therapy 03/23/17-05/04/17   right chest wall 50.4 Gy in 28 fractions, right axilla 45 Gy in 25 fractions   Hypertension    Hypertensive heart disease without CHF    Keratosis, actinic    Melanoma ([place])    Persistent atrial fibrillation ([facility]) 12/16/2016   CHA2DS2VASC score 3   Pneumonia    hx of     Medications:  Scheduled:   arformoterol  15 mcg Nebulization BID   budesonide (PULMICORT) nebulizer solution  0.25 mg Nebulization BID   docusate  100 mg Per Tube BID   fentaNYL (SUBLIMAZE) injection  25 mcg Intravenous Once   insulin aspart  0-9 Units Subcutaneous Q4H   methylPREDNISolone (SOLU-MEDROL) injection  40 mg Intravenous Q12H   midazolam  2 mg Intravenous Once   mouth rinse  15 mL Mouth Rinse Q2H   pantoprazole (PROTONIX) IV  40 mg Intravenous QHS   polyethylene glycol  17 g Per Tube Daily   revefenacin  175 mcg Nebulization Daily    Assessment: 32 yof presenting with acute hypoxemia related to COPD excerbation/PNA complicated by PEA arrest  - on Xarelto PTA for Afib (LD 7/29).   Hgb 12.8, plt 146. No s/sx of bleeding. Now s/p thoracentesis - will start heparin while Xarelto on hold.   Given recent DOAC will monitor both aPTT and heparin level until correlate.   8/1 AM update:  aPTT elevated  Goal of Therapy:  Heparin level 0.3-0.7 units/ml aPTT 66-102 seconds Monitor platelets by anticoagulation protocol: Yes   Plan:  Dec heparin to 700 units/hr 1100 aPTT and heparin level  Narda Bonds, PharmD, BCPS Clinical Pharmacist Phone: [phone]

## 2022-01-05 NOTE — Inpatient Diabetes Management (Signed)
Inpatient Diabetes Program Recommendations  AACE/ADA: New Consensus Statement on Inpatient Glycemic Control (2015)  Target Ranges:  Prepandial:   less than 140 mg/dL      Peak postprandial:   less than 180 mg/dL (1-2 hours)      Critically ill patients:  140 - 180 mg/dL    Latest Reference Range & Units 01/03/22 23:54 01/04/22 03:30 01/04/22 07:22 01/04/22 11:26 01/04/22 16:28 01/04/22 19:43  Glucose-Capillary 70 - 99 mg/dL 123 (H) 114 (H) 104 (H) 128 (H) 146 (H) 110 (H)  (H): Data is abnormally high  Latest Reference Range & Units 01/04/22 23:29 01/05/22 03:14 01/05/22 07:43  Glucose-Capillary 70 - 99 mg/dL 200 (H) 172 (H) 221 (H)  (H): Data is abnormally high   Admit with: Sepsis secondary to community-acquired pneumonia, POA Acute hypoxic respiratory failure secondary to community-acquired pneumonia    Current Orders: Novolog Sensitive Correction Scale/ SSI (0-9 units) Q4 hours     MD- Note pt getting Solumedrol 40 mg daily.  Note that tube feeds were started yesterday around 4pm.  Rate not yet at goal of 55cc/hr.  Note rise in CBGs since introduction of the tube feeds.  When tube feeds reach goal rate 55cc/hr, please consider starting low dose Novolog Tube Feed Coverage:  Novolog 3 units Q4 hours HOLD if tube feeds HELD for any reason    --Will follow patient during hospitalization--  Wyn Quaker RN, MSN, [facility] Coordinator Inpatient Glycemic Control Team Team Pager: [phone] (8a-5p)

## 2022-01-05 NOTE — Progress Notes (Signed)
Progress Note  Patient Name: [name] Date of Encounter: 01/05/2022  Primary Cardiologist:   Kirk Ruths, MD   Subjective   Awake but confused.  Still ventilated.  Pressors almost off.   Inpatient Medications    Scheduled Meds:  arformoterol  15 mcg Nebulization BID   budesonide (PULMICORT) nebulizer solution  0.25 mg Nebulization BID   docusate  100 mg Per Tube BID   fentaNYL (SUBLIMAZE) injection  25 mcg Intravenous Once   insulin aspart  0-9 Units Subcutaneous Q4H   methylPREDNISolone (SOLU-MEDROL) injection  40 mg Intravenous Q12H   midazolam  2 mg Intravenous Once   mouth rinse  15 mL Mouth Rinse Q2H   pantoprazole (PROTONIX) IV  40 mg Intravenous QHS   polyethylene glycol  17 g Per Tube Daily   revefenacin  175 mcg Nebulization Daily   Continuous Infusions:  azithromycin (ZITHROMAX) 500 mg in sodium chloride 0.9 % 250 mL IVPB Stopped (01/04/22 2336)   ceFEPime (MAXIPIME) IV Stopped (01/04/22 2229)   feeding supplement (VITAL AF 1.2 CAL) 40 mL/hr at 01/05/22 0804   fentaNYL infusion INTRAVENOUS 100 mcg/hr (01/05/22 0700)   heparin 700 Units/hr (01/05/22 0700)   norepinephrine (LEVOPHED) Adult infusion 3 mcg/min (01/05/22 0700)   potassium PHOSPHATE IVPB (in mmol) 85 mL/hr at 01/05/22 0700   propofol (DIPRIVAN) infusion Stopped (01/05/22 0434)   vancomycin Stopped (01/04/22 2025)   vasopressin Stopped (01/03/22 2119)   PRN Meds: acetaminophen, fentaNYL, melatonin, mouth rinse, polyethylene glycol   Vital Signs    Vitals:   01/05/22 0730 01/05/22 0745 01/05/22 0749 01/05/22 0800  BP: 98/74 98/67    Pulse: (!) 57 62 60 65  Resp: (!) 28 (!) 28 (!) 28 (!) 28  Temp: 98.4 F (36.9 C) 98.6 F (37 C)  98.6 F (37 C)  TempSrc:      SpO2: 99% 95% 98% 99%  Weight:      Height:        Intake/Output Summary (Last 24 hours) at 01/05/2022 0813 Last data filed at 01/05/2022 0804 Gross per 24 hour  Intake 2267.23 ml  Output 1852 ml  Net 415.23 ml   Filed  Weights   01/03/22 0630 01/04/22 0500 01/05/22 0446  Weight: 65.2 kg 67.4 kg 66.8 kg    Telemetry    Atrial fib with controlled ventricular rate - Personally Reviewed  ECG    NA - Personally Reviewed  Physical Exam   GEN: No  acute distress.   Neck: No  JVD Cardiac: Irregular RR, no murmurs, rubs, or gallops.  (Distant heart sounds) Respiratory: Decreased breath sounds bilaterally.  GI: Soft, nontender, non-distended, decreased bowel sounds  MS:  Trace diffuse edema; No deformity. Neuro:   Nonfocal  Psych: Unable to assess.    Labs    Chemistry Recent Labs  Lab 01/03/22 2119 01/04/22 0152 01/04/22 0513 01/04/22 1558 01/04/22 1808 01/05/22 0105  NA 123* 127*   < > 128* 129* 130*  K 2.6* 4.2   < > 3.6 3.4* 3.3*  CL 85* 90*   < > 93* 93* 96*  CO2 26 25   < > '24 25 24  '$ GLUCOSE 165* 151*   < > 147* 143* 192*  BUN 13 15   < > '13 15 15  '$ CREATININE 0.68 0.73   < > 0.66 0.67 0.66  CALCIUM 8.8* 9.0   < > 9.2 9.1 9.0  PROT 5.5* 5.9*  --   --  5.6* 5.3*  ALBUMIN  2.3* 2.4*  --   --   --  2.1*  AST 59* 69*  --   --   --  25  ALT 26 26  --   --   --  21  ALKPHOS 63 64  --   --   --  52  BILITOT 2.0* 2.1*  --   --   --  1.3*  GFRNONAA >60 >60   < > >60 >60 >60  ANIONGAP 12 12   < > '11 11 10   '$ < > = values in this interval not displayed.     Hematology Recent Labs  Lab 01/03/22 2119 01/04/22 0152 01/05/22 0105  WBC 20.8* 18.8* 15.7*  RBC 3.18* 3.48* 3.07*  HGB 11.8* 12.8 11.1*  HCT 31.8* 34.7* 31.1*  MCV 100.0 99.7 101.3*  MCH 37.1* 36.8* 36.2*  MCHC 37.1* 36.9* 35.7  RDW 12.2 12.4 12.8  PLT 145* 146* 140*    Cardiac EnzymesNo results for input(s): "TROPONINI" in the last 168 hours. No results for input(s): "TROPIPOC" in the last 168 hours.   BNP Recent Labs  Lab 12/11/2021 1813 01/02/2022 2249  BNP 311.6* 479.9*     DDimer  Recent Labs  Lab 01/02/22 1308  DDIMER 1.11*     Radiology    DG CHEST PORT 1 VIEW  Result Date: 01/04/2022 CLINICAL DATA:   Respiratory distress EXAM: PORTABLE CHEST 1 VIEW COMPARISON:  Chest x-ray 01/04/2022.  Chest CT 01/03/2022. FINDINGS: Endotracheal tube tip is 3.5 cm above the carina. Left-sided central venous catheter tip projects over the distal SVC. New pigtail catheter overlies the right chest. The cardiomediastinal silhouette is within normal limits. Right pleural effusion has decreased. Small right pleural effusion persists. No evidence for pneumothorax or mediastinal shift. Patchy opacities in the lower lungs are present, right greater than left. Osseous structures are stable. IMPRESSION: 1. New right-sided pigtail catheter. 2. Right pleural effusion has significantly decreased. Small right pleural effusion persists. 3. Bibasilar atelectasis/airspace disease. Electronically Signed   By: Ronney Asters M.D.   On: 01/04/2022 15:56   ECHOCARDIOGRAM COMPLETE  Result Date: 01/04/2022    ECHOCARDIOGRAM REPORT   Patient Name:   [name] Date of Exam: 01/04/2022 Medical Rec #:  [id number]       Height:       60.0 in Accession #:    [id number]      Weight:       148.6 lb Date of Birth:  11/01/1940       BSA:          1.645 m Patient Age:    81 years        BP:           120/68 mmHg Patient Gender: F               HR:           71 bpm. Exam Location:  Inpatient Procedure: 2D Echo, Cardiac Doppler, Color Doppler and Intracardiac            Opacification Agent STAT ECHO Indications:    Cardiac arrest I46.9  History:        Patient has prior history of Echocardiogram examinations, most                 recent 12/29/2020. Arrythmias:Atrial Fibrillation; Risk                 Factors:Hypertension and GERD.  Sonographer:    Bernadene Person  RDCS Referring Phys: [id number] Candee Furbish  Sonographer Comments: Echo performed with patient supine and on artificial respirator. IMPRESSIONS  1. All mid-to-apical LV segments are akinetic. The basal LV segments demonstrate normal contractility. Findings most consistent with Takostubo  cardiomyopathy . No LV thrombus visualized.  2. Left ventricular ejection fraction, by estimation, is 25 to 30%. The left ventricle has severely decreased function. The left ventricle demonstrates regional wall motion abnormalities (see scoring diagram/findings for description). Diastolic function  indeterminant due to Afib.  3. Right ventricular systolic function is mildly reduced. The right ventricular size is mildly enlarged. There is mildly elevated pulmonary artery systolic pressure. The estimated right ventricular systolic pressure is 20.2 mmHg.  4. Left atrial size was mildly dilated.  5. Right atrial size was moderately dilated.  6. The mitral valve is grossly normal. Moderate mitral valve regurgitation.  7. Tricuspid valve regurgitation is mild to moderate.  8. The aortic valve is tricuspid. There is mild calcification of the aortic valve. There is mild thickening of the aortic valve. Aortic valve regurgitation is mild to moderate. Aortic valve sclerosis/calcification is present, without any evidence of aortic stenosis. Comparison(s): Compared to prior TTE, the LVEF has dropped to 25% (previously 55%). Mitral regurgitation now appears moderate (likely functional) which was previously trivial. FINDINGS  Left Ventricle: All mid-to-apical LV segments are akinetic. The basal LV segments demonstrate normal contractility. Findings most consistent with Takostubo cardiomyopathy. Left ventricular ejection fraction, by estimation, is 25 to 30%. The left ventricle has severely decreased function. The left ventricle demonstrates regional wall motion abnormalities. Definity contrast agent was given IV to delineate the left ventricular endocardial borders. The left ventricular internal cavity size was normal in size. There is no left ventricular hypertrophy. Diastolic function indeterminant due to Afib. Right Ventricle: The right ventricular size is mildly enlarged. No increase in right ventricular wall thickness. Right  ventricular systolic function is mildly reduced. There is mildly elevated pulmonary artery systolic pressure. The tricuspid regurgitant  velocity is 3.03 m/s, and with an assumed right atrial pressure of 8 mmHg, the estimated right ventricular systolic pressure is 54.2 mmHg. Left Atrium: Left atrial size was mildly dilated. Right Atrium: Right atrial size was moderately dilated. Pericardium: Trivial pericardial effusion is present. The pericardial effusion is circumferential. Mitral Valve: The mitral valve is grossly normal. There is mild thickening of the mitral valve leaflet(s). There is mild calcification of the mitral valve leaflet(s). Moderate mitral valve regurgitation. Tricuspid Valve: The tricuspid valve is normal in structure. Tricuspid valve regurgitation is mild to moderate. Aortic Valve: The aortic valve is tricuspid. There is mild calcification of the aortic valve. There is mild thickening of the aortic valve. Aortic valve regurgitation is mild to moderate. Aortic regurgitation PHT measures 603 msec. Aortic valve sclerosis/calcification is present, without any evidence of aortic stenosis. Pulmonic Valve: The pulmonic valve was normal in structure. Pulmonic valve regurgitation is trivial. Aorta: The aortic root and ascending aorta are structurally normal, with no evidence of dilitation. Venous: IVC assessment for right atrial pressure unable to be performed due to mechanical ventilation. IAS/Shunts: The atrial septum is grossly normal.  LEFT VENTRICLE PLAX 2D LVIDd:         4.20 cm LVIDs:         3.10 cm LV PW:         1.10 cm LV IVS:        1.00 cm LVOT diam:     2.00 cm LV SV:  34 LV SV Index:   20 LVOT Area:     3.14 cm  LV Volumes (MOD) LV vol d, MOD A2C: 83.0 ml LV vol d, MOD A4C: 68.8 ml LV vol s, MOD A2C: 64.9 ml LV vol s, MOD A4C: 48.5 ml LV SV MOD A2C:     18.1 ml LV SV MOD A4C:     68.8 ml LV SV MOD BP:      19.4 ml RIGHT VENTRICLE RV S prime:     6.53 cm/s TAPSE (M-mode): 1.2 cm LEFT  ATRIUM             Index        RIGHT ATRIUM           Index LA diam:        4.50 cm 2.74 cm/m   RA Area:     23.30 cm LA Vol (A2C):   55.5 ml 33.74 ml/m  RA Volume:   73.20 ml  44.49 ml/m LA Vol (A4C):   50.0 ml 30.39 ml/m LA Biplane Vol: 55.4 ml 33.67 ml/m  AORTIC VALVE LVOT Vmax:         51.80 cm/s LVOT Vmean:        34.000 cm/s LVOT VTI:          0.107 m AI PHT:            603 msec AR Vena Contracta: 0.20 cm  AORTA Ao Root diam: 2.80 cm Ao Asc diam:  3.50 cm MR Peak grad:    93.7 mmHg    TRICUSPID VALVE MR Mean grad:    63.0 mmHg    TR Peak grad:   36.7 mmHg MR Vmax:         484.00 cm/s  TR Vmax:        303.00 cm/s MR Vmean:        384.0 cm/s MR PISA:         1.01 cm     SHUNTS MR PISA Eff ROA: 8 mm        Systemic VTI:  0.11 m MR PISA Radius:  0.40 cm      Systemic Diam: 2.00 cm Gwyndolyn Kaufman MD Electronically signed by Gwyndolyn Kaufman MD Signature Date/Time: 01/04/2022/10:54:03 AM    Final    DG Chest Port 1 View  Result Date: 01/04/2022 CLINICAL DATA:  [id number]; respiratory distress EXAM: PORTABLE CHEST 1 VIEW COMPARISON:  CT dated January 03, 2022; chest radiograph dated December 07, 2021 FINDINGS: The cardiomediastinal silhouette is unchanged and partially obscured in contour.ETT tip terminates 3.3 cm above the carina. Enteric tube tip and side port project over the stomach. LEFT IJ CVC tip terminates over the superior cavoatrial junction. Moderate to large RIGHT-sided pleural effusion, similar in comparison to most recent prior. Small LEFT pleural effusion. Heterogeneously consolidative RIGHT basilar airspace opacity with minimally improved aeration in comparison to most recent prior chest radiograph. This is overall markedly worsened since January 01, 2022. No pneumothorax. IMPRESSION: 1.  Support apparatus as described above. 2. Moderate to large RIGHT-sided pleural effusion of the RIGHT basilar airspace opacity. Minimally improved aeration in comparison to most recent prior chest radiograph but  overall markedly worsened since July 28th. Electronically Signed   By: Valentino Saxon M.D.   On: 01/04/2022 07:51   CT CHEST ABDOMEN PELVIS WO CONTRAST  Result Date: 01/03/2022 CLINICAL DATA:  Inpatient exam for sepsis. EXAM: CT CHEST, ABDOMEN AND PELVIS WITHOUT CONTRAST TECHNIQUE: Multidetector CT imaging of the chest,  abdomen and pelvis was performed following the standard protocol without IV contrast. RADIATION DOSE REDUCTION: This exam was performed according to the departmental dose-optimization program which includes automated exposure control, adjustment of the mA and/or kV according to patient size and/or use of iterative reconstruction technique. COMPARISON:  Portable chest today, portable chest studies 01/02/2022 and 12/11/2021, CTA chest yesterday at 10:57 p.m., and abdomen and pelvis CT no contrast 07/28/2008. FINDINGS: CT CHEST FINDINGS Cardiovascular: Moderate panchamber cardiomegaly with biatrial predominance is again noted. Again also noted is minimal air in the anterior aspect of the right atrium likely iatrogenic. There is a left IJ central line terminating at the superior cavoatrial junction. Minimal calcification noted proximal LAD coronary artery only, mild to moderate aortic atherosclerosis and tortuosity without aneurysm the pulmonary trunk upper limit of normal in caliber but unchanged. Central veins are mildly distended. Mediastinum/Nodes: There is a mildly prominent precarinal lymph node left of the midline again noted measuring 1.2 cm in short axis a similar sized subcarinal node. No further adenopathy is seen. Axillary spaces are clear. There are surgical clips in the right axilla. Enteric tube is in place with otherwise unremarkable esophagus. ETT abuts the right side of the distal trachea with the tip 2 cm from the carina. The main bronchi are clear. Thyroid is unremarkable. Lungs/Pleura: Right hemidiaphragm is elevated to the level of the mid hilum but this has been noted on  multiple prior chest x-rays. There is increasing moderate-to-large right pleural effusion, majority layering posteriorly but some of it could be loculated at least partially in the lateral base. The effusion completely collapses the right lower lobe on today's study and there is atelectasis or consolidation, with air bronchograms, in the overlying right middle lobe. There is a band of consolidation anteriorly in the right upper lobe which was seen yesterday and could be band atelectasis or pneumonia. No subpleural interstitial edema seen. There is subpleural atelectasis again in the posterior basal left lower lobe, linear scarring or atelectasis in the lingular base. There is faint left perihilar haziness which may suspect ground-glass edema or minimal pneumonitis. Left lung is otherwise clear.  There is no pneumothorax. Musculoskeletal: There is osteopenia, degenerative change of the thoracic spine and mild thoracic kyphodextroscoliosis. No acute or significant osseous findings are seen in the thorax. On the left there is a subpleural breast implant, prepectoral breast implant on the right which is probably an implant reconstruction postmastectomy. No acute chest wall abnormality is seen. CT ABDOMEN PELVIS FINDINGS Hepatobiliary: Interval development of capsular nodularity in the left lobe of the liver consistent with cirrhosis. The main portal vein is normal caliber. No focal liver lesion is seen allowing for artifact from the patient's arms. Gallbladder is distended and contains contrast and a few tiny stones. There is faint pericholecystic edema. Pancreas: No focal abnormality without contrast. Spleen: No focal abnormality without contrast. Adrenals/Urinary Tract: There is no adrenal mass. No renal mass is seen. There is persistent contrast enhancement of the bilateral renal cortex following yesterday's CTA chest concerning for contrast nephrotoxicity. There is no contrast in the urinary collecting systems and  bladder. There is asymmetric stranding around the right kidney. There is a 3 mm nonobstructive caliceal stone anteriorly in the upper pole of the right kidney. No other urinary stones or obstructing stones are seen. There is no hydroureteronephrosis The bladder is contracted around a Foley balloon and not well seen but it does not appear thickened. Vascular/Lymphatic: Moderate aortic patchy calcification without aneurysm. No adenopathy is seen.  Stomach/Bowel: NGT loops around within the stomach to the left with tip directed cephalad in the proximal stomach. The gastric wall, unopacified small bowel and retrocecal appendix are unremarkable. There are no findings of acute colitis or diverticulitis, with left-sided diverticula most numerous in the sigmoid. Reproductive: Uterus and bilateral adnexa are unremarkable. Other: There is moderate retroperitoneal stranding opacity beginning at the level of the renal veins, surrounding the bilateral renal veins, IVC and abdominal aorta continuing as far inferiorly as the aortic bifurcation, with scattered nonlocalizing fluid. For the most part this is below the level of the pancreas with only the most distal pancreatic tail abutting the process. Etiology indeterminate. No localizing fluid collection is seen. No free air is seen and no abscess or free hemorrhage is suspected. There is no incarcerated hernia. Trace ascites posterior deep pelvis and perihepatic space. There is also mild body wall anasarca. Musculoskeletal: There are degenerative changes of the lumbar spine. 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 IMPRESSION: 1. Interval intubation, NGT placement, left IJ central line. 2. Worsening right pleural effusion now completely collapsing the right lower lobe, with consolidation or atelectasis in the adjacent right middle lobe, and band consolidation or  atelectasis again in the anterior right upper lobe. Follow-up study recommended to ensure clearing. 3. Panchamber cardiomegaly with biatrial predominance, slightly prominent central pulmonary veins, no subpleural edema seen but there is left perihilar haziness which could be ground-glass edema or pneumonitis. 4. Previous right mastectomy with implant reconstruction. No chest wall mass. 5. Cirrhotic liver changes developed since 2010 but no splenomegaly. Trace ascites. No portal vein dilatation. 6. Distended gallbladder with small stones, contrast and faint pericholecystic edema, findings could be due to congestive edema or cholecystitis. No biliary dilatation. 7. Persistent contrast enhancement of the renal cortex since yesterday's CTA chest. This is concerning for contrast nephrotoxicity. 8. Asymmetric right perinephric stranding, which could be congestive or inflammatory. 9. Moderate retroperitoneal stranding and scattered nonlocalizing fluid extending from the renal veins to the aortic bifurcation. Only a small portion of this is in contact with the distal pancreas. Differential diagnosis includes congestive edema such as due to IVC and/or renal vein thrombosis, inflammatory/infectious process such as retroperitoneal fasciitis, and less likely pancreatitis. Close clinical follow-up is recommended and surgical consultation suggested. There is no soft tissue gas. Study is performed without contrast and patency of the IVC and renal veins is not known but IVC patency was seen last night. 10. Trace perihepatic and pelvic ascites. 11. Aortic and coronary artery atherosclerosis. 12. These results will be called to the ordering clinician or representative by the radiology assistant, and communication documented in the PACS or [place] dashboard. Electronically Signed   By: Telford Nab M.D.   On: 01/03/2022 21:37   CT HEAD WO CONTRAST (5MM)  Result Date: 01/03/2022 CLINICAL DATA:  Altered mental status. EXAM: CT HEAD  WITHOUT CONTRAST TECHNIQUE: Contiguous axial images were obtained from the base of the skull through the vertex without intravenous contrast. RADIATION DOSE REDUCTION: This exam was performed according to the departmental dose-optimization program which includes automated exposure control, adjustment of the mA and/or kV according to patient size and/or use of iterative reconstruction technique. COMPARISON:  Head CT dated 12/28/2020. FINDINGS: Brain: Mild age-related atrophy and chronic microvascular ischemic changes. There is no acute intracranial hemorrhage. No mass effect or midline shift. No extra-axial fluid collection. Vascular: No hyperdense vessel or unexpected calcification. Skull:  Normal. Negative for fracture or focal lesion. Sinuses/Orbits: Complete opacification of the right maxillary sinus and multiple ethmoid air cells. No air-fluid level. The mastoid air cells are clear. Other: None IMPRESSION: 1. No acute intracranial pathology. 2. Mild age-related atrophy and chronic microvascular ischemic changes. Electronically Signed   By: Anner Crete M.D.   On: 01/03/2022 20:56   DG Chest 1 View  Result Date: 01/03/2022 CLINICAL DATA:  Check endotracheal tube placement, respiratory distress EXAM: PORTABLE CHEST 1 VIEW COMPARISON:  Films from the previous day. FINDINGS: Cardiac shadow is stable. Increasing right-sided effusion and right basilar consolidation are seen. Left jugular central line is noted at the cavoatrial junction. No pneumothorax is seen. Endotracheal tube and gastric catheter are seen in satisfactory position. Mild left basilar atelectasis is seen. IMPRESSION: Increasing right-sided pleural effusion and basilar consolidation. Mild left basilar atelectasis. Tubes and lines in satisfactory position. Electronically Signed   By: Inez Catalina M.D.   On: 01/03/2022 19:05    Cardiac Studies   Echo:  See above.   Patient Profile     81 y.o. female with a hx of atrial fib on propranolol  80 mg qd and Xarelto, HTN, asthma, breast CA, anxiety, who is being seen 01/02/2022 for the evaluation of CHF & rapid atrial fib at the request of Dr Marthenia Rolling.  Assessment & Plan    Atrial fib with RVR:   Rate is well controlled.  Currently on IV heparin.    Hyponatremia:  Na up slightly.  Follow.   AI/MR:  Noted on prelim echo review.  Mild/Mod AI likely with at least moderate MR.  (MR worse than previous likely functional.)   Follow clinically.    Acute systolic HF:   EF markedly reduced with possible Takotsubo. Pressors weaning and oxygenating better post thoracentesis.  Possible extubation today.  Unable to titrate afterload reduction.  Check EKG.  Trop pending.  Not thought to be acute ischemia.   Respiratory failure:  Thoracentesis yesterday.     For questions or updates, please contact [place] Please consult [email or web address] for contact info under Cardiology/STEMI.   Signed, Minus Breeding, MD  01/05/2022, 8:13 AM

## 2022-01-05 NOTE — Progress Notes (Signed)
ANTICOAGULATION CONSULT NOTE   Pharmacy Consult for heparin Indication: atrial fibrillation  Allergies  Allergen Reactions   Chlorhexidine Gluconate Hives, Swelling and Rash       "ChloraPrep "   Doxycycline Nausea And Vomiting   Codeine Nausea Only and Other (See Comments)    Pt can tolerate when pre-medicated   Doxycycline Nausea And Vomiting   Nsaids     avoid due to gerd   Nsaids Other (See Comments)    Gi bleed   Tylenol [Acetaminophen]     Avoid due to GERD   Adhesive [Tape] Rash    Dermabond   Chlorhexidine Swelling and Rash   Codeine Nausea Only   Tylenol [Acetaminophen] Other (See Comments)    GI bleed    Patient Measurements: Height: 5' (152.4 cm) Weight: 66.8 kg (147 lb 4.3 oz) IBW/kg (Calculated) : 45.5 Heparin Dosing Weight: 60 kg   Vital Signs: Temp: 98.8 F (37.1 C) (08/01 2100) Temp Source: Bladder (08/01 1930) BP: 104/67 (08/01 2100) Pulse Rate: 75 (08/01 2100)  Labs: Recent Labs    01/03/22 2119 01/03/22 2120 01/04/22 0152 01/04/22 0513 01/04/22 1014 01/04/22 1558 01/04/22 1808 01/05/22 0000 01/05/22 0105 01/05/22 0502 01/05/22 1030 01/05/22 1236 01/05/22 2048  HGB 11.8*  --  12.8  --   --   --   --   --  11.1*  --   --   --   --   HCT 31.8*  --  34.7*  --   --   --   --   --  31.1*  --   --   --   --   PLT 145*  --  146*  --   --   --   --   --  140*  --   --   --   --   APTT 39*  --   --   --   --   --   --  119*  --   --   --  63* 68*  LABPROT 37.3*  --   --   --   --   --   --   --   --   --   --   --   --   INR 3.8*  --   --   --   --   --   --   --   --   --   --   --   --   HEPARINUNFRC  --  >1.10*  --   --   --   --   --  >1.10*  --   --  >1.10*  --   --   CREATININE 0.68  --  0.73 0.62   < > 0.66 0.67  --  0.66  --   --   --   --   TROPONINIHS 123*  --   --  131*  --   --   --   --   --  55*  --   --   --    < > = values in this interval not displayed.     Estimated Creatinine Clearance: 49.4 mL/min (by C-G formula based  on SCr of 0.66 mg/dL).   Medical History: Past Medical History:  Diagnosis Date   Anemia    history of   Anxiety    Arthritis    Asthma    Breast cancer ([place])    Cancer ([place])  skin cancers, one melanoma    Complication of anesthesia    patient woke up during colonoscopy   Concussion    8 yrs. 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    Keratosis, actinic    Melanoma (HCC)    Persistent atrial fibrillation (HCC) 12/16/2016   CHA2DS2VASC score 3   Pneumonia    hx of     Medications:  Scheduled:   arformoterol  15 mcg Nebulization BID   budesonide (PULMICORT) nebulizer solution  0.25 mg Nebulization BID   insulin aspart  0-9 Units Subcutaneous Q4H   [START ON 01/06/2022] methylPREDNISolone (SOLU-MEDROL) injection  40 mg Intravenous Q24H   midazolam  2 mg Intravenous Once   mouth rinse  15 mL Mouth Rinse Q2H   pantoprazole (PROTONIX) IV  40 mg Intravenous QHS   polyethylene glycol  17 g Per Tube Daily   revefenacin  175 mcg Nebulization Daily    Assessment: 5 yof presenting with acute hypoxemia related to COPD excerbation/PNA complicated by PEA arrest  - on Xarelto PTA for Afib (LD 7/29).   Repeat aPTT this evening is therapeutic at 68 seconds.  Goal of Therapy:  Heparin level 0.3-0.7 units/ml aPTT 66-102 seconds Monitor platelets by anticoagulation protocol: Yes   Plan:  Continue heparin 750 units/hr Daily aPTT, heparin level, CBC  Arrie Senate, PharmD, [place], [facility] Clinical Pharmacist [phone] Please check AMION for all [place] numbers 01/05/2022

## 2022-01-05 NOTE — Progress Notes (Signed)
[facility] Progress Note Patient Name: [name] DOB: January 17, 1941 MRN: [id number]   Date of Service  01/05/2022  HPI/Events of Note  Patient remains very restless and dyssynchronous despite PRN Fentanyl boluses, she has partially dislodged her ET tube in the process. Concern is that adding PRN versed might exacerbate delirium.  eICU Interventions  Will put patient back on a Fentanyl infusion. Will add Seroquel via enteral tube. Portable CXR to check ET tube position.        Kerry Kass [name] 01/05/2022, 10:14 PM

## 2022-01-05 NOTE — Plan of Care (Signed)

## 2022-01-05 NOTE — Progress Notes (Signed)
[facility] Progress Note Patient Name: [name] DOB: [birth year] MRN: [id number]   Date of Service  01/05/2022  HPI/Events of Note  CXR reviewed, Endotracheal tube and OG tube unchanged, Chest tube pointing more caudally but no evidence of pneumothorax.  eICU Interventions  No intervention.        Kerry Kass Joziyah Roblero 01/05/2022, 11:25 PM

## 2022-01-05 NOTE — TOC Initial Note (Signed)
Transition of Care [facility]) - Initial/Assessment Note    Patient Details  Name: [name] MRN: [id number] Date of Birth: July 01, 1940  [facility]) CM/SW Contact:    Bethena Roys, RN Phone Number: 01/05/2022, 3:54 PM  Clinical Narrative: Patient presented for shortness of breath and productive cough.Patient was discussed in rounds-Post PEA arrest- Patient remains intubated.  PTA patient was from home with support of family. Case Manager will continue to follow for transition of care needs.               Expected Discharge Plan: [place] Barriers to Discharge: Continued Medical Work up  Expected Discharge Plan and Services Expected Discharge Plan: [place]   Discharge Planning Services: CM Consult Post Acute Care Choice: [place] arrangements for the past 2 months: [place]                 Prior Living Arrangements/Services Living arrangements for the past 2 months: Single Family Home Lives with:: Spouse          Need for Family Participation in Patient Care: Yes (Comment) Care giver support system in place?: Yes (comment)   Criminal Activity/Legal Involvement Pertinent to Current Situation/Hospitalization: No - Comment as needed  Activities of Daily Living Home Assistive Devices/Equipment: None ADL Screening (condition at time of admission) Patient's cognitive ability adequate to safely complete daily activities?: Yes Is the patient deaf or have difficulty hearing?: No Does the patient have difficulty seeing, even when wearing glasses/contacts?: No Does the patient have difficulty concentrating, remembering, or making decisions?: No Patient able to express need for assistance with ADLs?: Yes Does the patient have difficulty dressing or bathing?: No Independently performs ADLs?: Yes (appropriate for developmental age) Does the patient have difficulty walking or climbing stairs?: Yes Weakness  of Legs: Both Weakness of Arms/Hands: Both  Permission Sought/Granted Permission sought to share information with : Family Supports, Case Manager   Alcohol / Substance Use: Not Applicable Psych Involvement: No (comment)  Admission diagnosis:  CAP (community acquired pneumonia) [J18.9] Patient Active Problem List   Diagnosis Date Noted   Cardiac arrest ([place])    Acute respiratory failure with hypoxia ([place])    Pneumonia of right lower lobe due to infectious organism    CAP (community acquired pneumonia) 12/11/2021   A-fib ([place]) 12/29/2020   Asthma, chronic, unspecified asthma severity, with acute exacerbation 12/29/2020   GAD (generalized anxiety disorder) 12/29/2020   History of breast cancer 12/29/2020   GERD (gastroesophageal reflux disease) 12/29/2020   Acute lower UTI 12/29/2020   Lactic acid increased 12/29/2020   Syncope 12/28/2020   Hypotension 12/28/2020   Hyponatremia 12/28/2020   History of breast cancer 06/26/2018   Genetic testing 12/24/2016   Persistent atrial fibrillation ([place]) 12/16/2016   Long term current use of anticoagulant therapy 12/16/2016   Hypertensive heart disease without CHF    Family history of breast cancer    Melanoma ([place])    Malignant neoplasm of upper-inner quadrant of right breast in female, estrogen receptor positive ([place]) 11/19/2016   OA (osteoarthritis) of knee 01/29/2013   PCP:  [place], [place] @ [place]:   [id number] [place] [place]: [phone]: [phone] [id number] [place], [place] - [address]: [phone]: [phone]  [place] at [place] [address] Phone: [phone] Fax: [phone]   Readmission Risk Interventions     No data to display

## 2022-01-05 NOTE — Progress Notes (Signed)
NAME:  MIGUELINA FORE, MRN:  [id number], DOB:  December 30, 1940, LOS: 4 ADMISSION DATE:  12/21/2021, CONSULTATION DATE:  01/03/22 REFERRING MD:  Graciella Freer, CHIEF COMPLAINT:  cardiac arrest   History of Present Illness:  81 year old woman w/ hx of Afib on xarelto who presented with R sided pleurisy, cough, fevers.  Found to have R sided pneumonia, new hypoxemia, afib/RVR, and admitted to progressive.  Also tx empirically for possible COPD exacerbation.  Today worsening SOB then found pulseless.  7 min CPR, PEA.  Some purposeful movement after arrest.  PCCM consulted.  Pertinent  Medical History  Breast ca on letrozole Afib on [place] Hospital Events: Including procedures, antibiotic start and stop dates in addition to other pertinent events   7/28 admit 7/29 last dose xeralto  7/30 IHCA x 7 min 7/31 Chest tube placed, heparin gtt started  Interim History / Subjective:  Chest tube placed on 7/31  No acute events overnight  600 mL of chest tube, 1.2 L urine output, -1.7 L admission  2 levophed  100 fentanyl  Unable to obtain subjective evaluation due to patient status  Objective   Blood pressure (!) 129/106, pulse (!) 153, temperature 98.4 F (36.9 C), resp. rate (!) 28, height 5' (1.524 m), weight 66.8 kg, SpO2 97 %.    Vent Mode: PRVC FiO2 (%):  [40 %] 40 % Set Rate:  [25 bmp-28 bmp] 28 bmp Vt Set:  [360 mL] 360 mL PEEP:  [5 cmH20] 5 cmH20 Plateau Pressure:  [17 cmH20-19 cmH20] 19 cmH20   Intake/Output Summary (Last 24 hours) at 01/05/2022 1002 Last data filed at 01/05/2022 2831 Gross per 24 hour  Intake 2420.11 ml  Output 1837 ml  Net 583.11 ml   Filed Weights   01/03/22 0630 01/04/22 0500 01/05/22 0446  Weight: 65.2 kg 67.4 kg 66.8 kg    Examination: General: In bed, NAD, appears comfortable, restless HEENT: MM pink/moist, anicteric, atraumatic Neuro: RASS +1, PERRL 74m, GCS 11t CV: S1S2, Afib, no m/r/g appreciated PULM:  air movement in all lobes, trachea  midline, chest expansion symmetric.  Chest tube to -20, no air leak.  GI: soft, bsx4 active, non-tender   Extremities: warm/dry, no pretibial edema, capillary refill less than 3 seconds  Skin:  no rashes or lesions noted  Labs: Na 128>129 >130 Potassium 3.4 > 3.3 Chloride 93 > 96 Albumin 2.1 Prior Bilirubin 2.1 > 1.3 WBC 20.8 > 18.8 > 13.7 Hemoglobin 12.8 > 11.8 Magnesium 1.6 Phos 1.9 Troponin 131 > 55 LDH pleural fluid 278, Protein pleural fluid less than 3 Serum Total protein 5.6, LDH 161 Respiratory culture pending Body fluid culture pending ECHO: LVEF 20 to 25%, all mid to apical LV segments are akinetic.  Basal LV segments demonstrate normal contractility.  Findings most consistent with Takotsubo cardiomyopathy.  Moderate mitral valve regurgitation.  Mild to moderate tricuspid valve regurgitation.  Chest x-ray: Stable pigtail chest tube, no pneumothorax, effusion improved. Twelve-lead A-fib, T wave abnormalities present on previous ECG  Resolved Hospital Problem list   N/A  Assessment & Plan:  In hospital cardiac arrest, PEA arrest, in the setting of hypoxemia and hyperkalemia. Postarrest shock-improving ?Takotsubo cardiomyopathy Troponin elevation-improving ECHO: LVEF 20 to 25%, all mid to apical LV segments are akinetic.  Basal LV segments demonstrate normal contractility.  Findings most consistent with Takotsubo cardiomyopathy.  Moderate mitral valve regurgitation.  Mild to moderate tricuspid valve regurgitation. Troponin 131 > 55 -Goal MAP 65.  Continue Levophed.  Titrate  to goal -Appreciate cardiology assistance -Continue heparin drip.  Last dose of Xarelto 7/29 -As needed EKG and troponin -Continue telemetry -Holding on GDMT while on vasopressors  Right lower lobe pneumonia Acute respiratory failure with hypoxia, secondary to pneumonia Right pleural effusion, status post chest tube placement on 7/31-? Exudative LDH pleural fluid 278, Protein pleural fluid less  than 3, Serum Total protein 5.6, LDH 161. Chest x-ray: Stable pigtail chest tube, no pneumothorax, effusion improved.  History of breast cancer. WBC 20.8 > 18.8 > 13.7. -LTVV strategy with tidal volumes of 4-8 cc/kg ideal body weight -Goal plateau pressures less than 30 and driving pressures less than 15 -Wean PEEP/FiO2 for SpO2 92-98% -VAP bundle -Daily SAT and SBT.  Hope to Extubate today -PAD bundle with Precedex gtt and fentanyl gtt -RASS goal 0 to -1 -Follow intermittent CXR and ABG PRN -Continue vancomycin/cefepime/azithromycin -Continue chest tube to -20 -Follow cultures, follow-up strep/Legionella -Continue Pulmicort Garlon Hatchet and Yupelri -Weaning steroids- on Solu-Medrol 40 mg  Hyponatremia Na 128>129 >130 -Monitor on labs -No free water  Post Arrest Encephalopathy Patient follows commands on exam -Minimize sedation -PAD as above -Frequent reorientation  Hypokalemia Hypomagnesemia Hypophosphatemia -Replete -Follow-up on a.m. labs  Nutrition -Start tubefeeds  Hyperglycemia Blood sugars 104-221, secondary to steroids -Continue sliding scale sensitive -Appreciate diabetes assistance   Best Practice (right click and "Reselect all SmartList Selections" daily)   Diet/type: tubefeeds DVT prophylaxis: systemic heparin GI prophylaxis: PPI Lines: Central line, Arterial Line, and yes and it is still needed Foley:  Yes, and it is still needed Code Status:  full code Last date of multidisciplinary goals of care discussion [husband updated at bedside on 8/1]   Critical care time: 40 minutes    Redmond School., MSN, APRN, AGACNP-BC [facility]  01/05/2022 , 11:38 AM  Please see [email or web address] for pager details  If no response, please call [phone] After hours, please call [place] at [phone]

## 2022-01-05 DEATH — deceased

## 2022-01-06 ENCOUNTER — Inpatient Hospital Stay (HOSPITAL_COMMUNITY): Payer: Medicare Other

## 2022-01-06 DIAGNOSIS — R4182 Altered mental status, unspecified: Secondary | ICD-10-CM | POA: Diagnosis not present

## 2022-01-06 DIAGNOSIS — J90 Pleural effusion, not elsewhere classified: Secondary | ICD-10-CM | POA: Diagnosis not present

## 2022-01-06 DIAGNOSIS — J9601 Acute respiratory failure with hypoxia: Secondary | ICD-10-CM

## 2022-01-06 DIAGNOSIS — J189 Pneumonia, unspecified organism: Secondary | ICD-10-CM | POA: Diagnosis not present

## 2022-01-06 DIAGNOSIS — R0602 Shortness of breath: Secondary | ICD-10-CM | POA: Diagnosis not present

## 2022-01-06 DIAGNOSIS — I469 Cardiac arrest, cause unspecified: Secondary | ICD-10-CM | POA: Diagnosis not present

## 2022-01-06 LAB — GLUCOSE, CAPILLARY
Glucose-Capillary: 100 mg/dL — ABNORMAL HIGH (ref 70–99)
Glucose-Capillary: 101 mg/dL — ABNORMAL HIGH (ref 70–99)
Glucose-Capillary: 103 mg/dL — ABNORMAL HIGH (ref 70–99)
Glucose-Capillary: 112 mg/dL — ABNORMAL HIGH (ref 70–99)
Glucose-Capillary: 143 mg/dL — ABNORMAL HIGH (ref 70–99)
Glucose-Capillary: 92 mg/dL (ref 70–99)
Glucose-Capillary: 98 mg/dL (ref 70–99)

## 2022-01-06 LAB — CBC
HCT: 32.4 % — ABNORMAL LOW (ref 36.0–46.0)
Hemoglobin: 11.1 g/dL — ABNORMAL LOW (ref 12.0–15.0)
MCH: 36.5 pg — ABNORMAL HIGH (ref 26.0–34.0)
MCHC: 34.3 g/dL (ref 30.0–36.0)
MCV: 106.6 fL — ABNORMAL HIGH (ref 80.0–100.0)
Platelets: 156 10*3/uL (ref 150–400)
RBC: 3.04 MIL/uL — ABNORMAL LOW (ref 3.87–5.11)
RDW: 13.3 % (ref 11.5–15.5)
WBC: 17.6 10*3/uL — ABNORMAL HIGH (ref 4.0–10.5)
nRBC: 0 % (ref 0.0–0.2)

## 2022-01-06 LAB — COMPREHENSIVE METABOLIC PANEL
ALT: 22 U/L (ref 0–44)
AST: 31 U/L (ref 15–41)
Albumin: 2.3 g/dL — ABNORMAL LOW (ref 3.5–5.0)
Alkaline Phosphatase: 77 U/L (ref 38–126)
Anion gap: 7 (ref 5–15)
BUN: 22 mg/dL (ref 8–23)
CO2: 25 mmol/L (ref 22–32)
Calcium: 8.9 mg/dL (ref 8.9–10.3)
Chloride: 100 mmol/L (ref 98–111)
Creatinine, Ser: 0.71 mg/dL (ref 0.44–1.00)
GFR, Estimated: >60 mL/min (ref >60)
Glucose, Bld: 113 mg/dL — ABNORMAL HIGH (ref 70–99)
Potassium: 3.5 mmol/L (ref 3.5–5.1)
Sodium: 132 mmol/L — ABNORMAL LOW (ref 135–145)
Total Bilirubin: 1.1 mg/dL (ref 0.3–1.2)
Total Protein: 5.8 g/dL — ABNORMAL LOW (ref 6.5–8.1)

## 2022-01-06 LAB — POCT I-STAT 7, (LYTES, BLD GAS, ICA,H+H)
Acid-Base Excess: 0 mmol/L (ref 0.0–2.0)
Bicarbonate: 26.9 mmol/L (ref 20.0–28.0)
Calcium, Ion: 1.32 mmol/L (ref 1.15–1.40)
HCT: 33 % — ABNORMAL LOW (ref 36.0–46.0)
Hemoglobin: 11.2 g/dL — ABNORMAL LOW (ref 12.0–15.0)
O2 Saturation: 99 %
Patient temperature: 37.11
Potassium: 3.5 mmol/L (ref 3.5–5.1)
Sodium: 132 mmol/L — ABNORMAL LOW (ref 135–145)
TCO2: 29 mmol/L (ref 22–32)
pCO2 arterial: 53.7 mmHg — ABNORMAL HIGH (ref 32–48)
pH, Arterial: 7.309 — ABNORMAL LOW (ref 7.35–7.45)
pO2, Arterial: 141 mmHg — ABNORMAL HIGH (ref 83–108)

## 2022-01-06 LAB — CULTURE, BLOOD (ROUTINE X 2)
Culture: NO GROWTH
Culture: NO GROWTH
Special Requests: ADEQUATE

## 2022-01-06 LAB — PHOSPHORUS
Phosphorus: 4.3 mg/dL (ref 2.5–4.6)
Phosphorus: 2 mg/dL — ABNORMAL LOW (ref 2.5–4.6)

## 2022-01-06 LAB — HEPARIN LEVEL (UNFRACTIONATED)
Heparin Unfractionated: 1.05 IU/mL — ABNORMAL HIGH (ref 0.30–0.70)
Heparin Unfractionated: 0.89 IU/mL — ABNORMAL HIGH (ref 0.30–0.70)

## 2022-01-06 LAB — APTT
aPTT: 34 seconds (ref 24–36)
aPTT: 58 seconds — ABNORMAL HIGH (ref 24–36)
aPTT: 65 seconds — ABNORMAL HIGH (ref 24–36)

## 2022-01-06 LAB — MAGNESIUM: Magnesium: 2.4 mg/dL (ref 1.7–2.4)

## 2022-01-06 MED ORDER — METOPROLOL TARTRATE 25 MG PO TABS: 25.0000 mg | ORAL_TABLET | Freq: Two times a day (BID) | ORAL | Status: DC

## 2022-01-06 MED ORDER — ORAL CARE MOUTH RINSE: 15.0000 mL | OROMUCOSAL | Status: DC | PRN

## 2022-01-06 MED ORDER — ETOMIDATE 2 MG/ML IV SOLN
INTRAVENOUS | Status: AC
  Filled 2022-01-06: qty 20

## 2022-01-06 MED ORDER — METOPROLOL TARTRATE 5 MG/5ML IV SOLN: 2.5000 mg | Freq: Four times a day (QID) | INTRAVENOUS | Status: DC | PRN

## 2022-01-06 MED ORDER — MIDAZOLAM HCL 2 MG/2ML IJ SOLN
1.0000 mg | INTRAMUSCULAR | Status: DC | PRN
  Administered 2022-01-06: 2 mg via INTRAVENOUS
  Filled 2022-01-06: qty 2

## 2022-01-06 MED ORDER — MIDAZOLAM HCL 2 MG/2ML IJ SOLN
INTRAMUSCULAR | Status: AC
  Filled 2022-01-06: qty 2

## 2022-01-06 MED ORDER — METOPROLOL TARTRATE 5 MG/5ML IV SOLN
2.5000 mg | Freq: Four times a day (QID) | INTRAVENOUS | Status: AC | PRN
  Administered 2022-01-06: 2.5 mg via INTRAVENOUS
  Filled 2022-01-06: qty 5

## 2022-01-06 MED ORDER — SUCCINYLCHOLINE CHLORIDE 200 MG/10ML IV SOSY
PREFILLED_SYRINGE | INTRAVENOUS | Status: AC
  Filled 2022-01-06: qty 10

## 2022-01-06 MED ORDER — ACETAMINOPHEN 650 MG RE SUPP
650.0000 mg | Freq: Four times a day (QID) | RECTAL | Status: DC | PRN
  Administered 2022-01-06 – 2022-02-01 (×3): 650 mg via RECTAL
  Filled 2022-01-06 (×4): qty 1

## 2022-01-06 MED ORDER — FENTANYL CITRATE PF 50 MCG/ML IJ SOSY
PREFILLED_SYRINGE | INTRAMUSCULAR | Status: AC
  Filled 2022-01-06: qty 2

## 2022-01-06 MED ORDER — ALBUTEROL SULFATE (2.5 MG/3ML) 0.083% IN NEBU
2.5000 mg | INHALATION_SOLUTION | RESPIRATORY_TRACT | Status: DC | PRN
  Administered 2022-01-09: 2.5 mg via RESPIRATORY_TRACT
  Filled 2022-01-06: qty 3

## 2022-01-06 MED ORDER — ALBUTEROL SULFATE (2.5 MG/3ML) 0.083% IN NEBU
INHALATION_SOLUTION | RESPIRATORY_TRACT | Status: AC
  Administered 2022-01-06: 2.5 mg
  Filled 2022-01-06: qty 3

## 2022-01-06 MED ORDER — POTASSIUM CHLORIDE 20 MEQ PO PACK
40.0000 meq | PACK | Freq: Once | ORAL | Status: AC
  Administered 2022-01-06: 40 meq
  Filled 2022-01-06: qty 2

## 2022-01-06 MED ORDER — MORPHINE SULFATE (PF) 2 MG/ML IV SOLN
1.0000 mg | Freq: Once | INTRAVENOUS | Status: AC
  Administered 2022-01-06: 1 mg via INTRAVENOUS
  Filled 2022-01-06: qty 1

## 2022-01-06 MED ORDER — ORAL CARE MOUTH RINSE
15.0000 mL | OROMUCOSAL | Status: DC
  Administered 2022-01-06 – 2022-01-10 (×12): 15 mL via OROMUCOSAL

## 2022-01-06 MED ORDER — ROCURONIUM BROMIDE 10 MG/ML (PF) SYRINGE
PREFILLED_SYRINGE | INTRAVENOUS | Status: AC
  Filled 2022-01-06: qty 10

## 2022-01-06 MED ORDER — FUROSEMIDE 10 MG/ML IJ SOLN
40.0000 mg | Freq: Once | INTRAMUSCULAR | Status: AC
  Administered 2022-01-06: 40 mg via INTRAVENOUS
  Filled 2022-01-06: qty 4

## 2022-01-06 MED ORDER — KETAMINE HCL 50 MG/5ML IJ SOSY
PREFILLED_SYRINGE | INTRAMUSCULAR | Status: AC
  Filled 2022-01-06: qty 5

## 2022-01-06 NOTE — Progress Notes (Addendum)
[facility] Progress Note Patient Name: [name] DOB: Jan 18, 1941 MRN: [id number]   Date of Service  01/06/2022  HPI/Events of Note  Patient with an episode of extreme agitation despite Fentanyl gtt at 200 mcg, and PRN Fentanyl boluses, she managed to pull out her pigtail chest tube. Patient appeared to have an upward gaze but is otherwise completely non-focal neurologic exam.  eICU Interventions  Fentanyl gtt ceiling increased to 400 mcg, and PRN Versed iv added. EEG to r/o seizures,  CT head, Neurology consultation, will defer replacement  of the pigtail catheter to the daytime PCCM attending physician.        Kerry Kass Kaylianna Detert 01/06/2022, 2:40 AM

## 2022-01-06 NOTE — Progress Notes (Signed)
    RN paged reporting with A fib with HR 110-120s at rest and 130-140s with activity/agitation. She is here for PEA arrest, LVEF 20-25%, has RLL pneumonia with parapneumonic effusion.  Suspecting fever is mediating tachyarrhythmia, ok for HR 110-120s in the setting of sepsis, patient is no longer requiring vasopressor since 3 PM, blood pressure normal with MAP above 65.  Will start p.o. metoprolol '25mg'$  BID control, advised RN to insert NG tube for medication administration if needed as patient is pending swallow evaluation.

## 2022-01-06 NOTE — Progress Notes (Signed)
[facility] Progress Note Patient Name: [name] DOB: [birth year] MRN: [id number]   Date of Service  01/06/2022  HPI/Events of Note  CT did not show a bleed, EEG is being hooked up. She is currently off sedation pending neurology assessment. Patient is dyssynchronous.  eICU Interventions  Resume Heparin gtt, Neurology to come and assess patient, following which she will be properly sedated.        Kerry Kass Dianca Owensby 01/06/2022, 4:44 AM

## 2022-01-06 NOTE — Progress Notes (Signed)
Pharmacy Antibiotic Note  [name] is a 81 y.o. female admitted on 12/30/2021 with pneumonia.  PMH of afib on Xarelto, HTN, CHF, and right breast cancer on letrozole.   Patient admitted for R sided pneumonia with current treatment of azithromycin and ceftriaxone. Patient has reported worsening SOB. Today was found pulseless and rapid response was called for code blue. ROSC achieved after 7 minutes of CPR.   Scr 0.71 (CrCl 50 mL/min). On day #6 of antibiotics - day #3 of cefepime. WBC up slightly to 17, afebrile. Weaning steroids.   Plan:  Continue cefepime 2 g IV q12h - stop date of 8/4 Monitor renal fx, cx results  Height: 5' (152.4 cm) Weight: 69.7 kg (153 lb 10.6 oz) IBW/kg (Calculated) : 45.5  Temp (24hrs), Avg:98.6 F (37 C), Min:96.4 F (35.8 C), Max:99.9 F (37.7 C)  Recent Labs  Lab 12/15/2021 2249 01/02/22 0029 01/03/22 1858 01/03/22 2119 01/04/22 0152 01/04/22 0513 01/04/22 1014 01/04/22 1558 01/04/22 1808 01/05/22 0105 01/06/22 0447  WBC 9.5  --   --  20.8* 18.8*  --   --   --   --  15.7* 17.6*  CREATININE  --    < >  --  0.68 0.73 0.62 0.66 0.66 0.67 0.66 0.71  LATICACIDVEN  --   --  4.5*  --   --  1.9  --   --   --   --   --    < > = values in this interval not displayed.     Estimated Creatinine Clearance: 50.5 mL/min (by C-G formula based on SCr of 0.71 mg/dL).    Allergies  Allergen Reactions   Chlorhexidine Gluconate Hives, Swelling and Rash       "ChloraPrep "   Doxycycline Nausea And Vomiting   Codeine Nausea Only and Other (See Comments)    Pt can tolerate when pre-medicated   Doxycycline Nausea And Vomiting   Nsaids     avoid due to gerd   Nsaids Other (See Comments)    Gi bleed   Tylenol [Acetaminophen]     Avoid due to GERD   Adhesive [Tape] Rash    Dermabond   Chlorhexidine Swelling and Rash   Codeine Nausea Only   Tylenol [Acetaminophen] Other (See Comments)    GI bleed    Antimicrobials this admission: Azithro 7/28 >>  8/2 Ceftriaxone 7/28>> 7/29 Vanc 7/30>> 8/1 Cefepime 7/31>>   Microbiology results: Pleural fluid 7/31: ngtd Bcx 7/28: ngtd  BAL 7/30: ngtd  Ucx 7/29: ng  MRSA PCR 7/28: neg    Thank you for allowing pharmacy to be a part of this patient's care.  Antonietta Jewel, PharmD, [place] Clinical Pharmacist  Phone: [phone] 01/06/2022 10:51 AM  Please check AMION for all [place] phone numbers After 10:00 PM, call [place] [phone]

## 2022-01-06 NOTE — Progress Notes (Signed)
Stat  EEG complete - results pending.  

## 2022-01-06 NOTE — Progress Notes (Signed)
ANTICOAGULATION CONSULT NOTE   Pharmacy Consult for Heparin Indication: atrial fibrillation  Allergies  Allergen Reactions   Chlorhexidine Gluconate Hives, Swelling and Rash       "ChloraPrep "   Doxycycline Nausea And Vomiting   Codeine Nausea Only and Other (See Comments)    Pt can tolerate when pre-medicated   Doxycycline Nausea And Vomiting   Nsaids     avoid due to gerd   Nsaids Other (See Comments)    Gi bleed   Tylenol [Acetaminophen]     Avoid due to GERD   Adhesive [Tape] Rash    Dermabond   Chlorhexidine Swelling and Rash   Codeine Nausea Only   Tylenol [Acetaminophen] Other (See Comments)    GI bleed    Patient Measurements: Height: 5' (152.4 cm) Weight: 69.7 kg (153 lb 10.6 oz) IBW/kg (Calculated) : 45.5 Heparin Dosing Weight: 60 kg   Vital Signs: Temp: 100.4 F (38 C) (08/02 2200) Temp Source: Bladder (08/02 2200) BP: 125/112 (08/02 2200) Pulse Rate: 109 (08/02 2200)  Labs: Recent Labs    01/04/22 0152 01/04/22 0513 01/04/22 1014 01/04/22 1808 01/05/22 0000 01/05/22 0105 01/05/22 0502 01/05/22 1030 01/05/22 1236 01/06/22 0432 01/06/22 0447 01/06/22 1132 01/06/22 2205  HGB 12.8  --   --   --   --  11.1*  --   --   --  11.2* 11.1*  --   --   HCT 34.7*  --   --   --   --  31.1*  --   --   --  33.0* 32.4*  --   --   PLT 146*  --   --   --   --  140*  --   --   --   --  156  --   --   APTT  --   --   --   --    < >  --   --   --    < >  --  34 58* 65*  HEPARINUNFRC  --   --   --   --    < >  --   --  >1.10*  --   --  1.05* 0.89*  --   CREATININE 0.73 0.62   < > 0.67  --  0.66  --   --   --   --  0.71  --   --   TROPONINIHS  --  131*  --   --   --   --  55*  --   --   --   --   --   --    < > = values in this interval not displayed.     Estimated Creatinine Clearance: 50.5 mL/min (by C-G formula based on SCr of 0.71 mg/dL).   Medical History: Past Medical History:  Diagnosis Date   Anemia    history of   Anxiety    Arthritis     Asthma    Breast cancer ([place])    Cancer ([place])    skin cancers, one melanoma    Complication of anesthesia    patient woke up during colonoscopy   Concussion    8 yrs. ago due to being thrown from a horse   Dislocated elbow 10/12/2016   left   Family history of breast cancer    GERD (gastroesophageal reflux disease)    History of kidney stones    History of radiation therapy 03/23/17-05/04/17  right chest wall 50.4 Gy in 28 fractions, right axilla 45 Gy in 25 fractions   Hypertension    Hypertensive heart disease without CHF    Keratosis, actinic    Melanoma (H[facility])    Persistent atrial fibrillation ([facility]) 12/16/2016   CHA2DS2VASC score 3   Pneumonia    hx of     Medications:  Scheduled:   arformoterol  15 mcg Nebulization BID   budesonide (PULMICORT) nebulizer solution  0.25 mg Nebulization BID   insulin aspart  0-9 Units Subcutaneous Q4H   methylPREDNISolone (SOLU-MEDROL) injection  40 mg Intravenous Q24H   metoprolol tartrate  25 mg Oral BID   midazolam  2 mg Intravenous Once   mouth rinse  15 mL Mouth Rinse 4 times per day   pantoprazole (PROTONIX) IV  40 mg Intravenous QHS   polyethylene glycol  17 g Per Tube Daily   revefenacin  175 mcg Nebulization Daily    Assessment: 51 yof presenting with acute hypoxemia related to COPD excerbation/PNA complicated by PEA arrest  - on Xarelto PTA for Afib (LD 7/29).   Heparin level still remains elevated at 0.89, aPTT subtherapeutic at 58, on 750 units/hr. No s/sx of bleeding or infusion issues.  Given recent DOAC will monitor both aPTT and heparin level until correlate.   8/2 PM update:  aPTT just below goal  Goal of Therapy:  Heparin level 0.3-0.7 units/ml aPTT 66-102 seconds Monitor platelets by anticoagulation protocol: Yes   Plan:  Increase heparin to 900 units/hr Heparin level and aPTT with AM labs Monitor CBC, and for s/sx of bleeding   Narda Bonds, PharmD, BCPS Clinical Pharmacist Phone:  [phone]

## 2022-01-06 NOTE — Plan of Care (Signed)
Neurology plan of care  Please see full neurology consult from this AM for full findings and recommendations. rEEG showed diffuse slowing without epileptiform abnl. No further inpatient neurologic workup indicated at this time. Recommend judicious use of opioids and sedation. Neurology to sign off, but please re-engage if additional neurologic concerns arise.  Su Monks, MD [facility] [phone]  If 7pm- 7am, please page neurology on call as listed in [place].

## 2022-01-06 NOTE — Procedures (Signed)
Extubation Procedure Note  Patient Details:   Name: [name] DOB: 1940/09/03 MRN: [id number]   Airway Documentation:    Vent end date: 01/06/22 Vent end time: 1209   Evaluation  O2 sats: stable throughout Complications: No apparent complications Patient did tolerate procedure well. Bilateral Breath Sounds: Rhonchi, Diminished   No, pt could not speak or follow commands post extubation.  Pt extubated to 4 l/m [place] per physician's order.  Earney Navy 01/06/2022, 12:10 PM

## 2022-01-06 NOTE — Progress Notes (Addendum)
NAME:  [name], MRN:  [id number], DOB:  September 02, 1940, LOS: 5 ADMISSION DATE:  12/12/2021, CONSULTATION DATE:  01/03/22 REFERRING MD:  Graciella Freer, CHIEF COMPLAINT:  cardiac arrest   History of Present Illness:  81 year old woman w/ hx of Afib on xarelto who presented with R sided pleurisy, cough, fevers.  Found to have R sided pneumonia, new hypoxemia, afib/RVR, and admitted to progressive.  Also tx empirically for possible COPD exacerbation.  Today worsening SOB then found pulseless.  7 min CPR, PEA.  Some purposeful movement after arrest.  PCCM consulted.  Pertinent  Medical History  Breast ca on letrozole Afib on [place] Hospital Events: Including procedures, antibiotic start and stop dates in addition to other pertinent events   7/28 admit, Ceftriaxone/Azithro 7/29 last dose xeralto  7/30 IHCA x 7 min, Ceftriaxone>Cefepime 7/31 Chest tube placed, heparin gtt started 8/1 patient remove chest tube.  Head CT obtained >negative.  Severely agitated overnight.  Vancomycin stopped.  Azithromycin stopped.  Interim History / Subjective:  Tmax 99.9 360 mL out of chest tube, 290 mL urine output, -41 mL admission.  Increasing agitation overnight.  As needed fentanyl ordered >fentanyl drip and Seroquel >will see in the ceiling increased to 400.  Neurology consult for gaze deviation. Head CT negative.  EEG started.  Patient remove chest tube.  300 fentanyl 2 Levophed  + 1.5L past 24 hours, -170 admit.  Unable to obtain subjective evaluation due to patient status   Objective   Blood pressure 103/87, pulse 65, temperature 98.2 F (36.8 C), resp. rate 20, height 5' (1.524 m), weight 69.7 kg, SpO2 96 %. CVP:  [17 mmHg] 17 mmHg  Vent Mode: PRVC FiO2 (%):  [40 %-50 %] 40 % Set Rate:  [14 bmp] 14 bmp Vt Set:  [360 mL] 360 mL PEEP:  [5 cmH20] 5 cmH20 Plateau Pressure:  [14 cmH20-22 cmH20] 22 cmH20   Intake/Output Summary (Last 24 hours) at 01/06/2022 1044 Last data filed at  01/06/2022 1000 Gross per 24 hour  Intake 1721.97 ml  Output 435 ml  Net 1286.97 ml   Filed Weights   01/04/22 0500 01/05/22 0446 01/06/22 0500  Weight: 67.4 kg 66.8 kg 69.7 kg    Examination: General: In bed, NAD, appears comfortable, Elderly  HEENT: MM pink/moist, anicteric, atraumatic Neuro: RASS -3, PERRL 27m, sedated CV: S1S2, Afib, no m/r/g appreciated PULM:  coarse in the upper lobes, coarse in the lower lobes, trachea midline, chest expansion symmetric GI: soft, bsx4 active, non-tender   Extremities: warm/dry, no pretibial edema, capillary refill less than 3 seconds  Skin:  no rashes or lesions noted  Labs/imaging: 7.30/53/141/26 BG 92-178 MRSA PCR negative Respiratory culture no growth to date 2 days Pleural fluid culture no growth 24 hours Blood cultures no growth 4 day. WBC 20.8 > 18.8 > 15.7 > 17.6 Hemoglobin 11.1 > 11.1 Platelets 140 > 156 Sodium 130 > 132 Potassium 3.5 CXR: Removal of chest tube on right side, no pneumothorax, worsening aeration in right lower lobe. Head CT: Negative for acute changes EEG: No seizures seen in EEG  Resolved Hospital Problem list   N/A  Assessment & Plan:  In hospital cardiac arrest, PEA arrest, in the setting of hypoxemia and hyperkalemia. 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  Basal LV segments demonstrate normal contractility.  Findings most consistent with Takotsubo cardiomyopathy.  Moderate mitral  valve regurgitation.  Mild to moderate tricuspid valve regurgitation. Troponin 131 > 55.  CVP 15 on 8/2 -Goal MAP 65.  Continue Levophed and.  Titrate to goal -Appreciate cardiology assistance. -Lasix 40 mg x 1 IV -Continue heparin drip.  Last dose of Xarelto 7/29 -As needed EKG -Goal potassium greater than 4, goal magnesium greater than 2.  Potassium repleted.  Right lower lobe  pneumonia Acute respiratory failure with hypoxia, secondary to pneumonia Right pleural effusion, status post chest tube placement on 7/31-? Exudative LDH pleural fluid 278, Protein pleural fluid less than 3, Serum Total protein 5.6, LDH 161.  History of breast cancer. WBC 20.8 > 18.8 > 13.7>17.6.  On steroids.  -LTVV strategy with tidal volumes of 4-8 cc/kg ideal body weight -Goal plateau pressures less than 30 and driving pressures less than 15 -Wean PEEP/FiO2 for SpO2 92-98% -VAP bundle -Daily SAT and SBT.  Hope to extubate again today -PAD bundle with Precedex gtt. PRN IV fentanyl push -RASS goal 0 to -1 -Follow intermittent CXR and ABG PRN -Continue Pulmicort Brovana and Yupelri, PRN albuterol -Weaning steroids- on Solu-Medrol 40 mg, last dose 8/3 -Continue cefepime day 4 of 5.  Follow-up on cultures.  Hyponatremia Na 128>129 >130>131 -Monitor labs -No free water  Post Arrest Encephalopathy Head CT negative.  EEG with no epileptiform discharges seen.  Neuro consulted did not feel strongly that patient was having seizures. -Continue neuro exams per unit protocol -PAD as above -Frequent reorientation  -Minimize sedation -PAD as above -Frequent reorientation  Hypokalemia-improving Hypomagnesemia-resolved Hypophosphatemia-resolved -Replete -Follow-up on a.m. labs  Nutrition -Continue tubefeeds  Hyperglycemia BG 92-178, secondary to steroids -Blood Glucose goal 140-180. -SSI  Best Practice (right click and "Reselect all SmartList Selections" daily)   Diet/type: tubefeeds DVT prophylaxis: systemic heparin GI prophylaxis: PPI Lines: Central line, Arterial Line, and yes and it is still needed Foley:  Yes, and it is still needed Code Status:  full code Last date of multidisciplinary goals of care discussion [husband updated at bedside on 8/1, will call today]   Critical care time: 39 minutes    Redmond School., MSN, APRN, AGACNP-BC [facility]  01/06/2022 , 10:44 AM  Please see [email or web address] for pager details  If no response, please call [phone] After hours, please call Elink at [phone]

## 2022-01-06 NOTE — Progress Notes (Signed)
Increased confusion. States, "The Doreene Adas is coming. They are going to kill you. She is going to kill you too (referring to [place], [place].) She is having hallucinations. She states, "I see him behind you with a gun." There is no one behind me. HR increasing to 120-130s again with agitation. Continuing to monitor and assess frequently. She is alert to person only. Have attempted to reorient her and provide a calming environment without success. Notified [place], [place].

## 2022-01-06 NOTE — Progress Notes (Signed)
ANTICOAGULATION CONSULT NOTE   Pharmacy Consult for heparin Indication: atrial fibrillation  Allergies  Allergen Reactions   Chlorhexidine Gluconate Hives, Swelling and Rash       "ChloraPrep "   Doxycycline Nausea And Vomiting   Codeine Nausea Only and Other (See Comments)    Pt can tolerate when pre-medicated   Doxycycline Nausea And Vomiting   Nsaids     avoid due to gerd   Nsaids Other (See Comments)    Gi bleed   Tylenol [Acetaminophen]     Avoid due to GERD   Adhesive [Tape] Rash    Dermabond   Chlorhexidine Swelling and Rash   Codeine Nausea Only   Tylenol [Acetaminophen] Other (See Comments)    GI bleed    Patient Measurements: Height: 5' (152.4 cm) Weight: 69.7 kg (153 lb 10.6 oz) IBW/kg (Calculated) : 45.5 Heparin Dosing Weight: 60 kg   Vital Signs: Temp: 99.3 F (37.4 C) (08/02 1300) Temp Source: Bladder (08/02 1200) BP: 85/63 (08/02 1300) Pulse Rate: 89 (08/02 1300)  Labs: Recent Labs    01/03/22 2119 01/03/22 2120 01/04/22 0152 01/04/22 0513 01/04/22 1014 01/04/22 1808 01/05/22 0000 01/05/22 0105 01/05/22 0502 01/05/22 1030 01/05/22 1236 01/05/22 2048 01/06/22 0432 01/06/22 0447 01/06/22 1132  HGB 11.8*  --  12.8  --   --   --   --  11.1*  --   --   --   --  11.2* 11.1*  --   HCT 31.8*  --  34.7*  --   --   --   --  31.1*  --   --   --   --  33.0* 32.4*  --   PLT 145*  --  146*  --   --   --   --  140*  --   --   --   --   --  156  --   APTT 39*  --   --   --   --   --    < >  --   --   --    < > 68*  --  34 58*  LABPROT 37.3*  --   --   --   --   --   --   --   --   --   --   --   --   --   --   INR 3.8*  --   --   --   --   --   --   --   --   --   --   --   --   --   --   HEPARINUNFRC  --    < >  --   --   --   --    < >  --   --  >1.10*  --   --   --  1.05* 0.89*  CREATININE 0.68  --  0.73 0.62   < > 0.67  --  0.66  --   --   --   --   --  0.71  --   TROPONINIHS 123*  --   --  131*  --   --   --   --  55*  --   --   --   --   --   --     < > = values in this interval not displayed.     Estimated Creatinine Clearance: 50.5 mL/min (  by C-G formula based on SCr of 0.71 mg/dL).   Medical History: Past Medical History:  Diagnosis Date   Anemia    history of   Anxiety    Arthritis    Asthma    Breast cancer ([place])    Cancer ([place])    skin cancers, one melanoma    Complication of anesthesia    patient woke up during colonoscopy   Concussion    8 yrs. 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    Keratosis, actinic    Melanoma (HCC)    Persistent atrial fibrillation (HCC) 12/16/2016   [id number] score 3   Pneumonia    hx of     Medications:  Scheduled:   arformoterol  15 mcg Nebulization BID   budesonide (PULMICORT) nebulizer solution  0.25 mg Nebulization BID   etomidate       fentaNYL       insulin aspart  0-9 Units Subcutaneous Q4H   ketamine HCl       methylPREDNISolone (SOLU-MEDROL) injection  40 mg Intravenous Q24H   midazolam       midazolam  2 mg Intravenous Once   mouth rinse  15 mL Mouth Rinse 4 times per day   pantoprazole (PROTONIX) IV  40 mg Intravenous QHS   polyethylene glycol  17 g Per Tube Daily   revefenacin  175 mcg Nebulization Daily   rocuronium bromide       succinylcholine        Assessment: 32 yof presenting with acute hypoxemia related to COPD excerbation/PNA complicated by PEA arrest  - on Xarelto PTA for Afib (LD 7/29).   Heparin level still remains elevated at 0.89, aPTT subtherapeutic at 58, on 750 units/hr. No s/sx of bleeding or infusion issues.  Given recent DOAC will monitor both aPTT and heparin level until correlate.   Goal of Therapy:   Heparin level 0.3-0.7 units/ml aPTT 66-102 seconds Monitor platelets by anticoagulation protocol: Yes   Plan:  Increase heparin to 850 units/hr Order heparin level in 6 hours  Monitor heparin level, CBC, and for s/sx of bleeding   Antonietta Jewel, PharmD, [place] Pharmacist  Phone: [phone] 01/06/2022 2:05 PM  Please check AMION for all [place] phone numbers After 10:00 PM, call [place] [phone]

## 2022-01-06 NOTE — Progress Notes (Signed)
Progress Note  Patient Name: [name] Date of Encounter: 01/06/2022  Primary Cardiologist:   Kirk Ruths, MD   Subjective   Events of early AM noted with AMS and neurology evaluation.  Intubated and sedated.    Inpatient Medications    Scheduled Meds:  arformoterol  15 mcg Nebulization BID   budesonide (PULMICORT) nebulizer solution  0.25 mg Nebulization BID   insulin aspart  0-9 Units Subcutaneous Q4H   methylPREDNISolone (SOLU-MEDROL) injection  40 mg Intravenous Q24H   midazolam  2 mg Intravenous Once   mouth rinse  15 mL Mouth Rinse Q2H   pantoprazole (PROTONIX) IV  40 mg Intravenous QHS   polyethylene glycol  17 g Per Tube Daily   revefenacin  175 mcg Nebulization Daily   Continuous Infusions:  azithromycin (ZITHROMAX) 500 mg in sodium chloride 0.9 % 250 mL IVPB Stopped (01/05/22 2144)   ceFEPime (MAXIPIME) IV Stopped (01/05/22 2227)   dexmedetomidine (PRECEDEX) IV infusion 0.5 mcg/kg/hr (01/06/22 0800)   feeding supplement (VITAL AF 1.2 CAL) Stopped (01/05/22 2150)   fentaNYL infusion INTRAVENOUS Stopped (01/06/22 0758)   heparin 750 Units/hr (01/06/22 0800)   norepinephrine (LEVOPHED) Adult infusion 2 mcg/min (01/06/22 0800)   PRN Meds: acetaminophen, albuterol, fentaNYL (SUBLIMAZE) injection, melatonin, midazolam, mouth rinse, polyethylene glycol   Vital Signs    Vitals:   01/06/22 0615 01/06/22 0630 01/06/22 0645 01/06/22 0800  BP: (!) 85/56 93/63 106/74 90/70  Pulse: 63 64 89 78  Resp: '14 14 16 17  '$ Temp: 98.4 F (36.9 C) 98.4 F (36.9 C) 98.4 F (36.9 C) 98.2 F (36.8 C)  TempSrc:    Bladder  SpO2: 100% 100% 96% 98%  Weight:      Height:        Intake/Output Summary (Last 24 hours) at 01/06/2022 0831 Last data filed at 01/06/2022 0800 Gross per 24 hour  Intake 2107.51 ml  Output 475 ml  Net 1632.51 ml   Filed Weights   01/04/22 0500 01/05/22 0446 01/06/22 0500  Weight: 67.4 kg 66.8 kg 69.7 kg    Telemetry    Atrial fib with tachy  and brady rates but overall controlled  - Personally Reviewed  ECG    NA - Personally Reviewed  Physical Exam   GEN: No  acute distress.   Neck: No  JVD Cardiac: Irregular RR, soft systolic murmurs, rubs, or gallops.  Respiratory:      Decreased breath sounds right greater than left GI: Soft, non-distended, decreased bowel sounds MS:  No edema; No deformity. Neuro:   Nonfocal  Psych: Oriented and appropriate    Labs    Chemistry Recent Labs  Lab 01/04/22 0152 01/04/22 0513 01/04/22 1808 01/05/22 0105 01/06/22 0432 01/06/22 0447  NA 127*   < > 129* 130* 132* 132*  K 4.2   < > 3.4* 3.3* 3.5 3.5  CL 90*   < > 93* 96*  --  100  CO2 25   < > 25 24  --  25  GLUCOSE 151*   < > 143* 192*  --  113*  BUN 15   < > 15 15  --  22  CREATININE 0.73   < > 0.67 0.66  --  0.71  CALCIUM 9.0   < > 9.1 9.0  --  8.9  PROT 5.9*  --  5.6* 5.3*  --  5.8*  ALBUMIN 2.4*  --   --  2.1*  --  2.3*  AST 69*  --   --  25  --  31  ALT 26  --   --  21  --  22  ALKPHOS 64  --   --  52  --  77  BILITOT 2.1*  --   --  1.3*  --  1.1  GFRNONAA >60   < > >60 >60  --  >60  ANIONGAP 12   < > 11 10  --  7   < > = values in this interval not displayed.     Hematology Recent Labs  Lab 01/04/22 0152 01/05/22 0105 01/06/22 0432 01/06/22 0447  WBC 18.8* 15.7*  --  17.6*  RBC 3.48* 3.07*  --  3.04*  HGB 12.8 11.1* 11.2* 11.1*  HCT 34.7* 31.1* 33.0* 32.4*  MCV 99.7 101.3*  --  106.6*  MCH 36.8* 36.2*  --  36.5*  MCHC 36.9* 35.7  --  34.3  RDW 12.4 12.8  --  13.3  PLT 146* 140*  --  156    Cardiac EnzymesNo results for input(s): "TROPONINI" in the last 168 hours. No results for input(s): "TROPIPOC" in the last 168 hours.   BNP Recent Labs  Lab 12/11/2021 1813 12/17/2021 2249  BNP 311.6* 479.9*     DDimer  Recent Labs  Lab 01/02/22 1308  DDIMER 1.11*     Radiology    DG CHEST PORT 1 VIEW  Result Date: 01/06/2022 CLINICAL DATA:  Patient removed chest tube. EXAM: PORTABLE CHEST 1 VIEW  COMPARISON:  01/05/2022 FINDINGS: Interval removal of right basilar chest tube. No pneumothorax visualized. There is a ET tube with tip above the carina. Left IJ catheter tip is in the cavoatrial junction. Stable cardiomediastinal contours. Asymmetric elevation of the right hemidiaphragm. Opacity within the right midlung and right base appears increased from the previous exam. Subsegmental atelectasis noted in the left base. IMPRESSION: 1. Interval removal of right chest tube. No pneumothorax visualized. 2. Worsening aeration to the right midlung and right base. Persistent left base atelectasis. Electronically Signed   By: Kerby Moors M.D.   On: 01/06/2022 07:06   CT HEAD WO CONTRAST (5MM)  Result Date: 01/06/2022 CLINICAL DATA:  Neuro deficit, acute, stroke suspected. EXAM: CT HEAD WITHOUT CONTRAST TECHNIQUE: Contiguous axial images were obtained from the base of the skull through the vertex without intravenous contrast. RADIATION DOSE REDUCTION: This exam was performed according to the departmental dose-optimization program which includes automated exposure control, adjustment of the mA and/or kV according to patient size and/or use of iterative reconstruction technique. COMPARISON:  None Available. FINDINGS: Brain: No acute intracranial hemorrhage, midline shift or mass effect. No extra-axial fluid collection. Generalized atrophy is noted. Periventricular white matter hypodensities are noted bilaterally. No hydrocephalus. Vascular: Atherosclerotic calcification of the carotid siphons. No hyperdense vessel. Skull: Normal. Negative for fracture or focal lesion. Sinuses/Orbits: There is opacification of the right maxillary sinus, bilateral sphenoid sinuses, and partial opacification of the ethmoid air cells. No acute orbital abnormality. Other: Endotracheal and enteric tubes are noted in the oral cavity. IMPRESSION: 1. No acute intracranial process. 2. Atrophy with chronic microvascular ischemic changes.  Electronically Signed   By: Brett Fairy M.D.   On: 01/06/2022 04:08   DG Chest Port 1 View  Result Date: 01/05/2022 CLINICAL DATA:  Acute respiratory failure and ETT placement EXAM: PORTABLE CHEST 1 VIEW COMPARISON:  Radiographs 01/05/2022 at 5:37 a.m. FINDINGS: Stable cardiomediastinal silhouette. Unchanged position of the endotracheal and enteric tubes. The right-sided chest tube has been repositioned with tip now in the  right lung base. Left IJ CVC tip in the right atrium. Bibasilar atelectasis. Possible small bilateral pleural effusions. No acute osseous abnormality. IMPRESSION: Repositioning of the right chest tube. Otherwise stable lines and tubes. Bibasilar opacities favored to represent atelectasis. Electronically Signed   By: Placido Sou M.D.   On: 01/05/2022 22:45   DG CHEST PORT 1 VIEW  Result Date: 01/05/2022 CLINICAL DATA:  Respiratory distress. EXAM: PORTABLE CHEST 1 VIEW COMPARISON:  January 04, 2022. FINDINGS: Stable cardiomegaly. Endotracheal and nasogastric tubes are unchanged in position. Right-sided chest tube is noted without pneumothorax. Right basilar atelectasis is noted. Minimal left basilar subsegmental atelectasis is noted. Bony thorax is unremarkable. IMPRESSION: Stable support apparatus.  Bibasilar opacities as described above. Electronically Signed   By: Marijo Conception M.D.   On: 01/05/2022 08:24   DG CHEST PORT 1 VIEW  Result Date: 01/04/2022 CLINICAL DATA:  Respiratory distress EXAM: PORTABLE CHEST 1 VIEW COMPARISON:  Chest x-ray 01/04/2022.  Chest CT 01/03/2022. FINDINGS: Endotracheal tube tip is 3.5 cm above the carina. Left-sided central venous catheter tip projects over the distal SVC. New pigtail catheter overlies the right chest. The cardiomediastinal silhouette is within normal limits. Right pleural effusion has decreased. Small right pleural effusion persists. No evidence for pneumothorax or mediastinal shift. Patchy opacities in the lower lungs are present,  right greater than left. Osseous structures are stable. IMPRESSION: 1. New right-sided pigtail catheter. 2. Right pleural effusion has significantly decreased. Small right pleural effusion persists. 3. Bibasilar atelectasis/airspace disease. Electronically Signed   By: Ronney Asters M.D.   On: 01/04/2022 15:56   ECHOCARDIOGRAM COMPLETE  Result Date: 01/04/2022    ECHOCARDIOGRAM REPORT   Patient Name:   [name] Date of Exam: 01/04/2022 Medical Rec #:  [id number]       Height:       60.0 in Accession #:    [id number]      Weight:       148.6 lb Date of Birth:  12/22/1940       BSA:          1.645 m Patient Age:    81 years        BP:           120/68 mmHg Patient Gender: F               HR:           71 bpm. Exam Location:  Inpatient Procedure: 2D Echo, Cardiac Doppler, Color Doppler and Intracardiac            Opacification Agent STAT ECHO Indications:    Cardiac arrest I46.9  History:        Patient has prior history of Echocardiogram examinations, most                 recent 12/29/2020. Arrythmias:Atrial Fibrillation; Risk                 Factors:Hypertension and GERD.  Sonographer:    Bernadene Person RDCS Referring Phys: [id number] Candee Furbish  Sonographer Comments: Echo performed with patient supine and on artificial respirator. IMPRESSIONS  1. All mid-to-apical LV segments are akinetic. The basal LV segments demonstrate normal contractility. Findings most consistent with Takostubo cardiomyopathy . No LV thrombus visualized.  2. Left ventricular ejection fraction, by estimation, is 25 to 30%. The left ventricle has severely decreased function. The left ventricle demonstrates regional wall motion abnormalities (see scoring diagram/findings for description). Diastolic function  indeterminant  due to Afib.  3. Right ventricular systolic function is mildly reduced. The right ventricular size is mildly enlarged. There is mildly elevated pulmonary artery systolic pressure. The estimated right ventricular  systolic pressure is 16.0 mmHg.  4. Left atrial size was mildly dilated.  5. Right atrial size was moderately dilated.  6. The mitral valve is grossly normal. Moderate mitral valve regurgitation.  7. Tricuspid valve regurgitation is mild to moderate.  8. The aortic valve is tricuspid. There is mild calcification of the aortic valve. There is mild thickening of the aortic valve. Aortic valve regurgitation is mild to moderate. Aortic valve sclerosis/calcification is present, without any evidence of aortic stenosis. Comparison(s): Compared to prior TTE, the LVEF has dropped to 25% (previously 55%). Mitral regurgitation now appears moderate (likely functional) which was previously trivial. FINDINGS  Left Ventricle: All mid-to-apical LV segments are akinetic. The basal LV segments demonstrate normal contractility. Findings most consistent with Takostubo cardiomyopathy. Left ventricular ejection fraction, by estimation, is 25 to 30%. The left ventricle has severely decreased function. The left ventricle demonstrates regional wall motion abnormalities. Definity contrast agent was given IV to delineate the left ventricular endocardial borders. The left ventricular internal cavity size was normal in size. There is no left ventricular hypertrophy. Diastolic function indeterminant due to Afib. Right Ventricle: The right ventricular size is mildly enlarged. No increase in right ventricular wall thickness. Right ventricular systolic function is mildly reduced. There is mildly elevated pulmonary artery systolic pressure. The tricuspid regurgitant  velocity is 3.03 m/s, and with an assumed right atrial pressure of 8 mmHg, the estimated right ventricular systolic pressure is 10.9 mmHg. Left Atrium: Left atrial size was mildly dilated. Right Atrium: Right atrial size was moderately dilated. Pericardium: Trivial pericardial effusion is present. The pericardial effusion is circumferential. Mitral Valve: The mitral valve is grossly  normal. There is mild thickening of the mitral valve leaflet(s). There is mild calcification of the mitral valve leaflet(s). Moderate mitral valve regurgitation. Tricuspid Valve: The tricuspid valve is normal in structure. Tricuspid valve regurgitation is mild to moderate. Aortic Valve: The aortic valve is tricuspid. There is mild calcification of the aortic valve. There is mild thickening of the aortic valve. Aortic valve regurgitation is mild to moderate. Aortic regurgitation PHT measures 603 msec. Aortic valve sclerosis/calcification is present, without any evidence of aortic stenosis. Pulmonic Valve: The pulmonic valve was normal in structure. Pulmonic valve regurgitation is trivial. Aorta: The aortic root and ascending aorta are structurally normal, with no evidence of dilitation. Venous: IVC assessment for right atrial pressure unable to be performed due to mechanical ventilation. IAS/Shunts: The atrial septum is grossly normal.  LEFT VENTRICLE PLAX 2D LVIDd:         4.20 cm LVIDs:         3.10 cm LV PW:         1.10 cm LV IVS:        1.00 cm LVOT diam:     2.00 cm LV SV:         34 LV SV Index:   20 LVOT Area:     3.14 cm  LV Volumes (MOD) LV vol d, MOD A2C: 83.0 ml LV vol d, MOD A4C: 68.8 ml LV vol s, MOD A2C: 64.9 ml LV vol s, MOD A4C: 48.5 ml LV SV MOD A2C:     18.1 ml LV SV MOD A4C:     68.8 ml LV SV MOD BP:      19.4 ml RIGHT VENTRICLE  RV S prime:     6.53 cm/s TAPSE (M-mode): 1.2 cm LEFT ATRIUM             Index        RIGHT ATRIUM           Index LA diam:        4.50 cm 2.74 cm/m   RA Area:     23.30 cm LA Vol (A2C):   55.5 ml 33.74 ml/m  RA Volume:   73.20 ml  44.49 ml/m LA Vol (A4C):   50.0 ml 30.39 ml/m LA Biplane Vol: 55.4 ml 33.67 ml/m  AORTIC VALVE LVOT Vmax:         51.80 cm/s LVOT Vmean:        34.000 cm/s LVOT VTI:          0.107 m AI PHT:            603 msec AR Vena Contracta: 0.20 cm  AORTA Ao Root diam: 2.80 cm Ao Asc diam:  3.50 cm MR Peak grad:    93.7 mmHg    TRICUSPID VALVE MR  Mean grad:    63.0 mmHg    TR Peak grad:   36.7 mmHg MR Vmax:         484.00 cm/s  TR Vmax:        303.00 cm/s MR Vmean:        384.0 cm/s MR PISA:         1.01 cm     SHUNTS MR PISA Eff ROA: 8 mm        Systemic VTI:  0.11 m MR PISA Radius:  0.40 cm      Systemic Diam: 2.00 cm Gwyndolyn Kaufman MD Electronically signed by Gwyndolyn Kaufman MD Signature Date/Time: 01/04/2022/10:54:03 AM    Final     Cardiac Studies   Echo:  See above.   Patient Profile     81 y.o. female with a hx of atrial fib on propranolol 80 mg qd and Xarelto, HTN, asthma, breast CA, anxiety, who is being seen 01/02/2022 for the evaluation of CHF & rapid atrial fib at the request of Dr Marthenia Rolling.  Assessment & Plan    Atrial fib with RVR:       Rate is well controlled.  Currently on IV heparin.    AI/MR:  Noted on prelim echo review.  Mild/Mod AI likely with at least moderate MR.  (MR worse than previous likely functional.)   Follow clinically.    Acute systolic HF:   EF markedly reduced with possible Takotsubo. Unable to wean pressors.   Net I/O is positive.    Oxygenating well with no change in vent settings.   Thick mucous on suction.  I don't think she is acutely volume overloaded so no diuretic based in this assessment.  I have asked nursing to transduce a CVP.    Respiratory failure:   Chest tube out.  Worsening aeration right mid lung.  Continue support and treatment of pneumonia per the CCM team.     For questions or updates, please contact [facility] Please consult [email or web address] for contact info under Cardiology/STEMI.   Signed, Minus Breeding, MD  01/06/2022, 8:31 AM

## 2022-01-06 NOTE — Consult Note (Signed)
NEUROLOGY CONSULTATION NOTE   Date of service: January 06, 2022 Patient Name: [name] MRN:  [id number] DOB:  1941-03-09 Reason for consult: "upward gaze, unresponsive" Requesting Provider: Jacky Kindle, MD _ _ _   _ __   _ __ _ _  __ __   _ __   __ _  History of Present Illness  [name] is a 81 y.o. female with PMH significant for anemia, afibb on xarelto, GERD, COPD, chronic diastolic CHF, pulm hypertension who initially presented to the hospital with SOB, productive cough, R sided chest pain x 3 days and admitted with sepsis secondary to Pneumonia, hypoxic respiratory failure. She had worsening SOB on 7/30 and then found pulseless. CPR with ROSC in about 7 mins with initial rhythm of PEA arrest. She was transferred to ICU where she was able to follow simple commands despite intubation ans sedation but would easily get agitated.  Tonight, she was restless, dyssynchronous. Started on fentanyl gtt when PRN fentanyl boluses unable to control agitation. She was noted to develop upward gaze and poor responsiveness and neurology consulted for further evaluation.    ROS   Unable to get ROS 2/2 intubation and sedation.  Past History   Past Medical History:  Diagnosis Date   Anemia    history of   Anxiety    Arthritis    Asthma    Breast cancer ([place])    Cancer ([place])    skin cancers, one melanoma    Complication of anesthesia    patient woke up during colonoscopy   Concussion    8 yrs. 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    Keratosis, actinic    Melanoma ([place])    Persistent atrial fibrillation ([place]) 12/16/2016   CHA2DS2VASC score 3    Pneumonia    hx of    Past Surgical History:  Procedure Laterality Date   brachial skin removal due to deforimity Bilateral    BREAST ENHANCEMENT SURGERY Left 11/14/2018   Procedure: LEFT BREAST AUGMENTATION;  Surgeon: [name], MD;  Location: [place];  Service: Plastics;  Laterality: Left;   BREAST RECONSTRUCTION Right 06/26/2018   BREAST RECONSTRUCTION WITH PLACEMENT OF TISSUE EXPANDER AND ALLODERM Right 06/26/2018   Procedure: RIGHT BREAST RECONSTRUCTION WITH PLACEMENT OF TISSUE EXPANDER;  Surgeon: [name], MD;  Location: [place];  Service: Plastics;  Laterality: Right;   BREAST RECONSTRUCTION WITH PLACEMENT OF TISSUE EXPANDER AND FLEX HD (ACELLULAR HYDRATED DERMIS) Right 01/28/2017    RIGHT BREAST RECONSTRUCTION WITH PLACEMENT OF TISSUE EXPANDER AND ALLODERM;  Surgeon: [name], MD;  Location: [place];  Service: Plastics;  Laterality: Right;   BUNIONECTOMY     left    CATARACT EXTRACTION     bil[place] cataract surgery    COLONOSCOPY W/ POLYPECTOMY     JOINT REPLACEMENT     KNEE ARTHROSCOPY     left    LATISSIMUS FLAP TO BREAST Right 06/26/2018   Procedure: LATISSIMUS FLAP TO RIGHT CHEST;  Surgeon: [name], MD;  Location: [place];  Service: Plastics;  Laterality: Right;   MASTECTOMY Right  MASTECTOMY W/ SENTINEL NODE BIOPSY Right 01/28/2017   Procedure: RIGHT TOTAL MASTECTOMY WITH SENTINEL LYMPH NODE BIOPSY;  Surgeon: Excell Seltzer, MD;  Location: [place];  Service: General;  Laterality: Right;   MASTOPEXY Left 11/14/2018   Procedure: LEFT BREAST MASTOPEXY;  Surgeon: [name], MD;  Location: [place] [facility];  Service: Plastics;  Laterality: Left;   MELANOMA EXCISION Left    polyp removed from uterus      REMOVAL OF TISSUE EXPANDER AND PLACEMENT OF IMPLANT Right 11/14/2018   Procedure: REMOVAL OF RIGHT TISSUE EXPANDER AND PLACEMENT OF SALINE IMPLANT;  Surgeon: [name], MD;  Location: [place];   Service: Plastics;  Laterality: Right;   right wrist pinning      skin cancers removed     TISSUE EXPANDER PLACEMENT Right 02/27/2018   Procedure: REMOVAL OF TISSUE EXPANDER;  Surgeon: [name], MD;  Location: [place];  Service: Plastics;  Laterality: Right;   TOTAL KNEE ARTHROPLASTY Right 01/29/2013   Procedure: RIGHT TOTAL KNEE ARTHROPLASTY;  Surgeon: Gearlean Alf, MD;  [facility];  Service: Orthopedics;  Laterality: Right;   TOTAL KNEE ARTHROPLASTY Left 08/29/2017   Procedure: LEFT TOTAL KNEE ARTHROPLASTY;  Surgeon: Gaynelle Arabian, MD;  Location: [facility];  Service: Orthopedics;  Laterality: Left;   Family History  Problem Relation Age of Onset   Hypertension Other    COPD Father    Breast cancer Other        dx in her 49s-30s   Social History   Socioeconomic History   Marital status: Married    Spouse name: Not on file   Number of children: Not on file   Years of education: Not on file   Highest education level: Not on file  Occupational History   Not on file  Tobacco Use   Smoking status: Never   Smokeless tobacco: Never  Vaping Use   Vaping Use: Never used  Substance and Sexual Activity   Alcohol use: Yes    Alcohol/week: 1.0 standard drink of alcohol    Types: 1 Glasses of wine per week    Comment: occ   Drug use: No   Sexual activity: Not on file  Other Topics Concern   Not on file  Social History Narrative   ** Merged History Encounter **       Social Determinants of Health   Financial Resource Strain: Not on file  Food Insecurity: Not on file  Transportation Needs: Not on file  Physical Activity: Not on file  Stress: Not on file  Social Connections: Not on file   Allergies  Allergen Reactions   Chlorhexidine Gluconate Hives, Swelling and Rash       "ChloraPrep "   Doxycycline Nausea And Vomiting   Codeine Nausea Only and Other (See Comments)    Pt can tolerate when pre-medicated   Doxycycline Nausea And Vomiting   Nsaids     avoid  due to gerd   Nsaids Other (See Comments)    Gi bleed   Tylenol [Acetaminophen]     Avoid due to GERD   Adhesive [Tape] Rash    Dermabond   Chlorhexidine Swelling and Rash   Codeine Nausea Only   Tylenol [Acetaminophen] Other (See Comments)    GI bleed    Medications   Medications Prior to Admission  Medication Sig Dispense Refill Last Dose   albuterol (VENTOLIN HFA) 108 (90 Base) MCG/ACT inhaler Inhale 1 puff into the lungs every 6 (six)  hours as needed for wheezing or shortness of breath.   12/25/2021   ALPRAZolam (XANAX) 0.5 MG tablet Take 0.5 mg by mouth daily as needed for anxiety.    12/25/2021   B Complex Vitamins (VITAMIN B COMPLEX PO) Take 1 capsule by mouth daily.   12/26/2021   BREO ELLIPTA 100-25 MCG/INH AEPB Inhale 1 puff into the lungs daily.   12/28/2021   COLLAGEN PO Take 1 capsule by mouth daily.   12/16/2021   desonide (DESOWEN) 0.05 % cream Apply 1 application topically 2 (two) times daily as needed (for skin irritation.).   Past Week   diclofenac sodium (VOLTAREN) 1 % GEL Apply 2 g topically daily as needed (pain).    Past Week   EPINEPHrine 0.3 mg/0.3 mL IJ SOAJ injection Inject 0.3 mg into the muscle as needed for anaphylaxis.   unknown   famotidine (PEPCID) 20 MG tablet Take 1 tablet by mouth as directed.   12/16/2021   fluorouracil (EFUDEX) 5 % cream Apply 1 application  topically daily. Per MD instructions, for skin cancer   Past Week   fluticasone (FLONASE) 50 MCG/ACT nasal spray Place 2 sprays into both nostrils daily.   Past Week   irbesartan (AVAPRO) 150 MG tablet Take 1 tablet (150 mg total) by mouth daily. 90 tablet 3 12/24/2021   letrozole (FEMARA) 2.5 MG tablet TAKE 1 TABLET BY MOUTH EVERY DAY 90 tablet 3 12/28/2021   levocetirizine (XYZAL) 5 MG tablet Take 5 mg by mouth at bedtime.   01/03/2022   Methylsulfonylmethane (MSM PO) Take 1 capsule by mouth daily.   01/02/2022   MILK THISTLE PO Take 1 capsule by mouth daily.   12/27/2021   Misc Natural Products  (PYCNOGENOL COMPLEX PO) Take 1 capsule by mouth daily.   12/31/2021   Omega-3 Fatty Acids (OMEGA 3 PO) Take 1 capsule by mouth daily.   12/09/2021   OVER THE COUNTER MEDICATION Take 1 capsule by mouth daily. Immunity supplement   12/28/2021   oxyCODONE (ROXICODONE) 5 MG immediate release tablet Take 1 tablet (5 mg total) by mouth every 4 (four) hours as needed for severe pain. 20 tablet 0 Past Week   Polyethyl Glycol-Propyl Glycol (SYSTANE OP) Place 1 drop into both eyes daily.   12/21/2021   potassium chloride (KLOR-CON) 20 MEQ packet Take 20 mEq by mouth daily.   12/06/2021   propranolol ER (INDERAL LA) 80 MG 24 hr capsule Take 80 mg by mouth daily.   12/29/2021 at 1100   TURMERIC PO Take 1 capsule by mouth daily.   12/31/2021   venlafaxine XR (EFFEXOR-XR) 75 MG 24 hr capsule TAKE 1 CAPSULE BY MOUTH DAILY WITH BREAKFAST. 90 capsule 0 01/04/2022   VITAMIN D PO Take 1 capsule by mouth daily.   01/02/2022   XARELTO 20 MG TABS tablet TAKE 1 TABLET BY MOUTH DAILY WITH SUPPER (Patient taking differently: Take 20 mg by mouth daily.) 90 tablet 1 12/23/2021 at 1100   furosemide (LASIX) 20 MG tablet Take 1 tablet (20 mg total) by mouth daily as needed. (Patient not taking: Reported on 01/02/2022) 30 tablet 6 Not Taking     Vitals   Vitals:   01/06/22 0245 01/06/22 0300 01/06/22 0315 01/06/22 0316  BP: 98/65 109/67 (!) 89/65   Pulse: 69 66 74   Resp: '12 17 13   '$ Temp: 98.8 F (37.1 C) 98.8 F (37.1 C) 99 F (37.2 C)   TempSrc:      SpO2: 99% 100% 100%  97%  Weight:      Height:         Body mass index is 28.76 kg/m.  Physical Exam   General: Laying comfortably in bed; in no acute distress.  HENT: Normal oropharynx and mucosa. ETT in place. Normal external appearance of ears and nose.  Neck: Supple, no pain or tenderness  CV: No JVD. No peripheral edema.  Pulmonary: Symmetric Chest rise. Seems to initiate breaths by herself. Abdomen: Soft to touch, non-tender.  Ext: No cyanosis, edema, or  deformity  Skin: No rash. Normal palpation of skin.   Musculoskeletal: Normal digits and nails by inspection. No clubbing.   Neurologic Examination  Mental status/Cognition: no response to voice or loud clap, no response to nares stimulation. Speech/language: none, mute. No attempts to communicate. Cranial nerves:   CN II Pupils about 72m, round with sluggish response to light.   CN III,IV,VI Upwards gaze, dolls eyes absent.   CN V Corneals absent.   CN VII No grimace to nares simulation.   CN VIII Does not turn head towards speech   CN IX & X Cough absent, gag absent.   CN XI Head midline.   CN XII midline tongue.   Motor/sensory:  Muscle bulk: poor, tone flaccid in all extremities No response to pinch in any extremities, no movement noted.  Reflexes:  Right Left Comments  Pectoralis      Biceps (C5/6) 1 1   Brachioradialis (C5/6) 1 1    Triceps (C6/7) 1 1    Patellar (L3/4) 2 2    Achilles (S1)      Hoffman      Plantar     Jaw jerk    Coordination/Complex Motor:  Unable to assess.  Labs   CBC:  Recent Labs  Lab 12/07/2021 2249 01/03/22 1741 01/04/22 0152 01/05/22 0105 01/06/22 0432  WBC 9.5   < > 18.8* 15.7*  --   NEUTROABS 8.3*  --   --  13.5*  --   HGB 14.7   < > 12.8 11.1* 11.2*  HCT 41.9   < > 34.7* 31.1* 33.0*  MCV 103.5*   < > 99.7 101.3*  --   PLT 153   < > 146* 140*  --    < > = values in this interval not displayed.    Basic Metabolic Panel:  Lab Results  Component Value Date   NA 132 (L) 01/06/2022   K 3.5 01/06/2022   CO2 24 01/05/2022   GLUCOSE 192 (H) 01/05/2022   BUN 15 01/05/2022   CREATININE 0.66 01/05/2022   CALCIUM 9.0 01/05/2022   GFRNONAA >60 01/05/2022   GFRAA 90 04/24/2020   Lipid Panel:  Lab Results  Component Value Date   LDLCALC 56 04/28/2021   HgbA1c:  Lab Results  Component Value Date   HGBA1C 4.9 01/03/2022   Urine Drug Screen:     Component Value Date/Time   LABOPIA NONE DETECTED 12/28/2020 2017    COCAINSCRNUR NONE DETECTED 12/28/2020 2017   LABBENZ NONE DETECTED 12/28/2020 2017   AMPHETMU NONE DETECTED 12/28/2020 2017   THCU NONE DETECTED 12/28/2020 2017   LABBARB NONE DETECTED 12/28/2020 2017    Alcohol Level     Component Value Date/Time   ETH 183 (H) 12/28/2020 1641    CT Head without contrast(Personally reviewed): CTH was negative for a large hypodensity concerning for a large territory infarct or hyperdensity concerning for an ICH  MRI Brain: pending  cEEG:  pending  Impression  MARYAH MARINARO is a 81 y.o. female with PMH significant for anemia, afibb on xarelto, GERD, COPD, chronic diastolic CHF, pulm hypertension who is admitted to the ICU with sepsis and hypoxic respiratory failure R pneumonia, intubated and chest tube in place, PEA arrest 3 days ago with 61mns CPR with ROSC and following simple commands yesterday.  Tonight, had an abrupt change. Earlier agitated and given multiple fentanyl boluses and started on fentanyl gtt for restlessness, vent dyssynchrony and almost pulled out her R chest tube. Was noted later in night to be poorly responsive with upwards gaze.  On my evaluation initially, pupils 338mBL, round with sluggish response to light with upgaze with rest of the brainstem reflexes absent.  CT Head negative for ICH, unable to get her to lie flat for long enough to get a CTA, she would desat to low 80s shortly after lying flat.  On repeat evaluation about an hour after stopping fentanyl and precedex, she woke up, thrashing and makes eye contact. Sedation resumed. We were initially planning cEEG but my overall suspicion for this episode being seizure is low. Will still get a routine EEG on her thou.  Recommendations  - recommend judicious use of opiods and sedation. - rEEG - management of rest of the comorbidities per primary team. ______________________________________________________________________  This patient is critically ill and at  significant risk of neurological worsening, death and care requires constant monitoring of vital signs, hemodynamics,respiratory and cardiac monitoring, neurological assessment, discussion with family, other specialists and medical decision making of high complexity. I spent 60 minutes of neurocritical care time  in the care of  this patient. This was time spent independent of any time provided by nurse practitioner or PA.  SaDonnetta [facility] Pager Number [phone]/07/2021  5:30 AM   Thank you for the opportunity to take part in the care of this patient. If you have any further questions, please contact the neurology consultation attending.  Signed,  SaAmherstager Number [phone] _ _   _ __   _ __ _ _  __ __   _ __   __ _

## 2022-01-06 NOTE — Progress Notes (Signed)
RT transported pt from 2H14 to CT2 and back with no apparent complications. Rnx2, RT at bedside, vitals stable throughout transport. RT will continue to monitor as needed.

## 2022-01-06 NOTE — Procedures (Signed)
Patient Name: [name]  MRN: [id number]  Epilepsy Attending: Lora Havens  Referring Physician/Provider: Frederik Pear, MD Date: 01/06/2022 Duration: 24.40 mins  Patient history: 81 year old female with cardiac arrest.  EEG to evaluate for seizure.  Level of alertness: lethargic   AEDs during EEG study: None  Technical aspects: This EEG study was done with scalp electrodes positioned according to the 10-20 International system of electrode placement. Electrical activity was reviewed with band pass filter of 1-'70Hz'$ , sensitivity of 7 uV/mm, display speed of 66m/sec with a '60Hz'$  notched filter applied as appropriate. EEG data were recorded continuously and digitally stored.  Video monitoring was available and reviewed as appropriate.  Description: No clear posterior dominant rhythm was seen.  EEG showed continuous generalized 3 to 6 Hz theta-delta Slowing at times with triphasic morphology.  Hyperventilation and photic stimulation were not performed.     ABNORMALITY - Continuous slow, generalized  IMPRESSION: This study is suggestive of moderate to severe diffuse encephalopathy, nonspecific etiology. No seizures or epileptiform discharges were seen throughout the recording.  Fount Bahe OBarbra Sarks

## 2022-01-06 NOTE — Progress Notes (Signed)
Patient is gone to imaging will check back for availability of patient , for EEG.

## 2022-01-06 NOTE — Progress Notes (Addendum)
[facility] Progress Note Patient Name: LUCETTA BAEHR DOB: July 07, 1940 MRN: [id number]   Date of Service  01/06/2022  HPI/Events of Note  Atrial fibrillation with RVR, patient extubated earlier today and is currently NPO.  eICU Interventions  Metoprolol 2.5 mg iv Q 6 hours PRN heart rate > 115 until Po intake resumes. PRN Tylenol suppository until po intake resumes. Morphine  1 mg iv x 1.        Kerry Kass Margeret Stachnik 01/06/2022, 8:25 PM

## 2022-01-07 DIAGNOSIS — I4891 Unspecified atrial fibrillation: Secondary | ICD-10-CM

## 2022-01-07 DIAGNOSIS — J9601 Acute respiratory failure with hypoxia: Secondary | ICD-10-CM | POA: Diagnosis not present

## 2022-01-07 DIAGNOSIS — I469 Cardiac arrest, cause unspecified: Secondary | ICD-10-CM | POA: Diagnosis not present

## 2022-01-07 DIAGNOSIS — J189 Pneumonia, unspecified organism: Secondary | ICD-10-CM | POA: Diagnosis not present

## 2022-01-07 LAB — BASIC METABOLIC PANEL
Anion gap: 7 (ref 5–15)
BUN: 23 mg/dL (ref 8–23)
CO2: 27 mmol/L (ref 22–32)
Calcium: 8.8 mg/dL — ABNORMAL LOW (ref 8.9–10.3)
Chloride: 100 mmol/L (ref 98–111)
Creatinine, Ser: 0.66 mg/dL (ref 0.44–1.00)
GFR, Estimated: >60 mL/min (ref >60)
Glucose, Bld: 106 mg/dL — ABNORMAL HIGH (ref 70–99)
Potassium: 3.4 mmol/L — ABNORMAL LOW (ref 3.5–5.1)
Sodium: 134 mmol/L — ABNORMAL LOW (ref 135–145)

## 2022-01-07 LAB — CBC
HCT: 31.2 % — ABNORMAL LOW (ref 36.0–46.0)
Hemoglobin: 11.1 g/dL — ABNORMAL LOW (ref 12.0–15.0)
MCH: 36.9 pg — ABNORMAL HIGH (ref 26.0–34.0)
MCHC: 35.6 g/dL (ref 30.0–36.0)
MCV: 103.7 fL — ABNORMAL HIGH (ref 80.0–100.0)
Platelets: 132 10*3/uL — ABNORMAL LOW (ref 150–400)
RBC: 3.01 MIL/uL — ABNORMAL LOW (ref 3.87–5.11)
RDW: 13 % (ref 11.5–15.5)
WBC: 12.3 10*3/uL — ABNORMAL HIGH (ref 4.0–10.5)
nRBC: 0.2 % (ref 0.0–0.2)

## 2022-01-07 LAB — GLUCOSE, CAPILLARY
Glucose-Capillary: 111 mg/dL — ABNORMAL HIGH (ref 70–99)
Glucose-Capillary: 71 mg/dL (ref 70–99)
Glucose-Capillary: 66 mg/dL — ABNORMAL LOW (ref 70–99)
Glucose-Capillary: 195 mg/dL — ABNORMAL HIGH (ref 70–99)
Glucose-Capillary: 127 mg/dL — ABNORMAL HIGH (ref 70–99)
Glucose-Capillary: 124 mg/dL — ABNORMAL HIGH (ref 70–99)

## 2022-01-07 LAB — APTT
aPTT: 95 seconds — ABNORMAL HIGH (ref 24–36)
aPTT: 95 seconds — ABNORMAL HIGH (ref 24–36)

## 2022-01-07 LAB — HEPARIN LEVEL (UNFRACTIONATED): Heparin Unfractionated: 0.8 IU/mL — ABNORMAL HIGH (ref 0.30–0.70)

## 2022-01-07 MED ORDER — DEXMEDETOMIDINE HCL IN NACL 400 MCG/100ML IV SOLN
0.1000 ug/kg/h | INTRAVENOUS | Status: AC
  Administered 2022-01-07 (×3): 0.6 ug/kg/h via INTRAVENOUS
  Administered 2022-01-07: 0.1 ug/kg/h via INTRAVENOUS
  Filled 2022-01-07 (×4): qty 100

## 2022-01-07 MED ORDER — DEXTROSE 50 % IV SOLN
12.5000 g | INTRAVENOUS | Status: AC
  Administered 2022-01-07: 12.5 g via INTRAVENOUS

## 2022-01-07 MED ORDER — FUROSEMIDE 10 MG/ML IJ SOLN
20.0000 mg | Freq: Once | INTRAMUSCULAR | Status: AC
  Administered 2022-01-07: 20 mg via INTRAVENOUS

## 2022-01-07 MED ORDER — POLYETHYLENE GLYCOL 3350 17 G PO PACK: 17.0000 g | PACK | Freq: Every day | ORAL | Status: DC

## 2022-01-07 MED ORDER — OLANZAPINE 5 MG PO TBDP
2.5000 mg | ORAL_TABLET | Freq: Once | ORAL | Status: DC
  Filled 2022-01-07: qty 0.5

## 2022-01-07 MED ORDER — FUROSEMIDE 10 MG/ML IJ SOLN
40.0000 mg | Freq: Once | INTRAMUSCULAR | Status: DC
Start: 2022-01-07 — End: 2022-01-07
  Filled 2022-01-07: qty 4

## 2022-01-07 MED ORDER — POTASSIUM CHLORIDE 10 MEQ/50ML IV SOLN
10.0000 meq | INTRAVENOUS | Status: AC
  Administered 2022-01-07 (×4): 10 meq via INTRAVENOUS
  Filled 2022-01-07 (×4): qty 50

## 2022-01-07 MED ORDER — DEXMEDETOMIDINE HCL IN NACL 400 MCG/100ML IV SOLN: 0.4000 ug/kg/h | INTRAVENOUS | Status: DC

## 2022-01-07 MED ORDER — DEXTROSE 50 % IV SOLN
INTRAVENOUS | Status: AC
  Filled 2022-01-07: qty 50

## 2022-01-07 NOTE — Progress Notes (Signed)
Started Precedex gtt per order. She is hallucinating when I get near her to connect the IV tubing. She states, "No.Get away from me, you are going to vaccinate me. What is this apocalypse? Did you do something to me? I did not kill anybody. They need to check that real quick." States, " I am in a jam set up," when asked if she knows where she is at. HR 120 Afibb. Bp 136/83. Sats 98% 3L [place]. RR 14.  At bedside continuing to monitor.

## 2022-01-07 NOTE — TOC Initial Note (Signed)
Transition of Care [facility]) - Initial/Assessment Note    Patient Details  Name: [name] MRN: [id number] Date of Birth: [birth year]  [facility] CM/SW Contact:    Milas Gain, [place] Phone Number: 01/07/2022, 4:36 PM  Clinical Narrative:                  CSW received consult for possible SNF placement at time of discharge. Due to patients current orientation CSW spoke with patients spouse [name] regarding PT recommendation of SNF placement at time of discharge. Patient comes from home with spouse.Patients spouse expressed understanding of PT recommendation and is agreeable to SNF placement for patient at time of discharge. Patients spouse gave CSW permission to fax out initial referral near the [place] area. CSW discussed insurance authorization process with patients spouse.  Patients spouse reports patient has received the COVID vaccines as well as 3 boosters  No further questions reported at this time. CSW to continue to follow and assist with discharge planning needs.    Expected Discharge Plan: Skilled Nursing Facility Barriers to Discharge: Continued Medical Work up   Patient Goals and CMS Choice   CMS Medicare.gov Compare Post Acute Care list provided to:: Patient Represenative (must comment) (Patients spouse) Choice offered to / list presented to : Spouse  Expected Discharge Plan and Services Expected Discharge Plan: Skilled Nursing Facility In-house Referral: Clinical Social Work Discharge Planning Services: CM Consult Post Acute Care Choice: [place] arrangements for the past 2 months: Single Family Home                                      Prior Living Arrangements/Services Living arrangements for the past 2 months: Single Family Home Lives with:: Spouse Patient language and need for interpreter reviewed:: Yes Do you feel safe going back to the place where you live?: No   SNF  Need for Family Participation in Patient Care: Yes  (Comment) Care giver support system in place?: Yes (comment)   Criminal Activity/Legal Involvement Pertinent to Current Situation/Hospitalization: No - Comment as needed  Activities of Daily Living Home Assistive Devices/Equipment: None ADL Screening (condition at time of admission) Patient's cognitive ability adequate to safely complete daily activities?: Yes Is the patient deaf or have difficulty hearing?: No Does the patient have difficulty seeing, even when wearing glasses/contacts?: No Does the patient have difficulty concentrating, remembering, or making decisions?: No Patient able to express need for assistance with ADLs?: Yes Does the patient have difficulty dressing or bathing?: No Independently performs ADLs?: Yes (appropriate for developmental age) Does the patient have difficulty walking or climbing stairs?: Yes Weakness of Legs: Both Weakness of Arms/Hands: Both  Permission Sought/Granted Permission sought to share information with : Case Manager, Family Supports, Chartered certified accountant granted to share information with : No  Share Information with NAME: Due to patients current orientation CSW spoke with patients spouse  Permission granted to share info w AGENCY: SNF  Permission granted to share info w Relationship: spouse  Permission granted to share info w Contact Information: [name] [phone]  Emotional Assessment       Orientation: : Oriented to Self Alcohol / Substance Use: Not Applicable Psych Involvement: No (comment)  Admission diagnosis:  CAP (community acquired pneumonia) [J18.9] Patient Active Problem List   Diagnosis Date Noted   Cardiac arrest ([place])    Acute respiratory failure with hypoxia ([place])  Pneumonia of right lower lobe due to infectious organism    CAP (community acquired pneumonia) 12/24/2021   A-fib ([place]) 12/29/2020   Asthma, chronic, unspecified asthma severity, with acute exacerbation 12/29/2020   GAD  (generalized anxiety disorder) 12/29/2020   History of breast cancer 12/29/2020   GERD (gastroesophageal reflux disease) 12/29/2020   Acute lower UTI 12/29/2020   Lactic acid increased 12/29/2020   Syncope 12/28/2020   Hypotension 12/28/2020   Hyponatremia 12/28/2020   History of breast cancer 06/26/2018   Genetic testing 12/24/2016   Persistent atrial fibrillation ([place]) 12/16/2016   Long term current use of anticoagulant therapy 12/16/2016   Hypertensive heart disease without CHF    Family history of breast cancer    Melanoma ([place])    Malignant neoplasm of upper-inner quadrant of right breast in female, estrogen receptor positive ([place]) 11/19/2016   OA (osteoarthritis) of knee 01/29/2013   PCP:  [facility] [place]:   [id number] [place] [place] [place]Phone: [phone]: [phone] [id number] [address] [place] [place] - [address] [id number]: [phone]: [address] [facility]: [phone]: [phone]   Social Determinants of Health (SDOH) Interventions    Readmission Risk Interventions     No data to display

## 2022-01-07 NOTE — Progress Notes (Signed)
Progress Note  Patient Name: [name] Date of Encounter: 01/07/2022  Primary Cardiologist:   Kirk Ruths, MD   Subjective   Extubated, significant confusion and agitation reported overnight.  Unable to answer questions today.     Inpatient Medications    Scheduled Meds:  arformoterol  15 mcg Nebulization BID   budesonide (PULMICORT) nebulizer solution  0.25 mg Nebulization BID   insulin aspart  0-9 Units Subcutaneous Q4H   methylPREDNISolone (SOLU-MEDROL) injection  40 mg Intravenous Q24H   metoprolol tartrate  25 mg Oral BID   midazolam  2 mg Intravenous Once   OLANZapine zydis  2.5 mg Oral Once   mouth rinse  15 mL Mouth Rinse 4 times per day   pantoprazole (PROTONIX) IV  40 mg Intravenous QHS   polyethylene glycol  17 g Per Tube Daily   revefenacin  175 mcg Nebulization Daily   Continuous Infusions:  ceFEPime (MAXIPIME) IV 2 g (01/06/22 2112)   dexmedetomidine (PRECEDEX) IV infusion 0.6 mcg/kg/hr (01/07/22 0730)   feeding supplement (VITAL AF 1.2 CAL) Stopped (01/05/22 2150)   heparin 900 Units/hr (01/07/22 0730)   norepinephrine (LEVOPHED) Adult infusion Stopped (01/06/22 1445)   potassium chloride 50 mL/hr at 01/07/22 0730   PRN Meds: acetaminophen, albuterol, melatonin, metoprolol tartrate, midazolam, mouth rinse, polyethylene glycol   Vital Signs    Vitals:   01/07/22 0500 01/07/22 0607 01/07/22 0700 01/07/22 0730  BP: 99/73 (!) 110/56 (!) 94/59   Pulse: (!) 102 89 79 78  Resp: '20 19 19 20  '$ Temp: 99.3 F (37.4 C) 99.1 F (37.3 C) 99 F (37.2 C) 99.1 F (37.3 C)  TempSrc:    Bladder  SpO2: 99% 100% 100% 94%  Weight:      Height:        Intake/Output Summary (Last 24 hours) at 01/07/2022 0808 Last data filed at 01/07/2022 0730 Gross per 24 hour  Intake 783.48 ml  Output 2490 ml  Net -1706.52 ml   Filed Weights   01/05/22 0446 01/06/22 0500 01/07/22 0404  Weight: 66.8 kg 69.7 kg 66.7 kg    Telemetry    Atrial fib with RVR - Personally  Reviewed  ECG    NA - Personally Reviewed  Physical Exam   GEN: No  acute distress.   Neck: No  JVD Cardiac: Irregular RR, 2/6 systolic murmur, no diastolic  murmurs, rubs, or gallops.  Respiratory:     Decreased breath sounds right greater than left GI: Soft, nontender, non-distended, normal bowel sounds  MS:  No edema; No deformity. Neuro:   Nonfocal  Psych:   Very confused   Labs    Chemistry Recent Labs  Lab 01/04/22 0152 01/04/22 0513 01/04/22 1808 01/05/22 0105 01/06/22 0432 01/06/22 0447 01/07/22 0408  NA 127*   < > 129* 130* 132* 132* 134*  K 4.2   < > 3.4* 3.3* 3.5 3.5 3.4*  CL 90*   < > 93* 96*  --  100 100  CO2 25   < > 25 24  --  25 27  GLUCOSE 151*   < > 143* 192*  --  113* 106*  BUN 15   < > 15 15  --  22 23  CREATININE 0.73   < > 0.67 0.66  --  0.71 0.66  CALCIUM 9.0   < > 9.1 9.0  --  8.9 8.8*  PROT 5.9*  --  5.6* 5.3*  --  5.8*  --   ALBUMIN  2.4*  --   --  2.1*  --  2.3*  --   AST 69*  --   --  25  --  31  --   ALT 26  --   --  21  --  22  --   ALKPHOS 64  --   --  52  --  77  --   BILITOT 2.1*  --   --  1.3*  --  1.1  --   GFRNONAA >60   < > >60 >60  --  >60 >60  ANIONGAP 12   < > 11 10  --  7 7   < > = values in this interval not displayed.     Hematology Recent Labs  Lab 01/05/22 0105 01/06/22 0432 01/06/22 0447 01/07/22 0408  WBC 15.7*  --  17.6* 12.3*  RBC 3.07*  --  3.04* 3.01*  HGB 11.1* 11.2* 11.1* 11.1*  HCT 31.1* 33.0* 32.4* 31.2*  MCV 101.3*  --  106.6* 103.7*  MCH 36.2*  --  36.5* 36.9*  MCHC 35.7  --  34.3 35.6  RDW 12.8  --  13.3 13.0  PLT 140*  --  156 132*    Cardiac EnzymesNo results for input(s): "TROPONINI" in the last 168 hours. No results for input(s): "TROPIPOC" in the last 168 hours.   BNP Recent Labs  Lab 12/30/2021 1813 12/24/2021 2249  BNP 311.6* 479.9*     DDimer  Recent Labs  Lab 01/02/22 1308  DDIMER 1.11*     Radiology    EEG adult  Result Date: 01/06/2022 Lora Havens, MD      01/06/2022  9:43 AM Patient Name: [name] MRN: [id number] Epilepsy Attending: Lora Havens Referring Physician/Provider: Frederik Pear, MD Date: 01/06/2022 Duration: 24.40 mins Patient history: 81 year old female with cardiac arrest.  EEG to evaluate for seizure. Level of alertness: lethargic AEDs during EEG study: None Technical aspects: This EEG study was done with scalp electrodes positioned according to the 10-20 International system of electrode placement. Electrical activity was reviewed with band pass filter of 1-'70Hz'$ , sensitivity of 7 uV/mm, display speed of 87m/sec with a '60Hz'$  notched filter applied as appropriate. EEG data were recorded continuously and digitally stored.  Video monitoring was available and reviewed as appropriate. Description: No clear posterior dominant rhythm was seen.  EEG showed continuous generalized 3 to 6 Hz theta-delta Slowing at times with triphasic morphology.  Hyperventilation and photic stimulation were not performed.   ABNORMALITY - Continuous slow, generalized IMPRESSION: This study is suggestive of moderate to severe diffuse encephalopathy, nonspecific etiology. No seizures or epileptiform discharges were seen throughout the recording. PLora Havens  DG CHEST PORT 1 VIEW  Result Date: 01/06/2022 CLINICAL DATA:  Patient removed chest tube. EXAM: PORTABLE CHEST 1 VIEW COMPARISON:  01/05/2022 FINDINGS: Interval removal of right basilar chest tube. No pneumothorax visualized. There is a ET tube with tip above the carina. Left IJ catheter tip is in the cavoatrial junction. Stable cardiomediastinal contours. Asymmetric elevation of the right hemidiaphragm. Opacity within the right midlung and right base appears increased from the previous exam. Subsegmental atelectasis noted in the left base. IMPRESSION: 1. Interval removal of right chest tube. No pneumothorax visualized. 2. Worsening aeration to the right midlung and right base. Persistent left base  atelectasis. Electronically Signed   By: TKerby MoorsM.D.   On: 01/06/2022 07:06   CT HEAD WO CONTRAST (5MM)  Result Date: 01/06/2022 CLINICAL DATA:  Neuro deficit, acute, stroke suspected. EXAM: CT HEAD WITHOUT CONTRAST TECHNIQUE: Contiguous axial images were obtained from the base of the skull through the vertex without intravenous contrast. RADIATION DOSE REDUCTION: This exam was performed according to the departmental dose-optimization program which includes automated exposure control, adjustment of the mA and/or kV according to patient size and/or use of iterative reconstruction technique. COMPARISON:  None Available. FINDINGS: Brain: No acute intracranial hemorrhage, midline shift or mass effect. No extra-axial fluid collection. Generalized atrophy is noted. Periventricular white matter hypodensities are noted bilaterally. No hydrocephalus. Vascular: Atherosclerotic calcification of the carotid siphons. No hyperdense vessel. Skull: Normal. Negative for fracture or focal lesion. Sinuses/Orbits: There is opacification of the right maxillary sinus, bilateral sphenoid sinuses, and partial opacification of the ethmoid air cells. No acute orbital abnormality. Other: Endotracheal and enteric tubes are noted in the oral cavity. IMPRESSION: 1. No acute intracranial process. 2. Atrophy with chronic microvascular ischemic changes. Electronically Signed   By: Brett Fairy M.D.   On: 01/06/2022 04:08   DG Chest Port 1 View  Result Date: 01/05/2022 CLINICAL DATA:  Acute respiratory failure and ETT placement EXAM: PORTABLE CHEST 1 VIEW COMPARISON:  Radiographs 01/05/2022 at 5:37 a.m. FINDINGS: Stable cardiomediastinal silhouette. Unchanged position of the endotracheal and enteric tubes. The right-sided chest tube has been repositioned with tip now in the right lung base. Left IJ CVC tip in the right atrium. Bibasilar atelectasis. Possible small bilateral pleural effusions. No acute osseous abnormality. IMPRESSION:  Repositioning of the right chest tube. Otherwise stable lines and tubes. Bibasilar opacities favored to represent atelectasis. Electronically Signed   By: Placido Sou M.D.   On: 01/05/2022 22:45    Cardiac Studies   Echo:  See above.   Patient Profile     81 y.o. female with a hx of atrial fib on propranolol 80 mg qd and Xarelto, HTN, asthma, breast CA, anxiety, who is being seen 01/02/2022 for the evaluation of CHF & rapid atrial fib at the request of Dr Marthenia Rolling. 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    Assessment & Plan    Atrial fib with RVR:      Started beta blocker. (She was given a dose of IV and PO ordered but not given.) Rate driven by agitation.  Unable to titrate meds.  Currently on IV heparin.    AI/MR:  Noted on prelim echo review.  Mild/Mod AI likely with at least moderate MR.  (MR worse than previous likely functional.)   Follow clinically.    Acute systolic HF:   EF markedly reduced with possible Takotsubo. Pressors off.    CVP was elevated and she was given a dose of Lasix yesterday.  Continue with low dose Lasix today.   Respiratory failure:    Given mild diuretic yesterday with 1817 net out.  Off vent.  Antibiotics per primary team.  For questions or updates, please contact [place] Please consult [email or web address] for contact info under Cardiology/STEMI.   Signed, Minus Breeding, MD  01/07/2022, 8:08 AM

## 2022-01-07 NOTE — Evaluation (Signed)
Occupational Therapy Evaluation Patient Details Name: [name] MRN: [id number] DOB: 07-16-1940 Today's Date: 01/07/2022   History of Present Illness Pt is a 81 y.o. female admitted 12/19/2021 with SOB, productive cough, R-side chest pain. Workup for sepsis secondary to CAP. Pt confused 7/30, head CT negative for acute abnormality. Code blue 7/30 due to episode of unresponsiveness; ROSC achieved after 7 min CPR, pt intubated. Chest tube insertion 7/31. ETT 7/30-8/2. Continued AMS, hallucinations overnight 8/2; head CT 8/2 remains negative for acute abnormality. EEG 8/2 with no seizure activity. PMH includes afib, HTN, breast CA, melanoma, asthma, bilateral TKA, anxiety.   Clinical Impression   Pt typically independent in ADL and mobility. Today Pt is limited by paranoia, AMS, cognitive status. Waxes and waines throughout session - sometimes cooperative other times asking "Do you have a knife, are you trying to kill me?" At this time total A for ADL - despite strength and capability - but AMS impacting so much and too much concern for pulling out lines. See general comments below for "incident" during session. HR in the 130's, SpO2 WFL on 2L throughout session. OT will continue to follow acutely and now, due to cognition recommending SNF post-acute to maximize safety and independence in ADL and functional transfers.       Recommendations for follow up therapy are one component of a multi-disciplinary discharge planning process, led by the attending physician.  Recommendations may be updated based on patient status, additional functional criteria and insurance authorization.   Follow Up Recommendations  Skilled nursing-short term rehab (<3 hours/day) (pending improvement cognitively - potentially HHOT)    Assistance Recommended at Discharge Frequent or constant Supervision/Assistance  Patient can return home with the following A lot of help with walking and/or transfers;A lot of help with  bathing/dressing/bathroom;Assistance with cooking/housework;Assistance with feeding;Direct supervision/assist for medications management;Direct supervision/assist for financial management;Assist for transportation;Help with stairs or ramp for entrance    Functional Status Assessment  Patient has had a recent decline in their functional status and demonstrates the ability to make significant improvements in function in a reasonable and predictable amount of time.  Equipment Recommendations  BSC/3in1 (TBD)    Recommendations for Other Services PT consult;Speech consult     Precautions / Restrictions Precautions Precautions: Other (comment) (agitation/AMS/paranoia) Precaution Comments: Pt will pull at lines Restrictions Weight Bearing Restrictions: No      Mobility Bed Mobility Overal bed mobility: Needs Assistance Bed Mobility: Supine to Sit, Sit to Supine     Supine to sit: Min assist, HOB elevated (slight trunk assist) Sit to supine: Total assist, +2 for physical assistance, +2 for safety/equipment (total A +2 because she did not want to lay back down at all - despite multiple attempts and explanation that it is for her safety)   General bed mobility comments: therapists have to monitor lines VERY closely, Pt has strength and flexibility but mental status impacts ability to perform    Transfers Overall transfer level: Needs assistance Equipment used: 2 person hand held assist Transfers: Sit to/from Stand Sit to Stand: Min guard, +2 safety/equipment, From elevated surface (ICU bed)           General transfer comment: multiple sit<>stands at min guard +2 safety and line management. Paranoia/AMS more debilitating than physical capabilities      Balance Overall balance assessment: Mild deficits observed, not formally tested (due to AMS - will continue to evaluate)  ADL either performed or assessed with clinical  judgement   ADL Overall ADL's : Needs assistance/impaired                                       General ADL Comments: total A due to agitation at this time.     Vision         Perception     Praxis      Pertinent Vitals/Pain Pain Assessment Faces Pain Scale: No hurt Pain Intervention(s): Monitored during session, Repositioned     Hand Dominance Right   Extremity/Trunk Assessment Upper Extremity Assessment Upper Extremity Assessment: Overall WFL for tasks assessed   Lower Extremity Assessment Lower Extremity Assessment: Defer to PT evaluation   Cervical / Trunk Assessment Cervical / Trunk Assessment: Normal   Communication Communication Communication: No difficulties   Cognition Arousal/Alertness: Awake/alert Behavior During Therapy: Agitated, Anxious, Impulsive, Restless Overall Cognitive Status: Impaired/Different from baseline Area of Impairment: Orientation, Memory, Following commands, Safety/judgement, Awareness, Problem solving                 Orientation Level: Disoriented to, Place, Time, Situation   Memory: Decreased short-term memory Following Commands: Follows one step commands inconsistently Safety/Judgement: Decreased awareness of safety, Decreased awareness of deficits Awareness: Intellectual Problem Solving: Requires verbal cues, Requires tactile cues General Comments: Pt is very paranoid. Asking therapist multiple times "Do you have a knife? Are you going to kill me" each time reassured that we are helping, that she is safe, and we want to keep her safe. She is impulsive and pulls at lines - see general comments section. Pt is semi-cooperative. More success when distracting her with talk about horses and horseback riding.     General Comments  Pt standing EOB with therapy team and due to paranoia/AMS would not move to chair or up the bed or walk in the room. Despite 2 people, Pt suddenly reached out with Left hand and pulled  on, breaking an IV line. The PT was able to maintain IV access integrity and IV line was broken. RN and NP came in room immediately after and assisted with returning the patient supine, and getting restraints back on patient, and speaking with Pt's husband [name]. During standing Pt HR elevated up to 139, SpO2 >90% on 2L O2. No BP was able to be taken at that time.    Exercises     Shoulder Instructions      Home Living Family/patient expects to be discharged to:: Private residence Living Arrangements: Spouse/significant other Available Help at Discharge: Family;Available 24 hours/day Type of Home: House Home Access: Stairs to enter [employer] of Steps: 1   Home Layout: One level     Bathroom Shower/Tub: Occupational psychologist: Standard     Home Equipment: Conservation officer, nature (2 wheels);Kasandra Knudsen - single point   Additional Comments: husband [name]) present, providing PLOF and home set-up info      Prior Functioning/Environment Prior Level of Function : Independent/Modified Independent;Driving             Mobility Comments: Independent without DME; drives; retired Engineer, water. Enjoys reading; used to ride horses and play tennis ADLs Comments: Independent        OT Problem List: Decreased activity tolerance;Decreased cognition;Decreased safety awareness;Cardiopulmonary status limiting activity      OT Treatment/Interventions: Self-care/ADL training;DME and/or AE instruction;Therapeutic activities;Cognitive remediation/compensation;Patient/family education;Balance training    OT  Goals(Current goals can be found in the care plan section) Acute Rehab OT Goals Patient Stated Goal: none Time For Goal Achievement: 01/21/22 Potential to Achieve Goals: Good ADL Goals Pt Will Perform Grooming: with supervision;standing Pt Will Perform Upper Body Dressing: with supervision;sitting Pt Will Perform Lower Body Dressing: with supervision;sit to/from stand Pt Will  Transfer to Toilet: with supervision;ambulating Pt Will Perform Toileting - Clothing Manipulation and hygiene: with supervision;sit to/from stand Additional ADL Goal #1: Pt will follow multistep directions with 75% accuracy  OT Frequency: Min 2X/week    Co-evaluation PT/OT/SLP Co-Evaluation/Treatment: Yes Reason for Co-Treatment: Necessary to address cognition/behavior during functional activity;For patient/therapist safety PT goals addressed during session: Mobility/safety with mobility;Balance;Strengthening/ROM OT goals addressed during session: ADL's and self-care;Strengthening/ROM;Other (comment) (cognition)      AM-PAC OT "6 Clicks" Daily Activity     Outcome Measure Help from another person eating meals?: Total Help from another person taking care of personal grooming?: Total Help from another person toileting, which includes using toliet, bedpan, or urinal?: Total Help from another person bathing (including washing, rinsing, drying)?: Total Help from another person to put on and taking off regular upper body clothing?: Total Help from another person to put on and taking off regular lower body clothing?: Total 6 Click Score: 6   End of Session Equipment Utilized During Treatment: Oxygen (2L) Nurse Communication: Mobility status;Precautions  Activity Tolerance: Treatment limited secondary to agitation Patient left: in bed;with call bell/phone within reach;with bed alarm set;with nursing/sitter in room;with restraints reapplied;with family/visitor present  OT Visit Diagnosis: Other abnormalities of gait and mobility (R26.89);Other symptoms and signs involving cognitive function                Time: 7681-1572 OT Time Calculation (min): 32 min Charges:  OT General Charges $OT Visit: 1 Visit OT Evaluation $OT Eval Moderate Complexity: [place] OTR/L Acute Rehabilitation Services Office: [place] 01/07/2022, 1:33 PM

## 2022-01-07 NOTE — Progress Notes (Signed)
Continuing to be agitated and having hallucinations. States, "I am [name], anyone can see that I am a female. Im dead already, you are kidding me."  Rambling speech and talking to self and answering own questions .  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 She is talking to someone who is not in the room, and she states, the nurse is in the room and she is the bad guy. She states,"Dr. Michel Bickers get out of this because they will get you and you dont want to get your family into this." She then says, "I think he is a rapper."   Yells out, "help me, they are on both sides of me."  Flight of ideas noted and nonsensical speech and sentences. Continues to try to pull at anything that she can reach intermittently.  Continuing to monitor.

## 2022-01-07 NOTE — Progress Notes (Addendum)
ANTICOAGULATION CONSULT NOTE   Pharmacy Consult for Heparin Indication: atrial fibrillation  Allergies  Allergen Reactions   Chlorhexidine Gluconate Hives, Swelling and Rash       "ChloraPrep "   Doxycycline Nausea And Vomiting   Codeine Nausea Only and Other (See Comments)    Pt can tolerate when pre-medicated   Doxycycline Nausea And Vomiting   Nsaids     avoid due to gerd   Nsaids Other (See Comments)    Gi bleed   Tylenol [Acetaminophen]     Avoid due to GERD   Adhesive [Tape] Rash    Dermabond   Chlorhexidine Swelling and Rash   Codeine Nausea Only   Tylenol [Acetaminophen] Other (See Comments)    GI bleed    Patient Measurements: Height: 5' (152.4 cm) Weight: 66.7 kg (147 lb 0.8 oz) IBW/kg (Calculated) : 45.5 Heparin Dosing Weight: 60 kg   Vital Signs: Temp: 99.1 F (37.3 C) (08/03 0730) Temp Source: Bladder (08/03 0730) BP: 94/59 (08/03 0700) Pulse Rate: 78 (08/03 0730)  Labs: Recent Labs    01/05/22 0105 01/05/22 0502 01/05/22 1030 01/06/22 0432 01/06/22 0447 01/06/22 1132 01/06/22 2205 01/07/22 0408  HGB 11.1*  --   --  11.2* 11.1*  --   --  11.1*  HCT 31.1*  --   --  33.0* 32.4*  --   --  31.2*  PLT 140*  --   --   --  156  --   --  132*  APTT  --   --    < >  --  34 58* 65* 95*  HEPARINUNFRC  --   --    < >  --  1.05* 0.89*  --  0.80*  CREATININE 0.66  --   --   --  0.71  --   --  0.66  TROPONINIHS  --  55*  --   --   --   --   --   --    < > = values in this interval not displayed.     Estimated Creatinine Clearance: 49.4 mL/min (by C-G formula based on SCr of 0.66 mg/dL).   Medical History: Past Medical History:  Diagnosis Date   Anemia    history of   Anxiety    Arthritis    Asthma    Breast cancer ([facility])    Cancer ([place])    skin cancers, one melanoma    Complication of anesthesia    patient woke up during colonoscopy   Concussion    8 yrs. ago due to being thrown from a horse   Dislocated elbow 10/12/2016   left   Family  history of breast cancer    GERD (gastroesophageal reflux disease)    History of kidney stones    History of radiation therapy 03/23/17-05/04/17   right chest wall 50.4 Gy in 28 fractions, right axilla 45 Gy in 25 fractions   Hypertension    Hypertensive heart disease without CHF    Keratosis, actinic    Melanoma ([facility])    Persistent atrial fibrillation ([facility]) 12/16/2016   CHA2DS2VASC score 3   Pneumonia    hx of     Medications:  Scheduled:   arformoterol  15 mcg Nebulization BID   budesonide (PULMICORT) nebulizer solution  0.25 mg Nebulization BID   insulin aspart  0-9 Units Subcutaneous Q4H   methylPREDNISolone (SOLU-MEDROL) injection  40 mg Intravenous Q24H   metoprolol tartrate  25 mg Oral BID  midazolam  2 mg Intravenous Once   mouth rinse  15 mL Mouth Rinse 4 times per day   pantoprazole (PROTONIX) IV  40 mg Intravenous QHS   polyethylene glycol  17 g Per Tube Daily   revefenacin  175 mcg Nebulization Daily    Assessment: 7 yof presenting with acute hypoxemia related to COPD excerbation/PNA complicated by PEA arrest  - on Xarelto PTA for Afib (LD 7/29).   Heparin level still remains elevated at 0.8, aPTT is  therapeutic at 95, on 900 units/hr. No s/sx of bleeding or infusion issues.  Given recent DOAC will monitor both aPTT and heparin level until correlate.   Goal of Therapy:  Heparin level 0.3-0.7 units/ml aPTT 66-102 seconds Monitor platelets by anticoagulation protocol: Yes   Plan:  Continue heparin to 900 units/hr Heparin level and aPTT in 6 hr Monitor CBC, and for s/sx of bleeding   Antonietta Jewel, PharmD, [place] Pharmacist  Phone: [phone] 01/07/2022 8:02 AM  Please check AMION for all [place] phone numbers After 10:00 PM, call [place] [phone]   ADDENDUM aPTT is therapeutic at 95, on heparin infusion at 900 units/hr. Hgb 11.1, plt 132. No s/sx of bleeding or infusion issues. Drawn correctly. Continue heparin infusion at 900  units/hr and get levels with AM labs.   Antonietta Jewel, PharmD, BCCCP Clinical Pharmacist  Phone: [phone] 01/07/2022 1:44 PM  Please check AMION for all [facility] phone numbers After 10:00 PM, call [place] [phone]

## 2022-01-07 NOTE — Progress Notes (Signed)
Has been awake all night and unable to rest despite being on Precedex gtt/ see EMAR. Agitation and hallucinations continue w/  being focused on someone trying to harm her. Continuing to attempt to redirect and provide calming environment .  HR has been better controlled while on Precedex. HR now 100-115 afibb. Bp 99/73 map 83.

## 2022-01-07 NOTE — Progress Notes (Addendum)
NAME:  [name], MRN:  [id number], DOB:  [birth year], LOS: 6 ADMISSION DATE:  12/21/2021, CONSULTATION DATE:  01/03/22 REFERRING MD:  Graciella Freer, CHIEF COMPLAINT:  cardiac arrest   History of Present Illness:  81 year old woman w/ hx of Afib on xarelto who presented with R sided pleurisy, cough, fevers.  Found to have R sided pneumonia, new hypoxemia, afib/RVR, and admitted to progressive.  Also tx empirically for possible COPD exacerbation.  Today worsening SOB then found pulseless.  7 min CPR, PEA.  Some purposeful movement after arrest.  PCCM consulted.  Pertinent  Medical History  Breast ca on letrozole Afib on [place] [facility] Events: Including procedures, antibiotic start and stop dates in addition to other pertinent events   7/28 admit, Ceftriaxone/Azithro 7/29 last dose xeralto  7/30 IHCA x 7 min, Ceftriaxone>Cefepime 7/31 Chest tube placed, heparin gtt started 8/1 patient remove chest tube.  Head CT obtained >negative.  Severely agitated overnight.  Vancomycin stopped.  Azithromycin stopped.  Interim History / Subjective:  Acutely agitated overnight.  Started on Precedex.  Afib with RVR> given IV metop  Significantly confused on exam  0.6 precedex  Tmax 100.8  -1.8 L admission, -1.7 L past 24 hours, 2.4 L urine output  Objective   Blood pressure (!) 94/59, pulse 78, temperature 99.1 F (37.3 C), temperature source Bladder, resp. rate 20, height 5' (1.524 m), weight 66.7 kg, SpO2 97 %. CVP:  [5 mmHg-23 mmHg] 9 mmHg  Vent Mode: PRVC FiO2 (%):  [36 %-40 %] 36 % Set Rate:  [14 bmp] 14 bmp Vt Set:  [360 mL] 360 mL PEEP:  [5 cmH20] 5 cmH20 Plateau Pressure:  [22 cmH20] 22 cmH20   Intake/Output Summary (Last 24 hours) at 01/07/2022 0934 Last data filed at 01/07/2022 0730 Gross per 24 hour  Intake 757.68 ml  Output 2490 ml  Net -1732.32 ml   Filed Weights   01/05/22 0446 01/06/22 0500 01/07/22 0404  Weight: 66.8 kg 69.7 kg 66.7 kg     Examination: General: In bed, NAD, appears comfortable HEENT: MM pink/moist, anicteric, atraumatic Neuro: RASS +1, PERRL 58m, inappropriate-confused, follows commands, MAE, CV: S1S2, Afib, no m/r/g appreciated PULM:  air movement in all lobes, trachea midline, chest expansion symmetric GI: soft, bsx4 active, non-tender   Extremities: warm/dry, no pretibial edema, capillary refill less than 3 seconds  Skin:  no rashes or lesions noted  Labs/imaging: Pleural fluid no growth 3 days Blood cultures no growth day 5 final Respiratory culture no growth final Blood glucose 98-143 WBC 17.6 > 12.3 Platelets 156 > 132 Hemoglobin 11.1 > 11.1 Sodium 132 > 134 Potassium 3.4   Resolved Hospital Problem list   N/A  Assessment & Plan:  In hospital cardiac arrest, PEA arrest, in the setting of hypoxemia and hyperkalemia. 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  Basal LV segments demonstrate normal contractility.  Findings most consistent with Takotsubo cardiomyopathy.  Moderate mitral valve regurgitation.  Mild to moderate tricuspid valve regurgitation. Troponin 131 > 55.  CVP 8 on 8/3 -Goal MAP 65.  Off Levophed. -Appreciate cardiology assistance, continue IV metoprolol 2.5 mg every 6 hours for atrial fibrillation -Continue heparin drip.  Swallow eval pending today.  Hoping to transition back to Xarelto pending swallow eval - As needed EKG -Goal potassium greater than 4, goal mag greater than 2.  Potassium repleted -Repeat '20mg'$  IV  lasix 8/3  Right lower lobe pneumonia Acute respiratory failure with hypoxia, secondary to pneumonia- improving Right pleural effusion, status post chest tube placement on 7/31-? Exudative LDH pleural fluid 278, Protein pleural fluid less than 3, Serum Total protein 5.6, LDH 161.  History of breast cancer. WBC 20.8 > 18.8 >  13.7>17.6>12.3.  On steroids. On 3-4 L [place] -Aggressive pulmonary toilet. OOB as able. IS. Start bed chest PT. -Goal SPO2 90-96%. Wean O2 to goal. -Continue Pulmicort Brovana and Yupelri, PRN albuterol -Weaning steroids- on Solu-Medrol 40 mg, last dose today 8/3 -Continue cefepime. Stop date 8/4  Hyponatremia Na 128>129 >130>131>134 -Monitor labs -No free water  Post Arrest Encephalopathy vs ICU delirium Head CT negative.  EEG with no epileptiform discharges seen.  Neuro consulted did not feel strongly that patient was having seizures.  Patient with inappropriate speech.  RASS +1 to +2.  CAM-ICU positive. -On Precedex at 0.6.  Starting sublingual Zyprexa 2.5 mg one-time dose.  We will attempt to wean off of Precedex and find p.o. regimen for agitation. -Delirium precautions.  Out of bed as able.  Frequent reorientation.  Lights on during the day.  PT OT evaluation pending.  Attempt to liberalize from restraints.  Hypokalemia Hypomagnesemia-resolved Hypophosphatemia-resolved -Replete -Follow-up on a.m. labs  Nutrition -follow up on swallow evaluation  Hyperglycemia Blood glucose 98-143 -Blood Glucose goal 140-180. -SSI   Best Practice (right click and "Reselect all SmartList Selections" daily)   Diet/type: NPO w/ oral meds DVT prophylaxis: systemic heparin GI prophylaxis: PPI Lines: Central line and yes and it is still needed Foley:  Yes, and it is no longer needed and removal ordered  Code Status:  full code Last date of multidisciplinary goals of care discussion [husband updated at bedside on 8/2]   Critical care time: 35 minutes    Redmond School., MSN, APRN, AGACNP-BC [facility]  01/07/2022 , 9:34 AM  Please see [email or web address] for pager details  If no response, please call [phone] After hours, please call Elink at [phone]

## 2022-01-07 NOTE — Evaluation (Signed)
Physical Therapy Evaluation Patient Details Name: [name] MRN: [id number] DOB: 1940-06-20 Today's Date: 01/07/2022  History of Present Illness  Pt is a 81 y.o. female admitted 12/05/2021 with SOB, productive cough, R-side chest pain. Workup for sepsis secondary to CAP. Pt confused 7/30, head CT negative for acute abnormality. Code blue 7/30 due to episode of unresponsiveness; ROSC achieved after 7 min CPR, pt intubated. Chest tube insertion 7/31; pt removed 8/1. ETT 7/30-8/2. Continued AMS, hallucinations overnight 8/2; head CT 8/2 remains negative for acute abnormality. EEG 8/2 with no seizure activity. PMH includes afib, HTN, breast CA, melanoma, asthma, bilateral TKA, anxiety.   Clinical Impression  Pt presents with an overall decrease in functional mobility secondary to above. Per husband, PTA, pt independent, retired Engineer, water, used to horseback ride and play tennis, now enjoys reading. Today, pt only oriented to herself and husband, paranoid throughout session and difficult to redirect, though pt stating she wants to move and intermittently cooperative with tasks/conversation. Pt demonstrates good strength, able to stand with min guard, but ultimately unable to redirect pt enough to progress ambulation. Pt pulling IV line from box, spilling Precedex on floor, but fortunately able to keep central secure to pt; RN and NP present to assist. Pt's husband present and supportive. Based on current cognitive status, recommend SNF-level therapies to maximize functional mobility and independence prior to return home. Will follow acutely to address established goals.   Recommendations for follow up therapy are one component of a multi-disciplinary discharge planning process, led by the attending physician.  Recommendations may be updated based on patient status, additional functional criteria and insurance authorization.  Follow Up Recommendations Skilled nursing-short term rehab (<3 hours/day) Can  patient physically be transported by private vehicle: No (due to AMS)    Assistance Recommended at Discharge Frequent or constant Supervision/Assistance  Patient can return home with the following  A lot of help with walking and/or transfers;A lot of help with bathing/dressing/bathroom;Assistance with cooking/housework;Direct supervision/assist for medications management;Direct supervision/assist for financial management;Assist for transportation;Help with stairs or ramp for entrance    Equipment Recommendations  (TBD)  Recommendations for Other Services       Functional Status Assessment Patient has had a recent decline in their functional status and demonstrates the ability to make significant improvements in function in a reasonable and predictable amount of time.     Precautions / Restrictions Precautions Precautions: Fall;Other (comment) Precaution Comments: agitated, pulling at lines Restrictions Weight Bearing Restrictions: No      Mobility  Bed Mobility Overal bed mobility: Needs Assistance Bed Mobility: Supine to Sit, Sit to Supine     Supine to sit: Min assist, HOB elevated Sit to supine: Total assist, +2 for physical assistance, +2 for safety/equipment   General bed mobility comments: pt initiating movement to EOB well, minA for HHA to pull trunk to sitting, pt easily distracted and paranoid requiring frequent redirection to complete task; totalA+2 for return to supine as pt did not want to lay back down despite multiple attempts/encouragement, maxA to keep pt from pulling at lines    Transfers Overall transfer level: Needs assistance Equipment used: 2 person hand held assist Transfers: Sit to/from Stand Sit to Stand: Min guard, +2 safety/equipment, From elevated surface           General transfer comment: multiple sit<>stands at min guard +2 safety and line management, side steps towards HOB with max cues; paranoia/AMS more debilitating than physical  capabilities    Ambulation/Gait  General Gait Details: unable to progress secondary to AMS, pt pulling at lines  Stairs            Wheelchair Mobility    Modified Rankin (Stroke Patients Only)       Balance Overall balance assessment: Mild deficits observed, not formally tested   Sitting balance-Leahy Scale: Fair       Standing balance-Leahy Scale: Poor Standing balance comment: bracing LEs against EOB or HHA                             Pertinent Vitals/Pain Pain Assessment Pain Assessment: Faces Faces Pain Scale: No hurt Pain Intervention(s): Monitored during session    Home Living Family/patient expects to be discharged to:: Private residence Living Arrangements: Spouse/significant other Available Help at Discharge: Family;Available 24 hours/day Type of Home: House Home Access: Stairs to enter   [employer] of Steps: 1   Home Layout: One level Home Equipment: Conservation officer, nature (2 wheels);Kasandra Knudsen - single point Additional Comments: husband [name]) present, providing PLOF and home set-up info    Prior Function Prior Level of Function : Independent/Modified Independent;Driving             Mobility Comments: Independent without DME; drives; retired Engineer, water. Enjoys reading; used to ride horses and play tennis ADLs Comments: Independent     Hand Dominance   Dominant Hand: Right    Extremity/Trunk Assessment   Upper Extremity Assessment Upper Extremity Assessment: Overall WFL for tasks assessed    Lower Extremity Assessment Lower Extremity Assessment: Generalized weakness;Difficult to assess due to impaired cognition (functional observation >3/5 strength)    Cervical / Trunk Assessment Cervical / Trunk Assessment: Normal  Communication   Communication: No difficulties  Cognition Arousal/Alertness: Awake/alert Behavior During Therapy: Agitated, Anxious, Impulsive, Restless Overall Cognitive Status:  Impaired/Different from baseline Area of Impairment: Orientation, Memory, Following commands, Safety/judgement, Awareness, Problem solving                 Orientation Level: Disoriented to, Place, Time, Situation   Memory: Decreased short-term memory Following Commands: Follows one step commands inconsistently Safety/Judgement: Decreased awareness of safety, Decreased awareness of deficits Awareness: Intellectual Problem Solving: Requires verbal cues, Requires tactile cues General Comments: Pt is very paranoid. Asking therapist multiple times "Do you have a knife? Are you going to kill me" each time reassured that we are helping, that she is safe, and we want to keep her safe. She is impulsive and pulls at lines - see general comments section. Pt is semi-cooperative. More success when distracting her with talk about horses and horseback riding.        General Comments General comments (skin integrity, edema, etc.): pt standing at EOB with PT/OT but would not sit down or move away from standing EOB despite max cues with assist+2; pt disconnecting Precedex IV line from IV box causing medicine to spill on floor, requiring maxA to keep pt from pulling out central line (which was not dislodged). RN and NP present to assist with return to supine and reapplying wrist restraints. pt's husband [name]) present and supportive, attempting to help calm and redirect pt. HR up to 139 with activity    Exercises     Assessment/Plan    PT Assessment Patient needs continued PT services  PT Problem List Decreased activity tolerance;Decreased balance;Decreased mobility;Decreased cognition;Decreased knowledge of precautions;Decreased safety awareness       PT Treatment Interventions DME instruction;Gait training;Stair training;Functional mobility training;Therapeutic activities;Therapeutic exercise;Balance  training;Patient/family education;Cognitive remediation    PT Goals (Current goals can be found in  the Care Plan section)  Acute Rehab PT Goals Patient Stated Goal: none stated PT Goal Formulation: With patient Time For Goal Achievement: 01/21/22 Potential to Achieve Goals: Fair    Frequency Min 3X/week     Co-evaluation PT/OT/SLP Co-Evaluation/Treatment: Yes Reason for Co-Treatment: Necessary to address cognition/behavior during functional activity;For patient/therapist safety PT goals addressed during session: Mobility/safety with mobility;Balance OT goals addressed during session: ADL's and self-care;Strengthening/ROM;Other (comment) (cognition)       AM-PAC PT "6 Clicks" Mobility  Outcome Measure Help needed turning from your back to your side while in a flat bed without using bedrails?: A Lot Help needed moving from lying on your back to sitting on the side of a flat bed without using bedrails?: A Lot Help needed moving to and from a bed to a chair (including a wheelchair)?: A Lot Help needed standing up from a chair using your arms (e.g., wheelchair or bedside chair)?: A Lot Help needed to walk in hospital room?: Total Help needed climbing 3-5 steps with a railing? : Total 6 Click Score: 10    End of Session Equipment Utilized During Treatment: Oxygen Activity Tolerance: Treatment limited secondary to agitation Patient left: in bed;with call bell/phone within reach;with nursing/sitter in room;with family/visitor present Nurse Communication: Mobility status PT Visit Diagnosis: Other abnormalities of gait and mobility (R26.89);Muscle weakness (generalized) (M62.81)    Time: 1127-1200 PT Time Calculation (min) (ACUTE ONLY): 33 min   Charges:   PT Evaluation $PT Eval Moderate Complexity: [place], PT, DPT Acute Rehabilitation Services  Personal: [place] [facility] Office: [place] 01/07/2022, 4:07 PM

## 2022-01-07 NOTE — Evaluation (Signed)
Clinical/Bedside Swallow Evaluation Patient Details  Name: JAALA BOHLE MRN: [id number] Date of Birth: 06-16-1940  Today's Date: 01/07/2022 Time: SLP Start Time (ACUTE ONLY): 1010 SLP Stop Time (ACUTE ONLY): 1025 SLP Time Calculation (min) (ACUTE ONLY): 15 min  Past Medical History:  Past Medical History:  Diagnosis Date   Anemia    history of   Anxiety    Arthritis    Asthma    Breast cancer ([place])    Cancer ([place])    skin cancers, one melanoma    Complication of anesthesia    patient woke up during colonoscopy   Concussion    8 yrs. 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    Keratosis, actinic    Melanoma ([place])    Persistent atrial fibrillation ([place]) 12/16/2016   CHA2DS2VASC score 3   Pneumonia    hx of    Past Surgical History:  Past Surgical History:  Procedure Laterality Date   brachial skin removal due to deforimity Bilateral    BREAST ENHANCEMENT SURGERY Left 11/14/2018   Procedure: LEFT BREAST AUGMENTATION;  Surgeon: [name], MD;  Location: [facility];  Service: Plastics;  Laterality: Left;   BREAST RECONSTRUCTION Right 06/26/2018   BREAST RECONSTRUCTION WITH PLACEMENT OF TISSUE EXPANDER AND ALLODERM Right 06/26/2018   Procedure: RIGHT BREAST RECONSTRUCTION WITH PLACEMENT OF TISSUE EXPANDER;  Surgeon: [name], MD;  Location: [facility];  Service: Plastics;  Laterality: Right;   BREAST RECONSTRUCTION WITH PLACEMENT OF TISSUE EXPANDER AND FLEX HD (ACELLULAR HYDRATED DERMIS) Right 01/28/2017    RIGHT BREAST RECONSTRUCTION WITH PLACEMENT OF TISSUE EXPANDER AND ALLODERM;  Surgeon: [name], MD;  Location: [facility];   Service: Plastics;  Laterality: Right;   BUNIONECTOMY     left    CATARACT EXTRACTION     [place] cataract surgery    COLONOSCOPY W/ POLYPECTOMY     JOINT REPLACEMENT     KNEE ARTHROSCOPY     left    LATISSIMUS FLAP TO BREAST Right 06/26/2018   Procedure: LATISSIMUS FLAP TO RIGHT CHEST;  Surgeon: [name], MD;  Location: [facility];  Service: Plastics;  Laterality: Right;   MASTECTOMY Right    MASTECTOMY W/ SENTINEL NODE BIOPSY Right 01/28/2017   Procedure: RIGHT TOTAL MASTECTOMY WITH SENTINEL LYMPH NODE BIOPSY;  Surgeon: Excell Seltzer, MD;  Location: [facility];  Service: General;  Laterality: Right;   MASTOPEXY Left 11/14/2018   Procedure: LEFT BREAST MASTOPEXY;  Surgeon: [name], MD;  Location: [facility];  Service: Plastics;  Laterality: Left;   MELANOMA EXCISION Left    polyp removed from uterus      REMOVAL OF TISSUE EXPANDER AND PLACEMENT OF IMPLANT Right 11/14/2018   Procedure: REMOVAL OF RIGHT TISSUE EXPANDER AND PLACEMENT OF SALINE IMPLANT;  Surgeon: [name], MD;  Location: [facility];  Service: Plastics;  Laterality: Right;   right wrist pinning      skin cancers removed     TISSUE EXPANDER PLACEMENT Right 02/27/2018   Procedure: REMOVAL OF TISSUE EXPANDER;  Surgeon: [name], MD;  Location: [facility];  Service:  Plastics;  Laterality: Right;   TOTAL KNEE ARTHROPLASTY Right 01/29/2013   Procedure: RIGHT TOTAL KNEE ARTHROPLASTY;  Surgeon: Gearlean Alf, MD;  Location: [facility];  Service: Orthopedics;  Laterality: Right;   TOTAL KNEE ARTHROPLASTY Left 08/29/2017   Procedure: LEFT TOTAL KNEE ARTHROPLASTY;  Surgeon: Gaynelle Arabian, MD;  Location: [facility];  Service: Orthopedics;  Laterality: Left;   HPI:  Patient is a 81 y.o. female with PMH: afib on Xaralto, asthma, GERD, essential HTN, chronic diastolic CHF, right breast cancer, h/o syncope, generalized anxiety disorder, moderate pulmonary  hypertension. She presented to [facility] on  12/13/2021 with SOB, productive cough and right sided chest pain for three days prior to admission. On EMS arrival, patient was hypoxic on RA at 85% and was placed on NRB. In ED she had a Tmax of 103. CXR showed right-sided PNA. On 7/30, rapid respose was called due to patient found unresponsive in hospital bed. CPR started and ROSC achieved after 7 minutes. Patient was intubated by CRNA and transferred to [facility]. On 8/2 she was extubated to supplemental oxygen via [place]    Assessment / Plan / Recommendation  Clinical Impression  Patient is presenting with a cognitive-based dysphagia at this time but cannot r/o post-extubation dysphagia as patient did not initiate a swallow with limited amount of liquids attempted. After period of time attempting to build rapport with patient, she was willing to take some sips of water. She actively took in water via straw sips and seemed to be about to swallow when she suddenly spit out a stream of water onto blanket that was covering her. SLP offered water one more time and patient swished in mouth and proceeded to spit water out again. SLP will follow patient for readiness to trial PO's. Of note, she was achieving adequate, clear voicing s/p 4 day intubation. SLP Visit Diagnosis: Dysphagia, unspecified (R13.10)    Aspiration Risk  Mild aspiration risk;Moderate aspiration risk;Risk for inadequate nutrition/hydration    Diet Recommendation NPO   Medication Administration: Via alternative means    Other  Recommendations Oral Care Recommendations: Oral care QID;Other (Comment) (oral care as patient tolerates/allows)    Recommendations for follow up therapy are one component of a multi-disciplinary discharge planning process, led by the attending physician.  Recommendations may be updated based on patient status, additional functional criteria and insurance authorization.  Follow up Recommendations Other (comment) (TBD)      Assistance Recommended at Discharge Frequent or  constant Supervision/Assistance  Functional Status Assessment Patient has had a recent decline in their functional status and demonstrates the ability to make significant improvements in function in a reasonable and predictable amount of time.  Frequency and Duration min 2x/week  1 week       Prognosis Prognosis for Safe Diet Advancement: Good Barriers to Reach Goals: Cognitive deficits      Swallow Study   General Date of Onset: 12/19/2021 HPI: Patient is a 81 y.o. female with PMH: afib on Xaralto, asthma, GERD, essential HTN, chronic diastolic CHF, right breast cancer, h/o syncope, generalized anxiety disorder, moderate pulmonary  hypertension. She presented to [facility] on 01/03/2022 with SOB, productive cough and right sided chest pain for three days prior to admission. On EMS arrival, patient was hypoxic on RA at 85% and was placed on NRB. In ED she had a Tmax of 103. CXR showed right-sided PNA. On 7/30, rapid respose was called due to patient found unresponsive in hospital bed. CPR started and ROSC achieved after 7 minutes. Patient  was intubated by CRNA and transferred to [facility]. On 8/2 she was extubated to supplemental oxygen via [place] Type of Study: Bedside Swallow Evaluation Previous Swallow Assessment: none found Diet Prior to this Study: NPO Temperature Spikes Noted: No Respiratory Status: Nasal cannula History of Recent Intubation: Yes Length of Intubations (days): 4 days Date extubated: 01/06/22 Behavior/Cognition: Alert;Doesn't follow directions;Requires cueing;Uncooperative;Confused Oral Cavity Assessment: Other (comment) (not attempted secondary to patient confused and highly suspicious of SLP) Oral Care Completed by SLP: No Oral Cavity - Dentition: Adequate natural dentition Self-Feeding Abilities: Needs assist;Needs set up Patient Positioning: Upright in bed Baseline Vocal Quality: Low vocal intensity;Normal Volitional Cough: Cognitively unable to elicit Volitional Swallow: Unable to  elicit    Oral/Motor/Sensory Function Overall Oral Motor/Sensory Function: Within functional limits   Ice Chips     Thin Liquid Thin Liquid: Impaired Oral Phase Impairments: Poor awareness of bolus;Other (comment) (patient spit out stream of water onto self x2) Pharyngeal  Phase Impairments: Other (comments) Other Comments: no swallow initiation observed    Nectar Thick     Honey Thick     Puree Puree: Not tested   Solid     Solid: Not tested      Sonia Baller, MA, CCC-SLP Speech Therapy

## 2022-01-07 NOTE — Progress Notes (Signed)
[facility] Patient Name: [name] DOB: Sep 30, 1940 MRN: [id number]   Date of Service  01/07/2022  HPI/Events of Note  Agitated delirium with hallucinations, she is also very tachycardic, no known history of ETOH abuse. She reportedly has a history of severe anxiety.  eICU Interventions  Will trial low level Precedex with the aim of impacting anxiety, delirium, and tachycardia simultaneously.        Kerry Kass [name] 01/07/2022, 12:28 AM

## 2022-01-08 ENCOUNTER — Inpatient Hospital Stay (HOSPITAL_COMMUNITY): Payer: Medicare Other

## 2022-01-08 DIAGNOSIS — J9601 Acute respiratory failure with hypoxia: Secondary | ICD-10-CM | POA: Diagnosis not present

## 2022-01-08 DIAGNOSIS — J189 Pneumonia, unspecified organism: Secondary | ICD-10-CM | POA: Diagnosis not present

## 2022-01-08 DIAGNOSIS — I469 Cardiac arrest, cause unspecified: Secondary | ICD-10-CM | POA: Diagnosis not present

## 2022-01-08 DIAGNOSIS — I482 Chronic atrial fibrillation, unspecified: Secondary | ICD-10-CM | POA: Diagnosis not present

## 2022-01-08 LAB — GLUCOSE, CAPILLARY
Glucose-Capillary: 100 mg/dL — ABNORMAL HIGH (ref 70–99)
Glucose-Capillary: 113 mg/dL — ABNORMAL HIGH (ref 70–99)
Glucose-Capillary: 117 mg/dL — ABNORMAL HIGH (ref 70–99)
Glucose-Capillary: 120 mg/dL — ABNORMAL HIGH (ref 70–99)
Glucose-Capillary: 163 mg/dL — ABNORMAL HIGH (ref 70–99)
Glucose-Capillary: 74 mg/dL (ref 70–99)
Glucose-Capillary: 99 mg/dL (ref 70–99)

## 2022-01-08 LAB — BASIC METABOLIC PANEL
Anion gap: 8 (ref 5–15)
BUN: 26 mg/dL — ABNORMAL HIGH (ref 8–23)
CO2: 27 mmol/L (ref 22–32)
Calcium: 9.2 mg/dL (ref 8.9–10.3)
Chloride: 101 mmol/L (ref 98–111)
Creatinine, Ser: 0.65 mg/dL (ref 0.44–1.00)
GFR, Estimated: >60 mL/min (ref >60)
Glucose, Bld: 137 mg/dL — ABNORMAL HIGH (ref 70–99)
Potassium: 3.7 mmol/L (ref 3.5–5.1)
Sodium: 136 mmol/L (ref 135–145)

## 2022-01-08 LAB — CBC
HCT: 32.1 % — ABNORMAL LOW (ref 36.0–46.0)
Hemoglobin: 11.1 g/dL — ABNORMAL LOW (ref 12.0–15.0)
MCH: 36.4 pg — ABNORMAL HIGH (ref 26.0–34.0)
MCHC: 34.6 g/dL (ref 30.0–36.0)
MCV: 105.2 fL — ABNORMAL HIGH (ref 80.0–100.0)
Platelets: 171 10*3/uL (ref 150–400)
RBC: 3.05 MIL/uL — ABNORMAL LOW (ref 3.87–5.11)
RDW: 13.3 % (ref 11.5–15.5)
WBC: 16.8 10*3/uL — ABNORMAL HIGH (ref 4.0–10.5)
nRBC: 0 % (ref 0.0–0.2)

## 2022-01-08 LAB — APTT: aPTT: 53 seconds — ABNORMAL HIGH (ref 24–36)

## 2022-01-08 LAB — BODY FLUID CULTURE W GRAM STAIN
Culture: NO GROWTH
Gram Stain: NONE SEEN

## 2022-01-08 LAB — MAGNESIUM: Magnesium: 2.2 mg/dL (ref 1.7–2.4)

## 2022-01-08 LAB — HEPARIN LEVEL (UNFRACTIONATED): Heparin Unfractionated: 0.59 IU/mL (ref 0.30–0.70)

## 2022-01-08 MED ORDER — RIVAROXABAN 20 MG PO TABS
20.0000 mg | ORAL_TABLET | Freq: Every day | ORAL | Status: DC
Start: 2022-01-08 — End: 2022-01-10
  Administered 2022-01-08 – 2022-01-09 (×2): 20 mg
  Filled 2022-01-08 (×4): qty 1

## 2022-01-08 MED ORDER — POLYETHYLENE GLYCOL 3350 17 G PO PACK
17.0000 g | PACK | Freq: Every day | ORAL | Status: DC
  Administered 2022-01-09: 17 g
  Filled 2022-01-08: qty 1

## 2022-01-08 MED ORDER — POLYETHYLENE GLYCOL 3350 17 G PO PACK: 17.0000 g | PACK | Freq: Every day | ORAL | Status: DC | PRN

## 2022-01-08 MED ORDER — QUETIAPINE FUMARATE 50 MG PO TABS
25.0000 mg | ORAL_TABLET | Freq: Two times a day (BID) | ORAL | Status: DC
Start: 2022-01-08 — End: 2022-01-10
  Administered 2022-01-08 – 2022-01-10 (×4): 25 mg
  Filled 2022-01-08 (×4): qty 1

## 2022-01-08 MED ORDER — CLONAZEPAM 1 MG PO TABS
1.0000 mg | ORAL_TABLET | Freq: Once | ORAL | Status: AC
  Administered 2022-01-08: 1 mg via NASOGASTRIC
  Filled 2022-01-08: qty 1

## 2022-01-08 MED ORDER — METOPROLOL TARTRATE 25 MG PO TABS
25.0000 mg | ORAL_TABLET | Freq: Two times a day (BID) | ORAL | Status: DC
  Administered 2022-01-08: 25 mg
  Filled 2022-01-08: qty 1

## 2022-01-08 MED ORDER — MELATONIN 5 MG PO TABS: 5.0000 mg | ORAL_TABLET | Freq: Every evening | ORAL | Status: DC | PRN

## 2022-01-08 MED ORDER — DEXMEDETOMIDINE HCL IN NACL 400 MCG/100ML IV SOLN
0.1000 ug/kg/h | INTRAVENOUS | Status: DC
  Administered 2022-01-08 (×2): 0.6 ug/kg/h via INTRAVENOUS
  Filled 2022-01-08 (×2): qty 100

## 2022-01-08 MED ORDER — PROSOURCE PLUS PO LIQD
30.0000 mL | Freq: Every day | ORAL | Status: DC
  Administered 2022-01-08 – 2022-01-10 (×3): 30 mL via ORAL
  Filled 2022-01-08 (×3): qty 30

## 2022-01-08 MED ORDER — MELATONIN 3 MG PO TABS
3.0000 mg | ORAL_TABLET | Freq: Every day | ORAL | Status: AC
  Administered 2022-01-08: 3 mg
  Filled 2022-01-08: qty 1

## 2022-01-08 MED ORDER — FREE WATER
50.0000 mL | Status: DC
Start: 2022-01-08 — End: 2022-01-10
  Administered 2022-01-08 – 2022-01-10 (×9): 50 mL

## 2022-01-08 MED ORDER — HALOPERIDOL LACTATE 5 MG/ML IJ SOLN
2.0000 mg | Freq: Once | INTRAMUSCULAR | Status: AC
  Administered 2022-01-08: 2 mg via INTRAVENOUS
  Filled 2022-01-08: qty 1

## 2022-01-08 MED ORDER — MELATONIN 3 MG PO TABS: 3.0000 mg | ORAL_TABLET | Freq: Every day | ORAL | Status: DC

## 2022-01-08 MED ORDER — OSMOLITE 1.2 CAL PO LIQD
1000.0000 mL | ORAL | Status: DC
  Administered 2022-01-08: 1000 mL
  Filled 2022-01-08 (×5): qty 1000

## 2022-01-08 NOTE — Progress Notes (Signed)
NAME:  [name], MRN:  [id number], DOB:  05/03/1941, LOS: 7 ADMISSION DATE:  12/09/2021, CONSULTATION DATE:  01/03/22 REFERRING MD:  Graciella Freer, CHIEF COMPLAINT:  cardiac arrest   History of Present Illness:  81 year old woman w/ hx of Afib on xarelto who presented with R sided pleurisy, cough, fevers.  Found to have R sided pneumonia, new hypoxemia, afib/RVR, and admitted to progressive.  Also tx empirically for possible COPD exacerbation.  Today worsening SOB then found pulseless.  7 min CPR, PEA.  Some purposeful movement after arrest.  PCCM consulted.  Pertinent  Medical History  Breast ca on letrozole Afib on [place] Hospital Events: Including procedures, antibiotic start and stop dates in addition to other pertinent events   7/28 admit, Ceftriaxone/Azithro 7/29 last dose xeralto  7/30 IHCA x 7 min, Ceftriaxone>Cefepime 7/31 Chest tube placed, heparin gtt started 8/1 patient remove chest tube.  Head CT obtained >negative.  Severely agitated overnight.  Vancomycin stopped.  Azithromycin stopped.  Interim History / Subjective:  Patient remained agitated and confused Restarted back on Precedex   Objective   Blood pressure 112/80, pulse 86, temperature 98.5 F (36.9 C), temperature source Axillary, resp. rate 20, height 5' (1.524 m), weight 67 kg, SpO2 96 %.        Intake/Output Summary (Last 24 hours) at 01/08/2022 1137 Last data filed at 01/08/2022 1000 Gross per 24 hour  Intake 746.8 ml  Output 1190 ml  Net -443.2 ml   Filed Weights   01/06/22 0500 01/07/22 0404 01/08/22 0500  Weight: 69.7 kg 66.7 kg 67 kg    Examination: Physical exam: General: Acute on chronically ill-appearing female, lying on the bed HEENT: [place]/AT, eyes anicteric.  Dry mucus membranes Neuro: Awake, confused, disoriented, paranoid Chest: Coarse breath sounds, no wheezes or rhonchi Heart: Regular rate and rhythm, no murmurs or gallops Abdomen: Soft, nontender, nondistended, bowel  sounds present Skin: No rash   Resolved Hospital Problem list   Hypokalemia/hypomagnesemia/hypophosphatemia/hyponatremia  Assessment & Plan:  In hospital cardiac arrest, PEA arrest, in the setting of hypoxemia and hypokalemia. 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  Basal LV segments demonstrate normal contractility.  Findings most consistent with Takotsubo cardiomyopathy.  Moderate mitral valve regurgitation.  Mild to moderate tricuspid valve regurgitation. Troponin 131 > 55.  CVP 8 on 8/3 Continue supportive care Continue metoprolol per tube Heart rate is well controlled now We will switch heparin to [place] cardiology follow-up Closely monitor electrolytes  Right lower lobe pneumonia Acute respiratory failure with hypoxia, secondary to pneumonia- improving Right parapneumonic pleural effusion, status post chest tube placement  Patient remained on 3 L nasal cannula oxygen, continue to titrate with O2 sat goal 92%  Continue IV cefepime to complete 7-day therapy Chest tube was dislodged 2 days ago  Continue Pulmicort Brovana and Yupelri, PRN albuterol  ICU delirium Patient remained Agitated and restless throughout the night Currently she is paranoid She required Precedex infusion We will give her 1 dose of Haldol Started on Seroquel twice daily Continue melatonin to help her sleep at night Head CT and EEG was negative for acute findings   Best Practice (right click and "Reselect all SmartList Selections" daily)   Diet/type: Tube feed DVT prophylaxis: DOAC GI prophylaxis: PPI Lines: Central line and yes and it is still needed Foley:  Yes, and it is no longer needed and  removal ordered  Code Status:  full code Last date of multidisciplinary goals of care discussion [husband updated at bedside on 8/2]   Critical care time: 35 minutes     Critical care time was exclusive of separately billable procedures and treating other patients.   Critical care was necessary to treat or prevent imminent or life-threatening deterioration.   Critical care was time spent personally by me on the following activities: development of treatment plan with patient and/or surrogate as well as nursing, discussions with consultants, evaluation of patient's response to treatment, examination of patient, obtaining history from patient or surrogate, ordering and performing treatments and interventions, ordering and review of laboratory studies, ordering and review of radiographic studies, pulse oximetry and re-evaluation of patient's condition.   Jacky Kindle, MD [facility] See Amion for pager If no response to pager, please call [phone] until 7pm After 7pm, Please call E-link [phone]

## 2022-01-08 NOTE — Progress Notes (Signed)
Called Dr Tacy Learn regarding patients heart rate since stopping the precedex with it hanging out in the 120-150 range.  Informed her metoprolol was due this evening on night shift and that she just looks anxious and was asking for medications for her nerves stated she was shaking and needed something.  X1 '1mg'$  Clonazepam ordered and given via Cortrak will continue to monitor.

## 2022-01-08 NOTE — Progress Notes (Signed)
Patient has remained alert to self this shift.  Continues to be very confused and stating..."I'm in prison in [place] on death row for my traffic violations but I paid the fines." Continues to tell RN almost every time that she is being executed.  Patient has not been able to be reoriented that these things were not going to happen even when spouse was at bedside and letting her speak with son via speaker phone.  Cortrak was placed twice this shift by dietician.  Patient somehow got out of left mitten with husband at bedside and pulled out tube while RN was gathering supplies to start tube feeds.  After placing the second time she was asking if restraints and mitts could be removed that she wasn't going to pull it again because she didn't want to go thru that again, informed patient that we would assess restraints and see when they would be able to be removed.  When attempted to give patient a few sips of water per speech recommendation if she asked for it.  Patient took a large sip swished her mouth and spit it all in her bed this was done twice.  Later in the day she asked for more water and drank appropriately with no s/s of aspiration noted.  Patient resting this evening when Dr Tacy Learn making rounds and he asked that we attempt to hold precedex so that we can give scheduled meds this evening for her to sleep tonight-  Precedex was placed on hold.  Will continue to monitor.

## 2022-01-08 NOTE — Progress Notes (Signed)
Speech Language Pathology Treatment: Dysphagia  Patient Details Name: [name] MRN: [id number] DOB: 12-Oct-1940 Today's Date: 01/08/2022 Time: 2446-2863 SLP Time Calculation (min) (ACUTE ONLY): 14 min  Assessment / Plan / Recommendation Clinical Impression  Pt remains confused/delirious.  Pt intermittently dozing off during assessment, 2/2 lack of sleep and recent administration of haldol. RN reports pt has been orally defensive and that she will not take medications or participate in oral care.  SLP provided oral care after PO trials today to minimize agitation with attempted administration of bolus trials.  Pt repeatedly asserted she could have "nothing by mouth" noting that it said so on the board.  With significant encouragement, pt acknowledged that she was hungry/thirsty and accepted some PO trials.  Today pt accepted sips of water by cup and straw and swallowed without any clinical s/s of aspiration.  Pt was unable to bite down on solid cracker.  Pt expectorated a piece of cracker which was placed in oral cavity.  SLP placed spoon of puree in oral cavity and pt immediately fell asleep.  SLP removed applesauce with suction.  SLP provided oral care removing some dried secretions from hard palate. 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  Pt became more alert following oral care.  Puree was again offered, which she refused.  Pt also spit out a further trial of thin liquid by cup.  Given AMS, pt will not be able to meet nutritional needs orally.  Recommend proceeding with cortrak placement for nutrition and medication.  Recommend pt remain NPO with short term alternate means of nutrition, hydration, and medication.  Pt may have small sips of water for comfort, if desired, after good oral care, in moderation, when fully awake/alert, with upright positioning and supervision.   HPI HPI: Patient is a 81 y.o. female with PMH: afib on Xaralto, asthma,  GERD, essential HTN, chronic diastolic CHF, right breast cancer, h/o syncope, generalized anxiety disorder, moderate pulmonary  hypertension. She [facility] with SOB, productive cough and right sided chest pain for three days prior to admission. On EMS arrival, patient was hypoxic on RA at 85% and was placed on NRB. In ED she had a Tmax of 103. CXR showed right-sided PNA. On 7/30, rapid respose was called due to patient found unresponsive in hospital bed. CPR started and ROSC achieved after 7 minutes. Patient was intubated by CRNA and transferred to [facility]. On 8/2 she was extubated to supplemental oxygen via [place]      SLP Plan  Continue with current plan of care      Recommendations for follow up therapy are one component of a multi-disciplinary discharge planning process, led by the attending physician.  Recommendations may be updated based on patient status, additional functional criteria and insurance authorization.    Recommendations  Diet recommendations: NPO Medication Administration: Via alternative means                Oral Care Recommendations: Oral care QID;Oral care prior to ice chip/H20;Staff/trained caregiver to provide oral care Follow Up Recommendations:  (TBD) Assistance recommended at discharge: Frequent or constant Supervision/Assistance SLP Visit Diagnosis: Dysphagia, unspecified (R13.10) Plan: Continue with current plan of care           Celedonio Savage, Agency, [place] Office: [phone] 01/08/2022, 10:00 AM

## 2022-01-08 NOTE — Progress Notes (Signed)
Progress Note  Patient Name: JADIA CAPERS Date of Encounter: 01/08/2022  Primary Cardiologist:   Kirk Ruths, MD   Subjective   Very confused. Not able to give adequate review of systems  Inpatient Medications    Scheduled Meds:  arformoterol  15 mcg Nebulization BID   budesonide (PULMICORT) nebulizer solution  0.25 mg Nebulization BID   haloperidol lactate  2 mg Intravenous Once   insulin aspart  0-9 Units Subcutaneous Q4H   metoprolol tartrate  25 mg Oral BID   midazolam  2 mg Intravenous Once   mouth rinse  15 mL Mouth Rinse 4 times per day   polyethylene glycol  17 g Oral Daily   revefenacin  175 mcg Nebulization Daily   Continuous Infusions:  ceFEPime (MAXIPIME) IV 2 g (01/08/22 0853)   dexmedetomidine (PRECEDEX) IV infusion 0.6 mcg/kg/hr (01/08/22 0730)   feeding supplement (VITAL AF 1.2 CAL) Stopped (01/05/22 2150)   heparin 950 Units/hr (01/08/22 0827)   PRN Meds: acetaminophen, albuterol, melatonin, midazolam, mouth rinse, polyethylene glycol   Vital Signs    Vitals:   01/08/22 0730 01/08/22 0800 01/08/22 0826 01/08/22 0827  BP:  (!) 130/100    Pulse: 87     Resp: 20 20    Temp:  98.5 F (36.9 C)    TempSrc:  Axillary    SpO2: 93%  93% 93%  Weight:      Height:        Intake/Output Summary (Last 24 hours) at 01/08/2022 0858 Last data filed at 01/08/2022 0730 Gross per 24 hour  Intake 808.99 ml  Output 1190 ml  Net -381.01 ml   Filed Weights   01/06/22 0500 01/07/22 0404 01/08/22 0500  Weight: 69.7 kg 66.7 kg 67 kg    Telemetry    Atrial fib with rapid controlled ventricular rate - Personally Reviewed  ECG    NA - Personally Reviewed  Physical Exam   GEN: No  acute distress.   Neck: No  JVD Cardiac: Irregular RR, soft apical systolic murmur, rubs, or gallops.  Respiratory:     Decreased breath sounds bilaterally  to auscultation bilaterally. GI: Soft, nontender, non-distended, normal bowel sounds  MS:  No edema; No  deformity. Neuro:   Nonfocal  Psych: Oriented and appropriate    Labs    Chemistry Recent Labs  Lab 01/04/22 0152 01/04/22 0513 01/04/22 1808 01/05/22 0105 01/06/22 0432 01/06/22 0447 01/07/22 0408 01/08/22 0430  NA 127*   < > 129* 130*   < > 132* 134* 136  K 4.2   < > 3.4* 3.3*   < > 3.5 3.4* 3.7  CL 90*   < > 93* 96*  --  100 100 101  CO2 25   < > 25 24  --  '25 27 27  '$ GLUCOSE 151*   < > 143* 192*  --  113* 106* 137*  BUN 15   < > 15 15  --  22 23 26*  CREATININE 0.73   < > 0.67 0.66  --  0.71 0.66 0.65  CALCIUM 9.0   < > 9.1 9.0  --  8.9 8.8* 9.2  PROT 5.9*  --  5.6* 5.3*  --  5.8*  --   --   ALBUMIN 2.4*  --   --  2.1*  --  2.3*  --   --   AST 69*  --   --  25  --  31  --   --  ALT 26  --   --  21  --  22  --   --   ALKPHOS 64  --   --  52  --  77  --   --   BILITOT 2.1*  --   --  1.3*  --  1.1  --   --   GFRNONAA >60   < > >60 >60  --  >60 >60 >60  ANIONGAP 12   < > 11 10  --  '7 7 8   '$ < > = values in this interval not displayed.     Hematology Recent Labs  Lab 01/06/22 0447 01/07/22 0408 01/08/22 0430  WBC 17.6* 12.3* 16.8*  RBC 3.04* 3.01* 3.05*  HGB 11.1* 11.1* 11.1*  HCT 32.4* 31.2* 32.1*  MCV 106.6* 103.7* 105.2*  MCH 36.5* 36.9* 36.4*  MCHC 34.3 35.6 34.6  RDW 13.3 13.0 13.3  PLT 156 132* 171    Cardiac EnzymesNo results for input(s): "TROPONINI" in the last 168 hours. No results for input(s): "TROPIPOC" in the last 168 hours.   BNP Recent Labs  Lab 12/24/2021 1813 12/29/2021 2249  BNP 311.6* 479.9*     DDimer  Recent Labs  Lab 01/02/22 1308  DDIMER 1.11*     Radiology    EEG adult  Result Date: 01/06/2022 Lora Havens, MD     01/06/2022  9:43 AM Patient Name: HAISLEE CORSO MRN: [id number] Epilepsy Attending: Lora Havens Referring Physician/Provider: Frederik Pear, MD Date: 01/06/2022 Duration: 24.40 mins Patient history: 81 year old female with cardiac arrest.  EEG to evaluate for seizure. Level of alertness: lethargic AEDs  during EEG study: None Technical aspects: This EEG study was done with scalp electrodes positioned according to the 10-20 International system of electrode placement. Electrical activity was reviewed with band pass filter of 1-'70Hz'$ , sensitivity of 7 uV/mm, display speed of 25m/sec with a '60Hz'$  notched filter applied as appropriate. EEG data were recorded continuously and digitally stored.  Video monitoring was available and reviewed as appropriate. Description: No clear posterior dominant rhythm was seen.  EEG showed continuous generalized 3 to 6 Hz theta-delta Slowing at times with triphasic morphology.  Hyperventilation and photic stimulation were not performed.   ABNORMALITY - Continuous slow, generalized IMPRESSION: This study is suggestive of moderate to severe diffuse encephalopathy, nonspecific etiology. No seizures or epileptiform discharges were seen throughout the recording. Priyanka OBarbra Sarks   Cardiac Studies   Echo:  See above.   Patient Profile     81y.o. female with a hx of atrial fib on propranolol 80 mg qd and Xarelto, HTN, asthma, breast CA, anxiety, who is being seen 01/02/2022 for the evaluation of CHF & rapid atrial fib at the request of Dr OMarthenia Rolling Now found to have probable Takotsubo cardiomyopathy.    Assessment & Plan    Atrial fib with RVR:      Rate controlled.  Change to DOAC when taking POs.  Continue heparin  AI/MR:  Noted on prelim echo review.  Mild/Mod AI likely with at least moderate MR.  (MR worse than previous likely functional.)   Follow clinically.    Acute systolic HF:   EF markedly reduced with possible Takotsubo.   Net negative 2.2 liters.  Hs not gotten beta blocker as she was not taking POs.  Will start metoprolol via tube when placed.     For questions or updates, please contact CMountain PinePlease consult [email or web address] for contact info under Cardiology/STEMI.  Signed, Minus Breeding, MD  01/08/2022, 8:58 AM

## 2022-01-08 NOTE — Progress Notes (Signed)
Lying in bed eyes opened. Has not slept but for maybe 20 minutes total during the shift. She remains confused but cooperative at times. Alert to person only. Incontinent of urine after bladder scan performed. Bladder scan again after she voided and 0 mL recorded. During the bladder scan , she says, "Oh, I am pregnant. I see the baby right there." Attempted to educate on the bladder scan and purpose w/out success. Continues to say, "I am the oldest person 81 years old to be pregnant." Continues to remain fixated on the conversation that she is pregnant regardless of attempts to reorient and redirect. Continuing to monitor.

## 2022-01-08 NOTE — Progress Notes (Signed)
PT Cancellation Note  Patient Details Name: [name] MRN: [id number] DOB: 1940-08-21   Cancelled Treatment:    Reason Eval/Treat Not Completed: Patient not medically ready - per RN, pt with persistent AMS, hallucinations, agitation; will check back for appropriateness of participation with PT treatment.  [name], PT, DPT Acute Rehabilitation Services  Personal: [facility] Office: [place] 01/08/2022, 7:55 AM

## 2022-01-08 NOTE — Progress Notes (Signed)
Nutrition Follow-up  DOCUMENTATION CODES:   Not applicable  INTERVENTION:   Tube Feeding via Cortrak:  Osmolite 1.2 at 50 ml/hr Pro-Source TF 45 mL daily This provides 1480 kcals, 78 g of protein, 972 mL of free water   Add free water flush of 50 mL q 4 hours; total free water of 1272 mL.    NUTRITION DIAGNOSIS:   Inadequate oral intake related to acute illness as evidenced by NPO status.  Being addressed via TF   GOAL:   Patient will meet greater than or equal to 90% of their needs  Progressing   MONITOR:   TF tolerance, Vent status, Labs, Weight trends  REASON FOR ASSESSMENT:   Consult, Ventilator Enteral/tube feeding initiation and management  ASSESSMENT:   81 yo female admitted with acute respiratory failure related to COPD exacerbation and pneumonia, went into PEA arrest and required intubation. PMH includes A.fib, breast cancer, HTN, GERD, asthma, melanoma  7/28 Admit 7/30 IHCA x 7 minutes, Intubated 7/31 Chest tube placed 8/01 Pt pulled out chest tube, CT head negative 8/02 Extubated 8/04 Cortrak placed  Pt is alert but confused. ICU delerium per MD note, Pt on precedex infusion. Husband at bedside.   NPO, Cortrak placed today. Pt later pulled Cortrak out despite being in restraints with mittens on. Cortrak replaced.   SLP performed bed side eval today and pt  RN gave pt water today and pt spit it out everywhere.   Labs: reviewed Meds: precedex, ss novolog, miralax   Diet Order:   Diet Order             Diet NPO time specified  Diet effective now                   EDUCATION NEEDS:   Not appropriate for education at this time  Skin:  Skin Assessment: Reviewed RN Assessment  Last BM:  8/02  Height:   Ht Readings from Last 1 Encounters:  01/04/22 5' (1.524 m)    Weight:   Wt Readings from Last 1 Encounters:  01/08/22 67 kg     BMI:  Body mass index is 28.85 kg/m.  Estimated Nutritional Needs:   Kcal:  1400-1600  kcals  Protein:  75-85 g  Fluid:  >/= 1.5 L    Kerman Passey MS, RDN, LDN, CNSC Registered Dietitian 3 Clinical Nutrition RD Pager and On-Call Pager Number Located in [place]

## 2022-01-08 NOTE — Progress Notes (Signed)
[place] Progress Note Patient Name: [name] DOB: June 02, 1941 MRN: [id number]   Date of Service  01/08/2022  HPI/Events of Note  Agitation - Nursing request to renew Precedex IV infusion order.   eICU Interventions  Will renew Precedex IV infusion order.      Intervention Category Major Interventions: Delirium, psychosis, severe agitation - evaluation and management  Saundra Gin [place] 01/08/2022, 4:12 AM

## 2022-01-08 NOTE — Progress Notes (Addendum)
ANTICOAGULATION CONSULT NOTE   Pharmacy Consult for Heparin Indication: atrial fibrillation  Allergies  Allergen Reactions   Chlorhexidine Gluconate Hives, Swelling and Rash       "ChloraPrep "   Doxycycline Nausea And Vomiting   Codeine Nausea Only and Other (See Comments)    Pt can tolerate when pre-medicated   Doxycycline Nausea And Vomiting   Nsaids     avoid due to gerd   Nsaids Other (See Comments)    Gi bleed   Tylenol [Acetaminophen]     Avoid due to GERD   Adhesive [Tape] Rash    Dermabond   Chlorhexidine Swelling and Rash   Codeine Nausea Only   Tylenol [Acetaminophen] Other (See Comments)    GI bleed    Patient Measurements: Height: 5' (152.4 cm) Weight: 67 kg (147 lb 11.3 oz) IBW/kg (Calculated) : 45.5 Heparin Dosing Weight: 60 kg   Vital Signs: Temp: 98.1 F (36.7 C) (08/04 0400) Temp Source: Axillary (08/04 0400) BP: 124/86 (08/04 0700) Pulse Rate: 87 (08/04 0730)  Labs: Recent Labs    01/06/22 0447 01/06/22 1132 01/06/22 2205 01/07/22 0408 01/07/22 1240 01/08/22 0430  HGB 11.1*  --   --  11.1*  --  11.1*  HCT 32.4*  --   --  31.2*  --  32.1*  PLT 156  --   --  132*  --  171  APTT 34 58*   < > 95* 95* 53*  HEPARINUNFRC 1.05* 0.89*  --  0.80*  --  0.59  CREATININE 0.71  --   --  0.66  --  0.65   < > = values in this interval not displayed.     Estimated Creatinine Clearance: 49.5 mL/min (by C-G formula based on SCr of 0.65 mg/dL).   Medical History: Past Medical History:  Diagnosis Date   Anemia    history of   Anxiety    Arthritis    Asthma    Breast cancer ([place])    Cancer ([place])    skin cancers, one melanoma    Complication of anesthesia    patient woke up during colonoscopy   Concussion    8 yrs. ago due to being thrown from a horse   Dislocated elbow 10/12/2016   left   Family history of breast cancer    GERD (gastroesophageal reflux disease)    History of kidney stones    History of radiation therapy 03/23/17-05/04/17    right chest wall 50.4 Gy in 28 fractions, right axilla 45 Gy in 25 fractions   Hypertension    Hypertensive heart disease without CHF    Keratosis, actinic    Melanoma (HCC)    Persistent atrial fibrillation (HCC) 12/16/2016   CHA2DS2VASC score 3   Pneumonia    hx of     Medications:  Scheduled:   arformoterol  15 mcg Nebulization BID   budesonide (PULMICORT) nebulizer solution  0.25 mg Nebulization BID   insulin aspart  0-9 Units Subcutaneous Q4H   metoprolol tartrate  25 mg Oral BID   midazolam  2 mg Intravenous Once   OLANZapine zydis  2.5 mg Oral Once   mouth rinse  15 mL Mouth Rinse 4 times per day   polyethylene glycol  17 g Oral Daily   revefenacin  175 mcg Nebulization Daily    Assessment: 49 yof presenting with acute hypoxemia related to COPD excerbation/PNA complicated by PEA arrest  - on Xarelto PTA for Afib (LD 7/29).   Heparin  level still not correlating (therapeutic at 0.59) whereas aPTT is subtherapeutic at 53, on 900 units/hr. No s/sx of bleeding or infusion issues.  Given recent DOAC will monitor both aPTT and heparin level until correlate.   Goal of Therapy:  Heparin level 0.3-0.7 units/ml aPTT 66-102 seconds Monitor platelets by anticoagulation protocol: Yes   Plan:  Increase heparin to 950 units/hr Heparin level and aPTT in 8 hr Plan to change to DOAC once have enteral access Monitor CBC, and for s/sx of bleeding   Antonietta Jewel, PharmD, [place] Pharmacist  Phone: [phone] 01/08/2022 7:56 AM  Please check AMION for all [place] phone numbers After 10:00 PM, call [place] [phone]  ADDENDUM Transition from heparin infusion to Xarelto on 8/4'@1700'$  - will continue heparin at 950 units/hr until give Xarelto then can stop.  Antonietta Jewel, PharmD, [place] Clinical Pharmacist

## 2022-01-08 NOTE — TOC Progression Note (Signed)
Transition of Care [facility]) - Progression Note    Patient Details  Name: [name] MRN: [id number] Date of Birth: 1940/12/28  Transition of [facility]) CM/SW [place], Lillie Phone Number: 01/08/2022, 12:11 PM  Clinical Narrative:     CSW following to fax out for SNF when appropriate. Patient has cortrak. CSW will continue to follow and assist with patients dc planning needs.  Expected Discharge Plan: [place] Barriers to Discharge: Continued Medical Work up  Expected Discharge Plan and Services Expected Discharge Plan: [place] In-house Referral: Clinical Social Work Discharge Planning Services: CM Consult Post Acute Care Choice: [place] arrangements for the past 2 months: Single Family Home                                       Social Determinants of Health (SDOH) Interventions    Readmission Risk Interventions     No data to display

## 2022-01-08 NOTE — Procedures (Addendum)
Cortrak  Person Inserting Tube:  Alroy Dust, Irania Durell L, RD Tube Type:  Cortrak - 43 inches Tube Size:  10 Tube Location:  Right nare Secured by: Bridle Technique Used to Measure Tube Placement:  Marking at nare/corner of mouth Cortrak Secured At:  70 cm   Cortrak Tube Team Note:  Consult received to place a Cortrak feeding tube.   Addendum @ 3:16 pm: RD received a secure chat from RN, pt pulled Cortrak tube. Tube was replaced and placed in right nare at 70 cm.   X-ray is required, abdominal x-ray has been ordered by the Cortrak team. Please confirm tube placement before using the Cortrak tube.   If the tube becomes dislodged please keep the tube and contact the Cortrak team at [email or web address] ([email or web address]) for replacement.  If after hours and replacement cannot be delayed, place a NG tube and confirm placement with an abdominal x-ray.    Hermina Barters RD, LDN Clinical Dietitian See Shea Evans for contact information.

## 2022-01-09 DIAGNOSIS — I469 Cardiac arrest, cause unspecified: Secondary | ICD-10-CM | POA: Diagnosis not present

## 2022-01-09 DIAGNOSIS — I5181 Takotsubo syndrome: Secondary | ICD-10-CM

## 2022-01-09 DIAGNOSIS — J189 Pneumonia, unspecified organism: Secondary | ICD-10-CM | POA: Diagnosis not present

## 2022-01-09 DIAGNOSIS — J9601 Acute respiratory failure with hypoxia: Secondary | ICD-10-CM | POA: Diagnosis not present

## 2022-01-09 DIAGNOSIS — I480 Paroxysmal atrial fibrillation: Secondary | ICD-10-CM

## 2022-01-09 LAB — BASIC METABOLIC PANEL
Anion gap: 11 (ref 5–15)
BUN: 30 mg/dL — ABNORMAL HIGH (ref 8–23)
CO2: 27 mmol/L (ref 22–32)
Calcium: 9.5 mg/dL (ref 8.9–10.3)
Chloride: 100 mmol/L (ref 98–111)
Creatinine, Ser: 0.6 mg/dL (ref 0.44–1.00)
GFR, Estimated: >60 mL/min (ref >60)
Glucose, Bld: 172 mg/dL — ABNORMAL HIGH (ref 70–99)
Potassium: 3.4 mmol/L — ABNORMAL LOW (ref 3.5–5.1)
Sodium: 138 mmol/L (ref 135–145)

## 2022-01-09 LAB — GLUCOSE, CAPILLARY
Glucose-Capillary: 160 mg/dL — ABNORMAL HIGH (ref 70–99)
Glucose-Capillary: 110 mg/dL — ABNORMAL HIGH (ref 70–99)
Glucose-Capillary: 125 mg/dL — ABNORMAL HIGH (ref 70–99)
Glucose-Capillary: 160 mg/dL — ABNORMAL HIGH (ref 70–99)
Glucose-Capillary: 79 mg/dL (ref 70–99)
Glucose-Capillary: 110 mg/dL — ABNORMAL HIGH (ref 70–99)

## 2022-01-09 LAB — MAGNESIUM: Magnesium: 2 mg/dL (ref 1.7–2.4)

## 2022-01-09 MED ORDER — ALPRAZOLAM 0.25 MG PO TABS
0.2500 mg | ORAL_TABLET | Freq: Three times a day (TID) | ORAL | Status: DC | PRN
  Administered 2022-01-09: 0.25 mg via ORAL
  Filled 2022-01-09: qty 1

## 2022-01-09 MED ORDER — POTASSIUM CHLORIDE 20 MEQ PO PACK
40.0000 meq | PACK | Freq: Once | ORAL | Status: AC
  Administered 2022-01-09: 40 meq
  Filled 2022-01-09: qty 2

## 2022-01-09 MED ORDER — SENNOSIDES 8.8 MG/5ML PO SYRP
10.0000 mL | ORAL_SOLUTION | Freq: Every day | ORAL | Status: DC
Start: 2022-01-09 — End: 2022-01-10
  Administered 2022-01-09: 10 mL via ORAL
  Filled 2022-01-09: qty 10

## 2022-01-09 MED ORDER — ACETAMINOPHEN 325 MG PO TABS
650.0000 mg | ORAL_TABLET | Freq: Four times a day (QID) | ORAL | Status: DC | PRN
  Administered 2022-01-09: 650 mg via ORAL
  Filled 2022-01-09: qty 2

## 2022-01-09 MED ORDER — METOPROLOL TARTRATE 5 MG/5ML IV SOLN
5.0000 mg | Freq: Three times a day (TID) | INTRAVENOUS | Status: DC | PRN
  Administered 2022-01-09 – 2022-01-10 (×2): 5 mg via INTRAVENOUS
  Filled 2022-01-09 (×2): qty 5

## 2022-01-09 MED ORDER — LOSARTAN POTASSIUM 25 MG PO TABS
25.0000 mg | ORAL_TABLET | Freq: Every day | ORAL | Status: DC
Start: 2022-01-09 — End: 2022-01-10
  Administered 2022-01-09 – 2022-01-10 (×2): 25 mg via ORAL
  Filled 2022-01-09 (×2): qty 1

## 2022-01-09 MED ORDER — METOPROLOL TARTRATE 5 MG/5ML IV SOLN
5.0000 mg | Freq: Three times a day (TID) | INTRAVENOUS | Status: DC | PRN
  Administered 2022-01-09: 5 mg via INTRAVENOUS
  Filled 2022-01-09 (×2): qty 5

## 2022-01-09 MED ORDER — METOPROLOL TARTRATE 25 MG PO TABS
25.0000 mg | ORAL_TABLET | Freq: Three times a day (TID) | ORAL | Status: DC
  Administered 2022-01-09: 25 mg
  Filled 2022-01-09: qty 1

## 2022-01-09 MED ORDER — DOCUSATE SODIUM 50 MG/5ML PO LIQD
100.0000 mg | Freq: Every day | ORAL | Status: DC
  Administered 2022-01-09: 100 mg
  Filled 2022-01-09: qty 10

## 2022-01-09 MED ORDER — METOPROLOL TARTRATE 50 MG PO TABS
50.0000 mg | ORAL_TABLET | Freq: Three times a day (TID) | ORAL | Status: DC
  Administered 2022-01-09 – 2022-01-10 (×3): 50 mg
  Filled 2022-01-09 (×3): qty 1

## 2022-01-09 NOTE — Progress Notes (Signed)
Physical Therapy Treatment Patient Details Name: [name] MRN: [id number] DOB: 06-May-1941 Today's Date: 01/09/2022   History of Present Illness Pt is a 81 y.o. female admitted 12/20/2021 with SOB, productive cough, R-side chest pain. Workup for sepsis secondary to CAP. Pt confused 7/30, head CT negative for acute abnormality. Code blue 7/30 due to episode of unresponsiveness; ROSC achieved after 7 min CPR, pt intubated. Chest tube insertion 7/31; pt removed 8/1. ETT 7/30-8/2. Continued AMS, hallucinations overnight 8/2; head CT 8/2 remains negative for acute abnormality. EEG 8/2 with no seizure activity. Improved cognition 8/5, but persistent tachycardia/afib with RVR. PMH includes afib, HTN, breast CA, melanoma, asthma, bilateral TKA, anxiety.   PT Comments    Pt progressing well with mobility, significant improvement with cognition today. Pt tolerated short bout of ambulation in room with RW and min guard; mobility limited by tachycardia 146-171 with activity (RN present and aware). Pt and husband hopeful pt can progress well enough to d/c home, but understand potential need for SNF pending activity progression. Will continue to follow acutely to address established goals.    Recommendations for follow up therapy are one component of a multi-disciplinary discharge planning process, led by the attending physician.  Recommendations may be updated based on patient status, additional functional criteria and insurance authorization.  Follow Up Recommendations  Skilled nursing-short term rehab (<3 hours/day) - hopeful to progress to home Can patient physically be transported by private vehicle: Yes   Assistance Recommended at Discharge Intermittent Supervision/Assistance  Patient can return home with the following A little help with walking and/or transfers;A little help with bathing/dressing/bathroom;Assistance with cooking/housework;Assist for transportation;Help with stairs or ramp for entrance    Equipment Recommendations  None recommended by PT    Recommendations for Other Services       Precautions / Restrictions Precautions Precautions: Fall;Other (comment) Precaution Comments: watch HR Restrictions Weight Bearing Restrictions: No     Mobility  Bed Mobility Overal bed mobility: Needs Assistance Bed Mobility: Supine to Sit     Supine to sit: Min assist, HOB elevated     General bed mobility comments: MinA for HHA to scoot hips to EOB    Transfers Overall transfer level: Needs assistance Equipment used: 1 person hand held assist Transfers: Sit to/from Stand Sit to Stand: Min assist           General transfer comment: minA for HHA to stabilize upon standing; offerred RW which pt agrees she needs to hold onto    Ambulation/Gait Ambulation/Gait assistance: Min guard Social research officer, government (Feet): 14 Feet Assistive device: Rolling walker (2 wheels) Gait Pattern/deviations: Step-to pattern, Step-through pattern, Decreased stride length, Trunk flexed Gait velocity: Decreased     General Gait Details: slow, fatigued gait with RW and min guard for balance; further distance limited by HR 171   Stairs             Wheelchair Mobility    Modified Rankin (Stroke Patients Only)       Balance Overall balance assessment: Needs assistance   Sitting balance-Leahy Scale: Fair       Standing balance-Leahy Scale: Fair Standing balance comment: can static stand without UE support, static stability improved with HHA then RW                            Cognition Arousal/Alertness: Awake/alert Behavior During Therapy: WFL for tasks assessed/performed Overall Cognitive Status: Within Functional Limits for tasks assessed  General Comments: WFL for simple tasks, but fatigued; does not recall events from past couple days, but aware of what she has been told by staff.        Exercises      General  Comments General comments (skin integrity, edema, etc.): pt's husband present and supportive; pt and husband hopeful she can progress to home, but understand if she needs SNF to regain strength/independence. HR 146-171 with activity      Pertinent Vitals/Pain Pain Assessment Pain Assessment: No/denies pain Pain Intervention(s): Monitored during session    Home Living                          Prior Function            PT Goals (current goals can now be found in the care plan section) Acute Rehab PT Goals Patient Stated Goal: hope to be able to d/c home PT Goal Formulation: With patient/family Time For Goal Achievement: 01/21/22 Potential to Achieve Goals: Good Progress towards PT goals: Progressing toward goals    Frequency    Min 3X/week      PT Plan Current plan remains appropriate    Co-evaluation              AM-PAC PT "6 Clicks" Mobility   Outcome Measure  Help needed turning from your back to your side while in a flat bed without using bedrails?: A Little Help needed moving from lying on your back to sitting on the side of a flat bed without using bedrails?: A Little Help needed moving to and from a bed to a chair (including a wheelchair)?: A Little Help needed standing up from a chair using your arms (e.g., wheelchair or bedside chair)?: A Little Help needed to walk in hospital room?: A Little Help needed climbing 3-5 steps with a railing? : A Little 6 Click Score: 18    End of Session   Activity Tolerance: Patient tolerated treatment well;Treatment limited secondary to medical complications (Comment) (limited by tachycardia) Patient left: in chair;with call bell/phone within reach;with nursing/sitter in room;with family/visitor present Nurse Communication: Mobility status PT Visit Diagnosis: Other abnormalities of gait and mobility (R26.89);Muscle weakness (generalized) (M62.81)     Time: 8657-8469 PT Time Calculation (min) (ACUTE ONLY):  17 min  Charges:  $Therapeutic Activity: 8-22 mins                     [name], PT, DPT Acute Rehabilitation Services  Personal: [place] [facility] Office: [place] 01/09/2022, 1:26 PM

## 2022-01-09 NOTE — Progress Notes (Signed)
NAME:  [name], MRN:  [id number], DOB:  [birth year], LOS: 8 ADMISSION DATE:  12/25/2021, CONSULTATION DATE:  01/03/22 REFERRING MD:  Graciella Freer, CHIEF COMPLAINT:  cardiac arrest   History of Present Illness:  82 year old woman w/ hx of Afib on xarelto who presented with R sided pleurisy, cough, fevers.  Found to have R sided pneumonia, new hypoxemia, afib/RVR, and admitted to progressive.  Also tx empirically for possible COPD exacerbation.  Today worsening SOB then found pulseless.  7 min CPR, PEA.  Some purposeful movement after arrest.  PCCM consulted.  Pertinent  Medical History  Breast ca on letrozole Afib on [place] [facility] Events: Including procedures, antibiotic start and stop dates in addition to other pertinent events   7/28 admit, Ceftriaxone/Azithro 7/29 last dose xeralto  7/30 IHCA x 7 min, Ceftriaxone>Cefepime 7/31 Chest tube placed, heparin gtt started 8/1 patient remove chest tube.  Head CT obtained >negative.  Severely agitated overnight.  Vancomycin stopped.  Azithromycin stopped.  Interim History / Subjective:  Remain off Precedex since yesterday afternoon Mental status is much improved after she slept overnight for few hours  Objective   Blood pressure (!) 148/81, pulse (!) 144, temperature 98.1 F (36.7 C), temperature source Axillary, resp. rate (!) 32, height 5' (1.524 m), weight 67 kg, SpO2 93 %.        Intake/Output Summary (Last 24 hours) at 01/09/2022 0957 Last data filed at 01/09/2022 0800 Gross per 24 hour  Intake 997.62 ml  Output 550 ml  Net 447.62 ml   Filed Weights   01/06/22 0500 01/07/22 0404 01/08/22 0500  Weight: 69.7 kg 66.7 kg 67 kg    Examination: Physical exam: General: Acute on chronically ill-appearing female, lying on the bed HEENT: [place]/AT, eyes anicteric.  Dry mucus membranes. Cortrak in place Neuro: Alert, awake, following commands Chest: Coarse breath sounds, no wheezes or rhonchi Heart: Irregularly irregular,  tachycardic, no murmurs or gallops Abdomen: Soft, nontender, nondistended, bowel sounds present Skin: No rash   Resolved Hospital Problem list   hypomagnesemia/hypophosphatemia/hyponatremia  Assessment & Plan:  In hospital cardiac arrest, PEA arrest, in the setting of hypoxemia and hypokalemia. 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  Basal LV segments demonstrate normal contractility.  Findings most consistent with Takotsubo cardiomyopathy.  Moderate mitral valve regurgitation.  Mild to moderate tricuspid valve regurgitation. Troponin 131 > 55.  CVP 8 on 8/3 Continue supportive care Patient remained tachycardic with heart rate ranging 130s to 140s Increase metoprolol to 50 mg every 8 hours Continue Xarelto for stroke prophylaxis Appreciate cardiology follow-up Closely monitor electrolytes  Right lower lobe pneumonia Acute respiratory failure with hypoxia, secondary to pneumonia- improving Right parapneumonic pleural effusion, status post chest tube placement  Patient remained on 2 L nasal cannula oxygen, continue to titrate with O2 sat goal 92%  Completed antibiotic therapy for 7 days Continue Pulmicort Brovana and Yupelri, PRN albuterol  ICU delirium Patient mental status has much improved She is alert awake and following commands Remain off Precedex since yesterday afternoon Continue Seroquel Continue melatonin as needed nightly  Hypokalemia Continue aggressive electrolyte supplement  Best Practice (right click and "Reselect all SmartList Selections" daily)   Diet/type: Tube feed DVT prophylaxis: DOAC GI prophylaxis: PPI Lines: Discontinued today Foley: N/A Code Status:  full code Last date of multidisciplinary goals of care discussion [husband updated at bedside on 8/2]  Jacky Kindle, MD [facility] See Amion for pager If no  response to pager, please call [phone] until 7pm After 7pm, Please call E-link [phone]

## 2022-01-09 NOTE — Progress Notes (Signed)
[facility] ADULT ICU REPLACEMENT PROTOCOL   The patient does apply for the [facility] Adult ICU Electrolyte Replacment Protocol based on the criteria listed below:   1.Exclusion criteria: TCTS patients, ECMO patients, and Dialysis patients 2. Is GFR >/= 30 ml/min? Yes.    Patient's GFR today is >60 3. Is SCr </= 2? Yes.   Patient's SCr is 0.60 mg/dL 4. Did SCr increase >/= 0.5 in 24 hours? No. 5.Pt's weight >40kg  Yes.   6. Abnormal electrolyte(s): k 3.4  7. Electrolytes replaced per protocol 8.  Call MD STAT for K+ </= 2.5, Phos </= 1, or Mag </= 1 Physician:    Ronda Fairly A 01/09/2022 6:16 AM

## 2022-01-09 NOTE — Progress Notes (Signed)
Progress Note  Patient Name: [name] Date of Encounter: 01/09/2022  Primary Cardiologist:   Kirk Ruths, MD   Subjective   Much much less confused.  Complains of abdominal fullness.  SOB  Inpatient Medications    Scheduled Meds:  (feeding supplement) PROSource Plus  30 mL Oral Daily   arformoterol  15 mcg Nebulization BID   budesonide (PULMICORT) nebulizer solution  0.25 mg Nebulization BID   docusate  100 mg Per Tube Daily   free water  50 mL Per Tube Q4H   insulin aspart  0-9 Units Subcutaneous Q4H   metoprolol tartrate  50 mg Per Tube Q8H   midazolam  2 mg Intravenous Once   mouth rinse  15 mL Mouth Rinse 4 times per day   polyethylene glycol  17 g Per Tube Daily   QUEtiapine  25 mg Per Tube BID   revefenacin  175 mcg Nebulization Daily   rivaroxaban  20 mg Per Tube Q supper   sennosides  10 mL Oral Daily   Continuous Infusions:  feeding supplement (OSMOLITE 1.2 CAL) 50 mL/hr at 01/09/22 0600   PRN Meds: acetaminophen, acetaminophen, albuterol, melatonin, metoprolol tartrate, mouth rinse, polyethylene glycol   Vital Signs    Vitals:   01/09/22 0700 01/09/22 0800 01/09/22 0900 01/09/22 1100  BP: 134/81 (!) 140/91 (!) 148/81   Pulse: (!) 131 (!) 147 (!) 144   Resp: (!) 30 (!) 27 (!) 32   Temp:    99.6 F (37.6 C)  TempSrc:    Axillary  SpO2: 96% 93% 93%   Weight:      Height:        Intake/Output Summary (Last 24 hours) at 01/09/2022 1151 Last data filed at 01/09/2022 0800 Gross per 24 hour  Intake 880.4 ml  Output 550 ml  Net 330.4 ml   Filed Weights   01/06/22 0500 01/07/22 0404 01/08/22 0500  Weight: 69.7 kg 66.7 kg 67 kg    Telemetry    Atrial fib with rapid ventricular rate- Personally Reviewed  ECG    NA - Personally Reviewed  Physical Exam   GEN:     Ill appearing but in no acute distress.   Neck: No  JVD Cardiac: IrregularRR, no murmurs, rubs, or gallops. , distant heart sounds Respiratory:      Decreased breath sounds  throughout GI: Soft, nontender, distended, normal bowel sounds  MS:  Mod edema; No deformity. Neuro:   Nonfocal  Psych: Oriented and appropriate     Labs    Chemistry Recent Labs  Lab 01/04/22 0152 01/04/22 0513 01/04/22 1808 01/05/22 0105 01/06/22 0432 01/06/22 0447 01/07/22 0408 01/08/22 0430 01/09/22 0425  NA 127*   < > 129* 130*   < > 132* 134* 136 138  K 4.2   < > 3.4* 3.3*   < > 3.5 3.4* 3.7 3.4*  CL 90*   < > 93* 96*  --  100 100 101 100  CO2 25   < > 25 24  --  '25 27 27 27  '$ GLUCOSE 151*   < > 143* 192*  --  113* 106* 137* 172*  BUN 15   < > 15 15  --  22 23 26* 30*  CREATININE 0.73   < > 0.67 0.66  --  0.71 0.66 0.65 0.60  CALCIUM 9.0   < > 9.1 9.0  --  8.9 8.8* 9.2 9.5  PROT 5.9*  --  5.6* 5.3*  --  5.8*  --   --   --   ALBUMIN 2.4*  --   --  2.1*  --  2.3*  --   --   --   AST 69*  --   --  25  --  31  --   --   --   ALT 26  --   --  21  --  22  --   --   --   ALKPHOS 64  --   --  52  --  77  --   --   --   BILITOT 2.1*  --   --  1.3*  --  1.1  --   --   --   GFRNONAA >60   < > >60 >60  --  >60 >60 >60 >60  ANIONGAP 12   < > 11 10  --  '7 7 8 11   '$ < > = values in this interval not displayed.     Hematology Recent Labs  Lab 01/06/22 0447 01/07/22 0408 01/08/22 0430  WBC 17.6* 12.3* 16.8*  RBC 3.04* 3.01* 3.05*  HGB 11.1* 11.1* 11.1*  HCT 32.4* 31.2* 32.1*  MCV 106.6* 103.7* 105.2*  MCH 36.5* 36.9* 36.4*  MCHC 34.3 35.6 34.6  RDW 13.3 13.0 13.3  PLT 156 132* 171    Cardiac EnzymesNo results for input(s): "TROPONINI" in the last 168 hours. No results for input(s): "TROPIPOC" in the last 168 hours.   BNP No results for input(s): "BNP", "PROBNP" in the last 168 hours.    DDimer  Recent Labs  Lab 01/02/22 1308  DDIMER 1.11*     Radiology    DG Abd Portable 1V  Result Date: 01/08/2022 CLINICAL DATA:  Encounter for NG tube. EXAM: PORTABLE ABDOMEN - 1 VIEW COMPARISON:  Radiographs earlier the same date.  CT 01/03/2022. FINDINGS: 1523 hours.  Feeding tube projects over the right upper quadrant of the abdomen, likely in the distal stomach or proximal duodenum. Central venous catheter projects over the mid right atrium. The bowel gas pattern remains nonobstructive. Unchanged right basilar airspace disease. IMPRESSION: Unchanged position of the feeding tube in the right upper quadrant, likely in the distal stomach or proximal duodenum. Electronically Signed   By: Richardean Sale M.D.   On: 01/08/2022 15:29   DG Abd Portable 1V  Result Date: 01/08/2022 CLINICAL DATA:  Provided history: Cortrak feeding tube placement. EXAM: PORTABLE ABDOMEN - 1 VIEW COMPARISON:  CT chest/abdomen/pelvis 01/03/2022. FINDINGS: Central venous catheter with tip terminating at the level of the superior cavoatrial junction. An enteric tube is present with tip terminating in the expected location of the distal stomach/proximal duodenum. Nonobstructive bowel gas pattern. IMPRESSION: Enteric tube present with tip terminating in the expected location of the distal stomach/proximal duodenum. Electronically Signed   By: Kellie Simmering D.O.   On: 01/08/2022 10:35    Cardiac Studies   Echo:  See above.   Patient Profile     81 y.o. female with a hx of atrial fib on propranolol 80 mg qd and Xarelto, HTN, asthma, breast CA, anxiety, who is being seen 01/02/2022 for the evaluation of CHF & rapid atrial fib at the request of Dr Marthenia Rolling. 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    Assessment & Plan    Atrial fib with RVR:      Rate elevated .  Agree with increased beta blocker.  Now on DOAC via tube.    AI/MR:  Noted on prelim  echo review.  Mild/Mod AI likely with at least moderate MR.  (MR worse than previous likely functional.)   Follow clinically.    Acute systolic HF:   EF markedly reduced with possible Takotsubo.   Net negative 1.4.  I will start Cozaar today.      For questions or updates, please contact [place] Please consult [email or web address] for contact  info under Cardiology/STEMI.   Signed, Minus Breeding, MD  01/09/2022, 11:51 AM

## 2022-01-09 NOTE — Plan of Care (Signed)
  Problem: Safety: Goal: Non-violent Restraint(s) Outcome: Completed/Met

## 2022-01-09 NOTE — Progress Notes (Signed)
Speech Language Pathology Treatment: Dysphagia  Patient Details Name: [name] MRN: [id number] DOB: [birth year] Today's Date: 01/09/2022 Time: 1010-1018 SLP Time Calculation (min) (ACUTE ONLY): 8 min  Assessment / Plan / Recommendation Clinical Impression  Pt with improvements in mentation and swallowing this date. Assessed with thins, mixed consistencies, and solid PO. No overt s/sx of aspiration evidenced. Pt did however require pacing, slower rate, as she clinically exhibits fatigue and lower endurance. Will advance diet to regular, thin liquids with safe swallow precautions. SLP to follow up for diet tolerance.    HPI HPI: Patient is a 81 y.o. female with PMH: afib on Xaralto, asthma, GERD, essential HTN, chronic diastolic CHF, right breast cancer, h/o syncope, generalized anxiety disorder, moderate pulmonary  hypertension. She presented to [facility] on 12/06/2021 with SOB, productive cough and right sided chest pain for three days prior to admission. On EMS arrival, patient was hypoxic on RA at 85% and was placed on NRB. In ED she had a Tmax of 103. CXR showed right-sided PNA. On 7/30, rapid respose was called due to patient found unresponsive in hospital bed. CPR started and ROSC achieved after 7 minutes. Patient was intubated by CRNA and transferred to [facility]. On 8/2 she was extubated to supplemental oxygen via [place]      SLP Plan  Continue with current plan of care      Recommendations for follow up therapy are one component of a multi-disciplinary discharge planning process, led by the attending physician.  Recommendations may be updated based on patient status, additional functional criteria and insurance authorization.    Recommendations  Diet recommendations: Regular;Thin liquid Liquids provided via: Cup;Straw Medication Administration: Whole meds with liquid Supervision: Patient able to self feed;Full supervision/cueing for compensatory strategies Compensations: Slow rate;Small  sips/bites;Minimize environmental distractions Postural Changes and/or Swallow Maneuvers: Seated upright 90 degrees;Upright 30-60 min after meal                Oral Care Recommendations: Oral care BID Follow Up Recommendations:  (TBD) Assistance recommended at discharge: Frequent or constant Supervision/Assistance SLP Visit Diagnosis: Dysphagia, unspecified (R13.10) Plan: Continue with current plan of care           Hayden Rasmussen MA, CCC-SLP Acute Rehabilitation Services    01/09/2022, 11:07 AM

## 2022-01-10 ENCOUNTER — Inpatient Hospital Stay (HOSPITAL_COMMUNITY): Payer: Medicare Other

## 2022-01-10 DIAGNOSIS — I469 Cardiac arrest, cause unspecified: Secondary | ICD-10-CM | POA: Diagnosis not present

## 2022-01-10 DIAGNOSIS — J9601 Acute respiratory failure with hypoxia: Secondary | ICD-10-CM | POA: Diagnosis not present

## 2022-01-10 DIAGNOSIS — I5181 Takotsubo syndrome: Secondary | ICD-10-CM | POA: Diagnosis not present

## 2022-01-10 DIAGNOSIS — J189 Pneumonia, unspecified organism: Secondary | ICD-10-CM | POA: Diagnosis not present

## 2022-01-10 DIAGNOSIS — K567 Ileus, unspecified: Secondary | ICD-10-CM

## 2022-01-10 LAB — CBC WITH DIFFERENTIAL/PLATELET
Abs Immature Granulocytes: 0 10*3/uL (ref 0.00–0.07)
Basophils Absolute: 0 10*3/uL (ref 0.0–0.1)
Basophils Relative: 0 %
Eosinophils Absolute: 0 10*3/uL (ref 0.0–0.5)
Eosinophils Relative: 0 %
HCT: 39.2 % (ref 36.0–46.0)
Hemoglobin: 13.3 g/dL (ref 12.0–15.0)
Lymphocytes Relative: 4 %
Lymphs Abs: 1.3 10*3/uL (ref 0.7–4.0)
MCH: 36.2 pg — ABNORMAL HIGH (ref 26.0–34.0)
MCHC: 33.9 g/dL (ref 30.0–36.0)
MCV: 106.8 fL — ABNORMAL HIGH (ref 80.0–100.0)
Monocytes Absolute: 1 10*3/uL (ref 0.1–1.0)
Monocytes Relative: 3 %
Neutro Abs: 30.9 10*3/uL — ABNORMAL HIGH (ref 1.7–7.7)
Neutrophils Relative %: 93 %
Platelets: 246 10*3/uL (ref 150–400)
RBC: 3.67 MIL/uL — ABNORMAL LOW (ref 3.87–5.11)
RDW: 13.5 % (ref 11.5–15.5)
WBC: 33.2 10*3/uL — ABNORMAL HIGH (ref 4.0–10.5)
nRBC: 0 /100 WBC
nRBC: 0.1 % (ref 0.0–0.2)

## 2022-01-10 LAB — GLUCOSE, CAPILLARY
Glucose-Capillary: 104 mg/dL — ABNORMAL HIGH (ref 70–99)
Glucose-Capillary: 108 mg/dL — ABNORMAL HIGH (ref 70–99)
Glucose-Capillary: 129 mg/dL — ABNORMAL HIGH (ref 70–99)
Glucose-Capillary: 189 mg/dL — ABNORMAL HIGH (ref 70–99)
Glucose-Capillary: 67 mg/dL — ABNORMAL LOW (ref 70–99)
Glucose-Capillary: 78 mg/dL (ref 70–99)
Glucose-Capillary: 89 mg/dL (ref 70–99)

## 2022-01-10 LAB — BASIC METABOLIC PANEL
Anion gap: 6 (ref 5–15)
BUN: 22 mg/dL (ref 8–23)
CO2: 26 mmol/L (ref 22–32)
Calcium: 9.7 mg/dL (ref 8.9–10.3)
Chloride: 105 mmol/L (ref 98–111)
Creatinine, Ser: 0.63 mg/dL (ref 0.44–1.00)
GFR, Estimated: >60 mL/min (ref >60)
Glucose, Bld: 134 mg/dL — ABNORMAL HIGH (ref 70–99)
Potassium: 4.6 mmol/L (ref 3.5–5.1)
Sodium: 137 mmol/L (ref 135–145)

## 2022-01-10 LAB — LACTIC ACID, PLASMA
Lactic Acid, Venous: 2.8 mmol/L (ref 0.5–1.9)
Lactic Acid, Venous: 1.6 mmol/L (ref 0.5–1.9)

## 2022-01-10 LAB — MAGNESIUM: Magnesium: 2 mg/dL (ref 1.7–2.4)

## 2022-01-10 MED ORDER — METOPROLOL TARTRATE 5 MG/5ML IV SOLN
5.0000 mg | Freq: Four times a day (QID) | INTRAVENOUS | Status: DC
Start: 2022-01-10 — End: 2022-01-11
  Administered 2022-01-10 – 2022-01-11 (×3): 5 mg via INTRAVENOUS
  Filled 2022-01-10 (×3): qty 5

## 2022-01-10 MED ORDER — IOHEXOL 300 MG/ML  SOLN
80.0000 mL | Freq: Once | INTRAMUSCULAR | Status: AC | PRN
  Administered 2022-01-10: 80 mL via INTRAVENOUS

## 2022-01-10 MED ORDER — METOPROLOL TARTRATE 50 MG PO TABS
50.0000 mg | ORAL_TABLET | Freq: Four times a day (QID) | ORAL | Status: DC
Start: 2022-01-10 — End: 2022-01-10
  Administered 2022-01-10: 50 mg
  Filled 2022-01-10: qty 1

## 2022-01-10 MED ORDER — PIPERACILLIN-TAZOBACTAM 3.375 G IVPB
3.3750 g | Freq: Three times a day (TID) | INTRAVENOUS | Status: AC
  Administered 2022-01-10 – 2022-01-18 (×26): 3.375 g via INTRAVENOUS
  Filled 2022-01-10 (×27): qty 50

## 2022-01-10 MED ORDER — SODIUM CHLORIDE 0.9 % IV SOLN: INTRAVENOUS | Status: DC

## 2022-01-10 MED ORDER — LEVALBUTEROL HCL 0.63 MG/3ML IN NEBU
0.6300 mg | INHALATION_SOLUTION | RESPIRATORY_TRACT | Status: DC | PRN
  Administered 2022-01-14 – 2022-02-08 (×22): 0.63 mg via RESPIRATORY_TRACT
  Filled 2022-01-10 (×22): qty 3

## 2022-01-10 MED ORDER — DEXTROSE 50 % IV SOLN
INTRAVENOUS | Status: AC
  Administered 2022-01-10: 50 mL
  Filled 2022-01-10: qty 50

## 2022-01-10 MED ORDER — LACTATED RINGERS IV SOLN: INTRAVENOUS | Status: DC

## 2022-01-10 MED ORDER — DEXTROSE 50 % IV SOLN
50.0000 mL | Freq: Once | INTRAVENOUS | Status: DC
Start: 2022-01-10 — End: 2022-01-22

## 2022-01-10 MED ORDER — METOPROLOL TARTRATE 5 MG/5ML IV SOLN
2.5000 mg | Freq: Once | INTRAVENOUS | Status: AC
  Administered 2022-01-10: 2.5 mg via INTRAVENOUS
  Filled 2022-01-10: qty 5

## 2022-01-10 MED ORDER — IOHEXOL 9 MG/ML PO SOLN
ORAL | Status: AC
  Administered 2022-01-10: 500 mL
  Filled 2022-01-10: qty 1000

## 2022-01-10 MED ORDER — HEPARIN (PORCINE) 25000 UT/250ML-% IV SOLN
900.0000 [IU]/h | INTRAVENOUS | Status: DC
  Administered 2022-01-10: 900 [IU]/h via INTRAVENOUS
  Filled 2022-01-10: qty 250

## 2022-01-10 NOTE — Significant Event (Signed)
Rapid Response Event Note   Reason for Call :  Red MEWS, Afib RVR.   Initial Focused Assessment:  On arrival, pt sitting edge of bed. A&O, c/o not getting sleep last night d/t "people messing with me all night". HR 121- afib, BP 106/93, RR 21, spO2 94% on 3L [place].    Interventions:  Md paged PTA Metoprolol given for HR control O2 placed for breathing.  Pt made NPO prior in the shift for possible ileus.   Plan of Care:  Continue to monitor pt vitals.  RN instructed to call with any changes or concerns.   Event Summary:   MD Notified: Dr Alcario Drought PTA Call Time: 1505 [place] Time: 6979 End Time: 4801  Sherilyn Dacosta, RN

## 2022-01-10 NOTE — Consult Note (Signed)
Reason for Consult ab distention Referring Physician: Dr [name] is an 81 y.o. female.  HPI: 15 yom admitted on 7/28 for cardiac arrest. She has pmh of afib on xarelto and was admitted with pna.  She has improved on abx.  She had chest tube that has been removed. More recently has some abdominal distention it appears. She was transferred to 2c today.  Overnight she had some ab pain/fullness per note. She had a kub that showed possible ileus.  Today she underwent a ct scan c/w ischemic colitis.  She is not complaining of ab pain. She has no n/v.  She states she has had a bm.  I was asked to see her.   Past Medical History:  Diagnosis Date   Anemia    history of   Anxiety    Arthritis    Asthma    Breast cancer ([place])    Cancer ([place])    skin cancers, one melanoma    Complication of anesthesia    patient woke up during colonoscopy   Concussion    8 yrs. 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    Keratosis, actinic    Melanoma ([place])    Persistent atrial fibrillation ([place]) 12/16/2016   CHA2DS2VASC score 3   Pneumonia    hx of     Past Surgical History:  Procedure Laterality Date   brachial skin removal due to deforimity Bilateral    BREAST ENHANCEMENT SURGERY Left 11/14/2018   Procedure: LEFT BREAST AUGMENTATION;  Surgeon: [name], MD;  Location: [place];  Service: Plastics;  Laterality: Left;   BREAST RECONSTRUCTION Right 06/26/2018   BREAST RECONSTRUCTION WITH PLACEMENT OF TISSUE EXPANDER AND ALLODERM Right 06/26/2018   Procedure: RIGHT BREAST RECONSTRUCTION WITH PLACEMENT OF TISSUE EXPANDER;  Surgeon: [name], MD;  Location: [place];  Service: Plastics;  Laterality: Right;   BREAST RECONSTRUCTION WITH PLACEMENT OF TISSUE EXPANDER AND FLEX HD (ACELLULAR HYDRATED DERMIS) Right 01/28/2017    RIGHT BREAST RECONSTRUCTION WITH PLACEMENT OF TISSUE EXPANDER AND ALLODERM;  Surgeon: [name], MD;  Location: [place];  Service: Plastics;  Laterality: Right;   BUNIONECTOMY     left    CATARACT EXTRACTION     [place] cataract surgery    COLONOSCOPY W/ POLYPECTOMY     JOINT REPLACEMENT     KNEE ARTHROSCOPY     left    LATISSIMUS FLAP TO BREAST Right 06/26/2018   Procedure: LATISSIMUS FLAP TO RIGHT CHEST;  Surgeon: [name], MD;  Location: [place];  Service: Plastics;  Laterality: Right;   MASTECTOMY Right    MASTECTOMY W/ SENTINEL NODE BIOPSY Right 01/28/2017   Procedure: RIGHT TOTAL MASTECTOMY WITH SENTINEL LYMPH NODE BIOPSY;  Surgeon: Excell Seltzer, MD;  Location: [place];  Service: General;  Laterality: Right;   MASTOPEXY Left 11/14/2018   Procedure: LEFT BREAST MASTOPEXY;  Surgeon: [name], MD;  Location: [place];  Service: Plastics;  Laterality: Left;   MELANOMA EXCISION Left    polyp removed from uterus      REMOVAL OF TISSUE EXPANDER  AND PLACEMENT OF IMPLANT Right 11/14/2018   Procedure: REMOVAL OF RIGHT TISSUE EXPANDER AND PLACEMENT OF SALINE IMPLANT;  Surgeon: [name], MD;  Location: [place];  Service: Plastics;  Laterality: Right;   right wrist pinning      skin cancers removed     TISSUE EXPANDER PLACEMENT Right 02/27/2018   Procedure: REMOVAL OF TISSUE EXPANDER;  Surgeon: [name], MD;  Location: [place];  Service: Plastics;  Laterality: Right;   TOTAL KNEE ARTHROPLASTY Right 01/29/2013   Procedure: RIGHT TOTAL KNEE ARTHROPLASTY;  Surgeon: Gearlean Alf, MD;  Location: [facility];  Service: Orthopedics;  Laterality: Right;   TOTAL KNEE ARTHROPLASTY Left 08/29/2017   Procedure: LEFT TOTAL KNEE ARTHROPLASTY;  Surgeon:  Gaynelle Arabian, MD;  Location: [facility];  Service: Orthopedics;  Laterality: Left;    Family History  Problem Relation Age of Onset   Hypertension Other    COPD Father    Breast cancer Other        dx in her 88s-30s    Social History:  reports that she has never smoked. She has never used smokeless tobacco. She reports current alcohol use of about 1.0 standard drink of alcohol per week. She reports that she does not use drugs.  Allergies:  Allergies  Allergen Reactions   Chlorhexidine Gluconate Hives, Swelling and Rash       "ChloraPrep "   Doxycycline Nausea And Vomiting   Codeine Nausea Only and Other (See Comments)    Pt can tolerate when pre-medicated   Doxycycline Nausea And Vomiting   Nsaids     avoid due to gerd   Nsaids Other (See Comments)    Gi bleed   Tylenol [Acetaminophen]     Avoid due to GERD   Adhesive [Tape] Rash    Dermabond   Chlorhexidine Swelling and Rash   Codeine Nausea Only   Tylenol [Acetaminophen] Other (See Comments)    GI bleed    Medications: I have reviewed the patient's current medications.  Results for orders placed or performed during the hospital encounter of 12/14/2021 (from the past 48 hour(s))  Glucose, capillary     Status: Abnormal   Collection Time: 01/08/22  4:36 PM  Result Value Ref Range   Glucose-Capillary 163 (H) 70 - 99 mg/dL    Comment: Glucose reference range applies only to samples taken after fasting for at least 8 hours.  Glucose, capillary     Status: Abnormal   Collection Time: 01/08/22  7:37 PM  Result Value Ref Range   Glucose-Capillary 100 (H) 70 - 99 mg/dL    Comment: Glucose reference range applies only to samples taken after fasting for at least 8 hours.  Glucose, capillary     Status: None   Collection Time: 01/08/22 11:20 PM  Result Value Ref Range   Glucose-Capillary 74 70 - 99 mg/dL    Comment: Glucose reference range applies only to samples taken after fasting for at least 8 hours.  Basic metabolic  panel     Status: Abnormal   Collection Time: 01/09/22  4:25 AM  Result Value Ref Range   Sodium 138 135 - 145 mmol/L   Potassium 3.4 (L) 3.5 - 5.1 mmol/L   Chloride 100 98 - 111 mmol/L   CO2 27 22 - 32 mmol/L   Glucose, Bld 172 (H) 70 - 99 mg/dL    Comment: Glucose reference range applies only to samples taken after fasting for at least 8 hours.  BUN 30 (H) 8 - 23 mg/dL   Creatinine, Ser 0.60 0.44 - 1.00 mg/dL   Calcium 9.5 8.9 - 10.3 mg/dL   GFR, Estimated >60 >60 mL/min    Comment: (NOTE) Calculated using the CKD-EPI Creatinine Equation (2021)    Anion gap 11 5 - 15    Comment: Performed at [facility] [address] [place], [place] [address]  Magnesium     Status: None   Collection Time: 01/09/22  4:25 AM  Result Value Ref Range   Magnesium 2.0 1.7 - 2.4 mg/dL    Comment: Performed at [facility] [address] [place], [place] [address]  Glucose, capillary     Status: Abnormal   Collection Time: 01/09/22  4:36 AM  Result Value Ref Range   Glucose-Capillary 160 (H) 70 - 99 mg/dL    Comment: Glucose reference range applies only to samples taken after fasting for at least 8 hours.  Glucose, capillary     Status: Abnormal   Collection Time: 01/09/22  6:18 AM  Result Value Ref Range   Glucose-Capillary 110 (H) 70 - 99 mg/dL    Comment: Glucose reference range applies only to samples taken after fasting for at least 8 hours.  Glucose, capillary     Status: Abnormal   Collection Time: 01/09/22  7:36 AM  Result Value Ref Range   Glucose-Capillary 125 (H) 70 - 99 mg/dL    Comment: Glucose reference range applies only to samples taken after fasting for at least 8 hours.  Glucose, capillary     Status: Abnormal   Collection Time: 01/09/22 11:16 AM  Result Value Ref Range   Glucose-Capillary 160 (H) 70 - 99 mg/dL    Comment: Glucose reference range applies only to samples taken after fasting for at least 8 hours.  Glucose, capillary     Status: None    Collection Time: 01/09/22  4:03 PM  Result Value Ref Range   Glucose-Capillary 79 70 - 99 mg/dL    Comment: Glucose reference range applies only to samples taken after fasting for at least 8 hours.  Glucose, capillary     Status: Abnormal   Collection Time: 01/09/22  9:15 PM  Result Value Ref Range   Glucose-Capillary 110 (H) 70 - 99 mg/dL    Comment: Glucose reference range applies only to samples taken after fasting for at least 8 hours.  Basic metabolic panel     Status: Abnormal   Collection Time: 01/10/22 12:46 AM  Result Value Ref Range   Sodium 137 135 - 145 mmol/L   Potassium 4.6 3.5 - 5.1 mmol/L    Comment: DELTA CHECK NOTED   Chloride 105 98 - 111 mmol/L   CO2 26 22 - 32 mmol/L   Glucose, Bld 134 (H) 70 - 99 mg/dL    Comment: Glucose reference range applies only to samples taken after fasting for at least 8 hours.   BUN 22 8 - 23 mg/dL   Creatinine, Ser 0.63 0.44 - 1.00 mg/dL   Calcium 9.7 8.9 - 10.3 mg/dL   GFR, Estimated >60 >60 mL/min    Comment: (NOTE) Calculated using the CKD-EPI Creatinine Equation (2021)    Anion gap 6 5 - 15    Comment: Performed at [facility] [address], [place],  [address]  Magnesium     Status: None   Collection Time: 01/10/22 12:46 AM  Result Value Ref Range   Magnesium 2.0 1.7 -  2.4 mg/dL    Comment: Performed at [facility], [place] [address], [place], [place] [address]  Glucose, capillary     Status: Abnormal   Collection Time: 01/10/22 12:48 AM  Result Value Ref Range   Glucose-Capillary 108 (H) 70 - 99 mg/dL    Comment: Glucose reference range applies only to samples taken after fasting for at least 8 hours.  Glucose, capillary     Status: Abnormal   Collection Time: 01/10/22  4:09 AM  Result Value Ref Range   Glucose-Capillary 129 (H) 70 - 99 mg/dL    Comment: Glucose reference range applies only to samples taken after fasting for at least 8 hours.  Glucose, capillary     Status: None   Collection Time:  01/10/22  9:15 AM  Result Value Ref Range   Glucose-Capillary 78 70 - 99 mg/dL    Comment: Glucose reference range applies only to samples taken after fasting for at least 8 hours.  CBC with Differential/Platelet     Status: Abnormal   Collection Time: 01/10/22 10:06 AM  Result Value Ref Range   WBC 33.2 (H) 4.0 - 10.5 K/uL   RBC 3.67 (L) 3.87 - 5.11 MIL/uL   Hemoglobin 13.3 12.0 - 15.0 g/dL   HCT 39.2 36.0 - 46.0 %   MCV 106.8 (H) 80.0 - 100.0 fL   MCH 36.2 (H) 26.0 - 34.0 pg   MCHC 33.9 30.0 - 36.0 g/dL   RDW 13.5 11.5 - 15.5 %   Platelets 246 150 - 400 K/uL   nRBC 0.1 0.0 - 0.2 %   Neutrophils Relative % 93 %   Neutro Abs 30.9 (H) 1.7 - 7.7 K/uL   Lymphocytes Relative 4 %   Lymphs Abs 1.3 0.7 - 4.0 K/uL   Monocytes Relative 3 %   Monocytes Absolute 1.0 0.1 - 1.0 K/uL   Eosinophils Relative 0 %   Eosinophils Absolute 0.0 0.0 - 0.5 K/uL   Basophils Relative 0 %   Basophils Absolute 0.0 0.0 - 0.1 K/uL   nRBC 0 0 /100 WBC   Abs Immature Granulocytes 0.00 0.00 - 0.07 K/uL    Comment: Performed at [facility], [address], [place], [place] [address]  Glucose, capillary     Status: Abnormal   Collection Time: 01/10/22 12:36 PM  Result Value Ref Range   Glucose-Capillary 67 (L) 70 - 99 mg/dL    Comment: Glucose reference range applies only to samples taken after fasting for at least 8 hours.  Glucose, capillary     Status: Abnormal   Collection Time: 01/10/22  1:02 PM  Result Value Ref Range   Glucose-Capillary 189 (H) 70 - 99 mg/dL    Comment: Glucose reference range applies only to samples taken after fasting for at least 8 hours.    CT ABDOMEN PELVIS W CONTRAST  Result Date: 01/10/2022 CLINICAL DATA:  Bowel obstruction.  Abdominal pain. EXAM: CT ABDOMEN AND PELVIS WITH CONTRAST TECHNIQUE: Multidetector CT imaging of the abdomen and pelvis was performed using the standard protocol following bolus administration of intravenous contrast. RADIATION DOSE REDUCTION: This  exam was performed according to the departmental dose-optimization program which includes automated exposure control, adjustment of the mA and/or kV according to patient size and/or use of iterative reconstruction technique. CONTRAST:  56m OMNIPAQUE IOHEXOL 300 MG/ML  SOLN COMPARISON:  Chest abdomen pelvis CT 01/03/2022 FINDINGS: Lower chest: Collapse/consolidation in the right lower lung persists but is improved since prior study. Small  right pleural effusion noted. The cardio pericardial silhouette is enlarged. Hepatobiliary: No suspicious focal abnormality within the liver parenchyma. There is no evidence for gallstones, gallbladder wall thickening, or pericholecystic fluid. No intrahepatic or extrahepatic biliary dilation. Pancreas: No focal mass lesion. No dilatation of the main duct. No intraparenchymal cyst. No peripancreatic edema. Spleen: No splenomegaly. No focal mass lesion. Adrenals/Urinary Tract: No adrenal nodule or mass. Cortical scarring noted in both kidneys. No hydronephrosis tiny hypodensities in both kidneys are too small to characterize but likely benign. Stomach/Bowel: Stomach is markedly distended with fluid. Feeding tube tip is in the antral region adjacent to the pylorus. Duodenum is normally positioned as is the ligament of Treitz. Small bowel loops are fluid-filled and distended up to 3.4 cm diameter. Small bowel loops in the right lower quadrant a completely decompressed. The terminal ileum is not discretely visible. Cecal tip is in the lateral right lower quadrant on the previous study and is now in the midline abdomen just deep to the umbilicus (image 76/7. There is pericolonic edema/inflammation associated with the cecum and ascending colon. Pneumatosis is noted in the cecum and ascending colon. No wall thickening or pneumatosis in the hepatic flexure, transverse colon, or left colon. Diffuse colonic diverticulosis evident. Vascular/Lymphatic: There is moderate atherosclerotic  calcification of the abdominal aorta without aneurysm. Portal vein and superior mesenteric vein are patent. No evidence for portal venous gas. Celiac axis, SMA, and IMA are opacified. There is no gastrohepatic or hepatoduodenal ligament lymphadenopathy. No retroperitoneal or mesenteric lymphadenopathy. No pelvic sidewall lymphadenopathy. Reproductive: Small posterior uterine fibroid evident. There is no adnexal mass. Other: Small volume free fluid is seen in the cul-de-sac in the para colic gutters bilaterally. Musculoskeletal: No worrisome lytic or sclerotic osseous abnormality. Body wall edema noted lower abdomen and pelvis. IMPRESSION: 1. Interval development of apparent pneumatosis involving the cecum and ascending colon. Imaging features concerning for ischemic colitis. While the apparent pneumatosis is best appreciated in segments of colon with adjacent intraluminal fluid, given the adjacent pericolonic edema/inflammation, pneumatosis is favored over gas trapped between intraluminal contents and the mucosal surface. Cecum is mobile and while it was in the lower right lower quadrant on the study from 1 week ago, it is now positioned in the midline upper pelvis/abdomen. No luminal narrowing in the cecum or ascending colon to confirm the presence of a cecal volvulus. 2. Small bowel loops in the right lower quadrant are decompressed while proximal and mid small bowel is fluid-filled and distended up to 3.4 cm. Although no discrete transition zone is identified, given the markedly decompressed distal small bowel the right lower quadrant, associated small bowel obstruction is a concern. Inflammatory ileus secondary to the right colonic process would also be a consideration. Right lower quadrant internal hernia not excluded. 3. Marked distention of the stomach with fluid. Tube tip is in the antral region adjacent to the pylorus. 4. Collapse/consolidation in the right lower lung persists but is improved since prior  study. 5. Small volume free fluid in the cul-de-sac and para colic gutters bilaterally. 6. Aortic Atherosclerosis (ICD10-I70.0). These results will be called to the ordering clinician or representative by the Radiologist Assistant, and communication documented in the PACS or [employer]. Electronically Signed   By: Misty Stanley M.D.   On: 01/10/2022 14:02   DG Abd 1 View  Result Date: 01/10/2022 CLINICAL DATA:  Abdominal tenderness with distension. EXAM: ABDOMEN - 1 VIEW COMPARISON:  01/08/2022 FINDINGS: Weighted enteric tube tip in the right upper quadrant in  the region of the distal stomach or proximal duodenum. There is mild gaseous gastric distension. Mild gaseous distention of small and large bowel in the central abdomen. No evidence of free air on this supine view. Surgical clips in the right upper quadrant. IMPRESSION: Mild gaseous gastric distension. Mild gaseous distention of small and large bowel in the central abdomen, favoring ileus. Electronically Signed   By: Keith Rake M.D.   On: 01/10/2022 02:51   DG Abd Portable 1V  Result Date: 01/08/2022 CLINICAL DATA:  Encounter for NG tube. EXAM: PORTABLE ABDOMEN - 1 VIEW COMPARISON:  Radiographs earlier the same date.  CT 01/03/2022. FINDINGS: 1523 hours. Feeding tube projects over the right upper quadrant of the abdomen, likely in the distal stomach or proximal duodenum. Central venous catheter projects over the mid right atrium. The bowel gas pattern remains nonobstructive. Unchanged right basilar airspace disease. IMPRESSION: Unchanged position of the feeding tube in the right upper quadrant, likely in the distal stomach or proximal duodenum. Electronically Signed   By: Richardean Sale M.D.   On: 01/08/2022 15:29    Review of Systems  Respiratory:  Positive for shortness of breath.   Gastrointestinal:  Positive for abdominal distention. Negative for abdominal pain.  All other systems reviewed and are negative.  Blood pressure 109/71,  pulse (!) 118, temperature 97.8 F (36.6 C), temperature source Oral, resp. rate (!) 35, height 5' (1.524 m), weight 67 kg, SpO2 100 %. Physical Exam Constitutional:      Appearance: Normal appearance.  Cardiovascular:     Rate and Rhythm: Rhythm irregular.  Pulmonary:     Effort: Pulmonary effort is normal.  Abdominal:     Comments: Distended abdomen, nontender   Neurological:     Mental Status: She is alert.     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 -recommend ng tube suction, hold xarelto (can use iv heparin), abx and npo including medications for now -will check xray in am -hopefully can avoid surgery as this would carry a fair amount of morbidity  Rolm Bookbinder 01/10/2022, 2:49 PM

## 2022-01-10 NOTE — Progress Notes (Signed)
Pt's MEWS red all the shift, Dr. Bonner Puna aware, HR between 110-130's mostly,  One episode of hypoglycemic event taken care by 50% dextrose IV push, now blood sugar under controlled, MD informed  12 fr. NG tube inserted and low intermittent suction started after confirmed placement with X-ray, pt is draining greenish discharge in a moderate amount.  Cotrak discontinued  Heparin started at 9cc/hr and pt does not have any sign of bleeding  Husband is in bedside and aware of the situation  Will continue to monitor the patient  Palma Holter, Therapist, sports

## 2022-01-10 NOTE — Progress Notes (Signed)
Pharmacy Antibiotic Note  [name] is a 81 y.o. female admitted on 12/30/2021  s/p full course of cefepime for pneumonia, now suspected to have an intra-abdominal infection. PMH of afib on Xarelto, HTN, CHF, ight breast cancer on letrozole, and cardiac arrest this admission.   Patient admitted for R sided pneumonia with 6 days of azithromycin, ceftriaxone, and then 5 days of cefepime.  WBC has increased to 33.2 since stopping cefepime. PT is afebrile. Pt is no longer on steroids and complaining of abdominal symptoms.   Plan:  Start Zosyn 3.375 every 8 hours F/u clinical progress and deesclation  Height: 5' (152.4 cm) Weight: 67 kg (147 lb 11.3 oz) IBW/kg (Calculated) : 45.5  Temp (24hrs), Avg:98.6 F (37 C), Min:97.7 F (36.5 C), Max:100.1 F (37.8 C)  Recent Labs  Lab 01/03/22 1858 01/03/22 2119 01/04/22 0513 01/04/22 1014 01/05/22 0105 01/06/22 0447 01/07/22 0408 01/08/22 0430 01/09/22 0425 01/10/22 0046 01/10/22 1006  WBC  --    < >  --   --  15.7* 17.6* 12.3* 16.8*  --   --  33.2*  CREATININE  --    < > 0.62   < > 0.66 0.71 0.66 0.65 0.60 0.63  --   LATICACIDVEN 4.5*  --  1.9  --   --   --   --   --   --   --   --    < > = values in this interval not displayed.     Estimated Creatinine Clearance: 49.5 mL/min (by C-G formula based on SCr of 0.63 mg/dL).    Allergies  Allergen Reactions   Chlorhexidine Gluconate Hives, Swelling and Rash       "ChloraPrep "   Doxycycline Nausea And Vomiting   Codeine Nausea Only and Other (See Comments)    Pt can tolerate when pre-medicated   Doxycycline Nausea And Vomiting   Nsaids     avoid due to gerd   Nsaids Other (See Comments)    Gi bleed   Tylenol [Acetaminophen]     Avoid due to GERD   Adhesive [Tape] Rash    Dermabond   Chlorhexidine Swelling and Rash   Codeine Nausea Only   Tylenol [Acetaminophen] Other (See Comments)    GI bleed    Antimicrobials this admission: Azithro 7/28 >> 8/2 Ceftriaxone 7/28>>  7/29 Vanc 7/30>> 8/1 Cefepime 7/31>> 8/4 Zosyn 8/6>>  Microbiology results: Pleural fluid 7/31: ngtd Bcx 7/28: ngtd  BAL 7/30: ngtd  Ucx 7/29: ng  MRSA PCR 7/28: neg    Thank you for allowing pharmacy to participate in this patient's care.  Reatha Harps, PharmD PGY2 Pharmacy Resident 01/10/2022 2:26 PM Check [email or web address] for unit specific pharmacy number

## 2022-01-10 NOTE — Progress Notes (Signed)
ANTICOAGULATION CONSULT NOTE   Pharmacy Consult for Heparin Indication: atrial fibrillation  Allergies  Allergen Reactions   Chlorhexidine Gluconate Hives, Swelling and Rash       "ChloraPrep "   Doxycycline Nausea And Vomiting   Codeine Nausea Only and Other (See Comments)    Pt can tolerate when pre-medicated   Doxycycline Nausea And Vomiting   Nsaids     avoid due to gerd   Nsaids Other (See Comments)    Gi bleed   Tylenol [Acetaminophen]     Avoid due to GERD   Adhesive [Tape] Rash    Dermabond   Chlorhexidine Swelling and Rash   Codeine Nausea Only   Tylenol [Acetaminophen] Other (See Comments)    GI bleed    Patient Measurements: Height: 5' (152.4 cm) Weight: 67 kg (147 lb 11.3 oz) IBW/kg (Calculated) : 45.5 Heparin Dosing Weight: 60 kg   Vital Signs: Temp: 97.8 F (36.6 C) (08/06 1234) Temp Source: Oral (08/06 1234) BP: 109/71 (08/06 1234) Pulse Rate: 118 (08/06 1234)  Labs: Recent Labs    01/08/22 0430 01/09/22 0425 01/10/22 0046 01/10/22 1006  HGB 11.1*  --   --  13.3  HCT 32.1*  --   --  39.2  PLT 171  --   --  246  APTT 53*  --   --   --   HEPARINUNFRC 0.59  --   --   --   CREATININE 0.65 0.60 0.63  --      Estimated Creatinine Clearance: 49.5 mL/min (by C-G formula based on SCr of 0.63 mg/dL).   Medical History: Past Medical History:  Diagnosis Date   Anemia    history of   Anxiety    Arthritis    Asthma    Breast cancer ([place])    Cancer ([place])    skin cancers, one melanoma    Complication of anesthesia    patient woke up during colonoscopy   Concussion    8 yrs. 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     Keratosis, actinic    Melanoma (H[facility])    Persistent atrial fibrillation ([facility]) 12/16/2016   CHA2DS2VASC score 3   Pneumonia    hx of     Medications:  Scheduled:   arformoterol  15 mcg Nebulization BID   budesonide (PULMICORT) nebulizer solution  0.25 mg Nebulization BID   dextrose  50 mL Intravenous Once   insulin aspart  0-9 Units Subcutaneous Q4H   losartan  25 mg Oral Daily   metoprolol tartrate  5 mg Intravenous Q6H   mouth rinse  15 mL Mouth Rinse 4 times per day   revefenacin  175 mcg Nebulization Daily    Assessment: 66 yof presenting with acute hypoxemia related to COPD excerbation/PNA complicated by PEA arrest  - on Xarelto PTA for Afib. Patient started on IV heparin 7/31 then transitioned to Xarelto 8/4 (LD 8/5 @ 1612). Patient now resuming IV heparin for possible ischemic colitis. Previously therapeutic on 900 units/hr.    No reported s/sx of bleeding.  Given recent DOAC will monitor both aPTT and heparin  level until correlate.   Goal of Therapy:  Heparin level 0.3-0.7 units/ml aPTT 66-102 seconds Monitor platelets by anticoagulation protocol: Yes   Plan:  Heparin 900 units/hr aPTT in 8 hr F/u daily heparin level and aPTT until correlate  Monitor CBC, and for s/sx of bleeding    Francena Hanly, PharmD Pharmacy Resident  01/10/2022 3:48 PM

## 2022-01-10 NOTE — Progress Notes (Addendum)
Persistently tachy A.Fib RVR with rates 120s-130s.  BPs remain good though (130/84, 121/76).  Going to try another 2.'5mg'$  IV metoprolol x1 to see if we cant get rate under a bit more control.

## 2022-01-10 NOTE — Progress Notes (Signed)
Progress Note  Patient: [name] QQI:[id number] DOB: December 24, 1940  DOA: 12/20/2021  DOS: 01/10/2022    Brief hospital course: [name] is a 81 y.o. female with a history of atrial fibrillation on xarelto, HTN, HFpEF, breast CA on letrozole, pulmonary HTN, GAD who presented to the ED initially on 7/28 for shortness of breath, productive cough. She was found to be hypoxic, febrile with RLL pneumonia. She was admitted, placed on IV antibiotics, concern for possible COPD exacerbation, encephalopathy, AFib with RVR, and sustained PEA arrest on 7/30 requiring 7 minutes of CPR, intubation, subsequently transferred to ICU. Had chest tube placed 7/31 for enlarging right parapneumonic exudative effusion, subsequently removed by patient 8/1. 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 Neurology consulted 8/2 for worsening agitation, routine EEG showing diffuse slowing without epileptiform abnormalities, patient required precedex for agitation off and on. Extubated 8/3. Cardiology has been following, titrating GDMT and metoprolol for RVR. With improvement, care transferred to hospitalist service on 8/6. Overnight the patient has developed worsening lactic acidosis, leukocytosis up to 33k, AFib with rising ventricular rate, abdominal distention with concern for ischemic colitis with SBO. Patient made NPO, cortrak removed, NGT inserted, placed to LIWS, zosyn started, and surgery consulted.   Assessment and Plan: Acute hypoxic respiratory failure due to right sided CAP with parapneumonic effusion:  - Continue nasal cannula supplementation, seems improving.  - Cultures remained no growth including pleural fluid, BAL, and blood cultures. Completed antibiotics directed toward this problem, though leukocytosis had been progressing (was also on solumedrol 7/30 - 8/3).  COPD exacerbation: Seems much improved.  - Continue inhaled bronchodilators, change to levalbuterol given her persistent rate  elevation.  - Attempt to avoid further steroids in setting of ischemic bowel  Atrial fibrillation with RVR: Rate has been climbing for past couple days despite augmentation of metoprolol, likely due to worsening ischemic bowel.  - Cardiology recommendations appreciated, unable to give metoprolol enterally per surgery, so change to metoprolol 52m scheduled and monitor response. Avoid CCB.  - Change xarelto to heparin  Abdominal distention, ischemic colitis, SBO:  - D/w Dr. WDonne Hazel appreciate recommendations.  - Her symptoms and exam are more reassuring than her presentation by labs/CT.  - NG tube inserted, placed to LIWS, made strict NPO.  - Started zosyn - Attempting to avoid surgical intervention at this time.  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   Right breast CA:  - Suggest follow up imaging as pulmonary mass was not excluded on imaging and exudative pleural effusion.  Hypokalemia: Improved, recheck in AM  Subjective: Pt with modest abdominal discomfort but not pain, breathing about stable, no chest pain currently.   Objective: Vitals:   01/10/22 0912 01/10/22 1120 01/10/22 1234 01/10/22 1525  BP: (!) 124/91 107/80 109/71 126/80  Pulse: (!) 126 (!) 133 (!) 118 (!) 110  Resp: (!) 37 (!) 26 (!) 35 (!) 30  Temp: 100.1 F (37.8 C) 97.9 F (36.6 C) 97.8 F (36.6 C) 98 F (36.7 C)  TempSrc: Oral  Oral Oral  SpO2: 98% 98% 100% 99%  Weight:      Height:       Gen: 81y.o. female in no distress Pulm: Nonlabored breathing supplemental oxygen, upper airway wheeze noted without crackles.  CV: Rapid irregular without murmur, rub, or gallop. No JVD, no pitting dependent edema. GI: Abdomen distended but not significantly tender, +BS.  Ext: Warm, no  deformities Skin: No rashes, lesions or ulcers on visualized skin. Neuro: Alert and oriented. No focal neurological deficits. Psych:  Judgement and insight appear fair. Mood euthymic & affect congruent. Behavior is appropriate.    Data Personally reviewed: CBC: Recent Labs  Lab 01/05/22 0105 01/06/22 0432 01/06/22 0447 01/07/22 0408 01/08/22 0430 01/10/22 1006  WBC 15.7*  --  17.6* 12.3* 16.8* 33.2*  NEUTROABS 13.5*  --   --   --   --  30.9*  HGB 11.1* 11.2* 11.1* 11.1* 11.1* 13.3  HCT 31.1* 33.0* 32.4* 31.2* 32.1* 39.2  MCV 101.3*  --  106.6* 103.7* 105.2* 106.8*  PLT 140*  --  156 132* 171 433   Basic Metabolic Panel: Recent Labs  Lab 01/04/22 1808 01/05/22 0105 01/05/22 1632 01/06/22 0432 01/06/22 0447 01/06/22 2205 01/07/22 0408 01/08/22 0430 01/09/22 0425 01/10/22 0046  NA 129* 130*  --    < > 132*  --  134* 136 138 137  K 3.4* 3.3*  --    < > 3.5  --  3.4* 3.7 3.4* 4.6  CL 93* 96*  --   --  100  --  100 101 100 105  CO2 25 24  --   --  25  --  _0 GLUCOSE 143* 192*  --   --  113*  --  106* 137* 172* 134*  BUN 15 15  --   --  22  --  23 26* 30* 22  CREATININE 0.67 0.66  --   --  0.71  --  0.66 0.65 0.60 0.63  CALCIUM 9.1 9.0  --   --  8.9  --  8.8* 9.2 9.5 9.7  MG 1.6* 1.6* 2.5*  --  2.4  --   --  2.2 2.0 2.0  PHOS 2.0* 1.9* 3.4  --  4.3 2.0*  --   --   --   --    < > = values in this interval not displayed.   GFR: Estimated Creatinine Clearance: 49.5 mL/min (by C-G formula based on SCr of 0.63 mg/dL). Liver Function Tests: Recent Labs  Lab 01/03/22 2119 01/04/22 0152 01/04/22 1808 01/05/22 0105 01/06/22 0447  AST 59* 69*  --  25 31  ALT 26 26  --  21 22  ALKPHOS 63 64  --  52 77  BILITOT 2.0* 2.1*  --  1.3* 1.1  PROT 5.5* 5.9* 5.6* 5.3* 5.8*  ALBUMIN 2.3* 2.4*  --  2.1* 2.3*   No results for input(s): "LIPASE", "AMYLASE" in the last 168 hours. No results for input(s): "AMMONIA" in the last 168 hours. Coagulation Profile: Recent Labs  Lab 01/03/22 2119  INR 3.8*   Cardiac Enzymes: No results for input(s): "CKTOTAL", "CKMB", "CKMBINDEX", "TROPONINI" in the last 168  hours. BNP (last 3 results) No results for input(s): "PROBNP" in the last 8760 hours. HbA1C: No results for input(s): "HGBA1C" in the last 72 hours. CBG: Recent Labs  Lab 01/10/22 0409 01/10/22 0915 01/10/22 1236 01/10/22 1302 01/10/22 1557  GLUCAP 129* 78 67* 189* 104*   Lipid Profile: No results for input(s): "CHOL", "HDL", "LDLCALC", "TRIG", "CHOLHDL", "LDLDIRECT" in the last 72 hours. Thyroid Function Tests: No results for input(s): "TSH", "T4TOTAL", "FREET4", "T3FREE", "THYROIDAB" in the last 72 hours. Anemia Panel: No results for input(s): "VITAMINB12", "FOLATE", "FERRITIN", "TIBC", "IRON", "RETICCTPCT" in the last 72 hours. Urine analysis:    Component Value Date/Time   COLORURINE YELLOW 12/28/2020 1641   APPEARANCEUR HAZY (  A) 12/28/2020 1641   LABSPEC 1.009 12/28/2020 1641   PHURINE 6.0 12/28/2020 1641   GLUCOSEU NEGATIVE 12/28/2020 1641   HGBUR NEGATIVE 12/28/2020 [place] 12/28/2020 1641   KETONESUR NEGATIVE 12/28/2020 1641   PROTEINUR NEGATIVE 12/28/2020 1641   UROBILINOGEN 0.2 01/22/2013 1345   NITRITE NEGATIVE 12/28/2020 1641   LEUKOCYTESUR LARGE (A) 12/28/2020 1641   Recent Results (from the past 240 hour(s))  MRSA Next Gen by PCR, Nasal     Status: None   Collection Time: 12/29/2021 10:32 PM  Result Value Ref Range Status   MRSA by PCR Next Gen NOT DETECTED NOT DETECTED Final    Comment: (NOTE) The GeneXpert MRSA Assay (FDA approved for NASAL specimens only), is one component of a comprehensive MRSA colonization surveillance program. It is not intended to diagnose MRSA infection nor to guide or monitor treatment for MRSA infections. Test performance is not FDA approved in patients less than 27 years old. Performed at [facility], [place] [address], [place], [place] [address]   Culture, blood (Routine X 2) w Reflex to ID Panel     Status: None   Collection Time: 12/29/2021 10:49 PM   Specimen: BLOOD LEFT HAND  Result Value Ref  Range Status   Specimen Description BLOOD LEFT HAND  Final   Special Requests   Final    AEROBIC BOTTLE ONLY Blood Culture results may not be optimal due to an inadequate volume of blood received in culture bottles   Culture   Final    NO GROWTH 5 DAYS Performed at [facility], [place] [address] [place], [place] [address]    Report Status 01/06/2022 FINAL  Final  Culture, blood (Routine X 2) w Reflex to ID Panel     Status: None   Collection Time: 12/08/2021 10:49 PM   Specimen: BLOOD LEFT HAND  Result Value Ref Range Status   Specimen Description BLOOD LEFT HAND  Final   Special Requests   Final    BOTTLES DRAWN AEROBIC AND ANAEROBIC Blood Culture adequate volume   Culture   Final    NO GROWTH 5 DAYS Performed at [facility], [place] [address], [place], [place] [address]    Report Status 01/06/2022 FINAL  Final  Urine Culture     Status: None   Collection Time: 01/02/22 10:42 AM   Specimen: Urine, Clean Catch  Result Value Ref Range Status   Specimen Description URINE, CLEAN CATCH  Final   Special Requests Immunocompromised  Final   Culture   Final    NO GROWTH Performed at [facility], [place] [address], [place], [place] [address]    Report Status 01/04/2022 FINAL  Final  Culture, Respiratory w Gram Stain     Status: None   Collection Time: 01/03/22  6:06 PM   Specimen: Bronchoalveolar Lavage; Respiratory  Result Value Ref Range Status   Specimen Description BRONCHIAL ALVEOLAR LAVAGE  Final   Special Requests NONE  Final   Gram Stain NO WBC SEEN NO ORGANISMS SEEN   Final   Culture   Final    NO GROWTH 2 DAYS Performed at [facility], [address], [place], [place] [address]    Report Status 01/05/2022 FINAL  Final  Body fluid culture w Gram Stain     Status: None   Collection Time: 01/04/22  5:23 PM   Specimen: Pleural Fluid  Result Value Ref Range Status   Specimen Description  PLEURAL  Final   Special Requests NONE  Final   Gram Stain   Final     NO SQUAMOUS EPITHELIAL CELLS SEEN RARE WBC SEEN NO ORGANISMS SEEN    Culture   Final    NO GROWTH Performed at [facility], [address] [place], [place] [address]    Report Status 01/08/2022 FINAL  Final  MRSA Next Gen by PCR, Nasal     Status: Abnormal   Collection Time: 01/05/22 10:30 AM   Specimen: Nasal Mucosa; Nasal Swab  Result Value Ref Range Status   MRSA by PCR Next Gen NEGATIVE (A) NOT DETECTED Final    Comment: Performed at [facility], [address], [place], [place] [address]     DG Chest Port 1 View  Result Date: 01/10/2022 CLINICAL DATA:  Small bowel disease. Respiratory distress. EXAM: PORTABLE CHEST 1 VIEW COMPARISON:  January 06, 2022 FINDINGS: Enteric catheter curled on itself with tip collimated off the image at the level of the mid gastric body. Enlarged cardiac silhouette. Right pleural effusion and right lower lung field airspace consolidation. Mild atelectasis of the left lower lung. Osseous structures are without acute abnormality. Soft tissues are grossly normal. IMPRESSION: 1. Enteric catheter folded on itself with tip collimated off the image at the level of the mid gastric body. 2. Right pleural effusion and right lower lung field airspace consolidation. Electronically Signed   By: Fidela Salisbury M.D.   On: 01/10/2022 16:33   DG Abd Portable 1V  Result Date: 01/10/2022 CLINICAL DATA:  NG tube placement. EXAM: PORTABLE ABDOMEN - 1 VIEW COMPARISON:  01/10/2022 FINDINGS: Enteric feeding tube has been removed. A nasal/orogastric tube passes well below the diaphragm, tip projecting in the distal stomach. Dilated loops of small bowel noted centrally, unchanged from the earlier study. IMPRESSION: Well-positioned nasal/orogastric tube. Electronically Signed   By: Lajean Manes M.D.   On: 01/10/2022 16:32   CT ABDOMEN PELVIS W CONTRAST  Result Date: 01/10/2022 CLINICAL DATA:  Bowel obstruction.  Abdominal pain. EXAM: CT ABDOMEN AND PELVIS WITH  CONTRAST TECHNIQUE: Multidetector CT imaging of the abdomen and pelvis was performed using the standard protocol following bolus administration of intravenous contrast. RADIATION DOSE REDUCTION: This exam was performed according to the departmental dose-optimization program which includes automated exposure control, adjustment of the mA and/or kV according to patient size and/or use of iterative reconstruction technique. CONTRAST:  30m OMNIPAQUE IOHEXOL 300 MG/ML  SOLN COMPARISON:  Chest abdomen pelvis CT 01/03/2022 FINDINGS: Lower chest: Collapse/consolidation in the right lower lung persists but is improved since prior study. Small right pleural effusion noted. The cardio pericardial silhouette is enlarged. Hepatobiliary: No suspicious focal abnormality within the liver parenchyma. There is no evidence for gallstones, gallbladder wall thickening, or pericholecystic fluid. No intrahepatic or extrahepatic biliary dilation. Pancreas: No focal mass lesion. No dilatation of the main duct. No intraparenchymal cyst. No peripancreatic edema. Spleen: No splenomegaly. No focal mass lesion. Adrenals/Urinary Tract: No adrenal nodule or mass. Cortical scarring noted in both kidneys. No hydronephrosis tiny hypodensities in both kidneys are too small to characterize but likely benign. Stomach/Bowel: Stomach is markedly distended with fluid. Feeding tube tip is in the antral region adjacent to the pylorus. Duodenum is normally positioned as is the ligament of Treitz. Small bowel loops are fluid-filled and distended up to 3.4 cm diameter. Small bowel loops in the right lower quadrant a completely decompressed. The terminal ileum is not discretely visible. Cecal tip is in the  lateral right lower quadrant on the previous study and is now in the midline abdomen just deep to the umbilicus (image 81/1. There is pericolonic edema/inflammation associated with the cecum and ascending colon. Pneumatosis is noted in the cecum and ascending  colon. No wall thickening or pneumatosis in the hepatic flexure, transverse colon, or left colon. Diffuse colonic diverticulosis evident. Vascular/Lymphatic: There is moderate atherosclerotic calcification of the abdominal aorta without aneurysm. Portal vein and superior mesenteric vein are patent. No evidence for portal venous gas. Celiac axis, SMA, and IMA are opacified. There is no gastrohepatic or hepatoduodenal ligament lymphadenopathy. No retroperitoneal or mesenteric lymphadenopathy. No pelvic sidewall lymphadenopathy. Reproductive: Small posterior uterine fibroid evident. There is no adnexal mass. Other: Small volume free fluid is seen in the cul-de-sac in the para colic gutters bilaterally. Musculoskeletal: No worrisome lytic or sclerotic osseous abnormality. Body wall edema noted lower abdomen and pelvis. IMPRESSION: 1. Interval development of apparent pneumatosis involving the cecum and ascending colon. Imaging features concerning for ischemic colitis. While the apparent pneumatosis is best appreciated in segments of colon with adjacent intraluminal fluid, given the adjacent pericolonic edema/inflammation, pneumatosis is favored over gas trapped between intraluminal contents and the mucosal surface. Cecum is mobile and while it was in the lower right lower quadrant on the study from 1 week ago, it is now positioned in the midline upper pelvis/abdomen. No luminal narrowing in the cecum or ascending colon to confirm the presence of a cecal volvulus. 2. Small bowel loops in the right lower quadrant are decompressed while proximal and mid small bowel is fluid-filled and distended up to 3.4 cm. Although no discrete transition zone is identified, given the markedly decompressed distal small bowel the right lower quadrant, associated small bowel obstruction is a concern. Inflammatory ileus secondary to the right colonic process would also be a consideration. Right lower quadrant internal hernia not excluded. 3.  Marked distention of the stomach with fluid. Tube tip is in the antral region adjacent to the pylorus. 4. Collapse/consolidation in the right lower lung persists but is improved since prior study. 5. Small volume free fluid in the cul-de-sac and para colic gutters bilaterally. 6. Aortic Atherosclerosis (ICD10-I70.0). These results will be called to the ordering clinician or representative by the Radiologist Assistant, and communication documented in the PACS or [employer]. Electronically Signed   By: Misty Stanley M.D.   On: 01/10/2022 14:02   DG Abd 1 View  Result Date: 01/10/2022 CLINICAL DATA:  Abdominal tenderness with distension. EXAM: ABDOMEN - 1 VIEW COMPARISON:  01/08/2022 FINDINGS: Weighted enteric tube tip in the right upper quadrant in the region of the distal stomach or proximal duodenum. There is mild gaseous gastric distension. Mild gaseous distention of small and large bowel in the central abdomen. No evidence of free air on this supine view. Surgical clips in the right upper quadrant. IMPRESSION: Mild gaseous gastric distension. Mild gaseous distention of small and large bowel in the central abdomen, favoring ileus. Electronically Signed   By: Keith Rake M.D.   On: 01/10/2022 02:51     Family Communication: None at bedside  Disposition: Status is: Inpatient Remains inpatient appropriate because: Severity of illness Planned Discharge Destination:  TBD  Patrecia Pour, MD 01/10/2022 5:23 PM Page by [email or web address]

## 2022-01-10 NOTE — Progress Notes (Addendum)
TR[facility] night coverage note:  Pt with abd pain / fullness overnight.  Soft abdomen, only mild pain per patient.  KUB obtained, findings suggestive of ileus on KUB.  1) stopped tube feeds 2) stopped all laxatives for the moment 3) will see if we can use tube for low intermittent suctioning -> per RN not able to do so, and with no vomiting / severe pain, I didn't feel switching to NGT was needed. 4) consider CT AP later this AM, but will hold off for the moment since abdomen seems non-acute on exam. 5) stopped FWF, holding off on IVF for the moment due to CHF (Takotsubo with severe EF reduction), but may need to start this later this AM at low dose if remains NPO.  Also was satting in upper 80s on RA, this improved to upper 90s with just 2L O2 via [place].  Pt denies respiratory distress / SOB currently.  A.Fib continues to be in RVR, got '5mg'$  IV metoprolol for HR > 150 (in addition to '50mg'$  PO metoprolol), now HR 120s, pt denies any CP though.  Will give another 2.'5mg'$  metoprolol if this doesn't start to improve with just the O2.

## 2022-01-10 NOTE — Progress Notes (Signed)
Progress Note  Patient Name: [name] Date of Encounter: 01/10/2022  Primary Cardiologist:   Kirk Ruths, MD   Subjective   No chest pain.  Still very SOB.  The biggest issue seems to be abdominal distension.    Inpatient Medications    Scheduled Meds:  (feeding supplement) PROSource Plus  30 mL Oral Daily   arformoterol  15 mcg Nebulization BID   budesonide (PULMICORT) nebulizer solution  0.25 mg Nebulization BID   insulin aspart  0-9 Units Subcutaneous Q4H   losartan  25 mg Oral Daily   metoprolol tartrate  50 mg Per Tube Q8H   mouth rinse  15 mL Mouth Rinse 4 times per day   QUEtiapine  25 mg Per Tube BID   revefenacin  175 mcg Nebulization Daily   rivaroxaban  20 mg Per Tube Q supper   Continuous Infusions:   PRN Meds: acetaminophen, acetaminophen, albuterol, ALPRAZolam, melatonin, metoprolol tartrate, mouth rinse, polyethylene glycol   Vital Signs    Vitals:   01/10/22 0544 01/10/22 0612 01/10/22 0737 01/10/22 0837  BP: (!) 106/93  113/87   Pulse: (!) 120 (!) 141 (!) 123   Resp: (!) 28  (!) 21   Temp:   98.7 F (37.1 C)   TempSrc:   Oral   SpO2:   96% 98%  Weight:      Height:        Intake/Output Summary (Last 24 hours) at 01/10/2022 0838 Last data filed at 01/09/2022 1400 Gross per 24 hour  Intake 250 ml  Output 450 ml  Net -200 ml   Filed Weights   01/06/22 0500 01/07/22 0404 01/08/22 0500  Weight: 69.7 kg 66.7 kg 67 kg    Telemetry    Atrial fib with rapid rate - Personally Reviewed  ECG    NA - Personally Reviewed  Physical Exam   GEN: No  acute distress.   Neck: No  JVD Cardiac: Irregular RR, no murmurs, rubs, or gallops.  Distant heart sounds Respiratory: Decreased breath sounds bilaterally with scattered wheezing GI: Soft, nontender, distended, decreased bowel sounds MS:  No edema; No deformity. Neuro:   Nonfocal  Psych: Oriented and appropriate   Labs    Chemistry Recent Labs  Lab 01/04/22 0152 01/04/22 0513  01/04/22 1808 01/05/22 0105 01/06/22 0432 01/06/22 0447 01/07/22 0408 01/08/22 0430 01/09/22 0425 01/10/22 0046  NA 127*   < > 129* 130*   < > 132*   < > 136 138 137  K 4.2   < > 3.4* 3.3*   < > 3.5   < > 3.7 3.4* 4.6  CL 90*   < > 93* 96*  --  100   < > 101 100 105  CO2 25   < > 25 24  --  25   < > '27 27 26  '$ GLUCOSE 151*   < > 143* 192*  --  113*   < > 137* 172* 134*  BUN 15   < > 15 15  --  22   < > 26* 30* 22  CREATININE 0.73   < > 0.67 0.66  --  0.71   < > 0.65 0.60 0.63  CALCIUM 9.0   < > 9.1 9.0  --  8.9   < > 9.2 9.5 9.7  PROT 5.9*  --  5.6* 5.3*  --  5.8*  --   --   --   --   ALBUMIN 2.4*  --   --  2.1*  --  2.3*  --   --   --   --   AST 69*  --   --  25  --  31  --   --   --   --   ALT 26  --   --  21  --  22  --   --   --   --   ALKPHOS 64  --   --  52  --  77  --   --   --   --   BILITOT 2.1*  --   --  1.3*  --  1.1  --   --   --   --   GFRNONAA >60   < > >60 >60  --  >60   < > >60 >60 >60  ANIONGAP 12   < > 11 10  --  7   < > '8 11 6   '$ < > = values in this interval not displayed.     Hematology Recent Labs  Lab 01/06/22 0447 01/07/22 0408 01/08/22 0430  WBC 17.6* 12.3* 16.8*  RBC 3.04* 3.01* 3.05*  HGB 11.1* 11.1* 11.1*  HCT 32.4* 31.2* 32.1*  MCV 106.6* 103.7* 105.2*  MCH 36.5* 36.9* 36.4*  MCHC 34.3 35.6 34.6  RDW 13.3 13.0 13.3  PLT 156 132* 171    Cardiac EnzymesNo results for input(s): "TROPONINI" in the last 168 hours. No results for input(s): "TROPIPOC" in the last 168 hours.   BNP No results for input(s): "BNP", "PROBNP" in the last 168 hours.    DDimer  No results for input(s): "DDIMER" in the last 168 hours.    Radiology    DG Abd 1 View  Result Date: 01/10/2022 CLINICAL DATA:  Abdominal tenderness with distension. EXAM: ABDOMEN - 1 VIEW COMPARISON:  01/08/2022 FINDINGS: Weighted enteric tube tip in the right upper quadrant in the region of the distal stomach or proximal duodenum. There is mild gaseous gastric distension. Mild gaseous  distention of small and large bowel in the central abdomen. No evidence of free air on this supine view. Surgical clips in the right upper quadrant. IMPRESSION: Mild gaseous gastric distension. Mild gaseous distention of small and large bowel in the central abdomen, favoring ileus. Electronically Signed   By: Keith Rake M.D.   On: 01/10/2022 02:51   DG Abd Portable 1V  Result Date: 01/08/2022 CLINICAL DATA:  Encounter for NG tube. EXAM: PORTABLE ABDOMEN - 1 VIEW COMPARISON:  Radiographs earlier the same date.  CT 01/03/2022. FINDINGS: 1523 hours. Feeding tube projects over the right upper quadrant of the abdomen, likely in the distal stomach or proximal duodenum. Central venous catheter projects over the mid right atrium. The bowel gas pattern remains nonobstructive. Unchanged right basilar airspace disease. IMPRESSION: Unchanged position of the feeding tube in the right upper quadrant, likely in the distal stomach or proximal duodenum. Electronically Signed   By: Richardean Sale M.D.   On: 01/08/2022 15:29   DG Abd Portable 1V  Result Date: 01/08/2022 CLINICAL DATA:  Provided history: Cortrak feeding tube placement. EXAM: PORTABLE ABDOMEN - 1 VIEW COMPARISON:  CT chest/abdomen/pelvis 01/03/2022. FINDINGS: Central venous catheter with tip terminating at the level of the superior cavoatrial junction. An enteric tube is present with tip terminating in the expected location of the distal stomach/proximal duodenum. Nonobstructive bowel gas pattern. IMPRESSION: Enteric tube present with tip terminating in the expected location of the distal stomach/proximal duodenum. Electronically Signed   By: Marylyn Ishihara  Armandina Gemma D.O.   On: 01/08/2022 10:35    Cardiac Studies   Echo:  See above.   Patient Profile     81 y.o. female with a hx of atrial fib on propranolol 80 mg qd and Xarelto, HTN, asthma, breast CA, anxiety, who is being seen 01/02/2022 for the evaluation of CHF & rapid atrial fib at the request of Dr Marthenia Rolling.  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    Assessment & Plan    Atrial fib with RVR:      On DOAC.  Beta blocker increased yesterday.  She has required doses of IV metoprolol.  I will increased beta blocker to qid with hold orders.     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  (MR worse than previous likely functional.)   Follow clinically.    Acute systolic HF:   EF markedly reduced with possible Takotsubo.   Net negative 1.7.  Started Cozaar yesterday.      For questions or updates, please contact [place] Please consult [email or web address] for contact info under Cardiology/STEMI.   Signed, Minus Breeding, MD  01/10/2022, 8:38 AM

## 2022-01-10 NOTE — Plan of Care (Signed)

## 2022-01-11 ENCOUNTER — Inpatient Hospital Stay (HOSPITAL_COMMUNITY): Payer: Medicare Other | Admitting: General Practice

## 2022-01-11 ENCOUNTER — Inpatient Hospital Stay (HOSPITAL_COMMUNITY): Payer: Medicare Other

## 2022-01-11 ENCOUNTER — Encounter (HOSPITAL_COMMUNITY): Admission: EM | Disposition: E | Payer: Self-pay | Source: Home / Self Care | Attending: Internal Medicine

## 2022-01-11 ENCOUNTER — Other Ambulatory Visit: Payer: Self-pay

## 2022-01-11 ENCOUNTER — Encounter (HOSPITAL_COMMUNITY): Payer: Self-pay | Admitting: Internal Medicine

## 2022-01-11 DIAGNOSIS — K631 Perforation of intestine (nontraumatic): Secondary | ICD-10-CM | POA: Diagnosis not present

## 2022-01-11 DIAGNOSIS — J189 Pneumonia, unspecified organism: Secondary | ICD-10-CM | POA: Diagnosis not present

## 2022-01-11 DIAGNOSIS — I469 Cardiac arrest, cause unspecified: Secondary | ICD-10-CM | POA: Diagnosis not present

## 2022-01-11 DIAGNOSIS — I4891 Unspecified atrial fibrillation: Secondary | ICD-10-CM | POA: Diagnosis not present

## 2022-01-11 DIAGNOSIS — I110 Hypertensive heart disease with heart failure: Secondary | ICD-10-CM | POA: Diagnosis not present

## 2022-01-11 DIAGNOSIS — I4819 Other persistent atrial fibrillation: Secondary | ICD-10-CM | POA: Diagnosis not present

## 2022-01-11 DIAGNOSIS — K559 Vascular disorder of intestine, unspecified: Secondary | ICD-10-CM

## 2022-01-11 DIAGNOSIS — I5181 Takotsubo syndrome: Secondary | ICD-10-CM | POA: Diagnosis not present

## 2022-01-11 DIAGNOSIS — I509 Heart failure, unspecified: Secondary | ICD-10-CM

## 2022-01-11 DIAGNOSIS — J9601 Acute respiratory failure with hypoxia: Secondary | ICD-10-CM | POA: Diagnosis not present

## 2022-01-11 DIAGNOSIS — K567 Ileus, unspecified: Secondary | ICD-10-CM | POA: Diagnosis not present

## 2022-01-11 HISTORY — PX: LAPAROTOMY: SHX154

## 2022-01-11 HISTORY — PX: COLON RESECTION: SHX5231

## 2022-01-11 LAB — CBC WITH DIFFERENTIAL/PLATELET
Abs Immature Granulocytes: 0.55 10*3/uL — ABNORMAL HIGH (ref 0.00–0.07)
Basophils Absolute: 0.1 10*3/uL (ref 0.0–0.1)
Basophils Relative: 0 %
Eosinophils Absolute: 0 10*3/uL (ref 0.0–0.5)
Eosinophils Relative: 0 %
HCT: 37 % (ref 36.0–46.0)
Hemoglobin: 12.1 g/dL (ref 12.0–15.0)
Immature Granulocytes: 2 %
Lymphocytes Relative: 3 %
Lymphs Abs: 0.9 10*3/uL (ref 0.7–4.0)
MCH: 36 pg — ABNORMAL HIGH (ref 26.0–34.0)
MCHC: 32.7 g/dL (ref 30.0–36.0)
MCV: 110.1 fL — ABNORMAL HIGH (ref 80.0–100.0)
Monocytes Absolute: 1.9 10*3/uL — ABNORMAL HIGH (ref 0.1–1.0)
Monocytes Relative: 6 %
Neutro Abs: 25.8 10*3/uL — ABNORMAL HIGH (ref 1.7–7.7)
Neutrophils Relative %: 89 %
Platelets: 232 10*3/uL (ref 150–400)
RBC: 3.36 MIL/uL — ABNORMAL LOW (ref 3.87–5.11)
RDW: 13.5 % (ref 11.5–15.5)
WBC: 29.3 10*3/uL — ABNORMAL HIGH (ref 4.0–10.5)
nRBC: 0.1 % (ref 0.0–0.2)

## 2022-01-11 LAB — GLUCOSE, CAPILLARY
Glucose-Capillary: 69 mg/dL — ABNORMAL LOW (ref 70–99)
Glucose-Capillary: 73 mg/dL (ref 70–99)
Glucose-Capillary: 60 mg/dL — ABNORMAL LOW (ref 70–99)
Glucose-Capillary: 88 mg/dL (ref 70–99)
Glucose-Capillary: 88 mg/dL (ref 70–99)
Glucose-Capillary: 129 mg/dL — ABNORMAL HIGH (ref 70–99)
Glucose-Capillary: 237 mg/dL — ABNORMAL HIGH (ref 70–99)
Glucose-Capillary: 282 mg/dL — ABNORMAL HIGH (ref 70–99)

## 2022-01-11 LAB — COMPREHENSIVE METABOLIC PANEL
ALT: 88 U/L — ABNORMAL HIGH (ref 0–44)
ALT: 59 U/L — ABNORMAL HIGH (ref 0–44)
AST: 55 U/L — ABNORMAL HIGH (ref 15–41)
AST: 38 U/L (ref 15–41)
Albumin: 1.9 g/dL — ABNORMAL LOW (ref 3.5–5.0)
Albumin: <1.5 g/dL — ABNORMAL LOW (ref 3.5–5.0)
Alkaline Phosphatase: 130 U/L — ABNORMAL HIGH (ref 38–126)
Alkaline Phosphatase: 101 U/L (ref 38–126)
Anion gap: 11 (ref 5–15)
Anion gap: 7 (ref 5–15)
BUN: 16 mg/dL (ref 8–23)
BUN: 17 mg/dL (ref 8–23)
CO2: 28 mmol/L (ref 22–32)
CO2: 27 mmol/L (ref 22–32)
Calcium: 9.9 mg/dL (ref 8.9–10.3)
Calcium: 8.8 mg/dL — ABNORMAL LOW (ref 8.9–10.3)
Chloride: 100 mmol/L (ref 98–111)
Chloride: 102 mmol/L (ref 98–111)
Creatinine, Ser: 0.61 mg/dL (ref 0.44–1.00)
Creatinine, Ser: 0.59 mg/dL (ref 0.44–1.00)
GFR, Estimated: >60 mL/min (ref >60)
GFR, Estimated: >60 mL/min (ref >60)
Glucose, Bld: 87 mg/dL (ref 70–99)
Glucose, Bld: 208 mg/dL — ABNORMAL HIGH (ref 70–99)
Potassium: 4.6 mmol/L (ref 3.5–5.1)
Potassium: 4.4 mmol/L (ref 3.5–5.1)
Sodium: 139 mmol/L (ref 135–145)
Sodium: 136 mmol/L (ref 135–145)
Total Bilirubin: 2.4 mg/dL — ABNORMAL HIGH (ref 0.3–1.2)
Total Bilirubin: 2.4 mg/dL — ABNORMAL HIGH (ref 0.3–1.2)
Total Protein: 5.7 g/dL — ABNORMAL LOW (ref 6.5–8.1)
Total Protein: 4.5 g/dL — ABNORMAL LOW (ref 6.5–8.1)

## 2022-01-11 LAB — BASIC METABOLIC PANEL
Anion gap: 9 (ref 5–15)
BUN: 16 mg/dL (ref 8–23)
CO2: 27 mmol/L (ref 22–32)
Calcium: 10.1 mg/dL (ref 8.9–10.3)
Chloride: 103 mmol/L (ref 98–111)
Creatinine, Ser: 0.66 mg/dL (ref 0.44–1.00)
GFR, Estimated: >60 mL/min (ref >60)
Glucose, Bld: 106 mg/dL — ABNORMAL HIGH (ref 70–99)
Potassium: 5.2 mmol/L — ABNORMAL HIGH (ref 3.5–5.1)
Sodium: 139 mmol/L (ref 135–145)

## 2022-01-11 LAB — COOXEMETRY PANEL
Carboxyhemoglobin: 2.2 % — ABNORMAL HIGH (ref 0.5–1.5)
Methemoglobin: <0.7 % (ref 0.0–1.5)
O2 Saturation: 64.7 %
Total hemoglobin: 10.9 g/dL — ABNORMAL LOW (ref 12.0–16.0)

## 2022-01-11 LAB — POCT I-STAT 7, (LYTES, BLD GAS, ICA,H+H)
Acid-Base Excess: 10 mmol/L — ABNORMAL HIGH (ref 0.0–2.0)
Acid-Base Excess: 4 mmol/L — ABNORMAL HIGH (ref 0.0–2.0)
Bicarbonate: 34.8 mmol/L — ABNORMAL HIGH (ref 20.0–28.0)
Bicarbonate: 28.5 mmol/L — ABNORMAL HIGH (ref 20.0–28.0)
Calcium, Ion: 1.35 mmol/L (ref 1.15–1.40)
Calcium, Ion: 1.35 mmol/L (ref 1.15–1.40)
HCT: 30 % — ABNORMAL LOW (ref 36.0–46.0)
HCT: 34 % — ABNORMAL LOW (ref 36.0–46.0)
Hemoglobin: 10.2 g/dL — ABNORMAL LOW (ref 12.0–15.0)
Hemoglobin: 11.6 g/dL — ABNORMAL LOW (ref 12.0–15.0)
O2 Saturation: 100 %
O2 Saturation: 99 %
Patient temperature: 35.8
Potassium: 4.5 mmol/L (ref 3.5–5.1)
Potassium: 4.3 mmol/L (ref 3.5–5.1)
Sodium: 137 mmol/L (ref 135–145)
Sodium: 136 mmol/L (ref 135–145)
TCO2: 36 mmol/L — ABNORMAL HIGH (ref 22–32)
TCO2: 30 mmol/L (ref 22–32)
pCO2 arterial: 43.8 mmHg (ref 32–48)
pCO2 arterial: 42.2 mmHg (ref 32–48)
pH, Arterial: 7.503 — ABNORMAL HIGH (ref 7.35–7.45)
pH, Arterial: 7.437 (ref 7.35–7.45)
pO2, Arterial: 471 mmHg — ABNORMAL HIGH (ref 83–108)
pO2, Arterial: 113 mmHg — ABNORMAL HIGH (ref 83–108)

## 2022-01-11 LAB — APTT: aPTT: 67 seconds — ABNORMAL HIGH (ref 24–36)

## 2022-01-11 LAB — MAGNESIUM
Magnesium: 1.7 mg/dL (ref 1.7–2.4)
Magnesium: 1.7 mg/dL (ref 1.7–2.4)

## 2022-01-11 LAB — PREPARE RBC (CROSSMATCH)

## 2022-01-11 LAB — CORTISOL: Cortisol, Plasma: 12 ug/dL

## 2022-01-11 LAB — PROCALCITONIN: Procalcitonin: 0.38 ng/mL

## 2022-01-11 LAB — LACTIC ACID, PLASMA
Lactic Acid, Venous: 2.6 mmol/L (ref 0.5–1.9)
Lactic Acid, Venous: 1.2 mmol/L (ref 0.5–1.9)

## 2022-01-11 LAB — BRAIN NATRIURETIC PEPTIDE: B Natriuretic Peptide: 334.1 pg/mL — ABNORMAL HIGH (ref 0.0–100.0)

## 2022-01-11 SURGERY — LAPAROTOMY, EXPLORATORY
Anesthesia: General | Site: Abdomen

## 2022-01-11 MED ORDER — DOCUSATE SODIUM 50 MG/5ML PO LIQD
100.0000 mg | Freq: Two times a day (BID) | ORAL | Status: DC | PRN
Start: 2022-01-11 — End: 2022-01-12

## 2022-01-11 MED ORDER — POLYETHYLENE GLYCOL 3350 17 G PO PACK: 17.0000 g | PACK | Freq: Every day | ORAL | Status: DC | PRN

## 2022-01-11 MED ORDER — LACTATED RINGERS IV SOLN: INTRAVENOUS | Status: DC | PRN

## 2022-01-11 MED ORDER — PROPOFOL 1000 MG/100ML IV EMUL
0.0000 ug/kg/min | INTRAVENOUS | Status: DC
  Administered 2022-01-11: 10 ug/kg/min via INTRAVENOUS
  Administered 2022-01-11 – 2022-01-12 (×4): 40 ug/kg/min via INTRAVENOUS
  Administered 2022-01-13: 15 ug/kg/min via INTRAVENOUS
  Administered 2022-01-13: 30 ug/kg/min via INTRAVENOUS
  Administered 2022-01-14: 10 ug/kg/min via INTRAVENOUS
  Filled 2022-01-11 (×9): qty 100

## 2022-01-11 MED ORDER — DEXTROSE 50 % IV SOLN
INTRAVENOUS | Status: AC
  Administered 2022-01-11: 25 mL
  Filled 2022-01-11: qty 50

## 2022-01-11 MED ORDER — SUCCINYLCHOLINE CHLORIDE 200 MG/10ML IV SOSY
PREFILLED_SYRINGE | INTRAVENOUS | Status: DC | PRN
  Administered 2022-01-11: 100 mg via INTRAVENOUS

## 2022-01-11 MED ORDER — NOREPINEPHRINE 4 MG/250ML-% IV SOLN
0.0000 ug/min | INTRAVENOUS | Status: DC
  Administered 2022-01-11: 12 ug/min via INTRAVENOUS
  Administered 2022-01-12: 11 ug/min via INTRAVENOUS
  Administered 2022-01-13 (×2): 2 ug/min via INTRAVENOUS
  Administered 2022-01-14 (×3): 8 ug/min via INTRAVENOUS
  Filled 2022-01-11 (×6): qty 250

## 2022-01-11 MED ORDER — PANTOPRAZOLE 2 MG/ML SUSPENSION: 40.0000 mg | Freq: Every day | ORAL | Status: DC

## 2022-01-11 MED ORDER — DEXTROSE-NACL 5-0.45 % IV SOLN: INTRAVENOUS | Status: DC

## 2022-01-11 MED ORDER — EPINEPHRINE HCL 5 MG/250ML IV SOLN IN NS: 0.5000 ug/min | INTRAVENOUS | Status: DC

## 2022-01-11 MED ORDER — ONDANSETRON HCL 4 MG/2ML IJ SOLN
INTRAMUSCULAR | Status: DC | PRN
  Administered 2022-01-11: 4 mg via INTRAVENOUS

## 2022-01-11 MED ORDER — MAGNESIUM SULFATE 2 GM/50ML IV SOLN
2.0000 g | Freq: Once | INTRAVENOUS | Status: AC
Start: 2022-01-11 — End: 2022-01-11
  Administered 2022-01-11: 2 g via INTRAVENOUS
  Filled 2022-01-11: qty 50

## 2022-01-11 MED ORDER — AMIODARONE LOAD VIA INFUSION
150.0000 mg | Freq: Once | INTRAVENOUS | Status: AC
Start: 2022-01-11 — End: 2022-01-11
  Administered 2022-01-11: 150 mg via INTRAVENOUS
  Filled 2022-01-11: qty 83.34

## 2022-01-11 MED ORDER — FENTANYL CITRATE (PF) 250 MCG/5ML IJ SOLN
INTRAMUSCULAR | Status: DC | PRN
  Administered 2022-01-11: 50 ug via INTRAVENOUS

## 2022-01-11 MED ORDER — 0.9 % SODIUM CHLORIDE (POUR BTL) OPTIME
TOPICAL | Status: DC | PRN
  Administered 2022-01-11 (×6): 1000 mL

## 2022-01-11 MED ORDER — LACTATED RINGERS IV BOLUS: 500.0000 mL | Freq: Once | INTRAVENOUS | Status: DC

## 2022-01-11 MED ORDER — PROTHROMBIN COMPLEX CONC HUMAN 500 UNITS IV KIT
3366.0000 [IU] | PACK | Status: DC
  Filled 2022-01-11: qty 3366

## 2022-01-11 MED ORDER — FENTANYL CITRATE (PF) 100 MCG/2ML IJ SOLN: 25.0000 ug | INTRAMUSCULAR | Status: DC | PRN

## 2022-01-11 MED ORDER — SODIUM CHLORIDE 0.9 % IV SOLN: INTRAVENOUS | Status: DC | PRN

## 2022-01-11 MED ORDER — SODIUM CHLORIDE 0.9 % IV SOLN
100.0000 mg | INTRAVENOUS | Status: DC
  Administered 2022-01-11 – 2022-01-14 (×4): 100 mg via INTRAVENOUS
  Filled 2022-01-11 (×5): qty 5

## 2022-01-11 MED ORDER — ORAL CARE MOUTH RINSE: 15.0000 mL | OROMUCOSAL | Status: DC | PRN

## 2022-01-11 MED ORDER — DOCUSATE SODIUM 50 MG/5ML PO LIQD
100.0000 mg | Freq: Two times a day (BID) | ORAL | Status: DC
  Administered 2022-01-11: 100 mg
  Filled 2022-01-11: qty 10

## 2022-01-11 MED ORDER — FAMOTIDINE IN NACL 20-0.9 MG/50ML-% IV SOLN
20.0000 mg | Freq: Two times a day (BID) | INTRAVENOUS | Status: DC
  Administered 2022-01-11 – 2022-01-14 (×6): 20 mg via INTRAVENOUS
  Filled 2022-01-11 (×7): qty 50

## 2022-01-11 MED ORDER — LACTATED RINGERS IV BOLUS: 1000.0000 mL | Freq: Once | INTRAVENOUS | Status: DC

## 2022-01-11 MED ORDER — POLYETHYLENE GLYCOL 3350 17 G PO PACK
17.0000 g | PACK | Freq: Every day | ORAL | Status: DC
Start: 2022-01-11 — End: 2022-01-12

## 2022-01-11 MED ORDER — PROTHROMBIN COMPLEX CONC HUMAN 500 UNITS IV KIT
3607.0000 [IU] | PACK | Status: AC
  Administered 2022-01-11: 3607 [IU] via INTRAVENOUS
  Filled 2022-01-11: qty 2000

## 2022-01-11 MED ORDER — VASOPRESSIN 20 UNITS/100 ML INFUSION FOR SHOCK
0.0000 [IU]/min | INTRAVENOUS | Status: DC
  Administered 2022-01-11: 0.03 [IU]/min via INTRAVENOUS
  Administered 2022-01-11: .03 [IU]/min via INTRAVENOUS
  Administered 2022-01-12: 0.03 [IU]/min via INTRAVENOUS
  Administered 2022-01-12: 0.02 [IU]/min via INTRAVENOUS
  Filled 2022-01-11 (×3): qty 100

## 2022-01-11 MED ORDER — PHENYLEPHRINE 80 MCG/ML (10ML) SYRINGE FOR IV PUSH (FOR BLOOD PRESSURE SUPPORT)
PREFILLED_SYRINGE | INTRAVENOUS | Status: DC | PRN
  Administered 2022-01-11 (×2): 80 ug via INTRAVENOUS
  Administered 2022-01-11: 160 ug via INTRAVENOUS

## 2022-01-11 MED ORDER — FENTANYL CITRATE PF 50 MCG/ML IJ SOSY: 25.0000 ug | PREFILLED_SYRINGE | INTRAMUSCULAR | Status: DC | PRN

## 2022-01-11 MED ORDER — ONDANSETRON HCL 4 MG/2ML IJ SOLN
4.0000 mg | Freq: Four times a day (QID) | INTRAMUSCULAR | Status: DC | PRN
  Administered 2022-01-24 – 2022-02-02 (×2): 4 mg via INTRAVENOUS
  Filled 2022-01-11 (×2): qty 2

## 2022-01-11 MED ORDER — ORAL CARE MOUTH RINSE: 15.0000 mL | Freq: Once | OROMUCOSAL | Status: DC

## 2022-01-11 MED ORDER — ETOMIDATE 2 MG/ML IV SOLN
INTRAVENOUS | Status: AC
  Filled 2022-01-11: qty 10

## 2022-01-11 MED ORDER — ORAL CARE MOUTH RINSE
15.0000 mL | OROMUCOSAL | Status: DC
  Administered 2022-01-11 – 2022-01-19 (×66): 15 mL via OROMUCOSAL

## 2022-01-11 MED ORDER — ROCURONIUM BROMIDE 10 MG/ML (PF) SYRINGE
PREFILLED_SYRINGE | INTRAVENOUS | Status: DC | PRN
  Administered 2022-01-11: 20 mg via INTRAVENOUS
  Administered 2022-01-11: 50 mg via INTRAVENOUS

## 2022-01-11 MED ORDER — SODIUM CHLORIDE 0.9 % IV SOLN: INTRAVENOUS | Status: DC

## 2022-01-11 MED ORDER — MIDAZOLAM HCL 2 MG/2ML IJ SOLN
INTRAMUSCULAR | Status: DC | PRN
  Administered 2022-01-11 (×2): 1 mg via INTRAVENOUS

## 2022-01-11 MED ORDER — FENTANYL CITRATE PF 50 MCG/ML IJ SOSY
25.0000 ug | PREFILLED_SYRINGE | INTRAMUSCULAR | Status: DC | PRN
  Administered 2022-01-11 (×4): 100 ug via INTRAVENOUS
  Administered 2022-01-11: 50 ug via INTRAVENOUS
  Administered 2022-01-12 (×2): 100 ug via INTRAVENOUS
  Filled 2022-01-11: qty 1
  Filled 2022-01-11 (×6): qty 2

## 2022-01-11 MED ORDER — AMIODARONE HCL IN DEXTROSE 360-4.14 MG/200ML-% IV SOLN
60.0000 mg/h | INTRAVENOUS | Status: AC
  Administered 2022-01-11 (×2): 60 mg/h via INTRAVENOUS
  Filled 2022-01-11 (×2): qty 200

## 2022-01-11 MED ORDER — NOREPINEPHRINE 4 MG/250ML-% IV SOLN
0.0000 ug/min | INTRAVENOUS | Status: DC
  Administered 2022-01-11: 2 ug/min via INTRAVENOUS

## 2022-01-11 MED ORDER — MIDAZOLAM HCL 2 MG/2ML IJ SOLN
INTRAMUSCULAR | Status: AC
  Filled 2022-01-11: qty 2

## 2022-01-11 MED ORDER — CHLORHEXIDINE GLUCONATE 0.12 % MT SOLN: 15.0000 mL | Freq: Once | OROMUCOSAL | Status: DC

## 2022-01-11 MED ORDER — AMIODARONE HCL IN DEXTROSE 360-4.14 MG/200ML-% IV SOLN
30.0000 mg/h | INTRAVENOUS | Status: DC
  Administered 2022-01-11 – 2022-01-20 (×16): 30 mg/h via INTRAVENOUS
  Filled 2022-01-11 (×15): qty 200

## 2022-01-11 MED ORDER — CHLORHEXIDINE GLUCONATE 0.12 % MT SOLN
OROMUCOSAL | Status: AC
  Filled 2022-01-11: qty 15

## 2022-01-11 MED ORDER — HYDROMORPHONE HCL 1 MG/ML IJ SOLN
0.5000 mg | INTRAMUSCULAR | Status: DC | PRN
  Administered 2022-01-11: 0.5 mg via INTRAVENOUS
  Filled 2022-01-11: qty 0.5

## 2022-01-11 MED ORDER — DEXTROSE-NACL 5-0.45 % IV SOLN
INTRAVENOUS | Status: DC
Start: 2022-01-11 — End: 2022-01-11

## 2022-01-11 MED ORDER — LACTATED RINGERS IV SOLN
INTRAVENOUS | Status: DC
Start: 2022-01-11 — End: 2022-01-11

## 2022-01-11 MED ORDER — FENTANYL CITRATE (PF) 250 MCG/5ML IJ SOLN
INTRAMUSCULAR | Status: AC
  Filled 2022-01-11: qty 5

## 2022-01-11 MED ORDER — DEXAMETHASONE SODIUM PHOSPHATE 10 MG/ML IJ SOLN
INTRAMUSCULAR | Status: DC | PRN
  Administered 2022-01-11: 5 mg via INTRAVENOUS

## 2022-01-11 MED ORDER — ETOMIDATE 2 MG/ML IV SOLN
INTRAVENOUS | Status: DC | PRN
  Administered 2022-01-11: 10 mg via INTRAVENOUS

## 2022-01-11 MED ORDER — SODIUM CHLORIDE 0.9 % IV SOLN: 10.0000 mL/h | Freq: Once | INTRAVENOUS | Status: DC

## 2022-01-11 SURGICAL SUPPLY — 42 items
BAG COUNTER SPONGE SURGICOUNT (BAG) ×3 IMPLANT
BAG SPNG CNTER NS LX DISP (BAG) ×1
CANISTER SUCT 3000ML PPV (MISCELLANEOUS) ×3 IMPLANT
CANISTER WOUNDNEG PRESSURE 500 (CANNISTER) ×1 IMPLANT
COVER SURGICAL LIGHT HANDLE (MISCELLANEOUS) ×6 IMPLANT
DRAPE LAPAROSCOPIC ABDOMINAL (DRAPES) ×3 IMPLANT
DRAPE WARM FLUID 44X44 (DRAPES) ×3 IMPLANT
ELECT BLADE 6.5 EXT (BLADE) ×1 IMPLANT
ELECT CAUTERY BLADE 6.4 (BLADE) ×3 IMPLANT
ELECT REM PT RETURN 9FT ADLT (ELECTROSURGICAL) ×2
ELECTRODE REM PT RTRN 9FT ADLT (ELECTROSURGICAL) ×2 IMPLANT
GLOVE BIO SURGEON STRL SZ7.5 (GLOVE) ×6 IMPLANT
GLOVE BIOGEL PI IND STRL 8 (GLOVE) ×4 IMPLANT
GLOVE BIOGEL PI INDICATOR 8 (GLOVE) ×2
GLOVE SURG SYN 7.5  E (GLOVE) ×4
GLOVE SURG SYN 7.5 E (GLOVE) ×2 IMPLANT
GLOVE SURG SYN 7.5 PF PI (GLOVE) ×4 IMPLANT
GOWN STRL REUS W/ TWL LRG LVL3 (GOWN DISPOSABLE) ×8 IMPLANT
GOWN STRL REUS W/ TWL XL LVL3 (GOWN DISPOSABLE) ×4 IMPLANT
GOWN STRL REUS W/TWL LRG LVL3 (GOWN DISPOSABLE) ×4
GOWN STRL REUS W/TWL XL LVL3 (GOWN DISPOSABLE) ×2
HANDLE SUCTION POOLE (INSTRUMENTS) ×2 IMPLANT
KIT BASIN OR (CUSTOM PROCEDURE TRAY) ×3 IMPLANT
KIT TURNOVER KIT B (KITS) ×3 IMPLANT
LIGASURE IMPACT 36 18CM CVD LR (INSTRUMENTS) ×1 IMPLANT
NS IRRIG 1000ML POUR BTL (IV SOLUTION) ×7 IMPLANT
PACK GENERAL/GYN (CUSTOM PROCEDURE TRAY) ×3 IMPLANT
PAD ARMBOARD 7.5X6 YLW CONV (MISCELLANEOUS) ×3 IMPLANT
RELOAD PROXIMATE 75MM BLUE (ENDOMECHANICALS) ×2 IMPLANT
RELOAD STAPLE 75 3.8 BLU REG (ENDOMECHANICALS) IMPLANT
SPECIMEN JAR LARGE (MISCELLANEOUS) ×1 IMPLANT
SPONGE ABDOMINAL VAC ABTHERA (MISCELLANEOUS) ×1 IMPLANT
SPONGE T-LAP 18X18 ~~LOC~~+RFID (SPONGE) ×1 IMPLANT
STAPLER PROXIMATE 75MM BLUE (STAPLE) ×1 IMPLANT
STAPLER VISISTAT 35W (STAPLE) ×3 IMPLANT
SUCTION POOLE HANDLE (INSTRUMENTS) ×2
SUT SILK 2 0 SH CR/8 (SUTURE) ×3 IMPLANT
SUT SILK 2 0 TIES 10X30 (SUTURE) ×3 IMPLANT
SUT SILK 3 0 TIES 10X30 (SUTURE) ×3 IMPLANT
TOWEL GREEN STERILE (TOWEL DISPOSABLE) ×3 IMPLANT
TRAY FOLEY MTR SLVR 16FR STAT (SET/KITS/TRAYS/PACK) ×3 IMPLANT
TUBE CONNECTING 12X1/4 (SUCTIONS) ×6 IMPLANT

## 2022-01-11 NOTE — Anesthesia Procedure Notes (Signed)
Central Venous Catheter Insertion Performed by: Roderic Palau, MD, anesthesiologist Start/End08/26/2023 9:50 AM, 01/10/2022 10:05 AM Patient location: Pre-op. Preanesthetic checklist: patient identified, IV checked, site marked, risks and benefits discussed, surgical consent, monitors and equipment checked, pre-op evaluation, timeout performed and anesthesia consent Hand hygiene performed  and maximum sterile barriers used  PA cath was placed.Swan type:thermodilution PA Cath depth:50 Procedure performed without using ultrasound guided technique. Attempts: 1 Patient tolerated the procedure well with no immediate complications.

## 2022-01-11 NOTE — Progress Notes (Addendum)
Progress Note  Patient Name: [name] Date of Encounter: 02/01/2022  [facility] Cardiologist: Kirk Ruths, MD   Subjective   No CP or dyspnea.  Confused this morning.  Inpatient Medications    Scheduled Meds:  arformoterol  15 mcg Nebulization BID   budesonide (PULMICORT) nebulizer solution  0.25 mg Nebulization BID   dextrose  50 mL Intravenous Once   insulin aspart  0-9 Units Subcutaneous Q4H   metoprolol tartrate  5 mg Intravenous Q6H   mouth rinse  15 mL Mouth Rinse 4 times per day   revefenacin  175 mcg Nebulization Daily   Continuous Infusions:  dextrose 5 % and 0.45% NaCl 50 mL/hr at 01/08/2022 0436   heparin 900 Units/hr (02/02/2022 0128)   piperacillin-tazobactam (ZOSYN)  IV 3.375 g (01/10/22 2256)   prothrombin complex conc human (KCENTRA) IVPB 3,607 Units     PRN Meds: acetaminophen, HYDROmorphone (DILAUDID) injection, levalbuterol, mouth rinse   Vital Signs    Vitals:   01/10/22 2021 01/10/22 2356 01/17/2022 0400 01/24/2022 0500  BP: (!) 137/112 (!) 141/89 97/67 112/82  Pulse: (!) 135 (!) 132 (!) 117 (!) 127  Resp: (!) 29 (!) 25 (!) 24 (!) 31  Temp: (!) 101.4 F (38.6 C) 98 F (36.7 C)  (!) 101.3 F (38.5 C)  TempSrc: Oral Oral  Rectal  SpO2:   100%   Weight:   69.5 kg   Height:        Intake/Output Summary (Last 24 hours) at 01/26/2022 0649 Last data filed at 01/22/2022 0600 Gross per 24 hour  Intake 768.72 ml  Output 100 ml  Net 668.72 ml      01/17/2022    4:00 AM 01/08/2022    5:00 AM 01/07/2022    4:04 AM  Last 3 Weights  Weight (lbs) 153 lb 3.5 oz 147 lb 11.3 oz 147 lb 0.8 oz  Weight (kg) 69.5 kg 67 kg 66.7 kg      Telemetry    Atrial fibrillation rate elevated this AM- Personally Reviewed   Physical Exam   GEN: Confused Neck: supple Cardiac: irregular, tachycardic Respiratory: CTA anteriorly GI: Tender to palpation MS: No edema Neuro:  Moves all ext Psych: Confused  Labs    High Sensitivity Troponin:   Recent Labs   Lab 01/02/22 1023 01/02/22 1308 01/03/22 2119 01/04/22 0513 01/05/22 0502  TROPONINIHS 8 11 123* 131* 55*     Chemistry Recent Labs  Lab 01/05/22 0105 01/05/22 1632 01/06/22 0447 01/07/22 0408 01/08/22 0430 01/09/22 0425 01/10/22 0046 01/14/2022 0041  NA 130*   < > 132*   < > 136 138 137 139  K 3.3*   < > 3.5   < > 3.7 3.4* 4.6 4.6  CL 96*  --  100   < > 101 100 105 100  CO2 24  --  25   < > '27 27 26 28  '$ GLUCOSE 192*  --  113*   < > 137* 172* 134* 87  BUN 15  --  22   < > 26* 30* 22 16  CREATININE 0.66  --  0.71   < > 0.65 0.60 0.63 0.61  CALCIUM 9.0  --  8.9   < > 9.2 9.5 9.7 9.9  MG 1.6*   < > 2.4  --  2.2 2.0 2.0  --   PROT 5.3*  --  5.8*  --   --   --   --  5.7*  ALBUMIN 2.1*  --  2.3*  --   --   --   --  1.9*  AST 25  --  31  --   --   --   --  55*  ALT 21  --  22  --   --   --   --  88*  ALKPHOS 52  --  77  --   --   --   --  130*  BILITOT 1.3*  --  1.1  --   --   --   --  2.4*  GFRNONAA >60  --  >60   < > >60 >60 >60 >60  ANIONGAP 10  --  7   < > '8 11 6 11   '$ < > = values in this interval not displayed.     Hematology Recent Labs  Lab 01/08/22 0430 01/10/22 1006 01/24/2022 0041  WBC 16.8* 33.2* 29.3*  RBC 3.05* 3.67* 3.36*  HGB 11.1* 13.3 12.1  HCT 32.1* 39.2 37.0  MCV 105.2* 106.8* 110.1*  MCH 36.4* 36.2* 36.0*  MCHC 34.6 33.9 32.7  RDW 13.3 13.5 13.5  PLT 171 246 232     Radiology    DG CHEST PORT 1 VIEW  Result Date: 01/26/2022 CLINICAL DATA:  NG tube placement. EXAM: PORTABLE CHEST 1 VIEW COMPARISON:  Earlier same day FINDINGS: 0618 hours. Stable asymmetric elevation right hemidiaphragm with right base collapse/consolidation and small right effusion. Minimal atelectasis noted left base with possible tiny left pleural effusion. The cardio pericardial silhouette is enlarged. NG tube tip is in the stomach with proximal side port below the GE junction. Telemetry leads overlie the chest. IMPRESSION: NG tube tip is in the stomach with proximal side port  below the GE junction. Electronically Signed   By: Misty Stanley M.D.   On: 01/26/2022 06:33   DG CHEST PORT 1 VIEW  Result Date: 01/08/2022 CLINICAL DATA:  NG tube placement. EXAM: PORTABLE CHEST 1 VIEW COMPARISON:  01/10/2022 FINDINGS: 0610 hours. NG tube tip is positioned at the level of the mid esophagus. Asymmetric elevation right hemidiaphragm noted with right base collapse/consolidation and small right pleural effusion. Minimal atelectasis noted left base with tiny left effusion. The cardio pericardial silhouette is enlarged. Bones are diffusely demineralized. Telemetry leads overlie the chest. IMPRESSION: 1. NG tube tip positioned at the level of the mid esophagus. 2. Asymmetric elevation right hemidiaphragm with right base collapse/consolidation and small right pleural effusion. Electronically Signed   By: Misty Stanley M.D.   On: 01/26/2022 06:33   DG Chest Port 1 View  Result Date: 01/10/2022 CLINICAL DATA:  Small bowel disease. Respiratory distress. EXAM: PORTABLE CHEST 1 VIEW COMPARISON:  January 06, 2022 FINDINGS: Enteric catheter curled on itself with tip collimated off the image at the level of the mid gastric body. Enlarged cardiac silhouette. Right pleural effusion and right lower lung field airspace consolidation. Mild atelectasis of the left lower lung. Osseous structures are without acute abnormality. Soft tissues are grossly normal. IMPRESSION: 1. Enteric catheter folded on itself with tip collimated off the image at the level of the mid gastric body. 2. Right pleural effusion and right lower lung field airspace consolidation. Electronically Signed   By: Fidela Salisbury M.D.   On: 01/10/2022 16:33   DG Abd Portable 1V  Result Date: 01/10/2022 CLINICAL DATA:  NG tube placement. EXAM: PORTABLE ABDOMEN - 1 VIEW COMPARISON:  01/10/2022 FINDINGS: Enteric feeding tube has been removed. A nasal/orogastric tube passes well below the diaphragm, tip projecting in  the distal stomach. Dilated  loops of small bowel noted centrally, unchanged from the earlier study. IMPRESSION: Well-positioned nasal/orogastric tube. Electronically Signed   By: Lajean Manes M.D.   On: 01/10/2022 16:32   CT ABDOMEN PELVIS W CONTRAST  Result Date: 01/10/2022 CLINICAL DATA:  Bowel obstruction.  Abdominal pain. EXAM: CT ABDOMEN AND PELVIS WITH CONTRAST TECHNIQUE: Multidetector CT imaging of the abdomen and pelvis was performed using the standard protocol following bolus administration of intravenous contrast. RADIATION DOSE REDUCTION: This exam was performed according to the departmental dose-optimization program which includes automated exposure control, adjustment of the mA and/or kV according to patient size and/or use of iterative reconstruction technique. CONTRAST:  19m OMNIPAQUE IOHEXOL 300 MG/ML  SOLN COMPARISON:  Chest abdomen pelvis CT 01/03/2022 FINDINGS: Lower chest: Collapse/consolidation in the right lower lung persists but is improved since prior study. Small right pleural effusion noted. The cardio pericardial silhouette is enlarged. Hepatobiliary: No suspicious focal abnormality within the liver parenchyma. There is no evidence for gallstones, gallbladder wall thickening, or pericholecystic fluid. No intrahepatic or extrahepatic biliary dilation. Pancreas: No focal mass lesion. No dilatation of the main duct. No intraparenchymal cyst. No peripancreatic edema. Spleen: No splenomegaly. No focal mass lesion. Adrenals/Urinary Tract: No adrenal nodule or mass. Cortical scarring noted in both kidneys. No hydronephrosis tiny hypodensities in both kidneys are too small to characterize but likely benign. Stomach/Bowel: Stomach is markedly distended with fluid. Feeding tube tip is in the antral region adjacent to the pylorus. Duodenum is normally positioned as is the ligament of Treitz. Small bowel loops are fluid-filled and distended up to 3.4 cm diameter. Small bowel loops in the right lower quadrant a completely  decompressed. The terminal ileum is not discretely visible. Cecal tip is in the lateral right lower quadrant on the previous study and is now in the midline abdomen just deep to the umbilicus (image 671/6 There is pericolonic edema/inflammation associated with the cecum and ascending colon. Pneumatosis is noted in the cecum and ascending colon. No wall thickening or pneumatosis in the hepatic flexure, transverse colon, or left colon. Diffuse colonic diverticulosis evident. Vascular/Lymphatic: There is moderate atherosclerotic calcification of the abdominal aorta without aneurysm. Portal vein and superior mesenteric vein are patent. No evidence for portal venous gas. Celiac axis, SMA, and IMA are opacified. There is no gastrohepatic or hepatoduodenal ligament lymphadenopathy. No retroperitoneal or mesenteric lymphadenopathy. No pelvic sidewall lymphadenopathy. Reproductive: Small posterior uterine fibroid evident. There is no adnexal mass. Other: Small volume free fluid is seen in the cul-de-sac in the para colic gutters bilaterally. Musculoskeletal: No worrisome lytic or sclerotic osseous abnormality. Body wall edema noted lower abdomen and pelvis. IMPRESSION: 1. Interval development of apparent pneumatosis involving the cecum and ascending colon. Imaging features concerning for ischemic colitis. While the apparent pneumatosis is best appreciated in segments of colon with adjacent intraluminal fluid, given the adjacent pericolonic edema/inflammation, pneumatosis is favored over gas trapped between intraluminal contents and the mucosal surface. Cecum is mobile and while it was in the lower right lower quadrant on the study from 1 week ago, it is now positioned in the midline upper pelvis/abdomen. No luminal narrowing in the cecum or ascending colon to confirm the presence of a cecal volvulus. 2. Small bowel loops in the right lower quadrant are decompressed while proximal and mid small bowel is fluid-filled and  distended up to 3.4 cm. Although no discrete transition zone is identified, given the markedly decompressed distal small bowel the right lower quadrant, associated small bowel  obstruction is a concern. Inflammatory ileus secondary to the right colonic process would also be a consideration. Right lower quadrant internal hernia not excluded. 3. Marked distention of the stomach with fluid. Tube tip is in the antral region adjacent to the pylorus. 4. Collapse/consolidation in the right lower lung persists but is improved since prior study. 5. Small volume free fluid in the cul-de-sac and para colic gutters bilaterally. 6. Aortic Atherosclerosis (ICD10-I70.0). These results will be called to the ordering clinician or representative by the Radiologist Assistant, and communication documented in the PACS or [employer]. Electronically Signed   By: Misty Stanley M.D.   On: 01/10/2022 14:02   DG Abd 1 View  Result Date: 01/10/2022 CLINICAL DATA:  Abdominal tenderness with distension. EXAM: ABDOMEN - 1 VIEW COMPARISON:  01/08/2022 FINDINGS: Weighted enteric tube tip in the right upper quadrant in the region of the distal stomach or proximal duodenum. There is mild gaseous gastric distension. Mild gaseous distention of small and large bowel in the central abdomen. No evidence of free air on this supine view. Surgical clips in the right upper quadrant. IMPRESSION: Mild gaseous gastric distension. Mild gaseous distention of small and large bowel in the central abdomen, favoring ileus. Electronically Signed   By: Keith Rake M.D.   On: 01/10/2022 02:51     Patient Profile     81 y.o. female with a hx of atrial fib on propranolol 80 mg qd and Xarelto, HTN, asthma, breast CA, anxiety, who is being seen 01/02/2022 for the evaluation of CHF & rapid atrial fib at the request of Dr Marthenia Rolling.  Patient was admitted July 28 with probable pneumonia.  Patient suffered a PEA arrest on July 30 requiring 7 minutes of CPR.   Patient ultimately required chest tube placement for parapneumonic effusion.  Echocardiogram this admission shows ejection fraction 25 to 30%.  The mid/apical segments are akinetic and findings felt consistent with Takotsubo cardiomyopathy.  There was mild left atrial enlargement, moderate right atrial enlargement, moderate mitral regurgitation, mild to moderate tricuspid regurgitation, mild to moderate aortic insufficiency.  She has been encephalopathic as well.  Assessment & Plan    1 permanent atrial fibrillation-heart rate mildly elevated.  This is likely exacerbated by stress of pneumonia and ischemic colitis/SBO.  We will continue IV heparin.  Continue metoprolol.  Will allow heart rate to run higher in the setting.  If necessary could add digoxin or amiodarone later.  2 Takotsubo cardiomyopathy-blood pressure is soft.  We have therefore not added ARB or Entresto.  We will consider later as she is taking oral medications and as blood pressure allows.  She will need follow-up echocardiogram if she improves.  3 ischemic colitis/SBO-surgery following.  No significant abdominal tenderness on examination today.  4 pneumonia/parapneumonic effusion-Per primary service.  Continue antibiotics.  5 valvular heart disease including moderate mitral regurgitation, mild to moderate aortic insufficiency-patient will need follow-up echoes in the future.  6 status post PEA arrest-this was felt secondary to respiratory distress.  For questions or updates, please contact [place] Please consult [email or web address] for contact info under        Signed, Kirk Ruths, MD  01/30/2022, 6:49 AM

## 2022-01-11 NOTE — Progress Notes (Signed)
PT Cancellation Note  Patient Details Name: CHER FRANZONI MRN: [id number] DOB: Jun 11, 1940   Cancelled Treatment:    Reason Eval/Treat Not Completed: Patient at procedure or test/unavailable Pt with Clinically worsening colonic ischemia and to OR for Exp lap and small bowel resection per notes.  Will hold therapy today. Abran Richard, PT Acute Rehab [facility] [phone]   Karlton Lemon 02/01/2022, 10:13 AM

## 2022-01-11 NOTE — Progress Notes (Signed)
Progress Note  Patient: [name] XNA:[id number] DOB: December 05, 1940  DOA: 12/09/2021  DOS: 01/05/2022    Brief hospital course: [name] is a 81 y.o. female with a history of atrial fibrillation on xarelto, HTN, HFpEF, breast CA on letrozole, pulmonary HTN, GAD who presented to the ED initially on 7/28 for shortness of breath, productive cough. She was found to be hypoxic, febrile with RLL pneumonia. She was admitted, placed on IV antibiotics, concern for possible COPD exacerbation, encephalopathy, AFib with RVR, and sustained PEA arrest on 7/30 requiring 7 minutes of CPR, intubation, subsequently transferred to ICU. Had chest tube placed 7/31 for enlarging right parapneumonic exudative effusion, subsequently removed by patient 8/1. 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 Neurology consulted 8/2 for worsening agitation, routine EEG showing diffuse slowing without epileptiform abnormalities, patient required precedex for agitation off and on. Extubated 8/3. Cardiology has been following, titrating GDMT and metoprolol for RVR. With improvement, care transferred to hospitalist service on 8/6. Overnight the patient has developed worsening lactic acidosis, leukocytosis up to 33k, AFib with rising ventricular rate, abdominal distention with concern for ischemic colitis with SBO. Patient made NPO, cortrak removed, NGT inserted, placed to LIWS, zosyn started, and surgery consulted.   Clinical exam worsened, patient becoming hypotensive with extremity mottling and worsening abdominal distention, tenderness, guarding. Heparin stopped, K centra given. PCCM consulted for consideration of transfer to ICU and surgery recommends urgent OR.  Assessment and Plan: Acute hypoxic respiratory failure due to right sided CAP with parapneumonic effusion:  - Continue nasal cannula supplementation, seems improving.  - Cultures remained no growth including pleural fluid, BAL, and blood cultures. Completed  antibiotics directed toward this problem, though leukocytosis had been progressing (was also on solumedrol 7/30 - 8/3).  COPD exacerbation: Seems much improved.  - Continue inhaled bronchodilators, change to levalbuterol given her persistent rate elevation.  - Attempt to avoid further steroids in setting of ischemic bowel  Atrial fibrillation with RVR: Rate has been climbing for past couple days despite augmentation of metoprolol, likely due to worsening ischemic bowel.  - Cardiology recommendations appreciated, unable to give metoprolol enterally per surgery, so change to metoprolol 40m scheduled and monitor response. Avoid CCB.  - Change xarelto to heparin  Ischemic colitis, SBO:  - Signs and symptoms showing progressive worsening, surgery and PCCM met at bedside. Urgent OR pending goals of care discussions. Will remain intubated most likely and transfer to ICU. Pt is hypotensive by BP cuff on LLE, distal mottling noted.  - Pt is clearly at exceedingly high surgical risk. - NG tube inserted, placed to LIWS, continue zosyn.   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   Right breast CA:  - Suggest follow up imaging as pulmonary mass was not excluded on imaging and exudative pleural effusion.  Hypokalemia: Improved, recheck in AM  Subjective: Most distended and tender overnight, AFib w/RVR sustaining, more hypotensive despite IV fluids. Pt unable to remain awake for more than a few seconds this morning. No family at bedside.  Objective: Vitals:   01/10/22 2021 01/10/22 2356 01/23/2022 0400 01/08/2022 0500  BP: (!) 137/112 (!) 141/89 97/67 112/82  Pulse: (!) 135 (!) 132 (!) 117 (!) 127  Resp: (!) 29 (!) 25 (!) 24 (!) 31  Temp: (!) 101.4 F (38.6 C) 98 F (36.7 C)  (!) 101.3 F (38.5 C)  TempSrc: Oral Oral  Rectal  SpO2:  100%   Weight:   69.5 kg   Height:      Gen: Critically ill-appearing  female HEENT: NGT in place w/dark green output. Pulm: Nonlabored tachypnea with supplemental oxygen, no crackles or wheezes. CV: Irregular with rate in 130's, no murmur, rub, or gallop. No JVD, trace dependent edema. GI: Abdomen tender, distended with guarding. Overall significantly worse than exam yesterday evening.  Ext: Warm hands, cold, dry feet. + mottling.  Skin: No other rashes, lesions or ulcers on visualized skin. Neuro: Drowsy, rousable shortly then falls back to sleep. Oriented. MAE.   Data Personally reviewed: CBC: Recent Labs  Lab 01/05/22 0105 01/06/22 0432 01/06/22 0447 01/07/22 0408 01/08/22 0430 01/10/22 1006 01/29/2022 0041  WBC 15.7*  --  17.6* 12.3* 16.8* 33.2* 29.3*  NEUTROABS 13.5*  --   --   --   --  30.9* 25.8*  HGB 11.1*   < > 11.1* 11.1* 11.1* 13.3 12.1  HCT 31.1*   < > 32.4* 31.2* 32.1* 39.2 37.0  MCV 101.3*  --  106.6* 103.7* 105.2* 106.8* 110.1*  PLT 140*  --  156 132* 171 246 232   < > = values in this interval not displayed.   Basic Metabolic Panel: Recent Labs  Lab 01/04/22 1808 01/05/22 0105 01/05/22 1632 01/06/22 4709 01/06/22 0447 01/06/22 2205 01/07/22 0408 01/08/22 0430 01/09/22 0425 01/10/22 0046 01/16/2022 0041 01/07/2022 0608  NA 129* 130*  --    < > 132*  --    < > 136 138 137 139 139  K 3.4* 3.3*  --    < > 3.5  --    < > 3.7 3.4* 4.6 4.6 5.2*  CL 93* 96*  --   --  100  --    < > 101 100 105 100 103  CO2 25 24  --   --  25  --    < > _0 GLUCOSE 143* 192*  --   --  113*  --    < > 137* 172* 134* 87 106*  BUN 15 15  --   --  22  --    < > 26* 30* _1 CREATININE 0.67 0.66  --   --  0.71  --    < > 0.65 0.60 0.63 0.61 0.66  CALCIUM 9.1 9.0  --   --  8.9  --    < > 9.2 9.5 9.7 9.9 10.1  MG 1.6* 1.6* 2.5*  --  2.4  --   --  2.2 2.0 2.0  --   --   PHOS 2.0* 1.9* 3.4  --  4.3 2.0*  --   --   --   --   --   --    < > = values in this interval not displayed.   GFR: Estimated Creatinine Clearance: 50.4 mL/min (by C-G  formula based on SCr of 0.66 mg/dL). Liver Function Tests: Recent Labs  Lab 01/04/22 1808 01/05/22 0105 01/06/22 0447 02/04/2022 0041  AST  --  25 31 55*  ALT  --  21 22 88*  ALKPHOS  --  52 77 130*  BILITOT  --  1.3* 1.1 2.4*  PROT 5.6* 5.3* 5.8* 5.7*  ALBUMIN  --  2.1* 2.3* 1.9*   No results for input(s): "LIPASE", "AMYLASE" in the last 168 hours. No results for input(s): "AMMONIA" in the last 168 hours. Coagulation Profile: No results for input(s): "INR", "PROTIME" in  the last 168 hours.  Cardiac Enzymes: No results for input(s): "CKTOTAL", "CKMB", "CKMBINDEX", "TROPONINI" in the last 168 hours. BNP (last 3 results) No results for input(s): "PROBNP" in the last 8760 hours. HbA1C: No results for input(s): "HGBA1C" in the last 72 hours. CBG: Recent Labs  Lab 01/10/22 2020 01/24/2022 0117 01/31/2022 0215 02/01/2022 0415 01/24/2022 0443  GLUCAP 89 69* 73 60* 88   Lipid Profile: No results for input(s): "CHOL", "HDL", "LDLCALC", "TRIG", "CHOLHDL", "LDLDIRECT" in the last 72 hours. Thyroid Function Tests: No results for input(s): "TSH", "T4TOTAL", "FREET4", "T3FREE", "THYROIDAB" in the last 72 hours. Anemia Panel: No results for input(s): "VITAMINB12", "FOLATE", "FERRITIN", "TIBC", "IRON", "RETICCTPCT" in the last 72 hours. Urine analysis:    Component Value Date/Time   COLORURINE YELLOW 12/28/2020 1641   APPEARANCEUR HAZY (A) 12/28/2020 1641   LABSPEC 1.009 12/28/2020 1641   PHURINE 6.0 12/28/2020 1641   GLUCOSEU NEGATIVE 12/28/2020 1641   HGBUR NEGATIVE 12/28/2020 1641   BILIRUBINUR NEGATIVE 12/28/2020 1641   KETONESUR NEGATIVE 12/28/2020 1641   PROTEINUR NEGATIVE 12/28/2020 1641   UROBILINOGEN 0.2 01/22/2013 1345   NITRITE NEGATIVE 12/28/2020 1641   LEUKOCYTESUR LARGE (A) 12/28/2020 1641   Recent Results (from the past 240 hour(s))  MRSA Next Gen by PCR, Nasal     Status: None   Collection Time: 12/21/2021 10:32 PM  Result Value Ref Range Status   MRSA by PCR Next  Gen NOT DETECTED NOT DETECTED Final    Comment: (NOTE) The GeneXpert MRSA Assay (FDA approved for NASAL specimens only), is one component of a comprehensive MRSA colonization surveillance program. It is not intended to diagnose MRSA infection nor to guide or monitor treatment for MRSA infections. Test performance is not FDA approved in patients less than 70 years old. Performed at [facility], [place] [address] [place], [place] [address]   Culture, blood (Routine X 2) w Reflex to ID Panel     Status: None   Collection Time: 12/27/2021 10:49 PM   Specimen: BLOOD LEFT HAND  Result Value Ref Range Status   Specimen Description BLOOD LEFT HAND  Final   Special Requests   Final    AEROBIC BOTTLE ONLY Blood Culture results may not be optimal due to an inadequate volume of blood received in culture bottles   Culture   Final    NO GROWTH 5 DAYS Performed at [facility], [place] [address] [place], [place] [address]    Report Status 01/06/2022 FINAL  Final  Culture, blood (Routine X 2) w Reflex to ID Panel     Status: None   Collection Time: 12/30/2021 10:49 PM   Specimen: BLOOD LEFT HAND  Result Value Ref Range Status   Specimen Description BLOOD LEFT HAND  Final   Special Requests   Final    BOTTLES DRAWN AEROBIC AND ANAEROBIC Blood Culture adequate volume   Culture   Final    NO GROWTH 5 DAYS Performed at [facility], [place] [address] [place], [place] [address]    Report Status 01/06/2022 FINAL  Final  Urine Culture     Status: None   Collection Time: 01/02/22 10:42 AM   Specimen: Urine, Clean Catch  Result Value Ref Range Status   Specimen Description URINE, CLEAN CATCH  Final   Special Requests Immunocompromised  Final   Culture   Final    NO GROWTH Performed at [facility], [place] [address] [place], [place] [address]  Report Status 01/04/2022 FINAL  Final  Culture, Respiratory w Gram Stain     Status: None   Collection Time: 01/03/22  6:06 PM   Specimen:  Bronchoalveolar Lavage; Respiratory  Result Value Ref Range Status   Specimen Description BRONCHIAL ALVEOLAR LAVAGE  Final   Special Requests NONE  Final   Gram Stain NO WBC SEEN NO ORGANISMS SEEN   Final   Culture   Final    NO GROWTH 2 DAYS Performed at [facility], [address], [place], [place] [address]    Report Status 01/05/2022 FINAL  Final  Body fluid culture w Gram Stain     Status: None   Collection Time: 01/04/22  5:23 PM   Specimen: Pleural Fluid  Result Value Ref Range Status   Specimen Description PLEURAL  Final   Special Requests NONE  Final   Gram Stain   Final    NO SQUAMOUS EPITHELIAL CELLS SEEN RARE WBC SEEN NO ORGANISMS SEEN    Culture   Final    NO GROWTH Performed at [facility], [place] [address], [place], [place] [address]    Report Status 01/08/2022 FINAL  Final  MRSA Next Gen by PCR, Nasal     Status: Abnormal   Collection Time: 01/05/22 10:30 AM   Specimen: Nasal Mucosa; Nasal Swab  Result Value Ref Range Status   MRSA by PCR Next Gen NEGATIVE (A) NOT DETECTED Final    Comment: Performed at [facility], [address] [place], [place] [address]     DG Abd Portable 1V  Result Date: 01/07/2022 CLINICAL DATA:  Ileus. EXAM: PORTABLE ABDOMEN - 1 VIEW COMPARISON:  01/10/2022 FINDINGS: NG tube is been removed in the interval. Similar appearance of diffuse gaseous small bowel and colonic distension. Contrast in the bladder is compatible with recent CT scan. IMPRESSION: Similar appearance of diffuse gaseous small bowel and colonic distension. Interval removal of NG tube. Electronically Signed   By: Misty Stanley M.D.   On: 02/04/2022 07:36   DG CHEST PORT 1 VIEW  Result Date: 02/02/2022 CLINICAL DATA:  NG tube placement. EXAM: PORTABLE CHEST 1 VIEW COMPARISON:  Earlier same day FINDINGS: 0618 hours. Stable asymmetric elevation right hemidiaphragm with right base collapse/consolidation and small right effusion. Minimal atelectasis noted  left base with possible tiny left pleural effusion. The cardio pericardial silhouette is enlarged. NG tube tip is in the stomach with proximal side port below the GE junction. Telemetry leads overlie the chest. IMPRESSION: NG tube tip is in the stomach with proximal side port below the GE junction. Electronically Signed   By: Misty Stanley M.D.   On: 01/05/2022 06:33   DG CHEST PORT 1 VIEW  Result Date: 01/08/2022 CLINICAL DATA:  NG tube placement. EXAM: PORTABLE CHEST 1 VIEW COMPARISON:  01/10/2022 FINDINGS: 0610 hours. NG tube tip is positioned at the level of the mid esophagus. Asymmetric elevation right hemidiaphragm noted with right base collapse/consolidation and small right pleural effusion. Minimal atelectasis noted left base with tiny left effusion. The cardio pericardial silhouette is enlarged. Bones are diffusely demineralized. Telemetry leads overlie the chest. IMPRESSION: 1. NG tube tip positioned at the level of the mid esophagus. 2. Asymmetric elevation right hemidiaphragm with right base collapse/consolidation and small right pleural effusion. Electronically Signed   By: Misty Stanley M.D.   On: 02/01/2022 06:33   DG Chest Port 1 View  Result Date: 01/10/2022 CLINICAL DATA:  Small bowel disease. Respiratory distress. EXAM:  PORTABLE CHEST 1 VIEW COMPARISON:  January 06, 2022 FINDINGS: Enteric catheter curled on itself with tip collimated off the image at the level of the mid gastric body. Enlarged cardiac silhouette. Right pleural effusion and right lower lung field airspace consolidation. Mild atelectasis of the left lower lung. Osseous structures are without acute abnormality. Soft tissues are grossly normal. IMPRESSION: 1. Enteric catheter folded on itself with tip collimated off the image at the level of the mid gastric body. 2. Right pleural effusion and right lower lung field airspace consolidation. Electronically Signed   By: Fidela Salisbury M.D.   On: 01/10/2022 16:33   DG Abd  Portable 1V  Result Date: 01/10/2022 CLINICAL DATA:  NG tube placement. EXAM: PORTABLE ABDOMEN - 1 VIEW COMPARISON:  01/10/2022 FINDINGS: Enteric feeding tube has been removed. A nasal/orogastric tube passes well below the diaphragm, tip projecting in the distal stomach. Dilated loops of small bowel noted centrally, unchanged from the earlier study. IMPRESSION: Well-positioned nasal/orogastric tube. Electronically Signed   By: Lajean Manes M.D.   On: 01/10/2022 16:32   CT ABDOMEN PELVIS W CONTRAST  Result Date: 01/10/2022 CLINICAL DATA:  Bowel obstruction.  Abdominal pain. EXAM: CT ABDOMEN AND PELVIS WITH CONTRAST TECHNIQUE: Multidetector CT imaging of the abdomen and pelvis was performed using the standard protocol following bolus administration of intravenous contrast. RADIATION DOSE REDUCTION: This exam was performed according to the departmental dose-optimization program which includes automated exposure control, adjustment of the mA and/or kV according to patient size and/or use of iterative reconstruction technique. CONTRAST:  8m OMNIPAQUE IOHEXOL 300 MG/ML  SOLN COMPARISON:  Chest abdomen pelvis CT 01/03/2022 FINDINGS: Lower chest: Collapse/consolidation in the right lower lung persists but is improved since prior study. Small right pleural effusion noted. The cardio pericardial silhouette is enlarged. Hepatobiliary: No suspicious focal abnormality within the liver parenchyma. There is no evidence for gallstones, gallbladder wall thickening, or pericholecystic fluid. No intrahepatic or extrahepatic biliary dilation. Pancreas: No focal mass lesion. No dilatation of the main duct. No intraparenchymal cyst. No peripancreatic edema. Spleen: No splenomegaly. No focal mass lesion. Adrenals/Urinary Tract: No adrenal nodule or mass. Cortical scarring noted in both kidneys. No hydronephrosis tiny hypodensities in both kidneys are too small to characterize but likely benign. Stomach/Bowel: Stomach is markedly  distended with fluid. Feeding tube tip is in the antral region adjacent to the pylorus. Duodenum is normally positioned as is the ligament of Treitz. Small bowel loops are fluid-filled and distended up to 3.4 cm diameter. Small bowel loops in the right lower quadrant a completely decompressed. The terminal ileum is not discretely visible. Cecal tip is in the lateral right lower quadrant on the previous study and is now in the midline abdomen just deep to the umbilicus (image 677/[id number] There is pericolonic edema/inflammation associated with the cecum and ascending colon. Pneumatosis is noted in the cecum and ascending colon. No wall thickening or pneumatosis in the hepatic flexure, transverse colon, or left colon. Diffuse colonic diverticulosis evident. Vascular/Lymphatic: There is moderate atherosclerotic calcification of the abdominal aorta without aneurysm. Portal vein and superior mesenteric vein are patent. No evidence for portal venous gas. Celiac axis, SMA, and IMA are opacified. There is no gastrohepatic or hepatoduodenal ligament lymphadenopathy. No retroperitoneal or mesenteric lymphadenopathy. No pelvic sidewall lymphadenopathy. Reproductive: Small posterior uterine fibroid evident. There is no adnexal mass. Other: Small volume free fluid is seen in the cul-de-sac in the para colic gutters bilaterally. Musculoskeletal: No worrisome lytic or sclerotic osseous abnormality. Body wall edema noted  lower abdomen and pelvis. IMPRESSION: 1. Interval development of apparent pneumatosis involving the cecum and ascending colon. Imaging features concerning for ischemic colitis. While the apparent pneumatosis is best appreciated in segments of colon with adjacent intraluminal fluid, given the adjacent pericolonic edema/inflammation, pneumatosis is favored over gas trapped between intraluminal contents and the mucosal surface. Cecum is mobile and while it was in the lower right lower quadrant on the study from 1 week ago,  it is now positioned in the midline upper pelvis/abdomen. No luminal narrowing in the cecum or ascending colon to confirm the presence of a cecal volvulus. 2. Small bowel loops in the right lower quadrant are decompressed while proximal and mid small bowel is fluid-filled and distended up to 3.4 cm. Although no discrete transition zone is identified, given the markedly decompressed distal small bowel the right lower quadrant, associated small bowel obstruction is a concern. Inflammatory ileus secondary to the right colonic process would also be a consideration. Right lower quadrant internal hernia not excluded. 3. Marked distention of the stomach with fluid. Tube tip is in the antral region adjacent to the pylorus. 4. Collapse/consolidation in the right lower lung persists but is improved since prior study. 5. Small volume free fluid in the cul-de-sac and para colic gutters bilaterally. 6. Aortic Atherosclerosis (ICD10-I70.0). These results will be called to the ordering clinician or representative by the Radiologist Assistant, and communication documented in the PACS or [employer]. Electronically Signed   By: Misty Stanley M.D.   On: 01/10/2022 14:02   DG Abd 1 View  Result Date: 01/10/2022 CLINICAL DATA:  Abdominal tenderness with distension. EXAM: ABDOMEN - 1 VIEW COMPARISON:  01/08/2022 FINDINGS: Weighted enteric tube tip in the right upper quadrant in the region of the distal stomach or proximal duodenum. There is mild gaseous gastric distension. Mild gaseous distention of small and large bowel in the central abdomen. No evidence of free air on this supine view. Surgical clips in the right upper quadrant. IMPRESSION: Mild gaseous gastric distension. Mild gaseous distention of small and large bowel in the central abdomen, favoring ileus. Electronically Signed   By: Keith Rake M.D.   On: 01/10/2022 02:51     Family Communication: None at bedside  Disposition: Status is: Inpatient Remains  inpatient appropriate because: Severity of illness Planned Discharge Destination:  TBD  Patrecia Pour, MD 01/30/2022 7:43 AM Page by [email or web address]

## 2022-01-11 NOTE — Transfer of Care (Signed)
Immediate Anesthesia Transfer of Care Note  Patient: [name]  Procedure(s) Performed: EXPLORATORY LAPAROTOMY (Abdomen) COLON RESECTION (Abdomen)  Patient Location: ICU  Anesthesia Type:General  Level of Consciousness: sedated and Patient remains intubated per anesthesia plan  Airway & Oxygen Therapy: Patient remains intubated per anesthesia plan and Patient placed on Ventilator (see vital sign flow sheet for setting)  Post-op Assessment: Report given to RN and Post -op Vital signs reviewed and stable  Post vital signs: Reviewed and stable  Last Vitals:  Vitals Value Taken Time  BP    Temp    Pulse    Resp    SpO2      Last Pain:  Vitals:   01/28/2022 0926  TempSrc: Oral  PainSc:       Patients Stated Pain Goal: 0 (07/[birth year] 8887)  Complications: No notable events documented.

## 2022-01-11 NOTE — Anesthesia Procedure Notes (Addendum)
Procedure Name: Intubation Date/Time: 02/03/2022 10:51 AM  Performed by: Dorann Lodge, CRNAPre-anesthesia Checklist: Patient identified, Emergency Drugs available, Suction available and Patient being monitored Patient Re-evaluated:Patient Re-evaluated prior to induction Oxygen Delivery Method: Circle System Utilized Preoxygenation: Pre-oxygenation with 100% oxygen Induction Type: IV induction, Rapid sequence and Cricoid Pressure applied Laryngoscope Size: Mac and 3 Grade View: Grade I Tube type: Oral Tube size: 8.0 mm Number of attempts: 1 Airway Equipment and Method: Stylet Placement Confirmation: ETT inserted through vocal cords under direct vision, positive ETCO2 and breath sounds checked- equal and bilateral Secured at: 21 cm Tube secured with: Tape Dental Injury: Teeth and Oropharynx as per pre-operative assessment

## 2022-01-11 NOTE — Op Note (Addendum)
01/12/2022  11:36 AM  PATIENT:  [name]  81 y.o. female  PRE-OPERATIVE DIAGNOSIS:  Ischemic Colon  POST-OPERATIVE DIAGNOSIS:  Ischemic Colon with contained perforation  PROCEDURE:  Procedure(s): EXPLORATORY LAPAROTOMY (N/A) COLON RESECTION (N/A)  SURGEON:  Surgeon(s) and Role:    Ralene Ok, MD - Primary  ASSISTANTS: Alferd Apa, PA-C   ANESTHESIA:   general  EBL:  100 mL   BLOOD ADMINISTERED:none  DRAINS: Abthera wound vac   LOCAL MEDICATIONS USED:  NONE  SPECIMEN:  Source of Specimen:  right colon  DISPOSITION OF SPECIMEN:  PATHOLOGY  COUNTS:  YES  TOURNIQUET:  * No tourniquets in log *  DICTATION: .Dragon Dictation Indication procedure: Patient is a 81 year old female who was recently admitted secondary to pneumonia, underwent treatment and had MI.  Patient had required chest compressions.  Subsequently patient began with abdominal pain, bloating, right lower quadrant pain.  Patient was CT scan.  This was significant for possible ischemic bowel.  Patient clinical status began to decline.  Patient was taken back emergently for exploratory laparotomy. Findings: Patient had an ischemic portion of her cecum.  There was a contained perforation with stool in the abdominal cavity/right lower quadrant area.  Patient underwent resection of right colon, stapled ileostomy site in her abdomen left open with ABThera VAC in place.  Details of procedure: After the patient was consented she was taken back to the OR and placed supine position bilateral SCDs in place.  She underwent general endotracheal anesthesia.  Patient was then prepped and draped standard fashion.  Timeout was called and all facts verified.  A midline incision was made.  Cautery used to maintain hemostasis and dissection was taken down linea alba.  This was incised.  Peritoneum was entered bluntly.  The fascia was then lengthened to the skin and incision site.  At this time I was able to  mobilize the right colon medially.  At this time there was significant amount of feculent ascites.  Upon mobilizing the cecum.  It was evident there was a hole in the cecum as this was ischemic.  Large amount of stool began leaking from the perforation site.  At this time the terminal ileum was mobilized.  Mesenteric window was made in 96 GIA stapler was then used to transect the terminal ileum.  I then chose an area proximal to the middle colic artery for transection of the transverse colon.  At this time 86 GIA stapler  was used to transect this area.  The right colon was then medialized colon.  The duodenum was identified and protected.  I then used a LigaSure device to transect the mesentery.  This was sent off to pathology.  Secondary to patient's hemodynamics.  I decided to proceed with leaving her abdomen open with a long Hartman's and a stapled off ileostomy site.  At this time an ABThera VAC and sponge were then placed in the abdomen.  The patient tolerated the procedure.  She was taken to the ICU, intubated, and critical condition.  Upon entering the abdomen (organ space), I encountered feculent peritonitis.  CASE DATA:  Type of patient?: DOW CASE (Surgical Hospitalist [facility])  Status of Case? EMERGENT Add On  Infection Present At Time Of Surgery (PATOS)?  FECULENT PERITONITIS           PLAN OF CARE: Admit to inpatient   PATIENT DISPOSITION:  ICU - intubated and critically ill.   Delay start of Pharmacological VTE agent (>24hrs) due to surgical  blood loss or risk of bleeding: no

## 2022-01-11 NOTE — Progress Notes (Signed)
OT Cancellation Note  Patient Details Name: [name] MRN: [id number] DOB: 03-Aug-1940   Cancelled Treatment:    Reason Eval/Treat Not Completed: Patient at procedure or test/ unavailable. Pt in OR.  [place], OTR/L [facility] [phone] Office [phone]    Almon Register 02/01/2022, 9:15 AM

## 2022-01-11 NOTE — Anesthesia Postprocedure Evaluation (Signed)
Anesthesia Post Note  Patient: [name]  Procedure(s) Performed: EXPLORATORY LAPAROTOMY (Abdomen) COLON RESECTION (Abdomen)     Patient location during evaluation: SICU Anesthesia Type: General Level of consciousness: sedated Pain management: pain level controlled Vital Signs Assessment: post-procedure vital signs reviewed and stable Respiratory status: patient remains intubated per anesthesia plan Cardiovascular status: tachycardic Postop Assessment: no apparent nausea or vomiting Anesthetic complications: no   No notable events documented.  Last Vitals:  Vitals:   01/24/2022 0926 01/12/2022 1210  BP: (!) 109/52   Pulse: (!) 104 (!) 110  Resp: 16 16  Temp: 37.1 C   SpO2: 93% 99%    Last Pain:  Vitals:   01/20/2022 0926  TempSrc: Oral  PainSc:                  Almer Littleton,W. EDMOND

## 2022-01-11 NOTE — Progress Notes (Signed)
SLP Cancellation Note  Patient Details Name: CHANEL MCADAMS MRN: [id number] DOB: 1940-07-22   Cancelled treatment:       Reason Eval/Treat Not Completed:  Patient at procedure or test/ unavailable. Pt currently in OR. Will continue f/u.    Ellwood Dense, [place], [facility] Office Number: [phone]  Acie Fredrickson 01/26/2022, 11:51 AM

## 2022-01-11 NOTE — Progress Notes (Signed)
  Amiodarone Drug - Drug Interaction Consult Note  Recommendations: NO current drug interactions noted  Amiodarone is metabolized by the cytochrome P450 system and therefore has the potential to cause many drug interactions. Amiodarone has an average plasma half-life of 50 days (range 20 to 100 days).   There is potential for drug interactions to occur several weeks or months after stopping treatment and the onset of drug interactions may be slow after initiating amiodarone.   '[]'$  Statins: Increased risk of myopathy. Simvastatin- restrict dose to '20mg'$  daily. Other statins: counsel patients to report any muscle pain or weakness immediately.  '[]'$  Anticoagulants: Amiodarone can increase anticoagulant effect. Consider warfarin dose reduction. Patients should be monitored closely and the dose of anticoagulant altered accordingly, remembering that amiodarone levels take several weeks to stabilize.  '[]'$  Antiepileptics: Amiodarone can increase plasma concentration of phenytoin, the dose should be reduced. Note that small changes in phenytoin dose can result in large changes in levels. Monitor patient and counsel on signs of toxicity.  '[]'$  Beta blockers: increased risk of bradycardia, AV block and myocardial depression. Sotalol - avoid concomitant use.  '[]'$   Calcium channel blockers (diltiazem and verapamil): increased risk of bradycardia, AV block and myocardial depression.  '[]'$   Cyclosporine: Amiodarone increases levels of cyclosporine. Reduced dose of cyclosporine is recommended.  '[]'$  Digoxin dose should be halved when amiodarone is started.  '[]'$  Diuretics: increased risk of cardiotoxicity if hypokalemia occurs.  '[]'$  Oral hypoglycemic agents (glyburide, glipizide, glimepiride): increased risk of hypoglycemia. Patient's glucose levels should be monitored closely when initiating amiodarone therapy.   '[]'$  Drugs that prolong the QT interval:  Torsades de pointes risk may be increased with concurrent use -  avoid if possible.  Monitor QTc, also keep magnesium/potassium WNL if concurrent therapy can't be avoided.  Antibiotics: e.g. fluoroquinolones, erythromycin.  Antiarrhythmics: e.g. quinidine, procainamide, disopyramide, sotalol.  Antipsychotics: e.g. phenothiazines, haloperidol.   Lithium, tricyclic antidepressants, and methadone.   Bonnita Nasuti Pharm.D. CPP, BCPS Clinical Pharmacist [phone] 01/16/2022 2:31 PM

## 2022-01-11 NOTE — Progress Notes (Addendum)
TR[facility] night coverage note  Pt seen and evaluated at bedside.  Pt now febrile with current temp 101.3 rectal.  More somnlent than yesterday when I saw her.  Pt endorses significantly worse abd pain and distention despite the NGT to suction currently.  Pt has been on zosyn since yesterday afternoon.  A: concerned about worsening ischemic bowel in setting of CT AP yesterday afternoon that demonstrated this.  P: 1) checking stat lactate 2) PRN dilaudid for abd pain ordered 3) giving a call to general surgery to let them know about patients worsening abdominal exam / abdominal symptoms, etc.  Spoke with Dr. Dema Severin: Last Xarelto was within 72h (8/5 at 1600), so he wants [place] to give K centra for reversal. Continue heparin gtt for now. He is coming to evaluate patient for possible emergent OR.  CRITICAL CARE Performed by: Etta Quill.   Total critical care time: 60 minutes  Critical care time was exclusive of separately billable procedures and treating other patients.  Critical care was necessary to treat or prevent imminent or life-threatening deterioration.  Critical care was time spent personally by me on the following activities: development of treatment plan with patient and/or surrogate as well as nursing, discussions with consultants, evaluation of patient's response to treatment, examination of patient, obtaining history from patient or surrogate, ordering and performing treatments and interventions, ordering and review of laboratory studies, ordering and review of radiographic studies, pulse oximetry and re-evaluation of patient's condition.

## 2022-01-11 NOTE — Progress Notes (Signed)
[facility] PCCM  Post op visit AFib with RVR CI on SGC  1.9 On vent Abdomen open On propofol, awake  Plan Amiodarone Give fentanyl Add micafungin Add vent order set NPO Going back to OR this week Check coox in a few hours after giving amiodarone and pain control  Discussed with nursing and pharmacy  Updated husband by phone  Additional cc time 33 minutes  Roselie Awkward, MD [place] Pager: [phone] Cell: [phone] After 7:00 pm call Elink  [phone]

## 2022-01-11 NOTE — Progress Notes (Addendum)
[facility] Progress Note     Subjective: CC:  Somnolent but arouses intermittently to touch or loud voice, occasionally answers questions. Reports abd pain.    Objective: Vital signs in last 24 hours: Temp:  [97.8 F (36.6 C)-101.4 F (38.6 C)] 101.3 F (38.5 C) (08/07 0500) Pulse Rate:  [110-135] 127 (08/07 0500) Resp:  [24-37] 31 (08/07 0500) BP: (97-141)/(67-112) 112/82 (08/07 0500) SpO2:  [98 %-100 %] 100 % (08/07 0400) Weight:  [69.5 kg] 69.5 kg (08/07 0400) Last BM Date : 01/06/22  Intake/Output from previous day: 08/06 0701 - 08/07 0700 In: 768.7 [P.O.:200; I.V.:468.7; IV Piggyback:100] Out: 100 [Emesis/NG output:100] Intake/Output this shift: No intake/output data recorded.  PE: Gen:  somnolent ill appearing female Card:  tachycardic Pulm:  rhonchorus breath sounds  Abd: Soft, protuberant - especially lower abdomen, peritonitic -- equisitely tender in RLQ and palpation of left hemiabdomen causes pain in RLQ.  Skin: warm and dry, no rashes  Psych: A&Ox3   Lab Results:  Recent Labs    01/10/22 1006 02/01/2022 0041  WBC 33.2* 29.3*  HGB 13.3 12.1  HCT 39.2 37.0  PLT 246 232   BMET Recent Labs    01/07/2022 0041 01/12/2022 0608  NA 139 139  K 4.6 5.2*  CL 100 103  CO2 28 27  GLUCOSE 87 106*  BUN 16 16  CREATININE 0.61 0.66  CALCIUM 9.9 10.1   PT/INR No results for input(s): "LABPROT", "INR" in the last 72 hours. CMP     Component Value Date/Time   NA 139 01/24/2022 0608   NA 139 11/18/2021 1432   NA 134 (L) 04/25/2017 1216   K 5.2 (H) 02/02/2022 0608   K 4.3 04/25/2017 1216   CL 103 01/29/2022 0608   CO2 27 01/14/2022 0608   CO2 29 04/25/2017 1216   GLUCOSE 106 (H) 01/12/2022 0608   GLUCOSE 85 04/25/2017 1216   BUN 16 01/10/2022 0608   BUN 10 11/18/2021 1432   BUN 14.4 04/25/2017 1216   CREATININE 0.66 01/21/2022 0608   CREATININE 0.72 01/17/2018 1347   CREATININE 0.7 04/25/2017 1216   CALCIUM 10.1 01/10/2022 0608    CALCIUM 10.0 04/25/2017 1216   PROT 5.7 (L) 01/12/2022 0041   PROT 7.2 04/25/2017 1216   ALBUMIN 1.9 (L) 02/03/2022 0041   ALBUMIN 3.7 04/25/2017 1216   AST 55 (H) 01/31/2022 0041   AST 20 01/17/2018 1347   AST 21 04/25/2017 1216   ALT 88 (H) 01/17/2022 0041   ALT 10 01/17/2018 1347   ALT 10 04/25/2017 1216   ALKPHOS 130 (H) 01/06/2022 0041   ALKPHOS 75 04/25/2017 1216   BILITOT 2.4 (H) 01/08/2022 0041   BILITOT 0.5 01/17/2018 1347   BILITOT 0.65 04/25/2017 1216   GFRNONAA >60 01/09/2022 0608   GFRNONAA >60 01/17/2018 1347   GFRAA 90 04/24/2020 1235   GFRAA >60 01/17/2018 1347   Lipase     Component Value Date/Time   LIPASE 77 07/28/2008 1630       Studies/Results: DG Abd Portable 1V  Result Date: 01/30/2022 CLINICAL DATA:  Ileus. EXAM: PORTABLE ABDOMEN - 1 VIEW COMPARISON:  01/10/2022 FINDINGS: NG tube is been removed in the interval. Similar appearance of diffuse gaseous small bowel and colonic distension. Contrast in the bladder is compatible with recent CT scan. IMPRESSION: Similar appearance of diffuse gaseous small bowel and colonic distension. Interval removal of NG tube. Electronically Signed   By: Misty Stanley M.D.   On: 01/21/2022 07:36  DG CHEST PORT 1 VIEW  Result Date: 01/17/2022 CLINICAL DATA:  NG tube placement. EXAM: PORTABLE CHEST 1 VIEW COMPARISON:  Earlier same day FINDINGS: 0618 hours. Stable asymmetric elevation right hemidiaphragm with right base collapse/consolidation and small right effusion. Minimal atelectasis noted left base with possible tiny left pleural effusion. The cardio pericardial silhouette is enlarged. NG tube tip is in the stomach with proximal side port below the GE junction. Telemetry leads overlie the chest. IMPRESSION: NG tube tip is in the stomach with proximal side port below the GE junction. Electronically Signed   By: Misty Stanley M.D.   On: 01/12/2022 06:33   DG CHEST PORT 1 VIEW  Result Date: 01/23/2022 CLINICAL DATA:  NG tube  placement. EXAM: PORTABLE CHEST 1 VIEW COMPARISON:  01/10/2022 FINDINGS: 0610 hours. NG tube tip is positioned at the level of the mid esophagus. Asymmetric elevation right hemidiaphragm noted with right base collapse/consolidation and small right pleural effusion. Minimal atelectasis noted left base with tiny left effusion. The cardio pericardial silhouette is enlarged. Bones are diffusely demineralized. Telemetry leads overlie the chest. IMPRESSION: 1. NG tube tip positioned at the level of the mid esophagus. 2. Asymmetric elevation right hemidiaphragm with right base collapse/consolidation and small right pleural effusion. Electronically Signed   By: Misty Stanley M.D.   On: 01/10/2022 06:33   DG Chest Port 1 View  Result Date: 01/10/2022 CLINICAL DATA:  Small bowel disease. Respiratory distress. EXAM: PORTABLE CHEST 1 VIEW COMPARISON:  January 06, 2022 FINDINGS: Enteric catheter curled on itself with tip collimated off the image at the level of the mid gastric body. Enlarged cardiac silhouette. Right pleural effusion and right lower lung field airspace consolidation. Mild atelectasis of the left lower lung. Osseous structures are without acute abnormality. Soft tissues are grossly normal. IMPRESSION: 1. Enteric catheter folded on itself with tip collimated off the image at the level of the mid gastric body. 2. Right pleural effusion and right lower lung field airspace consolidation. Electronically Signed   By: Fidela Salisbury M.D.   On: 01/10/2022 16:33   DG Abd Portable 1V  Result Date: 01/10/2022 CLINICAL DATA:  NG tube placement. EXAM: PORTABLE ABDOMEN - 1 VIEW COMPARISON:  01/10/2022 FINDINGS: Enteric feeding tube has been removed. A nasal/orogastric tube passes well below the diaphragm, tip projecting in the distal stomach. Dilated loops of small bowel noted centrally, unchanged from the earlier study. IMPRESSION: Well-positioned nasal/orogastric tube. Electronically Signed   By: Lajean Manes M.D.    On: 01/10/2022 16:32   CT ABDOMEN PELVIS W CONTRAST  Result Date: 01/10/2022 CLINICAL DATA:  Bowel obstruction.  Abdominal pain. EXAM: CT ABDOMEN AND PELVIS WITH CONTRAST TECHNIQUE: Multidetector CT imaging of the abdomen and pelvis was performed using the standard protocol following bolus administration of intravenous contrast. RADIATION DOSE REDUCTION: This exam was performed according to the departmental dose-optimization program which includes automated exposure control, adjustment of the mA and/or kV according to patient size and/or use of iterative reconstruction technique. CONTRAST:  34m OMNIPAQUE IOHEXOL 300 MG/ML  SOLN COMPARISON:  Chest abdomen pelvis CT 01/03/2022 FINDINGS: Lower chest: Collapse/consolidation in the right lower lung persists but is improved since prior study. Small right pleural effusion noted. The cardio pericardial silhouette is enlarged. Hepatobiliary: No suspicious focal abnormality within the liver parenchyma. There is no evidence for gallstones, gallbladder wall thickening, or pericholecystic fluid. No intrahepatic or extrahepatic biliary dilation. Pancreas: No focal mass lesion. No dilatation of the main duct. No intraparenchymal cyst. No peripancreatic edema. Spleen:  No splenomegaly. No focal mass lesion. Adrenals/Urinary Tract: No adrenal nodule or mass. Cortical scarring noted in both kidneys. No hydronephrosis tiny hypodensities in both kidneys are too small to characterize but likely benign. Stomach/Bowel: Stomach is markedly distended with fluid. Feeding tube tip is in the antral region adjacent to the pylorus. Duodenum is normally positioned as is the ligament of Treitz. Small bowel loops are fluid-filled and distended up to 3.4 cm diameter. Small bowel loops in the right lower quadrant a completely decompressed. The terminal ileum is not discretely visible. Cecal tip is in the lateral right lower quadrant on the previous study and is now in the midline abdomen just  deep to the umbilicus (image 69/6. There is pericolonic edema/inflammation associated with the cecum and ascending colon. Pneumatosis is noted in the cecum and ascending colon. No wall thickening or pneumatosis in the hepatic flexure, transverse colon, or left colon. Diffuse colonic diverticulosis evident. Vascular/Lymphatic: There is moderate atherosclerotic calcification of the abdominal aorta without aneurysm. Portal vein and superior mesenteric vein are patent. No evidence for portal venous gas. Celiac axis, SMA, and IMA are opacified. There is no gastrohepatic or hepatoduodenal ligament lymphadenopathy. No retroperitoneal or mesenteric lymphadenopathy. No pelvic sidewall lymphadenopathy. Reproductive: Small posterior uterine fibroid evident. There is no adnexal mass. Other: Small volume free fluid is seen in the cul-de-sac in the para colic gutters bilaterally. Musculoskeletal: No worrisome lytic or sclerotic osseous abnormality. Body wall edema noted lower abdomen and pelvis. IMPRESSION: 1. Interval development of apparent pneumatosis involving the cecum and ascending colon. Imaging features concerning for ischemic colitis. While the apparent pneumatosis is best appreciated in segments of colon with adjacent intraluminal fluid, given the adjacent pericolonic edema/inflammation, pneumatosis is favored over gas trapped between intraluminal contents and the mucosal surface. Cecum is mobile and while it was in the lower right lower quadrant on the study from 1 week ago, it is now positioned in the midline upper pelvis/abdomen. No luminal narrowing in the cecum or ascending colon to confirm the presence of a cecal volvulus. 2. Small bowel loops in the right lower quadrant are decompressed while proximal and mid small bowel is fluid-filled and distended up to 3.4 cm. Although no discrete transition zone is identified, given the markedly decompressed distal small bowel the right lower quadrant, associated small bowel  obstruction is a concern. Inflammatory ileus secondary to the right colonic process would also be a consideration. Right lower quadrant internal hernia not excluded. 3. Marked distention of the stomach with fluid. Tube tip is in the antral region adjacent to the pylorus. 4. Collapse/consolidation in the right lower lung persists but is improved since prior study. 5. Small volume free fluid in the cul-de-sac and para colic gutters bilaterally. 6. Aortic Atherosclerosis (ICD10-I70.0). These results will be called to the ordering clinician or representative by the Radiologist Assistant, and communication documented in the PACS or [employer]. Electronically Signed   By: Misty Stanley M.D.   On: 01/10/2022 14:02   DG Abd 1 View  Result Date: 01/10/2022 CLINICAL DATA:  Abdominal tenderness with distension. EXAM: ABDOMEN - 1 VIEW COMPARISON:  01/08/2022 FINDINGS: Weighted enteric tube tip in the right upper quadrant in the region of the distal stomach or proximal duodenum. There is mild gaseous gastric distension. Mild gaseous distention of small and large bowel in the central abdomen. No evidence of free air on this supine view. Surgical clips in the right upper quadrant. IMPRESSION: Mild gaseous gastric distension. Mild gaseous distention of small and large  bowel in the central abdomen, favoring ileus. Electronically Signed   By: Keith Rake M.D.   On: 01/10/2022 02:51    Anti-infectives: Anti-infectives (From admission, onward)    Start     Dose/Rate Route Frequency Ordered Stop   01/10/22 1515  piperacillin-tazobactam (ZOSYN) IVPB 3.375 g        3.375 g 12.5 mL/hr over 240 Minutes Intravenous Every 8 hours 01/10/22 1427     01/04/22 2000  vancomycin (VANCOCIN) IVPB 1000 mg/200 mL premix  Status:  Discontinued        1,000 mg 200 mL/hr over 60 Minutes Intravenous Every 24 hours 01/03/22 1910 01/05/22 1321   01/03/22 2200  ceFEPIme (MAXIPIME) 2 g in sodium chloride 0.9 % 100 mL IVPB        2  g 200 mL/hr over 30 Minutes Intravenous Every 12 hours 01/03/22 1910 01/08/22 2209   01/03/22 1930  vancomycin (VANCOREADY) IVPB 1250 mg/250 mL        1,250 mg 166.7 mL/hr over 90 Minutes Intravenous  Once 01/03/22 1910 01/03/22 2219   01/02/22 2100  azithromycin (ZITHROMAX) 500 mg in sodium chloride 0.9 % 250 mL IVPB        500 mg 250 mL/hr  Intravenous Every 24 hours 01/02/2022 2248 01/06/22 2149   01/02/22 2000  cefTRIAXone (ROCEPHIN) 2 g in sodium chloride 0.9 % 100 mL IVPB  Status:  Discontinued        2 g 200 mL/hr over 30 Minutes Intravenous Every 24 hours 12/30/2021 2248 01/03/22 1733   12/14/2021 2030  cefTRIAXone (ROCEPHIN) 1 g in sodium chloride 0.9 % 100 mL IVPB        1 g 200 mL/hr over 30 Minutes Intravenous  Once 12/29/2021 2025 01/04/2022 2141   12/17/2021 2030  azithromycin (ZITHROMAX) 500 mg in sodium chloride 0.9 % 250 mL IVPB        500 mg 250 mL/hr over 60 Minutes Intravenous  Once 12/06/2021 2025 12/27/2021 2312        Assessment/Plan  Cecal pneumatosis now with peritonitis Unfortunately her exam has worsened in the last 24 hours and I do think she has true colonic ischemia. Given her recent cardiac arrest, a.fib, COPD, recent steroid use, and worsening respiratory failure she is high risk for surgery. See below ACS risk calculator for emergent laparotomy. I have called her husband, Christin Moline, and he is on his way to the hospital and would like to further discuss operative vs non-operative management in person. Myself and/or Dr. Rosendo Gros will plan to meet with him upon his arrival.    LOS: 10 days    I reviewed nursing notes, Consultant CCM notes, hospitalist notes, last 24 h vitals and pain scores, last 48 h intake and output, last 24 h labs and trends, and last 24 h imaging results.  Obie Dredge, PA-C [facility] Please see Amion for pager number during day hours 7:00am-4:30pm    I d/w The patient's husband that she is at extremely high risk of  post op complication and death.  I did mention that there is a high likelihood that she would not make it through the surgery.  He wants to take that chance.  I did d/w him that she could undergo palliative care and pain mgmt. He denied wanted to do this.  We will proceed to the OR for ex lap and bowel resection.

## 2022-01-11 NOTE — Progress Notes (Signed)
Pt has returned from OR.She had a contained perforation with copious stool in the abdominal cavity / RLQ area.   Patient underwent resection of right colon, stapled ileostomy site in her abdomen left open with ABThera VAC in place. She returned to the ICU with Levophed infusing at 4 mcg, vasopressin at 0.03 and maintenance IVF.  CVP is 6-7. BP is stable and nursing are weaning Levo. There was minimal blood loss in the OR. She received 700 cc's of LR during the case. She does have a Right IJ Gordy Councilman with a Cordis which is being used for Levo and Vaso.  Current Vent setting are 40%, Rate of 16, TV of 360 and 5 PEEP.  Minimal UO Plan ABG now  Lactate to assess for fluid needs CBC, BMET , Mag at 1400 We will continue to follow closely I updated the patient's husband. He has gone home to let out his dogs and will be back in about an hour.   Magdalen Spatz, MSN, AGACNP-BC [place] See Amion for personal pager PCCM on call pager [phone]

## 2022-01-11 NOTE — Anesthesia Preprocedure Evaluation (Addendum)
Anesthesia Evaluation  Patient identified by MRN, date of birth, ID band Patient unresponsive    Reviewed: Allergy & Precautions, H&P , NPO status , Patient's Chart, lab work & pertinent test results  Airway Mallampati: Unable to assess       Dental no notable dental hx. (+) Teeth Intact, Dental Advisory Given   Pulmonary asthma ,    Pulmonary exam normal breath sounds clear to auscultation       Cardiovascular hypertension, Pt. on medications +CHF  + dysrhythmias Atrial Fibrillation  Rhythm:Irregular Rate:Tachycardia     Neuro/Psych Anxiety negative neurological ROS     GI/Hepatic Neg liver ROS, GERD  Medicated,  Endo/Other  negative endocrine ROS  Renal/GU negative Renal ROS  negative genitourinary   Musculoskeletal  (+) Arthritis , Osteoarthritis,    Abdominal   Peds  Hematology  (+) Blood dyscrasia, anemia ,   Anesthesia Other Findings   Reproductive/Obstetrics negative OB ROS                            Anesthesia Physical Anesthesia Plan  ASA: 4  Anesthesia Plan: General   Post-op Pain Management: Tylenol PO (pre-op)*   Induction: Intravenous  PONV Risk Score and Plan: 4 or greater and Ondansetron, Dexamethasone and Treatment may vary due to age or medical condition  Airway Management Planned: Oral ETT  Additional Equipment: Arterial line, CVP, PA Cath and Ultrasound Guidance Line Placement  Intra-op Plan:   Post-operative Plan: Post-operative intubation/ventilation  Informed Consent: I have reviewed the patients History and Physical, chart, labs and discussed the procedure including the risks, benefits and alternatives for the proposed anesthesia with the patient or authorized representative who has indicated his/her understanding and acceptance.     Dental advisory given  Plan Discussed with: CRNA  Anesthesia Plan Comments:        Anesthesia Quick  Evaluation

## 2022-01-11 NOTE — NC FL2 (Addendum)
[place] LEVEL OF CARE SCREENING TOOL     IDENTIFICATION  Patient Name: [name] Birthdate: Jun 04, 1941 Sex: female Admission Date (Current Location): 12/14/2021  [facility] and [place] Number:  Herbalist and Address:  [facility] 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[facility])    Takotsubo cardiomyopathy    Cardiac arrest ([place])    Acute respiratory failure with hypoxia ([place])    Pneumonia of right lower lobe due to infectious organism    CAP (community acquired pneumonia) 12/19/2021   A-fib ([place]) 12/29/2020   Asthma, chronic, unspecified asthma severity, with acute exacerbation 12/29/2020   GAD (generalized anxiety disorder) 12/29/2020   History of breast cancer 12/29/2020   GERD (gastroesophageal reflux disease) 12/29/2020   Acute lower UTI 12/29/2020   Lactic acid increased 12/29/2020   Syncope 12/28/2020   Hypotension 12/28/2020   Hyponatremia 12/28/2020   History of breast cancer 06/26/2018   Genetic testing 12/24/2016   Persistent atrial fibrillation ([place]) 12/16/2016   Long term current use of anticoagulant therapy 12/16/2016   Hypertensive heart disease without CHF    Family history of breast cancer    Melanoma ([place])    Malignant neoplasm of upper-inner quadrant of right breast in female, estrogen receptor positive ([place]) 11/19/2016   OA (osteoarthritis) of knee 01/29/2013    Orientation RESPIRATION BLADDER Height & Weight     Self  Nasal Cannula 4 liters  Incontinent  (Urethral Catheter) Weight: 153 lb 3.5 oz (69.5 kg) Height:  5' (152.4 cm)  BEHAVIORAL SYMPTOMS/MOOD NEUROLOGICAL BOWEL NUTRITION STATUS      Continent Diet (Please see discharge summary)  AMBULATORY STATUS COMMUNICATION OF NEEDS Skin   Extensive Assist Verbally Other (Comment) (Appropriate for ethnicity,cyanosis,dry,ecchymosis,arm,bilateral,non-tenting)                       Personal Care Assistance Level of Assistance  Bathing, Feeding, Dressing Bathing Assistance: Maximum assistance Feeding assistance: Independent (NG tube) Dressing Assistance: Maximum assistance     Functional Limitations Info  Sight, Hearing, Speech Sight Info: Adequate (WDL) Hearing Info: Adequate (WDL) Speech Info: Adequate (WDL)    [place]  PT (By licensed PT), OT (By licensed OT)     PT Frequency: 5x min weekly OT Frequency: 5x min weekly            Contractures Contractures Info: Not present    Additional Factors Info  Code Status, Allergies, Insulin Sliding Scale Code Status Info: FULL Allergies Info: Chlorhexidine Gluconate,Doxycycline,Codeine,Doxycycline,Nsaids,Nsaids,Tylenol (acetaminophen),Adhesive (tape),Chlorhexidine,Codeine,Tylenol (acetaminophen)   Insulin Sliding Scale Info: insulin aspart (novoLOG) injection 0-9 Units every 4 hours       Current Medications (01/24/2022):  This is the current hospital active medication list Current Facility-Administered Medications  Medication Dose Route Frequency Provider Last Rate Last Admin   0.9 %  sodium chloride infusion  10 mL/hr Intravenous Once Dorann Lodge, CRNA 10 mL/hr at 01/05/2022 1400 Infusion Verify at 02/04/2022 1400   0.9 %  sodium chloride  infusion   Intravenous PRN Juanito Doom, MD 10 mL/hr at 01/22/2022 1404 New Bag at 02/02/2022 1404   acetaminophen (TYLENOL) suppository 650 mg  650 mg Rectal Q6H PRN Estill Cotta, NP   650 mg at 01/08/2022 0525   amiodarone (NEXTERONE PREMIX) 360-4.14  MG/200ML-% (1.8 mg/mL) IV infusion  60 mg/hr Intravenous Continuous Simonne Maffucci B, MD 33.3 mL/hr at 01/12/2022 1440 60 mg/hr at 01/17/2022 1440   Followed by   amiodarone (NEXTERONE PREMIX) 360-4.14 MG/200ML-% (1.8 mg/mL) IV infusion  30 mg/hr Intravenous Continuous Juanito Doom, MD       arformoterol (BROVANA) nebulizer solution 15 mcg  15 mcg Nebulization BID Candee Furbish, MD   15 mcg at 01/23/2022 0812   budesonide (PULMICORT) nebulizer solution 0.25 mg  0.25 mg Nebulization BID Dana Allan I, MD   0.25 mg at 02/02/2022 7867   chlorhexidine (PERIDEX) 0.12 % solution            dextrose 5 %-0.45 % sodium chloride infusion   Intravenous Continuous Etta Quill, DO   Stopped at 02/01/2022 0650   dextrose 5 %-0.45 % sodium chloride infusion   Intravenous Continuous Magdalen Spatz, NP 100 mL/hr at 01/19/2022 1400 Infusion Verify at 01/16/2022 1400   dextrose 50 % solution 50 mL  50 mL Intravenous Once Patrecia Pour, MD       docusate (COLACE) 50 MG/5ML liquid 100 mg  100 mg Per Tube BID PRN Magdalen Spatz, NP       docusate (COLACE) 50 MG/5ML liquid 100 mg  100 mg Per Tube BID Magdalen Spatz, NP       famotidine (PEPCID) IVPB 20 mg premix  20 mg Intravenous Q12H Magdalen Spatz, NP       fentaNYL (SUBLIMAZE) injection 25 mcg  25 mcg Intravenous Q15 min PRN Juanito Doom, MD       fentaNYL (SUBLIMAZE) injection 25-100 mcg  25-100 mcg Intravenous Q30 min PRN Juanito Doom, MD   50 mcg at 01/05/2022 1428   HYDROmorphone (DILAUDID) injection 0.5-1 mg  0.5-1 mg Intravenous Q4H PRN Etta Quill, DO   0.5 mg at 01/19/2022 0525   insulin aspart (novoLOG) injection 0-9 Units  0-9 Units Subcutaneous Q4H Candee Furbish, MD   1 Units at 01/10/22 0418   lactated ringers bolus 1,000 mL  1,000 mL Intravenous Once Magdalen Spatz, NP       levalbuterol Penne Lash) nebulizer solution 0.63 mg  0.63 mg Nebulization Q3H PRN Patrecia Pour, MD       micafungin [facility]) 100 mg in sodium chloride 0.9 %  100 mL IVPB  100 mg Intravenous Q24H Simonne Maffucci B, MD       norepinephrine (LEVOPHED) '4mg'$  in 224m (0.016 mg/mL) premix infusion  0-40 mcg/min Intravenous Titrated GMagdalen Spatz NP 7.5 mL/hr at 01/22/2022 1400 2 mcg/min at 01/10/2022 1400   ondansetron (ZOFRAN) injection 4 mg  4 mg Intravenous Q6H PRN GMagdalen Spatz NP       Oral care mouth rinse  15 mL Mouth Rinse Q2H MJuanito Doom MD       Oral care mouth rinse  15 mL Mouth Rinse PRN MJuanito Doom MD       pantoprazole sodium (PROTONIX) 40 mg/20 mL oral suspension 40 mg  40 mg Per Tube Daily GMagdalen Spatz NP       piperacillin-tazobactam (ZOSYN) IVPB 3.375 g  3.375 g Intravenous Q8H Wise,  Nason S, RPH 12.5 mL/hr at 01/19/2022 1404 3.375 g at 01/12/2022 1404   polyethylene glycol (MIRALAX / GLYCOLAX) packet 17 g  17 g Per Tube Daily PRN Magdalen Spatz, NP       polyethylene glycol (MIRALAX / GLYCOLAX) packet 17 g  17 g Per Tube Daily Magdalen Spatz, NP       propofol (DIPRIVAN) 1000 MG/100ML infusion  0-50 mcg/kg/min Intravenous Continuous Magdalen Spatz, NP 4.17 mL/hr at 01/19/2022 1400 10 mcg/kg/min at 01/17/2022 1400   revefenacin (YUPELRI) nebulizer solution 175 mcg  175 mcg Nebulization Daily Candee Furbish, MD   175 mcg at 01/09/2022 6811   vasopressin (PITRESSIN) 20 Units in sodium chloride 0.9 % 100 mL infusion-*FOR SHOCK*  0-0.03 Units/min Intravenous Continuous Wyssbrod, Adrienne L, CRNA 9 mL/hr at 01/14/2022 1400 0.03 Units/min at 01/12/2022 1400     Discharge Medications: Please see discharge summary for a list of discharge medications.  Relevant Imaging Results:  Relevant Lab Results:   Additional Information [phone], Both Covid Vaccines and 3 boosters  Milas Gain, LCSWA

## 2022-01-11 NOTE — Progress Notes (Cosign Needed Addendum)
RE: [name]. [name]  Date of Birth: 1940/06/16  Date: 01/12/2022  To Whom It May Concern:  Please be advised that the above-named patient will require a short-term nursing home stay - anticipated 30 days or less for rehabilitation and strengthening. The plan is for return home.

## 2022-01-11 NOTE — Progress Notes (Signed)
Called to bedside for abdominal distension , RLQ abdominal pain.Hypotension , a fib with RVR.  Plan is for OR then ICU. Will fluid bolus with 1 L LR now as patient is mottled peripherally and BP cuff is on calf so ? Accuracy.  Will need another liter once additional IV access to meet the 30 cc per KG . Will admit to ICU post op, PCCM to manage

## 2022-01-11 NOTE — Plan of Care (Signed)

## 2022-01-11 NOTE — Anesthesia Procedure Notes (Signed)
Central Venous Catheter Insertion Performed by: Roderic Palau, MD, anesthesiologist Start/End08/22/2023 9:50 AM, 01/26/2022 10:05 AM Patient location: Pre-op. Preanesthetic checklist: patient identified, IV checked, site marked, risks and benefits discussed, surgical consent, monitors and equipment checked, pre-op evaluation, timeout performed and anesthesia consent Position: Trendelenburg Lidocaine 1% used for infiltration and patient sedated Hand hygiene performed , maximum sterile barriers used  and Seldinger technique used Catheter size: 8.5 Fr Total catheter length 10. Central line was placed.Sheath introducer Procedure performed using ultrasound guided technique. Ultrasound Notes:anatomy identified, needle tip was noted to be adjacent to the nerve/plexus identified, no ultrasound evidence of intravascular and/or intraneural injection and image(s) printed for medical record Attempts: 1 Following insertion, line sutured, dressing applied and Biopatch. Post procedure assessment: blood return through all ports, free fluid flow and no air  Patient tolerated the procedure well with no immediate complications.

## 2022-01-11 NOTE — TOC Progression Note (Addendum)
Transition of Care [facility]) - Progression Note    Patient Details  Name: [name] MRN: [id number] Date of Birth: 1940/10/31  Transition of Care [facility]) CM/SW [place], [place] Phone Number: 01/10/2022, 3:23 PM  Clinical Narrative:     CSW following for SNF placement. Patient currently on Vent.CSW awaiting 30 day note to be signed by MD then CSW will send over clinicals to [facility] must for review. Patients passr currently pending. CSW will continue to follow and assist with patients dc planning needs.  Expected Discharge Plan: [place] Barriers to Discharge: Continued Medical Work up  Expected Discharge Plan and Services Expected Discharge Plan: [place] In-house Referral: Clinical Social Work Discharge Planning Services: CM Consult Post Acute Care Choice: [place] arrangements for the past 2 months: Single Family Home                                       Social Determinants of Health (SDOH) Interventions    Readmission Risk Interventions     No data to display

## 2022-01-11 NOTE — Consult Note (Signed)
NAME:  [name], MRN:  [id number], DOB:  [birth year], LOS: 32 ADMISSION DATE:  12/23/2021, CONSULTATION DATE:  02/02/2022 REFERRING MD:  Bonner Puna, CHIEF COMPLAINT:  Hypotension, abdominal distention    History of Present Illness:  81 year old woman w/ hx of Afib on xarelto who presented with R sided pleurisy, cough, fevers.  Found to have R sided pneumonia, new hypoxemia, afib/RVR, and admitted to progressive.  Also tx empirically for possible COPD exacerbation.  Today worsening SOB then found pulseless.  7 min CPR, PEA.  Some purposeful movement after arrest. She remained in ICU,on precedex, she  improved and was transitioned to Progressive care where she developed abdominal distention with what was thought to be illeus on 8/6. Surgery were consulted 8/6,  ct scan c/w ischemic colitis, trial of non-operative therapy was started , NG to suction, Xarelto was held and IV heparin gtt was started , antibiotics and NPO. Lactate was 2.9. The morning of 8/7. Worsening abdominal distention , continued elevated WBC, hypotension and mottling to her extremities. Clinical exam consistent with true colonic ischemia. Surgery evaluated and plan is for surgery after discussion with husband to further discuss operative vs non-operative management in person.   Pertinent  Medical History  Breast ca on letrozole Afib on [place] Hospital Events: Including procedures, antibiotic start and stop dates in addition to other pertinent events   7/28 admit, Ceftriaxone/Azithro 7/29 last dose xeralto  7/30 IHCA x 7 min, Ceftriaxone>Cefepime 7/31 Chest tube placed, heparin gtt started 8/1 patient remove chest tube.  Head CT obtained >negative.  Severely agitated overnight.  Vancomycin stopped.  Azithromycin stopped. 8/6 Transferred to [facility] 8/7 Called CCM back for abd, Distention , hypotension and a fib with RVR, plan is for  urgent OR pending goals of care discussion then ICU post op if husband is in  agreement Interim History / Subjective:  Currently hypotensive with mottled feet and toes. Distended abdomen , in a fib with RVR.  On the way to the OR per surgery Lactate pending , was 2.8 on 8/6 She is net negative 1 L  K 5.2, Creatinine 0.66 WBC 29.3/ HGB 12.1, platelets 232, APTT 67 She received K [facility] 8/7 am , heparin gtt ws stopped at 0753 Last DOAC dose  was 8/5 pm dose    Objective   Blood pressure 112/82, pulse (!) 127, temperature (!) 101.3 F (38.5 C), temperature source Rectal, resp. rate (!) 31, height 5' (1.524 m), weight 69.5 kg, SpO2 99 %.        Intake/Output Summary (Last 24 hours) at 01/17/2022 0819 Last data filed at 01/15/2022 0600 Gross per 24 hour  Intake 568.72 ml  Output 100 ml  Net 468.72 ml   Filed Weights   01/07/22 0404 01/08/22 0500 01/08/2022 0400  Weight: 66.7 kg 67 kg 69.5 kg    Examination: General:  Awakens to Arousal, confused, elderly female wearing nasal oxygen  HENT: No LAD, No JVD, MM pink and dry Lungs:  Bilateral chest excursion , Coarse breath sounds , diminished per bases , thick oral secretions , cough is weak Cardiovascular:  S1, S1, Irr, A fib per tele  Abdomen:  Distended, Tender, Guarding , especially RLQ, Tympanic decreased bowel Sounds, Dark bilious drainage from NG Extremities:  Cool to touch, Mottled toes and feet with cap refill > 3 seconds Neuro:  Arouses to painful stimuli, Follows simple commands, responds to call of name GU:  Concentrated amber urine  Resolved Hospital Problem  list     Assessment & Plan:  Worsening Abdominal distention, ischemic colitis, SBO:  Clinically worsening colonic ischemia Lactate increase to 2.6 , Temp 101.4, WBC 29.3 High Surgical Risk Plan LR bolus 2 L ( 30 cc per KG)  Exploratory Lap if husband agrees after operative risk is explained  Trend WBC and fever Curve OG tube to suction  Continue Zosyn for now  Admit to OR post op if husband agrees to surgery   Hypotension   Plan Peripheral Levo to maintain MAP of > 65  LR Fluid bolus as above Trend Lactate and Pro calcitonin  Cortisol level   Acute hypoxic respiratory failure due to right sided CAP with parapneumonic effusion Currently on 2 L [place] with adequate sats ? COPD exacerbation  Plan If patient goes to OR, will return post op intubated.  Continue ventilator support with lung protective strategies  Wean PEEP and FiO2 for sats greater than 90%. Head of bed elevated 30 degrees. Plateau pressures less than 30 cm H20.  Follow intermittent chest x-ray and ABG.   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 Cards following  Plan IV heparin Metoprolol No ARB or Entresto Consider digoxin and amiodarone if no improvement  Will need follow up Echo  Hyperkalemia on 8/7 Plan Trend BMET  Nutrition  Plan Per surgery  If patient is not taken to OR due to high risk, will need ongoing goals of care discussions with family   Best Practice (right click and "Reselect all SmartList Selections" daily)   Diet/type: NPO DVT prophylaxis: SCD GI prophylaxis: H2B Lines: N/A Foley:  Not yet, but may be needed  Code Status:  full code Last date of multidisciplinary goals of care discussion [Prnding  Labs   CBC: Recent Labs  Lab 01/05/22 0105 01/06/22 0432 01/06/22 0447 01/07/22 0408 01/08/22 0430 01/10/22 1006 01/23/2022 0041  WBC 15.7*  --  17.6* 12.3* 16.8* 33.2* 29.3*  NEUTROABS 13.5*  --   --   --   --  30.9* 25.8*  HGB 11.1*   < > 11.1* 11.1* 11.1* 13.3 12.1  HCT 31.1*   < > 32.4* 31.2* 32.1*  39.2 37.0  MCV 101.3*  --  106.6* 103.7* 105.2* 106.8* 110.1*  PLT 140*  --  156 132* 171 246 232   < > = values in this interval not displayed.    Basic Metabolic Panel: Recent Labs  Lab 01/04/22 1808 01/05/22 0105 01/05/22 1632 01/06/22 6270 01/06/22 0447 01/06/22 2205 01/07/22 0408 01/08/22 0430 01/09/22 0425 01/10/22 0046 01/21/2022 0041 02/01/2022 0608  NA 129* 130*  --    < > 132*  --    < > 136 138 137 139 139  K 3.4* 3.3*  --    < > 3.5  --    < > 3.7 3.4* 4.6 4.6 5.2*  CL 93* 96*  --   --  100  --    < > 101 100 105 100 103  CO2 25 24  --   --  25  --    < > '27 27 26 28 27  '$ GLUCOSE 143* 192*  --   --  113*  --    < > 137* 172* 134* 87 106*  BUN 15 15  --   --  22  --    < > 26* 30* '22 16 16  '$ CREATININE 0.67 0.66  --   --  0.71  --    < > 0.65 0.60 0.63 0.61 0.66  CALCIUM 9.1 9.0  --   --  8.9  --    < > 9.2 9.5 9.7 9.9 10.1  MG 1.6* 1.6* 2.5*  --  2.4  --   --  2.2 2.0 2.0  --   --   PHOS 2.0* 1.9* 3.4  --  4.3 2.0*  --   --   --   --   --   --    < > = values in this interval not displayed.   GFR: Estimated Creatinine Clearance: 50.4 mL/min (by C-G formula based on SCr of 0.66 mg/dL). Recent Labs  Lab 01/07/22 0408 01/08/22 0430 01/10/22 1006 01/10/22 1459 01/10/22 1800 02/02/2022 0041  WBC 12.3* 16.8* 33.2*  --   --  29.3*  LATICACIDVEN  --   --   --  2.8* 1.6  --     Liver Function Tests: Recent Labs  Lab 01/04/22 1808 01/05/22 0105 01/06/22 0447 01/30/2022 0041  AST  --  25 31 55*  ALT  --  21 22 88*  ALKPHOS  --  52 77 130*  BILITOT  --  1.3* 1.1 2.4*  PROT 5.6* 5.3* 5.8* 5.7*  ALBUMIN  --  2.1* 2.3* 1.9*   No results for input(s): "LIPASE", "AMYLASE" in the last 168 hours. No results for input(s): "AMMONIA" in the last 168 hours.  ABG    Component Value Date/Time   PHART 7.309 (L) 01/06/2022 0432   PCO2ART 53.7 (H) 01/06/2022 0432   PO2ART 141 (H) 01/06/2022 0432   HCO3 26.9 01/06/2022 0432   TCO2 29 01/06/2022 0432   O2SAT 99 01/06/2022  0432     Coagulation Profile: No results for input(s): "INR", "PROTIME" in the last 168 hours.  Cardiac Enzymes: No results for input(s): "CKTOTAL", "CKMB", "CKMBINDEX", "TROPONINI" in the last 168 hours.  HbA1C: Hgb A1c MFr Bld  Date/Time Value Ref Range Status  01/03/2022 09:18 PM 4.9 4.8 - 5.6 % Final    Comment:    (NOTE) Pre diabetes:          5.7%-6.4%  Diabetes:              >6.4%  Glycemic control for   <7.0% adults with diabetes     CBG: Recent Labs  Lab 01/10/22 2020 01/10/2022 0117 01/31/2022 0215 01/05/2022 0415 01/28/2022 0443  GLUCAP 89 69* 73 60* 88    Review of Systems:   Unable as confused and not answering questions  but patient grimaces and guards to abdominal palpation .    Past Medical History:  She,  has a past medical history of Anemia, Anxiety, Arthritis, Asthma, Breast cancer ([place]), Cancer ([place]), Complication of anesthesia, Concussion, Dislocated elbow (10/12/2016), Family history of breast cancer, GERD (gastroesophageal reflux disease), History of kidney stones, History of radiation therapy (03/23/17-05/04/17), Hypertension, Hypertensive heart disease without CHF, Keratosis, actinic, Melanoma ([place]), Persistent atrial fibrillation ([place]) (12/16/2016), and Pneumonia.   Surgical History:   Past Surgical History:  Procedure Laterality Date   brachial skin removal due to deforimity Bilateral    BREAST ENHANCEMENT SURGERY Left 11/14/2018   Procedure: LEFT BREAST AUGMENTATION;  Surgeon: [name], MD;  Location: [facility];  Service: Plastics;  Laterality: Left;   BREAST RECONSTRUCTION Right 06/26/2018   BREAST RECONSTRUCTION WITH PLACEMENT OF TISSUE EXPANDER AND ALLODERM Right 06/26/2018   Procedure: RIGHT BREAST RECONSTRUCTION WITH PLACEMENT OF TISSUE EXPANDER;  Surgeon: [name], MD;  Location: [facility];  Service: Plastics;  Laterality: Right;   BREAST RECONSTRUCTION WITH PLACEMENT OF TISSUE EXPANDER AND FLEX HD (ACELLULAR  HYDRATED DERMIS) Right 01/28/2017    RIGHT BREAST RECONSTRUCTION WITH PLACEMENT OF TISSUE EXPANDER AND ALLODERM;  Surgeon: [name], MD;  Location: [facility];  Service: Plastics;  Laterality: Right;   BUNIONECTOMY     left    CATARACT EXTRACTION     bilaterla cataract surgery    COLONOSCOPY W/ POLYPECTOMY     JOINT REPLACEMENT     KNEE ARTHROSCOPY     left    LATISSIMUS FLAP TO BREAST Right 06/26/2018   Procedure: LATISSIMUS FLAP TO RIGHT CHEST;  Surgeon: [name], MD;  Location: [facility];  Service: Plastics;  Laterality: Right;   MASTECTOMY Right    MASTECTOMY W/ SENTINEL NODE BIOPSY Right 01/28/2017   Procedure: RIGHT TOTAL MASTECTOMY WITH SENTINEL LYMPH NODE BIOPSY;  Surgeon: Excell Seltzer, MD;  Location: [facility];  Service: General;  Laterality: Right;   MASTOPEXY Left 11/14/2018   Procedure: LEFT BREAST MASTOPEXY;  Surgeon: [name], MD;  Location: [facility];  Service: Plastics;  Laterality: Left;   MELANOMA EXCISION Left    polyp removed from uterus      REMOVAL OF TISSUE EXPANDER AND PLACEMENT OF IMPLANT Right 11/14/2018   Procedure: REMOVAL OF RIGHT TISSUE EXPANDER AND PLACEMENT OF SALINE IMPLANT;  Surgeon: [name], MD;  Location: [facility];  Service: Plastics;  Laterality: Right;   right wrist pinning      skin cancers removed     TISSUE EXPANDER PLACEMENT Right 02/27/2018   Procedure: REMOVAL OF TISSUE EXPANDER;  Surgeon: [name], MD;  Location: [facility];  Service: Plastics;  Laterality: Right;   TOTAL KNEE ARTHROPLASTY Right 01/29/2013   Procedure: RIGHT TOTAL KNEE ARTHROPLASTY;  Surgeon: Gearlean Alf, MD;  Location[facility];  Service: Orthopedics;  Laterality: Right;   TOTAL KNEE ARTHROPLASTY Left 08/29/2017   Procedure: LEFT TOTAL KNEE ARTHROPLASTY;  Surgeon: Gaynelle Arabian, MD;  Location: [facility];  Service: Orthopedics;  Laterality: Left;     Social History:   reports that she has never smoked. She has never  used smokeless tobacco. She reports current alcohol use of about 1.0 standard drink of alcohol per week. She reports that she does not use drugs.   Family History:  Her family history includes Breast cancer in an other family member; COPD in her father; Hypertension in an other family member.   Allergies Allergies  Allergen Reactions   Chlorhexidine Gluconate Hives, Swelling and Rash       "ChloraPrep "   Doxycycline Nausea And Vomiting   Codeine Nausea Only and Other (See Comments)    Pt can tolerate when pre-medicated   Doxycycline Nausea And Vomiting   Nsaids     avoid due to gerd   Nsaids Other (See Comments)    Gi bleed   Tylenol [Acetaminophen]     Avoid due to GERD   Adhesive [Tape] Rash    Dermabond   Chlorhexidine Swelling and Rash   Codeine Nausea Only   Tylenol [Acetaminophen] Other (See Comments)    GI bleed     Home Medications  Prior  to Admission medications   Medication Sig Start Date End Date Taking? Authorizing Provider  albuterol (VENTOLIN HFA) 108 (90 Base) MCG/ACT inhaler Inhale 1 puff into the lungs every 6 (six) hours as needed for wheezing or shortness of breath.   Yes [provider]  ALPRAZolam Duanne Moron) 0.5 MG tablet Take 0.5 mg by mouth daily as needed for anxiety.    Yes [provider]  B Complex Vitamins (VITAMIN B COMPLEX PO) Take 1 capsule by mouth daily.   Yes [provider]  BREO ELLIPTA 100-25 MCG/INH AEPB Inhale 1 puff into the lungs daily. 12/22/16  Yes [provider]  COLLAGEN PO Take 1 capsule by mouth daily.   Yes [provider]  desonide (DESOWEN) 0.05 % cream Apply 1 application topically 2 (two) times daily as needed (for skin irritation.).   Yes [provider]  diclofenac sodium (VOLTAREN) 1 % GEL Apply 2 g topically daily as needed (pain).    Yes [provider]  EPINEPHrine 0.3 mg/0.3 mL IJ SOAJ injection Inject 0.3 mg into the muscle as needed for anaphylaxis.   Yes  [provider]  famotidine (PEPCID) 20 MG tablet Take 1 tablet by mouth as directed. 06/29/19  Yes [provider]  fluorouracil (EFUDEX) 5 % cream Apply 1 application  topically daily. Per MD instructions, for skin cancer 12/03/20  Yes [provider]  fluticasone (FLONASE) 50 MCG/ACT nasal spray Place 2 sprays into both nostrils daily.   Yes [provider]  irbesartan (AVAPRO) 150 MG tablet Take 1 tablet (150 mg total) by mouth daily. 04/10/21  Yes Lelon Perla, MD  letrozole [facility]) 2.5 MG tablet TAKE 1 TABLET BY MOUTH EVERY DAY 05/29/21  Yes Magrinat, Virgie Dad, MD  levocetirizine (XYZAL) 5 MG tablet Take 5 mg by mouth at bedtime. 09/04/20  Yes [provider]  Methylsulfonylmethane (MSM PO) Take 1 capsule by mouth daily.   Yes [provider]  MILK THISTLE PO Take 1 capsule by mouth daily.   Yes [provider]  Misc Natural Products (PYCNOGENOL COMPLEX PO) Take 1 capsule by mouth daily.   Yes [provider]  Omega-3 Fatty Acids (OMEGA 3 PO) Take 1 capsule by mouth daily.   Yes [provider]  OVER THE COUNTER MEDICATION Take 1 capsule by mouth daily. Immunity supplement   Yes [provider]  oxyCODONE (ROXICODONE) 5 MG immediate release tablet Take 1 tablet (5 mg total) by mouth every 4 (four) hours as needed for severe pain. 09/02/21  Yes Gareth Morgan, MD  Polyethyl Glycol-Propyl Glycol (SYSTANE OP) Place 1 drop into both eyes daily.   Yes [provider]  potassium chloride (KLOR-CON) 20 MEQ packet Take 20 mEq by mouth daily.   Yes [provider]  propranolol ER (INDERAL LA) 80 MG 24 hr capsule Take 80 mg by mouth daily.   Yes [provider]  TURMERIC PO Take 1 capsule by mouth daily.   Yes [provider]  venlafaxine XR (EFFEXOR-XR) 75 MG 24 hr capsule TAKE 1 CAPSULE BY MOUTH DAILY WITH BREAKFAST. 12/30/21  Yes Iruku, Arletha Pili, MD  VITAMIN D PO Take 1  capsule by mouth daily.   Yes [provider]  XARELTO 20 MG TABS tablet TAKE 1 TABLET BY MOUTH DAILY WITH SUPPER Patient taking differently: Take 20 mg by mouth daily. 09/29/21  Yes Vickie Epley, MD  furosemide (LASIX) 20 MG tablet Take 1 tablet (20 mg total) by mouth daily  as needed. Patient not taking: Reported on 01/02/2022 11/18/21   Deberah Pelton, NP     Critical care time: 34 minutes   Magdalen Spatz, MSN, AGACNP-BC [place] for personal pager PCCM on call pager [phone]  02/02/2022 9:31 AM

## 2022-01-12 ENCOUNTER — Inpatient Hospital Stay (HOSPITAL_COMMUNITY): Payer: Medicare Other

## 2022-01-12 ENCOUNTER — Inpatient Hospital Stay: Payer: Self-pay

## 2022-01-12 ENCOUNTER — Encounter (HOSPITAL_COMMUNITY): Payer: Self-pay | Admitting: General Surgery

## 2022-01-12 DIAGNOSIS — J189 Pneumonia, unspecified organism: Secondary | ICD-10-CM | POA: Diagnosis not present

## 2022-01-12 DIAGNOSIS — I4819 Other persistent atrial fibrillation: Secondary | ICD-10-CM | POA: Diagnosis not present

## 2022-01-12 LAB — POCT I-STAT 7, (LYTES, BLD GAS, ICA,H+H)
Acid-Base Excess: 1 mmol/L (ref 0.0–2.0)
Bicarbonate: 26.4 mmol/L (ref 20.0–28.0)
Calcium, Ion: 1.29 mmol/L (ref 1.15–1.40)
HCT: 31 % — ABNORMAL LOW (ref 36.0–46.0)
Hemoglobin: 10.5 g/dL — ABNORMAL LOW (ref 12.0–15.0)
O2 Saturation: 100 %
Patient temperature: 36.1
Potassium: 4 mmol/L (ref 3.5–5.1)
Sodium: 133 mmol/L — ABNORMAL LOW (ref 135–145)
TCO2: 28 mmol/L (ref 22–32)
pCO2 arterial: 41.3 mmHg (ref 32–48)
pH, Arterial: 7.41 (ref 7.35–7.45)
pO2, Arterial: 173 mmHg — ABNORMAL HIGH (ref 83–108)

## 2022-01-12 LAB — COMPREHENSIVE METABOLIC PANEL
ALT: 47 U/L — ABNORMAL HIGH (ref 0–44)
AST: 32 U/L (ref 15–41)
Albumin: <1.5 g/dL — ABNORMAL LOW (ref 3.5–5.0)
Alkaline Phosphatase: 86 U/L (ref 38–126)
Anion gap: 8 (ref 5–15)
BUN: 20 mg/dL (ref 8–23)
CO2: 23 mmol/L (ref 22–32)
Calcium: 8.4 mg/dL — ABNORMAL LOW (ref 8.9–10.3)
Chloride: 101 mmol/L (ref 98–111)
Creatinine, Ser: 0.61 mg/dL (ref 0.44–1.00)
GFR, Estimated: >60 mL/min (ref >60)
Glucose, Bld: 255 mg/dL — ABNORMAL HIGH (ref 70–99)
Potassium: 3.9 mmol/L (ref 3.5–5.1)
Sodium: 132 mmol/L — ABNORMAL LOW (ref 135–145)
Total Bilirubin: 1.3 mg/dL — ABNORMAL HIGH (ref 0.3–1.2)
Total Protein: 4.7 g/dL — ABNORMAL LOW (ref 6.5–8.1)

## 2022-01-12 LAB — CBC WITH DIFFERENTIAL/PLATELET
Abs Immature Granulocytes: 0.52 10*3/uL — ABNORMAL HIGH (ref 0.00–0.07)
Basophils Absolute: 0.1 10*3/uL (ref 0.0–0.1)
Basophils Relative: 0 %
Eosinophils Absolute: 0 10*3/uL (ref 0.0–0.5)
Eosinophils Relative: 0 %
HCT: 30.5 % — ABNORMAL LOW (ref 36.0–46.0)
Hemoglobin: 10.2 g/dL — ABNORMAL LOW (ref 12.0–15.0)
Immature Granulocytes: 2 %
Lymphocytes Relative: 3 %
Lymphs Abs: 0.7 10*3/uL (ref 0.7–4.0)
MCH: 36.2 pg — ABNORMAL HIGH (ref 26.0–34.0)
MCHC: 33.4 g/dL (ref 30.0–36.0)
MCV: 108.2 fL — ABNORMAL HIGH (ref 80.0–100.0)
Monocytes Absolute: 1.3 10*3/uL — ABNORMAL HIGH (ref 0.1–1.0)
Monocytes Relative: 5 %
Neutro Abs: 21 10*3/uL — ABNORMAL HIGH (ref 1.7–7.7)
Neutrophils Relative %: 90 %
Platelets: 268 10*3/uL (ref 150–400)
RBC: 2.82 MIL/uL — ABNORMAL LOW (ref 3.87–5.11)
RDW: 13.2 % (ref 11.5–15.5)
WBC: 23.6 10*3/uL — ABNORMAL HIGH (ref 4.0–10.5)
nRBC: 0 % (ref 0.0–0.2)

## 2022-01-12 LAB — POCT I-STAT EG7
Acid-base deficit: 2 mmol/L (ref 0.0–2.0)
Bicarbonate: 23.6 mmol/L (ref 20.0–28.0)
Calcium, Ion: 1.24 mmol/L (ref 1.15–1.40)
HCT: 28 % — ABNORMAL LOW (ref 36.0–46.0)
Hemoglobin: 9.5 g/dL — ABNORMAL LOW (ref 12.0–15.0)
O2 Saturation: 58 %
Patient temperature: 36.3
Potassium: 3.6 mmol/L (ref 3.5–5.1)
Sodium: 132 mmol/L — ABNORMAL LOW (ref 135–145)
TCO2: 25 mmol/L (ref 22–32)
pCO2, Ven: 43.3 mmHg — ABNORMAL LOW (ref 44–60)
pH, Ven: 7.342 (ref 7.25–7.43)
pO2, Ven: 31 mmHg — CL (ref 32–45)

## 2022-01-12 LAB — GLUCOSE, CAPILLARY
Glucose-Capillary: 148 mg/dL — ABNORMAL HIGH (ref 70–99)
Glucose-Capillary: 156 mg/dL — ABNORMAL HIGH (ref 70–99)
Glucose-Capillary: 197 mg/dL — ABNORMAL HIGH (ref 70–99)
Glucose-Capillary: 248 mg/dL — ABNORMAL HIGH (ref 70–99)
Glucose-Capillary: 258 mg/dL — ABNORMAL HIGH (ref 70–99)
Glucose-Capillary: 59 mg/dL — ABNORMAL LOW (ref 70–99)
Glucose-Capillary: 94 mg/dL (ref 70–99)

## 2022-01-12 LAB — PHOSPHORUS: Phosphorus: 3.6 mg/dL (ref 2.5–4.6)

## 2022-01-12 LAB — APTT: aPTT: 31 seconds (ref 24–36)

## 2022-01-12 LAB — COOXEMETRY PANEL
Carboxyhemoglobin: 1.4 % (ref 0.5–1.5)
Methemoglobin: <0.7 % (ref 0.0–1.5)
O2 Saturation: 70.7 %
Total hemoglobin: 9.3 g/dL — ABNORMAL LOW (ref 12.0–16.0)

## 2022-01-12 LAB — MAGNESIUM: Magnesium: 1.8 mg/dL (ref 1.7–2.4)

## 2022-01-12 LAB — TRIGLYCERIDES: Triglycerides: 176 mg/dL — ABNORMAL HIGH (ref <150)

## 2022-01-12 LAB — LACTIC ACID, PLASMA: Lactic Acid, Venous: 2.2 mmol/L (ref 0.5–1.9)

## 2022-01-12 MED ORDER — MAGNESIUM SULFATE 2 GM/50ML IV SOLN: 2.0000 g | Freq: Once | INTRAVENOUS | Status: DC

## 2022-01-12 MED ORDER — LACTATED RINGERS IV BOLUS
1000.0000 mL | Freq: Once | INTRAVENOUS | Status: AC
Start: 2022-01-12 — End: 2022-01-13
  Administered 2022-01-12: 1000 mL via INTRAVENOUS

## 2022-01-12 MED ORDER — FENTANYL CITRATE PF 50 MCG/ML IJ SOSY: 25.0000 ug | PREFILLED_SYRINGE | Freq: Once | INTRAMUSCULAR | Status: DC

## 2022-01-12 MED ORDER — SODIUM CHLORIDE 0.9% FLUSH
10.0000 mL | INTRAVENOUS | Status: DC | PRN
  Administered 2022-02-04 – 2022-02-05 (×3): 10 mL

## 2022-01-12 MED ORDER — FENTANYL 2500MCG IN NS 250ML (10MCG/ML) PREMIX INFUSION
25.0000 ug/h | INTRAVENOUS | Status: DC
  Administered 2022-01-12: 25 ug/h via INTRAVENOUS
  Administered 2022-01-13 (×2): 200 ug/h via INTRAVENOUS
  Filled 2022-01-12 (×4): qty 250

## 2022-01-12 MED ORDER — ALBUMIN HUMAN 25 % IV SOLN
25.0000 g | Freq: Four times a day (QID) | INTRAVENOUS | Status: AC
  Administered 2022-01-12 – 2022-01-13 (×4): 25 g via INTRAVENOUS
  Filled 2022-01-12 (×3): qty 100

## 2022-01-12 MED ORDER — SODIUM CHLORIDE 0.9% FLUSH
10.0000 mL | Freq: Two times a day (BID) | INTRAVENOUS | Status: DC
  Administered 2022-01-12 – 2022-01-14 (×5): 10 mL

## 2022-01-12 MED ORDER — MAGNESIUM SULFATE IN D5W 1-5 GM/100ML-% IV SOLN
1.0000 g | Freq: Once | INTRAVENOUS | Status: AC
  Administered 2022-01-12: 1 g via INTRAVENOUS
  Filled 2022-01-12: qty 100

## 2022-01-12 MED ORDER — PANTOPRAZOLE SODIUM 40 MG IV SOLR
40.0000 mg | INTRAVENOUS | Status: DC
  Administered 2022-01-12 – 2022-01-25 (×14): 40 mg via INTRAVENOUS
  Filled 2022-01-12 (×14): qty 10

## 2022-01-12 MED ORDER — FENTANYL BOLUS VIA INFUSION: 25.0000 ug | INTRAVENOUS | Status: DC | PRN

## 2022-01-12 MED ORDER — LACTATED RINGERS IV BOLUS
500.0000 mL | Freq: Once | INTRAVENOUS | Status: AC
  Administered 2022-01-12: 500 mL via INTRAVENOUS

## 2022-01-12 NOTE — Progress Notes (Signed)
NAME:  [name], MRN:  [id number], DOB:  03-Dec-1940, LOS: 54 ADMISSION DATE:  01/02/2022, CONSULTATION DATE:  01/26/2022 REFERRING MD:  Bonner Puna, CHIEF COMPLAINT:  Hypotension, abdominal distention    History of Present Illness:  81 yo female admited for CAP causing respiratory failure, had a cardiac arrest after admission and was moved to the ICU.  Chest tube placed for parapneumonic effusion, extubated on 8/2. Sent out of ICU on 8/5. 8/6 developed ischemic colitis initially treated conservatively but this morning we were called back 8/7 for worsening shock, confusion.  She is going to the OR today.   Pertinent  Medical History  Breast ca on letrozole Afib on [place] Hospital Events: Including procedures, antibiotic start and stop dates in addition to other pertinent events   7/28 admit, Ceftriaxone/Azithro 7/29 last dose xeralto  7/30 IHCA x 7 min, Ceftriaxone>Cefepime 7/31 Chest tube placed, heparin gtt started 8/1 patient remove chest tube.  Head CT obtained >negative.  Severely agitated overnight.  Vancomycin stopped.  Azithromycin stopped. 8/6 [facility]red [facility] 8/7 Called CCM back for abd, Distention , hypotension and a fib with RVR, plan is for  urgent OR pending goals of care discussion then ICU post op if husband is in agreement.  Interim History / Subjective:  Ischemic bowel needed to be taken to OR 8/7. Out to ICU post op on vent and pressors. Open abdomen.   Objective   Blood pressure (!) 131/57, pulse 78, temperature 98.1 F (36.7 C), resp. rate 19, height 5' (1.524 m), weight 73.3 kg, SpO2 100 %. PAP: (32-57)/(10-29) 47/19 CVP:  [3 mmHg-18 mmHg] 6 mmHg CO:  [2.6 L/min-2.7 L/min] 2.7 L/min CI:  [1.6 L/min/m2] 1.6 L/min/m2  Vent Mode: PSV;CPAP FiO2 (%):  [40 %] 40 % Set Rate:  [16 bmp] 16 bmp Vt Set:  [360 mL] 360 mL PEEP:  [5 cmH20] 5 cmH20 Pressure Support:  [8 cmH20] 8 cmH20 Plateau Pressure:  [16 cmH20-20 cmH20] 17 cmH20   Intake/Output  Summary (Last 24 hours) at 01/12/2022 0824 Last data filed at 01/12/2022 0700 Gross per 24 hour  Intake 5777.82 ml  Output 1200 ml  Net 4577.82 ml    Filed Weights   01/08/22 0500 01/07/2022 0400 01/12/22 0500  Weight: 67 kg 69.5 kg 73.3 kg    Examination:  General:  elderly female on vent. Deep sedation HENT: [place]/AT, PERRL, ETT Lungs: Diminished bases.  Cardiovascular: RRR, no MRG Abdomen:  Longitudinal surgical incision. Wound vac in place.  Extremities:  Cool. Mottled toes. No edema.  Neuro:  RASS -4 GU:  decreased UOP. Foley  Significant labs Na 132, K 3.9, Glucose 255, mag 1.8, albumin < 1.5 Triglyceride 176 Lactic 2.2 WBC 23.6, hgb 10.2 Co-ox 58  Resolved Hospital Problem list     Assessment & Plan:   Septic shock Ischemic colitis with perforation. Possible PNA ? Component of cardiogenic shock: co-ox on the low side. 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 Open abdomen, wound vac. Back to OR 8/9 if off pressors.   Acute hypoxic respiratory failure due to right sided CAP with parapneumonic effusion Currently on 2 L [place] with adequate sats COPD +/- acute exacerbation  - Full vent support - Pulmonary hygiene  - Neb triple therapy - Avoiding steroids  - Propofol infusion for RASS -4 with open abdomen -  Add fentanyl infusion - Trend triglycerides, borderline high. Adding fent to wean prop.   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 - IV heparin on hold perioperatively - Amiodarone drip - Appreciate cardiology assistance  - Will eventually need follow up Echo  Hypokalemia Hypomag - Trend BMET - Supp mag, K as indicated  Nutrition  - Will request PICC for TPN - NPO   Best Practice (right click and "Reselect all SmartList  Selections" daily)   Diet/type: NPO DVT prophylaxis: SCD GI prophylaxis: H2B Lines: Central line [place] Foley:  Foley Code Status:  full code Last date of multidisciplinary goals of care discussion [Prnding  Labs   CBC: Recent Labs  Lab 01/07/22 0408 01/08/22 0430 01/10/22 1006 01/09/2022 0041 01/30/2022 1102 01/28/2022 1305 01/12/22 0341 01/12/22 0454 01/12/22 0650  WBC 12.3* 16.8* 33.2* 29.3*  --   --  23.6*  --   --   NEUTROABS  --   --  30.9* 25.8*  --   --  21.0*  --   --   HGB 11.1* 11.1* 13.3 12.1 10.2* 11.6* 10.2* 10.5* 9.5*  HCT 31.2* 32.1* 39.2 37.0 30.0* 34.0* 30.5* 31.0* 28.0*  MCV 103.7* 105.2* 106.8* 110.1*  --   --  108.2*  --   --   PLT 132* 171 246 232  --   --  268  --   --      Basic Metabolic Panel: Recent Labs  Lab 01/05/22 1632 01/06/22 0432 01/06/22 0447 01/06/22 2205 01/07/22 0408 01/09/22 0425 01/10/22 0046 01/12/2022 0041 01/07/2022 8119 01/15/2022 1102 01/26/2022 1252 01/17/2022 1305 01/26/2022 1409 01/12/22 0341 01/12/22 0454 01/12/22 0650  NA  --    < > 132*  --    < > 138 137 139 139   < >  --  136 136 132* 133* 132*  K  --    < > 3.5  --    < > 3.4* 4.6 4.6 5.2*   < >  --  4.3 4.4 3.9 4.0 3.6  CL  --   --  100  --    < > 100 105 100 103  --   --   --  102 101  --   --   CO2  --   --  25  --    < > '27 26 28 27  '$ --   --   --  27 23  --   --   GLUCOSE  --   --  113*  --    < > 172* 134* 87 106*  --   --   --  208* 255*  --   --   BUN  --   --  22  --    < > 30* '22 16 16  '$ --   --   --  17 20  --   --   CREATININE  --   --  0.71  --    < > 0.60 0.63 0.61 0.66  --   --   --  0.59 0.61  --   --   CALCIUM  --   --  8.9  --    < > 9.5 9.7 9.9 10.1  --   --   --  8.8* 8.4*  --   --   MG 2.5*  --  2.4  --    < > 2.0 2.0  --   --   --  1.7  --  1.7 1.8  --   --   PHOS 3.4  --  4.3 2.0*  --   --   --   --   --   --   --   --   --   --   --   --    < > = values in this interval not displayed.    GFR: Estimated Creatinine Clearance: 51.8 mL/min (by C-G  formula based on SCr of 0.61 mg/dL). Recent Labs  Lab 01/08/22 0430 01/10/22 1006 01/10/22 1459 01/10/22 1800 01/10/2022 0041 01/21/2022 0608 01/22/2022 1252 01/12/22 0341  PROCALCITON  --   --   --   --   --   --  0.38  --   WBC 16.8* 33.2*  --   --  29.3*  --   --  23.6*  LATICACIDVEN  --   --    < > 1.6  --  2.6* 1.2 2.2*   < > = values in this interval not displayed.     Liver Function Tests: Recent Labs  Lab 01/06/22 0447 01/19/2022 0041 01/20/2022 1409 01/12/22 0341  AST 31 55* 38 32  ALT 22 88* 59* 47*  ALKPHOS 77 130* 101 86  BILITOT 1.1 2.4* 2.4* 1.3*  PROT 5.8* 5.7* 4.5* 4.7*  ALBUMIN 2.3* 1.9* <1.5* <1.5*    No results for input(s): "LIPASE", "AMYLASE" in the last 168 hours. No results for input(s): "AMMONIA" in the last 168 hours.  ABG    Component Value Date/Time   PHART 7.410 01/12/2022 0454   PCO2ART 41.3 01/12/2022 0454   PO2ART 173 (H) 01/12/2022 0454   HCO3 23.6 01/12/2022 0650   TCO2 25 01/12/2022 0650   ACIDBASEDEF 2.0 01/12/2022 0650   O2SAT 58 01/12/2022 0650     Coagulation Profile: No results for input(s): "INR", "PROTIME" in the last 168 hours.  Cardiac Enzymes: No results for input(s): "CKTOTAL", "CKMB", "CKMBINDEX", "TROPONINI" in the last 168 hours.  HbA1C: Hgb A1c MFr Bld  Date/Time Value Ref Range Status  01/03/2022 09:18 PM 4.9 4.8 - 5.6 % Final    Comment:    (NOTE) Pre diabetes:          5.7%-6.4%  Diabetes:              >6.4%  Glycemic control for   <7.0% adults with diabetes     CBG: Recent Labs  Lab 01/15/2022 1528 01/19/2022 2001 01/12/22 0036 01/12/22 0349 01/12/22 0740  GLUCAP 237* 282* 258* 248* 197*     Critical care time: 50 mins     Georgann Housekeeper, AGACNP-BC [facility]  See [place] for personal pager PCCM on call pager [phone] until 7pm. Please call Elink 7p-7a. [phone]  01/12/2022 9:50 AM

## 2022-01-12 NOTE — Progress Notes (Signed)
Progress Note  Patient Name: [name] Date of Encounter: 01/12/2022  [facility] Cardiologist: Kirk Ruths, MD   Subjective   Intubated and sedated  Inpatient Medications    Scheduled Meds:  arformoterol  15 mcg Nebulization BID   budesonide (PULMICORT) nebulizer solution  0.25 mg Nebulization BID   dextrose  50 mL Intravenous Once   docusate  100 mg Per Tube BID   insulin aspart  0-9 Units Subcutaneous Q4H   mouth rinse  15 mL Mouth Rinse Q2H   pantoprazole sodium  40 mg Per Tube Daily   polyethylene glycol  17 g Per Tube Daily   revefenacin  175 mcg Nebulization Daily   Continuous Infusions:  sodium chloride 10 mL/hr (01/12/22 0400)   sodium chloride Stopped (01/19/2022 1404)   amiodarone 30 mg/hr (01/12/22 0400)   dextrose 5 % and 0.45% NaCl Stopped (01/28/2022 0650)   famotidine (PEPCID) IV Stopped (01/12/2022 2225)   lactated ringers     micafungin (MYCAMINE) 100 mg in sodium chloride 0.9 % 100 mL IVPB Stopped (01/10/2022 1646)   norepinephrine (LEVOPHED) Adult infusion 10 mcg/min (01/12/22 0400)   piperacillin-tazobactam (ZOSYN)  IV Stopped (01/12/22 0154)   propofol (DIPRIVAN) infusion 40 mcg/kg/min (01/12/22 0400)   vasopressin 0.03 Units/min (01/12/22 0400)   PRN Meds: sodium chloride, acetaminophen, docusate, fentaNYL (SUBLIMAZE) injection, fentaNYL (SUBLIMAZE) injection, HYDROmorphone (DILAUDID) injection, levalbuterol, ondansetron (ZOFRAN) IV, mouth rinse, polyethylene glycol   Vital Signs    Vitals:   01/12/22 0500 01/12/22 0515 01/12/22 0530 01/12/22 0545  BP: 136/67     Pulse: (!) 58 66 61 62  Resp: '16 16 16 16  '$ Temp: (!) 97 F (36.1 C) (!) 97.2 F (36.2 C) (!) 97.3 F (36.3 C) (!) 97.3 F (36.3 C)  TempSrc:      SpO2: 100% 100% 100% 100%  Weight: 73.3 kg     Height:        Intake/Output Summary (Last 24 hours) at 01/12/2022 6301 Last data filed at 01/12/2022 0400 Gross per 24 hour  Intake 5224.66 ml  Output 1150 ml  Net 4074.66 ml        01/12/2022    5:00 AM 01/05/2022    4:00 AM 01/08/2022    5:00 AM  Last 3 Weights  Weight (lbs) 161 lb 9.6 oz 153 lb 3.5 oz 147 lb 11.3 oz  Weight (kg) 73.3 kg 69.5 kg 67 kg      Telemetry    Atrial fibrillation rate controlled- Personally Reviewed   Physical Exam   GEN: Intubated and sedated Neck: JVP difficult to assess Cardiac: irregular Respiratory: CTA anteriorly; no wheeze GI: s/p abd surgery MS: trace edema Neuro:  Intubated and sedated Psych: Intubated and sedated  Labs    High Sensitivity Troponin:   Recent Labs  Lab 01/02/22 1023 01/02/22 1308 01/03/22 2119 01/04/22 0513 01/05/22 0502  TROPONINIHS 8 11 123* 131* 55*      Chemistry Recent Labs  Lab 01/23/2022 0041 01/09/2022 0608 01/28/2022 1102 01/17/2022 1252 01/14/2022 1305 01/12/2022 1409 01/12/22 0341 01/12/22 0454  NA 139 139   < >  --    < > 136 132* 133*  K 4.6 5.2*   < >  --    < > 4.4 3.9 4.0  CL 100 103  --   --   --  102 101  --   CO2 28 27  --   --   --  27 23  --   GLUCOSE 87  106*  --   --   --  208* 255*  --   BUN 16 16  --   --   --  17 20  --   CREATININE 0.61 0.66  --   --   --  0.59 0.61  --   CALCIUM 9.9 10.1  --   --   --  8.8* 8.4*  --   MG  --   --   --  1.7  --  1.7 1.8  --   PROT 5.7*  --   --   --   --  4.5* 4.7*  --   ALBUMIN 1.9*  --   --   --   --  <1.5* <1.5*  --   AST 55*  --   --   --   --  38 32  --   ALT 88*  --   --   --   --  59* 47*  --   ALKPHOS 130*  --   --   --   --  101 86  --   BILITOT 2.4*  --   --   --   --  2.4* 1.3*  --   GFRNONAA >60 >60  --   --   --  >60 >60  --   ANIONGAP 11 9  --   --   --  7 8  --    < > = values in this interval not displayed.      Hematology Recent Labs  Lab 01/10/22 1006 01/24/2022 0041 01/30/2022 1102 02/04/2022 1305 01/12/22 0341 01/12/22 0454  WBC 33.2* 29.3*  --   --  23.6*  --   RBC 3.67* 3.36*  --   --  2.82*  --   HGB 13.3 12.1   < > 11.6* 10.2* 10.5*  HCT 39.2 37.0   < > 34.0* 30.5* 31.0*  MCV 106.8* 110.1*   --   --  108.2*  --   MCH 36.2* 36.0*  --   --  36.2*  --   MCHC 33.9 32.7  --   --  33.4  --   RDW 13.5 13.5  --   --  13.2  --   PLT 246 232  --   --  268  --    < > = values in this interval not displayed.      Radiology    DG CHEST PORT 1 VIEW  Result Date: 01/16/2022 CLINICAL DATA:  Respiratory failure.  Endotracheal tube placement. EXAM: PORTABLE CHEST 1 VIEW COMPARISON:  January 11, 2022. FINDINGS: Endotracheal tube is noted in grossly good position. Nasogastric tube is seen with tip in proximal stomach. Right internal jugular Swan-Ganz catheter is now noted with tip in expected position of main pulmonary artery. Stable right basilar atelectasis or infiltrate is noted with associated effusion. Small left pleural effusion is noted. Bony thorax is unremarkable. IMPRESSION: Endotracheal and nasogastric tubes are in grossly good position. Right internal jugular Swan-Ganz catheter is noted with tip in expected position of main pulmonary artery. Electronically Signed   By: Marijo Conception M.D.   On: 01/24/2022 13:10   DG Abd Portable 1V  Result Date: 01/17/2022 CLINICAL DATA:  Ileus. EXAM: PORTABLE ABDOMEN - 1 VIEW COMPARISON:  01/10/2022 FINDINGS: NG tube is been removed in the interval. Similar appearance of diffuse gaseous small bowel and colonic distension. Contrast in the bladder is compatible with recent CT scan. IMPRESSION: Similar appearance of  diffuse gaseous small bowel and colonic distension. Interval removal of NG tube. Electronically Signed   By: Misty Stanley M.D.   On: 01/14/2022 07:36   DG CHEST PORT 1 VIEW  Result Date: 01/28/2022 CLINICAL DATA:  NG tube placement. EXAM: PORTABLE CHEST 1 VIEW COMPARISON:  Earlier same day FINDINGS: 0618 hours. Stable asymmetric elevation right hemidiaphragm with right base collapse/consolidation and small right effusion. Minimal atelectasis noted left base with possible tiny left pleural effusion. The cardio pericardial silhouette is enlarged. NG  tube tip is in the stomach with proximal side port below the GE junction. Telemetry leads overlie the chest. IMPRESSION: NG tube tip is in the stomach with proximal side port below the GE junction. Electronically Signed   By: Misty Stanley M.D.   On: 01/17/2022 06:33   DG CHEST PORT 1 VIEW  Result Date: 01/21/2022 CLINICAL DATA:  NG tube placement. EXAM: PORTABLE CHEST 1 VIEW COMPARISON:  01/10/2022 FINDINGS: 0610 hours. NG tube tip is positioned at the level of the mid esophagus. Asymmetric elevation right hemidiaphragm noted with right base collapse/consolidation and small right pleural effusion. Minimal atelectasis noted left base with tiny left effusion. The cardio pericardial silhouette is enlarged. Bones are diffusely demineralized. Telemetry leads overlie the chest. IMPRESSION: 1. NG tube tip positioned at the level of the mid esophagus. 2. Asymmetric elevation right hemidiaphragm with right base collapse/consolidation and small right pleural effusion. Electronically Signed   By: Misty Stanley M.D.   On: 01/19/2022 06:33   DG Chest Port 1 View  Result Date: 01/10/2022 CLINICAL DATA:  Small bowel disease. Respiratory distress. EXAM: PORTABLE CHEST 1 VIEW COMPARISON:  January 06, 2022 FINDINGS: Enteric catheter curled on itself with tip collimated off the image at the level of the mid gastric body. Enlarged cardiac silhouette. Right pleural effusion and right lower lung field airspace consolidation. Mild atelectasis of the left lower lung. Osseous structures are without acute abnormality. Soft tissues are grossly normal. IMPRESSION: 1. Enteric catheter folded on itself with tip collimated off the image at the level of the mid gastric body. 2. Right pleural effusion and right lower lung field airspace consolidation. Electronically Signed   By: Fidela Salisbury M.D.   On: 01/10/2022 16:33   DG Abd Portable 1V  Result Date: 01/10/2022 CLINICAL DATA:  NG tube placement. EXAM: PORTABLE ABDOMEN - 1 VIEW  COMPARISON:  01/10/2022 FINDINGS: Enteric feeding tube has been removed. A nasal/orogastric tube passes well below the diaphragm, tip projecting in the distal stomach. Dilated loops of small bowel noted centrally, unchanged from the earlier study. IMPRESSION: Well-positioned nasal/orogastric tube. Electronically Signed   By: Lajean Manes M.D.   On: 01/10/2022 16:32   CT ABDOMEN PELVIS W CONTRAST  Result Date: 01/10/2022 CLINICAL DATA:  Bowel obstruction.  Abdominal pain. EXAM: CT ABDOMEN AND PELVIS WITH CONTRAST TECHNIQUE: Multidetector CT imaging of the abdomen and pelvis was performed using the standard protocol following bolus administration of intravenous contrast. RADIATION DOSE REDUCTION: This exam was performed according to the departmental dose-optimization program which includes automated exposure control, adjustment of the mA and/or kV according to patient size and/or use of iterative reconstruction technique. CONTRAST:  51m OMNIPAQUE IOHEXOL 300 MG/ML  SOLN COMPARISON:  Chest abdomen pelvis CT 01/03/2022 FINDINGS: Lower chest: Collapse/consolidation in the right lower lung persists but is improved since prior study. Small right pleural effusion noted. The cardio pericardial silhouette is enlarged. Hepatobiliary: No suspicious focal abnormality within the liver parenchyma. There is no evidence for gallstones, gallbladder wall  thickening, or pericholecystic fluid. No intrahepatic or extrahepatic biliary dilation. Pancreas: No focal mass lesion. No dilatation of the main duct. No intraparenchymal cyst. No peripancreatic edema. Spleen: No splenomegaly. No focal mass lesion. Adrenals/Urinary Tract: No adrenal nodule or mass. Cortical scarring noted in both kidneys. No hydronephrosis tiny hypodensities in both kidneys are too small to characterize but likely benign. Stomach/Bowel: Stomach is markedly distended with fluid. Feeding tube tip is in the antral region adjacent to the pylorus. Duodenum is  normally positioned as is the ligament of Treitz. Small bowel loops are fluid-filled and distended up to 3.4 cm diameter. Small bowel loops in the right lower quadrant a completely decompressed. The terminal ileum is not discretely visible. Cecal tip is in the lateral right lower quadrant on the previous study and is now in the midline abdomen just deep to the umbilicus (image 14/7. There is pericolonic edema/inflammation associated with the cecum and ascending colon. Pneumatosis is noted in the cecum and ascending colon. No wall thickening or pneumatosis in the hepatic flexure, transverse colon, or left colon. Diffuse colonic diverticulosis evident. Vascular/Lymphatic: There is moderate atherosclerotic calcification of the abdominal aorta without aneurysm. Portal vein and superior mesenteric vein are patent. No evidence for portal venous gas. Celiac axis, SMA, and IMA are opacified. There is no gastrohepatic or hepatoduodenal ligament lymphadenopathy. No retroperitoneal or mesenteric lymphadenopathy. No pelvic sidewall lymphadenopathy. Reproductive: Small posterior uterine fibroid evident. There is no adnexal mass. Other: Small volume free fluid is seen in the cul-de-sac in the para colic gutters bilaterally. Musculoskeletal: No worrisome lytic or sclerotic osseous abnormality. Body wall edema noted lower abdomen and pelvis. IMPRESSION: 1. Interval development of apparent pneumatosis involving the cecum and ascending colon. Imaging features concerning for ischemic colitis. While the apparent pneumatosis is best appreciated in segments of colon with adjacent intraluminal fluid, given the adjacent pericolonic edema/inflammation, pneumatosis is favored over gas trapped between intraluminal contents and the mucosal surface. Cecum is mobile and while it was in the lower right lower quadrant on the study from 1 week ago, it is now positioned in the midline upper pelvis/abdomen. No luminal narrowing in the cecum or  ascending colon to confirm the presence of a cecal volvulus. 2. Small bowel loops in the right lower quadrant are decompressed while proximal and mid small bowel is fluid-filled and distended up to 3.4 cm. Although no discrete transition zone is identified, given the markedly decompressed distal small bowel the right lower quadrant, associated small bowel obstruction is a concern. Inflammatory ileus secondary to the right colonic process would also be a consideration. Right lower quadrant internal hernia not excluded. 3. Marked distention of the stomach with fluid. Tube tip is in the antral region adjacent to the pylorus. 4. Collapse/consolidation in the right lower lung persists but is improved since prior study. 5. Small volume free fluid in the cul-de-sac and para colic gutters bilaterally. 6. Aortic Atherosclerosis (ICD10-I70.0). These results will be called to the ordering clinician or representative by the Radiologist Assistant, and communication documented in the PACS or [employer]. Electronically Signed   By: Misty Stanley M.D.   On: 01/10/2022 14:02     Patient Profile     81 y.o. female with a hx of atrial fib on propranolol 80 mg qd and Xarelto, HTN, asthma, breast CA, anxiety, who is being seen 01/02/2022 for the evaluation of CHF & rapid atrial fib at the request of Dr Marthenia Rolling.  Patient was admitted July 28 with probable pneumonia. Patient suffered a  PEA arrest on July 30 requiring 7 minutes of CPR.  Patient ultimately required chest tube placement for parapneumonic effusion.  Echocardiogram this admission shows ejection fraction 25 to 30%.  The mid/apical segments are akinetic and findings felt consistent with Takotsubo cardiomyopathy.  There was mild left atrial enlargement, moderate right atrial enlargement, moderate mitral regurgitation, mild to moderate tricuspid regurgitation, mild to moderate aortic insufficiency.  Taken to the OR August 7 and found to have ischemic portion of her cecum  with contained perforation; had resection of right colon with ileostomy.  Assessment & Plan    1 permanent atrial fibrillation-heart rate is controlled this morning.  Continue amiodarone.  Can resume anticoagulation later when it is clear hemostasis is achieved.  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  We will consider addition of guideline directed medical therapy later if when blood pressure improves.  She will need follow-up echocardiogram as well.    3 status post resection of ischemic portion of cecum with contained perforation-General surgery following.  4 pneumonia/parapneumonic effusion-Per primary service.  Continue antibiotics.  5 valvular heart disease including moderate mitral regurgitation, mild to moderate aortic insufficiency-patient will need follow-up echoes in the future.  6 status post PEA arrest-this was felt secondary to respiratory distress.  For questions or updates, please contact [place] Please consult [email or web address] for contact info under        Signed, Kirk Ruths, MD  01/12/2022, 6:26 AM

## 2022-01-12 NOTE — Progress Notes (Addendum)
[facility] Progress Note Patient Name: [name] DOB: 1941-03-29 MRN: [id number]   Date of Service  01/12/2022  HPI/Events of Note  Oliguria. On NS at 100 cc/hr and 11 mic/min Levophed. On 40% fio2.   eICU Interventions  500 cc LR over 1 hour ordered     Intervention Category Intermediate Interventions: Oliguria - evaluation and management  Laverna Dossett G Thanvi Blincoe 01/12/2022, 1:10 AM  Addendum at 5:45 am Notified of no urine output  Labs were done and creatinine stable  Has been on NS at 100 cc/hour Has a PA cath in but RN says numbers not being actively used Bladder scan done, did not show urine  CVP is 17-18 RN started her shift with 12 mic/min levophed and vaso, is currently at 10 mic/min with vaso Noted EF in 25-30 range and with this, may consider diuresis today but I am going to stop the fluids for now  Check Co ox  HR is well controlled so depending on how she does, may need to consider inotrope +/- diuretic today Cardiology also following O2 sat is 99 on minimal fio2

## 2022-01-12 NOTE — Progress Notes (Signed)
Peripherally Inserted Central Catheter Placement  The IV Nurse has discussed with the patient and/or persons authorized to consent for the patient, the purpose of this procedure and the potential benefits and risks involved with this procedure.  The benefits include less needle sticks, lab draws from the catheter, and the patient may be discharged home with the catheter. Risks include, but not limited to, infection, bleeding, blood clot (thrombus formation), and puncture of an artery; nerve damage and irregular heartbeat and possibility to perform a PICC exchange if needed/ordered by physician.  Alternatives to this procedure were also discussed.  B[employer] PICC patient education guide, fact sheet on infection prevention and patient information card has been provided to patient /or left at bedside.    PICC Placement Documentation  PICC Triple Lumen 23/95/32 Left Basilic 45 cm 0 cm (Active)  Indication for Insertion or Continuance of Line Administration of hyperosmolar/irritating solutions (i.e. TPN, Vancomycin, etc.) 01/12/22 1200  Exposed Catheter (cm) 0 cm 01/12/22 1200  Site Assessment Clean, Dry, Intact 01/12/22 1200  Lumen #1 Status Flushed;Saline locked;Blood return noted 01/12/22 1200  Lumen #2 Status Flushed;Saline locked;Blood return noted 01/12/22 1200  Lumen #3 Status Flushed;Saline locked;Blood return noted 01/12/22 1200  Dressing Type Transparent;Securing device 01/12/22 1200  Dressing Status Clean, Dry, Intact;Allergy to antimicrobial disc 01/12/22 1200  Dressing Intervention New dressing 01/12/22 1200  Dressing Change Due 01/19/22 01/12/22 1200    Husband signed consent at bedside   Synthia Innocent 01/12/2022, 12:28 PM

## 2022-01-12 NOTE — Progress Notes (Signed)
PHARMACY - TOTAL PARENTERAL NUTRITION CONSULT NOTE   Indication: bowel perf s/p repair with open abdomen, malnourished and npo   Patient Measurements: Height: 5' (152.4 cm) Weight: 73.3 kg (161 lb 9.6 oz) IBW/kg (Calculated) : 45.5 TPN AdjBW (KG): 51 Body mass index is 31.56 kg/m.  Assessment: 81 yo female admited for CAP causing respiratory failure, had a cardiac arrest after admission and was moved to the ICU.  Chest tube placed for parapneumonic effusion, extubated on 8/2. Sent out of ICU on 8/5. 8/6 developed ischemic colitis initially treated conservatively but this morning we were called back 8/7 for worsening shock, confusion.  POD #1 s/p ex lap, R colectomy for ischemic bowel with perforation. Pharmacy consulted to start TPN.  Glucose / Insulin: 18 units of SSI on sensitive SSI Electrolytes: Na 132, K 3.6, Cl 101, CO2 23, Mag 1.8 Renal: BUN 20, Scr 0.61 Hepatic: Albumin < 1.5 Intake / Output; MIVF: 0.2 ml/kg/hr, drains 450 mls GI Imaging: GI Surgeries / Procedures:  8/7 Ex lap, colon resection  Central access: PICC ordered for 8/3 TPN start date: Plan to start 8/9  Nutritional Goals: Goal TPN rate is 63 mL/hr (provides 80 g of protein and 1500 kcals per day)  RD Assessment: Estimated Needs Total Energy Estimated Needs: 1400-1600 kcals Total Protein Estimated Needs: 75-85 g Total Fluid Estimated Needs: >/= 1.5 L  Current Nutrition:  NPO  Plan:  Plan to start TPN tomorrow pm Will Monitor TPN labs on Mon/Thurs, and daily during initial titration  Alanda Slim, PharmD, [facility] Clinical Pharmacist Please see AMION for all Pharmacists' Contact Phone Numbers 01/12/2022, 11:24 AM

## 2022-01-12 NOTE — Progress Notes (Signed)
SLP Cancellation Note  Patient Details Name: [name] MRN: [id number] DOB: 01/22/1941   Cancelled treatment:       Reason Eval/Treat Not Completed: Medical issues which prohibited therapy;Other (comment) (patient is intubated and sedated. SLP will follow for readiness)   Sonia Baller, MA, CCC-SLP Speech Therapy

## 2022-01-12 NOTE — Progress Notes (Addendum)
1 Day Post-Op   Subjective/Chief Complaint: Pt with no acute changes overnight Pressor support decreasing    Objective: Vital signs in last 24 hours: Temp:  [96.8 F (36 C)-99.1 F (37.3 C)] 97.5 F (36.4 C) (08/08 0700) Pulse Rate:  [58-147] 63 (08/08 0700) Resp:  [16-23] 16 (08/08 0700) BP: (109-141)/(50-86) 133/65 (08/08 0700) SpO2:  [83 %-100 %] 100 % (08/08 0749) Arterial Line BP: (79-156)/(45-91) 145/62 (08/08 0700) FiO2 (%):  [40 %] 40 % (08/08 0749) Weight:  [73.3 kg] 73.3 kg (08/08 0500) Last BM Date : 01/06/22  Intake/Output from previous day: 08/07 0701 - 08/08 0700 In: 5777.8 [I.V.:4947.5; IV Piggyback:830.3] Out: 1200 [Urine:400; Emesis/NG output:200; Drains:500; Blood:100] Intake/Output this shift: No intake/output data recorded. PE:  Constitutional: No acute distress, sedated, appears states age. Eyes: Anicteric sclerae, moist conjunctiva, no lid lag Lungs: Clear to auscultation bilaterally, normal respiratory effort on vent CV: regular rate and rhythm, no murmurs, no peripheral edema, pedal pulses 2+ GI: Soft,Vac in place Skin: No rashes, palpation reveals normal turgor   Lab Results:  Recent Labs    01/28/2022 0041 01/14/2022 1102 01/12/22 0341 01/12/22 0454 01/12/22 0650  WBC 29.3*  --  23.6*  --   --   HGB 12.1   < > 10.2* 10.5* 9.5*  HCT 37.0   < > 30.5* 31.0* 28.0*  PLT 232  --  268  --   --    < > = values in this interval not displayed.   BMET Recent Labs    01/28/2022 1409 01/12/22 0341 01/12/22 0454 01/12/22 0650  NA 136 132* 133* 132*  K 4.4 3.9 4.0 3.6  CL 102 101  --   --   CO2 27 23  --   --   GLUCOSE 208* 255*  --   --   BUN 17 20  --   --   CREATININE 0.59 0.61  --   --   CALCIUM 8.8* 8.4*  --   --    PT/INR No results for input(s): "LABPROT", "INR" in the last 72 hours. ABG Recent Labs    01/08/2022 1305 01/12/22 0454 01/12/22 0650  PHART 7.437 7.410  --   HCO3 28.5* 26.4 23.6    Studies/Results: DG CHEST PORT 1  VIEW  Result Date: 01/12/2022 CLINICAL DATA:  Endotracheal tube placement, recent abdominal surgery, respiratory distress EXAM: PORTABLE CHEST 1 VIEW COMPARISON:  01/31/2022 FINDINGS: Endotracheal tube is 1.5 cm above the carina. Right IJ Swan-Ganz catheter tip overlies the main pulmonary artery. Enteric tube passes into the stomach. Persistent elevation of the right hemidiaphragm. Probable right larger than left pleural effusions with bibasilar atelectasis/consolidation. No pneumothorax. Similar cardiomediastinal contours. IMPRESSION: Lines and tubes as above. Probable right larger than left pleural effusions with bibasilar atelectasis/consolidation. Electronically Signed   By: Macy Mis M.D.   On: 01/12/2022 08:05   DG CHEST PORT 1 VIEW  Result Date: 01/05/2022 CLINICAL DATA:  Respiratory failure.  Endotracheal tube placement. EXAM: PORTABLE CHEST 1 VIEW COMPARISON:  January 11, 2022. FINDINGS: Endotracheal tube is noted in grossly good position. Nasogastric tube is seen with tip in proximal stomach. Right internal jugular Swan-Ganz catheter is now noted with tip in expected position of main pulmonary artery. Stable right basilar atelectasis or infiltrate is noted with associated effusion. Small left pleural effusion is noted. Bony thorax is unremarkable. IMPRESSION: Endotracheal and nasogastric tubes are in grossly good position. Right internal jugular Swan-Ganz catheter is noted with tip in expected position of main  pulmonary artery. Electronically Signed   By: Marijo Conception M.D.   On: 01/24/2022 13:10   DG Abd Portable 1V  Result Date: 01/24/2022 CLINICAL DATA:  Ileus. EXAM: PORTABLE ABDOMEN - 1 VIEW COMPARISON:  01/10/2022 FINDINGS: NG tube is been removed in the interval. Similar appearance of diffuse gaseous small bowel and colonic distension. Contrast in the bladder is compatible with recent CT scan. IMPRESSION: Similar appearance of diffuse gaseous small bowel and colonic distension. Interval  removal of NG tube. Electronically Signed   By: Misty Stanley M.D.   On: 01/07/2022 07:36   DG CHEST PORT 1 VIEW  Result Date: 01/29/2022 CLINICAL DATA:  NG tube placement. EXAM: PORTABLE CHEST 1 VIEW COMPARISON:  Earlier same day FINDINGS: 0618 hours. Stable asymmetric elevation right hemidiaphragm with right base collapse/consolidation and small right effusion. Minimal atelectasis noted left base with possible tiny left pleural effusion. The cardio pericardial silhouette is enlarged. NG tube tip is in the stomach with proximal side port below the GE junction. Telemetry leads overlie the chest. IMPRESSION: NG tube tip is in the stomach with proximal side port below the GE junction. Electronically Signed   By: Misty Stanley M.D.   On: 01/16/2022 06:33   DG CHEST PORT 1 VIEW  Result Date: 01/08/2022 CLINICAL DATA:  NG tube placement. EXAM: PORTABLE CHEST 1 VIEW COMPARISON:  01/10/2022 FINDINGS: 0610 hours. NG tube tip is positioned at the level of the mid esophagus. Asymmetric elevation right hemidiaphragm noted with right base collapse/consolidation and small right pleural effusion. Minimal atelectasis noted left base with tiny left effusion. The cardio pericardial silhouette is enlarged. Bones are diffusely demineralized. Telemetry leads overlie the chest. IMPRESSION: 1. NG tube tip positioned at the level of the mid esophagus. 2. Asymmetric elevation right hemidiaphragm with right base collapse/consolidation and small right pleural effusion. Electronically Signed   By: Misty Stanley M.D.   On: 01/24/2022 06:33   DG Chest Port 1 View  Result Date: 01/10/2022 CLINICAL DATA:  Small bowel disease. Respiratory distress. EXAM: PORTABLE CHEST 1 VIEW COMPARISON:  January 06, 2022 FINDINGS: Enteric catheter curled on itself with tip collimated off the image at the level of the mid gastric body. Enlarged cardiac silhouette. Right pleural effusion and right lower lung field airspace consolidation. Mild atelectasis of  the left lower lung. Osseous structures are without acute abnormality. Soft tissues are grossly normal. IMPRESSION: 1. Enteric catheter folded on itself with tip collimated off the image at the level of the mid gastric body. 2. Right pleural effusion and right lower lung field airspace consolidation. Electronically Signed   By: Fidela Salisbury M.D.   On: 01/10/2022 16:33   DG Abd Portable 1V  Result Date: 01/10/2022 CLINICAL DATA:  NG tube placement. EXAM: PORTABLE ABDOMEN - 1 VIEW COMPARISON:  01/10/2022 FINDINGS: Enteric feeding tube has been removed. A nasal/orogastric tube passes well below the diaphragm, tip projecting in the distal stomach. Dilated loops of small bowel noted centrally, unchanged from the earlier study. IMPRESSION: Well-positioned nasal/orogastric tube. Electronically Signed   By: Lajean Manes M.D.   On: 01/10/2022 16:32   CT ABDOMEN PELVIS W CONTRAST  Result Date: 01/10/2022 CLINICAL DATA:  Bowel obstruction.  Abdominal pain. EXAM: CT ABDOMEN AND PELVIS WITH CONTRAST TECHNIQUE: Multidetector CT imaging of the abdomen and pelvis was performed using the standard protocol following bolus administration of intravenous contrast. RADIATION DOSE REDUCTION: This exam was performed according to the departmental dose-optimization program which includes automated exposure control, adjustment of the  mA and/or kV according to patient size and/or use of iterative reconstruction technique. CONTRAST:  33m OMNIPAQUE IOHEXOL 300 MG/ML  SOLN COMPARISON:  Chest abdomen pelvis CT 01/03/2022 FINDINGS: Lower chest: Collapse/consolidation in the right lower lung persists but is improved since prior study. Small right pleural effusion noted. The cardio pericardial silhouette is enlarged. Hepatobiliary: No suspicious focal abnormality within the liver parenchyma. There is no evidence for gallstones, gallbladder wall thickening, or pericholecystic fluid. No intrahepatic or extrahepatic biliary dilation.  Pancreas: No focal mass lesion. No dilatation of the main duct. No intraparenchymal cyst. No peripancreatic edema. Spleen: No splenomegaly. No focal mass lesion. Adrenals/Urinary Tract: No adrenal nodule or mass. Cortical scarring noted in both kidneys. No hydronephrosis tiny hypodensities in both kidneys are too small to characterize but likely benign. Stomach/Bowel: Stomach is markedly distended with fluid. Feeding tube tip is in the antral region adjacent to the pylorus. Duodenum is normally positioned as is the ligament of Treitz. Small bowel loops are fluid-filled and distended up to 3.4 cm diameter. Small bowel loops in the right lower quadrant a completely decompressed. The terminal ileum is not discretely visible. Cecal tip is in the lateral right lower quadrant on the previous study and is now in the midline abdomen just deep to the umbilicus (image 616/1 There is pericolonic edema/inflammation associated with the cecum and ascending colon. Pneumatosis is noted in the cecum and ascending colon. No wall thickening or pneumatosis in the hepatic flexure, transverse colon, or left colon. Diffuse colonic diverticulosis evident. Vascular/Lymphatic: There is moderate atherosclerotic calcification of the abdominal aorta without aneurysm. Portal vein and superior mesenteric vein are patent. No evidence for portal venous gas. Celiac axis, SMA, and IMA are opacified. There is no gastrohepatic or hepatoduodenal ligament lymphadenopathy. No retroperitoneal or mesenteric lymphadenopathy. No pelvic sidewall lymphadenopathy. Reproductive: Small posterior uterine fibroid evident. There is no adnexal mass. Other: Small volume free fluid is seen in the cul-de-sac in the para colic gutters bilaterally. Musculoskeletal: No worrisome lytic or sclerotic osseous abnormality. Body wall edema noted lower abdomen and pelvis. IMPRESSION: 1. Interval development of apparent pneumatosis involving the cecum and ascending colon. Imaging  features concerning for ischemic colitis. While the apparent pneumatosis is best appreciated in segments of colon with adjacent intraluminal fluid, given the adjacent pericolonic edema/inflammation, pneumatosis is favored over gas trapped between intraluminal contents and the mucosal surface. Cecum is mobile and while it was in the lower right lower quadrant on the study from 1 week ago, it is now positioned in the midline upper pelvis/abdomen. No luminal narrowing in the cecum or ascending colon to confirm the presence of a cecal volvulus. 2. Small bowel loops in the right lower quadrant are decompressed while proximal and mid small bowel is fluid-filled and distended up to 3.4 cm. Although no discrete transition zone is identified, given the markedly decompressed distal small bowel the right lower quadrant, associated small bowel obstruction is a concern. Inflammatory ileus secondary to the right colonic process would also be a consideration. Right lower quadrant internal hernia not excluded. 3. Marked distention of the stomach with fluid. Tube tip is in the antral region adjacent to the pylorus. 4. Collapse/consolidation in the right lower lung persists but is improved since prior study. 5. Small volume free fluid in the cul-de-sac and para colic gutters bilaterally. 6. Aortic Atherosclerosis (ICD10-I70.0). These results will be called to the ordering clinician or representative by the Radiologist Assistant, and communication documented in the PACS or [employer] Electronically Signed  By: Misty Stanley M.D.   On: 01/10/2022 14:02    Anti-infectives: Anti-infectives (From admission, onward)    Start     Dose/Rate Route Frequency Ordered Stop   01/24/2022 1400  micafungin (MYCAMINE) 100 mg in sodium chloride 0.9 % 100 mL IVPB        100 mg 105 mL/hr over 1 Hours Intravenous Every 24 hours 01/24/2022 1302     01/10/22 1515  piperacillin-tazobactam (ZOSYN) IVPB 3.375 g        3.375 g 12.5 mL/hr over  240 Minutes Intravenous Every 8 hours 01/10/22 1427     01/04/22 2000  vancomycin (VANCOCIN) IVPB 1000 mg/200 mL premix  Status:  Discontinued        1,000 mg 200 mL/hr over 60 Minutes Intravenous Every 24 hours 01/03/22 1910 01/05/22 1321   01/03/22 2200  ceFEPIme (MAXIPIME) 2 g in sodium chloride 0.9 % 100 mL IVPB        2 g 200 mL/hr over 30 Minutes Intravenous Every 12 hours 01/03/22 1910 01/08/22 2209   01/03/22 1930  vancomycin (VANCOREADY) IVPB 1250 mg/250 mL        1,250 mg 166.7 mL/hr over 90 Minutes Intravenous  Once 01/03/22 1910 01/03/22 2219   01/02/22 2100  azithromycin (ZITHROMAX) 500 mg in sodium chloride 0.9 % 250 mL IVPB        500 mg 250 mL/hr  Intravenous Every 24 hours 12/14/2021 2248 01/06/22 2149   01/02/22 2000  cefTRIAXone (ROCEPHIN) 2 g in sodium chloride 0.9 % 100 mL IVPB  Status:  Discontinued        2 g 200 mL/hr over 30 Minutes Intravenous Every 24 hours 01/03/2022 2248 01/03/22 1733   12/07/2021 2030  cefTRIAXone (ROCEPHIN) 1 g in sodium chloride 0.9 % 100 mL IVPB        1 g 200 mL/hr over 30 Minutes Intravenous  Once 01/04/2022 2025 01/03/2022 2141   12/10/2021 2030  azithromycin (ZITHROMAX) 500 mg in sodium chloride 0.9 % 250 mL IVPB        500 mg 250 mL/hr over 60 Minutes Intravenous  Once 12/17/2021 2025 12/23/2021 2312       Assessment/Plan: POD #1 s/p ex lap, 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

## 2022-01-12 NOTE — Progress Notes (Signed)
Inpatient Diabetes Program Recommendations  AACE/ADA: New Consensus Statement on Inpatient Glycemic Control (2015)  Target Ranges:  Prepandial:   less than 140 mg/dL      Peak postprandial:   less than 180 mg/dL (1-2 hours)      Critically ill patients:  140 - 180 mg/dL   Lab Results  Component Value Date   GLUCAP 197 (H) 01/12/2022   HGBA1C 4.9 01/03/2022    Review of Glycemic Control  Latest Reference Range & Units 01/26/2022 12:15 01/16/2022 15:28 01/19/2022 20:01 01/12/22 00:36 01/12/22 03:49 01/12/22 07:40  Glucose-Capillary 70 - 99 mg/dL 129 (H) 237 (H) 282 (H) 258 (H) 248 (H) 197 (H)   Diabetes history: None Current orders for Inpatient glycemic control:  Novolog 0-9 units q 4 hours  Inpatient Diabetes Program Recommendations:    Note blood sugars increased overnight, however patient did receive Decadron 5 mg x1 IV pre-op.  Therefore anticipate blood sugars will improve.   Thanks,  Adah Perl, RN, BC-ADM Inpatient Diabetes Coordinator Pager [phone]  (8a-5p)

## 2022-01-12 NOTE — Progress Notes (Signed)
Nutrition Follow-up  DOCUMENTATION CODES:   Not applicable  INTERVENTION:   Recommend initiation of TPN at this time; Discussed with Dr. Tamala Julian who agrees. Pharmacy order placed Estimated Calorie and protein needs increased Currently receiving lipid calories from Propofol infusion; no lipids needed in TPN until off propofol. Currently rate of 16.7 ml/hr provides estimated 440 kcals/24 hours.  Recommend concentrated TPN initially to limit volume given pt oliguric currently, weight up post op, +edema   NUTRITION DIAGNOSIS:   Inadequate oral intake related to acute illness as evidenced by NPO status.  Continues but being addressed via nutrition support  GOAL:   Patient will meet greater than or equal to 90% of their needs  Progressing  MONITOR:   Vent status, Labs, Weight trends, I & O's (TPN, GI function)  REASON FOR ASSESSMENT:   Consult, Ventilator Enteral/tube feeding initiation and management  ASSESSMENT:   81 yo female admitted with acute respiratory failure related to COPD exacerbation and pneumonia, went into PEA arrest and required intubation. PMH includes A.fib, breast cancer, HTN, GERD, asthma, melanoma  7/28 Admit 7/30 IHCA x 7 minutes, Intubated 7/31 Chest tube placed, TF initiatede 8/01 Pt pulled out chest tube, CT head negative 8/02 Extubated 8/04 Cortrak placed, Cortrak pulled out by pt and replaced again, TF started 8/06 Abd pain/Abd fullness; KUB suggestive of ileus, TF on hold, Surgery consulted, CT abd with ischemic colitis, NG tube to LIS, Cortrak removed 8/07 Febrile, worsening abd pain, more somnolent, OR for ex lap and colon resection due to ischemic colon with contained perf, feculent peritonitis, abthera wound VAC placed, intubated  Pt remains on vent support-noted plan to return to OR tomorrow pending pressor requirements  Drips: Pressor requirements down from yesterday Levophed (down to 7 mcg/min) Vasopressin (0.03 units/min) Propofol 16.7  ml/hr (440 kcals in 24 hours)  Abdomen open, Abthera wound VAC with 500 mL No BM since 8/02, NG to LIS  No nutrition since 8/06 with other periods of time with inadequate nutrition since admission. Recommend TPN initiation given pt is at very high nutritional risk and suspect that pt will not be able to obtain adequate nutrition via enteral route for a while.   Oliguric over the past 24 hours; net +3.5 L Weight up post op; current wt 73.3 kg. Admit wt 63.8 kg. Weight had been hovering around 67 kg with wt 69.5 kg prior to surgery/ +edema on exam  Corrected calcium >/= 10.5, albumin <1.5. Ionized calcium wdl. Phosphorus not checked, placed order for phosphorus (pull off AM labs) prior to initiation of TPN  Labs: sodium 132 (L), magnesium wdl, potassium wdl, TG 176 Meds: ss novolog   Diet Order:   Diet Order             Diet NPO time specified  Diet effective now                   EDUCATION NEEDS:   Not appropriate for education at this time  Skin:  Skin Assessment: Skin Integrity Issues: Skin Integrity Issues:: Wound VAC Wound Vac: open abdomen  Last BM:  8/02 type 7 medium  Height:   Ht Readings from Last 1 Encounters:  01/04/22 5' (1.524 m)    Weight:   Wt Readings from Last 1 Encounters:  01/12/22 73.3 kg     BMI:  Body mass index is 31.56 kg/m.  Estimated Nutritional Needs:   Kcal:  1500-1700 kcals  Protein:  85-100 g  Fluid:  >/= 1.5 L  Kerman Passey MS, RDN, LDN, CNSC Registered Dietitian 3 Clinical Nutrition RD Pager and On-Call Pager Number Located in [place]

## 2022-01-13 ENCOUNTER — Inpatient Hospital Stay (HOSPITAL_COMMUNITY): Payer: Medicare Other | Admitting: Certified Registered"

## 2022-01-13 ENCOUNTER — Inpatient Hospital Stay (HOSPITAL_COMMUNITY): Payer: Medicare Other

## 2022-01-13 ENCOUNTER — Encounter (HOSPITAL_COMMUNITY): Payer: Self-pay | Admitting: Internal Medicine

## 2022-01-13 ENCOUNTER — Encounter (HOSPITAL_COMMUNITY): Admission: EM | Disposition: E | Payer: Self-pay | Source: Home / Self Care | Attending: Internal Medicine

## 2022-01-13 ENCOUNTER — Other Ambulatory Visit: Payer: Self-pay

## 2022-01-13 DIAGNOSIS — I469 Cardiac arrest, cause unspecified: Secondary | ICD-10-CM | POA: Diagnosis not present

## 2022-01-13 DIAGNOSIS — K559 Vascular disorder of intestine, unspecified: Secondary | ICD-10-CM | POA: Diagnosis not present

## 2022-01-13 DIAGNOSIS — I5181 Takotsubo syndrome: Secondary | ICD-10-CM | POA: Diagnosis not present

## 2022-01-13 DIAGNOSIS — J9601 Acute respiratory failure with hypoxia: Secondary | ICD-10-CM | POA: Diagnosis not present

## 2022-01-13 DIAGNOSIS — I10 Essential (primary) hypertension: Secondary | ICD-10-CM | POA: Diagnosis not present

## 2022-01-13 DIAGNOSIS — K631 Perforation of intestine (nontraumatic): Secondary | ICD-10-CM | POA: Diagnosis not present

## 2022-01-13 DIAGNOSIS — J189 Pneumonia, unspecified organism: Secondary | ICD-10-CM | POA: Diagnosis not present

## 2022-01-13 DIAGNOSIS — I4891 Unspecified atrial fibrillation: Secondary | ICD-10-CM | POA: Diagnosis not present

## 2022-01-13 HISTORY — PX: LAPAROTOMY: SHX154

## 2022-01-13 LAB — COMPREHENSIVE METABOLIC PANEL
ALT: 27 U/L (ref 0–44)
AST: 24 U/L (ref 15–41)
Albumin: 2.1 g/dL — ABNORMAL LOW (ref 3.5–5.0)
Alkaline Phosphatase: 60 U/L (ref 38–126)
Anion gap: 6 (ref 5–15)
BUN: 19 mg/dL (ref 8–23)
CO2: 24 mmol/L (ref 22–32)
Calcium: 8.6 mg/dL — ABNORMAL LOW (ref 8.9–10.3)
Chloride: 102 mmol/L (ref 98–111)
Creatinine, Ser: 0.57 mg/dL (ref 0.44–1.00)
GFR, Estimated: >60 mL/min (ref >60)
Glucose, Bld: 86 mg/dL (ref 70–99)
Potassium: 3.8 mmol/L (ref 3.5–5.1)
Sodium: 132 mmol/L — ABNORMAL LOW (ref 135–145)
Total Bilirubin: 1.2 mg/dL (ref 0.3–1.2)
Total Protein: 4.5 g/dL — ABNORMAL LOW (ref 6.5–8.1)

## 2022-01-13 LAB — GLUCOSE, CAPILLARY
Glucose-Capillary: 74 mg/dL (ref 70–99)
Glucose-Capillary: 74 mg/dL (ref 70–99)
Glucose-Capillary: 80 mg/dL (ref 70–99)
Glucose-Capillary: 90 mg/dL (ref 70–99)
Glucose-Capillary: 92 mg/dL (ref 70–99)
Glucose-Capillary: 92 mg/dL (ref 70–99)

## 2022-01-13 LAB — CBC
HCT: 25.1 % — ABNORMAL LOW (ref 36.0–46.0)
Hemoglobin: 8.4 g/dL — ABNORMAL LOW (ref 12.0–15.0)
MCH: 34.3 pg — ABNORMAL HIGH (ref 26.0–34.0)
MCHC: 33.5 g/dL (ref 30.0–36.0)
MCV: 102.4 fL — ABNORMAL HIGH (ref 80.0–100.0)
Platelets: 159 10*3/uL (ref 150–400)
RBC: 2.45 MIL/uL — ABNORMAL LOW (ref 3.87–5.11)
RDW: 17.9 % — ABNORMAL HIGH (ref 11.5–15.5)
WBC: 11.5 10*3/uL — ABNORMAL HIGH (ref 4.0–10.5)
nRBC: 0 % (ref 0.0–0.2)

## 2022-01-13 LAB — CBC WITH DIFFERENTIAL/PLATELET
Abs Immature Granulocytes: 0.15 10*3/uL — ABNORMAL HIGH (ref 0.00–0.07)
Basophils Absolute: 0 10*3/uL (ref 0.0–0.1)
Basophils Relative: 0 %
Eosinophils Absolute: 0 10*3/uL (ref 0.0–0.5)
Eosinophils Relative: 0 %
HCT: 20.9 % — ABNORMAL LOW (ref 36.0–46.0)
Hemoglobin: 6.9 g/dL — CL (ref 12.0–15.0)
Immature Granulocytes: 1 %
Lymphocytes Relative: 5 %
Lymphs Abs: 0.5 10*3/uL — ABNORMAL LOW (ref 0.7–4.0)
MCH: 35.6 pg — ABNORMAL HIGH (ref 26.0–34.0)
MCHC: 33 g/dL (ref 30.0–36.0)
MCV: 107.7 fL — ABNORMAL HIGH (ref 80.0–100.0)
Monocytes Absolute: 0.8 10*3/uL (ref 0.1–1.0)
Monocytes Relative: 7 %
Neutro Abs: 9.7 10*3/uL — ABNORMAL HIGH (ref 1.7–7.7)
Neutrophils Relative %: 87 %
Platelets: 143 10*3/uL — ABNORMAL LOW (ref 150–400)
RBC: 1.94 MIL/uL — ABNORMAL LOW (ref 3.87–5.11)
RDW: 13 % (ref 11.5–15.5)
WBC: 11.2 10*3/uL — ABNORMAL HIGH (ref 4.0–10.5)
nRBC: 0 % (ref 0.0–0.2)

## 2022-01-13 LAB — SURGICAL PATHOLOGY

## 2022-01-13 LAB — APTT: aPTT: 30 seconds (ref 24–36)

## 2022-01-13 LAB — PHOSPHORUS: Phosphorus: 3.1 mg/dL (ref 2.5–4.6)

## 2022-01-13 LAB — TRIGLYCERIDES: Triglycerides: 116 mg/dL (ref <150)

## 2022-01-13 LAB — MAGNESIUM: Magnesium: 1.9 mg/dL (ref 1.7–2.4)

## 2022-01-13 LAB — PREPARE RBC (CROSSMATCH)

## 2022-01-13 SURGERY — LAPAROTOMY, EXPLORATORY
Anesthesia: General | Site: Abdomen

## 2022-01-13 MED ORDER — MIDAZOLAM HCL 2 MG/2ML IJ SOLN
INTRAMUSCULAR | Status: AC
  Filled 2022-01-13: qty 2

## 2022-01-13 MED ORDER — LACTATED RINGERS IV SOLN: INTRAVENOUS | Status: DC | PRN

## 2022-01-13 MED ORDER — ROCURONIUM BROMIDE 10 MG/ML (PF) SYRINGE
PREFILLED_SYRINGE | INTRAVENOUS | Status: AC
  Filled 2022-01-13: qty 10

## 2022-01-13 MED ORDER — FENTANYL CITRATE (PF) 250 MCG/5ML IJ SOLN
INTRAMUSCULAR | Status: AC
  Filled 2022-01-13: qty 5

## 2022-01-13 MED ORDER — PROPOFOL 10 MG/ML IV BOLUS
INTRAVENOUS | Status: AC
  Filled 2022-01-13: qty 20

## 2022-01-13 MED ORDER — MIDAZOLAM HCL 2 MG/2ML IJ SOLN
INTRAMUSCULAR | Status: DC | PRN
  Administered 2022-01-13: 2 mg via INTRAVENOUS

## 2022-01-13 MED ORDER — ONDANSETRON HCL 4 MG/2ML IJ SOLN: 4.0000 mg | Freq: Once | INTRAMUSCULAR | Status: DC | PRN

## 2022-01-13 MED ORDER — MAGNESIUM SULFATE 2 GM/50ML IV SOLN
2.0000 g | Freq: Once | INTRAVENOUS | Status: AC
  Administered 2022-01-13: 2 g via INTRAVENOUS
  Filled 2022-01-13: qty 50

## 2022-01-13 MED ORDER — TRAVASOL 10 % IV SOLN
INTRAVENOUS | Status: AC
  Filled 2022-01-13: qty 403.2

## 2022-01-13 MED ORDER — 0.9 % SODIUM CHLORIDE (POUR BTL) OPTIME
TOPICAL | Status: DC | PRN
  Administered 2022-01-13 (×6): 1000 mL

## 2022-01-13 MED ORDER — ROCURONIUM BROMIDE 10 MG/ML (PF) SYRINGE
PREFILLED_SYRINGE | INTRAVENOUS | Status: DC | PRN
  Administered 2022-01-13: 100 mg via INTRAVENOUS

## 2022-01-13 MED ORDER — EPHEDRINE 5 MG/ML INJ
INTRAVENOUS | Status: AC
  Filled 2022-01-13: qty 5

## 2022-01-13 MED ORDER — FENTANYL CITRATE (PF) 250 MCG/5ML IJ SOLN
INTRAMUSCULAR | Status: DC | PRN
  Administered 2022-01-13: 100 ug via INTRAVENOUS
  Administered 2022-01-13 (×3): 50 ug via INTRAVENOUS

## 2022-01-13 MED ORDER — WHITE PETROLATUM EX OINT
TOPICAL_OINTMENT | CUTANEOUS | Status: DC | PRN
Start: 2022-01-13 — End: 2022-02-11
  Filled 2022-01-13 (×3): qty 28.35

## 2022-01-13 MED ORDER — SODIUM CHLORIDE 0.9% IV SOLUTION: Freq: Once | INTRAVENOUS | Status: AC

## 2022-01-13 MED ORDER — FENTANYL CITRATE (PF) 100 MCG/2ML IJ SOLN: 25.0000 ug | INTRAMUSCULAR | Status: DC | PRN

## 2022-01-13 SURGICAL SUPPLY — 49 items
BNDG GAUZE ELAST 4 BULKY (GAUZE/BANDAGES/DRESSINGS) ×1 IMPLANT
CANISTER SUCT 3000ML PPV (MISCELLANEOUS) ×4 IMPLANT
COVER SURGICAL LIGHT HANDLE (MISCELLANEOUS) ×4 IMPLANT
DRAPE LAPAROSCOPIC ABDOMINAL (DRAPES) ×3 IMPLANT
DRAPE WARM FLUID 44X44 (DRAPES) ×3 IMPLANT
DRSG OPSITE POSTOP 4X10 (GAUZE/BANDAGES/DRESSINGS) IMPLANT
DRSG OPSITE POSTOP 4X8 (GAUZE/BANDAGES/DRESSINGS) IMPLANT
DRSG PAD ABDOMINAL 8X10 ST (GAUZE/BANDAGES/DRESSINGS) ×1 IMPLANT
ELECT BLADE 6.5 EXT (BLADE) ×1 IMPLANT
ELECT CAUTERY BLADE 6.4 (BLADE) ×4 IMPLANT
ELECT REM PT RETURN 9FT ADLT (ELECTROSURGICAL) ×2
ELECTRODE REM PT RTRN 9FT ADLT (ELECTROSURGICAL) ×2 IMPLANT
GLOVE BIO SURGEON STRL SZ7.5 (GLOVE) ×4 IMPLANT
GLOVE BIOGEL PI IND STRL 7.0 (GLOVE) IMPLANT
GLOVE BIOGEL PI IND STRL 8 (GLOVE) ×2 IMPLANT
GLOVE BIOGEL PI INDICATOR 7.0 (GLOVE) ×3
GLOVE BIOGEL PI INDICATOR 8 (GLOVE) ×2
GLOVE SURG SYN 7.5  E (GLOVE) ×2
GLOVE SURG SYN 7.5 E (GLOVE) ×1 IMPLANT
GLOVE SURG SYN 7.5 PF PI (GLOVE) ×2 IMPLANT
GOWN STRL REUS W/ TWL LRG LVL3 (GOWN DISPOSABLE) ×2 IMPLANT
GOWN STRL REUS W/ TWL XL LVL3 (GOWN DISPOSABLE) ×2 IMPLANT
GOWN STRL REUS W/TWL LRG LVL3 (GOWN DISPOSABLE) ×4
GOWN STRL REUS W/TWL XL LVL3 (GOWN DISPOSABLE) ×6
HANDLE SUCTION POOLE (INSTRUMENTS) ×2 IMPLANT
KIT BASIN OR (CUSTOM PROCEDURE TRAY) ×3 IMPLANT
KIT TURNOVER KIT B (KITS) ×3 IMPLANT
NS IRRIG 1000ML POUR BTL (IV SOLUTION) ×10 IMPLANT
PACK GENERAL/GYN (CUSTOM PROCEDURE TRAY) ×3 IMPLANT
PAD ARMBOARD 7.5X6 YLW CONV (MISCELLANEOUS) ×3 IMPLANT
PENCIL SMOKE EVACUATOR (MISCELLANEOUS) ×4 IMPLANT
RELOAD PROXIMATE 75MM BLUE (ENDOMECHANICALS) ×2 IMPLANT
RELOAD STAPLE 75 3.8 BLU REG (ENDOMECHANICALS) IMPLANT
SOL PREP PVP 2OZ (MISCELLANEOUS) ×2
SOLUTION PREP PVP 2OZ (MISCELLANEOUS) IMPLANT
SPECIMEN JAR LARGE (MISCELLANEOUS) IMPLANT
SPONGE T-LAP 18X18 ~~LOC~~+RFID (SPONGE) ×1 IMPLANT
STAPLER PROXIMATE 75MM BLUE (STAPLE) ×1 IMPLANT
STAPLER VISISTAT 35W (STAPLE) ×3 IMPLANT
SUCTION POOLE HANDLE (INSTRUMENTS) ×4
SUT PDS AB 1 TP1 54 (SUTURE) ×7 IMPLANT
SUT SILK 2 0 SH CR/8 (SUTURE) ×3 IMPLANT
SUT SILK 2 0 TIES 10X30 (SUTURE) ×3 IMPLANT
SUT SILK 3 0 SH CR/8 (SUTURE) ×3 IMPLANT
SUT SILK 3 0 TIES 10X30 (SUTURE) ×4 IMPLANT
TAPE CLOTH SURG 6X10 WHT LF (GAUZE/BANDAGES/DRESSINGS) ×1 IMPLANT
TOWEL GREEN STERILE (TOWEL DISPOSABLE) ×3 IMPLANT
TUBE CONNECTING 20X1/4 (TUBING) ×1 IMPLANT
YANKAUER SUCT BULB TIP NO VENT (SUCTIONS) ×1 IMPLANT

## 2022-01-13 NOTE — Anesthesia Preprocedure Evaluation (Signed)
Anesthesia Evaluation  Patient identified by MRN, date of birth, ID band Patient unresponsive    Reviewed: Allergy & Precautions, NPO status , Patient's Chart, lab work & pertinent test results, Unable to perform ROS - Chart review only  History of Anesthesia Complications (+) AWARENESS UNDER ANESTHESIA and history of anesthetic complications  Airway Mallampati: Unable to assess       Dental   Pulmonary asthma , pneumonia, unresolved, COPD,  COPD inhaler,  Currently intubated on ventilator   Pulmonary exam normal breath sounds clear to auscultation       Cardiovascular hypertension, Pt. on medications and Pt. on home beta blockers Normal cardiovascular exam+ dysrhythmias Atrial Fibrillation  Rhythm:Regular Rate:Normal  Takotsubo CM Hx/o Cardiac arrest 8/1 after admission for CAP   Neuro/Psych PSYCHIATRIC DISORDERS Anxiety negative neurological ROS     GI/Hepatic Neg liver ROS, GERD  Medicated,Ischemia colitis   Endo/Other  Hx/o Breast Ca S/P lumpectomy RT  Renal/GU Hx/o renal calculi  negative genitourinary   Musculoskeletal  (+) Arthritis ,   Abdominal   Peds  Hematology  (+) Blood dyscrasia, anemia , Chronic anticoagulation PT 37.3, INR 3.8   Anesthesia Other Findings   Reproductive/Obstetrics                             Anesthesia Physical Anesthesia Plan  ASA: 3  Anesthesia Plan: General   Post-op Pain Management: Dilaudid IV and Ofirmev IV (intra-op)*   Induction: Intravenous, Rapid sequence and Cricoid pressure planned  PONV Risk Score and Plan: 4 or greater and Treatment may vary due to age or medical condition, Ondansetron and Dexamethasone  Airway Management Planned: Oral ETT  Additional Equipment: None  Intra-op Plan:   Post-operative Plan: Extubation in OR and Post-operative intubation/ventilation  Informed Consent: I have reviewed the patients History and  Physical, chart, labs and discussed the procedure including the risks, benefits and alternatives for the proposed anesthesia with the patient or authorized representative who has indicated his/her understanding and acceptance.     Dental advisory given  Plan Discussed with: CRNA and Anesthesiologist  Anesthesia Plan Comments:         Anesthesia Quick Evaluation

## 2022-01-13 NOTE — Anesthesia Postprocedure Evaluation (Signed)
Anesthesia Post Note  Patient: [name]  Procedure(s) Performed: EXPLORATORY LAPAROTOMY, POSSIBLE ABDOMINAL ANASTOMOSIS, POSSIBLE OSTOMY, ABDOMINAL CLOSURE (Abdomen)     Patient location during evaluation: PACU Anesthesia Type: General Level of consciousness: patient remains intubated per anesthesia plan Pain management: pain level controlled Vital Signs Assessment: post-procedure vital signs reviewed and stable Respiratory status: spontaneous breathing, nonlabored ventilation and patient on ventilator - see flowsheet for VS Cardiovascular status: blood pressure returned to baseline and unstable Postop Assessment: no apparent nausea or vomiting Anesthetic complications: no   No notable events documented.  Last Vitals:  Vitals:   01/24/2022 1520 02/04/2022 1545  BP:    Pulse:    Resp:  14  Temp:    SpO2: 96%     Last Pain:  Vitals:   01/10/2022 1200  TempSrc: Core  PainSc:                  Peder Allums A.

## 2022-01-13 NOTE — Progress Notes (Addendum)
NAME:  [name], MRN:  [id number], DOB:  05-Feb-1941, LOS: 12 ADMISSION DATE:  01/03/2022, CONSULTATION DATE:  01/12/2022 REFERRING MD:  Bonner Puna, CHIEF COMPLAINT:  Hypotension, abdominal distention    History of Present Illness:  81 yo female admited for CAP causing respiratory failure, had a cardiac arrest after admission and was moved to the ICU.  Chest tube placed for parapneumonic effusion, extubated on 8/2. Sent out of ICU on 8/5. 8/6 developed ischemic colitis initially treated conservatively but this morning we were called back 8/7 for worsening shock, confusion.  She is going to the OR today.   Pertinent  Medical History  Breast ca on letrozole Afib on [facility] Events: Including procedures, antibiotic start and stop dates in addition to other pertinent events   7/28 admit, Ceftriaxone/Azithro 7/29 last dose xeralto  7/30 IHCA x 7 min, Ceftriaxone>Cefepime 7/31 Chest tube placed, heparin gtt started 8/1 patient remove chest tube.  Head CT obtained >negative.  Severely agitated overnight.  Vancomycin stopped.  Azithromycin stopped. 8/6 Transferred to [facility] [place] 8/7 Called CCM back for abd, Distention , hypotension and a fib with RVR, plan is for  urgent OR pending goals of care discussion then ICU post op if husband is in agreement.  Interim History / Subjective:  Ischemic bowel needed to be taken to OR 8/7. Out to ICU post op on vent and pressors. Open abdomen.   Objective   Blood pressure 119/63, pulse 63, temperature (!) 97.5 F (36.4 C), temperature source Core, resp. rate 17, height 5' (1.524 m), weight 77 kg, SpO2 100 %. PAP: (32-65)/(12-37) 47/19 CVP:  [9 mmHg] 9 mmHg CO:  [2.6 L/min] 2.6 L/min CI:  [1.5 L/min/m2] 1.5 L/min/m2  Vent Mode: PRVC FiO2 (%):  [40 %] 40 % Set Rate:  [16 bmp] 16 bmp Vt Set:  [360 mL] 360 mL PEEP:  [5 cmH20] 5 cmH20 Plateau Pressure:  [17 cmH20-20 cmH20] 20 cmH20   Intake/Output Summary (Last 24 hours) at 01/05/2022  0916 Last data filed at 01/17/2022 0800 Gross per 24 hour  Intake 1490.46 ml  Output 1205 ml  Net 285.46 ml    Filed Weights   01/24/2022 0400 01/12/22 0500 01/16/2022 0500  Weight: 69.5 kg 73.3 kg 77 kg    Examination:  General:  elderly female on vent. Deep sedation HENT: [place]/AT, PERRL, ETT Lungs: Diminished bases.  Cardiovascular: RRR, no MRG Abdomen:  Longitudinal surgical incision. Wound vac in place.  Extremities:  Cool. Mottled toes. No edema.  Neuro:  RASS -4 GU:  decreased UOP. Foley  Significant labs Na 132, K 3.9, Glucose 255, mag 1.8, albumin < 1.5 Triglyceride 176 Lactic 2.2 WBC 23.6, hgb 10.2 Co-ox 58  Resolved Hospital Problem list     Assessment & Plan:   Septic shock Ischemic colitis with perforation. Possible PNA ? Component of cardiogenic shock: co-ox on the low side. 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 Open abdomen, wound vac. Back to OR 8/9 if off pressors.   Acute hypoxic respiratory failure due to right sided CAP with parapneumonic effusion Currently on 2 L [place] with adequate sats COPD +/- acute exacerbation  - Full vent support - Pulmonary hygiene  - Neb triple therapy - Avoiding steroids  - Propofol infusion for RASS -4 with open abdomen - Add fentanyl infusion - Trend  triglycerides, borderline high. Adding fent to wean prop.   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 - IV heparin on hold perioperatively - Amiodarone drip - Appreciate cardiology assistance  - Will eventually need follow up Echo  Hypokalemia Hypomag - Trend BMET - Supp mag, K as indicated  Nutrition  - Will request PICC for TPN - NPO   Best Practice (right click and "Reselect all SmartList Selections" daily)   Diet/type:  NPO DVT prophylaxis: SCD GI prophylaxis: H2B Lines: Central line Swan Foley:  Foley Code Status:  full code Last date of multidisciplinary goals of care discussion [[place]  Labs   CBC: Recent Labs  Lab 01/08/22 0430 01/10/22 1006 01/10/2022 0041 01/29/2022 1102 01/16/2022 1305 01/12/22 0341 01/12/22 0454 01/12/22 0650 01/31/2022 0524  WBC 16.8* 33.2* 29.3*  --   --  23.6*  --   --  11.2*  NEUTROABS  --  30.9* 25.8*  --   --  21.0*  --   --  9.7*  HGB 11.1* 13.3 12.1   < > 11.6* 10.2* 10.5* 9.5* 6.9*  HCT 32.1* 39.2 37.0   < > 34.0* 30.5* 31.0* 28.0* 20.9*  MCV 105.2* 106.8* 110.1*  --   --  108.2*  --   --  107.7*  PLT 171 246 232  --   --  268  --   --  143*   < > = values in this interval not displayed.     Basic Metabolic Panel: Recent Labs  Lab 01/06/22 2205 01/07/22 0408 01/10/22 0046 01/10/2022 0041 01/10/2022 6578 01/12/2022 1102 02/01/2022 1252 02/03/2022 1305 02/02/2022 1409 01/12/22 0341 01/12/22 0454 01/12/22 0650 01/31/2022 0524  NA  --    < > 137 139 139   < >  --    < > 136 132* 133* 132* 132*  K  --    < > 4.6 4.6 5.2*   < >  --    < > 4.4 3.9 4.0 3.6 3.8  CL  --    < > 105 100 103  --   --   --  102 101  --   --  102  CO2  --    < > '26 28 27  '$ --   --   --  27 23  --   --  24  GLUCOSE  --    < > 134* 87 106*  --   --   --  208* 255*  --   --  86  BUN  --    < > '22 16 16  '$ --   --   --  17 20  --   --  19  CREATININE  --    < > 0.63 0.61 0.66  --   --   --  0.59 0.61  --   --  0.57  CALCIUM  --    < > 9.7 9.9 10.1  --   --   --  8.8* 8.4*  --   --  8.6*  MG  --    < > 2.0  --   --   --  1.7  --  1.7 1.8  --   --  1.9  PHOS 2.0*  --   --   --   --   --   --   --   --  3.6  --   --  3.1   < > =  values in this interval not displayed.    GFR: Estimated Creatinine Clearance: 53.2 mL/min (by C-G formula based on SCr of 0.57 mg/dL). Recent Labs  Lab 01/10/22 1006 01/10/22 1459 01/10/22 1800 01/07/2022 0041 02/01/2022 0608 01/17/2022 1252 01/12/22 0341 01/24/2022 0524   PROCALCITON  --   --   --   --   --  0.38  --   --   WBC 33.2*  --   --  29.3*  --   --  23.6* 11.2*  LATICACIDVEN  --    < > 1.6  --  2.6* 1.2 2.2*  --    < > = values in this interval not displayed.     Liver Function Tests: Recent Labs  Lab 01/10/2022 0041 01/30/2022 1409 01/12/22 0341 01/12/2022 0524  AST 55* 38 32 24  ALT 88* 59* 47* 27  ALKPHOS 130* 101 86 60  BILITOT 2.4* 2.4* 1.3* 1.2  PROT 5.7* 4.5* 4.7* 4.5*  ALBUMIN 1.9* <1.5* <1.5* 2.1*    No results for input(s): "LIPASE", "AMYLASE" in the last 168 hours. No results for input(s): "AMMONIA" in the last 168 hours.  ABG    Component Value Date/Time   PHART 7.410 01/12/2022 0454   PCO2ART 41.3 01/12/2022 0454   PO2ART 173 (H) 01/12/2022 0454   HCO3 23.6 01/12/2022 0650   TCO2 25 01/12/2022 0650   ACIDBASEDEF 2.0 01/12/2022 0650   O2SAT 70.7 01/12/2022 0953     Coagulation Profile: No results for input(s): "INR", "PROTIME" in the last 168 hours.  Cardiac Enzymes: No results for input(s): "CKTOTAL", "CKMB", "CKMBINDEX", "TROPONINI" in the last 168 hours.  HbA1C: Hgb A1c MFr Bld  Date/Time Value Ref Range Status  01/03/2022 09:18 PM 4.9 4.8 - 5.6 % Final    Comment:    (NOTE) Pre diabetes:          5.7%-6.4%  Diabetes:              >6.4%  Glycemic control for   <7.0% adults with diabetes     CBG: Recent Labs  Lab 01/12/22 2002 01/12/22 2009 01/23/2022 0026 01/20/2022 0528 01/07/2022 0743  GLUCAP 59* 94 92 90 74     Critical care time: 50 mins     Georgann Housekeeper, AGACNP-BC [facility]  See Amion for personal pager PCCM on call pager [phone] until 7pm. Please call Elink 7p-7a. [phone]  01/19/2022 9:16 AM  ATTENDING ADDENDUM Seen in f/u for shock, respiratory failure in context of ischemic bowel.  Pressor needs improved markedly.  Remains sedated on vent, abd soft, hypoactive BS  Diffuse anasarca  BMP looks good H/H down, getting blood  To return to  OR today Continue vent support, pressors, abx as ordered in interim.  Hold heparin, continue TPN  My cc time: 31 mins Erskine Emery MD PCCM

## 2022-01-13 NOTE — Progress Notes (Signed)
PHARMACY - TOTAL PARENTERAL NUTRITION CONSULT NOTE   Indication: bowel perf s/p repair with open abdomen, malnourished and npo   Patient Measurements: Height: 5' (152.4 cm) Weight: 77 kg (169 lb 12.1 oz) IBW/kg (Calculated) : 45.5 TPN AdjBW (KG): 51 Body mass index is 33.15 kg/m.  Assessment: 81 yo female admited for CAP causing respiratory failure, had a cardiac arrest after admission and was moved to the ICU.  Chest tube placed for parapneumonic effusion, extubated on 8/2. Sent out of ICU on 8/5. 8/6 developed ischemic colitis initially treated conservatively but this morning we were called back 8/7 for worsening shock, confusion.  POD #1 s/p ex lap, R colectomy for ischemic bowel with perforation. Pharmacy consulted to start TPN.  Glucose / Insulin: 4 units of SSI on sensitive SSI Electrolytes: Na 132, K 3.8, Cl 102, CO2 24, Mag 1.9, Phos 3.1 Renal: BUN 19, Scr 0.57 Hepatic: Albumin 2.1, TG 116; patient on propofol will monitor Tgs closely Intake / Output; MIVF: 0.3 ml/kg/hr, drains 550 mls, 250 mls NG output GI Imaging: GI Surgeries / Procedures:  8/7 Ex lap, colon resection 8/9 back to OR today for possible wound closure  Central access: PICC ordered for 8/3 TPN start date: Plan to start 8/9  Nutritional Goals: Goal TPN rate is 63 mL/hr (provides 85 g of protein and 1520 kcals per day)  RD Assessment: Estimated Needs Total Energy Estimated Needs: 1500-1700 kcals Total Protein Estimated Needs: 85-100 g Total Fluid Estimated Needs: >/= 1.5 L  Current Nutrition:  NPO  Plan:  Start TPN on 8/9 tat a rate of 30 ml/hr which will provide about 50% of needs Electrolytes in TPN: Na 50 mEq/L, K 50 mEq/L, Ca 5 mEq/L, Mg 5 mEq/L, Phos 5 mmol/L, Max acetate  Add standard MVI and trace elements to TPN  Continue sensitive SSI frequency q4hrs Will Monitor TPN labs on Mon/Thurs, and daily during initial titration  Alanda Slim, PharmD, [place] Clinical Pharmacist Please see AMION for  all Pharmacists' Contact Phone Numbers 01/12/2022, 9:42 AM

## 2022-01-13 NOTE — Progress Notes (Signed)
Progress Note  Patient Name: [name] Date of Encounter: 01/22/2022  [facility] Cardiologist: Kirk Ruths, MD   Subjective   Remains intubated and sedated  Inpatient Medications    Scheduled Meds:  sodium chloride   Intravenous Once   arformoterol  15 mcg Nebulization BID   budesonide (PULMICORT) nebulizer solution  0.25 mg Nebulization BID   dextrose  50 mL Intravenous Once   fentaNYL (SUBLIMAZE) injection  25 mcg Intravenous Once   insulin aspart  0-9 Units Subcutaneous Q4H   mouth rinse  15 mL Mouth Rinse Q2H   pantoprazole (PROTONIX) IV  40 mg Intravenous Q24H   revefenacin  175 mcg Nebulization Daily   sodium chloride flush  10-40 mL Intracatheter Q12H   Continuous Infusions:  sodium chloride Stopped (01/16/2022 1404)   amiodarone Stopped (01/12/22 1427)   famotidine (PEPCID) IV 20 mg (01/10/2022 0019)   fentaNYL infusion INTRAVENOUS 125 mcg/hr (01/17/2022 4132)   magnesium sulfate bolus IVPB 2 g (01/14/2022 0732)   micafungin (MYCAMINE) 100 mg in sodium chloride 0.9 % 100 mL IVPB Stopped (01/12/22 1607)   norepinephrine (LEVOPHED) Adult infusion 2 mcg/min (01/28/2022 0740)   piperacillin-tazobactam (ZOSYN)  IV 3.375 g (01/31/2022 0536)   propofol (DIPRIVAN) infusion 30 mcg/kg/min (01/08/2022 0656)   vasopressin 0.01 Units/min (01/20/2022 0611)   PRN Meds: sodium chloride, acetaminophen, fentaNYL, levalbuterol, ondansetron (ZOFRAN) IV, mouth rinse, polyethylene glycol, sodium chloride flush   Vital Signs    Vitals:   01/24/2022 0600 01/05/2022 0615 01/20/2022 0630 02/02/2022 0700  BP: (!) 126/109  99/64 (!) 99/57  Pulse: 79 91 91 93  Resp: '16 12 14 12  '$ Temp: 98.2 F (36.8 C) 98.2 F (36.8 C) 98.1 F (36.7 C) 98.1 F (36.7 C)  TempSrc:      SpO2: 100% 100% 100% 100%  Weight:      Height:        Intake/Output Summary (Last 24 hours) at 02/02/2022 0747 Last data filed at 01/07/2022 0640 Gross per 24 hour  Intake 1540.4 ml  Output 1275 ml  Net 265.4 ml        01/09/2022    5:00 AM 01/12/2022    5:00 AM 01/17/2022    4:00 AM  Last 3 Weights  Weight (lbs) 169 lb 12.1 oz 161 lb 9.6 oz 153 lb 3.5 oz  Weight (kg) 77 kg 73.3 kg 69.5 kg      Telemetry    Atrial fibrillation rate controlled- Personally Reviewed   Physical Exam   GEN: Intubated and sedated Neck: no JVD Cardiac: irregular, no gallop Respiratory: CTA anteriorly; no rhonchi GI: s/p abd surgery MS: trace edema Neuro:  Intubated and sedated Psych: Intubated and sedated  Labs    High Sensitivity Troponin:   Recent Labs  Lab 01/02/22 1023 01/02/22 1308 01/03/22 2119 01/04/22 0513 01/05/22 0502  TROPONINIHS 8 11 123* 131* 55*      Chemistry Recent Labs  Lab 01/10/2022 1409 01/12/22 0341 01/12/22 0454 01/12/22 0650 01/26/2022 0524  NA 136 132* 133* 132* 132*  K 4.4 3.9 4.0 3.6 3.8  CL 102 101  --   --  102  CO2 27 23  --   --  24  GLUCOSE 208* 255*  --   --  86  BUN 17 20  --   --  19  CREATININE 0.59 0.61  --   --  0.57  CALCIUM 8.8* 8.4*  --   --  8.6*  MG 1.7 1.8  --   --  1.9  PROT 4.5* 4.7*  --   --  4.5*  ALBUMIN <1.5* <1.5*  --   --  2.1*  AST 38 32  --   --  24  ALT 59* 47*  --   --  27  ALKPHOS 101 86  --   --  60  BILITOT 2.4* 1.3*  --   --  1.2  GFRNONAA >60 >60  --   --  >60  ANIONGAP 7 8  --   --  6      Hematology Recent Labs  Lab 01/28/2022 0041 01/24/2022 1102 01/12/22 0341 01/12/22 0454 01/12/22 0650 01/23/2022 0524  WBC 29.3*  --  23.6*  --   --  11.2*  RBC 3.36*  --  2.82*  --   --  1.94*  HGB 12.1   < > 10.2* 10.5* 9.5* 6.9*  HCT 37.0   < > 30.5* 31.0* 28.0* 20.9*  MCV 110.1*  --  108.2*  --   --  107.7*  MCH 36.0*  --  36.2*  --   --  35.6*  MCHC 32.7  --  33.4  --   --  33.0  RDW 13.5  --  13.2  --   --  13.0  PLT 232  --  268  --   --  143*   < > = values in this interval not displayed.      Radiology    DG Chest Port 1 View  Result Date: 01/12/2022 CLINICAL DATA:  PICC line placement.  Endotracheal tube placement. EXAM:  PORTABLE CHEST 1 VIEW COMPARISON:  Same day at 5:14 a.m. FINDINGS: Endotracheal tube has tip 2.6 cm above the carina. Right IJ Swan-Ganz catheter is present with tip over the main pulmonary artery segment. Interval placement of left-sided PICC line with tip at the cavoatrial junction. Nasogastric tube looped once in the stomach with tip in the right upper quadrant unchanged. Lungs are adequately inflated with moderate opacification over the right mid to lower lung likely effusion with associated atelectasis. Chest of a small amount left pleural fluid with basilar atelectasis. Infection over the lung bases is possible. Cardiomediastinal silhouette and remainder of the exam is unchanged. IMPRESSION: 1. Persistent opacification over the right mid to lower lung likely effusion with associated atelectasis. Small amount left pleural fluid with basilar atelectasis. Infection over the lung bases is possible. 2. Tubes and lines as described. Left PICC line with tip at the cavoatrial junction. Electronically Signed   By: Marin Olp M.D.   On: 01/12/2022 12:53   [place] EKG SITE RITE  Result Date: 01/12/2022 If Site Rite image not attached, placement could not be confirmed due to current cardiac rhythm.  DG CHEST PORT 1 VIEW  Result Date: 01/12/2022 CLINICAL DATA:  Endotracheal tube placement, recent abdominal surgery, respiratory distress EXAM: PORTABLE CHEST 1 VIEW COMPARISON:  01/24/2022 FINDINGS: Endotracheal tube is 1.5 cm above the carina. Right IJ Swan-Ganz catheter tip overlies the main pulmonary artery. Enteric tube passes into the stomach. Persistent elevation of the right hemidiaphragm. Probable right larger than left pleural effusions with bibasilar atelectasis/consolidation. No pneumothorax. Similar cardiomediastinal contours. IMPRESSION: Lines and tubes as above. Probable right larger than left pleural effusions with bibasilar atelectasis/consolidation. Electronically Signed   By: Macy Mis M.D.   On:  01/12/2022 08:05   DG CHEST PORT 1 VIEW  Result Date: 01/26/2022 CLINICAL DATA:  Respiratory failure.  Endotracheal tube placement. EXAM: PORTABLE CHEST 1 VIEW COMPARISON:  January 11, 2022. FINDINGS: Endotracheal tube is noted in grossly good position. Nasogastric tube is seen with tip in proximal stomach. Right internal jugular Swan-Ganz catheter is now noted with tip in expected position of main pulmonary artery. Stable right basilar atelectasis or infiltrate is noted with associated effusion. Small left pleural effusion is noted. Bony thorax is unremarkable. IMPRESSION: Endotracheal and nasogastric tubes are in grossly good position. Right internal jugular Swan-Ganz catheter is noted with tip in expected position of main pulmonary artery. Electronically Signed   By: Marijo Conception M.D.   On: 01/17/2022 13:10     Patient Profile     81 y.o. female with a hx of atrial fib on propranolol 80 mg qd and Xarelto, HTN, asthma, breast CA, anxiety, who is being seen 01/02/2022 for the evaluation of CHF & rapid atrial fib at the request of Dr Marthenia Rolling.  Patient was admitted July 28 with probable pneumonia. Patient suffered a PEA arrest on July 30 requiring 7 minutes of CPR.  Patient ultimately required chest tube placement for parapneumonic effusion.  Echocardiogram this admission shows ejection fraction 25 to 30%.  The mid/apical segments are akinetic and findings felt consistent with Takotsubo cardiomyopathy.  There was mild left atrial enlargement, moderate right atrial enlargement, moderate mitral regurgitation, mild to moderate tricuspid regurgitation, mild to moderate aortic insufficiency.  Taken to the OR August 7 and found to have ischemic portion of her cecum with contained perforation; had resection of right colon with ileostomy.  Assessment & Plan    1 permanent atrial fibrillation-heart rate remains controlled on amiodarone.  Will continue.  Resume anticoagulation once okay from a surgical standpoint.     2 Takotsubo cardiomyopathy-patient is presently on vasopressin and low-dose norepinephrine.  We will consider addition of guideline directed medical therapy later as blood pressure improves.  She will need follow-up echocardiogram as well.    3 status post resection of ischemic portion of cecum with contained perforation-she is scheduled for additional surgery later today.  4 pneumonia/parapneumonic effusion-Per primary service.  Continue antibiotics.  5 valvular heart disease including moderate mitral regurgitation, mild to moderate aortic insufficiency-patient will need follow-up echoes in the future.  6 status post PEA arrest-this was felt secondary to respiratory distress.  For questions or updates, please contact [place] Please consult [email or web address] for contact info under        Signed, Kirk Ruths, MD  01/28/2022, 7:47 AM

## 2022-01-13 NOTE — Op Note (Signed)
01/12/2022  2:51 PM  PATIENT:  [name]  81 y.o. female  PRE-OPERATIVE DIAGNOSIS:  COLONIC ISCHEMIA  POST-OPERATIVE DIAGNOSIS:  COLONIC ISCHEMIA  PROCEDURE:  Procedure(s): EXPLORATORY LAPAROTOMY,INTESTINAL ANASTOMOSIS, ABDOMINAL CLOSURE (N/A)  SURGEON:  Surgeon(s) and Role:    * Ralene Ok, MD - Primary  PHYSICIAN ASSISTANT: Alferd Apa, PA-C ANESTHESIA:   local and general  EBL:  minimal   BLOOD ADMINISTERED:none  DRAINS: none   LOCAL MEDICATIONS USED:  NONE  SPECIMEN:  No Specimen  DISPOSITION OF SPECIMEN:  N/A  COUNTS:  YES  TOURNIQUET:  * No tourniquets in log *  DICTATION: .Dragon Dictation Indication: Pt was left with open abdomen from last surgery s/p cecal perforation.  Pt did well and was weaned off pressors.  She was taken back to the OR for a 2nd look and possible anastamosis.  Findings: Pt had a normal perfused SB and colon.  No abscesses were seen.  A stapled ileocolonic anastamosis was performed.  Details of the procedure:  After the patient was seen she was taken back to the OR and placed in the supine position.  Genereal anesthesia was started.   The patient was prepped and draped in the usual fashion. A time out was called and all facts were verified.   The abdominal vac was removed.  There was no significant fluid in the abdomen.  The small bowel was eviscerated.  The stapled end of the small bowel and colon were identified.   The small bowel was perfused well.    I proceeded to remove the corner of each staple line.  A 75 GIA staple was used to create the anastamosis.  A 3-0 silk was used and placed at the apex of the anastamosis.  The anastamosis was patent.  The abdomen was irrigated out thoroughly.   #1 PDS suture was used x2 to close the abdomen in a standard fashion.  Pt tolerated the procedure well.  She was taken back to the ICU in critical condition and intubated.    PLAN OF CARE: Admit to inpatient   PATIENT DISPOSITION:   ICU - intubated and critically ill.   Delay start of Pharmacological VTE agent (>24hrs) due to surgical blood loss or risk of bleeding: yes

## 2022-01-13 NOTE — Progress Notes (Signed)
OT Cancellation Note  Patient Details Name: [name] MRN: [id number] DOB: 09-13-1940   Cancelled Treatment:    Reason Eval/Treat Not Completed: Medical issues which prohibited therapy.  Patient with decline in medical status, scheduled for OR this date.  OT will await new orders.    James Senn D [name] 01/17/2022, 11:04 AM 01/26/2022  RP, OTR/L  Acute Rehabilitation Services  Office:  [phone]

## 2022-01-13 NOTE — Progress Notes (Signed)
[facility] Progress Note Patient Name: [name] DOB: [birth year] MRN: [id number]   Date of Service  01/06/2022  HPI/Events of Note  Hemoglobin 6.9 gm / dl, there is approximately 150 ml output from a wound vac but no other source of bleeding.  eICU Interventions  Transfuse 1 unit of PRBC, Trend H & H.        Kerry Kass Evelean Bigler 01/17/2022, 6:34 AM

## 2022-01-13 NOTE — Transfer of Care (Signed)
Immediate Anesthesia Transfer of Care Note  Patient: [name]  Procedure(s) Performed: EXPLORATORY LAPAROTOMY, POSSIBLE ABDOMINAL ANASTOMOSIS, POSSIBLE OSTOMY, ABDOMINAL CLOSURE (Abdomen)  Patient Location: SICU  Anesthesia Type:General  Level of Consciousness: drowsy and patient cooperative  Airway & Oxygen Therapy: Patient remains intubated per anesthesia plan and Patient placed on Ventilator (see vital sign flow sheet for setting)  Post-op Assessment: Report given to RN and Post -op Vital signs reviewed and stable  Post vital signs: Reviewed and stable  Last Vitals:  Vitals Value Taken Time  BP    Temp    Pulse    Resp    SpO2 96 % 01/08/2022 1520    Last Pain:  Vitals:   01/08/2022 1200  TempSrc: Core  PainSc:       Patients Stated Pain Goal: 0 (88/75/79 7282)  Complications: No notable events documented.

## 2022-01-13 NOTE — TOC Progression Note (Signed)
Transition of Care [facility]) - Progression Note    Patient Details  Name: [name] MRN: [id number] Date of Birth: 1941-05-06  Transition of Care [facility]) CM/SW [place], [place] Phone Number: 01/10/2022, 11:48 AM  Clinical Narrative:     TOC continues to follow patient. Patient on vent. CSW will continue to follow and assist with patients dc planning needs.  Expected Discharge Plan: [place] Barriers to Discharge: Continued Medical Work up  Expected Discharge Plan and Services Expected Discharge Plan: [place] In-house Referral: Clinical Social Work Discharge Planning Services: CM Consult Post Acute Care Choice: [place] arrangements for the past 2 months: Single Family Home                                       Social Determinants of Health (SDOH) Interventions    Readmission Risk Interventions     No data to display

## 2022-01-14 ENCOUNTER — Encounter (HOSPITAL_COMMUNITY): Payer: Self-pay | Admitting: General Surgery

## 2022-01-14 DIAGNOSIS — J189 Pneumonia, unspecified organism: Secondary | ICD-10-CM | POA: Diagnosis not present

## 2022-01-14 LAB — COMPREHENSIVE METABOLIC PANEL
ALT: 27 U/L (ref 0–44)
AST: 33 U/L (ref 15–41)
Albumin: 2.3 g/dL — ABNORMAL LOW (ref 3.5–5.0)
Alkaline Phosphatase: 80 U/L (ref 38–126)
Anion gap: 8 (ref 5–15)
BUN: 18 mg/dL (ref 8–23)
CO2: 24 mmol/L (ref 22–32)
Calcium: 8.9 mg/dL (ref 8.9–10.3)
Chloride: 102 mmol/L (ref 98–111)
Creatinine, Ser: 0.7 mg/dL (ref 0.44–1.00)
GFR, Estimated: >60 mL/min (ref >60)
Glucose, Bld: 160 mg/dL — ABNORMAL HIGH (ref 70–99)
Potassium: 3.7 mmol/L (ref 3.5–5.1)
Sodium: 134 mmol/L — ABNORMAL LOW (ref 135–145)
Total Bilirubin: 1.2 mg/dL (ref 0.3–1.2)
Total Protein: 5.3 g/dL — ABNORMAL LOW (ref 6.5–8.1)

## 2022-01-14 LAB — CBC WITH DIFFERENTIAL/PLATELET
Abs Immature Granulocytes: 0.6 10*3/uL — ABNORMAL HIGH (ref 0.00–0.07)
Basophils Absolute: 0.1 10*3/uL (ref 0.0–0.1)
Basophils Relative: 0 %
Eosinophils Absolute: 0 10*3/uL (ref 0.0–0.5)
Eosinophils Relative: 0 %
HCT: 32.4 % — ABNORMAL LOW (ref 36.0–46.0)
Hemoglobin: 11 g/dL — ABNORMAL LOW (ref 12.0–15.0)
Immature Granulocytes: 3 %
Lymphocytes Relative: 4 %
Lymphs Abs: 0.8 10*3/uL (ref 0.7–4.0)
MCH: 34.8 pg — ABNORMAL HIGH (ref 26.0–34.0)
MCHC: 34 g/dL (ref 30.0–36.0)
MCV: 102.5 fL — ABNORMAL HIGH (ref 80.0–100.0)
Monocytes Absolute: 0.7 10*3/uL (ref 0.1–1.0)
Monocytes Relative: 4 %
Neutro Abs: 16.3 10*3/uL — ABNORMAL HIGH (ref 1.7–7.7)
Neutrophils Relative %: 89 %
Platelets: 220 10*3/uL (ref 150–400)
RBC: 3.16 MIL/uL — ABNORMAL LOW (ref 3.87–5.11)
RDW: 18 % — ABNORMAL HIGH (ref 11.5–15.5)
WBC: 18.5 10*3/uL — ABNORMAL HIGH (ref 4.0–10.5)
nRBC: 0 % (ref 0.0–0.2)

## 2022-01-14 LAB — BASIC METABOLIC PANEL
Anion gap: 6 (ref 5–15)
BUN: 17 mg/dL (ref 8–23)
CO2: 29 mmol/L (ref 22–32)
Calcium: 9.4 mg/dL (ref 8.9–10.3)
Chloride: 103 mmol/L (ref 98–111)
Creatinine, Ser: 0.71 mg/dL (ref 0.44–1.00)
GFR, Estimated: >60 mL/min (ref >60)
Glucose, Bld: 144 mg/dL — ABNORMAL HIGH (ref 70–99)
Potassium: 3.4 mmol/L — ABNORMAL LOW (ref 3.5–5.1)
Sodium: 138 mmol/L (ref 135–145)

## 2022-01-14 LAB — GLUCOSE, CAPILLARY
Glucose-Capillary: 105 mg/dL — ABNORMAL HIGH (ref 70–99)
Glucose-Capillary: 110 mg/dL — ABNORMAL HIGH (ref 70–99)
Glucose-Capillary: 115 mg/dL — ABNORMAL HIGH (ref 70–99)
Glucose-Capillary: 119 mg/dL — ABNORMAL HIGH (ref 70–99)
Glucose-Capillary: 127 mg/dL — ABNORMAL HIGH (ref 70–99)
Glucose-Capillary: 135 mg/dL — ABNORMAL HIGH (ref 70–99)
Glucose-Capillary: 154 mg/dL — ABNORMAL HIGH (ref 70–99)
Glucose-Capillary: 32 mg/dL — CL (ref 70–99)

## 2022-01-14 LAB — CBC
HCT: 30.2 % — ABNORMAL LOW (ref 36.0–46.0)
Hemoglobin: 10.1 g/dL — ABNORMAL LOW (ref 12.0–15.0)
MCH: 35.2 pg — ABNORMAL HIGH (ref 26.0–34.0)
MCHC: 33.4 g/dL (ref 30.0–36.0)
MCV: 105.2 fL — ABNORMAL HIGH (ref 80.0–100.0)
Platelets: 228 10*3/uL (ref 150–400)
RBC: 2.87 MIL/uL — ABNORMAL LOW (ref 3.87–5.11)
RDW: 17.9 % — ABNORMAL HIGH (ref 11.5–15.5)
WBC: 18.3 10*3/uL — ABNORMAL HIGH (ref 4.0–10.5)
nRBC: 0.1 % (ref 0.0–0.2)

## 2022-01-14 LAB — PHOSPHORUS: Phosphorus: 3.3 mg/dL (ref 2.5–4.6)

## 2022-01-14 LAB — TRIGLYCERIDES: Triglycerides: 127 mg/dL (ref <150)

## 2022-01-14 LAB — MAGNESIUM: Magnesium: 2.1 mg/dL (ref 1.7–2.4)

## 2022-01-14 LAB — APTT: aPTT: 26 seconds (ref 24–36)

## 2022-01-14 MED ORDER — FUROSEMIDE 10 MG/ML IJ SOLN
80.0000 mg | Freq: Four times a day (QID) | INTRAMUSCULAR | Status: AC
  Administered 2022-01-14 – 2022-01-15 (×3): 80 mg via INTRAVENOUS
  Filled 2022-01-14 (×3): qty 8

## 2022-01-14 MED ORDER — ALBUMIN HUMAN 25 % IV SOLN
25.0000 g | Freq: Four times a day (QID) | INTRAVENOUS | Status: AC
  Administered 2022-01-14 – 2022-01-15 (×4): 25 g via INTRAVENOUS
  Filled 2022-01-14 (×4): qty 100

## 2022-01-14 MED ORDER — FUROSEMIDE 10 MG/ML IJ SOLN: 40.0000 mg | Freq: Once | INTRAMUSCULAR | Status: DC

## 2022-01-14 MED ORDER — HALOPERIDOL LACTATE 5 MG/ML IJ SOLN
2.0000 mg | Freq: Four times a day (QID) | INTRAMUSCULAR | Status: DC | PRN
Start: 2022-01-14 — End: 2022-02-11
  Administered 2022-01-15 – 2022-01-16 (×2): 2 mg via INTRAVENOUS
  Filled 2022-01-14 (×2): qty 1

## 2022-01-14 MED ORDER — POTASSIUM CHLORIDE 10 MEQ/50ML IV SOLN
10.0000 meq | INTRAVENOUS | Status: AC
  Administered 2022-01-14 – 2022-01-15 (×6): 10 meq via INTRAVENOUS
  Filled 2022-01-14 (×6): qty 50

## 2022-01-14 MED ORDER — POTASSIUM CHLORIDE 20 MEQ PO PACK
60.0000 meq | PACK | Freq: Once | ORAL | Status: AC
  Administered 2022-01-14: 60 meq
  Filled 2022-01-14: qty 3

## 2022-01-14 MED ORDER — OXYCODONE HCL 5 MG PO TABS
5.0000 mg | ORAL_TABLET | ORAL | Status: DC | PRN
  Administered 2022-01-15 – 2022-01-16 (×2): 5 mg via ORAL
  Filled 2022-01-14 (×2): qty 1

## 2022-01-14 MED ORDER — METOPROLOL TARTRATE 5 MG/5ML IV SOLN
5.0000 mg | Freq: Four times a day (QID) | INTRAVENOUS | Status: DC | PRN
  Administered 2022-01-16: 5 mg via INTRAVENOUS
  Filled 2022-01-14 (×2): qty 5

## 2022-01-14 MED ORDER — TRAVASOL 10 % IV SOLN
INTRAVENOUS | Status: AC
  Filled 2022-01-14: qty 861.8

## 2022-01-14 NOTE — Progress Notes (Addendum)
[facility] Progress Note  1 Day Post-Op  Subjective: CC:  Intubated, sedated  8 mcg norepi, off vaso   Objective: Vital signs in last 24 hours: Temp:  [95.2 F (35.1 C)-100 F (37.8 C)] 98.1 F (36.7 C) (08/10 0628) Pulse Rate:  [63-110] 94 (08/10 0628) Resp:  [12-21] 17 (08/10 0628) BP: (97-132)/(53-94) 111/71 (08/10 0500) SpO2:  [96 %-100 %] 100 % (08/10 0628) Arterial Line BP: (91-137)/(40-68) 95/64 (08/10 0628) FiO2 (%):  [40 %] 40 % (08/10 0337) Weight:  [73.1 kg] 73.1 kg (08/10 0500) Last BM Date : 01/06/22  Intake/Output from previous day: 08/09 0701 - 08/10 0700 In: 2217.9 [I.V.:1613.3; Blood:315; IV Piggyback:289.6] Out: 2725 [Urine:885; Drains:350; Blood:10] Intake/Output this shift: No intake/output data recorded.  PE: Gen:  intubated, sedated, on vent, appears ill Card:  Regular rate and rhythm, some edema of extremities Pulm:  ventilated respirations  Abd: Soft, midline dressing c/d/I GU: clear yellow urine, 885 cc/24h Skin: warm and dry, no rashes  Psych: unable to assess   Lab Results:  Recent Labs    01/05/2022 0524 01/24/2022 1101  WBC 11.2* 11.5*  HGB 6.9* 8.4*  HCT 20.9* 25.1*  PLT 143* 159   BMET Recent Labs    01/12/22 0341 01/12/22 0454 01/12/22 0650 01/06/2022 0524  NA 132*   < > 132* 132*  K 3.9   < > 3.6 3.8  CL 101  --   --  102  CO2 23  --   --  24  GLUCOSE 255*  --   --  86  BUN 20  --   --  19  CREATININE 0.61  --   --  0.57  CALCIUM 8.4*  --   --  8.6*   < > = values in this interval not displayed.   PT/INR No results for input(s): "LABPROT", "INR" in the last 72 hours. CMP     Component Value Date/Time   NA 132 (L) 01/19/2022 0524   NA 139 11/18/2021 1432   NA 134 (L) 04/25/2017 1216   K 3.8 01/22/2022 0524   K 4.3 04/25/2017 1216   CL 102 01/10/2022 0524   CO2 24 01/17/2022 0524   CO2 29 04/25/2017 1216   GLUCOSE 86 01/29/2022 0524   GLUCOSE 85 04/25/2017 1216   BUN 19 01/12/2022 0524   BUN 10  11/18/2021 1432   BUN 14.4 04/25/2017 1216   CREATININE 0.57 02/04/2022 0524   CREATININE 0.72 01/17/2018 1347   CREATININE 0.7 04/25/2017 1216   CALCIUM 8.6 (L) 01/21/2022 0524   CALCIUM 10.0 04/25/2017 1216   PROT 4.5 (L) 01/10/2022 0524   PROT 7.2 04/25/2017 1216   ALBUMIN 2.1 (L) 01/10/2022 0524   ALBUMIN 3.7 04/25/2017 1216   AST 24 02/01/2022 0524   AST 20 01/17/2018 1347   AST 21 04/25/2017 1216   ALT 27 01/10/2022 0524   ALT 10 01/17/2018 1347   ALT 10 04/25/2017 1216   ALKPHOS 60 02/04/2022 0524   ALKPHOS 75 04/25/2017 1216   BILITOT 1.2 01/29/2022 0524   BILITOT 0.5 01/17/2018 1347   BILITOT 0.65 04/25/2017 1216   GFRNONAA >60 02/03/2022 0524   GFRNONAA >60 01/17/2018 1347   GFRAA 90 04/24/2020 1235   GFRAA >60 01/17/2018 1347   Lipase     Component Value Date/Time   LIPASE 77 07/28/2008 1630       Studies/Results: DG Chest Port 1 View  Result Date: 02/03/2022 CLINICAL DATA:  Endotracheal tube and site  2.  Respiratory distress. EXAM: PORTABLE CHEST 1 VIEW COMPARISON:  One-view chest x-ray 01/12/2022 FINDINGS: Endotracheal tube terminates 1.5 cm above the carina and could be pulled back 1-2 cm for more optimal positioning. A Swan-Ganz catheter terminates at the pulmonary outflow tract. Enteric tube terminates in the stomach. A left-sided PICC line is in place. The tip is in the right atrium. Right greater left pleural effusion remains. Bibasilar airspace disease is noted. IMPRESSION: 1. Endotracheal tube terminates 1.5 cm above the carina and could be pulled back 1-2 cm for more optimal positioning. 2. Swan-Ganz catheter terminates at the pulmonary outflow tract. 3. New left-sided PICC line terminates in the right atrium. 4. Enteric tube terminates in the stomach. 5. Right greater than left pleural effusions and bibasilar airspace opacities. This likely reflects atelectasis. Infection is not excluded. Electronically Signed   By: San Morelle M.D.   On:  01/29/2022 08:33   DG Chest Port 1 View  Result Date: 01/12/2022 CLINICAL DATA:  PICC line placement.  Endotracheal tube placement. EXAM: PORTABLE CHEST 1 VIEW COMPARISON:  Same day at 5:14 a.m. FINDINGS: Endotracheal tube has tip 2.6 cm above the carina. Right IJ Swan-Ganz catheter is present with tip over the main pulmonary artery segment. Interval placement of left-sided PICC line with tip at the cavoatrial junction. Nasogastric tube looped once in the stomach with tip in the right upper quadrant unchanged. Lungs are adequately inflated with moderate opacification over the right mid to lower lung likely effusion with associated atelectasis. Chest of a small amount left pleural fluid with basilar atelectasis. Infection over the lung bases is possible. Cardiomediastinal silhouette and remainder of the exam is unchanged. IMPRESSION: 1. Persistent opacification over the right mid to lower lung likely effusion with associated atelectasis. Small amount left pleural fluid with basilar atelectasis. Infection over the lung bases is possible. 2. Tubes and lines as described. Left PICC line with tip at the cavoatrial junction. Electronically Signed   By: Marin Olp M.D.   On: 01/12/2022 12:53   [place] EKG SITE RITE  Result Date: 01/12/2022 If Site Rite image not attached, placement could not be confirmed due to current cardiac rhythm.   Anti-infectives: Anti-infectives (From admission, onward)    Start     Dose/Rate Route Frequency Ordered Stop   01/20/2022 1400  micafungin (MYCAMINE) 100 mg in sodium chloride 0.9 % 100 mL IVPB        100 mg 105 mL/hr over 1 Hours Intravenous Every 24 hours 01/10/2022 1302     01/10/22 1515  piperacillin-tazobactam (ZOSYN) IVPB 3.375 g        3.375 g 12.5 mL/hr over 240 Minutes Intravenous Every 8 hours 01/10/22 1427     01/04/22 2000  vancomycin (VANCOCIN) IVPB 1000 mg/200 mL premix  Status:  Discontinued        1,000 mg 200 mL/hr over 60 Minutes Intravenous Every 24 hours  01/03/22 1910 01/05/22 1321   01/03/22 2200  ceFEPIme (MAXIPIME) 2 g in sodium chloride 0.9 % 100 mL IVPB        2 g 200 mL/hr over 30 Minutes Intravenous Every 12 hours 01/03/22 1910 01/08/22 2209   01/03/22 1930  vancomycin (VANCOREADY) IVPB 1250 mg/250 mL        1,250 mg 166.7 mL/hr over 90 Minutes Intravenous  Once 01/03/22 1910 01/03/22 2219   01/02/22 2100  azithromycin (ZITHROMAX) 500 mg in sodium chloride 0.9 % 250 mL IVPB        500 mg 250  mL/hr  Intravenous Every 24 hours 12/14/2021 2248 01/06/22 2149   01/02/22 2000  cefTRIAXone (ROCEPHIN) 2 g in sodium chloride 0.9 % 100 mL IVPB  Status:  Discontinued        2 g 200 mL/hr over 30 Minutes Intravenous Every 24 hours 12/13/2021 2248 01/03/22 1733   12/16/2021 2030  cefTRIAXone (ROCEPHIN) 1 g in sodium chloride 0.9 % 100 mL IVPB        1 g 200 mL/hr over 30 Minutes Intravenous  Once 12/28/2021 2025 12/08/2021 2141   12/14/2021 2030  azithromycin (ZITHROMAX) 500 mg in sodium chloride 0.9 % 250 mL IVPB        500 mg 250 mL/hr over 60 Minutes Intravenous  Once 12/07/2021 2025 12/30/2021 2312        Assessment/Plan ischemic bowel with perforation S/p exploratory laparotomy,  R colectomy, abthera placement 01/24/2022 Dr. Rosendo Gros S/p exploratory laparotomy, intestinal anastomosis, abdominal closure 02/02/2022 Dr. Rosendo Gros -  POD 3, POD 1  - afebrile VSS  - AM labs are pending, received 2u pRBC yesterday 8/9  - continue TNA and await bowel function  - wet-to-dry dressing to midline BID.   FEN: PICC/TPN, NG to LIWS, ok for meds per tube  ID: Zosyn; tentatively plan for 5d post-op (stop date 8/14 at 2359) VTE: SCD's, ok to start hep gtt w/o bolus today if hgb stable this AM  Foley: continue for strict I&O Dispo: ICU    Below per CCM -- Septic shock due to above Cardiac arrest 7/30, 7 mins CPR Hx cardiomyopathy COPD Afib with RVR    LOS: 13 days   I reviewed nursing notes, hospitalist notes, last 24 h vitals and pain scores, last 48 h intake  and output, last 24 h labs and trends, and last 24 h imaging results.   Obie Dredge, PA-C [facility] Please see Amion for pager number during day hours 7:00am-4:30pm

## 2022-01-14 NOTE — TOC Progression Note (Addendum)
Transition of Care [facility]) - Progression Note    Patient Details  Name: [name] MRN: [id number] Date of Birth: December 29, 1940  Transition of Care [facility]) CM/SW [place], [place] Phone Number: 01/14/2022, 12:35 PM  Clinical Narrative:     Patient on TPN and has NG tube. CSW following to refax out for SNF placement and submit clinicals to passr when appropriate.CSW will continue to follow and assist with patients dc planning needs.   Expected Discharge Plan: [place] Barriers to Discharge: Continued Medical Work up  Expected Discharge Plan and Services Expected Discharge Plan: [place] In-house Referral: Clinical Social Work Discharge Planning Services: CM Consult Post Acute Care Choice: [place] arrangements for the past 2 months: Single Family Home                                       Social Determinants of Health (SDOH) Interventions    Readmission Risk Interventions     No data to display

## 2022-01-14 NOTE — Progress Notes (Signed)
Nutrition Follow-up  DOCUMENTATION CODES:   Not applicable  INTERVENTION:   TPN to meet 100% nutrition needs; TPN order per Pharmacy -Pt with possibly refeeding, recommend continued close monitoring of potassium, phosphorus and magnesium with supplementation  Recommend continuing TPN at goal rate until bowel function returns and able to introduce nutrition via enteral route and pt demonstrating tolerance of EN with titration go goal   NUTRITION DIAGNOSIS:   Inadequate oral intake related to acute illness as evidenced by NPO status.  Being addressed via TPN  GOAL:   Patient will meet greater than or equal to 90% of their needs  Progressing  MONITOR:   Vent status, Labs, Weight trends, I & O's (TPN, GI function)  REASON FOR ASSESSMENT:   Consult, Ventilator Enteral/tube feeding initiation and management  ASSESSMENT:   81 yo female admitted with acute respiratory failure related to COPD exacerbation and pneumonia, went into PEA arrest and required intubation. PMH includes A.fib, breast cancer, HTN, GERD, asthma, melanoma  7/28 Admit 7/30 IHCA x 7 minutes, Intubated 7/31 Chest tube placed, TF initiatede 8/01 Pt pulled out chest tube, CT head negative 8/02 Extubated 8/04 Cortrak placed, Cortrak pulled out by pt and replaced again, TF started 8/06 Abd pain/Abd fullness; KUB suggestive of ileus, TF on hold, Surgery consulted, CT abd with ischemic colitis, NG tube to LIS, Cortrak removed 8/07 Febrile, worsening abd pain, more somnolent, OR for ex lap and colon resection due to ischemic colon with contained perf, feculent peritonitis, abthera wound VAC placed, intubated 8/08 TPN consulted placed 8/09 Return to OR for ex lap, intestinal anastomosis, abd closure, TPN initiated 8/10 Extubated, TPN increased to goal rate  NPO, pt awake but confused. Sitting up in chair +some abd pain (expected oer surgery); noted surgery expecting abd abscess due to contamination on initial  surgery  No BM, +flatus  Dietitian recommended TPN on 8/08, received verbal from PCCM to start and consult was placed. However, TPN not started until 8/09 at 1800  Noted phosphorus, magnesium and potassium all dropped with increase in TPN. Concern for refeeding. Recommend continuing to check and supplement. Mag and phosphorus being supplemented outside the TPN  Noted high UOP with diuresis, 8.4 L in 24 hours Weight up post-op but trending back down. Weight 69.8 kg today  Labs: sodium 134 (L), CBGs 74-127, phosphorus 2.1 (L), magnesium 1.5 (L) potassium wdl, Creatinine wdl Meds: ss novolog, potassium phosphate, mag sulfate  Diet Order:   Diet Order             Diet NPO time specified  Diet effective now                   EDUCATION NEEDS:   Not appropriate for education at this time  Skin:  Skin Assessment: Skin Integrity Issues: Skin Integrity Issues:: Incisions Wound Vac: removed Incisions: abdomen  Last BM:  8/02 type 7 medium  Height:   Ht Readings from Last 1 Encounters:  01/04/22 5' (1.524 m)    Weight:   Wt Readings from Last 1 Encounters:  01/15/22 69.8 kg     BMI:  Body mass index is 30.05 kg/m.  Estimated Nutritional Needs:   Kcal:  1500-1700 kcals  Protein:  85-100 g  Fluid:  >/= 1.5 L   Kerman Passey MS, RDN, LDN, CNSC Registered Dietitian 3 Clinical Nutrition RD Pager and On-Call Pager Number Located in [place]

## 2022-01-14 NOTE — Progress Notes (Addendum)
NAME:  [name], MRN:  [id number], DOB:  Sep 27, 1940, LOS: 46 ADMISSION DATE:  12/18/2021, CONSULTATION DATE:  01/28/2022 REFERRING MD:  Bonner Puna, CHIEF COMPLAINT:  Hypotension, abdominal distention    History of Present Illness:  81 yo female admited for CAP causing respiratory failure, had a cardiac arrest after admission and was moved to the ICU.  Chest tube placed for parapneumonic effusion, extubated on 8/2. Sent out of ICU on 8/5. 8/6 developed ischemic colitis initially treated conservatively but this morning we were called back 8/7 for worsening shock, confusion.  She is going to the OR today.   Pertinent  Medical History  Breast ca on letrozole Afib on [place] Hospital Events: Including procedures, antibiotic start and stop dates in addition to other pertinent events   7/28 admit, Ceftriaxone/Azithro 7/29 last dose xeralto  7/30 IHCA x 7 min, Ceftriaxone>Cefepime 7/31 Chest tube placed, heparin gtt started 8/1 patient remove chest tube.  Head CT obtained >negative.  Severely agitated overnight.  Vancomycin stopped.  Azithromycin stopped. 8/6 Transferred to [facility] [facility] [place] 8/7 Called CCM back for abd, Distention , hypotension and a fib with RVR, plan is for  urgent OR pending goals of care discussion then ICU post op if husband is in agreement. 8/10 Abdomen closed yesterday, SBT/SAT today  Interim History / Subjective:  Taken back to the OR and abdomen closed 8/9, on low dose levophed, weaning and awake on vent this AM  Objective   Blood pressure 111/71, pulse 94, temperature 98.1 F (36.7 C), resp. rate 17, height 5' (1.524 m), weight 73.1 kg, SpO2 100 %. PAP: (42-54)/(16-27) 53/21  Vent Mode: PRVC FiO2 (%):  [40 %] 40 % Set Rate:  [16 bmp] 16 bmp Vt Set:  [360 mL] 360 mL PEEP:  [5 cmH20] 5 cmH20 Plateau Pressure:  [17 cmH20-21 cmH20] 19 cmH20   Intake/Output Summary (Last 24 hours) at 01/14/2022 0813 Last data filed at 01/14/2022 0600 Gross per 24 hour   Intake 2106.46 ml  Output 1215 ml  Net 891.46 ml    Filed Weights   01/12/22 0500 01/21/2022 0500 01/14/22 0500  Weight: 73.3 kg 77 kg 73.1 kg    General:  ill-appearing F intubated and sedated HEENT: MM pink/moist, ETT in place, sclera anicteric Neuro: opens eyes and following commands with SAT CV: s1s2 irregular, no m/r/g PULM:  no rhonchi or wheezing, mechanically ventilated, equal chest rise GI: soft, no bleeding or oozing on surgical site dressing, abdomen firm Extremities: warm/dry, 2+ edema  Skin: no rashes or lesions  Significant labs WBC 18k Hgb 10 Creatinine 0.7  Resolved Hospital Problem list     Assessment & Plan:   Septic shock Ischemic colitis with perforation. Possible PNA ? Component of cardiogenic shock:  co-ox initially on the low side. 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 -HOB elevated 30 degrees. -Plateau pressures less than 30 cm H20.  -Follow chest x-ray, ABG prn.   -Bronchial hygiene and RT/bronchodilator protocol. 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% on 7/31 - IV heparin on hold perioperatively - Amiodarone drip - Appreciate cardiology assistance  - Will eventually need follow up Echo -start diuresis and albumin today, monitor response  Hypokalemia Hypomag - Trend BMET - Supp mag, K as indicated  Nutrition  - Will request PICC for TPN -  NPO   Best Practice (right click and "Reselect all SmartList Selections" daily)   Diet/type: NPO DVT prophylaxis: SCD GI prophylaxis: H2B Lines: Central line Swan Foley:  Foley Code Status:  full code Last date of multidisciplinary goals of care discussion [improving, will attempt to update family today  Labs   CBC: Recent Labs  Lab 01/10/22 1006 01/12/2022 0041 01/26/2022 1102 01/12/22 0341 01/12/22 0454 01/12/22 0650 01/05/2022 0524 02/01/2022 1101  WBC 33.2* 29.3*  --  23.6*  --   --  11.2* 11.5*  NEUTROABS 30.9* 25.8*  --  21.0*  --   --  9.7*  --   HGB 13.3 12.1   < > 10.2* 10.5* 9.5* 6.9* 8.4*  HCT 39.2 37.0   < > 30.5* 31.0* 28.0* 20.9* 25.1*  MCV 106.8* 110.1*  --  108.2*  --   --  107.7* 102.4*  PLT 246 232  --  268  --   --  143* 159   < > = values in this interval not displayed.     Basic Metabolic Panel: Recent Labs  Lab 01/10/22 0046 02/01/2022 0041 01/14/2022 3614 01/07/2022 1102 01/23/2022 1252 01/10/2022 1305 01/10/2022 1409 01/12/22 0341 01/12/22 0454 01/12/22 0650 01/12/2022 0524  NA 137 139 139   < >  --    < > 136 132* 133* 132* 132*  K 4.6 4.6 5.2*   < >  --    < > 4.4 3.9 4.0 3.6 3.8  CL 105 100 103  --   --   --  102 101  --   --  102  CO2 '26 28 27  '$ --   --   --  27 23  --   --  24  GLUCOSE 134* 87 106*  --   --   --  208* 255*  --   --  86  BUN '22 16 16  '$ --   --   --  17 20  --   --  19  CREATININE 0.63 0.61 0.66  --   --   --  0.59 0.61  --   --  0.57  CALCIUM 9.7 9.9 10.1  --   --   --  8.8* 8.4*  --   --  8.6*  MG 2.0  --   --   --  1.7  --  1.7 1.8  --   --  1.9  PHOS  --   --   --   --   --   --   --  3.6  --   --  3.1   < > = values in this interval not displayed.    GFR: Estimated Creatinine Clearance: 51.7 mL/min (by C-G formula based on SCr of 0.57 mg/dL). Recent Labs  Lab 01/10/22 1800 01/19/2022 0041 01/17/2022 0608 01/17/2022 1252 01/12/22 0341 01/10/2022 0524 01/16/2022 1101  PROCALCITON  --   --   --  0.38  --   --   --   WBC  --  29.3*   --   --  23.6* 11.2* 11.5*  LATICACIDVEN 1.6  --  2.6* 1.2 2.2*  --   --      Liver Function Tests: Recent Labs  Lab 02/04/2022 0041 01/15/2022 1409 01/12/22 0341 01/22/2022 0524  AST 55* 38 32 24  ALT 88* 59* 47* 27  ALKPHOS 130* 101 86 60  BILITOT 2.4* 2.4* 1.3* 1.2  PROT 5.7* 4.5* 4.7* 4.5*  ALBUMIN 1.9* <1.5* <1.5* 2.1*    No results for input(s): "LIPASE", "AMYLASE" in the last 168 hours. No results for input(s): "AMMONIA" in the last 168 hours.  ABG    Component Value Date/Time   PHART 7.410 01/12/2022 0454   PCO2ART 41.3 01/12/2022 0454   PO2ART 173 (H) 01/12/2022 0454   HCO3 23.6 01/12/2022 0650   TCO2 25 01/12/2022 0650   ACIDBASEDEF 2.0 01/12/2022 0650   O2SAT 70.7 01/12/2022 0953     Coagulation Profile: No results for input(s): "INR", "PROTIME" in the last 168 hours.  Cardiac Enzymes: No results for input(s): "CKTOTAL", "CKMB", "CKMBINDEX", "TROPONINI" in the last 168 hours.  HbA1C: Hgb A1c MFr Bld  Date/Time Value Ref Range Status  01/03/2022 09:18 PM 4.9 4.8 - 5.6 % Final    Comment:    (NOTE) Pre diabetes:          5.7%-6.4%  Diabetes:              >6.4%  Glycemic control for   <7.0% adults with diabetes     CBG: Recent Labs  Lab 01/23/2022 1553 01/07/2022 1947 01/14/22 0006 01/14/22 0558 01/14/22 0756  GLUCAP 92 74 119* 127* 115*     Critical care time: 40 mins    CRITICAL CARE Performed by: Otilio Carpen Tavarion Babington   Total critical care time: 40 minutes  Critical care time was exclusive of separately billable procedures and treating other patients.  Critical care was necessary to treat or prevent imminent or life-threatening deterioration.  Critical care was time spent personally by me on the following activities: development of treatment plan with patient and/or surrogate as well as nursing, discussions with consultants, evaluation of patient's response to treatment, examination of patient, obtaining history from patient or surrogate,  ordering and performing treatments and interventions, ordering and review of laboratory studies, ordering and review of radiographic studies, pulse oximetry and re-evaluation of patient's condition.   Otilio Carpen Tenlee Wollin, PA-C [facility] See Amion for pager If no response to pager , please call [phone] until 7pm After 7:00 pm call [place]  [phone]

## 2022-01-14 NOTE — Progress Notes (Signed)
PHARMACY - TOTAL PARENTERAL NUTRITION CONSULT NOTE   Indication: bowel perf s/p repair with open abdomen, malnourished and npo   Patient Measurements: Height: 5' (152.4 cm) Weight: 73.1 kg (161 lb 2.5 oz) IBW/kg (Calculated) : 45.5 TPN AdjBW (KG): 51 Body mass index is 31.47 kg/m.  Assessment: 81 yo female admited for CAP causing respiratory failure, had a cardiac arrest after admission and was moved to the ICU.  Chest tube placed for parapneumonic effusion, extubated on 8/2. Sent out of ICU on 8/5. 8/6 developed ischemic colitis initially treated conservatively but this morning we were called back 8/7 for worsening shock, confusion.  POD #1 s/p ex lap, R colectomy for ischemic bowel with perforation. Pharmacy consulted to start TPN.  Glucose / Insulin: 4 units of SSI on sensitive SSI Electrolytes: Na 134, K 3.7 (diuresing), Cl 102, CO2 24, Mag 2.1, Phos 3.3 Renal: BUN 18, Scr 0.7 Hepatic: Albumin 2.3, TG within normal limits (Propofol is now off as planning to extubate today) Intake / Output; MIVF: 0.3 ml/kg/hr, drains 550 mls, 250 mls NG output, diuresing due to volume overload GI Imaging: GI Surgeries / Procedures:  8/7 Ex lap, colon resection 8/9 Ex lap, intestinal anastomosis, wound closure  Central access: PICC ordered for 8/3 TPN start date: Plan to start 8/9  Nutritional Goals: Goal TPN rate is 63 mL/hr (provides 86 g of protein and 1583 kcals per day)  RD Assessment: Estimated Needs Total Energy Estimated Needs: 1500-1700 kcals Total Protein Estimated Needs: 85-100 g Total Fluid Estimated Needs: >/= 1.5 L  Current Nutrition:  NPO  Plan:  Increase TPN to goal rate of 63 ml/hr which will provide about 100% of needs Electrolytes in TPN: Na 50 mEq/L, K 50 mEq/L, Ca 5 mEq/L, Mg 5 mEq/L, Phos 5 mmol/L, Max acetate  Add standard MVI and trace elements to TPN  Continue sensitive SSI frequency q4hrs Will Monitor TPN labs on Mon/Thurs, and daily during initial  titration Primary team supplementing potassium today.  Recheck labs in AM.   Sloan Leiter, PharmD, BCPS, BCCCP Clinical Pharmacist Please refer to [facility] for [place] numbers 01/14/2022, 7:30 AM

## 2022-01-14 NOTE — Progress Notes (Signed)
[facility] Progress Note Patient Name: [name] DOB: 14-Jun-1940 MRN: [id number]   Date of Service  01/14/2022  HPI/Events of Note  Hypokalemia - K+ = 3.4 and Creatinine = 0.71. Patient being diuresed with Lasix.   eICU Interventions  Plan: Will replace K+.     Intervention Category Major Interventions: Electrolyte abnormality - evaluation and management  Othal Kubitz [place] 01/14/2022, 10:00 PM

## 2022-01-14 NOTE — Procedures (Signed)
Extubation Procedure Note  Patient Details:   Name: [name] DOB: 01/17/1941 MRN: [id number]   Airway Documentation:    Vent end date: 01/14/22 Vent end time: 1106   Evaluation  O2 sats: stable throughout Complications: No apparent complications Patient did tolerate procedure well. Bilateral Breath Sounds: Diminished, Clear   Yes  Patient extubated per MD order. Positive cuff leak. No stridor noted. Vitals are stable on 4L [facility]. Patient has good cough. RN at bedside.  Koralee Wedeking H Abyan Cadman 01/14/2022, 11:06 AM

## 2022-01-14 NOTE — Evaluation (Signed)
Physical Therapy Evaluation Patient Details Name: [name] MRN: [id number] DOB: Nov 24, 1940 Today's Date: 01/14/2022  History of Present Illness  81 y.o. female admitted 12/15/2021 with CAP.  7/30 AMS, head CT negative for acute abnormality. Code blue 7/30, ROSC achieved after 7 min CPR, pt intubated. Chest tube insertion 7/31; pt removed CT 8/1. ETT 7/30-8/2. Continued AMS, hallucinations overnight 8/2; head CT 8/2 remains negative for acute abnormality. EEG 8/2 with no seizure activity. Improved cognition 8/5, but persistent tachycardia/afib with RVR. 8/6 ischemic colitis. 8/7 shock with intubation and to OR for ex lap. 8/9 abdomen closure. 8/10 extubation.  PMH includes afib, HTN, breast CA, melanoma, asthma, bilateral TKA, anxiety.  Clinical Impression  Pt awake and agreeable to mobility post extubation. Pt with soft spoken voice and difficult to hear her responses throughout. Pt oriented to self and city only. Pt with edematous LUE with weeping, cyanotic toes bil feet, decreased strength, limited ability to transfers and decreased functional mobility. Pt with decline in function since last P.T. visit prior to order discontinuation. Pt currently requiring mod-max+2 to stand and not safe to transfers OOB at this time. RN present during session to assist with mobility and aware of NGT sliding out despite tape still secured to nose with sitting EOB. Pt will benefit from acute therapy to maximize mobility, safety and function to decrease burden of care.   HR 130 SPO2 98% on 4L BP 138/80       Recommendations for follow up therapy are one component of a multi-disciplinary discharge planning process, led by the attending physician.  Recommendations may be updated based on patient status, additional functional criteria and insurance authorization.  Follow Up Recommendations Skilled nursing-short term rehab (<3 hours/day) Can patient physically be transported by private vehicle: No    Assistance  Recommended at Discharge Frequent or constant Supervision/Assistance  Patient can return home with the following  Two people to help with walking and/or transfers;Two people to help with bathing/dressing/bathroom;Direct supervision/assist for medications management;Assistance with feeding;Assistance with cooking/housework;Direct supervision/assist for financial management;Assist for transportation    Equipment Recommendations Other (comment) (TBD with progression)  Recommendations for Other Services       Functional Status Assessment Patient has had a recent decline in their functional status and demonstrates the ability to make significant improvements in function in a reasonable and predictable amount of time.     Precautions / Restrictions Precautions Precautions: Fall;Other (comment) Precaution Comments: watch HR, NGT      Mobility  Bed Mobility Overal bed mobility: Needs Assistance Bed Mobility: Supine to Sit, Sit to Supine     Supine to sit: Max assist, HOB elevated     General bed mobility comments: HOB 35 degrees with max assist of pad, multimodal cues and assist to pivot from supine to sitting. REturn to bed with max +2 assist to pivot back to bed to prevent sliding off bed. Total +2 to slide toward [facility]    Transfers Overall transfer level: Needs assistance   Transfers: Sit to/from Stand Sit to Stand: Mod assist, Max assist, +2 physical assistance           General transfer comment: initial stand mod assist EOB with left knee blocked and physical assist to rise. Max +2 on 2nd trial to rise from surface to change linens under pt in standing. unsafe to progress OOB. NGT fell out with sitting EOB, RN present and tape secured on nose however tubing slid out from tape    Ambulation/Gait  Stairs            Wheelchair Mobility    Modified Rankin (Stroke Patients Only)       Balance Overall balance assessment: Needs assistance    Sitting balance-Leahy Scale: Poor Sitting balance - Comments: EOB with minguard for balance and knee blocked to prevent sliding off bed     Standing balance-Leahy Scale: Zero Standing balance comment: knee blocked and mod-max assist to stand                             Pertinent Vitals/Pain Pain Assessment Pain Assessment: No/denies pain    Home Living Family/patient expects to be discharged to:: Private residence Living Arrangements: Spouse/significant other Available Help at Discharge: Family;Available 24 hours/day Type of Home: House Home Access: Stairs to enter   [employer] of Steps: 1   Home Layout: One level Home Equipment: Conservation officer, nature (2 wheels);Cane - single point Additional Comments: taken by spouse on initial eval- pt unable to provide    Prior Function Prior Level of Function : Independent/Modified Independent;Driving             Mobility Comments: Independent without DME; drives; retired Engineer, water. Enjoys reading; used to ride horses and play tennis       Journalist, newspaper        Extremity/Trunk Assessment   Upper Extremity Assessment Upper Extremity Assessment: Generalized weakness    Lower Extremity Assessment Lower Extremity Assessment: Generalized weakness;LLE deficits/detail;RLE deficits/detail RLE Deficits / Details: grossly 40 degrees AAROM knee flexion, would not bend on her own with cues. pt barely able to lift foot off bed and could not follow commands consistently. pt with blue toes with 4-5 sec cap refill LLE Deficits / Details: grossly 40 degrees AAROm knee flexion and 90 degree hip flexion.pt with blue toes with 4-5 sec cap refill    Cervical / Trunk Assessment Cervical / Trunk Assessment: Normal  Communication   Communication: No difficulties  Cognition Arousal/Alertness: Awake/alert Behavior During Therapy: Flat affect Overall Cognitive Status: Impaired/Different from baseline Area of Impairment:  Orientation, Attention, Memory, Following commands                 Orientation Level: Disoriented to, Place, Time, Situation Current Attention Level: Focused Memory: Decreased short-term memory Following Commands: Follows one step commands inconsistently, Follows one step commands with increased time Safety/Judgement: Decreased awareness of safety, Decreased awareness of deficits   Problem Solving: Slow processing, Requires verbal cues, Requires tactile cues General Comments: pt oriented to self and city but not place or situation. pt constantly grabbing at therapist pockets or bed with mobility with max assist to redirect and change hand position. Pt with no awareness of deficits        General Comments      Exercises     Assessment/Plan    PT Assessment Patient needs continued PT services  PT Problem List Decreased activity tolerance;Decreased balance;Decreased mobility;Decreased cognition;Decreased knowledge of precautions;Decreased safety awareness;Decreased strength;Decreased range of motion;Decreased knowledge of use of DME       PT Treatment Interventions DME instruction;Gait training;Stair training;Functional mobility training;Therapeutic activities;Therapeutic exercise;Balance training;Patient/family education;Cognitive remediation    PT Goals (Current goals can be found in the Care Plan section)  Acute Rehab PT Goals Patient Stated Goal: return home PT Goal Formulation: With patient Time For Goal Achievement: 01/28/22 Potential to Achieve Goals: Fair    Frequency Min 3X/week     Co-evaluation  AM-PAC PT "6 Clicks" Mobility  Outcome Measure Help needed turning from your back to your side while in a flat bed without using bedrails?: A Lot Help needed moving from lying on your back to sitting on the side of a flat bed without using bedrails?: A Lot Help needed moving to and from a bed to a chair (including a wheelchair)?: Total Help  needed standing up from a chair using your arms (e.g., wheelchair or bedside chair)?: A Lot Help needed to walk in hospital room?: Total Help needed climbing 3-5 steps with a railing? : Total 6 Click Score: 9    End of Session   Activity Tolerance: Patient tolerated treatment well Patient left: in bed;with call bell/phone within reach;with nursing/sitter in room Nurse Communication: Mobility status;Need for lift equipment PT Visit Diagnosis: Other abnormalities of gait and mobility (R26.89);Difficulty in walking, not elsewhere classified (R26.2);Muscle weakness (generalized) (M62.81)    Time: 4098-1191 PT Time Calculation (min) (ACUTE ONLY): 33 min   Charges:   PT Evaluation $PT Eval Moderate Complexity: 1 Mod PT Treatments $Therapeutic Activity: 8-22 mins        Bayard Males, PT Acute Rehabilitation Services Office: [phone]   Sandy Salaam Yaretzi Ernandez 01/14/2022, 12:45 PM

## 2022-01-14 NOTE — Progress Notes (Signed)
SLP Cancellation Note  Patient Details Name: [name] MRN: [id number] DOB: 1940/12/19   Cancelled treatment:       Reason Eval/Treat Not Completed: Medical issues which prohibited therapy (remains on vent). Will continue to follow.     Osie Bond., M.A. [place] Office [phone]  Secure chat preferred  01/14/2022, 7:49 AM

## 2022-01-15 DIAGNOSIS — I469 Cardiac arrest, cause unspecified: Secondary | ICD-10-CM | POA: Diagnosis not present

## 2022-01-15 DIAGNOSIS — I5181 Takotsubo syndrome: Secondary | ICD-10-CM | POA: Diagnosis not present

## 2022-01-15 LAB — TYPE AND SCREEN
ABO/RH(D): O POS
Antibody Screen: NEGATIVE
Unit division: 0
Unit division: 0

## 2022-01-15 LAB — BASIC METABOLIC PANEL
Anion gap: 8 (ref 5–15)
BUN: 19 mg/dL (ref 8–23)
CO2: 33 mmol/L — ABNORMAL HIGH (ref 22–32)
Calcium: 9.3 mg/dL (ref 8.9–10.3)
Chloride: 97 mmol/L — ABNORMAL LOW (ref 98–111)
Creatinine, Ser: 0.58 mg/dL (ref 0.44–1.00)
GFR, Estimated: >60 mL/min (ref >60)
Glucose, Bld: 139 mg/dL — ABNORMAL HIGH (ref 70–99)
Potassium: 3.7 mmol/L (ref 3.5–5.1)
Sodium: 138 mmol/L (ref 135–145)

## 2022-01-15 LAB — MAGNESIUM: Magnesium: 1.5 mg/dL — ABNORMAL LOW (ref 1.7–2.4)

## 2022-01-15 LAB — BPAM RBC
Blood Product Expiration Date: 202308122359
Blood Product Expiration Date: 202308122359
ISSUE DATE / TIME: 202308071030
ISSUE DATE / TIME: 202308090741
Unit Type and Rh: 5100
Unit Type and Rh: 5100

## 2022-01-15 LAB — APTT: aPTT: 32 seconds (ref 24–36)

## 2022-01-15 LAB — GLUCOSE, CAPILLARY
Glucose-Capillary: 149 mg/dL — ABNORMAL HIGH (ref 70–99)
Glucose-Capillary: 137 mg/dL — ABNORMAL HIGH (ref 70–99)
Glucose-Capillary: 112 mg/dL — ABNORMAL HIGH (ref 70–99)

## 2022-01-15 LAB — TRIGLYCERIDES: Triglycerides: 105 mg/dL (ref <150)

## 2022-01-15 LAB — PHOSPHORUS: Phosphorus: 2.1 mg/dL — ABNORMAL LOW (ref 2.5–4.6)

## 2022-01-15 MED ORDER — BISACODYL 10 MG RE SUPP
10.0000 mg | Freq: Once | RECTAL | Status: AC
  Administered 2022-01-15: 10 mg via RECTAL
  Filled 2022-01-15: qty 1

## 2022-01-15 MED ORDER — MAGNESIUM SULFATE 2 GM/50ML IV SOLN
2.0000 g | Freq: Once | INTRAVENOUS | Status: AC
Start: 2022-01-15 — End: 2022-01-15
  Administered 2022-01-15: 2 g via INTRAVENOUS
  Filled 2022-01-15: qty 50

## 2022-01-15 MED ORDER — TRAVASOL 10 % IV SOLN
INTRAVENOUS | Status: AC
  Filled 2022-01-15: qty 861.8

## 2022-01-15 MED ORDER — HEPARIN (PORCINE) 25000 UT/250ML-% IV SOLN
1200.0000 [IU]/h | INTRAVENOUS | Status: AC
  Administered 2022-01-15: 900 [IU]/h via INTRAVENOUS
  Administered 2022-01-16: 1050 [IU]/h via INTRAVENOUS
  Administered 2022-01-17 – 2022-01-19 (×3): 1200 [IU]/h via INTRAVENOUS
  Filled 2022-01-15 (×5): qty 250

## 2022-01-15 MED ORDER — POTASSIUM PHOSPHATES 15 MMOLE/5ML IV SOLN
30.0000 mmol | Freq: Once | INTRAVENOUS | Status: AC
  Administered 2022-01-15: 30 mmol via INTRAVENOUS
  Filled 2022-01-15: qty 10

## 2022-01-15 NOTE — Progress Notes (Signed)
SLP Cancellation Note  Patient Details Name: [name] MRN: [id number] DOB: 28-Aug-1940   Cancelled treatment:       Reason Eval/Treat Not Completed: Per RN, pt s/p exploratory laparotomy and remaining NPO pending active bowel function. SLP to continue f/u.     Ellwood Dense, [place], [place] Acute Rehabilitation Services Office Number: [phone]  Acie Fredrickson 01/15/2022, 1:06 PM

## 2022-01-15 NOTE — Progress Notes (Addendum)
Physical Therapy Treatment Patient Details Name: [name] MRN: [id number] DOB: 01/29/1941 Today's Date: 01/15/2022   History of Present Illness 81 y.o. female admitted 12/22/2021 with CAP.  7/30 AMS, head CT negative for acute abnormality. Code blue 7/30, ROSC achieved after 7 min CPR, pt intubated. Chest tube insertion 7/31; pt removed CT 8/1. ETT 7/30-8/2. Continued AMS, hallucinations overnight 8/2; head CT 8/2 remains negative for acute abnormality. EEG 8/2 with no seizure activity. Improved cognition 8/5, but persistent tachycardia/afib with RVR. 8/6 ischemic colitis. 8/7 shock with intubation and to OR for ex lap. 8/9 abdomen closure. 8/10 extubation.  PMH includes afib, HTN, breast CA, melanoma, asthma, bilateral TKA, anxiety.    PT Comments    Pt pleasant with improved mobility and cognition from prior date. Pt able to transfer with mod assist and initiate limited gait. Pt with tachycardia HR 150 with limited gait and requires close chair follow. Pt oriented to self and place but not time. Pt with significant assist for balance and mobility and encouraged continued mobility OOB with nursing staff. Pt with significant progress from last session and if able to demonstrate continued progression would benefit from AIR prior to D/C.  HR 120-150 94-98% on 2L BP 130/66    Recommendations for follow up therapy are one component of a multi-disciplinary discharge planning process, led by the attending physician.  Recommendations may be updated based on patient status, additional functional criteria and insurance authorization.  Follow Up Recommendations  Acute inpatient rehab (3hours/day) Can patient physically be transported by private vehicle: No   Assistance Recommended at Discharge Frequent or constant Supervision/Assistance  Patient can return home with the following Direct supervision/assist for medications management;Assistance with feeding;Assistance with cooking/housework;Direct  supervision/assist for financial management;Assist for transportation;A lot of help with walking and/or transfers;A lot of help with bathing/dressing/bathroom   Equipment Recommendations  Rolling walker (2 wheels);BSC/3in1    Recommendations for Other Services Rehab consult;OT consult     Precautions / Restrictions Precautions Precautions: Fall;Other (comment) Precaution Comments: watch HR     Mobility  Bed Mobility Overal bed mobility: Needs Assistance Bed Mobility: Rolling, Sidelying to Sit Rolling: Mod assist Sidelying to sit: Mod assist, HOB elevated       General bed mobility comments: mod assist to roll to left with use of rail, cues for sequence and assist of pad. Assist to rise from surface and scoot to EOB, HOB 25 degrees    Transfers Overall transfer level: Needs assistance   Transfers: Sit to/from Stand Sit to Stand: Mod assist           General transfer comment: mod assist to rise from bed and chair x 4 total trials with cues for hand placement and safety. Bed to chair pivot with RW with mod assist, max cues and physical assist for balance    Ambulation/Gait Ambulation/Gait assistance: Mod assist Gait Distance (Feet): 10 Feet Assistive device: Rolling walker (2 wheels) Gait Pattern/deviations: Step-to pattern, Decreased stride length, Trunk flexed   Gait velocity interpretation: <1.31 ft/sec, indicative of household ambulator   General Gait Details: pt with posterior left lean, flexed trunk and tendency to lean trunk toward right. max cues for posture, balance and proximity to RW. HR max 150 with gait. Pt walked 8' then 10' with seated rest and close chair follow   Stairs             Wheelchair Mobility    Modified Rankin (Stroke Patients Only)       Balance Overall  balance assessment: Needs assistance   Sitting balance-Leahy Scale: Poor Sitting balance - Comments: min assist sitting EOB     Standing balance-Leahy Scale:  Poor Standing balance comment: mod assist in standing                            Cognition Arousal/Alertness: Awake/alert Behavior During Therapy: Flat affect Overall Cognitive Status: Impaired/Different from baseline Area of Impairment: Orientation, Attention, Memory, Following commands                 Orientation Level: Disoriented to, Time, Situation Current Attention Level: Sustained Memory: Decreased short-term memory Following Commands: Follows one step commands consistently, Follows one step commands with increased time Safety/Judgement: Decreased awareness of safety, Decreased awareness of deficits   Problem Solving: Slow processing, Requires verbal cues, Requires tactile cues General Comments: pt not oriented to day or month. improved command following with decreased awareness        Exercises Total Joint Exercises Heel Slides: AROM, Both, 10 reps, Supine Straight Leg Raises: AAROM, Both, 10 reps, Supine    General Comments        Pertinent Vitals/Pain Pain Assessment Pain Assessment: No/denies pain    Home Living                          Prior Function            PT Goals (current goals can now be found in the care plan section) Progress towards PT goals: Progressing toward goals    Frequency    Min 3X/week      PT Plan Discharge plan needs to be updated    Co-evaluation              AM-PAC PT "6 Clicks" Mobility   Outcome Measure  Help needed turning from your back to your side while in a flat bed without using bedrails?: A Lot Help needed moving from lying on your back to sitting on the side of a flat bed without using bedrails?: A Lot Help needed moving to and from a bed to a chair (including a wheelchair)?: A Lot Help needed standing up from a chair using your arms (e.g., wheelchair or bedside chair)?: A Lot Help needed to walk in hospital room?: Total Help needed climbing 3-5 steps with a railing? :  Total 6 Click Score: 10    End of Session Equipment Utilized During Treatment: Oxygen;Gait belt Activity Tolerance: Patient tolerated treatment well Patient left: in chair;with chair alarm set;with call bell/phone within reach Nurse Communication: Mobility status PT Visit Diagnosis: Other abnormalities of gait and mobility (R26.89);Difficulty in walking, not elsewhere classified (R26.2);Muscle weakness (generalized) (M62.81)     Time: 0102-7253 PT Time Calculation (min) (ACUTE ONLY): 30 min  Charges:  $Gait Training: 8-22 mins $Therapeutic Activity: 8-22 mins                     Bayard Males, PT Acute Rehabilitation Services Office: [place] 01/15/2022, 10:32 AM

## 2022-01-15 NOTE — Progress Notes (Signed)
Consulted for I.V. to be started in L arm on same arm PICC line placed, o'kd by M.D. for heparin to be infused. Attempts made with ultrasound unsuccessful. Arm weeping, unsuccessful threading catheter after cannulation.RN aware

## 2022-01-15 NOTE — Progress Notes (Addendum)
ANTICOAGULATION CONSULT NOTE   Pharmacy Consult for Heparin Indication: atrial fibrillation  Allergies  Allergen Reactions   Chlorhexidine Gluconate Hives, Swelling and Rash       "ChloraPrep "   Doxycycline Nausea And Vomiting   Codeine Nausea Only and Other (See Comments)    Pt can tolerate when pre-medicated   Doxycycline Nausea And Vomiting   Nsaids     avoid due to gerd   Nsaids Other (See Comments)    Gi bleed   Tylenol [Acetaminophen]     Avoid due to GERD   Adhesive [Tape] Rash    Dermabond   Chlorhexidine Swelling and Rash   Codeine Nausea Only   Tylenol [Acetaminophen] Other (See Comments)    GI bleed    Patient Measurements: Height: 5' (152.4 cm) Weight: 69.8 kg (153 lb 14.1 oz) IBW/kg (Calculated) : 45.5 Heparin Dosing Weight: 60 kg   Vital Signs: Temp: 98.5 F (36.9 C) (08/11 1200) Temp Source: Oral (08/11 1200) BP: 128/96 (08/11 1000) Pulse Rate: 92 (08/11 1100)  Labs: Recent Labs    02/01/2022 0524 01/17/2022 1101 01/14/22 0708 01/14/22 0850 01/14/22 2037 01/15/22 0529  HGB 6.9* 8.4* 11.0* 10.1*  --   --   HCT 20.9* 25.1* 32.4* 30.2*  --   --   PLT 143* 159 220 228  --   --   APTT 30  --  26  --   --  32  CREATININE 0.57  --  0.70  --  0.71 0.58     Estimated Creatinine Clearance: 50.5 mL/min (by C-G formula based on SCr of 0.58 mg/dL).   Medical History: Past Medical History:  Diagnosis Date   Anemia    history of   Anxiety    Arthritis    Asthma    Breast cancer ([place])    Cancer ([place])    skin cancers, one melanoma    Complication of anesthesia    patient woke up during colonoscopy   Concussion    8 yrs. ago due to being thrown from a horse   Dislocated elbow 10/12/2016   left   Family history of breast cancer    GERD (gastroesophageal reflux disease)    History of kidney stones    History of radiation therapy 03/23/17-05/04/17   right chest wall 50.4 Gy in 28 fractions, right axilla 45 Gy in 25 fractions   Hypertension     Hypertensive heart disease without CHF    Keratosis, actinic    Melanoma (H[facility])    Persistent atrial fibrillation ([facility]) 12/16/2016   CHA2DS2VASC score 3   Pneumonia    hx of     Medications:  Scheduled:   sodium chloride   Intravenous Once   arformoterol  15 mcg Nebulization BID   budesonide (PULMICORT) nebulizer solution  0.25 mg Nebulization BID   dextrose  50 mL Intravenous Once   fentaNYL (SUBLIMAZE) injection  25 mcg Intravenous Once   mouth rinse  15 mL Mouth Rinse Q2H   pantoprazole (PROTONIX) IV  40 mg Intravenous Q24H   revefenacin  175 mcg Nebulization Daily    Assessment: 48 yof presenting with acute hypoxemia related to COPD excerbation/PNA complicated by PEA arrest  - on Xarelto PTA for Afib (LD 7/29).   Pharmacy is consulted to resume heparin for atrial fibrillation given patient is stable s/p abdominal closure on 8/10. CrCl is 50.5 ml/min. Last DOAC dose inpatient was 8/5. CBC was stable post abdominal closure. It is unlikely heparin level will  be elevated given time from last DOAC dose and KCENTRA reversal prior to procedure, however I will order aPTT and Xa level just in case.  Goal of Therapy:  Heparin level 0.3-0.7 units/ml aPTT 66-102 seconds Monitor platelets by anticoagulation protocol: Yes   Plan:  Will restart heparin at 900 units/hr without bolus given recent procedure Heparin level and aPTT in 8 hr Plan to change to DOAC once have enteral access Monitor CBC, and for s/sx of bleeding   Thank you for allowing pharmacy to participate in this patient's care.  Reatha Harps, PharmD PGY2 Pharmacy Resident 01/15/2022 2:51 PM Check [email or web address] for unit specific pharmacy number  ADDENDUM::  Pt does not have access to infuse heparin given history of mastectomy on the right and a PICC on the left that requires IV team placement. Per discussion with RN, an EG would be the only other option. Per discussion with CCM, will hold anticoagulation and reevaluate 8/12.  Will continue to monitor GI function to transition to [place] when able.  Thank you for allowing pharmacy to participate in this patient's care.  Reatha Harps, PharmD PGY2 Pharmacy Resident 01/15/2022 4:12 PM Check [email or web address] for unit specific pharmacy number

## 2022-01-15 NOTE — Progress Notes (Addendum)
PHARMACY - TOTAL PARENTERAL NUTRITION CONSULT NOTE   Indication: bowel perf s/p repair with open abdomen, malnourished and npo   Patient Measurements: Height: 5' (152.4 cm) Weight: 69.8 kg (153 lb 14.1 oz) IBW/kg (Calculated) : 45.5 TPN AdjBW (KG): 51 Body mass index is 30.05 kg/m.  Assessment: 81 yo female admited for CAP causing respiratory failure, had a cardiac arrest after admission and was moved to the ICU.  Chest tube placed for parapneumonic effusion, extubated on 8/2. Sent out of ICU on 8/5. On 8/6 she developed ischemic colitis, initially treated conservatively but transferred back to ICU for worsening shock and confusion 8/7.  Now s/p ex lap, R colectomy for ischemic bowel with perforation. Pharmacy consulted to start TPN.  8/11 Likely contraction alkalosis s/p aggressive diuresis  Glucose / Insulin: BG < 180, A1C 4.9, used 3 units of SSI/24 hrs  Electrolytes: K 3.7 (s/p furos IV x3; K 60 mEq per tube + 60 mEq IV), Cl 97, CO2 33, Mag 1.5, Phos 2.1, COCa 10.6 Renal: BUN wnl, Scr 0.58 Hepatic: LFTs wnl, Albumin 2.3, TG 105 (off propofol)   Intake / Output; MIVF: UOP 5 ml/kg/hr GI Imaging: 8/6 KUB: Mild gaseous distension favors ileus  8/6 CT Abd: Interval development of apparent pneumatosis in cecum and ascending colon, concerning for iscehmic colitis; possible bowel obstruction and/or inflammatory ileus  8/7 KUB: diffuse gaseous small bowel and colonic distension GI Surgeries / Procedures:  8/7 Ex lap, colon resection 8/9 Ex lap, intestinal anastomosis, wound closure  Central access: PICC 8/8 TPN start date:  8/9  Nutritional Goals: Goal TPN rate is 63 mL/hr (provides 86 g of protein and 1583 kcals per day)  RD Assessment: Estimated Needs Total Energy Estimated Needs: 1500-1700 kcals Total Protein Estimated Needs: 85-100 g Total Fluid Estimated Needs: >/= 1.5 L  Current Nutrition:  NPO + TPN  Plan:  Continue TPN at goal rate of 63 ml/hr which provides 100% of  estimated needs Electrolytes in TPN: Increase Na 75 mEq/L, Mg 10 mEq/L, Phos 20 mmol/L, LS:LHTD 2:1;  Decrease Ca 2 mEq/L; Continue  K 50 mEq/L  Add standard MVI and trace elements to TPN  Give Kphos 30 mmol x1 and Mg 2g x1  Stop SSI. Check BG q12 hr  Monitor TPN labs daily until stable at goal then on Mon/Thurs F/u diuresis plan   Benetta Spar, PharmD, BCPS, [facility] Clinical Pharmacist  Please check AMION for all [place] phone numbers After 10:00 PM, call [place] [phone]

## 2022-01-15 NOTE — Progress Notes (Addendum)
Progress Note  Patient Name: [name] Date of Encounter: 01/15/2022  [facility] Cardiologist: Kirk Ruths, MD   Subjective   Extubated; mild dyspnea; no CP  Inpatient Medications    Scheduled Meds:  sodium chloride   Intravenous Once   arformoterol  15 mcg Nebulization BID   budesonide (PULMICORT) nebulizer solution  0.25 mg Nebulization BID   dextrose  50 mL Intravenous Once   fentaNYL (SUBLIMAZE) injection  25 mcg Intravenous Once   insulin aspart  0-9 Units Subcutaneous Q4H   mouth rinse  15 mL Mouth Rinse Q2H   pantoprazole (PROTONIX) IV  40 mg Intravenous Q24H   revefenacin  175 mcg Nebulization Daily   sodium chloride flush  10-40 mL Intracatheter Q12H   Continuous Infusions:  sodium chloride Stopped (01/12/22 1757)   amiodarone 30 mg/hr (01/15/22 0300)   fentaNYL infusion INTRAVENOUS Stopped (01/14/22 0829)   micafungin (MYCAMINE) 100 mg in sodium chloride 0.9 % 100 mL IVPB Stopped (01/14/22 1603)   norepinephrine (LEVOPHED) Adult infusion Stopped (01/14/22 2047)   piperacillin-tazobactam (ZOSYN)  IV 3.375 g (01/15/22 0524)   propofol (DIPRIVAN) infusion Stopped (01/14/22 0844)   TPN ADULT (ION) 63 mL/hr at 01/15/22 0300   vasopressin Stopped (01/28/2022 1955)   PRN Meds: sodium chloride, acetaminophen, fentaNYL, haloperidol lactate, levalbuterol, metoprolol tartrate, ondansetron (ZOFRAN) IV, mouth rinse, oxyCODONE, polyethylene glycol, sodium chloride flush, white petrolatum   Vital Signs    Vitals:   01/15/22 0530 01/15/22 0600 01/15/22 0630 01/15/22 0735  BP: 131/76 (!) 123/93    Pulse: 96 98 (!) 103   Resp: '20 19 19   '$ Temp:      TempSrc:      SpO2: 100% 100% 100% 100%  Weight:      Height:        Intake/Output Summary (Last 24 hours) at 01/15/2022 0746 Last data filed at 01/15/2022 0429 Gross per 24 hour  Intake 2363.95 ml  Output 8375 ml  Net -6011.05 ml       01/15/2022    4:33 AM 01/14/2022    5:00 AM 01/09/2022    5:00 AM  Last  3 Weights  Weight (lbs) 153 lb 14.1 oz 161 lb 2.5 oz 169 lb 12.1 oz  Weight (kg) 69.8 kg 73.1 kg 77 kg      Telemetry    Atrial fibrillation rate upper normal- Personally Reviewed   Physical Exam   GEN: Extubated; NAD Neck: supple Cardiac: irregular Respiratory: mild rhonchi GI: s/p abd surgery MS: trace edema Neuro:  Answers questions appropriately; moves all ext Psych: Normal affect  Labs    High Sensitivity Troponin:   Recent Labs  Lab 01/02/22 1023 01/02/22 1308 01/03/22 2119 01/04/22 0513 01/05/22 0502  TROPONINIHS 8 11 123* 131* 55*      Chemistry Recent Labs  Lab 01/12/22 0341 01/12/22 0454 02/04/2022 0524 01/14/22 0708 01/14/22 2037 01/15/22 0529  NA 132*   < > 132* 134* 138 138  K 3.9   < > 3.8 3.7 3.4* 3.7  CL 101  --  102 102 103 97*  CO2 23  --  '24 24 29 '$ 33*  GLUCOSE 255*  --  86 160* 144* 139*  BUN 20  --  '19 18 17 19  '$ CREATININE 0.61  --  0.57 0.70 0.71 0.58  CALCIUM 8.4*  --  8.6* 8.9 9.4 9.3  MG 1.8  --  1.9 2.1  --  1.5*  PROT 4.7*  --  4.5* 5.3*  --   --  ALBUMIN <1.5*  --  2.1* 2.3*  --   --   AST 32  --  24 33  --   --   ALT 47*  --  27 27  --   --   ALKPHOS 86  --  60 80  --   --   BILITOT 1.3*  --  1.2 1.2  --   --   GFRNONAA >60  --  >60 >60 >60 >60  ANIONGAP 8  --  '6 8 6 8   '$ < > = values in this interval not displayed.      Hematology Recent Labs  Lab 01/24/2022 1101 01/14/22 0708 01/14/22 0850  WBC 11.5* 18.5* 18.3*  RBC 2.45* 3.16* 2.87*  HGB 8.4* 11.0* 10.1*  HCT 25.1* 32.4* 30.2*  MCV 102.4* 102.5* 105.2*  MCH 34.3* 34.8* 35.2*  MCHC 33.5 34.0 33.4  RDW 17.9* 18.0* 17.9*  PLT 159 220 228      Patient Profile     81 y.o. female with a hx of atrial fib on propranolol 80 mg qd and Xarelto, HTN, asthma, breast CA, anxiety, who is being seen 01/02/2022 for the evaluation of CHF & rapid atrial fib at the request of Dr Marthenia Rolling.  Patient was admitted July 28 with probable pneumonia. Patient suffered a PEA arrest on  July 30 requiring 7 minutes of CPR.  Patient ultimately required chest tube placement for parapneumonic effusion.  Echocardiogram this admission shows ejection fraction 25 to 30%.  The mid/apical segments are akinetic and findings felt consistent with Takotsubo cardiomyopathy.  There was mild left atrial enlargement, moderate right atrial enlargement, moderate mitral regurgitation, mild to moderate tricuspid regurgitation, mild to moderate aortic insufficiency.  Taken to the OR August 7 and found to have ischemic portion of her cecum with contained perforation; had resection of right colon with ileostomy.  Assessment & Plan    1 permanent atrial fibrillation-heart rate remains controlled following extubation.  Continue IV amiodarone and transition to oral when able. Will continue.  Resume anticoagulation once okay from a surgical standpoint.    2 Takotsubo cardiomyopathy-vasopressors have been weaned to off.  Follow blood pressure and if stable begin guideline directed medical therapy in the next 24 to 48 hours including ARB or Entresto and beta-blockade.  Would plan follow-up echocardiogram in 1 to 2 weeks as she continues to improve.  Continue diuresis as tolerated.  If LV function does not improve would need ischemia evaluation but needs to make significant recovery from recent events.  3 status post resection of ischemic portion of cecum with contained perforation-much improved.  Followed by general surgery.  4 pneumonia/parapneumonic effusion-Per primary service.    5 valvular heart disease including moderate mitral regurgitation, mild to moderate aortic insufficiency-patient will need follow-up echoes in the future.  6 status post PEA arrest-this was felt secondary to respiratory distress.  For questions or updates, please contact [place] Please consult [email or web address] for contact info under        Signed, Kirk Ruths, MD  01/15/2022, 7:46 AM

## 2022-01-15 NOTE — Progress Notes (Signed)
° °  Inpatient Rehab Admissions Coordinator : ° °Per therapy change in recommendations, patient was screened for CIR candidacy by Takoda Janowiak RN MSN.  At this time patient appears to be a potential candidate for CIR. I will place a rehab consult per protocol for full assessment. Please call me with any questions. ° °Lamarr Feenstra RN MSN °Admissions Coordinator °[phone] °  °

## 2022-01-15 NOTE — Progress Notes (Signed)
NAME:  [name], MRN:  [id number], DOB:  Jun 16, 1940, LOS: 39 ADMISSION DATE:  12/27/2021, CONSULTATION DATE:  01/17/2022 REFERRING MD:  Bonner Puna, CHIEF COMPLAINT:  Hypotension, abdominal distention    History of Present Illness:  81 yo female admited for CAP causing respiratory failure, had a cardiac arrest after admission and was moved to the ICU.  Chest tube placed for parapneumonic effusion, extubated on 8/2. Sent out of ICU on 8/5. 8/6 developed ischemic colitis initially treated conservatively but this morning we were called back 8/7 for worsening shock, confusion.  She is going to the OR today.   Pertinent  Medical History  Breast ca on letrozole Afib on [place] Hospital Events: Including procedures, antibiotic start and stop dates in addition to other pertinent events   7/28 admit, Ceftriaxone/Azithro 7/29 last dose xeralto  7/30 IHCA x 7 min, Ceftriaxone>Cefepime 7/31 Chest tube placed, heparin gtt started 8/1 patient remove chest tube.  Head CT obtained >negative.  Severely agitated overnight.  Vancomycin stopped.  Azithromycin stopped. 8/6 Transferred to [facility] 8/7 Called CCM back for abd, Distention , hypotension and a fib with RVR, plan is for  urgent OR pending goals of care discussion then ICU post op if husband is in agreement. 8/10 Abdomen closed yesterday, SBT/SAT today 8/11 extubated yesterday, doing well on [place], up OOB, feeling alright  Interim History / Subjective:  Extubated and diuresed 8L yesterday, looks much improved today  Objective   Blood pressure (!) 128/96, pulse 92, temperature 98.3 F (36.8 C), temperature source Oral, resp. rate 19, height 5' (1.524 m), weight 69.8 kg, SpO2 100 %. CVP:  [1 mmHg-14 mmHg] 5 mmHg      Intake/Output Summary (Last 24 hours) at 01/15/2022 1213 Last data filed at 01/15/2022 1100 Gross per 24 hour  Intake 2749.65 ml  Output 8650 ml  Net -5900.35 ml    Filed Weights   01/23/2022 0500 01/14/22 0500  01/15/22 0433  Weight: 77 kg 73.1 kg 69.8 kg    General:  elderly F, looking improved, awake in chair and in no distress HEENT: MM pink/moist, sclera anicteric,  Neuro: awake, answering questions appropriately and following commands, moving all extremities  CV: s1s2, rrr, no m/r/g PULM:  good air movement bilaterally on [place] without rhonchi or wheezing GI: soft, bsx4 active  Extremities: warm/dry, no edema  Skin: no rashes or lesions    Significant labs Phos 2.1 Mag 1.5 K 3.7  Resolved Hospital Problem list   Septic shock Hypoxic Respiratory Failure  Assessment & Plan:   Septic shock Ischemic colitis with perforation. Possible PNA Possible component of cardiogenic shock:  co-ox initially on the low side. Known takotsubos cardiomyopathy. LVEF 25-30% - s/p ex-lap, colectomy 8/7, return to OR 8/9 for abdominal closure -POD 2 today and was extubated yesterday. Mobilizing, not passing flatus or stool yet - continue Zosyn, stop micafungin now that abdomen is closed - blood cultures negative -shock improved and off pressors -stable for transfer out of ICU today    Acute hypoxic respiratory failure due to right sided CAP with parapneumonic effusion, COPD +/- acute exacerbation  Attempt SAT/SBT with goal of extubation today Much improved Extubated Continue PT and mobilization - Neb triple therapy - Avoiding steroids, no evidence acute COPD exacerbation   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 Recovered Neurologically after arrest -ok to resume hepatin gtt per surgery - Amiodarone drip, transition to po when  able - Appreciate cardiology assistance  - Will eventually need follow up Echo -Lasix '80mg'$  x1 yesterday with 8L UOP, no further diuresis today  Hypokalemia Hypomag Possible re-feeding syndrome - Trend BMET - Replete  electrolytes   Nutrition  - continue TPN - NPO post abdominal  surgery   Best Practice (right click and "Reselect all SmartList Selections" daily)   Diet/type: NPO, TPN DVT prophylaxis: SCD, heparin gtt GI prophylaxis: H2B Lines: Central line [place] Foley:  remove foley today Code Status:  full code Last date of multidisciplinary goals of care discussion: patient and husband updated at the bedside 8/11  Labs   CBC: Recent Labs  Lab 01/10/22 1006 01/26/2022 0041 02/01/2022 1102 01/12/22 0341 01/12/22 0454 01/12/22 0650 01/21/2022 0524 01/29/2022 1101 01/14/22 0708 01/14/22 0850  WBC 33.2* 29.3*  --  23.6*  --   --  11.2* 11.5* 18.5* 18.3*  NEUTROABS 30.9* 25.8*  --  21.0*  --   --  9.7*  --  16.3*  --   HGB 13.3 12.1   < > 10.2*   < > 9.5* 6.9* 8.4* 11.0* 10.1*  HCT 39.2 37.0   < > 30.5*   < > 28.0* 20.9* 25.1* 32.4* 30.2*  MCV 106.8* 110.1*  --  108.2*  --   --  107.7* 102.4* 102.5* 105.2*  PLT 246 232  --  268  --   --  143* 159 220 228   < > = values in this interval not displayed.     Basic Metabolic Panel: Recent Labs  Lab 01/21/2022 1409 01/12/22 0341 01/12/22 0454 01/12/22 0650 01/24/2022 0524 01/14/22 0708 01/14/22 2037 01/15/22 0529  NA 136 132*   < > 132* 132* 134* 138 138  K 4.4 3.9   < > 3.6 3.8 3.7 3.4* 3.7  CL 102 101  --   --  102 102 103 97*  CO2 27 23  --   --  '24 24 29 '$ 33*  GLUCOSE 208* 255*  --   --  86 160* 144* 139*  BUN 17 20  --   --  '19 18 17 19  '$ CREATININE 0.59 0.61  --   --  0.57 0.70 0.71 0.58  CALCIUM 8.8* 8.4*  --   --  8.6* 8.9 9.4 9.3  MG 1.7 1.8  --   --  1.9 2.1  --  1.5*  PHOS  --  3.6  --   --  3.1 3.3  --  2.1*   < > = values in this interval not displayed.    GFR: Estimated Creatinine Clearance: 50.5 mL/min (by C-G formula based on SCr of 0.58 mg/dL). Recent Labs  Lab 01/10/22 1800 02/04/2022 0041 01/26/2022 2751 01/29/2022 1252 01/12/22 0341 01/24/2022 0524 01/16/2022 1101 01/14/22 0708 01/14/22 0850  PROCALCITON  --   --   --  0.38  --   --   --   --   --   WBC  --    < >  --   --  23.6*  11.2* 11.5* 18.5* 18.3*  LATICACIDVEN 1.6  --  2.6* 1.2 2.2*  --   --   --   --    < > = values in this interval not displayed.     Liver Function Tests: Recent Labs  Lab 01/21/2022 0041 01/12/2022 1409 01/12/22 0341 01/22/2022 0524 01/14/22 0708  AST 55* 38 32 24 33  ALT 88* 59* 47* 27 27  ALKPHOS 130* 101 86 60  80  BILITOT 2.4* 2.4* 1.3* 1.2 1.2  PROT 5.7* 4.5* 4.7* 4.5* 5.3*  ALBUMIN 1.9* <1.5* <1.5* 2.1* 2.3*    No results for input(s): "LIPASE", "AMYLASE" in the last 168 hours. No results for input(s): "AMMONIA" in the last 168 hours.  ABG    Component Value Date/Time   PHART 7.410 01/12/2022 0454   PCO2ART 41.3 01/12/2022 0454   PO2ART 173 (H) 01/12/2022 0454   HCO3 23.6 01/12/2022 0650   TCO2 25 01/12/2022 0650   ACIDBASEDEF 2.0 01/12/2022 0650   O2SAT 70.7 01/12/2022 0953     Coagulation Profile: No results for input(s): "INR", "PROTIME" in the last 168 hours.  Cardiac Enzymes: No results for input(s): "CKTOTAL", "CKMB", "CKMBINDEX", "TROPONINI" in the last 168 hours.  HbA1C: Hgb A1c MFr Bld  Date/Time Value Ref Range Status  01/03/2022 09:18 PM 4.9 4.8 - 5.6 % Final    Comment:    (NOTE) Pre diabetes:          5.7%-6.4%  Diabetes:              >6.4%  Glycemic control for   <7.0% adults with diabetes     CBG: Recent Labs  Lab 01/14/22 1552 01/14/22 2026 01/14/22 2349 01/15/22 0537 01/15/22 0824  GLUCAP 110* 154* 135* 149* 137*     Critical care time: n/a       Otilio Carpen Varonica Siharath, PA-C [facility] See Amion for pager If no response to pager , please call [phone] until 7pm After 7:00 pm call Elink  [phone]?[place]

## 2022-01-15 NOTE — Progress Notes (Signed)
Occupational Therapy Treatment Patient Details Name: [name] MRN: [id number] DOB: Jun 11, 1940 Today's Date: 01/15/2022   History of present illness 81 y.o. female admitted 12/06/2021 with CAP.  7/30 AMS, head CT negative for acute abnormality. Code blue 7/30, ROSC achieved after 7 min CPR, pt intubated. Chest tube insertion 7/31; pt removed CT 8/1. ETT 7/30-8/2. Continued AMS, hallucinations overnight 8/2; head CT 8/2 remains negative for acute abnormality. EEG 8/2 with no seizure activity. Improved cognition 8/5, but persistent tachycardia/afib with RVR. 8/6 ischemic colitis. 8/7 shock with intubation and to OR for ex lap. 8/9 abdomen closure. 8/10 extubation.  PMH includes afib, HTN, breast CA, melanoma, asthma, bilateral TKA, anxiety.   OT comments  Patient reassessed post intubation.  Received up in the recliner.  Deficits impacting independence are listed below.  Patient is needing up to Mod A for basic mobility and Max A for lower body ADL.  OT to continue efforts in the acute setting with AIR recommended for post acute prior to returning home.     Recommendations for follow up therapy are one component of a multi-disciplinary discharge planning process, led by the attending physician.  Recommendations may be updated based on patient status, additional functional criteria and insurance authorization.    Follow Up Recommendations  Acute inpatient rehab (3hours/day)    Assistance Recommended at Discharge Frequent or constant Supervision/Assistance  Patient can return home with the following  A lot of help with walking and/or transfers;A lot of help with bathing/dressing/bathroom;Assistance with cooking/housework;Assistance with feeding;Direct supervision/assist for medications management;Direct supervision/assist for financial management;Assist for transportation;Help with stairs or ramp for entrance   Equipment Recommendations  None recommended by OT    Recommendations for Other  Services Rehab consult    Precautions / Restrictions Precautions Precautions: Fall;Other (comment) Precaution Comments: watch HR Restrictions Weight Bearing Restrictions: No       Mobility Bed Mobility               General bed mobility comments: up in recliner    Transfers Overall transfer level: Needs assistance Equipment used: 1 person hand held assist Transfers: Sit to/from Stand Sit to Stand: Mod assist                 Balance Overall balance assessment: Needs assistance Sitting-balance support: Feet supported Sitting balance-Leahy Scale: Fair     Standing balance support: Bilateral upper extremity supported Standing balance-Leahy Scale: Poor                             ADL either performed or assessed with clinical judgement   ADL Overall ADL's : Needs assistance/impaired Eating/Feeding: NPO   Grooming: Wash/dry hands;Wash/dry face;Set up;Sitting   Upper Body Bathing: Minimal assistance;Sitting   Lower Body Bathing: Maximal assistance;Sitting/lateral leans   Upper Body Dressing : Moderate assistance;Sitting   Lower Body Dressing: Maximal assistance;Sit to/from stand   Toilet Transfer: Maximal assistance;Stand-pivot;BSC/3in1   Toileting- Clothing Manipulation and Hygiene: Maximal assistance;Sit to/from stand              Extremity/Trunk Assessment Upper Extremity Assessment Upper Extremity Assessment: Generalized weakness   Lower Extremity Assessment Lower Extremity Assessment: Defer to PT evaluation        Vision Patient Visual Report: No change from baseline     Perception Perception Perception: Not tested   Praxis Praxis Praxis: Not tested    Cognition Arousal/Alertness: Awake/alert Behavior During Therapy: WFL for tasks assessed/performed Overall Cognitive Status: Impaired/Different  from baseline                       Memory: Decreased short-term memory Following Commands: Follows one step  commands with increased time, Follows multi-step commands inconsistently Safety/Judgement: Decreased awareness of safety, Decreased awareness of deficits Awareness: Intellectual Problem Solving: Slow processing, Requires verbal cues                       General Comments  HR to 101 with mobility    Pertinent Vitals/ Pain       Pain Assessment Pain Assessment: No/denies pain Pain Intervention(s): Monitored during session                                                          Frequency  Min 2X/week        Progress Toward Goals  OT Goals(current goals can now be found in the care plan section)  Progress towards OT goals: Progressing toward goals  Acute Rehab OT Goals Time For Goal Achievement: 01/29/22 Potential to Achieve Goals: Good ADL Goals Pt Will Perform Lower Body Dressing: with min assist;sit to/from stand Pt Will Transfer to Toilet: with min guard assist;ambulating;regular height toilet Pt Will Perform Toileting - Clothing Manipulation and hygiene: with min guard assist;sitting/lateral leans  Plan Discharge plan needs to be updated    Co-evaluation                 AM-PAC OT "6 Clicks" Daily Activity     Outcome Measure   Help from another person eating meals?: Total Help from another person taking care of personal grooming?: A Little Help from another person toileting, which includes using toliet, bedpan, or urinal?: A Lot Help from another person bathing (including washing, rinsing, drying)?: A Lot Help from another person to put on and taking off regular upper body clothing?: A Lot Help from another person to put on and taking off regular lower body clothing?: A Lot 6 Click Score: 12    End of Session Equipment Utilized During Treatment: Oxygen  OT Visit Diagnosis: Other abnormalities of gait and mobility (R26.89);Other symptoms and signs involving cognitive function;Muscle weakness (generalized) (M62.81)    Activity Tolerance Patient tolerated treatment well   Patient Left in chair;with call bell/phone within reach;with chair alarm set   Nurse Communication Mobility status        Time: 1018-1040 OT Time Calculation (min): 22 min  Charges: OT General Charges $OT Visit: 1 Visit OT Evaluation $OT Eval Moderate Complexity: 1 Mod  01/15/2022  RP, OTR/L  Acute Rehabilitation Services  Office:  [phone]   Metta Clines 01/15/2022, 11:36 AM

## 2022-01-15 NOTE — Progress Notes (Signed)
[facility] Progress Note  2 Days Post-Op  Subjective: CC:  Extubated, sitting up in the chair this AM. Holding her abdomen abd has a wet cough. No bowel function yet.   Objective: Vital signs in last 24 hours: Temp:  [98.1 F (36.7 C)-99.4 F (37.4 C)] 98.3 F (36.8 C) (08/11 0800) Pulse Rate:  [90-130] 103 (08/11 0630) Resp:  [14-26] 19 (08/11 0630) BP: (110-163)/(53-99) 123/93 (08/11 0600) SpO2:  [89 %-100 %] 100 % (08/11 0735) Weight:  [69.8 kg] 69.8 kg (08/11 0433) Last BM Date : 01/06/22  Intake/Output from previous day: 08/10 0701 - 08/11 0700 In: 2711.1 [I.V.:1989.4; IV Piggyback:721.7] Out: 8375 [Urine:8375] Intake/Output this shift: No intake/output data recorded.  PE: Gen:  alert, sitting up in chair, NAD Card:  irregular, regular rate Pulm: slightly labored on nasal cannula, O2 sats 100% Abd: Soft, midline dressing w/ some SS strike-through GU: clear yellow urine in foley bag  Lab Results:  Recent Labs    01/14/22 0708 01/14/22 0850  WBC 18.5* 18.3*  HGB 11.0* 10.1*  HCT 32.4* 30.2*  PLT 220 228   BMET Recent Labs    01/14/22 2037 01/15/22 0529  NA 138 138  K 3.4* 3.7  CL 103 97*  CO2 29 33*  GLUCOSE 144* 139*  BUN 17 19  CREATININE 0.71 0.58  CALCIUM 9.4 9.3   PT/INR No results for input(s): "LABPROT", "INR" in the last 72 hours. CMP     Component Value Date/Time   NA 138 01/15/2022 0529   NA 139 11/18/2021 1432   NA 134 (L) 04/25/2017 1216   K 3.7 01/15/2022 0529   K 4.3 04/25/2017 1216   CL 97 (L) 01/15/2022 0529   CO2 33 (H) 01/15/2022 0529   CO2 29 04/25/2017 1216   GLUCOSE 139 (H) 01/15/2022 0529   GLUCOSE 85 04/25/2017 1216   BUN 19 01/15/2022 0529   BUN 10 11/18/2021 1432   BUN 14.4 04/25/2017 1216   CREATININE 0.58 01/15/2022 0529   CREATININE 0.72 01/17/2018 1347   CREATININE 0.7 04/25/2017 1216   CALCIUM 9.3 01/15/2022 0529   CALCIUM 10.0 04/25/2017 1216   PROT 5.3 (L) 01/14/2022 0708   PROT 7.2  04/25/2017 1216   ALBUMIN 2.3 (L) 01/14/2022 0708   ALBUMIN 3.7 04/25/2017 1216   AST 33 01/14/2022 0708   AST 20 01/17/2018 1347   AST 21 04/25/2017 1216   ALT 27 01/14/2022 0708   ALT 10 01/17/2018 1347   ALT 10 04/25/2017 1216   ALKPHOS 80 01/14/2022 0708   ALKPHOS 75 04/25/2017 1216   BILITOT 1.2 01/14/2022 0708   BILITOT 0.5 01/17/2018 1347   BILITOT 0.65 04/25/2017 1216   GFRNONAA >60 01/15/2022 0529   GFRNONAA >60 01/17/2018 1347   GFRAA 90 04/24/2020 1235   GFRAA >60 01/17/2018 1347   Lipase     Component Value Date/Time   LIPASE 77 07/28/2008 1630       Studies/Results: No results found.  Anti-infectives: Anti-infectives (From admission, onward)    Start     Dose/Rate Route Frequency Ordered Stop   01/14/2022 1400  micafungin (MYCAMINE) 100 mg in sodium chloride 0.9 % 100 mL IVPB        100 mg 105 mL/hr over 1 Hours Intravenous Every 24 hours 01/21/2022 1302 01/30/2022 2359   01/10/22 1515  piperacillin-tazobactam (ZOSYN) IVPB 3.375 g        3.375 g 12.5 mL/hr over 240 Minutes Intravenous Every 8 hours 01/10/22 1427 01/17/2022  2359   01/04/22 2000  vancomycin (VANCOCIN) IVPB 1000 mg/200 mL premix  Status:  Discontinued        1,000 mg 200 mL/hr over 60 Minutes Intravenous Every 24 hours 01/03/22 1910 01/05/22 1321   01/03/22 2200  ceFEPIme (MAXIPIME) 2 g in sodium chloride 0.9 % 100 mL IVPB        2 g 200 mL/hr over 30 Minutes Intravenous Every 12 hours 01/03/22 1910 01/08/22 2209   01/03/22 1930  vancomycin (VANCOREADY) IVPB 1250 mg/250 mL        1,250 mg 166.7 mL/hr over 90 Minutes Intravenous  Once 01/03/22 1910 01/03/22 2219   01/02/22 2100  azithromycin (ZITHROMAX) 500 mg in sodium chloride 0.9 % 250 mL IVPB        500 mg 250 mL/hr  Intravenous Every 24 hours 12/15/2021 2248 01/06/22 2149   01/02/22 2000  cefTRIAXone (ROCEPHIN) 2 g in sodium chloride 0.9 % 100 mL IVPB  Status:  Discontinued        2 g 200 mL/hr over 30 Minutes Intravenous Every 24 hours  12/08/2021 2248 01/03/22 1733   01/02/2022 2030  cefTRIAXone (ROCEPHIN) 1 g in sodium chloride 0.9 % 100 mL IVPB        1 g 200 mL/hr over 30 Minutes Intravenous  Once 12/07/2021 2025 12/23/2021 2141   12/25/2021 2030  azithromycin (ZITHROMAX) 500 mg in sodium chloride 0.9 % 250 mL IVPB        500 mg 250 mL/hr over 60 Minutes Intravenous  Once 12/26/2021 2025 12/24/2021 2312        Assessment/Plan ischemic bowel with perforation S/p exploratory laparotomy,  R colectomy, abthera placement 01/24/2022 Dr. Rosendo Gros S/p exploratory laparotomy, intestinal anastomosis, abdominal closure 02/04/2022 Dr. Rosendo Gros -  POD 4, POD 2  - afebrile VSS  - hgb stable, WBC stable - continue TNA and await bowel function  - wet-to-dry dressing to midline BID.   FEN: PICC/TPN, ok for meds per tube  ID: Zosyn; tentatively plan for 5d post-op (stop date 8/14 at 2359) VTE: SCD's, ok for hep gtt  Foley: in place Dispo: ICU    Below per CCM -- Septic shock due to above Cardiac arrest 7/30, 7 mins CPR Hx cardiomyopathy COPD Afib, permanent   LOS: 14 days   I reviewed nursing notes, hospitalist notes, last 24 h vitals and pain scores, last 48 h intake and output, last 24 h labs and trends, and last 24 h imaging results.   Obie Dredge, PA-C [facility] Please see Amion for pager number during day hours 7:00am-4:30pm

## 2022-01-16 DIAGNOSIS — J9601 Acute respiratory failure with hypoxia: Secondary | ICD-10-CM | POA: Diagnosis not present

## 2022-01-16 DIAGNOSIS — J189 Pneumonia, unspecified organism: Secondary | ICD-10-CM | POA: Diagnosis not present

## 2022-01-16 DIAGNOSIS — A419 Sepsis, unspecified organism: Secondary | ICD-10-CM

## 2022-01-16 DIAGNOSIS — I4821 Permanent atrial fibrillation: Secondary | ICD-10-CM

## 2022-01-16 DIAGNOSIS — I469 Cardiac arrest, cause unspecified: Secondary | ICD-10-CM | POA: Diagnosis not present

## 2022-01-16 DIAGNOSIS — K559 Vascular disorder of intestine, unspecified: Secondary | ICD-10-CM | POA: Diagnosis not present

## 2022-01-16 DIAGNOSIS — I5181 Takotsubo syndrome: Secondary | ICD-10-CM | POA: Diagnosis not present

## 2022-01-16 DIAGNOSIS — R6521 Severe sepsis with septic shock: Secondary | ICD-10-CM

## 2022-01-16 LAB — BASIC METABOLIC PANEL
Anion gap: 9 (ref 5–15)
BUN: 18 mg/dL (ref 8–23)
CO2: 33 mmol/L — ABNORMAL HIGH (ref 22–32)
Calcium: 8.7 mg/dL — ABNORMAL LOW (ref 8.9–10.3)
Chloride: 95 mmol/L — ABNORMAL LOW (ref 98–111)
Creatinine, Ser: 0.54 mg/dL (ref 0.44–1.00)
GFR, Estimated: >60 mL/min (ref >60)
Glucose, Bld: 156 mg/dL — ABNORMAL HIGH (ref 70–99)
Potassium: 4.1 mmol/L (ref 3.5–5.1)
Sodium: 137 mmol/L (ref 135–145)

## 2022-01-16 LAB — APTT: aPTT: 89 seconds — ABNORMAL HIGH (ref 24–36)

## 2022-01-16 LAB — COMPREHENSIVE METABOLIC PANEL
ALT: 23 U/L (ref 0–44)
AST: 39 U/L (ref 15–41)
Albumin: 2.8 g/dL — ABNORMAL LOW (ref 3.5–5.0)
Alkaline Phosphatase: 73 U/L (ref 38–126)
Anion gap: 7 (ref 5–15)
BUN: 18 mg/dL (ref 8–23)
CO2: 32 mmol/L (ref 22–32)
Calcium: 8.6 mg/dL — ABNORMAL LOW (ref 8.9–10.3)
Chloride: 98 mmol/L (ref 98–111)
Creatinine, Ser: 0.53 mg/dL (ref 0.44–1.00)
GFR, Estimated: >60 mL/min (ref >60)
Glucose, Bld: 224 mg/dL — ABNORMAL HIGH (ref 70–99)
Potassium: 3.3 mmol/L — ABNORMAL LOW (ref 3.5–5.1)
Sodium: 137 mmol/L (ref 135–145)
Total Bilirubin: 1.1 mg/dL (ref 0.3–1.2)
Total Protein: 5.2 g/dL — ABNORMAL LOW (ref 6.5–8.1)

## 2022-01-16 LAB — GLUCOSE, CAPILLARY
Glucose-Capillary: 113 mg/dL — ABNORMAL HIGH (ref 70–99)
Glucose-Capillary: 115 mg/dL — ABNORMAL HIGH (ref 70–99)
Glucose-Capillary: 119 mg/dL — ABNORMAL HIGH (ref 70–99)
Glucose-Capillary: 122 mg/dL — ABNORMAL HIGH (ref 70–99)
Glucose-Capillary: 123 mg/dL — ABNORMAL HIGH (ref 70–99)
Glucose-Capillary: 147 mg/dL — ABNORMAL HIGH (ref 70–99)
Glucose-Capillary: 153 mg/dL — ABNORMAL HIGH (ref 70–99)

## 2022-01-16 LAB — HEPARIN LEVEL (UNFRACTIONATED)
Heparin Unfractionated: 0.15 IU/mL — ABNORMAL LOW (ref 0.30–0.70)
Heparin Unfractionated: 0.15 IU/mL — ABNORMAL LOW (ref 0.30–0.70)
Heparin Unfractionated: <0.1 IU/mL — ABNORMAL LOW (ref 0.30–0.70)
Heparin Unfractionated: <0.1 IU/mL — ABNORMAL LOW (ref 0.30–0.70)

## 2022-01-16 LAB — PHOSPHORUS: Phosphorus: 3.3 mg/dL (ref 2.5–4.6)

## 2022-01-16 LAB — MAGNESIUM: Magnesium: 1.7 mg/dL (ref 1.7–2.4)

## 2022-01-16 MED ORDER — POTASSIUM CHLORIDE 10 MEQ/50ML IV SOLN
10.0000 meq | INTRAVENOUS | Status: AC
  Administered 2022-01-16 (×4): 10 meq via INTRAVENOUS
  Filled 2022-01-16 (×2): qty 50

## 2022-01-16 MED ORDER — POTASSIUM CHLORIDE 10 MEQ/50ML IV SOLN
10.0000 meq | INTRAVENOUS | Status: AC
  Administered 2022-01-16 (×2): 10 meq via INTRAVENOUS
  Filled 2022-01-16: qty 50

## 2022-01-16 MED ORDER — TRAVASOL 10 % IV SOLN
INTRAVENOUS | Status: AC
  Filled 2022-01-16: qty 889.2

## 2022-01-16 MED ORDER — ORAL CARE MOUTH RINSE
15.0000 mL | OROMUCOSAL | Status: DC
  Administered 2022-01-16 – 2022-01-19 (×5): 15 mL via OROMUCOSAL

## 2022-01-16 MED ORDER — FUROSEMIDE 10 MG/ML IJ SOLN
40.0000 mg | Freq: Once | INTRAMUSCULAR | Status: AC
  Administered 2022-01-16: 40 mg via INTRAVENOUS
  Filled 2022-01-16: qty 4

## 2022-01-16 MED ORDER — MAGNESIUM SULFATE 4 GM/100ML IV SOLN
4.0000 g | Freq: Once | INTRAVENOUS | Status: AC
  Administered 2022-01-16: 4 g via INTRAVENOUS
  Filled 2022-01-16: qty 100

## 2022-01-16 MED ORDER — ORAL CARE MOUTH RINSE
15.0000 mL | OROMUCOSAL | Status: DC | PRN
Start: 2022-01-16 — End: 2022-01-19

## 2022-01-16 MED ORDER — FUROSEMIDE 10 MG/ML IJ SOLN: 40.0000 mg | Freq: Once | INTRAMUSCULAR | Status: DC

## 2022-01-16 MED ORDER — FENTANYL CITRATE PF 50 MCG/ML IJ SOSY
12.5000 ug | PREFILLED_SYRINGE | INTRAMUSCULAR | Status: DC | PRN
  Administered 2022-01-16 – 2022-01-22 (×11): 12.5 ug via INTRAVENOUS
  Filled 2022-01-16 (×12): qty 1

## 2022-01-16 NOTE — Progress Notes (Signed)
SLP Cancellation Note  Patient Details Name: [name] MRN: [id number] DOB: 13-Jul-1940   Cancelled treatment:       Reason Eval/Treat Not Completed: Other (comment) (Patient continues with no bowel function. SLP will follow for readiness)   Sonia Baller, MA, CCC-SLP Speech Therapy

## 2022-01-16 NOTE — Progress Notes (Addendum)
PHARMACY - TOTAL PARENTERAL NUTRITION CONSULT NOTE  Indication: Bowel perf s/p repair with open abdomen  Patient Measurements: Height: 5' (152.4 cm) Weight: 69.8 kg (153 lb 14.1 oz) IBW/kg (Calculated) : 45.5 TPN AdjBW (KG): 51 Body mass index is 30.05 kg/m.  Assessment:  86 YOF admited for CAP causing respiratory failure, had a cardiac arrest after admission and was moved to the ICU.  Chest tube placed for parapneumonic effusion, extubated on 8/2. Sent out of ICU on 8/5. On 8/6 she developed ischemic colitis, initially treated conservatively but transferred back to ICU for worsening shock and confusion 8/7.  Now s/p ex lap, R colectomy for ischemic bowel with perforation. Pharmacy consulted to manage TPN.  Glucose / Insulin: A1c 4.9% - CBGs well controlled.  SSI D/C'ed 01/15/22. Electrolytes: K 3.3 and Phos 3.3 post KPhos 67mol (goal K >/= 4), Mag 1.7 post 2gm (goal >/= 2), others WNL (CO2 high normal).  ?potential contamination given CBG high on AM labs. Renal: SCr < 1, BUN WNL Hepatic: LFTs / tbili / TG WNL, albumin 2.8 Intake / Output; MIVF: UOP 0.5 ml/kg/hr GI Imaging: 8/6 KUB: Mild gaseous distension favors ileus  8/6 CT: concerning for iscehmic colitis; possible SBO and/or inflammatory ileus  8/7 KUB: diffuse gaseous small bowel and colonic distension GI Surgeries / Procedures:  8/7 Ex lap, colon resection 8/9 Ex lap, intestinal anastomosis, wound closure  Central access: PICC placed 01/12/22 TPN start date:  01/20/2022  Nutritional Goals: RD Estimated Needs Total Energy Estimated Needs: 1500-1700 kcals Total Protein Estimated Needs: 85-100 g Total Fluid Estimated Needs: >/= 1.5 L  Current Nutrition:  TPN   Plan:  Continue TPN at goal rate of 65 ml/hr to provide 89g AA and 1540 kCal, meeting 100% of needs Electrolytes in TPN: Na increased to 75 mEq/L on 8/11, increase K to 560m/L (= 86 mEq/day), Ca 53m75mL, Mg 10 mEq/L, Phos 20 mmol/L, Cl:Ac 2:1 Add standard MVI and trace  elements to TPN  Check CBG Q12H - consider stopping in AM if stable Mag sulfate 4gm IV x 1 KCL x 4 runs already ordered.  Give an additional 2 runs for a total of 6 runs today. Standard TPN labs on Mon/Thurs, labs in AM F/u diuresis plan   Campbell Kray D. DanMina MarbleharmD, BCPS, BCCTooele12/2023, 7:26 AM

## 2022-01-16 NOTE — Progress Notes (Addendum)
PROGRESS NOTE    [name]  ZDG:[id number] DOB: June 23, 1940 DOA: 12/07/2021 PCP: [facility] [place]   Brief Narrative:  This is a 81 year old female admitted for community-acquired pneumonia and respiratory failure and concurrent cardiac arrest.  Patient had a parapneumonic effusion requiring chest tube.  Developed ischemic colitis was taken to the OR for a laparotomy with worsening shock/confusion. Patient was extubated on 01/14/2022 and now doing well.  7/28 admit, Ceftriaxone/Azithro 7/29 last dose xeralto  7/30 IHCA x 7 min, Ceftriaxone>Cefepime 7/31 Chest tube placed, heparin gtt started 8/1 patient remove chest tube.  Head CT obtained >negative.  Severely agitated overnight.  Vancomycin stopped.  Azithromycin stopped. 8/6 Transferred to [facility] 8/7 Called CCM back for abd, Distention , hypotension and a fib with RVR, plan is for  urgent OR pending goals of care discussion then ICU post op if husband is in agreement. 8/10 Abdomen closed yesterday, SBT/SAT today 8/11 extubated yesterday, doing well on [place], up OOB, feeling alright   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   Hypotension   A-fib (HCC)   Lactic acid increased   Cardiac arrest ([place])   Acute respiratory failure with hypoxia ([place])   Pneumonia of right lower lobe due to infectious organism   Takotsubo cardiomyopathy   Ischemic colitis ([place])   Septic shock ([place])   Septic shock Ischemic colitis with perforation.  Concurrent CAP with parapneumonic effusion Questionable concurrent cardiogenic shock:  -Known history of Takotsubo cardiomyopathy -EF 25 to 30% -Status post ex lap colectomy 01/17/2022 return on 01/21/2022 for abdominal closure -No flatus or stool at this time, continue medical management per surgery -Continue TPN until definitive p.o. route can be established -Continue Zosyn -  blood cultures negative -Shock resolved off pressors  Acute hypoxic respiratory failure due to right sided CAP with parapneumonic effusion, COPD without acute exacerbation  Attempt SAT/SBT with goal of extubation today -Extubated 01/14/2022  -Continue PT OT mobilization, flutter, incentive spirometry  - Neb triple therapy -COPD not in acute exacerbation, no indication for steroids at this time    SP PEA arrest 2/2 respiratory distress/ hypoxia Atrial fibrillation with RVR Takotsubo cardiomyopathy EF 25-30% on 7/31 -Recovered -likely back to baseline mental status -Heparin drip amiodarone drip ongoing - Appreciate cardiology assistance  - Will eventually need follow up Echo - Lasix ongoing per cardiology as needed, diuresing quite well   Moderate protein caloric malnutrition  Hypokalemia Hypomagnesemia Possible re-feeding syndrome -Complicated by n.p.o. status postoperatively -Follow clinically, labs improving -Electrolytes currently being managed by TPN   DVT prophylaxis: Heparin drip Code Status: Full Family Communication: None present  Status is: Inpatient  Dispo: The patient is from: Home              Anticipated d/c is to: To be determined              Anticipated d/c date is: 72+ hours              Patient currently not medically stable for discharge  Consultants:  General surgery, PCCM, cardiology  Antimicrobials:  Zosyn ongoing -currently planned through 01/22/2022 Micafungin discontinued Previously completed short course of azithromycin and cefepime.  Subjective: No acute issues or events overnight denies nausea vomiting diarrhea constipation headache fevers chills or chest pain  Objective: Vitals:   01/16/22 0800 01/16/22 0900 01/16/22 1000 01/16/22 1200  BP: (!) 153/92 (!) 152/71 Marland Kitchen)  127/50 (!) 168/41  Pulse: 85 86 97 98  Resp: (!) 21 (!) 21 (!) 24 (!) 32  Temp:    98.6 F (37 C)  TempSrc:    Oral  SpO2: 96% 100% 100% 96%  Weight:      Height:         Intake/Output Summary (Last 24 hours) at 01/16/2022 1309 Last data filed at 01/16/2022 1000 Gross per 24 hour  Intake 2421.98 ml  Output 200 ml  Net 2221.98 ml   Filed Weights   01/17/2022 0500 01/14/22 0500 01/15/22 0433  Weight: 77 kg 73.1 kg 69.8 kg    Examination:  General:  Pleasantly resting in bed, No acute distress. HEENT:  Normocephalic atraumatic.  Sclerae nonicteric, noninjected.  Extraocular movements intact bilaterally. Neck:  Without mass or deformity.  Trachea is midline. Lungs: Scant end expiratory wheeze right lung field diffusely, left lung field without overt wheeze rales or rhonchi Heart:  Regular rate and rhythm.  Without murmurs, rubs, or gallops. Abdomen: Large abdominal bandage clean dry intact. Extremities/GU: Pure wick in place, limbs without cyanosis, clubbing, 2+ pitting edema bilateral lower extremities. Vascular:  Dorsalis pedis and posterior tibial pulses palpable bilaterally. Skin:  Warm and dry, no erythema  Data Reviewed: I have personally reviewed following labs and imaging studies  CBC: Recent Labs  Lab 01/10/22 1006 01/07/2022 0041 01/31/2022 1102 01/12/22 0341 01/12/22 0454 01/12/22 0650 01/26/2022 0524 01/23/2022 1101 01/14/22 0708 01/14/22 0850  WBC 33.2* 29.3*  --  23.6*  --   --  11.2* 11.5* 18.5* 18.3*  NEUTROABS 30.9* 25.8*  --  21.0*  --   --  9.7*  --  16.3*  --   HGB 13.3 12.1   < > 10.2*   < > 9.5* 6.9* 8.4* 11.0* 10.1*  HCT 39.2 37.0   < > 30.5*   < > 28.0* 20.9* 25.1* 32.4* 30.2*  MCV 106.8* 110.1*  --  108.2*  --   --  107.7* 102.4* 102.5* 105.2*  PLT 246 232  --  268  --   --  143* 159 220 228   < > = values in this interval not displayed.   Basic Metabolic Panel: Recent Labs  Lab 01/12/22 0341 01/12/22 0454 01/10/2022 0524 01/14/22 0708 01/14/22 2037 01/15/22 0529 01/16/22 0536  NA 132*   < > 132* 134* 138 138 137  K 3.9   < > 3.8 3.7 3.4* 3.7 3.3*  CL 101  --  102 102 103 97* 98  CO2 23  --  '24 24 29 '$ 33* 32   GLUCOSE 255*  --  86 160* 144* 139* 224*  BUN 20  --  '19 18 17 19 18  '$ CREATININE 0.61  --  0.57 0.70 0.71 0.58 0.53  CALCIUM 8.4*  --  8.6* 8.9 9.4 9.3 8.6*  MG 1.8  --  1.9 2.1  --  1.5* 1.7  PHOS 3.6  --  3.1 3.3  --  2.1* 3.3   < > = values in this interval not displayed.   GFR: Estimated Creatinine Clearance: 50.5 mL/min (by C-G formula based on SCr of 0.53 mg/dL). Liver Function Tests: Recent Labs  Lab 01/17/2022 1409 01/12/22 0341 02/04/2022 0524 01/14/22 0708 01/16/22 0536  AST 38 32 24 33 39  ALT 59* 47* '27 27 23  '$ ALKPHOS 101 86 60 80 73  BILITOT 2.4* 1.3* 1.2 1.2 1.1  PROT 4.5* 4.7* 4.5* 5.3* 5.2*  ALBUMIN <1.5* <1.5* 2.1* 2.3* 2.8*  No results for input(s): "LIPASE", "AMYLASE" in the last 168 hours. No results for input(s): "AMMONIA" in the last 168 hours. Coagulation Profile: No results for input(s): "INR", "PROTIME" in the last 168 hours. Cardiac Enzymes: No results for input(s): "CKTOTAL", "CKMB", "CKMBINDEX", "TROPONINI" in the last 168 hours. BNP (last 3 results) No results for input(s): "PROBNP" in the last 8760 hours. HbA1C: No results for input(s): "HGBA1C" in the last 72 hours. CBG: Recent Labs  Lab 01/15/22 2008 01/16/22 0027 01/16/22 0406 01/16/22 0805 01/16/22 1159  GLUCAP 112* 123* 147* 122* 153*   Lipid Profile: Recent Labs    01/14/22 0708 01/15/22 0529  TRIG 127 105   Thyroid Function Tests: No results for input(s): "TSH", "T4TOTAL", "FREET4", "T3FREE", "THYROIDAB" in the last 72 hours. Anemia Panel: No results for input(s): "VITAMINB12", "FOLATE", "FERRITIN", "TIBC", "IRON", "RETICCTPCT" in the last 72 hours. Sepsis Labs: Recent Labs  Lab 01/10/22 1800 01/26/2022 0608 01/31/2022 1252 01/12/22 0341  PROCALCITON  --   --  0.38  --   LATICACIDVEN 1.6 2.6* 1.2 2.2*    No results found for this or any previous visit (from the past 240 hour(s)).       Radiology Studies: No results found.      Scheduled Meds:  sodium  chloride   Intravenous Once   arformoterol  15 mcg Nebulization BID   budesonide (PULMICORT) nebulizer solution  0.25 mg Nebulization BID   dextrose  50 mL Intravenous Once   furosemide  40 mg Intravenous Once   mouth rinse  15 mL Mouth Rinse Q2H   mouth rinse  15 mL Mouth Rinse 4 times per day   pantoprazole (PROTONIX) IV  40 mg Intravenous Q24H   revefenacin  175 mcg Nebulization Daily   Continuous Infusions:  sodium chloride Stopped (01/12/22 1757)   amiodarone 30 mg/hr (01/16/22 1000)   heparin 1,050 Units/hr (01/16/22 1056)   piperacillin-tazobactam (ZOSYN)  IV 12.5 mL/hr at 01/16/22 1000   potassium chloride     TPN ADULT (ION) 63 mL/hr at 01/16/22 1000   TPN ADULT (ION)      LOS: 15 days   Time spent: 32mn  Gregorey Nabor C Catelynn Sparger, DO [facility]  If 7PM-7AM, please contact night-coverage [email or web address]  01/16/2022, 1:09 PM

## 2022-01-16 NOTE — Progress Notes (Signed)
[place] for Heparin Indication: atrial fibrillation  Allergies  Allergen Reactions   Chlorhexidine Gluconate Hives, Swelling and Rash       "ChloraPrep "   Doxycycline Nausea And Vomiting   Codeine Nausea Only and Other (See Comments)    Pt can tolerate when pre-medicated   Doxycycline Nausea And Vomiting   Nsaids     avoid due to gerd   Nsaids Other (See Comments)    Gi bleed   Tylenol [Acetaminophen]     Avoid due to GERD   Adhesive [Tape] Rash    Dermabond   Chlorhexidine Swelling and Rash   Codeine Nausea Only   Tylenol [Acetaminophen] Other (See Comments)    GI bleed    Patient Measurements: Height: 5' (152.4 cm) Weight: 69.8 kg (153 lb 14.1 oz) IBW/kg (Calculated) : 45.5 Heparin Dosing Weight: 60 kg   Vital Signs: Temp: 98.9 F (37.2 C) (08/12 1900) Temp Source: Oral (08/12 1900) BP: 95/71 (08/12 2000) Pulse Rate: 97 (08/12 2052)  Labs: Recent Labs    01/14/22 0708 01/14/22 0850 01/14/22 2037 01/15/22 0529 01/15/22 2349 01/15/22 2349 01/16/22 0426 01/16/22 0536 01/16/22 0809 01/16/22 1854  HGB 11.0* 10.1*  --   --   --   --   --   --   --   --   HCT 32.4* 30.2*  --   --   --   --   --   --   --   --   PLT 220 228  --   --   --   --   --   --   --   --   APTT 26  --   --  32 89*  --   --   --   --   --   HEPARINUNFRC  --   --   --   --  0.15*   < > <0.10*  --  <0.10* 0.15*  CREATININE 0.70  --    < > 0.58  --   --   --  0.53  --  0.54   < > = values in this interval not displayed.     Estimated Creatinine Clearance: 50.5 mL/min (by C-G formula based on SCr of 0.54 mg/dL).   Medical History: Past Medical History:  Diagnosis Date   Anemia    history of   Anxiety    Arthritis    Asthma    Breast cancer ([place])    Cancer ([place])    skin cancers, one melanoma    Complication of anesthesia    patient woke up during colonoscopy   Concussion    8 yrs. ago due to being thrown from a horse   Dislocated  elbow 10/12/2016   left   Family history of breast cancer    GERD (gastroesophageal reflux disease)    History of kidney stones    History of radiation therapy 03/23/17-05/04/17   right chest wall 50.4 Gy in 28 fractions, right axilla 45 Gy in 25 fractions   Hypertension    Hypertensive heart disease without CHF    Keratosis, actinic    Melanoma ([place])    Persistent atrial fibrillation ([place]) 12/16/2016   CHA2DS2VASC score 3   Pneumonia    hx of     Medications:  Scheduled:   sodium chloride   Intravenous Once   arformoterol  15 mcg Nebulization BID   budesonide (PULMICORT) nebulizer solution  0.25 mg  Nebulization BID   dextrose  50 mL Intravenous Once   mouth rinse  15 mL Mouth Rinse Q2H   mouth rinse  15 mL Mouth Rinse 4 times per day   pantoprazole (PROTONIX) IV  40 mg Intravenous Q24H   revefenacin  175 mcg Nebulization Daily    Assessment: 45 yof presenting with acute hypoxemia related to COPD excerbation/PNA complicated by PEA arrest  - on Xarelto PTA for Afib (LD 7/29).   Pharmacy is consulted to resume heparin for atrial fibrillation given patient is stable s/p abdominal closure on 8/10.   Heparin level 0.15 on heparin drip rate 1050 units/hr. No s/sx of bleeding or infusion issues. Heparin running in PICC, level being drawn from IJ Introducer - had tried previously 3 times for PIV access but unsuccessful   Goal of Therapy:  Heparin level 0.3-0.7 units/ml aPTT 66-102 seconds Monitor platelets by anticoagulation protocol: Yes   Plan:  Increase heparin infusion to 1200 units/hr Plan to change to DOAC once have enteral access Monitor CBC, and for s/sx of bleeding     Bonnita Nasuti Pharm.D. CPP, BCPS Clinical Pharmacist [phone] 01/16/2022 9:13 PM   Please check AMION for all [place] phone numbers After 10:00 PM, call [place] [phone]

## 2022-01-16 NOTE — Progress Notes (Signed)
Transferred pt to 4E18 on Heparin, [place], TPN drip infusing.On 2 L [place].Sats >95% on the monitor.Alert. Report given to CN.

## 2022-01-16 NOTE — Progress Notes (Signed)
Inpatient Rehab Admissions:  Inpatient Rehab Consult received.  I met with patient and her husband [name] at the bedside for rehabilitation assessment and to discuss goals and expectations of an inpatient rehab admission.  Discussed average length of stay at CIR. Discussed 24/7 support requirement after discharge. [name] confirmed that he will be able to provide 24/7 support for pt after discharge. Discussed pre-authorization requirement of pt's insurance. Also discussed that pt is not medically ready for CIR d/t being NPO and on TPN. Both pt and pt's husband acknowledged understanding. Will continue to follow.  Signed: Gayland Curry, [place], [place] Admissions Coordinator [phone]

## 2022-01-16 NOTE — Progress Notes (Addendum)
ANTICOAGULATION CONSULT NOTE   Pharmacy Consult for Heparin Indication: atrial fibrillation  Allergies  Allergen Reactions   Chlorhexidine Gluconate Hives, Swelling and Rash       "ChloraPrep "   Doxycycline Nausea And Vomiting   Codeine Nausea Only and Other (See Comments)    Pt can tolerate when pre-medicated   Doxycycline Nausea And Vomiting   Nsaids     avoid due to gerd   Nsaids Other (See Comments)    Gi bleed   Tylenol [Acetaminophen]     Avoid due to GERD   Adhesive [Tape] Rash    Dermabond   Chlorhexidine Swelling and Rash   Codeine Nausea Only   Tylenol [Acetaminophen] Other (See Comments)    GI bleed    Patient Measurements: Height: 5' (152.4 cm) Weight: 69.8 kg (153 lb 14.1 oz) IBW/kg (Calculated) : 45.5 Heparin Dosing Weight: 60 kg   Vital Signs: Temp: 98.8 F (37.1 C) (08/12 0300) Temp Source: Oral (08/12 0300) BP: 191/67 (08/12 0400) Pulse Rate: 103 (08/12 0400)  Labs: Recent Labs    01/26/2022 1101 01/15/2022 1101 01/14/22 0708 01/14/22 0850 01/14/22 2037 01/15/22 0529 01/15/22 2349 01/16/22 0426 01/16/22 0536  HGB 8.4*  --  11.0* 10.1*  --   --   --   --   --   HCT 25.1*  --  32.4* 30.2*  --   --   --   --   --   PLT 159  --  220 228  --   --   --   --   --   APTT  --   --  26  --   --  32 89*  --   --   HEPARINUNFRC  --   --   --   --   --   --  0.15* <0.10*  --   CREATININE  --    < > 0.70  --  0.71 0.58  --   --  0.53   < > = values in this interval not displayed.     Estimated Creatinine Clearance: 50.5 mL/min (by C-G formula based on SCr of 0.53 mg/dL).   Medical History: Past Medical History:  Diagnosis Date   Anemia    history of   Anxiety    Arthritis    Asthma    Breast cancer ([place])    Cancer ([place])    skin cancers, one melanoma    Complication of anesthesia    patient woke up during colonoscopy   Concussion    8 yrs. ago due to being thrown from a horse   Dislocated elbow 10/12/2016   left   Family history of  breast cancer    GERD (gastroesophageal reflux disease)    History of kidney stones    History of radiation therapy 03/23/17-05/04/17   right chest wall 50.4 Gy in 28 fractions, right axilla 45 Gy in 25 fractions   Hypertension    Hypertensive heart disease without CHF    Keratosis, actinic    Melanoma ([place])    Persistent atrial fibrillation ([place]) 12/16/2016   CHA2DS2VASC score 3   Pneumonia    hx of     Medications:  Scheduled:   sodium chloride   Intravenous Once   arformoterol  15 mcg Nebulization BID   budesonide (PULMICORT) nebulizer solution  0.25 mg Nebulization BID   dextrose  50 mL Intravenous Once   fentaNYL (SUBLIMAZE) injection  25 mcg Intravenous Once   mouth  rinse  15 mL Mouth Rinse Q2H   pantoprazole (PROTONIX) IV  40 mg Intravenous Q24H   revefenacin  175 mcg Nebulization Daily    Assessment: 28 yof presenting with acute hypoxemia related to COPD excerbation/PNA complicated by PEA arrest  - on Xarelto PTA for Afib (LD 7/29).   Pharmacy is consulted to resume heparin for atrial fibrillation given patient is stable s/p abdominal closure on 8/10.   Heparin level came back undetectable <0.1, on heparin 900 units/hr. No s/sx of bleeding or infusion issues. Heparin running in PICC, level being drawn from IJ Introducer - had tried previously 3 times for PIV access but unsuccessful - Heparin had been paused only for 10 minutes. Redraw again to confirm was undetectable.  Goal of Therapy:  Heparin level 0.3-0.7 units/ml aPTT 66-102 seconds Monitor platelets by anticoagulation protocol: Yes   Plan:  Increase heparin infusion to 1050 units/hr Heparin level and aPTT in 8 hr Plan to change to DOAC once have enteral access Monitor CBC, and for s/sx of bleeding   Thank you for allowing pharmacy to participate in this patient's care.  Antonietta Jewel, PharmD, [place] Clinical Pharmacist  Phone: [phone] 01/16/2022 7:13 AM  Please check AMION for all [place] phone  numbers After 10:00 PM, call [place] [phone]

## 2022-01-16 NOTE — Progress Notes (Signed)
Cardiology Progress Note  Patient ID: [name] MRN: [id number] DOB: Jun 22, 1940 Date of Encounter: 01/16/2022  Primary Cardiologist: Kirk Ruths, MD  Subjective   Chief Complaint: Weakness fatigue  HPI: Currently on TPN.  Strict NPO.  Rates are controlled on amiodarone drip.  On heparin drip as well.  ROS:  All other ROS reviewed and negative. Pertinent positives noted in the HPI.     Inpatient Medications  Scheduled Meds:  sodium chloride   Intravenous Once   arformoterol  15 mcg Nebulization BID   budesonide (PULMICORT) nebulizer solution  0.25 mg Nebulization BID   dextrose  50 mL Intravenous Once   fentaNYL (SUBLIMAZE) injection  25 mcg Intravenous Once   mouth rinse  15 mL Mouth Rinse Q2H   pantoprazole (PROTONIX) IV  40 mg Intravenous Q24H   revefenacin  175 mcg Nebulization Daily   Continuous Infusions:  sodium chloride Stopped (01/12/22 1757)   amiodarone 30 mg/hr (01/16/22 0054)   heparin 900 Units/hr (01/16/22 0000)   magnesium sulfate bolus IVPB     piperacillin-tazobactam (ZOSYN)  IV 3.375 g (01/16/22 0527)   potassium chloride 10 mEq (01/16/22 0653)   potassium chloride     TPN ADULT (ION) 63 mL/hr at 01/16/22 0000   TPN ADULT (ION)     PRN Meds: sodium chloride, acetaminophen, haloperidol lactate, levalbuterol, metoprolol tartrate, ondansetron (ZOFRAN) IV, mouth rinse, oxyCODONE, polyethylene glycol, sodium chloride flush, white petrolatum   Vital Signs   Vitals:   01/16/22 0100 01/16/22 0200 01/16/22 0300 01/16/22 0400  BP: (!) 151/71 (!) 169/64 (!) 159/65 (!) 191/67  Pulse: 99 (!) 111 90 (!) 103  Resp: (!) 27 (!) 24 19 (!) 22  Temp:   98.8 F (37.1 C)   TempSrc:   Oral   SpO2: 98% 96% 99% (!) 89%  Weight:      Height:        Intake/Output Summary (Last 24 hours) at 01/16/2022 0811 Last data filed at 01/16/2022 0000 Gross per 24 hour  Intake 1984.83 ml  Output 750 ml  Net 1234.83 ml      01/15/2022    4:33 AM 01/14/2022    5:00 AM  01/05/2022    5:00 AM  Last 3 Weights  Weight (lbs) 153 lb 14.1 oz 161 lb 2.5 oz 169 lb 12.1 oz  Weight (kg) 69.8 kg 73.1 kg 77 kg      Telemetry  Overnight telemetry shows A-fib heart rate in the 90s, which I personally reviewed.   ECG  The most recent ECG shows A-fib heart rate 68, nonspecific ST-T changes, which I personally reviewed.   Physical Exam   Vitals:   01/16/22 0100 01/16/22 0200 01/16/22 0300 01/16/22 0400  BP: (!) 151/71 (!) 169/64 (!) 159/65 (!) 191/67  Pulse: 99 (!) 111 90 (!) 103  Resp: (!) 27 (!) 24 19 (!) 22  Temp:   98.8 F (37.1 C)   TempSrc:   Oral   SpO2: 98% 96% 99% (!) 89%  Weight:      Height:        Intake/Output Summary (Last 24 hours) at 01/16/2022 0811 Last data filed at 01/16/2022 0000 Gross per 24 hour  Intake 1984.83 ml  Output 750 ml  Net 1234.83 ml       01/15/2022    4:33 AM 01/14/2022    5:00 AM 01/14/2022    5:00 AM  Last 3 Weights  Weight (lbs) 153 lb 14.1 oz 161 lb 2.5 oz 169  lb 12.1 oz  Weight (kg) 69.8 kg 73.1 kg 77 kg    Body mass index is 30.05 kg/m.   General: Ill-appearing Head: Atraumatic, normal size  Eyes: PEERLA, EOMI  Neck: Supple, JVD 8 to 10 cm of water Endocrine: No thryomegaly Cardiac: Normal S1, S2; irregular rhythm Lungs: Clear to auscultation bilaterally, no wheezing, rhonchi or rales  Abd: Soft, nontender, no hepatomegaly  Ext: No edema, pulses 2+ Musculoskeletal: No deformities, BUE and BLE strength normal and equal Skin: Warm and dry, no rashes   Neuro: Alert and oriented to person, place, time, and situation, CNII-XII grossly intact, no focal deficits  Psych: Normal mood and affect   Labs  High Sensitivity Troponin:   Recent Labs  Lab 01/02/22 1023 01/02/22 1308 01/03/22 2119 01/04/22 0513 01/05/22 0502  TROPONINIHS 8 11 123* 131* 55*     Cardiac EnzymesNo results for input(s): "TROPONINI" in the last 168 hours. No results for input(s): "TROPIPOC" in the last 168 hours.  Chemistry Recent  Labs  Lab 01/16/2022 0524 01/14/22 0708 01/14/22 2037 01/15/22 0529 01/16/22 0536  NA 132* 134* 138 138 137  K 3.8 3.7 3.4* 3.7 3.3*  CL 102 102 103 97* 98  CO2 '24 24 29 '$ 33* 32  GLUCOSE 86 160* 144* 139* 224*  BUN '19 18 17 19 18  '$ CREATININE 0.57 0.70 0.71 0.58 0.53  CALCIUM 8.6* 8.9 9.4 9.3 8.6*  PROT 4.5* 5.3*  --   --  5.2*  ALBUMIN 2.1* 2.3*  --   --  2.8*  AST 24 33  --   --  39  ALT 27 27  --   --  23  ALKPHOS 60 80  --   --  73  BILITOT 1.2 1.2  --   --  1.1  GFRNONAA >60 >60 >60 >60 >60  ANIONGAP '6 8 6 8 7    '$ Hematology Recent Labs  Lab 02/04/2022 1101 01/14/22 0708 01/14/22 0850  WBC 11.5* 18.5* 18.3*  RBC 2.45* 3.16* 2.87*  HGB 8.4* 11.0* 10.1*  HCT 25.1* 32.4* 30.2*  MCV 102.4* 102.5* 105.2*  MCH 34.3* 34.8* 35.2*  MCHC 33.5 34.0 33.4  RDW 17.9* 18.0* 17.9*  PLT 159 220 228   BNP Recent Labs  Lab 01/23/2022 1409  BNP 334.1*    DDimer No results for input(s): "DDIMER" in the last 168 hours.   Radiology  No results found.  Cardiac Studies  TTE 01/04/2022 1. All mid-to-apical LV segments are akinetic. The basal LV segments  demonstrate normal contractility. Findings most consistent with Takostubo  cardiomyopathy . No LV thrombus visualized.   2. Left ventricular ejection fraction, by estimation, is 25 to 30%. The  left ventricle has severely decreased function. The left ventricle  demonstrates regional wall motion abnormalities (see scoring  diagram/findings for description). Diastolic function   indeterminant due to Afib.   3. Right ventricular systolic function is mildly reduced. The right  ventricular size is mildly enlarged. There is mildly elevated pulmonary  artery systolic pressure. The estimated right ventricular systolic  pressure is 09.3 mmHg.   4. Left atrial size was mildly dilated.   5. Right atrial size was moderately dilated.   6. The mitral valve is grossly normal. Moderate mitral valve  regurgitation.   7. Tricuspid valve  regurgitation is mild to moderate.   8. The aortic valve is tricuspid. There is mild calcification of the  aortic valve. There is mild thickening of the aortic valve. Aortic valve  regurgitation  is mild to moderate. Aortic valve sclerosis/calcification is  present, without any evidence of  aortic stenosis.   Patient Profile  [name] is a 81 y.o. female with permanent atrial fibrillation, hypertension, HFpEF who was admitted on 01/03/2022 with pneumonia.  Cardiology was consulted for atrial fibrillation with RVR and congestive heart failure.  She developed a stress-induced cardiomyopathy.  She suffered a PEA arrest on 01/03/2022 requiring intubation and chest tube placement.  She has subsequently been extubated.  Course has been complicated by ischemic colitis status post surgical intervention.  Remains on TPN in the ICU.  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  Does not appear to represent an acute coronary syndrome. -She is volume up.  Potassium and magnesium were low.  This is being replaced.  I have ordered 40 mg of Lasix this afternoon. -She will need to be started on beta-blocker and ARB.  Right now currently strict n.p.o. on TPN.  No way to give this to her.  We will hold this for now. -Plan will be to optimize her medical therapy for cardiomyopathy.  I see no need for invasive angiography at this point.  #Permanent atrial fibrillation -Currently strict NPO.  Only option right now is IV amiodarone.  We will continue this for now. -On heparin drip until we can get her back on DOAC.  #Pneumonia #Parapneumonic effusion #PEA arrest -Initially admitted for this diagnosis.  Status post chest tube placement.  Chest tube has been discontinued. -Antibiotics per hospital medicine. -Cardiac arrest secondary to respiratory status.  No indication for ICD.  #Ischemic colitis with perforation -Suffered  ischemic colitis with perforation.  Underwent colectomy on 01/20/2022 with return to OR on 01/19/2022 for abdominal closure. -Currently strict NPO.  Limits her options for medical therapy.  See above discussion. -Antibiotics per surgery. -We will reinitiate therapy as were able.  Currently she is strict n.p.o. with TPN.    For questions or updates, please contact [place] Please consult [email or web address] for contact info under    Signed, [place] T. Audie Box, MD, [place]  01/16/2022 8:11 AM

## 2022-01-16 NOTE — Progress Notes (Signed)
3 Days Post-Op   Subjective/Chief Complaint: Pt doing well today. No bowel fxn   Objective: Vital signs in last 24 hours: Temp:  [98 F (36.7 C)-99 F (37.2 C)] 98.8 F (37.1 C) (08/12 0300) Pulse Rate:  [90-111] 103 (08/12 0400) Resp:  [16-31] 22 (08/12 0400) BP: (83-191)/(52-97) 191/67 (08/12 0400) SpO2:  [89 %-100 %] 89 % (08/12 0400) Last BM Date : 12/31/21  Intake/Output from previous day: 08/11 0701 - 08/12 0700 In: 2169.2 [I.V.:1481.4; IV Piggyback:687.8] Out: 900 [Urine:900] Intake/Output this shift: No intake/output data recorded.  PE:  Constitutional: No acute distress, conversant, appears states age. Eyes: Anicteric sclerae, moist conjunctiva, no lid lag Lungs: Clear to auscultation bilaterally, normal respiratory effort CV: regular rate and rhythm, no murmurs, no peripheral edema, pedal pulses 2+ GI: Soft, no masses or hepatosplenomegaly, non-tender to palpation, midline wound c/d/i Skin: No rashes, palpation reveals normal turgor Psychiatric: appropriate judgment and insight, oriented to person, place, and time   Lab Results:  Recent Labs    01/14/22 0708 01/14/22 0850  WBC 18.5* 18.3*  HGB 11.0* 10.1*  HCT 32.4* 30.2*  PLT 220 228   BMET Recent Labs    01/15/22 0529 01/16/22 0536  NA 138 137  K 3.7 3.3*  CL 97* 98  CO2 33* 32  GLUCOSE 139* 224*  BUN 19 18  CREATININE 0.58 0.53  CALCIUM 9.3 8.6*   PT/INR No results for input(s): "LABPROT", "INR" in the last 72 hours. ABG No results for input(s): "PHART", "HCO3" in the last 72 hours.  Invalid input(s): "PCO2", "PO2"  Studies/Results: No results found.  Anti-infectives: Anti-infectives (From admission, onward)    Start     Dose/Rate Route Frequency Ordered Stop   01/12/2022 1400  micafungin (MYCAMINE) 100 mg in sodium chloride 0.9 % 100 mL IVPB  Status:  Discontinued        100 mg 105 mL/hr over 1 Hours Intravenous Every 24 hours 01/29/2022 1302 01/15/22 1211   01/10/22 1515   piperacillin-tazobactam (ZOSYN) IVPB 3.375 g        3.375 g 12.5 mL/hr over 240 Minutes Intravenous Every 8 hours 01/10/22 1427 01/23/2022 2359   01/04/22 2000  vancomycin (VANCOCIN) IVPB 1000 mg/200 mL premix  Status:  Discontinued        1,000 mg 200 mL/hr over 60 Minutes Intravenous Every 24 hours 01/03/22 1910 01/05/22 1321   01/03/22 2200  ceFEPIme (MAXIPIME) 2 g in sodium chloride 0.9 % 100 mL IVPB        2 g 200 mL/hr over 30 Minutes Intravenous Every 12 hours 01/03/22 1910 01/08/22 2209   01/03/22 1930  vancomycin (VANCOREADY) IVPB 1250 mg/250 mL        1,250 mg 166.7 mL/hr over 90 Minutes Intravenous  Once 01/03/22 1910 01/03/22 2219   01/02/22 2100  azithromycin (ZITHROMAX) 500 mg in sodium chloride 0.9 % 250 mL IVPB        500 mg 250 mL/hr  Intravenous Every 24 hours 12/15/2021 2248 01/06/22 2149   01/02/22 2000  cefTRIAXone (ROCEPHIN) 2 g in sodium chloride 0.9 % 100 mL IVPB  Status:  Discontinued        2 g 200 mL/hr over 30 Minutes Intravenous Every 24 hours 12/12/2021 2248 01/03/22 1733   12/18/2021 2030  cefTRIAXone (ROCEPHIN) 1 g in sodium chloride 0.9 % 100 mL IVPB        1 g 200 mL/hr over 30 Minutes Intravenous  Once 12/30/2021 2025 12/25/2021 2141   12/21/2021  2030  azithromycin (ZITHROMAX) 500 mg in sodium chloride 0.9 % 250 mL IVPB        500 mg 250 mL/hr over 60 Minutes Intravenous  Once 12/13/2021 2025 12/23/2021 2312       Assessment/Plan: ischemic bowel with perforation S/p exploratory laparotomy,  R colectomy, abthera placement 01/12/2022 Dr. Rosendo Gros S/p exploratory laparotomy, intestinal anastomosis, abdominal closure 01/23/2022 Dr. Rosendo Gros -  POD 5, POD 3  - afebrile VSS  - hgb stable, WBC stable - continue TNA and await bowel function  - wet-to-dry dressing to midline BID.    FEN: PICC/TPN, Miller for meds per tube  ID: Zosyn; tentatively plan for 5d post-op (stop date 8/14 at 2359) VTE: SCD's, Miller for hep gtt  Foley: in place Dispo: ICU      Below per CCM -- Septic  shock due to above Cardiac arrest 7/30, 7 mins CPR Hx cardiomyopathy COPD Afib, permanent  LOS: 15 days    [name] 01/16/2022

## 2022-01-17 ENCOUNTER — Inpatient Hospital Stay (HOSPITAL_COMMUNITY): Payer: Medicare Other

## 2022-01-17 DIAGNOSIS — I119 Hypertensive heart disease without heart failure: Secondary | ICD-10-CM | POA: Diagnosis not present

## 2022-01-17 DIAGNOSIS — I4891 Unspecified atrial fibrillation: Secondary | ICD-10-CM | POA: Diagnosis not present

## 2022-01-17 DIAGNOSIS — K559 Vascular disorder of intestine, unspecified: Secondary | ICD-10-CM | POA: Diagnosis not present

## 2022-01-17 DIAGNOSIS — I469 Cardiac arrest, cause unspecified: Secondary | ICD-10-CM | POA: Diagnosis not present

## 2022-01-17 DIAGNOSIS — J189 Pneumonia, unspecified organism: Secondary | ICD-10-CM | POA: Diagnosis not present

## 2022-01-17 DIAGNOSIS — J9601 Acute respiratory failure with hypoxia: Secondary | ICD-10-CM | POA: Diagnosis not present

## 2022-01-17 LAB — GLUCOSE, CAPILLARY
Glucose-Capillary: 157 mg/dL — ABNORMAL HIGH (ref 70–99)
Glucose-Capillary: 120 mg/dL — ABNORMAL HIGH (ref 70–99)

## 2022-01-17 LAB — BLOOD GAS, ARTERIAL
Acid-Base Excess: 9.6 mmol/L — ABNORMAL HIGH (ref 0.0–2.0)
Bicarbonate: 35.8 mmol/L — ABNORMAL HIGH (ref 20.0–28.0)
Drawn by: 28378
O2 Saturation: 98.4 %
Patient temperature: 37
pCO2 arterial: 54 mmHg — ABNORMAL HIGH (ref 32–48)
pH, Arterial: 7.43 (ref 7.35–7.45)
pO2, Arterial: 136 mmHg — ABNORMAL HIGH (ref 83–108)

## 2022-01-17 LAB — MAGNESIUM: Magnesium: 2.2 mg/dL (ref 1.7–2.4)

## 2022-01-17 LAB — CBC WITH DIFFERENTIAL/PLATELET
Abs Immature Granulocytes: 0.17 10*3/uL — ABNORMAL HIGH (ref 0.00–0.07)
Basophils Absolute: 0 10*3/uL (ref 0.0–0.1)
Basophils Relative: 0 %
Eosinophils Absolute: 0.2 10*3/uL (ref 0.0–0.5)
Eosinophils Relative: 2 %
HCT: 26.1 % — ABNORMAL LOW (ref 36.0–46.0)
Hemoglobin: 8.8 g/dL — ABNORMAL LOW (ref 12.0–15.0)
Immature Granulocytes: 2 %
Lymphocytes Relative: 5 %
Lymphs Abs: 0.6 10*3/uL — ABNORMAL LOW (ref 0.7–4.0)
MCH: 37 pg — ABNORMAL HIGH (ref 26.0–34.0)
MCHC: 33.7 g/dL (ref 30.0–36.0)
MCV: 109.7 fL — ABNORMAL HIGH (ref 80.0–100.0)
Monocytes Absolute: 0.8 10*3/uL (ref 0.1–1.0)
Monocytes Relative: 7 %
Neutro Abs: 9.3 10*3/uL — ABNORMAL HIGH (ref 1.7–7.7)
Neutrophils Relative %: 84 %
Platelets: 235 10*3/uL (ref 150–400)
RBC: 2.38 MIL/uL — ABNORMAL LOW (ref 3.87–5.11)
RDW: 16.2 % — ABNORMAL HIGH (ref 11.5–15.5)
WBC: 11 10*3/uL — ABNORMAL HIGH (ref 4.0–10.5)
nRBC: 0 % (ref 0.0–0.2)

## 2022-01-17 LAB — PHOSPHORUS: Phosphorus: 2.9 mg/dL (ref 2.5–4.6)

## 2022-01-17 LAB — BASIC METABOLIC PANEL
Anion gap: 7 (ref 5–15)
BUN: 20 mg/dL (ref 8–23)
CO2: 33 mmol/L — ABNORMAL HIGH (ref 22–32)
Calcium: 8.9 mg/dL (ref 8.9–10.3)
Chloride: 99 mmol/L (ref 98–111)
Creatinine, Ser: 0.49 mg/dL (ref 0.44–1.00)
GFR, Estimated: >60 mL/min (ref >60)
Glucose, Bld: 135 mg/dL — ABNORMAL HIGH (ref 70–99)
Potassium: 4.2 mmol/L (ref 3.5–5.1)
Sodium: 139 mmol/L (ref 135–145)

## 2022-01-17 LAB — HEPARIN LEVEL (UNFRACTIONATED): Heparin Unfractionated: 0.36 IU/mL (ref 0.30–0.70)

## 2022-01-17 MED ORDER — HYDRALAZINE HCL 20 MG/ML IJ SOLN
10.0000 mg | Freq: Three times a day (TID) | INTRAMUSCULAR | Status: DC | PRN
Start: 2022-01-17 — End: 2022-01-25
  Administered 2022-01-17: 10 mg via INTRAVENOUS
  Filled 2022-01-17: qty 1

## 2022-01-17 MED ORDER — FUROSEMIDE 10 MG/ML IJ SOLN
40.0000 mg | Freq: Two times a day (BID) | INTRAMUSCULAR | Status: DC
  Administered 2022-01-17: 40 mg via INTRAVENOUS
  Filled 2022-01-17: qty 4

## 2022-01-17 MED ORDER — BOOST / RESOURCE BREEZE PO LIQD CUSTOM
1.0000 | Freq: Two times a day (BID) | ORAL | Status: DC
  Administered 2022-01-17 – 2022-01-20 (×4): 1 via ORAL

## 2022-01-17 MED ORDER — K PHOS MONO-SOD PHOS DI & MONO 155-852-130 MG PO TABS
500.0000 mg | ORAL_TABLET | Freq: Once | ORAL | Status: DC
  Filled 2022-01-17: qty 2

## 2022-01-17 MED ORDER — TRAVASOL 10 % IV SOLN
INTRAVENOUS | Status: AC
  Filled 2022-01-17: qty 889.2

## 2022-01-17 NOTE — Progress Notes (Signed)
[place] for Heparin Indication: atrial fibrillation  Allergies  Allergen Reactions   Chlorhexidine Gluconate Hives, Swelling and Rash       "ChloraPrep "   Doxycycline Nausea And Vomiting   Codeine Nausea Only and Other (See Comments)    Pt can tolerate when pre-medicated   Doxycycline Nausea And Vomiting   Nsaids     avoid due to gerd   Nsaids Other (See Comments)    Gi bleed   Tylenol [Acetaminophen]     Avoid due to GERD   Adhesive [Tape] Rash    Dermabond   Chlorhexidine Swelling and Rash   Codeine Nausea Only   Tylenol [Acetaminophen] Other (See Comments)    GI bleed    Patient Measurements: Height: 5' (152.4 cm) Weight: 74.3 kg (163 lb 12.8 oz) IBW/kg (Calculated) : 45.5 Heparin Dosing Weight: 60 kg   Vital Signs: Temp: 98.5 F (36.9 C) (08/13 0802) Temp Source: Oral (08/13 0802) BP: 185/102 (08/13 0802) Pulse Rate: 97 (08/13 0802)  Labs: Recent Labs    01/15/22 0529 01/15/22 2349 01/16/22 0426 01/16/22 0536 01/16/22 0809 01/16/22 1854 01/17/22 0529 01/17/22 0903  APTT 32 89*  --   --   --   --   --   --   HEPARINUNFRC  --  0.15*   < >  --  <0.10* 0.15*  --  0.36  CREATININE 0.58  --   --  0.53  --  0.54 0.49  --    < > = values in this interval not displayed.    Estimated Creatinine Clearance: 52.2 mL/min (by C-G formula based on SCr of 0.49 mg/dL).   Medical History: Past Medical History:  Diagnosis Date   Anemia    history of   Anxiety    Arthritis    Asthma    Breast cancer ([place])    Cancer ([place])    skin cancers, one melanoma    Complication of anesthesia    patient woke up during colonoscopy   Concussion    8 yrs. 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 CHF    Keratosis, actinic    Melanoma ([place])    Persistent atrial fibrillation ([place]) 12/16/2016   CHA2DS2VASC score 3   Pneumonia    hx of     Medications:  Scheduled:   sodium chloride   Intravenous Once   arformoterol  15 mcg Nebulization BID   budesonide (PULMICORT) nebulizer solution  0.25 mg Nebulization BID   dextrose  50 mL Intravenous Once   feeding supplement  1 Container Oral BID BM   furosemide  40 mg Intravenous BID   mouth rinse  15 mL Mouth Rinse Q2H   mouth rinse  15 mL Mouth Rinse 4 times per day   pantoprazole (PROTONIX) IV  40 mg Intravenous Q24H   phosphorus  500 mg Oral Once   revefenacin  175 mcg Nebulization Daily    Assessment: 38 yof presenting with acute hypoxemia related to COPD excerbation/PNA complicated by PEA arrest  - on Xarelto PTA for Afib (LD 7/29). Diet has  been advanced by surgery to clear liquids but would like to wait one more day before transitioning to oral meds. Pharmacy is consulted to resume heparin for atrial fibrillation given patient is stable s/p abdominal closure on 8/10.   Heparin level 0.36 on heparin drip rate 1200 units/hr therapeutic. First level therapeutic. No s/sx bleeding noted in chart.     Goal of Therapy:  Heparin level 0.3-0.7 units/ml aPTT 66-102 seconds Monitor platelets by anticoagulation protocol: Yes   Plan:  Continue heparin infusion at 1200 units/hr Obtain daily heparin levels and CBC Plan to change to DOAC once have enteral access Monitor H&H and for s/sx of bleeding    Gena Fray, PharmD PGY1 Pharmacy Resident   01/17/2022 10:00 AM  Please check AMION for all [place] phone numbers After 10:00 PM, call [place] [phone]

## 2022-01-17 NOTE — Progress Notes (Signed)
Cardiology Progress Note  Patient ID: [name] MRN: [id number] DOB: 1940/10/19 Date of Encounter: 01/17/2022  Primary Cardiologist: Kirk Ruths, MD  Subjective   Chief Complaint: SOB  HPI: Noticeably wheezy on examination.  Diet being advanced by general surgery.  A-fib rates are controlled.  ROS:  All other ROS reviewed and negative. Pertinent positives noted in the HPI.     Inpatient Medications  Scheduled Meds:  sodium chloride   Intravenous Once   arformoterol  15 mcg Nebulization BID   budesonide (PULMICORT) nebulizer solution  0.25 mg Nebulization BID   dextrose  50 mL Intravenous Once   feeding supplement  1 Container Oral BID BM   mouth rinse  15 mL Mouth Rinse Q2H   mouth rinse  15 mL Mouth Rinse 4 times per day   pantoprazole (PROTONIX) IV  40 mg Intravenous Q24H   phosphorus  500 mg Oral Once   revefenacin  175 mcg Nebulization Daily   Continuous Infusions:  sodium chloride Stopped (01/12/22 1757)   amiodarone 30 mg/hr (01/17/22 0857)   heparin 1,200 Units/hr (01/17/22 0832)   piperacillin-tazobactam (ZOSYN)  IV 3.375 g (01/17/22 0528)   TPN ADULT (ION) 65 mL/hr at 01/16/22 2000   TPN ADULT (ION)     PRN Meds: sodium chloride, acetaminophen, fentaNYL (SUBLIMAZE) injection, haloperidol lactate, levalbuterol, metoprolol tartrate, ondansetron (ZOFRAN) IV, mouth rinse, mouth rinse, polyethylene glycol, sodium chloride flush, white petrolatum   Vital Signs   Vitals:   01/17/22 0335 01/17/22 0512 01/17/22 0802 01/17/22 0839  BP: (!) 131/96  (!) 185/102   Pulse: 82  97   Resp: (!) 28  16 (!) 21  Temp: 98.6 F (37 C)  98.5 F (36.9 C)   TempSrc: Oral  Oral   SpO2: 100%  98%   Weight:  74.3 kg    Height:        Intake/Output Summary (Last 24 hours) at 01/17/2022 0928 Last data filed at 01/17/2022 0857 Gross per 24 hour  Intake 2296.24 ml  Output 3050 ml  Net -753.76 ml      01/17/2022    5:12 AM 01/15/2022    4:33 AM 01/14/2022    5:00 AM   Last 3 Weights  Weight (lbs) 163 lb 12.8 oz 153 lb 14.1 oz 161 lb 2.5 oz  Weight (kg) 74.3 kg 69.8 kg 73.1 kg      Telemetry  Overnight telemetry shows A-fib heart rates in the 90s, which I personally reviewed.   Physical Exam   Vitals:   01/17/22 0335 01/17/22 0512 01/17/22 0802 01/17/22 0839  BP: (!) 131/96  (!) 185/102   Pulse: 82  97   Resp: (!) 28  16 (!) 21  Temp: 98.6 F (37 C)  98.5 F (36.9 C)   TempSrc: Oral  Oral   SpO2: 100%  98%   Weight:  74.3 kg    Height:        Intake/Output Summary (Last 24 hours) at 01/17/2022 0928 Last data filed at 01/17/2022 0857 Gross per 24 hour  Intake 2296.24 ml  Output 3050 ml  Net -753.76 ml       01/17/2022    5:12 AM 01/15/2022    4:33 AM 01/14/2022    5:00 AM  Last 3 Weights  Weight (lbs) 163 lb 12.8 oz 153 lb 14.1 oz 161 lb 2.5 oz  Weight (kg) 74.3 kg 69.8 kg 73.1 kg    Body mass index is 31.99 kg/m.  General: Tachypnea noted  Head: Atraumatic, normal size  Eyes: PEERLA, EOMI  Neck: Supple, no JVD Endocrine: No thryomegaly Cardiac: Normal S1, S2; irregular rhythm, no murmurs Lungs: Wheezing on examination Abd: Soft, nontender, no hepatomegaly  Ext: No edema, pulses 2+ Musculoskeletal: No deformities, BUE and BLE strength normal and equal Skin: Warm and dry, no rashes   Neuro: Alert and oriented to person, place, time, and situation, CNII-XII grossly intact, no focal deficits  Psych: Normal mood and affect   Labs  High Sensitivity Troponin:   Recent Labs  Lab 01/02/22 1023 01/02/22 1308 01/03/22 2119 01/04/22 0513 01/05/22 0502  TROPONINIHS 8 11 123* 131* 55*     Cardiac EnzymesNo results for input(s): "TROPONINI" in the last 168 hours. No results for input(s): "TROPIPOC" in the last 168 hours.  Chemistry Recent Labs  Lab 01/22/2022 0524 01/14/22 0708 01/14/22 2037 01/16/22 0536 01/16/22 1854 01/17/22 0529  NA 132* 134*   < > 137 137 139  K 3.8 3.7   < > 3.3* 4.1 4.2  CL 102 102   < > 98 95* 99   CO2 24 24   < > 32 33* 33*  GLUCOSE 86 160*   < > 224* 156* 135*  BUN 19 18   < > '18 18 20  '$ CREATININE 0.57 0.70   < > 0.53 0.54 0.49  CALCIUM 8.6* 8.9   < > 8.6* 8.7* 8.9  PROT 4.5* 5.3*  --  5.2*  --   --   ALBUMIN 2.1* 2.3*  --  2.8*  --   --   AST 24 33  --  39  --   --   ALT 27 27  --  23  --   --   ALKPHOS 60 80  --  73  --   --   BILITOT 1.2 1.2  --  1.1  --   --   GFRNONAA >60 >60   < > >60 >60 >60  ANIONGAP 6 8   < > '7 9 7   '$ < > = values in this interval not displayed.    Hematology Recent Labs  Lab 01/24/2022 1101 01/14/22 0708 01/14/22 0850  WBC 11.5* 18.5* 18.3*  RBC 2.45* 3.16* 2.87*  HGB 8.4* 11.0* 10.1*  HCT 25.1* 32.4* 30.2*  MCV 102.4* 102.5* 105.2*  MCH 34.3* 34.8* 35.2*  MCHC 33.5 34.0 33.4  RDW 17.9* 18.0* 17.9*  PLT 159 220 228   BNP Recent Labs  Lab 01/12/2022 1409  BNP 334.1*    DDimer No results for input(s): "DDIMER" in the last 168 hours.   Radiology  No results found.  Cardiac Studies  TTE 01/04/2022  1. All mid-to-apical LV segments are akinetic. The basal LV segments  demonstrate normal contractility. Findings most consistent with Takostubo  cardiomyopathy . No LV thrombus visualized.   2. Left ventricular ejection fraction, by estimation, is 25 to 30%. The  left ventricle has severely decreased function. The left ventricle  demonstrates regional wall motion abnormalities (see scoring  diagram/findings for description). Diastolic function   indeterminant due to Afib.   3. Right ventricular systolic function is mildly reduced. The right  ventricular size is mildly enlarged. There is mildly elevated pulmonary  artery systolic pressure. The estimated right ventricular systolic  pressure is 14.4 mmHg.   4. Left atrial size was mildly dilated.   5. Right atrial size was moderately dilated.   6. The mitral valve is grossly normal. Moderate mitral valve  regurgitation.   7.  Tricuspid valve regurgitation is mild to moderate.   8. The  aortic valve is tricuspid. There is mild calcification of the  aortic valve. There is mild thickening of the aortic valve. Aortic valve  regurgitation is mild to moderate. Aortic valve sclerosis/calcification is  present, without any evidence of  aortic stenosis.   Patient Profile  [name] is a 81 y.o. female with permanent atrial fibrillation, hypertension, HFpEF who was admitted on 12/28/2021 with pneumonia.  Cardiology was consulted for atrial fibrillation with RVR and congestive heart failure.  She developed a stress-induced cardiomyopathy.  She suffered a PEA arrest on 01/03/2022 requiring intubation and chest tube placement.  She has subsequently been extubated.  Course has been complicated by ischemic colitis status post surgical intervention.  Remains on TPN in the ICU.  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  Admitted initially with pneumonia.  Suffered a PEA arrest.  Echocardiogram demonstrated likely a stress-induced cardiomyopathy. -She is wheezing on exam.  She is volume up.  We will give her Lasix 40 mg IV twice daily starting today. -Diet being advanced by surgery.  Has been strict NPO.  After discussion with nursing we will see how she does with her diet before we transition to oral medications.  She will need medical treatment of her cardiomyopathy.  Right now no way to get this in IV form.  #SOB #Wheezing -Noticeably tachypneic this morning.  I have ordered Lasix 40 mg IV twice daily.  I ordered a chest x-ray.  She just received a breathing treatment. -We will see how she does.  She is on antibiotics as well.  #Permanent atrial fibrillation -Admitted with pneumonia.  Course complicated by ischemic colitis status post surgical intervention.  Diet is being advanced. -We will see how she does.  For now continue IV amiodarone.  Likely can go back on her rate control medications when she is tolerating her  diet. -For now continue heparin.  Again can go back on DOAC once diet is advanced and tolerating.  #Pneumonia #Parapneumonic effusion #PEA arrest -Initially admitted with pneumonia.  Status post chest tube placement.  This has been discontinued.  Currently on antibiotics per hospital medicine.  #Ischemic colitis with perforation -Status post surgical intervention.  Seems to be doing better.  Diet is being advanced by surgery today.  We will see if she tolerates this before going to oral medications.  #Hypertension -I have added IV hydralazine for systolic blood pressures above 180.    For questions or updates, please contact [place] Please consult [email or web address] for contact info under     Signed, Lake Bells T. Audie Box, MD, [place]  01/17/2022 9:28 AM

## 2022-01-17 NOTE — Progress Notes (Signed)
Pharmacy Antibiotic Note  [name] is a 81 y.o. female admitted on 12/28/2021 and is s/p PEA arrest 2/2 to respiratory failure with antibiotics escalated to CAP as well as septic shock 2/2 ischemic colitis with perforation. PMH of afib on Xarelto, HTN, CHF, right breast cancer on letrozole.   Patient admitted for R sided pneumonia with 6 days of azithromycin, ceftriaxone, and then 5 days of cefepime. PT is clinically stable showing improvement in s/sx of infection with renal function at baseline.   Plan:  Continue zosyn until stop date of 8/14 Monitor renal function and dose adjust if necessary   Height: 5' (152.4 cm) Weight: 74.3 kg (163 lb 12.8 oz) IBW/kg (Calculated) : 45.5  Temp (24hrs), Avg:98.6 F (37 C), Min:97.6 F (36.4 C), Max:99.1 F (37.3 C)  Recent Labs  Lab 01/10/22 1459 01/10/22 1800 01/12/2022 0041 01/22/2022 0608 01/15/2022 1252 01/10/2022 1409 01/12/22 0341 01/06/2022 0524 01/19/2022 1101 01/14/22 0708 01/14/22 0850 01/14/22 2037 01/15/22 0529 01/16/22 0536 01/16/22 1854 01/17/22 0529  WBC  --   --    < >  --   --   --  23.6* 11.2* 11.5* 18.5* 18.3*  --   --   --   --   --   CREATININE  --   --    < > 0.66  --    < > 0.61 0.57  --  0.70  --  0.71 0.58 0.53 0.54 0.49  LATICACIDVEN 2.8* 1.6  --  2.6* 1.2  --  2.2*  --   --   --   --   --   --   --   --   --    < > = values in this interval not displayed.     Estimated Creatinine Clearance: 52.2 mL/min (by C-G formula based on SCr of 0.49 mg/dL).    Allergies  Allergen Reactions   Chlorhexidine Gluconate Hives, Swelling and Rash       "ChloraPrep "   Doxycycline Nausea And Vomiting   Codeine Nausea Only and Other (See Comments)    Pt can tolerate when pre-medicated   Doxycycline Nausea And Vomiting   Nsaids     avoid due to gerd   Nsaids Other (See Comments)    Gi bleed   Tylenol [Acetaminophen]     Avoid due to GERD   Adhesive [Tape] Rash    Dermabond   Chlorhexidine Swelling and Rash   Codeine  Nausea Only   Tylenol [Acetaminophen] Other (See Comments)    GI bleed    Antimicrobials this admission: Azithro 7/28 >> 8/2 Ceftriaxone 7/28>> 7/29 Vanc 7/30>> 8/1 Cefepime 7/31>> 8/4 Micafungin 8/7>>8/10 Zosyn 8/6>>8/14  Microbiology results: Pleural fluid 7/31: ng  Bcx x2 7/28: ng BAL 7/30: ng Ucx 7/29: ng  MRSA PCR 7/28: neg    Thank you for allowing pharmacy to participate in this patient's care.  Gena Fray, PharmD PGY1 Pharmacy Resident   01/17/2022 10:23 AM

## 2022-01-17 NOTE — Progress Notes (Signed)
PHARMACY - TOTAL PARENTERAL NUTRITION CONSULT NOTE  Indication: Bowel perf s/p repair with open abdomen  Patient Measurements: Height: 5' (152.4 cm) Weight: 74.3 kg (163 lb 12.8 oz) IBW/kg (Calculated) : 45.5 TPN AdjBW (KG): 51 Body mass index is 31.99 kg/m.  Assessment:  89 YOF admited for CAP causing respiratory failure, had a cardiac arrest after admission and was moved to the ICU.  Chest tube placed for parapneumonic effusion, extubated on 8/2. Sent out of ICU on 8/5. On 8/6 she developed ischemic colitis, initially treated conservatively but transferred back to ICU for worsening shock and confusion 8/7.  Now s/p ex lap, R colectomy for ischemic bowel with perforation. Pharmacy consulted to manage TPN.  Glucose / Insulin: A1c 4.9% - CBGs well controlled.  SSI D/C'ed 01/15/22. Electrolytes: K 4.2 post 6 runs, Mag 2.2 post 4gm IV, high CO2 likely from diuresis, others WNL Renal: SCr < 1, BUN WNL Hepatic: LFTs / tbili / TG WNL, albumin 2.8 Intake / Output; MIVF: UOP 1.5 ml/kg/hr with Lasix '40mg'$  IV GI Imaging: 8/6 KUB: Mild gaseous distension favors ileus  8/6 CT: concerning for iscehmic colitis; possible SBO and/or inflammatory ileus  8/7 KUB: diffuse gaseous small bowel and colonic distension GI Surgeries / Procedures:  8/7 Ex lap, colon resection 8/9 Ex lap, intestinal anastomosis, wound closure  Central access: PICC placed 01/12/22 TPN start date:  01/17/2022  Nutritional Goals: RD Estimated Needs Total Energy Estimated Needs: 1500-1700 kcals Total Protein Estimated Needs: 85-100 g Total Fluid Estimated Needs: >/= 1.5 L  Current Nutrition:  TPN  Boost Breeze BID CLD started 8/13  Plan:  Continue TPN at goal rate of 65 ml/hr to provide 89g AA and 1540 kCal, meeting 100% of needs Electrolytes in TPN: Na 85mq/L, increase K to 545m/L on 8/12 (= 86 mEq/day), Ca 61m661mL, Mg 4m25m, increase Phos to 25mm6m, max CL for now Add standard MVI and trace elements to TPN  D/C CBG  checks Neutra Phos '500mg'$  PO x 1 Standard TPN labs on Mon/Thurs F/u volume status and the need to concentrate TPN (hold off per discussion with TRH) F/u PO intake to advance diet  Aland Chestnutt D. Jung Yurchak,Mina MarblermD, BCPS, BCCCPPantops/2023, 9:24 AM

## 2022-01-17 NOTE — Progress Notes (Signed)
4 Days Post-Op   Subjective/Chief Complaint: + flatus.  Working with therapies with recs for CIR.  No nausea   Objective: Vital signs in last 24 hours: Temp:  [97.6 F (36.4 C)-99.1 F (37.3 C)] 98.6 F (37 C) (08/13 0335) Pulse Rate:  [81-104] 82 (08/13 0335) Resp:  [16-34] 28 (08/13 0335) BP: (95-168)/(41-108) 131/96 (08/13 0335) SpO2:  [91 %-100 %] 100 % (08/13 0335) Weight:  [74.3 kg] 74.3 kg (08/13 0512) Last BM Date : 12/31/21  Intake/Output from previous day: 08/12 0701 - 08/13 0700 In: 3046.2 [I.V.:2418; IV Piggyback:628.2] Out: 2600 [Urine:2600] Intake/Output this shift: No intake/output data recorded.  PE: GI: Soft, mild distention, some BS, midline wound is clean and packed.  Tender appropriately     Lab Results:  Recent Labs    01/14/22 0850  WBC 18.3*  HGB 10.1*  HCT 30.2*  PLT 228   BMET Recent Labs    01/16/22 1854 01/17/22 0529  NA 137 139  K 4.1 4.2  CL 95* 99  CO2 33* 33*  GLUCOSE 156* 135*  BUN 18 20  CREATININE 0.54 0.49  CALCIUM 8.7* 8.9   PT/INR No results for input(s): "LABPROT", "INR" in the last 72 hours. ABG No results for input(s): "PHART", "HCO3" in the last 72 hours.  Invalid input(s): "PCO2", "PO2"  Studies/Results: No results found.  Anti-infectives: Anti-infectives (From admission, onward)    Start     Dose/Rate Route Frequency Ordered Stop   01/28/2022 1400  micafungin (MYCAMINE) 100 mg in sodium chloride 0.9 % 100 mL IVPB  Status:  Discontinued        100 mg 105 mL/hr over 1 Hours Intravenous Every 24 hours 01/31/2022 1302 01/15/22 1211   01/10/22 1515  piperacillin-tazobactam (ZOSYN) IVPB 3.375 g        3.375 g 12.5 mL/hr over 240 Minutes Intravenous Every 8 hours 01/10/22 1427 01/06/2022 2359   01/04/22 2000  vancomycin (VANCOCIN) IVPB 1000 mg/200 mL premix  Status:  Discontinued        1,000 mg 200 mL/hr over 60 Minutes Intravenous Every 24 hours 01/03/22 1910 01/05/22 1321   01/03/22 2200  ceFEPIme (MAXIPIME)  2 g in sodium chloride 0.9 % 100 mL IVPB        2 g 200 mL/hr over 30 Minutes Intravenous Every 12 hours 01/03/22 1910 01/08/22 2209   01/03/22 1930  vancomycin (VANCOREADY) IVPB 1250 mg/250 mL        1,250 mg 166.7 mL/hr over 90 Minutes Intravenous  Once 01/03/22 1910 01/03/22 2219   01/02/22 2100  azithromycin (ZITHROMAX) 500 mg in sodium chloride 0.9 % 250 mL IVPB        500 mg 250 mL/hr  Intravenous Every 24 hours 12/25/2021 2248 01/06/22 2149   01/02/22 2000  cefTRIAXone (ROCEPHIN) 2 g in sodium chloride 0.9 % 100 mL IVPB  Status:  Discontinued        2 g 200 mL/hr over 30 Minutes Intravenous Every 24 hours 12/28/2021 2248 01/03/22 1733   12/19/2021 2030  cefTRIAXone (ROCEPHIN) 1 g in sodium chloride 0.9 % 100 mL IVPB        1 g 200 mL/hr over 30 Minutes Intravenous  Once 01/03/2022 2025 12/29/2021 2141   12/30/2021 2030  azithromycin (ZITHROMAX) 500 mg in sodium chloride 0.9 % 250 mL IVPB        500 mg 250 mL/hr over 60 Minutes Intravenous  Once 12/19/2021 2025 12/20/2021 2312       Assessment/Plan: POD  6/4 S/p exploratory laparotomy,  R colectomy, abthera placement 01/24/2022 Dr. Rosendo Gros, S/p exploratory laparotomy, intestinal anastomosis, abdominal closure 01/09/2022 Dr. Rosendo Gros  for ischemic bowel with perforation - afebrile VSS  - no new cbc in several days, order for today - continue TNA but will go ahead and start CLD, resource Breeze given some bowel function.  When tolerating more orally, will wean TNA - wet-to-dry dressing to midline BID.  -mobilize/pulm toilet.  Therapies recommend CIR -cont zosyn for 5 days post op, stop date 8/14   FEN: PICC/TPN, CLD/Breeze ID: [email or web address]; tentatively plan for 5d post-op (stop date 8/14 at 2359) VTE: SCD's, hep gtt  Foley: purewick Dispo: progressive     Below per TRH -- Septic shock due to above Cardiac arrest 7/30, 7 mins CPR Hx cardiomyopathy COPD Afib, permanent  LOS: 16 days    [name] 01/17/2022

## 2022-01-17 NOTE — Progress Notes (Signed)
PROGRESS NOTE    [name]  XNT:[id number] DOB: [birth year] DOA: 12/11/2021 PCP: [facility] [place]   Brief Narrative:  This is a 81 year old female admitted for community-acquired pneumonia and respiratory failure and concurrent cardiac arrest.  Patient had a parapneumonic effusion requiring chest tube.  Developed ischemic colitis was taken to the OR for a laparotomy with worsening shock/confusion. Patient was extubated on 01/14/2022 and now doing well.  7/28 admit, Ceftriaxone/Azithro 7/29 last dose xeralto  7/30 IHCA x 7 min, Ceftriaxone>Cefepime 7/31 Chest tube placed, heparin gtt started 8/1 patient remove chest tube.  Head CT obtained >negative.  Severely agitated overnight.  Vancomycin stopped.  Azithromycin stopped. 8/6 Transferred to [facility] 8/7 Called CCM back for abd, Distention , hypotension and a fib with RVR, plan is for  urgent OR pending goals of care discussion then ICU post op if husband is in agreement. 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  Concurrent CAP with parapneumonic effusion Questionable concurrent cardiogenic shock:  -Known history of Takotsubo  cardiomyopathy -EF 25 to 30% -Status post ex lap colectomy 01/31/2022 return on 01/24/2022 for abdominal closure -No flatus or stool at this time, continue medical management per surgery -Continue TPN - start clears today - will transition off TPN as PO is tolerated -Continue Zosyn through 01/23/2022 -Blood cultures negative -Shock resolved - off pressors  Acute hypoxic respiratory failure due to right sided CAP with parapneumonic effusion, COPD without acute exacerbation  - Extubated 01/14/2022  - Continue PT OT mobilization, flutter, incentive spirometry - never received incentive/flutter - reordered today.  - Likely extremely poor NIF (only pulling 200cc on IS) - ABG pending, CXR shows R pleural effusion - request IR evaluation for thoracentesis. PCCM called to assist given recent respiratory failure and high risk for respiratory compromise. - Neb triple therapy - COPD not in acute exacerbation, no indication for steroids at this time    SP PEA arrest 2/2 respiratory distress/ hypoxia Atrial fibrillation with RVR Takotsubo cardiomyopathy EF 25-30% on 7/31 -Recovered -likely back to baseline mental status -Heparin drip amiodarone drip ongoing - Appreciate cardiology assistance  - Will eventually need follow up Echo - Lasix ongoing per cardiology as needed, diuresing quite well   Moderate protein caloric malnutrition  Hypokalemia Hypomagnesemia Possible re-feeding syndrome -Complicated by n.p.o. status postoperatively - advance diet as above -Follow clinically, labs improving -Electrolytes currently being managed by TPN   DVT prophylaxis: Heparin drip Code Status: Full Family Communication: None present  Status is: Inpatient  Dispo: The patient is from: Home              Anticipated d/c is to: To be determined  Anticipated d/c date is: 72+ hours              Patient currently not medically stable for discharge  Consultants:  General surgery, PCCM,  cardiology  Antimicrobials:  Zosyn ongoing -currently planned through 01/19/2022 Micafungin discontinued Previously completed short course of azithromycin and cefepime.  Subjective: No acute issues or events overnight denies nausea vomiting diarrhea constipation headache fevers chills or chest pain - tachypnea this afternoon  Objective: Vitals:   01/16/22 2305 01/17/22 0335 01/17/22 0512 01/17/22 0802  BP: (!) 163/87 (!) 131/96  (!) 185/102  Pulse: 86 82  97  Resp: (!) 23 (!) 28  16  Temp: 97.6 F (36.4 C) 98.6 F (37 C)  98.5 F (36.9 C)  TempSrc: Oral Oral  Oral  SpO2: 100% 100%  98%  Weight:   74.3 kg   Height:        Intake/Output Summary (Last 24 hours) at 01/17/2022 0808 Last data filed at 01/17/2022 0806 Gross per 24 hour  Intake 2290.27 ml  Output 3050 ml  Net -759.73 ml    Filed Weights   01/14/22 0500 01/15/22 0433 01/17/22 0512  Weight: 73.1 kg 69.8 kg 74.3 kg    Examination:  General:  Pleasantly resting in bed, No acute distress. HEENT:  Normocephalic atraumatic.  Sclerae nonicteric, noninjected.  Extraocular movements intact bilaterally. Neck:  Without mass or deformity.  Trachea is midline. Lungs: Scant end expiratory wheeze right lung field diffusely, left lung field without overt wheeze rales or rhonchi Heart:  Regular rate and rhythm.  Without murmurs, rubs, or gallops. Abdomen: Large abdominal bandage clean dry intact. Extremities/GU: Pure wick in place, limbs without cyanosis, clubbing, 2+ pitting edema bilateral lower extremities. Vascular:  Dorsalis pedis and posterior tibial pulses palpable bilaterally. Skin:  Warm and dry, no erythema  Data Reviewed: I have personally reviewed following labs and imaging studies  CBC: Recent Labs  Lab 01/10/22 1006 01/26/2022 0041 01/30/2022 1102 01/12/22 0341 01/12/22 0454 01/12/22 0650 01/26/2022 0524 01/14/2022 1101 01/14/22 0708 01/14/22 0850  WBC 33.2* 29.3*  --  23.6*  --   --  11.2* 11.5* 18.5*  18.3*  NEUTROABS 30.9* 25.8*  --  21.0*  --   --  9.7*  --  16.3*  --   HGB 13.3 12.1   < > 10.2*   < > 9.5* 6.9* 8.4* 11.0* 10.1*  HCT 39.2 37.0   < > 30.5*   < > 28.0* 20.9* 25.1* 32.4* 30.2*  MCV 106.8* 110.1*  --  108.2*  --   --  107.7* 102.4* 102.5* 105.2*  PLT 246 232  --  268  --   --  143* 159 220 228   < > = values in this interval not displayed.    Basic Metabolic Panel: Recent Labs  Lab 01/24/2022 0524 01/14/22 0708 01/14/22 2037 01/15/22 0529 01/16/22 0536 01/16/22 1854 01/17/22 0529  NA 132* 134* 138 138 137 137 139  K 3.8 3.7 3.4* 3.7 3.3* 4.1 4.2  CL 102 102 103 97* 98 95* 99  CO2 '24 24 29 '$ 33* 32 33* 33*  GLUCOSE 86 160* 144* 139* 224* 156* 135*  BUN '19 18 17 19 18 18 20  '$ CREATININE 0.57 0.70 0.71 0.58 0.53 0.54 0.49  CALCIUM 8.6* 8.9 9.4 9.3 8.6* 8.7* 8.9  MG 1.9 2.1  --  1.5* 1.7  --  2.2  PHOS 3.1 3.3  --  2.1* 3.3  --  2.9    GFR:  Estimated Creatinine Clearance: 52.2 mL/min (by C-G formula based on SCr of 0.49 mg/dL). Liver Function Tests: Recent Labs  Lab 01/20/2022 1409 01/12/22 0341 01/12/2022 0524 01/14/22 0708 01/16/22 0536  AST 38 32 24 33 39  ALT 59* 47* '27 27 23  '$ ALKPHOS 101 86 60 80 73  BILITOT 2.4* 1.3* 1.2 1.2 1.1  PROT 4.5* 4.7* 4.5* 5.3* 5.2*  ALBUMIN <1.5* <1.5* 2.1* 2.3* 2.8*    No results for input(s): "LIPASE", "AMYLASE" in the last 168 hours. No results for input(s): "AMMONIA" in the last 168 hours. Coagulation Profile: No results for input(s): "INR", "PROTIME" in the last 168 hours. Cardiac Enzymes: No results for input(s): "CKTOTAL", "CKMB", "CKMBINDEX", "TROPONINI" in the last 168 hours. BNP (last 3 results) No results for input(s): "PROBNP" in the last 8760 hours. HbA1C: No results for input(s): "HGBA1C" in the last 72 hours. CBG: Recent Labs  Lab 01/16/22 0805 01/16/22 1159 01/16/22 1605 01/16/22 2014 01/16/22 2304  GLUCAP 122* 153* 115* 113* 119*    Lipid Profile: Recent Labs    01/15/22 0529  TRIG 105     Thyroid Function Tests: No results for input(s): "TSH", "T4TOTAL", "FREET4", "T3FREE", "THYROIDAB" in the last 72 hours. Anemia Panel: No results for input(s): "VITAMINB12", "FOLATE", "FERRITIN", "TIBC", "IRON", "RETICCTPCT" in the last 72 hours. Sepsis Labs: Recent Labs  Lab 01/10/22 1800 01/31/2022 0608 01/12/2022 1252 01/12/22 0341  PROCALCITON  --   --  0.38  --   LATICACIDVEN 1.6 2.6* 1.2 2.2*     No results found for this or any previous visit (from the past 240 hour(s)).       Radiology Studies: No results found.      Scheduled Meds:  sodium chloride   Intravenous Once   arformoterol  15 mcg Nebulization BID   budesonide (PULMICORT) nebulizer solution  0.25 mg Nebulization BID   dextrose  50 mL Intravenous Once   feeding supplement  1 Container Oral BID BM   mouth rinse  15 mL Mouth Rinse Q2H   mouth rinse  15 mL Mouth Rinse 4 times per day   pantoprazole (PROTONIX) IV  40 mg Intravenous Q24H   revefenacin  175 mcg Nebulization Daily   Continuous Infusions:  sodium chloride Stopped (01/12/22 1757)   amiodarone 30 mg/hr (01/17/22 0037)   heparin 1,200 Units/hr (01/16/22 2144)   piperacillin-tazobactam (ZOSYN)  IV 3.375 g (01/17/22 0528)   TPN ADULT (ION) 65 mL/hr at 01/16/22 2000    LOS: 16 days   Time spent: 23mn  Shmiel Morton C Quinne Pires, DO [facility]  If 7PM-7AM, please contact night-coverage [email or web address]  01/17/2022, 8:08 AM

## 2022-01-18 ENCOUNTER — Inpatient Hospital Stay (HOSPITAL_COMMUNITY): Payer: Medicare Other

## 2022-01-18 ENCOUNTER — Encounter (HOSPITAL_COMMUNITY): Payer: Self-pay | Admitting: Internal Medicine

## 2022-01-18 ENCOUNTER — Encounter (HOSPITAL_COMMUNITY): Admission: EM | Disposition: E | Payer: Self-pay | Source: Home / Self Care | Attending: Internal Medicine

## 2022-01-18 DIAGNOSIS — I5040 Unspecified combined systolic (congestive) and diastolic (congestive) heart failure: Secondary | ICD-10-CM | POA: Diagnosis not present

## 2022-01-18 DIAGNOSIS — R0602 Shortness of breath: Secondary | ICD-10-CM | POA: Diagnosis not present

## 2022-01-18 DIAGNOSIS — A419 Sepsis, unspecified organism: Principal | ICD-10-CM

## 2022-01-18 DIAGNOSIS — J90 Pleural effusion, not elsewhere classified: Secondary | ICD-10-CM | POA: Diagnosis not present

## 2022-01-18 DIAGNOSIS — I469 Cardiac arrest, cause unspecified: Secondary | ICD-10-CM | POA: Diagnosis not present

## 2022-01-18 DIAGNOSIS — R6521 Severe sepsis with septic shock: Secondary | ICD-10-CM

## 2022-01-18 DIAGNOSIS — J9601 Acute respiratory failure with hypoxia: Secondary | ICD-10-CM | POA: Diagnosis not present

## 2022-01-18 DIAGNOSIS — J189 Pneumonia, unspecified organism: Secondary | ICD-10-CM | POA: Diagnosis not present

## 2022-01-18 DIAGNOSIS — I4811 Longstanding persistent atrial fibrillation: Secondary | ICD-10-CM | POA: Diagnosis not present

## 2022-01-18 LAB — COMPREHENSIVE METABOLIC PANEL
ALT: 23 U/L (ref 0–44)
AST: 33 U/L (ref 15–41)
Albumin: 2.6 g/dL — ABNORMAL LOW (ref 3.5–5.0)
Alkaline Phosphatase: 86 U/L (ref 38–126)
Anion gap: 7 (ref 5–15)
BUN: 22 mg/dL (ref 8–23)
CO2: 32 mmol/L (ref 22–32)
Calcium: 9.1 mg/dL (ref 8.9–10.3)
Chloride: 99 mmol/L (ref 98–111)
Creatinine, Ser: 0.48 mg/dL (ref 0.44–1.00)
GFR, Estimated: >60 mL/min (ref >60)
Glucose, Bld: 133 mg/dL — ABNORMAL HIGH (ref 70–99)
Potassium: 3.8 mmol/L (ref 3.5–5.1)
Sodium: 138 mmol/L (ref 135–145)
Total Bilirubin: 1.1 mg/dL (ref 0.3–1.2)
Total Protein: 5.6 g/dL — ABNORMAL LOW (ref 6.5–8.1)

## 2022-01-18 LAB — ECHOCARDIOGRAM LIMITED
Calc EF: 60.6 %
Height: 60 in
P 1/2 time: 227 msec
S' Lateral: 2.8 cm
Single Plane A2C EF: 63.3 %
Single Plane A4C EF: 58.4 %
Weight: 2656.1 oz

## 2022-01-18 LAB — CBC WITH DIFFERENTIAL/PLATELET
Abs Immature Granulocytes: 0.15 10*3/uL — ABNORMAL HIGH (ref 0.00–0.07)
Basophils Absolute: 0 10*3/uL (ref 0.0–0.1)
Basophils Relative: 0 %
Eosinophils Absolute: 0.2 10*3/uL (ref 0.0–0.5)
Eosinophils Relative: 1 %
HCT: 25.5 % — ABNORMAL LOW (ref 36.0–46.0)
Hemoglobin: 8.3 g/dL — ABNORMAL LOW (ref 12.0–15.0)
Immature Granulocytes: 1 %
Lymphocytes Relative: 5 %
Lymphs Abs: 0.5 10*3/uL — ABNORMAL LOW (ref 0.7–4.0)
MCH: 33.7 pg (ref 26.0–34.0)
MCHC: 32.5 g/dL (ref 30.0–36.0)
MCV: 103.7 fL — ABNORMAL HIGH (ref 80.0–100.0)
Monocytes Absolute: 1 10*3/uL (ref 0.1–1.0)
Monocytes Relative: 8 %
Neutro Abs: 9.6 10*3/uL — ABNORMAL HIGH (ref 1.7–7.7)
Neutrophils Relative %: 85 %
Platelets: 245 10*3/uL (ref 150–400)
RBC: 2.46 MIL/uL — ABNORMAL LOW (ref 3.87–5.11)
RDW: 15.8 % — ABNORMAL HIGH (ref 11.5–15.5)
WBC: 11.4 10*3/uL — ABNORMAL HIGH (ref 4.0–10.5)
nRBC: 0 % (ref 0.0–0.2)

## 2022-01-18 LAB — BLOOD GAS, VENOUS
Acid-Base Excess: 0 mmol/L (ref 0.0–2.0)
Bicarbonate: 30 mmol/L — ABNORMAL HIGH (ref 20.0–28.0)
O2 Saturation: 76.6 %
Patient temperature: 37
pCO2, Ven: 75 mmHg (ref 44–60)
pH, Ven: 7.21 — ABNORMAL LOW (ref 7.25–7.43)
pO2, Ven: 49 mmHg — ABNORMAL HIGH (ref 32–45)

## 2022-01-18 LAB — RENAL FUNCTION PANEL
Albumin: 2.6 g/dL — ABNORMAL LOW (ref 3.5–5.0)
Anion gap: 3 — ABNORMAL LOW (ref 5–15)
BUN: 23 mg/dL (ref 8–23)
CO2: 35 mmol/L — ABNORMAL HIGH (ref 22–32)
Calcium: 9 mg/dL (ref 8.9–10.3)
Chloride: 100 mmol/L (ref 98–111)
Creatinine, Ser: 0.5 mg/dL (ref 0.44–1.00)
GFR, Estimated: >60 mL/min (ref >60)
Glucose, Bld: 130 mg/dL — ABNORMAL HIGH (ref 70–99)
Phosphorus: 3.3 mg/dL (ref 2.5–4.6)
Potassium: 3.9 mmol/L (ref 3.5–5.1)
Sodium: 138 mmol/L (ref 135–145)

## 2022-01-18 LAB — MAGNESIUM: Magnesium: 1.8 mg/dL (ref 1.7–2.4)

## 2022-01-18 LAB — TRIGLYCERIDES: Triglycerides: 89 mg/dL (ref <150)

## 2022-01-18 LAB — PHOSPHORUS: Phosphorus: 3.2 mg/dL (ref 2.5–4.6)

## 2022-01-18 LAB — HEPARIN LEVEL (UNFRACTIONATED): Heparin Unfractionated: 0.43 IU/mL (ref 0.30–0.70)

## 2022-01-18 LAB — LACTIC ACID, PLASMA: Lactic Acid, Venous: 1.3 mmol/L (ref 0.5–1.9)

## 2022-01-18 SURGERY — CANCELLED PROCEDURE

## 2022-01-18 MED ORDER — FUROSEMIDE 10 MG/ML IJ SOLN
60.0000 mg | Freq: Once | INTRAMUSCULAR | Status: AC
  Administered 2022-01-18: 60 mg via INTRAVENOUS
  Filled 2022-01-18: qty 6

## 2022-01-18 MED ORDER — PHENYLEPHRINE HCL-NACL 20-0.9 MG/250ML-% IV SOLN: 25.0000 ug/min | INTRAVENOUS | Status: DC

## 2022-01-18 MED ORDER — FUROSEMIDE 10 MG/ML IJ SOLN
40.0000 mg | Freq: Two times a day (BID) | INTRAMUSCULAR | Status: DC
  Administered 2022-01-18 – 2022-01-19 (×3): 40 mg via INTRAVENOUS
  Filled 2022-01-18 (×3): qty 4

## 2022-01-18 MED ORDER — POTASSIUM CHLORIDE 10 MEQ/50ML IV SOLN
10.0000 meq | INTRAVENOUS | Status: AC
  Administered 2022-01-18 (×3): 10 meq via INTRAVENOUS
  Filled 2022-01-18 (×3): qty 50

## 2022-01-18 MED ORDER — SODIUM CHLORIDE 0.9 % IV SOLN
250.0000 mL | INTRAVENOUS | Status: DC
  Administered 2022-01-18: 250 mL via INTRAVENOUS

## 2022-01-18 MED ORDER — TRACE MINERALS CU-MN-SE-ZN 300-55-60-3000 MCG/ML IV SOLN
INTRAVENOUS | Status: AC
  Filled 2022-01-18: qty 598.4

## 2022-01-18 MED ORDER — PHENYLEPHRINE 80 MCG/ML (10ML) SYRINGE FOR IV PUSH (FOR BLOOD PRESSURE SUPPORT)
80.0000 ug | PREFILLED_SYRINGE | Freq: Once | INTRAVENOUS | Status: DC | PRN
Start: 2022-01-18 — End: 2022-01-18

## 2022-01-18 MED ORDER — PERFLUTREN LIPID MICROSPHERE
1.0000 mL | INTRAVENOUS | Status: AC | PRN
  Administered 2022-01-18: 2 mL via INTRAVENOUS

## 2022-01-18 MED ORDER — MAGNESIUM SULFATE 2 GM/50ML IV SOLN
2.0000 g | Freq: Once | INTRAVENOUS | Status: AC
  Administered 2022-01-18: 2 g via INTRAVENOUS
  Filled 2022-01-18: qty 50

## 2022-01-18 MED FILL — Medication: Qty: 1 | Status: AC

## 2022-01-18 NOTE — Progress Notes (Signed)
Date and time results received: 01/12/2022 2124   Test: PCO2 Critical Value: 84  Name of Provider Notified: Hortencia Conradi. Meier,MD  Orders Received?  Awaiting response.   Winferd Humphrey Meier,MD at bedside.    Date and time results received: 01/19/22 0616  Test: PCO2 Critical Value: 24  Name of Provider Notified: Winferd Humphrey Meier,MD

## 2022-01-18 NOTE — Progress Notes (Signed)
5 Days Post-Op   Subjective/Chief Complaint: Seen at time of RT treatment. She is short of breath. Has been sipping on clear liquids - states she has had 1-2 8oz cups of liquids including water and coffee. Tolerating CLD without nausea. Passing flatus. No BM yet.   Objective: Vital signs in last 24 hours: Temp:  [96.2 F (35.7 C)-98.4 F (36.9 C)] 97.6 F (36.4 C) (08/14 0745) Pulse Rate:  [76-108] 84 (08/14 0745) Resp:  [18-37] 27 (08/14 0754) BP: (156-180)/(61-93) 163/66 (08/14 0745) SpO2:  [88 %-100 %] 88 % (08/14 0745) FiO2 (%):  [40 %] 40 % (08/13 1633) Weight:  [75.3 kg] 75.3 kg (08/14 0528) Last BM Date : 12/31/21  Intake/Output from previous day: 08/13 0701 - 08/14 0700 In: 1017.9 [I.V.:1017.9] Out: 2450 [Urine:2450] Intake/Output this shift: No intake/output data recorded.  PE: GI: Soft, mild distention, appropriate TTP greatest along midline incision. +BS Midline wound 15cm x 4.5cm. 3cm undermining at superior aspect      Lab Results:  Recent Labs    01/17/22 1020 01/24/2022 0538  WBC 11.0* 11.4*  HGB 8.8* 8.3*  HCT 26.1* 25.5*  PLT 235 245    BMET Recent Labs    01/17/22 0529 01/24/2022 0538  NA 139 138  K 4.2 3.8  CL 99 99  CO2 33* 32  GLUCOSE 135* 133*  BUN 20 22  CREATININE 0.49 0.48  CALCIUM 8.9 9.1    PT/INR No results for input(s): "LABPROT", "INR" in the last 72 hours. ABG Recent Labs    01/17/22 1818  PHART 7.43  HCO3 35.8*    Studies/Results: DG CHEST PORT 1 VIEW  Result Date: 01/17/2022 CLINICAL DATA:  Shortness of breath. EXAM: PORTABLE CHEST 1 VIEW COMPARISON:  One-view chest x-ray 01/12/2022 FINDINGS: Patient has been extubated. The heart is enlarged. Right pleural effusion and right-sided airspace disease has increased. Minimal atelectasis at the left base is stable. Surgical clips are again noted in the right breast or axilla. IMPRESSION: 1. Increasing right pleural effusion and right-sided airspace disease. While this  likely reflects atelectasis, infection is not excluded. 2. Stable cardiomegaly without failure. Electronically Signed   By: San Morelle M.D.   On: 01/17/2022 11:39    Anti-infectives: Anti-infectives (From admission, onward)    Start     Dose/Rate Route Frequency Ordered Stop   01/22/2022 1400  micafungin (MYCAMINE) 100 mg in sodium chloride 0.9 % 100 mL IVPB  Status:  Discontinued        100 mg 105 mL/hr over 1 Hours Intravenous Every 24 hours 01/09/2022 1302 01/15/22 1211   01/10/22 1515  piperacillin-tazobactam (ZOSYN) IVPB 3.375 g        3.375 g 12.5 mL/hr over 240 Minutes Intravenous Every 8 hours 01/10/22 1427 01/09/2022 2359   01/04/22 2000  vancomycin (VANCOCIN) IVPB 1000 mg/200 mL premix  Status:  Discontinued        1,000 mg 200 mL/hr over 60 Minutes Intravenous Every 24 hours 01/03/22 1910 01/05/22 1321   01/03/22 2200  ceFEPIme (MAXIPIME) 2 g in sodium chloride 0.9 % 100 mL IVPB        2 g 200 mL/hr over 30 Minutes Intravenous Every 12 hours 01/03/22 1910 01/08/22 2209   01/03/22 1930  vancomycin (VANCOREADY) IVPB 1250 mg/250 mL        1,250 mg 166.7 mL/hr over 90 Minutes Intravenous  Once 01/03/22 1910 01/03/22 2219   01/02/22 2100  azithromycin (ZITHROMAX) 500 mg in sodium chloride 0.9 % 250 mL  IVPB        500 mg 250 mL/hr  Intravenous Every 24 hours 12/10/2021 2248 01/06/22 2149   01/02/22 2000  cefTRIAXone (ROCEPHIN) 2 g in sodium chloride 0.9 % 100 mL IVPB  Status:  Discontinued        2 g 200 mL/hr over 30 Minutes Intravenous Every 24 hours 12/29/2021 2248 01/03/22 1733   12/29/2021 2030  cefTRIAXone (ROCEPHIN) 1 g in sodium chloride 0.9 % 100 mL IVPB        1 g 200 mL/hr over 30 Minutes Intravenous  Once 12/09/2021 2025 12/18/2021 2141   12/17/2021 2030  azithromycin (ZITHROMAX) 500 mg in sodium chloride 0.9 % 250 mL IVPB        500 mg 250 mL/hr over 60 Minutes Intravenous  Once 12/07/2021 2025 12/08/2021 2312       Assessment/Plan: POD 7/5 S/p exploratory laparotomy,  R  colectomy, abthera placement 01/26/2022 Dr. Rosendo Gros S/p exploratory laparotomy, intestinal anastomosis, abdominal closure 01/06/2022 Dr. Rosendo Gros  for ischemic bowel with perforation - afebrile - WBC stable 11.4 - continue TNA and advance to FLD/ensure. When tolerating more orally, will wean TNA - wet-to-dry dressing to midline BID.  -mobilize/pulm toilet.  Therapies recommend CIR -cont zosyn for 5 days post op, stop date today   FEN: PICC/TPN, FLD/ensure ID: [email or web address]; tentatively plan for 5d post-op (stop date 8/14 at 2359) VTE: SCD's, hep gtt  Foley: purewick Dispo: progressive     Below per TRH -- Septic shock due to above Cardiac arrest 7/30, 7 mins CPR Hx cardiomyopathy COPD Afib, permanent   LOS: 17 days    Winferd Humphrey, [facility] 01/28/2022, 8:20 AM Please see Amion for pager number during day hours 7:00am-4:30pm

## 2022-01-18 NOTE — Progress Notes (Signed)
Brief PCCM Progress Note  Remains on BiPAP, drowsy but arousable and no longer in distress. Started AVAPS in setting of what looks like R hemidiaphragm paresis and possible component of diaphragmatic weakness related to her complicated hospitalization/critical illness. Will repeat vbg in [place]

## 2022-01-18 NOTE — Progress Notes (Signed)
Brief PCCM Progress Note  Alert and appropriate on BiPAP with good volumes 300-500 but tachypneic to ~40, saturation 100%. Some concern for R hemidiapragm paresis and atelectasis, not enough fluid to tap earlier, has been diuresed a lot today. Probably also pretty weak after this hospitalization. Will follow up on CXR, vbg, lactic. If still looking this tachypneic after a couple hours should bring to ICU, may need intubation.  [place]

## 2022-01-18 NOTE — Progress Notes (Signed)
Inpatient Rehab Admissions Coordinator:  Saw pt and husband at bedside. Pt continues to warrant medical workup. Pt also remains on TPN. Will continue to monitor medical workup as well as participation and progress with therapies.   Gayland Curry, [place], [place] Admissions Coordinator [phone]

## 2022-01-18 NOTE — Plan of Care (Signed)

## 2022-01-18 NOTE — Progress Notes (Signed)
NAME:  [name], MRN:  [id number], DOB:  01/02/1941, LOS: 21 ADMISSION DATE:  12/24/2021, CONSULTATION DATE:  01/12/2022 REFERRING MD:  Bonner Puna, CHIEF COMPLAINT:  Hypotension, abdominal distention    History of Present Illness:  81 yo female admited for CAP causing respiratory failure, had a cardiac arrest after admission and was moved to the ICU.  Chest tube placed for parapneumonic effusion, extubated on 8/2. Sent out of ICU on 8/5. 8/6 developed ischemic colitis initially treated conservatively but this morning we were called back 8/7 for worsening shock, confusion.  She is going to the OR today.   Pertinent  Medical History  Breast ca on letrozole Afib on [place] Hospital Events: Including procedures, antibiotic start and stop dates in addition to other pertinent events   7/28 admit, Ceftriaxone/Azithro 7/29 last dose xeralto  7/30 IHCA x 7 min, Ceftriaxone>Cefepime 7/31 Chest tube placed, heparin gtt started 8/1 patient remove chest tube.  Head CT obtained >negative.  Severely agitated overnight.  Vancomycin stopped.  Azithromycin stopped. 8/6 Transferred to [facility] 8/7 Called CCM back for abd, Distention , hypotension and a fib with RVR, plan is for  urgent OR pending goals of care discussion then ICU post op if husband is in agreement. 8/10 Abdomen closed yesterday, SBT/SAT today 8/11 extubated yesterday, doing well on [place], up OOB, feeling alright  Interim History / Subjective:  More short of breath since transfer out of ICU. Now started on TPN for malnutrition  Objective   Blood pressure (!) 161/70, pulse 84, temperature 97.6 F (36.4 C), temperature source Oral, resp. rate 20, height 5' (1.524 m), weight 75.3 kg, SpO2 99 %.    FiO2 (%):  [36 %-40 %] 36 %   Intake/Output Summary (Last 24 hours) at 02/04/2022 1246 Last data filed at 01/10/2022 1119 Gross per 24 hour  Intake 874.22 ml  Output 3200 ml  Net -2325.78 ml   Filed Weights   01/15/22 0433  01/17/22 0512 02/03/2022 0528  Weight: 69.8 kg 74.3 kg 75.3 kg    Elderly, weak on nasal cannula Breath sounds diminished bilaterally.  Able to pull 250cc on IS Irregularly irregular HR. Minimal le edema   Significant labs Na 139 Cr 0.48 WBC 11.4, neutrophil predominance Hgb 8.3 Resolved Hospital Problem list   Septic shock Hypoxic Respiratory Failure  Assessment & Plan:   Acute hypoxemic respiratory failure CAP with parapneumonic effusion on the right side s/p pigtail drainage (now removed) Acute pulmonary edema with AF with RVR on amiodarone gtt Takotsubo cardiomyopathy with acute decompensated HFrEF EF 25% Severe protein calorie malnutrition on TPN  Chest xray reviewed. Small right sided pleural effusion emerging likely secondary to volume status Hard to trust her I/O in the chart - she has been here 16 days and is on multiple IV medications so I suspect still net volume overloaded.  Continue diuresis per cardiology.  I discussed thoracentesis with the patient and she would rather manage the effusion which at this point is likely transudative from volume, CHF, AF with diuretics. Reviewed IS technique and importance.  She is severely deconditioned with atelectasis contributing to her hypoxemia Would love to get her in chair position if not OOB to chair with PT/nursing. This is essential for her respiratory health.   Pulmonary will follow.  Lenice Llamas, MD [facility] [place] 01/05/2022 12:50 PM Pager: see AMION  If no response to pager, please call critical care on call (see AMION) until 7pm After  7:00 pm call [place]

## 2022-01-18 NOTE — Progress Notes (Signed)
  Echocardiogram 2D Echocardiogram has been performed.  [name] 01/17/2022, 2:33 PM

## 2022-01-18 NOTE — Progress Notes (Signed)
PROGRESS NOTE    [name]  WIO:[id number] DOB: 22-Oct-1940 DOA: 01/02/2022 PCP: [facility] @ [place]   Brief Narrative:  This is a 81 year old female admitted for community-acquired pneumonia and respiratory failure and concurrent cardiac arrest.  Patient had a parapneumonic effusion requiring chest tube.  Developed ischemic colitis was taken to the OR for a laparotomy with worsening shock/confusion. Patient was extubated on 01/14/2022 and now doing well.  7/28 admit, Ceftriaxone/Azithro 7/29 last dose xeralto  7/30 IHCA x 7 min, Ceftriaxone>Cefepime 7/31 Chest tube placed, heparin gtt started 8/1 patient remove chest tube.  Head CT obtained >negative.  Severely agitated overnight.  Vancomycin stopped.  Azithromycin stopped. 8/6 Transferred to [facility] 8/7 Called CCM back for abd, Distention , hypotension and a fib with RVR, plan is for  urgent OR pending goals of care discussion then ICU post op if husband is in agreement. 8/10 Abdomen closed yesterday, SBT/SAT today 8/11 extubated yesterday, doing well on [place], up OOB, feeling alright 8/13 small BM/flatus -> clears per Gen Sx (continue TPN for nutrition/volume) worsening respiratory status late afternoon - xray shows R effusion - lasix and thoracentesis ordered - PCCM asked to evaluate given profoundly poor inspiratory efforts given recent intubation 8/14 responded well to diuresis, continue today. Eval for thoracentesis today w/ IR.  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   Hypotension   A-fib (HCC)   Lactic acid increased   Cardiac arrest ([place])   Acute respiratory failure with hypoxia (HCC)   Pneumonia of right lower lobe due to infectious organism   Takotsubo cardiomyopathy   Ischemic colitis ([place])   Septic shock ([place])   Septic shock Ischemic colitis with perforation.  Concurrent CAP with  parapneumonic effusion Questionable concurrent cardiogenic shock:  -Known history of Takotsubo cardiomyopathy -EF 25 to 30% -Status post ex lap colectomy 01/17/2022 return on 01/26/2022 for abdominal closure -No flatus or stool at this time, continue medical management per surgery -Continue TPN - start clears today - will transition off TPN as PO is tolerated -Continue Zosyn through 01/17/2022 -Blood cultures negative -Shock resolved - off pressors  Acute hypoxic respiratory failure due to right sided CAP with parapneumonic effusion, COPD without acute exacerbation  - Extubated 01/14/2022  - Continue PT OT mobilization, flutter, incentive spirometry - never received incentive/flutter - reordered today.  - Likely extremely poor NIF (only pulling 200cc on IS) - ABG pending, CXR shows R pleural effusion - request IR evaluation for thoracentesis. PCCM called to assist given recent respiratory failure and high risk for respiratory compromise. - Neb triple therapy - COPD not in acute exacerbation, no indication for steroids at this time    SP PEA arrest 2/2 respiratory distress/ hypoxia Atrial fibrillation with RVR Takotsubo cardiomyopathy EF 25-30% on 7/31 -Recovered -likely back to baseline mental status -Heparin drip amiodarone drip ongoing - Appreciate cardiology assistance  - Will eventually need follow up Echo  Repeat Echo today per Cardiology   Moderate protein caloric malnutrition  Hypokalemia Hypomagnesemia Possible re-feeding syndrome -Complicated by n.p.o. status postoperatively - advance diet as above -Follow clinically, labs improving -Electrolytes currently being managed by TPN   DVT prophylaxis: Heparin drip Code Status: Full Family Communication: None present  Status is: Inpatient  Dispo: The patient is from: Home              Anticipated d/c is to: To be determined  Anticipated d/c date is: 72+ hours              Patient currently not medically stable for  discharge  Consultants:  General surgery, PCCM, cardiology  Antimicrobials:  Zosyn ongoing -currently planned through 01/17/2022 Micafungin discontinued Previously completed short course of azithromycin and cefepime.  Subjective: No acute issues or events overnight denies nausea vomiting diarrhea constipation headache fevers chills or chest pain - tachypnea this afternoon  Objective: Vitals:   01/14/2022 0745 01/21/2022 0754 01/30/2022 0828 01/15/2022 1100  BP: (!) 163/66   (!) 161/70  Pulse: 84  80 84  Resp: (!) 26 (!) 27 (!) 29 20  Temp: 97.6 F (36.4 C)     TempSrc: Oral   Axillary  SpO2: (!) 88%  96% 99%  Weight:      Height:        Intake/Output Summary (Last 24 hours) at 01/14/2022 1130 Last data filed at 01/23/2022 1119 Gross per 24 hour  Intake 874.22 ml  Output 3200 ml  Net -2325.78 ml    Filed Weights   01/15/22 0433 01/17/22 0512 01/16/2022 0528  Weight: 69.8 kg 74.3 kg 75.3 kg    Examination:  General:  Pleasantly resting in bed, No acute distress. HEENT:  Normocephalic atraumatic.  Sclerae nonicteric, noninjected.  Extraocular movements intact bilaterally. Neck:  Without mass or deformity.  Trachea is midline. Lungs: Scant end expiratory wheeze right lung field diffusely, left lung field without overt wheeze rales or rhonchi Heart:  Regular rate and rhythm.  Without murmurs, rubs, or gallops. Abdomen: Large abdominal bandage clean dry intact. Extremities/GU: Pure wick in place, limbs without cyanosis, clubbing, 2+ pitting edema bilateral lower extremities. Vascular:  Dorsalis pedis and posterior tibial pulses palpable bilaterally. Skin:  Warm and dry, no erythema  Data Reviewed: I have personally reviewed following labs and imaging studies  CBC: Recent Labs  Lab 01/12/22 0341 01/12/22 0454 01/15/2022 0524 01/30/2022 1101 01/14/22 0708 01/14/22 0850 01/17/22 1020 01/17/2022 0538  WBC 23.6*  --  11.2* 11.5* 18.5* 18.3* 11.0* 11.4*  NEUTROABS 21.0*  --  9.7*   --  16.3*  --  9.3* 9.6*  HGB 10.2*   < > 6.9* 8.4* 11.0* 10.1* 8.8* 8.3*  HCT 30.5*   < > 20.9* 25.1* 32.4* 30.2* 26.1* 25.5*  MCV 108.2*  --  107.7* 102.4* 102.5* 105.2* 109.7* 103.7*  PLT 268  --  143* 159 220 228 235 245   < > = values in this interval not displayed.    Basic Metabolic Panel: Recent Labs  Lab 01/14/22 0708 01/14/22 2037 01/15/22 0529 01/16/22 0536 01/16/22 1854 01/17/22 0529 01/19/2022 0538  NA 134*   < > 138 137 137 139 138  K 3.7   < > 3.7 3.3* 4.1 4.2 3.8  CL 102   < > 97* 98 95* 99 99  CO2 24   < > 33* 32 33* 33* 32  GLUCOSE 160*   < > 139* 224* 156* 135* 133*  BUN 18   < > '19 18 18 20 22  '$ CREATININE 0.70   < > 0.58 0.53 0.54 0.49 0.48  CALCIUM 8.9   < > 9.3 8.6* 8.7* 8.9 9.1  MG 2.1  --  1.5* 1.7  --  2.2 1.8  PHOS 3.3  --  2.1* 3.3  --  2.9 3.2   < > = values in this interval not displayed.    GFR: Estimated Creatinine Clearance: 52.5 mL/min (by C-G formula based  on SCr of 0.48 mg/dL). Liver Function Tests: Recent Labs  Lab 01/12/22 0341 01/21/2022 0524 01/14/22 0708 01/16/22 0536 01/19/2022 0538  AST 32 24 33 39 33  ALT 47* '27 27 23 23  '$ ALKPHOS 86 60 80 73 86  BILITOT 1.3* 1.2 1.2 1.1 1.1  PROT 4.7* 4.5* 5.3* 5.2* 5.6*  ALBUMIN <1.5* 2.1* 2.3* 2.8* 2.6*    No results for input(s): "LIPASE", "AMYLASE" in the last 168 hours. No results for input(s): "AMMONIA" in the last 168 hours. Coagulation Profile: No results for input(s): "INR", "PROTIME" in the last 168 hours. Cardiac Enzymes: No results for input(s): "CKTOTAL", "CKMB", "CKMBINDEX", "TROPONINI" in the last 168 hours. BNP (last 3 results) No results for input(s): "PROBNP" in the last 8760 hours. HbA1C: No results for input(s): "HGBA1C" in the last 72 hours. CBG: Recent Labs  Lab 01/16/22 1605 01/16/22 2014 01/16/22 2304 01/17/22 1221 01/17/22 2317  GLUCAP 115* 113* 119* 157* 120*    Lipid Profile: Recent Labs    01/26/2022 0538  TRIG 89    Thyroid Function Tests: No  results for input(s): "TSH", "T4TOTAL", "FREET4", "T3FREE", "THYROIDAB" in the last 72 hours. Anemia Panel: No results for input(s): "VITAMINB12", "FOLATE", "FERRITIN", "TIBC", "IRON", "RETICCTPCT" in the last 72 hours. Sepsis Labs: Recent Labs  Lab 02/01/2022 1252 01/12/22 0341  PROCALCITON 0.38  --   LATICACIDVEN 1.2 2.2*     No results found for this or any previous visit (from the past 240 hour(s)).       Radiology Studies: DG CHEST PORT 1 VIEW  Result Date: 01/12/2022 CLINICAL DATA:  Respiratory distress, shortness of breath EXAM: PORTABLE CHEST 1 VIEW COMPARISON:  01/17/2022 FINDINGS: Persistent right pleural effusion with adjacent atelectasis/consolidation. Change in appearance may be due to differences in positioning. Persistent probable mild atelectasis at the left lung base. Similar partially obscured cardiomediastinal contours. Left subclavian line is unchanged. IMPRESSION: Persistent right pleural effusion and adjacent atelectasis/consolidation. Electronically Signed   By: Macy Mis M.D.   On: 01/29/2022 10:22   DG CHEST PORT 1 VIEW  Result Date: 01/17/2022 CLINICAL DATA:  Shortness of breath. EXAM: PORTABLE CHEST 1 VIEW COMPARISON:  One-view chest x-ray 01/23/2022 FINDINGS: Patient has been extubated. The heart is enlarged. Right pleural effusion and right-sided airspace disease has increased. Minimal atelectasis at the left base is stable. Surgical clips are again noted in the right breast or axilla. IMPRESSION: 1. Increasing right pleural effusion and right-sided airspace disease. While this likely reflects atelectasis, infection is not excluded. 2. Stable cardiomegaly without failure. Electronically Signed   By: [place] Morelle M.D.   On: 01/17/2022 11:39        Scheduled Meds:  arformoterol  15 mcg Nebulization BID   budesonide (PULMICORT) nebulizer solution  0.25 mg Nebulization BID   dextrose  50 mL Intravenous Once   feeding supplement  1 Container Oral  BID BM   mouth rinse  15 mL Mouth Rinse Q2H   mouth rinse  15 mL Mouth Rinse 4 times per day   pantoprazole (PROTONIX) IV  40 mg Intravenous Q24H   revefenacin  175 mcg Nebulization Daily   Continuous Infusions:  sodium chloride Stopped (01/12/22 1757)   amiodarone 30 mg/hr (01/23/2022 0013)   heparin 1,200 Units/hr (01/17/22 1841)   piperacillin-tazobactam (ZOSYN)  IV 3.375 g (02/03/2022 0530)   TPN ADULT (ION) 65 mL/hr at 01/17/22 1724   TPN ADULT (ION)      LOS: 17 days   Time spent: 11mn  Blonnie Maske  Alanda Amass, DO [facility]  If 7PM-7AM, please contact night-coverage [email or web address]  01/19/2022, 11:30 AM

## 2022-01-18 NOTE — Progress Notes (Signed)
Spoke With [employer], pt do not need  vasopressor at this moment and no need for USG PIV.

## 2022-01-18 NOTE — Progress Notes (Signed)
SLP Cancellation Note  Patient Details Name: [name] MRN: [id number] DOB: [birth year]   Cancelled treatment:       Reason Eval/Treat Not Completed: Patient at procedure or test/unavailable; echo being performed when ST attempted; will continue to f/u; chart review revealed pt is now on full liquid diet.   Elvina Sidle, M.S., CCC-SLP 01/10/2022, 2:03 PM

## 2022-01-18 NOTE — Progress Notes (Signed)
ANTICOAGULATION CONSULT NOTE   Pharmacy Consult for Heparin Indication: atrial fibrillation  Allergies  Allergen Reactions   Chlorhexidine Gluconate Hives, Swelling and Rash       "ChloraPrep "   Doxycycline Nausea And Vomiting   Codeine Nausea Only and Other (See Comments)    Pt can tolerate when pre-medicated   Doxycycline Nausea And Vomiting   Nsaids     avoid due to gerd   Nsaids Other (See Comments)    Gi bleed   Tylenol [Acetaminophen]     Avoid due to GERD   Adhesive [Tape] Rash    Dermabond   Chlorhexidine Swelling and Rash   Codeine Nausea Only   Tylenol [Acetaminophen] Other (See Comments)    GI bleed    Patient Measurements: Height: 5' (152.4 cm) Weight: 75.3 kg (166 lb 0.1 oz) IBW/kg (Calculated) : 45.5 Heparin Dosing Weight: 60 kg   Vital Signs: Temp: 97.6 F (36.4 C) (08/14 0745) Temp Source: Axillary (08/14 1100) BP: 161/70 (08/14 1100) Pulse Rate: 84 (08/14 1100)  Labs: Recent Labs    01/15/22 2349 01/16/22 0426 01/16/22 1854 01/17/22 0529 01/17/22 0903 01/17/22 1020 01/24/2022 0537 01/24/2022 0538  HGB  --   --   --   --   --  8.8*  --  8.3*  HCT  --   --   --   --   --  26.1*  --  25.5*  PLT  --   --   --   --   --  235  --  245  APTT 89*  --   --   --   --   --   --   --   HEPARINUNFRC 0.15*   < > 0.15*  --  0.36  --  0.43  --   CREATININE  --    < > 0.54 0.49  --   --   --  0.48   < > = values in this interval not displayed.     Estimated Creatinine Clearance: 52.5 mL/min (by C-G formula based on SCr of 0.48 mg/dL).   Medical History: Past Medical History:  Diagnosis Date   Anemia    history of   Anxiety    Arthritis    Asthma    Breast cancer ([place])    Cancer ([place])    skin cancers, one melanoma    Complication of anesthesia    patient woke up during colonoscopy   Concussion    8 yrs. ago due to being thrown from a horse   Dislocated elbow 10/12/2016   left   Family history of breast cancer    GERD (gastroesophageal  reflux disease)    History of kidney stones    History of radiation therapy 03/23/17-05/04/17   right chest wall 50.4 Gy in 28 fractions, right axilla 45 Gy in 25 fractions   Hypertension    Hypertensive heart disease without CHF    Keratosis, actinic    Melanoma (HCC)    Persistent atrial fibrillation ([place]) 12/16/2016   CHA2DS2VASC score 3   Pneumonia    hx of     Medications:  Scheduled:   arformoterol  15 mcg Nebulization BID   budesonide (PULMICORT) nebulizer solution  0.25 mg Nebulization BID   dextrose  50 mL Intravenous Once   feeding supplement  1 Container Oral BID BM   mouth rinse  15 mL Mouth Rinse Q2H   mouth rinse  15 mL Mouth Rinse 4 times per  day   pantoprazole (PROTONIX) IV  40 mg Intravenous Q24H   revefenacin  175 mcg Nebulization Daily    Assessment: 81 yo F presenting with acute hypoxemia related to COPD excerbation/PNA complicated by PEA arrest  - on Xarelto PTA for Afib (LD 7/29). Admission complicated by small bowel perf and R colectomy.  Patient was NPO and started on TPN with slow advancement of diet.  Advancing diet today to full liquids.Pt continues on heparin until able to tolerate oral medications.   Heparin remains therapeutic on 1200 units/hr. No s/sx bleeding noted in chart.     Goal of Therapy:  Heparin level 0.3-0.7 units/ml aPTT 66-102 seconds Monitor platelets by anticoagulation protocol: Yes   Plan:  Continue heparin infusion at 1200 units/hr Obtain daily heparin levels and CBC Plan to change to DOAC once tolerating oral meds. Monitor H&H and for s/sx of bleeding    [employer], Pharm.D., BCPS Clinical Pharmacist Clinical phone for 01/07/2022 from 7:30-3:00 is [phone].  **Pharmacist phone directory can be found on [email or web address] listed under [place].  01/23/2022 1:47 PM

## 2022-01-18 NOTE — Progress Notes (Signed)
PHARMACY - TOTAL PARENTERAL NUTRITION CONSULT NOTE  Indication: Bowel perf s/p repair with open abdomen  Patient Measurements: Height: 5' (152.4 cm) Weight: 75.3 kg (166 lb 0.1 oz) IBW/kg (Calculated) : 45.5 TPN AdjBW (KG): 51 Body mass index is 32.42 kg/m.  Assessment:  37 YOF admited for CAP causing respiratory failure, had a cardiac arrest after admission and was moved to the ICU.  Chest tube placed for parapneumonic effusion, extubated on 8/2. Sent out of ICU on 8/5. On 8/6 she developed ischemic colitis, initially treated conservatively but transferred back to ICU for worsening shock and confusion 8/7.  Now s/p ex lap, R colectomy for ischemic bowel with perforation. Pharmacy consulted to manage TPN.  Glucose / Insulin: A1c 4.9% - CBGs well controlled.  SSI D/C'ed 01/15/22. Electrolytes: K 3.8 (goal >/= 4), Mag 1.8 (goal >/= 2), others WNL Renal: SCr < 1, BUN WNL Hepatic: LFTs / tbili / TG WNL, albumin 2.6 Intake / Output; MIVF: UOP 1.4 ml/kg/hr with Lasix, net +1.7L GI Imaging: 8/6 KUB: Mild gaseous distension favors ileus  8/6 CT: concerning for iscehmic colitis; possible SBO and/or inflammatory ileus  8/7 KUB: diffuse gaseous small bowel and colonic distension GI Surgeries / Procedures:  8/7 Ex lap, colon resection 8/9 Ex lap, intestinal anastomosis, wound closure  Central access: PICC placed 01/12/22 TPN start date:  02/02/2022  Nutritional Goals: RD Estimated Needs Total Energy Estimated Needs: 1500-1700 kcals Total Protein Estimated Needs: 85-100 g Total Fluid Estimated Needs: >/= 1.5 L  Current Nutrition:  TPN  Boost Breeze BID - 1 charted given yesterday CLD started 8/13 - minimal intake  Plan:  Concentrate TPN with continued Lasix and documented volume overload  TPN at goal rate of 55 ml/hr will provide 90g AA and 1503 kCal, meeting 100% of needs Electrolytes in TPN: Na 31mq/L, K 6760m/L (= 86 mEq/day), Ca 60m8mL, push Mg to 160m25m, max Phos at 25mm1m, max CL  for now - adjusted to maintain previous provision with change in TPN rate Add standard MVI and trace elements to TPN  KCL x 3 runs, Mag sulfate 2gm IV - supplement outside of TPN to account for Lasix Standard TPN labs on Mon/Thurs, labs in AM with diureses F/u PO intake to advance diet and start weaning TPN  [name] D. Njeri Vicente,Mina MarblermD, BCPS, BCCCPChamberlayne/2023, 7:16 AM

## 2022-01-18 NOTE — Significant Event (Signed)
Rapid Response Event Note   Reason for Call :  Respiratory distress, acute oxygen desaturation to 70s-80% on 4LNC  Initial Focused Assessment:  Pt lying in bed, opens her eyes to voice. She is oriented and nodding appropriately. Lung sounds are diminished in the bases. Upper lobes are rhonchus with crackles. Skin is cool to touch, dry, pink.   VS: T 98.26F, BP 92/63, HR 84, RR 24, SpO2 75% on 4LNC  Interventions:  -BiPAP -CXR -40 mg IV lasix  Plan of Care:  -BiPAP and lasix, reevaluate in 1 hour  Call rapid response for additional needs  Event Summary:  MD Notified: Dr. Verlee Monte Call Time: 3692 Arrival Time: 2300 End Time: 1930  Casimer Bilis, RN

## 2022-01-18 NOTE — Procedures (Signed)
Asked to evaluate patient for thoracentesis given respiratory distress. Right pleural space visualized below with atelectatic lung and insufficient pleural fluid for thoracentesis.   Recommend returning to inpatient care and attaining net negative fluid balance through diuretics. Incentive spirometry, OOB and chair position.   Pulmonary will follow.

## 2022-01-18 NOTE — Care Management Important Message (Signed)
Important Message  Patient Details  Name: [name] MRN: [id number] Date of Birth: August 22, 1940   Medicare Important Message Given:  Yes     Shelda Altes 01/09/2022, 10:53 AM

## 2022-01-18 NOTE — Progress Notes (Signed)
Progress Note  Patient Name: [name] Date of Encounter: 01/24/2022  [facility] Cardiologist: Kirk Ruths, MD   Subjective   Very weak.  Hoarse.  Dyspneic at rest. Net diuresis 1.4 liters last 24h.  Inpatient Medications    Scheduled Meds:  arformoterol  15 mcg Nebulization BID   budesonide (PULMICORT) nebulizer solution  0.25 mg Nebulization BID   dextrose  50 mL Intravenous Once   feeding supplement  1 Container Oral BID BM   mouth rinse  15 mL Mouth Rinse Q2H   mouth rinse  15 mL Mouth Rinse 4 times per day   pantoprazole (PROTONIX) IV  40 mg Intravenous Q24H   revefenacin  175 mcg Nebulization Daily   Continuous Infusions:  sodium chloride Stopped (01/12/22 1757)   amiodarone 30 mg/hr (01/14/2022 0013)   heparin 1,200 Units/hr (01/17/22 1841)   magnesium sulfate bolus IVPB 2 g (01/29/2022 0923)   piperacillin-tazobactam (ZOSYN)  IV 3.375 g (01/21/2022 0530)   potassium chloride 10 mEq (01/30/2022 0929)   TPN ADULT (ION) 65 mL/hr at 01/17/22 1724   TPN ADULT (ION)     PRN Meds: sodium chloride, acetaminophen, fentaNYL (SUBLIMAZE) injection, haloperidol lactate, hydrALAZINE, levalbuterol, metoprolol tartrate, ondansetron (ZOFRAN) IV, mouth rinse, mouth rinse, polyethylene glycol, sodium chloride flush, white petrolatum   Vital Signs    Vitals:   01/14/2022 0528 01/26/2022 0745 01/16/2022 0754 02/02/2022 0828  BP:  (!) 163/66    Pulse:  84  80  Resp:  (!) 26 (!) 27 (!) 29  Temp:  97.6 F (36.4 C)    TempSrc:  Oral    SpO2:  (!) 88%  96%  Weight: 75.3 kg     Height:        Intake/Output Summary (Last 24 hours) at 01/24/2022 0940 Last data filed at 01/26/2022 0030 Gross per 24 hour  Intake 874.22 ml  Output 2000 ml  Net -1125.78 ml      01/17/2022    5:28 AM 01/17/2022    5:12 AM 01/15/2022    4:33 AM  Last 3 Weights  Weight (lbs) 166 lb 0.1 oz 163 lb 12.8 oz 153 lb 14.1 oz  Weight (kg) 75.3 kg 74.3 kg 69.8 kg      Telemetry    Atrial fibrillation  with mild RVR- Personally Reviewed  ECG    No new tracing- Personally Reviewed  Physical Exam  Appears very frail, weak voice and weak cough. GEN: No acute distress.   Neck: 8 cm JVD Cardiac: RRR, no murmurs, rubs, or gallops.  Respiratory: Diminished breath sounds in bases, but otherwise clear to auscultation bilaterally.  No wheezing today GI: Soft, nontender, non-distended  MS: No edema; No deformity. Neuro:  Nonfocal  Psych: Normal affect   Labs    High Sensitivity Troponin:   Recent Labs  Lab 01/02/22 1023 01/02/22 1308 01/03/22 2119 01/04/22 0513 01/05/22 0502  TROPONINIHS 8 11 123* 131* 55*     Chemistry Recent Labs  Lab 01/14/22 0708 01/14/22 2037 01/16/22 0536 01/16/22 1854 01/17/22 0529 02/04/2022 0538  NA 134*   < > 137 137 139 138  K 3.7   < > 3.3* 4.1 4.2 3.8  CL 102   < > 98 95* 99 99  CO2 24   < > 32 33* 33* 32  GLUCOSE 160*   < > 224* 156* 135* 133*  BUN 18   < > '18 18 20 22  '$ CREATININE 0.70   < > 0.53 0.54  0.49 0.48  CALCIUM 8.9   < > 8.6* 8.7* 8.9 9.1  MG 2.1   < > 1.7  --  2.2 1.8  PROT 5.3*  --  5.2*  --   --  5.6*  ALBUMIN 2.3*  --  2.8*  --   --  2.6*  AST 33  --  39  --   --  33  ALT 27  --  23  --   --  23  ALKPHOS 80  --  73  --   --  86  BILITOT 1.2  --  1.1  --   --  1.1  GFRNONAA >60   < > >60 >60 >60 >60  ANIONGAP 8   < > '7 9 7 7   '$ < > = values in this interval not displayed.    Lipids  Recent Labs  Lab 01/15/2022 0538  TRIG 89    Hematology Recent Labs  Lab 01/14/22 0850 01/17/22 1020 01/12/2022 0538  WBC 18.3* 11.0* 11.4*  RBC 2.87* 2.38* 2.46*  HGB 10.1* 8.8* 8.3*  HCT 30.2* 26.1* 25.5*  MCV 105.2* 109.7* 103.7*  MCH 35.2* 37.0* 33.7  MCHC 33.4 33.7 32.5  RDW 17.9* 16.2* 15.8*  PLT 228 235 245   Thyroid No results for input(s): "TSH", "FREET4" in the last 168 hours.  BNP Recent Labs  Lab 01/28/2022 1409  BNP 334.1*    DDimer No results for input(s): "DDIMER" in the last 168 hours.   Radiology    DG CHEST  PORT 1 VIEW  Result Date: 01/17/2022 CLINICAL DATA:  Shortness of breath. EXAM: PORTABLE CHEST 1 VIEW COMPARISON:  One-view chest x-ray 02/04/2022 FINDINGS: Patient has been extubated. The heart is enlarged. Right pleural effusion and right-sided airspace disease has increased. Minimal atelectasis at the left base is stable. Surgical clips are again noted in the right breast or axilla. IMPRESSION: 1. Increasing right pleural effusion and right-sided airspace disease. While this likely reflects atelectasis, infection is not excluded. 2. Stable cardiomegaly without failure. Electronically Signed   By: San Morelle M.D.   On: 01/17/2022 11:39    Cardiac Studies   ECHO 01/04/2022   1. All mid-to-apical LV segments are akinetic. The basal LV segments  demonstrate normal contractility. Findings most consistent with Takostubo  cardiomyopathy . No LV thrombus visualized.   2. Left ventricular ejection fraction, by estimation, is 25 to 30%. The  left ventricle has severely decreased function. The left ventricle  demonstrates regional wall motion abnormalities (see scoring  diagram/findings for description). Diastolic function   indeterminant due to Afib.   3. Right ventricular systolic function is mildly reduced. The right  ventricular size is mildly enlarged. There is mildly elevated pulmonary  artery systolic pressure. The estimated right ventricular systolic  pressure is 86.7 mmHg.   4. Left atrial size was mildly dilated.   5. Right atrial size was moderately dilated.   6. The mitral valve is grossly normal. Moderate mitral valve  regurgitation.   7. Tricuspid valve regurgitation is mild to moderate.   8. The aortic valve is tricuspid. There is mild calcification of the  aortic valve. There is mild thickening of the aortic valve. Aortic valve  regurgitation is mild to moderate. Aortic valve sclerosis/calcification is  present, without any evidence of  aortic stenosis.    Comparison(s): Compared to prior TTE, the LVEF has dropped to 25%  (previously 55%). Mitral regurgitation now appears moderate (likely  functional) which was previously trivial.  Patient Profile     81 y.o. 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 Has clinical evidence of hypervolemia. Weight assessment has been very variable, but she is anywhere from 13 to 26 lb over her baseline weight of about 140 lb. Continue IV diuretics. AFib: on amio IV for rate control and IV heparin due to NPO status. Will switch to oral beta blockers, xarelto once reliably taking PO. Sepsis: resolved. Initial admission for pneumonia. S/P partial colectomy: intestinal reanastomosis during second surgery on 08/09.  HTN: oral beta blockers and resume ARB when able. Asthma: prefer bisoprolol for AF rate control due to history of reactive airway disease. ABG yesterday c/w chronic hypoventilation     For questions or updates, please contact [place] Please consult [email or web address] for contact info under        Signed, Sanda Klein, MD  02/03/2022, 9:40 AM

## 2022-01-19 ENCOUNTER — Inpatient Hospital Stay (HOSPITAL_COMMUNITY): Payer: Medicare Other

## 2022-01-19 DIAGNOSIS — R0602 Shortness of breath: Secondary | ICD-10-CM | POA: Diagnosis not present

## 2022-01-19 DIAGNOSIS — I469 Cardiac arrest, cause unspecified: Secondary | ICD-10-CM | POA: Diagnosis not present

## 2022-01-19 DIAGNOSIS — J189 Pneumonia, unspecified organism: Secondary | ICD-10-CM | POA: Diagnosis not present

## 2022-01-19 DIAGNOSIS — I5181 Takotsubo syndrome: Secondary | ICD-10-CM | POA: Diagnosis not present

## 2022-01-19 DIAGNOSIS — J9601 Acute respiratory failure with hypoxia: Secondary | ICD-10-CM | POA: Diagnosis not present

## 2022-01-19 DIAGNOSIS — I4811 Longstanding persistent atrial fibrillation: Secondary | ICD-10-CM | POA: Diagnosis not present

## 2022-01-19 DIAGNOSIS — I2721 Secondary pulmonary arterial hypertension: Secondary | ICD-10-CM

## 2022-01-19 LAB — BLOOD GAS, VENOUS
Acid-base deficit: 1.4 mmol/L (ref 0.0–2.0)
Bicarbonate: 28.5 mmol/L — ABNORMAL HIGH (ref 20.0–28.0)
O2 Saturation: 73.6 %
Patient temperature: 37
pCO2, Ven: 73 mmHg (ref 44–60)
pH, Ven: 7.2 — ABNORMAL LOW (ref 7.25–7.43)
pO2, Ven: 49 mmHg — ABNORMAL HIGH (ref 32–45)

## 2022-01-19 LAB — CBC WITH DIFFERENTIAL/PLATELET
Abs Immature Granulocytes: 0.12 10*3/uL — ABNORMAL HIGH (ref 0.00–0.07)
Basophils Absolute: 0 10*3/uL (ref 0.0–0.1)
Basophils Relative: 0 %
Eosinophils Absolute: 0.1 10*3/uL (ref 0.0–0.5)
Eosinophils Relative: 1 %
HCT: 26 % — ABNORMAL LOW (ref 36.0–46.0)
Hemoglobin: 8.6 g/dL — ABNORMAL LOW (ref 12.0–15.0)
Immature Granulocytes: 1 %
Lymphocytes Relative: 7 %
Lymphs Abs: 0.8 10*3/uL (ref 0.7–4.0)
MCH: 34.4 pg — ABNORMAL HIGH (ref 26.0–34.0)
MCHC: 33.1 g/dL (ref 30.0–36.0)
MCV: 104 fL — ABNORMAL HIGH (ref 80.0–100.0)
Monocytes Absolute: 1.2 10*3/uL — ABNORMAL HIGH (ref 0.1–1.0)
Monocytes Relative: 11 %
Neutro Abs: 8.8 10*3/uL — ABNORMAL HIGH (ref 1.7–7.7)
Neutrophils Relative %: 80 %
Platelets: 240 10*3/uL (ref 150–400)
RBC: 2.5 MIL/uL — ABNORMAL LOW (ref 3.87–5.11)
RDW: 15.5 % (ref 11.5–15.5)
WBC: 11 10*3/uL — ABNORMAL HIGH (ref 4.0–10.5)
nRBC: 0.2 % (ref 0.0–0.2)

## 2022-01-19 LAB — BASIC METABOLIC PANEL
Anion gap: 6 (ref 5–15)
BUN: 26 mg/dL — ABNORMAL HIGH (ref 8–23)
CO2: 35 mmol/L — ABNORMAL HIGH (ref 22–32)
Calcium: 9.2 mg/dL (ref 8.9–10.3)
Chloride: 100 mmol/L (ref 98–111)
Creatinine, Ser: 0.62 mg/dL (ref 0.44–1.00)
GFR, Estimated: >60 mL/min (ref >60)
Glucose, Bld: 102 mg/dL — ABNORMAL HIGH (ref 70–99)
Potassium: 3.7 mmol/L (ref 3.5–5.1)
Sodium: 141 mmol/L (ref 135–145)

## 2022-01-19 LAB — HEPARIN LEVEL (UNFRACTIONATED): Heparin Unfractionated: 0.33 IU/mL (ref 0.30–0.70)

## 2022-01-19 LAB — MAGNESIUM: Magnesium: 1.8 mg/dL (ref 1.7–2.4)

## 2022-01-19 MED ORDER — POTASSIUM CHLORIDE 10 MEQ/50ML IV SOLN
10.0000 meq | INTRAVENOUS | Status: AC
  Administered 2022-01-19 (×2): 10 meq via INTRAVENOUS
  Filled 2022-01-19 (×2): qty 50

## 2022-01-19 MED ORDER — POTASSIUM CHLORIDE 10 MEQ/50ML IV SOLN
10.0000 meq | INTRAVENOUS | Status: AC
  Administered 2022-01-19 (×2): 10 meq via INTRAVENOUS
  Filled 2022-01-19 (×4): qty 50

## 2022-01-19 MED ORDER — MAGNESIUM SULFATE 2 GM/50ML IV SOLN
2.0000 g | Freq: Once | INTRAVENOUS | Status: AC
Start: 2022-01-19 — End: 2022-01-19
  Administered 2022-01-19: 2 g via INTRAVENOUS
  Filled 2022-01-19: qty 50

## 2022-01-19 MED ORDER — TRACE MINERALS CU-MN-SE-ZN 300-55-60-3000 MCG/ML IV SOLN
INTRAVENOUS | Status: DC
  Filled 2022-01-19: qty 598.4

## 2022-01-19 MED ORDER — SODIUM CHLORIDE 3 % IN NEBU
4.0000 mL | INHALATION_SOLUTION | Freq: Once | RESPIRATORY_TRACT | Status: AC
Start: 2022-01-19 — End: 2022-01-19
  Administered 2022-01-19: 4 mL via RESPIRATORY_TRACT
  Filled 2022-01-19: qty 4

## 2022-01-19 MED ORDER — POLYETHYLENE GLYCOL 3350 17 G PO PACK
17.0000 g | PACK | Freq: Every day | ORAL | Status: DC
  Administered 2022-01-19 – 2022-01-22 (×4): 17 g via ORAL
  Filled 2022-01-19 (×4): qty 1

## 2022-01-19 MED ORDER — ORAL CARE MOUTH RINSE
15.0000 mL | OROMUCOSAL | Status: DC | PRN
Start: 2022-01-19 — End: 2022-01-25

## 2022-01-19 MED ORDER — GUAIFENESIN 100 MG/5ML PO LIQD
10.0000 mL | ORAL | Status: DC | PRN
Start: 2022-01-19 — End: 2022-01-26
  Administered 2022-01-19: 10 mL via ORAL
  Filled 2022-01-19: qty 10

## 2022-01-19 MED ORDER — DM-GUAIFENESIN ER 30-600 MG PO TB12
1.0000 | ORAL_TABLET | Freq: Two times a day (BID) | ORAL | Status: DC
  Filled 2022-01-19: qty 1

## 2022-01-19 NOTE — Progress Notes (Signed)
Speech Language Pathology Treatment: Dysphagia  Patient Details Name: [name] MRN: [id number] DOB: 21-Dec-1940 Today's Date: 01/19/2022 Time: 0932-3557 SLP Time Calculation (min) (ACUTE ONLY): 26 min  Assessment / Plan / Recommendation Clinical Impression  Patient seen to assess oropharyngeal swallow and tolerance of po advancement in diet.  Pt with meal tray on table including sandwich, peaches, chips, soda and water.  She is on 4 Liters oxygen and reports her goal today is to "get her breathing better".  Diet advanced to soft from liquids per MD notes.  Pt able to self feed without assistance and SLP observed intake including peaches, soda and water via straw.       She takes large boluses and demonstrates several respiratory cycles while swallowing - with a deep inhalation x1 with delayed swallow. Suspect some cognitive component to her dysphagia. Although pt did not overtly cough nor aspirate - her risk is elevated if consuming po while dyspneic.  Encouraged her to take very small bites, masticate well and organize bolus and swallow using teach back with her spouse and patient.  Pt self describes swallow function to be at level 5/10 - 10 normal and she is moderately hoarse.  Spouse reports pt's voice was much better after her first extubation.     Will follow closely to assure po tolerance and determine indication for instrumental evaluation.  Both spouse and pt agreeable to plan.    HPI HPI: Patient is a 81 y.o. female presented to [facility] on 12/07/2021 with SOB, productive cough and right sided chest pain for three days prior to admission. On EMS arrival, pt was hypoxic on RA at 85% and was placed on NRB. CXR showed right-sided PNA. On 7/30, rapid respose was called due to pt found unresponsive in hospital bed. CPR started and ROSC achieved after 7 minutes. Pt was intubated by CRNA and transferred to [facility]. On 8/2 she was extubated to supplemental oxygen via [place]. EEG 8/2 with no seizure activity.  Improved cognition 8/5, but persistent tachycardia/afib with RVR. 8/6 ischemic colitis. 8/7 shock with intubation and to OR for ex lap. 8/9 abdomen closure. 8/10 extubation.  PMH: afib on [place], asthma, GERD, essential HTN, chronic diastolic CHF, right breast cancer, h/o syncope, generalized anxiety disorder, moderate pulmonary  hypertension.  Follow up regarding swallowing indicated, Pt has been advanced to a soft diet/thin liquids.  She has respiratory complications on evening 8/14 with tachypnea.  CXR from 01/19/2022 showed No significant change in AP portable examination. Large right  pleural effusion and associated atelectasis or consolidation. The  left lung is normally aerated. No new airspace opacity.  Pt denies dysphagia PTA and has not used oxygen at home.      SLP Plan  Continue with current plan of care      Recommendations for follow up therapy are one component of a multi-disciplinary discharge planning process, led by the attending physician.  Recommendations may be updated based on patient status, additional functional criteria and insurance authorization.    Recommendations  Diet recommendations: Dysphagia 3 (mechanical soft);Thin liquid Liquids provided via: Cup;Straw Medication Administration: Crushed with puree (crushed per pt request) Supervision: Patient able to self feed;Full supervision/cueing for compensatory strategies Compensations: Slow rate;Small sips/bites;Minimize environmental distractions Postural Changes and/or Swallow Maneuvers: Seated upright 90 degrees;Upright 30-60 min after meal                Oral Care Recommendations: Oral care BID Follow Up Recommendations: Follow physician's recommendations for discharge plan and follow up  therapies (TBD) Assistance recommended at discharge: Frequent or constant Supervision/Assistance SLP Visit Diagnosis: Dysphagia, unspecified (R13.10) Plan: Continue with current plan of care         Kathleen Lime, MS [place] Office [phone] Pager [phone]   Macario Golds  01/19/2022, 2:04 PM

## 2022-01-19 NOTE — Progress Notes (Signed)
ANTICOAGULATION CONSULT NOTE   Pharmacy Consult for Heparin Indication: atrial fibrillation  Allergies  Allergen Reactions   Chlorhexidine Gluconate Hives, Swelling and Rash       "ChloraPrep "   Doxycycline Nausea And Vomiting   Codeine Nausea Only and Other (See Comments)    Pt can tolerate when pre-medicated   Doxycycline Nausea And Vomiting   Nsaids     avoid due to gerd   Nsaids Other (See Comments)    Gi bleed   Tylenol [Acetaminophen]     Avoid due to GERD   Adhesive [Tape] Rash    Dermabond   Chlorhexidine Swelling and Rash   Codeine Nausea Only   Tylenol [Acetaminophen] Other (See Comments)    GI bleed    Patient Measurements: Height: 5' (152.4 cm) Weight: 75.3 kg (166 lb 0.1 oz) IBW/kg (Calculated) : 45.5 Heparin Dosing Weight: 60 kg   Vital Signs: Temp: 98.9 F (37.2 C) (08/15 0831) Temp Source: Axillary (08/15 0831) BP: 160/63 (08/15 0831) Pulse Rate: 85 (08/15 0831)  Labs: Recent Labs    01/17/22 0529 01/17/22 0903 01/17/22 1020 01/17/22 1020 01/17/2022 0537 01/20/2022 0538 01/19/22 0500 01/19/22 0645  HGB  --   --  8.8*   < >  --  8.3*  --  8.6*  HCT  --   --  26.1*  --   --  25.5*  --  26.0*  PLT  --   --  235  --   --  245  --  240  HEPARINUNFRC  --  0.36  --   --  0.43  --  0.33  --   CREATININE 0.49  --   --   --   --  0.50  0.48  --  0.62   < > = values in this interval not displayed.     Estimated Creatinine Clearance: 52.5 mL/min (by C-G formula based on SCr of 0.62 mg/dL).  Assessment: 81 yo F presenting with acute hypoxemia related to COPD excerbation/PNA complicated by PEA arrest  - on Xarelto PTA for Afib (LD 7/29). Admission complicated by small bowel perf and R colectomy.  Patient was NPO and started on TPN with slow advancement of diet.  Advancing diet today to full liquids.Pt continues on heparin until able to tolerate oral medications.   Heparin remains therapeutic on 1200 units/hr. No s/sx bleeding noted in chart.      Goal of Therapy:  Heparin level 0.3-0.7 units/ml aPTT 66-102 seconds Monitor platelets by anticoagulation protocol: Yes   Plan:  Continue heparin infusion at 1200 units/hr Obtain daily heparin levels and CBC Plan to change to DOAC once tolerating oral meds. Monitor H&H and for s/sx of bleeding   Thank you Anette Guarneri, PharmD  01/19/2022 9:26 AM

## 2022-01-19 NOTE — Progress Notes (Signed)
Occupational Therapy Treatment Patient Details Name: [name] MRN: [id number] DOB: 1940/11/23 Today's Date: 01/19/2022   History of present illness 81 y.o. female admitted 12/09/2021 with CAP.  7/30 AMS, head CT negative for acute abnormality. Code blue 7/30, ROSC achieved after 7 min CPR, pt intubated. Chest tube insertion 7/31; pt removed CT 8/1. ETT 7/30-8/2. Continued AMS, hallucinations overnight 8/2; head CT 8/2 remains negative for acute abnormality. EEG 8/2 with no seizure activity. Improved cognition 8/5, but persistent tachycardia/afib with RVR. 8/6 ischemic colitis. 8/7 shock with intubation and to OR for ex lap. 8/9 abdomen closure. 8/10 extubation.  PMH includes afib, HTN, breast CA, melanoma, asthma, bilateral TKA, anxiety.   OT comments  Pt is progressing toward established OT goals. Pt currently requires minA for bed mobility, maxA for LB dressing and minA with stand pivot transfer from EOB to recliner. Pt on 4lnc throughout session SpO2 >96% on 4lnc with exertion, SpO2 100% on 4lnc at rest. Increased wob and DoE 3/4 with stand pivot transfer. Pt educated on importance of mobility, sitting in recliner, use of IS and diaphragmatic breathing exercises. Pt will continue to benefit from skilled OT services to maximize safety and independence with ADL/IADL and functional mobility. Will continue to follow acutely and progress as tolerated.     Recommendations for follow up therapy are one component of a multi-disciplinary discharge planning process, led by the attending physician.  Recommendations may be updated based on patient status, additional functional criteria and insurance authorization.    Follow Up Recommendations  Acute inpatient rehab (3hours/day)    Assistance Recommended at Discharge Frequent or constant Supervision/Assistance  Patient can return home with the following  A lot of help with walking and/or transfers;A lot of help with bathing/dressing/bathroom;Assistance  with cooking/housework;Assistance with feeding;Direct supervision/assist for medications management;Direct supervision/assist for financial management;Assist for transportation;Help with stairs or ramp for entrance   Equipment Recommendations  None recommended by OT    Recommendations for Other Services      Precautions / Restrictions Precautions Precautions: Fall;Other (comment) Precaution Comments: watch HR Restrictions Weight Bearing Restrictions: No       Mobility Bed Mobility Overal bed mobility: Needs Assistance Bed Mobility: Supine to Sit   Sidelying to sit: Min assist, HOB elevated       General bed mobility comments: minA for cues and assist with LLE progression toward R side of bed    Transfers Overall transfer level: Needs assistance Equipment used: Rolling walker (2 wheels) Transfers: Sit to/from Stand, Bed to chair/wheelchair/BSC Sit to Stand: Min assist, +2 safety/equipment Stand pivot transfers: Min assist, +2 physical assistance, +2 safety/equipment         General transfer comment: minA to powerup into standing from EOB, assist for stability in standing     Balance Overall balance assessment: Needs assistance Sitting-balance support: Feet supported Sitting balance-Leahy Scale: Fair Sitting balance - Comments: minguard sitting EOB, pt with 1x minor loos of balance able to self correct   Standing balance support: Bilateral upper extremity supported Standing balance-Leahy Scale: Poor Standing balance comment: heavy reliance on BUE support in standing                           ADL either performed or assessed with clinical judgement   ADL Overall ADL's : Needs assistance/impaired     Grooming: Set up;Sitting               Lower Body Dressing: Maximal assistance Lower  Body Dressing Details (indicate cue type and reason): maxA to don socks, minA for sit<>stand Toilet Transfer: Minimal assistance;+2 for physical assistance;+2 for  safety/equipment;Rolling walker (2 wheels) Toilet Transfer Details (indicate cue type and reason): with stand pivot to recliner to simulate transfer to [place] and Hygiene: Maximal assistance Toileting - Clothing Manipulation Details (indicate cue type and reason): pt will require maxA for peri/posterior care     Functional mobility during ADLs: Minimal assistance;+2 for safety/equipment;+2 for physical assistance;Rolling walker (2 wheels) General ADL Comments: pt limited by decreased activity tolerance, instability, weakness and generalized deconditioning    Extremity/Trunk Assessment Upper Extremity Assessment Upper Extremity Assessment: Generalized weakness   Lower Extremity Assessment Lower Extremity Assessment: Defer to PT evaluation        Vision       Perception     Praxis      Cognition Arousal/Alertness: Awake/alert Behavior During Therapy: WFL for tasks assessed/performed Overall Cognitive Status: Impaired/Different from baseline Area of Impairment: Attention, Memory, Following commands, Safety/judgement, Problem solving, Awareness                   Current Attention Level: Selective Memory: Decreased short-term memory Following Commands: Follows one step commands with increased time, Follows multi-step commands with increased time Safety/Judgement: Decreased awareness of safety, Decreased awareness of deficits Awareness: Emergent Problem Solving: Slow processing, Requires verbal cues General Comments: more appropriate during session and able to remain on task, cues for safety        Exercises Exercises: Other exercises Other Exercises Other Exercises: diaphragmatic breathing education and engagement Other Exercises: shoulder flexion/extension with paired breaths Other Exercises: educated pt on importance of sitting in recliner and use of incentive spriometer, pt reaching 250    Shoulder Instructions       General  Comments pt's husband present during session and very encouraging towards pt    Pertinent Vitals/ Pain       Pain Assessment Pain Assessment: No/denies pain  Home Living                                          Prior Functioning/Environment              Frequency  Min 2X/week        Progress Toward Goals  OT Goals(current goals can now be found in the care plan section)  Progress towards OT goals: Progressing toward goals  Acute Rehab OT Goals Patient Stated Goal: to get stronger Time For Goal Achievement: 01/29/22 Potential to Achieve Goals: Good ADL Goals Pt Will Perform Grooming: with supervision;standing Pt Will Perform Upper Body Dressing: with supervision;sitting Pt Will Perform Lower Body Dressing: with min assist;sit to/from stand Pt Will Transfer to Toilet: with min guard assist;ambulating;regular height toilet Pt Will Perform Toileting - Clothing Manipulation and hygiene: with min guard assist;sitting/lateral leans Additional ADL Goal #1: Pt will follow multistep directions with 100% accuracy  Plan Discharge plan remains appropriate    Co-evaluation    PT/OT/SLP Co-Evaluation/Treatment: Yes Reason for Co-Treatment: Complexity of the patient's impairments (multi-system involvement);For patient/therapist safety;To address functional/ADL transfers   OT goals addressed during session: ADL's and self-care      AM-PAC OT "6 Clicks" Daily Activity     Outcome Measure   Help from another person eating meals?: A Little Help from another person taking care of personal grooming?: A Little Help from  another person toileting, which includes using toliet, bedpan, or urinal?: A Lot Help from another person bathing (including washing, rinsing, drying)?: A Lot Help from another person to put on and taking off regular upper body clothing?: A Little Help from another person to put on and taking off regular lower body clothing?: A Lot 6 Click Score:  15    End of Session Equipment Utilized During Treatment: Rolling walker (2 wheels);Oxygen (4L)  OT Visit Diagnosis: Other abnormalities of gait and mobility (R26.89);Other symptoms and signs involving cognitive function;Muscle weakness (generalized) (M62.81)   Activity Tolerance Patient tolerated treatment well   Patient Left in chair;with call bell/phone within reach;with chair alarm set   Nurse Communication Mobility status        Time: 3005-1102 OT Time Calculation (min): 30 min  Charges: OT General Charges $OT Visit: 1 Visit OT Treatments $Self Care/Home Management : 8-22 mins  Helene Kelp OTR/L Acute Rehabilitation Services Office: [place] 01/19/2022, 12:09 PM

## 2022-01-19 NOTE — Progress Notes (Signed)
SLP Cancellation Note  Patient Details Name: [name] MRN: [id number] DOB: 17-Dec-1940   Cancelled treatment:       Reason Eval/Treat Not Completed: Other (comment) (per chart review, pt has TPN and had respiratory event= tachypnea, desaturation of oxygen and upper lobe rhoncorous- will continue efforts)  Messaged RN re: concerns and await guidance.   Kathleen Lime, MS [facility] SLP Acute Rehab Services Office [phone] Pager [phone]  Macario Golds 01/19/2022, 8:59 AM

## 2022-01-19 NOTE — Progress Notes (Addendum)
PHARMACY - TOTAL PARENTERAL NUTRITION CONSULT NOTE  Indication: Bowel perf s/p repair with open abdomen  Patient Measurements: Height: 5' (152.4 cm) Weight: 75.3 kg (166 lb 0.1 oz) IBW/kg (Calculated) : 45.5 TPN AdjBW (KG): 51 Body mass index is 32.42 kg/m.  Assessment:  10 YOF admited for CAP causing respiratory failure, had a cardiac arrest after admission and was moved to the ICU.  Chest tube placed for parapneumonic effusion, extubated on 8/2. Sent out of ICU on 8/5. On 8/6 she developed ischemic colitis, initially treated conservatively but transferred back to ICU for worsening shock and confusion 8/7.  Now s/p ex lap, R colectomy for ischemic bowel with perforation. Pharmacy consulted to manage TPN.  Glucose / Insulin: A1c 4.9% - CBGs well controlled.  SSI D/C'ed 01/15/22. Electrolytes: K 3.7 (goal >/= 4), Mag 1.8 (goal >/= 2), CO2 35, others WNL Renal: SCr < 1, BUN up to 26 Hepatic: LFTs / tbili / TG WNL, albumin 2.6 Intake / Output; MIVF: UOP 1.8 ml/kg/hr with Lasix, net -3.2L GI Imaging: 8/6 KUB: Mild gaseous distension favors ileus  8/6 CT: concerning for iscehmic colitis; possible SBO and/or inflammatory ileus  8/7 KUB: diffuse gaseous small bowel and colonic distension GI Surgeries / Procedures:  8/7 Ex lap, colon resection 8/9 Ex lap, intestinal anastomosis, wound closure  Central access: PICC placed 01/12/22 TPN start date:  01/05/2022  Nutritional Goals: RD Estimated Needs Total Energy Estimated Needs: 1500-1700 kcals Total Protein Estimated Needs: 85-100 g Total Fluid Estimated Needs: >/= 1.5 L  Current Nutrition:  TPN  Boost Breeze BID - 1 charted given yesterday FLD started 8/14 - no intake due to cancelled IR throacentesis and RR called for respiratory distress and patient placed on BiPAP  Plan:  Concentrate TPN with continued Lasix and documented volume overload  TPN at goal rate of 55 ml/hr will provide 90g AA and 1503 kCal, meeting 100% of  needs Electrolytes in TPN: Na 77mq/L, K increased to 743m/L (= 92 mEq/day), Ca 36m43mL, Mg 136m21m, max Phos at 25mm7m, max CL for now - adjusted to maintain previous provision with change in TPN rate Add standard MVI and trace elements to TPN  KCl 10mEq89m runs, Mag sulfate 2gm IV - supplement outside of TPN to account for Lasix Standard TPN labs on Mon/Thurs, labs in AM with diureses F/u PO intake to advance diet and start weaning TPN  CLubeckmD, BCPS Clinical Pharmacist 01/19/2022 7:57 AM   Please refer to AMION for pharmacy phone number   __________________________________________  Addendum: Patient ate full breakfast tray this AM and is tolerating well. Per discussion with surgery provider, will wean TPN to 30ml/h95marting at 11:30 and then discontinue TPN at 18:00.   TPN order for tonight has been cancelled.  Pharmacy will sign off. Please re-consult pharmacy if needed.   Charls Custer JLuisa HartD, BCPS Clinical Pharmacist 01/19/2022 11:08 AM   Please refer to AMION for pharmacy phone number

## 2022-01-19 NOTE — Progress Notes (Signed)
Progress Note  Patient Name: [name] Date of Encounter: 01/19/2022  [facility] Cardiologist: Kirk Ruths, MD   Subjective   Breathing a little better today and voices a little stronger. Excellent urine output over 3 L in last 24 hours.  Intake not recorded, but suspect she has about 1.6 l negative balance.  Has not been weighed yet today. Controlled ventricular rate in the 80s. The echo shows complete resolution of left ventricular wall motion abnormalities and return of LVEF to normal range of 60-65%.  It also shows severely elevated pulmonary artery pressure with estimated systolic PAP of almost 80 mmHg.  Inpatient Medications    Scheduled Meds:  arformoterol  15 mcg Nebulization BID   budesonide (PULMICORT) nebulizer solution  0.25 mg Nebulization BID   dextrose  50 mL Intravenous Once   feeding supplement  1 Container Oral BID BM   furosemide  40 mg Intravenous BID   mouth rinse  15 mL Mouth Rinse Q2H   mouth rinse  15 mL Mouth Rinse 4 times per day   pantoprazole (PROTONIX) IV  40 mg Intravenous Q24H   revefenacin  175 mcg Nebulization Daily   Continuous Infusions:  sodium chloride Stopped (01/12/22 1757)   sodium chloride 250 mL (01/26/2022 2030)   amiodarone 30 mg/hr (01/19/22 0057)   heparin 1,200 Units/hr (01/12/2022 2321)   magnesium sulfate bolus IVPB 2 g (01/19/22 0900)   potassium chloride     TPN ADULT (ION) 55 mL/hr at 01/22/2022 2027   TPN ADULT (ION)     PRN Meds: sodium chloride, acetaminophen, fentaNYL (SUBLIMAZE) injection, haloperidol lactate, hydrALAZINE, levalbuterol, metoprolol tartrate, ondansetron (ZOFRAN) IV, mouth rinse, mouth rinse, polyethylene glycol, sodium chloride flush, white petrolatum   Vital Signs    Vitals:   01/19/22 0300 01/19/22 0342 01/19/22 0814 01/19/22 0831  BP: 130/68 121/62  (!) 160/63  Pulse: (!) 108 99 89 85  Resp:  (!) 29 (!) 22 20  Temp:    98.9 F (37.2 C)  TempSrc:    Axillary  SpO2: 100% 100% 99% 96%   Weight:      Height:        Intake/Output Summary (Last 24 hours) at 01/19/2022 0944 Last data filed at 01/19/2022 0243 Gross per 24 hour  Intake --  Output 3200 ml  Net -3200 ml      02/01/2022    5:28 AM 01/17/2022    5:12 AM 01/15/2022    4:33 AM  Last 3 Weights  Weight (lbs) 166 lb 0.1 oz 163 lb 12.8 oz 153 lb 14.1 oz  Weight (kg) 75.3 kg 74.3 kg 69.8 kg      Telemetry    Atrial fibrillation with controlled ventricular response- Personally Reviewed  ECG    No new tracing- Personally Reviewed  Physical Exam  Appears weak and frail GEN: No acute distress.   Neck: 6-7 cm JVD Cardiac: Irregular, faint aortic ejection murmur, no diastolic murmurs, rubs, or gallops.  Respiratory: Bilateral rhonchi, but no rales or wheezes diminished breath sounds in bases bilaterally. GI: Soft, nontender, non-distended  MS: No edema; No deformity. Neuro:  Nonfocal  Psych: Normal affect   Labs    High Sensitivity Troponin:   Recent Labs  Lab 01/02/22 1023 01/02/22 1308 01/03/22 2119 01/04/22 0513 01/05/22 0502  TROPONINIHS 8 11 123* 131* 55*     Chemistry Recent Labs  Lab 01/14/22 0708 01/14/22 2037 01/16/22 0536 01/16/22 1854 01/17/22 0529 01/20/2022 0538 01/19/22 0645  NA 134*   < >  137   < > 139 138  138 141  K 3.7   < > 3.3*   < > 4.2 3.9  3.8 3.7  CL 102   < > 98   < > 99 100  99 100  CO2 24   < > 32   < > 33* 35*  32 35*  GLUCOSE 160*   < > 224*   < > 135* 130*  133* 102*  BUN 18   < > 18   < > '20 23  22 '$ 26*  CREATININE 0.70   < > 0.53   < > 0.49 0.50  0.48 0.62  CALCIUM 8.9   < > 8.6*   < > 8.9 9.0  9.1 9.2  MG 2.1   < > 1.7  --  2.2 1.8 1.8  PROT 5.3*  --  5.2*  --   --  5.6*  --   ALBUMIN 2.3*  --  2.8*  --   --  2.6*  2.6*  --   AST 33  --  39  --   --  33  --   ALT 27  --  23  --   --  23  --   ALKPHOS 80  --  73  --   --  86  --   BILITOT 1.2  --  1.1  --   --  1.1  --   GFRNONAA >60   < > >60   < > >60 >60  >60 >60  ANIONGAP 8   < > 7   < >  7 3*  7 6   < > = values in this interval not displayed.    Lipids  Recent Labs  Lab 01/30/2022 0538  TRIG 89    Hematology Recent Labs  Lab 01/17/22 1020 01/08/2022 0538 01/19/22 0645  WBC 11.0* 11.4* 11.0*  RBC 2.38* 2.46* 2.50*  HGB 8.8* 8.3* 8.6*  HCT 26.1* 25.5* 26.0*  MCV 109.7* 103.7* 104.0*  MCH 37.0* 33.7 34.4*  MCHC 33.7 32.5 33.1  RDW 16.2* 15.8* 15.5  PLT 235 245 240   Thyroid No results for input(s): "TSH", "FREET4" in the last 168 hours.  BNPNo results for input(s): "BNP", "PROBNP" in the last 168 hours.  DDimer No results for input(s): "DDIMER" in the last 168 hours.   Radiology    DG CHEST PORT 1 VIEW  Result Date: 01/19/2022 CLINICAL DATA:  Dyspnea EXAM: PORTABLE CHEST 1 VIEW COMPARISON:  01/10/2022 FINDINGS: No significant change in AP portable examination. Large right pleural effusion and associated atelectasis or consolidation. The left lung is normally aerated. Heart and mediastinum unremarkable. Left upper extremity PICC. IMPRESSION: No significant change in AP portable examination. Large right pleural effusion and associated atelectasis or consolidation. The left lung is normally aerated. No new airspace opacity. Electronically Signed   By: Delanna Ahmadi M.D.   On: 01/19/2022 08:47   DG Chest Port 1 View  Result Date: 01/17/2022 CLINICAL DATA:  Acute respiratory distress. EXAM: PORTABLE CHEST 1 VIEW COMPARISON:  January 18, 2022 (10:04 a.m.) FINDINGS: There is stable left-sided PICC line positioning. The cardiac silhouette is enlarged and unchanged in size. There is stable opacification of the mid and lower right lung. The left lung is clear. No pneumothorax is identified. The visualized skeletal structures are unremarkable. IMPRESSION: Stable opacification of mid and lower right lung, likely representing a combination of pleural effusion and atelectasis/infiltrate. Electronically Signed   By: Hoover Browns  [place] M.D.   On: 01/23/2022 19:31   ECHOCARDIOGRAM  LIMITED  Result Date: 01/17/2022    ECHOCARDIOGRAM LIMITED REPORT   Patient Name:   [name] Date of Exam: 01/26/2022 Medical Rec #:  [id number]       Height:       60.0 in Accession #:    [id number]      Weight:       166.0 lb Date of Birth:  02-27-1941       BSA:          1.725 m Patient Age:    81 years        BP:           161/70 mmHg Patient Gender: F               HR:           78 bpm. Exam Location:  Inpatient Procedure: Limited Echo, Intracardiac Opacification Agent, Limited Color Doppler            and Cardiac Doppler Indications:    Congestive Heart Failure I50.9  History:        Patient has prior history of Echocardiogram examinations, most                 recent 01/04/2022. Arrythmias:Atrial Fibrillation; Risk                 Factors:GERD and Hypertension.  Sonographer:    Bernadene Person RDCS Referring Phys: [place]  1. Left ventricular ejection fraction, by estimation, is 60 to 65%. Left ventricular ejection fraction by 2D MOD biplane is 60.6 %. The left ventricle has normal function. The left ventricle has no regional wall motion abnormalities. There is mild left ventricular hypertrophy. Left ventricular diastolic function could not be evaluated.  2. Right ventricular systolic function is moderately reduced. The right ventricular size is normal. There is severely elevated pulmonary artery systolic pressure. The estimated right ventricular systolic pressure is 23.5 mmHg.  3. The mitral valve is grossly normal. Trivial mitral valve regurgitation.  4. The aortic valve is tricuspid. Aortic valve regurgitation is mild to moderate. Aortic valve sclerosis is present, with no evidence of aortic valve stenosis. Aortic regurgitation PHT measures 227 msec.  5. The inferior vena cava is normal in size with <50% respiratory variability, suggesting right atrial pressure of 8 mmHg. Comparison(s): Changes from prior study are noted. 01/04/2022: LVEF 25-30%, Takatsubo-type wall motion  abnormalities. FINDINGS  Left Ventricle: Left ventricular ejection fraction, by estimation, is 60 to 65%. Left ventricular ejection fraction by 2D MOD biplane is 60.6 %. The left ventricle has normal function. The left ventricle has no regional wall motion abnormalities. The left ventricular internal cavity size was normal in size. There is mild left ventricular hypertrophy. Left ventricular diastolic function could not be evaluated. Left ventricular diastolic function could not be evaluated due to atrial fibrillation. Right Ventricle: The right ventricular size is normal. No increase in right ventricular wall thickness. Right ventricular systolic function is moderately reduced. There is severely elevated pulmonary artery systolic pressure. The tricuspid regurgitant velocity is 4.17 m/s, and with an assumed right atrial pressure of 8 mmHg, the estimated right ventricular systolic pressure is 36.1 mmHg. Mitral Valve: The mitral valve is grossly normal. Trivial mitral valve regurgitation. Tricuspid Valve: The tricuspid valve is grossly normal. Tricuspid valve regurgitation is mild. Aortic Valve: The aortic valve is tricuspid. Aortic valve regurgitation is mild to moderate. Aortic regurgitation PHT measures  227 msec. Aortic valve sclerosis is present, with no evidence of aortic valve stenosis. Pulmonic Valve: The pulmonic valve was grossly normal. Pulmonic valve regurgitation is trivial. Aorta: The aortic root and ascending aorta are structurally normal, with no evidence of dilitation. Venous: The inferior vena cava is normal in size with less than 50% respiratory variability, suggesting right atrial pressure of 8 mmHg. LEFT VENTRICLE PLAX 2D                        Biplane EF (MOD) LVIDd:         4.50 cm         LV Biplane EF:   Left LVIDs:         2.80 cm                          ventricular LV PW:         1.10 cm                          ejection LV IVS:        1.10 cm                          fraction by LVOT diam:      2.00 cm                          2D MOD LV SV:         62                               biplane is LV SV Index:   36                               60.6 %. LVOT Area:     3.14 cm  LV Volumes (MOD) LV vol d, MOD    53.2 ml A2C: LV vol d, MOD    41.1 ml A4C: LV vol s, MOD    19.5 ml A2C: LV vol s, MOD    17.1 ml A4C: LV SV MOD A2C:   33.7 ml LV SV MOD A4C:   41.1 ml LV SV MOD BP:    30.0 ml RIGHT VENTRICLE TAPSE (M-mode): 1.2 cm LEFT ATRIUM         Index LA diam:    3.90 cm 2.26 cm/m  AORTIC VALVE LVOT Vmax:   110.00 cm/s LVOT Vmean:  72.700 cm/s LVOT VTI:    0.196 m AI PHT:      227 msec  AORTA Ao Root diam: 2.90 cm TRICUSPID VALVE TR Peak grad:   69.6 mmHg TR Vmax:        417.00 cm/s  SHUNTS Systemic VTI:  0.20 m Systemic Diam: 2.00 cm Lyman Bishop MD Electronically signed by Lyman Bishop MD Signature Date/Time: 01/17/2022/4:21:18 PM    Final    DG CHEST PORT 1 VIEW  Result Date: 01/24/2022 CLINICAL DATA:  Respiratory distress, shortness of breath EXAM: PORTABLE CHEST 1 VIEW COMPARISON:  01/17/2022 FINDINGS: Persistent right pleural effusion with adjacent atelectasis/consolidation. Change in appearance may be due to differences in positioning. Persistent probable mild atelectasis at the left lung base. Similar partially obscured cardiomediastinal contours. Left subclavian line  is unchanged. IMPRESSION: Persistent right pleural effusion and adjacent atelectasis/consolidation. Electronically Signed   By: Macy Mis M.D.   On: 01/17/2022 10:22   DG CHEST PORT 1 VIEW  Result Date: 01/17/2022 CLINICAL DATA:  Shortness of breath. EXAM: PORTABLE CHEST 1 VIEW COMPARISON:  One-view chest x-ray 01/07/2022 FINDINGS: Patient has been extubated. The heart is enlarged. Right pleural effusion and right-sided airspace disease has increased. Minimal atelectasis at the left base is stable. Surgical clips are again noted in the right breast or axilla. IMPRESSION: 1. Increasing right pleural effusion and right-sided  airspace disease. While this likely reflects atelectasis, infection is not excluded. 2. Stable cardiomegaly without failure. Electronically Signed   By: San Morelle M.D.   On: 01/17/2022 11:39    Cardiac Studies   ECHO 01/04/2022    1. All mid-to-apical LV segments are akinetic. The basal LV segments  demonstrate normal contractility. Findings most consistent with Takostubo  cardiomyopathy . No LV thrombus visualized.   2. Left ventricular ejection fraction, by estimation, is 25 to 30%. The  left ventricle has severely decreased function. The left ventricle  demonstrates regional wall motion abnormalities (see scoring  diagram/findings for description). Diastolic function   indeterminant due to Afib.   3. Right ventricular systolic function is mildly reduced. The right  ventricular size is mildly enlarged. There is mildly elevated pulmonary  artery systolic pressure. The estimated right ventricular systolic  pressure is 58.5 mmHg.   4. Left atrial size was mildly dilated.   5. Right atrial size was moderately dilated.   6. The mitral valve is grossly normal. Moderate mitral valve  regurgitation.   7. Tricuspid valve regurgitation is mild to moderate.   8. The aortic valve is tricuspid. There is mild calcification of the  aortic valve. There is mild thickening of the aortic valve. Aortic valve  regurgitation is mild to moderate. Aortic valve sclerosis/calcification is  present, without any evidence of  aortic stenosis.   Comparison(s): Compared to prior TTE, the LVEF has dropped to 25%  (previously 55%). Mitral regurgitation now appears moderate (likely  functional) which was previously trivial.   Patient Profile     81 y.o. 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  Starting full liquids today.  Assessment & Plan    Acute on chronic HF: Echo shows full recovery of left ventricular systolic function.  Good response to diuretics, but still has clinical evidence of hypervolemia.  Prior to acute illness her dry weight seem to be around 140 pounds. Continue IV diuretics. AFib: on amio IV for rate control and IV heparin.  Intake of food remains poor and will impact absorption of oral medication such as Xarelto and amiodarone.  Will switch to oral beta blockers, xarelto once reliably taking PO. Sepsis: resolved. Initial admission for pneumonia. S/P partial colectomy: intestinal reanastomosis during second surgery on 08/09.  HTN: oral beta blockers and resume ARB when able. Asthma: prefer bisoprolol for AF rate control due to history of reactive airway disease.  PAH: This is likely to be multifactorial, but at least in part is due to diastolic left heart failure.  Estimated systolic PA pressure almost 80 mmHg on current echocardiogram.  The echocardiogram in 2021 reported estimated PA pressure 36  mmHg, up to 50 mmHg on echo in 2022.  Estimation with echo is fraught with overestimation error due to the irregular heart rhythm, but the pulmonary pressure is clearly elevated.  Right ventricular function has deteriorated.  ABG during this admission c/w chronic hypoventilation.  Suspect her lung disease is more complex than just asthma.  On chronic anticoagulation so thromboembolic disease is unlikely.  Unable to really evaluate pulmonary function at this time due to acute illness.        For questions or updates, please contact [place] Please consult [email or web address] for contact info under        Signed, Sanda Klein, MD  01/19/2022, 9:44 AM

## 2022-01-19 NOTE — Progress Notes (Signed)
1 Day Post-Op   Subjective/Chief Complaint: Feeling better today.  Respiratory issues seem to be improving and she is coughing some stuff up this morning.  Tolerating FLD with no issues.  + flatus, no BM yet.    Objective: Vital signs in last 24 hours: Temp:  [97.8 F (36.6 C)-100.7 F (38.2 C)] 98.9 F (37.2 C) (08/15 0831) Pulse Rate:  [79-118] 85 (08/15 0831) Resp:  [19-37] 20 (08/15 0831) BP: (91-172)/(43-99) 160/63 (08/15 0831) SpO2:  [75 %-100 %] 96 % (08/15 0831) FiO2 (%):  [36 %-100 %] 36 % (08/15 0814) Last BM Date : 12/31/21  Intake/Output from previous day: 08/14 0701 - 08/15 0700 In: 0  Out: 3200 [Urine:3200] Intake/Output this shift: Total I/O In: 1463.2 [I.V.:1426.2; IV Piggyback:37] Out: -   PE: GI: Soft, mild distention, appropriate TTP greatest along midline incision. +BS, wound is clean and packed   Lab Results:  Recent Labs    01/26/2022 0538 01/19/22 0645  WBC 11.4* 11.0*  HGB 8.3* 8.6*  HCT 25.5* 26.0*  PLT 245 240   BMET Recent Labs    01/14/2022 0538 01/19/22 0645  NA 138  138 141  K 3.9  3.8 3.7  CL 100  99 100  CO2 35*  32 35*  GLUCOSE 130*  133* 102*  BUN 23  22 26*  CREATININE 0.50  0.48 0.62  CALCIUM 9.0  9.1 9.2   PT/INR No results for input(s): "LABPROT", "INR" in the last 72 hours. ABG Recent Labs    01/17/22 1818 01/22/2022 2107 01/19/22 0533  PHART 7.43  --   --   HCO3 35.8* 30.0* 28.5*    Studies/Results: DG CHEST PORT 1 VIEW  Result Date: 01/19/2022 CLINICAL DATA:  Dyspnea EXAM: PORTABLE CHEST 1 VIEW COMPARISON:  01/22/2022 FINDINGS: No significant change in AP portable examination. Large right pleural effusion and associated atelectasis or consolidation. The left lung is normally aerated. Heart and mediastinum unremarkable. Left upper extremity PICC. IMPRESSION: No significant change in AP portable examination. Large right pleural effusion and associated atelectasis or consolidation. The left lung is normally  aerated. No new airspace opacity. Electronically Signed   By: Delanna Ahmadi M.D.   On: 01/19/2022 08:47   DG Chest Port 1 View  Result Date: 02/03/2022 CLINICAL DATA:  Acute respiratory distress. EXAM: PORTABLE CHEST 1 VIEW COMPARISON:  January 18, 2022 (10:04 a.m.) FINDINGS: There is stable left-sided PICC line positioning. The cardiac silhouette is enlarged and unchanged in size. There is stable opacification of the mid and lower right lung. The left lung is clear. No pneumothorax is identified. The visualized skeletal structures are unremarkable. IMPRESSION: Stable opacification of mid and lower right lung, likely representing a combination of pleural effusion and atelectasis/infiltrate. Electronically Signed   By: Virgina Norfolk M.D.   On: 01/26/2022 19:31   ECHOCARDIOGRAM LIMITED  Result Date: 01/14/2022    ECHOCARDIOGRAM LIMITED REPORT   Patient Name:   [name] Date of Exam: 01/24/2022 Medical Rec #:  [id number]       Height:       60.0 in Accession #:    [id number]      Weight:       166.0 lb Date of Birth:  23-Jul-1940       BSA:          1.725 m Patient Age:    28 years        BP:           161/70  mmHg Patient Gender: F               HR:           78 bpm. Exam Location:  Inpatient Procedure: Limited Echo, Intracardiac Opacification Agent, Limited Color Doppler            and Cardiac Doppler Indications:    Congestive Heart Failure I50.9  History:        Patient has prior history of Echocardiogram examinations, most                 recent 01/04/2022. Arrythmias:Atrial Fibrillation; Risk                 Factors:GERD and Hypertension.  Sonographer:    Bernadene Person RDCS Referring Phys: [place]  1. Left ventricular ejection fraction, by estimation, is 60 to 65%. Left ventricular ejection fraction by 2D MOD biplane is 60.6 %. The left ventricle has normal function. The left ventricle has no regional wall motion abnormalities. There is mild left ventricular hypertrophy. Left  ventricular diastolic function could not be evaluated.  2. Right ventricular systolic function is moderately reduced. The right ventricular size is normal. There is severely elevated pulmonary artery systolic pressure. The estimated right ventricular systolic pressure is 22.2 mmHg.  3. The mitral valve is grossly normal. Trivial mitral valve regurgitation.  4. The aortic valve is tricuspid. Aortic valve regurgitation is mild to moderate. Aortic valve sclerosis is present, with no evidence of aortic valve stenosis. Aortic regurgitation PHT measures 227 msec.  5. The inferior vena cava is normal in size with <50% respiratory variability, suggesting right atrial pressure of 8 mmHg. Comparison(s): Changes from prior study are noted. 01/04/2022: LVEF 25-30%, Takatsubo-type wall motion abnormalities. FINDINGS  Left Ventricle: Left ventricular ejection fraction, by estimation, is 60 to 65%. Left ventricular ejection fraction by 2D MOD biplane is 60.6 %. The left ventricle has normal function. The left ventricle has no regional wall motion abnormalities. The left ventricular internal cavity size was normal in size. There is mild left ventricular hypertrophy. Left ventricular diastolic function could not be evaluated. Left ventricular diastolic function could not be evaluated due to atrial fibrillation. Right Ventricle: The right ventricular size is normal. No increase in right ventricular wall thickness. Right ventricular systolic function is moderately reduced. There is severely elevated pulmonary artery systolic pressure. The tricuspid regurgitant velocity is 4.17 m/s, and with an assumed right atrial pressure of 8 mmHg, the estimated right ventricular systolic pressure is 97.9 mmHg. Mitral Valve: The mitral valve is grossly normal. Trivial mitral valve regurgitation. Tricuspid Valve: The tricuspid valve is grossly normal. Tricuspid valve regurgitation is mild. Aortic Valve: The aortic valve is tricuspid. Aortic valve  regurgitation is mild to moderate. Aortic regurgitation PHT measures 227 msec. Aortic valve sclerosis is present, with no evidence of aortic valve stenosis. Pulmonic Valve: The pulmonic valve was grossly normal. Pulmonic valve regurgitation is trivial. Aorta: The aortic root and ascending aorta are structurally normal, with no evidence of dilitation. Venous: The inferior vena cava is normal in size with less than 50% respiratory variability, suggesting right atrial pressure of 8 mmHg. LEFT VENTRICLE PLAX 2D                        Biplane EF (MOD) LVIDd:         4.50 cm         LV Biplane EF:   Left LVIDs:  2.80 cm                          ventricular LV PW:         1.10 cm                          ejection LV IVS:        1.10 cm                          fraction by LVOT diam:     2.00 cm                          2D MOD LV SV:         62                               biplane is LV SV Index:   36                               60.6 %. LVOT Area:     3.14 cm  LV Volumes (MOD) LV vol d, MOD    53.2 ml A2C: LV vol d, MOD    41.1 ml A4C: LV vol s, MOD    19.5 ml A2C: LV vol s, MOD    17.1 ml A4C: LV SV MOD A2C:   33.7 ml LV SV MOD A4C:   41.1 ml LV SV MOD BP:    30.0 ml RIGHT VENTRICLE TAPSE (M-mode): 1.2 cm LEFT ATRIUM         Index LA diam:    3.90 cm 2.26 cm/m  AORTIC VALVE LVOT Vmax:   110.00 cm/s LVOT Vmean:  72.700 cm/s LVOT VTI:    0.196 m AI PHT:      227 msec  AORTA Ao Root diam: 2.90 cm TRICUSPID VALVE TR Peak grad:   69.6 mmHg TR Vmax:        417.00 cm/s  SHUNTS Systemic VTI:  0.20 m Systemic Diam: 2.00 cm Lyman Bishop MD Electronically signed by Lyman Bishop MD Signature Date/Time: 01/12/2022/4:21:18 PM    Final    DG CHEST PORT 1 VIEW  Result Date: 01/15/2022 CLINICAL DATA:  Respiratory distress, shortness of breath EXAM: PORTABLE CHEST 1 VIEW COMPARISON:  01/17/2022 FINDINGS: Persistent right pleural effusion with adjacent atelectasis/consolidation. Change in appearance may be due to differences  in positioning. Persistent probable mild atelectasis at the left lung base. Similar partially obscured cardiomediastinal contours. Left subclavian line is unchanged. IMPRESSION: Persistent right pleural effusion and adjacent atelectasis/consolidation. Electronically Signed   By: Macy Mis M.D.   On: 01/12/2022 10:22    Anti-infectives: Anti-infectives (From admission, onward)    Start     Dose/Rate Route Frequency Ordered Stop   01/29/2022 1400  micafungin (MYCAMINE) 100 mg in sodium chloride 0.9 % 100 mL IVPB  Status:  Discontinued        100 mg 105 mL/hr over 1 Hours Intravenous Every 24 hours 01/26/2022 1302 01/15/22 1211   01/10/22 1515  piperacillin-tazobactam (ZOSYN) IVPB 3.375 g        3.375 g 12.5 mL/hr over 240 Minutes Intravenous Every 8 hours 01/10/22 1427 01/07/2022 2359   01/04/22 2000  vancomycin (VANCOCIN) IVPB  1000 mg/200 mL premix  Status:  Discontinued        1,000 mg 200 mL/hr over 60 Minutes Intravenous Every 24 hours 01/03/22 1910 01/05/22 1321   01/03/22 2200  ceFEPIme (MAXIPIME) 2 g in sodium chloride 0.9 % 100 mL IVPB        2 g 200 mL/hr over 30 Minutes Intravenous Every 12 hours 01/03/22 1910 01/08/22 2209   01/03/22 1930  vancomycin (VANCOREADY) IVPB 1250 mg/250 mL        1,250 mg 166.7 mL/hr over 90 Minutes Intravenous  Once 01/03/22 1910 01/03/22 2219   01/02/22 2100  azithromycin (ZITHROMAX) 500 mg in sodium chloride 0.9 % 250 mL IVPB        500 mg 250 mL/hr  Intravenous Every 24 hours 12/14/2021 2248 01/06/22 2149   01/02/22 2000  cefTRIAXone (ROCEPHIN) 2 g in sodium chloride 0.9 % 100 mL IVPB  Status:  Discontinued        2 g 200 mL/hr over 30 Minutes Intravenous Every 24 hours 12/05/2021 2248 01/03/22 1733   12/10/2021 2030  cefTRIAXone (ROCEPHIN) 1 g in sodium chloride 0.9 % 100 mL IVPB        1 g 200 mL/hr over 30 Minutes Intravenous  Once 12/22/2021 2025 01/03/2022 2141   12/10/2021 2030  azithromycin (ZITHROMAX) 500 mg in sodium chloride 0.9 % 250 mL IVPB         500 mg 250 mL/hr over 60 Minutes Intravenous  Once 12/24/2021 2025 12/30/2021 2312       Assessment/Plan: POD 8/6 S/p exploratory laparotomy,  R colectomy, abthera placement 01/19/2022 Dr. Rosendo Gros S/p exploratory laparotomy, intestinal anastomosis, abdominal closure 01/08/2022 Dr. Rosendo Gros  for ischemic bowel with perforation - afebrile - WBC stable 11.0 - wean to DC TNA -adv to soft diet -miralax daily - wet-to-dry dressing to midline BID.  -mobilize/pulm toilet.  Therapies recommend CIR -cont zosyn for 5 days post op, stop date 8/14   FEN: wean TNA to off/soft/ensure ID: [email or web address]; tentatively plan for 5d post-op (stop date 8/14 at 2359) VTE: SCD's, hep gtt  Foley: purewick Dispo: progressive     Below per TRH -- Septic shock due to above Cardiac arrest 7/30, 7 mins CPR Hx cardiomyopathy COPD Afib, permanent   LOS: 18 days    Henreitta Cea, [facility] 01/19/2022, 10:40 AM Please see Amion for pager number during day hours 7:00am-4:30pm

## 2022-01-19 NOTE — Progress Notes (Signed)
PROGRESS NOTE    [name]  BTD:[id number] DOB: [birth year] DOA: 12/27/2021 PCP: [facility] [place]   Brief Narrative:  This is a 81 year old female admitted for community-acquired pneumonia and respiratory failure and concurrent cardiac arrest.  Patient had a parapneumonic effusion requiring chest tube.  Developed ischemic colitis was taken to the OR for a laparotomy with worsening shock/confusion. Patient was extubated on 01/14/2022 and now doing well.  7/28 admit, Ceftriaxone/Azithro 7/29 last dose xeralto  7/30 IHCA x 7 min, Ceftriaxone>Cefepime 7/31 Chest tube placed, heparin gtt started 8/1 patient remove chest tube.  Head CT obtained >negative.  Severely agitated overnight.  Vancomycin stopped.  Azithromycin stopped. 8/6 Transferred to [facility] 8/7 Called CCM back for abd, Distention , hypotension and a fib with RVR, plan is for  urgent OR pending goals of care discussion then ICU post op if husband is in agreement. 8/10 Abdomen closed yesterday, SBT/SAT today 8/11 extubated yesterday, doing well on [place], up OOB, feeling alright 8/13 small BM/flatus -> clears per Gen Sx (continue TPN for nutrition/volume) worsening respiratory status late afternoon - xray shows R effusion - lasix and thoracentesis ordered - PCCM asked to evaluate given profoundly poor inspiratory efforts given recent intubation 8/14 responded well to diuresis, continue today. Unable to complete thora as no evidence of pleural fluid but significant lung trapping noted per [facility] 8/15 overnight requiring BIPAP, improving and progressing.  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   Hypotension   A-fib (HCC)   Lactic acid increased   Cardiac arrest ([place])   Acute respiratory failure with hypoxia (HCC)   Pneumonia of right lower lobe due to infectious organism   Takotsubo cardiomyopathy    Ischemic colitis ([place])   Septic shock ([place])   Septic shock Ischemic colitis with perforation.  Concurrent CAP with parapneumonic effusion Questionable concurrent cardiogenic shock:  -Known history of Takotsubo cardiomyopathy -EF 25 to 30%, now ersolved -Status post ex lap colectomy 01/12/2022 return on 01/26/2022 for abdominal closure -No flatus or stool at this time, continue medical management per surgery -Continue TPN - start clears today - will transition off TPN as PO is tolerated -Completed  Zosyn 02/03/2022 -Blood cultures negative -Shock resolved - off pressors   Acute hypoxic respiratory failure due to right sided CAP with parapneumonic effusion, COPD without acute exacerbation  - Extubated 01/14/2022  - Continue PT OT mobilization, flutter, incentive spirometry - never received incentive/flutter - reordered today.  - Likely extremely poor NIF (only pulling 200cc on IS) - ABG pending, CXR shows R pleural effusion - request IR evaluation for thoracentesis. PCCM called to assist given recent respiratory failure and high risk for respiratory compromise. - Neb triple therapy - COPD not in acute exacerbation, no indication for steroids at this time    SP PEA arrest 2/2 respiratory distress/ hypoxia Atrial fibrillation with RVR Takotsubo cardiomyopathy EF 25-30% on 7/31 - Heparin, amiodarone drip ongoing - Cardiomyopathy resolving, she has systolic recovery, EF 10%, without evidence of wall motion abnormalities.  Moderate protein caloric malnutrition  Hypokalemia Hypomagnesemia Possible re-feeding syndrome - Complicated by n.p.o. status postoperatively - advance diet as above - Follow clinically, labs improving - Electrolytes currently being managed by TPN   DVT prophylaxis: Heparin drip Code Status: Full Family Communication: None present  Status is: Inpatient  Dispo: The patient is from: Home  Anticipated d/c is to: To be determined              Anticipated d/c  date is: 72+ hours              Patient currently not medically stable for discharge  Consultants:  General surgery, PCCM, cardiology  Antimicrobials:  Zosyn ongoing -completed  01/28/2022 Micafungin discontinued Previously completed short course of azithromycin and cefepime.  Subjective: Much more relaxed breathing this morning.   Objective: Vitals:   01/19/22 0814 01/19/22 0831 01/19/22 1041 01/19/22 1151  BP:  (!) 160/63 (!) 150/89 (!) 154/89  Pulse: 89 85 91 96  Resp: (!) '22 20  18  '$ Temp:  98.9 F (37.2 C)  (!) 97.4 F (36.3 C)  TempSrc:  Axillary  Oral  SpO2: 99% 96% 100% 100%  Weight:      Height:        Intake/Output Summary (Last 24 hours) at 01/19/2022 1307 Last data filed at 01/19/2022 1039 Gross per 24 hour  Intake 1583.2 ml  Output 2700 ml  Net -1116.8 ml    Filed Weights   01/15/22 0433 01/17/22 0512 01/12/2022 0528  Weight: 69.8 kg 74.3 kg 75.3 kg    Examination:  General:  Pleasantly resting in bed, No acute distress. HEENT:  Normocephalic atraumatic.  Sclerae nonicteric, noninjected.  Extraocular movements intact bilaterally. Neck:  Without mass or deformity.  Trachea is midline. Lungs: Scant end expiratory wheeze right lung field diffusely, left lung field without overt wheeze rales or rhonchi Heart:  Regular rate and rhythm.  Without murmurs, rubs, or gallops. Abdomen: Large abdominal bandage clean dry intact. Extremities/GU: Pure wick in place, limbs without cyanosis, clubbing, 2+ pitting edema bilateral lower extremities. Vascular:  Dorsalis pedis and posterior tibial pulses palpable bilaterally. Skin:  Warm and dry, no erythema  Data Reviewed: I have personally reviewed following labs and imaging studies  CBC: Recent Labs  Lab 01/16/2022 0524 02/03/2022 1101 01/14/22 0708 01/14/22 0850 01/17/22 1020 01/16/2022 0538 01/19/22 0645  WBC 11.2*   < > 18.5* 18.3* 11.0* 11.4* 11.0*  NEUTROABS 9.7*  --  16.3*  --  9.3* 9.6* 8.8*  HGB 6.9*   < >  11.0* 10.1* 8.8* 8.3* 8.6*  HCT 20.9*   < > 32.4* 30.2* 26.1* 25.5* 26.0*  MCV 107.7*   < > 102.5* 105.2* 109.7* 103.7* 104.0*  PLT 143*   < > 220 228 235 245 240   < > = values in this interval not displayed.    Basic Metabolic Panel: Recent Labs  Lab 01/14/22 0708 01/14/22 2037 01/15/22 0529 01/16/22 0536 01/16/22 1854 01/17/22 0529 01/10/2022 0538 01/19/22 0645  NA 134*   < > 138 137 137 139 138  138 141  K 3.7   < > 3.7 3.3* 4.1 4.2 3.9  3.8 3.7  CL 102   < > 97* 98 95* 99 100  99 100  CO2 24   < > 33* 32 33* 33* 35*  32 35*  GLUCOSE 160*   < > 139* 224* 156* 135* 130*  133* 102*  BUN 18   < > '19 18 18 20 23  22 '$ 26*  CREATININE 0.70   < > 0.58 0.53 0.54 0.49 0.50  0.48 0.62  CALCIUM 8.9   < > 9.3 8.6* 8.7* 8.9 9.0  9.1 9.2  MG 2.1  --  1.5* 1.7  --  2.2 1.8 1.8  PHOS 3.3  --  2.1* 3.3  --  2.9 3.3  3.2  --    < > = values in this interval not displayed.    GFR: Estimated Creatinine Clearance: 52.5 mL/min (by C-G formula based on SCr of 0.62 mg/dL). Liver Function Tests: Recent Labs  Lab 01/05/2022 0524 01/14/22 0708 01/16/22 0536 01/17/2022 0538  AST 24 33 39 33  ALT '27 27 23 23  '$ ALKPHOS 60 80 73 86  BILITOT 1.2 1.2 1.1 1.1  PROT 4.5* 5.3* 5.2* 5.6*  ALBUMIN 2.1* 2.3* 2.8* 2.6*  2.6*    No results for input(s): "LIPASE", "AMYLASE" in the last 168 hours. No results for input(s): "AMMONIA" in the last 168 hours. Coagulation Profile: No results for input(s): "INR", "PROTIME" in the last 168 hours. Cardiac Enzymes: No results for input(s): "CKTOTAL", "CKMB", "CKMBINDEX", "TROPONINI" in the last 168 hours. BNP (last 3 results) No results for input(s): "PROBNP" in the last 8760 hours. HbA1C: No results for input(s): "HGBA1C" in the last 72 hours. CBG: Recent Labs  Lab 01/16/22 1605 01/16/22 2014 01/16/22 2304 01/17/22 1221 01/17/22 2317  GLUCAP 115* 113* 119* 157* 120*    Lipid Profile: Recent Labs    02/01/2022 0538  TRIG 89    Thyroid  Function Tests: No results for input(s): "TSH", "T4TOTAL", "FREET4", "T3FREE", "THYROIDAB" in the last 72 hours. Anemia Panel: No results for input(s): "VITAMINB12", "FOLATE", "FERRITIN", "TIBC", "IRON", "RETICCTPCT" in the last 72 hours. Sepsis Labs: Recent Labs  Lab 01/15/2022 2255  LATICACIDVEN 1.3     No results found for this or any previous visit (from the past 240 hour(s)).       Radiology Studies: DG CHEST PORT 1 VIEW  Result Date: 01/19/2022 CLINICAL DATA:  Dyspnea EXAM: PORTABLE CHEST 1 VIEW COMPARISON:  01/29/2022 FINDINGS: No significant change in AP portable examination. Large right pleural effusion and associated atelectasis or consolidation. The left lung is normally aerated. Heart and mediastinum unremarkable. Left upper extremity PICC. IMPRESSION: No significant change in AP portable examination. Large right pleural effusion and associated atelectasis or consolidation. The left lung is normally aerated. No new airspace opacity. Electronically Signed   By: Delanna Ahmadi M.D.   On: 01/19/2022 08:47   DG Chest Port 1 View  Result Date: 01/06/2022 CLINICAL DATA:  Acute respiratory distress. EXAM: PORTABLE CHEST 1 VIEW COMPARISON:  January 18, 2022 (10:04 a.m.) FINDINGS: There is stable left-sided PICC line positioning. The cardiac silhouette is enlarged and unchanged in size. There is stable opacification of the mid and lower right lung. The left lung is clear. No pneumothorax is identified. The visualized skeletal structures are unremarkable. IMPRESSION: Stable opacification of mid and lower right lung, likely representing a combination of pleural effusion and atelectasis/infiltrate. Electronically Signed   By: Virgina Norfolk M.D.   On: 01/24/2022 19:31   ECHOCARDIOGRAM LIMITED  Result Date: 01/06/2022    ECHOCARDIOGRAM LIMITED REPORT   Patient Name:   JERMEKA SCHLOTTERBECK Date of Exam: 02/03/2022 Medical Rec #:  [id number]       Height:       60.0 in Accession #:    [id number]       Weight:       166.0 lb Date of Birth:  10/30/1940       BSA:          1.725 m Patient Age:    13 years        BP:           161/70 mmHg Patient Gender: F  HR:           78 bpm. Exam Location:  Inpatient Procedure: Limited Echo, Intracardiac Opacification Agent, Limited Color Doppler            and Cardiac Doppler Indications:    Congestive Heart Failure I50.9  History:        Patient has prior history of Echocardiogram examinations, most                 recent 01/04/2022. Arrythmias:Atrial Fibrillation; Risk                 Factors:GERD and Hypertension.  Sonographer:    Bernadene Person RDCS Referring Phys: [place]  1. Left ventricular ejection fraction, by estimation, is 60 to 65%. Left ventricular ejection fraction by 2D MOD biplane is 60.6 %. The left ventricle has normal function. The left ventricle has no regional wall motion abnormalities. There is mild left ventricular hypertrophy. Left ventricular diastolic function could not be evaluated.  2. Right ventricular systolic function is moderately reduced. The right ventricular size is normal. There is severely elevated pulmonary artery systolic pressure. The estimated right ventricular systolic pressure is 50.3 mmHg.  3. The mitral valve is grossly normal. Trivial mitral valve regurgitation.  4. The aortic valve is tricuspid. Aortic valve regurgitation is mild to moderate. Aortic valve sclerosis is present, with no evidence of aortic valve stenosis. Aortic regurgitation PHT measures 227 msec.  5. The inferior vena cava is normal in size with <50% respiratory variability, suggesting right atrial pressure of 8 mmHg. Comparison(s): Changes from prior study are noted. 01/04/2022: LVEF 25-30%, Takatsubo-type wall motion abnormalities. FINDINGS  Left Ventricle: Left ventricular ejection fraction, by estimation, is 60 to 65%. Left ventricular ejection fraction by 2D MOD biplane is 60.6 %. The left ventricle has normal function. The  left ventricle has no regional wall motion abnormalities. The left ventricular internal cavity size was normal in size. There is mild left ventricular hypertrophy. Left ventricular diastolic function could not be evaluated. Left ventricular diastolic function could not be evaluated due to atrial fibrillation. Right Ventricle: The right ventricular size is normal. No increase in right ventricular wall thickness. Right ventricular systolic function is moderately reduced. There is severely elevated pulmonary artery systolic pressure. The tricuspid regurgitant velocity is 4.17 m/s, and with an assumed right atrial pressure of 8 mmHg, the estimated right ventricular systolic pressure is 54.6 mmHg. Mitral Valve: The mitral valve is grossly normal. Trivial mitral valve regurgitation. Tricuspid Valve: The tricuspid valve is grossly normal. Tricuspid valve regurgitation is mild. Aortic Valve: The aortic valve is tricuspid. Aortic valve regurgitation is mild to moderate. Aortic regurgitation PHT measures 227 msec. Aortic valve sclerosis is present, with no evidence of aortic valve stenosis. Pulmonic Valve: The pulmonic valve was grossly normal. Pulmonic valve regurgitation is trivial. Aorta: The aortic root and ascending aorta are structurally normal, with no evidence of dilitation. Venous: The inferior vena cava is normal in size with less than 50% respiratory variability, suggesting right atrial pressure of 8 mmHg. LEFT VENTRICLE PLAX 2D                        Biplane EF (MOD) LVIDd:         4.50 cm         LV Biplane EF:   Left LVIDs:         2.80 cm  ventricular LV PW:         1.10 cm                          ejection LV IVS:        1.10 cm                          fraction by LVOT diam:     2.00 cm                          2D MOD LV SV:         62                               biplane is LV SV Index:   36                               60.6 %. LVOT Area:     3.14 cm  LV Volumes (MOD) LV vol d, MOD     53.2 ml A2C: LV vol d, MOD    41.1 ml A4C: LV vol s, MOD    19.5 ml A2C: LV vol s, MOD    17.1 ml A4C: LV SV MOD A2C:   33.7 ml LV SV MOD A4C:   41.1 ml LV SV MOD BP:    30.0 ml RIGHT VENTRICLE TAPSE (M-mode): 1.2 cm LEFT ATRIUM         Index LA diam:    3.90 cm 2.26 cm/m  AORTIC VALVE LVOT Vmax:   110.00 cm/s LVOT Vmean:  72.700 cm/s LVOT VTI:    0.196 m AI PHT:      227 msec  AORTA Ao Root diam: 2.90 cm TRICUSPID VALVE TR Peak grad:   69.6 mmHg TR Vmax:        417.00 cm/s  SHUNTS Systemic VTI:  0.20 m Systemic Diam: 2.00 cm Lyman Bishop MD Electronically signed by Lyman Bishop MD Signature Date/Time: 01/15/2022/4:21:18 PM    Final    DG CHEST PORT 1 VIEW  Result Date: 01/30/2022 CLINICAL DATA:  Respiratory distress, shortness of breath EXAM: PORTABLE CHEST 1 VIEW COMPARISON:  01/17/2022 FINDINGS: Persistent right pleural effusion with adjacent atelectasis/consolidation. Change in appearance may be due to differences in positioning. Persistent probable mild atelectasis at the left lung base. Similar partially obscured cardiomediastinal contours. Left subclavian line is unchanged. IMPRESSION: Persistent right pleural effusion and adjacent atelectasis/consolidation. Electronically Signed   By: Macy Mis M.D.   On: 01/14/2022 10:22        Scheduled Meds:  arformoterol  15 mcg Nebulization BID   budesonide (PULMICORT) nebulizer solution  0.25 mg Nebulization BID   dextrose  50 mL Intravenous Once   feeding supplement  1 Container Oral BID BM   furosemide  40 mg Intravenous BID   mouth rinse  15 mL Mouth Rinse Q2H   mouth rinse  15 mL Mouth Rinse 4 times per day   pantoprazole (PROTONIX) IV  40 mg Intravenous Q24H   polyethylene glycol  17 g Oral Daily   revefenacin  175 mcg Nebulization Daily   sodium chloride HYPERTONIC  4 mL Nebulization Once   Continuous Infusions:  sodium chloride Stopped (01/12/22 1757)   sodium chloride 250 mL (01/12/2022 2030)  amiodarone 30 mg/hr (01/19/22  0945)   heparin 1,200 Units/hr (01/19/22 0945)   potassium chloride 10 mEq (01/19/22 1301)   TPN ADULT (ION) 55 mL/hr at 01/19/22 0945    LOS: 18 days   Time spent: 5mn  Synethia Endicott, DO [facility]  If 7PM-7AM, please contact night-coverage [email or web address]  01/19/2022, 1:07 PM

## 2022-01-19 NOTE — Progress Notes (Signed)
Intake documented this am includes the last 48 hours

## 2022-01-19 NOTE — Progress Notes (Signed)
Inpatient Rehab Admissions Coordinator:    Pt. Continues on TPN, not medically ready for CIR at this time. I will continue to follow for potential admit once medically ready.  Clemens Catholic, [place], [place] Admissions Coordinator  [phone] ([place]) [phone] (office)

## 2022-01-19 NOTE — Progress Notes (Signed)
Physical Therapy Treatment Patient Details Name: [name] MRN: [id number] DOB: [birth year] Today's Date: 01/19/2022   History of Present Illness 81 y.o. female admitted 12/10/2021 with CAP.  7/30 AMS, head CT negative for acute abnormality. Code blue 7/30, ROSC achieved after 7 min CPR, pt intubated. Chest tube insertion 7/31; pt removed CT 8/1. ETT 7/30-8/2. Continued AMS, hallucinations overnight 8/2; head CT 8/2 remains negative for acute abnormality. EEG 8/2 with no seizure activity. Improved cognition 8/5, but persistent tachycardia/afib with RVR. 8/6 ischemic colitis. 8/7 shock with intubation and to OR for ex lap. 8/9 abdomen closure. 8/10 extubation.  PMH includes afib, HTN, breast CA, melanoma, asthma, bilateral TKA, anxiety.    PT Comments    Pt received supine and agreeable to session with slow but steady progress towards acute goals. Pt needing min a for all bed mobility and transfers. Pt demonstrating improved strength with powering up to standing and ability to maintain static standing with light min assist as pt continued with some tendency for posterior lean. Pt able to pivot to recliner with min assist to steady and manage RW. Pt with fair tolerance for seated therex for increased ROM and LE strength. Plan to continue to progress ambulation in next session within pt tolerance. Pt continues to benefit from skilled PT services to progress toward functional mobility goals.   SpO2 >96% on 4L [place] during activity   Recommendations for follow up therapy are one component of a multi-disciplinary discharge planning process, led by the attending physician.  Recommendations may be updated based on patient status, additional functional criteria and insurance authorization.  Follow Up Recommendations  Acute inpatient rehab (3hours/day) Can patient physically be transported by private vehicle: No   Assistance Recommended at Discharge Frequent or constant Supervision/Assistance  Patient can  return home with the following Direct supervision/assist for medications management;Assistance with feeding;Assistance with cooking/housework;Direct supervision/assist for financial management;Assist for transportation;A lot of help with walking and/or transfers;A lot of help with bathing/dressing/bathroom   Equipment Recommendations  Rolling walker (2 wheels);BSC/3in1    Recommendations for Other Services       Precautions / Restrictions Precautions Precautions: Fall;Other (comment) Precaution Comments: watch HR Restrictions Weight Bearing Restrictions: No     Mobility  Bed Mobility Overal bed mobility: Needs Assistance Bed Mobility: Supine to Sit   Sidelying to sit: Min assist, HOB elevated       General bed mobility comments: minA for cues and assist with LLE progression toward R side of bed    Transfers Overall transfer level: Needs assistance Equipment used: Rolling walker (2 wheels) Transfers: Sit to/from Stand, Bed to chair/wheelchair/BSC Sit to Stand: Min assist, +2 safety/equipment Stand pivot transfers: Min assist, +2 physical assistance, +2 safety/equipment         General transfer comment: minA to powerup into standing from EOB, assist for stability in standing    Ambulation/Gait                   Stairs             Wheelchair Mobility    Modified Rankin (Stroke Patients Only)       Balance Overall balance assessment: Needs assistance Sitting-balance support: Feet supported Sitting balance-Leahy Scale: Fair Sitting balance - Comments: minguard sitting EOB, pt with 1x minor loos of balance able to self correct   Standing balance support: Bilateral upper extremity supported Standing balance-Leahy Scale: Poor Standing balance comment: heavy reliance on BUE support in standing  Cognition Arousal/Alertness: Awake/alert Behavior During Therapy: WFL for tasks assessed/performed Overall Cognitive  Status: Impaired/Different from baseline Area of Impairment: Attention, Memory, Following commands, Safety/judgement, Problem solving, Awareness                   Current Attention Level: Selective Memory: Decreased short-term memory Following Commands: Follows one step commands with increased time, Follows multi-step commands with increased time Safety/Judgement: Decreased awareness of safety, Decreased awareness of deficits Awareness: Emergent Problem Solving: Slow processing, Requires verbal cues General Comments: more appropriate during session and able to remain on task, cues for safety        Exercises General Exercises - Lower Extremity Long Arc Quad: AROM, Right, Left, 20 reps, Seated Hip Flexion/Marching: AROM, Right, Left, 10 reps, Seated, Standing Toe Raises: AROM, Right, Left, 10 reps, Seated Heel Raises: AROM, Right, Left, 10 reps, Seated Other Exercises Other Exercises: educated pt on importance of sitting in recliner and use of incentive spriometer, pt reaching 250    General Comments General comments (skin integrity, edema, etc.): pt's husband present during session and very encouraging towards pt      Pertinent Vitals/Pain Pain Assessment Pain Assessment: No/denies pain Pain Intervention(s): Monitored during session    Home Living                          Prior Function            PT Goals (current goals can now be found in the care plan section) Acute Rehab PT Goals PT Goal Formulation: With patient Time For Goal Achievement: 01/28/22    Frequency    Min 3X/week      PT Plan      Co-evaluation PT/OT/SLP Co-Evaluation/Treatment: Yes Reason for Co-Treatment: Complexity of the patient's impairments (multi-system involvement);For patient/therapist safety;To address functional/ADL transfers PT goals addressed during session: Mobility/safety with mobility;Proper use of DME;Balance OT goals addressed during session: ADL's and  self-care      AM-PAC PT "6 Clicks" Mobility   Outcome Measure  Help needed turning from your back to your side while in a flat bed without using bedrails?: A Lot Help needed moving from lying on your back to sitting on the side of a flat bed without using bedrails?: A Lot Help needed moving to and from a bed to a chair (including a wheelchair)?: A Lot Help needed standing up from a chair using your arms (e.g., wheelchair or bedside chair)?: A Lot Help needed to walk in hospital room?: Total Help needed climbing 3-5 steps with a railing? : Total 6 Click Score: 10    End of Session Equipment Utilized During Treatment: Oxygen Activity Tolerance: Patient tolerated treatment well Patient left: in chair;with chair alarm set;with call bell/phone within reach Nurse Communication: Mobility status PT Visit Diagnosis: Other abnormalities of gait and mobility (R26.89);Difficulty in walking, not elsewhere classified (R26.2);Muscle weakness (generalized) (M62.81)     Time: 0258-5277 PT Time Calculation (min) (ACUTE ONLY): 30 min  Charges:  $Therapeutic Activity: 8-22 mins                     Vikas Wegmann R. PTA Acute Rehabilitation Services Office: [place] 01/19/2022, 1:18 PM

## 2022-01-19 NOTE — Progress Notes (Signed)
NAME:  [name], MRN:  [id number], DOB:  08/26/1940, LOS: 37 ADMISSION DATE:  12/06/2021, CONSULTATION DATE:  01/12/2022 REFERRING MD:  Bonner Puna, CHIEF COMPLAINT:  Hypotension, abdominal distention    History of Present Illness:  81 yo female admited for CAP causing respiratory failure, had a cardiac arrest after admission and was moved to the ICU.  Chest tube placed for parapneumonic effusion, extubated on 8/2. Sent out of ICU on 8/5. 8/6 developed ischemic colitis initially treated conservatively but this morning we were called back 8/7 for worsening shock, confusion.  She is going to the OR today.   Pertinent  Medical History  Breast ca on letrozole Afib on [place] Hospital Events: Including procedures, antibiotic start and stop dates in addition to other pertinent events   7/28 admit, Ceftriaxone/Azithro 7/29 last dose xeralto  7/30 IHCA x 7 min, Ceftriaxone>Cefepime 7/31 Chest tube placed, heparin gtt started 8/1 patient remove chest tube.  Head CT obtained >negative.  Severely agitated overnight.  Vancomycin stopped.  Azithromycin stopped. 8/6 Transferred to [facility] [place] 8/7 Called CCM back for abd, Distention , hypotension and a fib with RVR, plan is for  urgent OR pending goals of care discussion then ICU post op if husband is in agreement. 8/10 Abdomen closed yesterday, SBT/SAT today 8/11 extubated yesterday, doing well on [place], up OOB, feeling alright 8/14 chest ultrasound shows atelectasis without significant right basilar effusion.  Interim History / Subjective:  Briefly needed bipap overnight. Says she coughed up a bunch of mucus this morning.  Looks better this morning Breathing less labored Has hoarse voice from intubation   Objective   Blood pressure (!) 150/89, pulse 91, temperature 98.9 F (37.2 C), temperature source Axillary, resp. rate 20, height 5' (1.524 m), weight 75.3 kg, SpO2 100 %.    FiO2 (%):  [36 %-100 %] 36 %   Intake/Output Summary  (Last 24 hours) at 01/19/2022 1046 Last data filed at 01/19/2022 1039 Gross per 24 hour  Intake 1583.2 ml  Output 3900 ml  Net -2316.8 ml   Filed Weights   01/15/22 0433 01/17/22 0512 01/22/2022 0528  Weight: 69.8 kg 74.3 kg 75.3 kg    Sitting up in bed Able to phonate better, hoarse voice Improved work of breathing Breath sounds diminished bilaterally, no wheezes or crackles   Resolved Hospital Problem list   Septic shock Hypoxic Respiratory Failure  Assessment & Plan:   Acute hypoxemic respiratory failure CAP with parapneumonic effusion on the right side s/p pigtail drainage (now removed) AF with RVR on amiodarone gtt 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  Severe protein calorie malnutrition on TPN Right basilar atelectasis and elevated right hemidiaphragm  Chest xray reviewed - continues to have right basilar atelectasis with mediastinal shift to the right lung field.  There is no significant pleural effusion for intervention which would benefit the patient.  She seems a little better today. I recommend again - OOB to chair or at the very least in chair position. I have ensured order for flutter valve but did not see one at her bedside.  She needs to work on her IS. Improving strength will improve cough mechanics.  If her breathing worsens to the point of worsening atelectasis and hypoxemia I'm not sure how she would do with another ICU stay - I discussed with her that her chances of successful ventilator liberation would decrease if she doesn't improve her strength.   PCCM will continue to follow.  Lenice Llamas, MD [facility] [place] 01/19/2022 11:01 AM Pager: see AMION  If no response to pager, please call critical care on call (see AMION) until 7pm After 7:00 pm call Elink

## 2022-01-20 ENCOUNTER — Encounter (HOSPITAL_COMMUNITY): Payer: Self-pay | Admitting: General Surgery

## 2022-01-20 DIAGNOSIS — J9601 Acute respiratory failure with hypoxia: Secondary | ICD-10-CM | POA: Diagnosis not present

## 2022-01-20 DIAGNOSIS — J189 Pneumonia, unspecified organism: Secondary | ICD-10-CM | POA: Diagnosis not present

## 2022-01-20 DIAGNOSIS — K559 Vascular disorder of intestine, unspecified: Secondary | ICD-10-CM | POA: Diagnosis not present

## 2022-01-20 DIAGNOSIS — I469 Cardiac arrest, cause unspecified: Secondary | ICD-10-CM | POA: Diagnosis not present

## 2022-01-20 DIAGNOSIS — R0602 Shortness of breath: Secondary | ICD-10-CM | POA: Diagnosis not present

## 2022-01-20 DIAGNOSIS — I4811 Longstanding persistent atrial fibrillation: Secondary | ICD-10-CM | POA: Diagnosis not present

## 2022-01-20 LAB — CBC WITH DIFFERENTIAL/PLATELET
Abs Immature Granulocytes: 0.14 10*3/uL — ABNORMAL HIGH (ref 0.00–0.07)
Basophils Absolute: 0 10*3/uL (ref 0.0–0.1)
Basophils Relative: 0 %
Eosinophils Absolute: 0.2 10*3/uL (ref 0.0–0.5)
Eosinophils Relative: 1 %
HCT: 26 % — ABNORMAL LOW (ref 36.0–46.0)
Hemoglobin: 8.3 g/dL — ABNORMAL LOW (ref 12.0–15.0)
Immature Granulocytes: 1 %
Lymphocytes Relative: 8 %
Lymphs Abs: 1.1 10*3/uL (ref 0.7–4.0)
MCH: 34 pg (ref 26.0–34.0)
MCHC: 31.9 g/dL (ref 30.0–36.0)
MCV: 106.6 fL — ABNORMAL HIGH (ref 80.0–100.0)
Monocytes Absolute: 1.5 10*3/uL — ABNORMAL HIGH (ref 0.1–1.0)
Monocytes Relative: 11 %
Neutro Abs: 11.1 10*3/uL — ABNORMAL HIGH (ref 1.7–7.7)
Neutrophils Relative %: 79 %
Platelets: 292 10*3/uL (ref 150–400)
RBC: 2.44 MIL/uL — ABNORMAL LOW (ref 3.87–5.11)
RDW: 15.7 % — ABNORMAL HIGH (ref 11.5–15.5)
WBC: 14 10*3/uL — ABNORMAL HIGH (ref 4.0–10.5)
nRBC: 0 % (ref 0.0–0.2)

## 2022-01-20 LAB — MAGNESIUM: Magnesium: 1.8 mg/dL (ref 1.7–2.4)

## 2022-01-20 LAB — BASIC METABOLIC PANEL
Anion gap: 9 (ref 5–15)
BUN: 21 mg/dL (ref 8–23)
CO2: 33 mmol/L — ABNORMAL HIGH (ref 22–32)
Calcium: 9.7 mg/dL (ref 8.9–10.3)
Chloride: 97 mmol/L — ABNORMAL LOW (ref 98–111)
Creatinine, Ser: 0.55 mg/dL (ref 0.44–1.00)
GFR, Estimated: >60 mL/min (ref >60)
Glucose, Bld: 90 mg/dL (ref 70–99)
Potassium: 3.5 mmol/L (ref 3.5–5.1)
Sodium: 139 mmol/L (ref 135–145)

## 2022-01-20 LAB — HEPARIN LEVEL (UNFRACTIONATED): Heparin Unfractionated: 0.53 IU/mL (ref 0.30–0.70)

## 2022-01-20 LAB — PHOSPHORUS: Phosphorus: 2.7 mg/dL (ref 2.5–4.6)

## 2022-01-20 MED ORDER — BISACODYL 10 MG RE SUPP
10.0000 mg | Freq: Once | RECTAL | Status: DC
  Filled 2022-01-20: qty 1

## 2022-01-20 MED ORDER — RIVAROXABAN 20 MG PO TABS
20.0000 mg | ORAL_TABLET | Freq: Every day | ORAL | Status: DC
  Administered 2022-01-20 – 2022-01-23 (×4): 20 mg via ORAL
  Filled 2022-01-20 (×4): qty 1

## 2022-01-20 MED ORDER — PROPRANOLOL HCL ER 80 MG PO CP24
80.0000 mg | ORAL_CAPSULE | Freq: Every day | ORAL | Status: DC
  Filled 2022-01-20: qty 1

## 2022-01-20 MED ORDER — POTASSIUM CHLORIDE 20 MEQ PO PACK
20.0000 meq | PACK | Freq: Two times a day (BID) | ORAL | Status: DC
  Administered 2022-01-20 – 2022-01-21 (×3): 20 meq via ORAL
  Filled 2022-01-20 (×3): qty 1

## 2022-01-20 MED ORDER — FUROSEMIDE 10 MG/ML IJ SOLN
60.0000 mg | Freq: Two times a day (BID) | INTRAMUSCULAR | Status: DC
  Administered 2022-01-20 – 2022-01-21 (×3): 60 mg via INTRAVENOUS
  Filled 2022-01-20 (×3): qty 6

## 2022-01-20 MED ORDER — FUROSEMIDE 10 MG/ML IJ SOLN: 60.0000 mg | Freq: Two times a day (BID) | INTRAMUSCULAR | Status: DC

## 2022-01-20 MED ORDER — BISOPROLOL FUMARATE 5 MG PO TABS
5.0000 mg | ORAL_TABLET | Freq: Every day | ORAL | Status: DC
  Administered 2022-01-20 – 2022-01-22 (×3): 5 mg via ORAL
  Filled 2022-01-20 (×3): qty 1

## 2022-01-20 MED ORDER — RIVAROXABAN 20 MG PO TABS: 20.0000 mg | ORAL_TABLET | Freq: Every day | ORAL | Status: DC

## 2022-01-20 MED ORDER — IRBESARTAN 150 MG PO TABS
150.0000 mg | ORAL_TABLET | Freq: Every day | ORAL | Status: DC
  Administered 2022-01-20 – 2022-01-22 (×3): 150 mg via ORAL
  Filled 2022-01-20 (×3): qty 1

## 2022-01-20 NOTE — Progress Notes (Signed)
   NAME:  [name], MRN:  [id number], DOB:  [birth year], LOS: 26 ADMISSION DATE:  12/29/2021, CONSULTATION DATE:  02/02/2022 REFERRING MD:  Bonner Puna, CHIEF COMPLAINT:  Hypotension, abdominal distention    History of Present Illness:  81 yo female admited for CAP causing respiratory failure, had a cardiac arrest after admission and was moved to the ICU.  Chest tube placed for parapneumonic effusion, extubated on 8/2. Sent out of ICU on 8/5. 8/6 developed ischemic colitis initially treated conservatively but this morning we were called back 8/7 for worsening shock, confusion.  She is going to the OR today.   Pertinent  Medical History  Breast ca on letrozole Afib on [place] Hospital Events: Including procedures, antibiotic start and stop dates in addition to other pertinent events   7/28 admit, Ceftriaxone/Azithro 7/29 last dose xeralto  7/30 IHCA x 7 min, Ceftriaxone>Cefepime 7/31 Chest tube placed, heparin gtt started 8/1 patient remove chest tube.  Head CT obtained >negative.  Severely agitated overnight.  Vancomycin stopped.  Azithromycin stopped. 8/6 Transferred to [facility] 8/7 Called CCM back for abd, Distention , hypotension and a fib with RVR, plan is for  urgent OR pending goals of care discussion then ICU post op if husband is in agreement. 8/10 Abdomen closed yesterday, SBT/SAT today 8/11 extubated yesterday, doing well on [place], up OOB, feeling alright 8/14 chest ultrasound shows atelectasis without significant right basilar effusion.  Interim History / Subjective:  Sitting up in chair this morning Using flutter valve and coughing up sputum.  Feels breathing is slowly improving.  Significant other at bedside Inquiring about inpatient rehab.    Objective   Blood pressure (!) 144/86, pulse 99, temperature 99 F (37.2 C), temperature source Oral, resp. rate 16, height 5' (1.524 m), weight 75.4 kg, SpO2 100 %.    FiO2 (%):  [36 %] 36 %   Intake/Output Summary  (Last 24 hours) at 01/20/2022 1129 Last data filed at 01/20/2022 0450 Gross per 24 hour  Intake 480 ml  Output 1500 ml  Net -1020 ml   Filed Weights   01/17/22 0512 01/24/2022 0528 01/20/22 0450  Weight: 74.3 kg 75.3 kg 75.4 kg   Sitting up in chair Using flutter valve and has rhonchi that clear with coughing Breath sounds still diminished in right lung field 100% on 4LNC Irregularly irregular   Resolved Hospital Problem list   Septic shock Hypoxic Respiratory Failure  Assessment & Plan:   Acute hypoxemic respiratory failure related to: CAP with parapneumonic effusion on the right side s/p pigtail drainage (now removed) Right basilar atelectasis and elevated right hemidiaphragm with 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  AF with RVR Group 2 PAH Severe protein calorie malnutrition on TPN   She looks better today. Promising that she is starting to bring up phelgm and cough mechanics are improving. Continue to achieve euvolemic fluid balance per cardiology. Please wean her O2 for goal sats over 90%. Continue IS and flutter valve which I have reviewed with patient at bedside. I think rehab would be best for her pulmonary health.   PCCM will continue to follow.   Lenice Llamas, MD [facility] [place] 01/20/2022 11:29 AM Pager: see AMION  If no response to pager, please call critical care on call (see AMION) until 7pm After 7:00 pm call Elink

## 2022-01-20 NOTE — Progress Notes (Signed)
Physical Therapy Treatment Patient Details Name: [name] MRN: [id number] DOB: November 07, 1940 Today's Date: 01/20/2022   History of Present Illness 81 y.o. female admitted 12/06/2021 with CAP.  7/30 AMS, head CT negative for acute abnormality. Code blue 7/30, ROSC achieved after 7 min CPR, pt intubated. Chest tube insertion 7/31; pt removed CT 8/1. ETT 7/30-8/2. Continued AMS, hallucinations overnight 8/2; head CT 8/2 remains negative for acute abnormality. EEG 8/2 with no seizure activity. Improved cognition 8/5, but persistent tachycardia/afib with RVR. 8/6 ischemic colitis. 8/7 shock with intubation and to OR for ex lap. 8/9 abdomen closure. 8/10 extubation.  PMH includes afib, HTN, breast CA, melanoma, asthma, bilateral TKA, anxiety.    PT Comments    Pt received OOB in recliner on arrival and found to be soiled with BM. Pt able to come to standing with light min assist to RW with assist needed to power up and steady; pt able to maintain static standing for >15 mins throughout session for peri-care with RW and min guard for safety. Upon returning to sit pt with fair tolerance for LE therex for increased ROM and strength. Ambulation attempts deferred this session as pt with max fatigue post peri-care and declining second standing trial. Continued education re; upright positioning in recliner, nutrition, continued mobility and activity recommendations, with pt and spouse verbalizing understanding. Pt continues to benefit from skilled PT services to progress toward functional mobility goals.    Recommendations for follow up therapy are one component of a multi-disciplinary discharge planning process, led by the attending physician.  Recommendations may be updated based on patient status, additional functional criteria and insurance authorization.  Follow Up Recommendations  Acute inpatient rehab (3hours/day) Can patient physically be transported by private vehicle: No   Assistance Recommended at  Discharge Frequent or constant Supervision/Assistance  Patient can return home with the following Direct supervision/assist for medications management;Assistance with feeding;Assistance with cooking/housework;Direct supervision/assist for financial management;Assist for transportation;A lot of help with walking and/or transfers;A lot of help with bathing/dressing/bathroom   Equipment Recommendations  Rolling walker (2 wheels);BSC/3in1    Recommendations for Other Services Rehab consult;OT consult     Precautions / Restrictions Precautions Precautions: Fall;Other (comment) Precaution Comments: watch HR Restrictions Weight Bearing Restrictions: No     Mobility  Bed Mobility Overal bed mobility: Needs Assistance             General bed mobility comments: pt OOB in recliner on arrival    Transfers Overall transfer level: Needs assistance Equipment used: Rolling walker (2 wheels) Transfers: Sit to/from Stand, Bed to chair/wheelchair/BSC Sit to Stand: Min assist           General transfer comment: minA to powerup into standing from from recliner, min guard while static standing    Ambulation/Gait               General Gait Details: deferred attempt after extended standing peri-care as pt stating fatigue and declining seconda standing trial   Stairs             Wheelchair Mobility    Modified Rankin (Stroke Patients Only)       Balance Overall balance assessment: Needs assistance Sitting-balance support: Feet supported Sitting balance-Leahy Scale: Fair Sitting balance - Comments: minguard sitting EOB, pt with 1x minor loos of balance able to self correct   Standing balance support: Bilateral upper extremity supported Standing balance-Leahy Scale: Poor Standing balance comment: heavy reliance on BUE support in standing  Cognition Arousal/Alertness: Awake/alert Behavior During Therapy: WFL for tasks  assessed/performed Overall Cognitive Status: Impaired/Different from baseline Area of Impairment: Attention, Memory, Following commands, Safety/judgement, Problem solving, Awareness                 Orientation Level: Disoriented to, Time, Situation Current Attention Level: Selective Memory: Decreased short-term memory Following Commands: Follows one step commands with increased time, Follows multi-step commands with increased time Safety/Judgement: Decreased awareness of safety, Decreased awareness of deficits Awareness: Emergent Problem Solving: Slow processing, Requires verbal cues General Comments: more appropriate during session and able to remain on task, cues for safety        Exercises General Exercises - Lower Extremity Long Arc Quad: AROM, Right, Left, 20 reps, Seated Hip ABduction/ADduction: AROM, Right, Left, 10 reps, Seated Hip Flexion/Marching: AROM, Right, Left, 10 reps, Seated, Standing Other Exercises Other Exercises: static standing >15 mins    General Comments General comments (skin integrity, edema, etc.): pt husband present and encouraging throughout session      Pertinent Vitals/Pain Pain Assessment Pain Assessment: No/denies pain    Home Living                          Prior Function            PT Goals (current goals can now be found in the care plan section) Acute Rehab PT Goals Patient Stated Goal: return home PT Goal Formulation: With patient Time For Goal Achievement: 01/28/22    Frequency    Min 3X/week      PT Plan      Co-evaluation              AM-PAC PT "6 Clicks" Mobility   Outcome Measure  Help needed turning from your back to your side while in a flat bed without using bedrails?: A Lot Help needed moving from lying on your back to sitting on the side of a flat bed without using bedrails?: A Lot Help needed moving to and from a bed to a chair (including a wheelchair)?: A Lot Help needed standing up  from a chair using your arms (e.g., wheelchair or bedside chair)?: A Lot Help needed to walk in hospital room?: Total Help needed climbing 3-5 steps with a railing? : Total 6 Click Score: 10    End of Session Equipment Utilized During Treatment: Oxygen Activity Tolerance: Patient tolerated treatment well Patient left: in chair;with call bell/phone within reach;with family/visitor present Nurse Communication: Mobility status PT Visit Diagnosis: Other abnormalities of gait and mobility (R26.89);Difficulty in walking, not elsewhere classified (R26.2);Muscle weakness (generalized) (M62.81)     Time: 2620-3559 PT Time Calculation (min) (ACUTE ONLY): 31 min  Charges:  $Therapeutic Activity: 23-37 mins                     Dwayn Moravek R. PTA Acute Rehabilitation Services Office: [place] 01/20/2022, 11:58 AM

## 2022-01-20 NOTE — Progress Notes (Signed)
PROGRESS NOTE    [name]  PRX:[id number] DOB: 1941-02-26 DOA: 01/04/2022 PCP: [facility] [place]   Brief Narrative:  81 year old female admitted for community-acquired pneumonia and respiratory failure and concurrent cardiac arrest.  Patient had a parapneumonic effusion requiring chest tube.  Developed ischemic colitis was taken to the OR for a laparotomy with worsening shock/confusion. Patient was extubated on 01/14/2022.  7/28 admit, Ceftriaxone/Azithro 7/29 last dose xeralto  7/30 IHCA x 7 min, Ceftriaxone>Cefepime 7/31 Chest tube placed, heparin gtt started 8/1 patient remove chest tube.  Head CT obtained >negative.  Severely agitated overnight.  Vancomycin stopped.  Azithromycin stopped. 8/6 Transferred [facility] [place] 8/7 Called CCM back for abd, Distention , hypotension and a fib with RVR, plan is for urgent OR 8/10 Abdomen closed yesterday, SBT/SAT, extubated 8/13 small BM/flatus 8/14 responded well to diuresis. Unable to complete thora as no evidence of pleural fluid but significant lung trapping noted per [facility] 8/15 overnight requiring BIPAP, improving and progressing.    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   Hypotension   A-fib (HCC)   Lactic acid increased   Cardiac arrest ([place])   Acute respiratory failure with hypoxia (HCC)   Pneumonia of right lower lobe due to infectious organism   Takotsubo cardiomyopathy   Ischemic colitis ([place])   Septic shock ([place])   Septic shock Ischemic colitis with perforation Concurrent CAP with parapneumonic effusion Questionable concurrent cardiogenic shock Shock resolved, off pressors Blood cultures negative so far Known history of Takotsubo cardiomyopathy -EF 25 to 30%, now resolved 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 return on 01/28/2022 for abdominal closure Continue diet as tolerated Completed  Zosyn 01/26/2022  Acute hypoxic respiratory failure due to right sided CAP with parapneumonic effusion, COPD without acute exacerbation  Extubated 01/14/2022  CXR shows R pleural effusion, PCCM called to assist given recent respiratory failure and high risk for respiratory compromise, no further work up Continue PT OT mobilization, flutter, incentive spirometry Neb triple therapy COPD not in acute exacerbation, no indication for steroids at this time    SP PEA arrest 2/2 respiratory distress/ hypoxia Atrial fibrillation with RVR 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 EF 25-30% on 7/31 [place] Continue increased dose of diuretics Switched to Xarelto and propanolol Cardiomyopathy resolving, she has systolic recovery, EF 46%, without evidence of wall motion abnormalities  Moderate protein caloric malnutrition  Hypokalemia Hypomagnesemia Possible re-feeding syndrome Advance diet as tolerated  Left arm swelling/redness DVT Doppler pending       DVT prophylaxis: Xarelto Code Status: Full Family Communication: Husband at bedside  Status is: Inpatient  Dispo: The patient is from: Home              Anticipated d/c is to: To be determined              Anticipated d/c date is: 72+ hours              Patient currently not medically stable for discharge  Consultants:  General surgery, PCCM, cardiology  Antimicrobials:  Zosyn ongoing -completed  01/29/2022 Micafungin discontinued Previously completed short course of azithromycin and cefepime.  Subjective: Denies any chest pain, worsening shortness of breath.   Objective: Vitals:   01/20/22 0824 01/20/22 0825 01/20/22 0954 01/20/22 1200  BP:   (!) 144/86 (!) 110/54  Pulse:   99 91  Resp:   16 20  Temp:  99 F (37.2 C) 98.6 F (37 C)  TempSrc:   Oral Oral  SpO2: 100% 100% 100% 100%  Weight:      Height:        Intake/Output Summary (Last 24 hours) at 01/20/2022 1633 Last data filed at 01/20/2022 1235 Gross per 24  hour  Intake 480 ml  Output 1500 ml  Net -1020 ml   Filed Weights   01/17/22 0512 01/10/2022 0528 01/20/22 0450  Weight: 74.3 kg 75.3 kg 75.4 kg    Examination: General: NAD  Cardiovascular: S1, S2 present Respiratory: Diminished breath sounds bilaterally Abdomen: Soft, large abdominal bandage, C/D/I Musculoskeletal: 1+ bilateral pedal edema noted Skin: Normal Psychiatry: Normal mood    Data Reviewed: I have personally reviewed following labs and imaging studies  CBC: Recent Labs  Lab 01/14/22 0708 01/14/22 0850 01/17/22 1020 01/26/2022 0538 01/19/22 0645 01/20/22 0320  WBC 18.5* 18.3* 11.0* 11.4* 11.0* 14.0*  NEUTROABS 16.3*  --  9.3* 9.6* 8.8* 11.1*  HGB 11.0* 10.1* 8.8* 8.3* 8.6* 8.3*  HCT 32.4* 30.2* 26.1* 25.5* 26.0* 26.0*  MCV 102.5* 105.2* 109.7* 103.7* 104.0* 106.6*  PLT 220 228 235 245 240 371   Basic Metabolic Panel: Recent Labs  Lab 01/15/22 0529 01/16/22 0536 01/16/22 1854 01/17/22 0529 01/17/2022 0538 01/19/22 0645 01/20/22 0320  NA 138 137 137 139 138  138 141 139  K 3.7 3.3* 4.1 4.2 3.9  3.8 3.7 3.5  CL 97* 98 95* 99 100  99 100 97*  CO2 33* 32 33* 33* 35*  32 35* 33*  GLUCOSE 139* 224* 156* 135* 130*  133* 102* 90  BUN '19 18 18 20 23  22 '$ 26* 21  CREATININE 0.58 0.53 0.54 0.49 0.50  0.48 0.62 0.55  CALCIUM 9.3 8.6* 8.7* 8.9 9.0  9.1 9.2 9.7  MG 1.5* 1.7  --  2.2 1.8 1.8 1.8  PHOS 2.1* 3.3  --  2.9 3.3  3.2  --  2.7   GFR: Estimated Creatinine Clearance: 52.6 mL/min (by C-G formula based on SCr of 0.55 mg/dL). Liver Function Tests: Recent Labs  Lab 01/14/22 0708 01/16/22 0536 01/23/2022 0538  AST 33 39 33  ALT '27 23 23  '$ ALKPHOS 80 73 86  BILITOT 1.2 1.1 1.1  PROT 5.3* 5.2* 5.6*  ALBUMIN 2.3* 2.8* 2.6*  2.6*   No results for input(s): "LIPASE", "AMYLASE" in the last 168 hours. No results for input(s): "AMMONIA" in the last 168 hours. Coagulation Profile: No results for input(s): "INR", "PROTIME" in the last 168  hours. Cardiac Enzymes: No results for input(s): "CKTOTAL", "CKMB", "CKMBINDEX", "TROPONINI" in the last 168 hours. BNP (last 3 results) No results for input(s): "PROBNP" in the last 8760 hours. HbA1C: No results for input(s): "HGBA1C" in the last 72 hours. CBG: Recent Labs  Lab 01/16/22 1605 01/16/22 2014 01/16/22 2304 01/17/22 1221 01/17/22 2317  GLUCAP 115* 113* 119* 157* 120*   Lipid Profile: Recent Labs    01/26/2022 0538  TRIG 89   Thyroid Function Tests: No results for input(s): "TSH", "T4TOTAL", "FREET4", "T3FREE", "THYROIDAB" in the last 72 hours. Anemia Panel: No results for input(s): "VITAMINB12", "FOLATE", "FERRITIN", "TIBC", "IRON", "RETICCTPCT" in the last 72 hours. Sepsis Labs: Recent Labs  Lab 01/24/2022 2255  LATICACIDVEN 1.3    No results found for this or any previous visit (from the past 240 hour(s)).       Radiology Studies: DG CHEST PORT 1 VIEW  Result Date: 01/19/2022 CLINICAL DATA:  Dyspnea EXAM: PORTABLE  CHEST 1 VIEW COMPARISON:  01/05/2022 FINDINGS: No significant change in AP portable examination. Large right pleural effusion and associated atelectasis or consolidation. The left lung is normally aerated. Heart and mediastinum unremarkable. Left upper extremity PICC. IMPRESSION: No significant change in AP portable examination. Large right pleural effusion and associated atelectasis or consolidation. The left lung is normally aerated. No new airspace opacity. Electronically Signed   By: Delanna Ahmadi M.D.   On: 01/19/2022 08:47   DG Chest Port 1 View  Result Date: 01/24/2022 CLINICAL DATA:  Acute respiratory distress. EXAM: PORTABLE CHEST 1 VIEW COMPARISON:  January 18, 2022 (10:04 a.m.) FINDINGS: There is stable left-sided PICC line positioning. The cardiac silhouette is enlarged and unchanged in size. There is stable opacification of the mid and lower right lung. The left lung is clear. No pneumothorax is identified. The visualized skeletal  structures are unremarkable. IMPRESSION: Stable opacification of mid and lower right lung, likely representing a combination of pleural effusion and atelectasis/infiltrate. Electronically Signed   By: Virgina Norfolk M.D.   On: 01/17/2022 19:31        Scheduled Meds:  arformoterol  15 mcg Nebulization BID   bisacodyl  10 mg Rectal Once   bisoprolol  5 mg Oral Daily   budesonide (PULMICORT) nebulizer solution  0.25 mg Nebulization BID   dextrose  50 mL Intravenous Once   feeding supplement  1 Container Oral BID BM   furosemide  60 mg Intravenous BID   irbesartan  150 mg Oral Daily   pantoprazole (PROTONIX) IV  40 mg Intravenous Q24H   polyethylene glycol  17 g Oral Daily   potassium chloride  20 mEq Oral BID   revefenacin  175 mcg Nebulization Daily   rivaroxaban  20 mg Oral Q supper   Continuous Infusions:  sodium chloride Stopped (01/12/22 1757)   sodium chloride 250 mL (01/31/2022 2030)    LOS: 19 days     Alma Friendly, MD [facility]  If 7PM-7AM, please contact night-coverage [email or web address]  01/20/2022, 4:33 PM

## 2022-01-20 NOTE — Progress Notes (Signed)
Inpatient Rehab Admissions Coordinator:   I opened a case for insurance for CIR today. Will follow for admit pending auth.   Clemens Catholic, [place], [place] Admissions Coordinator  [phone] ([place]) [phone] (office)

## 2022-01-20 NOTE — Progress Notes (Signed)
Consult to assess L arm PICC. Area with edema, pink under the arm, denies tenderness or pain. Unsure if upper arm was edematous prior to PICC placement. Primary RN states GBR. Primary RN instructed to measure arm circumference at PICC exit line and monitor daily for increase in size and if S/S of infection or Thrombus appear to contact MD for further orders.

## 2022-01-20 NOTE — Progress Notes (Signed)
Progress Note  Patient Name: [name] Date of Encounter: 01/20/2022  [facility] Cardiologist: Kirk Ruths, MD   Subjective   Feeling a little better.  Eating a little more, but appetite not great.  Becomes hoarse after eating. Good urine output but weight unchanged, still roughly 25 pounds above her previously estimated "dry weight". Blood pressure substantially higher.  Inpatient Medications    Scheduled Meds:  arformoterol  15 mcg Nebulization BID   bisacodyl  10 mg Rectal Once   budesonide (PULMICORT) nebulizer solution  0.25 mg Nebulization BID   dextrose  50 mL Intravenous Once   feeding supplement  1 Container Oral BID BM   furosemide  40 mg Intravenous BID   irbesartan  150 mg Oral Daily   pantoprazole (PROTONIX) IV  40 mg Intravenous Q24H   polyethylene glycol  17 g Oral Daily   propranolol ER  80 mg Oral Daily   revefenacin  175 mcg Nebulization Daily   rivaroxaban  20 mg Oral Daily   Continuous Infusions:  sodium chloride Stopped (01/12/22 1757)   sodium chloride 250 mL (01/24/2022 2030)   heparin 1,200 Units/hr (01/19/22 2255)   PRN Meds: sodium chloride, acetaminophen, fentaNYL (SUBLIMAZE) injection, guaiFENesin, haloperidol lactate, hydrALAZINE, levalbuterol, metoprolol tartrate, ondansetron (ZOFRAN) IV, mouth rinse, sodium chloride flush, white petrolatum   Vital Signs    Vitals:   01/19/22 2048 01/20/22 0000 01/20/22 0450 01/20/22 0824  BP: (!) 150/93 115/76 (!) 155/83   Pulse: 88 96 97   Resp: '19 18 18   '$ Temp: 98.9 F (37.2 C) 98.3 F (36.8 C) 98.9 F (37.2 C)   TempSrc: Oral Oral Oral   SpO2: 100% 100% 100% 100%  Weight:   75.4 kg   Height:        Intake/Output Summary (Last 24 hours) at 01/20/2022 0933 Last data filed at 01/20/2022 0450 Gross per 24 hour  Intake 2063.2 ml  Output 2200 ml  Net -136.8 ml      01/20/2022    4:50 AM 01/12/2022    5:28 AM 01/17/2022    5:12 AM  Last 3 Weights  Weight (lbs) 166 lb 3.6 oz 166 lb  0.1 oz 163 lb 12.8 oz  Weight (kg) 75.4 kg 75.3 kg 74.3 kg      Telemetry    Atrial fibrillation with good ventricular rate control- Personally Reviewed  ECG    No new tracing- Personally Reviewed  Physical Exam  Appears very weak and frail, wet cough GEN: No acute distress.   Neck: 8-9 cm JVD Cardiac: Irregular, no murmurs, rubs, or gallops.  Respiratory: Diminished in both bases, rhonchi bilaterally, no wheezes GI: Soft, nontender, non-distended  MS: 1+ dependent edema; No deformity. Neuro:  Nonfocal  Psych: Normal affect   Labs    High Sensitivity Troponin:   Recent Labs  Lab 01/02/22 1023 01/02/22 1308 01/03/22 2119 01/04/22 0513 01/05/22 0502  TROPONINIHS 8 11 123* 131* 55*     Chemistry Recent Labs  Lab 01/14/22 0708 01/14/22 2037 01/16/22 0536 01/16/22 1854 01/09/2022 0538 01/19/22 0645 01/20/22 0320  NA 134*   < > 137   < > 138  138 141 139  K 3.7   < > 3.3*   < > 3.9  3.8 3.7 3.5  CL 102   < > 98   < > 100  99 100 97*  CO2 24   < > 32   < > 35*  32 35* 33*  GLUCOSE 160*   < >  224*   < > 130*  133* 102* 90  BUN 18   < > 18   < > 23  22 26* 21  CREATININE 0.70   < > 0.53   < > 0.50  0.48 0.62 0.55  CALCIUM 8.9   < > 8.6*   < > 9.0  9.1 9.2 9.7  MG 2.1   < > 1.7   < > 1.8 1.8 1.8  PROT 5.3*  --  5.2*  --  5.6*  --   --   ALBUMIN 2.3*  --  2.8*  --  2.6*  2.6*  --   --   AST 33  --  39  --  33  --   --   ALT 27  --  23  --  23  --   --   ALKPHOS 80  --  73  --  86  --   --   BILITOT 1.2  --  1.1  --  1.1  --   --   GFRNONAA >60   < > >60   < > >60  >60 >60 >60  ANIONGAP 8   < > 7   < > 3*  '7 6 9   '$ < > = values in this interval not displayed.    Lipids  Recent Labs  Lab 01/08/2022 0538  TRIG 89    Hematology Recent Labs  Lab 01/24/2022 0538 01/19/22 0645 01/20/22 0320  WBC 11.4* 11.0* 14.0*  RBC 2.46* 2.50* 2.44*  HGB 8.3* 8.6* 8.3*  HCT 25.5* 26.0* 26.0*  MCV 103.7* 104.0* 106.6*  MCH 33.7 34.4* 34.0  MCHC 32.5 33.1 31.9   RDW 15.8* 15.5 15.7*  PLT 245 240 292   Thyroid No results for input(s): "TSH", "FREET4" in the last 168 hours.  BNPNo results for input(s): "BNP", "PROBNP" in the last 168 hours.  DDimer No results for input(s): "DDIMER" in the last 168 hours.   Radiology    DG CHEST PORT 1 VIEW  Result Date: 01/19/2022 CLINICAL DATA:  Dyspnea EXAM: PORTABLE CHEST 1 VIEW COMPARISON:  01/30/2022 FINDINGS: No significant change in AP portable examination. Large right pleural effusion and associated atelectasis or consolidation. The left lung is normally aerated. Heart and mediastinum unremarkable. Left upper extremity PICC. IMPRESSION: No significant change in AP portable examination. Large right pleural effusion and associated atelectasis or consolidation. The left lung is normally aerated. No new airspace opacity. Electronically Signed   By: Delanna Ahmadi M.D.   On: 01/19/2022 08:47   DG Chest Port 1 View  Result Date: 02/02/2022 CLINICAL DATA:  Acute respiratory distress. EXAM: PORTABLE CHEST 1 VIEW COMPARISON:  January 18, 2022 (10:04 a.m.) FINDINGS: There is stable left-sided PICC line positioning. The cardiac silhouette is enlarged and unchanged in size. There is stable opacification of the mid and lower right lung. The left lung is clear. No pneumothorax is identified. The visualized skeletal structures are unremarkable. IMPRESSION: Stable opacification of mid and lower right lung, likely representing a combination of pleural effusion and atelectasis/infiltrate. Electronically Signed   By: Virgina Norfolk M.D.   On: 01/23/2022 19:31   ECHOCARDIOGRAM LIMITED  Result Date: 02/02/2022    ECHOCARDIOGRAM LIMITED REPORT   Patient Name:   [name] Date of Exam: 01/17/2022 Medical Rec #:  [id number]       Height:       60.0 in Accession #:    [id number]      Weight:  166.0 lb Date of Birth:  08-03-1940       BSA:          1.725 m Patient Age:    81 years        BP:           161/70 mmHg Patient Gender:  F               HR:           78 bpm. Exam Location:  Inpatient Procedure: Limited Echo, Intracardiac Opacification Agent, Limited Color Doppler            and Cardiac Doppler Indications:    Congestive Heart Failure I50.9  History:        Patient has prior history of Echocardiogram examinations, most                 recent 01/04/2022. Arrythmias:Atrial Fibrillation; Risk                 Factors:GERD and Hypertension.  Sonographer:    Bernadene Person RDCS Referring Phys: [place]  1. Left ventricular ejection fraction, by estimation, is 60 to 65%. Left ventricular ejection fraction by 2D MOD biplane is 60.6 %. The left ventricle has normal function. The left ventricle has no regional wall motion abnormalities. There is mild left ventricular hypertrophy. Left ventricular diastolic function could not be evaluated.  2. Right ventricular systolic function is moderately reduced. The right ventricular size is normal. There is severely elevated pulmonary artery systolic pressure. The estimated right ventricular systolic pressure is 10.1 mmHg.  3. The mitral valve is grossly normal. Trivial mitral valve regurgitation.  4. The aortic valve is tricuspid. Aortic valve regurgitation is mild to moderate. Aortic valve sclerosis is present, with no evidence of aortic valve stenosis. Aortic regurgitation PHT measures 227 msec.  5. The inferior vena cava is normal in size with <50% respiratory variability, suggesting right atrial pressure of 8 mmHg. Comparison(s): Changes from prior study are noted. 01/04/2022: LVEF 25-30%, Takatsubo-type wall motion abnormalities. FINDINGS  Left Ventricle: Left ventricular ejection fraction, by estimation, is 60 to 65%. Left ventricular ejection fraction by 2D MOD biplane is 60.6 %. The left ventricle has normal function. The left ventricle has no regional wall motion abnormalities. The left ventricular internal cavity size was normal in size. There is mild left ventricular  hypertrophy. Left ventricular diastolic function could not be evaluated. Left ventricular diastolic function could not be evaluated due to atrial fibrillation. Right Ventricle: The right ventricular size is normal. No increase in right ventricular wall thickness. Right ventricular systolic function is moderately reduced. There is severely elevated pulmonary artery systolic pressure. The tricuspid regurgitant velocity is 4.17 m/s, and with an assumed right atrial pressure of 8 mmHg, the estimated right ventricular systolic pressure is 75.1 mmHg. Mitral Valve: The mitral valve is grossly normal. Trivial mitral valve regurgitation. Tricuspid Valve: The tricuspid valve is grossly normal. Tricuspid valve regurgitation is mild. Aortic Valve: The aortic valve is tricuspid. Aortic valve regurgitation is mild to moderate. Aortic regurgitation PHT measures 227 msec. Aortic valve sclerosis is present, with no evidence of aortic valve stenosis. Pulmonic Valve: The pulmonic valve was grossly normal. Pulmonic valve regurgitation is trivial. Aorta: The aortic root and ascending aorta are structurally normal, with no evidence of dilitation. Venous: The inferior vena cava is normal in size with less than 50% respiratory variability, suggesting right atrial pressure of 8 mmHg. LEFT VENTRICLE PLAX 2D  Biplane EF (MOD) LVIDd:         4.50 cm         LV Biplane EF:   Left LVIDs:         2.80 cm                          ventricular LV PW:         1.10 cm                          ejection LV IVS:        1.10 cm                          fraction by LVOT diam:     2.00 cm                          2D MOD LV SV:         62                               biplane is LV SV Index:   36                               60.6 %. LVOT Area:     3.14 cm  LV Volumes (MOD) LV vol d, MOD    53.2 ml A2C: LV vol d, MOD    41.1 ml A4C: LV vol s, MOD    19.5 ml A2C: LV vol s, MOD    17.1 ml A4C: LV SV MOD A2C:   33.7 ml LV SV MOD A4C:    41.1 ml LV SV MOD BP:    30.0 ml RIGHT VENTRICLE TAPSE (M-mode): 1.2 cm LEFT ATRIUM         Index LA diam:    3.90 cm 2.26 cm/m  AORTIC VALVE LVOT Vmax:   110.00 cm/s LVOT Vmean:  72.700 cm/s LVOT VTI:    0.196 m AI PHT:      227 msec  AORTA Ao Root diam: 2.90 cm TRICUSPID VALVE TR Peak grad:   69.6 mmHg TR Vmax:        417.00 cm/s  SHUNTS Systemic VTI:  0.20 m Systemic Diam: 2.00 cm Lyman Bishop MD Electronically signed by Lyman Bishop MD Signature Date/Time: 01/10/2022/4:21:18 PM    Final    DG CHEST PORT 1 VIEW  Result Date: 01/31/2022 CLINICAL DATA:  Respiratory distress, shortness of breath EXAM: PORTABLE CHEST 1 VIEW COMPARISON:  01/17/2022 FINDINGS: Persistent right pleural effusion with adjacent atelectasis/consolidation. Change in appearance may be due to differences in positioning. Persistent probable mild atelectasis at the left lung base. Similar partially obscured cardiomediastinal contours. Left subclavian line is unchanged. IMPRESSION: Persistent right pleural effusion and adjacent atelectasis/consolidation. Electronically Signed   By: Macy Mis M.D.   On: 01/09/2022 10:22    Cardiac Studies   Echocardiogram 01/08/2022   1. Left ventricular ejection fraction, by estimation, is 60 to 65%. Left  ventricular ejection fraction by 2D MOD biplane is 60.6 %. The left  ventricle has normal function. The left ventricle has no regional wall  motion abnormalities. There is mild left  ventricular hypertrophy. Left ventricular diastolic function could not be  evaluated.  2. Right ventricular systolic function is moderately reduced. The right  ventricular size is normal. There is severely elevated pulmonary artery  systolic pressure. The estimated right ventricular systolic pressure is  11.9 mmHg.   3. The mitral valve is grossly normal. Trivial mitral valve  regurgitation.   4. The aortic valve is tricuspid. Aortic valve regurgitation is mild to  moderate. Aortic valve sclerosis is  present, with no evidence of aortic  valve stenosis. Aortic regurgitation PHT measures 227 msec.   5. The inferior vena cava is normal in size with <50% respiratory  variability, suggesting right atrial pressure of 8 mmHg.   Comparison(s): Changes from prior study are noted. 01/04/2022: LVEF 25-30%,  Takatsubo-type wall motion abnormalities.  Patient Profile     81 y.o. female with permanent AFib and history of chronic diastolic HF, asthma and mild HTN, admitted with pneumonia, complicated by sepsis, acute HF due to stress cardiomyopathy, PEA arrest 01/03/22, ischemic colitis w perforation requiring right colectomy 08/07 and intestinal anastomosis 2 days later (now postop day#7/5), has been on TPN.  Starting full liquids today.  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  Echo shows full recovery of left ventricular systolic function.  Have not been able to make a significant dent in fluid overload (dry weight estimated 140 pounds).  Increase diuretics and add potassium supplement AFib: Better fluid intake.  Switch from intravenous medications to oral Xarelto and propranolol, and the doses she was previously taking at home Sepsis: resolved. Initial admission for pneumonia.  Resume antihypertensive medications. S/P partial colectomy: intestinal reanastomosis during second surgery on 08/09.  Passing flatus, but no bowel movement yet. HTN: Restart propranolol and irbesartan Asthma: If she develops wheezing, consider switching to bisoprolol for atrial fibrillation rate control PAH: This is likely to be multifactorial, but at least in part is due to diastolic left heart failure.  Estimated systolic PA pressure almost 80 mmHg on current echocardiogram.  The echocardiogram in 2021 reported estimated PA pressure 36 mmHg, up to 50 mmHg on echo in 2022.  Estimation with echo is fraught with overestimation  error due to the irregular heart rhythm, but the pulmonary pressure is clearly elevated.  Right ventricular function has deteriorated.  ABG during this admission c/w chronic hypoventilation.  Suspect her lung disease is more complex than just asthma.  On chronic anticoagulation so thromboembolic disease is unlikely.  Unable to really evaluate pulmonary function at this time due to acute illness.     For questions or updates, please contact [place] Please consult [email or web address] for contact info under        Signed, Sanda Klein, MD  01/20/2022, 9:33 AM

## 2022-01-20 NOTE — Progress Notes (Signed)
2 Days Post-Op   Subjective/Chief Complaint: Feeling a bit bloated today and not having a great appetite for all that is on her tray.  No BM, but + flatus   Objective: Vital signs in last 24 hours: Temp:  [97.4 F (36.3 C)-99.4 F (37.4 C)] 98.9 F (37.2 C) (08/16 0450) Pulse Rate:  [80-97] 97 (08/16 0450) Resp:  [18-22] 18 (08/16 0450) BP: (115-157)/(76-93) 155/83 (08/16 0450) SpO2:  [99 %-100 %] 100 % (08/16 0824) FiO2 (%):  [36 %] 36 % (08/15 1348) Weight:  [75.4 kg] 75.4 kg (08/16 0450) Last BM Date : 01/19/22  Intake/Output from previous day: 08/15 0701 - 08/16 0700 In: 2063.2 [P.O.:600; I.V.:1426.2; IV Piggyback:37] Out: 2200 [Urine:2200] Intake/Output this shift: No intake/output data recorded.  PE: GI: Soft, appropriate TTP greatest along midline incision. +BS, wound is clean and packed   Lab Results:  Recent Labs    01/19/22 0645 01/20/22 0320  WBC 11.0* 14.0*  HGB 8.6* 8.3*  HCT 26.0* 26.0*  PLT 240 292   BMET Recent Labs    01/19/22 0645 01/20/22 0320  NA 141 139  K 3.7 3.5  CL 100 97*  CO2 35* 33*  GLUCOSE 102* 90  BUN 26* 21  CREATININE 0.62 0.55  CALCIUM 9.2 9.7   PT/INR No results for input(s): "LABPROT", "INR" in the last 72 hours. ABG Recent Labs    01/17/22 1818 01/28/2022 2107 01/19/22 0533  PHART 7.43  --   --   HCO3 35.8* 30.0* 28.5*    Studies/Results: DG CHEST PORT 1 VIEW  Result Date: 01/19/2022 CLINICAL DATA:  Dyspnea EXAM: PORTABLE CHEST 1 VIEW COMPARISON:  01/17/2022 FINDINGS: No significant change in AP portable examination. Large right pleural effusion and associated atelectasis or consolidation. The left lung is normally aerated. Heart and mediastinum unremarkable. Left upper extremity PICC. IMPRESSION: No significant change in AP portable examination. Large right pleural effusion and associated atelectasis or consolidation. The left lung is normally aerated. No new airspace opacity. Electronically Signed   By: Delanna Ahmadi M.D.   On: 01/19/2022 08:47   DG Chest Port 1 View  Result Date: 01/16/2022 CLINICAL DATA:  Acute respiratory distress. EXAM: PORTABLE CHEST 1 VIEW COMPARISON:  January 18, 2022 (10:04 a.m.) FINDINGS: There is stable left-sided PICC line positioning. The cardiac silhouette is enlarged and unchanged in size. There is stable opacification of the mid and lower right lung. The left lung is clear. No pneumothorax is identified. The visualized skeletal structures are unremarkable. IMPRESSION: Stable opacification of mid and lower right lung, likely representing a combination of pleural effusion and atelectasis/infiltrate. Electronically Signed   By: Virgina Norfolk M.D.   On: 02/03/2022 19:31   ECHOCARDIOGRAM LIMITED  Result Date: 01/07/2022    ECHOCARDIOGRAM LIMITED REPORT   Patient Name:   SANJNA HASKEW Date of Exam: 01/10/2022 Medical Rec #:  [id number]       Height:       60.0 in Accession #:    [id number]      Weight:       166.0 lb Date of Birth:  [birth year]       BSA:          1.725 m Patient Age:    81 years        BP:           161/70 mmHg Patient Gender: F               HR:  78 bpm. Exam Location:  Inpatient Procedure: Limited Echo, Intracardiac Opacification Agent, Limited Color Doppler            and Cardiac Doppler Indications:    Congestive Heart Failure I50.9  History:        Patient has prior history of Echocardiogram examinations, most                 recent 01/04/2022. Arrythmias:Atrial Fibrillation; Risk                 Factors:GERD and Hypertension.  Sonographer:    Bernadene Person RDCS Referring Phys: [place]  1. Left ventricular ejection fraction, by estimation, is 60 to 65%. Left ventricular ejection fraction by 2D MOD biplane is 60.6 %. The left ventricle has normal function. The left ventricle has no regional wall motion abnormalities. There is mild left ventricular hypertrophy. Left ventricular diastolic function could not be evaluated.  2. Right  ventricular systolic function is moderately reduced. The right ventricular size is normal. There is severely elevated pulmonary artery systolic pressure. The estimated right ventricular systolic pressure is 81.2 mmHg.  3. The mitral valve is grossly normal. Trivial mitral valve regurgitation.  4. The aortic valve is tricuspid. Aortic valve regurgitation is mild to moderate. Aortic valve sclerosis is present, with no evidence of aortic valve stenosis. Aortic regurgitation PHT measures 227 msec.  5. The inferior vena cava is normal in size with <50% respiratory variability, suggesting right atrial pressure of 8 mmHg. Comparison(s): Changes from prior study are noted. 01/04/2022: LVEF 25-30%, Takatsubo-type wall motion abnormalities. FINDINGS  Left Ventricle: Left ventricular ejection fraction, by estimation, is 60 to 65%. Left ventricular ejection fraction by 2D MOD biplane is 60.6 %. The left ventricle has normal function. The left ventricle has no regional wall motion abnormalities. The left ventricular internal cavity size was normal in size. There is mild left ventricular hypertrophy. Left ventricular diastolic function could not be evaluated. Left ventricular diastolic function could not be evaluated due to atrial fibrillation. Right Ventricle: The right ventricular size is normal. No increase in right ventricular wall thickness. Right ventricular systolic function is moderately reduced. There is severely elevated pulmonary artery systolic pressure. The tricuspid regurgitant velocity is 4.17 m/s, and with an assumed right atrial pressure of 8 mmHg, the estimated right ventricular systolic pressure is 75.1 mmHg. Mitral Valve: The mitral valve is grossly normal. Trivial mitral valve regurgitation. Tricuspid Valve: The tricuspid valve is grossly normal. Tricuspid valve regurgitation is mild. Aortic Valve: The aortic valve is tricuspid. Aortic valve regurgitation is mild to moderate. Aortic regurgitation PHT measures  227 msec. Aortic valve sclerosis is present, with no evidence of aortic valve stenosis. Pulmonic Valve: The pulmonic valve was grossly normal. Pulmonic valve regurgitation is trivial. Aorta: The aortic root and ascending aorta are structurally normal, with no evidence of dilitation. Venous: The inferior vena cava is normal in size with less than 50% respiratory variability, suggesting right atrial pressure of 8 mmHg. LEFT VENTRICLE PLAX 2D                        Biplane EF (MOD) LVIDd:         4.50 cm         LV Biplane EF:   Left LVIDs:         2.80 cm  ventricular LV PW:         1.10 cm                          ejection LV IVS:        1.10 cm                          fraction by LVOT diam:     2.00 cm                          2D MOD LV SV:         62                               biplane is LV SV Index:   36                               60.6 %. LVOT Area:     3.14 cm  LV Volumes (MOD) LV vol d, MOD    53.2 ml A2C: LV vol d, MOD    41.1 ml A4C: LV vol s, MOD    19.5 ml A2C: LV vol s, MOD    17.1 ml A4C: LV SV MOD A2C:   33.7 ml LV SV MOD A4C:   41.1 ml LV SV MOD BP:    30.0 ml RIGHT VENTRICLE TAPSE (M-mode): 1.2 cm LEFT ATRIUM         Index LA diam:    3.90 cm 2.26 cm/m  AORTIC VALVE LVOT Vmax:   110.00 cm/s LVOT Vmean:  72.700 cm/s LVOT VTI:    0.196 m AI PHT:      227 msec  AORTA Ao Root diam: 2.90 cm TRICUSPID VALVE TR Peak grad:   69.6 mmHg TR Vmax:        417.00 cm/s  SHUNTS Systemic VTI:  0.20 m Systemic Diam: 2.00 cm Lyman Bishop MD Electronically signed by Lyman Bishop MD Signature Date/Time: 01/12/2022/4:21:18 PM    Final    DG CHEST PORT 1 VIEW  Result Date: 01/06/2022 CLINICAL DATA:  Respiratory distress, shortness of breath EXAM: PORTABLE CHEST 1 VIEW COMPARISON:  01/17/2022 FINDINGS: Persistent right pleural effusion with adjacent atelectasis/consolidation. Change in appearance may be due to differences in positioning. Persistent probable mild atelectasis at the left  lung base. Similar partially obscured cardiomediastinal contours. Left subclavian line is unchanged. IMPRESSION: Persistent right pleural effusion and adjacent atelectasis/consolidation. Electronically Signed   By: Macy Mis M.D.   On: 01/10/2022 10:22    Anti-infectives: Anti-infectives (From admission, onward)    Start     Dose/Rate Route Frequency Ordered Stop   01/30/2022 1400  micafungin (MYCAMINE) 100 mg in sodium chloride 0.9 % 100 mL IVPB  Status:  Discontinued        100 mg 105 mL/hr over 1 Hours Intravenous Every 24 hours 01/28/2022 1302 01/15/22 1211   01/10/22 1515  piperacillin-tazobactam (ZOSYN) IVPB 3.375 g        3.375 g 12.5 mL/hr over 240 Minutes Intravenous Every 8 hours 01/10/22 1427 01/19/2022 2359   01/04/22 2000  vancomycin (VANCOCIN) IVPB 1000 mg/200 mL premix  Status:  Discontinued        1,000 mg 200 mL/hr over 60 Minutes Intravenous Every 24 hours 01/03/22  1910 01/05/22 1321   01/03/22 2200  ceFEPIme (MAXIPIME) 2 g in sodium chloride 0.9 % 100 mL IVPB        2 g 200 mL/hr over 30 Minutes Intravenous Every 12 hours 01/03/22 1910 01/08/22 2209   01/03/22 1930  vancomycin (VANCOREADY) IVPB 1250 mg/250 mL        1,250 mg 166.7 mL/hr over 90 Minutes Intravenous  Once 01/03/22 1910 01/03/22 2219   01/02/22 2100  azithromycin (ZITHROMAX) 500 mg in sodium chloride 0.9 % 250 mL IVPB        500 mg 250 mL/hr  Intravenous Every 24 hours 12/05/2021 2248 01/06/22 2149   01/02/22 2000  cefTRIAXone (ROCEPHIN) 2 g in sodium chloride 0.9 % 100 mL IVPB  Status:  Discontinued        2 g 200 mL/hr over 30 Minutes Intravenous Every 24 hours 12/14/2021 2248 01/03/22 1733   12/10/2021 2030  cefTRIAXone (ROCEPHIN) 1 g in sodium chloride 0.9 % 100 mL IVPB        1 g 200 mL/hr over 30 Minutes Intravenous  Once 12/19/2021 2025 12/16/2021 2141   12/08/2021 2030  azithromycin (ZITHROMAX) 500 mg in sodium chloride 0.9 % 250 mL IVPB        500 mg 250 mL/hr over 60 Minutes Intravenous  Once 12/12/2021  2025 12/30/2021 2312       Assessment/Plan: POD 9/7 S/p exploratory laparotomy,  R colectomy, abthera placement 01/14/2022 Dr. Rosendo Gros S/p exploratory laparotomy, intestinal anastomosis, abdominal closure 02/02/2022 Dr. Rosendo Gros  for ischemic bowel with perforation - afebrile - WBC up to 14K, monitor.  Unclear whether this may be abdominal vs pulmonary in nature - TNA off - soft diet, protein shakes -miralax daily, dulcolax suppository today - wet-to-dry dressing to midline BID.  -mobilize/pulm toilet.  Therapies recommend CIR -cont zosyn for 5 days post op, stop date 8/14   FEN: soft/ensure ID: Zosyn (stop date 8/14) VTE: SCD's, hep gtt  Foley: purewick Dispo: progressive     Below per TRH -- Septic shock due to above Cardiac arrest 7/30, 7 mins CPR Hx cardiomyopathy COPD Afib, permanent CAP   LOS: 19 days    Henreitta Cea, [facility] Surgery 01/20/2022, 8:54 AM Please see Amion for pager number during day hours 7:00am-4:30pm

## 2022-01-20 NOTE — Progress Notes (Signed)
   01/20/22 2300  Mobility  Activity Transferred from chair to bed  Range of Motion/Exercises Active;All extremities  Level of Assistance Maximum assist, patient does 25-49%  Assistive Device MaxiMove  Activity Response Tolerated fair   Pt requesting to get back in bed fm chair. Pt unable to stand w/ x1 assist. Attempted use of stedy, pt unable to pull self up enough to utilize device. Ultimately had to use hoyer lift to put pt in bed.

## 2022-01-20 NOTE — Progress Notes (Signed)
Occupational Therapy Treatment Patient Details Name: [name] MRN: [id number] DOB: 01-10-1941 Today's Date: 01/20/2022   History of present illness 81 y.o. female admitted 12/31/2021 with CAP.  7/30 AMS, head CT negative for acute abnormality. Code blue 7/30, ROSC achieved after 7 min CPR, pt intubated. Chest tube insertion 7/31; pt removed CT 8/1. ETT 7/30-8/2. Continued AMS, hallucinations overnight 8/2; head CT 8/2 remains negative for acute abnormality. EEG 8/2 with no seizure activity. Improved cognition 8/5, but persistent tachycardia/afib with RVR. 8/6 ischemic colitis. 8/7 shock with intubation and to OR for ex lap. 8/9 abdomen closure. 8/10 extubation.  PMH includes afib, HTN, breast CA, melanoma, asthma, bilateral TKA, anxiety.   OT comments  Patient is very motivated to participate in session even with increased fatigue from patients reported difficult night. Patient was educated on LB Dressing tasks seated in recliner. Patient was able to demonstrate understanding to don/doff socks seated in recliner. Patient was also educated on proper positioning of BLE to reduce edema. Patient verbalized and demonstrated understanding. Patient would continue to benefit from skilled OT services at this time while admitted and after d/c to address noted deficits in order to improve overall safety and independence in ADLs.     Recommendations for follow up therapy are one component of a multi-disciplinary discharge planning process, led by the attending physician.  Recommendations may be updated based on patient status, additional functional criteria and insurance authorization.    Follow Up Recommendations  Acute inpatient rehab (3hours/day)    Assistance Recommended at Discharge Frequent or constant Supervision/Assistance  Patient can return home with the following  A lot of help with walking and/or transfers;A lot of help with bathing/dressing/bathroom;Assistance with cooking/housework;Assistance  with feeding;Direct supervision/assist for medications management;Direct supervision/assist for financial management;Assist for transportation;Help with stairs or ramp for entrance   Equipment Recommendations  None recommended by OT    Recommendations for Other Services      Precautions / Restrictions Precautions Precautions: Fall;Other (comment) Precaution Comments: watch HR Restrictions Weight Bearing Restrictions: No       Mobility Bed Mobility                    Transfers                         Balance                                           ADL either performed or assessed with clinical judgement   ADL Overall ADL's : Needs assistance/impaired                       Lower Body Dressing Details (indicate cue type and reason): patient was seated in recliner reporting increased fatigue. patient agreed to work on [employer] dressing with AE. patient was educated on using reacher to doff socks and sock aid to don socks with minimal verbal cues to complete task after education. patient was educated on total hip kits and other items available to assist with LB dressing/bathing tasks on the market. patient verbalized understanding.   Toilet Transfer Details (indicate cue type and reason): patient declined to transfer stating fatigue.                Extremity/Trunk Assessment  Vision       Perception     Praxis      Cognition Arousal/Alertness: Awake/alert Behavior During Therapy: WFL for tasks assessed/performed Overall Cognitive Status: Impaired/Different from baseline                                 General Comments: patient was able to follow directions for AE training        Exercises      Shoulder Instructions       General Comments      Pertinent Vitals/ Pain       Pain Assessment Pain Assessment: 0-10 Pain Score: 5  Pain Location: abdomen/back Pain Descriptors /  Indicators: Discomfort, Grimacing Pain Intervention(s): Monitored during session, Limited activity within patient's tolerance  Home Living                                          Prior Functioning/Environment              Frequency  Min 2X/week        Progress Toward Goals  OT Goals(current goals can now be found in the care plan section)  Progress towards OT goals: Progressing toward goals     Plan Discharge plan remains appropriate    Co-evaluation                 AM-PAC OT "6 Clicks" Daily Activity     Outcome Measure   Help from another person eating meals?: A Little Help from another person taking care of personal grooming?: A Little Help from another person toileting, which includes using toliet, bedpan, or urinal?: A Lot Help from another person bathing (including washing, rinsing, drying)?: A Lot Help from another person to put on and taking off regular upper body clothing?: A Little Help from another person to put on and taking off regular lower body clothing?: A Lot 6 Click Score: 15    End of Session Equipment Utilized During Treatment: Other (comment) (total hip kit)  OT Visit Diagnosis: Other abnormalities of gait and mobility (R26.89);Other symptoms and signs involving cognitive function;Muscle weakness (generalized) (M62.81)   Activity Tolerance Patient tolerated treatment well;Patient limited by fatigue   Patient Left in chair;with call bell/phone within reach;with chair alarm set   Nurse Communication Mobility status        Time: 2536-6440 OT Time Calculation (min): 19 min  Charges: OT General Charges $OT Visit: 1 Visit OT Treatments $Self Care/Home Management : 8-22 mins  [name] OTR/L, MS Acute Rehabilitation Department Office# [phone] Pager# [phone]   [name] 01/20/2022, 3:07 PM

## 2022-01-20 NOTE — Progress Notes (Signed)
Patient wearing oxygen set at 2lpm with Sp02=98% at this time. Patient has no c/o shortness of breath. Bipap in the room on standby.

## 2022-01-20 NOTE — Plan of Care (Signed)
Pt is alert and oriented x 4. Vitals stable. Increase in nonpitt edema to right arm. Elevated on 2 pillows. IV team assessed. Measured. With elevation no pain in arm at this time decrease in nonpitt edema. No prns given.  Problem: Education: Goal: Knowledge of General Education information will improve Description: Including pain rating scale, medication(s)/side effects and non-pharmacologic comfort measures Outcome: Progressing   Problem: Health Behavior/Discharge Planning: Goal: Ability to manage health-related needs will improve Outcome: Progressing   Problem: Clinical Measurements: Goal: Ability to maintain clinical measurements within normal limits will improve Outcome: Progressing Goal: Will remain free from infection Outcome: Progressing Goal: Diagnostic test results will improve Outcome: Progressing Goal: Respiratory complications will improve Outcome: Progressing Goal: Cardiovascular complication will be avoided Outcome: Progressing   Problem: Activity: Goal: Risk for activity intolerance will decrease Outcome: Progressing   Problem: Nutrition: Goal: Adequate nutrition will be maintained Outcome: Progressing   Problem: Coping: Goal: Level of anxiety will decrease Outcome: Progressing   Problem: Elimination: Goal: Will not experience complications related to bowel motility Outcome: Progressing Goal: Will not experience complications related to urinary retention Outcome: Progressing   Problem: Pain Managment: Goal: General experience of comfort will improve Outcome: Progressing   Problem: Safety: Goal: Ability to remain free from injury will improve Outcome: Progressing   Problem: Skin Integrity: Goal: Risk for impaired skin integrity will decrease Outcome: Progressing   Problem: Education: Goal: Ability to demonstrate management of disease process will improve Outcome: Progressing Goal: Ability to verbalize understanding of medication therapies will  improve Outcome: Progressing Goal: Individualized Educational Video(s) Outcome: Progressing   Problem: Activity: Goal: Capacity to carry out activities will improve Outcome: Progressing   Problem: Cardiac: Goal: Ability to achieve and maintain adequate cardiopulmonary perfusion will improve Outcome: Progressing

## 2022-01-20 NOTE — Progress Notes (Addendum)
Pt in chair and repositioned. Arm noted to have increased nonpitting edema and red/pink discoloration. Pt had been in chair with arm dependent and bent. Arm elevated on pillow. Decrease nonpitting edema noted with wrinkles. Informed charge RN due to amiodarone infusing. Picc drawn back for labs and obtained without difficulty. Consult placed for IV team to come assess. Dressing due 8/17. Circumference 31.5 cm.   01/20/22 0300  PICC Triple Lumen 41/03/01 Left Basilic 45 cm 0 cm  Placement Date/Time: 01/12/22 1225   Time Out: (c) Correct patient;Correct site;Correct procedure  Maximum sterile barrier precautions: Hand hygiene;Sterile gloves;Cap;Large sterile sheet;Mask;Sterile gown  Site Prep: Skin Prep Completely Dry at the T...  Indication for Insertion or Continuance of Line Administration of hyperosmolar/irritating solutions (i.e. TPN, Vancomycin, etc.)  Site Assessment Other (Comment);Edematous;Bruised;Dry;Intact;Bleeding (posterior upper arm edematous and pink. bruising to forearm. scant amount of old blood at insertion site)  Lumen #1 Status  (white)  Lumen #2 Status Infusing  Lumen #3 Status Infusing  Dressing Type Transparent;Securing device  Dressing Status Antimicrobial disc in place  Safety Lock Intact  Line Care Connections checked and tightened  Line Adjustment (NICU/IV Team Only) No  Dressing Change Due 01/21/22

## 2022-01-20 NOTE — Progress Notes (Signed)
Nutrition Follow-up  DOCUMENTATION CODES:   Not applicable  INTERVENTION:   Diet clarification: dysphagia 3 with thin liquids (mechanical soft) per SLP recommendations.   Continue Boost Breeze po TID, each supplement provides 250 kcal and 9 grams of protein.  Add vanilla Magic cup po TID with meals, each supplement provides 290 kcal and 9 grams of protein.  Add MVI with minerals PO daily.  NUTRITION DIAGNOSIS:   Inadequate oral intake related to acute illness as evidenced by NPO status.  Ongoing, now on a soft diet.   GOAL:   Patient will meet greater than or equal to 90% of their needs  Progressing   MONITOR:   Vent status, Labs, Weight trends, I & O's (TPN, GI function)  REASON FOR ASSESSMENT:   Consult, Ventilator Enteral/tube feeding initiation and management  ASSESSMENT:   81 yo female admitted with acute respiratory failure related to COPD exacerbation and pneumonia, went into PEA arrest and required intubation. PMH includes A.fib, breast cancer, HTN, GERD, asthma, melanoma  SLP following for dysphagia. Mechanical soft diet recommended. Currently on a GI soft diet, will clarify order to dysphagia 3 (mechanical soft) with thin liquids. Patient's nurse reports patient ate well yesterday, consuming 50-75% of her meals, but she didn't drink her supplement because she thought it was Ensure. She has not been as hungry today. She was drinking a Boost Breeze this morning. She likes the peach flavor. She agreed to try vanilla magic cups with meals to maximize intake of protein and calories.   Labs reviewed. Medications reviewed and include Lasix, Miralax, Klor-Con.  Diet Order:   Diet Order             DIET SOFT Room service appropriate? Yes; Fluid consistency: Thin  Diet effective now                   EDUCATION NEEDS:   Not appropriate for education at this time  Skin:  Skin Assessment: Skin Integrity Issues: Skin Integrity Issues:: Incisions Wound Vac:  removed Incisions: abdomen  Last BM:  8/15 type 7  Height:   Ht Readings from Last 1 Encounters:  01/04/22 5' (1.524 m)    Weight:   Wt Readings from Last 1 Encounters:  01/20/22 75.4 kg    BMI:  Body mass index is 32.46 kg/m.  Estimated Nutritional Needs:   Kcal:  1500-1700 kcals  Protein:  85-100 g  Fluid:  >/= 1.5 L    Lucas Mallow RD, LDN, CNSC Please refer to Amion for contact information.

## 2022-01-21 ENCOUNTER — Inpatient Hospital Stay (HOSPITAL_COMMUNITY): Payer: Medicare Other

## 2022-01-21 DIAGNOSIS — M7989 Other specified soft tissue disorders: Secondary | ICD-10-CM

## 2022-01-21 DIAGNOSIS — L538 Other specified erythematous conditions: Secondary | ICD-10-CM

## 2022-01-21 DIAGNOSIS — I4811 Longstanding persistent atrial fibrillation: Secondary | ICD-10-CM | POA: Diagnosis not present

## 2022-01-21 DIAGNOSIS — I469 Cardiac arrest, cause unspecified: Secondary | ICD-10-CM | POA: Diagnosis not present

## 2022-01-21 DIAGNOSIS — J9811 Atelectasis: Secondary | ICD-10-CM

## 2022-01-21 DIAGNOSIS — J189 Pneumonia, unspecified organism: Secondary | ICD-10-CM | POA: Diagnosis not present

## 2022-01-21 DIAGNOSIS — I5181 Takotsubo syndrome: Secondary | ICD-10-CM | POA: Diagnosis not present

## 2022-01-21 DIAGNOSIS — J9601 Acute respiratory failure with hypoxia: Secondary | ICD-10-CM | POA: Diagnosis not present

## 2022-01-21 LAB — BASIC METABOLIC PANEL
Anion gap: 8 (ref 5–15)
BUN: 19 mg/dL (ref 8–23)
CO2: 34 mmol/L — ABNORMAL HIGH (ref 22–32)
Calcium: 9.5 mg/dL (ref 8.9–10.3)
Chloride: 97 mmol/L — ABNORMAL LOW (ref 98–111)
Creatinine, Ser: 0.72 mg/dL (ref 0.44–1.00)
GFR, Estimated: >60 mL/min (ref >60)
Glucose, Bld: 93 mg/dL (ref 70–99)
Potassium: 3 mmol/L — ABNORMAL LOW (ref 3.5–5.1)
Sodium: 139 mmol/L (ref 135–145)

## 2022-01-21 LAB — CBC WITH DIFFERENTIAL/PLATELET
Abs Immature Granulocytes: 0.08 10*3/uL — ABNORMAL HIGH (ref 0.00–0.07)
Basophils Absolute: 0 10*3/uL (ref 0.0–0.1)
Basophils Relative: 0 %
Eosinophils Absolute: 0.2 10*3/uL (ref 0.0–0.5)
Eosinophils Relative: 2 %
HCT: 23.2 % — ABNORMAL LOW (ref 36.0–46.0)
Hemoglobin: 7.6 g/dL — ABNORMAL LOW (ref 12.0–15.0)
Immature Granulocytes: 1 %
Lymphocytes Relative: 10 %
Lymphs Abs: 0.9 10*3/uL (ref 0.7–4.0)
MCH: 34.1 pg — ABNORMAL HIGH (ref 26.0–34.0)
MCHC: 32.8 g/dL (ref 30.0–36.0)
MCV: 104 fL — ABNORMAL HIGH (ref 80.0–100.0)
Monocytes Absolute: 1.2 10*3/uL — ABNORMAL HIGH (ref 0.1–1.0)
Monocytes Relative: 13 %
Neutro Abs: 7.1 10*3/uL (ref 1.7–7.7)
Neutrophils Relative %: 74 %
Platelets: 294 10*3/uL (ref 150–400)
RBC: 2.23 MIL/uL — ABNORMAL LOW (ref 3.87–5.11)
RDW: 15.9 % — ABNORMAL HIGH (ref 11.5–15.5)
WBC: 9.5 10*3/uL (ref 4.0–10.5)
nRBC: 0 % (ref 0.0–0.2)

## 2022-01-21 MED ORDER — ENSURE ENLIVE PO LIQD
237.0000 mL | Freq: Two times a day (BID) | ORAL | Status: DC
Start: 2022-01-21 — End: 2022-01-26
  Administered 2022-01-21 – 2022-01-24 (×6): 237 mL via ORAL

## 2022-01-21 MED ORDER — POTASSIUM CHLORIDE 20 MEQ PO PACK
40.0000 meq | PACK | Freq: Three times a day (TID) | ORAL | Status: DC
Start: 2022-01-21 — End: 2022-01-23
  Administered 2022-01-21 – 2022-01-22 (×4): 40 meq via ORAL
  Filled 2022-01-21 (×5): qty 2

## 2022-01-21 MED ORDER — FUROSEMIDE 10 MG/ML IJ SOLN
80.0000 mg | Freq: Two times a day (BID) | INTRAMUSCULAR | Status: DC
  Administered 2022-01-21 – 2022-01-22 (×3): 80 mg via INTRAVENOUS
  Filled 2022-01-21 (×3): qty 8

## 2022-01-21 NOTE — Progress Notes (Signed)
Progress Note  Patient Name: [name] Date of Encounter: 01/21/2022  [facility] Cardiologist: Kirk Ruths, MD   Subjective   Feels stronger, less dyspneic. Eating better. Had BM yesterday. No progress with diuresis, weight unchanged.  Inpatient Medications    Scheduled Meds:  arformoterol  15 mcg Nebulization BID   bisacodyl  10 mg Rectal Once   bisoprolol  5 mg Oral Daily   budesonide (PULMICORT) nebulizer solution  0.25 mg Nebulization BID   dextrose  50 mL Intravenous Once   feeding supplement  237 mL Oral BID BM   furosemide  80 mg Intravenous BID   irbesartan  150 mg Oral Daily   pantoprazole (PROTONIX) IV  40 mg Intravenous Q24H   polyethylene glycol  17 g Oral Daily   potassium chloride  40 mEq Oral TID   revefenacin  175 mcg Nebulization Daily   rivaroxaban  20 mg Oral Q supper   Continuous Infusions:  sodium chloride Stopped (01/12/22 1757)   sodium chloride 250 mL (01/26/2022 2030)   PRN Meds: sodium chloride, acetaminophen, fentaNYL (SUBLIMAZE) injection, guaiFENesin, haloperidol lactate, hydrALAZINE, levalbuterol, metoprolol tartrate, ondansetron (ZOFRAN) IV, mouth rinse, sodium chloride flush, white petrolatum   Vital Signs    Vitals:   01/21/22 0003 01/21/22 0420 01/21/22 0821 01/21/22 0920  BP: (!) 92/50 (!) 99/53    Pulse: 71 76 90   Resp: '20 19 18 18  '$ Temp: 97.8 F (36.6 C) 97.8 F (36.6 C)  98.9 F (37.2 C)  TempSrc: Oral Oral  Oral  SpO2: 100% 99% 99%   Weight:  75.2 kg    Height:        Intake/Output Summary (Last 24 hours) at 01/21/2022 1126 Last data filed at 01/21/2022 0424 Gross per 24 hour  Intake 360 ml  Output 750 ml  Net -390 ml      01/21/2022    4:20 AM 01/20/2022    4:50 AM 01/26/2022    5:28 AM  Last 3 Weights  Weight (lbs) 165 lb 12.6 oz 166 lb 3.6 oz 166 lb 0.1 oz  Weight (kg) 75.2 kg 75.4 kg 75.3 kg      Telemetry    AFib, rate controlled - Personally Reviewed  ECG    No new tracing - Personally  Reviewed  Physical Exam  Weak, frail GEN: No acute distress.   Neck: No JVD Cardiac: irregular, no murmurs, rubs, or gallops.  Respiratory: Clear to auscultation bilaterally. GI: Soft, nontender, non-distended  MS: diffuse mild edema; No deformity. Neuro:  Nonfocal  Psych: Normal affect   Labs    High Sensitivity Troponin:   Recent Labs  Lab 01/02/22 1023 01/02/22 1308 01/03/22 2119 01/04/22 0513 01/05/22 0502  TROPONINIHS 8 11 123* 131* 55*     Chemistry Recent Labs  Lab 01/16/22 0536 01/16/22 1854 01/24/2022 0538 01/19/22 0645 01/20/22 0320 01/21/22 0513  NA 137   < > 138  138 141 139 139  K 3.3*   < > 3.9  3.8 3.7 3.5 3.0*  CL 98   < > 100  99 100 97* 97*  CO2 32   < > 35*  32 35* 33* 34*  GLUCOSE 224*   < > 130*  133* 102* 90 93  BUN 18   < > 23  22 26* 21 19  CREATININE 0.53   < > 0.50  0.48 0.62 0.55 0.72  CALCIUM 8.6*   < > 9.0  9.1 9.2 9.7 9.5  MG  1.7   < > 1.8 1.8 1.8  --   PROT 5.2*  --  5.6*  --   --   --   ALBUMIN 2.8*  --  2.6*  2.6*  --   --   --   AST 39  --  33  --   --   --   ALT 23  --  23  --   --   --   ALKPHOS 73  --  86  --   --   --   BILITOT 1.1  --  1.1  --   --   --   GFRNONAA >60   < > >60  >60 >60 >60 >60  ANIONGAP 7   < > 3*  '7 6 9 8   '$ < > = values in this interval not displayed.    Lipids  Recent Labs  Lab 01/28/2022 0538  TRIG 89    Hematology Recent Labs  Lab 01/19/22 0645 01/20/22 0320 01/21/22 0513  WBC 11.0* 14.0* 9.5  RBC 2.50* 2.44* 2.23*  HGB 8.6* 8.3* 7.6*  HCT 26.0* 26.0* 23.2*  MCV 104.0* 106.6* 104.0*  MCH 34.4* 34.0 34.1*  MCHC 33.1 31.9 32.8  RDW 15.5 15.7* 15.9*  PLT 240 292 294   Thyroid No results for input(s): "TSH", "FREET4" in the last 168 hours.  BNPNo results for input(s): "BNP", "PROBNP" in the last 168 hours.  DDimer No results for input(s): "DDIMER" in the last 168 hours.   Radiology    No results found.  Cardiac Studies   Echocardiogram 01/14/2022    1. Left  ventricular ejection fraction, by estimation, is 60 to 65%. Left  ventricular ejection fraction by 2D MOD biplane is 60.6 %. The left  ventricle has normal function. The left ventricle has no regional wall  motion abnormalities. There is mild left  ventricular hypertrophy. Left ventricular diastolic function could not be  evaluated.   2. Right ventricular systolic function is moderately reduced. The right  ventricular size is normal. There is severely elevated pulmonary artery  systolic pressure. The estimated right ventricular systolic pressure is  32.9 mmHg.   3. The mitral valve is grossly normal. Trivial mitral valve  regurgitation.   4. The aortic valve is tricuspid. Aortic valve regurgitation is mild to  moderate. Aortic valve sclerosis is present, with no evidence of aortic  valve stenosis. Aortic regurgitation PHT measures 227 msec.   5. The inferior vena cava is normal in size with <50% respiratory  variability, suggesting right atrial pressure of 8 mmHg.   Comparison(s): Changes from prior study are noted. 01/04/2022: LVEF 25-30%,  Takatsubo-type wall motion abnormalities.  Patient Profile     81 y.o. female  with permanent AFib and history of chronic diastolic HF, asthma and mild HTN, admitted with pneumonia, complicated by sepsis, acute HF due to stress cardiomyopathy, PEA arrest 01/03/22, ischemic colitis w perforation requiring right colectomy 08/07 and intestinal anastomosis 08/09, now taking PO meds.  Assessment & Plan    Acute on chronic HF: Still markedly hypervolemic. Need to treat hypokalemia before increasing diuretics. Hypoalbuminemia likely impeding diuresis. 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  Echo shows full recovery of left ventricular systolic function.  Have not been able to make a significant dent in fluid overload (dry weight estimated 140 pounds).  AFib: Rate controlled.  On Xarelto. Needs  crushed meds so replaced Inderal LA w bisoprolol (better cardioselectivity anyway). Sepsis: resolved. Initial  admission for pneumonia.  Resume antihypertensive medications. S/P partial colectomy: intestinal reanastomosis during second surgery on 08/09.  Passing flatus, but no bowel movement yet. HTN: on beta blocker and ARB Asthma: bisoprolol preferred over propranolol PAH: This is likely to be multifactorial, but at least in part is due to diastolic left heart failure.  Estimated systolic PA pressure almost 80 mmHg on current echocardiogram.  The echocardiogram in 2021 reported estimated PA pressure 36 mmHg, up to 50 mmHg on echo in 2022.  Estimation with echo is fraught with overestimation error due to the irregular heart rhythm, but the pulmonary pressure is clearly elevated.  Right ventricular function has deteriorated.  ABG during this admission c/w chronic hypoventilation.  Suspect her lung disease is more complex than just asthma.  On chronic anticoagulation so thromboembolic disease is unlikely.  Unable to really evaluate pulmonary function at this time due to acute illness.     For questions or updates, please contact [facility] Please consult [email or web address] for contact info under        Signed, Sanda Klein, MD  01/21/2022, 11:26 AM

## 2022-01-21 NOTE — Discharge Instructions (Signed)
WOUND CARE: - midline dressing to be changed daily - supplies: sterile saline, gauze, scissors, tape  - remove dressing and all packing carefully, moistening with sterile saline as needed to avoid packing/internal dressing sticking to the wound. - clean edges of skin around the wound with water/gauze, making sure there is no tape debris or leakage left on skin that could cause skin irritation or breakdown. - dampen and clean gauze with sterile saline and pack wound from wound base to skin level, making sure to take note of any possible areas of wound tracking, tunneling and packing appropriately. Wound can be packed loosely. Trim gauze to size if a whole gauze is not required. - cover wound with a dry gauze and secure with tape.  - write the date/time on the dry dressing/tape to better track when the last dressing change occurred. - change dressing as needed if leakage occurs, wound gets contaminated, or patient requests to shower. - patient may shower daily with wound open (i.e. remove all packing) and following the shower the wound should be dried and a clean dressing placed.    CC[facility]      [facility] [phone]  OPEN ABDOMINAL SURGERY: POST OP INSTRUCTIONS  Always review your discharge instruction sheet given to you by the facility where your surgery was performed.  IF YOU HAVE DISABILITY OR FAMILY LEAVE FORMS, YOU MUST BRING THEM TO THE OFFICE FOR PROCESSING.  PLEASE DO NOT GIVE THEM TO YOUR DOCTOR.  A prescription for pain medication may be given to you upon discharge.  Take your pain medication as prescribed, if needed.  If narcotic pain medicine is not needed, then you may take acetaminophen (Tylenol) or ibuprofen (Advil) as needed. Take your usually prescribed medications unless otherwise directed. If you need a refill on your pain medication, please contact your pharmacy. They will contact our office to request authorization.  Prescriptions will not be filled after  5pm or on week-ends. You should follow a light diet the first few days after arrival home, such as soup and crackers, pudding, etc.unless your doctor has advised otherwise. A high-fiber, low fat diet can be resumed as tolerated.   Be sure to include lots of fluids daily. Most patients will experience some swelling and bruising on the chest and neck area.  Ice packs will help.  Swelling and bruising can take several days to resolve Most patients will experience some swelling and bruising in the area of the incision. Ice pack will help. Swelling and bruising can take several days to resolve..  It is common to experience some constipation if taking pain medication after surgery.  Increasing fluid intake and taking a stool softener will usually help or prevent this problem from occurring.  A mild laxative (Milk of Magnesia or Miralax) should be taken according to package directions if there are no bowel movements after 48 hours.  You may have steri-strips (small skin tapes) in place directly over the incision.  These strips should be left on the skin for 7-10 days.  If your surgeon used skin glue on the incision, you may shower in 24 hours.  The glue will flake off over the next 2-3 weeks.  Any sutures or staples will be removed at the office during your follow-up visit. You may find that a light gauze bandage over your incision may keep your staples from being rubbed or pulled. You may shower and replace the bandage daily. ACTIVITIES:  You may resume regular (light) daily activities beginning the   next day--such as daily self-care, walking, climbing stairs--gradually increasing activities as tolerated.  You may have sexual intercourse when it is comfortable.  Refrain from any heavy lifting or straining until approved by your doctor. You may drive when you no longer are taking prescription pain medication, you can comfortably wear a seatbelt, and you can safely maneuver your car and apply brakes Return to Work:  ___________________________________ You should see your doctor in the office for a follow-up appointment approximately two weeks after your surgery.  Make sure that you call for this appointment within a day or two after you arrive home to insure a convenient appointment time. OTHER INSTRUCTIONS:  _____________________________________________________________ _____________________________________________________________  WHEN TO CALL YOUR DOCTOR: Fever over 101.0 Inability to urinate Nausea and/or vomiting Extreme swelling or bruising Continued bleeding from incision. Increased pain, redness, or drainage from the incision. Difficulty swallowing or breathing Muscle cramping or spasms. Numbness or tingling in hands or feet or around lips.  The clinic staff is available to answer your questions during regular business hours.  Please don't hesitate to call and ask to speak to one of the nurses if you have concerns.  For further questions, please visit [email or web address]   

## 2022-01-21 NOTE — Progress Notes (Signed)
   NAME:  [name], MRN:  [id number], DOB:  [birth year], LOS: 52 ADMISSION DATE:  12/18/2021, CONSULTATION DATE:  01/07/2022 REFERRING MD:  Bonner Puna, CHIEF COMPLAINT:  Hypotension, abdominal distention    History of Present Illness:  80 yo female admited for CAP causing respiratory failure, had a cardiac arrest after admission and was moved to the ICU.  Chest tube placed for parapneumonic effusion, extubated on 8/2. Sent out of ICU on 8/5. 8/6 developed ischemic colitis initially treated conservatively but this morning we were called back 8/7 for worsening shock, confusion.  She is going to the OR today.   Pertinent  Medical History  Breast ca on letrozole Afib on [facility] Events: Including procedures, antibiotic start and stop dates in addition to other pertinent events   7/28 admit, Ceftriaxone/Azithro 7/29 last dose xeralto  7/30 IHCA x 7 min, Ceftriaxone>Cefepime 7/31 Chest tube placed, heparin gtt started 8/1 patient remove chest tube.  Head CT obtained >negative.  Severely agitated overnight.  Vancomycin stopped.  Azithromycin stopped. 8/6 Transferred to [facility] 8/7 Called CCM back for abd, Distention , hypotension and a fib with RVR, plan is for  urgent OR pending goals of care discussion then ICU post op if husband is in agreement. 8/10 Abdomen closed yesterday, SBT/SAT today 8/11 extubated yesterday, doing well on [facility], up OOB, feeling alright 8/14 chest ultrasound shows atelectasis without significant right basilar effusion.  Interim History / Subjective:  Sitting up in  bed.  On 2LNC.  Continues to cough and clear secretions.    Objective   Blood pressure (!) 99/53, pulse 90, temperature 98.9 F (37.2 C), temperature source Oral, resp. rate 18, height 5' (1.524 m), weight 75.2 kg, SpO2 99 %.    FiO2 (%):  [28 %] 28 %   Intake/Output Summary (Last 24 hours) at 01/21/2022 1107 Last data filed at 01/21/2022 0424 Gross per 24 hour  Intake 360 ml   Output 750 ml  Net -390 ml   Filed Weights   01/14/2022 0528 01/20/22 0450 01/21/22 0420  Weight: 75.3 kg 75.4 kg 75.2 kg   Sittng up in bed No respiratory distress HR in the 70s.  Right basilar crackles On 2LNC Normal speech.    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  AF with RVR Group 2 PAH Severe protein calorie malnutrition on TPN   Down to [facility]. I think she is continuing to improve and will hopefully weaned off oxygen. Continue IS, flutter valve and PT for strengthening cough mechanics. No objection to d/c from pulmonary perspective.   PCCM will see as needed. Please call with questions.   Lenice Llamas, MD Pulmonary and [place] 01/21/2022 11:07 AM Pager: see AMION  If no response to pager, please call critical care on call (see AMION) until 7pm After 7:00 pm call El[facility]

## 2022-01-21 NOTE — Progress Notes (Signed)
IP rehab admissions - a peer to peer has been offered by insurance carrier with the medical director if attending MD would like a peer to peer.  Please call by 10 am 01/22/22 to [phone] option 5 with name, DOB and ID # [id number].  Please call me if you have questions.  [phone]

## 2022-01-21 NOTE — Progress Notes (Signed)
   01/21/22 1305  Clinical Encounter Type  Visited With Patient not available  Visit Type Initial;Spiritual support  Referral From Nurse  Consult/Referral To Chaplain   Chaplain responded to a spiritual consult request for prayer. The patient, [name], was sound asleep at the time of my visit. I will try again tomorrow.   Danice Goltz  [facility]  [phone]

## 2022-01-21 NOTE — Progress Notes (Signed)
3 Days Post-Op   Subjective/Chief Complaint: Feeling a bit bloated today still but had a great BM after suppository yesterday.  Eating some of her solid food.   Objective: Vital signs in last 24 hours: Temp:  [97.8 F (36.6 C)-99 F (37.2 C)] 97.8 F (36.6 C) (08/17 0420) Pulse Rate:  [71-99] 90 (08/17 0821) Resp:  [16-20] 18 (08/17 0821) BP: (92-144)/(50-86) 99/53 (08/17 0420) SpO2:  [96 %-100 %] 99 % (08/17 0821) FiO2 (%):  [28 %] 28 % (08/17 0821) Weight:  [75.2 kg] 75.2 kg (08/17 0420) Last BM Date : 01/20/22  Intake/Output from previous day: 08/16 0701 - 08/17 0700 In: 360 [P.O.:360] Out: 750 [Urine:750] Intake/Output this shift: No intake/output data recorded.  PE: GI: Soft, appropriate TTP greatest along midline incision. +BS, wound is clean and packed   Lab Results:  Recent Labs    01/20/22 0320 01/21/22 0513  WBC 14.0* 9.5  HGB 8.3* 7.6*  HCT 26.0* 23.2*  PLT 292 294   BMET Recent Labs    01/20/22 0320 01/21/22 0513  NA 139 139  K 3.5 3.0*  CL 97* 97*  CO2 33* 34*  GLUCOSE 90 93  BUN 21 19  CREATININE 0.55 0.72  CALCIUM 9.7 9.5   PT/INR No results for input(s): "LABPROT", "INR" in the last 72 hours. ABG Recent Labs    01/22/2022 2107 01/19/22 0533  HCO3 30.0* 28.5*    Studies/Results: No results found.  Anti-infectives: Anti-infectives (From admission, onward)    Start     Dose/Rate Route Frequency Ordered Stop   02/03/2022 1400  micafungin (MYCAMINE) 100 mg in sodium chloride 0.9 % 100 mL IVPB  Status:  Discontinued        100 mg 105 mL/hr over 1 Hours Intravenous Every 24 hours 01/31/2022 1302 01/15/22 1211   01/10/22 1515  piperacillin-tazobactam (ZOSYN) IVPB 3.375 g        3.375 g 12.5 mL/hr over 240 Minutes Intravenous Every 8 hours 01/10/22 1427 01/23/2022 2359   01/04/22 2000  vancomycin (VANCOCIN) IVPB 1000 mg/200 mL premix  Status:  Discontinued        1,000 mg 200 mL/hr over 60 Minutes Intravenous Every 24 hours 01/03/22 1910  01/05/22 1321   01/03/22 2200  ceFEPIme (MAXIPIME) 2 g in sodium chloride 0.9 % 100 mL IVPB        2 g 200 mL/hr over 30 Minutes Intravenous Every 12 hours 01/03/22 1910 01/08/22 2209   01/03/22 1930  vancomycin (VANCOREADY) IVPB 1250 mg/250 mL        1,250 mg 166.7 mL/hr over 90 Minutes Intravenous  Once 01/03/22 1910 01/03/22 2219   01/02/22 2100  azithromycin (ZITHROMAX) 500 mg in sodium chloride 0.9 % 250 mL IVPB        500 mg 250 mL/hr  Intravenous Every 24 hours 12/18/2021 2248 01/06/22 2149   01/02/22 2000  cefTRIAXone (ROCEPHIN) 2 g in sodium chloride 0.9 % 100 mL IVPB  Status:  Discontinued        2 g 200 mL/hr over 30 Minutes Intravenous Every 24 hours 12/14/2021 2248 01/03/22 1733   12/08/2021 2030  cefTRIAXone (ROCEPHIN) 1 g in sodium chloride 0.9 % 100 mL IVPB        1 g 200 mL/hr over 30 Minutes Intravenous  Once 12/31/2021 2025 01/02/2022 2141   12/29/2021 2030  azithromycin (ZITHROMAX) 500 mg in sodium chloride 0.9 % 250 mL IVPB        500 mg 250 mL/hr over  60 Minutes Intravenous  Once 12/17/2021 2025 12/27/2021 2312       Assessment/Plan: POD 10/8 S/p exploratory laparotomy,  R colectomy, abthera placement 02/04/2022 Dr. Rosendo Gros S/p exploratory laparotomy, intestinal anastomosis, abdominal closure 01/31/2022 Dr. Rosendo Gros  for ischemic bowel with perforation - afebrile - WBC normal at 9 today - TNA off - D3, protein shakes -miralax daily - wet-to-dry dressing to midline BID.  -mobilize/pulm toilet.  Therapies recommend CIR -cont zosyn for 5 days post op, stop date 8/14 -surgically stable for CIR or SNF when medically stable   FEN: soft/ensure ID: Zosyn (stop date 8/14) VTE: SCD's, hep gtt  Foley: purewick Dispo: progressive     Below per TRH -- Septic shock due to above Cardiac arrest 7/30, 7 mins CPR Hx cardiomyopathy COPD Afib, permanent CAP   LOS: 20 days    Henreitta Cea, [facility] 01/21/2022, 9:15 AM Please see Amion for pager number during  day hours 7:00am-4:30pm

## 2022-01-21 NOTE — Progress Notes (Signed)
PROGRESS NOTE    [name]  QMG:[id number] DOB: 1940/08/30 DOA: 12/18/2021 PCP: [facility] [place]   Brief Narrative:  81 year old female admitted for community-acquired pneumonia and respiratory failure and concurrent cardiac arrest.  Patient had a parapneumonic effusion requiring chest tube.  Developed ischemic colitis was taken to the OR for a laparotomy with worsening shock/confusion. Patient was extubated on 01/14/2022.  7/28 admit, Ceftriaxone/Azithro 7/29 last dose xeralto  7/30 IHCA x 7 min, Ceftriaxone>Cefepime 7/31 Chest tube placed, heparin gtt started 8/1 patient remove chest tube.  Head CT obtained >negative.  Severely agitated overnight.  Vancomycin stopped.  Azithromycin stopped. 8/6 Transferred [facility] [place] 8/7 Called CCM back for abd, Distention , hypotension and a fib with RVR, plan is for urgent OR 8/10 Abdomen closed yesterday, SBT/SAT, extubated 8/13 small BM/flatus 8/14 responded well to diuresis. Unable to complete thora as no evidence of pleural fluid but significant lung trapping noted per [facility] 8/15 overnight requiring BIPAP, improving and progressing.    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   Hypotension   A-fib (HCC)   Lactic acid increased   Cardiac arrest ([place])   Acute respiratory failure with hypoxia (HCC)   Pneumonia of right lower lobe due to infectious organism   Takotsubo cardiomyopathy   Ischemic colitis ([place])   Septic shock ([place])   Septic shock Ischemic colitis with perforation Concurrent CAP with parapneumonic effusion Questionable concurrent cardiogenic shock Shock resolved, off pressors Blood cultures negative so far Known history of Takotsubo cardiomyopathy -EF 25 to 30%, now resolved Status post ex lap colectomy 01/10/2022 return on 01/30/2022 for abdominal closure Continue diet as tolerated Completed  Zosyn 01/31/2022  Acute hypoxic respiratory failure due to right sided CAP with parapneumonic effusion, COPD without acute exacerbation  Extubated 01/14/2022  CXR shows R pleural effusion, PCCM called to assist given recent respiratory failure and high risk for respiratory compromise, no further work up Continue PT OT mobilization, flutter, incentive spirometry Neb triple therapy COPD not in acute exacerbation, no indication for steroids at this time    SP PEA arrest 2/2 respiratory distress/ hypoxia Atrial fibrillation with RVR 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 EF 25-30% on 7/31 [place] Continue increased dose of diuretics, still not at dry weight Switched to Xarelto and bisoprolol Cardiomyopathy resolving, she has systolic recovery, EF 89%, without evidence of wall motion abnormalities  Moderate protein caloric malnutrition  Hypokalemia Hypomagnesemia Possible re-feeding syndrome Advance diet as tolerated  Macrocytic anemia ABLA post op, multifactorial Hgb trending downwards No obvious signs of bleeding Daily CBC, transfuse if hemoglobin less than 7  Left arm swelling/redness DVT Doppler negative       DVT prophylaxis: Xarelto Code Status: Full Family Communication: Husband at bedside  Status is: Inpatient  Dispo: The patient is from: Home              Anticipated d/c is to: To be determined              Anticipated d/c date is: 72+ hours              Patient currently not medically stable for discharge  Consultants:  General surgery, PCCM, cardiology  Antimicrobials:  Zosyn ongoing -completed  01/30/2022 Micafungin discontinued Previously completed short course of azithromycin and cefepime.  Subjective: Denies any new complaints   Objective: Vitals:   01/21/22 1694 01/21/22 0920 01/21/22 1232  01/21/22 1627  BP:   (!) 89/44 (!) 98/51  Pulse: 90  68 80  Resp: '18 18 18 18  '$ Temp:  98.9 F (37.2 C) 99 F (37.2 C) 99.3 F (37.4 C)  TempSrc:  Oral  Oral Oral  SpO2: 99%  100% 95%  Weight:      Height:        Intake/Output Summary (Last 24 hours) at 01/21/2022 1636 Last data filed at 01/21/2022 1628 Gross per 24 hour  Intake 120 ml  Output 1200 ml  Net -1080 ml   Filed Weights   01/10/2022 0528 01/20/22 0450 01/21/22 0420  Weight: 75.3 kg 75.4 kg 75.2 kg    Examination: General: NAD  Cardiovascular: S1, S2 present Respiratory: Diminished breath sounds bilaterally Abdomen: Soft, large abdominal bandage, C/D/I Musculoskeletal: 1+ bilateral pedal edema noted Skin: Normal Psychiatry: Normal mood    Data Reviewed: I have personally reviewed following labs and imaging studies  CBC: Recent Labs  Lab 01/17/22 1020 01/06/2022 0538 01/19/22 0645 01/20/22 0320 01/21/22 0513  WBC 11.0* 11.4* 11.0* 14.0* 9.5  NEUTROABS 9.3* 9.6* 8.8* 11.1* 7.1  HGB 8.8* 8.3* 8.6* 8.3* 7.6*  HCT 26.1* 25.5* 26.0* 26.0* 23.2*  MCV 109.7* 103.7* 104.0* 106.6* 104.0*  PLT 235 245 240 292 161   Basic Metabolic Panel: Recent Labs  Lab 01/15/22 0529 01/16/22 0536 01/16/22 1854 01/17/22 0529 01/20/2022 0538 01/19/22 0645 01/20/22 0320 01/21/22 0513  NA 138 137   < > 139 138  138 141 139 139  K 3.7 3.3*   < > 4.2 3.9  3.8 3.7 3.5 3.0*  CL 97* 98   < > 99 100  99 100 97* 97*  CO2 33* 32   < > 33* 35*  32 35* 33* 34*  GLUCOSE 139* 224*   < > 135* 130*  133* 102* 90 93  BUN 19 18   < > '20 23  22 '$ 26* 21 19  CREATININE 0.58 0.53   < > 0.49 0.50  0.48 0.62 0.55 0.72  CALCIUM 9.3 8.6*   < > 8.9 9.0  9.1 9.2 9.7 9.5  MG 1.5* 1.7  --  2.2 1.8 1.8 1.8  --   PHOS 2.1* 3.3  --  2.9 3.3  3.2  --  2.7  --    < > = values in this interval not displayed.   GFR: Estimated Creatinine Clearance: 52.5 mL/min (by C-G formula based on SCr of 0.72 mg/dL). Liver Function Tests: Recent Labs  Lab 01/16/22 0536 01/05/2022 0538  AST 39 33  ALT 23 23  ALKPHOS 73 86  BILITOT 1.1 1.1  PROT 5.2* 5.6*  ALBUMIN 2.8* 2.6*  2.6*   No results for input(s):  "LIPASE", "AMYLASE" in the last 168 hours. No results for input(s): "AMMONIA" in the last 168 hours. Coagulation Profile: No results for input(s): "INR", "PROTIME" in the last 168 hours. Cardiac Enzymes: No results for input(s): "CKTOTAL", "CKMB", "CKMBINDEX", "TROPONINI" in the last 168 hours. BNP (last 3 results) No results for input(s): "PROBNP" in the last 8760 hours. HbA1C: No results for input(s): "HGBA1C" in the last 72 hours. CBG: Recent Labs  Lab 01/16/22 1605 01/16/22 2014 01/16/22 2304 01/17/22 1221 01/17/22 2317  GLUCAP 115* 113* 119* 157* 120*   Lipid Profile: No results for input(s): "CHOL", "HDL", "LDLCALC", "TRIG", "CHOLHDL", "LDLDIRECT" in the last 72 hours.  Thyroid Function Tests: No results for input(s): "TSH", "T4TOTAL", "FREET4", "T3FREE", "THYROIDAB" in the last 72 hours. Anemia Panel:  No results for input(s): "VITAMINB12", "FOLATE", "FERRITIN", "TIBC", "IRON", "RETICCTPCT" in the last 72 hours. Sepsis Labs: Recent Labs  Lab 02/04/2022 2255  LATICACIDVEN 1.3    No results found for this or any previous visit (from the past 240 hour(s)).       Radiology Studies: VAS [place] UPPER EXTREMITY VENOUS DUPLEX  Result Date: 01/21/2022 UPPER VENOUS STUDY  Patient Name:  OREE MIRELEZ  Date of Exam:   01/21/2022 Medical Rec #: [id number]        Accession #:    [id number] Date of Birth: 1941/01/07        Patient Gender: F Patient Age:   25 years Exam Location:  [facility] Procedure:      VAS [place] UPPER EXTREMITY VENOUS DUPLEX Referring Phys: [facility] Bruce Mayers --------------------------------------------------------------------------------  Indications: Left arm swelling and erythema, PICC Comparison Study: No prior studies. Performing Technologist: Darlin Coco RDMS, RVT  Examination Guidelines: A complete evaluation includes B-mode imaging, spectral Doppler, color Doppler, and power Doppler as needed of all accessible portions of each vessel. Bilateral testing  is considered an integral part of a complete examination. Limited examinations for reoccurring indications may be performed as noted.  Right Findings: +----------+------------+---------+-----------+----------+-------+ RIGHT     CompressiblePhasicitySpontaneousPropertiesSummary +----------+------------+---------+-----------+----------+-------+ Subclavian               Yes       Yes                      +----------+------------+---------+-----------+----------+-------+  Left Findings: +----------+------------+---------+-----------+----------+---------------+ LEFT      CompressiblePhasicitySpontaneousProperties    Summary     +----------+------------+---------+-----------+----------+---------------+ IJV           Full       Yes       Yes                              +----------+------------+---------+-----------+----------+---------------+ Subclavian    Full       Yes       Yes                              +----------+------------+---------+-----------+----------+---------------+ Axillary      Full       Yes       Yes                              +----------+------------+---------+-----------+----------+---------------+ Brachial      Full                                                  +----------+------------+---------+-----------+----------+---------------+ Radial        Full                                                  +----------+------------+---------+-----------+----------+---------------+ Ulnar         Full                                                  +----------+------------+---------+-----------+----------+---------------+  Cephalic      Full                                                  +----------+------------+---------+-----------+----------+---------------+ Basilic       Full                                  PICC visualized +----------+------------+---------+-----------+----------+---------------+  Summary:  Right: No  evidence of thrombosis in the subclavian.  Left: No evidence of deep vein thrombosis in the upper extremity. No evidence of superficial vein thrombosis in the upper extremity.  *See table(s) above for measurements and observations.  Diagnosing physician: Monica Martinez MD Electronically signed by Monica Martinez MD on 01/21/2022 at 3:19:11 PM.    Final         Scheduled Meds:  arformoterol  15 mcg Nebulization BID   bisacodyl  10 mg Rectal Once   bisoprolol  5 mg Oral Daily   budesonide (PULMICORT) nebulizer solution  0.25 mg Nebulization BID   dextrose  50 mL Intravenous Once   feeding supplement  237 mL Oral BID BM   furosemide  80 mg Intravenous BID   irbesartan  150 mg Oral Daily   pantoprazole (PROTONIX) IV  40 mg Intravenous Q24H   polyethylene glycol  17 g Oral Daily   potassium chloride  40 mEq Oral TID   revefenacin  175 mcg Nebulization Daily   rivaroxaban  20 mg Oral Q supper   Continuous Infusions:  sodium chloride Stopped (01/12/22 1757)   sodium chloride 250 mL (01/23/2022 2030)    LOS: 20 days     Alma Friendly, MD [facility]  If 7PM-7AM, please contact night-coverage [email or web address]  01/21/2022, 4:36 PM

## 2022-01-22 ENCOUNTER — Inpatient Hospital Stay (HOSPITAL_COMMUNITY): Payer: Medicare Other

## 2022-01-22 DIAGNOSIS — I5181 Takotsubo syndrome: Secondary | ICD-10-CM | POA: Diagnosis not present

## 2022-01-22 DIAGNOSIS — J189 Pneumonia, unspecified organism: Secondary | ICD-10-CM | POA: Diagnosis not present

## 2022-01-22 DIAGNOSIS — J9601 Acute respiratory failure with hypoxia: Secondary | ICD-10-CM | POA: Diagnosis not present

## 2022-01-22 DIAGNOSIS — I469 Cardiac arrest, cause unspecified: Secondary | ICD-10-CM | POA: Diagnosis not present

## 2022-01-22 DIAGNOSIS — I4811 Longstanding persistent atrial fibrillation: Secondary | ICD-10-CM | POA: Diagnosis not present

## 2022-01-22 LAB — BASIC METABOLIC PANEL
Anion gap: 5 (ref 5–15)
BUN: 17 mg/dL (ref 8–23)
CO2: 35 mmol/L — ABNORMAL HIGH (ref 22–32)
Calcium: 9.3 mg/dL (ref 8.9–10.3)
Chloride: 98 mmol/L (ref 98–111)
Creatinine, Ser: 0.7 mg/dL (ref 0.44–1.00)
GFR, Estimated: >60 mL/min (ref >60)
Glucose, Bld: 106 mg/dL — ABNORMAL HIGH (ref 70–99)
Potassium: 3.8 mmol/L (ref 3.5–5.1)
Sodium: 138 mmol/L (ref 135–145)

## 2022-01-22 LAB — MAGNESIUM: Magnesium: 1.5 mg/dL — ABNORMAL LOW (ref 1.7–2.4)

## 2022-01-22 MED ORDER — MAGNESIUM OXIDE -MG SUPPLEMENT 400 (240 MG) MG PO TABS: 400.0000 mg | ORAL_TABLET | Freq: Every day | ORAL | Status: DC

## 2022-01-22 MED ORDER — OXYCODONE HCL 5 MG PO TABS: 5.0000 mg | ORAL_TABLET | Freq: Four times a day (QID) | ORAL | Status: DC | PRN

## 2022-01-22 MED ORDER — MAGNESIUM SULFATE 2 GM/50ML IV SOLN
2.0000 g | Freq: Once | INTRAVENOUS | Status: AC
  Administered 2022-01-22: 2 g via INTRAVENOUS
  Filled 2022-01-22: qty 50

## 2022-01-22 MED ORDER — ACETAMINOPHEN 325 MG PO TABS
650.0000 mg | ORAL_TABLET | Freq: Four times a day (QID) | ORAL | Status: DC | PRN
Start: 2022-01-22 — End: 2022-02-04
  Administered 2022-01-22 – 2022-02-03 (×5): 650 mg via ORAL
  Filled 2022-01-22 (×5): qty 2

## 2022-01-22 MED ORDER — MAGNESIUM SULFATE IN D5W 1-5 GM/100ML-% IV SOLN
1.0000 g | Freq: Once | INTRAVENOUS | Status: DC
Start: 2022-01-22 — End: 2022-01-22

## 2022-01-22 NOTE — Progress Notes (Signed)
PROGRESS NOTE    [name]  TFT:[id number] DOB: March 27, 1941 DOA: 12/31/2021 PCP: [facility] [place]   Brief Narrative:  81 year old female admitted for community-acquired pneumonia and respiratory failure and concurrent cardiac arrest.  Patient had a parapneumonic effusion requiring chest tube.  Developed ischemic colitis was taken to the OR for a laparotomy with worsening shock/confusion. Patient was extubated on 01/14/2022.  7/28 admit, Ceftriaxone/Azithro 7/29 last dose xeralto  7/30 IHCA x 7 min, Ceftriaxone>Cefepime 7/31 Chest tube placed, heparin gtt started 8/1 patient remove chest tube.  Head CT obtained >negative.  Severely agitated overnight.  Vancomycin stopped.  Azithromycin stopped. 8/6 Transferred [facility] [place] 8/7 Called CCM back for abd, Distention , hypotension and a fib with RVR, plan is for urgent OR 8/10 Abdomen closed yesterday, SBT/SAT, extubated 8/13 small BM/flatus 8/14 responded well to diuresis. Unable to complete thora as no evidence of pleural fluid but significant lung trapping noted per [facility] 8/15 overnight requiring BIPAP, improving and progressing.    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   Hypotension   A-fib (HCC)   Lactic acid increased   Cardiac arrest ([place])   Acute respiratory failure with hypoxia (HCC)   Pneumonia of right lower lobe due to infectious organism   Takotsubo cardiomyopathy   Ischemic colitis ([place])   Septic shock ([place])   Septic shock Ischemic colitis with perforation Concurrent CAP with parapneumonic effusion Questionable concurrent cardiogenic shock Shock resolved, off pressors Blood cultures negative so far Known history of Takotsubo cardiomyopathy -EF 25 to 30%, now resolved Status post ex lap colectomy 01/23/2022 return on 01/31/2022 for abdominal closure Continue diet as tolerated Completed  Zosyn 01/19/2022  Fever Noted on 01/22/2022 No leukocytosis, CBC pending Chest x-ray with resolving pleural effusion, no new infiltrate opacity UA/UC pending BC x2 pending Noted to have multiple loose stools, patient has been on MiraLAX daily and magnesium oxide, will monitor Monitor closely, low threshold to restart another course of antibiotics as patient just recently completed a course of IV Zosyn on 01/24/2022 If fever persists, will start empiric antibiotics  Acute hypoxic respiratory failure due to right sided CAP with parapneumonic effusion, COPD without acute exacerbation  Extubated 01/14/2022  CXR shows R pleural effusion, PCCM called to assist given recent respiratory failure and high risk for respiratory compromise, no further work up Continue PT OT mobilization, flutter, incentive spirometry Neb triple therapy COPD not in acute exacerbation, no indication for steroids at this time    SP PEA arrest 2/2 respiratory distress/ hypoxia Atrial fibrillation with RVR 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 EF 25-30% on 7/31 [place] Continue increased dose of diuretics, still not at dry weight Switched to Xarelto and bisoprolol Cardiomyopathy resolving, she has systolic recovery, EF 70%, without evidence of wall motion abnormalities  Moderate protein caloric malnutrition  Hypokalemia Hypomagnesemia Possible re-feeding syndrome Advance diet as tolerated  Macrocytic anemia ABLA post op, multifactorial Hgb trending downwards No obvious signs of bleeding Daily CBC, transfuse if hemoglobin less than 7  Left arm swelling/redness DVT Doppler negative       DVT prophylaxis: Xarelto Code Status: Full Family Communication: Husband at bedside  Status is: Inpatient  Dispo: The patient is from: Home              Anticipated d/c is to: To be determined  Anticipated d/c date is: 72+ hours              Patient currently not medically stable for  discharge  Consultants:  General surgery, PCCM, cardiology  Antimicrobials:  Zosyn completed  01/24/2022 Micafungin discontinued Previously completed short course of azithromycin and cefepime.  Subjective: Saw patient earlier this morning, denies any new complaints.   Objective: Vitals:   01/22/22 0731 01/22/22 0816 01/22/22 1157 01/22/22 1704  BP:  (!) 114/49 (!) 124/56 138/79  Pulse: 82 86 88 87  Resp: '16 20 19 20  '$ Temp:  98.5 F (36.9 C) 98.6 F (37 C) (!) 101 F (38.3 C)  TempSrc:  Oral Oral Oral  SpO2: 98% 100% 100% 91%  Weight:      Height:        Intake/Output Summary (Last 24 hours) at 01/22/2022 1822 Last data filed at 01/22/2022 1600 Gross per 24 hour  Intake 890 ml  Output 2350 ml  Net -1460 ml   Filed Weights   01/20/22 0450 01/21/22 0420 01/22/22 0542  Weight: 75.4 kg 75.2 kg 69.3 kg    Examination: General: NAD  Cardiovascular: S1, S2 present Respiratory: Diminished breath sounds bilaterally Abdomen: Soft, large abdominal bandage, C/D/I Musculoskeletal: 1+ bilateral pedal edema noted Skin: Normal Psychiatry: Normal mood    Data Reviewed: I have personally reviewed following labs and imaging studies  CBC: Recent Labs  Lab 01/17/22 1020 02/02/2022 0538 01/19/22 0645 01/20/22 0320 01/21/22 0513  WBC 11.0* 11.4* 11.0* 14.0* 9.5  NEUTROABS 9.3* 9.6* 8.8* 11.1* 7.1  HGB 8.8* 8.3* 8.6* 8.3* 7.6*  HCT 26.1* 25.5* 26.0* 26.0* 23.2*  MCV 109.7* 103.7* 104.0* 106.6* 104.0*  PLT 235 245 240 292 536   Basic Metabolic Panel: Recent Labs  Lab 01/16/22 0536 01/16/22 1854 01/17/22 0529 01/17/2022 0538 01/19/22 0645 01/20/22 0320 01/21/22 0513 01/22/22 0513  NA 137   < > 139 138  138 141 139 139 138  K 3.3*   < > 4.2 3.9  3.8 3.7 3.5 3.0* 3.8  CL 98   < > 99 100  99 100 97* 97* 98  CO2 32   < > 33* 35*  32 35* 33* 34* 35*  GLUCOSE 224*   < > 135* 130*  133* 102* 90 93 106*  BUN 18   < > '20 23  22 '$ 26* '21 19 17  '$ CREATININE 0.53   < > 0.49  0.50  0.48 0.62 0.55 0.72 0.70  CALCIUM 8.6*   < > 8.9 9.0  9.1 9.2 9.7 9.5 9.3  MG 1.7  --  2.2 1.8 1.8 1.8  --  1.5*  PHOS 3.3  --  2.9 3.3  3.2  --  2.7  --   --    < > = values in this interval not displayed.   GFR: Estimated Creatinine Clearance: 50.3 mL/min (by C-G formula based on SCr of 0.7 mg/dL). Liver Function Tests: Recent Labs  Lab 01/16/22 0536 01/20/2022 0538  AST 39 33  ALT 23 23  ALKPHOS 73 86  BILITOT 1.1 1.1  PROT 5.2* 5.6*  ALBUMIN 2.8* 2.6*  2.6*   No results for input(s): "LIPASE", "AMYLASE" in the last 168 hours. No results for input(s): "AMMONIA" in the last 168 hours. Coagulation Profile: No results for input(s): "INR", "PROTIME" in the last 168 hours. Cardiac Enzymes: No results for input(s): "CKTOTAL", "CKMB", "CKMBINDEX", "TROPONINI" in the last 168 hours. BNP (last 3 results) No results for input(s): "PROBNP" in  the last 8760 hours. HbA1C: No results for input(s): "HGBA1C" in the last 72 hours. CBG: Recent Labs  Lab 01/16/22 1605 01/16/22 2014 01/16/22 2304 01/17/22 1221 01/17/22 2317  GLUCAP 115* 113* 119* 157* 120*   Lipid Profile: No results for input(s): "CHOL", "HDL", "LDLCALC", "TRIG", "CHOLHDL", "LDLDIRECT" in the last 72 hours.  Thyroid Function Tests: No results for input(s): "TSH", "T4TOTAL", "FREET4", "T3FREE", "THYROIDAB" in the last 72 hours. Anemia Panel: No results for input(s): "VITAMINB12", "FOLATE", "FERRITIN", "TIBC", "IRON", "RETICCTPCT" in the last 72 hours. Sepsis Labs: Recent Labs  Lab 01/29/2022 2255  LATICACIDVEN 1.3    No results found for this or any previous visit (from the past 240 hour(s)).       Radiology Studies: DG Chest Port 1 View  Result Date: 01/22/2022 CLINICAL DATA:  Fever.  Respiratory distress. EXAM: PORTABLE CHEST 1 VIEW COMPARISON:  Radiograph 01/19/2022.  CT 01/03/2022 FINDINGS: Left upper extremity PICC tip unchanged. Moderate-sized right pleural effusion is minimally improved from  prior exam. There may be slight improvement in right perihilar airspace disease. Minor atelectasis at the left lung base. No other left lung consolidation. No pneumothorax. Stable heart size and mediastinal contours. IMPRESSION: 1. Moderate-sized right pleural effusion is minimally improved from prior exam. 2. Slight improvement in right perihilar airspace disease. 3. Minor left basilar atelectasis. Electronically Signed   By: Keith Rake M.D.   On: 01/22/2022 18:15   VAS [place] UPPER EXTREMITY VENOUS DUPLEX  Result Date: 01/21/2022 UPPER VENOUS STUDY  Patient Name:  [name]  Date of Exam:   01/21/2022 Medical Rec #: [id number]        Accession #:    [id number] Date of Birth: 1940/11/05        Patient Gender: F Patient Age:   41 years Exam Location:  [facility] Procedure:      VAS [place] UPPER EXTREMITY VENOUS DUPLEX Referring Phys: [facility] Farhad Burleson --------------------------------------------------------------------------------  Indications: Left arm swelling and erythema, PICC Comparison Study: No prior studies. Performing Technologist: Darlin Coco RDMS, RVT  Examination Guidelines: A complete evaluation includes B-mode imaging, spectral Doppler, color Doppler, and power Doppler as needed of all accessible portions of each vessel. Bilateral testing is considered an integral part of a complete examination. Limited examinations for reoccurring indications may be performed as noted.  Right Findings: +----------+------------+---------+-----------+----------+-------+ RIGHT     CompressiblePhasicitySpontaneousPropertiesSummary +----------+------------+---------+-----------+----------+-------+ Subclavian               Yes       Yes                      +----------+------------+---------+-----------+----------+-------+  Left Findings: +----------+------------+---------+-----------+----------+---------------+ LEFT      CompressiblePhasicitySpontaneousProperties    Summary      +----------+------------+---------+-----------+----------+---------------+ IJV           Full       Yes       Yes                              +----------+------------+---------+-----------+----------+---------------+ Subclavian    Full       Yes       Yes                              +----------+------------+---------+-----------+----------+---------------+ Axillary      Full       Yes       Yes                              +----------+------------+---------+-----------+----------+---------------+  Brachial      Full                                                  +----------+------------+---------+-----------+----------+---------------+ Radial        Full                                                  +----------+------------+---------+-----------+----------+---------------+ Ulnar         Full                                                  +----------+------------+---------+-----------+----------+---------------+ Cephalic      Full                                                  +----------+------------+---------+-----------+----------+---------------+ Basilic       Full                                  PICC visualized +----------+------------+---------+-----------+----------+---------------+  Summary:  Right: No evidence of thrombosis in the subclavian.  Left: No evidence of deep vein thrombosis in the upper extremity. No evidence of superficial vein thrombosis in the upper extremity.  *See table(s) above for measurements and observations.  Diagnosing physician: Monica Martinez MD Electronically signed by Monica Martinez MD on 01/21/2022 at 3:19:11 PM.    Final         Scheduled Meds:  arformoterol  15 mcg Nebulization BID   bisoprolol  5 mg Oral Daily   budesonide (PULMICORT) nebulizer solution  0.25 mg Nebulization BID   feeding supplement  237 mL Oral BID BM   furosemide  80 mg Intravenous BID   irbesartan  150 mg Oral Daily    pantoprazole (PROTONIX) IV  40 mg Intravenous Q24H   potassium chloride  40 mEq Oral TID   revefenacin  175 mcg Nebulization Daily   rivaroxaban  20 mg Oral Q supper   Continuous Infusions:  sodium chloride Stopped (01/12/22 1757)   sodium chloride 250 mL (01/31/2022 2030)    LOS: 21 days     Alma Friendly, MD [facility]  If 7PM-7AM, please contact night-coverage [email or web address]  01/22/2022, 6:22 PM

## 2022-01-22 NOTE — Progress Notes (Signed)
Progress Note  Patient Name: [name] Date of Encounter: 01/22/2022  [facility] Cardiologist: Kirk Ruths, MD   Subjective   Feels that she has good days and bad days, not so great today, but denies dyspnea.  Continues to have a productive cough and intermittently loses her voice. Excellent urine output. Weights likely not accurate, but today is down 13 lb to 152 (usual weight is around 140).  Normal renal function.  Potassium now normal range.  Inpatient Medications    Scheduled Meds:  arformoterol  15 mcg Nebulization BID   bisacodyl  10 mg Rectal Once   bisoprolol  5 mg Oral Daily   budesonide (PULMICORT) nebulizer solution  0.25 mg Nebulization BID   dextrose  50 mL Intravenous Once   feeding supplement  237 mL Oral BID BM   furosemide  80 mg Intravenous BID   irbesartan  150 mg Oral Daily   [START ON 01/23/2022] magnesium oxide  400 mg Oral Daily   pantoprazole (PROTONIX) IV  40 mg Intravenous Q24H   polyethylene glycol  17 g Oral Daily   potassium chloride  40 mEq Oral TID   revefenacin  175 mcg Nebulization Daily   rivaroxaban  20 mg Oral Q supper   Continuous Infusions:  sodium chloride Stopped (01/12/22 1757)   sodium chloride 250 mL (02/01/2022 2030)   magnesium sulfate bolus IVPB     magnesium sulfate bolus IVPB     PRN Meds: sodium chloride, acetaminophen, fentaNYL (SUBLIMAZE) injection, guaiFENesin, haloperidol lactate, hydrALAZINE, levalbuterol, metoprolol tartrate, ondansetron (ZOFRAN) IV, mouth rinse, sodium chloride flush, white petrolatum   Vital Signs    Vitals:   01/22/22 0505 01/22/22 0542 01/22/22 0731 01/22/22 0816  BP: (!) 103/55   (!) 114/49  Pulse: 81  82 86  Resp: '16  16 20  '$ Temp:    98.5 F (36.9 C)  TempSrc:    Oral  SpO2: 98%  98% 100%  Weight:  69.3 kg    Height:        Intake/Output Summary (Last 24 hours) at 01/22/2022 1034 Last data filed at 01/22/2022 0004 Gross per 24 hour  Intake 480 ml  Output 1400 ml  Net  -920 ml      01/22/2022    5:42 AM 01/21/2022    4:20 AM 01/20/2022    4:50 AM  Last 3 Weights  Weight (lbs) 152 lb 12.5 oz 165 lb 12.6 oz 166 lb 3.6 oz  Weight (kg) 69.3 kg 75.2 kg 75.4 kg      Telemetry    Atrial fibrillation with controlled ventricular response- Personally Reviewed  ECG    No new tracing- Personally Reviewed  Physical Exam  Appears weak, but is smiling GEN: No acute distress.   Neck: No JVD Cardiac: irregular no murmurs, rubs, or gallops.  Respiratory: Diminished breath sounds in both bases, but no wheezes or rales GI: Soft, nontender, non-distended  MS: Generalized 1+ edema; No deformity. Neuro:  Nonfocal  Psych: Normal affect   Labs    High Sensitivity Troponin:   Recent Labs  Lab 01/02/22 1023 01/02/22 1308 01/03/22 2119 01/04/22 0513 01/05/22 0502  TROPONINIHS 8 11 123* 131* 55*     Chemistry Recent Labs  Lab 01/16/22 0536 01/16/22 1854 01/07/2022 0538 01/19/22 0645 01/20/22 0320 01/21/22 0513 01/22/22 0513  NA 137   < > 138  138 141 139 139 138  K 3.3*   < > 3.9  3.8 3.7 3.5 3.0* 3.8  CL 98   < > 100  99 100 97* 97* 98  CO2 32   < > 35*  32 35* 33* 34* 35*  GLUCOSE 224*   < > 130*  133* 102* 90 93 106*  BUN 18   < > 23  22 26* '21 19 17  '$ CREATININE 0.53   < > 0.50  0.48 0.62 0.55 0.72 0.70  CALCIUM 8.6*   < > 9.0  9.1 9.2 9.7 9.5 9.3  MG 1.7   < > 1.8 1.8 1.8  --  1.5*  PROT 5.2*  --  5.6*  --   --   --   --   ALBUMIN 2.8*  --  2.6*  2.6*  --   --   --   --   AST 39  --  33  --   --   --   --   ALT 23  --  23  --   --   --   --   ALKPHOS 73  --  86  --   --   --   --   BILITOT 1.1  --  1.1  --   --   --   --   GFRNONAA >60   < > >60  >60 >60 >60 >60 >60  ANIONGAP 7   < > 3*  '7 6 9 8 5   '$ < > = values in this interval not displayed.    Lipids  Recent Labs  Lab 01/12/2022 0538  TRIG 89    Hematology Recent Labs  Lab 01/19/22 0645 01/20/22 0320 01/21/22 0513  WBC 11.0* 14.0* 9.5  RBC 2.50* 2.44* 2.23*  HGB  8.6* 8.3* 7.6*  HCT 26.0* 26.0* 23.2*  MCV 104.0* 106.6* 104.0*  MCH 34.4* 34.0 34.1*  MCHC 33.1 31.9 32.8  RDW 15.5 15.7* 15.9*  PLT 240 292 294   Thyroid No results for input(s): "TSH", "FREET4" in the last 168 hours.  BNPNo results for input(s): "BNP", "PROBNP" in the last 168 hours.  DDimer No results for input(s): "DDIMER" in the last 168 hours.   Radiology    VAS [place] UPPER EXTREMITY VENOUS DUPLEX  Result Date: 01/21/2022 UPPER VENOUS STUDY  Patient Name:  SHELLI PORTILLA  Date of Exam:   01/21/2022 Medical Rec #: [id number]        Accession #:    [id number] Date of Birth: 1941-01-02        Patient Gender: F Patient Age:   32 years Exam Location:  [facility] Procedure:      VAS [place] UPPER EXTREMITY VENOUS DUPLEX Referring Phys: [facility] EZEND[place]A --------------------------------------------------------------------------------  Indications: Left arm swelling and erythema, PICC Comparison Study: No prior studies. Performing Technologist: Darlin Coco RDMS, RVT  Examination Guidelines: A complete evaluation includes B-mode imaging, spectral Doppler, color Doppler, and power Doppler as needed of all accessible portions of each vessel. Bilateral testing is considered an integral part of a complete examination. Limited examinations for reoccurring indications may be performed as noted.  Right Findings: +----------+------------+---------+-----------+----------+-------+ RIGHT     CompressiblePhasicitySpontaneousPropertiesSummary +----------+------------+---------+-----------+----------+-------+ Subclavian               Yes       Yes                      +----------+------------+---------+-----------+----------+-------+  Left Findings: +----------+------------+---------+-----------+----------+---------------+ LEFT      CompressiblePhasicitySpontaneousProperties    Summary     +----------+------------+---------+-----------+----------+---------------+ IJV  Full       Yes        Yes                              +----------+------------+---------+-----------+----------+---------------+ Subclavian    Full       Yes       Yes                              +----------+------------+---------+-----------+----------+---------------+ Axillary      Full       Yes       Yes                              +----------+------------+---------+-----------+----------+---------------+ Brachial      Full                                                  +----------+------------+---------+-----------+----------+---------------+ Radial        Full                                                  +----------+------------+---------+-----------+----------+---------------+ Ulnar         Full                                                  +----------+------------+---------+-----------+----------+---------------+ Cephalic      Full                                                  +----------+------------+---------+-----------+----------+---------------+ Basilic       Full                                  PICC visualized +----------+------------+---------+-----------+----------+---------------+  Summary:  Right: No evidence of thrombosis in the subclavian.  Left: No evidence of deep vein thrombosis in the upper extremity. No evidence of superficial vein thrombosis in the upper extremity.  *See table(s) above for measurements and observations.  Diagnosing physician: Monica Martinez MD Electronically signed by Monica Martinez MD on 01/21/2022 at 3:19:11 PM.    Final     Cardiac Studies   Echocardiogram 01/30/2022    1. Left ventricular ejection fraction, by estimation, is 60 to 65%. Left  ventricular ejection fraction by 2D MOD biplane is 60.6 %. The left  ventricle has normal function. The left ventricle has no regional wall  motion abnormalities. There is mild left  ventricular hypertrophy. Left ventricular diastolic function could not be   evaluated.   2. Right ventricular systolic function is moderately reduced. The right  ventricular size is normal. There is severely elevated pulmonary artery  systolic pressure. The estimated right ventricular systolic pressure is  92.1 mmHg.   3. The mitral valve is grossly normal.  Trivial mitral valve  regurgitation.   4. The aortic valve is tricuspid. Aortic valve regurgitation is mild to  moderate. Aortic valve sclerosis is present, with no evidence of aortic  valve stenosis. Aortic regurgitation PHT measures 227 msec.   5. The inferior vena cava is normal in size with <50% respiratory  variability, suggesting right atrial pressure of 8 mmHg.   Comparison(s): Changes from prior study are noted. 01/04/2022: LVEF 25-30%,  Takatsubo-type wall motion abnormalities.    Patient Profile     81 y.o. 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  Assessment & Plan    Acute on chronic HF: Weight down but not sure of accuracy, overall appears to be making progress with diuresis.  Hypokalemia resolved, continue to monitor electrolytes. Hypoalbuminemia likely impeding diuresis. 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  Echo shows full recovery of left ventricular systolic function.  Have not been able to make a significant dent in fluid overload (dry weight estimated 140 pounds).  AFib: Rate controlled.  On Xarelto. Needs crushed meds so replaced Inderal LA w bisoprolol (better cardioselectivity anyway). Sepsis: resolved. Initial admission for pneumonia.  S/P partial colectomy: intestinal reanastomosis during second surgery on 08/09.  Has good bowel movements. HTN: on beta  blocker and ARB Asthma: bisoprolol preferred over propranolol PAH: This is likely to be multifactorial, but at least in part is due to diastolic left heart failure.  Estimated systolic PA pressure almost 80 mmHg on current echocardiogram.  The echocardiogram in 2021 reported estimated PA pressure 36 mmHg, up to 50 mmHg on echo in 2022.  Estimation with echo is fraught with overestimation error due to the irregular heart rhythm, but the pulmonary pressure is clearly elevated.  Right ventricular function has deteriorated.  ABG during this admission c/w chronic hypoventilation.  Suspect her lung disease is more complex than just asthma.  On chronic anticoagulation so thromboembolic disease is unlikely.  Unable to really evaluate pulmonary function at this time due to acute illness.     For questions or updates, please contact [place] Please consult [email or web address] for contact info under        Signed, Sanda Klein, MD  01/22/2022, 10:34 AM

## 2022-01-22 NOTE — Progress Notes (Addendum)
Speech Language Pathology Treatment: Dysphagia  Patient Details Name: [name] MRN: [id number] DOB: Feb 17, 1941 Today's Date: 01/22/2022 Time: 8032-1224 SLP Time Calculation (min) (ACUTE ONLY): 13 min  Assessment / Plan / Recommendation Clinical Impression  Pt was observed taking meds crushed in puree and eating cereal with milk. Oral holding is noted that requires Min cues, with mastication also a little prolonged. Despite some signs of dysphagia, which may be at least partially cognitive in nature, there are no overt s/s of aspiration. Subjectively, [name] believes [name] is swallowing better. Pt seems appropriate to remain on soft diet and thin liquids. Will continue to follow.    HPI HPI: Patient is a 81 y.o. [name] presented to [facility] on 12/07/2021 with SOB, productive cough and right sided chest pain for three days prior to admission. On EMS arrival, pt was hypoxic on RA at 85% and was placed on NRB. CXR showed right-sided PNA. On 7/30, rapid respose was called due to pt found unresponsive in hospital bed. CPR started and ROSC achieved after 7 minutes. Pt was intubated by CRNA and transferred to [facility]. On 8/2 [name] was extubated to supplemental oxygen via [place]. EEG 8/2 with no seizure activity. Improved cognition 8/5, but persistent tachycardia/afib with RVR. 8/6 ischemic colitis. 8/7 shock with intubation and to OR for ex lap. 8/9 abdomen closure. 8/10 extubation.  PMH: afib on [place], asthma, GERD, essential HTN, chronic diastolic CHF, right breast cancer, h/o syncope, generalized anxiety disorder, moderate pulmonary  hypertension.  Follow up regarding swallowing indicated, Pt has been advanced to a soft diet/thin liquids.  [name] has respiratory complications on evening 8/14 with tachypnea.  CXR from 01/19/2022 showed No significant change in AP portable examination. Large right  pleural effusion and associated atelectasis or consolidation. The  left lung is normally aerated. No new airspace opacity.  Pt denies  dysphagia PTA and has not used oxygen at home.      SLP Plan  Continue with current plan of care      Recommendations for follow up therapy are one component of a multi-disciplinary discharge planning process, led by the attending physician.  Recommendations may be updated based on patient status, additional functional criteria and insurance authorization.    Recommendations  Diet recommendations: Dysphagia 3 (mechanical soft);Thin liquid Liquids provided via: Cup;Straw Medication Administration: Crushed with puree Supervision: Patient able to self feed;Full supervision/cueing for compensatory strategies Compensations: Slow rate;Small sips/bites;Minimize environmental distractions Postural Changes and/or Swallow Maneuvers: Seated upright 90 degrees;Upright 30-60 min after meal                Oral Care Recommendations: Oral care BID Follow Up Recommendations: No SLP follow up Assistance recommended at discharge: Frequent or constant Supervision/Assistance SLP Visit Diagnosis: Dysphagia, unspecified (R13.10) Plan: Continue with current plan of care           Osie Bond., M.A. [place] Office [phone]  Secure chat preferred   01/22/2022, 10:35 AM

## 2022-01-22 NOTE — Progress Notes (Signed)
   01/22/22 1235  Clinical Encounter Type  Visited With Patient and family together  Visit Type Initial;Spiritual support  Referral From Nurse  Consult/Referral To Chaplain   Chaplain responded to a spiritual consult request for prayer. I met with the patient, [name] and her husband [name]. Phil shared that she was a little mixed up today and a disheartened because of some news regarding her treatment. She asked for prayer about both. We spent some time in reflection [name] talked about her time here in the hospital. As I departed [name] was attempting to page a nurse. I told him I would stop and alert the nurse at the desk for him. [name] asked if I could return and I said I would on Monday.   Danice Goltz [facility]  [phone]

## 2022-01-22 NOTE — Progress Notes (Signed)
Physical Therapy Treatment Patient Details Name: [name] MRN: [id number] DOB: 1940-11-22 Today's Date: 01/22/2022   History of Present Illness 81 y.o. female admitted 12/06/2021 with CAP.  7/30 AMS, head CT negative for acute abnormality. Code blue 7/30, ROSC achieved after 7 min CPR, pt intubated. Chest tube insertion 7/31; pt removed CT 8/1. ETT 7/30-8/2. Continued AMS, hallucinations overnight 8/2; head CT 8/2 remains negative for acute abnormality. EEG 8/2 with no seizure activity. Improved cognition 8/5, but persistent tachycardia/afib with RVR. 8/6 ischemic colitis. 8/7 shock with intubation and to OR for ex lap. 8/9 abdomen closure. 8/10 extubation.  PMH includes afib, HTN, breast CA, melanoma, asthma, bilateral TKA, anxiety.    PT Comments    Pt received supine and agreeable to session, however pt limited by fatigue. Pt able to come to sitting EOB with light min assist and able to maintain sitting EOB throughout duration of session without assist and no LOB. Pt with fair tolerance for seated therex for increased ROM and strength. Pt declining standing attempts this session stating weakness, increased time spent educating pt on weakness increasing with bedrest and importance of continued mobility, however pt continuing to decline. Pt continues to benefit from skilled PT services to progress toward functional mobility goals.     Recommendations for follow up therapy are one component of a multi-disciplinary discharge planning process, led by the attending physician.  Recommendations may be updated based on patient status, additional functional criteria and insurance authorization.  Follow Up Recommendations  Acute inpatient rehab (3hours/day) Can patient physically be transported by private vehicle: No   Assistance Recommended at Discharge Frequent or constant Supervision/Assistance  Patient can return home with the following Direct supervision/assist for medications  management;Assistance with feeding;Assistance with cooking/housework;Direct supervision/assist for financial management;Assist for transportation;A lot of help with walking and/or transfers;A lot of help with bathing/dressing/bathroom   Equipment Recommendations  Rolling walker (2 wheels);BSC/3in1    Recommendations for Other Services       Precautions / Restrictions Precautions Precautions: Fall;Other (comment) Precaution Comments: watch HR Restrictions Weight Bearing Restrictions: No     Mobility  Bed Mobility Overal bed mobility: Needs Assistance Bed Mobility: Supine to Sit, Sit to Supine     Supine to sit: Min assist, HOB elevated Sit to supine: Min guard   General bed mobility comments: light min assist to come to sitting EOB    Transfers Overall transfer level: Needs assistance   Transfers: Bed to chair/wheelchair/BSC   Stand pivot transfers: Min guard         General transfer comment: pt declining standing attempts stating she was too weak, extra time spent educating pt on importance of continued mobility and weakness caused from bedrest pt continued to decline, pt able to scoot laterally toward [facility] without physical assist    Ambulation/Gait                   Stairs             Wheelchair Mobility    Modified Rankin (Stroke Patients Only)       Balance Overall balance assessment: Needs assistance Sitting-balance support: Feet supported Sitting balance-Leahy Scale: Fair Sitting balance - Comments: minguard sitting EOB                                    Cognition Arousal/Alertness: Awake/alert Behavior During Therapy: WFL for tasks assessed/performed Overall Cognitive Status: Impaired/Different from  baseline                                          Exercises General Exercises - Lower Extremity Long Arc Quad: AROM, Right, Left, 10 reps, Seated Hip Flexion/Marching: AROM, Right, Left, 10 reps,  Seated Heel Raises: AROM, Right, Left, 10 reps, Seated    General Comments        Pertinent Vitals/Pain Pain Assessment Pain Assessment: Faces Faces Pain Scale: Hurts little more Pain Location: abdomen/back Pain Descriptors / Indicators: Discomfort, Grimacing Pain Intervention(s): Monitored during session, Limited activity within patient's tolerance, Repositioned    Home Living                          Prior Function            PT Goals (current goals can now be found in the care plan section) Acute Rehab PT Goals PT Goal Formulation: With patient Time For Goal Achievement: 01/28/22    Frequency    Min 3X/week      PT Plan      Co-evaluation              AM-PAC PT "6 Clicks" Mobility   Outcome Measure  Help needed turning from your back to your side while in a flat bed without using bedrails?: A Lot Help needed moving from lying on your back to sitting on the side of a flat bed without using bedrails?: A Lot Help needed moving to and from a bed to a chair (including a wheelchair)?: A Lot Help needed standing up from a chair using your arms (e.g., wheelchair or bedside chair)?: A Lot Help needed to walk in hospital room?: Total Help needed climbing 3-5 steps with a railing? : Total 6 Click Score: 10    End of Session Equipment Utilized During Treatment: Oxygen Activity Tolerance: Patient limited by fatigue Patient left: in bed;with call bell/phone within reach Nurse Communication: Mobility status PT Visit Diagnosis: Other abnormalities of gait and mobility (R26.89);Difficulty in walking, not elsewhere classified (R26.2);Muscle weakness (generalized) (M62.81)     Time: 4401-0272 PT Time Calculation (min) (ACUTE ONLY): 25 min  Charges:  $Therapeutic Exercise: 8-22 mins $Therapeutic Activity: 8-22 mins                     Alycen Mack R. PTA Acute Rehabilitation Services Office: [place] 01/22/2022, 4:48 PM

## 2022-01-22 NOTE — Progress Notes (Signed)
Inpatient Rehab Admissions Coordinator:    Insurance denied case for CIR. Pt. Wishes to appeal. I will send appeal today.  Clemens Catholic, [place], [place] Admissions Coordinator  [phone] ([place]) [phone] (office)

## 2022-01-22 NOTE — Progress Notes (Signed)
4 Days Post-Op   Subjective/Chief Complaint: Feeling improved.  Eating about 1/3 of her meals but drinking both of her Ensures.   Objective: Vital signs in last 24 hours: Temp:  [97.6 F (36.4 C)-99.3 F (37.4 C)] 98.5 F (36.9 C) (08/18 0816) Pulse Rate:  [68-86] 86 (08/18 0816) Resp:  [16-28] 20 (08/18 0816) BP: (83-114)/(44-55) 114/49 (08/18 0816) SpO2:  [92 %-100 %] 100 % (08/18 0816) Weight:  [69.3 kg] 69.3 kg (08/18 0542) Last BM Date : 01/20/22  Intake/Output from previous day: 08/17 0701 - 08/18 0700 In: 480 [P.O.:480] Out: 1400 [Urine:1400] Intake/Output this shift: No intake/output data recorded.  PE: GI: Soft, appropriate TTP greatest along midline incision. +BS, wound is clean and packed   Lab Results:  Recent Labs    01/20/22 0320 01/21/22 0513  WBC 14.0* 9.5  HGB 8.3* 7.6*  HCT 26.0* 23.2*  PLT 292 294   BMET Recent Labs    01/21/22 0513 01/22/22 0513  NA 139 138  K 3.0* 3.8  CL 97* 98  CO2 34* 35*  GLUCOSE 93 106*  BUN 19 17  CREATININE 0.72 0.70  CALCIUM 9.5 9.3   PT/INR No results for input(s): "LABPROT", "INR" in the last 72 hours. ABG No results for input(s): "PHART", "HCO3" in the last 72 hours.  Invalid input(s): "PCO2", "PO2"   Studies/Results: VAS [place] UPPER EXTREMITY VENOUS DUPLEX  Result Date: 01/21/2022 UPPER VENOUS STUDY  Patient Name:  [name]  Date of Exam:   01/21/2022 Medical Rec #: [id number]        Accession #:    [id number] Date of Birth: [birth year]        Patient Gender: F Patient Age:   81 years Exam Location:  [facility] Procedure:      VAS [place] UPPER EXTREMITY VENOUS DUPLEX Referring Phys: [facility] EZENDUKA --------------------------------------------------------------------------------  Indications: Left arm swelling and erythema, PICC Comparison Study: No prior studies. Performing Technologist: Darlin Coco RDMS, RVT  Examination Guidelines: A complete evaluation includes B-mode imaging, spectral  Doppler, color Doppler, and power Doppler as needed of all accessible portions of each vessel. Bilateral testing is considered an integral part of a complete examination. Limited examinations for reoccurring indications may be performed as noted.  Right Findings: +----------+------------+---------+-----------+----------+-------+ RIGHT     CompressiblePhasicitySpontaneousPropertiesSummary +----------+------------+---------+-----------+----------+-------+ Subclavian               Yes       Yes                      +----------+------------+---------+-----------+----------+-------+  Left Findings: +----------+------------+---------+-----------+----------+---------------+ LEFT      CompressiblePhasicitySpontaneousProperties    Summary     +----------+------------+---------+-----------+----------+---------------+ IJV           Full       Yes       Yes                              +----------+------------+---------+-----------+----------+---------------+ Subclavian    Full       Yes       Yes                              +----------+------------+---------+-----------+----------+---------------+ Axillary      Full       Yes       Yes                              +----------+------------+---------+-----------+----------+---------------+  Brachial      Full                                                  +----------+------------+---------+-----------+----------+---------------+ Radial        Full                                                  +----------+------------+---------+-----------+----------+---------------+ Ulnar         Full                                                  +----------+------------+---------+-----------+----------+---------------+ Cephalic      Full                                                  +----------+------------+---------+-----------+----------+---------------+ Basilic       Full                                   PICC visualized +----------+------------+---------+-----------+----------+---------------+  Summary:  Right: No evidence of thrombosis in the subclavian.  Left: No evidence of deep vein thrombosis in the upper extremity. No evidence of superficial vein thrombosis in the upper extremity.  *See table(s) above for measurements and observations.  Diagnosing physician: Monica Martinez MD Electronically signed by Monica Martinez MD on 01/21/2022 at 3:19:11 PM.    Final     Anti-infectives: Anti-infectives (From admission, onward)    Start     Dose/Rate Route Frequency Ordered Stop   02/04/2022 1400  micafungin (MYCAMINE) 100 mg in sodium chloride 0.9 % 100 mL IVPB  Status:  Discontinued        100 mg 105 mL/hr over 1 Hours Intravenous Every 24 hours 01/17/2022 1302 01/15/22 1211   01/10/22 1515  piperacillin-tazobactam (ZOSYN) IVPB 3.375 g        3.375 g 12.5 mL/hr over 240 Minutes Intravenous Every 8 hours 01/10/22 1427 01/10/2022 2359   01/04/22 2000  vancomycin (VANCOCIN) IVPB 1000 mg/200 mL premix  Status:  Discontinued        1,000 mg 200 mL/hr over 60 Minutes Intravenous Every 24 hours 01/03/22 1910 01/05/22 1321   01/03/22 2200  ceFEPIme (MAXIPIME) 2 g in sodium chloride 0.9 % 100 mL IVPB        2 g 200 mL/hr over 30 Minutes Intravenous Every 12 hours 01/03/22 1910 01/08/22 2209   01/03/22 1930  vancomycin (VANCOREADY) IVPB 1250 mg/250 mL        1,250 mg 166.7 mL/hr over 90 Minutes Intravenous  Once 01/03/22 1910 01/03/22 2219   01/02/22 2100  azithromycin (ZITHROMAX) 500 mg in sodium chloride 0.9 % 250 mL IVPB        500 mg 250 mL/hr  Intravenous Every 24 hours 12/22/2021 2248 01/06/22 2149   01/02/22 2000  cefTRIAXone (ROCEPHIN) 2 g in sodium chloride 0.9 % 100 mL IVPB  Status:  Discontinued        2 g 200 mL/hr over 30 Minutes Intravenous Every 24 hours 12/31/2021 2248 01/03/22 1733   12/16/2021 2030  cefTRIAXone (ROCEPHIN) 1 g in sodium chloride 0.9 % 100 mL IVPB        1 g 200 mL/hr over  30 Minutes Intravenous  Once 12/12/2021 2025 12/28/2021 2141   12/27/2021 2030  azithromycin (ZITHROMAX) 500 mg in sodium chloride 0.9 % 250 mL IVPB        500 mg 250 mL/hr over 60 Minutes Intravenous  Once 12/25/2021 2025 12/27/2021 2312       Assessment/Plan: POD 11/9 S/p exploratory laparotomy,  R colectomy, abthera placement 01/23/2022 Dr. Rosendo Gros S/p exploratory laparotomy, intestinal anastomosis, abdominal closure 02/03/2022 Dr. Rosendo Gros  for ischemic bowel with perforation - afebrile - WBC normal at 9 yesterday - TNA off - D3, protein shakes, eating 1/3rd of meals but drinking all of her Ensure.  This is to be expected and ok.  Appetite will take time to return. -miralax daily - wet-to-dry dressing to midline BID.  -mobilize/pulm toilet.  Therapies recommend CIR -cont zosyn for 5 days post op, stop date 8/14 -surgically stable for CIR or SNF when medically stable.  She will be PRN for the weekend. Please call if needed prior to that.  Her DC follow up and instructions are already in her chart   FEN: soft/ensure ID: Zosyn (stop date 8/14) VTE: SCD's, Xarelto Foley: purewick Dispo: progressive     Below per TRH -- Septic shock due to above Cardiac arrest 7/30, 7 mins CPR Hx cardiomyopathy COPD Afib, permanent CAP   LOS: 21 days    Henreitta Cea, [facility] 01/22/2022, 8:54 AM Please see Amion for pager number during day hours 7:00am-4:30pm

## 2022-01-23 DIAGNOSIS — J9601 Acute respiratory failure with hypoxia: Secondary | ICD-10-CM | POA: Diagnosis not present

## 2022-01-23 DIAGNOSIS — I469 Cardiac arrest, cause unspecified: Secondary | ICD-10-CM | POA: Diagnosis not present

## 2022-01-23 DIAGNOSIS — I4811 Longstanding persistent atrial fibrillation: Secondary | ICD-10-CM | POA: Diagnosis not present

## 2022-01-23 DIAGNOSIS — J189 Pneumonia, unspecified organism: Secondary | ICD-10-CM | POA: Diagnosis not present

## 2022-01-23 LAB — CBC
HCT: 24.9 % — ABNORMAL LOW (ref 36.0–46.0)
Hemoglobin: 8.2 g/dL — ABNORMAL LOW (ref 12.0–15.0)
MCH: 34.7 pg — ABNORMAL HIGH (ref 26.0–34.0)
MCHC: 32.9 g/dL (ref 30.0–36.0)
MCV: 105.5 fL — ABNORMAL HIGH (ref 80.0–100.0)
Platelets: 401 10*3/uL — ABNORMAL HIGH (ref 150–400)
RBC: 2.36 MIL/uL — ABNORMAL LOW (ref 3.87–5.11)
RDW: 16.3 % — ABNORMAL HIGH (ref 11.5–15.5)
WBC: 21 10*3/uL — ABNORMAL HIGH (ref 4.0–10.5)
nRBC: 0.1 % (ref 0.0–0.2)

## 2022-01-23 LAB — URINALYSIS, MICROSCOPIC (REFLEX)

## 2022-01-23 LAB — BASIC METABOLIC PANEL
Anion gap: 8 (ref 5–15)
BUN: 20 mg/dL (ref 8–23)
CO2: 34 mmol/L — ABNORMAL HIGH (ref 22–32)
Calcium: 9.5 mg/dL (ref 8.9–10.3)
Chloride: 99 mmol/L (ref 98–111)
Creatinine, Ser: 0.88 mg/dL (ref 0.44–1.00)
GFR, Estimated: >60 mL/min (ref >60)
Glucose, Bld: 118 mg/dL — ABNORMAL HIGH (ref 70–99)
Potassium: 4.6 mmol/L (ref 3.5–5.1)
Sodium: 141 mmol/L (ref 135–145)

## 2022-01-23 LAB — URINALYSIS, ROUTINE W REFLEX MICROSCOPIC
Bilirubin Urine: NEGATIVE
Glucose, UA: NEGATIVE mg/dL
Ketones, ur: NEGATIVE mg/dL
Nitrite: NEGATIVE
Protein, ur: NEGATIVE mg/dL
Specific Gravity, Urine: <1.005 — ABNORMAL LOW (ref 1.005–1.030)
pH: 7 (ref 5.0–8.0)

## 2022-01-23 LAB — PROCALCITONIN: Procalcitonin: 0.74 ng/mL

## 2022-01-23 LAB — MAGNESIUM: Magnesium: 1.7 mg/dL (ref 1.7–2.4)

## 2022-01-23 LAB — LACTIC ACID, PLASMA: Lactic Acid, Venous: 1.1 mmol/L (ref 0.5–1.9)

## 2022-01-23 MED ORDER — SODIUM CHLORIDE 0.9 % IV BOLUS
1000.0000 mL | Freq: Once | INTRAVENOUS | Status: AC
Start: 2022-01-23 — End: 2022-01-23
  Administered 2022-01-23: 1000 mL via INTRAVENOUS

## 2022-01-23 MED ORDER — METRONIDAZOLE 500 MG/100ML IV SOLN
500.0000 mg | Freq: Two times a day (BID) | INTRAVENOUS | Status: DC
  Administered 2022-01-23 – 2022-01-25 (×6): 500 mg via INTRAVENOUS
  Filled 2022-01-23 (×6): qty 100

## 2022-01-23 MED ORDER — SODIUM CHLORIDE 0.9 % IV SOLN: INTRAVENOUS | Status: DC

## 2022-01-23 MED ORDER — SALINE SPRAY 0.65 % NA SOLN
1.0000 | NASAL | Status: DC | PRN
Start: 2022-01-23 — End: 2022-02-11
  Filled 2022-01-23: qty 44

## 2022-01-23 MED ORDER — VANCOMYCIN HCL 1500 MG/300ML IV SOLN
1500.0000 mg | INTRAVENOUS | Status: DC
  Filled 2022-01-23: qty 300

## 2022-01-23 MED ORDER — VANCOMYCIN HCL 1250 MG/250ML IV SOLN
1250.0000 mg | Freq: Once | INTRAVENOUS | Status: AC
Start: 2022-01-23 — End: 2022-01-23
  Administered 2022-01-23: 1250 mg via INTRAVENOUS
  Filled 2022-01-23: qty 250

## 2022-01-23 MED ORDER — IOHEXOL 9 MG/ML PO SOLN
ORAL | Status: AC
  Administered 2022-01-23: 1000 mL
  Filled 2022-01-23: qty 1000

## 2022-01-23 MED ORDER — SODIUM CHLORIDE 0.9 % IV SOLN
2.0000 g | Freq: Two times a day (BID) | INTRAVENOUS | Status: DC
  Administered 2022-01-23 – 2022-01-25 (×6): 2 g via INTRAVENOUS
  Filled 2022-01-23 (×6): qty 12.5

## 2022-01-23 MED ORDER — SODIUM CHLORIDE 0.9 % IV BOLUS
500.0000 mL | Freq: Once | INTRAVENOUS | Status: AC
  Administered 2022-01-24: 500 mL via INTRAVENOUS

## 2022-01-23 NOTE — Progress Notes (Signed)
Over last 24 hours has developed loose stools, fever, hypotension, marked reduction in weight (now 145 lb, close to her pre-admission weight). Afib, rate controlled. Still on beta blocker. Diuretics and ARB stopped, appropriately. Renal parameters remain good. WBC substantially increased. No evidence of bleeding. Started on antibiotics, workup for infection.  Will follow peripherally.

## 2022-01-23 NOTE — Progress Notes (Signed)
PROGRESS NOTE    [name]  PJA:[id number] DOB: April 25, 1941 DOA: 12/29/2021 PCP: [facility] [place]   Brief Narrative:  81 year old female admitted for community-acquired pneumonia and respiratory failure and concurrent cardiac arrest.  Patient had a parapneumonic effusion requiring chest tube.  Developed ischemic colitis was taken to the OR for a laparotomy with worsening shock/confusion. Patient was extubated on 01/14/2022.  7/28 admit, Ceftriaxone/Azithro 7/29 last dose xeralto  7/30 IHCA x 7 min, Ceftriaxone>Cefepime 7/31 Chest tube placed, heparin gtt started 8/1 patient remove chest tube.  Head CT obtained >negative.  Severely agitated overnight.  Vancomycin stopped.  Azithromycin stopped. 8/6 Transferred to [facility] 8/7 Called CCM back for abd, Distention , hypotension and a fib with RVR, plan is for urgent OR 8/10 Abdomen closed yesterday, SBT/SAT, extubated 8/13 small BM/flatus 8/14 responded well to diuresis. Unable to complete thora as no evidence of pleural fluid but significant lung trapping noted per [facility] 8/15 overnight requiring BIPAP, improving and progressing.    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   Hypotension   A-fib (HCC)   Lactic acid increased   Cardiac arrest ([place])   Acute respiratory failure with hypoxia (HCC)   Pneumonia of right lower lobe due to infectious organism   Takotsubo cardiomyopathy   Ischemic colitis ([place])   Septic shock ([place])   Septic shock Ischemic colitis with perforation Concurrent CAP with parapneumonic effusion Questionable concurrent cardiogenic shock Shock resolved, off pressors Blood cultures negative so far Known history of Takotsubo cardiomyopathy -EF 25 to 30%, now resolved Status post ex lap colectomy 02/01/2022 return on 01/28/2022 for abdominal closure Continue diet as tolerated Completed  Zosyn 01/17/2022  Recurrent sepsis phenomenon-noted on 8/18 Now with fever, marked leukocytosis, hypotension Rule out intra-abdominal abscess Chest x-ray with resolving pleural effusion, no new infiltrate opacity UA with large leukocytes, WBC 21-50, UC pending BC x2 pending CT abdomen/pelvis pending to rule out any intra-abdominal abscess IV fluid bolus, plus maintenance Started on vancomycin, cefepime, Flagyl Monitor very closely  Acute hypoxic respiratory failure due to right sided CAP with parapneumonic effusion, COPD without acute exacerbation  Extubated 01/14/2022  CXR shows R pleural effusion, PCCM called to assist given recent respiratory failure and high risk for respiratory compromise, no further work up Continue PT OT mobilization, flutter, incentive spirometry Neb triple therapy COPD not in acute exacerbation, no indication for steroids at this time    SP PEA arrest 2/2 respiratory distress/ hypoxia Atrial fibrillation with RVR 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 EF 25-30% on 7/31 [place] Held diuretics, bisoprolol, ARB Switched to Xarelto Cardiomyopathy resolving, she has systolic recovery, EF 34%, without evidence of wall motion abnormalities  Moderate protein caloric malnutrition  Hypokalemia Hypomagnesemia Possible re-feeding syndrome Advance diet as tolerated  Macrocytic anemia ABLA post op, multifactorial Hgb trending downwards No obvious signs of bleeding Daily CBC, transfuse if hemoglobin less than 7  Left arm swelling/redness DVT Doppler negative       DVT prophylaxis: Xarelto Code Status: Full Family Communication: Husband at bedside  Status is: Inpatient  Dispo: The patient is from: Home              Anticipated d/c is to: To be determined              Anticipated d/c date is: TBD  Patient currently not medically stable for discharge  Consultants:  General surgery, PCCM, cardiology  Antimicrobials:  Zosyn completed   01/20/2022 Micafungin discontinued Previously completed short course of azithromycin and cefepime.  Subjective: Today, patient appeared very lethargic, denies any significant abdominal pain, cough, chest pain, nausea/vomiting   Objective: Vitals:   01/23/22 1203 01/23/22 1510 01/23/22 1609 01/23/22 1613  BP: (!) 91/57 (!) 89/40 (!) 85/47 (!) 99/44  Pulse: 93 90 96 91  Resp: 20     Temp: 99 F (37.2 C)     TempSrc: Oral     SpO2: 100%  100% 100%  Weight:      Height:        Intake/Output Summary (Last 24 hours) at 01/23/2022 1646 Last data filed at 01/23/2022 1614 Gross per 24 hour  Intake 1103.82 ml  Output --  Net 1103.82 ml   Filed Weights   01/21/22 0420 01/22/22 0542 01/23/22 0505  Weight: 75.2 kg 69.3 kg 66 kg    Examination: General: NAD, lethargic Cardiovascular: S1, S2 present Respiratory: Diminished breath sounds bilaterally Abdomen: Soft, large abdominal bandage, C/D/I Musculoskeletal: 1+ bilateral pedal edema noted Skin: Normal Psychiatry: Normal mood    Data Reviewed: I have personally reviewed following labs and imaging studies  CBC: Recent Labs  Lab 01/17/22 1020 01/22/2022 0538 01/19/22 0645 01/20/22 0320 01/21/22 0513 01/23/22 0530  WBC 11.0* 11.4* 11.0* 14.0* 9.5 21.0*  NEUTROABS 9.3* 9.6* 8.8* 11.1* 7.1  --   HGB 8.8* 8.3* 8.6* 8.3* 7.6* 8.2*  HCT 26.1* 25.5* 26.0* 26.0* 23.2* 24.9*  MCV 109.7* 103.7* 104.0* 106.6* 104.0* 105.5*  PLT 235 245 240 292 294 193*   Basic Metabolic Panel: Recent Labs  Lab 01/17/22 0529 01/21/2022 0538 01/19/22 0645 01/20/22 0320 01/21/22 0513 01/22/22 0513 01/23/22 0530  NA 139 138  138 141 139 139 138 141  K 4.2 3.9  3.8 3.7 3.5 3.0* 3.8 4.6  CL 99 100  99 100 97* 97* 98 99  CO2 33* 35*  32 35* 33* 34* 35* 34*  GLUCOSE 135* 130*  133* 102* 90 93 106* 118*  BUN '20 23  22 '$ 26* '21 19 17 20  '$ CREATININE 0.49 0.50  0.48 0.62 0.55 0.72 0.70 0.88  CALCIUM 8.9 9.0  9.1 9.2 9.7 9.5 9.3 9.5  MG 2.2 1.8  1.8 1.8  --  1.5* 1.7  PHOS 2.9 3.3  3.2  --  2.7  --   --   --    GFR: Estimated Creatinine Clearance: 44.7 mL/min (by C-G formula based on SCr of 0.88 mg/dL). Liver Function Tests: Recent Labs  Lab 01/06/2022 0538  AST 33  ALT 23  ALKPHOS 86  BILITOT 1.1  PROT 5.6*  ALBUMIN 2.6*  2.6*   No results for input(s): "LIPASE", "AMYLASE" in the last 168 hours. No results for input(s): "AMMONIA" in the last 168 hours. Coagulation Profile: No results for input(s): "INR", "PROTIME" in the last 168 hours. Cardiac Enzymes: No results for input(s): "CKTOTAL", "CKMB", "CKMBINDEX", "TROPONINI" in the last 168 hours. BNP (last 3 results) No results for input(s): "PROBNP" in the last 8760 hours. HbA1C: No results for input(s): "HGBA1C" in the last 72 hours. CBG: Recent Labs  Lab 01/16/22 2014 01/16/22 2304 01/17/22 1221 01/17/22 2317  GLUCAP 113* 119* 157* 120*   Lipid Profile: No results for input(s): "CHOL", "HDL", "LDLCALC", "TRIG", "CHOLHDL", "LDLDIRECT" in the last 72 hours.  Thyroid Function Tests: No results for input(s): "TSH", "T4TOTAL", "FREET4", "T3FREE", "THYROIDAB" in  the last 72 hours. Anemia Panel: No results for input(s): "VITAMINB12", "FOLATE", "FERRITIN", "TIBC", "IRON", "RETICCTPCT" in the last 72 hours. Sepsis Labs: Recent Labs  Lab 02/03/2022 2255  LATICACIDVEN 1.3    Recent Results (from the past 240 hour(s))  Culture, blood (Routine X 2) w Reflex to ID Panel     Status: None (Preliminary result)   Collection Time: 01/22/22  9:14 PM   Specimen: BLOOD LEFT HAND  Result Value Ref Range Status   Specimen Description BLOOD LEFT HAND  Final   Special Requests   Final    BOTTLES DRAWN AEROBIC ONLY Blood Culture results may not be optimal due to an inadequate volume of blood received in culture bottles   Culture   Final    NO GROWTH < 12 HOURS Performed at [facility] [address], [place], [place] [address]    Report Status PENDING  Incomplete   Culture, blood (Routine X 2) w Reflex to ID Panel     Status: None (Preliminary result)   Collection Time: 01/22/22  9:21 PM   Specimen: BLOOD RIGHT HAND  Result Value Ref Range Status   Specimen Description BLOOD RIGHT HAND  Final   Special Requests   Final    BOTTLES DRAWN AEROBIC ONLY Blood Culture results may not be optimal due to an inadequate volume of blood received in culture bottles   Culture   Final    NO GROWTH < 12 HOURS Performed at [facility], [place] [address], [place], [place] [address]    Report Status PENDING  Incomplete         Radiology Studies: DG Chest Port 1 View  Result Date: 01/22/2022 CLINICAL DATA:  Fever.  Respiratory distress. EXAM: PORTABLE CHEST 1 VIEW COMPARISON:  Radiograph 01/19/2022.  CT 01/03/2022 FINDINGS: Left upper extremity PICC tip unchanged. Moderate-sized right pleural effusion is minimally improved from prior exam. There may be slight improvement in right perihilar airspace disease. Minor atelectasis at the left lung base. No other left lung consolidation. No pneumothorax. Stable heart size and mediastinal contours. IMPRESSION: 1. Moderate-sized right pleural effusion is minimally improved from prior exam. 2. Slight improvement in right perihilar airspace disease. 3. Minor left basilar atelectasis. Electronically Signed   By: Keith Rake M.D.   On: 01/22/2022 18:15        Scheduled Meds:  arformoterol  15 mcg Nebulization BID   budesonide (PULMICORT) nebulizer solution  0.25 mg Nebulization BID   feeding supplement  237 mL Oral BID BM   pantoprazole (PROTONIX) IV  40 mg Intravenous Q24H   revefenacin  175 mcg Nebulization Daily   rivaroxaban  20 mg Oral Q supper   Continuous Infusions:  sodium chloride Stopped (01/12/22 1757)   sodium chloride 250 mL (01/26/2022 2030)   sodium chloride 100 mL/hr at 01/23/22 0929   ceFEPime (MAXIPIME) IV 2 g (01/23/22 0923)   metronidazole 500 mg (01/23/22 0931)   [START ON 02/03/2022]  vancomycin      LOS: 22 days     Alma Friendly, MD [facility]  If 7PM-7AM, please contact night-coverage [email or web address]  01/23/2022, 4:46 PM

## 2022-01-23 NOTE — Progress Notes (Signed)
Pharmacy Antibiotic Note  [name] is a 81 y.o. female admitted on 12/10/2021 with sepsis. Admitted on 12/31/2021, s/p PEA arrest 2/2 to respiratory failure, s/p antibiotics for CAP and septic shock 2/2 ischemic colitis with perforation, s/p repair. Now febrile at 101F, WBCs 21K and hypotensive. Pharmacy has been consulted for vancomycin and cefepime dosing. Renal function is stable.   Plan: Vancomycin IV 1250 mg x 1, then Vancomycin 1500 mg IV q48h (eAUC 440.7, Scr 0.88)  Cefepime 2g IV q12h  Metronidazole 500 mg IV q12h  Monitor renal function, clinical s/sx infection, culture results  Obtain vancomycin levels at steady state   Height: 5' (152.4 cm) Weight: 66 kg (145 lb 8.1 oz) IBW/kg (Calculated) : 45.5  Temp (24hrs), Avg:99.6 F (37.6 C), Min:98.5 F (36.9 C), Max:101 F (38.3 C)  Recent Labs  Lab 01/19/2022 0538 01/07/2022 2255 01/19/22 0645 01/20/22 0320 01/21/22 0513 01/22/22 0513 01/23/22 0530  WBC 11.4*  --  11.0* 14.0* 9.5  --  21.0*  CREATININE 0.50  0.48  --  0.62 0.55 0.72 0.70 0.88  LATICACIDVEN  --  1.3  --   --   --   --   --     Estimated Creatinine Clearance: 44.7 mL/min (by C-G formula based on SCr of 0.88 mg/dL).    Allergies  Allergen Reactions   Chlorhexidine Gluconate Hives, Swelling and Rash       "ChloraPrep "   Doxycycline Nausea And Vomiting   Codeine Nausea Only and Other (See Comments)    Pt can tolerate when pre-medicated   Doxycycline Nausea And Vomiting   Nsaids     avoid due to gerd   Nsaids Other (See Comments)    Gi bleed   Tylenol [Acetaminophen]     Avoid due to GERD   Adhesive [Tape] Rash    Dermabond   Chlorhexidine Swelling and Rash   Codeine Nausea Only   Tylenol [Acetaminophen] Other (See Comments)    GI bleed    Antimicrobials this admission: Vanc 7/30>> 8/1; 8/19 >>  Cefepime 7/31>> 8/3; 8/19 >>  Metronidazole 8/19 >>  Azithromycin 7/28 >> 8/2 Ceftriaxone 7/28>> 7/29 Zosyn 8/6> 8/14 Micafungin  8/7>8/11  Dose adjustments this admission:   Microbiology results: 8/18 Bcx - ngtd Pleural fluid 7/31: NGF Bcx 7/28: NGF BAL 7/30: NGF Ucx 7/29: NGF MRSA PCR 7/28: neg   Thank you for allowing pharmacy to be a part of this patient's care.  Eliseo Gum, PharmD PGY1 Pharmacy Resident   01/23/2022  8:59 AM

## 2022-01-24 ENCOUNTER — Inpatient Hospital Stay (HOSPITAL_COMMUNITY): Payer: Medicare Other

## 2022-01-24 DIAGNOSIS — J9601 Acute respiratory failure with hypoxia: Secondary | ICD-10-CM | POA: Diagnosis not present

## 2022-01-24 DIAGNOSIS — J189 Pneumonia, unspecified organism: Secondary | ICD-10-CM | POA: Diagnosis not present

## 2022-01-24 DIAGNOSIS — I4811 Longstanding persistent atrial fibrillation: Secondary | ICD-10-CM | POA: Diagnosis not present

## 2022-01-24 DIAGNOSIS — I469 Cardiac arrest, cause unspecified: Secondary | ICD-10-CM | POA: Diagnosis not present

## 2022-01-24 LAB — CBC WITH DIFFERENTIAL/PLATELET
Abs Immature Granulocytes: 0.18 10*3/uL — ABNORMAL HIGH (ref 0.00–0.07)
Basophils Absolute: 0 10*3/uL (ref 0.0–0.1)
Basophils Relative: 0 %
Eosinophils Absolute: 0.3 10*3/uL (ref 0.0–0.5)
Eosinophils Relative: 2 %
HCT: 25 % — ABNORMAL LOW (ref 36.0–46.0)
Hemoglobin: 7.8 g/dL — ABNORMAL LOW (ref 12.0–15.0)
Immature Granulocytes: 1 %
Lymphocytes Relative: 6 %
Lymphs Abs: 1.1 10*3/uL (ref 0.7–4.0)
MCH: 34.5 pg — ABNORMAL HIGH (ref 26.0–34.0)
MCHC: 31.2 g/dL (ref 30.0–36.0)
MCV: 110.6 fL — ABNORMAL HIGH (ref 80.0–100.0)
Monocytes Absolute: 1.2 10*3/uL — ABNORMAL HIGH (ref 0.1–1.0)
Monocytes Relative: 7 %
Neutro Abs: 14.7 10*3/uL — ABNORMAL HIGH (ref 1.7–7.7)
Neutrophils Relative %: 84 %
Platelets: 328 10*3/uL (ref 150–400)
RBC: 2.26 MIL/uL — ABNORMAL LOW (ref 3.87–5.11)
RDW: 16.4 % — ABNORMAL HIGH (ref 11.5–15.5)
WBC: 17.5 10*3/uL — ABNORMAL HIGH (ref 4.0–10.5)
nRBC: 0 % (ref 0.0–0.2)

## 2022-01-24 LAB — COMPREHENSIVE METABOLIC PANEL
ALT: 25 U/L (ref 0–44)
AST: 23 U/L (ref 15–41)
Albumin: 1.8 g/dL — ABNORMAL LOW (ref 3.5–5.0)
Alkaline Phosphatase: 64 U/L (ref 38–126)
Anion gap: 7 (ref 5–15)
BUN: 26 mg/dL — ABNORMAL HIGH (ref 8–23)
CO2: 26 mmol/L (ref 22–32)
Calcium: 8.2 mg/dL — ABNORMAL LOW (ref 8.9–10.3)
Chloride: 101 mmol/L (ref 98–111)
Creatinine, Ser: 1 mg/dL (ref 0.44–1.00)
GFR, Estimated: 58 mL/min — ABNORMAL LOW (ref >60)
Glucose, Bld: 105 mg/dL — ABNORMAL HIGH (ref 70–99)
Potassium: 3.4 mmol/L — ABNORMAL LOW (ref 3.5–5.1)
Sodium: 134 mmol/L — ABNORMAL LOW (ref 135–145)
Total Bilirubin: 1.2 mg/dL (ref 0.3–1.2)
Total Protein: 4.9 g/dL — ABNORMAL LOW (ref 6.5–8.1)

## 2022-01-24 LAB — CBC
HCT: 19.7 % — ABNORMAL LOW (ref 36.0–46.0)
Hemoglobin: 6.3 g/dL — CL (ref 12.0–15.0)
MCH: 35.2 pg — ABNORMAL HIGH (ref 26.0–34.0)
MCHC: 32 g/dL (ref 30.0–36.0)
MCV: 110.1 fL — ABNORMAL HIGH (ref 80.0–100.0)
Platelets: 297 10*3/uL (ref 150–400)
RBC: 1.79 MIL/uL — ABNORMAL LOW (ref 3.87–5.11)
RDW: 16.7 % — ABNORMAL HIGH (ref 11.5–15.5)
WBC: 14.9 10*3/uL — ABNORMAL HIGH (ref 4.0–10.5)
nRBC: 0 % (ref 0.0–0.2)

## 2022-01-24 LAB — PROCALCITONIN: Procalcitonin: 0.85 ng/mL

## 2022-01-24 LAB — GLUCOSE, CAPILLARY: Glucose-Capillary: 76 mg/dL (ref 70–99)

## 2022-01-24 MED ORDER — IOHEXOL 300 MG/ML  SOLN
100.0000 mL | Freq: Once | INTRAMUSCULAR | Status: AC | PRN
  Administered 2022-01-24: 100 mL via INTRAVENOUS

## 2022-01-24 MED ORDER — POTASSIUM CHLORIDE CRYS ER 20 MEQ PO TBCR
40.0000 meq | EXTENDED_RELEASE_TABLET | Freq: Once | ORAL | Status: AC
  Administered 2022-01-24: 40 meq via ORAL
  Filled 2022-01-24: qty 2

## 2022-01-24 MED ORDER — RIVAROXABAN 15 MG PO TABS
15.0000 mg | ORAL_TABLET | Freq: Every day | ORAL | Status: DC
  Administered 2022-01-24: 15 mg via ORAL
  Filled 2022-01-24: qty 1

## 2022-01-24 NOTE — Progress Notes (Signed)
PROGRESS NOTE    [name]  IDP:[id number] DOB: 1941/03/25 DOA: 12/27/2021 PCP: [facility] [place]   Brief Narrative:  81 year old female admitted for community-acquired pneumonia and respiratory failure and concurrent cardiac arrest.  Patient had a parapneumonic effusion requiring chest tube.  Developed ischemic colitis was taken to the OR for a laparotomy with worsening shock/confusion. Patient was extubated on 01/14/2022.  7/28 admit, Ceftriaxone/Azithro 7/29 last dose xeralto  7/30 IHCA x 7 min, Ceftriaxone>Cefepime 7/31 Chest tube placed, heparin gtt started 8/1 patient remove chest tube.  Head CT obtained >negative.  Severely agitated overnight.  Vancomycin stopped.  Azithromycin stopped. 8/6 Transferred to [facility] 8/7 Called CCM back for abd, Distention , hypotension and a fib with RVR, plan is for urgent OR 8/10 Abdomen closed yesterday, SBT/SAT, extubated 8/13 small BM/flatus 8/14 responded well to diuresis. Unable to complete thora as no evidence of pleural fluid but significant lung trapping noted per [facility] 8/15 overnight requiring BIPAP, improving and progressing.    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   Hypotension   A-fib (HCC)   Lactic acid increased   Cardiac arrest ([place])   Acute respiratory failure with hypoxia (HCC)   Pneumonia of right lower lobe due to infectious organism   Takotsubo cardiomyopathy   Ischemic colitis ([place])   Septic shock ([place])   Septic shock Ischemic colitis with perforation Concurrent CAP with parapneumonic effusion Questionable concurrent cardiogenic shock Shock resolved, off pressors Blood cultures negative so far Known history of Takotsubo cardiomyopathy -EF 25 to 30%, now resolved Status 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 Abscess Now with marked leukocytosis Chest x-ray with resolving pleural effusion, no new infiltrate opacity UA with large leukocytes, WBC 21-50 Urine culture with 10,000 E faecalis BC x2 NGTD CT abdomen/pelvis with right sided fluid collection, possible anastomotic leak versus abscess versus postop change Continue gentle IV fluid  Continue vancomycin, cefepime, Flagyl  Acute hypoxic respiratory failure due to right sided CAP with parapneumonic effusion, COPD without acute exacerbation  Extubated 01/14/2022  CXR shows R pleural effusion, PCCM called to assist given recent respiratory failure and high risk for respiratory compromise, no further work up Continue PT OT mobilization, flutter, incentive spirometry Neb triple therapy COPD not in acute exacerbation, no indication for steroids at this time    SP PEA arrest 2/2 respiratory distress/ hypoxia Atrial fibrillation with RVR 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 EF 25-30% on 7/31 [place] Held diuretics, bisoprolol, ARB Switched to Xarelto Cardiomyopathy resolving, she has systolic recovery, EF 44%, without evidence of wall motion abnormalities  Moderate protein caloric malnutrition  Hypokalemia Hypomagnesemia Possible re-feeding syndrome Advance diet as tolerated  Macrocytic anemia ABLA post op, multifactorial Hgb trending downwards No obvious signs of bleeding Daily CBC, transfuse if hemoglobin less than 7  Left arm swelling/redness DVT Doppler negative       DVT prophylaxis: Xarelto Code Status: Full Family Communication: Husband at bedside  Status is: Inpatient  Dispo: The patient is from: Home              Anticipated d/c is to: To be determined  Anticipated d/c date is: TBD              Patient currently not  medically stable for discharge  Consultants:  General surgery, PCCM, cardiology  Antimicrobials:  Zosyn completed  01/08/2022 Micafungin discontinued   Subjective: Today, patient subjectively short of breath likely 2/2 aggressive IV fluid resuscitation, reports right-sided abdominal pain, denies any nausea/vomiting, no further fever/chills.  Appears lethargic   Objective: Vitals:   01/24/22 0350 01/24/22 0745 01/24/22 1340 01/24/22 1431  BP: (!) 89/40 (!) 97/47 107/61   Pulse: 78 79 89   Resp: '17 18 20   '$ Temp: 98 F (36.7 C) 98.9 F (37.2 C) 98.3 F (36.8 C)   TempSrc: Oral Oral Oral   SpO2: 100% 100% 100% 100%  Weight: 66.1 kg     Height:        Intake/Output Summary (Last 24 hours) at 01/24/2022 1551 Last data filed at 01/24/2022 1343 Gross per 24 hour  Intake 2693.58 ml  Output 600 ml  Net 2093.58 ml   Filed Weights   01/22/22 0542 01/23/22 0505 01/24/22 0350  Weight: 69.3 kg 66 kg 66.1 kg    Examination: General: NAD, lethargic Cardiovascular: S1, S2 present Respiratory: Diminished breath sounds bilaterally Abdomen: Soft, distended, large abdominal bandage, C/D/I Musculoskeletal: Trace bilateral pedal edema noted Skin: Normal Psychiatry: Normal mood    Data Reviewed: I have personally reviewed following labs and imaging studies  CBC: Recent Labs  Lab 01/14/2022 0538 01/19/22 0645 01/20/22 0320 01/21/22 0513 01/23/22 0530 01/24/22 0926 01/24/22 1040  WBC 11.4* 11.0* 14.0* 9.5 21.0* 14.9* 17.5*  NEUTROABS 9.6* 8.8* 11.1* 7.1  --   --  14.7*  HGB 8.3* 8.6* 8.3* 7.6* 8.2* 6.3* 7.8*  HCT 25.5* 26.0* 26.0* 23.2* 24.9* 19.7* 25.0*  MCV 103.7* 104.0* 106.6* 104.0* 105.5* 110.1* 110.6*  PLT 245 240 292 294 401* 297 448   Basic Metabolic Panel: Recent Labs  Lab 01/24/2022 0538 01/19/22 0645 01/20/22 0320 01/21/22 0513 01/22/22 0513 01/23/22 0530 01/24/22 0432  NA 138  138 141 139 139 138 141 134*  K 3.9  3.8 3.7 3.5 3.0* 3.8 4.6 3.4*  CL 100   99 100 97* 97* 98 99 101  CO2 35*  32 35* 33* 34* 35* 34* 26  GLUCOSE 130*  133* 102* 90 93 106* 118* 105*  BUN 23  22 26* '21 19 17 20 '$ 26*  CREATININE 0.50  0.48 0.62 0.55 0.72 0.70 0.88 1.00  CALCIUM 9.0  9.1 9.2 9.7 9.5 9.3 9.5 8.2*  MG 1.8 1.8 1.8  --  1.5* 1.7  --   PHOS 3.3  3.2  --  2.7  --   --   --   --    GFR: Estimated Creatinine Clearance: 39.3 mL/min (by C-G formula based on SCr of 1 mg/dL). Liver Function Tests: Recent Labs  Lab 01/29/2022 0538 01/24/22 0432  AST 33 23  ALT 23 25  ALKPHOS 86 64  BILITOT 1.1 1.2  PROT 5.6* 4.9*  ALBUMIN 2.6*  2.6* 1.8*   No results for input(s): "LIPASE", "AMYLASE" in the last 168 hours. No results for input(s): "AMMONIA" in the last 168 hours. Coagulation Profile: No results for input(s): "INR", "PROTIME" in the last 168 hours. Cardiac Enzymes: No results for input(s): "CKTOTAL", "CKMB", "CKMBINDEX", "TROPONINI" in the last 168 hours. BNP (last 3 results) No results for input(s): "PROBNP" in the last 8760 hours. HbA1C: No results for input(s): "HGBA1C" in the last 72 hours. CBG:  Recent Labs  Lab 01/17/22 2317 01/24/22 0744  GLUCAP 120* 76   Lipid Profile: No results for input(s): "CHOL", "HDL", "LDLCALC", "TRIG", "CHOLHDL", "LDLDIRECT" in the last 72 hours.  Thyroid Function Tests: No results for input(s): "TSH", "T4TOTAL", "FREET4", "T3FREE", "THYROIDAB" in the last 72 hours. Anemia Panel: No results for input(s): "VITAMINB12", "FOLATE", "FERRITIN", "TIBC", "IRON", "RETICCTPCT" in the last 72 hours. Sepsis Labs: Recent Labs  Lab 01/12/2022 2255 01/23/22 1658 01/23/22 1730 01/24/22 0432  PROCALCITON  --   --  0.74 0.85  LATICACIDVEN 1.3 1.1  --   --     Recent Results (from the past 240 hour(s))  Culture, blood (Routine X 2) w Reflex to ID Panel     Status: None (Preliminary result)   Collection Time: 01/22/22  9:14 PM   Specimen: BLOOD LEFT HAND  Result Value Ref Range Status   Specimen Description BLOOD  LEFT HAND  Final   Special Requests   Final    BOTTLES DRAWN AEROBIC ONLY Blood Culture results may not be optimal due to an inadequate volume of blood received in culture bottles   Culture   Final    NO GROWTH 2 DAYS Performed at [facility], [place] [address], [place], [place] [address]    Report Status PENDING  Incomplete  Culture, blood (Routine X 2) w Reflex to ID Panel     Status: None (Preliminary result)   Collection Time: 01/22/22  9:21 PM   Specimen: BLOOD RIGHT HAND  Result Value Ref Range Status   Specimen Description BLOOD RIGHT HAND  Final   Special Requests   Final    BOTTLES DRAWN AEROBIC ONLY Blood Culture results may not be optimal due to an inadequate volume of blood received in culture bottles   Culture   Final    NO GROWTH 2 DAYS Performed at [facility], [place] [address] [place], [place] [address]    Report Status PENDING  Incomplete  Urine Culture     Status: Abnormal (Preliminary result)   Collection Time: 01/23/22 11:35 AM   Specimen: Urine, Clean Catch  Result Value Ref Range Status   Specimen Description URINE, CLEAN CATCH  Final   Special Requests   Final    NONE Performed at [facility], [place] [address], [place], [place] [address]    Culture 10,000 COLONIES/mL ENTEROCOCCUS FAECALIS (A)  Final   Report Status PENDING  Incomplete         Radiology Studies: CT ABDOMEN PELVIS W CONTRAST  Result Date: 01/24/2022 CLINICAL DATA:  Abdominal pain, acute, nonlocalized EXAM: CT ABDOMEN AND PELVIS WITH CONTRAST TECHNIQUE: Multidetector CT imaging of the abdomen and pelvis was performed using the standard protocol following bolus administration of intravenous contrast. RADIATION DOSE REDUCTION: This exam was performed according to the departmental dose-optimization program which includes automated exposure control, adjustment of the mA and/or kV according to patient size and/or use of iterative reconstruction technique. CONTRAST:  129m  OMNIPAQUE IOHEXOL 300 MG/ML  SOLN COMPARISON:  01/10/2022 FINDINGS: Lower chest: Focal consolidation within the basilar right middle lobe is again seen, not fully assessed on this examination. Moderate right pleural effusion appears slightly enlarged since prior examination with compressive atelectasis of the right lower lobe. Cardiac size is mildly enlarged. Central venous catheter tip noted within the right atrium. Bilateral breast implants are noted. Surgical changes of right mastectomy are suspected. Hepatobiliary: No focal liver abnormality is seen. No gallstones, gallbladder wall thickening, or biliary dilatation.  Pancreas: Unremarkable Spleen: Unremarkable Adrenals/Urinary Tract: The adrenal glands are unremarkable. The kidneys are normal. The bladder is largely decompressed. Stomach/Bowel: Mild descending and sigmoid colonic diverticulosis without superimposed acute inflammatory change. Surgical changes of right hemicolectomy are identified. A extraluminal collection of gas and fluid is seen within the right mid abdomen anteriorly measuring 2.9 x 5.9 x 8.1 cm in greatest dimension, possibly representing an anastomotic leak given its close association with the anastomotic staple line, postoperative fluid collection, or abscess. There is extensive infiltrative change within the mesentery of the right mid abdomen, possibly postsurgical in nature or inflammatory given the adjacent fluid collection. Mild ascites is present. Stomach is unremarkable. Mild bowel wall thickening involving several loops of distal small bowel within the pelvis, again possibly postsurgical in nature. No evidence of obstruction. No free intraperitoneal gas. Vascular/Lymphatic: Aortic atherosclerosis. No enlarged abdominal or pelvic lymph nodes. Reproductive: Uterus and bilateral adnexa are unremarkable. Other: Mild diffuse subcutaneous body wall edema in keeping with anasarca. Superficial dehiscence of the midline laparotomy wound. The  abdominal fascia appears intact. Musculoskeletal: Degenerative changes seen within the lumbar spine. No lytic or blastic bone lesion. IMPRESSION: 1. Surgical changes of right hemicolectomy. Extraluminal collection of gas and fluid within the right mid abdomen anteriorly measuring 2.9 x 5.9 x 8.1 cm in greatest dimension, possibly representing an anastomotic leak given its close association with the anastomotic staple line, postoperative fluid collection, or abscess. 2. Extensive infiltrative change within the mesentery of the right mid abdomen, possibly postsurgical in nature or inflammatory given the adjacent fluid collection. 3. Mild bowel wall thickening involving several loops of distal small bowel within the pelvis, again possibly postsurgical in nature. No evidence of obstruction. 4. Slight interval enlargement of right pleural effusion, progressive body wall subcutaneous edema, and development of mild ascites in keeping with progressive anasarca. 5. Focal consolidation within the basilar right middle lobe again noted, not fully assessed on this examination. Aortic Atherosclerosis (ICD10-I70.0). Electronically Signed   By: Fidela Salisbury M.D.   On: 01/24/2022 01:33   DG Chest Port 1 View  Result Date: 01/22/2022 CLINICAL DATA:  Fever.  Respiratory distress. EXAM: PORTABLE CHEST 1 VIEW COMPARISON:  Radiograph 01/19/2022.  CT 01/03/2022 FINDINGS: Left upper extremity PICC tip unchanged. Moderate-sized right pleural effusion is minimally improved from prior exam. There may be slight improvement in right perihilar airspace disease. Minor atelectasis at the left lung base. No other left lung consolidation. No pneumothorax. Stable heart size and mediastinal contours. IMPRESSION: 1. Moderate-sized right pleural effusion is minimally improved from prior exam. 2. Slight improvement in right perihilar airspace disease. 3. Minor left basilar atelectasis. Electronically Signed   By: Keith Rake M.D.   On:  01/22/2022 18:15        Scheduled Meds:  arformoterol  15 mcg Nebulization BID   budesonide (PULMICORT) nebulizer solution  0.25 mg Nebulization BID   feeding supplement  237 mL Oral BID BM   pantoprazole (PROTONIX) IV  40 mg Intravenous Q24H   revefenacin  175 mcg Nebulization Daily   rivaroxaban  15 mg Oral Q supper   Continuous Infusions:  sodium chloride Stopped (01/12/22 1757)   sodium chloride 250 mL (01/14/2022 2030)   sodium chloride 125 mL/hr at 01/23/22 1732   ceFEPime (MAXIPIME) IV 2 g (01/24/22 0909)   metronidazole 500 mg (01/24/22 0910)   [START ON 01/12/2022] vancomycin      LOS: 23 days     Alma Friendly, MD [facility]  If 7PM-7AM, please  contact night-coverage [email or web address]  01/24/2022, 3:51 PM

## 2022-01-24 NOTE — Progress Notes (Signed)
Assessment & Plan: POD 13/11 S/p exploratory laparotomy,  R colectomy, abthera placement 01/19/2022 Dr. Rosendo Gros S/p exploratory laparotomy, intestinal anastomosis, abdominal closure 01/16/2022 Dr. Rosendo Gros  for ischemic bowel with perforation - afebrile this AM, apparently with fever last PM - WBC remains elevated at 17.5 - CT scan with right sided fluid collection, air - ?anastomotic leak vs abscess vs post op change - will go to clear liquids today and NPO after MN - possible IR intervention on 8/21 - wet-to-dry dressing to midline BID  - mobilize/pulm toilet - therapies recommend CIR   FEN: clear liquid today ID: Zosyn (stop date 8/14) VTE: SCD's, Xarelto Foley: [email or web address] Dispo: progressive   Below per [facility] -- Septic shock due to above Cardiac arrest 7/30, 7 mins CPR Hx cardiomyopathy COPD Afib, permanent CAP        Armandina Gemma, MD [facility] [place] [facility] Office: [phone]        Chief Complaint: Status post right colectomy for ischemia  Subjective: Patient in bed, family at bedside.  Mild right sided abdominal pain.  Loose BM's.  Objective: Vital signs in last 24 hours: Temp:  [98 F (36.7 C)-99 F (37.2 C)] 98.9 F (37.2 C) (08/20 0745) Pulse Rate:  [78-97] 79 (08/20 0745) Resp:  [17-20] 18 (08/20 0745) BP: (85-112)/(40-57) 97/47 (08/20 0745) SpO2:  [90 %-100 %] 100 % (08/20 0745) Weight:  [66.1 kg] 66.1 kg (08/20 0350) Last BM Date : 01/22/22  Intake/Output from previous day: 08/19 0701 - 08/20 0700 In: 2453.6 [I.V.:1199.4; IV Piggyback:1254.2] Out: 250 [Urine:250] Intake/Output this shift: Total I/O In: -  Out: 100 [Urine:100]  Physical Exam: HEENT - sclerae clear, mucous membranes moist Neck - soft Abdomen - soft, protuberant; BS present; mild tenderness right mid abdomen and RLQ; no mass; dressings dry and intact Neuro - alert & oriented, no focal deficits  Lab Results:  Recent Labs    01/24/22 0926 01/24/22 1040  WBC  14.9* 17.5*  HGB 6.3* 7.8*  HCT 19.7* 25.0*  PLT 297 328   BMET Recent Labs    01/23/22 0530 01/24/22 0432  NA 141 134*  K 4.6 3.4*  CL 99 101  CO2 34* 26  GLUCOSE 118* 105*  BUN 20 26*  CREATININE 0.88 1.00  CALCIUM 9.5 8.2*   PT/INR No results for input(s): "LABPROT", "INR" in the last 72 hours. Comprehensive Metabolic Panel:    Component Value Date/Time   NA 134 (L) 01/24/2022 0432   NA 141 01/23/2022 0530   NA 139 11/18/2021 1432   NA 136 05/04/2021 1109   NA 134 (L) 04/25/2017 1216   NA 139 11/24/2016 0842   K 3.4 (L) 01/24/2022 0432   K 4.6 01/23/2022 0530   K 4.3 04/25/2017 1216   K 3.8 11/24/2016 0842   CL 101 01/24/2022 0432   CL 99 01/23/2022 0530   CO2 26 01/24/2022 0432   CO2 34 (H) 01/23/2022 0530   CO2 29 04/25/2017 1216   CO2 26 11/24/2016 0842   BUN 26 (H) 01/24/2022 0432   BUN 20 01/23/2022 0530   BUN 10 11/18/2021 1432   BUN 10 05/04/2021 1109   BUN 14.4 04/25/2017 1216   BUN 15.4 11/24/2016 0842   CREATININE 1.00 01/24/2022 0432   CREATININE 0.88 01/23/2022 0530   CREATININE 0.72 01/17/2018 1347   CREATININE 0.79 07/25/2017 1408   CREATININE 0.7 04/25/2017 1216   CREATININE 0.8 11/24/2016 0842   GLUCOSE 105 (H) 01/24/2022 0432  GLUCOSE 118 (H) 01/23/2022 0530   GLUCOSE 85 04/25/2017 1216   GLUCOSE 104 11/24/2016 0842   CALCIUM 8.2 (L) 01/24/2022 0432   CALCIUM 9.5 01/23/2022 0530   CALCIUM 10.0 04/25/2017 1216   CALCIUM 9.8 11/24/2016 0842   AST 23 01/24/2022 0432   AST 33 01/20/2022 0538   AST 20 01/17/2018 1347   AST 21 07/25/2017 1408   AST 21 04/25/2017 1216   AST 22 11/24/2016 0842   ALT 25 01/24/2022 0432   ALT 23 01/26/2022 0538   ALT 10 01/17/2018 1347   ALT 7 07/25/2017 1408   ALT 10 04/25/2017 1216   ALT 14 11/24/2016 0842   ALKPHOS 64 01/24/2022 0432   ALKPHOS 86 01/06/2022 0538   ALKPHOS 75 04/25/2017 1216   ALKPHOS 66 11/24/2016 0842   BILITOT 1.2 01/24/2022 0432   BILITOT 1.1 02/02/2022 0538   BILITOT 0.5  01/17/2018 1347   BILITOT 0.6 07/25/2017 1408   BILITOT 0.65 04/25/2017 1216   BILITOT 0.96 11/24/2016 0842   PROT 4.9 (L) 01/24/2022 0432   PROT 5.6 (L) 01/14/2022 0538   PROT 7.2 04/25/2017 1216   PROT 6.8 11/24/2016 0842   ALBUMIN 1.8 (L) 01/24/2022 0432   ALBUMIN 2.6 (L) 02/01/2022 0538   ALBUMIN 2.6 (L) 02/02/2022 0538   ALBUMIN 3.7 04/25/2017 1216   ALBUMIN 3.7 11/24/2016 0842    Studies/Results: CT ABDOMEN PELVIS W CONTRAST  Result Date: 01/24/2022 CLINICAL DATA:  Abdominal pain, acute, nonlocalized EXAM: CT ABDOMEN AND PELVIS WITH CONTRAST TECHNIQUE: Multidetector CT imaging of the abdomen and pelvis was performed using the standard protocol following bolus administration of intravenous contrast. RADIATION DOSE REDUCTION: This exam was performed according to the departmental dose-optimization program which includes automated exposure control, adjustment of the mA and/or kV according to patient size and/or use of iterative reconstruction technique. CONTRAST:  159m OMNIPAQUE IOHEXOL 300 MG/ML  SOLN COMPARISON:  01/10/2022 FINDINGS: Lower chest: Focal consolidation within the basilar right middle lobe is again seen, not fully assessed on this examination. Moderate right pleural effusion appears slightly enlarged since prior examination with compressive atelectasis of the right lower lobe. Cardiac size is mildly enlarged. Central venous catheter tip noted within the right atrium. Bilateral breast implants are noted. Surgical changes of right mastectomy are suspected. Hepatobiliary: No focal liver abnormality is seen. No gallstones, gallbladder wall thickening, or biliary dilatation. Pancreas: Unremarkable Spleen: Unremarkable Adrenals/Urinary Tract: The adrenal glands are unremarkable. The kidneys are normal. The bladder is largely decompressed. Stomach/Bowel: Mild descending and sigmoid colonic diverticulosis without superimposed acute inflammatory change. Surgical changes of right  hemicolectomy are identified. A extraluminal collection of gas and fluid is seen within the right mid abdomen anteriorly measuring 2.9 x 5.9 x 8.1 cm in greatest dimension, possibly representing an anastomotic leak given its close association with the anastomotic staple line, postoperative fluid collection, or abscess. There is extensive infiltrative change within the mesentery of the right mid abdomen, possibly postsurgical in nature or inflammatory given the adjacent fluid collection. Mild ascites is present. Stomach is unremarkable. Mild bowel wall thickening involving several loops of distal small bowel within the pelvis, again possibly postsurgical in nature. No evidence of obstruction. No free intraperitoneal gas. Vascular/Lymphatic: Aortic atherosclerosis. No enlarged abdominal or pelvic lymph nodes. Reproductive: Uterus and bilateral adnexa are unremarkable. Other: Mild diffuse subcutaneous body wall edema in keeping with anasarca. Superficial dehiscence of the midline laparotomy wound. The abdominal fascia appears intact. Musculoskeletal: Degenerative changes seen within the lumbar spine. No lytic or  blastic bone lesion. IMPRESSION: 1. Surgical changes of right hemicolectomy. Extraluminal collection of gas and fluid within the right mid abdomen anteriorly measuring 2.9 x 5.9 x 8.1 cm in greatest dimension, possibly representing an anastomotic leak given its close association with the anastomotic staple line, postoperative fluid collection, or abscess. 2. Extensive infiltrative change within the mesentery of the right mid abdomen, possibly postsurgical in nature or inflammatory given the adjacent fluid collection. 3. Mild bowel wall thickening involving several loops of distal small bowel within the pelvis, again possibly postsurgical in nature. No evidence of obstruction. 4. Slight interval enlargement of right pleural effusion, progressive body wall subcutaneous edema, and development of mild ascites in  keeping with progressive anasarca. 5. Focal consolidation within the basilar right middle lobe again noted, not fully assessed on this examination. Aortic Atherosclerosis (ICD10-I70.0). Electronically Signed   By: Fidela Salisbury M.D.   On: 01/24/2022 01:33   DG Chest Port 1 View  Result Date: 01/22/2022 CLINICAL DATA:  Fever.  Respiratory distress. EXAM: PORTABLE CHEST 1 VIEW COMPARISON:  Radiograph 01/19/2022.  CT 01/03/2022 FINDINGS: Left upper extremity PICC tip unchanged. Moderate-sized right pleural effusion is minimally improved from prior exam. There may be slight improvement in right perihilar airspace disease. Minor atelectasis at the left lung base. No other left lung consolidation. No pneumothorax. Stable heart size and mediastinal contours. IMPRESSION: 1. Moderate-sized right pleural effusion is minimally improved from prior exam. 2. Slight improvement in right perihilar airspace disease. 3. Minor left basilar atelectasis. Electronically Signed   By: Keith Rake M.D.   On: 01/22/2022 18:15      Armandina Gemma 01/24/2022   Patient ID: [name], female   DOB: 1941-03-30, 81 y.o.   MRN: [id number]

## 2022-01-24 NOTE — Progress Notes (Signed)
Wound drsg changed per order.   [name] M

## 2022-01-24 NOTE — Progress Notes (Signed)
   01/24/22 1056  Provider Notification  Provider Name/Title Dr. Horris Latino  Date Provider Notified 01/24/22  Time Provider Notified [id number]  Method of Notification Face-to-face  Notification Reason Critical result  Test performed and critical result Hgb 6.3  Date Critical Result Received 01/24/22  Time Critical Result Received 1022  Provider response See new orders (repeat CBC)  Date of Provider Response 01/24/22  Time of Provider Response 1025

## 2022-01-24 NOTE — Progress Notes (Signed)
Repeat Hgb 7.8, MD on call paged with result.    [name] M

## 2022-01-25 ENCOUNTER — Inpatient Hospital Stay (HOSPITAL_COMMUNITY): Payer: Medicare Other

## 2022-01-25 ENCOUNTER — Encounter (HOSPITAL_COMMUNITY): Payer: Self-pay | Admitting: Internal Medicine

## 2022-01-25 ENCOUNTER — Encounter (HOSPITAL_COMMUNITY): Admission: EM | Disposition: E | Payer: Self-pay | Source: Home / Self Care | Attending: Internal Medicine

## 2022-01-25 DIAGNOSIS — I4811 Longstanding persistent atrial fibrillation: Secondary | ICD-10-CM | POA: Diagnosis not present

## 2022-01-25 DIAGNOSIS — L8990 Pressure ulcer of unspecified site, unspecified stage: Secondary | ICD-10-CM | POA: Insufficient documentation

## 2022-01-25 DIAGNOSIS — I469 Cardiac arrest, cause unspecified: Secondary | ICD-10-CM | POA: Diagnosis not present

## 2022-01-25 DIAGNOSIS — J9601 Acute respiratory failure with hypoxia: Secondary | ICD-10-CM | POA: Diagnosis not present

## 2022-01-25 DIAGNOSIS — J189 Pneumonia, unspecified organism: Secondary | ICD-10-CM | POA: Diagnosis not present

## 2022-01-25 LAB — BLOOD GAS, ARTERIAL
Acid-base deficit: 3.8 mmol/L — ABNORMAL HIGH (ref 0.0–2.0)
Bicarbonate: 24.4 mmol/L (ref 20.0–28.0)
Drawn by: 270221
O2 Saturation: 99.8 %
Patient temperature: 38
pCO2 arterial: 60 mmHg — ABNORMAL HIGH (ref 32–48)
pH, Arterial: 7.23 — ABNORMAL LOW (ref 7.35–7.45)
pO2, Arterial: 168 mmHg — ABNORMAL HIGH (ref 83–108)

## 2022-01-25 LAB — COMPREHENSIVE METABOLIC PANEL
ALT: 27 U/L (ref 0–44)
AST: 22 U/L (ref 15–41)
Albumin: 2.1 g/dL — ABNORMAL LOW (ref 3.5–5.0)
Alkaline Phosphatase: 66 U/L (ref 38–126)
Anion gap: 6 (ref 5–15)
BUN: 29 mg/dL — ABNORMAL HIGH (ref 8–23)
CO2: 23 mmol/L (ref 22–32)
Calcium: 9 mg/dL (ref 8.9–10.3)
Chloride: 110 mmol/L (ref 98–111)
Creatinine, Ser: 1.2 mg/dL — ABNORMAL HIGH (ref 0.44–1.00)
GFR, Estimated: 46 mL/min — ABNORMAL LOW (ref >60)
Glucose, Bld: 75 mg/dL (ref 70–99)
Potassium: 4.9 mmol/L (ref 3.5–5.1)
Sodium: 139 mmol/L (ref 135–145)
Total Bilirubin: 1.2 mg/dL (ref 0.3–1.2)
Total Protein: 5.8 g/dL — ABNORMAL LOW (ref 6.5–8.1)

## 2022-01-25 LAB — LACTIC ACID, PLASMA
Lactic Acid, Venous: 0.7 mmol/L (ref 0.5–1.9)
Lactic Acid, Venous: 0.8 mmol/L (ref 0.5–1.9)

## 2022-01-25 LAB — CBC WITH DIFFERENTIAL/PLATELET
Abs Immature Granulocytes: 0.13 10*3/uL — ABNORMAL HIGH (ref 0.00–0.07)
Basophils Absolute: 0 10*3/uL (ref 0.0–0.1)
Basophils Relative: 0 %
Eosinophils Absolute: 0.3 10*3/uL (ref 0.0–0.5)
Eosinophils Relative: 3 %
HCT: 23.1 % — ABNORMAL LOW (ref 36.0–46.0)
Hemoglobin: 6.9 g/dL — CL (ref 12.0–15.0)
Immature Granulocytes: 1 %
Lymphocytes Relative: 9 %
Lymphs Abs: 0.8 10*3/uL (ref 0.7–4.0)
MCH: 34 pg (ref 26.0–34.0)
MCHC: 29.9 g/dL — ABNORMAL LOW (ref 30.0–36.0)
MCV: 113.8 fL — ABNORMAL HIGH (ref 80.0–100.0)
Monocytes Absolute: 0.9 10*3/uL (ref 0.1–1.0)
Monocytes Relative: 9 %
Neutro Abs: 7 10*3/uL (ref 1.7–7.7)
Neutrophils Relative %: 78 %
Platelets: 379 10*3/uL (ref 150–400)
RBC: 2.03 MIL/uL — ABNORMAL LOW (ref 3.87–5.11)
RDW: 16.2 % — ABNORMAL HIGH (ref 11.5–15.5)
WBC: 9.1 10*3/uL (ref 4.0–10.5)
nRBC: 0 % (ref 0.0–0.2)

## 2022-01-25 LAB — BASIC METABOLIC PANEL
Anion gap: 5 (ref 5–15)
BUN: 27 mg/dL — ABNORMAL HIGH (ref 8–23)
CO2: 27 mmol/L (ref 22–32)
Calcium: 8.9 mg/dL (ref 8.9–10.3)
Chloride: 109 mmol/L (ref 98–111)
Creatinine, Ser: 0.83 mg/dL (ref 0.44–1.00)
GFR, Estimated: >60 mL/min (ref >60)
Glucose, Bld: 86 mg/dL (ref 70–99)
Potassium: 4.6 mmol/L (ref 3.5–5.1)
Sodium: 141 mmol/L (ref 135–145)

## 2022-01-25 LAB — CBC
HCT: 22.8 % — ABNORMAL LOW (ref 36.0–46.0)
Hemoglobin: 7 g/dL — ABNORMAL LOW (ref 12.0–15.0)
MCH: 34.8 pg — ABNORMAL HIGH (ref 26.0–34.0)
MCHC: 30.7 g/dL (ref 30.0–36.0)
MCV: 113.4 fL — ABNORMAL HIGH (ref 80.0–100.0)
Platelets: 365 10*3/uL (ref 150–400)
RBC: 2.01 MIL/uL — ABNORMAL LOW (ref 3.87–5.11)
RDW: 16.2 % — ABNORMAL HIGH (ref 11.5–15.5)
WBC: 9.3 10*3/uL (ref 4.0–10.5)
nRBC: 0 % (ref 0.0–0.2)

## 2022-01-25 LAB — URINE CULTURE: Culture: 10000 — AB

## 2022-01-25 LAB — CORTISOL: Cortisol, Plasma: 17.5 ug/dL

## 2022-01-25 LAB — BRAIN NATRIURETIC PEPTIDE: B Natriuretic Peptide: 820 pg/mL — ABNORMAL HIGH (ref 0.0–100.0)

## 2022-01-25 LAB — PROCALCITONIN
Procalcitonin: 0.56 ng/mL
Procalcitonin: 0.47 ng/mL

## 2022-01-25 LAB — TROPONIN I (HIGH SENSITIVITY): Troponin I (High Sensitivity): 24 ng/L — ABNORMAL HIGH (ref <18)

## 2022-01-25 LAB — PREPARE RBC (CROSSMATCH)

## 2022-01-25 LAB — MRSA NEXT GEN BY PCR, NASAL: MRSA by PCR Next Gen: NOT DETECTED

## 2022-01-25 SURGERY — CHEST TUBE INSERTION
Anesthesia: LOCAL | Laterality: Right

## 2022-01-25 MED ORDER — ROCURONIUM BROMIDE 10 MG/ML (PF) SYRINGE
PREFILLED_SYRINGE | INTRAVENOUS | Status: AC
  Administered 2022-01-25: 50 mg
  Filled 2022-01-25: qty 10

## 2022-01-25 MED ORDER — NOREPINEPHRINE 4 MG/250ML-% IV SOLN: 0.0000 ug/min | INTRAVENOUS | Status: DC

## 2022-01-25 MED ORDER — ORAL CARE MOUTH RINSE
15.0000 mL | OROMUCOSAL | Status: DC
  Administered 2022-01-25 (×2): 15 mL via OROMUCOSAL

## 2022-01-25 MED ORDER — NOREPINEPHRINE 4 MG/250ML-% IV SOLN
2.0000 ug/min | INTRAVENOUS | Status: DC
  Filled 2022-01-25: qty 250

## 2022-01-25 MED ORDER — ETOMIDATE 2 MG/ML IV SOLN
INTRAVENOUS | Status: AC
  Administered 2022-01-25: 10 mg
  Filled 2022-01-25: qty 20

## 2022-01-25 MED ORDER — LIP MEDEX EX OINT
TOPICAL_OINTMENT | CUTANEOUS | Status: DC | PRN
  Filled 2022-01-25: qty 7

## 2022-01-25 MED ORDER — DEXMEDETOMIDINE HCL IN NACL 400 MCG/100ML IV SOLN
0.0000 ug/kg/h | INTRAVENOUS | Status: AC
  Administered 2022-01-25: 0.4 ug/kg/h via INTRAVENOUS
  Administered 2022-01-26: 1 ug/kg/h via INTRAVENOUS
  Administered 2022-01-27 – 2022-01-28 (×2): 0.5 ug/kg/h via INTRAVENOUS
  Filled 2022-01-25 (×6): qty 100

## 2022-01-25 MED ORDER — SODIUM CHLORIDE 0.9% IV SOLUTION: Freq: Once | INTRAVENOUS | Status: DC

## 2022-01-25 MED ORDER — NOREPINEPHRINE 4 MG/250ML-% IV SOLN
0.0000 ug/min | INTRAVENOUS | Status: DC
  Administered 2022-01-27: 2 ug/min via INTRAVENOUS
  Administered 2022-01-28: 7 ug/min via INTRAVENOUS
  Administered 2022-01-28: 8 ug/min via INTRAVENOUS
  Administered 2022-01-29: 2 ug/min via INTRAVENOUS
  Administered 2022-01-29: 3 ug/min via INTRAVENOUS
  Filled 2022-01-25 (×6): qty 250

## 2022-01-25 MED ORDER — FENTANYL CITRATE PF 50 MCG/ML IJ SOSY
25.0000 ug | PREFILLED_SYRINGE | INTRAMUSCULAR | Status: DC | PRN
  Administered 2022-01-25 – 2022-01-27 (×4): 50 ug via INTRAVENOUS
  Administered 2022-01-27: 100 ug via INTRAVENOUS
  Administered 2022-01-27 – 2022-01-28 (×5): 50 ug via INTRAVENOUS
  Administered 2022-01-28 – 2022-01-29 (×2): 100 ug via INTRAVENOUS
  Filled 2022-01-25: qty 1
  Filled 2022-01-25: qty 2
  Filled 2022-01-25: qty 1
  Filled 2022-01-25: qty 2
  Filled 2022-01-25: qty 1
  Filled 2022-01-25: qty 2
  Filled 2022-01-25 (×2): qty 1
  Filled 2022-01-25: qty 2
  Filled 2022-01-25 (×4): qty 1

## 2022-01-25 MED ORDER — SUCCINYLCHOLINE CHLORIDE 200 MG/10ML IV SOSY
PREFILLED_SYRINGE | INTRAVENOUS | Status: AC
  Filled 2022-01-25: qty 10

## 2022-01-25 MED ORDER — ORAL CARE MOUTH RINSE
15.0000 mL | OROMUCOSAL | Status: DC
  Administered 2022-01-26 (×4): 15 mL via OROMUCOSAL

## 2022-01-25 MED ORDER — DOCUSATE SODIUM 50 MG/5ML PO LIQD
100.0000 mg | Freq: Two times a day (BID) | ORAL | Status: DC
  Administered 2022-01-25 – 2022-01-26 (×3): 100 mg
  Filled 2022-01-25 (×3): qty 10

## 2022-01-25 MED ORDER — SODIUM CHLORIDE 0.9% IV SOLUTION: Freq: Once | INTRAVENOUS | Status: AC

## 2022-01-25 MED ORDER — ORAL CARE MOUTH RINSE
15.0000 mL | OROMUCOSAL | Status: DC | PRN
Start: 2022-01-25 — End: 2022-01-25

## 2022-01-25 MED ORDER — SODIUM CHLORIDE 0.9 % IV SOLN
250.0000 mL | INTRAVENOUS | Status: DC
  Administered 2022-01-30 – 2022-02-07 (×2): 250 mL via INTRAVENOUS

## 2022-01-25 MED ORDER — ACETAMINOPHEN 160 MG/5ML PO SOLN
650.0000 mg | Freq: Four times a day (QID) | ORAL | Status: DC | PRN
  Administered 2022-01-25 – 2022-01-29 (×2): 650 mg
  Filled 2022-01-25 (×2): qty 20.3

## 2022-01-25 MED ORDER — MUPIROCIN 2 % EX OINT
TOPICAL_OINTMENT | Freq: Two times a day (BID) | CUTANEOUS | Status: DC
  Administered 2022-01-27 – 2022-02-07 (×5): 1 via NASAL
  Filled 2022-01-25 (×8): qty 22

## 2022-01-25 MED ORDER — VANCOMYCIN HCL 750 MG/150ML IV SOLN
750.0000 mg | INTRAVENOUS | Status: DC
Start: 2022-01-25 — End: 2022-01-28
  Administered 2022-01-25 – 2022-01-28 (×4): 750 mg via INTRAVENOUS
  Filled 2022-01-25 (×4): qty 150

## 2022-01-25 MED ORDER — ORAL CARE MOUTH RINSE: 15.0000 mL | OROMUCOSAL | Status: DC | PRN

## 2022-01-25 MED ORDER — PANTOPRAZOLE 2 MG/ML SUSPENSION
40.0000 mg | Freq: Every day | ORAL | Status: DC
  Administered 2022-01-26 – 2022-01-28 (×3): 40 mg
  Filled 2022-01-25 (×3): qty 20

## 2022-01-25 MED ORDER — MIDAZOLAM HCL 2 MG/2ML IJ SOLN
INTRAMUSCULAR | Status: AC
  Administered 2022-01-25: 1 mg
  Filled 2022-01-25: qty 2

## 2022-01-25 MED ORDER — SALINE SPRAY 0.65 % NA SOLN
1.0000 | NASAL | Status: DC | PRN
Start: 2022-01-25 — End: 2022-02-11
  Filled 2022-01-25: qty 44

## 2022-01-25 MED ORDER — FENTANYL CITRATE PF 50 MCG/ML IJ SOSY
25.0000 ug | PREFILLED_SYRINGE | INTRAMUSCULAR | Status: DC | PRN
  Administered 2022-01-28: 25 ug via INTRAVENOUS
  Filled 2022-01-25 (×2): qty 1

## 2022-01-25 MED ORDER — FENTANYL CITRATE PF 50 MCG/ML IJ SOSY
PREFILLED_SYRINGE | INTRAMUSCULAR | Status: AC
  Administered 2022-01-25: 50 ug
  Filled 2022-01-25: qty 2

## 2022-01-25 MED ORDER — POLYETHYLENE GLYCOL 3350 17 G PO PACK
17.0000 g | PACK | Freq: Every day | ORAL | Status: DC
  Administered 2022-01-25: 17 g
  Filled 2022-01-25: qty 1

## 2022-01-25 NOTE — Progress Notes (Signed)
Critical hgb 6.9. Dr. Lynetta Mare verbally notified.  Orders to be placed.

## 2022-01-25 NOTE — Progress Notes (Signed)
PT Cancellation Note  Patient Details Name: [name] MRN: [id number] DOB: 11-May-1941   Cancelled Treatment:    Reason Eval/Treat Not Completed: Medical issues which prohibited therapy, attempted PT session, pt on 8L via venturi mask with increased WOB at rest supine with HOB elevated. Will re-attempt tomorrow to continue with PT POC.   Audry Riles. PTA Acute Rehabilitation Services Office: [place] 01/12/2022, 1:05 PM

## 2022-01-25 NOTE — Progress Notes (Signed)
MD notified PCCM to come to bedside in regards to change in respiratory status  . PCCM assessed pt no new orders   01/06/2022 1220  Vitals  Temp (!) 100.8 F (38.2 C)  Temp Source Axillary  BP 107/70  MAP (mmHg) 81  BP Location Left Arm  BP Method Automatic  Patient Position (if appropriate) Lying  Pulse Rate (!) 105  Pulse Rate Source Monitor  ECG Heart Rate (!) 118  Resp 20  Level of Consciousness  Level of Consciousness Alert  Oxygen Therapy  SpO2 90 %  O2 Device Venturi Mask  O2 Flow Rate (L/min) 8 L/min  Pain Assessment  Pain Scale 0-10  Pain Score 0  MEWS Score  MEWS Temp 1  MEWS Systolic 0  MEWS Pulse 2  MEWS RR 0  MEWS LOC 0  MEWS Score 3  MEWS Score Color Yellow

## 2022-01-25 NOTE — Progress Notes (Signed)
[facility] Progress Note Patient Name: [name] DOB: Sep 02, 1940 MRN: [id number]   Date of Service  01/29/2022  HPI/Events of Note  Patient intubated today. Attempting to pull ETT  eICU Interventions  Bilateral wrist restraints ordered     Intervention Category Minor Interventions: Agitation / anxiety - evaluation and management  Amadea Keagy Rodman Pickle 01/29/2022, 8:19 PM

## 2022-01-25 NOTE — Progress Notes (Signed)
Pharmacy Antibiotic Note  [name] is a 81 y.o. female admitted on 12/31/2021 with sepsis. Admitted on 12/16/2021, s/p PEA arrest 2/2 to respiratory failure, s/p antibiotics for CAP and septic shock 2/2 ischemic colitis with perforation, s/p repair. Pharmacy has been consulted for vancomycin and cefepime dosing for new sepsis concerning for abscess.   Renal function has improved, with CrCl 40-50 mL/min. Possible IR intervention for fluid abscess today.  Plan: Change to vancomycin 732m Q24H for easier dosing schedule. eAUC 414 with improved renal function (Scr 0.83) Continue Cefepime 2g IV q12h  Continue Metronidazole 500 mg IV q12h  Monitor renal function, clinical s/sx infection, culture results  Obtain vancomycin levels at steady state   Height: 5' (152.4 cm) Weight: 66.8 kg (147 lb 4.3 oz) IBW/kg (Calculated) : 45.5  Temp (24hrs), Avg:98.3 F (36.8 C), Min:97.4 F (36.3 C), Max:99.3 F (37.4 C)  Recent Labs  Lab 01/30/2022 2255 01/19/22 0645 01/20/22 0320 01/21/22 0513 01/22/22 0513 01/23/22 0530 01/23/22 1658 01/24/22 0432 01/24/22 0926 01/24/22 1040 01/29/2022 0530  WBC  --    < > 14.0* 9.5  --  21.0*  --   --  14.9* 17.5*  --   CREATININE  --    < > 0.55 0.72 0.70 0.88  --  1.00  --   --  0.83  LATICACIDVEN 1.3  --   --   --   --   --  1.1  --   --   --   --    < > = values in this interval not displayed.     Estimated Creatinine Clearance: 47.6 mL/min (by C-G formula based on SCr of 0.83 mg/dL).    Allergies  Allergen Reactions   Chlorhexidine Gluconate Hives, Swelling and Rash       "ChloraPrep "   Doxycycline Nausea And Vomiting   Codeine Nausea Only and Other (See Comments)    Pt can tolerate when pre-medicated   Doxycycline Nausea And Vomiting   Nsaids     avoid due to gerd   Nsaids Other (See Comments)    Gi bleed   Tylenol [Acetaminophen]     Avoid due to GERD   Adhesive [Tape] Rash    Dermabond   Chlorhexidine Swelling and Rash   Codeine  Nausea Only   Tylenol [Acetaminophen] Other (See Comments)    GI bleed    Antimicrobials this admission: Azithro 7/28 >> 8/2 Ceftriaxone 7/28>> 7/29 Vanc 7/30>> 8/1; 8/19>> Cefepime 7/31>> 8/3; 8/19>> Flagyl 8/19 >> Zosyn 8/6> 8/14 Micafungin 8/7>8/11  Dose adjustments this admission:  Microbiology results: 8/18 Bcx - NGTD Pleural fluid 7/31: ngtd Bcx 7/28: ngtd  BAL 7/30: ngtd  Ucx 7/29: ng  MRSA PCR 7/28: neg   Thank you for allowing pharmacy to be a part of this patient's care.  LErskine Speed PharmD 01/29/2022  9:38 AM

## 2022-01-25 NOTE — Progress Notes (Signed)
NAME:  [name], MRN:  [id number], DOB:  12-17-1940, LOS: 24 ADMISSION DATE:  01/02/2022, CONSULTATION DATE:  01/30/2022 REFERRING MD:  Bonner Puna, CHIEF COMPLAINT:  Hypotension, abdominal distention    History of Present Illness:  81 yo female admited for CAP causing respiratory failure, had a cardiac arrest after admission and was moved to the ICU.  Chest tube placed for parapneumonic effusion, extubated on 8/2. Sent out of ICU on 8/5. 8/6 developed ischemic colitis initially treated conservatively but this morning we were called back 8/7 for worsening shock, confusion-- planned for OR  PCCM signed off again 8/18   Reconsulted 8/21 for pulm consult to eval for chest tube, however pt has started to become more hypotensive and tachypnic. On BiPAP  Pertinent  Medical History  Breast ca on letrozole Afib on [place] Hospital Events: Including procedures, antibiotic start and stop dates in addition to other pertinent events   7/28 admit, Ceftriaxone/Azithro 7/29 last dose xeralto  7/30 IHCA x 7 min, Ceftriaxone>Cefepime 7/31 Chest tube placed, heparin gtt started 8/1 patient remove chest tube.  Head CT obtained >negative.  Severely agitated overnight.  Vancomycin stopped.  Azithromycin stopped. 8/6 Transferred to [facility] 8/7 Called CCM back for abd, Distention , hypotension and a fib with RVR, plan is for  urgent OR pending goals of care discussion then ICU post op if husband is in agreement. 8/10 Abdomen closed yesterday, SBT/SAT today 8/11 extubated yesterday, doing well on [place], up OOB, feeling alright 8/14 chest ultrasound shows atelectasis without significant right basilar effusion. 8/17 PCCM sign off 8/19 cefepime flagyl vanc started  8/21 PCCM re consult to eval for chest tube. Pt became more unstable as day went on   Interim History / Subjective:   Elevated temps to low 100.8 today Was on venturi   Started on BiPAP  Soft pressures  Decompensated rapidly, see  note   Objective   Blood pressure (!) 82/48, pulse 81, temperature (!) 100.4 F (38 C), temperature source Axillary, resp. rate 20, height 5' (1.524 m), weight 66.8 kg, SpO2 (!) 68 %.        Intake/Output Summary (Last 24 hours) at 01/26/2022 1418 Last data filed at 01/19/2022 4174 Gross per 24 hour  Intake 580 ml  Output 350 ml  Net 230 ml   Filed Weights   01/23/22 0505 01/24/22 0350 01/23/2022 0548  Weight: 66 kg 66.1 kg 66.8 kg    Physical exam    General:Unresponsive, hypotensive patient on BiPAP, sats of 70%,  Neuro:Not following commands , not answering questions, obtunded HEENT: NCAT with nasal bleeding noted, trace JVD when flat, No LAD CV: S1, S2, RRR , SB at times per tele, cap refill > 3 minutes Pulm: Bilateral chest excursion, minimal air movement , On BiPAP, rales per right and clear but diminished per L, GI: Soft, NT, ND, BS diminished, Body mass index is 28.76 kg/m.  GU: Concentrated amber urine noted MSK: No obvious deformities, + muscle wasting,  Integ: Skin mottled, cool to touch, intact   Resolved Hospital Problem list   Septic shock Hypoxic Respiratory Failure Cardiac arrest  Assessment & Plan:   Acute respiratory failure with hypoxemia, multifactorial CAP with parapneumonic effusion, s/p R pigtail (removed) Atelectasis Elevated R hemidiaphragm (? Neuromusc wkness) P -transfer to ICU  -plan for intubation in unit  -VAP, PAD, pulm hygiene  -initial call 8/21 was to eval for possible repeat chest tube, will defer this consideration until after intubation  -  Continue ventilator support with lung protective strategies  - Wean PEEP and FiO2 for sats greater than 90%. - Head of bed elevated 30 degrees. - Plateau pressures less than 30 cm H20.  - Follow intermittent chest x-ray and ABG.  >> worsening effusions and pulmonary edema 3/13 - SAT/SBT as tolerated, mentation preclude extubation  - Ensure adequate pulmonary hygiene  - Follow cultures  -  VAP bundle in place  - PAD protocol  - ABG in 1 hour - CXR in am and prn  Shock --favor septic shock in setting of Intra abdominal fluid collection  -elevated temp but not technically febrile. Had leukocytosis prior to abx initiation, but now WBC 9 -Re source -- Abdominal collection on CT. Does also have a R pleural effusion. Also has a PICC  but NGTD on Bcx 8/18. UA with 10k e coli -Re shock state -- favor septic shock. features arguing against septic shock are PCT, no true fever, no leukocytosis (but was already started on abx). Other consideration would be hemorrhagic with drop in hgb though there isnt an overt bleed and there are other reasons for hgb drop or hypovolemic with diarrhea  P -Cont broad abx  -start NE for MAP goal of 65 mm Hg  -1 PRBC -intra abd process as below  - blood Cx x 2 - Sputum Cx - Procalcitonin  Ischemic bowel with perf, s/p ex lap, intestinal anastomosis Intraabdominal fluid collection  -?anastomotic leak vs post op abscess  P -IR consulted 8/21 for drainage  -abx as above  - ongoing re-evaluation for drainage of fluid/ potential septic source  Afib, largely rate controlled PAH group 2  P -holding diuretics, beta blocker in setting of shock  -hold systemic AC   Acute anemia, symptomatic  -post op, critical illness, iatrogenic losses P -send type and screen  -Will order 1 PRBC  - Trend cbc - monitor for bleeding  Severe protein calorie malnutrition  -diet per CCS     Best Practice   Code Status: Full  Diet: NPO L/T/D: PICC  VTE ppx: SCD GI ppx: PPI Family communication:   Dr. Lynetta Mare updated husband 8/21 after transfer to ICU  [place], MSN, AGACNP-BC [place] See [place] for personal pager PCCM on call pager [phone]  01/16/2022 4:23 PM

## 2022-01-25 NOTE — Progress Notes (Signed)
OT Cancellation Note  Patient Details Name: [name] MRN: [id number] DOB: 10-10-1940   Cancelled Treatment:    Reason Eval/Treat Not Completed: Medical issues which prohibited therapy Attempted OT session though pt newly on 10 L with increased WOB at rest in bed. Will hold therapy attempts today and follow-up tomorrow pending pt readiness to participate.   [name] 01/10/2022, 1:01 PM

## 2022-01-25 NOTE — Consult Note (Signed)
Chief Complaint: Patient was seen in consultation today for intra-abdominal fluid collection  Referring Physician(s): Dr. Barry Dienes  Supervising Physician: Daryll Brod  Patient Status: [facility] - Out-pt  History of Present Illness: [name] is a 81 y.o. female anxiety, GERD, HTN, a fib on Xarelto, aditted with CAP PNA and respiratory failure who also suffered in-house cardiac arrest.  ROSC was achieved and patient stabilized, however she developed bowel ischemia and underwent ex lap 8/7.  She now has increased wound drainage with concern for anastomotic leak. Her last dose of Xarelto was 01/24/22.   IR consulted for intra-abdominal fluid collection aspiration and drainage. Case reviewed and approved by Dr. Annamaria Boots.   PA to bedside to assess.  Patient on Bipap with respiratory at bedside.  She appears to be in distress with accessory muscle use and skin discoloration. She does track me with her eyes.  Abdomen distended, wet-to-dry dressing with small amount of yellow drainage.  BP 88/59, HR 12.   Past Medical History:  Diagnosis Date   Anemia    history of   Anxiety    Arthritis    Asthma    Breast cancer ([place])    Cancer ([place])    skin cancers, one melanoma    Complication of anesthesia    patient woke up during colonoscopy   Concussion    8 yrs. 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    Keratosis, actinic    Melanoma ([place])    Persistent atrial fibrillation ([place]) 12/16/2016   CHA2DS2VASC score 3   Pneumonia    hx of     Past Surgical History:  Procedure Laterality Date   brachial skin removal due to deforimity Bilateral    BREAST ENHANCEMENT  SURGERY Left 11/14/2018   Procedure: LEFT BREAST AUGMENTATION;  Surgeon: [name], MD;  Location: [place];  Service: Plastics;  Laterality: Left;   BREAST RECONSTRUCTION Right 06/26/2018   BREAST RECONSTRUCTION WITH PLACEMENT OF TISSUE EXPANDER AND ALLODERM Right 06/26/2018   Procedure: RIGHT BREAST RECONSTRUCTION WITH PLACEMENT OF TISSUE EXPANDER;  Surgeon: [name], MD;  Location: [place];  Service: Plastics;  Laterality: Right;   BREAST RECONSTRUCTION WITH PLACEMENT OF TISSUE EXPANDER AND FLEX HD (ACELLULAR HYDRATED DERMIS) Right 01/28/2017    RIGHT BREAST RECONSTRUCTION WITH PLACEMENT OF TISSUE EXPANDER AND ALLODERM;  Surgeon: [name], MD;  Location: [place];  Service: Plastics;  Laterality: Right;   BUNIONECTOMY     left    CATARACT EXTRACTION     bilaterla cataract surgery    COLON RESECTION N/A 01/10/2022   Procedure: COLON RESECTION;  Surgeon: Ralene Ok, MD;  Location: [place];  Service: General;  Laterality: N/A;   COLONOSCOPY W/ POLYPECTOMY     JOINT REPLACEMENT     KNEE ARTHROSCOPY     left    LAPAROTOMY N/A 01/26/2022   Procedure: EXPLORATORY LAPAROTOMY;  Surgeon: Ralene Ok, MD;  Location: [place];  Service: General;  Laterality: N/A;   LAPAROTOMY N/A 01/24/2022   Procedure: EXPLORATORY LAPAROTOMY WITH INTESTINAL ANASTOMOSIS AND ABDOMINAL CLOSURE;  Surgeon: Ralene Ok, MD;  Location: [facility]  OR;  Service: General;  Laterality: N/A;   LATISSIMUS FLAP TO BREAST Right 06/26/2018   Procedure: LATISSIMUS FLAP TO RIGHT CHEST;  Surgeon: [name], MD;  Location: [place];  Service: Plastics;  Laterality: Right;   MASTECTOMY Right    MASTECTOMY W/ SENTINEL NODE BIOPSY Right 01/28/2017   Procedure: RIGHT TOTAL MASTECTOMY WITH SENTINEL LYMPH NODE BIOPSY;  Surgeon: Excell Seltzer, MD;  Location: [place];  Service: General;  Laterality: Right;   MASTOPEXY Left 11/14/2018   Procedure: LEFT BREAST MASTOPEXY;  Surgeon: [name], MD;   Location: [place];  Service: Plastics;  Laterality: Left;   MELANOMA EXCISION Left    polyp removed from uterus      REMOVAL OF TISSUE EXPANDER AND PLACEMENT OF IMPLANT Right 11/14/2018   Procedure: REMOVAL OF RIGHT TISSUE EXPANDER AND PLACEMENT OF SALINE IMPLANT;  Surgeon: [name], MD;  Location: [place];  Service: Plastics;  Laterality: Right;   right wrist pinning      skin cancers removed     TISSUE EXPANDER PLACEMENT Right 02/27/2018   Procedure: REMOVAL OF TISSUE EXPANDER;  Surgeon: [name], MD;  Location: [place];  Service: Plastics;  Laterality: Right;   TOTAL KNEE ARTHROPLASTY Right 01/29/2013   Procedure: RIGHT TOTAL KNEE ARTHROPLASTY;  Surgeon: Gearlean Alf, MD;  Location: [facility];  Service: Orthopedics;  Laterality: Right;   TOTAL KNEE ARTHROPLASTY Left 08/29/2017   Procedure: LEFT TOTAL KNEE ARTHROPLASTY;  Surgeon: Gaynelle Arabian, MD;  Location: [facility];  Service: Orthopedics;  Laterality: Left;    Allergies: Chlorhexidine gluconate, Doxycycline, Codeine, Doxycycline, Nsaids, Nsaids, Tylenol [acetaminophen], Adhesive [tape], Chlorhexidine, Codeine, and Tylenol [acetaminophen]  Medications: Prior to Admission medications   Medication Sig Start Date End Date Taking? Authorizing Provider  albuterol (VENTOLIN HFA) 108 (90 Base) MCG/ACT inhaler Inhale 1 puff into the lungs every 6 (six) hours as needed for wheezing or shortness of breath.   Yes [provider]  ALPRAZolam Duanne Moron) 0.5 MG tablet Take 0.5 mg by mouth daily as needed for anxiety.    Yes [provider]  B Complex Vitamins (VITAMIN B COMPLEX PO) Take 1 capsule by mouth daily.   Yes [provider]  BREO ELLIPTA 100-25 MCG/INH AEPB Inhale 1 puff into the lungs daily. 12/22/16  Yes [provider]  COLLAGEN PO Take 1 capsule by mouth daily.   Yes [provider]  desonide (DESOWEN) 0.05 % cream Apply 1 application topically 2  (two) times daily as needed (for skin irritation.).   Yes [provider]  diclofenac sodium (VOLTAREN) 1 % GEL Apply 2 g topically daily as needed (pain).    Yes [provider]  EPINEPHrine 0.3 mg/0.3 mL IJ SOAJ injection Inject 0.3 mg into the muscle as needed for anaphylaxis.   Yes [provider]  famotidine (PEPCID) 20 MG tablet Take 1 tablet by mouth as directed. 06/29/19  Yes [provider]  fluorouracil (EFUDEX) 5 % cream Apply 1 application  topically daily. Per MD instructions, for skin cancer 12/03/20  Yes [provider]  fluticasone (FLONASE) 50 MCG/ACT nasal spray Place 2 sprays into both nostrils daily.   Yes [provider]  irbesartan (AVAPRO) 150 MG tablet Take 1 tablet (150 mg total) by mouth daily. 04/10/21  Yes Lelon Perla, MD  letrozole [facility]) 2.5 MG tablet TAKE 1 TABLET BY MOUTH EVERY DAY 05/29/21  Yes Magrinat, Virgie Dad, MD  levocetirizine (XYZAL) 5 MG tablet Take 5 mg  by mouth at bedtime. 09/04/20  Yes [provider]  Methylsulfonylmethane (MSM PO) Take 1 capsule by mouth daily.   Yes [provider]  MILK THISTLE PO Take 1 capsule by mouth daily.   Yes [provider]  Misc Natural Products (PYCNOGENOL COMPLEX PO) Take 1 capsule by mouth daily.   Yes [provider]  Omega-3 Fatty Acids (OMEGA 3 PO) Take 1 capsule by mouth daily.   Yes [provider]  OVER THE COUNTER MEDICATION Take 1 capsule by mouth daily. Immunity supplement   Yes [provider]  oxyCODONE (ROXICODONE) 5 MG immediate release tablet Take 1 tablet (5 mg total) by mouth every 4 (four) hours as needed for severe pain. 09/02/21  Yes Gareth Morgan, MD  Polyethyl Glycol-Propyl Glycol (SYSTANE OP) Place 1 drop into both eyes daily.   Yes [provider]  potassium chloride (KLOR-CON) 20 MEQ packet Take 20 mEq by mouth daily.   Yes [provider]  propranolol ER (INDERAL LA)  80 MG 24 hr capsule Take 80 mg by mouth daily.   Yes [provider]  TURMERIC PO Take 1 capsule by mouth daily.   Yes [provider]  venlafaxine XR (EFFEXOR-XR) 75 MG 24 hr capsule TAKE 1 CAPSULE BY MOUTH DAILY WITH BREAKFAST. 12/30/21  Yes Iruku, Arletha Pili, MD  VITAMIN D PO Take 1 capsule by mouth daily.   Yes [provider]  XARELTO 20 MG TABS tablet TAKE 1 TABLET BY MOUTH DAILY WITH SUPPER Patient taking differently: Take 20 mg by mouth daily. 09/29/21  Yes Vickie Epley, MD     Family History  Problem Relation Age of Onset   Hypertension Other    COPD Father    Breast cancer Other        dx in her 60s-30s    Social History   Socioeconomic History   Marital status: Married    Spouse name: Not on file   Number of children: Not on file   Years of education: Not on file   Highest education level: Not on file  Occupational History   Not on file  Tobacco Use   Smoking status: Never   Smokeless tobacco: Never  Vaping Use   Vaping Use: Never used  Substance and Sexual Activity   Alcohol use: Yes    Alcohol/week: 1.0 standard drink of alcohol    Types: 1 Glasses of wine per week    Comment: occ   Drug use: No   Sexual activity: Not on file  Other Topics Concern   Not on file  Social History Narrative   ** Merged History Encounter **       Social Determinants of Health   Financial Resource Strain: Not on file  Food Insecurity: Not on file  Transportation Needs: Not on file  Physical Activity: Not on file  Stress: Not on file  Social Connections: Not on file     Review of Systems: A 12 point ROS discussed and pertinent positives are indicated in the HPI above.  All other systems are negative.  Review of Systems  Unable to perform ROS: Acuity of condition    Vital Signs: BP (!) 82/48   Pulse 81   Temp (!) 100.4 F (38 C) (Axillary)   Resp 20   Ht 5' (1.524 m)   Wt 147 lb 4.3 oz (66.8 kg)   SpO2 (!) 68%   BMI 28.76 kg/m    Physical Exam Constitutional:  General: She is in acute distress.     Appearance: She is ill-appearing.  HENT:     Mouth/Throat:     Pharynx: Oropharynx is clear.  Cardiovascular:     Rate and Rhythm: Tachycardia present.  Pulmonary:     Effort: Respiratory distress present.  Abdominal:     General: There is distension.     Tenderness: There is no abdominal tenderness.  Neurological:     Mental Status: She is alert.      MD Evaluation Airway: WNL Heart: WNL Abdomen: WNL Chest/ Lungs: WNL ASA  Classification: 3 Mallampati/Airway Score: Two   Imaging: CT ABDOMEN PELVIS W CONTRAST  Result Date: 01/24/2022 CLINICAL DATA:  Abdominal pain, acute, nonlocalized EXAM: CT ABDOMEN AND PELVIS WITH CONTRAST TECHNIQUE: Multidetector CT imaging of the abdomen and pelvis was performed using the standard protocol following bolus administration of intravenous contrast. RADIATION DOSE REDUCTION: This exam was performed according to the departmental dose-optimization program which includes automated exposure control, adjustment of the mA and/or kV according to patient size and/or use of iterative reconstruction technique. CONTRAST:  165m OMNIPAQUE IOHEXOL 300 MG/ML  SOLN COMPARISON:  01/10/2022 FINDINGS: Lower chest: Focal consolidation within the basilar right middle lobe is again seen, not fully assessed on this examination. Moderate right pleural effusion appears slightly enlarged since prior examination with compressive atelectasis of the right lower lobe. Cardiac size is mildly enlarged. Central venous catheter tip noted within the right atrium. Bilateral breast implants are noted. Surgical changes of right mastectomy are suspected. Hepatobiliary: No focal liver abnormality is seen. No gallstones, gallbladder wall thickening, or biliary dilatation. Pancreas: Unremarkable Spleen: Unremarkable Adrenals/Urinary Tract: The adrenal glands are unremarkable. The kidneys are normal. The bladder is  largely decompressed. Stomach/Bowel: Mild descending and sigmoid colonic diverticulosis without superimposed acute inflammatory change. Surgical changes of right hemicolectomy are identified. A extraluminal collection of gas and fluid is seen within the right mid abdomen anteriorly measuring 2.9 x 5.9 x 8.1 cm in greatest dimension, possibly representing an anastomotic leak given its close association with the anastomotic staple line, postoperative fluid collection, or abscess. There is extensive infiltrative change within the mesentery of the right mid abdomen, possibly postsurgical in nature or inflammatory given the adjacent fluid collection. Mild ascites is present. Stomach is unremarkable. Mild bowel wall thickening involving several loops of distal small bowel within the pelvis, again possibly postsurgical in nature. No evidence of obstruction. No free intraperitoneal gas. Vascular/Lymphatic: Aortic atherosclerosis. No enlarged abdominal or pelvic lymph nodes. Reproductive: Uterus and bilateral adnexa are unremarkable. Other: Mild diffuse subcutaneous body wall edema in keeping with anasarca. Superficial dehiscence of the midline laparotomy wound. The abdominal fascia appears intact. Musculoskeletal: Degenerative changes seen within the lumbar spine. No lytic or blastic bone lesion. IMPRESSION: 1. Surgical changes of right hemicolectomy. Extraluminal collection of gas and fluid within the right mid abdomen anteriorly measuring 2.9 x 5.9 x 8.1 cm in greatest dimension, possibly representing an anastomotic leak given its close association with the anastomotic staple line, postoperative fluid collection, or abscess. 2. Extensive infiltrative change within the mesentery of the right mid abdomen, possibly postsurgical in nature or inflammatory given the adjacent fluid collection. 3. Mild bowel wall thickening involving several loops of distal small bowel within the pelvis, again possibly postsurgical in nature. No  evidence of obstruction. 4. Slight interval enlargement of right pleural effusion, progressive body wall subcutaneous edema, and development of mild ascites in keeping with progressive anasarca. 5. Focal consolidation within the basilar right middle lobe  again noted, not fully assessed on this examination. Aortic Atherosclerosis (ICD10-I70.0). Electronically Signed   By: Fidela Salisbury M.D.   On: 01/24/2022 01:33   DG Chest Port 1 View  Result Date: 01/22/2022 CLINICAL DATA:  Fever.  Respiratory distress. EXAM: PORTABLE CHEST 1 VIEW COMPARISON:  Radiograph 01/19/2022.  CT 01/03/2022 FINDINGS: Left upper extremity PICC tip unchanged. Moderate-sized right pleural effusion is minimally improved from prior exam. There may be slight improvement in right perihilar airspace disease. Minor atelectasis at the left lung base. No other left lung consolidation. No pneumothorax. Stable heart size and mediastinal contours. IMPRESSION: 1. Moderate-sized right pleural effusion is minimally improved from prior exam. 2. Slight improvement in right perihilar airspace disease. 3. Minor left basilar atelectasis. Electronically Signed   By: Keith Rake M.D.   On: 01/22/2022 18:15   VAS [place] UPPER EXTREMITY VENOUS DUPLEX  Result Date: 01/21/2022 UPPER VENOUS STUDY  Patient Name:  [name]  Date of Exam:   01/21/2022 Medical Rec #: [id number]        Accession #:    [id number] Date of Birth: [birth year]        Patient Gender: F Patient Age:   74 years Exam Location:  [facility] Procedure:      VAS [place] UPPER EXTREMITY VENOUS DUPLEX Referring Phys: [facility] EZENDUKA --------------------------------------------------------------------------------  Indications: Left arm swelling and erythema, PICC Comparison Study: No prior studies. Performing Technologist: Darlin Coco RDMS, RVT  Examination Guidelines: A complete evaluation includes B-mode imaging, spectral Doppler, color Doppler, and power Doppler as needed of all  accessible portions of each vessel. Bilateral testing is considered an integral part of a complete examination. Limited examinations for reoccurring indications may be performed as noted.  Right Findings: +----------+------------+---------+-----------+----------+-------+ RIGHT     CompressiblePhasicitySpontaneousPropertiesSummary +----------+------------+---------+-----------+----------+-------+ Subclavian               Yes       Yes                      +----------+------------+---------+-----------+----------+-------+  Left Findings: +----------+------------+---------+-----------+----------+---------------+ LEFT      CompressiblePhasicitySpontaneousProperties    Summary     +----------+------------+---------+-----------+----------+---------------+ IJV           Full       Yes       Yes                              +----------+------------+---------+-----------+----------+---------------+ Subclavian    Full       Yes       Yes                              +----------+------------+---------+-----------+----------+---------------+ Axillary      Full       Yes       Yes                              +----------+------------+---------+-----------+----------+---------------+ Brachial      Full                                                  +----------+------------+---------+-----------+----------+---------------+ Radial        Full                                                  +----------+------------+---------+-----------+----------+---------------+  Ulnar         Full                                                  +----------+------------+---------+-----------+----------+---------------+ Cephalic      Full                                                  +----------+------------+---------+-----------+----------+---------------+ Basilic       Full                                  PICC visualized  +----------+------------+---------+-----------+----------+---------------+  Summary:  Right: No evidence of thrombosis in the subclavian.  Left: No evidence of deep vein thrombosis in the upper extremity. No evidence of superficial vein thrombosis in the upper extremity.  *See table(s) above for measurements and observations.  Diagnosing physician: Monica Martinez MD Electronically signed by Monica Martinez MD on 01/21/2022 at 3:19:11 PM.    Final    DG CHEST PORT 1 VIEW  Result Date: 01/19/2022 CLINICAL DATA:  Dyspnea EXAM: PORTABLE CHEST 1 VIEW COMPARISON:  02/03/2022 FINDINGS: No significant change in AP portable examination. Large right pleural effusion and associated atelectasis or consolidation. The left lung is normally aerated. Heart and mediastinum unremarkable. Left upper extremity PICC. IMPRESSION: No significant change in AP portable examination. Large right pleural effusion and associated atelectasis or consolidation. The left lung is normally aerated. No new airspace opacity. Electronically Signed   By: Delanna Ahmadi M.D.   On: 01/19/2022 08:47   DG Chest Port 1 View  Result Date: 01/26/2022 CLINICAL DATA:  Acute respiratory distress. EXAM: PORTABLE CHEST 1 VIEW COMPARISON:  January 18, 2022 (10:04 a.m.) FINDINGS: There is stable left-sided PICC line positioning. The cardiac silhouette is enlarged and unchanged in size. There is stable opacification of the mid and lower right lung. The left lung is clear. No pneumothorax is identified. The visualized skeletal structures are unremarkable. IMPRESSION: Stable opacification of mid and lower right lung, likely representing a combination of pleural effusion and atelectasis/infiltrate. Electronically Signed   By: Virgina Norfolk M.D.   On: 01/23/2022 19:31   ECHOCARDIOGRAM LIMITED  Result Date: 01/12/2022    ECHOCARDIOGRAM LIMITED REPORT   Patient Name:   [name] Date of Exam: 01/05/2022 Medical Rec #:  [id number]       Height:        60.0 in Accession #:    [id number]      Weight:       166.0 lb Date of Birth:  April 23, 1941       BSA:          1.725 m Patient Age:    27 years        BP:           161/70 mmHg Patient Gender: F               HR:           78 bpm. Exam Location:  Inpatient Procedure: Limited Echo, Intracardiac Opacification Agent, Limited Color Doppler            and Cardiac Doppler Indications:  Congestive Heart Failure I50.9  History:        Patient has prior history of Echocardiogram examinations, most                 recent 01/04/2022. Arrythmias:Atrial Fibrillation; Risk                 Factors:GERD and Hypertension.  Sonographer:    Bernadene Person RDCS Referring Phys: [place]  1. Left ventricular ejection fraction, by estimation, is 60 to 65%. Left ventricular ejection fraction by 2D MOD biplane is 60.6 %. The left ventricle has normal function. The left ventricle has no regional wall motion abnormalities. There is mild left ventricular hypertrophy. Left ventricular diastolic function could not be evaluated.  2. Right ventricular systolic function is moderately reduced. The right ventricular size is normal. There is severely elevated pulmonary artery systolic pressure. The estimated right ventricular systolic pressure is 25.0 mmHg.  3. The mitral valve is grossly normal. Trivial mitral valve regurgitation.  4. The aortic valve is tricuspid. Aortic valve regurgitation is mild to moderate. Aortic valve sclerosis is present, with no evidence of aortic valve stenosis. Aortic regurgitation PHT measures 227 msec.  5. The inferior vena cava is normal in size with <50% respiratory variability, suggesting right atrial pressure of 8 mmHg. Comparison(s): Changes from prior study are noted. 01/04/2022: LVEF 25-30%, Takatsubo-type wall motion abnormalities. FINDINGS  Left Ventricle: Left ventricular ejection fraction, by estimation, is 60 to 65%. Left ventricular ejection fraction by 2D MOD biplane is 60.6 %. The  left ventricle has normal function. The left ventricle has no regional wall motion abnormalities. The left ventricular internal cavity size was normal in size. There is mild left ventricular hypertrophy. Left ventricular diastolic function could not be evaluated. Left ventricular diastolic function could not be evaluated due to atrial fibrillation. Right Ventricle: The right ventricular size is normal. No increase in right ventricular wall thickness. Right ventricular systolic function is moderately reduced. There is severely elevated pulmonary artery systolic pressure. The tricuspid regurgitant velocity is 4.17 m/s, and with an assumed right atrial pressure of 8 mmHg, the estimated right ventricular systolic pressure is 03.7 mmHg. Mitral Valve: The mitral valve is grossly normal. Trivial mitral valve regurgitation. Tricuspid Valve: The tricuspid valve is grossly normal. Tricuspid valve regurgitation is mild. Aortic Valve: The aortic valve is tricuspid. Aortic valve regurgitation is mild to moderate. Aortic regurgitation PHT measures 227 msec. Aortic valve sclerosis is present, with no evidence of aortic valve stenosis. Pulmonic Valve: The pulmonic valve was grossly normal. Pulmonic valve regurgitation is trivial. Aorta: The aortic root and ascending aorta are structurally normal, with no evidence of dilitation. Venous: The inferior vena cava is normal in size with less than 50% respiratory variability, suggesting right atrial pressure of 8 mmHg. LEFT VENTRICLE PLAX 2D                        Biplane EF (MOD) LVIDd:         4.50 cm         LV Biplane EF:   Left LVIDs:         2.80 cm                          ventricular LV PW:         1.10 cm  ejection LV IVS:        1.10 cm                          fraction by LVOT diam:     2.00 cm                          2D MOD LV SV:         62                               biplane is LV SV Index:   36                               60.6 %. LVOT Area:      3.14 cm  LV Volumes (MOD) LV vol d, MOD    53.2 ml A2C: LV vol d, MOD    41.1 ml A4C: LV vol s, MOD    19.5 ml A2C: LV vol s, MOD    17.1 ml A4C: LV SV MOD A2C:   33.7 ml LV SV MOD A4C:   41.1 ml LV SV MOD BP:    30.0 ml RIGHT VENTRICLE TAPSE (M-mode): 1.2 cm LEFT ATRIUM         Index LA diam:    3.90 cm 2.26 cm/m  AORTIC VALVE LVOT Vmax:   110.00 cm/s LVOT Vmean:  72.700 cm/s LVOT VTI:    0.196 m AI PHT:      227 msec  AORTA Ao Root diam: 2.90 cm TRICUSPID VALVE TR Peak grad:   69.6 mmHg TR Vmax:        417.00 cm/s  SHUNTS Systemic VTI:  0.20 m Systemic Diam: 2.00 cm Lyman Bishop MD Electronically signed by Lyman Bishop MD Signature Date/Time: 01/14/2022/4:21:18 PM    Final    DG CHEST PORT 1 VIEW  Result Date: 01/17/2022 CLINICAL DATA:  Respiratory distress, shortness of breath EXAM: PORTABLE CHEST 1 VIEW COMPARISON:  01/17/2022 FINDINGS: Persistent right pleural effusion with adjacent atelectasis/consolidation. Change in appearance may be due to differences in positioning. Persistent probable mild atelectasis at the left lung base. Similar partially obscured cardiomediastinal contours. Left subclavian line is unchanged. IMPRESSION: Persistent right pleural effusion and adjacent atelectasis/consolidation. Electronically Signed   By: Macy Mis M.D.   On: 01/26/2022 10:22   DG CHEST PORT 1 VIEW  Result Date: 01/17/2022 CLINICAL DATA:  Shortness of breath. EXAM: PORTABLE CHEST 1 VIEW COMPARISON:  One-view chest x-ray 01/15/2022 FINDINGS: Patient has been extubated. The heart is enlarged. Right pleural effusion and right-sided airspace disease has increased. Minimal atelectasis at the left base is stable. Surgical clips are again noted in the right breast or axilla. IMPRESSION: 1. Increasing right pleural effusion and right-sided airspace disease. While this likely reflects atelectasis, infection is not excluded. 2. Stable cardiomegaly without failure. Electronically Signed   By: San Morelle  M.D.   On: 01/17/2022 11:39   DG Chest Port 1 View  Result Date: 02/01/2022 CLINICAL DATA:  Endotracheal tube and site 2.  Respiratory distress. EXAM: PORTABLE CHEST 1 VIEW COMPARISON:  One-view chest x-ray 01/12/2022 FINDINGS: Endotracheal tube terminates 1.5 cm above the carina and could be pulled back 1-2 cm for more optimal positioning. A Swan-Ganz catheter terminates at the pulmonary outflow tract. Enteric tube terminates  in the stomach. A left-sided PICC line is in place. The tip is in the right atrium. Right greater left pleural effusion remains. Bibasilar airspace disease is noted. IMPRESSION: 1. Endotracheal tube terminates 1.5 cm above the carina and could be pulled back 1-2 cm for more optimal positioning. 2. Swan-Ganz catheter terminates at the pulmonary outflow tract. 3. New left-sided PICC line terminates in the right atrium. 4. Enteric tube terminates in the stomach. 5. Right greater than left pleural effusions and bibasilar airspace opacities. This likely reflects atelectasis. Infection is not excluded. Electronically Signed   By: San Morelle M.D.   On: 01/09/2022 08:33   DG Chest Port 1 View  Result Date: 01/12/2022 CLINICAL DATA:  PICC line placement.  Endotracheal tube placement. EXAM: PORTABLE CHEST 1 VIEW COMPARISON:  Same day at 5:14 a.m. FINDINGS: Endotracheal tube has tip 2.6 cm above the carina. Right IJ Swan-Ganz catheter is present with tip over the main pulmonary artery segment. Interval placement of left-sided PICC line with tip at the cavoatrial junction. Nasogastric tube looped once in the stomach with tip in the right upper quadrant unchanged. Lungs are adequately inflated with moderate opacification over the right mid to lower lung likely effusion with associated atelectasis. Chest of a small amount left pleural fluid with basilar atelectasis. Infection over the lung bases is possible. Cardiomediastinal silhouette and remainder of the exam is unchanged. IMPRESSION: 1.  Persistent opacification over the right mid to lower lung likely effusion with associated atelectasis. Small amount left pleural fluid with basilar atelectasis. Infection over the lung bases is possible. 2. Tubes and lines as described. Left PICC line with tip at the cavoatrial junction. Electronically Signed   By: Marin Olp M.D.   On: 01/12/2022 12:53   [place] EKG SITE RITE  Result Date: 01/12/2022 If Site Rite image not attached, placement could not be confirmed due to current cardiac rhythm.  DG CHEST PORT 1 VIEW  Result Date: 01/12/2022 CLINICAL DATA:  Endotracheal tube placement, recent abdominal surgery, respiratory distress EXAM: PORTABLE CHEST 1 VIEW COMPARISON:  01/09/2022 FINDINGS: Endotracheal tube is 1.5 cm above the carina. Right IJ Swan-Ganz catheter tip overlies the main pulmonary artery. Enteric tube passes into the stomach. Persistent elevation of the right hemidiaphragm. Probable right larger than left pleural effusions with bibasilar atelectasis/consolidation. No pneumothorax. Similar cardiomediastinal contours. IMPRESSION: Lines and tubes as above. Probable right larger than left pleural effusions with bibasilar atelectasis/consolidation. Electronically Signed   By: Macy Mis M.D.   On: 01/12/2022 08:05   DG CHEST PORT 1 VIEW  Result Date: 01/15/2022 CLINICAL DATA:  Respiratory failure.  Endotracheal tube placement. EXAM: PORTABLE CHEST 1 VIEW COMPARISON:  January 11, 2022. FINDINGS: Endotracheal tube is noted in grossly good position. Nasogastric tube is seen with tip in proximal stomach. Right internal jugular Swan-Ganz catheter is now noted with tip in expected position of main pulmonary artery. Stable right basilar atelectasis or infiltrate is noted with associated effusion. Small left pleural effusion is noted. Bony thorax is unremarkable. IMPRESSION: Endotracheal and nasogastric tubes are in grossly good position. Right internal jugular Swan-Ganz catheter is noted with tip in  expected position of main pulmonary artery. Electronically Signed   By: Marijo Conception M.D.   On: 01/12/2022 13:10   DG Abd Portable 1V  Result Date: 02/04/2022 CLINICAL DATA:  Ileus. EXAM: PORTABLE ABDOMEN - 1 VIEW COMPARISON:  01/10/2022 FINDINGS: NG tube is been removed in the interval. Similar appearance of diffuse gaseous small bowel and colonic distension.  Contrast in the bladder is compatible with recent CT scan. IMPRESSION: Similar appearance of diffuse gaseous small bowel and colonic distension. Interval removal of NG tube. Electronically Signed   By: Misty Stanley M.D.   On: 01/26/2022 07:36   DG CHEST PORT 1 VIEW  Result Date: 01/14/2022 CLINICAL DATA:  NG tube placement. EXAM: PORTABLE CHEST 1 VIEW COMPARISON:  Earlier same day FINDINGS: 0618 hours. Stable asymmetric elevation right hemidiaphragm with right base collapse/consolidation and small right effusion. Minimal atelectasis noted left base with possible tiny left pleural effusion. The cardio pericardial silhouette is enlarged. NG tube tip is in the stomach with proximal side port below the GE junction. Telemetry leads overlie the chest. IMPRESSION: NG tube tip is in the stomach with proximal side port below the GE junction. Electronically Signed   By: Misty Stanley M.D.   On: 01/29/2022 06:33   DG CHEST PORT 1 VIEW  Result Date: 01/24/2022 CLINICAL DATA:  NG tube placement. EXAM: PORTABLE CHEST 1 VIEW COMPARISON:  01/10/2022 FINDINGS: 0610 hours. NG tube tip is positioned at the level of the mid esophagus. Asymmetric elevation right hemidiaphragm noted with right base collapse/consolidation and small right pleural effusion. Minimal atelectasis noted left base with tiny left effusion. The cardio pericardial silhouette is enlarged. Bones are diffusely demineralized. Telemetry leads overlie the chest. IMPRESSION: 1. NG tube tip positioned at the level of the mid esophagus. 2. Asymmetric elevation right hemidiaphragm with right base  collapse/consolidation and small right pleural effusion. Electronically Signed   By: Misty Stanley M.D.   On: 01/12/2022 06:33   DG Chest Port 1 View  Result Date: 01/10/2022 CLINICAL DATA:  Small bowel disease. Respiratory distress. EXAM: PORTABLE CHEST 1 VIEW COMPARISON:  January 06, 2022 FINDINGS: Enteric catheter curled on itself with tip collimated off the image at the level of the mid gastric body. Enlarged cardiac silhouette. Right pleural effusion and right lower lung field airspace consolidation. Mild atelectasis of the left lower lung. Osseous structures are without acute abnormality. Soft tissues are grossly normal. IMPRESSION: 1. Enteric catheter folded on itself with tip collimated off the image at the level of the mid gastric body. 2. Right pleural effusion and right lower lung field airspace consolidation. Electronically Signed   By: Fidela Salisbury M.D.   On: 01/10/2022 16:33   DG Abd Portable 1V  Result Date: 01/10/2022 CLINICAL DATA:  NG tube placement. EXAM: PORTABLE ABDOMEN - 1 VIEW COMPARISON:  01/10/2022 FINDINGS: Enteric feeding tube has been removed. A nasal/orogastric tube passes well below the diaphragm, tip projecting in the distal stomach. Dilated loops of small bowel noted centrally, unchanged from the earlier study. IMPRESSION: Well-positioned nasal/orogastric tube. Electronically Signed   By: Lajean Manes M.D.   On: 01/10/2022 16:32   CT ABDOMEN PELVIS W CONTRAST  Result Date: 01/10/2022 CLINICAL DATA:  Bowel obstruction.  Abdominal pain. EXAM: CT ABDOMEN AND PELVIS WITH CONTRAST TECHNIQUE: Multidetector CT imaging of the abdomen and pelvis was performed using the standard protocol following bolus administration of intravenous contrast. RADIATION DOSE REDUCTION: This exam was performed according to the departmental dose-optimization program which includes automated exposure control, adjustment of the mA and/or kV according to patient size and/or use of iterative  reconstruction technique. CONTRAST:  43m OMNIPAQUE IOHEXOL 300 MG/ML  SOLN COMPARISON:  Chest abdomen pelvis CT 01/03/2022 FINDINGS: Lower chest: Collapse/consolidation in the right lower lung persists but is improved since prior study. Small right pleural effusion noted. The cardio pericardial silhouette is enlarged. Hepatobiliary: No suspicious  focal abnormality within the liver parenchyma. There is no evidence for gallstones, gallbladder wall thickening, or pericholecystic fluid. No intrahepatic or extrahepatic biliary dilation. Pancreas: No focal mass lesion. No dilatation of the main duct. No intraparenchymal cyst. No peripancreatic edema. Spleen: No splenomegaly. No focal mass lesion. Adrenals/Urinary Tract: No adrenal nodule or mass. Cortical scarring noted in both kidneys. No hydronephrosis tiny hypodensities in both kidneys are too small to characterize but likely benign. Stomach/Bowel: Stomach is markedly distended with fluid. Feeding tube tip is in the antral region adjacent to the pylorus. Duodenum is normally positioned as is the ligament of Treitz. Small bowel loops are fluid-filled and distended up to 3.4 cm diameter. Small bowel loops in the right lower quadrant a completely decompressed. The terminal ileum is not discretely visible. Cecal tip is in the lateral right lower quadrant on the previous study and is now in the midline abdomen just deep to the umbilicus (image 16/9. There is pericolonic edema/inflammation associated with the cecum and ascending colon. Pneumatosis is noted in the cecum and ascending colon. No wall thickening or pneumatosis in the hepatic flexure, transverse colon, or left colon. Diffuse colonic diverticulosis evident. Vascular/Lymphatic: There is moderate atherosclerotic calcification of the abdominal aorta without aneurysm. Portal vein and superior mesenteric vein are patent. No evidence for portal venous gas. Celiac axis, SMA, and IMA are opacified. There is no  gastrohepatic or hepatoduodenal ligament lymphadenopathy. No retroperitoneal or mesenteric lymphadenopathy. No pelvic sidewall lymphadenopathy. Reproductive: Small posterior uterine fibroid evident. There is no adnexal mass. Other: Small volume free fluid is seen in the cul-de-sac in the para colic gutters bilaterally. Musculoskeletal: No worrisome lytic or sclerotic osseous abnormality. Body wall edema noted lower abdomen and pelvis. IMPRESSION: 1. Interval development of apparent pneumatosis involving the cecum and ascending colon. Imaging features concerning for ischemic colitis. While the apparent pneumatosis is best appreciated in segments of colon with adjacent intraluminal fluid, given the adjacent pericolonic edema/inflammation, pneumatosis is favored over gas trapped between intraluminal contents and the mucosal surface. Cecum is mobile and while it was in the lower right lower quadrant on the study from 1 week ago, it is now positioned in the midline upper pelvis/abdomen. No luminal narrowing in the cecum or ascending colon to confirm the presence of a cecal volvulus. 2. Small bowel loops in the right lower quadrant are decompressed while proximal and mid small bowel is fluid-filled and distended up to 3.4 cm. Although no discrete transition zone is identified, given the markedly decompressed distal small bowel the right lower quadrant, associated small bowel obstruction is a concern. Inflammatory ileus secondary to the right colonic process would also be a consideration. Right lower quadrant internal hernia not excluded. 3. Marked distention of the stomach with fluid. Tube tip is in the antral region adjacent to the pylorus. 4. Collapse/consolidation in the right lower lung persists but is improved since prior study. 5. Small volume free fluid in the cul-de-sac and para colic gutters bilaterally. 6. Aortic Atherosclerosis (ICD10-I70.0). These results will be called to the ordering clinician or  representative by the Radiologist Assistant, and communication documented in the PACS or [employer]. Electronically Signed   By: Misty Stanley M.D.   On: 01/10/2022 14:02   DG Abd 1 View  Result Date: 01/10/2022 CLINICAL DATA:  Abdominal tenderness with distension. EXAM: ABDOMEN - 1 VIEW COMPARISON:  01/08/2022 FINDINGS: Weighted enteric tube tip in the right upper quadrant in the region of the distal stomach or proximal duodenum. There is mild gaseous  gastric distension. Mild gaseous distention of small and large bowel in the central abdomen. No evidence of free air on this supine view. Surgical clips in the right upper quadrant. IMPRESSION: Mild gaseous gastric distension. Mild gaseous distention of small and large bowel in the central abdomen, favoring ileus. Electronically Signed   By: Keith Rake M.D.   On: 01/10/2022 02:51   DG Abd Portable 1V  Result Date: 01/08/2022 CLINICAL DATA:  Encounter for NG tube. EXAM: PORTABLE ABDOMEN - 1 VIEW COMPARISON:  Radiographs earlier the same date.  CT 01/03/2022. FINDINGS: 1523 hours. Feeding tube projects over the right upper quadrant of the abdomen, likely in the distal stomach or proximal duodenum. Central venous catheter projects over the mid right atrium. The bowel gas pattern remains nonobstructive. Unchanged right basilar airspace disease. IMPRESSION: Unchanged position of the feeding tube in the right upper quadrant, likely in the distal stomach or proximal duodenum. Electronically Signed   By: Richardean Sale M.D.   On: 01/08/2022 15:29   DG Abd Portable 1V  Result Date: 01/08/2022 CLINICAL DATA:  Provided history: Cortrak feeding tube placement. EXAM: PORTABLE ABDOMEN - 1 VIEW COMPARISON:  CT chest/abdomen/pelvis 01/03/2022. FINDINGS: Central venous catheter with tip terminating at the level of the superior cavoatrial junction. An enteric tube is present with tip terminating in the expected location of the distal stomach/proximal duodenum.  Nonobstructive bowel gas pattern. IMPRESSION: Enteric tube present with tip terminating in the expected location of the distal stomach/proximal duodenum. Electronically Signed   By: Kellie Simmering D.O.   On: 01/08/2022 10:35   EEG adult  Result Date: 01/06/2022 Lora Havens, MD     01/06/2022  9:43 AM Patient Name: PORSCHIA WILLBANKS MRN: [id number] Epilepsy Attending: Lora Havens Referring Physician/Provider: Frederik Pear, MD Date: 01/06/2022 Duration: 24.40 mins Patient history: 81 year old female with cardiac arrest.  EEG to evaluate for seizure. Level of alertness: lethargic AEDs during EEG study: None Technical aspects: This EEG study was done with scalp electrodes positioned according to the 10-20 International system of electrode placement. Electrical activity was reviewed with band pass filter of 1-'70Hz'$ , sensitivity of 7 uV/mm, display speed of 55m/sec with a '60Hz'$  notched filter applied as appropriate. EEG data were recorded continuously and digitally stored.  Video monitoring was available and reviewed as appropriate. Description: No clear posterior dominant rhythm was seen.  EEG showed continuous generalized 3 to 6 Hz theta-delta Slowing at times with triphasic morphology.  Hyperventilation and photic stimulation were not performed.   ABNORMALITY - Continuous slow, generalized IMPRESSION: This study is suggestive of moderate to severe diffuse encephalopathy, nonspecific etiology. No seizures or epileptiform discharges were seen throughout the recording. PLora Havens  DG CHEST PORT 1 VIEW  Result Date: 01/06/2022 CLINICAL DATA:  Patient removed chest tube. EXAM: PORTABLE CHEST 1 VIEW COMPARISON:  01/05/2022 FINDINGS: Interval removal of right basilar chest tube. No pneumothorax visualized. There is a ET tube with tip above the carina. Left IJ catheter tip is in the cavoatrial junction. Stable cardiomediastinal contours. Asymmetric elevation of the right hemidiaphragm. Opacity within the  right midlung and right base appears increased from the previous exam. Subsegmental atelectasis noted in the left base. IMPRESSION: 1. Interval removal of right chest tube. No pneumothorax visualized. 2. Worsening aeration to the right midlung and right base. Persistent left base atelectasis. Electronically Signed   By: TKerby MoorsM.D.   On: 01/06/2022 07:06   CT HEAD WO CONTRAST (5MM)  Result Date: 01/06/2022 CLINICAL  DATA:  Neuro deficit, acute, stroke suspected. EXAM: CT HEAD WITHOUT CONTRAST TECHNIQUE: Contiguous axial images were obtained from the base of the skull through the vertex without intravenous contrast. RADIATION DOSE REDUCTION: This exam was performed according to the departmental dose-optimization program which includes automated exposure control, adjustment of the mA and/or kV according to patient size and/or use of iterative reconstruction technique. COMPARISON:  None Available. FINDINGS: Brain: No acute intracranial hemorrhage, midline shift or mass effect. No extra-axial fluid collection. Generalized atrophy is noted. Periventricular white matter hypodensities are noted bilaterally. No hydrocephalus. Vascular: Atherosclerotic calcification of the carotid siphons. No hyperdense vessel. Skull: Normal. Negative for fracture or focal lesion. Sinuses/Orbits: There is opacification of the right maxillary sinus, bilateral sphenoid sinuses, and partial opacification of the ethmoid air cells. No acute orbital abnormality. Other: Endotracheal and enteric tubes are noted in the oral cavity. IMPRESSION: 1. No acute intracranial process. 2. Atrophy with chronic microvascular ischemic changes. Electronically Signed   By: Brett Fairy M.D.   On: 01/06/2022 04:08   DG Chest Port 1 View  Result Date: 01/05/2022 CLINICAL DATA:  Acute respiratory failure and ETT placement EXAM: PORTABLE CHEST 1 VIEW COMPARISON:  Radiographs 01/05/2022 at 5:37 a.m. FINDINGS: Stable cardiomediastinal silhouette. Unchanged  position of the endotracheal and enteric tubes. The right-sided chest tube has been repositioned with tip now in the right lung base. Left IJ CVC tip in the right atrium. Bibasilar atelectasis. Possible small bilateral pleural effusions. No acute osseous abnormality. IMPRESSION: Repositioning of the right chest tube. Otherwise stable lines and tubes. Bibasilar opacities favored to represent atelectasis. Electronically Signed   By: Placido Sou M.D.   On: 01/05/2022 22:45   DG CHEST PORT 1 VIEW  Result Date: 01/05/2022 CLINICAL DATA:  Respiratory distress. EXAM: PORTABLE CHEST 1 VIEW COMPARISON:  January 04, 2022. FINDINGS: Stable cardiomegaly. Endotracheal and nasogastric tubes are unchanged in position. Right-sided chest tube is noted without pneumothorax. Right basilar atelectasis is noted. Minimal left basilar subsegmental atelectasis is noted. Bony thorax is unremarkable. IMPRESSION: Stable support apparatus.  Bibasilar opacities as described above. Electronically Signed   By: Marijo Conception M.D.   On: 01/05/2022 08:24   DG CHEST PORT 1 VIEW  Result Date: 01/04/2022 CLINICAL DATA:  Respiratory distress EXAM: PORTABLE CHEST 1 VIEW COMPARISON:  Chest x-ray 01/04/2022.  Chest CT 01/03/2022. FINDINGS: Endotracheal tube tip is 3.5 cm above the carina. Left-sided central venous catheter tip projects over the distal SVC. New pigtail catheter overlies the right chest. The cardiomediastinal silhouette is within normal limits. Right pleural effusion has decreased. Small right pleural effusion persists. No evidence for pneumothorax or mediastinal shift. Patchy opacities in the lower lungs are present, right greater than left. Osseous structures are stable. IMPRESSION: 1. New right-sided pigtail catheter. 2. Right pleural effusion has significantly decreased. Small right pleural effusion persists. 3. Bibasilar atelectasis/airspace disease. Electronically Signed   By: Ronney Asters M.D.   On: 01/04/2022 15:56    ECHOCARDIOGRAM COMPLETE  Result Date: 01/04/2022    ECHOCARDIOGRAM REPORT   Patient Name:   [name] Date of Exam: 01/04/2022 Medical Rec #:  [id number]       Height:       60.0 in Accession #:    [id number]      Weight:       148.6 lb Date of Birth:  [birth year]       BSA:          1.645 m Patient Age:  80 years        BP:           120/68 mmHg Patient Gender: F               HR:           71 bpm. Exam Location:  Inpatient Procedure: 2D Echo, Cardiac Doppler, Color Doppler and Intracardiac            Opacification Agent STAT ECHO Indications:    Cardiac arrest I46.9  History:        Patient has prior history of Echocardiogram examinations, most                 recent 12/29/2020. Arrythmias:Atrial Fibrillation; Risk                 Factors:Hypertension and GERD.  Sonographer:    Bernadene Person RDCS Referring Phys: [id number] Candee Furbish  Sonographer Comments: Echo performed with patient supine and on artificial respirator. IMPRESSIONS  1. All mid-to-apical LV segments are akinetic. The basal LV segments demonstrate normal contractility. Findings most consistent with Takostubo cardiomyopathy . No LV thrombus visualized.  2. Left ventricular ejection fraction, by estimation, is 25 to 30%. The left ventricle has severely decreased function. The left ventricle demonstrates regional wall motion abnormalities (see scoring diagram/findings for description). Diastolic function  indeterminant due to Afib.  3. Right ventricular systolic function is mildly reduced. The right ventricular size is mildly enlarged. There is mildly elevated pulmonary artery systolic pressure. The estimated right ventricular systolic pressure is 38.1 mmHg.  4. Left atrial size was mildly dilated.  5. Right atrial size was moderately dilated.  6. The mitral valve is grossly normal. Moderate mitral valve regurgitation.  7. Tricuspid valve regurgitation is mild to moderate.  8. The aortic valve is tricuspid. There is mild calcification of  the aortic valve. There is mild thickening of the aortic valve. Aortic valve regurgitation is mild to moderate. Aortic valve sclerosis/calcification is present, without any evidence of aortic stenosis. Comparison(s): Compared to prior TTE, the LVEF has dropped to 25% (previously 55%). Mitral regurgitation now appears moderate (likely functional) which was previously trivial. FINDINGS  Left Ventricle: All mid-to-apical LV segments are akinetic. The basal LV segments demonstrate normal contractility. Findings most consistent with Takostubo cardiomyopathy. Left ventricular ejection fraction, by estimation, is 25 to 30%. The left ventricle has severely decreased function. The left ventricle demonstrates regional wall motion abnormalities. Definity contrast agent was given IV to delineate the left ventricular endocardial borders. The left ventricular internal cavity size was normal in size. There is no left ventricular hypertrophy. Diastolic function indeterminant due to Afib. Right Ventricle: The right ventricular size is mildly enlarged. No increase in right ventricular wall thickness. Right ventricular systolic function is mildly reduced. There is mildly elevated pulmonary artery systolic pressure. The tricuspid regurgitant  velocity is 3.03 m/s, and with an assumed right atrial pressure of 8 mmHg, the estimated right ventricular systolic pressure is 01.7 mmHg. Left Atrium: Left atrial size was mildly dilated. Right Atrium: Right atrial size was moderately dilated. Pericardium: Trivial pericardial effusion is present. The pericardial effusion is circumferential. Mitral Valve: The mitral valve is grossly normal. There is mild thickening of the mitral valve leaflet(s). There is mild calcification of the mitral valve leaflet(s). Moderate mitral valve regurgitation. Tricuspid Valve: The tricuspid valve is normal in structure. Tricuspid valve regurgitation is mild to moderate. Aortic Valve: The aortic valve is tricuspid.  There is mild calcification of the  aortic valve. There is mild thickening of the aortic valve. Aortic valve regurgitation is mild to moderate. Aortic regurgitation PHT measures 603 msec. Aortic valve sclerosis/calcification is present, without any evidence of aortic stenosis. Pulmonic Valve: The pulmonic valve was normal in structure. Pulmonic valve regurgitation is trivial. Aorta: The aortic root and ascending aorta are structurally normal, with no evidence of dilitation. Venous: IVC assessment for right atrial pressure unable to be performed due to mechanical ventilation. IAS/Shunts: The atrial septum is grossly normal.  LEFT VENTRICLE PLAX 2D LVIDd:         4.20 cm LVIDs:         3.10 cm LV PW:         1.10 cm LV IVS:        1.00 cm LVOT diam:     2.00 cm LV SV:         34 LV SV Index:   20 LVOT Area:     3.14 cm  LV Volumes (MOD) LV vol d, MOD A2C: 83.0 ml LV vol d, MOD A4C: 68.8 ml LV vol s, MOD A2C: 64.9 ml LV vol s, MOD A4C: 48.5 ml LV SV MOD A2C:     18.1 ml LV SV MOD A4C:     68.8 ml LV SV MOD BP:      19.4 ml RIGHT VENTRICLE RV S prime:     6.53 cm/s TAPSE (M-mode): 1.2 cm LEFT ATRIUM             Index        RIGHT ATRIUM           Index LA diam:        4.50 cm 2.74 cm/m   RA Area:     23.30 cm LA Vol (A2C):   55.5 ml 33.74 ml/m  RA Volume:   73.20 ml  44.49 ml/m LA Vol (A4C):   50.0 ml 30.39 ml/m LA Biplane Vol: 55.4 ml 33.67 ml/m  AORTIC VALVE LVOT Vmax:         51.80 cm/s LVOT Vmean:        34.000 cm/s LVOT VTI:          0.107 m AI PHT:            603 msec AR Vena Contracta: 0.20 cm  AORTA Ao Root diam: 2.80 cm Ao Asc diam:  3.50 cm MR Peak grad:    93.7 mmHg    TRICUSPID VALVE MR Mean grad:    63.0 mmHg    TR Peak grad:   36.7 mmHg MR Vmax:         484.00 cm/s  TR Vmax:        303.00 cm/s MR Vmean:        384.0 cm/s MR PISA:         1.01 cm     SHUNTS MR PISA Eff ROA: 8 mm        Systemic VTI:  0.11 m MR PISA Radius:  0.40 cm      Systemic Diam: 2.00 cm Gwyndolyn Kaufman MD Electronically  signed by Gwyndolyn Kaufman MD Signature Date/Time: 01/04/2022/10:54:03 AM    Final    DG Chest Port 1 View  Result Date: 01/04/2022 CLINICAL DATA:  [id number]; respiratory distress EXAM: PORTABLE CHEST 1 VIEW COMPARISON:  CT dated January 03, 2022; chest radiograph dated December 07, 2021 FINDINGS: The cardiomediastinal silhouette is unchanged and partially obscured in contour.ETT tip terminates 3.3 cm above the carina. Enteric  tube tip and side port project over the stomach. LEFT IJ CVC tip terminates over the superior cavoatrial junction. Moderate to large RIGHT-sided pleural effusion, similar in comparison to most recent prior. Small LEFT pleural effusion. Heterogeneously consolidative RIGHT basilar airspace opacity with minimally improved aeration in comparison to most recent prior chest radiograph. This is overall markedly worsened since January 01, 2022. No pneumothorax. IMPRESSION: 1.  Support apparatus as described above. 2. Moderate to large RIGHT-sided pleural effusion of the RIGHT basilar airspace opacity. Minimally improved aeration in comparison to most recent prior chest radiograph but overall markedly worsened since July 28th. Electronically Signed   By: Valentino Saxon M.D.   On: 01/04/2022 07:51   CT CHEST ABDOMEN PELVIS WO CONTRAST  Result Date: 01/03/2022 CLINICAL DATA:  Inpatient exam for sepsis. EXAM: CT CHEST, ABDOMEN AND PELVIS WITHOUT CONTRAST TECHNIQUE: Multidetector CT imaging of the chest, abdomen and pelvis was performed following the standard protocol without IV contrast. RADIATION DOSE REDUCTION: This exam was performed according to the departmental dose-optimization program which includes automated exposure control, adjustment of the mA and/or kV according to patient size and/or use of iterative reconstruction technique. COMPARISON:  Portable chest today, portable chest studies 01/02/2022 and 12/30/2021, CTA chest yesterday at 10:57 p.m., and abdomen and pelvis CT no contrast 07/28/2008.  FINDINGS: CT CHEST FINDINGS Cardiovascular: Moderate panchamber cardiomegaly with biatrial predominance is again noted. Again also noted is minimal air in the anterior aspect of the right atrium likely iatrogenic. There is a left IJ central line terminating at the superior cavoatrial junction. Minimal calcification noted proximal LAD coronary artery only, mild to moderate aortic atherosclerosis and tortuosity without aneurysm the pulmonary trunk upper limit of normal in caliber but unchanged. Central veins are mildly distended. Mediastinum/Nodes: There is a mildly prominent precarinal lymph node left of the midline again noted measuring 1.2 cm in short axis a similar sized subcarinal node. No further adenopathy is seen. Axillary spaces are clear. There are surgical clips in the right axilla. Enteric tube is in place with otherwise unremarkable esophagus. ETT abuts the right side of the distal trachea with the tip 2 cm from the carina. The main bronchi are clear. Thyroid is unremarkable. Lungs/Pleura: Right hemidiaphragm is elevated to the level of the mid hilum but this has been noted on multiple prior chest x-rays. There is increasing moderate-to-large right pleural effusion, majority layering posteriorly but some of it could be loculated at least partially in the lateral base. The effusion completely collapses the right lower lobe on today's study and there is atelectasis or consolidation, with air bronchograms, in the overlying right middle lobe. There is a band of consolidation anteriorly in the right upper lobe which was seen yesterday and could be band atelectasis or pneumonia. No subpleural interstitial edema seen. There is subpleural atelectasis again in the posterior basal left lower lobe, linear scarring or atelectasis in the lingular base. There is faint left perihilar haziness which may suspect ground-glass edema or minimal pneumonitis. Left lung is otherwise clear.  There is no pneumothorax.  Musculoskeletal: There is osteopenia, degenerative change of the thoracic spine and mild thoracic kyphodextroscoliosis. No acute or significant osseous findings are seen in the thorax. On the left there is a subpleural breast implant, prepectoral breast implant on the right which is probably an implant reconstruction postmastectomy. No acute chest wall abnormality is seen. CT ABDOMEN PELVIS FINDINGS Hepatobiliary: Interval development of capsular nodularity in the left lobe of the liver consistent with cirrhosis. The main portal vein  is normal caliber. No focal liver lesion is seen allowing for artifact from the patient's arms. Gallbladder is distended and contains contrast and a few tiny stones. There is faint pericholecystic edema. Pancreas: No focal abnormality without contrast. Spleen: No focal abnormality without contrast. Adrenals/Urinary Tract: There is no adrenal mass. No renal mass is seen. There is persistent contrast enhancement of the bilateral renal cortex following yesterday's CTA chest concerning for contrast nephrotoxicity. There is no contrast in the urinary collecting systems and bladder. There is asymmetric stranding around the right kidney. There is a 3 mm nonobstructive caliceal stone anteriorly in the upper pole of the right kidney. No other urinary stones or obstructing stones are seen. There is no hydroureteronephrosis The bladder is contracted around a Foley balloon and not well seen but it does not appear thickened. Vascular/Lymphatic: Moderate aortic patchy calcification without aneurysm. No adenopathy is seen. Stomach/Bowel: NGT loops around within the stomach to the left with tip directed cephalad in the proximal stomach. The gastric wall, unopacified small bowel and retrocecal appendix are unremarkable. There are no findings of acute colitis or diverticulitis, with left-sided diverticula most numerous in the sigmoid. Reproductive: Uterus and bilateral adnexa are unremarkable. Other:  There is moderate retroperitoneal stranding opacity beginning at the level of the renal veins, surrounding the bilateral renal veins, IVC and abdominal aorta continuing as far inferiorly as the aortic bifurcation, with scattered nonlocalizing fluid. For the most part this is below the level of the pancreas with only the most distal pancreatic tail abutting the process. Etiology indeterminate. No localizing fluid collection is seen. No free air is seen and no abscess or free hemorrhage is suspected. There is no incarcerated hernia. Trace ascites posterior deep pelvis and perihepatic space. There is also mild body wall anasarca. Musculoskeletal: There are degenerative changes of the lumbar spine. 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 IMPRESSION: 1. Interval intubation, NGT placement, left IJ central line. 2. Worsening right pleural effusion now completely collapsing the right lower lobe, with consolidation or atelectasis in the adjacent right middle lobe, and band consolidation or atelectasis again in the anterior right upper lobe. Follow-up study recommended to ensure clearing. 3. Panchamber cardiomegaly with biatrial predominance, slightly prominent central pulmonary veins, no subpleural edema seen but there is left perihilar haziness which could be ground-glass edema or pneumonitis. 4. Previous right mastectomy with implant reconstruction. No chest wall mass. 5. Cirrhotic liver changes developed since 2010 but no splenomegaly. Trace ascites. No portal vein dilatation. 6. Distended gallbladder with small stones, contrast and faint pericholecystic edema, findings could be due to congestive edema or cholecystitis. No biliary dilatation. 7. Persistent contrast enhancement of the renal cortex since yesterday's CTA chest. This is concerning for contrast nephrotoxicity. 8. Asymmetric right perinephric  stranding, which could be congestive or inflammatory. 9. Moderate retroperitoneal stranding and scattered nonlocalizing fluid extending from the renal veins to the aortic bifurcation. Only a small portion of this is in contact with the distal pancreas. Differential diagnosis includes congestive edema such as due to IVC and/or renal vein thrombosis, inflammatory/infectious process such as retroperitoneal fasciitis, and less likely pancreatitis. Close clinical follow-up is recommended and surgical consultation suggested. There is no soft tissue gas. Study is performed without contrast and patency of the IVC and renal veins is not known but IVC patency was seen last night. 10. Trace perihepatic and pelvic ascites. 11. Aortic and coronary artery atherosclerosis. 12.  These results will be called to the ordering clinician or representative by the radiology assistant, and communication documented in the PACS or [place] dashboard. Electronically Signed   By: Telford Nab M.D.   On: 01/03/2022 21:37   CT HEAD WO CONTRAST (5MM)  Result Date: 01/03/2022 CLINICAL DATA:  Altered mental status. EXAM: CT HEAD WITHOUT CONTRAST TECHNIQUE: Contiguous axial images were obtained from the base of the skull through the vertex without intravenous contrast. RADIATION DOSE REDUCTION: This exam was performed according to the departmental dose-optimization program which includes automated exposure control, adjustment of the mA and/or kV according to patient size and/or use of iterative reconstruction technique. COMPARISON:  Head CT dated 12/28/2020. FINDINGS: Brain: Mild age-related atrophy and chronic microvascular ischemic changes. There is no acute intracranial hemorrhage. No mass effect or midline shift. No extra-axial fluid collection. Vascular: No hyperdense vessel or unexpected calcification. Skull: Normal. Negative for fracture or focal lesion. Sinuses/Orbits: Complete opacification of the right maxillary sinus and multiple  ethmoid air cells. No air-fluid level. The mastoid air cells are clear. Other: None IMPRESSION: 1. No acute intracranial pathology. 2. Mild age-related atrophy and chronic microvascular ischemic changes. Electronically Signed   By: Anner Crete M.D.   On: 01/03/2022 20:56   DG Chest 1 View  Result Date: 01/03/2022 CLINICAL DATA:  Check endotracheal tube placement, respiratory distress EXAM: PORTABLE CHEST 1 VIEW COMPARISON:  Films from the previous day. FINDINGS: Cardiac shadow is stable. Increasing right-sided effusion and right basilar consolidation are seen. Left jugular central line is noted at the cavoatrial junction. No pneumothorax is seen. Endotracheal tube and gastric catheter are seen in satisfactory position. Mild left basilar atelectasis is seen. IMPRESSION: Increasing right-sided pleural effusion and basilar consolidation. Mild left basilar atelectasis. Tubes and lines in satisfactory position. Electronically Signed   By: Inez Catalina M.D.   On: 01/03/2022 19:05   CT Angio Chest Pulmonary Embolism (PE) W or WO Contrast  Result Date: 01/02/2022 CLINICAL DATA:  Chest pain and shortness of breath. EXAM: CT ANGIOGRAPHY CHEST WITH CONTRAST TECHNIQUE: Multidetector CT imaging of the chest was performed using the standard protocol during bolus administration of intravenous contrast. Multiplanar CT image reconstructions and MIPs were obtained to evaluate the vascular anatomy. RADIATION DOSE REDUCTION: This exam was performed according to the departmental dose-optimization program which includes automated exposure control, adjustment of the mA and/or kV according to patient size and/or use of iterative reconstruction technique. CONTRAST:  18m OMNIPAQUE IOHEXOL 350 MG/ML SOLN COMPARISON:  Chest radiograph dated 01/02/2022. FINDINGS: Cardiovascular: Mild cardiomegaly with biatrial enlargement. Retrograde flow of the contrast from the right atrium into the IVC in keeping with right heart dysfunction.  Correlation with echocardiogram recommended. No pericardial effusion. Coronary vascular calcification of the LAD. There is mild atherosclerotic calcification of the thoracic aorta. No aneurysmal dilatation. Evaluation of the pulmonary arteries is limited due to respiratory motion. No pulmonary artery embolus identified. Mediastinum/Nodes: No adenopathy in the left hilum. Mediastinal lymph node measures approximately 9 mm in short axis anterior to the carina. Evaluation of the right hilar lymph nodes is limited due to consolidative changes of the right lung. The esophagus is grossly unremarkable. No mediastinal fluid collection. Lungs/Pleura: There is a small right pleural effusion. Large areas of consolidation with air bronchogram involving the right lower and right middle lobes consistent with atelectasis or infiltrate. Underlying mass is not excluded. Clinical correlation and follow-up to resolution recommended. Minimal left lung base atelectasis. No pneumothorax. The central airways are patent. 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  Upper Abdomen: Cirrhosis. Musculoskeletal: Osteopenia degenerative changes of the spine. No acute osseous pathology. Bilateral breast implants. Review of the MIP images confirms the above findings. IMPRESSION: 1. No CT evidence of pulmonary artery embolus. 2. Cardiomegaly with biatrial enlargement and evidence of right heart dysfunction. Correlation with echocardiogram recommended. 3. Large areas of consolidation with air bronchogram involving the right lower and right middle lobes consistent with atelectasis or infiltrate. Underlying mass is not excluded. Clinical correlation and follow-up to resolution recommended. 4. Small right pleural effusion. 5. Tracheomalacia. 6. Aortic Atherosclerosis (ICD10-I70.0). Electronically Signed   By: Anner Crete M.D.   On: 01/02/2022 23:16   DG CHEST PORT 1 VIEW  Result Date: 01/02/2022 CLINICAL DATA:  Shortness of breath EXAM: PORTABLE CHEST 1 VIEW  COMPARISON:  Chest x-ray 12/26/2021 FINDINGS: There is a new moderate-sized right pleural effusion. There is new focal density in the right perihilar and right suprahilar region. Left lung is clear. No pneumothorax. Cardiomediastinal silhouette is grossly unchanged. Surgical clips overlie the right lower chest and right axilla similar to the prior study. No acute fractures are seen. IMPRESSION: 1. New moderate-sized right pleural effusion. 2. New right perihilar focal opacities. Findings may represent pneumonia, but underlying neoplastic process not excluded. Please correlate clinically. Follow-up chest CT recommended. Electronically Signed   By: Ronney Asters M.D.   On: 01/02/2022 17:10   DG Chest Portable 1 View  Result Date: 12/29/2021 CLINICAL DATA:  Shortness of breath EXAM: PORTABLE CHEST 1 VIEW COMPARISON:  Previous studies including the examination of 12/28/2020 FINDINGS: Transverse diameter of heart is within normal limits. There are no signs of pulmonary edema. There is patchy infiltrate in medial right mid and right lower lung fields. Right hemidiaphragm is elevated. There is minimal blunting of right lateral CP angle. There is no pneumothorax. IMPRESSION: There is patchy infiltrate in the medial right mid and right lower lung fields suggesting atelectasis/pneumonia. Possible small right pleural effusion. Electronically Signed   By: Elmer Picker M.D.   On: 12/27/2021 18:37    Labs:  CBC: Recent Labs    01/23/22 0530 01/24/22 0926 01/24/22 1040 02/04/2022 0530  WBC 21.0* 14.9* 17.5* 9.3  HGB 8.2* 6.3* 7.8* 7.0*  HCT 24.9* 19.7* 25.0* 22.8*  PLT 401* 297 328 365    COAGS: Recent Labs    01/03/22 2119 01/05/22 0000 01/30/2022 0524 01/14/22 0708 01/15/22 0529 01/15/22 2349  INR 3.8*  --   --   --   --   --   APTT 39*   < > 30 26 32 89*   < > = values in this interval not displayed.    BMP: Recent Labs    01/22/22 0513 01/23/22 0530 01/24/22 0432 01/19/2022 0530  NA  138 141 134* 141  K 3.8 4.6 3.4* 4.6  CL 98 99 101 109  CO2 35* 34* 26 27  GLUCOSE 106* 118* 105* 86  BUN 17 20 26* 27*  CALCIUM 9.3 9.5 8.2* 8.9  CREATININE 0.70 0.88 1.00 0.83  GFRNONAA >60 >60 58* >60    LIVER FUNCTION TESTS: Recent Labs    01/14/22 0708 01/16/22 0536 01/10/2022 0538 01/24/22 0432  BILITOT 1.2 1.1 1.1 1.2  AST 33 39 33 23  ALT '27 23 23 25  '$ ALKPHOS 80 73 86 64  PROT 5.3* 5.2* 5.6* 4.9*  ALBUMIN 2.3* 2.8* 2.6*  2.6* 1.8*    TUMOR MARKERS: No results for input(s): "AFPTM", "CEA", "CA199", "CHROMGRNA" in the last 8760 hours.  Assessment and  Plan: Intra-abdominal fluid collection Patient with prolonged hospitalization due to acute respiratory illness, cardiac arrest, bowel ischemia s/p ex lap with bowel resection 01/26/2022. CT Abdomen Pelvis performed yesterday due to changes in vital signs with abdominal distention. This showed extraluminal collection with concern for anastomotic leak.  IR consulted for drain placement.  Case approved by Dr. Annamaria Boots, however upon assessment this afternoon [name] appears acutely ill with respiratory distress, hypotension.  Patient unable to lie flat as she is requiring Bi-pap with accessory muscle use.  CCM and CCS notified of patient status and deferment of drain placement until clinical status more stable.  Currently NPO.  Xarelto on hold.   Thank you for this interesting consult.  I greatly enjoyed meeting [name] and look forward to participating in their care.  A copy of this report was sent to the requesting provider on this date.  Electronically Signed: Docia Barrier, PA 02/04/2022, 2:51 PM   I spent a total of 40 Minutes    in face to face in clinical consultation, greater than 50% of which was counseling/coordinating care for intra-abdominal fluid collection.

## 2022-01-25 NOTE — Procedures (Signed)
Intubation Procedure Note  MISHKA STEGEMANN  [id number]  05-20-1941  Date:01/12/2022  Time:4:04 PM   Provider Performing:Veronica Fretz    Procedure: Intubation (24497)  Indication(s) Respiratory Failure  Consent Unable to obtain consent due to emergent nature of procedure.   Anesthesia Etomidate, Versed, Fentanyl, and Rocuronium   Time Out Verified patient identification, verified procedure, site/side was marked, verified correct patient position, special equipment/implants available, medications/allergies/relevant history reviewed, required imaging and test results available.   Sterile Technique Usual hand hygeine, masks, and gloves were used   Procedure Description Patient positioned in bed supine.  Sedation given as noted above.  Patient was intubated with endotracheal tube using  direct laryngoscopy with MAC 3 blade .  View was Grade 1 full glottis .  Number of attempts was 1.  Colorimetric CO2 detector was consistent with tracheal placement.   Complications/Tolerance None; patient tolerated the procedure well. Chest X-ray is ordered to verify placement.   Specimen(s) None   Kipp Brood, MD [facility] ICU Physician [place]  Pager: [phone] Or Epic Secure Chat After hours: [phone].  02/01/2022, 4:05 PM

## 2022-01-25 NOTE — Progress Notes (Signed)
Inpatient Rehab Admissions Coordinator:    I continue to await insurance auth for CIR. Expedited appeal remains pending. I will continue to follow for potential admit pending auth.   Clemens Catholic, [place], [place] Admissions Coordinator  [phone] ([place]) [phone] (office)

## 2022-01-25 NOTE — Consult Note (Signed)
PCCM were called by Rapid Response to bedside on 4E 18 for patient who has rapidly decompensating. Sats were as low as 58% with good pleth and waveform. Upon arrival patient had pressure of 70 systolic and was mottled and blue, unresponsive. She had been placed on BiPAP by RT and rapid response who was at bedside and had called the [phone] pager. Peripheral levophed was started , will initial BP response of 91 systolic. She immediately became less mottled and  started to become more arousable. Sats continued to decompensate. She had rales on the right and was clear on the left.  Dr. Lynetta Mare made the decision to intubate the patient before transferring to ICU as she was so unstable and sats were rapidly dropping. She was initially given 1 amp of bicarb, then she was given fentanyl 50 mg, 1 Versed, 10 mg Etomidate, 50 of Rock, and intubated with a 7.5 ETT with no difficulty,  Mild thick secretions on cords. + Color Change post intubation and bilateral chest excursion with bilateral auscultation of breath sounds. BP dropped to 61 systolic after intubation and sedation. She was bagged  while transferring to the ICU. She was placed on the vent upon arrival to [place]. CXR confirms ETT placement.  She is currently stable with BP of 144/73, Sat of 100%, RR of 14 and HR 115.  ABG drawn prior to intubation was 7.23/ 60/ 168 We will repeat ABG in 1 hour. Dr. Lynetta Mare has updated patient's husband 8/21 at 15:35  Magdalen Spatz, MSN, AGACNP-BC [place] for personal pager PCCM on call pager [phone]  01/12/2022 3:35 PM

## 2022-01-25 NOTE — Progress Notes (Addendum)
Pt breathing seems to be worse struggling to breathe and shaking and BP dropping. Rapid response called and notified along with respiratory therapy . Pt was then placed on BIPAP and STAT ABG drawn and chest xray ordered by rapid response. Vital set on sequence and charted. Rapid and respiratory at bedside.    01/21/2022 1300  Vitals  Temp (!) 100.8 F (38.2 C)  Temp Source Axillary  BP (!) 98/49  MAP (mmHg) (!) 59  BP Location Right Leg  BP Method Automatic  Patient Position (if appropriate) Lying  Pulse Rate (!) 124  Pulse Rate Source Monitor  ECG Heart Rate (!) 115  Resp (!) 22  Level of Consciousness  Level of Consciousness Alert  Oxygen Therapy  SpO2 91 %  O2 Device Venturi Mask  O2 Flow Rate (L/min) 8 L/min  MEWS Score  MEWS Temp 1  MEWS Systolic 1  MEWS Pulse 2  MEWS RR 1  MEWS LOC 0  MEWS Score 5  MEWS Score Color Red

## 2022-01-25 NOTE — Progress Notes (Signed)
PROGRESS NOTE    [name]  XBD:[id number] DOB: 1940/07/28 DOA: 12/07/2021 PCP: [facility] [place]   Brief Narrative:  81 year old female admitted for community-acquired pneumonia and respiratory failure and concurrent cardiac arrest.  Patient had a parapneumonic effusion requiring chest tube.  Developed ischemic colitis was taken to the OR for a laparotomy with worsening shock/confusion. Patient was extubated on 01/14/2022.  7/28 admit, Ceftriaxone/Azithro 7/29 last dose xeralto  7/30 IHCA x 7 min, Ceftriaxone>Cefepime 7/31 Chest tube placed, heparin gtt started 8/1 patient remove chest tube.  Head CT obtained >negative.  Severely agitated overnight.  Vancomycin stopped.  Azithromycin stopped. 8/6 Transferred to [facility] [place] 8/7 Called CCM back for abd, Distention , hypotension and a fib with RVR, plan is for urgent OR 8/10 Abdomen closed yesterday, SBT/SAT, extubated 8/13 small BM/flatus 8/14 responded well to diuresis. Unable to complete thora as no evidence of pleural fluid but significant lung trapping noted per [facility] 8/15 overnight requiring BIPAP, improving and progressing.    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   Hypotension   A-fib (HCC)   Lactic acid increased   Cardiac arrest ([place])   Acute respiratory failure with hypoxia ([place])   Pneumonia of right lower lobe due to infectious organism   Takotsubo cardiomyopathy   Ischemic colitis ([place])   Septic shock ([place])   Pressure injury of skin   Septic shock Ischemic colitis with perforation Concurrent CAP with parapneumonic effusion Questionable concurrent cardiogenic shock Status post ex lap colectomy 01/20/2022 return on 01/29/2022 for abdominal closure Repeat CT abdomen/pelvis with right sided fluid collection, possible anastomotic leak versus abscess versus postop change Planned for possible IR  intervention on 8/21 On 01/24/2022, patient noted to have significantly increased work of breathing, lethargic, hypoxic, hypotensive PCCM was immediately consulted, patient was intubated and admitted to the ICU for further management  Recurrent sepsis likely 2/2 right-sided fluid collection, ?? Abscess Now with marked leukocytosis Chest x-ray with resolving pleural effusion, no new infiltrate opacity UA with large leukocytes, WBC 21-50 Urine culture with 10,000 E faecalis BC x2 NGTD CT abdomen/pelvis with right sided fluid collection, possible anastomotic leak versus abscess versus postop change Continue vancomycin, cefepime, Flagyl  Acute hypoxic respiratory failure due to right sided CAP with parapneumonic effusion, COPD without acute exacerbation  Extubated 01/14/2022, now reintubated on 01/24/2022 CXR shows R pleural effusion, PCCM called to assist given recent respiratory failure and high risk for respiratory compromise   SP PEA arrest 2/2 respiratory distress/ hypoxia Atrial fibrillation with RVR 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 EF 25-30% on 7/31 [place] Held diuretics, bisoprolol, ARB Switched to Xarelto Cardiomyopathy resolving, she has systolic recovery, EF 83%, without evidence of wall motion abnormalities  Moderate protein caloric malnutrition  Hypokalemia Hypomagnesemia Possible re-feeding syndrome Advance diet as tolerated  Macrocytic anemia ABLA post op, multifactorial Hgb trending downwards No obvious signs of bleeding Daily CBC, transfuse if hemoglobin less than 7  Left arm swelling/redness DVT Doppler negative       DVT prophylaxis: Xarelto Code Status: Full Family Communication: Husband at bedside  Status is: Inpatient  Dispo: The patient is from: Home              Anticipated d/c is to: To be determined              Anticipated d/c date is: TBD  Patient currently not medically stable for discharge  Consultants:  General  surgery, PCCM, cardiology  Antimicrobials:  Zosyn completed  01/28/2022 Micafungin discontinued   Subjective: Today, patient noted to have significantly increased work of breathing, lethargic, hypoxic, hypotensive. PCCM was immediately consulted, patient was intubated and admitted to the ICU for further management   Objective: Vitals:   01/05/2022 1414 01/17/2022 1415 01/28/2022 1502 01/28/2022 1646  BP:  (!) 82/48    Pulse: 85 81 (!) 105 98  Resp:   16 16  Temp:      TempSrc:      SpO2: 97% (!) 68% 100% 100%  Weight:      Height:    5' (1.524 m)    Intake/Output Summary (Last 24 hours) at 01/17/2022 1759 Last data filed at 01/28/2022 0350 Gross per 24 hour  Intake 580 ml  Output 350 ml  Net 230 ml   Filed Weights   01/23/22 0505 01/24/22 0350 01/10/2022 0548  Weight: 66 kg 66.1 kg 66.8 kg    Examination: General: Increased work of breathing, lethargic Cardiovascular: S1, S2 present Respiratory: Diminished breath sounds bilaterally Abdomen: Soft, distended, large abdominal bandage, C/D/I Musculoskeletal: Trace bilateral pedal edema noted Skin: Normal Psychiatry: Poor mood   Data Reviewed: I have personally reviewed following labs and imaging studies  CBC: Recent Labs  Lab 01/19/22 0645 01/20/22 0320 01/21/22 0513 01/23/22 0530 01/24/22 0926 01/24/22 1040 01/17/2022 0530 01/12/2022 1428  WBC 11.0* 14.0* 9.5 21.0* 14.9* 17.5* 9.3 9.1  NEUTROABS 8.8* 11.1* 7.1  --   --  14.7*  --  7.0  HGB 8.6* 8.3* 7.6* 8.2* 6.3* 7.8* 7.0* 6.9*  HCT 26.0* 26.0* 23.2* 24.9* 19.7* 25.0* 22.8* 23.1*  MCV 104.0* 106.6* 104.0* 105.5* 110.1* 110.6* 113.4* 113.8*  PLT 240 292 294 401* 297 328 365 093   Basic Metabolic Panel: Recent Labs  Lab 01/19/22 0645 01/20/22 0320 01/21/22 0513 01/22/22 0513 01/23/22 0530 01/24/22 0432 01/24/2022 0530 01/16/2022 1428  NA 141 139   < > 138 141 134* 141 139  K 3.7 3.5   < > 3.8 4.6 3.4* 4.6 4.9  CL 100 97*   < > 98 99 101 109 110  CO2 35* 33*   <  > 35* 34* '26 27 23  '$ GLUCOSE 102* 90   < > 106* 118* 105* 86 75  BUN 26* 21   < > 17 20 26* 27* 29*  CREATININE 0.62 0.55   < > 0.70 0.88 1.00 0.83 1.20*  CALCIUM 9.2 9.7   < > 9.3 9.5 8.2* 8.9 9.0  MG 1.8 1.8  --  1.5* 1.7  --   --   --   PHOS  --  2.7  --   --   --   --   --   --    < > = values in this interval not displayed.   GFR: Estimated Creatinine Clearance: 32.9 mL/min (A) (by C-G formula based on SCr of 1.2 mg/dL (H)). Liver Function Tests: Recent Labs  Lab 01/24/22 0432 01/17/2022 1428  AST 23 22  ALT 25 27  ALKPHOS 64 66  BILITOT 1.2 1.2  PROT 4.9* 5.8*  ALBUMIN 1.8* 2.1*   No results for input(s): "LIPASE", "AMYLASE" in the last 168 hours. No results for input(s): "AMMONIA" in the last 168 hours. Coagulation Profile: No results for input(s): "INR", "PROTIME" in the last 168 hours. Cardiac Enzymes: No results for input(s): "CKTOTAL", "CKMB", "CKMBINDEX", "TROPONINI" in the last 168  hours. BNP (last 3 results) No results for input(s): "PROBNP" in the last 8760 hours. HbA1C: No results for input(s): "HGBA1C" in the last 72 hours. CBG: Recent Labs  Lab 01/24/22 0744  GLUCAP 76   Lipid Profile: No results for input(s): "CHOL", "HDL", "LDLCALC", "TRIG", "CHOLHDL", "LDLDIRECT" in the last 72 hours.  Thyroid Function Tests: No results for input(s): "TSH", "T4TOTAL", "FREET4", "T3FREE", "THYROIDAB" in the last 72 hours. Anemia Panel: No results for input(s): "VITAMINB12", "FOLATE", "FERRITIN", "TIBC", "IRON", "RETICCTPCT" in the last 72 hours. Sepsis Labs: Recent Labs  Lab 01/10/2022 2255 01/23/22 1658 01/23/22 1730 01/24/22 0432 01/19/2022 0530 01/20/2022 1559  PROCALCITON  --   --  0.74 0.85 0.56  --   LATICACIDVEN 1.3 1.1  --   --   --  0.7    Recent Results (from the past 240 hour(s))  Culture, blood (Routine X 2) w Reflex to ID Panel     Status: None (Preliminary result)   Collection Time: 01/22/22  9:14 PM   Specimen: BLOOD LEFT HAND  Result Value Ref  Range Status   Specimen Description BLOOD LEFT HAND  Final   Special Requests   Final    BOTTLES DRAWN AEROBIC ONLY Blood Culture results may not be optimal due to an inadequate volume of blood received in culture bottles   Culture   Final    NO GROWTH 3 DAYS Performed at [facility], [place] [address], [place], [place] [address]    Report Status PENDING  Incomplete  Culture, blood (Routine X 2) w Reflex to ID Panel     Status: None (Preliminary result)   Collection Time: 01/22/22  9:21 PM   Specimen: BLOOD RIGHT HAND  Result Value Ref Range Status   Specimen Description BLOOD RIGHT HAND  Final   Special Requests   Final    BOTTLES DRAWN AEROBIC ONLY Blood Culture results may not be optimal due to an inadequate volume of blood received in culture bottles   Culture   Final    NO GROWTH 3 DAYS Performed at [facility], [place] [address], [place], [place] [address]    Report Status PENDING  Incomplete  Urine Culture     Status: Abnormal   Collection Time: 01/23/22 11:35 AM   Specimen: Urine, Clean Catch  Result Value Ref Range Status   Specimen Description URINE, CLEAN CATCH  Final   Special Requests   Final    NONE Performed at [facility], [address], [place], [place] [address]    Culture 10,000 COLONIES/mL ENTEROCOCCUS FAECALIS (A)  Final   Report Status 01/30/2022 FINAL  Final   Organism ID, Bacteria ENTEROCOCCUS FAECALIS (A)  Final      Susceptibility   Enterococcus faecalis - MIC*    AMPICILLIN <=2 SENSITIVE Sensitive     NITROFURANTOIN <=16 SENSITIVE Sensitive     VANCOMYCIN 1 SENSITIVE Sensitive     * 10,000 COLONIES/mL ENTEROCOCCUS FAECALIS         Radiology Studies: DG Chest Port 1 View  Result Date: 02/04/2022 CLINICAL DATA:  Intubation EXAM: PORTABLE CHEST 1 VIEW COMPARISON:  Portable exam 1526 hours compared to 01/22/2022 FINDINGS: Tip of endotracheal tube projects 1.9 cm above carina. Nasogastric tube extends into stomach. LEFT arm PICC  line tip projects over RIGHT atrium; recommend withdrawal 4 cm. Normal heart size, mediastinal contours, and pulmonary vascularity. Persistent RIGHT basilar opacity consistent with effusion and atelectasis versus infiltrate. LEFT basilar atelectasis, slightly increased. No  pneumothorax. IMPRESSION: Recommend withdrawal of RIGHT arm PICC line 4 cm. Persistent RIGHT pleural effusion and basilar atelectasis versus infiltrate with slightly increased LEFT basilar atelectasis. Electronically Signed   By: Lavonia Dana M.D.   On: 01/24/2022 15:35   CT ABDOMEN PELVIS W CONTRAST  Result Date: 01/24/2022 CLINICAL DATA:  Abdominal pain, acute, nonlocalized EXAM: CT ABDOMEN AND PELVIS WITH CONTRAST TECHNIQUE: Multidetector CT imaging of the abdomen and pelvis was performed using the standard protocol following bolus administration of intravenous contrast. RADIATION DOSE REDUCTION: This exam was performed according to the departmental dose-optimization program which includes automated exposure control, adjustment of the mA and/or kV according to patient size and/or use of iterative reconstruction technique. CONTRAST:  110m OMNIPAQUE IOHEXOL 300 MG/ML  SOLN COMPARISON:  01/10/2022 FINDINGS: Lower chest: Focal consolidation within the basilar right middle lobe is again seen, not fully assessed on this examination. Moderate right pleural effusion appears slightly enlarged since prior examination with compressive atelectasis of the right lower lobe. Cardiac size is mildly enlarged. Central venous catheter tip noted within the right atrium. Bilateral breast implants are noted. Surgical changes of right mastectomy are suspected. Hepatobiliary: No focal liver abnormality is seen. No gallstones, gallbladder wall thickening, or biliary dilatation. Pancreas: Unremarkable Spleen: Unremarkable Adrenals/Urinary Tract: The adrenal glands are unremarkable. The kidneys are normal. The bladder is largely decompressed. Stomach/Bowel: Mild  descending and sigmoid colonic diverticulosis without superimposed acute inflammatory change. Surgical changes of right hemicolectomy are identified. A extraluminal collection of gas and fluid is seen within the right mid abdomen anteriorly measuring 2.9 x 5.9 x 8.1 cm in greatest dimension, possibly representing an anastomotic leak given its close association with the anastomotic staple line, postoperative fluid collection, or abscess. There is extensive infiltrative change within the mesentery of the right mid abdomen, possibly postsurgical in nature or inflammatory given the adjacent fluid collection. Mild ascites is present. Stomach is unremarkable. Mild bowel wall thickening involving several loops of distal small bowel within the pelvis, again possibly postsurgical in nature. No evidence of obstruction. No free intraperitoneal gas. Vascular/Lymphatic: Aortic atherosclerosis. No enlarged abdominal or pelvic lymph nodes. Reproductive: Uterus and bilateral adnexa are unremarkable. Other: Mild diffuse subcutaneous body wall edema in keeping with anasarca. Superficial dehiscence of the midline laparotomy wound. The abdominal fascia appears intact. Musculoskeletal: Degenerative changes seen within the lumbar spine. No lytic or blastic bone lesion. IMPRESSION: 1. Surgical changes of right hemicolectomy. Extraluminal collection of gas and fluid within the right mid abdomen anteriorly measuring 2.9 x 5.9 x 8.1 cm in greatest dimension, possibly representing an anastomotic leak given its close association with the anastomotic staple line, postoperative fluid collection, or abscess. 2. Extensive infiltrative change within the mesentery of the right mid abdomen, possibly postsurgical in nature or inflammatory given the adjacent fluid collection. 3. Mild bowel wall thickening involving several loops of distal small bowel within the pelvis, again possibly postsurgical in nature. No evidence of obstruction. 4. Slight  interval enlargement of right pleural effusion, progressive body wall subcutaneous edema, and development of mild ascites in keeping with progressive anasarca. 5. Focal consolidation within the basilar right middle lobe again noted, not fully assessed on this examination. Aortic Atherosclerosis (ICD10-I70.0). Electronically Signed   By: AFidela SalisburyM.D.   On: 01/24/2022 01:33        Scheduled Meds:  sodium chloride   Intravenous Once   sodium chloride   Intravenous Once   sodium chloride   Intravenous Once   arformoterol  15 mcg Nebulization BID  budesonide (PULMICORT) nebulizer solution  0.25 mg Nebulization BID   docusate  100 mg Per Tube BID   feeding supplement  237 mL Oral BID BM   mupirocin ointment   Nasal BID   mouth rinse  15 mL Mouth Rinse Q2H   [START ON 01/26/2022] pantoprazole  40 mg Per Tube Daily   polyethylene glycol  17 g Per Tube Daily   revefenacin  175 mcg Nebulization Daily   Continuous Infusions:  sodium chloride Stopped (01/12/22 1757)   sodium chloride 250 mL (02/03/2022 2030)   sodium chloride 50 mL/hr at 01/17/2022 0643   sodium chloride     ceFEPime (MAXIPIME) IV 2 g (02/01/2022 1058)   dexmedetomidine (PRECEDEX) IV infusion 0.4 mcg/kg/hr (01/23/2022 1543)   metronidazole 500 mg (01/29/2022 1104)   norepinephrine (LEVOPHED) Adult infusion 15 mcg/min (01/26/2022 1529)   vancomycin 750 mg (01/26/2022 1336)    LOS: 24 days     Alma Friendly, MD [facility]  If 7PM-7AM, please contact night-coverage [email or web address]  01/29/2022, 5:59 PM

## 2022-01-25 NOTE — Progress Notes (Signed)
MD Daymon Larsen in regards to concerned of pts status.  "pt has wound on inside of nose looks to be a pressure injury".new order for  Bactroban salve . Also pt is very junkie when coughing no productive cough and decrease urine output.. I placed pt on O2 by mask to give her nose some rest. she is needing a higher level of o2 on 8L  and  saturation drops fast if o2 comes off notice pt starting to use accessory muscles to breathe and tremors noted  / MD stated will notified PCCM.  At this time pt BP 102/53(69) HR 107 RR 20 pt A/ox 4 able to answer questions but hard for her to speak.

## 2022-01-25 NOTE — Progress Notes (Signed)
   NAME:  [name], MRN:  [id number], DOB:  May 31, 1941, LOS: 24 ADMISSION DATE:  12/20/2021, CONSULTATION DATE:  01/15/2022 REFERRING MD:  Bonner Puna, CHIEF COMPLAINT:  Hypotension, abdominal distention    History of Present Illness:  81 yo female admited for CAP causing respiratory failure, had a cardiac arrest after admission and was moved to the ICU.  Chest tube placed for parapneumonic effusion, extubated on 8/2. Sent out of ICU on 8/5. 8/6 developed ischemic colitis initially treated conservatively but this morning we were called back 8/7 for worsening shock, confusion.  She is going to the OR today.   Pertinent  Medical History  Breast ca on letrozole Afib on [place] Hospital Events: Including procedures, antibiotic start and stop dates in addition to other pertinent events   7/28 admit, Ceftriaxone/Azithro 7/29 last dose xeralto  7/30 IHCA x 7 min, Ceftriaxone>Cefepime 7/31 Chest tube placed, heparin gtt started 8/1 patient remove chest tube.  Head CT obtained >negative.  Severely agitated overnight.  Vancomycin stopped.  Azithromycin stopped. 8/6 Transferred [facility] 8/7 Called CCM back for abd, Distention , hypotension and a fib with RVR, plan is for  urgent OR pending goals of care discussion then ICU post op if husband is in agreement. 8/10 Abdomen closed yesterday, SBT/SAT today 8/11 extubated yesterday, doing well on [place], up OOB, feeling alright 8/14 chest ultrasound shows atelectasis without significant right basilar effusion. 8/21 back to ICU for resp distress  Interim History / Subjective:  More tachypneic through day. Bps dropping.  Objective   Blood pressure (!) 82/48, pulse 81, temperature (!) 100.4 F (38 C), temperature source Axillary, resp. rate 20, height 5' (1.524 m), weight 66.8 kg, SpO2 (!) 68 %.        Intake/Output Summary (Last 24 hours) at 01/17/2022 1453 Last data filed at 02/04/2022 7858 Gross per 24 hour  Intake 580 ml  Output  350 ml  Net 230 ml    Filed Weights   01/23/22 0505 01/24/22 0350 01/30/2022 0548  Weight: 66 kg 66.1 kg 66.8 kg   Ill appearing woman tachypneic Ext with muscle wasting Abd soft, +TTP Lungs diminished R base +accessory muscle use More oriented than last time I saw her Husband at bedside  ABG acidemic Cr ok CXR large R effusion CT abd reviewed: leak vs. abscess  Resolved Hospital Problem list   Septic shock Hypoxic Respiratory Failure  Assessment & Plan:   Soon after my evaluation, rapid response called and 667 was called.  Defer further notes/plans to Eric Form and Dr. Lynetta Mare.  Erskine Emery MD PCCM

## 2022-01-25 NOTE — Progress Notes (Signed)
[facility] Progress Note  7 Days Post-Op  Subjective: CC:  Reports ongoing stable abd pain along with stable SOB. Has not walked since admission 7/28. Voiding w/ purewick and reports BMs.  Objective: Vital signs in last 24 hours: Temp:  [97.4 F (36.3 C)-99.3 F (37.4 C)] 99.3 F (37.4 C) (08/21 0800) Pulse Rate:  [89-100] 99 (08/21 0850) Resp:  [18-20] 19 (08/21 0850) BP: (94-118)/(44-61) 94/55 (08/21 0800) SpO2:  [96 %-100 %] 100 % (08/21 0850) Weight:  [66.8 kg] 66.8 kg (08/21 0548) Last BM Date : 01/22/22  Intake/Output from previous day: 08/20 0701 - 08/21 0700 In: 83 [P.O.:240; I.V.:580] Out: 700 [Urine:700] Intake/Output this shift: No intake/output data recorded.  PE: Gen:  Alert, ill appearing elderly female Card:  Regular rate and rhythm Pulm:  labored breathing on [place] Abd: Soft, distended, midline dressing just changed and is c/d/I, appropriately tender without peritonitis Skin: warm and dry Psych: A&Ox3   Lab Results:  Recent Labs    01/24/22 0926 01/24/22 1040  WBC 14.9* 17.5*  HGB 6.3* 7.8*  HCT 19.7* 25.0*  PLT 297 328   BMET Recent Labs    01/24/22 0432 02/03/2022 0530  NA 134* 141  K 3.4* 4.6  CL 101 109  CO2 26 27  GLUCOSE 105* 86  BUN 26* 27*  CREATININE 1.00 0.83  CALCIUM 8.2* 8.9   PT/INR No results for input(s): "LABPROT", "INR" in the last 72 hours. CMP     Component Value Date/Time   NA 141 01/08/2022 0530   NA 139 11/18/2021 1432   NA 134 (L) 04/25/2017 1216   K 4.6 01/26/2022 0530   K 4.3 04/25/2017 1216   CL 109 01/29/2022 0530   CO2 27 01/26/2022 0530   CO2 29 04/25/2017 1216   GLUCOSE 86 01/26/2022 0530   GLUCOSE 85 04/25/2017 1216   BUN 27 (H) 01/08/2022 0530   BUN 10 11/18/2021 1432   BUN 14.4 04/25/2017 1216   CREATININE 0.83 01/24/2022 0530   CREATININE 0.72 01/17/2018 1347   CREATININE 0.7 04/25/2017 1216   CALCIUM 8.9 01/28/2022 0530   CALCIUM 10.0 04/25/2017 1216   PROT 4.9 (L)  01/24/2022 0432   PROT 7.2 04/25/2017 1216   ALBUMIN 1.8 (L) 01/24/2022 0432   ALBUMIN 3.7 04/25/2017 1216   AST 23 01/24/2022 0432   AST 20 01/17/2018 1347   AST 21 04/25/2017 1216   ALT 25 01/24/2022 0432   ALT 10 01/17/2018 1347   ALT 10 04/25/2017 1216   ALKPHOS 64 01/24/2022 0432   ALKPHOS 75 04/25/2017 1216   BILITOT 1.2 01/24/2022 0432   BILITOT 0.5 01/17/2018 1347   BILITOT 0.65 04/25/2017 1216   GFRNONAA >60 01/14/2022 0530   GFRNONAA >60 01/17/2018 1347   GFRAA 90 04/24/2020 1235   GFRAA >60 01/17/2018 1347   Lipase     Component Value Date/Time   LIPASE 77 07/28/2008 1630       Studies/Results: CT ABDOMEN PELVIS W CONTRAST  Result Date: 01/24/2022 CLINICAL DATA:  Abdominal pain, acute, nonlocalized EXAM: CT ABDOMEN AND PELVIS WITH CONTRAST TECHNIQUE: Multidetector CT imaging of the abdomen and pelvis was performed using the standard protocol following bolus administration of intravenous contrast. RADIATION DOSE REDUCTION: This exam was performed according to the departmental dose-optimization program which includes automated exposure control, adjustment of the mA and/or kV according to patient size and/or use of iterative reconstruction technique. CONTRAST:  196m OMNIPAQUE IOHEXOL 300 MG/ML  SOLN COMPARISON:  01/10/2022 FINDINGS: Lower  chest: Focal consolidation within the basilar right middle lobe is again seen, not fully assessed on this examination. Moderate right pleural effusion appears slightly enlarged since prior examination with compressive atelectasis of the right lower lobe. Cardiac size is mildly enlarged. Central venous catheter tip noted within the right atrium. Bilateral breast implants are noted. Surgical changes of right mastectomy are suspected. Hepatobiliary: No focal liver abnormality is seen. No gallstones, gallbladder wall thickening, or biliary dilatation. Pancreas: Unremarkable Spleen: Unremarkable Adrenals/Urinary Tract: The adrenal glands are  unremarkable. The kidneys are normal. The bladder is largely decompressed. Stomach/Bowel: Mild descending and sigmoid colonic diverticulosis without superimposed acute inflammatory change. Surgical changes of right hemicolectomy are identified. A extraluminal collection of gas and fluid is seen within the right mid abdomen anteriorly measuring 2.9 x 5.9 x 8.1 cm in greatest dimension, possibly representing an anastomotic leak given its close association with the anastomotic staple line, postoperative fluid collection, or abscess. There is extensive infiltrative change within the mesentery of the right mid abdomen, possibly postsurgical in nature or inflammatory given the adjacent fluid collection. Mild ascites is present. Stomach is unremarkable. Mild bowel wall thickening involving several loops of distal small bowel within the pelvis, again possibly postsurgical in nature. No evidence of obstruction. No free intraperitoneal gas. Vascular/Lymphatic: Aortic atherosclerosis. No enlarged abdominal or pelvic lymph nodes. Reproductive: Uterus and bilateral adnexa are unremarkable. Other: Mild diffuse subcutaneous body wall edema in keeping with anasarca. Superficial dehiscence of the midline laparotomy wound. The abdominal fascia appears intact. Musculoskeletal: Degenerative changes seen within the lumbar spine. No lytic or blastic bone lesion. IMPRESSION: 1. Surgical changes of right hemicolectomy. Extraluminal collection of gas and fluid within the right mid abdomen anteriorly measuring 2.9 x 5.9 x 8.1 cm in greatest dimension, possibly representing an anastomotic leak given its close association with the anastomotic staple line, postoperative fluid collection, or abscess. 2. Extensive infiltrative change within the mesentery of the right mid abdomen, possibly postsurgical in nature or inflammatory given the adjacent fluid collection. 3. Mild bowel wall thickening involving several loops of distal small bowel within  the pelvis, again possibly postsurgical in nature. No evidence of obstruction. 4. Slight interval enlargement of right pleural effusion, progressive body wall subcutaneous edema, and development of mild ascites in keeping with progressive anasarca. 5. Focal consolidation within the basilar right middle lobe again noted, not fully assessed on this examination. Aortic Atherosclerosis (ICD10-I70.0). Electronically Signed   By: Fidela Salisbury M.D.   On: 01/24/2022 01:33    Anti-infectives: Anti-infectives (From admission, onward)    Start     Dose/Rate Route Frequency Ordered Stop   01/23/2022 1030  vancomycin (VANCOREADY) IVPB 750 mg/150 mL        750 mg 150 mL/hr over 60 Minutes Intravenous Every 24 hours 01/29/2022 0937     01/15/2022 1000  vancomycin (VANCOREADY) IVPB 1500 mg/300 mL  Status:  Discontinued        1,500 mg 150 mL/hr over 120 Minutes Intravenous Every 48 hours 01/23/22 0909 01/12/2022 0937   01/23/22 1030  vancomycin (VANCOREADY) IVPB 1250 mg/250 mL        1,250 mg 166.7 mL/hr over 90 Minutes Intravenous  Once 01/23/22 0907 01/23/22 1259   01/23/22 1000  metroNIDAZOLE (FLAGYL) IVPB 500 mg        500 mg 100 mL/hr over 60 Minutes Intravenous Every 12 hours 01/23/22 0839     01/23/22 1000  ceFEPIme (MAXIPIME) 2 g in sodium chloride 0.9 % 100 mL IVPB  2 g 200 mL/hr over 30 Minutes Intravenous Every 12 hours 01/23/22 0901     01/10/2022 1400  micafungin (MYCAMINE) 100 mg in sodium chloride 0.9 % 100 mL IVPB  Status:  Discontinued        100 mg 105 mL/hr over 1 Hours Intravenous Every 24 hours 01/09/2022 1302 01/15/22 1211   01/10/22 1515  piperacillin-tazobactam (ZOSYN) IVPB 3.375 g        3.375 g 12.5 mL/hr over 240 Minutes Intravenous Every 8 hours 01/10/22 1427 01/08/2022 2359   01/04/22 2000  vancomycin (VANCOCIN) IVPB 1000 mg/200 mL premix  Status:  Discontinued        1,000 mg 200 mL/hr over 60 Minutes Intravenous Every 24 hours 01/03/22 1910 01/05/22 1321   01/03/22 2200   ceFEPIme (MAXIPIME) 2 g in sodium chloride 0.9 % 100 mL IVPB        2 g 200 mL/hr over 30 Minutes Intravenous Every 12 hours 01/03/22 1910 01/08/22 2209   01/03/22 1930  vancomycin (VANCOREADY) IVPB 1250 mg/250 mL        1,250 mg 166.7 mL/hr over 90 Minutes Intravenous  Once 01/03/22 1910 01/03/22 2219   01/02/22 2100  azithromycin (ZITHROMAX) 500 mg in sodium chloride 0.9 % 250 mL IVPB        500 mg 250 mL/hr  Intravenous Every 24 hours 12/18/2021 2248 01/06/22 2149   01/02/22 2000  cefTRIAXone (ROCEPHIN) 2 g in sodium chloride 0.9 % 100 mL IVPB  Status:  Discontinued        2 g 200 mL/hr over 30 Minutes Intravenous Every 24 hours 12/22/2021 2248 01/03/22 1733   12/09/2021 2030  cefTRIAXone (ROCEPHIN) 1 g in sodium chloride 0.9 % 100 mL IVPB        1 g 200 mL/hr over 30 Minutes Intravenous  Once 12/07/2021 2025 12/26/2021 2141   12/20/2021 2030  azithromycin (ZITHROMAX) 500 mg in sodium chloride 0.9 % 250 mL IVPB        500 mg 250 mL/hr over 60 Minutes Intravenous  Once 12/22/2021 2025 12/24/2021 2312        Assessment/Plan S/p exploratory laparotomy,  R colectomy, abthera placement 01/20/2022 Dr. Rosendo Gros S/p exploratory laparotomy, intestinal anastomosis, abdominal closure 01/12/2022 Dr. Rosendo Gros  for ischemic bowel with perforation - afebrile this AM, soft BP of 94/55  - WBC  17.5 yesterday, today penidng  - CT scan 8/21 with right sided fluid collection, air - ?anastomotic leak vs abscess vs post op change; IR consult today for drainage; hold xarelto, last dose 8/20 PM. Discussed with Dr. Horris Latino who approves with holding [place]. - wet-to-dry dressing to midline BID  - mobilize/pulm toilet - therapies recommend CIR   FEN: NPO for now, IR consult ID: Zosyn (stop date 8/14), cefepime/flagyl 8/19 >> VTE: SCD's Foley: purewick Dispo: progressive   Below per TRH -- Septic shock due to above Cardiac arrest 7/30, 7 mins CPR Hx cardiomyopathy COPD Afib, permanent CAP     LOS: 24 days   I reviewed  nursing notes, hospitalist notes, last 24 h vitals and pain scores, last 48 h intake and output, last 24 h labs and trends, and last 24 h imaging results.  Obie Dredge, PA-C [facility] Please see Amion for pager number during day hours 7:00am-4:30pm

## 2022-01-25 NOTE — Progress Notes (Signed)
Verbal blood consent obtained over the phone from husband, Vibha Ferdig.

## 2022-01-25 NOTE — Significant Event (Signed)
Rapid Response Event Note   Reason for Call :  Resp distress  Initial Focused Assessment:  On arrival, pt sitting in bed with labored breathing. HR 111, BP 81/25, RR 40, spO2 95% on 8L venturi mask. Skin is warm/dry, BLE mottled and purple. Pt opens eyes to voice and nods appropriately. Lungs diminished in bases with rhonchi/crackles to upper lobes. Abd distended with midline dressing in place.   Interventions:  Abg- 7.23/60/168/24.4 Bipap placed by RT CXR Levo gtt started Amp HCO3  1442: 1mg Fent, '1mg'$  versed 1445 50 Roc, 10 mg Etomidate Intubated with 7.5 ETT at [place]:  Pt transferred to 2H09  Event Summary:   MD Notified: CCM at [place] Time: [place] NSherilyn Dacosta RN

## 2022-01-26 ENCOUNTER — Inpatient Hospital Stay (HOSPITAL_COMMUNITY): Payer: Medicare Other

## 2022-01-26 DIAGNOSIS — Z515 Encounter for palliative care: Secondary | ICD-10-CM

## 2022-01-26 DIAGNOSIS — K559 Vascular disorder of intestine, unspecified: Secondary | ICD-10-CM | POA: Diagnosis not present

## 2022-01-26 DIAGNOSIS — J189 Pneumonia, unspecified organism: Secondary | ICD-10-CM | POA: Diagnosis not present

## 2022-01-26 DIAGNOSIS — Z7189 Other specified counseling: Secondary | ICD-10-CM

## 2022-01-26 DIAGNOSIS — J9601 Acute respiratory failure with hypoxia: Secondary | ICD-10-CM | POA: Diagnosis not present

## 2022-01-26 DIAGNOSIS — I469 Cardiac arrest, cause unspecified: Secondary | ICD-10-CM | POA: Diagnosis not present

## 2022-01-26 LAB — RETICULOCYTES
Immature Retic Fract: 19.4 % — ABNORMAL HIGH (ref 2.3–15.9)
RBC.: 2.29 MIL/uL — ABNORMAL LOW (ref 3.87–5.11)
Retic Count, Absolute: 74 10*3/uL (ref 19.0–186.0)
Retic Ct Pct: 3.2 % — ABNORMAL HIGH (ref 0.4–3.1)

## 2022-01-26 LAB — IRON AND TIBC
Iron: 21 ug/dL — ABNORMAL LOW (ref 28–170)
Saturation Ratios: 14 % (ref 10.4–31.8)
TIBC: 155 ug/dL — ABNORMAL LOW (ref 250–450)
UIBC: 134 ug/dL

## 2022-01-26 LAB — CBC
HCT: 22.5 % — ABNORMAL LOW (ref 36.0–46.0)
Hemoglobin: 7.1 g/dL — ABNORMAL LOW (ref 12.0–15.0)
MCH: 32.9 pg (ref 26.0–34.0)
MCHC: 31.6 g/dL (ref 30.0–36.0)
MCV: 104.2 fL — ABNORMAL HIGH (ref 80.0–100.0)
Platelets: 271 10*3/uL (ref 150–400)
RBC: 2.16 MIL/uL — ABNORMAL LOW (ref 3.87–5.11)
RDW: 21.2 % — ABNORMAL HIGH (ref 11.5–15.5)
WBC: 5.9 10*3/uL (ref 4.0–10.5)
nRBC: 0 % (ref 0.0–0.2)

## 2022-01-26 LAB — COMPREHENSIVE METABOLIC PANEL
ALT: 21 U/L (ref 0–44)
AST: 17 U/L (ref 15–41)
Albumin: <1.5 g/dL — ABNORMAL LOW (ref 3.5–5.0)
Alkaline Phosphatase: 49 U/L (ref 38–126)
Anion gap: 4 — ABNORMAL LOW (ref 5–15)
BUN: 24 mg/dL — ABNORMAL HIGH (ref 8–23)
CO2: 20 mmol/L — ABNORMAL LOW (ref 22–32)
Calcium: 7.1 mg/dL — ABNORMAL LOW (ref 8.9–10.3)
Chloride: 118 mmol/L — ABNORMAL HIGH (ref 98–111)
Creatinine, Ser: 0.86 mg/dL (ref 0.44–1.00)
GFR, Estimated: >60 mL/min (ref >60)
Glucose, Bld: 127 mg/dL — ABNORMAL HIGH (ref 70–99)
Potassium: 3.1 mmol/L — ABNORMAL LOW (ref 3.5–5.1)
Sodium: 142 mmol/L (ref 135–145)
Total Bilirubin: 0.8 mg/dL (ref 0.3–1.2)
Total Protein: 4.3 g/dL — ABNORMAL LOW (ref 6.5–8.1)

## 2022-01-26 LAB — TYPE AND SCREEN
ABO/RH(D): O POS
Antibody Screen: NEGATIVE
Unit division: 0

## 2022-01-26 LAB — BPAM RBC
Blood Product Expiration Date: 202309272359
ISSUE DATE / TIME: 202308211837
Unit Type and Rh: 5100

## 2022-01-26 LAB — GLUCOSE, CAPILLARY
Glucose-Capillary: 108 mg/dL — ABNORMAL HIGH (ref 70–99)
Glucose-Capillary: 118 mg/dL — ABNORMAL HIGH (ref 70–99)
Glucose-Capillary: 130 mg/dL — ABNORMAL HIGH (ref 70–99)
Glucose-Capillary: 85 mg/dL (ref 70–99)
Glucose-Capillary: 89 mg/dL (ref 70–99)
Glucose-Capillary: 91 mg/dL (ref 70–99)

## 2022-01-26 LAB — POCT I-STAT 7, (LYTES, BLD GAS, ICA,H+H)
Acid-Base Excess: 0 mmol/L (ref 0.0–2.0)
Bicarbonate: 27.5 mmol/L (ref 20.0–28.0)
Calcium, Ion: 1.32 mmol/L (ref 1.15–1.40)
HCT: 21 % — ABNORMAL LOW (ref 36.0–46.0)
Hemoglobin: 7.1 g/dL — ABNORMAL LOW (ref 12.0–15.0)
O2 Saturation: 100 %
Patient temperature: 38.1
Potassium: 4.4 mmol/L (ref 3.5–5.1)
Sodium: 142 mmol/L (ref 135–145)
TCO2: 29 mmol/L (ref 22–32)
pCO2 arterial: 65.1 mmHg (ref 32–48)
pH, Arterial: 7.239 — ABNORMAL LOW (ref 7.35–7.45)
pO2, Arterial: 368 mmHg — ABNORMAL HIGH (ref 83–108)

## 2022-01-26 LAB — FERRITIN: Ferritin: 702 ng/mL — ABNORMAL HIGH (ref 11–307)

## 2022-01-26 LAB — MAGNESIUM
Magnesium: 1.2 mg/dL — ABNORMAL LOW (ref 1.7–2.4)
Magnesium: 2.4 mg/dL (ref 1.7–2.4)

## 2022-01-26 LAB — VITAMIN B12: Vitamin B-12: 945 pg/mL — ABNORMAL HIGH (ref 180–914)

## 2022-01-26 LAB — FOLATE: Folate: 20.1 ng/mL (ref >5.9)

## 2022-01-26 LAB — LACTIC ACID, PLASMA: Lactic Acid, Venous: 1 mmol/L (ref 0.5–1.9)

## 2022-01-26 LAB — PHOSPHORUS: Phosphorus: 2.6 mg/dL (ref 2.5–4.6)

## 2022-01-26 LAB — PROCALCITONIN: Procalcitonin: 0.33 ng/mL

## 2022-01-26 MED ORDER — LACTATED RINGERS IV SOLN: INTRAVENOUS | Status: AC

## 2022-01-26 MED ORDER — POTASSIUM CHLORIDE 10 MEQ/50ML IV SOLN
10.0000 meq | INTRAVENOUS | Status: AC
  Administered 2022-01-26 (×6): 10 meq via INTRAVENOUS
  Filled 2022-01-26 (×5): qty 50

## 2022-01-26 MED ORDER — FENTANYL CITRATE (PF) 100 MCG/2ML IJ SOLN
INTRAMUSCULAR | Status: AC
  Filled 2022-01-26: qty 2

## 2022-01-26 MED ORDER — MIDAZOLAM HCL 2 MG/2ML IJ SOLN
INTRAMUSCULAR | Status: AC | PRN
  Administered 2022-01-26: 1 mg via INTRAVENOUS

## 2022-01-26 MED ORDER — ORAL CARE MOUTH RINSE
15.0000 mL | OROMUCOSAL | Status: DC
  Administered 2022-01-26 – 2022-01-29 (×29): 15 mL via OROMUCOSAL

## 2022-01-26 MED ORDER — SODIUM CHLORIDE 0.9% FLUSH
5.0000 mL | Freq: Three times a day (TID) | INTRAVENOUS | Status: DC
  Administered 2022-01-26 – 2022-01-28 (×6): 5 mL

## 2022-01-26 MED ORDER — ORAL CARE MOUTH RINSE
15.0000 mL | OROMUCOSAL | Status: DC | PRN
Start: 2022-01-26 — End: 2022-01-29

## 2022-01-26 MED ORDER — MIDAZOLAM HCL 2 MG/2ML IJ SOLN
INTRAMUSCULAR | Status: AC
  Filled 2022-01-26: qty 2

## 2022-01-26 MED ORDER — LIDOCAINE HCL 2 % IJ SOLN
10.0000 mL | Freq: Once | INTRAMUSCULAR | Status: DC
  Filled 2022-01-26: qty 10

## 2022-01-26 MED ORDER — MAGNESIUM SULFATE 2 GM/50ML IV SOLN
2.0000 g | Freq: Once | INTRAVENOUS | Status: AC
  Administered 2022-01-26: 2 g via INTRAVENOUS
  Filled 2022-01-26: qty 50

## 2022-01-26 MED ORDER — PIVOT 1.5 CAL PO LIQD
1000.0000 mL | ORAL | Status: DC
Start: 2022-01-26 — End: 2022-01-28

## 2022-01-26 MED ORDER — GUAIFENESIN 100 MG/5ML PO LIQD
10.0000 mL | ORAL | Status: DC | PRN
Start: 2022-01-26 — End: 2022-02-11
  Administered 2022-02-07 – 2022-02-10 (×3): 10 mL
  Filled 2022-01-26 (×3): qty 10

## 2022-01-26 MED ORDER — OXYCODONE HCL 5 MG PO TABS
5.0000 mg | ORAL_TABLET | Freq: Four times a day (QID) | ORAL | Status: DC | PRN
  Administered 2022-01-27: 10 mg
  Filled 2022-01-26: qty 2

## 2022-01-26 MED ORDER — MAGNESIUM SULFATE 4 GM/100ML IV SOLN
4.0000 g | Freq: Once | INTRAVENOUS | Status: AC
  Administered 2022-01-26: 4 g via INTRAVENOUS
  Filled 2022-01-26: qty 100

## 2022-01-26 MED ORDER — BACITRACIN ZINC 500 UNIT/GM EX OINT
TOPICAL_OINTMENT | Freq: Two times a day (BID) | CUTANEOUS | Status: AC
Start: 2022-01-26 — End: 2022-02-04
  Administered 2022-01-27 – 2022-02-04 (×2): 1 via TOPICAL
  Filled 2022-01-26: qty 28.4

## 2022-01-26 MED ORDER — FENTANYL CITRATE (PF) 100 MCG/2ML IJ SOLN
INTRAMUSCULAR | Status: AC | PRN
  Administered 2022-01-26: 25 ug via INTRAVENOUS

## 2022-01-26 MED ORDER — PIPERACILLIN-TAZOBACTAM 3.375 G IVPB
3.3750 g | Freq: Three times a day (TID) | INTRAVENOUS | Status: AC
  Administered 2022-01-26 – 2022-02-01 (×20): 3.375 g via INTRAVENOUS
  Filled 2022-01-26 (×19): qty 50

## 2022-01-26 NOTE — Progress Notes (Signed)
SLP Cancellation Note  Patient Details Name: [name] MRN: [id number] DOB: 1940-11-09   Cancelled treatment:       Reason Eval/Treat Not Completed: Medical issues which prohibited therapy. Pt with medical decline and now intubated. Will f/u as able.     Osie Bond., M.A. [place] Office [phone]  Secure chat preferred  01/26/2022, 8:09 AM

## 2022-01-26 NOTE — Progress Notes (Signed)
Nutrition Follow-up  DOCUMENTATION CODES:   Not applicable  INTERVENTION:   Tube Feeding via OG:  Pivot 1.5 at 20 ml/hr Goal TF regimen: Pivot 1.5 at 50 ml/hr Goal provides 113 g of protein, 1800 kcals and 912 mL of free water  NUTRITION DIAGNOSIS:   Inadequate oral intake related to acute illness as evidenced by NPO status.  Being addressed via TF  GOAL:   Patient will meet greater than or equal to 90% of their needs  Progressing  MONITOR:   Vent status, Labs, TF tolerance, Weight trends, Skin  REASON FOR ASSESSMENT:   Consult, Ventilator Enteral/tube feeding initiation and management  ASSESSMENT:   81 yo female admitted with acute respiratory failure related to COPD exacerbation and pneumonia, went into PEA arrest and required intubation. PMH includes A.fib, breast cancer, HTN, GERD, asthma, melanoma  7/28 Admit 7/30 IHCA x 7 minutes, Intubated 7/31 Chest tube placed, TF initiatede 8/01 Pt pulled out chest tube, CT head negative 8/02 Extubated 8/04 Cortrak placed, Cortrak pulled out by pt and replaced again, TF started 8/06 Abd pain/Abd fullness; KUB suggestive of ileus, TF on hold, Surgery consulted, CT abd with ischemic colitis, NG tube to LIS, Cortrak removed 8/07 Febrile, worsening abd pain, more somnolent, OR for ex lap and colon resection due to ischemic colon with contained perf, feculent peritonitis, abthera wound VAC placed, intubated 8/08 TPN consulted placed 8/09 Return to OR for ex lap, intestinal anastomosis, abd closure, TPN initiated 8/10 Extubated, TPN increased to goal rate 8/15 TPN weaned off  8/21 CT with right sided fluid collection, air-?anastomotic leak vs abscess vs post op change, IR consulted for drainage, Intubated  Pt remains on vent support, levophed down to 1 mcg/min.  Surgery indicating ok for trickle TF today, Plan for IR drainage of abscess today to better characterize what type of fluid (bowel vs anastomotic leak)  Noted  anemia panel pending  Pt with significant edema on exam; weeping per RN  Current wt 72.5 kg; admit weight 63.8 kg  Midline abdominal wound with pink viable tissue and intact fascia  Labs: potassium 3.1 (L), Creatinine wdl, BUN 24, magnesium 1.2 (L), no phosphorus  Meds: reviewed; KCL and mag sulfate   Diet Order:   Diet Order             Diet NPO time specified  Diet effective now                   EDUCATION NEEDS:   Not appropriate for education at this time  Skin:  Skin Assessment: Skin Integrity Issues: Skin Integrity Issues:: Other (Comment) Wound Vac: removed Incisions: dehisced abdominal wound Other: partial thickness wound to nares of nose  Last BM:  8/22 large type 7 (prior to TF initiation)  Height:   Ht Readings from Last 1 Encounters:  01/12/2022 5' (1.524 m)    Weight:   Wt Readings from Last 1 Encounters:  01/26/22 72.5 kg   BMI:  Body mass index is 31.22 kg/m.  Estimated Nutritional Needs:   Kcal:  1700-1900 kcals  Protein:  95-115 g  Fluid:  >/= 1.5 L    Kerman Passey MS, RDN, LDN, CNSC Registered Dietitian 3 Clinical Nutrition RD Pager and On-Call Pager Number Located in [place]

## 2022-01-26 NOTE — Progress Notes (Signed)
[facility] Progress Note  8 Days Post-Op  Subjective: CC:  Intubated yesterday due to worsening respiratory failure.  Sedated. On 1 mcg levo.  Had a large non-bloody stool this AM.  Objective: Vital signs in last 24 hours: Temp:  [96.3 F (35.7 C)-100.9 F (38.3 C)] 96.3 F (35.7 C) (08/22 0400) Pulse Rate:  [50-124] 84 (08/22 0630) Resp:  [0-32] 21 (08/22 0630) BP: (70-170)/(25-106) 100/65 (08/22 0630) SpO2:  [68 %-100 %] 100 % (08/22 0630) FiO2 (%):  [40 %-100 %] 40 % (08/22 0400) Weight:  [72.5 kg] 72.5 kg (08/22 0500) Last BM Date : 01/24/22  Intake/Output from previous day: 08/21 0701 - 08/22 0700 In: 3357.2 [I.V.:1696.7; Blood:464; IV Piggyback:1196.5] Out: 771 [Urine:770; Stool:1] Intake/Output this shift: No intake/output data recorded.  PE: Gen:  Alert, ill appearing elderly female Card:  Regular rate and rhythm Pulm:  intubated  Abd: Soft, minimal distention, midline wound with pink viable tissue and in-tact fascia as below    Skin: warm and dry Psych: unable to assess   Lab Results:  Recent Labs    02/03/2022 1428 01/12/2022 1701 01/26/22 0000  WBC 9.1  --  5.9  HGB 6.9* 7.1* 7.1*  HCT 23.1* 21.0* 22.5*  PLT 379  --  271   BMET Recent Labs    01/19/2022 1428 01/16/2022 1701 01/26/22 0000  NA 139 142 142  K 4.9 4.4 3.1*  CL 110  --  118*  CO2 23  --  20*  GLUCOSE 75  --  127*  BUN 29*  --  24*  CREATININE 1.20*  --  0.86  CALCIUM 9.0  --  7.1*   PT/INR No results for input(s): "LABPROT", "INR" in the last 72 hours. CMP     Component Value Date/Time   NA 142 01/26/2022 0000   NA 139 11/18/2021 1432   NA 134 (L) 04/25/2017 1216   K 3.1 (L) 01/26/2022 0000   K 4.3 04/25/2017 1216   CL 118 (H) 01/26/2022 0000   CO2 20 (L) 01/26/2022 0000   CO2 29 04/25/2017 1216   GLUCOSE 127 (H) 01/26/2022 0000   GLUCOSE 85 04/25/2017 1216   BUN 24 (H) 01/26/2022 0000   BUN 10 11/18/2021 1432   BUN 14.4 04/25/2017 1216   CREATININE 0.86  01/26/2022 0000   CREATININE 0.72 01/17/2018 1347   CREATININE 0.7 04/25/2017 1216   CALCIUM 7.1 (L) 01/26/2022 0000   CALCIUM 10.0 04/25/2017 1216   PROT 4.3 (L) 01/26/2022 0000   PROT 7.2 04/25/2017 1216   ALBUMIN <1.5 (L) 01/26/2022 0000   ALBUMIN 3.7 04/25/2017 1216   AST 17 01/26/2022 0000   AST 20 01/17/2018 1347   AST 21 04/25/2017 1216   ALT 21 01/26/2022 0000   ALT 10 01/17/2018 1347   ALT 10 04/25/2017 1216   ALKPHOS 49 01/26/2022 0000   ALKPHOS 75 04/25/2017 1216   BILITOT 0.8 01/26/2022 0000   BILITOT 0.5 01/17/2018 1347   BILITOT 0.65 04/25/2017 1216   GFRNONAA >60 01/26/2022 0000   GFRNONAA >60 01/17/2018 1347   GFRAA 90 04/24/2020 1235   GFRAA >60 01/17/2018 1347   Lipase     Component Value Date/Time   LIPASE 77 07/28/2008 1630       Studies/Results: DG Chest Port 1 View  Result Date: 01/07/2022 CLINICAL DATA:  Intubation EXAM: PORTABLE CHEST 1 VIEW COMPARISON:  Portable exam 1526 hours compared to 01/22/2022 FINDINGS: Tip of endotracheal tube projects 1.9 cm above carina. Nasogastric  tube extends into stomach. LEFT arm PICC line tip projects over RIGHT atrium; recommend withdrawal 4 cm. Normal heart size, mediastinal contours, and pulmonary vascularity. Persistent RIGHT basilar opacity consistent with effusion and atelectasis versus infiltrate. LEFT basilar atelectasis, slightly increased. No pneumothorax. IMPRESSION: Recommend withdrawal of RIGHT arm PICC line 4 cm. Persistent RIGHT pleural effusion and basilar atelectasis versus infiltrate with slightly increased LEFT basilar atelectasis. Electronically Signed   By: Lavonia Dana M.D.   On: 01/21/2022 15:35    Anti-infectives: Anti-infectives (From admission, onward)    Start     Dose/Rate Route Frequency Ordered Stop   01/10/2022 1030  vancomycin (VANCOREADY) IVPB 750 mg/150 mL        750 mg 150 mL/hr over 60 Minutes Intravenous Every 24 hours 01/22/2022 0937     01/29/2022 1000  vancomycin (VANCOREADY) IVPB  1500 mg/300 mL  Status:  Discontinued        1,500 mg 150 mL/hr over 120 Minutes Intravenous Every 48 hours 01/23/22 0909 01/14/2022 0937   01/23/22 1030  vancomycin (VANCOREADY) IVPB 1250 mg/250 mL        1,250 mg 166.7 mL/hr over 90 Minutes Intravenous  Once 01/23/22 0907 01/23/22 1259   01/23/22 1000  metroNIDAZOLE (FLAGYL) IVPB 500 mg        500 mg 100 mL/hr over 60 Minutes Intravenous Every 12 hours 01/23/22 0839     01/23/22 1000  ceFEPIme (MAXIPIME) 2 g in sodium chloride 0.9 % 100 mL IVPB        2 g 200 mL/hr over 30 Minutes Intravenous Every 12 hours 01/23/22 0901     01/12/2022 1400  micafungin (MYCAMINE) 100 mg in sodium chloride 0.9 % 100 mL IVPB  Status:  Discontinued        100 mg 105 mL/hr over 1 Hours Intravenous Every 24 hours 01/15/2022 1302 01/15/22 1211   01/10/22 1515  piperacillin-tazobactam (ZOSYN) IVPB 3.375 g        3.375 g 12.5 mL/hr over 240 Minutes Intravenous Every 8 hours 01/10/22 1427 01/17/2022 2359   01/04/22 2000  vancomycin (VANCOCIN) IVPB 1000 mg/200 mL premix  Status:  Discontinued        1,000 mg 200 mL/hr over 60 Minutes Intravenous Every 24 hours 01/03/22 1910 01/05/22 1321   01/03/22 2200  ceFEPIme (MAXIPIME) 2 g in sodium chloride 0.9 % 100 mL IVPB        2 g 200 mL/hr over 30 Minutes Intravenous Every 12 hours 01/03/22 1910 01/08/22 2209   01/03/22 1930  vancomycin (VANCOREADY) IVPB 1250 mg/250 mL        1,250 mg 166.7 mL/hr over 90 Minutes Intravenous  Once 01/03/22 1910 01/03/22 2219   01/02/22 2100  azithromycin (ZITHROMAX) 500 mg in sodium chloride 0.9 % 250 mL IVPB        500 mg 250 mL/hr  Intravenous Every 24 hours 12/30/2021 2248 01/06/22 2149   01/02/22 2000  cefTRIAXone (ROCEPHIN) 2 g in sodium chloride 0.9 % 100 mL IVPB  Status:  Discontinued        2 g 200 mL/hr over 30 Minutes Intravenous Every 24 hours 12/12/2021 2248 01/03/22 1733   12/31/2021 2030  cefTRIAXone (ROCEPHIN) 1 g in sodium chloride 0.9 % 100 mL IVPB        1 g 200 mL/hr over  30 Minutes Intravenous  Once 12/31/2021 2025 12/14/2021 2141   12/26/2021 2030  azithromycin (ZITHROMAX) 500 mg in sodium chloride 0.9 % 250 mL IVPB  500 mg 250 mL/hr over 60 Minutes Intravenous  Once 12/31/2021 2025 12/21/2021 2312        Assessment/Plan S/p exploratory laparotomy,  R colectomy, abthera placement 01/14/2022 Dr. Rosendo Gros S/p exploratory laparotomy, intestinal anastomosis, abdominal closure 01/31/2022 Dr. Rosendo Gros  for ischemic bowel with perforation - afebrile this AM (hypothermic), weaning low dose levo per primary team - WBC  5.9 - CT scan 8/21 with right sided fluid collection, air - ?anastomotic leak vs abscess vs post op change; IR consulted 8/21 for drainage; hold xarelto, last dose 8/20 PM. Patient was too ill/unstable for procedure yesterday. Ideally she could go to IR today for drainage of this collection. Once the drain is in place and we can better characterize what type of fluid it is (bowel vs anastomotic leak) I think starting trickle tube feeds is ok.  - wet-to-dry dressing to midline BID    FEN: NPO for now, meds per OG ok  ID: Zosyn (stop date 8/14), cefepime/flagyl 8/19 >> VTE: SCD's, DOAC held Foley: in place for strict I&Os Dispo: progressive   Below per TRH -- Septic shock due to above Acute hypoxic respiratory failure Cardiac arrest 7/30, 7 mins CPR Hx cardiomyopathy COPD Afib, permanent CAP     LOS: 25 days   I reviewed nursing notes, hospitalist notes, last 24 h vitals and pain scores, last 48 h intake and output, last 24 h labs and trends, and last 24 h imaging results.  Obie Dredge, PA-C [facility] Please see Amion for pager number during day hours 7:00am-4:30pm

## 2022-01-26 NOTE — Progress Notes (Signed)
Pt placed on PS/CPAP 8/5 on 40% and is tolerating well. RT will monitor. 

## 2022-01-26 NOTE — Progress Notes (Signed)
Ventilator patient transported from 2H09 to CT3 and back without any complications.

## 2022-01-26 NOTE — Consult Note (Signed)
WOC Nurse Consult Note: Reason for Consult:Right nare partial thickness skin loss with crusting. NGT x2 in the past per Nursing documentation was in left nare and via oral cavity; no indication that a medical device has been present in this location. Etiology is not known, differential diagnoses included trauma during intubation and viral. Wound type:Partial thickness  Pressure Injury POA: N/A Measurement:1.6cm x 1.8cm with dried serum obscuring majority of wound bed. Wound bed: 20% dry, red with 80% dried serum Drainage (amount, consistency, odor) None Periwound: intact, dry, edematous. ET is inferiorly located. Dressing procedure/placement/frequency: I will provide Nursing with guidance for the topical care of this lesion using a NS cleanse and twice daily application of bacitracin ointment for 14 days.  [place] nursing team will not follow, but will remain available to this patient, the nursing and medical teams.  Please re-consult if needed.  Thank you for inviting [place] to participate in this patient's Plan of Care.  Maudie Flakes, MSN, RN, CNS, [place], Serita Grammes, [employer], [employer] phone:  [phone]

## 2022-01-26 NOTE — Procedures (Signed)
Interventional Radiology Procedure Note  Procedure: CT DRAIN RLQ ABSCESS    Complications: None  Estimated Blood Loss:  MIN  Findings: 10 FR DRAIN FECAL CONTAMINATED FLD  GS CX SENT     Tamera Punt, MD

## 2022-01-26 NOTE — Progress Notes (Signed)
Inpatient Rehab Admissions Coordinator:    Harriette Bouillon expedited appeal for CIR, but Pt. Now on vent, not appropriate. Will follow for a few days but may sign off and re-start auth process once medically stable.  Clemens Catholic, [place], [place] Admissions Coordinator  [phone] ([place]) [phone] (office)

## 2022-01-26 NOTE — Progress Notes (Signed)
Progress Note  Patient Name: [name] Date of Encounter: 01/26/2022  [facility] Cardiologist: Kirk Ruths, MD   Subjective   Re-intubated yesterday for respiratory distress, requiring norepinephrine.  Inpatient Medications    Scheduled Meds:  arformoterol  15 mcg Nebulization BID   bacitracin   Topical BID   budesonide (PULMICORT) nebulizer solution  0.25 mg Nebulization BID   docusate  100 mg Per Tube BID   mupirocin ointment   Nasal BID   mouth rinse  15 mL Mouth Rinse Q2H   pantoprazole  40 mg Per Tube Daily   polyethylene glycol  17 g Per Tube Daily   revefenacin  175 mcg Nebulization Daily   Continuous Infusions:  sodium chloride Stopped (01/23/22 1731)   sodium chloride 50 mL/hr at 01/26/22 0800   sodium chloride     ceFEPime (MAXIPIME) IV Stopped (01/20/2022 2157)   dexmedetomidine (PRECEDEX) IV infusion Stopped (01/26/22 6283)   metronidazole Stopped (01/09/2022 2229)   norepinephrine (LEVOPHED) Adult infusion 1 mcg/min (01/26/22 0800)   potassium chloride 10 mEq (01/26/22 0848)   vancomycin Stopped (01/17/2022 1439)   PRN Meds: acetaminophen, acetaminophen, acetaminophen, fentaNYL (SUBLIMAZE) injection, fentaNYL (SUBLIMAZE) injection, guaiFENesin, haloperidol lactate, levalbuterol, lip balm, ondansetron (ZOFRAN) IV, mouth rinse, oxyCODONE, sodium chloride, sodium chloride, sodium chloride flush, white petrolatum   Vital Signs    Vitals:   01/26/22 0815 01/26/22 0829 01/26/22 0830 01/26/22 0845  BP:   (!) 96/58 (!) 97/42  Pulse: 86  91 68  Resp: (!) '22  20 17  '$ Temp:      TempSrc:      SpO2: 100% 100% 100% 100%  Weight:      Height:        Intake/Output Summary (Last 24 hours) at 01/26/2022 0920 Last data filed at 01/26/2022 0800 Gross per 24 hour  Intake 3547.22 ml  Output 871 ml  Net 2676.22 ml      01/26/2022    5:00 AM 01/30/2022    5:48 AM 01/24/2022    3:50 AM  Last 3 Weights  Weight (lbs) 159 lb 13.3 oz 147 lb 4.3 oz 145 lb 11.6 oz   Weight (kg) 72.5 kg 66.8 kg 66.1 kg      Telemetry    Atrial fibrillation with slow ventricular response- Personally Reviewed  ECG    No new tracing- Personally Reviewed  Physical Exam   GEN: intubated, no longer sedated but not responsive to verbal stimuli Neck: No JVD Cardiac: iRRR, no murmur Respiratory: Clear anterior GI: soft MS: No edema, feet in unnaboot Neuro:  unable to assess Psych: not sedated but not responsive   Labs    High Sensitivity Troponin:   Recent Labs  Lab 01/02/22 1308 01/03/22 2119 01/04/22 0513 01/05/22 0502 01/08/2022 1428  TROPONINIHS 11 123* 131* 55* 24*     Chemistry Recent Labs  Lab 01/22/22 0513 01/23/22 0530 01/24/22 0432 01/12/2022 0530 01/30/2022 1428 01/10/2022 1701 01/26/22 0000  NA 138 141 134* 141 139 142 142  K 3.8 4.6 3.4* 4.6 4.9 4.4 3.1*  CL 98 99 101 109 110  --  118*  CO2 35* 34* '26 27 23  '$ --  20*  GLUCOSE 106* 118* 105* 86 75  --  127*  BUN 17 20 26* 27* 29*  --  24*  CREATININE 0.70 0.88 1.00 0.83 1.20*  --  0.86  CALCIUM 9.3 9.5 8.2* 8.9 9.0  --  7.1*  MG 1.5* 1.7  --   --   --   --  1.2*  PROT  --   --  4.9*  --  5.8*  --  4.3*  ALBUMIN  --   --  1.8*  --  2.1*  --  <1.5*  AST  --   --  23  --  22  --  17  ALT  --   --  25  --  27  --  21  ALKPHOS  --   --  64  --  66  --  49  BILITOT  --   --  1.2  --  1.2  --  0.8  GFRNONAA >60 >60 58* >60 46*  --  >60  ANIONGAP '5 8 7 5 6  '$ --  4*    Lipids  No results for input(s): "CHOL", "TRIG", "HDL", "LABVLDL", "LDLCALC", "CHOLHDL" in the last 168 hours.   Hematology Recent Labs  Lab 01/28/2022 0530 01/21/2022 1428 01/10/2022 1701 01/26/22 0000  WBC 9.3 9.1  --  5.9  RBC 2.01* 2.03*  --  2.16*  HGB 7.0* 6.9* 7.1* 7.1*  HCT 22.8* 23.1* 21.0* 22.5*  MCV 113.4* 113.8*  --  104.2*  MCH 34.8* 34.0  --  32.9  MCHC 30.7 29.9*  --  31.6  RDW 16.2* 16.2*  --  21.2*  PLT 365 379  --  271   Thyroid No results for input(s): "TSH", "FREET4" in the last 168 hours.   BNP Recent Labs  Lab 02/02/2022 1516  BNP 820.0*    DDimer No results for input(s): "DDIMER" in the last 168 hours.   Radiology    DG CHEST PORT 1 VIEW  Result Date: 01/26/2022 CLINICAL DATA:  Endotracheal tube.  Respiratory distress. EXAM: PORTABLE CHEST 1 VIEW COMPARISON:  01/07/2022 FINDINGS: Endotracheal tube is in place, tip approximately 1.2 centimeters above the carina, directed towards the RIGHT mainstem bronchus. Consider withdrawing endotracheal tube 1.5 centimeters. Nasogastric tube side port overlies the level of proximal stomach. LEFT-sided PICC line tip overlies the superior vena cava. Heart margins are obscured. There is dense opacity throughout the LOWER portion of the RIGHT lung, significantly increased compared with prior study. There is residual aerated lung at the RIGHT lung apex. There is subsegmental atelectasis at the MEDIAL LEFT lung base partially obscuring the hemidiaphragm. IMPRESSION: Significant increase in RIGHT pulmonary opacity. Endotracheal tube is directed towards the RIGHT mainstem bronchus, just above the carina. Consider withdrawing endotracheal tube 5 centimeters and reassessing position. These results will be called to the ordering clinician or representative by the Radiologist Assistant, and communication documented in the PACS or [employer]. Electronically Signed   By: Nolon Nations M.D.   On: 01/26/2022 08:25   DG Chest Port 1 View  Result Date: 02/02/2022 CLINICAL DATA:  Intubation EXAM: PORTABLE CHEST 1 VIEW COMPARISON:  Portable exam 1526 hours compared to 01/22/2022 FINDINGS: Tip of endotracheal tube projects 1.9 cm above carina. Nasogastric tube extends into stomach. LEFT arm PICC line tip projects over RIGHT atrium; recommend withdrawal 4 cm. Normal heart size, mediastinal contours, and pulmonary vascularity. Persistent RIGHT basilar opacity consistent with effusion and atelectasis versus infiltrate. LEFT basilar atelectasis, slightly  increased. No pneumothorax. IMPRESSION: Recommend withdrawal of RIGHT arm PICC line 4 cm. Persistent RIGHT pleural effusion and basilar atelectasis versus infiltrate with slightly increased LEFT basilar atelectasis. Electronically Signed   By: Lavonia Dana M.D.   On: 01/17/2022 15:35    Cardiac Studies   Echocardiogram 01/10/2022    1. Left ventricular ejection fraction, by estimation, is 60  to 65%. Left  ventricular ejection fraction by 2D MOD biplane is 60.6 %. The left  ventricle has normal function. The left ventricle has no regional wall  motion abnormalities. There is mild left  ventricular hypertrophy. Left ventricular diastolic function could not be  evaluated.   2. Right ventricular systolic function is moderately reduced. The right  ventricular size is normal. There is severely elevated pulmonary artery  systolic pressure. The estimated right ventricular systolic pressure is  16.1 mmHg.   3. The mitral valve is grossly normal. Trivial mitral valve  regurgitation.   4. The aortic valve is tricuspid. Aortic valve regurgitation is mild to  moderate. Aortic valve sclerosis is present, with no evidence of aortic  valve stenosis. Aortic regurgitation PHT measures 227 msec.   5. The inferior vena cava is normal in size with <50% respiratory  variability, suggesting right atrial pressure of 8 mmHg.   Comparison(s): Changes from prior study are noted. 01/04/2022: LVEF 25-30%,  Takatsubo-type wall motion abnormalities.    Patient Profile     81 y.o. 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  Assessment & Plan     Acute on chronic HF: Recovery of EF on last evaluation. Pleural effusions but also with concern of aspiration  PNA/intraabdominal process with possible abscess/anastomotic leak. No indication for diuresis with above concerns.  AFib: Rate SVR on precedex, improved after stopping.  On Xarelto but held for procedure for possible IR fluid collection drainage. Sepsis: recurrent with concern for septic shock. S/P partial colectomy - IR eval as above. HTN: meds on hold with shock and pressors. Asthma: bisoprolol preferred over propranolol when able to take PO again. PAH: Per prior consult team:  "This is likely to be multifactorial, but at least in part is due to diastolic left heart failure.  Estimated systolic PA pressure almost 80 mmHg on current echocardiogram.  The echocardiogram in 2021 reported estimated PA pressure 36 mmHg, up to 50 mmHg on echo in 2022.  Estimation with echo is fraught with overestimation error due to the irregular heart rhythm, but the pulmonary pressure is clearly elevated.  Right ventricular function has deteriorated.  ABG during this admission c/w chronic hypoventilation.  Suspect her lung disease is more complex than just asthma.  On chronic anticoagulation so thromboembolic disease is unlikely.  Unable to really evaluate pulmonary function at this time due to acute illness."     For questions or updates, please contact [place] Please consult [email or web address] for contact info under        Signed, Elouise Munroe, MD  01/26/2022, 9:20 AM

## 2022-01-26 NOTE — Progress Notes (Signed)
OT Cancellation Note  Patient Details Name: [name] MRN: [id number] DOB: February 22, 1941   Cancelled Treatment:    Reason Eval/Treat Not Completed: Patient not medically ready- intubated and sedated. Will check back as appropriate/able.   [place] Office [place] 01/26/2022, 11:19 AM

## 2022-01-26 NOTE — Progress Notes (Signed)
ETT pulled back 3cm per MD Verbal Order per CXR. ETT is now at 20cm at the lip.

## 2022-01-26 NOTE — Progress Notes (Signed)
PT Cancellation Note  Patient Details Name: ZAHRA PEFFLEY MRN: [id number] DOB: 1940-06-18   Cancelled Treatment:    Reason Eval/Treat Not Completed: Patient not medically ready (pt intubated, sedated and not currently appropriate discussed with RN)   Sandy Salaam Siboney Requejo 01/26/2022, 11:32 AM [place] Office: [phone]

## 2022-01-26 NOTE — Progress Notes (Signed)
NAME:  [name], MRN:  [id number], DOB:  [birth year], LOS: 40 ADMISSION DATE:  12/18/2021, CONSULTATION DATE:  01/22/2022 REFERRING MD:  Bonner Puna CHIEF COMPLAINT:  Hypotension, abdominal pain   History of Present Illness:  81 yo female admited for CAP causing respiratory failure, had a cardiac arrest after admission and was moved to the ICU.  Chest tube placed for parapneumonic effusion, extubated on 8/2. Sent out of ICU on 8/5. 8/6 developed ischemic colitis initially treated conservatively but this morning we were called back 8/7 for worsening shock, confusion. She had exploratory laparotomy performed by general surgery on 8/7 for ischemic colitis with perforation s/p right colectomy and repeat laparotomy on 8/9 with intestinal anastomosis and abdominal closure. She was extubated on 8/11 and transferred to the general medical floor. She had CT abdomen 8/20 which should right sided abdominal fluid collection in which IR was consulted for drainage. Rapid response was called in the afternoon 8/21 for respiratory distress in which she was intubated and transferred to the ICU.   Pertinent  Medical History  Breast cancer on letrozole Atrial fibrillation  Significant Hospital Events: Including procedures, antibiotic start and stop dates in addition to other pertinent events   7/28 admit, Ceftriaxone/Azithro 7/29 last dose xeralto  7/30 IHCA x 7 min, Ceftriaxone>Cefepime 7/31 Chest tube placed, heparin gtt started 8/1 patient remove chest tube.  Head CT obtained >negative.  Severely agitated overnight.  Vancomycin stopped.  Azithromycin stopped. 8/6 Transferred to [facility] 8/7 Called CCM back for abd, Distention , hypotension and a fib with RVR, plan is for  urgent OR pending goals of care discussion then ICU post op if husband is in agreement. 8/10 Abdomen closed yesterday, SBT/SAT today 8/11 extubated yesterday, doing well on Lucas, up OOB, feeling alright 8/13 transferred to medical floor 8/14  chest ultrasound shows atelectasis without significant right basilar effusion. 8/21 back to ICU for resp distress  Interim History / Subjective:   Yesterday patient was intubated and transferred to ICU CXR shows ET tube in right main stem  Objective   Blood pressure (!) 97/42, pulse 68, temperature (!) 96.3 F (35.7 C), temperature source Bladder, resp. rate 17, height 5' (1.524 m), weight 72.5 kg, SpO2 100 %.    Vent Mode: PSV;CPAP FiO2 (%):  [40 %-100 %] 40 % Set Rate:  [16 bmp-20 bmp] 20 bmp Vt Set:  [360 mL] 360 mL PEEP:  [5 cmH20] 5 cmH20 Pressure Support:  [8 cmH20] 8 cmH20 Plateau Pressure:  [9 BDZ32-99 cmH20] 9 cmH20   Intake/Output Summary (Last 24 hours) at 01/26/2022 0916 Last data filed at 01/26/2022 0800 Gross per 24 hour  Intake 3547.22 ml  Output 871 ml  Net 2676.22 ml   Filed Weights   01/24/22 0350 01/24/2022 0548 01/26/22 0500  Weight: 66.1 kg 66.8 kg 72.5 kg   Examination: General: elderly woman, chronically ill appearing, no acute distress, intubated/sedated HENT: [place]/AT, moist mucous membranes, sclera anicteric Lungs: course breath sounds, no weehzing Cardiovascular: rrr, no murmurs Abdomen: soft, surgical dressing in place, BS present Extremities: warm, no edema Neuro: sedated GU: foley in place  Resolved Hospital Problem list     Assessment & Plan:  Septic Shock - MAP goal 65 or higher - Wean levophed as able - In setting of intrabdominal fluid collection and possible aspiration pneumonia with new dense RLL opacity vs effusion - Will perform chest [place] to evaluate for effusion - zosyn + vancomycin for antibiotic coverage  Acute Hypoxemic Respiratory Failure PAH  group 2  CAP with parapneumonic effusion, s/p right pigtail - removed 8/1 Aspiration Pneumonia - Continue ventilator support with lung protective strategies  - Wean PEEP and FiO2 for sats greater than 90%. - Head of bed elevated 30 degrees. - Plateau pressures less than 30 cm H20.  -  VAP bundle in place  - PAD protocol  - pull ET tube back 3cm based on AM chest radiograph  Ischemic bowel with perf, s/p ex lap, intestinal anastomosis Intraabdominal fluid collection  -?anastomotic leak vs post op abscess  -IR consulted 8/21 for drainage  -abx as above    Metabolic Acidosis Hypokalemia Hypomagnesemia - LR at 69m/hr - replete electrolytes as needed  Afib -holding diuretics, beta blocker in setting of shock  -hold systemic AC   Acute anemia, symptomatic  -post op, critical illness, iatrogenic losses - Trend cbc - monitor for bleeding   Severe protein calorie malnutrition  -diet per CCS   Best Practice (right click and "Reselect all SmartList Selections" daily)   Diet/type: NPO DVT prophylaxis: SCD GI prophylaxis: PPI Lines: Central line Foley:  Yes, and it is still needed Code Status:  full code Last date of multidisciplinary goals of care discussion [n/a]  Labs   CBC: Recent Labs  Lab 01/20/22 0320 01/21/22 0513 01/23/22 0530 01/24/22 0926 01/24/22 1040 01/30/2022 0530 01/31/2022 1428 01/16/2022 1701 01/26/22 0000  WBC 14.0* 9.5   < > 14.9* 17.5* 9.3 9.1  --  5.9  NEUTROABS 11.1* 7.1  --   --  14.7*  --  7.0  --   --   HGB 8.3* 7.6*   < > 6.3* 7.8* 7.0* 6.9* 7.1* 7.1*  HCT 26.0* 23.2*   < > 19.7* 25.0* 22.8* 23.1* 21.0* 22.5*  MCV 106.6* 104.0*   < > 110.1* 110.6* 113.4* 113.8*  --  104.2*  PLT 292 294   < > 297 328 365 379  --  271   < > = values in this interval not displayed.    Basic Metabolic Panel: Recent Labs  Lab 01/20/22 0320 01/21/22 0513 01/22/22 0513 01/23/22 0530 01/24/22 0432 01/19/2022 0530 01/21/2022 1428 01/28/2022 1701 01/26/22 0000  NA 139   < > 138 141 134* 141 139 142 142  K 3.5   < > 3.8 4.6 3.4* 4.6 4.9 4.4 3.1*  CL 97*   < > 98 99 101 109 110  --  118*  CO2 33*   < > 35* 34* '26 27 23  '$ --  20*  GLUCOSE 90   < > 106* 118* 105* 86 75  --  127*  BUN 21   < > 17 20 26* 27* 29*  --  24*  CREATININE 0.55   < > 0.70  0.88 1.00 0.83 1.20*  --  0.86  CALCIUM 9.7   < > 9.3 9.5 8.2* 8.9 9.0  --  7.1*  MG 1.8  --  1.5* 1.7  --   --   --   --  1.2*  PHOS 2.7  --   --   --   --   --   --   --   --    < > = values in this interval not displayed.   GFR: Estimated Creatinine Clearance: 47.9 mL/min (by C-G formula based on SCr of 0.86 mg/dL). Recent Labs  Lab 01/23/22 1658 01/23/22 1730 01/24/22 0086708/20/23 0619508/20/23 1040 01/22/2022 0530 02/04/2022 1428 02/02/2022 1559 01/21/2022 1619 01/07/2022 1728 01/26/22 0000 01/26/22 0440  PROCALCITON  --    < > 0.85  --   --  0.56  --   --  0.47  --  0.33  --   WBC  --   --   --    < > 17.5* 9.3 9.1  --   --   --  5.9  --   LATICACIDVEN 1.1  --   --   --   --   --   --  0.7  --  0.8  --  1.0   < > = values in this interval not displayed.    Liver Function Tests: Recent Labs  Lab 01/24/22 0432 01/12/2022 1428 01/26/22 0000  AST '23 22 17  '$ ALT '25 27 21  '$ ALKPHOS 64 66 49  BILITOT 1.2 1.2 0.8  PROT 4.9* 5.8* 4.3*  ALBUMIN 1.8* 2.1* <1.5*   No results for input(s): "LIPASE", "AMYLASE" in the last 168 hours. No results for input(s): "AMMONIA" in the last 168 hours.  ABG    Component Value Date/Time   PHART 7.239 (L) 01/05/2022 1701   PCO2ART 65.1 (HH) 01/17/2022 1701   PO2ART 368 (H) 02/03/2022 1701   HCO3 27.5 01/20/2022 1701   TCO2 29 01/23/2022 1701   ACIDBASEDEF 3.8 (H) 01/10/2022 1420   O2SAT 100 01/26/2022 1701     Coagulation Profile: No results for input(s): "INR", "PROTIME" in the last 168 hours.  Cardiac Enzymes: No results for input(s): "CKTOTAL", "CKMB", "CKMBINDEX", "TROPONINI" in the last 168 hours.  HbA1C: Hgb A1c MFr Bld  Date/Time Value Ref Range Status  01/03/2022 09:18 PM 4.9 4.8 - 5.6 % Final    Comment:    (NOTE) Pre diabetes:          5.7%-6.4%  Diabetes:              >6.4%  Glycemic control for   <7.0% adults with diabetes     CBG: Recent Labs  Lab 01/24/22 0744 01/26/22 0007 01/26/22 0445 01/26/22 0828   GLUCAP 76 118* 89 108*    Critical care time: 40 minutes    Freda Jackson, MD [facility] Office: [phone]   See Amion for personal pager PCCM on call pager [phone] until 7pm. Please call [place] 7p-7a. [phone]

## 2022-01-26 NOTE — Consult Note (Signed)
Palliative Care Consult Note                                  Date: 01/26/2022   Patient Name: [name]  DOB: Jun 12, 1940  MRN: [id number]  Age / Sex: 81 y.o., female  PCP: [facility] [place] Referring Physician: Freddi Starr, MD  Reason for Consultation: {Reason for Consult:23484}  HPI/Patient Profile: 81 y.o. female  with past medical history of *** admitted on 12/28/2021 with ***.   Past Medical History:  Diagnosis Date  . Anemia    history of  . Anxiety   . Arthritis   . Asthma   . Breast cancer ([place])   . Cancer ([place])    skin cancers, one melanoma   . Complication of anesthesia    patient woke up during colonoscopy  . Concussion    8 yrs. ago due to being thrown from a horse  . Dislocated elbow 10/12/2016   left  . Family history of breast cancer   . GERD (gastroesophageal reflux disease)   . History of kidney stones   . History of radiation therapy 03/23/17-05/04/17   right chest wall 50.4 Gy in 28 fractions, right axilla 45 Gy in 25 fractions  . Hypertension   . 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 Keratosis, actinic   . Melanoma ([place])   . Persistent atrial fibrillation ([place]) 12/16/2016   CHA2DS2VASC score 3  . Pneumonia    hx of     Subjective:   I have reviewed medical records including EPIC notes, labs and imaging, received report from the team, and assessed the patient at bedside.   I met with *** to discuss diagnosis, prognosis, GOC, EOL wishes, disposition, and options.  I introduced Palliative Medicine as specialized medical care for people living with serious illness. It focuses on providing relief from the symptoms and stress of a serious illness.   We discussed patient's current illness and what it means in the larger context of his/her ongoing co-morbidities. Current clinical status was reviewed. Natural disease trajectory of *** was discussed.  Created space  and opportunity for patient and family to explore thoughts and feelings regarding current medical situation.  Values and goals of care important to patient and family were attempted to be elicited.  A discussion was had today regarding advanced directives. Concepts specific to code status, artifical feeding and hydration, continued IV antibiotics and rehospitalization was had.  The MOST form was introduced and discussed.  Questions and concerns addressed. Patient/family encouraged to call with questions or concerns.     Life Review: ***  Functional Status: ***  Patient/Family Understanding of Illness: ***  Patient Values: ***  Goals: ***  Additional Discussion: ***  Review of Systems  Objective:   Primary Diagnoses: Present on Admission: . CAP (community acquired pneumonia) . A-fib ([place]) . Hypertensive heart disease without CHF . Hypotension . Lactic acid increased   Physical Exam  Vital Signs:  BP 125/74 (BP Location: Right Leg)   Pulse 73   Temp 99 F (37.2 C)   Resp 20   Ht 5' (1.524 m)   Wt 72.5 kg   SpO2 100%   BMI 31.22 kg/m   Palliative Assessment/Data: ***     Assessment & Plan:   SUMMARY OF RECOMMENDATIONS   ***  Primary Decision Maker: {Primary Decision OZYYQ:82500}  Code Status/Advance Care Planning: {Palliative Code  status:23503}  Symptom Management:  ***  Prognosis:  {Palliative Care Prognosis:23504}  Discharge Planning:  {Palliative dispostion:23505}   Discussed with: ***    Thank you for allowing [place] to participate in the care of [name]   Time Total: ***  Greater than 50%  of this time was spent counseling and coordinating care related to the above assessment and plan.  Signed by: Elie Confer, NP Palliative Medicine Team  Team Phone # [phone]  For individual providers, please see AMION

## 2022-01-27 ENCOUNTER — Inpatient Hospital Stay (HOSPITAL_COMMUNITY): Payer: Medicare Other

## 2022-01-27 ENCOUNTER — Inpatient Hospital Stay (HOSPITAL_COMMUNITY): Payer: Medicare Other | Admitting: Anesthesiology

## 2022-01-27 ENCOUNTER — Encounter (HOSPITAL_COMMUNITY): Admission: EM | Disposition: E | Payer: Self-pay | Source: Home / Self Care | Attending: Internal Medicine

## 2022-01-27 DIAGNOSIS — K9189 Other postprocedural complications and disorders of digestive system: Secondary | ICD-10-CM

## 2022-01-27 DIAGNOSIS — J189 Pneumonia, unspecified organism: Secondary | ICD-10-CM | POA: Diagnosis not present

## 2022-01-27 DIAGNOSIS — J9601 Acute respiratory failure with hypoxia: Secondary | ICD-10-CM | POA: Diagnosis not present

## 2022-01-27 DIAGNOSIS — I509 Heart failure, unspecified: Secondary | ICD-10-CM

## 2022-01-27 DIAGNOSIS — I110 Hypertensive heart disease with heart failure: Secondary | ICD-10-CM

## 2022-01-27 DIAGNOSIS — K559 Vascular disorder of intestine, unspecified: Secondary | ICD-10-CM | POA: Diagnosis not present

## 2022-01-27 DIAGNOSIS — F419 Anxiety disorder, unspecified: Secondary | ICD-10-CM | POA: Diagnosis not present

## 2022-01-27 DIAGNOSIS — I469 Cardiac arrest, cause unspecified: Secondary | ICD-10-CM | POA: Diagnosis not present

## 2022-01-27 HISTORY — PX: LYSIS OF ADHESION: SHX5961

## 2022-01-27 HISTORY — PX: LAPAROTOMY: SHX154

## 2022-01-27 LAB — POCT I-STAT 7, (LYTES, BLD GAS, ICA,H+H)
Acid-base deficit: 3 mmol/L — ABNORMAL HIGH (ref 0.0–2.0)
Acid-base deficit: 3 mmol/L — ABNORMAL HIGH (ref 0.0–2.0)
Acid-base deficit: 4 mmol/L — ABNORMAL HIGH (ref 0.0–2.0)
Acid-base deficit: 3 mmol/L — ABNORMAL HIGH (ref 0.0–2.0)
Bicarbonate: 21.2 mmol/L (ref 20.0–28.0)
Bicarbonate: 20.3 mmol/L (ref 20.0–28.0)
Bicarbonate: 21.6 mmol/L (ref 20.0–28.0)
Bicarbonate: 20.9 mmol/L (ref 20.0–28.0)
Calcium, Ion: 1.33 mmol/L (ref 1.15–1.40)
Calcium, Ion: 1.28 mmol/L (ref 1.15–1.40)
Calcium, Ion: 1.33 mmol/L (ref 1.15–1.40)
Calcium, Ion: 1.38 mmol/L (ref 1.15–1.40)
HCT: 22 % — ABNORMAL LOW (ref 36.0–46.0)
HCT: 23 % — ABNORMAL LOW (ref 36.0–46.0)
HCT: 26 % — ABNORMAL LOW (ref 36.0–46.0)
HCT: 33 % — ABNORMAL LOW (ref 36.0–46.0)
Hemoglobin: 7.5 g/dL — ABNORMAL LOW (ref 12.0–15.0)
Hemoglobin: 7.8 g/dL — ABNORMAL LOW (ref 12.0–15.0)
Hemoglobin: 8.8 g/dL — ABNORMAL LOW (ref 12.0–15.0)
Hemoglobin: 11.2 g/dL — ABNORMAL LOW (ref 12.0–15.0)
O2 Saturation: 98 %
O2 Saturation: 99 %
O2 Saturation: 99 %
O2 Saturation: 99 %
Patient temperature: 35.7
Patient temperature: 36
Potassium: 4.2 mmol/L (ref 3.5–5.1)
Potassium: 3.3 mmol/L — ABNORMAL LOW (ref 3.5–5.1)
Potassium: 3.5 mmol/L (ref 3.5–5.1)
Potassium: 3.6 mmol/L (ref 3.5–5.1)
Sodium: 142 mmol/L (ref 135–145)
Sodium: 144 mmol/L (ref 135–145)
Sodium: 145 mmol/L (ref 135–145)
Sodium: 143 mmol/L (ref 135–145)
TCO2: 22 mmol/L (ref 22–32)
TCO2: 21 mmol/L — ABNORMAL LOW (ref 22–32)
TCO2: 23 mmol/L (ref 22–32)
TCO2: 22 mmol/L (ref 22–32)
pCO2 arterial: 32.2 mmHg (ref 32–48)
pCO2 arterial: 34.2 mmHg (ref 32–48)
pCO2 arterial: 34.5 mmHg (ref 32–48)
pCO2 arterial: 33.7 mmHg (ref 32–48)
pH, Arterial: 7.421 (ref 7.35–7.45)
pH, Arterial: 7.378 (ref 7.35–7.45)
pH, Arterial: 7.404 (ref 7.35–7.45)
pH, Arterial: 7.4 (ref 7.35–7.45)
pO2, Arterial: 89 mmHg (ref 83–108)
pO2, Arterial: 135 mmHg — ABNORMAL HIGH (ref 83–108)
pO2, Arterial: 152 mmHg — ABNORMAL HIGH (ref 83–108)
pO2, Arterial: 132 mmHg — ABNORMAL HIGH (ref 83–108)

## 2022-01-27 LAB — GLUCOSE, CAPILLARY
Glucose-Capillary: 106 mg/dL — ABNORMAL HIGH (ref 70–99)
Glucose-Capillary: 106 mg/dL — ABNORMAL HIGH (ref 70–99)
Glucose-Capillary: 128 mg/dL — ABNORMAL HIGH (ref 70–99)
Glucose-Capillary: 149 mg/dL — ABNORMAL HIGH (ref 70–99)
Glucose-Capillary: 54 mg/dL — ABNORMAL LOW (ref 70–99)
Glucose-Capillary: 59 mg/dL — ABNORMAL LOW (ref 70–99)
Glucose-Capillary: 79 mg/dL (ref 70–99)
Glucose-Capillary: 93 mg/dL (ref 70–99)

## 2022-01-27 LAB — CBC WITH DIFFERENTIAL/PLATELET
Abs Immature Granulocytes: 0.07 10*3/uL (ref 0.00–0.07)
Basophils Absolute: 0 10*3/uL (ref 0.0–0.1)
Basophils Relative: 0 %
Eosinophils Absolute: 0.5 10*3/uL (ref 0.0–0.5)
Eosinophils Relative: 7 %
HCT: 24.9 % — ABNORMAL LOW (ref 36.0–46.0)
Hemoglobin: 7.8 g/dL — ABNORMAL LOW (ref 12.0–15.0)
Immature Granulocytes: 1 %
Lymphocytes Relative: 8 %
Lymphs Abs: 0.6 10*3/uL — ABNORMAL LOW (ref 0.7–4.0)
MCH: 32.6 pg (ref 26.0–34.0)
MCHC: 31.3 g/dL (ref 30.0–36.0)
MCV: 104.2 fL — ABNORMAL HIGH (ref 80.0–100.0)
Monocytes Absolute: 0.5 10*3/uL (ref 0.1–1.0)
Monocytes Relative: 8 %
Neutro Abs: 5.3 10*3/uL (ref 1.7–7.7)
Neutrophils Relative %: 76 %
Platelets: 321 10*3/uL (ref 150–400)
RBC: 2.39 MIL/uL — ABNORMAL LOW (ref 3.87–5.11)
RDW: 20.1 % — ABNORMAL HIGH (ref 11.5–15.5)
WBC: 6.9 10*3/uL (ref 4.0–10.5)
nRBC: 0 % (ref 0.0–0.2)

## 2022-01-27 LAB — BASIC METABOLIC PANEL
Anion gap: 3 — ABNORMAL LOW (ref 5–15)
BUN: 17 mg/dL (ref 8–23)
CO2: 21 mmol/L — ABNORMAL LOW (ref 22–32)
Calcium: 9 mg/dL (ref 8.9–10.3)
Chloride: 118 mmol/L — ABNORMAL HIGH (ref 98–111)
Creatinine, Ser: 0.75 mg/dL (ref 0.44–1.00)
GFR, Estimated: >60 mL/min (ref >60)
Glucose, Bld: 109 mg/dL — ABNORMAL HIGH (ref 70–99)
Potassium: 3.3 mmol/L — ABNORMAL LOW (ref 3.5–5.1)
Sodium: 142 mmol/L (ref 135–145)

## 2022-01-27 LAB — CULTURE, BLOOD (ROUTINE X 2)
Culture: NO GROWTH
Culture: NO GROWTH

## 2022-01-27 LAB — CULTURE, RESPIRATORY W GRAM STAIN
Culture: NORMAL
Special Requests: NORMAL

## 2022-01-27 LAB — PREPARE RBC (CROSSMATCH)

## 2022-01-27 LAB — PROCALCITONIN: Procalcitonin: 0.2 ng/mL

## 2022-01-27 SURGERY — LAPAROTOMY, EXPLORATORY
Anesthesia: General | Site: Abdomen

## 2022-01-27 MED ORDER — PHENYLEPHRINE 80 MCG/ML (10ML) SYRINGE FOR IV PUSH (FOR BLOOD PRESSURE SUPPORT)
PREFILLED_SYRINGE | INTRAVENOUS | Status: AC
  Filled 2022-01-27: qty 10

## 2022-01-27 MED ORDER — ROCURONIUM BROMIDE 10 MG/ML (PF) SYRINGE
PREFILLED_SYRINGE | INTRAVENOUS | Status: DC | PRN
  Administered 2022-01-27 (×3): 50 mg via INTRAVENOUS

## 2022-01-27 MED ORDER — MIDAZOLAM HCL 2 MG/2ML IJ SOLN
INTRAMUSCULAR | Status: DC | PRN
  Administered 2022-01-27: 2 mg via INTRAVENOUS

## 2022-01-27 MED ORDER — SODIUM CHLORIDE 0.9% IV SOLUTION: Freq: Once | INTRAVENOUS | Status: DC

## 2022-01-27 MED ORDER — LACTATED RINGERS IV SOLN: INTRAVENOUS | Status: DC

## 2022-01-27 MED ORDER — VASOPRESSIN 20 UNIT/ML IV SOLN
INTRAVENOUS | Status: AC
  Filled 2022-01-27: qty 1

## 2022-01-27 MED ORDER — SODIUM CHLORIDE 0.9 % IV SOLN: INTRAVENOUS | Status: DC | PRN

## 2022-01-27 MED ORDER — 0.9 % SODIUM CHLORIDE (POUR BTL) OPTIME
TOPICAL | Status: DC | PRN
  Administered 2022-01-27: 1000 mL
  Administered 2022-01-27: 2000 mL
  Administered 2022-01-27: 1000 mL

## 2022-01-27 MED ORDER — DEXTROSE 50 % IV SOLN
25.0000 g | INTRAVENOUS | Status: AC
  Administered 2022-01-27: 25 g via INTRAVENOUS
  Filled 2022-01-27: qty 50

## 2022-01-27 MED ORDER — ROCURONIUM BROMIDE 10 MG/ML (PF) SYRINGE
PREFILLED_SYRINGE | INTRAVENOUS | Status: AC
  Filled 2022-01-27: qty 30

## 2022-01-27 MED ORDER — MIDAZOLAM HCL 2 MG/2ML IJ SOLN
INTRAMUSCULAR | Status: AC
  Filled 2022-01-27: qty 2

## 2022-01-27 MED ORDER — FENTANYL CITRATE (PF) 250 MCG/5ML IJ SOLN
INTRAMUSCULAR | Status: AC
  Filled 2022-01-27: qty 5

## 2022-01-27 MED ORDER — TRACE MINERALS CU-MN-SE-ZN 300-55-60-3000 MCG/ML IV SOLN
INTRAVENOUS | Status: AC
  Filled 2022-01-27: qty 375.2

## 2022-01-27 MED ORDER — POTASSIUM CHLORIDE 20 MEQ PO PACK
60.0000 meq | PACK | Freq: Once | ORAL | Status: AC
  Administered 2022-01-27: 60 meq
  Filled 2022-01-27: qty 3

## 2022-01-27 MED ORDER — CALCIUM CHLORIDE 10 % IV SOLN
INTRAVENOUS | Status: DC | PRN
  Administered 2022-01-27 (×2): .1 g via INTRAVENOUS

## 2022-01-27 MED ORDER — LACTATED RINGERS IV SOLN: INTRAVENOUS | Status: DC | PRN

## 2022-01-27 MED ORDER — ROCURONIUM BROMIDE 10 MG/ML (PF) SYRINGE
PREFILLED_SYRINGE | INTRAVENOUS | Status: AC
  Filled 2022-01-27: qty 10

## 2022-01-27 MED ORDER — PROPOFOL 10 MG/ML IV BOLUS
INTRAVENOUS | Status: AC
  Filled 2022-01-27: qty 20

## 2022-01-27 MED ORDER — FENTANYL CITRATE (PF) 250 MCG/5ML IJ SOLN
INTRAMUSCULAR | Status: DC | PRN
Start: 2022-01-27 — End: 2022-01-27
  Administered 2022-01-27 (×2): 50 ug via INTRAVENOUS
  Administered 2022-01-27 (×2): 100 ug via INTRAVENOUS

## 2022-01-27 SURGICAL SUPPLY — 55 items
BAG COUNTER SPONGE SURGICOUNT (BAG) ×2 IMPLANT
BAG SPNG CNTER NS LX DISP (BAG) ×1
BNDG GAUZE DERMACEA FLUFF (GAUZE/BANDAGES/DRESSINGS) ×1
BNDG GAUZE DERMACEA FLUFF 4 (GAUZE/BANDAGES/DRESSINGS) IMPLANT
BNDG GZE DERMACEA 4 6PLY (GAUZE/BANDAGES/DRESSINGS) ×1
BRR ADH 5X3 SEPRAFILM 6 SHT (MISCELLANEOUS) ×1
CANISTER SUCT 3000ML PPV (MISCELLANEOUS) ×2 IMPLANT
COVER MAYO STAND STRL (DRAPES) IMPLANT
COVER SURGICAL LIGHT HANDLE (MISCELLANEOUS) ×2 IMPLANT
DRAPE HALF SHEET 40X57 (DRAPES) IMPLANT
DRAPE LAPAROSCOPIC ABDOMINAL (DRAPES) ×2 IMPLANT
DRAPE UTILITY W/TAPE 26X15 (DRAPES) IMPLANT
DRAPE WARM FLUID 44X44 (DRAPES) ×2 IMPLANT
DRSG PAD ABDOMINAL 8X10 ST (GAUZE/BANDAGES/DRESSINGS) IMPLANT
ELECT BLADE 4.0 EZ CLEAN MEGAD (MISCELLANEOUS) ×1
ELECT BLADE 6.5 EXT (BLADE) IMPLANT
ELECT CAUTERY BLADE 6.4 (BLADE) ×2 IMPLANT
ELECT REM PT RETURN 9FT ADLT (ELECTROSURGICAL) ×1
ELECTRODE BLDE 4.0 EZ CLN MEGD (MISCELLANEOUS) IMPLANT
ELECTRODE REM PT RTRN 9FT ADLT (ELECTROSURGICAL) ×2 IMPLANT
GLOVE BIO SURGEON STRL SZ7.5 (GLOVE) IMPLANT
GLOVE BIOGEL M STRL SZ7.5 (GLOVE) ×2 IMPLANT
GLOVE BIOGEL PI IND STRL 6.5 (GLOVE) IMPLANT
GLOVE BIOGEL PI IND STRL 8 (GLOVE) IMPLANT
GLOVE BIOGEL PI INDICATOR 6.5 (GLOVE) ×2
GLOVE BIOGEL PI INDICATOR 8 (GLOVE) ×4
GLOVE INDICATOR 8.0 STRL GRN (GLOVE) ×4 IMPLANT
GOWN STRL REUS W/ TWL LRG LVL3 (GOWN DISPOSABLE) ×2 IMPLANT
GOWN STRL REUS W/TWL 2XL LVL3 (GOWN DISPOSABLE) ×2 IMPLANT
GOWN STRL REUS W/TWL LRG LVL3 (GOWN DISPOSABLE) ×1
HANDLE SUCTION POOLE (INSTRUMENTS) ×2 IMPLANT
KIT BASIN OR (CUSTOM PROCEDURE TRAY) ×2 IMPLANT
KIT TURNOVER KIT B (KITS) ×2 IMPLANT
LIGASURE IMPACT 36 18CM CVD LR (INSTRUMENTS) IMPLANT
NS IRRIG 1000ML POUR BTL (IV SOLUTION) ×4 IMPLANT
PACK GENERAL/GYN (CUSTOM PROCEDURE TRAY) ×2 IMPLANT
PAD ARMBOARD 7.5X6 YLW CONV (MISCELLANEOUS) ×2 IMPLANT
PENCIL SMOKE EVACUATOR (MISCELLANEOUS) ×2 IMPLANT
POUCH OSTOMY FLEX CONVEX 1-1/2 (OSTOMY) IMPLANT
RELOAD PROXIMATE 75MM BLUE (ENDOMECHANICALS) ×1 IMPLANT
RELOAD STAPLE 75 3.8 BLU REG (ENDOMECHANICALS) IMPLANT
SEPRAFILM PROCEDURAL PACK 3X5 (MISCELLANEOUS) IMPLANT
SPECIMEN JAR LARGE (MISCELLANEOUS) IMPLANT
SPONGE T-LAP 18X18 ~~LOC~~+RFID (SPONGE) IMPLANT
STAPLER PROXIMATE 75MM BLUE (STAPLE) IMPLANT
SUCTION POOLE HANDLE (INSTRUMENTS) ×1
SUT NOVA NAB GS-21 0 18 T12 DT (SUTURE) IMPLANT
SUT SILK 2 0 SH CR/8 (SUTURE) ×2 IMPLANT
SUT SILK 2 0 TIES 10X30 (SUTURE) ×2 IMPLANT
SUT SILK 3 0 SH CR/8 (SUTURE) ×2 IMPLANT
SUT SILK 3 0 TIES 10X30 (SUTURE) ×2 IMPLANT
SUT VIC AB 3-0 SH 18 (SUTURE) IMPLANT
TOWEL GREEN STERILE (TOWEL DISPOSABLE) ×2 IMPLANT
TUBE CONNECTING 12X1/4 (SUCTIONS) IMPLANT
YANKAUER SUCT BULB TIP NO VENT (SUCTIONS) IMPLANT

## 2022-01-27 NOTE — Progress Notes (Addendum)
PHARMACY - TOTAL PARENTERAL NUTRITION CONSULT NOTE  Indication: Intolerance to enteral feeding / anastomotic leak  Patient Measurements: Height: 5' (152.4 cm) Weight: 74.1 kg (163 lb 5.8 oz) IBW/kg (Calculated) : 45.5 TPN AdjBW (KG): 51 Body mass index is 31.9 kg/m.  Assessment:  28 YOF admited for CAP causing respiratory failure, had a cardiac arrest after admission and was moved to the ICU.  Chest tube placed for parapneumonic effusion, extubated on 8/2. Sent out of ICU on 8/5. On 8/6 she developed ischemic colitis, initially treated conservatively but transferred back to ICU for worsening shock and confusion 8/7.  Now s/p ex lap, R colectomy for ischemic bowel with perforation. Pt was provided TPN from 8/9-8/15. Pt's diet was advanced and pt ate 25-50% of meals from 8/15-8/20. Diet was changed to CLD on 8/20 for IR placement of drain for anastomotic leak. However, drain was not placed because pt acutely decompensated and was intubated and admitted to ICU and made NPO on 8/21. IR placed drain on 8/22 and drainage was positive for enteric contents confirming anastomotic leak. Surgery plans to perform resection of anastomosis and possible diversion with ileostomy on 8/23. Pharmacy consulted to manage TPN.  Glucose / Insulin: A1c 4.9% - hypoglycemic without nutrition, has not required insulin on enteral or TPN previously during this hospitalization Electrolytes: K 4.2 (goal >/= 4), Mag 2.4 (goal >/= 2), Cl 118, CO2 20, coCa 9.1, others WNL Renal: SCr < 1, BUN 24 Hepatic: LFTs / tbili / TG WNL, albumin <1.5 Intake / Output; MIVF: UOP 0.5 ml/kg/hr, drain 76m, LBM 8/22, MIVF: LR 535mhr GI Imaging: 8/6 KUB: Mild gaseous distension favors ileus  8/6 CT: concerning for iscehmic colitis; possible SBO and/or inflammatory ileus  8/7 KUB: diffuse gaseous small bowel and colonic distension 8/20 CT: extraluminal collection of gas and fluid possibly representing an anastomotic leak, postop fluid  collection, or abscess GI Surgeries / Procedures:  8/7 Ex lap, colon resection 8/9 Ex lap, intestinal anastomosis, wound closure 8/22 CT-guided anterior RLQ abscess drain placement 8/23 Ex lap planned  Central access: PICC placed 01/12/22 TPN start date:  01/24/2022-01/19/22, restarted 8/23  Nutritional Goals: RD Estimated Needs Total Energy Estimated Needs: 1700-1900 kcals Total Protein Estimated Needs: 95-115 g Total Fluid Estimated Needs: >/= 1.5 L  Current Nutrition:  NPO and TPN  (Goal TPN rate: 6535mr will provide 104g AA, 234g CHO, 61g lipids, and 1822 kCal)  Plan:  Concentrate TPN given complications with volume overload previously during admission on TPN.  Initiate TPN at 50% of goal rate at 35 ml/hr to provide 56g AA, 126g CHO, 33g lipids, and 982 kCal, meeting ~50% of needs Electrolytes in TPN: Na 135m58m, K 100mE38m Ca 6mEq/42mMg 10mEq/49mhos 30mmol/57mnd Cl:acetaFA:OZHYQMV:2. Add standard MVI and trace elements to TPN  Decrease MIVF to 20ml/hr 86m8:00 Daily BMP, Mag and Phos until stable on TPN Standard TPN labs on Mon/Thurs  Rohith Fauth JarLuisa Hart BCPS Clinical Pharmacist 01/07/2022 10:46 AM   Please refer to [facility] phone number

## 2022-01-27 NOTE — Progress Notes (Signed)
Nutrition Follow-up  DOCUMENTATION CODES:   Not applicable  INTERVENTION:   TPN to meet 100% nutritional needs; TPN order per Pharmacy  NUTRITION DIAGNOSIS:   Inadequate oral intake related to acute illness as evidenced by NPO status.  Being addressed via TPN  GOAL:   Patient will meet greater than or equal to 90% of their needs  Progressing  MONITOR:   Vent status, Labs, TF tolerance, Weight trends, Skin  REASON FOR ASSESSMENT:   Consult, Ventilator Enteral/tube feeding initiation and management  ASSESSMENT:   81 yo female admitted with acute respiratory failure related to COPD exacerbation and pneumonia, went into PEA arrest and required intubation. PMH includes A.fib, breast cancer, HTN, GERD, asthma, melanoma  7/28 Admit 7/30 IHCA x 7 minutes, Intubated 7/31 Chest tube placed, TF initiatede 8/01 Pt pulled out chest tube, CT head negative 8/02 Extubated 8/04 Cortrak placed, Cortrak pulled out by pt and replaced again, TF started 8/06 Abd pain/Abd fullness; KUB suggestive of ileus, TF on hold, Surgery consulted, CT abd with ischemic colitis, NG tube to LIS, Cortrak removed 8/07 Febrile, worsening abd pain, more somnolent, OR for ex lap and colon resection due to ischemic colon with contained perf, feculent peritonitis, abthera wound VAC placed, intubated 8/08 TPN consulted placed 8/09 Return to OR for ex lap, intestinal anastomosis, abd closure, TPN initiated 8/10 Extubated, TPN increased to goal rate 8/15 TPN weaned off  8/21 CT with right sided fluid collection, air-?anastomotic leak vs abscess vs post op change, IR consulted for drainage, Intubated 8/22 CT drain for RLQ abscess placed by IR, draining fecal contaminated fluid  IR drainage yesterday with enteric fluid confirming anastomotic leak OR today for resection of anastomosis with possible diversion with ileostomy  Pt remains on vent support  TF discontinued. TPN to be initiated today. Discussed pt  with TPN Pharmacist  Hypoglycemic this AM, should improve with TPN initiation. May need to hang dextrose infusion until TPN hung tonight at 1800 if hypoglycemia persists  Labs: sodium, potassium, calcium phosphorus and magnesium all wdl.  Meds: Kcl  Diet Order:   Diet Order             Diet NPO time specified  Diet effective now                   EDUCATION NEEDS:   Not appropriate for education at this time  Skin:  Skin Assessment: Skin Integrity Issues: Skin Integrity Issues:: Other (Comment) Wound Vac: removed Incisions: dehisced abdominal wound Other: partial thickness wound to nares of nose  Last BM:  8/22 large type 7 (prior to TF initiation)  Height:   Ht Readings from Last 1 Encounters:  01/16/2022 5' (1.524 m)    Weight:   Wt Readings from Last 1 Encounters:  01/21/2022 74.1 kg     BMI:  Body mass index is 31.9 kg/m.  Estimated Nutritional Needs:   Kcal:  1700-1900 kcals  Protein:  95-115 g  Fluid:  >/= 1.5 L   Kerman Passey MS, RDN, LDN, CNSC Registered Dietitian 3 Clinical Nutrition RD Pager and On-Call Pager Number Located in [place]

## 2022-01-27 NOTE — Transfer of Care (Signed)
Immediate Anesthesia Transfer of Care Note  Patient: ADELINA COLLARD  Procedure(s) Performed: EXPLORATORY LAPAROTOMY WITH PARTIAL COLECTOMY , iLEOSTOMY (Abdomen) LYSIS OF ADHESIONS (Abdomen)  Patient Location: ICU  Anesthesia Type:General  Level of Consciousness: sedated and Patient remains intubated per anesthesia plan  Airway & Oxygen Therapy: Patient remains intubated per anesthesia plan and Patient placed on Ventilator (see vital sign flow sheet for setting)  Post-op Assessment: Report given to RN and Post -op Vital signs reviewed and stable  Post vital signs: Reviewed and stable  Last Vitals: see ICU VS record Vitals Value Taken Time  BP    Temp    Pulse    Resp    SpO2      Last Pain:  Vitals:   01/30/2022 0800  TempSrc: Bladder  PainSc:       Patients Stated Pain Goal: 0 (92/11/94 1740)  Complications: No notable events documented.

## 2022-01-27 NOTE — Progress Notes (Signed)
[facility] Progress Note Patient Name: JERINE SURLES DOB: Jul 29, 1940 MRN: [id number]   Date of Service  01/05/2022  HPI/Events of Note  Nursing notes ETT cuff leak and patient not getting machine set TV. ETT advanced by RT and now no ETT cuff leak is appreciated and patient is now getting machine set TV. Sat = 99%.  eICU Interventions  Will order portable CXR STAT to assess ETT placement.      Intervention Category Major Interventions: Other:  Lysle Dingwall 01/24/2022, 11:42 PM

## 2022-01-27 NOTE — Op Note (Addendum)
12/23/2021 - 01/26/2022  4:01 PM  PATIENT:  [name]  81 y.o. female  PRE-OPERATIVE DIAGNOSIS: history of right colectomy;  anastomotic leak  POST-OPERATIVE DIAGNOSIS:  same  PROCEDURE:  Procedure(s): EXPLORATORY LAPAROTOMY WITH RESECTION OF DISTAL SMALL BOWEL AND ILEOCOLONIC ANASTOMOSIS; CREATION OF END ILLEOSTOMY LYSIS OF ADHESIONS X 1.5 HRS  SURGEON:  Surgeon(s): Greer Pickerel, MD  ASSISTANTS:  Ileana Roup, MD   ANESTHESIA:   general  DRAINS: Nasogastric Tube and Urinary Catheter (Foley)   LOCAL MEDICATIONS USED:  NONE  SPECIMEN:  Source of Specimen:  DISTAL SMALL BOWEL WITH ILEOCOLONIC ANASTOMOSIS  DISPOSITION OF SPECIMEN:  PATHOLOGY  COUNTS:  YES  INDICATION FOR PROCEDURE: Patient is a 81 year old female who suffered a cardiac arrest at the end of July.  She subsequently developed ischemic cecum which required laparotomy and resection of her right colon.  She was then taken back to the operating room 2 days later for reexploration at that time her bowel was viable and she underwent anastomosis between her terminal ileum and transverse colon.  She had been recovering well and an CT imaging was performed which revealed an abscess right next to her anastomosis concerning either for postoperative abscess versus anastomotic leak.  The patient underwent percutaneous drain placement which revealed that the contents were consistent with anastomotic leak.  I had extensive discussion with the patient while on the ventilator along with her son regarding best management options.  This conversation was separately documented in the medical record  PROCEDURE: Patient was taken directly from 2 heart 2 OR 1 at [facility] placed supine on the operating room table.  Her endotracheal tube was connected to the anesthesia circuit.  Sequential intervention devices were already in place.  The patient had an indwelling Foley catheter.  Full general anesthesia was established.  Her  surgical dressing was removed.  Her skin had been left open from her prior incision.  Her abdomen was prepped and draped in the usual standard surgical fashion with Betadine.  The patient was on broad-spectrum IV antibiotics.  A surgical timeout was performed.  We identified one of the central tied PDS sutures and removed it.  Then using Kocher's on each side of the fascia we carefully entered the abdominal cavity.  There was dense omental adhesions to the anterior abdominal wall.  Her fascia was very friable in certain locations especially around the umbilical area.  We are finally able to gain access to the abdominal cavity.  A Bookwalter retractor was placed.  The patient had extensive small bowel adhesions to her right lateral abdominal wall.  The percutaneous drain was encountered and there was obviously enteric contents consistent with feculent peritonitis.  The small bowel in this location was just densely matted to the right lateral abdominal wall.  We identified the transverse colon.  We identified what appeared to be the ileocolonic anastomosis in the right upper mid abdomen.  There appeared to be an anastomotic leak probably at the TA staple line in the corner.  We went about we went about dividing the colon just distal to the anastomosis with a GIA 75 stapler with a blue load.  An assistant was needed for this case due to the complexity and extreme dense adhesions and tissue retraction and manipulation.  We started mobilizing the anastomosis away from the surrounding structures with a combination of blunt dissection along with Metzenbaum scissors as well as with right ankle.  There were several enterotomies made in the distal small bowel  that had been densely adhered to the right lateral abdominal wall.  We were ultimately able to free the small bowel from the right lateral abdominal wall.  There is some thin filmy adhesions of the small bowel in the pelvis but we will do lifted out easily.   Unfortunately there were numerous small bowel enterotomies which was not an unexpected occurrence given the nature of the patient's complication.  We took down the interloop adhesions between the small bowel loops.  Most of the enterotomies were in close proximity to 1 another.  However there was probably about 6 or 7 inches of intact small bowel before the next 2 or 3 enterotomies.  Given the patient's nutritional status as well as her already having 1 anastomotic breakdown we did not feel that it was safe to do a segmental small bowel resection to preserve length.  So therefore we decided to transect small bowel just proximal to its most proximal enterotomy.  This was probably going to be about 2 feet of small bowel that was resected.  Small bowel was divided with a GIA 75 stapler with a blue load.  We then took down the mesentery sequentially with LigaSure.  This freed the specimen was passed off the field.  We then irrigated the abdomen with 3 L of saline.  We then created a circular defect in the right upper quadrant in the midportion of the rectus to create an end ileostomy.  The fascia was incised in a cruciate fashion.  The rectus was spread and the posterior fascia was incised and the and the small bowel was brought out.  There was ample length.  It was not under tension.  We then discussed placing external retention sutures in addition to a running PDS given the condition of the fascia.  We felt it would be very hard to pouch around the ileostomy with external retention so we decided to do a running looped PDS #1x2 with multiple interrupted 0 Novafil internal retention sutures.  Prior to closing the abdomen we confirmed NG tube placement in the stomach and also placed several pieces of Seprafilm. we ran the small bowel again and there is no evidence of enterotomy or serosal tear.  Moist Kerlix was placed in the midline followed by dry gauze.  We then removed the staple line of the end small bowel and  created an end ileostomy in Lafferty fashion.  Ostomy appliance was applied.  All needle, instrument counts were correct x2.  There were no immediate complications.  Patient tolerated the procedure well.  Patient was taken directly back to the ICU.  EBL 200 mL  PLAN OF CARE:  icu  PATIENT DISPOSITION:  ICU - intubated and hemodynamically stable.   Delay start of Pharmacological VTE agent (>24hrs) due to surgical blood loss or risk of bleeding:  yes  [name]. Redmond Pulling, MD, FACS General, Bariatric, & Minimally Invasive Surgery [facility], [place]

## 2022-01-27 NOTE — Progress Notes (Signed)
[facility] Progress Note Patient Name: [name] DOB: Nov 14, 1940 MRN: [id number]   Date of Service  01/08/2022  HPI/Events of Note  Hypokalemia - K+ = 3.3 and Creatinine = 0.75  eICU Interventions  Will replace K+.      Intervention Category Major Interventions: Electrolyte abnormality - evaluation and management  Lysle Dingwall 01/23/2022, 8:46 PM

## 2022-01-27 NOTE — Progress Notes (Signed)
Inpatient Rehab Admissions Coordinator:    Pt. On vent, case withdrawn from insurance for CIR. I will follow for potential admit once stable.  Clemens Catholic, [place], [place] Admissions Coordinator  [phone] ([place]) [phone] (office)

## 2022-01-27 NOTE — Progress Notes (Signed)
NAME:  [name], MRN:  [id number], DOB:  11-08-1940, LOS: 51 ADMISSION DATE:  12/31/2021, CONSULTATION DATE:  02/04/2022 REFERRING MD:  Bonner Puna CHIEF COMPLAINT:  Hypotension, abdominal pain   History of Present Illness:  81 yo female admited for CAP causing respiratory failure, had a cardiac arrest after admission and was moved to the ICU.  Chest tube placed for parapneumonic effusion, extubated on 8/2. Sent out of ICU on 8/5. 8/6 developed ischemic colitis initially treated conservatively but this morning we were called back 8/7 for worsening shock, confusion. She had exploratory laparotomy performed by general surgery on 8/7 for ischemic colitis with perforation s/p right colectomy and repeat laparotomy on 8/9 with intestinal anastomosis and abdominal closure. She was extubated on 8/11 and transferred to the general medical floor. She had CT abdomen 8/20 which should right sided abdominal fluid collection in which IR was consulted for drainage. Rapid response was called in the afternoon 8/21 for respiratory distress in which she was intubated and transferred to the ICU.   Pertinent  Medical History  Breast cancer on letrozole Atrial fibrillation  Significant Hospital Events: Including procedures, antibiotic start and stop dates in addition to other pertinent events   7/28 admit, Ceftriaxone/Azithro 7/29 last dose xeralto  7/30 IHCA x 7 min, Ceftriaxone>Cefepime 7/31 Chest tube placed, heparin gtt started 8/1 patient remove chest tube.  Head CT obtained >negative.  Severely agitated overnight.  Vancomycin stopped.  Azithromycin stopped. 8/6 Transferred to [facility] 8/7 Called CCM back for abd, Distention , hypotension and a fib with RVR, plan is for  urgent OR pending goals of care discussion then ICU post op if husband is in agreement. 8/10 Abdomen closed yesterday, SBT/SAT today 8/11 extubated yesterday, doing well on [place], up OOB, feeling alright 8/13 transferred to medical floor 8/14  chest ultrasound shows atelectasis without significant right basilar effusion. 8/21 back to ICU for resp distress  Interim History / Subjective:   No acute events overnight Patient planned to go to the OR today for anastomotic leak.  Objective   Blood pressure (!) 107/53, pulse 78, temperature (!) 97.5 F (36.4 C), resp. rate (!) 21, height 5' (1.524 m), weight 74.1 kg, SpO2 100 %.    Vent Mode: PSV;CPAP FiO2 (%):  [40 %-100 %] 40 % Set Rate:  [20 bmp] 20 bmp Vt Set:  [360 mL] 360 mL PEEP:  [5 cmH20] 5 cmH20 Pressure Support:  [8 cmH20] 8 cmH20 Plateau Pressure:  [12 cmH20-16 cmH20] 14 cmH20   Intake/Output Summary (Last 24 hours) at 01/14/2022 0931 Last data filed at 01/06/2022 0800 Gross per 24 hour  Intake 1759.79 ml  Output 1000 ml  Net 759.79 ml   Filed Weights   01/09/2022 0548 01/26/22 0500 01/24/2022 0500  Weight: 66.8 kg 72.5 kg 74.1 kg   Examination: General: elderly woman, chronically ill appearing, no acute distress, intubated/sedated HENT: [place]/AT, moist mucous membranes, sclera anicteric Lungs: course breath sounds, no weehzing Cardiovascular: rrr, no murmurs Abdomen: soft, surgical dressing in place, BS present Extremities: warm, no edema Neuro: sedated GU: foley in place  Resolved Hospital Problem list     Assessment & Plan:  Septic Shock - MAP goal 65 or higher - Wean levophed as able - In setting of intrabdominal fluid collection and possible aspiration pneumonia with new dense RLL opacity. Bedside US 8/22 not concerning for effusion. - zosyn + vancomycin for antibiotic coverage  Acute Hypoxemic Respiratory Failure PAH group 2  CAP with parapneumonic effusion, s/p  right pigtail - removed 8/1 Aspiration Pneumonia - Continue ventilator support with lung protective strategies  - Wean PEEP and FiO2 for sats greater than 90%. - Head of bed elevated 30 degrees. - Plateau pressures less than 30 cm H20.  - VAP bundle in place  - PAD protocol  - pull ET  tube back 3cm based on AM chest radiograph  Ischemic bowel with perf, s/p ex lap, intestinal anastomosis Intraabdominal fluid collection  -Concern for anastomotic leak, to OR today with gen surg -IR placed drain 8/21 with evidence of fecal matter -abx as above    Metabolic Acidosis Hypokalemia Hypomagnesemia - LR at 10m/hr - replete electrolytes as needed  Afib -holding diuretics, beta blocker in setting of shock  -hold systemic AC   Acute anemia, symptomatic  -post op, critical illness, iatrogenic losses - Trend cbc - monitor for bleeding   Severe protein calorie malnutrition  -diet per CCS   Best Practice (right click and "Reselect all SmartList Selections" daily)   Diet/type: NPO DVT prophylaxis: SCD GI prophylaxis: PPI Lines: Central line Foley:  Yes, and it is still needed Code Status:  full code Last date of multidisciplinary goals of care discussion [n/a]  Labs   CBC: Recent Labs  Lab 01/21/22 0513 01/23/22 0530 01/24/22 0926 01/24/22 1040 01/28/2022 0530 01/10/2022 1428 01/24/2022 1701 01/26/22 0000 01/26/22 0420  WBC 9.5   < > 14.9* 17.5* 9.3 9.1  --  5.9  --   NEUTROABS 7.1  --   --  14.7*  --  7.0  --   --   --   HGB 7.6*   < > 6.3* 7.8* 7.0* 6.9* 7.1* 7.1* 7.5*  HCT 23.2*   < > 19.7* 25.0* 22.8* 23.1* 21.0* 22.5* 22.0*  MCV 104.0*   < > 110.1* 110.6* 113.4* 113.8*  --  104.2*  --   PLT 294   < > 297 328 365 379  --  271  --    < > = values in this interval not displayed.    Basic Metabolic Panel: Recent Labs  Lab 01/22/22 0513 01/23/22 0530 01/24/22 0432 01/28/2022 0530 01/26/2022 1428 02/02/2022 1701 01/26/22 0000 01/26/22 0420 01/26/22 1000 01/26/22 2007  NA 138 141 134* 141 139 142 142 142  --   --   K 3.8 4.6 3.4* 4.6 4.9 4.4 3.1* 4.2  --   --   CL 98 99 101 109 110  --  118*  --   --   --   CO2 35* 34* '26 27 23  '$ --  20*  --   --   --   GLUCOSE 106* 118* 105* 86 75  --  127*  --   --   --   BUN 17 20 26* 27* 29*  --  24*  --   --   --    CREATININE 0.70 0.88 1.00 0.83 1.20*  --  0.86  --   --   --   CALCIUM 9.3 9.5 8.2* 8.9 9.0  --  7.1*  --   --   --   MG 1.5* 1.7  --   --   --   --  1.2*  --   --  2.4  PHOS  --   --   --   --   --   --   --   --  2.6  --    GFR: Estimated Creatinine Clearance: 48.4 mL/min (by C-G formula based on SCr  of 0.86 mg/dL). Recent Labs  Lab 01/23/22 1658 01/23/22 1730 01/24/22 1040 01/23/2022 0530 02/03/2022 1428 01/20/2022 1559 01/17/2022 1619 01/26/2022 1728 01/26/22 0000 01/26/22 0440 01/30/2022 0348  PROCALCITON  --    < >  --  0.56  --   --  0.47  --  0.33  --  0.20  WBC  --    < > 17.5* 9.3 9.1  --   --   --  5.9  --   --   LATICACIDVEN 1.1  --   --   --   --  0.7  --  0.8  --  1.0  --    < > = values in this interval not displayed.    Liver Function Tests: Recent Labs  Lab 01/24/22 0432 01/30/2022 1428 01/26/22 0000  AST '23 22 17  '$ ALT '25 27 21  '$ ALKPHOS 64 66 49  BILITOT 1.2 1.2 0.8  PROT 4.9* 5.8* 4.3*  ALBUMIN 1.8* 2.1* <1.5*   No results for input(s): "LIPASE", "AMYLASE" in the last 168 hours. No results for input(s): "AMMONIA" in the last 168 hours.  ABG    Component Value Date/Time   PHART 7.421 01/26/2022 0420   PCO2ART 32.2 01/26/2022 0420   PO2ART 89 01/26/2022 0420   HCO3 21.2 01/26/2022 0420   TCO2 22 01/26/2022 0420   ACIDBASEDEF 3.0 (H) 01/26/2022 0420   O2SAT 98 01/26/2022 0420     Coagulation Profile: No results for input(s): "INR", "PROTIME" in the last 168 hours.  Cardiac Enzymes: No results for input(s): "CKTOTAL", "CKMB", "CKMBINDEX", "TROPONINI" in the last 168 hours.  HbA1C: Hgb A1c MFr Bld  Date/Time Value Ref Range Status  01/03/2022 09:18 PM 4.9 4.8 - 5.6 % Final    Comment:    (NOTE) Pre diabetes:          5.7%-6.4%  Diabetes:              >6.4%  Glycemic control for   <7.0% adults with diabetes     CBG: Recent Labs  Lab 01/10/2022 0004 01/16/2022 0353 01/28/2022 0821 01/20/2022 0849 01/15/2022 0925  GLUCAP 106* 106* 54* 59* 128*     Critical care time: 35 minutes    Freda Jackson, MD [facility] Office: [phone]   See Amion for personal pager PCCM on call pager [phone] until 7pm. Please call Elink 7p-7a. [phone]

## 2022-01-27 NOTE — Anesthesia Procedure Notes (Signed)
Date/Time: 01/12/2022 12:57 PM  Performed by: Leonor Liv, CRNAPre-anesthesia Checklist: Patient identified, Emergency Drugs available, Suction available, Patient being monitored and Timeout performed Induction Type: Inhalational induction with existing ETT Placement Confirmation: positive ETCO2 and breath sounds checked- equal and bilateral Dental Injury: Teeth and Oropharynx as per pre-operative assessment

## 2022-01-27 NOTE — Progress Notes (Addendum)
[facility] Progress Note  9 Days Post-Op  Subjective: Intubated.  Minimal pain.  Awake and alert  Objective: Vital signs in last 24 hours: Temp:  [97.2 F (36.2 C)-99 F (37.2 C)] 97.5 F (36.4 C) (08/23 0630) Pulse Rate:  [52-91] 72 (08/23 0630) Resp:  [15-26] 18 (08/23 0630) BP: (79-145)/(42-100) 111/66 (08/23 0630) SpO2:  [91 %-100 %] 100 % (08/23 0630) FiO2 (%):  [40 %-100 %] 40 % (08/23 0400) Weight:  [74.1 kg] 74.1 kg (08/23 0500) Last BM Date : 01/26/22  Intake/Output from previous day: 08/22 0701 - 08/23 0700 In: 1801.2 [I.V.:1316.7; NG/GT:147; IV Piggyback:327.5] Out: 1015 [Urine:950; Drains:65] Intake/Output this shift: No intake/output data recorded.  PE: Gen:  Alert, ill appearing elderly female Card:  Regular rate and rhythm Pulm:  intubated  Abd: Soft, minimal distention, midline wound with pink viable tissue and in-tact fascia as below, IR drain with bowel contents present in drain  Lab Results:  Recent Labs    01/19/2022 1428 02/04/2022 1701 01/26/22 0000 01/26/22 0420  WBC 9.1  --  5.9  --   HGB 6.9*   < > 7.1* 7.5*  HCT 23.1*   < > 22.5* 22.0*  PLT 379  --  271  --    < > = values in this interval not displayed.   BMET Recent Labs    01/17/2022 1428 01/19/2022 1701 01/26/22 0000 01/26/22 0420  NA 139   < > 142 142  K 4.9   < > 3.1* 4.2  CL 110  --  118*  --   CO2 23  --  20*  --   GLUCOSE 75  --  127*  --   BUN 29*  --  24*  --   CREATININE 1.20*  --  0.86  --   CALCIUM 9.0  --  7.1*  --    < > = values in this interval not displayed.   PT/INR No results for input(s): "LABPROT", "INR" in the last 72 hours. CMP     Component Value Date/Time   NA 142 01/26/2022 0420   NA 139 11/18/2021 1432   NA 134 (L) 04/25/2017 1216   K 4.2 01/26/2022 0420   K 4.3 04/25/2017 1216   CL 118 (H) 01/26/2022 0000   CO2 20 (L) 01/26/2022 0000   CO2 29 04/25/2017 1216   GLUCOSE 127 (H) 01/26/2022 0000   GLUCOSE 85 04/25/2017 1216   BUN  24 (H) 01/26/2022 0000   BUN 10 11/18/2021 1432   BUN 14.4 04/25/2017 1216   CREATININE 0.86 01/26/2022 0000   CREATININE 0.72 01/17/2018 1347   CREATININE 0.7 04/25/2017 1216   CALCIUM 7.1 (L) 01/26/2022 0000   CALCIUM 10.0 04/25/2017 1216   PROT 4.3 (L) 01/26/2022 0000   PROT 7.2 04/25/2017 1216   ALBUMIN <1.5 (L) 01/26/2022 0000   ALBUMIN 3.7 04/25/2017 1216   AST 17 01/26/2022 0000   AST 20 01/17/2018 1347   AST 21 04/25/2017 1216   ALT 21 01/26/2022 0000   ALT 10 01/17/2018 1347   ALT 10 04/25/2017 1216   ALKPHOS 49 01/26/2022 0000   ALKPHOS 75 04/25/2017 1216   BILITOT 0.8 01/26/2022 0000   BILITOT 0.5 01/17/2018 1347   BILITOT 0.65 04/25/2017 1216   GFRNONAA >60 01/26/2022 0000   GFRNONAA >60 01/17/2018 1347   GFRAA 90 04/24/2020 1235   GFRAA >60 01/17/2018 1347   Lipase     Component Value Date/Time   LIPASE 77 07/28/2008  1630       Studies/Results: CT GUIDED VISCERAL FLUID DRAIN BY PERC CATH  Result Date: 01/26/2022 INDICATION: Right lower quadrant abscess by CT EXAM: CT DRAINAGE ANTERIOR RIGHT LOWER QUADRANT ABSCESS Date:  01/26/2022 01/26/2022 3:25 pm Radiologist:  Jerilynn Mages. Daryll Brod, MD Guidance:  CT FLUOROSCOPY: None. MEDICATIONS: 1% lidocaine local ANESTHESIA/SEDATION: Moderate (conscious) sedation was employed during this procedure. A total of Versed 1.0 mg and Fentanyl 25 mcg was administered intravenously. Moderate Sedation Time: 17 minutes. The patient's level of consciousness and vital signs were monitored continuously by radiology nursing throughout the procedure under my direct supervision. CONTRAST:  None. COMPLICATIONS: None immediate. PROCEDURE: Informed consent was obtained from the patient following explanation of the procedure, risks, benefits and alternatives. The patient understands, agrees and consents for the procedure. All questions were addressed. A time out was performed. Maximal barrier sterile technique utilized including caps, mask, sterile  gowns, sterile gloves, large sterile drape, hand hygiene, and Betadine. Previous imaging reviewed. Patient positioned supine. Noncontrast localization CT performed. The anterior right lower quadrant air-fluid collection below the bowel anastomosis was localized and compared to the prior study. Air-fluid collection now contains oral contrast when compared to 01/24/2022 compatible with anastomotic leak. Under sterile conditions and local anesthesia, an 18 gauge 10 cm access needle was advanced from an anterolateral approach into the air-fluid collection. Needle position confirmed with CT. Syringe aspiration yielded fecal contaminated fluid. Guidewire inserted followed by tract dilatation to insert a 10 [place] drain. Drain catheter position confirmed with CT. Abscess decompressed by syringe aspiration. 30 cc fecal contaminated fluid aspirated. G stain and culture sent. Catheter secured with a Prolene suture and connected to external suction bulb. Sterile dressing applied. No immediate complication. Patient tolerated the procedure well. IMPRESSION: Successful CT-guided anterior right lower quadrant abscess drain placement. Electronically Signed   By: Jerilynn Mages.  Shick M.D.   On: 01/26/2022 15:46   DG CHEST PORT 1 VIEW  Result Date: 01/26/2022 CLINICAL DATA:  Endotracheal tube.  Respiratory distress. EXAM: PORTABLE CHEST 1 VIEW COMPARISON:  01/10/2022 FINDINGS: Endotracheal tube is in place, tip approximately 1.2 centimeters above the carina, directed towards the RIGHT mainstem bronchus. Consider withdrawing endotracheal tube 1.5 centimeters. Nasogastric tube side port overlies the level of proximal stomach. LEFT-sided PICC line tip overlies the superior vena cava. Heart margins are obscured. There is dense opacity throughout the LOWER portion of the RIGHT lung, significantly increased compared with prior study. There is residual aerated lung at the RIGHT lung apex. There is subsegmental atelectasis at the MEDIAL LEFT lung  base partially obscuring the hemidiaphragm. IMPRESSION: Significant increase in RIGHT pulmonary opacity. Endotracheal tube is directed towards the RIGHT mainstem bronchus, just above the carina. Consider withdrawing endotracheal tube 5 centimeters and reassessing position. These results will be called to the ordering clinician or representative by the Radiologist Assistant, and communication documented in the PACS or [employer]. Electronically Signed   By: Nolon Nations M.D.   On: 01/26/2022 08:25   DG Chest Port 1 View  Result Date: 01/12/2022 CLINICAL DATA:  Intubation EXAM: PORTABLE CHEST 1 VIEW COMPARISON:  Portable exam 1526 hours compared to 01/22/2022 FINDINGS: Tip of endotracheal tube projects 1.9 cm above carina. Nasogastric tube extends into stomach. LEFT arm PICC line tip projects over RIGHT atrium; recommend withdrawal 4 cm. Normal heart size, mediastinal contours, and pulmonary vascularity. Persistent RIGHT basilar opacity consistent with effusion and atelectasis versus infiltrate. LEFT basilar atelectasis, slightly increased. No pneumothorax. IMPRESSION: Recommend withdrawal of RIGHT arm PICC line 4  cm. Persistent RIGHT pleural effusion and basilar atelectasis versus infiltrate with slightly increased LEFT basilar atelectasis. Electronically Signed   By: Lavonia Dana M.D.   On: 01/10/2022 15:35    Anti-infectives: Anti-infectives (From admission, onward)    Start     Dose/Rate Route Frequency Ordered Stop   01/26/22 1400  piperacillin-tazobactam (ZOSYN) IVPB 3.375 g        3.375 g 12.5 mL/hr over 240 Minutes Intravenous Every 8 hours 01/26/22 0945     01/22/2022 1030  vancomycin (VANCOREADY) IVPB 750 mg/150 mL        750 mg 150 mL/hr over 60 Minutes Intravenous Every 24 hours 01/20/2022 0937     01/12/2022 1000  vancomycin (VANCOREADY) IVPB 1500 mg/300 mL  Status:  Discontinued        1,500 mg 150 mL/hr over 120 Minutes Intravenous Every 48 hours 01/23/22 0909 01/26/2022 0937    01/23/22 1030  vancomycin (VANCOREADY) IVPB 1250 mg/250 mL        1,250 mg 166.7 mL/hr over 90 Minutes Intravenous  Once 01/23/22 0907 01/23/22 1259   01/23/22 1000  metroNIDAZOLE (FLAGYL) IVPB 500 mg  Status:  Discontinued        500 mg 100 mL/hr over 60 Minutes Intravenous Every 12 hours 01/23/22 0839 01/26/22 0945   01/23/22 1000  ceFEPIme (MAXIPIME) 2 g in sodium chloride 0.9 % 100 mL IVPB  Status:  Discontinued        2 g 200 mL/hr over 30 Minutes Intravenous Every 12 hours 01/23/22 0901 01/26/22 0945   01/30/2022 1400  micafungin (MYCAMINE) 100 mg in sodium chloride 0.9 % 100 mL IVPB  Status:  Discontinued        100 mg 105 mL/hr over 1 Hours Intravenous Every 24 hours 02/01/2022 1302 01/15/22 1211   01/10/22 1515  piperacillin-tazobactam (ZOSYN) IVPB 3.375 g        3.375 g 12.5 mL/hr over 240 Minutes Intravenous Every 8 hours 01/10/22 1427 01/31/2022 2359   01/04/22 2000  vancomycin (VANCOCIN) IVPB 1000 mg/200 mL premix  Status:  Discontinued        1,000 mg 200 mL/hr over 60 Minutes Intravenous Every 24 hours 01/03/22 1910 01/05/22 1321   01/03/22 2200  ceFEPIme (MAXIPIME) 2 g in sodium chloride 0.9 % 100 mL IVPB        2 g 200 mL/hr over 30 Minutes Intravenous Every 12 hours 01/03/22 1910 01/08/22 2209   01/03/22 1930  vancomycin (VANCOREADY) IVPB 1250 mg/250 mL        1,250 mg 166.7 mL/hr over 90 Minutes Intravenous  Once 01/03/22 1910 01/03/22 2219   01/02/22 2100  azithromycin (ZITHROMAX) 500 mg in sodium chloride 0.9 % 250 mL IVPB        500 mg 250 mL/hr  Intravenous Every 24 hours 12/23/2021 2248 01/06/22 2149   01/02/22 2000  cefTRIAXone (ROCEPHIN) 2 g in sodium chloride 0.9 % 100 mL IVPB  Status:  Discontinued        2 g 200 mL/hr over 30 Minutes Intravenous Every 24 hours 12/06/2021 2248 01/03/22 1733   12/09/2021 2030  cefTRIAXone (ROCEPHIN) 1 g in sodium chloride 0.9 % 100 mL IVPB        1 g 200 mL/hr over 30 Minutes Intravenous  Once 12/11/2021 2025 12/06/2021 2141   12/13/2021  2030  azithromycin (ZITHROMAX) 500 mg in sodium chloride 0.9 % 250 mL IVPB        500 mg 250 mL/hr over 60 Minutes Intravenous  Once 12/23/2021 2025 12/30/2021 2312        Assessment/Plan POD 16/14, S/p exploratory laparotomy,  R colectomy, abthera placement 01/14/2022 Dr. Rosendo Gros S/p exploratory laparotomy, intestinal anastomosis, abdominal closure 01/26/2022 Dr. Rosendo Gros  for ischemic bowel with perforation -IR drain with enteric contents present confirming anastomotic leak -plan to return to OR today for resection of anastomosis and possible diversion with ileostomy.  This was discussed in detail with the patient, including risks and complications, who nods her head yes to acknowledgement and agreement with what we have proposed.   -tried to call husband on cell and home phone with no answer.  Left a VM that we would try to call him back. - wet-to-dry dressing to midline BID  -resume TNA given recurrent OR today and inability to use GI tract currently.   FEN: NPO /OGT/restart TNA  ID: Zosyn (stop date 8/14), cefepime/flagyl 8/19 >>8/21, Vanc/zosyn 8/21 --> VTE: SCD's, DOAC held Foley: in place for strict I&Os Dispo: ICU   Below per TRH -- Septic shock due to above Acute hypoxic respiratory failure Cardiac arrest 7/30, 7 mins CPR Hx cardiomyopathy COPD Afib, permanent CAP     LOS: 26 days   Henreitta Cea, [facility] Please see Amion for pager number during day hours 7:00am-4:30pm

## 2022-01-27 NOTE — Anesthesia Procedure Notes (Signed)
Central Venous Catheter Insertion Performed by: Pervis Hocking, DO, anesthesiologist Start/End08/28/2023 1:20 PM, 01/30/2022 1:30 PM Patient location: Pre-op. Preanesthetic checklist: patient identified, IV checked, site marked, risks and benefits discussed, surgical consent, monitors and equipment checked, pre-op evaluation, timeout performed and anesthesia consent Lidocaine 1% used for infiltration and patient sedated Hand hygiene performed  and maximum sterile barriers used  Catheter size: 8 Fr Total catheter length 16. Central line was placed.Double lumen Procedure performed using ultrasound guided technique. Ultrasound Notes:anatomy identified, needle tip was noted to be adjacent to the nerve/plexus identified, no ultrasound evidence of intravascular and/or intraneural injection and image(s) printed for medical record Attempts: 1 Following insertion, dressing applied and line sutured. Post procedure assessment: blood return through all ports  Patient tolerated the procedure well with no immediate complications.

## 2022-01-27 NOTE — Anesthesia Preprocedure Evaluation (Addendum)
Anesthesia Evaluation  Patient identified by MRN, date of birth, ID band Patient unresponsive    Reviewed: Allergy & Precautions, NPO status , Patient's Chart, lab work & pertinent test results, reviewed documented beta blocker date and time   Airway Mallampati: Intubated  TM Distance: >3 FB Neck ROM: Full    Dental  (+) Dental Advisory Given   Pulmonary neg pulmonary ROS,    Pulmonary exam normal breath sounds clear to auscultation       Cardiovascular hypertension, Pt. on medications and Pt. on home beta blockers pulmonary hypertension (severe pHTN)+CHF (moderate RV failure)  Normal cardiovascular exam+ Valvular Problems/Murmurs (mild to mod AI) AI  Rhythm:Regular Rate:Normal  Echo 01/12/2022 1. Left ventricular ejection fraction, by estimation, is 60 to 65%. Left  ventricular ejection fraction by 2D MOD biplane is 60.6 %. The left  ventricle has normal function. The left ventricle has no regional wall  motion abnormalities. There is mild left  ventricular hypertrophy. Left ventricular diastolic function could not be  evaluated.  2. Right ventricular systolic function is moderately reduced. The right  ventricular size is normal. There is severely elevated pulmonary artery  systolic pressure. The estimated right ventricular systolic pressure is  47.6 mmHg.  3. The mitral valve is grossly normal. Trivial mitral valve  regurgitation.  4. The aortic valve is tricuspid. Aortic valve regurgitation is mild to  moderate. Aortic valve sclerosis is present, with no evidence of aortic  valve stenosis. Aortic regurgitation PHT measures 227 msec.  5. The inferior vena cava is normal in size with <50% respiratory  variability, suggesting right atrial pressure of 8 mmHg.    Neuro/Psych PSYCHIATRIC DISORDERS Anxiety negative neurological ROS     GI/Hepatic Neg liver ROS, GERD  Controlled,POD 16/14,S/p exploratory laparotomy, R  colectomy,  S/p exploratory laparotomy, intestinal anastomosis, abdominal closure 01/31/2022 Dr. Rosendo Gros for ischemic bowel with perforation  IR drain placed into RLQ collection yesterday [facility] note today: -IR drain with enteric contents present confirming anastomotic leak   Endo/Other  negative endocrine ROS  Renal/GU negative Renal ROS  negative genitourinary   Musculoskeletal  (+) Arthritis , Osteoarthritis,    Abdominal   Peds  Hematology  (+) Blood dyscrasia, anemia , Hb 7.8   Anesthesia Other Findings   Reproductive/Obstetrics negative OB ROS                           Anesthesia Physical Anesthesia Plan  ASA: 4  Anesthesia Plan: General   Post-op Pain Management:    Induction: Intravenous  PONV Risk Score and Plan: 4 or greater and Treatment may vary due to age or medical condition  Airway Management Planned: Oral ETT  Additional Equipment: Arterial line and CVP  Intra-op Plan:   Post-operative Plan: Post-operative intubation/ventilation  Informed Consent: I have reviewed the patients History and Physical, chart, labs and discussed the procedure including the risks, benefits and alternatives for the proposed anesthesia with the patient or authorized representative who has indicated his/her understanding and acceptance.       Plan Discussed with: CRNA  Anesthesia Plan Comments: (Will return intubated to ICU Currently on levophed through LUE PICC, PIVx1 infiltrated Will placed central line and arterial line  Hb 7.8 starting, will start 1 unit of blood with incision )        Anesthesia Quick Evaluation

## 2022-01-27 NOTE — Progress Notes (Signed)
                                                                                                                                                                                                          Palliative Medicine Progress Note   Patient Name: [name]       Date: 01/10/2022 DOB: [birth year]  Age: 81 y.o. MRN#: [id number] Attending Physician: Freddi Starr, MD Primary Care Physician: [facility] [place] Admit Date: 12/28/2021  Reason for Consultation/Follow-up: {Reason for Consult:23484}  HPI/Patient Profile: ***  Subjective: ***  Objective:  Physical Exam          Vital Signs: BP (!) 116/54   Pulse 63   Temp 98.4 F (36.9 C)   Resp 20   Ht 5' (1.524 m)   Wt 74.1 kg   SpO2 100%   BMI 31.90 kg/m  SpO2: SpO2: 100 % O2 Device: O2 Device: Ventilator O2 Flow Rate: O2 Flow Rate (L/min): 8 L/min  Intake/output summary:  Intake/Output Summary (Last 24 hours) at 01/10/2022 1535 Last data filed at 01/12/2022 1509 Gross per 24 hour  Intake 2262.43 ml  Output 1350 ml  Net 912.43 ml    LBM: Last BM Date : 01/26/22     Palliative Assessment/Data: ***     Palliative Medicine Assessment & Plan   Assessment: Principal Problem:   CAP (community acquired pneumonia) Active Problems:   Hypertensive heart disease without CHF   Hypotension   A-fib (HCC)   Lactic acid increased   Cardiac arrest ([place])   Acute respiratory failure with hypoxia (HCC)   Pneumonia of right lower lobe due to infectious organism   Takotsubo cardiomyopathy   Ischemic colitis ([place])   Septic shock (HCC)   Pressure injury of skin    Recommendations/Plan: ***  Goals of Care and Additional Recommendations: Limitations on Scope of Treatment: {Recommended Scope and Preferences:21019}  Code Status:   Prognosis:  {Palliative Care Prognosis:23504}  Discharge Planning: {Palliative dispostion:23505}  Care plan was discussed with ***  Thank you for  allowing the Palliative Medicine Team to assist in the care of this patient.   ***   Lavena Bullion, NP   Please contact Palliative Medicine Team phone at [phone] for questions and concerns.  For individual providers, please see AMION.

## 2022-01-27 NOTE — Progress Notes (Signed)
OT Cancellation Note  Patient Details Name: [name] MRN: [id number] DOB: 10-16-1940   Cancelled Treatment:    Reason Eval/Treat Not Completed: Patient at procedure or test/ unavailable.  Patient scheduled for additional procedure this date.  OT will hold this date and continue efforts as appropriate.  Goals were set to expire 8/23, OT pushed the date out one week, and will look to re-assess POC and goals once she is appropriate for treatment.    Kayleigh Broadwell D Rickesha Veracruz 01/10/2022, 10:25 AM 01/26/2022  RP, OTR/L  Acute Rehabilitation Services  Office:  [phone]

## 2022-01-27 NOTE — Anesthesia Procedure Notes (Signed)
Arterial Line Insertion Start/End08/30/2023 1:00 PM, 01/26/2022 1:15 PM Performed by: Carolan Clines, CRNA, CRNA  Patient location: OR. Preanesthetic checklist: patient identified, IV checked, site marked, risks and benefits discussed, surgical consent, monitors and equipment checked, pre-op evaluation, timeout performed and anesthesia consent Patient sedated Right, radial was placed Catheter size: 20 G Hand hygiene performed  and maximum sterile barriers used  Allen's test indicative of satisfactory collateral circulation Attempts: 2 Procedure performed without using ultrasound guided technique. Following insertion, dressing applied and Biopatch. Patient tolerated the procedure well with no immediate complications. Additional procedure comments: Placed under anesthesia.

## 2022-01-27 NOTE — Progress Notes (Signed)
Referring Physician(s): Dr Rosendo Gros  Supervising Physician: Sandi Mariscal  Patient Status:  [facility] - In-pt  Chief Complaint:  RLQ abscess drain placed in IR 8/22  Subjective:  POD 16/14, S/p exploratory laparotomy,  R colectomy,  S/p exploratory laparotomy, intestinal anastomosis, abdominal closure 01/12/2022 Dr. Rosendo Gros  for ischemic bowel with perforation  IR drain placed into RLQ collection yesterday CCS note today: -IR drain with enteric contents present confirming anastomotic leak -plan to return to OR today for resection of anastomosis and possible diversion with ileostomy.   Allergies: Chlorhexidine gluconate, Doxycycline, Codeine, Doxycycline, Nsaids, Nsaids, Tylenol [acetaminophen], Adhesive [tape], Chlorhexidine, Codeine, and Tylenol [acetaminophen]  Medications: Prior to Admission medications   Medication Sig Start Date End Date Taking? Authorizing Provider  albuterol (VENTOLIN HFA) 108 (90 Base) MCG/ACT inhaler Inhale 1 puff into the lungs every 6 (six) hours as needed for wheezing or shortness of breath.   Yes [provider]  ALPRAZolam Duanne Moron) 0.5 MG tablet Take 0.5 mg by mouth daily as needed for anxiety.    Yes [provider]  B Complex Vitamins (VITAMIN B COMPLEX PO) Take 1 capsule by mouth daily.   Yes [provider]  BREO ELLIPTA 100-25 MCG/INH AEPB Inhale 1 puff into the lungs daily. 12/22/16  Yes [provider]  COLLAGEN PO Take 1 capsule by mouth daily.   Yes [provider]  desonide (DESOWEN) 0.05 % cream Apply 1 application topically 2 (two) times daily as needed (for skin irritation.).   Yes [provider]  diclofenac sodium (VOLTAREN) 1 % GEL Apply 2 g topically daily as needed (pain).    Yes [provider]  EPINEPHrine 0.3 mg/0.3 mL IJ SOAJ injection Inject 0.3 mg into the muscle as needed for anaphylaxis.   Yes [provider]  famotidine (PEPCID) 20 MG tablet Take 1 tablet by  mouth as directed. 06/29/19  Yes [provider]  fluorouracil (EFUDEX) 5 % cream Apply 1 application  topically daily. Per MD instructions, for skin cancer 12/03/20  Yes [provider]  fluticasone (FLONASE) 50 MCG/ACT nasal spray Place 2 sprays into both nostrils daily.   Yes [provider]  irbesartan (AVAPRO) 150 MG tablet Take 1 tablet (150 mg total) by mouth daily. 04/10/21  Yes Lelon Perla, MD  letrozole [facility]) 2.5 MG tablet TAKE 1 TABLET BY MOUTH EVERY DAY 05/29/21  Yes Magrinat, Virgie Dad, MD  levocetirizine (XYZAL) 5 MG tablet Take 5 mg by mouth at bedtime. 09/04/20  Yes [provider]  Methylsulfonylmethane (MSM PO) Take 1 capsule by mouth daily.   Yes [provider]  MILK THISTLE PO Take 1 capsule by mouth daily.   Yes [provider]  Misc Natural Products (PYCNOGENOL COMPLEX PO) Take 1 capsule by mouth daily.   Yes [provider]  Omega-3 Fatty Acids (OMEGA 3 PO) Take 1 capsule by mouth daily.   Yes [provider]  OVER THE COUNTER MEDICATION Take 1 capsule by mouth daily. Immunity supplement   Yes [provider]  oxyCODONE (ROXICODONE) 5 MG immediate release tablet Take 1 tablet (5 mg total) by mouth every 4 (four) hours as needed for severe pain. 09/02/21  Yes Gareth Morgan, MD  Polyethyl Glycol-Propyl Glycol (SYSTANE OP) Place 1 drop into both eyes daily.   Yes [provider]  potassium chloride (KLOR-CON) 20 MEQ packet Take 20 mEq by mouth daily.   Yes [provider]  propranolol ER (INDERAL LA) 80 MG 24  hr capsule Take 80 mg by mouth daily.   Yes [provider]  TURMERIC PO Take 1 capsule by mouth daily.   Yes [provider]  venlafaxine XR (EFFEXOR-XR) 75 MG 24 hr capsule TAKE 1 CAPSULE BY MOUTH DAILY WITH BREAKFAST. 12/30/21  Yes Iruku, Arletha Pili, MD  VITAMIN D PO Take 1 capsule by mouth daily.   Yes [provider]  XARELTO 20 MG TABS  tablet TAKE 1 TABLET BY MOUTH DAILY WITH SUPPER Patient taking differently: Take 20 mg by mouth daily. 09/29/21  Yes Vickie Epley, MD     Vital Signs: BP (!) 119/52   Pulse 75   Temp 98.2 F (36.8 C)   Resp 19   Ht 5' (1.524 m)   Wt 163 lb 5.8 oz (74.1 kg)   SpO2 100%   BMI 31.90 kg/m   Physical Exam Vitals reviewed.  Skin:    General: Skin is warm.     Comments: Site of drain is clean and dry OP dark brown fluid      Imaging: CT GUIDED VISCERAL FLUID DRAIN BY PERC CATH  Result Date: 01/26/2022 INDICATION: Right lower quadrant abscess by CT EXAM: CT DRAINAGE ANTERIOR RIGHT LOWER QUADRANT ABSCESS Date:  01/26/2022 01/26/2022 3:25 pm Radiologist:  Jerilynn Mages. Daryll Brod, MD Guidance:  CT FLUOROSCOPY: None. MEDICATIONS: 1% lidocaine local ANESTHESIA/SEDATION: Moderate (conscious) sedation was employed during this procedure. A total of Versed 1.0 mg and Fentanyl 25 mcg was administered intravenously. Moderate Sedation Time: 17 minutes. The patient's level of consciousness and vital signs were monitored continuously by radiology nursing throughout the procedure under my direct supervision. CONTRAST:  None. COMPLICATIONS: None immediate. PROCEDURE: Informed consent was obtained from the patient following explanation of the procedure, risks, benefits and alternatives. The patient understands, agrees and consents for the procedure. All questions were addressed. A time out was performed. Maximal barrier sterile technique utilized including caps, mask, sterile gowns, sterile gloves, large sterile drape, hand hygiene, and Betadine. Previous imaging reviewed. Patient positioned supine. Noncontrast localization CT performed. The anterior right lower quadrant air-fluid collection below the bowel anastomosis was localized and compared to the prior study. Air-fluid collection now contains oral contrast when compared to 01/24/2022 compatible with anastomotic leak. Under sterile conditions and local  anesthesia, an 18 gauge 10 cm access needle was advanced from an anterolateral approach into the air-fluid collection. Needle position confirmed with CT. Syringe aspiration yielded fecal contaminated fluid. Guidewire inserted followed by tract dilatation to insert a 10 [place] drain. Drain catheter position confirmed with CT. Abscess decompressed by syringe aspiration. 30 cc fecal contaminated fluid aspirated. G stain and culture sent. Catheter secured with a Prolene suture and connected to external suction bulb. Sterile dressing applied. No immediate complication. Patient tolerated the procedure well. IMPRESSION: Successful CT-guided anterior right lower quadrant abscess drain placement. Electronically Signed   By: Jerilynn Mages.  Shick M.D.   On: 01/26/2022 15:46   DG CHEST PORT 1 VIEW  Result Date: 01/26/2022 CLINICAL DATA:  Endotracheal tube.  Respiratory distress. EXAM: PORTABLE CHEST 1 VIEW COMPARISON:  01/29/2022 FINDINGS: Endotracheal tube is in place, tip approximately 1.2 centimeters above the carina, directed towards the RIGHT mainstem bronchus. Consider withdrawing endotracheal tube 1.5 centimeters. Nasogastric tube side port overlies the level of proximal stomach. LEFT-sided PICC line tip overlies the superior vena cava. Heart margins are obscured. There is dense opacity throughout the LOWER portion of the RIGHT lung, significantly increased compared with prior study. There is residual aerated lung at the RIGHT  lung apex. There is subsegmental atelectasis at the MEDIAL LEFT lung base partially obscuring the hemidiaphragm. IMPRESSION: Significant increase in RIGHT pulmonary opacity. Endotracheal tube is directed towards the RIGHT mainstem bronchus, just above the carina. Consider withdrawing endotracheal tube 5 centimeters and reassessing position. These results will be called to the ordering clinician or representative by the Radiologist Assistant, and communication documented in the PACS or [employer].  Electronically Signed   By: Nolon Nations M.D.   On: 01/26/2022 08:25   DG Chest Port 1 View  Result Date: 01/31/2022 CLINICAL DATA:  Intubation EXAM: PORTABLE CHEST 1 VIEW COMPARISON:  Portable exam 1526 hours compared to 01/22/2022 FINDINGS: Tip of endotracheal tube projects 1.9 cm above carina. Nasogastric tube extends into stomach. LEFT arm PICC line tip projects over RIGHT atrium; recommend withdrawal 4 cm. Normal heart size, mediastinal contours, and pulmonary vascularity. Persistent RIGHT basilar opacity consistent with effusion and atelectasis versus infiltrate. LEFT basilar atelectasis, slightly increased. No pneumothorax. IMPRESSION: Recommend withdrawal of RIGHT arm PICC line 4 cm. Persistent RIGHT pleural effusion and basilar atelectasis versus infiltrate with slightly increased LEFT basilar atelectasis. Electronically Signed   By: Lavonia Dana M.D.   On: 01/17/2022 15:35   CT ABDOMEN PELVIS W CONTRAST  Result Date: 01/24/2022 CLINICAL DATA:  Abdominal pain, acute, nonlocalized EXAM: CT ABDOMEN AND PELVIS WITH CONTRAST TECHNIQUE: Multidetector CT imaging of the abdomen and pelvis was performed using the standard protocol following bolus administration of intravenous contrast. RADIATION DOSE REDUCTION: This exam was performed according to the departmental dose-optimization program which includes automated exposure control, adjustment of the mA and/or kV according to patient size and/or use of iterative reconstruction technique. CONTRAST:  148m OMNIPAQUE IOHEXOL 300 MG/ML  SOLN COMPARISON:  01/10/2022 FINDINGS: Lower chest: Focal consolidation within the basilar right middle lobe is again seen, not fully assessed on this examination. Moderate right pleural effusion appears slightly enlarged since prior examination with compressive atelectasis of the right lower lobe. Cardiac size is mildly enlarged. Central venous catheter tip noted within the right atrium. Bilateral breast implants are noted.  Surgical changes of right mastectomy are suspected. Hepatobiliary: No focal liver abnormality is seen. No gallstones, gallbladder wall thickening, or biliary dilatation. Pancreas: Unremarkable Spleen: Unremarkable Adrenals/Urinary Tract: The adrenal glands are unremarkable. The kidneys are normal. The bladder is largely decompressed. Stomach/Bowel: Mild descending and sigmoid colonic diverticulosis without superimposed acute inflammatory change. Surgical changes of right hemicolectomy are identified. A extraluminal collection of gas and fluid is seen within the right mid abdomen anteriorly measuring 2.9 x 5.9 x 8.1 cm in greatest dimension, possibly representing an anastomotic leak given its close association with the anastomotic staple line, postoperative fluid collection, or abscess. There is extensive infiltrative change within the mesentery of the right mid abdomen, possibly postsurgical in nature or inflammatory given the adjacent fluid collection. Mild ascites is present. Stomach is unremarkable. Mild bowel wall thickening involving several loops of distal small bowel within the pelvis, again possibly postsurgical in nature. No evidence of obstruction. No free intraperitoneal gas. Vascular/Lymphatic: Aortic atherosclerosis. No enlarged abdominal or pelvic lymph nodes. Reproductive: Uterus and bilateral adnexa are unremarkable. Other: Mild diffuse subcutaneous body wall edema in keeping with anasarca. Superficial dehiscence of the midline laparotomy wound. The abdominal fascia appears intact. Musculoskeletal: Degenerative changes seen within the lumbar spine. No lytic or blastic bone lesion. IMPRESSION: 1. Surgical changes of right hemicolectomy. Extraluminal collection of gas and fluid within the right mid abdomen anteriorly measuring 2.9 x 5.9 x 8.1 cm in  greatest dimension, possibly representing an anastomotic leak given its close association with the anastomotic staple line, postoperative fluid collection,  or abscess. 2. Extensive infiltrative change within the mesentery of the right mid abdomen, possibly postsurgical in nature or inflammatory given the adjacent fluid collection. 3. Mild bowel wall thickening involving several loops of distal small bowel within the pelvis, again possibly postsurgical in nature. No evidence of obstruction. 4. Slight interval enlargement of right pleural effusion, progressive body wall subcutaneous edema, and development of mild ascites in keeping with progressive anasarca. 5. Focal consolidation within the basilar right middle lobe again noted, not fully assessed on this examination. Aortic Atherosclerosis (ICD10-I70.0). Electronically Signed   By: Fidela Salisbury M.D.   On: 01/24/2022 01:33    Labs:  CBC: Recent Labs    01/22/2022 0530 01/10/2022 1428 01/17/2022 1701 01/26/22 0000 01/26/22 0420 01/20/2022 0939  WBC 9.3 9.1  --  5.9  --  6.9  HGB 7.0* 6.9* 7.1* 7.1* 7.5* 7.8*  HCT 22.8* 23.1* 21.0* 22.5* 22.0* 24.9*  PLT 365 379  --  271  --  321    COAGS: Recent Labs    01/03/22 2119 01/05/22 0000 01/05/2022 0524 01/14/22 0708 01/15/22 0529 01/15/22 2349  INR 3.8*  --   --   --   --   --   APTT 39*   < > 30 26 32 89*   < > = values in this interval not displayed.    BMP: Recent Labs    01/24/22 0432 01/07/2022 0530 01/23/2022 1428 01/10/2022 1701 01/26/22 0000 01/26/22 0420  NA 134* 141 139 142 142 142  K 3.4* 4.6 4.9 4.4 3.1* 4.2  CL 101 109 110  --  118*  --   CO2 '26 27 23  '$ --  20*  --   GLUCOSE 105* 86 75  --  127*  --   BUN 26* 27* 29*  --  24*  --   CALCIUM 8.2* 8.9 9.0  --  7.1*  --   CREATININE 1.00 0.83 1.20*  --  0.86  --   GFRNONAA 58* >60 46*  --  >60  --     LIVER FUNCTION TESTS: Recent Labs    01/10/2022 0538 01/24/22 0432 01/22/2022 1428 01/26/22 0000  BILITOT 1.1 1.2 1.2 0.8  AST 33 '23 22 17  '$ ALT '23 25 27 21  '$ ALKPHOS 86 64 66 49  PROT 5.6* 4.9* 5.8* 4.3*  ALBUMIN 2.6*  2.6* 1.8* 2.1* <1.5*   Drain Location: RLQ Size: Fr  size: 10 Fr Date of placement: 01/26/22  Currently to: Drain collection device: suction bulb 24 hour output:  Output by Drain (mL) 01/26/2022 0701 - 02/01/2022 1900 01/29/2022 1901 - 01/26/22 0700 01/26/22 0701 - 01/26/22 1900 01/26/22 1901 - 01/15/2022 0700 01/23/2022 0701 - 01/20/2022 1123  Closed System Drain 1 RLQ Bulb (JP) 10 Fr.   35 30     Interval imaging/drain manipulation:  None  Current examination: Insertion site unremarkable. Suture and stat lock in place. Dressed appropriately.  OP dark brown fluid IR will continue to follow - please call with questions or concerns.  Assessment and Plan:  Back to OR per CCS note today  Electronically Signed: Lavonia Drafts, PA-C 01/23/2022, 11:23 AM   I spent a total of 15 Minutes at the the patient's bedside AND on the patient's hospital floor or unit, greater than 50% of which was counseling/coordinating care for RLQ abscess drain

## 2022-01-27 NOTE — Progress Notes (Signed)
PT Cancellation Note  Patient Details Name: [name] MRN: [id number] DOB: 1941/04/22   Cancelled Treatment:    Reason Eval/Treat Not Completed: Patient at procedure or test/unavailable;Patient not medically ready;Medical issues which prohibited therapy Pt remains intubated and is to go back to OR today for further abdominal surgery. Will hold PT. Abran Richard, PT Acute Rehab [facility] [phone]   Karlton Lemon 01/28/2022, 12:31 PM

## 2022-01-27 NOTE — Progress Notes (Signed)
[place] Progress Note Patient Name: [name] DOB: February 18, 1941 MRN: [id number]   Date of Service  01/29/2022  HPI/Events of Note  Patient on TPN - Nursing request for Q 4 hour POC CBGs.  eICU Interventions  Will order Q 4 hour POC CBG.     Intervention Category Major Interventions: Other:  [name] 01/26/2022, 10:00 PM

## 2022-01-27 NOTE — Progress Notes (Signed)
SLP Cancellation Note  Patient Details Name: MACKAYLA MULLINS MRN: [id number] DOB: 18-Jul-1940   Cancelled treatment:       Reason Eval/Treat Not Completed: Medical issues which prohibited therapy. Remains on vent at this time. Will f/u as able.    Osie Bond., M.A. [employer] Office [phone]  Secure chat preferred  01/12/2022, 8:02 AM

## 2022-01-28 ENCOUNTER — Encounter (HOSPITAL_COMMUNITY): Payer: Self-pay | Admitting: General Surgery

## 2022-01-28 ENCOUNTER — Other Ambulatory Visit: Payer: Self-pay

## 2022-01-28 DIAGNOSIS — J189 Pneumonia, unspecified organism: Secondary | ICD-10-CM | POA: Diagnosis not present

## 2022-01-28 DIAGNOSIS — E872 Acidosis, unspecified: Secondary | ICD-10-CM

## 2022-01-28 DIAGNOSIS — Z515 Encounter for palliative care: Secondary | ICD-10-CM | POA: Diagnosis not present

## 2022-01-28 DIAGNOSIS — J9601 Acute respiratory failure with hypoxia: Secondary | ICD-10-CM | POA: Diagnosis not present

## 2022-01-28 DIAGNOSIS — I469 Cardiac arrest, cause unspecified: Secondary | ICD-10-CM | POA: Diagnosis not present

## 2022-01-28 DIAGNOSIS — I4811 Longstanding persistent atrial fibrillation: Secondary | ICD-10-CM | POA: Diagnosis not present

## 2022-01-28 LAB — TRIGLYCERIDES: Triglycerides: 129 mg/dL (ref <150)

## 2022-01-28 LAB — COMPREHENSIVE METABOLIC PANEL
ALT: 23 U/L (ref 0–44)
AST: 22 U/L (ref 15–41)
Albumin: <1.5 g/dL — ABNORMAL LOW (ref 3.5–5.0)
Alkaline Phosphatase: 50 U/L (ref 38–126)
Anion gap: 5 (ref 5–15)
BUN: 19 mg/dL (ref 8–23)
CO2: 19 mmol/L — ABNORMAL LOW (ref 22–32)
Calcium: 8.9 mg/dL (ref 8.9–10.3)
Chloride: 117 mmol/L — ABNORMAL HIGH (ref 98–111)
Creatinine, Ser: 0.86 mg/dL (ref 0.44–1.00)
GFR, Estimated: >60 mL/min (ref >60)
Glucose, Bld: 213 mg/dL — ABNORMAL HIGH (ref 70–99)
Potassium: 4.9 mmol/L (ref 3.5–5.1)
Sodium: 141 mmol/L (ref 135–145)
Total Bilirubin: 0.8 mg/dL (ref 0.3–1.2)
Total Protein: 4.5 g/dL — ABNORMAL LOW (ref 6.5–8.1)

## 2022-01-28 LAB — GLUCOSE, CAPILLARY
Glucose-Capillary: 207 mg/dL — ABNORMAL HIGH (ref 70–99)
Glucose-Capillary: 137 mg/dL — ABNORMAL HIGH (ref 70–99)
Glucose-Capillary: 101 mg/dL — ABNORMAL HIGH (ref 70–99)
Glucose-Capillary: 96 mg/dL (ref 70–99)
Glucose-Capillary: 97 mg/dL (ref 70–99)
Glucose-Capillary: 121 mg/dL — ABNORMAL HIGH (ref 70–99)

## 2022-01-28 LAB — CBC
HCT: 39.5 % (ref 36.0–46.0)
Hemoglobin: 13.2 g/dL (ref 12.0–15.0)
MCH: 31.6 pg (ref 26.0–34.0)
MCHC: 33.4 g/dL (ref 30.0–36.0)
MCV: 94.5 fL (ref 80.0–100.0)
Platelets: 356 10*3/uL (ref 150–400)
RBC: 4.18 MIL/uL (ref 3.87–5.11)
RDW: 20.9 % — ABNORMAL HIGH (ref 11.5–15.5)
WBC: 17.3 10*3/uL — ABNORMAL HIGH (ref 4.0–10.5)
nRBC: 0 % (ref 0.0–0.2)

## 2022-01-28 LAB — TYPE AND SCREEN
ABO/RH(D): O POS
Antibody Screen: NEGATIVE
Unit division: 0
Unit division: 0

## 2022-01-28 LAB — BPAM RBC
Blood Product Expiration Date: 202309282359
Blood Product Expiration Date: 202309282359
ISSUE DATE / TIME: 202308231319
ISSUE DATE / TIME: 202308231319
Unit Type and Rh: 5100
Unit Type and Rh: 5100

## 2022-01-28 LAB — MAGNESIUM: Magnesium: 2 mg/dL (ref 1.7–2.4)

## 2022-01-28 LAB — PHOSPHORUS: Phosphorus: 2.9 mg/dL (ref 2.5–4.6)

## 2022-01-28 LAB — VANCOMYCIN, PEAK: Vancomycin Pk: 34 ug/mL (ref 30–40)

## 2022-01-28 MED ORDER — INSULIN ASPART 100 UNIT/ML IJ SOLN
0.0000 [IU] | INTRAMUSCULAR | Status: DC
  Administered 2022-01-28: 2 [IU] via SUBCUTANEOUS

## 2022-01-28 MED ORDER — ALBUMIN HUMAN 5 % IV SOLN
25.0000 g | Freq: Once | INTRAVENOUS | Status: AC
  Administered 2022-01-28: 25 g via INTRAVENOUS
  Filled 2022-01-28: qty 500

## 2022-01-28 MED ORDER — TRACE MINERALS CU-MN-SE-ZN 300-55-60-3000 MCG/ML IV SOLN
INTRAVENOUS | Status: AC
  Filled 2022-01-28: qty 696.8

## 2022-01-28 MED ORDER — INSULIN ASPART 100 UNIT/ML IJ SOLN
0.0000 [IU] | INTRAMUSCULAR | Status: DC
  Administered 2022-01-28 (×2): 3 [IU] via SUBCUTANEOUS

## 2022-01-28 MED ORDER — FENTANYL 2500MCG IN NS 250ML (10MCG/ML) PREMIX INFUSION
0.0000 ug/h | INTRAVENOUS | Status: DC
  Administered 2022-01-28 – 2022-01-29 (×2): 25 ug/h via INTRAVENOUS
  Administered 2022-01-30: 50 ug/h via INTRAVENOUS
  Filled 2022-01-28 (×2): qty 250

## 2022-01-28 MED ORDER — DEXMEDETOMIDINE HCL IN NACL 400 MCG/100ML IV SOLN
0.0000 ug/kg/h | INTRAVENOUS | Status: DC
  Administered 2022-01-28: 0.9 ug/kg/h via INTRAVENOUS
  Administered 2022-01-28: 0.5 ug/kg/h via INTRAVENOUS
  Administered 2022-01-29 (×2): 1.2 ug/kg/h via INTRAVENOUS
  Administered 2022-01-29: 0.5 ug/kg/h via INTRAVENOUS
  Administered 2022-01-29: 1 ug/kg/h via INTRAVENOUS
  Filled 2022-01-28 (×5): qty 100

## 2022-01-28 NOTE — Progress Notes (Signed)
1 unit of insulin administered.

## 2022-01-28 NOTE — Progress Notes (Signed)
[place] Progress Note Patient Name: [name] DOB: Nov 02, 1940 MRN: [id number]   Date of Service  01/28/2022  HPI/Events of Note  Hyperglycemia - Blood glucose = 207.  eICU Interventions  Plan: Q 4 hour sensitive Novolog SSI.      Intervention Category Major Interventions: Hyperglycemia - active titration of insulin therapy  Codie Krogh Cornelia Copa 01/28/2022, 3:29 AM

## 2022-01-28 NOTE — Consult Note (Addendum)
[place] Nurse ostomy consult note: POD 1 Stoma type/location: RLQ ileostomy (Dr. Redmond Pulling) Stomal assessment/size: 1 and 1/4 inch round, red, moist, lumen in center Peristomal assessment: intact Treatment options for stomal/peristomal skin: convex pouch Output : bowel sweat and scant serosanguinous drainage Ostomy pouching: 1pc.compressible convex Education provided: None. Patient intubated in ICU with no family present. Teaching will begin as patient condition improves or when family available Enrolled patient in [place] program:No   Supplies to be ordered by [employer].  5 skin barrier rings, Lawson # [id number] pouches Kellie Simmering # [id number]  [facility] nursing team will follow, and will remain available to this patient, the nursing and medical teams.    Thank you for inviting [place] to participate in this patient's Plan of Care.  Maudie Flakes, MSN, RN, CNS, [place], Serita Grammes, [employer], [employer] phone:  [phone]

## 2022-01-28 NOTE — Progress Notes (Signed)
PHARMACY - TOTAL PARENTERAL NUTRITION CONSULT NOTE  Indication: Intolerance to enteral feeding / anastomotic leak  Patient Measurements: Height: 5' (152.4 cm) Weight: 76 kg (167 lb 8.8 oz) IBW/kg (Calculated) : 45.5 TPN AdjBW (KG): 51 Body mass index is 32.72 kg/m.  Assessment:  36 YOF admited for CAP causing respiratory failure, had a cardiac arrest after admission and was moved to the ICU.  Chest tube placed for parapneumonic effusion, extubated on 8/2. Sent out of ICU on 8/5. On 8/6 she developed ischemic colitis, initially treated conservatively but transferred back to ICU for worsening shock and confusion 8/7.  Now s/p ex lap, R colectomy for ischemic bowel with perforation. Pt was provided TPN from 8/9-8/15. Pt's diet was advanced and pt ate 25-50% of meals from 8/15-8/20. Diet was changed to CLD on 8/20 for IR placement of drain for anastomotic leak. However, drain was not placed because pt acutely decompensated and was intubated and admitted to ICU and made NPO on 8/21. IR placed drain on 8/22 and drainage was positive for enteric contents confirming anastomotic leak. Surgery plans to perform resection of anastomosis and possible diversion with ileostomy on 8/23. Pharmacy consulted to manage TPN.  Glucose / Insulin: A1c 4.9% - patient required 3 units of SSI overnight with TPN initiation  Electrolytes: K: 4.9 post 74mq Kcl, Cl: 117 (high), CO2: 19 (low), corrected calcium: 10.9 (high), phos: 2.9,  calcium phos product: 32, goal < 55 others within normal limit  Renal: SCr < 1, BUN WNL  Hepatic: LFTs / tbili / TG WNL, albumin <1.5 Intake / Output; MIVF: UOP 0.4 ml/kg/hr, drain 062m LBM 8/22, MIVF: LR 2052mr GI Imaging: 8/6 KUB: Mild gaseous distension favors ileus  8/6 CT: concerning for iscehmic colitis; possible SBO and/or inflammatory ileus  8/7 KUB: diffuse gaseous small bowel and colonic distension 8/20 CT: extraluminal collection of gas and fluid possibly representing an  anastomotic leak, postop fluid collection, or abscess GI Surgeries / Procedures:  8/7 Ex lap, colon resection 8/9 Ex lap, intestinal anastomosis, wound closure 8/22 CT-guided anterior RLQ abscess drain placement 8/23 Ex lap with resection of distal small bowel and ileocolonic anastomosis, creation of end ileostomy, and lysis of adhesions   Central access: PICC placed 01/12/22 TPN start date:  02/03/2022-01/19/22, restarted 8/23  Nutritional Goals: RD Estimated Needs Total Energy Estimated Needs: 1700-1900 kcals Total Protein Estimated Needs: 95-115 g Total Fluid Estimated Needs: >/= 1.5 L  Current Nutrition:  NPO and TPN   Plan:  TPN at goal rate to 65 ml/hr to provide 104g AA, 234g CHO, 60.8g lipids, and 1821 kCal Electrolytes in TPN: Na 135m58m, Decrease K 35mE47m Ca 3mEq/64mMg 10mEq/57mhos 30mmol/46mnd Cl:acetaYO:VZCHYIF:2. Add standard MVI and trace elements to TPN  Increase SSI to moderate Q4H.  Daily BMP, Mag and Phos until stable on TPN  Taquan Bralley Esmeralda Arthur  Clinical Pharmacist 01/28/2022 7:06 AM   Please refer to [facility] for pharmacy phone number

## 2022-01-28 NOTE — Progress Notes (Signed)
[place] Progress Note Patient Name: [name] DOB: 17-Sep-1940 MRN: [id number]   Date of Service  01/28/2022  HPI/Events of Note  Review of CXR reveals ETT tip in satisfactory position  3.3 cm above the carina.  eICU Interventions  Continue present management.      Intervention Category Major Interventions: Respiratory failure - evaluation and management  Brian Kocourek Cornelia Copa 01/28/2022, 12:59 AM

## 2022-01-28 NOTE — Progress Notes (Signed)
SLP Cancellation Note  Patient Details Name: [name] MRN: [id number] DOB: [birth year]   Cancelled treatment:       Reason Eval/Treat Not Completed: Medical issues which prohibited therapy (remains on vent at this time). Will f/u as able.     Osie Bond., M.A. [place] Office [phone]  Secure chat preferred  01/28/2022, 7:35 AM

## 2022-01-28 NOTE — Progress Notes (Signed)
NAME:  ZELINA JIMERSON, MRN:  [id number], DOB:  May 04, 1941, LOS: 30 ADMISSION DATE:  12/26/2021, CONSULTATION DATE:  01/24/2022 REFERRING MD:  Bonner Puna - [facility] CHIEF COMPLAINT:  Hypotension, abdominal pain   History of Present Illness:  81 year old woman who presented to [facility] 7/28 with CAP causing respiratory failure, had a cardiac arrest after admission and was moved to the ICU.  Chest tube placed for parapneumonic effusion, extubated on 8/2. Sent out of ICU on 8/5. 8/6 developed ischemic colitis initially treated conservatively but called back 8/7 for worsening shock, confusion. Exploratory laparotomy performed by general surgery on 8/7 for ischemic colitis with perforation s/p right colectomy and repeat laparotomy on 8/9 with intestinal anastomosis and abdominal closure. She was extubated on 8/11 and transferred to the general medical floor. She had CT abdomen 8/20 which should right sided abdominal fluid collection in which IR was consulted for drainage. Rapid response was called in the afternoon 8/21 for respiratory distress in which she was reintubated and transferred to the ICU.   Pertinent Medical History:  Breast cancer on letrozole Atrial fibrillation  Significant Hospital Events: Including procedures, antibiotic start and stop dates in addition to other pertinent events   7/28 admit, Ceftriaxone/Azithro 7/29 last dose xeralto  7/30 IHCA x 7 min, Ceftriaxone>Cefepime 7/31 Chest tube placed, heparin gtt started 8/1 patient remove chest tube.  Head CT obtained >negative.  Severely agitated overnight.  Vancomycin stopped.  Azithromycin stopped. 8/6 Transferred to [facility] 8/7 Called CCM back for abd, Distention , hypotension and a fib with RVR, plan is for  urgent OR pending goals of care discussion then ICU post op if husband is in agreement. 8/10 Abdomen closed yesterday, SBT/SAT today 8/11 Extubated yesterday, doing well on [place], up OOB, feeling alright 8/13 Transferred to medical  floor 8/14 Chest ultrasound shows atelectasis without significant right basilar effusion. 8/20 CT A/P with fuid collection in R mid-abdomen with c/f anastomotic leak. 8/21 Back to ICU for resp distress 8/22 CT-guided drain placement (RLQ) for fluid assessment, abscess vs. Anastomotic leak 8//23 Taken back to OR for ex-lap/concern for anastomotic leak; extensive LOA with findings of feculent peritonitis with +leak; enterotomies requiring additional SBR and end ileostomy. 8/24 POD#1 from [place]. Unable to wean vent this AM. Following simple commands on Precedex 0.5, Levo 8. No output from new ileostomy.  Interim History / Subjective:  No significant events overnight POD#1 from OR takeback for ?anastomotic leak +Leak noted, required extensive LOA with unavoidable enterotomies, additional SBR Skin left open, WTD dressings in place, possible vac New ileostomy without output Continued TPN Unable to wean vent today, will optimize sedation as able Albumin 5% for volume/pressor weaning  Objective:  Blood pressure (!) 116/54, pulse 88, temperature 98.1 F (36.7 C), temperature source Bladder, resp. rate 20, height 5' (1.524 m), weight 76 kg, SpO2 92 %.    Vent Mode: PRVC FiO2 (%):  [40 %] 40 % Set Rate:  [20 bmp] 20 bmp Vt Set:  [360 mL] 360 mL PEEP:  [5 cmH20] 5 cmH20 Pressure Support:  [8 cmH20] 8 cmH20 Plateau Pressure:  [15 cmH20-17 cmH20] 15 cmH20   Intake/Output Summary (Last 24 hours) at 01/28/2022 0820 Last data filed at 01/28/2022 0700 Gross per 24 hour  Intake 3686.64 ml  Output 925 ml  Net 2761.64 ml    Filed Weights   01/26/22 0500 02/02/2022 0500 01/28/22 0430  Weight: 72.5 kg 74.1 kg 76 kg   Physical Examination: General: Acute-on-chronically ill-appearing elderly in NAD.  Appears uncomfortable. HEENT: [place]/AT, anicteric sclera, PERRL 53m, dry mucous membranes. ETT in place. Neuro: Lethargic. Responsive to persistent Withdraws to pain in all 4 extremities. Following  commands intermittently. Moves all 4 extremities spontaneously. +Corneal, +Cough, and +Gag  CV: RRR 90s, no m/g/r. PULM: Breathing even and unlabored on vent (PEEP 5, FiO2 40%). Lung fields coarse bilaterally. GI: Soft, mildly distended, midline surgical incision with open skin/WTD dressing with Kerlix in place. Tissue pink/scant serosanguineous drainage. RUQ ileostomy with pink budding stoma, scant serosanguineous output in ostomy bag, no stool output noted. Extremities: Bilateral symmetric 1+ LE edema noted, BUE extremities with 1+ symmetric swelling. DP pulses present but faint. Skin: Cool/dry, BUE/BLE with mottling and slow cap refill.  Resolved Hospital Problem List:     Assessment & Plan:  Septic Shock In setting of intrabdominal fluid collection and possible aspiration pneumonia with new dense RLL opacity. Bedside [place]8/22 not concerning for effusion. - Goal MAP > 65 - Fluid resuscitation as tolerated, will administer albumin 5% 5059m- Levophed titrated to goal MAP - Trend WBC, fever curve - F/u Cx data - Continue broad-spectrum antibiotics (Vanc, Zosyn)  Acute Hypoxemic Respiratory Failure PAH group 2  CAP with parapneumonic effusion, s/p right pigtail - removed 8/1 Aspiration Pneumonia - Continue full vent support (4-8cc/kg IBW) - Wean FiO2 for O2 sat > 90% - Daily WUA/SBT - VAP bundle - Pulmonary hygiene - PAD protocol for sedation: Precedex and Fentanyl for goal RASS 0 to -1  Ischemic bowel with perf, s/p ex lap, intestinal anastomosis Intraabdominal fluid collection  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 8/22 CT-guided drain placement (RLQ) for fluid assessment, abscess vs. anastomotic leak. Taken back to OR 8/23 for ex-lap/concern for anastomotic leak; extensive LOA with findings of feculent peritonitis with +leak; enterotomies requiring additional SBR and end ileostomy. - Postoperative management per CCS -  Abdominal midline incision open, continue WTD dressings BID - Possible WV - Antibiotic coverage as above - S/p new ileostomy, WOC consulted   Metabolic Acidosis Hypokalemia Hypomagnesemia - Trend BMP - Replete electrolytes as indicated - Monitor I&Os  Afib - Hold diuretics/BB in the setting of shock - Hold systemic AC in the setting of multiple procedural interventions - Cardiac monitoring  Acute anemia, symptomatic  Multifactorial in the setting of surgery, critical illness, iatrogenic losses. - Trend H&H - Monitor for signs of active bleeding - Transfuse for Hgb < 7.0 or hemodynamically significant bleeding  Severe protein calorie malnutrition  - Diet advancement per CCS, no enteral feeds until ileostomy output/decreased pressor needs - Continue TPN  Best Practice (right click and "Reselect all SmartList Selections" daily)   Diet/type: NPO DVT prophylaxis: SCD GI prophylaxis: PPI Lines: Central line Foley:  Yes, and it is still needed Code Status:  full code Last date of multidisciplinary goals of care discussion [n/a]  Critical care time: 43 minutes   The patient is critically ill with multiple organ system failure and requires high complexity decision making for assessment and support, frequent evaluation and titration of therapies, advanced monitoring, review of radiographic studies and interpretation of complex data.   Critical Care Time devoted to patient care services, exclusive of separately billable procedures, described in this note is 43 minutes.  StLestine MountPA-C [facility] 01/28/22 9:06 AM  Please see [email or web address] for pager details.  From 7A-7P if no response, please call [phone] After hours, please call ELink [phone]

## 2022-01-28 NOTE — Progress Notes (Signed)
OT Cancellation Note  Patient Details Name: [name] MRN: [id number] DOB: 1941/02/24   Cancelled Treatment:    Reason Eval/Treat Not Completed: Medical issues which prohibited therapy.  Patient intubated and sedated, will continue to follow.    Harless Molinari D Ashleymarie Granderson 01/28/2022, 11:18 AM 01/28/2022  RP, OTR/L  Acute Rehabilitation Services  Office:  [phone]

## 2022-01-28 NOTE — Progress Notes (Signed)
Inpatient Rehabilitation Admissions Coordinator   Patient intubated. We will sign off at this time.  Danne Baxter, RN, MSN Rehab Admissions Coordinator [phone] 01/28/2022 8:25 AM

## 2022-01-28 NOTE — Anesthesia Postprocedure Evaluation (Signed)
Anesthesia Post Note  Patient: [name]  Procedure(s) Performed: EXPLORATORY LAPAROTOMY WITH PARTIAL COLECTOMY , iLEOSTOMY (Abdomen) LYSIS OF ADHESIONS (Abdomen)     Patient location during evaluation: SICU Anesthesia Type: General Level of consciousness: sedated Pain management: pain level controlled Vital Signs Assessment: post-procedure vital signs reviewed and stable Respiratory status: patient remains intubated per anesthesia plan Cardiovascular status: stable Postop Assessment: no apparent nausea or vomiting Anesthetic complications: no   No notable events documented.            Effie Berkshire

## 2022-01-28 NOTE — Progress Notes (Signed)
[facility] Progress Note  1 Day Post-Op  Subjective: CC-  On the vent. Stable pressor requirements, Levo 8. No ileostomy output.  Objective: Vital signs in last 24 hours: Temp:  [97 F (36.1 C)-98.4 F (36.9 C)] 98.1 F (36.7 C) (08/24 0400) Pulse Rate:  [59-111] 92 (08/24 0745) Resp:  [17-28] 22 (08/24 0745) BP: (97-128)/(48-64) 116/54 (08/23 1230) SpO2:  [76 %-100 %] 92 % (08/24 0745) Arterial Line BP: (85-133)/(40-73) 98/57 (08/24 0715) FiO2 (%):  [40 %] 40 % (08/24 0745) Weight:  [76 kg] 76 kg (08/24 0430) Last BM Date : 01/26/22  Intake/Output from previous day: 08/23 0701 - 08/24 0700 In: 3835.3 [I.V.:2890.1; Blood:630; IV Piggyback:315.2] Out: 1010 [Urine:710; Blood:300] Intake/Output this shift: No intake/output data recorded.  PE: Gen:  sedated on the vent Card:  A fib, HR 100s Pulm: mechanically ventilated Abd: Soft, minimal distension, nontender, open midline with healthy granulation tissue and no purulent drainage or cellulitis, ostomy viable with scant sweat in the bag  Vent: Vent Mode: PRVC FiO2 (%):  [40 %] 40 % Set Rate:  [20 bmp] 20 bmp Vt Set:  [360 mL] 360 mL PEEP:  [5 cmH20] 5 cmH20 Pressure Support:  [8 cmH20] 8 cmH20 Plateau Pressure:  [15 cmH20-17 cmH20] 15 cmH20  Lab Results:  Recent Labs    01/17/2022 0939 01/23/2022 1333 01/14/2022 1518 01/28/22 0313  WBC 6.9  --   --  17.3*  HGB 7.8*   < > 11.2* 13.2  HCT 24.9*   < > 33.0* 39.5  PLT 321  --   --  356   < > = values in this interval not displayed.   BMET Recent Labs    01/31/2022 1734 01/28/22 0313  NA 142 141  K 3.3* 4.9  CL 118* 117*  CO2 21* 19*  GLUCOSE 109* 213*  BUN 17 19  CREATININE 0.75 0.86  CALCIUM 9.0 8.9   PT/INR No results for input(s): "LABPROT", "INR" in the last 72 hours. CMP     Component Value Date/Time   NA 141 01/28/2022 0313   NA 139 11/18/2021 1432   NA 134 (L) 04/25/2017 1216   K 4.9 01/28/2022 0313   K 4.3 04/25/2017 1216   CL  117 (H) 01/28/2022 0313   CO2 19 (L) 01/28/2022 0313   CO2 29 04/25/2017 1216   GLUCOSE 213 (H) 01/28/2022 0313   GLUCOSE 85 04/25/2017 1216   BUN 19 01/28/2022 0313   BUN 10 11/18/2021 1432   BUN 14.4 04/25/2017 1216   CREATININE 0.86 01/28/2022 0313   CREATININE 0.72 01/17/2018 1347   CREATININE 0.7 04/25/2017 1216   CALCIUM 8.9 01/28/2022 0313   CALCIUM 10.0 04/25/2017 1216   PROT 4.5 (L) 01/28/2022 0313   PROT 7.2 04/25/2017 1216   ALBUMIN <1.5 (L) 01/28/2022 0313   ALBUMIN 3.7 04/25/2017 1216   AST 22 01/28/2022 0313   AST 20 01/17/2018 1347   AST 21 04/25/2017 1216   ALT 23 01/28/2022 0313   ALT 10 01/17/2018 1347   ALT 10 04/25/2017 1216   ALKPHOS 50 01/28/2022 0313   ALKPHOS 75 04/25/2017 1216   BILITOT 0.8 01/28/2022 0313   BILITOT 0.5 01/17/2018 1347   BILITOT 0.65 04/25/2017 1216   GFRNONAA >60 01/28/2022 0313   GFRNONAA >60 01/17/2018 1347   GFRAA 90 04/24/2020 1235   GFRAA >60 01/17/2018 1347   Lipase     Component Value Date/Time   LIPASE 77 07/28/2008 1630  Studies/Results: DG CHEST PORT 1 VIEW  Result Date: 01/28/2022 CLINICAL DATA:  Endotracheal tube evaluation. EXAM: PORTABLE CHEST 1 VIEW COMPARISON:  Radiograph yesterday. FINDINGS: Endotracheal tube tip is 3.3 cm from the carina. Enteric tube in place, side-port below the diaphragm. There is a new right internal jugular central venous catheter tip overlying the mid SVC. Stable left upper extremity PICC. Improving right pleural effusion and basilar opacity. Stable retrocardiac opacity and small effusion. No pneumothorax. Stable heart size. IMPRESSION: 1. New right internal jugular central venous catheter with tip overlying the mid SVC. Endotracheal and enteric tubes in place. 2. Improving right pleural effusion and basilar opacity. Stable retrocardiac opacity and small effusion. Electronically Signed   By: Keith Rake M.D.   On: 01/28/2022 00:03   CT GUIDED VISCERAL FLUID DRAIN BY PERC  CATH  Result Date: 01/26/2022 INDICATION: Right lower quadrant abscess by CT EXAM: CT DRAINAGE ANTERIOR RIGHT LOWER QUADRANT ABSCESS Date:  01/26/2022 01/26/2022 3:25 pm Radiologist:  Jerilynn Mages. Daryll Brod, MD Guidance:  CT FLUOROSCOPY: None. MEDICATIONS: 1% lidocaine local ANESTHESIA/SEDATION: Moderate (conscious) sedation was employed during this procedure. A total of Versed 1.0 mg and Fentanyl 25 mcg was administered intravenously. Moderate Sedation Time: 17 minutes. The patient's level of consciousness and vital signs were monitored continuously by radiology nursing throughout the procedure under my direct supervision. CONTRAST:  None. COMPLICATIONS: None immediate. PROCEDURE: Informed consent was obtained from the patient following explanation of the procedure, risks, benefits and alternatives. The patient understands, agrees and consents for the procedure. All questions were addressed. A time out was performed. Maximal barrier sterile technique utilized including caps, mask, sterile gowns, sterile gloves, large sterile drape, hand hygiene, and Betadine. Previous imaging reviewed. Patient positioned supine. Noncontrast localization CT performed. The anterior right lower quadrant air-fluid collection below the bowel anastomosis was localized and compared to the prior study. Air-fluid collection now contains oral contrast when compared to 01/24/2022 compatible with anastomotic leak. Under sterile conditions and local anesthesia, an 18 gauge 10 cm access needle was advanced from an anterolateral approach into the air-fluid collection. Needle position confirmed with CT. Syringe aspiration yielded fecal contaminated fluid. Guidewire inserted followed by tract dilatation to insert a 10 [place] drain. Drain catheter position confirmed with CT. Abscess decompressed by syringe aspiration. 30 cc fecal contaminated fluid aspirated. G stain and culture sent. Catheter secured with a Prolene suture and connected to external suction  bulb. Sterile dressing applied. No immediate complication. Patient tolerated the procedure well. IMPRESSION: Successful CT-guided anterior right lower quadrant abscess drain placement. Electronically Signed   By: Jerilynn Mages.  Shick M.D.   On: 01/26/2022 15:46    Anti-infectives: Anti-infectives (From admission, onward)    Start     Dose/Rate Route Frequency Ordered Stop   01/26/22 1400  piperacillin-tazobactam (ZOSYN) IVPB 3.375 g        3.375 g 12.5 mL/hr over 240 Minutes Intravenous Every 8 hours 01/26/22 0945     01/17/2022 1030  vancomycin (VANCOREADY) IVPB 750 mg/150 mL        750 mg 150 mL/hr over 60 Minutes Intravenous Every 24 hours 01/30/2022 0937     01/08/2022 1000  vancomycin (VANCOREADY) IVPB 1500 mg/300 mL  Status:  Discontinued        1,500 mg 150 mL/hr over 120 Minutes Intravenous Every 48 hours 01/23/22 0909 01/31/2022 0937   01/23/22 1030  vancomycin (VANCOREADY) IVPB 1250 mg/250 mL        1,250 mg 166.7 mL/hr over 90 Minutes Intravenous  Once 01/23/22  3825 01/23/22 1259   01/23/22 1000  metroNIDAZOLE (FLAGYL) IVPB 500 mg  Status:  Discontinued        500 mg 100 mL/hr over 60 Minutes Intravenous Every 12 hours 01/23/22 0839 01/26/22 0945   01/23/22 1000  ceFEPIme (MAXIPIME) 2 g in sodium chloride 0.9 % 100 mL IVPB  Status:  Discontinued        2 g 200 mL/hr over 30 Minutes Intravenous Every 12 hours 01/23/22 0901 01/26/22 0945   01/29/2022 1400  micafungin (MYCAMINE) 100 mg in sodium chloride 0.9 % 100 mL IVPB  Status:  Discontinued        100 mg 105 mL/hr over 1 Hours Intravenous Every 24 hours 01/24/2022 1302 01/15/22 1211   01/10/22 1515  piperacillin-tazobactam (ZOSYN) IVPB 3.375 g        3.375 g 12.5 mL/hr over 240 Minutes Intravenous Every 8 hours 01/10/22 1427 01/26/2022 2359   01/04/22 2000  vancomycin (VANCOCIN) IVPB 1000 mg/200 mL premix  Status:  Discontinued        1,000 mg 200 mL/hr over 60 Minutes Intravenous Every 24 hours 01/03/22 1910 01/05/22 1321   01/03/22 2200   ceFEPIme (MAXIPIME) 2 g in sodium chloride 0.9 % 100 mL IVPB        2 g 200 mL/hr over 30 Minutes Intravenous Every 12 hours 01/03/22 1910 01/08/22 2209   01/03/22 1930  vancomycin (VANCOREADY) IVPB 1250 mg/250 mL        1,250 mg 166.7 mL/hr over 90 Minutes Intravenous  Once 01/03/22 1910 01/03/22 2219   01/02/22 2100  azithromycin (ZITHROMAX) 500 mg in sodium chloride 0.9 % 250 mL IVPB        500 mg 250 mL/hr  Intravenous Every 24 hours 12/07/2021 2248 01/06/22 2149   01/02/22 2000  cefTRIAXone (ROCEPHIN) 2 g in sodium chloride 0.9 % 100 mL IVPB  Status:  Discontinued        2 g 200 mL/hr over 30 Minutes Intravenous Every 24 hours 12/29/2021 2248 01/03/22 1733   12/15/2021 2030  cefTRIAXone (ROCEPHIN) 1 g in sodium chloride 0.9 % 100 mL IVPB        1 g 200 mL/hr over 30 Minutes Intravenous  Once 12/31/2021 2025 12/21/2021 2141   12/07/2021 2030  azithromycin (ZITHROMAX) 500 mg in sodium chloride 0.9 % 250 mL IVPB        500 mg 250 mL/hr over 60 Minutes Intravenous  Once 12/06/2021 2025 12/29/2021 2312        Assessment/Plan POD #17/15, S/p exploratory laparotomy,  R colectomy, abthera placement 01/28/2022 Dr. Rosendo Gros S/p exploratory laparotomy, intestinal anastomosis, abdominal closure 01/10/2022 Dr. Rosendo Gros  for ischemic bowel with perforation POD#1 S/p exploratory laparotomy, extensive LOA, resection distal small bowel and ileocolonic anastomosis, creation end ileostomy 8/23 Dr. Redmond Pulling - Continue TPN. Await ostomy output and weaning pressors prior to starting tube feedings - WOC consult for new ileostomy - BID wet to dry dressing changes to midline wound. Consider placing vac tomorrow   FEN: NPO /OGT/ TNA  ID: Zosyn (stop date 8/14), cefepime/flagyl 8/19 >>8/21, Vanc/zosyn 8/21 --> VTE: SCD's, hold DOAC/ ok for heparin gtt if needed no bolus Foley: in place for strict I&Os Dispo: ICU   Below per TRH/CCM -- Septic shock due to above Acute hypoxic respiratory failure Cardiac arrest 7/30, 7 mins  CPR Hx cardiomyopathy COPD Afib, permanent CAP    LOS: 27 days    Wellington Hampshire, [facility] 01/28/2022, 8:47 AM Please see  Amion for pager number during day hours 7:00am-4:30pm

## 2022-01-28 NOTE — Progress Notes (Signed)
Palliative Medicine Inpatient Follow Up Note   HPI: 81 y.o. female  with past medical history of breast cancer, atrial fibrillation, and anemia who was admitted admitted on 12/10/2021 with community-acquired pneumonia and acute respiratory failure.  On 7/30 she went into cardiac arrest and ROSC was achieved after 7 minutes.  Chest tube was placed for parapneumonic effusion, and she was extubated on 8/2.  Sent out of ICU on 8/5.  She underwent exploratory laparotomy on 8/7 for ischemic colitis with perforation and is s/p right colectomy.  She underwent repeat laparotomy on 8/9 with intestinal anastomosis and abdominal closure.  She was extubated 8/10 and transferred to the floor.  CT abdomen 8/20 showed right-sided abdominal fluid collection in which IR was consulted for drainage.  Rapid response was called 8/21 for respiratory distress in which she was intubated and transferred back to ICU.  Palliative medicine was consulted for goals of care in the setting of prolonged hospital stay, multiple complications, and complex medical history.  Today's Discussion 01/28/2022  *Please note that this is a verbal dictation therefore any spelling or grammatical errors are due to the "[employer] [employer]" system interpretation.  Chart reviewed inclusive of vital signs, progress notes, laboratory results, and diagnostic images.   Patient's RN requested that patient be seen and family spoken to as they were looking for the PMT this morning. Patients spouse came and left the bedside promptly without communication with PMT.  I called patients husband, he was quite concerned about Krystine over the phone and we reviewed her present clinical state. I reinforced the role of Palliative care. We determined that a meeting would be held tomorrow at [place] with Kerrin Champagne (step-son), and over the phone with Astraea's son, Sherren Mocha.   I met at bedside with Jocelyn Lamer this afternoon. She is intubated and sedated. She flutters her eyes  though does not open them. She is able to squeeze my hand.   Patient's step-son, Quita Skye was present at bedside. He shares that he is a travel ICU nurse and has apprised his father of the reason for Palliative involvement. We reviewed patient's re-intubations and the long term concerns associated with her present state. He vocalizes understanding and ability to be part of the meeting tomorrow. He shares that his father remains to have a great deal of hope.   Questions and concerns addressed/Palliative Support Provided.   Objective Assessment: Vital Signs Vitals:   01/28/22 1330 01/28/22 1345  BP:    Pulse: 98 91  Resp: (!) 25 (!) 21  Temp:    SpO2: 99% 96%    Intake/Output Summary (Last 24 hours) at 01/28/2022 1634 Last data filed at 01/28/2022 1500 Gross per 24 hour  Intake 2333.36 ml  Output 395 ml  Net 1938.36 ml   Last Weight  Most recent update: 01/28/2022  4:36 AM    Weight  76 kg (167 lb 8.8 oz)            Gen:  Frail elderly F  HEENT: ETT, bite guard CV: Regular rate and rhythm  PULM: On mechanical ventilator ABD: Abdominal dressing, ostomy EXT: Generalized edema  Neuro: Sedated  SUMMARY OF RECOMMENDATIONS   Full Code/Full scope of care  Plan for family meeting tomorrow at [facility]  Patient is very fragile and at high risk for acute decompensation  Ongoing Palliative team support  Billing based on MDM: High ______________________________________________________________________________________ [place] Team Team Cell Phone: [phone] Please utilize secure chat with additional questions, if  there is no response within 30 minutes please call the above phone number  Palliative Medicine Team providers are available by phone from 7am to 7pm daily and can be reached through the team cell phone.  Should this patient require assistance outside of these hours, please call the patient's attending physician.

## 2022-01-28 NOTE — Progress Notes (Signed)
PT Cancellation Note  Patient Details Name: [name] MRN: [id number] DOB: Sep 26, 1940   Cancelled Treatment:    Reason Eval/Treat Not Completed: Patient not medically ready (pt intubated and sedated. Await MD clearance for decreased sedation for participation)   Sandy Salaam Rashmi Tallent 01/28/2022, 8:01 AM [place] Office: [phone]

## 2022-01-29 DIAGNOSIS — Z515 Encounter for palliative care: Secondary | ICD-10-CM | POA: Diagnosis not present

## 2022-01-29 DIAGNOSIS — J9601 Acute respiratory failure with hypoxia: Secondary | ICD-10-CM | POA: Diagnosis not present

## 2022-01-29 DIAGNOSIS — Z7189 Other specified counseling: Secondary | ICD-10-CM | POA: Diagnosis not present

## 2022-01-29 DIAGNOSIS — Z66 Do not resuscitate: Secondary | ICD-10-CM

## 2022-01-29 LAB — GLUCOSE, CAPILLARY
Glucose-Capillary: 119 mg/dL — ABNORMAL HIGH (ref 70–99)
Glucose-Capillary: 129 mg/dL — ABNORMAL HIGH (ref 70–99)
Glucose-Capillary: 135 mg/dL — ABNORMAL HIGH (ref 70–99)
Glucose-Capillary: 95 mg/dL (ref 70–99)
Glucose-Capillary: 87 mg/dL (ref 70–99)

## 2022-01-29 LAB — BASIC METABOLIC PANEL
Anion gap: 5 (ref 5–15)
BUN: 23 mg/dL (ref 8–23)
CO2: 24 mmol/L (ref 22–32)
Calcium: 8.9 mg/dL (ref 8.9–10.3)
Chloride: 117 mmol/L — ABNORMAL HIGH (ref 98–111)
Creatinine, Ser: 0.68 mg/dL (ref 0.44–1.00)
GFR, Estimated: >60 mL/min (ref >60)
Glucose, Bld: 121 mg/dL — ABNORMAL HIGH (ref 70–99)
Potassium: 4.3 mmol/L (ref 3.5–5.1)
Sodium: 146 mmol/L — ABNORMAL HIGH (ref 135–145)

## 2022-01-29 LAB — MAGNESIUM: Magnesium: 1.9 mg/dL (ref 1.7–2.4)

## 2022-01-29 LAB — CBC
HCT: 30.2 % — ABNORMAL LOW (ref 36.0–46.0)
Hemoglobin: 9.7 g/dL — ABNORMAL LOW (ref 12.0–15.0)
MCH: 31.4 pg (ref 26.0–34.0)
MCHC: 32.1 g/dL (ref 30.0–36.0)
MCV: 97.7 fL (ref 80.0–100.0)
Platelets: 250 10*3/uL (ref 150–400)
RBC: 3.09 MIL/uL — ABNORMAL LOW (ref 3.87–5.11)
RDW: 20.4 % — ABNORMAL HIGH (ref 11.5–15.5)
WBC: 14.5 10*3/uL — ABNORMAL HIGH (ref 4.0–10.5)
nRBC: 0.1 % (ref 0.0–0.2)

## 2022-01-29 LAB — AEROBIC/ANAEROBIC CULTURE W GRAM STAIN (SURGICAL/DEEP WOUND)

## 2022-01-29 LAB — PHOSPHORUS: Phosphorus: 3.2 mg/dL (ref 2.5–4.6)

## 2022-01-29 MED ORDER — ORAL CARE MOUTH RINSE: 15.0000 mL | OROMUCOSAL | Status: DC | PRN

## 2022-01-29 MED ORDER — INSULIN ASPART 100 UNIT/ML IJ SOLN
0.0000 [IU] | Freq: Three times a day (TID) | INTRAMUSCULAR | Status: DC
  Administered 2022-01-29 – 2022-01-31 (×3): 1 [IU] via SUBCUTANEOUS

## 2022-01-29 MED ORDER — METOPROLOL TARTRATE 5 MG/5ML IV SOLN
2.5000 mg | INTRAVENOUS | Status: AC | PRN
  Administered 2022-01-29 – 2022-01-30 (×3): 2.5 mg via INTRAVENOUS
  Filled 2022-01-29 (×2): qty 5

## 2022-01-29 MED ORDER — ALBUMIN HUMAN 5 % IV SOLN
25.0000 g | Freq: Once | INTRAVENOUS | Status: AC
  Administered 2022-01-29: 25 g via INTRAVENOUS
  Filled 2022-01-29: qty 500

## 2022-01-29 MED ORDER — PANTOPRAZOLE SODIUM 40 MG IV SOLR
40.0000 mg | INTRAVENOUS | Status: DC
  Administered 2022-01-29 – 2022-02-10 (×13): 40 mg via INTRAVENOUS
  Filled 2022-01-29 (×13): qty 10

## 2022-01-29 MED ORDER — ORAL CARE MOUTH RINSE
15.0000 mL | OROMUCOSAL | Status: DC
  Administered 2022-01-29 – 2022-01-30 (×18): 15 mL via OROMUCOSAL

## 2022-01-29 MED ORDER — TRACE MINERALS CU-MN-SE-ZN 300-55-60-3000 MCG/ML IV SOLN
INTRAVENOUS | Status: AC
  Filled 2022-01-29: qty 696.8

## 2022-01-29 MED ORDER — FENTANYL BOLUS VIA INFUSION
25.0000 ug | INTRAVENOUS | Status: DC | PRN
  Administered 2022-01-29 (×2): 50 ug via INTRAVENOUS
  Administered 2022-01-29 (×2): 75 ug via INTRAVENOUS
  Administered 2022-01-29 (×3): 25 ug via INTRAVENOUS
  Administered 2022-01-30: 50 ug via INTRAVENOUS

## 2022-01-29 NOTE — Progress Notes (Addendum)
   Palliative Medicine Inpatient Follow Up Note  Family Meeting (01/29/2022):  Participants: Spouse, [name], Son Sherren Mocha, Step-Son - Adam  Providers: [place] PA, Trudee Grip, RN, Krystal Eaton, RN, Tacey Ruiz, [facility]  Discussion:  I met at bedside with patient's family and colleague providers.  Introductions were made and a brief description of palliative care was provided.  I shared that we help with patients who have chronic diseases and serious illnesses to better establish goals of care.  Colletta Maryland was able to provide a summary of Jonique's stays in the intensive care unit.  She was able to explain how Aminata is doing today and that yesterday she was less optimistic though today there have been some mild improvements seen.  She talked a great deal about extubation and what that would look like.  Discussion held in terms of patient's poor physical and nutritional state.  Review of patient's prolonged hospital stay and the possible outcomes related to that was held.  Discussion of the case clinical state was held we are able to have an open and honest conversation regarding cardiopulmonary resuscitation status.  Family was told that if patient were to have a cardiac arrest at her likely outcome would be very poor.  Shared that she would likely not survive such an event and if she did she would not be the same person that she was prior.  We discussed that a modern medicine he can do many things though the question becomes do these many things benefit this individual at the end of the day.  Family seem to understand this and agree that Jaslen had been through enough.  They further determine that if she gets extubated that they would not desire to reintubate her.  This was confirmed with patient's stepson multiple times verbally and all 3 family members agreed that patient should be a DO NOT RESUSCITATE CODE STATUS.  Plan for the time being is watchful waiting to see if  improvements can be made.  If for any reason patient is not able to be weaned off of ventilatory support and additional goals of care conversation will be had to determine the course from there.  Patient's husband and son Sherren Mocha have already stated that she would not want to live a life whereby she was cared for by other people for the duration.  She would not desire to be at an LTAC nor would she desire tracheostomy.  SUMMARY OF RECOMMENDATIONS   DNAR --> if or when extubated no plan for reintubation, patient will be a DO NOT INTUBATE  Patient is very fragile and at high risk for acute decompensation   Ongoing Palliative team support  Total Time: 5 Billing based on MDM: High ______________________________________________________________________________________ [place] Team Team Cell Phone: [phone] Please utilize secure chat with additional questions, if there is no response within 30 minutes please call the above phone number  Palliative Medicine Team providers are available by phone from 7am to 7pm daily and can be reached through the team cell phone.  Should this patient require assistance outside of these hours, please call the patient's attending physician.

## 2022-01-29 NOTE — Consult Note (Signed)
WOC Nurse Consult Note: Attempted vac dressing start. Supplies have arrived. Family present, just completed Craig meeting and need a little time with patient. WOC will check back shortly for better time.  Cathlean Marseilles Tamala Julian, MSN, RN, [place], Lysle Pearl, [place] Wound Treatment Associate Pager [phone]

## 2022-01-29 NOTE — Progress Notes (Signed)
Occupational Therapy Treatment Reassessment Patient Details Name: [name] MRN: [id number] DOB: [birth year] Today's Date: 01/29/2022   History of present illness 81 y.o. female admitted 12/06/2021 with CAP.  7/30 AMS, head CT negative for acute abnormality. Code blue 7/30, ROSC achieved after 7 min CPR, pt intubated. Chest tube insertion 7/31; pt removed CT 8/1. ETT 7/30-8/2. Continued AMS, hallucinations overnight 8/2; head CT 8/2 remains negative for acute abnormality. EEG 8/2 with no seizure activity. Improved cognition 8/5, but persistent tachycardia/afib with RVR. 8/6 ischemic colitis. 8/7 shock with intubation and to OR for ex lap. 8/9 abdomen closure. 8/10 extubation. 8/21 respiratory distress with reintubation. 8/23 return to OR for ex lap and small bowel resection with ileostomy and re-intubated. PMHx:Afib, HTN, breast CA, melanoma, asthma, bilateral TKA, anxiety.   OT comments  Patient seen post surgery with re-intubation.  Plan of care updated and goals adjusted as needed.  Patient needing p to Max A of 2 for basic mobility, and Max A for ADL at bedlevel.  Discharge recommendation updated to LTAC unless she is successfully extubated.  OT will continue efforts in the acute setting.     Recommendations for follow up therapy are one component of a multi-disciplinary discharge planning process, led by the attending physician.  Recommendations may be updated based on patient status, additional functional criteria and insurance authorization.    Follow Up Recommendations  OT at Long-term acute care hospital    Assistance Recommended at Discharge Frequent or constant Supervision/Assistance  Patient can return home with the following  Assistance with cooking/housework;Assistance with feeding;Direct supervision/assist for medications management;Direct supervision/assist for financial management;Assist for transportation;Help with stairs or ramp for entrance;Two people to help with walking  and/or transfers;A lot of help with bathing/dressing/bathroom   Equipment Recommendations  None recommended by OT    Recommendations for Other Services      Precautions / Restrictions Precautions Precautions: Fall;Other (comment) Precaution Comments: vent, ETT, NGT, ileostomy, Rt radial art line Restrictions Weight Bearing Restrictions: No       Mobility Bed Mobility Overal bed mobility: Needs Assistance Bed Mobility: Rolling, Sidelying to Sit Rolling: Mod assist, +2 for physical assistance Sidelying to sit: Mod assist, +2 for physical assistance   Sit to supine: Max assist, +2 for physical assistance     Patient Response: Cooperative  Transfers Overall transfer level: Needs assistance   Transfers: Sit to/from Stand Sit to Stand: Min assist, +2 physical assistance                 Balance Overall balance assessment: Needs assistance Sitting-balance support: Feet supported Sitting balance-Leahy Scale: Fair     Standing balance support: Bilateral upper extremity supported Standing balance-Leahy Scale: Poor                             ADL either performed or assessed with clinical judgement   ADL Overall ADL's : Needs assistance/impaired Eating/Feeding: NPO   Grooming: Minimal assistance;Bed level   Upper Body Bathing: Moderate assistance;Bed level   Lower Body Bathing: Maximal assistance;Bed level   Upper Body Dressing : Moderate assistance;Sitting;Maximal assistance   Lower Body Dressing: Bed level;Maximal assistance                      Extremity/Trunk Assessment Upper Extremity Assessment Upper Extremity Assessment: Generalized weakness   Lower Extremity Assessment Lower Extremity Assessment: Defer to PT evaluation   Cervical / Trunk Assessment Cervical / Trunk Assessment:  Normal    Vision Patient Visual Report: No change from baseline     Perception Perception Perception: Not tested   Praxis Praxis Praxis: Not  tested    Cognition Arousal/Alertness: Awake/alert Behavior During Therapy: WFL for tasks assessed/performed Overall Cognitive Status: Difficult to assess                                                       General Comments  VSS    Pertinent Vitals/ Pain       Pain Assessment Pain Assessment: Faces Faces Pain Scale: Hurts little more Pain Location: upper lip/face Pain Descriptors / Indicators: Discomfort, Grimacing Pain Intervention(s): Monitored during session                                                          Frequency  Min 2X/week        Progress Toward Goals  OT Goals(current goals can now be found in the care plan section)  Progress towards OT goals: Goals drowngraded-see care plan  Acute Rehab OT Goals Time For Goal Achievement: 02/12/22 Potential to Achieve Goals: Good ADL Goals Pt Will Perform Grooming: with supervision;sitting Pt Will Perform Upper Body Dressing: with min guard assist;sitting Pt Will Perform Lower Body Dressing: with min assist;sitting/lateral leans Pt Will Transfer to Toilet: with min assist;stand pivot transfer;bedside commode  Plan Discharge plan needs to be updated    Co-evaluation    PT/OT/SLP Co-Evaluation/Treatment: Yes Reason for Co-Treatment: Complexity of the patient's impairments (multi-system involvement)   OT goals addressed during session: ADL's and self-care      AM-PAC OT "6 Clicks" Daily Activity     Outcome Measure   Help from another person eating meals?: Total Help from another person taking care of personal grooming?: A Little Help from another person toileting, which includes using toliet, bedpan, or urinal?: A Lot Help from another person bathing (including washing, rinsing, drying)?: A Lot Help from another person to put on and taking off regular upper body clothing?: A Lot Help from another person to put on and taking off regular lower body  clothing?: A Lot 6 Click Score: 12    End of Session    OT Visit Diagnosis: Other abnormalities of gait and mobility (R26.89);Other symptoms and signs involving cognitive function;Muscle weakness (generalized) (M62.81)   Activity Tolerance Patient tolerated treatment well   Patient Left in bed;with call bell/phone within reach   Nurse Communication Mobility status        Time: 2197-5883 OT Time Calculation (min): 28 min  Charges: OT General Charges $OT Visit: 1 Visit OT Evaluation $OT Re-eval: 1 Re-eval  01/29/2022  RP, OTR/L  Acute Rehabilitation Services  Office:  [phone]   Metta Clines 01/29/2022, 10:45 AM

## 2022-01-29 NOTE — Progress Notes (Signed)
RN stated [name] not working right. RT attempted to trouble shoot [name] with no success. MD made aware and stated to remove [name]. RT removed [name] from pt.

## 2022-01-29 NOTE — Progress Notes (Signed)
PHARMACY - TOTAL PARENTERAL NUTRITION CONSULT NOTE  Indication: Intolerance to enteral feeding / anastomotic leak  Patient Measurements: Height: 5' (152.4 cm) Weight: 78.4 kg (172 lb 13.5 oz) IBW/kg (Calculated) : 45.5 TPN AdjBW (KG): 51 Body mass index is 33.76 kg/m.  Assessment:  37 YOF admited for CAP causing respiratory failure, had a cardiac arrest after admission and was moved to the ICU.  Chest tube placed for parapneumonic effusion, extubated on 8/2. Sent out of ICU on 8/5. On 8/6 she developed ischemic colitis, initially treated conservatively but transferred back to ICU for worsening shock and confusion 8/7.  Now s/p ex lap, R colectomy for ischemic bowel with perforation. Pt was provided TPN from 8/9-8/15. Pt's diet was advanced and pt ate 25-50% of meals from 8/15-8/20. Diet was changed to CLD on 8/20 for IR placement of drain for anastomotic leak. However, drain was not placed because pt acutely decompensated and was intubated and admitted to ICU and made NPO on 8/21. IR placed drain on 8/22 and drainage was positive for enteric contents confirming anastomotic leak. Surgery plans to perform resection of anastomosis and possible diversion with ileostomy on 8/23. Pharmacy consulted to manage TPN.  Glucose / Insulin: A1c 4.9% - patient required 2 units of SSI  Electrolytes: K: 4.3, Cl: 117 (high), CO2: 24, corrected calcium: 10.9 (high), phos: 3.2,  calcium phos product: 34, goal < 55 others within normal limit  Renal: SCr < 1, BUN WNL  Hepatic: LFTs / tbili / TG WNL, albumin <1.5 Intake / Output; MIVF: UOP -73m, LBM 8/22, MIVF: LR 253mhr GI Imaging: 8/6 KUB: Mild gaseous distension favors ileus  8/6 CT: concerning for iscehmic colitis; possible SBO and/or inflammatory ileus  8/7 KUB: diffuse gaseous small bowel and colonic distension 8/20 CT: extraluminal collection of gas and fluid possibly representing an anastomotic leak, postop fluid collection, or abscess GI Surgeries /  Procedures:  8/7 Ex lap, colon resection 8/9 Ex lap, intestinal anastomosis, wound closure 8/22 CT-guided anterior RLQ abscess drain placement 8/23 Ex lap with resection of distal small bowel and ileocolonic anastomosis, creation of end ileostomy, and lysis of adhesions   Central access: PICC placed 01/12/22 TPN start date:  02/02/2022-01/19/22, restarted 8/23  Nutritional Goals: RD Estimated Needs Total Energy Estimated Needs: 1700-1900 kcals Total Protein Estimated Needs: 95-115 g Total Fluid Estimated Needs: >/= 1.5 L  Current Nutrition:  NPO and TPN   Plan:  TPN at 65 ml/hr to provide 104g AA, 234g CHO, 60.8g lipids, and 1821 kCal. Patient is meeting 100% of nutrition feeds via TPN.  Electrolytes in TPN: Decrease Na 8585mL, Increase K 3m72m, Ca 3mEq31m Increase Mg 12mEq80mDecrease Phos 25mmol68mand max acetate.  Add standard MVI and trace elements to TPN  Decrease SSI to sensitive Q8H.  Daily BMP, Mag and Phos until stable on TPN.   HeatherEsmeralda ArthurD  Clinical Pharmacist 01/29/2022 7:08 AM   Please refer to AMION for pharmacy phone number

## 2022-01-29 NOTE — Progress Notes (Signed)
[place] Progress Note Patient Name: LUCA BURSTON DOB: 08-02-1940 MRN: [id number]   Date of Service  01/29/2022  HPI/Events of Note  Heart rate 110's, MAP 94, Temp 99.6.  eICU Interventions  Okay for patient to get PRN Tylenol, Lopressor 2.5 mg iv Q 4 hours PRN heart rate > 110 ordered.        Kerry Kass Raelynn Corron 01/29/2022, 11:02 PM

## 2022-01-29 NOTE — Progress Notes (Signed)
Arterial line D/C earlier in shift due to incorrect reads. Per CCM, no need for ART line as long as levophed is 29mg or less.

## 2022-01-29 NOTE — Consult Note (Signed)
[place] Nurse Consult Note: Patient receiving care in [place] Patient seen and ABD wound evaluated by Margie Billet, PA-C  Reason for Consult: Vac initiation Wound type: surgical very close in proximity to the ostomy pouch. Pressure Injury POA: NA Measurement: 16 cm x 3 cm x 3 cm at the distal aspect of the wound. Tunnel at 12 o'clock measures 5 cm Wound bed: Pink with a small amount of fatty globules present.  Drainage (amount, consistency, odor) serosanguinous on dressing removed.  Periwound: intact Dressing procedure/placement/frequency: Dressing removed. One piece of black foam placed in the wound. Drape applied, immediate suction obtained at 125 mmHg.   Supplies at bedside. 2 large black foam dressing kits and 1 canister.  WOC will follow MWF  Jocelyn Lamer L. Tamala Julian, MSN, RN, [place], Lysle Pearl, [facility] Wound Treatment Associate Pager [phone]

## 2022-01-29 NOTE — Progress Notes (Signed)
[facility] Progress Note  2 Days Post-Op  Subjective: CC-  Weaning vent. Levo down to 2. No ileostomy output.  Objective: Vital signs in last 24 hours: Temp:  [98.6 F (37 C)-99.5 F (37.5 C)] 99.5 F (37.5 C) (08/25 0000) Pulse Rate:  [74-134] 86 (08/25 0845) Resp:  [0-28] 13 (08/25 0845) BP: (95-103)/(49-55) 95/49 (08/24 1537) SpO2:  [93 %-100 %] 100 % (08/25 0845) Arterial Line BP: (84-141)/(45-86) 96/72 (08/25 0845) FiO2 (%):  [40 %] 40 % (08/25 0400) Weight:  [78.4 kg] 78.4 kg (08/25 0400) Last BM Date : 01/26/22  Intake/Output from previous day: 08/24 0701 - 08/25 0700 In: 2773 [I.V.:2402.5; IV Piggyback:370.5] Out: 760 [Urine:740; Stool:20] Intake/Output this shift: No intake/output data recorded.  PE: Gen:  sedated on the vent Card:  A fib, HR 80s Pulm: mechanically ventilated Abd: Soft, mild distension, open midline with healthy granulation tissue and no purulent drainage or cellulitis, ostomy viable with scant sweat and serous fluid in the bag/ no stool  Lab Results:  Recent Labs    01/28/22 0313 01/29/22 0326  WBC 17.3* 14.5*  HGB 13.2 9.7*  HCT 39.5 30.2*  PLT 356 250   BMET Recent Labs    01/28/22 0313 01/29/22 0322  NA 141 146*  K 4.9 4.3  CL 117* 117*  CO2 19* 24  GLUCOSE 213* 121*  BUN 19 23  CREATININE 0.86 0.68  CALCIUM 8.9 8.9   PT/INR No results for input(s): "LABPROT", "INR" in the last 72 hours. CMP     Component Value Date/Time   NA 146 (H) 01/29/2022 0322   NA 139 11/18/2021 1432   NA 134 (L) 04/25/2017 1216   K 4.3 01/29/2022 0322   K 4.3 04/25/2017 1216   CL 117 (H) 01/29/2022 0322   CO2 24 01/29/2022 0322   CO2 29 04/25/2017 1216   GLUCOSE 121 (H) 01/29/2022 0322   GLUCOSE 85 04/25/2017 1216   BUN 23 01/29/2022 0322   BUN 10 11/18/2021 1432   BUN 14.4 04/25/2017 1216   CREATININE 0.68 01/29/2022 0322   CREATININE 0.72 01/17/2018 1347   CREATININE 0.7 04/25/2017 1216   CALCIUM 8.9 01/29/2022 0322    CALCIUM 10.0 04/25/2017 1216   PROT 4.5 (L) 01/28/2022 0313   PROT 7.2 04/25/2017 1216   ALBUMIN <1.5 (L) 01/28/2022 0313   ALBUMIN 3.7 04/25/2017 1216   AST 22 01/28/2022 0313   AST 20 01/17/2018 1347   AST 21 04/25/2017 1216   ALT 23 01/28/2022 0313   ALT 10 01/17/2018 1347   ALT 10 04/25/2017 1216   ALKPHOS 50 01/28/2022 0313   ALKPHOS 75 04/25/2017 1216   BILITOT 0.8 01/28/2022 0313   BILITOT 0.5 01/17/2018 1347   BILITOT 0.65 04/25/2017 1216   GFRNONAA >60 01/29/2022 0322   GFRNONAA >60 01/17/2018 1347   GFRAA 90 04/24/2020 1235   GFRAA >60 01/17/2018 1347   Lipase     Component Value Date/Time   LIPASE 77 07/28/2008 1630       Studies/Results: DG CHEST PORT 1 VIEW  Result Date: 01/28/2022 CLINICAL DATA:  Endotracheal tube evaluation. EXAM: PORTABLE CHEST 1 VIEW COMPARISON:  Radiograph yesterday. FINDINGS: Endotracheal tube tip is 3.3 cm from the carina. Enteric tube in place, side-port below the diaphragm. There is a new right internal jugular central venous catheter tip overlying the mid SVC. Stable left upper extremity PICC. Improving right pleural effusion and basilar opacity. Stable retrocardiac opacity and small effusion. No pneumothorax. Stable heart size.  IMPRESSION: 1. New right internal jugular central venous catheter with tip overlying the mid SVC. Endotracheal and enteric tubes in place. 2. Improving right pleural effusion and basilar opacity. Stable retrocardiac opacity and small effusion. Electronically Signed   By: Keith Rake M.D.   On: 01/28/2022 00:03    Anti-infectives: Anti-infectives (From admission, onward)    Start     Dose/Rate Route Frequency Ordered Stop   01/26/22 1400  piperacillin-tazobactam (ZOSYN) IVPB 3.375 g        3.375 g 12.5 mL/hr over 240 Minutes Intravenous Every 8 hours 01/26/22 0945     01/12/2022 1030  vancomycin (VANCOREADY) IVPB 750 mg/150 mL  Status:  Discontinued        750 mg 150 mL/hr over 60 Minutes Intravenous  Every 24 hours 01/23/2022 0937 01/28/22 1520   01/20/2022 1000  vancomycin (VANCOREADY) IVPB 1500 mg/300 mL  Status:  Discontinued        1,500 mg 150 mL/hr over 120 Minutes Intravenous Every 48 hours 01/23/22 0909 02/04/2022 0937   01/23/22 1030  vancomycin (VANCOREADY) IVPB 1250 mg/250 mL        1,250 mg 166.7 mL/hr over 90 Minutes Intravenous  Once 01/23/22 0907 01/23/22 1259   01/23/22 1000  metroNIDAZOLE (FLAGYL) IVPB 500 mg  Status:  Discontinued        500 mg 100 mL/hr over 60 Minutes Intravenous Every 12 hours 01/23/22 0839 01/26/22 0945   01/23/22 1000  ceFEPIme (MAXIPIME) 2 g in sodium chloride 0.9 % 100 mL IVPB  Status:  Discontinued        2 g 200 mL/hr over 30 Minutes Intravenous Every 12 hours 01/23/22 0901 01/26/22 0945   01/09/2022 1400  micafungin (MYCAMINE) 100 mg in sodium chloride 0.9 % 100 mL IVPB  Status:  Discontinued        100 mg 105 mL/hr over 1 Hours Intravenous Every 24 hours 01/24/2022 1302 01/15/22 1211   01/10/22 1515  piperacillin-tazobactam (ZOSYN) IVPB 3.375 g        3.375 g 12.5 mL/hr over 240 Minutes Intravenous Every 8 hours 01/10/22 1427 01/17/2022 2359   01/04/22 2000  vancomycin (VANCOCIN) IVPB 1000 mg/200 mL premix  Status:  Discontinued        1,000 mg 200 mL/hr over 60 Minutes Intravenous Every 24 hours 01/03/22 1910 01/05/22 1321   01/03/22 2200  ceFEPIme (MAXIPIME) 2 g in sodium chloride 0.9 % 100 mL IVPB        2 g 200 mL/hr over 30 Minutes Intravenous Every 12 hours 01/03/22 1910 01/08/22 2209   01/03/22 1930  vancomycin (VANCOREADY) IVPB 1250 mg/250 mL        1,250 mg 166.7 mL/hr over 90 Minutes Intravenous  Once 01/03/22 1910 01/03/22 2219   01/02/22 2100  azithromycin (ZITHROMAX) 500 mg in sodium chloride 0.9 % 250 mL IVPB        500 mg 250 mL/hr  Intravenous Every 24 hours 01/03/2022 2248 01/06/22 2149   01/02/22 2000  cefTRIAXone (ROCEPHIN) 2 g in sodium chloride 0.9 % 100 mL IVPB  Status:  Discontinued        2 g 200 mL/hr over 30 Minutes  Intravenous Every 24 hours 12/30/2021 2248 01/03/22 1733   12/26/2021 2030  cefTRIAXone (ROCEPHIN) 1 g in sodium chloride 0.9 % 100 mL IVPB        1 g 200 mL/hr over 30 Minutes Intravenous  Once 12/25/2021 2025 12/15/2021 2141   12/09/2021 2030  azithromycin (ZITHROMAX) 500 mg  in sodium chloride 0.9 % 250 mL IVPB        500 mg 250 mL/hr over 60 Minutes Intravenous  Once 01/02/2022 2025 12/24/2021 2312        Assessment/Plan POD #18/16, S/p exploratory laparotomy,  R colectomy, abthera placement 01/24/2022 Dr. Rosendo Gros S/p exploratory laparotomy, intestinal anastomosis, abdominal closure 01/10/2022 Dr. Rosendo Gros  for ischemic bowel with perforation POD#2 S/p exploratory laparotomy, extensive LOA, resection distal small bowel and ileocolonic anastomosis, creation end ileostomy 8/23 Dr. Redmond Pulling - Continue TPN. Await ostomy output and weaning pressors prior to starting tube feedings - WOC consult for new ileostomy. Will also ask WOC to see for starting wound vac   FEN: NPO /OGT/ TNA  ID: Zosyn (stop date 8/14), cefepime/flagyl 8/19 >>8/21, Vanc/zosyn 8/21 --> VTE: SCD's, hold DOAC/ ok for heparin gtt if needed no bolus Foley: in place for strict I&Os Dispo: ICU   Below per TRH/CCM -- Septic shock due to above Acute hypoxic respiratory failure Cardiac arrest 7/30, 7 mins CPR Hx cardiomyopathy COPD Afib, permanent CAP    LOS: 28 days    [place], [facility] 01/29/2022, 9:37 AM Please see Amion for pager number during day hours 7:00am-4:30pm

## 2022-01-29 NOTE — Progress Notes (Addendum)
NAME:  [name], MRN:  [id number], DOB:  05-05-1941, LOS: 64 ADMISSION DATE:  12/31/2021, CONSULTATION DATE:  01/22/2022 REFERRING MD:  Bonner Puna - [facility] CHIEF COMPLAINT:  Hypotension, abdominal pain   History of Present Illness:  81 year old woman who presented to [facility] 7/28 with CAP causing respiratory failure. Suffered cardiac arrest 7/30 and transferred to ICU, intubated. CT placed for parapneumonic effusion 7/31, removed 8/2 and extubated. Transferred to floor 8/5.   On 8/6, patient developed ischemic colitis (initially treated conservatively) however patient clinically declined and PCCM was reconsulted 8/7 for worsening shock, confusion. CCS consulted. Exploratory laparotomy performed by general surgery 8/7 for ischemic colitis with perforation s/p right colectomy and repeat laparotomy on 8/9 with intestinal anastomosis and abdominal closure. Patient was extubated 8/11 and transferred to the general medical floor.   CT A/P was completed 8/20 demonstrating R sided abdominal fluid collection with c/f anastomotic leak. Patient underwent IR CT-guided drain placement with feculent contents consistent with anastomotic leak/feculent peritonitis. Rapid response was called 8/21 in the setting of respiratory distress; patient was reintubated and transferred back to the ICU. Underwent OR takeback 8/23 with CCS with findings of anastomotic leak with dense adhesions requiring extensive LOA and SBR as well as end ileostomy creation.  PCCM following for vent/medical management.  Pertinent Medical History:  Breast cancer on letrozole Atrial fibrillation  Significant Hospital Events: Including procedures, antibiotic start and stop dates in addition to other pertinent events   7/28 admit, Ceftriaxone/Azithro 7/29 last dose xeralto  7/30 IHCA x 7 min, Ceftriaxone>Cefepime 7/31 Chest tube placed, heparin gtt started 8/1 patient remove chest tube.  Head CT obtained >negative.  Severely agitated overnight.   Vancomycin stopped.  Azithromycin stopped. 8/6 Transferred to [facility] [place] 8/7 Called CCM back for abd, Distention , hypotension and a fib with RVR, plan is for  urgent OR pending goals of care discussion then ICU post op if husband is in agreement. 8/10 Abdomen closed yesterday, SBT/SAT today 8/11 Extubated yesterday, doing well on [place], up OOB, feeling alright 8/13 Transferred to medical floor 8/14 Chest ultrasound shows atelectasis without significant right basilar effusion. 8/20 CT A/P with fuid collection in R mid-abdomen with c/f anastomotic leak. 8/21 Back to ICU for resp distress 8/22 CT-guided drain placement (RLQ) for fluid assessment, abscess vs. Anastomotic leak 8/23 Taken back to OR for ex-lap/concern for anastomotic leak; extensive LOA with findings of feculent peritonitis with +leak; enterotomies requiring additional SBR and end ileostomy. 8/24 POD#1 from [place]. Unable to wean vent this AM. Following simple commands on Precedex 0.5, Levo 8. No output from new ileostomy. 8/25 POD#2 from [place]. Agitated overnight. Weaning on PSV 12/5. Awake, following commands. Levo 17mg. Scant serous output in ostomy bag. PMT family meeting 114  Interim History / Subjective:  Mildly agitated overnight requiring increased Precedex No other significant events overnight Awake, calm this morning Nodding appropriately to questions/following commands Levo requirement decreased to 251m, additional albumin 5% 50069miven Net +2L/24H, UOP 740m106mH PMT/GOC meeting with family, PMT, CCM, CCS at 1100 today  Objective:  Blood pressure (!) 95/49, pulse 76, temperature 99.5 F (37.5 C), temperature source Axillary, resp. rate 18, height 5' (1.524 m), weight 78.4 kg, SpO2 100 %.    Vent Mode: PRVC FiO2 (%):  [40 %] 40 % Set Rate:  [20 bmp] 20 bmp Vt Set:  [360 mL] 360 mL PEEP:  [5 cmH20] 5 cmH20 Plateau Pressure:  [11 cmH20-19 cmH20] 11 cmH20   Intake/Output Summary (Last 24  hours) at  01/29/2022 0804 Last data filed at 01/29/2022 0600 Gross per 24 hour  Intake 2667.05 ml  Output 735 ml  Net 1932.05 ml    Filed Weights   01/10/2022 0500 01/28/22 0430 01/29/22 0400  Weight: 74.1 kg 76 kg 78.4 kg   Physical Examination: General: Acute-on-chronically ill-appearing elderly woman in NAD. Appears mildly uncomfortable. HEENT: [place]/AT, anicteric sclera, PERRL, dry mucous membranes. ETT/OGT in place. Neuro:  Awake, nodding appropriately to questions.  Responds to verbal stimuli. Following commands consistently. Moves all 4 extremities spontaneously. CV: RRR, no m/g/r. PULM: Breathing even and unlabored on vent (PSV 12/5, FiO2 40%). Lung fields diminished bilaterally, R > L. GI: Soft, mildly distended, appropriately TTP postoperatively. Midline abdominal wound with fascia closed, skin open. WTD Kerlix in place. RUQ Ileostomy with scant serous output in bag, stoma pink and budding. Hypoactive bowel sounds. Extremities: Bilateral symmetric 1+ LE/UE edema noted. Skin: Warm/dry, no significant rashes. Mottling improved.  Resolved Hospital Problem List:     Assessment & Plan:  Septic Shock In setting of intrabdominal fluid collection and possible aspiration pneumonia with new dense RLL opacity. Bedside US 8/22 not concerning for effusion. - Goal MAP > 65 - Fluid resuscitation as tolerated, albumin 5% 520m - Levophed titrated to goal MAP, requiring less this AM - Trend WBC, fever curve - F/u Cx data - Continue broad-spectrum antibiotics (Vanc/Zosyn)  Acute Hypoxemic Respiratory Failure PAH group 2  CAP with parapneumonic effusion, s/p right pigtail - removed 8/1 Aspiration Pneumonia - Continue full vent support (4-8cc/kg IBW) - Weaning this AM on PSV 12/5, mental status improved; goal extubation in the next 24-48H pending tolerance - Wean FiO2 for O2 sat > 90% - Daily WUA/SBT - VAP bundle - Pulmonary hygiene - PAD protocol for sedation: Precedex and Fentanyl for goal RASS 0  to -1  Ischemic bowel with perf, s/p ex lap, intestinal anastomosis Intraabdominal fluid collection  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 8/22 CT-guided drain placement (RLQ) for fluid assessment, abscess vs. anastomotic leak. Taken back to OR 8/23 for ex-lap/concern for anastomotic leak; extensive LOA with findings of feculent peritonitis with +leak; enterotomies requiring additional SBR and end ileostomy. - Postoperative management per CCS - Abdominal midline incision open (skin), WTD dressings BID - Possible W[place] placement - Antibiotic coverage as above - S/p new ileostomy, WOC consulted   Metabolic Acidosis Hypokalemia Hypomagnesemia - Trend BMP - Replete electrolytes as indicated - Monitor I&Os, trend UOP - Avoid nephrotoxic agents as able - Ensure adequate renal perfusion  Afib - Hold diuretics/BB in the setting of shock, resume diuretics as able - Hold systemic AC in the setting of multiple procedural interventions - Cardiac monitoring  Acute anemia, symptomatic  Multifactorial in the setting of surgery, critical illness, iatrogenic losses. - Trend H&H - Monitor for signs of active bleeding - Transfuse for Hgb < 7.0 or hemodynamically significant bleeding  Severe protein calorie malnutrition  - Diet advancement per CCS, no enteral feeds until ileostomy output begins and patient's pressor needs have decreased - Continue TPN  GOC - PMT following, appreciate assistance - Family meeting with PMT, CCS, PCCM planned for 8/25 1100  Best Practice (right click and "Reselect all SmartList Selections" daily)   Diet/type: NPO DVT prophylaxis: SCD GI prophylaxis: PPI Lines: Central line Foley:  Yes, and it is still needed Code Status:  full code Last date of multidisciplinary goals of care discussion [n/a]  Critical care time:  38 minutes   The patient is critically ill with multiple organ system failure and  requires high complexity decision making for assessment and support, frequent evaluation and titration of therapies, advanced monitoring, review of radiographic studies and interpretation of complex data.   Critical Care Time devoted to patient care services, exclusive of separately billable procedures, described in this note is 38 minutes.  Lestine Mount, PA-C [facility] 01/29/22 8:04 AM  Please see [email or web address] for pager details.  From 7A-7P if no response, please call [phone] After hours, please call ELink [phone]

## 2022-01-29 NOTE — IPAL (Signed)
  Interdisciplinary Goals of Care Family Meeting   Date carried out: 01/29/2022  Location of the meeting: Conference room, [place]  Member's involved: Bedside Registered Nurse, Family Member or next of kin, Palliative care team member, and Other: Public relations account executive or acting medical decision maker: Husband, [name]; Okey Dupre; son, Sherren Mocha (present via phone)  Discussion: Myself, PMT NP Sharyn Lull, Care RN Kathlee Nations, RN Chase and patient's family discussed the current clinical status and GOC for [employer]. Patient's husband and stepson were present in-room and patient's son was present via phone. I provided a global clinical update for the family, summarizing Ms. Sirmon's hospital stay thus far as well as her numerous "steps forward/steps back". We discussed her current clinical status and trajectory. With the help of the PMT, able to compassionately discuss with family the recommendation for no resuscitation should Ms. Depriest's heart stop again. Family was appropriately tearful but very receptive to these recommendations and chose ultimately to make Ms. [name] DNR with continued current therapies. We discussed at length the criteria for extubation and our goal to optimize Jocelyn Lamer for successful extubation prior to trial with no plan for reintubation if failing, as patient's husband/son endorse she would not want tracheostomy/PEG.  Code status: DNR, no CPR/shocks/reintubation, continuing all current therapies  Disposition: Continue current acute care  Time spent for the meeting: 65 minutes  Please see Palliative Medicine Team note of same date for further details; appreciate assistance.  Lestine Mount, PA-C [facility] 01/29/22 12:38 PM  Please see [email or web address] for pager details.  From 7A-7P if no response, please call [phone] After hours, please call ELink [phone]

## 2022-01-29 NOTE — Progress Notes (Signed)
Updated son via phone. Estoria had a stable night. Abdominal wound looks good. Son spoke to mother through speaker phone patient nodded head yes.  Erling Conte, RN

## 2022-01-29 NOTE — Progress Notes (Addendum)
Physical Therapy Treatment/Re-eval Patient Details Name: BRENT NOTO MRN: [id number] DOB: 1940/07/08 Today's Date: 01/29/2022   History of Present Illness 81 y.o. female admitted 12/25/2021 with CAP.  7/30 AMS, head CT negative for acute abnormality. Code blue 7/30, ROSC achieved after 7 min CPR, pt intubated. Chest tube insertion 7/31; pt removed CT 8/1. ETT 7/30-8/2. Continued AMS, hallucinations overnight 8/2; head CT 8/2 remains negative for acute abnormality. EEG 8/2 with no seizure activity. Improved cognition 8/5, but persistent tachycardia/afib with RVR. 8/6 ischemic colitis. 8/7 shock with intubation and to OR for ex lap. 8/9 abdomen closure. 8/10 extubation. 8/21 respiratory distress with reintubation. 8/23 return to OR for ex lap and small bowel resection with ileostomy. PMHx:Afib, HTN, breast CA, melanoma, asthma, bilateral TKA, anxiety.    PT Comments    Pt with initial delayed response to arousal but then able to transition to sitting and standing with +2 assist. PT with vertical nystagmus and pinpoint pupils in sitting with pt reporting dizziness and blurry vision. Pt very appropriate, gesturing and following commands with pt able to stand for 2 min on vent prior to return to bed and perform bil LE HEP. Will update D/C plant and goals based on medical complexity with return to OR and vent. Pt will continue to benefit from acute therapy to maximize function and activity to decrease burden of care.   SpO2 100% on PS/CPAP at 40% FiO2, peep5 RR 20-30 during session HR 88-104 BP pre activity 116/72, sitting EOB 113/85, 96/55 with return to supine   Recommendations for follow up therapy are one component of a multi-disciplinary discharge planning process, led by the attending physician.  Recommendations may be updated based on patient status, additional functional criteria and insurance authorization.  Follow Up Recommendations  PT at Long-term acute care hospital Can patient physically  be transported by private vehicle: No   Assistance Recommended at Discharge Frequent or constant Supervision/Assistance  Patient can return home with the following Direct supervision/assist for medications management;Assistance with feeding;Assistance with cooking/housework;Direct supervision/assist for financial management;Assist for transportation;A lot of help with walking and/or transfers;A lot of help with bathing/dressing/bathroom   Equipment Recommendations  Rolling walker (2 wheels);BSC/3in1    Recommendations for Other Services       Precautions / Restrictions Precautions Precautions: Fall;Other (comment) Precaution Comments: vent, ETT, NGT, ileostomy, Rt radial art line     Mobility  Bed Mobility Overal bed mobility: Needs Assistance Bed Mobility: Rolling, Sidelying to Sit Rolling: Mod assist, +2 for physical assistance Sidelying to sit: Mod assist, +2 for physical assistance   Sit to supine: Max assist, +2 for physical assistance   General bed mobility comments: mod +2 with pad, cues and increased time to roll to right and rise from side with increased assist to clear legs off surface. Max +2 to return to bed for lines and safety    Transfers Overall transfer level: Needs assistance   Transfers: Sit to/from Stand Sit to Stand: Min assist, +2 physical assistance           General transfer comment: min +2 with bil UE support on therapist and pad to rise from bed, stand for 2 min and side step toward [facility]    Ambulation/Gait                   Stairs             Wheelchair Mobility    Modified Rankin (Stroke Patients Only)  Balance Overall balance assessment: Needs assistance   Sitting balance-Leahy Scale: Fair Sitting balance - Comments: minguard sitting EOB   Standing balance support: Bilateral upper extremity supported Standing balance-Leahy Scale: Poor Standing balance comment: heavy reliance on BUE support in standing                             Cognition Arousal/Alertness: Awake/alert Behavior During Therapy: WFL for tasks assessed/performed Overall Cognitive Status: Difficult to assess                                 General Comments: pt initially required increased time to arouse but was then able to follow commands with slight delay. Pt nodding responses and attempting to mouth words around ETT but difficult to determine what she was saying, communication board provided end of session        Exercises General Exercises - Lower Extremity Heel Slides: AROM, Both, 10 reps, Supine Straight Leg Raises: AAROM, Both, 10 reps, Supine    General Comments        Pertinent Vitals/Pain Pain Assessment Pain Assessment: CPOT Facial Expression: Relaxed, neutral Body Movements: Absence of movements Muscle Tension: Relaxed Compliance with ventilator (intubated pts.): Tolerating ventilator or movement Vocalization (extubated pts.): N/A CPOT Total: 0    Home Living                          Prior Function            PT Goals (current goals can now be found in the care plan section) Acute Rehab PT Goals PT Goal Formulation: With patient Time For Goal Achievement: 02/12/22 Potential to Achieve Goals: Fair Progress towards PT goals: Goals downgraded-see care plan    Frequency    Min 3X/week      PT Plan Discharge plan needs to be updated    Co-evaluation PT/OT/SLP Co-Evaluation/Treatment: Yes Reason for Co-Treatment: For patient/therapist safety;Complexity of the patient's impairments (multi-system involvement)          AM-PAC PT "6 Clicks" Mobility   Outcome Measure  Help needed turning from your back to your side while in a flat bed without using bedrails?: A Lot Help needed moving from lying on your back to sitting on the side of a flat bed without using bedrails?: A Lot Help needed moving to and from a bed to a chair (including a wheelchair)?:  Total Help needed standing up from a chair using your arms (e.g., wheelchair or bedside chair)?: Total Help needed to walk in hospital room?: Total Help needed climbing 3-5 steps with a railing? : Total 6 Click Score: 8    End of Session   Activity Tolerance: Patient tolerated treatment well Patient left: in bed;with call bell/phone within reach;with bed alarm set Nurse Communication: Mobility status PT Visit Diagnosis: Other abnormalities of gait and mobility (R26.89);Difficulty in walking, not elsewhere classified (R26.2);Muscle weakness (generalized) (M62.81)     Time: 6578-4696 PT Time Calculation (min) (ACUTE ONLY): 25 min  Charges:                        Bayard Males, PT Acute Rehabilitation Services Office: [place] 01/29/2022, 10:13 AM

## 2022-01-30 ENCOUNTER — Inpatient Hospital Stay (HOSPITAL_COMMUNITY): Payer: Medicare Other

## 2022-01-30 DIAGNOSIS — J189 Pneumonia, unspecified organism: Secondary | ICD-10-CM | POA: Diagnosis not present

## 2022-01-30 DIAGNOSIS — Z515 Encounter for palliative care: Secondary | ICD-10-CM | POA: Diagnosis not present

## 2022-01-30 DIAGNOSIS — J90 Pleural effusion, not elsewhere classified: Secondary | ICD-10-CM | POA: Diagnosis not present

## 2022-01-30 DIAGNOSIS — J9601 Acute respiratory failure with hypoxia: Secondary | ICD-10-CM | POA: Diagnosis not present

## 2022-01-30 LAB — CBC
HCT: 27 % — ABNORMAL LOW (ref 36.0–46.0)
HCT: 27.9 % — ABNORMAL LOW (ref 36.0–46.0)
Hemoglobin: 8.9 g/dL — ABNORMAL LOW (ref 12.0–15.0)
Hemoglobin: 8.9 g/dL — ABNORMAL LOW (ref 12.0–15.0)
MCH: 33.7 pg (ref 26.0–34.0)
MCH: 31.3 pg (ref 26.0–34.0)
MCHC: 33 g/dL (ref 30.0–36.0)
MCHC: 31.9 g/dL (ref 30.0–36.0)
MCV: 102.3 fL — ABNORMAL HIGH (ref 80.0–100.0)
MCV: 98.2 fL (ref 80.0–100.0)
Platelets: 238 10*3/uL (ref 150–400)
Platelets: 234 10*3/uL (ref 150–400)
RBC: 2.64 MIL/uL — ABNORMAL LOW (ref 3.87–5.11)
RBC: 2.84 MIL/uL — ABNORMAL LOW (ref 3.87–5.11)
RDW: 20.9 % — ABNORMAL HIGH (ref 11.5–15.5)
RDW: 20.3 % — ABNORMAL HIGH (ref 11.5–15.5)
WBC: 9.8 10*3/uL (ref 4.0–10.5)
WBC: 10.4 10*3/uL (ref 4.0–10.5)
nRBC: 0 % (ref 0.0–0.2)
nRBC: 0 % (ref 0.0–0.2)

## 2022-01-30 LAB — BASIC METABOLIC PANEL
Anion gap: 4 — ABNORMAL LOW (ref 5–15)
BUN: 23 mg/dL (ref 8–23)
CO2: 29 mmol/L (ref 22–32)
Calcium: 9.3 mg/dL (ref 8.9–10.3)
Chloride: 113 mmol/L — ABNORMAL HIGH (ref 98–111)
Creatinine, Ser: 0.7 mg/dL (ref 0.44–1.00)
GFR, Estimated: >60 mL/min (ref >60)
Glucose, Bld: 126 mg/dL — ABNORMAL HIGH (ref 70–99)
Potassium: 3.6 mmol/L (ref 3.5–5.1)
Sodium: 146 mmol/L — ABNORMAL HIGH (ref 135–145)

## 2022-01-30 LAB — CULTURE, BLOOD (ROUTINE X 2)
Culture: NO GROWTH
Culture: NO GROWTH
Special Requests: ADEQUATE
Special Requests: ADEQUATE

## 2022-01-30 LAB — MAGNESIUM: Magnesium: 1.8 mg/dL (ref 1.7–2.4)

## 2022-01-30 LAB — LACTATE DEHYDROGENASE, PLEURAL OR PERITONEAL FLUID: LD, Fluid: 59 U/L — ABNORMAL HIGH (ref 3–23)

## 2022-01-30 LAB — GLUCOSE, CAPILLARY
Glucose-Capillary: 109 mg/dL — ABNORMAL HIGH (ref 70–99)
Glucose-Capillary: >600 mg/dL (ref 70–99)
Glucose-Capillary: >600 mg/dL (ref 70–99)
Glucose-Capillary: 109 mg/dL — ABNORMAL HIGH (ref 70–99)
Glucose-Capillary: 121 mg/dL — ABNORMAL HIGH (ref 70–99)
Glucose-Capillary: 101 mg/dL — ABNORMAL HIGH (ref 70–99)

## 2022-01-30 LAB — PROTEIN, PLEURAL OR PERITONEAL FLUID: Total protein, fluid: <3 g/dL

## 2022-01-30 LAB — BODY FLUID CELL COUNT WITH DIFFERENTIAL
Eos, Fluid: 0 %
Lymphs, Fluid: 42 %
Monocyte-Macrophage-Serous Fluid: 7 % — ABNORMAL LOW (ref 50–90)
Neutrophil Count, Fluid: 51 % — ABNORMAL HIGH (ref 0–25)
Other Cells, Fluid: 1 %
Total Nucleated Cell Count, Fluid: 357 cu mm (ref 0–1000)

## 2022-01-30 LAB — GRAM STAIN

## 2022-01-30 LAB — PHOSPHORUS: Phosphorus: 3.5 mg/dL (ref 2.5–4.6)

## 2022-01-30 MED ORDER — MAGNESIUM SULFATE 2 GM/50ML IV SOLN
2.0000 g | Freq: Once | INTRAVENOUS | Status: AC
  Administered 2022-01-30: 2 g via INTRAVENOUS
  Filled 2022-01-30: qty 50

## 2022-01-30 MED ORDER — POTASSIUM CHLORIDE 20 MEQ PO PACK
40.0000 meq | PACK | Freq: Once | ORAL | Status: AC
  Administered 2022-01-30: 40 meq
  Filled 2022-01-30: qty 2

## 2022-01-30 MED ORDER — FENTANYL CITRATE PF 50 MCG/ML IJ SOSY: 25.0000 ug | PREFILLED_SYRINGE | INTRAMUSCULAR | Status: DC | PRN

## 2022-01-30 MED ORDER — METOPROLOL TARTRATE 5 MG/5ML IV SOLN
2.5000 mg | Freq: Four times a day (QID) | INTRAVENOUS | Status: DC
  Administered 2022-01-30 – 2022-02-02 (×10): 2.5 mg via INTRAVENOUS
  Filled 2022-01-30 (×8): qty 5

## 2022-01-30 MED ORDER — TRACE MINERALS CU-MN-SE-ZN 300-55-60-3000 MCG/ML IV SOLN
INTRAVENOUS | Status: AC
  Filled 2022-01-30: qty 696.8

## 2022-01-30 NOTE — Progress Notes (Signed)
[facility] Progress Note Patient Name: [name] DOB: 1941-06-07 MRN: [id number]   Date of Service  01/30/2022  HPI/Events of Note  PT with some abdominal discomfort from wound vac  eICU Interventions  Fentanyl drip discontinued as pt now extubated.  Placed order for PRN fentanyl 63mg IV  Will continue to monitor symptoms and adjust pain meds as warranted.         ABorden8/26/2023, 10:28 PM

## 2022-01-30 NOTE — Progress Notes (Signed)
PHARMACY - TOTAL PARENTERAL NUTRITION CONSULT NOTE  Indication: Intolerance to enteral feeding / anastomotic leak  Patient Measurements: Height: 5' (152.4 cm) Weight: 80 kg (176 lb 5.9 oz) IBW/kg (Calculated) : 45.5 TPN AdjBW (KG): 51 Body mass index is 34.44 kg/m.  Assessment:  22 YOF admited for CAP causing respiratory failure, had a cardiac arrest after admission and was moved to the ICU.  Chest tube placed for parapneumonic effusion, extubated on 8/2. Sent out of ICU on 8/5. On 8/6 she developed ischemic colitis, initially treated conservatively but transferred back to ICU for worsening shock and confusion 8/7.  Now s/p ex lap, R colectomy for ischemic bowel with perforation. Pt was provided TPN from 8/9-8/15. Pt's diet was advanced and pt ate 25-50% of meals from 8/15-8/20. Diet was changed to CLD on 8/20 for IR placement of drain for anastomotic leak. However, drain was not placed because pt acutely decompensated and was intubated and admitted to ICU and made NPO on 8/21. IR placed drain on 8/22 and drainage was positive for enteric contents confirming anastomotic leak. Surgery plans to perform resection of anastomosis and possible diversion with ileostomy on 8/23. Pharmacy consulted to manage TPN.  Glucose / Insulin: A1c 4.9% - CBGs < 180 (CBG > 600 x 2 likely an error) Required 2 units of SSI  Electrolytes: K 3.6 (goal >/= 4), high Na/CL, CoCa high 11.3 (Ca x Phos = 40, goal < 55), Mag 1.8 (goal >/= 2), others WNL Renal: SCr < 1, BUN WNL  Hepatic: LFTs / tbili / TG WNL, albumin <1.5 Intake / Output; MIVF: UOP 0.5 ml/kg/hr, NG 257m stool 2277m LR 2080mr, net +4.5L GI Imaging: 8/6 KUB: Mild gaseous distension favors ileus  8/6 CT: concerning for iscehmic colitis; possible SBO and/or inflammatory ileus  8/7 KUB: diffuse gaseous small bowel and colonic distension 8/20 CT: extraluminal collection of gas and fluid possibly representing an anastomotic leak, postop fluid collection, or  abscess GI Surgeries / Procedures:  8/7 Ex lap, colon resection 8/9 Ex lap, intestinal anastomosis, wound closure 8/22 CT-guided anterior RLQ abscess drain placement 8/23 Ex lap with resection of distal small bowel and ileocolonic anastomosis, creation of end ileostomy, and lysis of adhesions   Central access: PICC placed 01/12/22 TPN start date:  01/31/2022-01/19/22, restarted 8/23  Nutritional Goals: RD Estimated Needs Total Energy Estimated Needs: 1700-1900 kcals Total Protein Estimated Needs: 95-115 g Total Fluid Estimated Needs: >/= 1.5 L  Current Nutrition:  NPO and TPN   Plan:  Continue TPN at goal rate of 65 ml/hr to provide 104g AA, 234g CHO, 61g lipids, and 1821 kCal, meeting 100% of nutrition feeds Electrolytes in TPN: decrease Na further to 78m78m, increase K further to 60mE31m(= 94mEq40m), remove Ca 8/26, increase Mg 15mEq/59m ~2.5g/day), decrease Phos further to 22mmol/58max acetate for now Add standard MVI and trace elements to TPN  Continue sensitive SSI Q8H for now - consider stopping if CBGs remain stable KCL 40mEq PT30m Mag 2gm IV already ordered Standard TPN labs on Mon/Thurs  F/u clinical progress (patient does not want trach/peg per IPAL discussion)  Taila Basinski D. Jveon Pound, PhaMina Marble BCPS, BCCCP 8/2[place]3, 7:17 AM

## 2022-01-30 NOTE — Procedures (Signed)
Thoracentesis  Procedure Note  [name]  [id number]  14-Jul-1940  Date:01/30/22  Time:10:10 AM   Provider Performing:Messiyah Waterson   Procedure: Thoracentesis with imaging guidance (03546)  Indication(s) Pleural Effusion  Consent Risks of the procedure as well as the alternatives and risks of each were explained to the patient and/or caregiver.  Consent for the procedure was obtained and is signed in the bedside chart  Anesthesia Topical only with 1% lidocaine    Time Out Verified patient identification, verified procedure, site/side was marked, verified correct patient position, special equipment/implants available, medications/allergies/relevant history reviewed, required imaging and test results available.   Sterile Technique Maximal sterile technique including full sterile barrier drape, hand hygiene, sterile gown, sterile gloves, mask, hair covering, sterile ultrasound probe cover (if used).  Procedure Description Ultrasound was used to identify appropriate pleural anatomy for placement and overlying skin marked.  Area of drainage cleaned and draped in sterile fashion. Lidocaine was used to anesthetize the skin and subcutaneous tissue.  600 cc's of straw appearing fluid was drained from the right pleural space. Catheter then removed and bandaid applied to site.   Complications/Tolerance None; patient tolerated the procedure well. Chest X-ray is ordered to confirm no post-procedural complication.   EBL Minimal   Specimen(s) Pleural fluid  Gabriella Garfinkel MD [name] See Amion for pager  If no response to pager , please call [phone] until 7pm After 7:00 pm call Elink  [phone] 01/30/2022, 10:11 AM

## 2022-01-30 NOTE — Progress Notes (Signed)
NAME:  [name], MRN:  [id number], DOB:  1940/10/19, LOS: 72 ADMISSION DATE:  12/05/2021, CONSULTATION DATE:  01/20/2022 REFERRING MD:  Bonner Puna - [facility] CHIEF COMPLAINT:  Hypotension, abdominal pain   History of Present Illness:  81 year old woman who presented to [facility] 7/28 with CAP causing respiratory failure. Suffered cardiac arrest 7/30 and transferred to ICU, intubated. CT placed for parapneumonic effusion 7/31, removed 8/2 and extubated. Transferred to floor 8/5.   On 8/6, patient developed ischemic colitis (initially treated conservatively) however patient clinically declined and PCCM was reconsulted 8/7 for worsening shock, confusion. CCS consulted. Exploratory laparotomy performed by general surgery 8/7 for ischemic colitis with perforation s/p right colectomy and repeat laparotomy on 8/9 with intestinal anastomosis and abdominal closure. Patient was extubated 8/11 and transferred to the general medical floor.   CT A/P was completed 8/20 demonstrating R sided abdominal fluid collection with c/f anastomotic leak. Patient underwent IR CT-guided drain placement with feculent contents consistent with anastomotic leak/feculent peritonitis. Rapid response was called 8/21 in the setting of respiratory distress; patient was reintubated and transferred back to the ICU. Underwent OR takeback 8/23 with CCS with findings of anastomotic leak with dense adhesions requiring extensive LOA and SBR as well as end ileostomy creation.  PCCM following for vent/medical management.  Pertinent Medical History:  Breast cancer on letrozole Atrial fibrillation  Significant Hospital Events: Including procedures, antibiotic start and stop dates in addition to other pertinent events   7/28 admit, Ceftriaxone/Azithro 7/29 last dose xeralto  7/30 IHCA x 7 min, Ceftriaxone>Cefepime 7/31 Chest tube placed, heparin gtt started 8/1 patient remove chest tube.  Head CT obtained >negative.  Severely agitated overnight.   Vancomycin stopped.  Azithromycin stopped. 8/6 Transferred to [place] and [place] 8/7 Called CCM back for abd, Distention , hypotension and a fib with RVR, plan is for  urgent OR pending goals of care discussion then ICU post op if husband is in agreement. 8/10 Abdomen closed yesterday, SBT/SAT today 8/11 Extubated yesterday, doing well on [place], up OOB, feeling alright 8/13 Transferred to medical floor 8/14 Chest ultrasound shows atelectasis without significant right basilar effusion. 8/20 CT A/P with fuid collection in R mid-abdomen with c/f anastomotic leak. 8/21 Back to ICU for resp distress 8/22 CT-guided drain placement (RLQ) for fluid assessment, abscess vs. Anastomotic leak 8/23 Taken back to OR for ex-lap/concern for anastomotic leak; extensive LOA with findings of feculent peritonitis with +leak; enterotomies requiring additional SBR and end ileostomy. 8/24 POD#1 from [place]. Unable to wean vent this AM. Following simple commands on Precedex 0.5, Levo 8. No output from new ileostomy. 8/25 POD#2 from [place]. Agitated overnight. Weaning on PSV 12/5. Awake, following commands. Levo 47mg. Scant serous output in ostomy bag.  Made DNR after palliative meeting  Interim History / Subjective:   Continues on norepinephrine at 2 mcg.  On TPN Weaning on pressure support today  Objective:  Blood pressure 126/60, pulse (!) 105, temperature 98.6 F (37 C), resp. rate 17, height 5' (1.524 m), weight 80 kg, SpO2 100 %.    Vent Mode: PRVC FiO2 (%):  [40 %] 40 % Set Rate:  [20 bmp] 20 bmp Vt Set:  [360 mL] 360 mL PEEP:  [5 cmH20] 5 cmH20 Pressure Support:  [12 cmH20] 12 cmH20 Plateau Pressure:  [17 cmH20] 17 cmH20   Intake/Output Summary (Last 24 hours) at 01/30/2022 0905 Last data filed at 01/30/2022 0700 Gross per 24 hour  Intake 1864.92 ml  Output 1215 ml  Net  649.92 ml   Filed Weights   01/28/22 0430 01/29/22 0400 01/30/22 0500  Weight: 76 kg 78.4 kg 80 kg   Physical  Examination: Blood pressure 126/60, pulse (!) 105, temperature 98.6 F (37 C), resp. rate 17, height 5' (1.524 m), weight 80 kg, SpO2 100 %. Gen:      No acute distress, chronically ill-appearing HEENT:  EOMI, sclera anicteric Neck:     No masses; no thyromegaly, ET tube Lungs:    Clear to auscultation bilaterally; normal respiratory effort CV:         Regular rate and rhythm; no murmurs Abd:      Abdominal wound with dressing, ileostomy with pink stoma Ext:    1+ edema; adequate peripheral perfusion Skin:      Warm and dry; no rash Neuro: Awake, nods to commands  Labs/imaging reviewed Significant for sodium 146, BUN/creatinine 23/0.70 WBC 10.4, hemoglobin 8.9, platelets 234 Chest x-ray shows increase in right effusion.  Resolved Hospital Problem List:   Metabolic Acidosis Hypokalemia Hypomagnesemia  Assessment & Plan:  Septic Shock In setting of intrabdominal fluid collection and possible aspiration pneumonia with new dense RLL opacity.  Has increasing right effusion.  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 8/22 CT-guided drain placement (RLQ) for fluid assessment, abscess vs. anastomotic leak. Taken back to OR 8/23 for ex-lap/concern for anastomotic leak; extensive LOA with findings of feculent peritonitis with +leak; enterotomies requiring additional SBR and end ileostomy. Postoperative management per CCS Wound dressing to abdominal wounds Antibiotic coverage Surgery and wound care  following.   Afib Hold diuretics/BB in the setting of shock, resume diuretics as able Hold systemic AC in the setting of multiple procedural interventions Cardiac monitoring  Acute anemia, symptomatic  Multifactorial in the setting of surgery, critical illness, iatrogenic losses. Follow CBC, transfuse for hemoglobin less than 7  Severe protein calorie malnutrition  - Diet advancement per CCS, no enteral feeds until ileostomy output begins and patient's pressor needs have decreased Continue TPN  GOC Goals of care meeting on 8/25.  She is currently DNR.  Best Practice (right click and "Reselect all SmartList Selections" daily)   Diet/type: NPO DVT prophylaxis: SCD GI prophylaxis: PPI Lines: Central line Foley:  Yes, and it is still needed Code Status:  DNR Last date of multidisciplinary goals of care discussion [n/a]  Critical care time: 38 minutes   The patient is critically ill with multiple organ system failure and requires high complexity decision making for assessment and support, frequent evaluation and titration of therapies, advanced monitoring, review of radiographic studies and interpretation of complex data.   Critical Care Time devoted to patient care services, exclusive of separately billable procedures, described in this note is 38 minutes.  Marshell Garfinkel, MD [facility] 01/30/22 9:05 AM  Please see [email or web address] for pager details.  From 7A-7P if no response, please call [phone] After hours, please call ELink [phone]

## 2022-01-30 NOTE — Procedures (Signed)
Extubation Procedure Note  Patient Details:   Name: [name] DOB: Nov 09, 1940 MRN: [id number]   Airway Documentation:    Vent end date: 01/30/22 Vent end time: 1155   Evaluation  O2 sats: stable throughout Complications: No apparent complications Patient did tolerate procedure well. Bilateral Breath Sounds: Clear, Diminished   Yes   Pt extubated per physician order. Pt suctioned via ETT and orally, positive cuff leak at this time. Upon extubation pt placed on 3L nasal cannula. Pt able to speak name, give a good cough and no stridor heard at this time. RT will continue to monitor and be available as needed.  Jonathon Jordan Jessup Ogas 01/30/2022, 11:59 AM

## 2022-01-30 NOTE — Evaluation (Signed)
Clinical/Bedside Swallow Evaluation Patient Details  Name: [name] MRN: [id number] Date of Birth: [birth year]  Today's Date: 01/30/2022 Time: SLP Start Time (ACUTE ONLY): 58 SLP Stop Time (ACUTE ONLY): 1640 SLP Time Calculation (min) (ACUTE ONLY): 15 min  Past Medical History:  Past Medical History:  Diagnosis Date   Anemia    history of   Anxiety    Arthritis    Asthma    Breast cancer ([place])    Cancer ([place])    skin cancers, one melanoma    Complication of anesthesia    patient woke up during colonoscopy   Concussion    8 yrs. 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    Keratosis, actinic    Melanoma ([place])    Persistent atrial fibrillation ([place]) 12/16/2016   CHA2DS2VASC score 3   Pneumonia    hx of    Past Surgical History:  Past Surgical History:  Procedure Laterality Date   brachial skin removal due to deforimity Bilateral    BREAST ENHANCEMENT SURGERY Left 11/14/2018   Procedure: LEFT BREAST AUGMENTATION;  Surgeon: Gabriella Limbo, MD;  Location: [place];  Service: Plastics;  Laterality: Left;   BREAST RECONSTRUCTION Right 06/26/2018   BREAST RECONSTRUCTION WITH PLACEMENT OF TISSUE EXPANDER AND ALLODERM Right 06/26/2018   Procedure: RIGHT BREAST RECONSTRUCTION WITH PLACEMENT OF TISSUE EXPANDER;  Surgeon: Gabriella Limbo, MD;  Location: [facility];  Service: Plastics;  Laterality: Right;   BREAST RECONSTRUCTION WITH PLACEMENT OF TISSUE EXPANDER AND FLEX HD (ACELLULAR HYDRATED DERMIS) Right 01/28/2017    RIGHT BREAST RECONSTRUCTION WITH PLACEMENT OF TISSUE EXPANDER AND ALLODERM;  Surgeon: Gabriella Limbo, MD;  Location: [facility];   Service: Plastics;  Laterality: Right;   BUNIONECTOMY     left    CATARACT EXTRACTION     [place] cataract surgery    COLON RESECTION N/A 01/28/2022   Procedure: COLON RESECTION;  Surgeon: Gabriella Ok, MD;  Location: [facility];  Service: General;  Laterality: N/A;   COLONOSCOPY W/ POLYPECTOMY     JOINT REPLACEMENT     KNEE ARTHROSCOPY     left    LAPAROTOMY N/A 01/24/2022   Procedure: EXPLORATORY LAPAROTOMY;  Surgeon: Gabriella Ok, MD;  Location: [place];  Service: General;  Laterality: N/A;   LAPAROTOMY N/A 01/21/2022   Procedure: EXPLORATORY LAPAROTOMY WITH INTESTINAL ANASTOMOSIS AND ABDOMINAL CLOSURE;  Surgeon: Gabriella Ok, MD;  Location: [place];  Service: General;  Laterality: N/A;   LAPAROTOMY N/A 01/26/2022   Procedure: EXPLORATORY LAPAROTOMY WITH PARTIAL COLECTOMY , iLEOSTOMY;  Surgeon: Gabriella Pickerel, MD;  Location: [facility];  Service: General;  Laterality: N/A;   LATISSIMUS FLAP TO BREAST Right 06/26/2018   Procedure: LATISSIMUS FLAP TO RIGHT CHEST;  Surgeon: Gabriella Limbo, MD;  Location: [facility];  Service: Plastics;  Laterality: Right;   LYSIS OF ADHESION N/A 01/29/2022   Procedure: LYSIS OF ADHESIONS;  Surgeon: Gabriella Pickerel, MD;  Location: [place];  Service: General;  Laterality: N/A;   MASTECTOMY Right    MASTECTOMY W/ SENTINEL NODE BIOPSY Right 01/28/2017   Procedure: RIGHT TOTAL MASTECTOMY WITH SENTINEL LYMPH NODE BIOPSY;  Surgeon: Gabriella Seltzer, MD;  Location: [facility];  Service: General;  Laterality: Right;   MASTOPEXY Left 11/14/2018   Procedure: LEFT BREAST MASTOPEXY;  Surgeon: Gabriella Limbo, MD;  Location: [facility];  Service: Plastics;  Laterality: Left;   MELANOMA EXCISION Left    polyp removed from uterus      REMOVAL OF TISSUE EXPANDER AND PLACEMENT OF IMPLANT Right 11/14/2018   Procedure: REMOVAL OF RIGHT TISSUE EXPANDER AND PLACEMENT OF SALINE IMPLANT;  Surgeon: Gabriella Limbo, MD;  Location: [facility];  Service: Plastics;   Laterality: Right;   right wrist pinning      skin cancers removed     TISSUE EXPANDER PLACEMENT Right 02/27/2018   Procedure: REMOVAL OF TISSUE EXPANDER;  Surgeon: Gabriella Limbo, MD;  Location: [place];  Service: Plastics;  Laterality: Right;   TOTAL KNEE ARTHROPLASTY Right 01/29/2013   Procedure: RIGHT TOTAL KNEE ARTHROPLASTY;  Surgeon: Gabriella Alf, MD;  Location: [facility];  Service: Orthopedics;  Laterality: Right;   TOTAL KNEE ARTHROPLASTY Left 08/29/2017   Procedure: LEFT TOTAL KNEE ARTHROPLASTY;  Surgeon: Gabriella Arabian, MD;  Location: [facility];  Service: Orthopedics;  Laterality: Left;   HPI:  81 y.o. female admitted 12/23/2021 with CAP.  7/30 AMS, head CT negative for acute abnormality. Code blue 7/30, ROSC achieved after 7 min CPR, pt intubated. Chest tube insertion 7/31; pt removed CT 8/1. ETT 7/30-8/2. Continued AMS, hallucinations overnight 8/2; head CT 8/2 remains negative for acute abnormality. EEG 8/2 with no seizure activity. Improved cognition 8/5, but persistent tachycardia/afib with RVR. 8/6 ischemic colitis. 8/7 shock with intubation and to OR for ex lap. 8/9 abdomen closure. 8/10 extubation. 8/21 respiratory distress with reintubation. 8/23 return to OR for ex lap and small bowel resection with ileostomy. 8/26 extubation after consult with Palliative medicine; no plans for reintubation. Pt has been followed for swallowing since 8/3. Diet before last intubation was dysphagia 3/thin liquids. PMHx:Afib, HTN, breast CA, melanoma, asthma, bilateral TKA, anxiety.    Assessment / Plan / Recommendation  Clinical Impression  Ms [name] presents with a post-extubation dysphagia with concerns for inadequate glottal closure after most recent intubation. Her voice is quite hoarse and with low volume, a sign of glottal insufficiency.  She is at higher risk for silent aspiration.  Given circumstances and recency of extubation, recommend holding on POs for today excluding occasional ice chips.  D/W  pt and RN, [name], who was present for evaluation.  SLP will follow - pt may need instrumental swallow study before resuming an oral diet. SLP Visit Diagnosis: Dysphagia, oropharyngeal phase (R13.12)    Aspiration Risk  Severe aspiration risk    Diet Recommendation   NPO except ice chips       Other  Recommendations Oral Care Recommendations: Oral care QID    Recommendations for follow up therapy are one component of a multi-disciplinary discharge planning process, led by the attending physician.  Recommendations may be updated based on patient status, additional functional criteria and insurance authorization.  Follow up Recommendations        Assistance Recommended at Discharge Frequent or constant Supervision/Assistance  Functional Status Assessment Patient has had a recent decline in their functional status and demonstrates the ability to make significant improvements in function in a reasonable and predictable amount of time.  Frequency and Duration min 3x week  2 weeks       Prognosis Prognosis for Safe Diet Advancement: Good      Swallow Study  General Date of Onset: 12/29/2021 HPI: 81 y.o. female admitted 12/30/2021 with CAP.  7/30 AMS, head CT negative for acute abnormality. Code blue 7/30, ROSC achieved after 7 min CPR, pt intubated. Chest tube insertion 7/31; pt removed CT 8/1. ETT 7/30-8/2. Continued AMS, hallucinations overnight 8/2; head CT 8/2 remains negative for acute abnormality. EEG 8/2 with no seizure activity. Improved cognition 8/5, but persistent tachycardia/afib with RVR. 8/6 ischemic colitis. 8/7 shock with intubation and to OR for ex lap. 8/9 abdomen closure. 8/10 extubation. 8/21 respiratory distress with reintubation. 8/23 return to OR for ex lap and small bowel resection with ileostomy. 8/26 extubation after consult with Palliative medicine; no plans for reintubation. Pt has been followed for swallowing since 8/3. Diet before last intubation was dysphagia  3/thin liquids. PMHx:Afib, HTN, breast CA, melanoma, asthma, bilateral TKA, anxiety. Type of Study: Bedside Swallow Evaluation Previous Swallow Assessment: see HPi Diet Prior to this Study: NPO Temperature Spikes Noted: No Respiratory Status: Nasal cannula History of Recent Intubation: Yes Length of Intubations (days): 5 days Date extubated: 01/30/22 Behavior/Cognition: Alert Oral Cavity Assessment: Within Functional Limits Oral Care Completed by SLP: Recent completion by staff Oral Cavity - Dentition: Adequate natural dentition Self-Feeding Abilities: Needs assist Patient Positioning: Upright in chair Baseline Vocal Quality: Hoarse;Low vocal intensity Volitional Cough: Weak Volitional Swallow: Able to elicit    Oral/Motor/Sensory Function Overall Oral Motor/Sensory Function: Within functional limits   Ice Chips Ice chips: Within functional limits   Thin Liquid Thin Liquid: Impaired Presentation: Spoon;Straw Pharyngeal  Phase Impairments: Cough - Delayed    Nectar Thick Nectar Thick Liquid: Not tested   Honey Thick Honey Thick Liquid: Not tested   Puree Puree: Within functional limits   Solid     Solid: Not tested      [name] 01/30/2022,4:58 PM Estill Bamberg L. Tivis [name], MA CCC/SLP Clinical Specialist - [place] Office number [phone]

## 2022-01-30 NOTE — Progress Notes (Addendum)
Palliative Medicine Inpatient Follow Up Note   HPI: 81 y.o. female  with past medical history of breast cancer, atrial fibrillation, and anemia who was admitted admitted on 12/12/2021 with community-acquired pneumonia and acute respiratory failure.  On 7/30 she went into cardiac arrest and ROSC was achieved after 7 minutes.  Chest tube was placed for parapneumonic effusion, and she was extubated on 8/2.  Sent out of ICU on 8/5.  She underwent exploratory laparotomy on 8/7 for ischemic colitis with perforation and is s/p right colectomy.  She underwent repeat laparotomy on 8/9 with intestinal anastomosis and abdominal closure.  She was extubated 8/10 and transferred to the floor.  CT abdomen 8/20 showed right-sided abdominal fluid collection in which IR was consulted for drainage.  Rapid response was called 8/21 for respiratory distress in which she was intubated and transferred back to ICU.  Palliative medicine was consulted for goals of care in the setting of prolonged hospital stay, multiple complications, and complex medical history.  Today's Discussion 01/30/2022  *Please note that this is a verbal dictation therefore any spelling or grammatical errors are due to the "[place] [facility]" system interpretation.  Chart reviewed inclusive of vital signs, progress notes, laboratory results, and diagnostic images.   I met with patient's nurse, [name] this morning.  [name] expresses that [name] is far more awake and alert than she had been previously.  We discussed the plan for possible extubation today contingent upon the intensivist assessment.  We further reviewed that once [name] is extubated we will do a more formal cognitive assessment to identify whether or not she can participate in ongoing goals of care conversations.  I met with [name] at bedside this morning. She is far more alert and oriented. She is awake and able to write with a pen and paper. She is aware of who she is and where she is.  I am unable to assess much beyond this.  I shared with her that she may be extubated today and the hope that I could come back and speak to her if she remains stable.  There is no family at bedside this morning.  Questions and concerns addressed/Palliative Support Provided.  ________________________________ Addendum:  I went back to bedside this afternoon to speak with [name] as she had been successfully extubated.  She was alert and oriented x4.  Both patient's stepson and husband were present at bedside.   I was able to converse with her and share with her the conversation which was held yesterday regarding resuscitation status and reintubation.  She expresses that she does not want reintubation nor does she want resuscitation if she goes into respiratory or cardiac arrest.  We discussed in detail the long road ahead for improvement and [name] shares that she does hope to get better and go home to her back porch.  Patient was able to share with me more about her life and we reflected on the type of person that she is.  Palliative support provided.  Additional time: 45 minutes  Objective Assessment: Vital Signs Vitals:   01/30/22 0700 01/30/22 0756  BP: 126/60   Pulse: (!) 105   Resp: 17   Temp:  98.6 F (37 C)  SpO2: 100%     Intake/Output Summary (Last 24 hours) at 01/30/2022 0935 Last data filed at 01/30/2022 0700 Gross per 24 hour  Intake 1864.92 ml  Output 1215 ml  Net 649.92 ml    Last Weight  Most recent update: 01/30/2022  5:47  AM    Weight  80 kg (176 lb 5.9 oz)            Gen:  Frail elderly F  HEENT: ETT, OGT, dry MM CV: Irregular rate and rhythm  PULM: On mechanical ventilator ABD: Abdominal dressing, ostomy EXT: Generalized edema  Neuro: Awake and alert x2  SUMMARY OF RECOMMENDATIONS   DNAR --> when extubated plan to speak with patient further if coherent enough regarding yesterday's family meeting to verify she is in agreement   Patient is very  fragile and at high risk for acute decompensation    Ongoing Palliative team support  Billing based on MDM: High ______________________________________________________________________________________ [place] Team Team Cell Phone: [phone] Please utilize secure chat with additional questions, if there is no response within 30 minutes please call the above phone number  Palliative Medicine Team providers are available by phone from 7am to 7pm daily and can be reached through the team cell phone.  Should this patient require assistance outside of these hours, please call the patient's attending physician.

## 2022-01-30 NOTE — Progress Notes (Signed)
[facility] Progress Note Patient Name: [name] DOB: January 26, 1941 MRN: [id number]   Date of Service  01/30/2022  HPI/Events of Note  K+ 3.6, Mg++ 1.8, Cr 0.70  eICU Interventions  Electrolytes replaced per E-Link electrolyte replacement protocol.        Frederik Pear 01/30/2022, 6:19 AM

## 2022-01-30 NOTE — Progress Notes (Signed)
[facility] Progress Note  3 Days Post-Op  Subjective: CC-  Awake on the vent. Weaning and may extubated today. Levo 2. Nods yes that she is having some abdominal pain.  NG with 200cc out last 24 hours. Ostomy with 200cc thin bilious output.  Objective: Vital signs in last 24 hours: Temp:  [98.1 F (36.7 C)-99.6 F (37.6 C)] 98.6 F (37 C) (08/26 0756) Pulse Rate:  [64-124] 105 (08/26 0700) Resp:  [0-32] 17 (08/26 0700) BP: (80-161)/(42-122) 126/60 (08/26 0700) SpO2:  [94 %-100 %] 100 % (08/26 0700) Arterial Line BP: (77-113)/(68-94) 78/73 (08/25 1200) FiO2 (%):  [40 %] 40 % (08/26 0333) Weight:  [80 kg] 80 kg (08/26 0500) Last BM Date : 01/26/22  Intake/Output from previous day: 08/25 0701 - 08/26 0700 In: 2039.8 [I.V.:1888.9; IV Piggyback:150.9] Out: 1315 [Urine:890; Emesis/NG output:200; Stool:225] Intake/Output this shift: No intake/output data recorded.  PE: Gen:  Awake on the vent Card:  A fib, HR 890s Pulm: mechanically ventilated Abd: Soft, mild distension, mild diffuse tenderness but no peritonitis, vac to midline with good seal, ostomy viable with small amount of thin bilious fluid in bag  Lab Results:  Recent Labs    01/30/22 0337 01/30/22 0449  WBC 9.8 10.4  HGB 8.9* 8.9*  HCT 27.0* 27.9*  PLT 238 234   BMET Recent Labs    01/29/22 0322 01/30/22 0449  NA 146* 146*  K 4.3 3.6  CL 117* 113*  CO2 24 29  GLUCOSE 121* 126*  BUN 23 23  CREATININE 0.68 0.70  CALCIUM 8.9 9.3   PT/INR No results for input(s): "LABPROT", "INR" in the last 72 hours. CMP     Component Value Date/Time   NA 146 (H) 01/30/2022 0449   NA 139 11/18/2021 1432   NA 134 (L) 04/25/2017 1216   K 3.6 01/30/2022 0449   K 4.3 04/25/2017 1216   CL 113 (H) 01/30/2022 0449   CO2 29 01/30/2022 0449   CO2 29 04/25/2017 1216   GLUCOSE 126 (H) 01/30/2022 0449   GLUCOSE 85 04/25/2017 1216   BUN 23 01/30/2022 0449   BUN 10 11/18/2021 1432   BUN 14.4 04/25/2017 1216    CREATININE 0.70 01/30/2022 0449   CREATININE 0.72 01/17/2018 1347   CREATININE 0.7 04/25/2017 1216   CALCIUM 9.3 01/30/2022 0449   CALCIUM 10.0 04/25/2017 1216   PROT 4.5 (L) 01/28/2022 0313   PROT 7.2 04/25/2017 1216   ALBUMIN <1.5 (L) 01/28/2022 0313   ALBUMIN 3.7 04/25/2017 1216   AST 22 01/28/2022 0313   AST 20 01/17/2018 1347   AST 21 04/25/2017 1216   ALT 23 01/28/2022 0313   ALT 10 01/17/2018 1347   ALT 10 04/25/2017 1216   ALKPHOS 50 01/28/2022 0313   ALKPHOS 75 04/25/2017 1216   BILITOT 0.8 01/28/2022 0313   BILITOT 0.5 01/17/2018 1347   BILITOT 0.65 04/25/2017 1216   GFRNONAA >60 01/30/2022 0449   GFRNONAA >60 01/17/2018 1347   GFRAA 90 04/24/2020 1235   GFRAA >60 01/17/2018 1347   Lipase     Component Value Date/Time   LIPASE 77 07/28/2008 1630       Studies/Results: DG Chest Port 1 View  Result Date: 01/30/2022 CLINICAL DATA:  Pleural effusion follow-up EXAM: PORTABLE CHEST 1 VIEW COMPARISON:  January 27, 2022 FINDINGS: The ETT is in good position. The NG tube terminates below today's film. The left PICC line and right central line are stable. No pneumothorax. The right-sided pleural effusion  with underlying opacity is larger in the interval. A smaller left effusion with underlying opacity is stable. No other interval changes. IMPRESSION: 1. Stable support apparatus as above. 2. The right pleural effusion is moderate to large and larger in the interval. A small effusion is stable. Opacities underlying the bilateral effusions. Electronically Signed   By: Dorise Bullion III M.D.   On: 01/30/2022 08:24    Anti-infectives: Anti-infectives (From admission, onward)    Start     Dose/Rate Route Frequency Ordered Stop   01/26/22 1400  piperacillin-tazobactam (ZOSYN) IVPB 3.375 g        3.375 g 12.5 mL/hr over 240 Minutes Intravenous Every 8 hours 01/26/22 0945     01/09/2022 1030  vancomycin (VANCOREADY) IVPB 750 mg/150 mL  Status:  Discontinued        750 mg 150  mL/hr over 60 Minutes Intravenous Every 24 hours 02/02/2022 0937 01/28/22 1520   01/24/2022 1000  vancomycin (VANCOREADY) IVPB 1500 mg/300 mL  Status:  Discontinued        1,500 mg 150 mL/hr over 120 Minutes Intravenous Every 48 hours 01/23/22 0909 01/06/2022 0937   01/23/22 1030  vancomycin (VANCOREADY) IVPB 1250 mg/250 mL        1,250 mg 166.7 mL/hr over 90 Minutes Intravenous  Once 01/23/22 0907 01/23/22 1259   01/23/22 1000  metroNIDAZOLE (FLAGYL) IVPB 500 mg  Status:  Discontinued        500 mg 100 mL/hr over 60 Minutes Intravenous Every 12 hours 01/23/22 0839 01/26/22 0945   01/23/22 1000  ceFEPIme (MAXIPIME) 2 g in sodium chloride 0.9 % 100 mL IVPB  Status:  Discontinued        2 g 200 mL/hr over 30 Minutes Intravenous Every 12 hours 01/23/22 0901 01/26/22 0945   01/24/2022 1400  micafungin (MYCAMINE) 100 mg in sodium chloride 0.9 % 100 mL IVPB  Status:  Discontinued        100 mg 105 mL/hr over 1 Hours Intravenous Every 24 hours 01/12/2022 1302 01/15/22 1211   01/10/22 1515  piperacillin-tazobactam (ZOSYN) IVPB 3.375 g        3.375 g 12.5 mL/hr over 240 Minutes Intravenous Every 8 hours 01/10/22 1427 01/12/2022 2359   01/04/22 2000  vancomycin (VANCOCIN) IVPB 1000 mg/200 mL premix  Status:  Discontinued        1,000 mg 200 mL/hr over 60 Minutes Intravenous Every 24 hours 01/03/22 1910 01/05/22 1321   01/03/22 2200  ceFEPIme (MAXIPIME) 2 g in sodium chloride 0.9 % 100 mL IVPB        2 g 200 mL/hr over 30 Minutes Intravenous Every 12 hours 01/03/22 1910 01/08/22 2209   01/03/22 1930  vancomycin (VANCOREADY) IVPB 1250 mg/250 mL        1,250 mg 166.7 mL/hr over 90 Minutes Intravenous  Once 01/03/22 1910 01/03/22 2219   01/02/22 2100  azithromycin (ZITHROMAX) 500 mg in sodium chloride 0.9 % 250 mL IVPB        500 mg 250 mL/hr  Intravenous Every 24 hours 12/28/2021 2248 01/06/22 2149   01/02/22 2000  cefTRIAXone (ROCEPHIN) 2 g in sodium chloride 0.9 % 100 mL IVPB  Status:  Discontinued        2  g 200 mL/hr over 30 Minutes Intravenous Every 24 hours 12/28/2021 2248 01/03/22 1733   01/03/2022 2030  cefTRIAXone (ROCEPHIN) 1 g in sodium chloride 0.9 % 100 mL IVPB        1 g 200 mL/hr over  30 Minutes Intravenous  Once 12/06/2021 2025 12/05/2021 2141   01/04/2022 2030  azithromycin (ZITHROMAX) 500 mg in sodium chloride 0.9 % 250 mL IVPB        500 mg 250 mL/hr over 60 Minutes Intravenous  Once 12/31/2021 2025 12/31/2021 2312        Assessment/Plan POD #19/17, S/p exploratory laparotomy,  R colectomy, abthera placement 01/12/2022 Dr. Rosendo Gros S/p exploratory laparotomy, intestinal anastomosis, abdominal closure 01/21/2022 Dr. 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 Redmond Pulling - Ostomy with some thin bilious output and NG with low volume output. Ok to start trickle tube feedings from ccs perspective if not extubated. Continue TPN. - WOC consult for new ileostomy.  - Vac MWF - continue zosyn x5 days postop (end date 8/28)   FEN: NPO /OGT/ TNA  ID: Zosyn (stop date 8/14), cefepime/flagyl 8/19 >>8/21, Vanc/zosyn 8/21 --> VTE: SCD's, hold DOAC/ ok for heparin gtt if needed no bolus Foley: in place for strict I&Os Dispo: ICU   Below per TRH/CCM -- Septic shock due to above Acute hypoxic respiratory failure Cardiac arrest 7/30, 7 mins CPR Hx cardiomyopathy COPD Afib, permanent CAP DNR    LOS: 54 days    Wellington Hampshire, [facility] 01/30/2022, 8:27 AM Please see Amion for pager number during day hours 7:00am-4:30pm

## 2022-01-30 NOTE — Progress Notes (Addendum)
2000: Fentanyl qtt stopped and [facility] consulted for clarification of order.  2028: UpdatePola Corn MD clarified for the discontinuation of fentanyl qtt and switched patient to PRN fentanyl via IV.    2045: Wasted of Fentanyl with [facility], RN.  0300: Consulted [facility] d/t multiple CBG order sets showing up on task list.  0315: Update, Pharmacy to update orders CBG to be Q8h to match TPN orderset.  0425: Contacted [facility] d/t pt having increased anxiety, pt stated that she takes xanax at home to help with anxiety. Requested medications for pt. Waiting on MD to reply.   2130: See new orders for updated medication list  0530: Per patient request the on call chaplin was called x2 at [phone].

## 2022-01-30 NOTE — Progress Notes (Signed)
   D/w nursing and palliative care at bedside- plan for trial of extubation today. Noted to have some RVR with afib - metoprolol started. LVEF 60-65% by limited echo on 8/14. No acute cardiac needs at this time. Last seen on 8/22.  [place] will sign off.   Medication Recommendations:  as above Other recommendations (labs, testing, etc):  none Follow up as an outpatient:  PRN  Pixie Casino, MD, [facility], [place] Director of the [facility] Lipid Disorders &  Cardiovascular Risk Reduction Clinic Diplomate of the American Board of Clinical Lipidology Attending Cardiologist  Direct Dial: [phone]  Fax: [phone]  Website:  [email or web address]

## 2022-01-31 ENCOUNTER — Inpatient Hospital Stay (HOSPITAL_COMMUNITY): Payer: Medicare Other

## 2022-01-31 DIAGNOSIS — Z515 Encounter for palliative care: Secondary | ICD-10-CM | POA: Diagnosis not present

## 2022-01-31 DIAGNOSIS — J9601 Acute respiratory failure with hypoxia: Secondary | ICD-10-CM | POA: Diagnosis not present

## 2022-01-31 LAB — BASIC METABOLIC PANEL
Anion gap: 4 — ABNORMAL LOW (ref 5–15)
BUN: 22 mg/dL (ref 8–23)
CO2: 32 mmol/L (ref 22–32)
Calcium: 9.2 mg/dL (ref 8.9–10.3)
Chloride: 110 mmol/L (ref 98–111)
Creatinine, Ser: 0.64 mg/dL (ref 0.44–1.00)
GFR, Estimated: >60 mL/min (ref >60)
Glucose, Bld: 106 mg/dL — ABNORMAL HIGH (ref 70–99)
Potassium: 4.3 mmol/L (ref 3.5–5.1)
Sodium: 146 mmol/L — ABNORMAL HIGH (ref 135–145)

## 2022-01-31 LAB — CBC
HCT: 32.4 % — ABNORMAL LOW (ref 36.0–46.0)
Hemoglobin: 10 g/dL — ABNORMAL LOW (ref 12.0–15.0)
MCH: 31 pg (ref 26.0–34.0)
MCHC: 30.9 g/dL (ref 30.0–36.0)
MCV: 100.3 fL — ABNORMAL HIGH (ref 80.0–100.0)
Platelets: 255 10*3/uL (ref 150–400)
RBC: 3.23 MIL/uL — ABNORMAL LOW (ref 3.87–5.11)
RDW: 19.8 % — ABNORMAL HIGH (ref 11.5–15.5)
WBC: 9.4 10*3/uL (ref 4.0–10.5)
nRBC: 0 % (ref 0.0–0.2)

## 2022-01-31 LAB — GLUCOSE, CAPILLARY
Glucose-Capillary: 94 mg/dL (ref 70–99)
Glucose-Capillary: 106 mg/dL — ABNORMAL HIGH (ref 70–99)
Glucose-Capillary: 106 mg/dL — ABNORMAL HIGH (ref 70–99)
Glucose-Capillary: 124 mg/dL — ABNORMAL HIGH (ref 70–99)

## 2022-01-31 LAB — MAGNESIUM: Magnesium: 1.8 mg/dL (ref 1.7–2.4)

## 2022-01-31 MED ORDER — DEXMEDETOMIDINE HCL IN NACL 400 MCG/100ML IV SOLN
0.4000 ug/kg/h | INTRAVENOUS | Status: DC
  Administered 2022-01-31: 0.4 ug/kg/h via INTRAVENOUS
  Filled 2022-01-31: qty 100

## 2022-01-31 MED ORDER — MAGNESIUM SULFATE 4 GM/100ML IV SOLN
4.0000 g | Freq: Once | INTRAVENOUS | Status: AC
  Administered 2022-01-31: 4 g via INTRAVENOUS
  Filled 2022-01-31: qty 100

## 2022-01-31 MED ORDER — FENTANYL CITRATE PF 50 MCG/ML IJ SOSY
50.0000 ug | PREFILLED_SYRINGE | INTRAMUSCULAR | Status: DC | PRN
  Administered 2022-01-31 – 2022-02-04 (×4): 50 ug via INTRAVENOUS
  Filled 2022-01-31 (×4): qty 1

## 2022-01-31 MED ORDER — MIDAZOLAM HCL 2 MG/2ML IJ SOLN
0.5000 mg | INTRAMUSCULAR | Status: DC | PRN
  Administered 2022-01-31 – 2022-02-01 (×2): 0.5 mg via INTRAVENOUS
  Filled 2022-01-31 (×2): qty 2

## 2022-01-31 MED ORDER — FENTANYL CITRATE PF 50 MCG/ML IJ SOSY
25.0000 ug | PREFILLED_SYRINGE | INTRAMUSCULAR | Status: DC | PRN
  Administered 2022-02-01 – 2022-02-03 (×4): 25 ug via INTRAVENOUS
  Filled 2022-01-31 (×4): qty 1

## 2022-01-31 MED ORDER — FUROSEMIDE 10 MG/ML IJ SOLN
40.0000 mg | Freq: Once | INTRAMUSCULAR | Status: AC
  Administered 2022-01-31: 40 mg via INTRAVENOUS

## 2022-01-31 MED ORDER — FUROSEMIDE 10 MG/ML IJ SOLN
40.0000 mg | Freq: Once | INTRAMUSCULAR | Status: AC
  Administered 2022-01-31: 40 mg via INTRAVENOUS
  Filled 2022-01-31: qty 4

## 2022-01-31 MED ORDER — TRACE MINERALS CU-MN-SE-ZN 300-55-60-3000 MCG/ML IV SOLN
INTRAVENOUS | Status: AC
  Filled 2022-01-31: qty 696.8

## 2022-01-31 NOTE — Progress Notes (Signed)
Palliative Medicine Inpatient Follow Up Note  HPI: 81 y.o. female  with past medical history of breast cancer, atrial fibrillation, and anemia who was admitted admitted on 12/31/2021 with community-acquired pneumonia and acute respiratory failure.  On 7/30 she went into cardiac arrest and ROSC was achieved after 7 minutes.  Chest tube was placed for parapneumonic effusion, and she was extubated on 8/2.  Sent out of ICU on 8/5.  She underwent exploratory laparotomy on 8/7 for ischemic colitis with perforation and is s/p right colectomy.  She underwent repeat laparotomy on 8/9 with intestinal anastomosis and abdominal closure.  She was extubated 8/10 and transferred to the floor.  CT abdomen 8/20 showed right-sided abdominal fluid collection in which IR was consulted for drainage.  Rapid response was called 8/21 for respiratory distress in which she was intubated and transferred back to ICU.  Palliative medicine was consulted for goals of care in the setting of prolonged hospital stay, multiple complications, and complex medical history.  Today's Discussion 01/31/2022  *Please note that this is a verbal dictation therefore any spelling or grammatical errors are due to the "[place] One" system interpretation.  Chart reviewed inclusive of vital signs, progress notes, laboratory results, and diagnostic images.   I met with patient's nurse, Janett Billow this morning.  Janett Billow shares that Alexcia was able to sit up in the chair yesterday. She verbalizes that overall, Ilaisaane had done well though was concerned about patients lack of pain management since the discontinuation of the fentanyl drip. I shared that I'm happy to look over meds and make adjustments. In addition, Elisabeth has been anxious having an impending send of doom.   I confirmed with Dr. Vaughan Browner that we could help with pain management modifications.  I met with Aurielle, we focused on the positive steps that she has taken since her extubation. She  shares that she did have some pain overnight at her wounds vac site. We discussed that she is quite wheezy this morning and will alert RT. Patient is optimistic to get up today. She expresses that she did not see her son, Sherren Mocha last night though it hopeful to see him today.  Offered palliative support.   Objective Assessment: Vital Signs Vitals:   01/31/22 0914 01/31/22 0916  BP:    Pulse:    Resp:    Temp:    SpO2: 100% 100%    Intake/Output Summary (Last 24 hours) at 01/31/2022 0941 Last data filed at 01/31/2022 0900 Gross per 24 hour  Intake 2151.38 ml  Output 1575 ml  Net 576.38 ml    Last Weight  Most recent update: 01/31/2022  5:31 AM    Weight  78.4 kg (172 lb 13.5 oz)            Gen:  Frail elderly F  HEENT: Dry  CV: Irregular rate and rhythm  PULM: On mechanical ventilator ABD: Abdominal dressing, ostomy EXT: Generalized edema  Neuro: Awake and alert x2  SUMMARY OF RECOMMENDATIONS   DNAR/DNI --> No reintubation confirmed with patient and with family  Goals are for improvement --> though patient realizes she is in a very tenuous state and she is at high risk for further decline   Ongoing Palliative team support --> I will not be present tomorrow though I will request a team member follow up  Billing based on MDM: High ______________________________________________________________________________________ [place] Team Team Cell Phone: [phone] Please utilize secure chat with additional questions, if there is no  response within 30 minutes please call the above phone number  Palliative Medicine Team providers are available by phone from 7am to 7pm daily and can be reached through the team cell phone.  Should this patient require assistance outside of these hours, please call the patient's attending physician.

## 2022-01-31 NOTE — Progress Notes (Signed)
NAME:  [name], MRN:  [id number], DOB:  April 06, 1941, LOS: 73 ADMISSION DATE:  12/26/2021, CONSULTATION DATE:  01/19/2022 REFERRING MD:  Bonner Puna - [facility] CHIEF COMPLAINT:  Hypotension, abdominal pain   History of Present Illness:  81 year old woman who presented to [facility] 7/28 with CAP causing respiratory failure. Suffered cardiac arrest 7/30 and transferred to ICU, intubated. CT placed for parapneumonic effusion 7/31, removed 8/2 and extubated. Transferred to floor 8/5.   On 8/6, patient developed ischemic colitis (initially treated conservatively) however patient clinically declined and PCCM was reconsulted 8/7 for worsening shock, confusion. CCS consulted. Exploratory laparotomy performed by general surgery 8/7 for ischemic colitis with perforation s/p right colectomy and repeat laparotomy on 8/9 with intestinal anastomosis and abdominal closure. Patient was extubated 8/11 and transferred to the general medical floor.   CT A/P was completed 8/20 demonstrating R sided abdominal fluid collection with c/f anastomotic leak. Patient underwent IR CT-guided drain placement with feculent contents consistent with anastomotic leak/feculent peritonitis. Rapid response was called 8/21 in the setting of respiratory distress; patient was reintubated and transferred back to the ICU. Underwent OR takeback 8/23 with CCS with findings of anastomotic leak with dense adhesions requiring extensive LOA and SBR as well as end ileostomy creation.  PCCM following for vent/medical management.  Pertinent Medical History:  Breast cancer on letrozole Atrial fibrillation  Significant Hospital Events: Including procedures, antibiotic start and stop dates in addition to other pertinent events   7/28 admit, Ceftriaxone/Azithro 7/29 last dose xeralto  7/30 IHCA x 7 min, Ceftriaxone>Cefepime 7/31 Chest tube placed, heparin gtt started 8/1 patient remove chest tube.  Head CT obtained >negative.  Severely agitated overnight.   Vancomycin stopped.  Azithromycin stopped. 8/6 Transferred to [facility] [place] 8/7 Called CCM back for abd, Distention , hypotension and a fib with RVR, plan is for  urgent OR pending goals of care discussion then ICU post op if husband is in agreement. 8/10 Abdomen closed yesterday, SBT/SAT today 8/11 Extubated yesterday, doing well on [place], up OOB, feeling alright 8/13 Transferred to medical floor 8/14 Chest ultrasound shows atelectasis without significant right basilar effusion. 8/20 CT A/P with fuid collection in R mid-abdomen with c/f anastomotic leak. 8/21 Back to ICU for resp distress 8/22 CT-guided drain placement (RLQ) for fluid assessment, abscess vs. Anastomotic leak 8/23 Taken back to OR for ex-lap/concern for anastomotic leak; extensive LOA with findings of feculent peritonitis with +leak; enterotomies requiring additional SBR and end ileostomy. 8/24 POD#1 from [place]. Unable to wean vent this AM. Following simple commands on Precedex 0.5, Levo 8. No output from new ileostomy. 8/25 POD#2 from [place]. Agitated overnight. Weaning on PSV 12/5. Awake, following commands. Levo 34mg. Scant serous output in ostomy bag.  Made DNR after palliative meeting 8/26 Extubated. Right thoracentesis with removal of 600 cc  Interim History / Subjective:   Extubated yesterday after thoracentesis Has some anxiety overnight  Objective:  Blood pressure 132/69, pulse 87, temperature 98.8 F (37.1 C), temperature source Bladder, resp. rate 16, height 5' (1.524 m), weight 78.4 kg, SpO2 99 %.    Vent Mode: PSV;CPAP FiO2 (%):  [40 %] 40 % PEEP:  [5 cmH20] 5 cmH20 Pressure Support:  [5 cmH20] 5 cmH20   Intake/Output Summary (Last 24 hours) at 01/31/2022 0857 Last data filed at 01/31/2022 0600 Gross per 24 hour  Intake 1909.44 ml  Output 1575 ml  Net 334.44 ml   Filed Weights   01/29/22 0400 01/30/22 0500 01/31/22 0000  Weight:  78.4 kg 80 kg 78.4 kg   Physical Examination: Blood pressure  132/69, pulse 87, temperature 98.8 F (37.1 C), temperature source Bladder, resp. rate 16, height 5' (1.524 m), weight 78.4 kg, SpO2 99 %. Gen:      No acute distress chronically ill-appearing HEENT:  EOMI, sclera anicteric Neck:     No masses; no thyromegaly Lungs:    Scattered rhonchi CV:         Regular rate and rhythm; no murmurs Abd:      Abdominal wound with wound VAC, ostomy bag Ext:    Diffuse anasarca; adequate peripheral perfusion Skin:      Warm and dry; no rash Neuro: Awake, responsive  Labs/imaging reviewed Significant for 2146, creatinine 0.64 WBC 10, hemoglobin 255 Chest x-ray with low inspiratory volumes, bibasilar atelectasis and effusions.  Resolved Hospital Problem List:   Metabolic Acidosis Hypokalemia Hypomagnesemia  Assessment & Plan:  Septic Shock In setting of intrabdominal fluid collection and possible aspiration pneumonia with new dense RLL opacity.   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 8/22 CT-guided drain placement (RLQ) for fluid assessment, abscess vs. anastomotic leak. Taken back to OR 8/23 for ex-lap/concern for anastomotic leak; extensive LOA with findings of feculent peritonitis with +leak; enterotomies requiring additional SBR and end ileostomy.  Postoperative management per CCS Wound dressing to abdominal wounds Antibiotic coverage Surgery and wound care following.   Afib Resumed Lopressor IV on 8/26 Hold systemic AC in the setting of multiple procedural interventions.  Restart if she  continues to be stable Cardiac monitoring  Acute anemia, symptomatic  Multifactorial in the setting of surgery, critical illness, iatrogenic losses. Follow CBC, transfuse for hemoglobin less than 7  Severe protein calorie malnutrition  Diet advancement per CCS. Can consider placing cortrak tomorrow. Keep n.p.o. for now Continue TPN  GOC Goals of care meeting on 8/25.  She is currently DNR.  Best Practice (right click and "Reselect all SmartList Selections" daily)   Diet/type: NPO DVT prophylaxis: SCD GI prophylaxis: PPI Lines: Central line Foley:  Yes, and it is still needed Code Status:  DNR Last date of multidisciplinary goals of care discussion [n/a].  Husband and son updated at bedside.  Critical care time:    The patient is critically ill with multiple organ system failure and requires high complexity decision making for assessment and support, frequent evaluation and titration of therapies, advanced monitoring, review of radiographic studies and interpretation of complex data.   Critical Care Time devoted to patient care services, exclusive of separately billable procedures, described in this note is 35 minutes.  Marshell Garfinkel, MD [facility] 01/31/22 8:57 AM  Please see [email or web address] for pager details.  From 7A-7P if no response, please call [phone] After hours, please call ELink [phone]

## 2022-01-31 NOTE — Progress Notes (Signed)
[facility] Progress Note  4 Days Post-Op  Subjective: CC-  Extubated yesterday. On 1.5L [place]. Feeling a little SOB. Abdomen sore but not having severe pain. Denies any n/v. She is burping a little. 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 Severe aspiration risk per SLP, only on ice chips.  Objective: Vital signs in last 24 hours: Temp:  [98 F (36.7 C)-99.7 F (37.6 C)] 98.8 F (37.1 C) (08/27 0400) Pulse Rate:  [81-138] 81 (08/27 0600) Resp:  [9-31] 28 (08/27 0600) BP: (84-159)/(53-110) 131/86 (08/27 0600) SpO2:  [86 %-100 %] 96 % (08/27 0600) FiO2 (%):  [40 %] 40 % (08/26 1118) Weight:  [78.4 kg] 78.4 kg (08/27 0000) Last BM Date : 01/26/22  Intake/Output from previous day: 08/26 0701 - 08/27 0700 In: 1909.4 [I.V.:1721.5; IV Piggyback:188] Out: 9476 [Urine:1250; Drains:75; Stool:250] Intake/Output this shift: No intake/output data recorded.  PE: Gen:  Alert, NAD Card:  RRR Pulm: rate and effort normal, on 1.5L [place] Abd: Soft, minimal distension, appropriately tender, vac to midline with good seal, ostomy viable with small amount of thin bilious fluid in bag Msk: 2+ edema BUE/BLE  Lab Results:  Recent Labs    01/30/22 0449 01/31/22 0411  WBC 10.4 9.4  HGB 8.9* 10.0*  HCT 27.9* 32.4*  PLT 234 255   BMET Recent Labs    01/30/22 0449 01/31/22 0411  NA 146* 146*  K 3.6 4.3  CL 113* 110  CO2 29 32  GLUCOSE 126* 106*  BUN 23 22  CREATININE 0.70 0.64  CALCIUM 9.3 9.2   PT/INR No results for input(s): "LABPROT", "INR" in the last 72 hours. CMP     Component Value Date/Time   NA 146 (H) 01/31/2022 0411   NA 139 11/18/2021 1432   NA 134 (L) 04/25/2017 1216   K 4.3 01/31/2022 0411   K 4.3 04/25/2017 1216   CL 110 01/31/2022 0411   CO2 32 01/31/2022 0411   CO2 29 04/25/2017 1216   GLUCOSE 106 (H) 01/31/2022 0411   GLUCOSE 85 04/25/2017 1216   BUN 22 01/31/2022 0411   BUN 10 11/18/2021 1432   BUN 14.4 04/25/2017 1216   CREATININE 0.64  01/31/2022 0411   CREATININE 0.72 01/17/2018 1347   CREATININE 0.7 04/25/2017 1216   CALCIUM 9.2 01/31/2022 0411   CALCIUM 10.0 04/25/2017 1216   PROT 4.5 (L) 01/28/2022 0313   PROT 7.2 04/25/2017 1216   ALBUMIN <1.5 (L) 01/28/2022 0313   ALBUMIN 3.7 04/25/2017 1216   AST 22 01/28/2022 0313   AST 20 01/17/2018 1347   AST 21 04/25/2017 1216   ALT 23 01/28/2022 0313   ALT 10 01/17/2018 1347   ALT 10 04/25/2017 1216   ALKPHOS 50 01/28/2022 0313   ALKPHOS 75 04/25/2017 1216   BILITOT 0.8 01/28/2022 0313   BILITOT 0.5 01/17/2018 1347   BILITOT 0.65 04/25/2017 1216   GFRNONAA >60 01/31/2022 0411   GFRNONAA >60 01/17/2018 1347   GFRAA 90 04/24/2020 1235   GFRAA >60 01/17/2018 1347   Lipase     Component Value Date/Time   LIPASE 77 07/28/2008 1630       Studies/Results: DG Chest Port 1 View  Result Date: 01/31/2022 CLINICAL DATA:  Acute respiratory failure EXAM: PORTABLE CHEST 1 VIEW COMPARISON:  Prior chest x-ray yesterday 01/30/2022 FINDINGS: The patient has been extubated, the right central line removed and the nasogastric tube removed. The left upper extremity PICC remains in stable position with the tip overlying the right atrium.  Stable cardiac and mediastinal contours. Inspiratory volumes are very low. Increased streaky airspace opacities in the bases bilaterally consistent with atelectasis. Probable moderate right pleural effusion and small left pleural effusion. IMPRESSION: 1. Interval extubation, removal of gastric tube and right IJ central line. 2. Lower inspiratory volumes with increased bibasilar atelectasis. 3. Moderate right and small left pleural effusions. Electronically Signed   By: Jacqulynn Cadet M.D.   On: 01/31/2022 08:14   DG CHEST PORT 1 VIEW  Result Date: 01/30/2022 CLINICAL DATA:  [id number] status post thoracocentesis EXAM: PORTABLE CHEST 1 VIEW COMPARISON:  January 30, 2022 FINDINGS: The cardiomediastinal silhouette is unchanged in contour.ETT tip terminates  18 mm above the carina. Enteric tube tip and side port project over the proximal stomach. RIGHT neck CVC tip terminates over the expected region of superior cavoatrial junction. LEFT upper extremity PICC tip terminates over the expected region of the superior RIGHT atrium. Moderate RIGHT pleural effusion, decreased in comparison to prior. Small LEFT pleural effusion. No significant pneumothorax. Bilateral basilar predominant airspace opacities with mildly improved aeration of the RIGHT mid lung in comparison to prior. Visualized abdomen is unremarkable. IMPRESSION: Moderate RIGHT pleural effusion, decreased in comparison to prior. No significant pneumothorax is noted. Electronically Signed   By: Valentino Saxon M.D.   On: 01/30/2022 10:18   DG Chest Port 1 View  Result Date: 01/30/2022 CLINICAL DATA:  Pleural effusion follow-up EXAM: PORTABLE CHEST 1 VIEW COMPARISON:  January 27, 2022 FINDINGS: The ETT is in good position. The NG tube terminates below today's film. The left PICC line and right central line are stable. No pneumothorax. The right-sided pleural effusion with underlying opacity is larger in the interval. A smaller left effusion with underlying opacity is stable. No other interval changes. IMPRESSION: 1. Stable support apparatus as above. 2. The right pleural effusion is moderate to large and larger in the interval. A small effusion is stable. Opacities underlying the bilateral effusions. Electronically Signed   By: Dorise Bullion III M.D.   On: 01/30/2022 08:24    Anti-infectives: Anti-infectives (From admission, onward)    Start     Dose/Rate Route Frequency Ordered Stop   01/26/22 1400  piperacillin-tazobactam (ZOSYN) IVPB 3.375 g        3.375 g 12.5 mL/hr over 240 Minutes Intravenous Every 8 hours 01/26/22 0945 02/01/22 2359   01/22/2022 1030  vancomycin (VANCOREADY) IVPB 750 mg/150 mL  Status:  Discontinued        750 mg 150 mL/hr over 60 Minutes Intravenous Every 24 hours 01/30/2022  0937 01/28/22 1520   01/20/2022 1000  vancomycin (VANCOREADY) IVPB 1500 mg/300 mL  Status:  Discontinued        1,500 mg 150 mL/hr over 120 Minutes Intravenous Every 48 hours 01/23/22 0909 01/16/2022 0937   01/23/22 1030  vancomycin (VANCOREADY) IVPB 1250 mg/250 mL        1,250 mg 166.7 mL/hr over 90 Minutes Intravenous  Once 01/23/22 0907 01/23/22 1259   01/23/22 1000  metroNIDAZOLE (FLAGYL) IVPB 500 mg  Status:  Discontinued        500 mg 100 mL/hr over 60 Minutes Intravenous Every 12 hours 01/23/22 0839 01/26/22 0945   01/23/22 1000  ceFEPIme (MAXIPIME) 2 g in sodium chloride 0.9 % 100 mL IVPB  Status:  Discontinued        2 g 200 mL/hr over 30 Minutes Intravenous Every 12 hours 01/23/22 0901 01/26/22 0945   02/01/2022 1400  micafungin (MYCAMINE) 100 mg in sodium chloride 0.9 %  100 mL IVPB  Status:  Discontinued        100 mg 105 mL/hr over 1 Hours Intravenous Every 24 hours 01/24/2022 1302 01/15/22 1211   01/10/22 1515  piperacillin-tazobactam (ZOSYN) IVPB 3.375 g        3.375 g 12.5 mL/hr over 240 Minutes Intravenous Every 8 hours 01/10/22 1427 01/26/2022 2359   01/04/22 2000  vancomycin (VANCOCIN) IVPB 1000 mg/200 mL premix  Status:  Discontinued        1,000 mg 200 mL/hr over 60 Minutes Intravenous Every 24 hours 01/03/22 1910 01/05/22 1321   01/03/22 2200  ceFEPIme (MAXIPIME) 2 g in sodium chloride 0.9 % 100 mL IVPB        2 g 200 mL/hr over 30 Minutes Intravenous Every 12 hours 01/03/22 1910 01/08/22 2209   01/03/22 1930  vancomycin (VANCOREADY) IVPB 1250 mg/250 mL        1,250 mg 166.7 mL/hr over 90 Minutes Intravenous  Once 01/03/22 1910 01/03/22 2219   01/02/22 2100  azithromycin (ZITHROMAX) 500 mg in sodium chloride 0.9 % 250 mL IVPB        500 mg 250 mL/hr  Intravenous Every 24 hours 12/31/2021 2248 01/06/22 2149   01/02/22 2000  cefTRIAXone (ROCEPHIN) 2 g in sodium chloride 0.9 % 100 mL IVPB  Status:  Discontinued        2 g 200 mL/hr over 30 Minutes Intravenous Every 24 hours  12/08/2021 2248 01/03/22 1733   12/28/2021 2030  cefTRIAXone (ROCEPHIN) 1 g in sodium chloride 0.9 % 100 mL IVPB        1 g 200 mL/hr over 30 Minutes Intravenous  Once 12/26/2021 2025 01/02/2022 2141   12/13/2021 2030  azithromycin (ZITHROMAX) 500 mg in sodium chloride 0.9 % 250 mL IVPB        500 mg 250 mL/hr over 60 Minutes Intravenous  Once 12/15/2021 2025 12/18/2021 2312        Assessment/Plan POD #20/18, S/p exploratory laparotomy,  R colectomy, abthera placement 01/29/2022 Dr. Rosendo Gros S/p exploratory laparotomy, intestinal anastomosis, abdominal closure 01/09/2022 Dr. 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 Redmond Pulling - Ostomy with some thin bilious output. Severe aspiration risk per SLP, only on ice chips. Continue TPN for now. Could consider placing cortrak tomorrow and slowly transitioning to TF from TPN if she continues to have bowel function - WOC consult for new ileostomy - Vac MWF - continue zosyn x5 days postop (end date 8/28)   FEN: NPO except ice chips, TNA  ID: Zosyn (stop date 8/14), cefepime/flagyl 8/19 >>8/21, Vanc/zosyn 8/21 --> VTE: SCD's, hold DOAC/ ok for heparin gtt if needed no bolus Foley: in place for strict I&Os Dispo: ICU   Below per TRH/CCM -- Septic shock due to above Acute hypoxic respiratory failure Cardiac arrest 7/30, 7 mins CPR Hx cardiomyopathy COPD Afib, permanent CAP DNR    LOS: 30 days    Wellington Hampshire, [facility] 01/31/2022, 8:47 AM Please see Amion for pager number during day hours 7:00am-4:30pm

## 2022-01-31 NOTE — Progress Notes (Addendum)
PHARMACY - TOTAL PARENTERAL NUTRITION CONSULT NOTE  Indication: Intolerance to enteral feeding / anastomotic leak  Patient Measurements: Height: 5' (152.4 cm) Weight: 78.4 kg (172 lb 13.5 oz) IBW/kg (Calculated) : 45.5 TPN AdjBW (KG): 53.7 Body mass index is 33.76 kg/m.  Assessment:  60 YOF admited for CAP causing respiratory failure, had a cardiac arrest after admission and was moved to the ICU.  Chest tube placed for parapneumonic effusion, extubated on 8/2. Sent out of ICU on 8/5. On 8/6 she developed ischemic colitis, initially treated conservatively but transferred back to ICU for worsening shock and confusion 8/7.  Now s/p ex lap, R colectomy for ischemic bowel with perforation. Pt was provided TPN from 8/9-8/15. Pt's diet was advanced and pt ate 25-50% of meals from 8/15-8/20. Diet was changed to CLD on 8/20 for IR placement of drain for anastomotic leak. However, drain was not placed because pt acutely decompensated and was intubated and admitted to ICU and made NPO on 8/21. IR placed drain on 8/22 and drainage was positive for enteric contents confirming anastomotic leak. Surgery plans to perform resection of anastomosis and possible diversion with ileostomy on 8/23. Pharmacy consulted to manage TPN.  Glucose / Insulin: A1c 4.9% - CBGs < 180.  Required 0 unit of SSI  Electrolytes: high Na (was hypoNa earlier in admission), K WNL at 4.3 post 21mq PT, Mag 1.8 post 2gm IV (goal >/= 2 for ileus and Afib), CoCa high 11.2 (Ca x Phos = 39, goal < 55), others WNL  Renal: SCr < 1, BUN WNL  Hepatic: LFTs / tbili / TG WNL, albumin <1.5 Intake / Output; MIVF: UOP 0.7 ml/kg/hr, NG 037m drain 7524mstool 250m33mR 20ml11m net +4.5L GI Imaging: 8/6 KUB: Mild gaseous distension favors ileus  8/6 CT: concerning for iscehmic colitis; possible SBO and/or inflammatory ileus  8/7 KUB: diffuse gaseous small bowel and colonic distension 8/20 CT: extraluminal collection of gas and fluid possibly  representing an anastomotic leak, postop fluid collection, or abscess GI Surgeries / Procedures:  8/7 Ex lap, colon resection 8/9 Ex lap, intestinal anastomosis, wound closure 8/22 CT-guided anterior RLQ abscess drain placement 8/23 Ex lap with resection of distal small bowel and ileocolonic anastomosis, creation of end ileostomy, and lysis of adhesions   Central access: PICC placed 01/12/22 TPN start date:  01/12/2022-01/19/22, restarted 8/23  Nutritional Goals: RD Estimated Needs Total Energy Estimated Needs: 1700-1900 kcals Total Protein Estimated Needs: 95-115 g Total Fluid Estimated Needs: >/= 1.5 L  Current Nutrition:  NPO and TPN   Plan:  Continue TPN at goal rate of 65 ml/hr to provide 104g AA, 234g CHO, 61g lipids, and 1821 kCal, meeting 100% of nutrition needs Electrolytes in TPN: decrease Na 0mEq/9mK increased to 60mEq/84m 8/26 (= 94mEq/d23m Ca removed 8/26, max Mg at 15mEq/L 53m3g/day), decrease Phos to 22mmol/L 45m/26, max CL for now (= Cl:Ac 1:2) Add standard MVI and trace elements to TPN  D/C SSI/CBG checks Mag sulfate 4gm IV x 1 Standard TPN labs on Mon/Thurs F/u swallow eval and ability to start an oral diet vs placing cortrak for TF  Raeana Blinn D. Tiffancy Moger, PharMina MarbleBCPS, BCCCP 8/27[place], 7:29 AM

## 2022-01-31 NOTE — Progress Notes (Signed)
Speech Language Pathology Treatment: Dysphagia  Patient Details Name: [name] MRN: [id number] DOB: 09-Aug-1940 Today's Date: 01/31/2022 Time: 7262-0355 SLP Time Calculation (min) (ACUTE ONLY): 11 min  Assessment / Plan / Recommendation Clinical Impression  [name] was sitting in recliner, her husband at bedside. Her voice is a bit stronger/better quality today. She accepted sips of water with the appearance of a delay, no cough response. She has enjoyed ice today and demonstrates active mastication and deliberation when swallowing. Given her higher risk of silent aspiration, recommend proceeding with an instrumental swallow study - would like to proceed tomorrow.  Mr. And Mrs. [name] agree with plan.  D/W RN.  Continue NPO with ice chips for now. Plan is for potential cortrak tomorrow  - will plan for FEES next date.   HPI HPI: 81 y.o. female admitted 12/10/2021 with CAP.  7/30 AMS, head CT negative for acute abnormality. Code blue 7/30, ROSC achieved after 7 min CPR, pt intubated. Chest tube insertion 7/31; pt removed CT 8/1. ETT 7/30-8/2. Continued AMS, hallucinations overnight 8/2; head CT 8/2 remains negative for acute abnormality. EEG 8/2 with no seizure activity. Improved cognition 8/5, but persistent tachycardia/afib with RVR. 8/6 ischemic colitis. 8/7 shock with intubation and to OR for ex lap. 8/9 abdomen closure. 8/10 extubation. 8/21 respiratory distress with reintubation. 8/23 return to OR for ex lap and small bowel resection with ileostomy. 8/26 extubation after consult with Palliative medicine; no plans for reintubation. Pt has been followed for swallowing since 8/3. Diet before last intubation was dysphagia 3/thin liquids. PMHx:Afib, HTN, breast CA, melanoma, asthma, bilateral TKA, anxiety.      SLP Plan  Continue with current plan of care;MBS      Recommendations for follow up therapy are one component of a multi-disciplinary discharge planning process, led by the attending  physician.  Recommendations may be updated based on patient status, additional functional criteria and insurance authorization.    Recommendations  Diet recommendations: NPO                Oral Care Recommendations: Oral care QID Follow Up Recommendations: Other (comment) (tba) Assistance recommended at discharge: Frequent or constant Supervision/Assistance SLP Visit Diagnosis: Dysphagia, oropharyngeal phase (R13.12) Plan: Continue with current plan of care;MBS        [name] L. Tivis [name], MA CCC/SLP Clinical Specialist - Acute Care SLP Acute Rehabilitation Services Office number [phone]    [name]  01/31/2022, 1:51 PM

## 2022-02-01 ENCOUNTER — Inpatient Hospital Stay (HOSPITAL_COMMUNITY): Payer: Medicare Other

## 2022-02-01 DIAGNOSIS — A419 Sepsis, unspecified organism: Secondary | ICD-10-CM | POA: Diagnosis not present

## 2022-02-01 DIAGNOSIS — J90 Pleural effusion, not elsewhere classified: Secondary | ICD-10-CM | POA: Diagnosis not present

## 2022-02-01 DIAGNOSIS — J189 Pneumonia, unspecified organism: Secondary | ICD-10-CM | POA: Diagnosis not present

## 2022-02-01 DIAGNOSIS — I469 Cardiac arrest, cause unspecified: Secondary | ICD-10-CM | POA: Diagnosis not present

## 2022-02-01 DIAGNOSIS — J9601 Acute respiratory failure with hypoxia: Secondary | ICD-10-CM | POA: Diagnosis not present

## 2022-02-01 LAB — BODY FLUID CELL COUNT WITH DIFFERENTIAL
Eos, Fluid: 0 %
Lymphs, Fluid: 66 %
Monocyte-Macrophage-Serous Fluid: 8 % — ABNORMAL LOW (ref 50–90)
Neutrophil Count, Fluid: 25 % (ref 0–25)
Other Cells, Fluid: 1 %
Total Nucleated Cell Count, Fluid: 285 cu mm (ref 0–1000)

## 2022-02-01 LAB — CBC
HCT: 33.3 % — ABNORMAL LOW (ref 36.0–46.0)
Hemoglobin: 10 g/dL — ABNORMAL LOW (ref 12.0–15.0)
MCH: 31 pg (ref 26.0–34.0)
MCHC: 30 g/dL (ref 30.0–36.0)
MCV: 103.1 fL — ABNORMAL HIGH (ref 80.0–100.0)
Platelets: 255 10*3/uL (ref 150–400)
RBC: 3.23 MIL/uL — ABNORMAL LOW (ref 3.87–5.11)
RDW: 19.1 % — ABNORMAL HIGH (ref 11.5–15.5)
WBC: 9.6 10*3/uL (ref 4.0–10.5)
nRBC: 0.2 % (ref 0.0–0.2)

## 2022-02-01 LAB — GLUCOSE, CAPILLARY
Glucose-Capillary: 108 mg/dL — ABNORMAL HIGH (ref 70–99)
Glucose-Capillary: 111 mg/dL — ABNORMAL HIGH (ref 70–99)
Glucose-Capillary: 112 mg/dL — ABNORMAL HIGH (ref 70–99)
Glucose-Capillary: 116 mg/dL — ABNORMAL HIGH (ref 70–99)
Glucose-Capillary: 76 mg/dL (ref 70–99)

## 2022-02-01 LAB — COMPREHENSIVE METABOLIC PANEL
ALT: 19 U/L (ref 0–44)
AST: 19 U/L (ref 15–41)
Albumin: 2 g/dL — ABNORMAL LOW (ref 3.5–5.0)
Alkaline Phosphatase: 78 U/L (ref 38–126)
Anion gap: 5 (ref 5–15)
BUN: 22 mg/dL (ref 8–23)
CO2: 36 mmol/L — ABNORMAL HIGH (ref 22–32)
Calcium: 8.8 mg/dL — ABNORMAL LOW (ref 8.9–10.3)
Chloride: 105 mmol/L (ref 98–111)
Creatinine, Ser: 0.51 mg/dL (ref 0.44–1.00)
GFR, Estimated: >60 mL/min (ref >60)
Glucose, Bld: 118 mg/dL — ABNORMAL HIGH (ref 70–99)
Potassium: 4 mmol/L (ref 3.5–5.1)
Sodium: 146 mmol/L — ABNORMAL HIGH (ref 135–145)
Total Bilirubin: 0.8 mg/dL (ref 0.3–1.2)
Total Protein: 5.6 g/dL — ABNORMAL LOW (ref 6.5–8.1)

## 2022-02-01 LAB — PROTEIN, PLEURAL OR PERITONEAL FLUID: Total protein, fluid: <3 g/dL

## 2022-02-01 LAB — LACTATE DEHYDROGENASE, PLEURAL OR PERITONEAL FLUID: LD, Fluid: 65 U/L — ABNORMAL HIGH (ref 3–23)

## 2022-02-01 LAB — GLUCOSE, PLEURAL OR PERITONEAL FLUID: Glucose, Fluid: 127 mg/dL

## 2022-02-01 LAB — SURGICAL PATHOLOGY

## 2022-02-01 LAB — MAGNESIUM: Magnesium: 2 mg/dL (ref 1.7–2.4)

## 2022-02-01 LAB — PHOSPHORUS: Phosphorus: 3.8 mg/dL (ref 2.5–4.6)

## 2022-02-01 LAB — TRIGLYCERIDES: Triglycerides: 123 mg/dL (ref <150)

## 2022-02-01 LAB — HEPARIN LEVEL (UNFRACTIONATED): Heparin Unfractionated: 0.26 IU/mL — ABNORMAL LOW (ref 0.30–0.70)

## 2022-02-01 MED ORDER — INSULIN ASPART 100 UNIT/ML IJ SOLN: 0.0000 [IU] | INTRAMUSCULAR | Status: DC

## 2022-02-01 MED ORDER — TRACE MINERALS CU-MN-SE-ZN 300-55-60-3000 MCG/ML IV SOLN
INTRAVENOUS | Status: AC
  Filled 2022-02-01: qty 696.8

## 2022-02-01 MED ORDER — FUROSEMIDE 10 MG/ML IJ SOLN
40.0000 mg | Freq: Once | INTRAMUSCULAR | Status: AC
Start: 2022-02-01 — End: 2022-02-01
  Administered 2022-02-01: 40 mg via INTRAVENOUS
  Filled 2022-02-01: qty 4

## 2022-02-01 MED ORDER — RACEPINEPHRINE HCL 2.25 % IN NEBU
0.5000 mL | INHALATION_SOLUTION | Freq: Once | RESPIRATORY_TRACT | Status: AC
Start: 2022-02-01 — End: 2022-02-01
  Administered 2022-02-01: 0.5 mL via RESPIRATORY_TRACT
  Filled 2022-02-01: qty 0.5

## 2022-02-01 MED ORDER — FUROSEMIDE 10 MG/ML IJ SOLN
40.0000 mg | Freq: Once | INTRAMUSCULAR | Status: AC
  Administered 2022-02-01: 40 mg via INTRAVENOUS
  Filled 2022-02-01: qty 4

## 2022-02-01 MED ORDER — HEPARIN (PORCINE) 25000 UT/250ML-% IV SOLN
1100.0000 [IU]/h | INTRAVENOUS | Status: DC
  Administered 2022-02-01: 1000 [IU]/h via INTRAVENOUS
  Administered 2022-02-02: 1100 [IU]/h via INTRAVENOUS
  Filled 2022-02-01 (×2): qty 250

## 2022-02-01 NOTE — Procedures (Signed)
Thoracentesis  Procedure Note  [name]  [id number]  May 09, 1941  Date:02/01/22  Time:9:04 AM   Provider Performing:Malaiya Paczkowski V. Dontasia Miranda   Procedure: Thoracentesis with imaging guidance ([id number])  Indication(s) Pleural Effusion  Consent Risks of the procedure as well as the alternatives and risks of each were explained to the patient and/or caregiver.  Consent for the procedure was obtained and is signed in the bedside chart  Anesthesia Topical only with 1% lidocaine    Time Out Verified patient identification, verified procedure, site/side was marked, verified correct patient position, special equipment/implants available, medications/allergies/relevant history reviewed, required imaging and test results available.   Sterile Technique Maximal sterile technique including full sterile barrier drape, hand hygiene, sterile gown, sterile gloves, mask, hair covering, sterile ultrasound probe cover (if used).  Procedure Description Ultrasound was used to identify appropriate pleural anatomy for placement and overlying skin marked.  Area of drainage cleaned and draped in sterile fashion. Lidocaine was used to anesthetize the skin and subcutaneous tissue.  600 cc's of amber yellow appearing fluid was drained from the right pleural space. Catheter then removed and bandaid applied to site.   Complications/Tolerance None; patient tolerated the procedure well. Chest X-ray is ordered to confirm no post-procedural complication.   EBL Minimal   Specimen(s) Pleural fluid   [name] V. Elsworth Soho MD

## 2022-02-01 NOTE — Progress Notes (Signed)
RT placed patient on bipap per MD order with RN at bedside. Patient tolerating well at this time. RT will continue to monitor.

## 2022-02-01 NOTE — Progress Notes (Addendum)
[place] Progress Note Patient Name: MCKENNAH KRETCHMER DOB: 15-Nov-1940 MRN: [id number]   Date of Service  02/01/2022  HPI/Events of Note  Notified by RN and RT that patient has wheezing in the chest and upper airway wheeze as well.  Pt given neb treatment without improvement.    O2 requirements increased from 3L to 6L ventimask.  Pt is post thoracentesis today removing 600cc and also put put 2.3L after a dose of lasix.   BP 164/70. Crea 0.51.  eICU Interventions  Get stat CXR.  Will give a dose of nebulized racemic epinephrine.  Give an extra dose of Lasix '40mg'$  IV now.      Intervention Category Intermediate Interventions: Respiratory distress - evaluation and management  Elsie Lincoln 02/01/2022, 9:00 PM Insulin sliding scale changed to q4hrs.

## 2022-02-01 NOTE — Progress Notes (Signed)
PT Cancellation Note  Patient Details Name: [name] MRN: [id number] DOB: March 29, 1941   Cancelled Treatment:    Reason Eval/Treat Not Completed: Patient not medically ready (pt with respiratory distress requiring NRB and not appropriate at this time)   Trygg Mantz B Raylie Maddison 02/01/2022, 9:01 AM [place] Office: [phone]

## 2022-02-01 NOTE — Progress Notes (Signed)
ANTICOAGULATION CONSULT NOTE   Pharmacy Consult for Heparin Indication: atrial fibrillation  Allergies  Allergen Reactions   Chlorhexidine Gluconate Hives, Swelling and Rash       "ChloraPrep "   Doxycycline Nausea And Vomiting   Codeine Nausea Only and Other (See Comments)    Pt can tolerate when pre-medicated   Doxycycline Nausea And Vomiting   Nsaids     avoid due to gerd   Nsaids Other (See Comments)    Gi bleed   Tylenol [Acetaminophen]     Avoid due to GERD   Adhesive [Tape] Rash    Dermabond   Chlorhexidine Swelling and Rash   Codeine Nausea Only   Tylenol [Acetaminophen] Other (See Comments)    GI bleed    Patient Measurements: Height: 5' (152.4 cm) Weight: 78.3 kg (172 lb 9.9 oz) IBW/kg (Calculated) : 45.5 Heparin Dosing Weight: 60 kg   Vital Signs: Temp: 101.7 F (38.7 C) (08/28 1000) Temp Source: Bladder (08/28 0400) BP: 139/66 (08/28 1000) Pulse Rate: 124 (08/28 0900)  Labs: Recent Labs    01/30/22 0449 01/31/22 0411 02/01/22 0407  HGB 8.9* 10.0* 10.0*  HCT 27.9* 32.4* 33.3*  PLT 234 255 255  CREATININE 0.70 0.64 0.51     Estimated Creatinine Clearance: 53.6 mL/min (by C-G formula based on SCr of 0.51 mg/dL).   Medical History: Past Medical History:  Diagnosis Date   Anemia    history of   Anxiety    Arthritis    Asthma    Breast cancer ([place])    Cancer ([place])    skin cancers, one melanoma    Complication of anesthesia    patient woke up during colonoscopy   Concussion    8 yrs. 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    Keratosis, actinic    Melanoma ([facility])    Persistent atrial fibrillation ([facility]) 12/16/2016    CHA2DS2VASC score 3   Pneumonia    hx of     Medications:  Scheduled:   arformoterol  15 mcg Nebulization BID   bacitracin   Topical BID   budesonide (PULMICORT) nebulizer solution  0.25 mg Nebulization BID   insulin aspart  0-9 Units Subcutaneous Q8H   lidocaine  10 mL Other Once   metoprolol tartrate  2.5 mg Intravenous Q6H   mupirocin ointment   Nasal BID   pantoprazole (PROTONIX) IV  40 mg Intravenous Q24H   revefenacin  175 mcg Nebulization Daily    Assessment: 81 yof presenting with acute hypoxemia related to COPD excerbation/PNA complicated by PEA arrest  - on Xarelto PTA for Afib.  Pharmacy is consulted to resume heparin for atrial fibrillation given patient is stable s/p abdominal surgery with no further procedures planned. DOAC was briefly resumed inpatient and stopped 8/5, resumed 8/16, and stopped again on 8/20. Given stable CBC with no bleeding, pharmacy has been consulted to resume heparin. Heparin was previously therapeutic at 1200 units/hr.   Goal of Therapy:  Heparin level 0.3-0.7 units/ml aPTT 66-102 seconds Monitor platelets by anticoagulation protocol: Yes   Plan:  Will conservatively restart heparin at 1000 units/hr  without bolus given recent thoracentesis Heparin level in 8 hr Monitor CBC, and for s/sx of bleeding   Thank you for allowing pharmacy to participate in this patient's care.  Reatha Harps, PharmD PGY2 Pharmacy Resident 02/01/2022 10:26 AM Check [email or web address] for unit specific pharmacy number

## 2022-02-01 NOTE — Progress Notes (Signed)
NAME:  [name], MRN:  [id number], DOB:  10-25-1940, LOS: 39 ADMISSION DATE:  12/11/2021, CONSULTATION DATE:  01/09/2022 REFERRING MD:  Bonner Puna - [facility] CHIEF COMPLAINT:  Hypotension, abdominal pain   History of Present Illness:  81 year old woman who presented to [facility] 7/28 with CAP causing respiratory failure. Suffered cardiac arrest 7/30 and transferred to ICU, intubated. CT placed for parapneumonic effusion 7/31, removed 8/2 and extubated. Transferred to floor 8/5.   On 8/6, patient developed ischemic colitis (initially treated conservatively) however patient clinically declined and PCCM was reconsulted 8/7 for worsening shock, confusion. CCS consulted. Exploratory laparotomy performed by general surgery 8/7 for ischemic colitis with perforation s/p right colectomy and repeat laparotomy on 8/9 with intestinal anastomosis and abdominal closure. Patient was extubated 8/11 and transferred to the general medical floor.   CT A/P was completed 8/20 demonstrating R sided abdominal fluid collection with c/f anastomotic leak. Patient underwent IR CT-guided drain placement with feculent contents consistent with anastomotic leak/feculent peritonitis. Rapid response was called 8/21 in the setting of respiratory distress; patient was reintubated and transferred back to the ICU. Underwent OR takeback 8/23 with CCS with findings of anastomotic leak with dense adhesions requiring extensive LOA and SBR as well as end ileostomy creation.  PCCM following for vent/medical management.  Pertinent Medical History:  Breast cancer on letrozole Atrial fibrillation  Significant Hospital Events: Including procedures, antibiotic start and stop dates in addition to other pertinent events   7/28 admit, Ceftriaxone/Azithro 7/29 last dose xeralto  7/30 IHCA x 7 min, Ceftriaxone>Cefepime 7/31 Chest tube placed, heparin gtt started 8/1 patient remove chest tube.  Head CT obtained >negative.  Severely agitated overnight.   Vancomycin stopped.  Azithromycin stopped. 8/6 Transferred to [facility] [place] 8/7 Called CCM back for abd, Distention , hypotension and a fib with RVR, plan is for  urgent OR pending goals of care discussion then ICU post op if husband is in agreement. 8/10 Abdomen closed yesterday, SBT/SAT today 8/11 Extubated yesterday, doing well on [place], up OOB, feeling alright 8/13 Transferred to medical floor 8/14 Chest ultrasound shows atelectasis without significant right basilar effusion. 8/20 CT A/P with fuid collection in R mid-abdomen with c/f anastomotic leak. 8/21 Back to ICU for resp distress 8/22 CT-guided drain placement (RLQ) for fluid assessment, abscess vs. Anastomotic leak 8/23 Taken back to OR for ex-lap/concern for anastomotic leak; extensive LOA with findings of feculent peritonitis with +leak; enterotomies requiring additional SBR and end ileostomy. 8/24 POD#1 from [place]. Unable to wean vent this AM. Following simple commands on Precedex 0.5, Levo 8. No output from new ileostomy. 8/25 POD#2 from [place]. Agitated overnight. Weaning on PSV 12/5. Awake, following commands. Levo 64mg. Scant serous output in ostomy bag.  Made DNR after palliative meeting 8/26 Extubated. Right thoracentesis with removal of 600 cc  Interim History / Subjective:   Worsening breathing this morning. Given Versed for anxiety. Afebrile Excellent urine output 4.4 L with Lasix last 24 hours  Objective:  Blood pressure 122/81, pulse (!) 103, temperature 98.4 F (36.9 C), temperature source Bladder, resp. rate 15, height 5' (1.524 m), weight 78.3 kg, SpO2 98 %.        Intake/Output Summary (Last 24 hours) at 02/01/2022 0810 Last data filed at 02/01/2022 0600 Gross per 24 hour  Intake 1723.62 ml  Output 4525 ml  Net -2801.38 ml    Filed Weights   01/30/22 0500 01/31/22 0000 02/01/22 0500  Weight: 80 kg 78.4 kg 78.3 kg  Physical Examination: Gen:     chronically ill-appearing , mild  distress HEENT:  EOMI, sclera anicteric Neck:     No masses; no thyromegaly Lungs:    Scattered bilateral rhonchi, increased work of breathing CV:         Regular rate and rhythm; no murmurs Abd:      Abdominal wound inspected during VAC change, good granulation tissue, colostomy with good output Ext:    Diffuse anasarca; mild mottling both feet Skin:      Warm and dry; no rash Neuro: Awake, responsive, follows one-step commands  Labs/imaging reviewed  Stable hyponatremia, stable renal function, no leukocytosis, stable anemia Chest x-ray dependently reviewed shows bilateral effusions right more than left, cannot rule out right lower lobe atelectasis  Resolved Hospital Problem List:   Metabolic Acidosis Hypokalemia Hypomagnesemia  Assessment & Plan:  Septic Shock In setting of intrabdominal abscess and possible aspiration pneumonia with new dense RLL opacity.   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 8/22 CT-guided drain placement (RLQ) for fluid assessment, abscess vs. anastomotic leak. Taken back to OR 8/23 for ex-lap/concern for anastomotic leak; extensive LOA with findings of feculent peritonitis with +leak; enterotomies requiring additional SBR and end ileostomy.  Postoperative management per CCS Wound dressing to abdominal wounds Antibiotic coverage Surgery and wound care following.   Afib Resumed Lopressor IV on 8/26 Hold systemic AC in the setting of multiple  procedural interventions.   Restart at some point   Acute anemia, symptomatic  Multifactorial in the setting of surgery, critical illness, iatrogenic losses. Follow CBC, transfuse for hemoglobin less than 7  Severe protein calorie malnutrition  Diet advancement per CCS. Can consider placing cortrak tomorrow. Keep n.p.o. for now , too sick for proceeding with barium swallow Continue TPN  GOC Goals of care meeting on 8/25.  She is currently DNR.  Palliative care following. Husband was updated to return of events on 8/28 morning  Best Practice (right click and "Reselect all SmartList Selections" daily)   Diet/type: NPO DVT prophylaxis: SCD GI prophylaxis: PPI Lines: Central line Foley:  Yes, and it is still needed Code Status:  DNR Last date of multidisciplinary goals of care discussion [n/a].  Husband and son updated at bedside.  Critical care time:    The patient is critically ill with multiple organ system failure and requires high complexity decision making for assessment and support, frequent evaluation and titration of therapies, advanced monitoring, review of radiographic studies and interpretation of complex data.   Critical Care Time devoted to patient care services, exclusive of separately billable procedures, described in this note is 34 minutes.  Leanna Sato Elsworth Soho, MD [facility] 02/01/22 8:10 AM  Please see [email or web address] for pager details.  From 7A-7P if no response, please call [phone] After hours, please call ELink [phone]

## 2022-02-01 NOTE — Progress Notes (Signed)
[place] Progress Note Patient Name: [name] DOB: March 18, 1941 MRN: [id number]   Date of Service  02/01/2022  HPI/Events of Note  Pt experiencing wheezing/SOB, I have asked BSRN to give PRN Xopenex, pt has been really anxious, without sleep but BSRN is concerned about pt due to variety of complaints such as difficulty swallowing and breathing, asking for CXR and for you to speak to [facility].   On 2l/[place] with sats 88-93%  Note of LE mottling. No hemodynamic changes Labs reviewed, hypernatremia unchanged, H/H stable. Off anticoagulation.  eICU Interventions  Ordered Chest xray, s/p thoracentesis 2 days ago. Also extubated on the same day Discussed with bedside RN to go ahead and give a dose of the Fentanyl ordered to relieve her of her dyspnea while awaiting on cxr Ongoing goals of care discussion     Intervention Category Intermediate Interventions: Respiratory distress - evaluation and management  Judd Lien 02/01/2022, 6:25 AM

## 2022-02-01 NOTE — Consult Note (Addendum)
[place] Nurse wound follow up Patient receiving care in [place], PA-C present for dressing removal.  Wound type: surgical very close in proximity to the ostomy pouch. Pressure Injury POA: NA Measurement: 15 cm x 2.3 cm x 2 cm  Wound bed: See photo taken today by K. Osborne Drainage (amount, consistency, odor) serosanguinous in canister Periwound: intact Dressing procedure/placement/frequency: One piece of black foam removed. One piece of black foam placed in the wound. Drape applied, immediate suction obtained at 125 mmHg.    Supplies at bedside. 1 large black foam dressing kits and 1 canister. Ileostomy pouch intact, just starting to have some thin dark green/black output. No family present. Patient is still not teachable at this point. Will continue to monitor and change pouch as needed.     WOC will follow MWF   Jocelyn Lamer L. Tamala Julian, MSN, RN, [place], Lysle Pearl, [place] [facility] Associate Pager [phone]

## 2022-02-01 NOTE — Progress Notes (Signed)
Palliative Medicine Progress Note   Patient Name: [name]       Date: 02/01/2022 DOB: 04/13/1941  Age: 81 y.o. MRN#: [id number] Attending Physician: Marshell Garfinkel, MD Primary Care Physician: [facility] [place] Admit Date: 12/05/2021  Reason for Consultation/Follow-up: {Reason for Consult:23484}  HPI/Patient Profile: 81 y.o. female  with past medical history of breast cancer, atrial fibrillation, and anemia who was admitted admitted on 12/20/2021 with community-acquired pneumonia and acute respiratory failure.  On 7/30 she went into cardiac arrest and ROSC was achieved after 7 minutes.  Chest tube was placed for parapneumonic effusion, and she was extubated on 8/2.  Sent out of ICU on 8/5.  She underwent exploratory laparotomy on 8/7 for ischemic colitis with perforation and is s/p right colectomy.  She underwent repeat laparotomy on 8/9 with intestinal anastomosis and abdominal closure.  She was extubated 8/10 and transferred to the floor.  CT abdomen 8/20 showed right-sided abdominal fluid collection in which IR was consulted for drainage.  Rapid response was called 8/21 for respiratory distress in which she was intubated and transferred back to ICU. Palliative medicine was consulted for goals of care in the setting of prolonged hospital stay, multiple complications, and complex medical history.  Subjective: ***  Objective:  Physical Exam          Vital Signs: BP (!) 115/91   Pulse (!) 115   Temp (!) 102.2 F (39 C) (Bladder)   Resp (!) 27   Ht 5' (1.524 m)   Wt 78.3 kg   SpO2 100%   BMI 33.71 kg/m  SpO2: SpO2: 100 % O2 Device: O2 Device: Nasal Cannula O2 Flow Rate: O2 Flow Rate (L/min): 5 L/min  Intake/output summary:  Intake/Output Summary (Last 24  hours) at 02/01/2022 1539 Last data filed at 02/01/2022 1000 Gross per 24 hour  Intake 1650.36 ml  Output 2525 ml  Net -874.64 ml    LBM: Last BM Date : 01/26/22     Palliative Assessment/Data: ***     Palliative Medicine Assessment & Plan   Assessment: Principal Problem:   CAP (community acquired pneumonia) Active Problems:   Hypertensive heart disease without CHF   Hypotension   A-fib (HCC)   Lactic acid increased   Cardiac arrest ([place])   Acute respiratory failure with hypoxia (HCC)  Pneumonia of right lower lobe due to infectious organism   Takotsubo cardiomyopathy   Ischemic colitis ([place])   Septic shock ([place])   Pressure injury of skin    Recommendations/Plan: ***  Goals of Care and Additional Recommendations: Limitations on Scope of Treatment: {Recommended Scope and Preferences:21019}  Code Status:   Prognosis:  {Palliative Care Prognosis:23504}  Discharge Planning: {Palliative dispostion:23505}  Care plan was discussed with ***  Thank you for allowing the Palliative Medicine Team to assist in the care of this patient.   ***   Lavena Bullion, NP   Please contact Palliative Medicine Team phone at [phone] for questions and concerns.  For individual providers, please see AMION.

## 2022-02-01 NOTE — Progress Notes (Signed)
OT Cancellation Note  Patient Details Name: [name] MRN: [id number] DOB: [birth year]   Cancelled Treatment:    Reason Eval/Treat Not Completed: Patient not medically ready. (pt with respiratory distress requiring NRB and not appropriate at this time)  [place], OTR/L [facility] [phone] Office [phone]  '  Almon Register 02/01/2022, 9:20 AM

## 2022-02-01 NOTE — Progress Notes (Addendum)
ANTICOAGULATION CONSULT NOTE  Pharmacy Consult for Heparin Indication: atrial fibrillation  Allergies  Allergen Reactions   Chlorhexidine Gluconate Hives, Swelling and Rash       "ChloraPrep "   Doxycycline Nausea And Vomiting   Codeine Nausea Only and Other (See Comments)    Pt can tolerate when pre-medicated   Doxycycline Nausea And Vomiting   Nsaids     avoid due to gerd   Nsaids Other (See Comments)    Gi bleed   Tylenol [Acetaminophen]     Avoid due to GERD   Adhesive [Tape] Rash    Dermabond   Chlorhexidine Swelling and Rash   Codeine Nausea Only   Tylenol [Acetaminophen] Other (See Comments)    GI bleed    Patient Measurements: Height: 5' (152.4 cm) Weight: 78.3 kg (172 lb 9.9 oz) IBW/kg (Calculated) : 45.5 Heparin Dosing Weight: 60 kg   Vital Signs: Temp: 99 F (37.2 C) (08/28 1800) Temp Source: Bladder (08/28 1800) BP: 147/80 (08/28 1900) Pulse Rate: 115 (08/28 1900)  Labs: Recent Labs    01/30/22 0449 01/31/22 0411 02/01/22 0407 02/01/22 1823  HGB 8.9* 10.0* 10.0*  --   HCT 27.9* 32.4* 33.3*  --   PLT 234 255 255  --   HEPARINUNFRC  --   --   --  0.26*  CREATININE 0.70 0.64 0.51  --      Estimated Creatinine Clearance: 53.6 mL/min (by C-G formula based on SCr of 0.51 mg/dL).   Assessment: 62 yof presenting with acute hypoxemia related to COPD excerbation/PNA complicated by PEA arrest  - on Xarelto PTA for Afib.  Pharmacy is consulted to resume heparin for atrial fibrillation given patient is stable s/p abdominal surgery with no further procedures planned. DOAC was briefly resumed inpatient and stopped 8/5, resumed 8/16, and stopped again on 8/20. Given stable CBC with no bleeding, pharmacy has been consulted to resume heparin.  Previously therapeutic on 1200 units/hr.  Heparin level slightly low at 0.26 units/mL.  No issue with infusion nor bleeding.  Goal of Therapy:  Heparin level 0.3-0.7 units/ml aPTT 66-102 seconds Monitor platelets  by anticoagulation protocol: Yes   Plan:  Increase heparin infusion to 1100 units/hr - no bolus given recent thoracentesis F/U AM labs  Hermie Reagor D. Mina Marble, PharmD, BCPS, [place] 02/01/2022, 7:47 PM

## 2022-02-01 NOTE — Progress Notes (Signed)
[facility] Progress Note  5 Days Post-Op  Subjective: Denies SOB, but is certainly using accessory muscles to breath.  On O2 4L [place].  Ostomy working well.  No nausea  Objective: Vital signs in last 24 hours: Temp:  [98.4 F (36.9 C)-99.1 F (37.3 C)] 98.4 F (36.9 C) (08/28 0400) Pulse Rate:  [82-120] 103 (08/28 0706) Resp:  [15-37] 15 (08/28 0706) BP: (121-161)/(65-108) 122/81 (08/28 0706) SpO2:  [85 %-100 %] 98 % (08/28 0706) Weight:  [78.3 kg] 78.3 kg (08/28 0500) Last BM Date : 01/26/22  Intake/Output from previous day: 08/27 0701 - 08/28 0700 In: 1888 [I.V.:1639.8; IV Piggyback:248.2] Out: 6213 [YQMVH:8469; Drains:75; Stool:25] Intake/Output this shift: No intake/output data recorded.  PE: Gen:  Alert Abd: Soft, appropriately tender, ostomy viable with thin bilious fluid in bag  Msk: 2+ edema BUE/BLE  Lab Results:  Recent Labs    01/31/22 0411 02/01/22 0407  WBC 9.4 9.6  HGB 10.0* 10.0*  HCT 32.4* 33.3*  PLT 255 255   BMET Recent Labs    01/31/22 0411 02/01/22 0407  NA 146* 146*  K 4.3 4.0  CL 110 105  CO2 32 36*  GLUCOSE 106* 118*  BUN 22 22  CREATININE 0.64 0.51  CALCIUM 9.2 8.8*   PT/INR No results for input(s): "LABPROT", "INR" in the last 72 hours. CMP     Component Value Date/Time   NA 146 (H) 02/01/2022 0407   NA 139 11/18/2021 1432   NA 134 (L) 04/25/2017 1216   K 4.0 02/01/2022 0407   K 4.3 04/25/2017 1216   CL 105 02/01/2022 0407   CO2 36 (H) 02/01/2022 0407   CO2 29 04/25/2017 1216   GLUCOSE 118 (H) 02/01/2022 0407   GLUCOSE 85 04/25/2017 1216   BUN 22 02/01/2022 0407   BUN 10 11/18/2021 1432   BUN 14.4 04/25/2017 1216   CREATININE 0.51 02/01/2022 0407   CREATININE 0.72 01/17/2018 1347   CREATININE 0.7 04/25/2017 1216   CALCIUM 8.8 (L) 02/01/2022 0407   CALCIUM 10.0 04/25/2017 1216   PROT 5.6 (L) 02/01/2022 0407   PROT 7.2 04/25/2017 1216   ALBUMIN 2.0 (L) 02/01/2022 0407   ALBUMIN 3.7 04/25/2017 1216    AST 19 02/01/2022 0407   AST 20 01/17/2018 1347   AST 21 04/25/2017 1216   ALT 19 02/01/2022 0407   ALT 10 01/17/2018 1347   ALT 10 04/25/2017 1216   ALKPHOS 78 02/01/2022 0407   ALKPHOS 75 04/25/2017 1216   BILITOT 0.8 02/01/2022 0407   BILITOT 0.5 01/17/2018 1347   BILITOT 0.65 04/25/2017 1216   GFRNONAA >60 02/01/2022 0407   GFRNONAA >60 01/17/2018 1347   GFRAA 90 04/24/2020 1235   GFRAA >60 01/17/2018 1347   Lipase     Component Value Date/Time   LIPASE 77 07/28/2008 1630       Studies/Results: DG CHEST PORT 1 VIEW  Result Date: 02/01/2022 CLINICAL DATA:  Respiratory distress. EXAM: PORTABLE CHEST 1 VIEW COMPARISON:  01/31/2022. FINDINGS: Left arm PICC line tip is in the cavoatrial junction. Stable cardiomediastinal contours. Moderate to large right pleural effusion and small left pleural effusion unchanged from previous exam. There is significantly diminished aeration to the right lower lung and right midlung which appears unchanged from the previous study. IMPRESSION: 1. No change in aeration to the right lower lung and right midlung compared with previous exam. 2. Stable bilateral pleural effusions, right greater than left. Electronically Signed   By: Queen Slough.D.  On: 02/01/2022 06:54   DG CHEST PORT 1 VIEW  Result Date: 01/31/2022 CLINICAL DATA:  Acute respiratory failure EXAM: PORTABLE CHEST 1 VIEW COMPARISON:  Chest x-ray dated January 31, 2022 FINDINGS: Partially visualized cardiac and mediastinal contours are unchanged. Bibasilar atelectasis and moderate right and small left pleural effusions, unchanged. No evidence of pneumothorax. IMPRESSION: Bibasilar atelectasis and moderate right and small left pleural effusions, unchanged. Electronically Signed   By: Yetta Glassman M.D.   On: 01/31/2022 16:05   DG Chest Port 1 View  Result Date: 01/31/2022 CLINICAL DATA:  Acute respiratory failure EXAM: PORTABLE CHEST 1 VIEW COMPARISON:  Prior chest x-ray yesterday  01/30/2022 FINDINGS: The patient has been extubated, the right central line removed and the nasogastric tube removed. The left upper extremity PICC remains in stable position with the tip overlying the right atrium. Stable cardiac and mediastinal contours. Inspiratory volumes are very low. Increased streaky airspace opacities in the bases bilaterally consistent with atelectasis. Probable moderate right pleural effusion and small left pleural effusion. IMPRESSION: 1. Interval extubation, removal of gastric tube and right IJ central line. 2. Lower inspiratory volumes with increased bibasilar atelectasis. 3. Moderate right and small left pleural effusions. Electronically Signed   By: Jacqulynn Cadet M.D.   On: 01/31/2022 08:14   DG CHEST PORT 1 VIEW  Result Date: 01/30/2022 CLINICAL DATA:  [id number] status post thoracocentesis EXAM: PORTABLE CHEST 1 VIEW COMPARISON:  January 30, 2022 FINDINGS: The cardiomediastinal silhouette is unchanged in contour.ETT tip terminates 18 mm above the carina. Enteric tube tip and side port project over the proximal stomach. RIGHT neck CVC tip terminates over the expected region of superior cavoatrial junction. LEFT upper extremity PICC tip terminates over the expected region of the superior RIGHT atrium. Moderate RIGHT pleural effusion, decreased in comparison to prior. Small LEFT pleural effusion. No significant pneumothorax. Bilateral basilar predominant airspace opacities with mildly improved aeration of the RIGHT mid lung in comparison to prior. Visualized abdomen is unremarkable. IMPRESSION: Moderate RIGHT pleural effusion, decreased in comparison to prior. No significant pneumothorax is noted. Electronically Signed   By: Valentino Saxon M.D.   On: 01/30/2022 10:18    Anti-infectives: Anti-infectives (From admission, onward)    Start     Dose/Rate Route Frequency Ordered Stop   01/26/22 1400  piperacillin-tazobactam (ZOSYN) IVPB 3.375 g        3.375 g 12.5 mL/hr over  240 Minutes Intravenous Every 8 hours 01/26/22 0945 02/01/22 2359   01/24/2022 1030  vancomycin (VANCOREADY) IVPB 750 mg/150 mL  Status:  Discontinued        750 mg 150 mL/hr over 60 Minutes Intravenous Every 24 hours 01/26/2022 0937 01/28/22 1520   01/14/2022 1000  vancomycin (VANCOREADY) IVPB 1500 mg/300 mL  Status:  Discontinued        1,500 mg 150 mL/hr over 120 Minutes Intravenous Every 48 hours 01/23/22 0909 01/26/2022 0937   01/23/22 1030  vancomycin (VANCOREADY) IVPB 1250 mg/250 mL        1,250 mg 166.7 mL/hr over 90 Minutes Intravenous  Once 01/23/22 0907 01/23/22 1259   01/23/22 1000  metroNIDAZOLE (FLAGYL) IVPB 500 mg  Status:  Discontinued        500 mg 100 mL/hr over 60 Minutes Intravenous Every 12 hours 01/23/22 0839 01/26/22 0945   01/23/22 1000  ceFEPIme (MAXIPIME) 2 g in sodium chloride 0.9 % 100 mL IVPB  Status:  Discontinued        2 g 200 mL/hr over 30 Minutes Intravenous  Every 12 hours 01/23/22 0901 01/26/22 0945   01/26/2022 1400  micafungin (MYCAMINE) 100 mg in sodium chloride 0.9 % 100 mL IVPB  Status:  Discontinued        100 mg 105 mL/hr over 1 Hours Intravenous Every 24 hours 01/28/2022 1302 01/15/22 1211   01/10/22 1515  piperacillin-tazobactam (ZOSYN) IVPB 3.375 g        3.375 g 12.5 mL/hr over 240 Minutes Intravenous Every 8 hours 01/10/22 1427 01/21/2022 2359   01/04/22 2000  vancomycin (VANCOCIN) IVPB 1000 mg/200 mL premix  Status:  Discontinued        1,000 mg 200 mL/hr over 60 Minutes Intravenous Every 24 hours 01/03/22 1910 01/05/22 1321   01/03/22 2200  ceFEPIme (MAXIPIME) 2 g in sodium chloride 0.9 % 100 mL IVPB        2 g 200 mL/hr over 30 Minutes Intravenous Every 12 hours 01/03/22 1910 01/08/22 2209   01/03/22 1930  vancomycin (VANCOREADY) IVPB 1250 mg/250 mL        1,250 mg 166.7 mL/hr over 90 Minutes Intravenous  Once 01/03/22 1910 01/03/22 2219   01/02/22 2100  azithromycin (ZITHROMAX) 500 mg in sodium chloride 0.9 % 250 mL IVPB        500 mg 250 mL/hr   Intravenous Every 24 hours 12/12/2021 2248 01/06/22 2149   01/02/22 2000  cefTRIAXone (ROCEPHIN) 2 g in sodium chloride 0.9 % 100 mL IVPB  Status:  Discontinued        2 g 200 mL/hr over 30 Minutes Intravenous Every 24 hours 12/17/2021 2248 01/03/22 1733   12/27/2021 2030  cefTRIAXone (ROCEPHIN) 1 g in sodium chloride 0.9 % 100 mL IVPB        1 g 200 mL/hr over 30 Minutes Intravenous  Once 12/13/2021 2025 12/17/2021 2141   12/12/2021 2030  azithromycin (ZITHROMAX) 500 mg in sodium chloride 0.9 % 250 mL IVPB        500 mg 250 mL/hr over 60 Minutes Intravenous  Once 12/25/2021 2025 12/29/2021 2312        Assessment/Plan POD #21/19, S/p exploratory laparotomy,  R colectomy, abthera placement 01/05/2022 Dr. Rosendo Gros S/p exploratory laparotomy, intestinal anastomosis, abdominal closure 02/04/2022 Dr. 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 Redmond Pulling - Ostomy with some thin bilious output. Severe aspiration risk per SLP, only on ice chips. Continue TPN for now.  -MBS when appropriate from clinical status standpoint - WOC consult for new ileostomy and VAC - Vac MWF - continue zosyn x5 days postop (end date 8/28)   FEN: NPO except ice chips, TNA  ID: Zosyn (stop date 8/14), cefepime/flagyl 8/19 >>8/21, Vanc/zosyn 8/21 --> VTE: SCD's, hold DOAC/ ok for heparin gtt if needed no bolus Foley: in place for strict I&Os Dispo: ICU   Below per TRH/CCM -- Septic shock due to above Acute hypoxic respiratory failure - per CCM, extubated on 8/26, but tenuous Cardiac arrest 7/30, 7 mins CPR Hx cardiomyopathy COPD Afib, permanent CAP DNR    LOS: 74 days    Henreitta Cea, [facility] 02/01/2022, 8:07 AM Please see Amion for pager number during day hours 7:00am-4:30pm

## 2022-02-01 NOTE — Progress Notes (Signed)
Nutrition Follow-up  DOCUMENTATION CODES:   Not applicable  INTERVENTION:  ***   NUTRITION DIAGNOSIS:   Inadequate oral intake related to acute illness as evidenced by NPO status.  ***  GOAL:   Patient will meet greater than or equal to 90% of their needs  ***  MONITOR:   Vent status, Labs, TF tolerance, Weight trends, Skin  REASON FOR ASSESSMENT:   Consult, Ventilator Enteral/tube feeding initiation and management  ASSESSMENT:   81 yo female admitted with acute respiratory failure related to COPD exacerbation and pneumonia, went into PEA arrest and required intubation. PMH includes A.fib, breast cancer, HTN, GERD, asthma, melanoma  7/28 Admit 7/30 IHCA x 7 minutes, Intubated 7/31 Chest tube placed, TF initiatede 8/01 Pt pulled out chest tube, CT head negative 8/02 Extubated 8/04 Cortrak placed, Cortrak pulled out by pt and replaced again, TF started 8/06 Abd pain/Abd fullness; KUB suggestive of ileus, TF on hold, Surgery consulted, CT abd with ischemic colitis, NG tube to LIS, Cortrak removed 8/07 Febrile, worsening abd pain, more somnolent, OR for ex lap and colon resection due to ischemic colon with contained perf, feculent peritonitis, abthera wound VAC placed, intubated 8/08 TPN consulted placed 8/09 Return to OR for ex lap, intestinal anastomosis, abd closure, TPN initiated 8/10 Extubated, TPN increased to goal rate 8/15 TPN weaned off  8/21 CT with right sided fluid collection, air-?anastomotic leak vs abscess vs post op change, IR consulted for drainage, Intubated 8/22 CT drain for RLQ abscess placed by IR, draining fecal contaminated fluid 8/23 OR: Ex Lap, extensive LOA, resection distal SB and ileocolonic anastomosis, creation of end ileostomy 8/26 Extubated, Thoracentesis- 600 mL amber yellow  Pt with worsening breathing, using accessory muscles to breath.   NPO, not stable to participate in swallow evaluation  TPN at current goal rate of 65 ml/hr  providing 104 g of protein and 1821 kcals  +ostomy function with thin bilious output  WOC RN consulted for new ileostomy and wound VAC to midline  Labs: sodium 146, BUN wdl, Creatinine wdl, TG wdl,  Meds:   NUTRITION - FOCUSED PHYSICAL EXAM:  {RD Focused Exam List:21252}  Diet Order:   Diet Order             Diet NPO time specified  Diet effective now                   EDUCATION NEEDS:   Not appropriate for education at this time  Skin:  Skin Assessment: Skin Integrity Issues: Skin Integrity Issues:: Other (Comment) Wound Vac: midline abd wound-dehisced Incisions: new ilesostomy Other: partial thickness wound to nares of nose  Last BM:  new ileostomy with thin bilious output  Height:   Ht Readings from Last 1 Encounters:  02/01/22 5' (1.524 m)    Weight:   Wt Readings from Last 1 Encounters:  02/01/22 78.3 kg    BMI:  Body mass index is 33.71 kg/m.  Estimated Nutritional Needs:   Kcal:  1700-1900 kcals  Protein:  95-115 g  Fluid:  >/= 1.5 L    Kerman Passey MS, RDN, LDN, CNSC Registered Dietitian 3 Clinical Nutrition RD Pager and On-Call Pager Number Located in [place]

## 2022-02-01 NOTE — Progress Notes (Signed)
SLP Cancellation Note  Patient Details Name: [name] MRN: [id number] DOB: 28-Jul-1940   Cancelled treatment:   Pt with  worsening breathing this am. Will hold on FEES.  [name] [name], MA CCC/SLP Clinical Specialist - Acute Care SLP Acute Rehabilitation Services Office number [phone]       [name] 02/01/2022, 8:48 AM

## 2022-02-01 NOTE — Progress Notes (Signed)
PHARMACY - TOTAL PARENTERAL NUTRITION CONSULT NOTE  Indication: Intolerance to enteral feeding / anastomotic leak  Patient Measurements: Height: 5' (152.4 cm) Weight: 78.3 kg (172 lb 9.9 oz) IBW/kg (Calculated) : 45.5 TPN AdjBW (KG): 53.7 Body mass index is 33.71 kg/m.  Assessment:  [name] admited for CAP causing respiratory failure, had a cardiac arrest after admission and was moved to the ICU.  Chest tube placed for parapneumonic effusion, extubated on 8/2. Sent out of ICU on 8/5. On 8/6 she developed ischemic colitis, initially treated conservatively but transferred back to ICU for worsening shock and confusion 8/7.  Now s/p ex lap, R colectomy for ischemic bowel with perforation. Pt was provided TPN from 8/9-8/15. Pt's diet was advanced and pt ate 25-50% of meals from 8/15-8/20. Diet was changed to CLD on 8/20 for IR placement of drain for anastomotic leak. However, drain was not placed because pt acutely decompensated and was intubated and admitted to ICU and made NPO on 8/21. IR placed drain on 8/22 and drainage was positive for enteric contents confirming anastomotic leak. Surgery plans to perform resection of anastomosis and possible diversion with ileostomy on 8/23. Pharmacy consulted to manage TPN.  Glucose / Insulin: A1c 4.9% - CBGs < 180.  Required 0 unit of SSI  Electrolytes: high-stable Na 146 (was hypoNa earlier in admission), K WNL at 4 (goal >=4), Mag 2 (goal >/= 2 for ileus and Afib), CoCa down to 10.4 after removal from TPN (Ca x Phos = 39, goal < 55), others WNL  Renal: SCr < 1, BUN WNL  Hepatic: LFTs / tbili / TG WNL, albumin 2 Intake / Output; MIVF: UOP 2.4 ml/kg/hr, NG 17m, drain removed, stool 25 mL, LR 277mhr, net +3.3 L GI Imaging: 8/6 KUB: Mild gaseous distension favors ileus  8/6 CT: concerning for iscehmic colitis; possible SBO and/or inflammatory ileus  8/7 KUB: diffuse gaseous small bowel and colonic distension 8/20 CT: extraluminal collection of gas and fluid  possibly representing an anastomotic leak, postop fluid collection, or abscess GI Surgeries / Procedures:  8/7 Ex lap, colon resection 8/9 Ex lap, intestinal anastomosis, wound closure 8/22 CT-guided anterior RLQ abscess drain placement 8/23 Ex lap with resection of distal small bowel and ileocolonic anastomosis, creation of end ileostomy, and lysis of adhesions   Central access: PICC placed 01/12/22 TPN start date:  01/08/2022-01/19/22, restarted 8/23  Nutritional Goals: RD Estimated Needs Total Energy Estimated Needs: 1700-1900 kcals Total Protein Estimated Needs: 95-115 g Total Fluid Estimated Needs: >/= 1.5 L  Current Nutrition:  NPO and TPN   Plan:  Continue TPN at goal rate of 65 ml/hr to provide 104g AA, 234g CHO, 61g lipids, and 1821 kCal, meeting 100% of nutrition needs Electrolytes in TPN: continue Na 68m83mL, K increased to 668m1068m on 8/26 (= 94mE15my), Ca removed 8/26, max Mg at 15mEq66m= ~3g/day), decreased Phos to 22mmol29mn 8/26, max CL for now  Add standard MVI and trace elements to TPN  D/C SSI/CBG checks Standard TPN labs on Mon/Thurs  F/u swallow eval and ability to start an oral diet vs placing cortrak for TF  JessicaSloan LeiterD, BCPS, BCCCP Clinical Pharmacist Please refer to [facility] 02/01/2022, 7:35 AM

## 2022-02-02 ENCOUNTER — Telehealth (HOSPITAL_COMMUNITY): Payer: Self-pay | Admitting: Pharmacy Technician

## 2022-02-02 ENCOUNTER — Other Ambulatory Visit (HOSPITAL_COMMUNITY): Payer: Self-pay

## 2022-02-02 DIAGNOSIS — K559 Vascular disorder of intestine, unspecified: Secondary | ICD-10-CM | POA: Diagnosis not present

## 2022-02-02 DIAGNOSIS — I5181 Takotsubo syndrome: Secondary | ICD-10-CM | POA: Diagnosis not present

## 2022-02-02 DIAGNOSIS — J9601 Acute respiratory failure with hypoxia: Secondary | ICD-10-CM | POA: Diagnosis not present

## 2022-02-02 DIAGNOSIS — I119 Hypertensive heart disease without heart failure: Secondary | ICD-10-CM | POA: Diagnosis not present

## 2022-02-02 LAB — BASIC METABOLIC PANEL
Anion gap: 5 (ref 5–15)
Anion gap: 8 (ref 5–15)
BUN: 25 mg/dL — ABNORMAL HIGH (ref 8–23)
BUN: 25 mg/dL — ABNORMAL HIGH (ref 8–23)
CO2: 38 mmol/L — ABNORMAL HIGH (ref 22–32)
CO2: 36 mmol/L — ABNORMAL HIGH (ref 22–32)
Calcium: 8.5 mg/dL — ABNORMAL LOW (ref 8.9–10.3)
Calcium: 8.9 mg/dL (ref 8.9–10.3)
Chloride: 97 mmol/L — ABNORMAL LOW (ref 98–111)
Chloride: 99 mmol/L (ref 98–111)
Creatinine, Ser: 0.54 mg/dL (ref 0.44–1.00)
Creatinine, Ser: 0.58 mg/dL (ref 0.44–1.00)
GFR, Estimated: >60 mL/min (ref >60)
GFR, Estimated: >60 mL/min (ref >60)
Glucose, Bld: 499 mg/dL — ABNORMAL HIGH (ref 70–99)
Glucose, Bld: 111 mg/dL — ABNORMAL HIGH (ref 70–99)
Potassium: 6.1 mmol/L — ABNORMAL HIGH (ref 3.5–5.1)
Potassium: 4.1 mmol/L (ref 3.5–5.1)
Sodium: 140 mmol/L (ref 135–145)
Sodium: 143 mmol/L (ref 135–145)

## 2022-02-02 LAB — MAGNESIUM: Magnesium: 2.5 mg/dL — ABNORMAL HIGH (ref 1.7–2.4)

## 2022-02-02 LAB — GLUCOSE, CAPILLARY
Glucose-Capillary: 113 mg/dL — ABNORMAL HIGH (ref 70–99)
Glucose-Capillary: 98 mg/dL (ref 70–99)
Glucose-Capillary: 101 mg/dL — ABNORMAL HIGH (ref 70–99)
Glucose-Capillary: 100 mg/dL — ABNORMAL HIGH (ref 70–99)
Glucose-Capillary: 106 mg/dL — ABNORMAL HIGH (ref 70–99)
Glucose-Capillary: 110 mg/dL — ABNORMAL HIGH (ref 70–99)

## 2022-02-02 LAB — CBC
HCT: 31.6 % — ABNORMAL LOW (ref 36.0–46.0)
Hemoglobin: 9.7 g/dL — ABNORMAL LOW (ref 12.0–15.0)
MCH: 30.6 pg (ref 26.0–34.0)
MCHC: 30.7 g/dL (ref 30.0–36.0)
MCV: 99.7 fL (ref 80.0–100.0)
Platelets: 233 10*3/uL (ref 150–400)
RBC: 3.17 MIL/uL — ABNORMAL LOW (ref 3.87–5.11)
RDW: 18.7 % — ABNORMAL HIGH (ref 11.5–15.5)
WBC: 10.2 10*3/uL (ref 4.0–10.5)
nRBC: 0.2 % (ref 0.0–0.2)

## 2022-02-02 LAB — HEPARIN LEVEL (UNFRACTIONATED): Heparin Unfractionated: 0.5 IU/mL (ref 0.30–0.70)

## 2022-02-02 MED ORDER — FUROSEMIDE 10 MG/ML IJ SOLN
40.0000 mg | Freq: Two times a day (BID) | INTRAMUSCULAR | Status: DC
Start: 2022-02-02 — End: 2022-02-06
  Administered 2022-02-02 – 2022-02-06 (×9): 40 mg via INTRAVENOUS
  Filled 2022-02-02 (×9): qty 4

## 2022-02-02 MED ORDER — METOPROLOL TARTRATE 5 MG/5ML IV SOLN
5.0000 mg | Freq: Four times a day (QID) | INTRAVENOUS | Status: DC
  Administered 2022-02-02 – 2022-02-03 (×4): 5 mg via INTRAVENOUS
  Filled 2022-02-02 (×4): qty 5

## 2022-02-02 MED ORDER — TRACE MINERALS CU-MN-SE-ZN 300-55-60-3000 MCG/ML IV SOLN
INTRAVENOUS | Status: AC
  Filled 2022-02-02: qty 696.8

## 2022-02-02 MED ORDER — FUROSEMIDE 10 MG/ML IJ SOLN: 40.0000 mg | Freq: Every day | INTRAMUSCULAR | Status: DC

## 2022-02-02 MED ORDER — BOOST / RESOURCE BREEZE PO LIQD CUSTOM
1.0000 | Freq: Three times a day (TID) | ORAL | Status: DC
  Administered 2022-02-03 – 2022-02-10 (×11): 1 via ORAL

## 2022-02-02 MED FILL — Medication: Qty: 1 | Status: AC

## 2022-02-02 NOTE — Progress Notes (Signed)
ANTICOAGULATION CONSULT NOTE   Pharmacy Consult for Heparin Indication: atrial fibrillation  Allergies  Allergen Reactions   Chlorhexidine Gluconate Hives, Swelling and Rash       "ChloraPrep "   Doxycycline Nausea And Vomiting   Codeine Nausea Only and Other (See Comments)    Pt can tolerate when pre-medicated   Doxycycline Nausea And Vomiting   Nsaids     avoid due to gerd   Nsaids Other (See Comments)    Gi bleed   Tylenol [Acetaminophen]     Avoid due to GERD   Adhesive [Tape] Rash    Dermabond   Chlorhexidine Swelling and Rash   Codeine Nausea Only   Tylenol [Acetaminophen] Other (See Comments)    GI bleed    Patient Measurements: Height: 5' (152.4 cm) Weight: 74.1 kg (163 lb 5.8 oz) IBW/kg (Calculated) : 45.5 Heparin Dosing Weight: 60 kg   Vital Signs: Temp: 98.4 F (36.9 C) (08/29 0753) Temp Source: Oral (08/29 0753) BP: 150/83 (08/29 0700) Pulse Rate: 105 (08/29 0700)  Labs: Recent Labs    01/31/22 0411 02/01/22 0407 02/01/22 1823 02/02/22 0615  HGB 10.0* 10.0*  --  9.7*  HCT 32.4* 33.3*  --  31.6*  PLT 255 255  --  233  HEPARINUNFRC  --   --  0.26* 0.50  CREATININE 0.64 0.51  --   --      Estimated Creatinine Clearance: 52.1 mL/min (by C-G formula based on SCr of 0.51 mg/dL).   Medical History: Past Medical History:  Diagnosis Date   Anemia    history of   Anxiety    Arthritis    Asthma    Breast cancer ([place])    Cancer ([place])    skin cancers, one melanoma    Complication of anesthesia    patient woke up during colonoscopy   Concussion    8 yrs. 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    Keratosis, actinic     Melanoma (HCC)    Persistent atrial fibrillation ([facility]) 12/16/2016   CHA2DS2VASC score 3   Pneumonia    hx of     Medications:  Scheduled:   arformoterol  15 mcg Nebulization BID   bacitracin   Topical BID   budesonide (PULMICORT) nebulizer solution  0.25 mg Nebulization BID   insulin aspart  0-9 Units Subcutaneous Q4H   lidocaine  10 mL Other Once   metoprolol tartrate  2.5 mg Intravenous Q6H   mupirocin ointment   Nasal BID   pantoprazole (PROTONIX) IV  40 mg Intravenous Q24H   revefenacin  175 mcg Nebulization Daily    Assessment: 67 yof presenting with acute hypoxemia related to COPD excerbation/PNA complicated by PEA arrest  - on Xarelto PTA for Afib.  Pharmacy is consulted to resume heparin for atrial fibrillation given patient is stable s/p abdominal surgery with no further procedures planned. DOAC was briefly resumed inpatient and stopped 8/5, resumed 8/16, and stopped again on 8/20. Given stable CBC with no bleeding, pharmacy has been consulted to resume heparin. Heparin level of 0.5 therapeutic at 1100 units/hr.   Goal  of Therapy:  Heparin level 0.3-0.7 units/ml aPTT 66-102 seconds Monitor platelets by anticoagulation protocol: Yes   Plan:  Heparin level therapeutic at 1100 units/hr  Heparin level daily Monitor CBC, and for s/sx of bleeding   Thank you for allowing pharmacy to participate in this patient's care.  Reatha Harps, PharmD PGY2 Pharmacy Resident 02/02/2022 8:01 AM Check [email or web address] for unit specific pharmacy number

## 2022-02-02 NOTE — Plan of Care (Signed)
  Problem: Clinical Measurements: Goal: Ability to maintain clinical measurements within normal limits will improve Outcome: Progressing Goal: Respiratory complications will improve Outcome: Progressing Goal: Cardiovascular complication will be avoided Outcome: Progressing   Problem: Coping: Goal: Level of anxiety will decrease Outcome: Progressing

## 2022-02-02 NOTE — Progress Notes (Signed)
[facility] Progress Note  6 Days Post-Op  Subjective: On 3L [place].  Denies nausea.  Still with ostomy function.  Does not want to do Cortrak yet.  Wants to see how well she can eat on her own first.  Objective: Vital signs in last 24 hours: Temp:  [98.4 F (36.9 C)-102.8 F (39.3 C)] 98.4 F (36.9 C) (08/29 0753) Pulse Rate:  [94-124] 105 (08/29 0700) Resp:  [14-29] 21 (08/29 0700) BP: (81-166)/(60-91) 150/83 (08/29 0700) SpO2:  [85 %-100 %] 97 % (08/29 0807) FiO2 (%):  [35 %-70 %] 35 % (08/28 2111) Weight:  [74.1 kg] 74.1 kg (08/29 0350) Last BM Date : 01/26/22  Intake/Output from previous day: 08/28 0701 - 08/29 0700 In: 2070.1 [I.V.:1929.1; IV Piggyback:141] Out: 5053 [Urine:3835; Drains:50; Stool:250] Intake/Output this shift: No intake/output data recorded.  PE: Gen:  Alert Abd: Soft, appropriately tender, ostomy viable with thin bilious fluid in bag Msk: 2+ edema BUE/BLE  Lab Results:  Recent Labs    02/01/22 0407 02/02/22 0615  WBC 9.6 10.2  HGB 10.0* 9.7*  HCT 33.3* 31.6*  PLT 255 233   BMET Recent Labs    01/31/22 0411 02/01/22 0407  NA 146* 146*  K 4.3 4.0  CL 110 105  CO2 32 36*  GLUCOSE 106* 118*  BUN 22 22  CREATININE 0.64 0.51  CALCIUM 9.2 8.8*   PT/INR No results for input(s): "LABPROT", "INR" in the last 72 hours. CMP     Component Value Date/Time   NA 146 (H) 02/01/2022 0407   NA 139 11/18/2021 1432   NA 134 (L) 04/25/2017 1216   K 4.0 02/01/2022 0407   K 4.3 04/25/2017 1216   CL 105 02/01/2022 0407   CO2 36 (H) 02/01/2022 0407   CO2 29 04/25/2017 1216   GLUCOSE 118 (H) 02/01/2022 0407   GLUCOSE 85 04/25/2017 1216   BUN 22 02/01/2022 0407   BUN 10 11/18/2021 1432   BUN 14.4 04/25/2017 1216   CREATININE 0.51 02/01/2022 0407   CREATININE 0.72 01/17/2018 1347   CREATININE 0.7 04/25/2017 1216   CALCIUM 8.8 (L) 02/01/2022 0407   CALCIUM 10.0 04/25/2017 1216   PROT 5.6 (L) 02/01/2022 0407   PROT 7.2 04/25/2017 1216    ALBUMIN 2.0 (L) 02/01/2022 0407   ALBUMIN 3.7 04/25/2017 1216   AST 19 02/01/2022 0407   AST 20 01/17/2018 1347   AST 21 04/25/2017 1216   ALT 19 02/01/2022 0407   ALT 10 01/17/2018 1347   ALT 10 04/25/2017 1216   ALKPHOS 78 02/01/2022 0407   ALKPHOS 75 04/25/2017 1216   BILITOT 0.8 02/01/2022 0407   BILITOT 0.5 01/17/2018 1347   BILITOT 0.65 04/25/2017 1216   GFRNONAA >60 02/01/2022 0407   GFRNONAA >60 01/17/2018 1347   GFRAA 90 04/24/2020 1235   GFRAA >60 01/17/2018 1347   Lipase     Component Value Date/Time   LIPASE 77 07/28/2008 1630       Studies/Results: DG CHEST PORT 1 VIEW  Result Date: 02/01/2022 CLINICAL DATA:  Wheezing. EXAM: PORTABLE CHEST 1 VIEW COMPARISON:  02/01/2022. FINDINGS: The heart size and mediastinal contours are stable. There is elevation of the right diaphragm with stable patchy airspace disease in the right lung and at the left lung base. Small pleural effusions are noted bilaterally. No pneumothorax. A left-sided PICC line is stable in position. Surgical clips are present in the right upper quadrant. No acute osseous abnormality. IMPRESSION: 1. Elevation of the right diaphragm  with slightly increased patchy airspace disease in the mid to lower right lung and left lung base. 2. Small bilateral pleural effusions. Electronically Signed   By: Brett Fairy M.D.   On: 02/01/2022 21:14   DG CHEST PORT 1 VIEW  Result Date: 02/01/2022 CLINICAL DATA:  Post thoracentesis EXAM: PORTABLE CHEST 1 VIEW COMPARISON:  Earlier same day FINDINGS: Decreased right pleural effusion. No pneumothorax. Persistent probable patchy right lung atelectasis. Similar left pleural effusion and adjacent atelectasis. Stable cardiomediastinal contours. IMPRESSION: Decreased right pleural effusion and no pneumothorax post thoracentesis. Electronically Signed   By: Macy Mis M.D.   On: 02/01/2022 09:24   DG CHEST PORT 1 VIEW  Result Date: 02/01/2022 CLINICAL DATA:  Respiratory  distress. EXAM: PORTABLE CHEST 1 VIEW COMPARISON:  01/31/2022. FINDINGS: Left arm PICC line tip is in the cavoatrial junction. Stable cardiomediastinal contours. Moderate to large right pleural effusion and small left pleural effusion unchanged from previous exam. There is significantly diminished aeration to the right lower lung and right midlung which appears unchanged from the previous study. IMPRESSION: 1. No change in aeration to the right lower lung and right midlung compared with previous exam. 2. Stable bilateral pleural effusions, right greater than left. Electronically Signed   By: Kerby Moors M.D.   On: 02/01/2022 06:54   DG CHEST PORT 1 VIEW  Result Date: 01/31/2022 CLINICAL DATA:  Acute respiratory failure EXAM: PORTABLE CHEST 1 VIEW COMPARISON:  Chest x-ray dated January 31, 2022 FINDINGS: Partially visualized cardiac and mediastinal contours are unchanged. Bibasilar atelectasis and moderate right and small left pleural effusions, unchanged. No evidence of pneumothorax. IMPRESSION: Bibasilar atelectasis and moderate right and small left pleural effusions, unchanged. Electronically Signed   By: Yetta Glassman M.D.   On: 01/31/2022 16:05    Anti-infectives: Anti-infectives (From admission, onward)    Start     Dose/Rate Route Frequency Ordered Stop   01/26/22 1400  piperacillin-tazobactam (ZOSYN) IVPB 3.375 g        3.375 g 12.5 mL/hr over 240 Minutes Intravenous Every 8 hours 01/26/22 0945 02/02/22 0150   01/12/2022 1030  vancomycin (VANCOREADY) IVPB 750 mg/150 mL  Status:  Discontinued        750 mg 150 mL/hr over 60 Minutes Intravenous Every 24 hours 01/24/2022 0937 01/28/22 1520   01/23/2022 1000  vancomycin (VANCOREADY) IVPB 1500 mg/300 mL  Status:  Discontinued        1,500 mg 150 mL/hr over 120 Minutes Intravenous Every 48 hours 01/23/22 0909 01/08/2022 0937   01/23/22 1030  vancomycin (VANCOREADY) IVPB 1250 mg/250 mL        1,250 mg 166.7 mL/hr over 90 Minutes Intravenous  Once  01/23/22 0907 01/23/22 1259   01/23/22 1000  metroNIDAZOLE (FLAGYL) IVPB 500 mg  Status:  Discontinued        500 mg 100 mL/hr over 60 Minutes Intravenous Every 12 hours 01/23/22 0839 01/26/22 0945   01/23/22 1000  ceFEPIme (MAXIPIME) 2 g in sodium chloride 0.9 % 100 mL IVPB  Status:  Discontinued        2 g 200 mL/hr over 30 Minutes Intravenous Every 12 hours 01/23/22 0901 01/26/22 0945   01/17/2022 1400  micafungin (MYCAMINE) 100 mg in sodium chloride 0.9 % 100 mL IVPB  Status:  Discontinued        100 mg 105 mL/hr over 1 Hours Intravenous Every 24 hours 02/03/2022 1302 01/15/22 1211   01/10/22 1515  piperacillin-tazobactam (ZOSYN) IVPB 3.375 g  3.375 g 12.5 mL/hr over 240 Minutes Intravenous Every 8 hours 01/10/22 1427 01/29/2022 2359   01/04/22 2000  vancomycin (VANCOCIN) IVPB 1000 mg/200 mL premix  Status:  Discontinued        1,000 mg 200 mL/hr over 60 Minutes Intravenous Every 24 hours 01/03/22 1910 01/05/22 1321   01/03/22 2200  ceFEPIme (MAXIPIME) 2 g in sodium chloride 0.9 % 100 mL IVPB        2 g 200 mL/hr over 30 Minutes Intravenous Every 12 hours 01/03/22 1910 01/08/22 2209   01/03/22 1930  vancomycin (VANCOREADY) IVPB 1250 mg/250 mL        1,250 mg 166.7 mL/hr over 90 Minutes Intravenous  Once 01/03/22 1910 01/03/22 2219   01/02/22 2100  azithromycin (ZITHROMAX) 500 mg in sodium chloride 0.9 % 250 mL IVPB        500 mg 250 mL/hr  Intravenous Every 24 hours 12/26/2021 2248 01/06/22 2149   01/02/22 2000  cefTRIAXone (ROCEPHIN) 2 g in sodium chloride 0.9 % 100 mL IVPB  Status:  Discontinued        2 g 200 mL/hr over 30 Minutes Intravenous Every 24 hours 12/28/2021 2248 01/03/22 1733   12/05/2021 2030  cefTRIAXone (ROCEPHIN) 1 g in sodium chloride 0.9 % 100 mL IVPB        1 g 200 mL/hr over 30 Minutes Intravenous  Once 12/31/2021 2025 12/30/2021 2141   12/05/2021 2030  azithromycin (ZITHROMAX) 500 mg in sodium chloride 0.9 % 250 mL IVPB        500 mg 250 mL/hr over 60 Minutes  Intravenous  Once 12/17/2021 2025 01/04/2022 2312        Assessment/Plan POD #22/20, S/p exploratory laparotomy,  R colectomy, abthera placement 01/14/2022 Dr. Rosendo Gros S/p exploratory laparotomy, intestinal anastomosis, abdominal closure 01/09/2022 Dr. 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 Redmond Pulling - Ostomy with some thin bilious output. Continue TPN for now.  -MBS when appropriate from clinical status standpoint, if passes may have CLD.  Does not want Cortrak yet.  Wants to give herself the opportunity to try and eat first. - WOC consult for new ileostomy and VAC - Vac MWF - continue zosyn x5 days postop (end date 8/28), WBC normal   FEN: NPO except ice chips, TNA  ID: Zosyn (stop date 8/14), cefepime/flagyl 8/19 >>8/21, Vanc/zosyn 8/21 --> 8/28 VTE: SCD's, hold DOAC/ heparin gtt Dispo: ICU   Below per TRH/CCM -- Septic shock due to above Acute hypoxic respiratory failure - per CCM, extubated on 8/26, but tenuous Cardiac arrest 7/30, 7 mins CPR Hx cardiomyopathy COPD Afib, permanent CAP DNR    LOS: 42 days    Henreitta Cea, [facility] 02/02/2022, 8:14 AM Please see Amion for pager number during day hours 7:00am-4:30pm

## 2022-02-02 NOTE — Telephone Encounter (Signed)
Pharmacy Patient Advocate Encounter  Insurance verification completed.    The patient is insured through [employer] Part D   The patient is currently admitted and ran test claims for the following: Eliquis.  Copays and coinsurance results were relayed to Inpatient clinical team.

## 2022-02-02 NOTE — Progress Notes (Signed)
PHARMACY - TOTAL PARENTERAL NUTRITION CONSULT NOTE  Indication: Intolerance to enteral feeding / anastomotic leak  Patient Measurements: Height: 5' (152.4 cm) Weight: 74.1 kg (163 lb 5.8 oz) IBW/kg (Calculated) : 45.5 TPN AdjBW (KG): 53.7 Body mass index is 31.9 kg/m.  Assessment:  69 YOF admited for CAP causing respiratory failure, had a cardiac arrest after admission and was moved to the ICU.  Chest tube placed for parapneumonic effusion, extubated on 8/2. Sent out of ICU on 8/5. On 8/6 she developed ischemic colitis, initially treated conservatively but transferred back to ICU for worsening shock and confusion 8/7.  Now s/p ex lap, R colectomy for ischemic bowel with perforation. Pt was provided TPN from 8/9-8/15. Pt's diet was advanced and pt ate 25-50% of meals from 8/15-8/20. Diet was changed to CLD on 8/20 for IR placement of drain for anastomotic leak. However, drain was not placed because pt acutely decompensated and was intubated and admitted to ICU and made NPO on 8/21. IR placed drain on 8/22 and drainage was positive for enteric contents confirming anastomotic leak. Surgery plans to perform resection of anastomosis and possible diversion with ileostomy on 8/23. Pharmacy consulted to manage TPN.  Glucose / Insulin: A1c 4.9% - CBGs < 180.  Required 0 unit of SSI  Electrolytes: high-stable Na 146 (was hypoNa earlier in admission), K WNL at 4 (goal >=4), Mag 2 (goal >/= 2 for ileus and Afib), CoCa down to 10.4 after removal from TPN (Ca x Phos = 39, goal < 55), others WNL  Renal: SCr < 1, BUN WNL  Hepatic: LFTs / tbili / TG WNL, albumin 2 Intake / Output; MIVF: UOP 2.4 ml/kg/hr, NG 69m, drain removed, stool 25 mL, LR 268mhr, net +3.3 L, Lasix given 8/28 x2 doses GI Imaging: 8/6 KUB: Mild gaseous distension favors ileus  8/6 CT: concerning for iscehmic colitis; possible SBO and/or inflammatory ileus  8/7 KUB: diffuse gaseous small bowel and colonic distension 8/20 CT: extraluminal  collection of gas and fluid possibly representing an anastomotic leak, postop fluid collection, or abscess GI Surgeries / Procedures:  8/7 Ex lap, colon resection 8/9 Ex lap, intestinal anastomosis, wound closure 8/22 CT-guided anterior RLQ abscess drain placement 8/23 Ex lap with resection of distal small bowel and ileocolonic anastomosis, creation of end ileostomy, and lysis of adhesions   Central access: PICC placed 01/12/22 TPN start date:  01/20/2022-01/19/22, restarted 8/23  Nutritional Goals: RD Estimated Needs Total Energy Estimated Needs: 1700-1900 kcals Total Protein Estimated Needs: 95-115 g Total Fluid Estimated Needs: >/= 1.5 L  Current Nutrition:  NPO and TPN   Plan:  Continue concentrated TPN at goal rate of 65 ml/hr to provide 104g AA, 234g CHO, 61g lipids, and 1821 kCal, meeting 100% of nutrition needs Electrolytes in TPN: continue Na 56m856mL, K increased to 656m43m on 8/26 (= 94mE44my), Ca removed 8/26, max Mg at 15mEq8m= ~3g/day), decreased Phos to 22mmol156mn 8/26, max CL for now  Add standard MVI and trace elements to TPN  D/C SSI/CBG checks Standard TPN labs on Mon/Thurs -follow-up labs in AM F/u swallow eval and ability to start an oral diet vs placing cortrak for TF Due to 2 lasix doses - will add labs and replace outside of TPN if needed  JessicaSloan LeiterD, BCPS, BCCCP Clinical Pharmacist Please refer to AMION [facility] 02/02/2022, 7:07 AM

## 2022-02-02 NOTE — Progress Notes (Signed)
Occupational Therapy Treatment Patient Details Name: [name] MRN: [id number] DOB: October 05, 1940 Today's Date: 02/02/2022   History of present illness 81 y.o. female admitted 12/05/2021 with CAP.  7/30 AMS, head CT negative for acute abnormality. Code blue 7/30, ROSC achieved after 7 min CPR, pt intubated. Chest tube insertion 7/31; pt removed CT 8/1. ETT 7/30-8/2. Continued AMS, hallucinations overnight 8/2; head CT 8/2 remains negative for acute abnormality. EEG 8/2 with no seizure activity. Improved cognition 8/5, but persistent tachycardia/afib with RVR. 8/6 ischemic colitis. 8/7 shock with intubation and to OR for ex lap. 8/9 abdomen closure. 8/10 extubation. 8/21 respiratory distress with reintubation. 8/23 return to OR for ex lap and small bowel resection with ileostomy. Extubated 8/26 and thoracentesis. PMHx:Afib, HTN, breast CA, melanoma, asthma, bilateral TKA, anxiety.   OT comments  This 81 yo female seen today to work on precursors to ADLs. Pt able to come up to sit EOB with Mod A and VCs for sequencing (more A for trunk than legs), sat EOB with Bil UE support ~10 minutes, stood with Min A +2 at EOB, and took side steps up towards [name] with Mod A +2. She will continue to benefit from acute OT with follow up OT now changed to AIR since pt is off of vent.We will continue to follow.   Recommendations for follow up therapy are one component of a multi-disciplinary discharge planning process, led by the attending physician.  Recommendations may be updated based on patient status, additional functional criteria and insurance authorization.    Follow Up Recommendations  Acute inpatient rehab (3hours/day)    Assistance Recommended at Discharge Frequent or constant Supervision/Assistance  Patient can return home with the following  Assistance with cooking/housework;Assistance with feeding;Direct supervision/assist for medications management;Direct supervision/assist for financial  management;Assist for transportation;Help with stairs or ramp for entrance;Two people to help with walking and/or transfers;A lot of help with bathing/dressing/bathroom   Equipment Recommendations  Other (comment) (TBD next venue)    Recommendations for Other Services Rehab consult    Precautions / Restrictions Precautions Precautions: Fall;Other (comment) Precaution Comments: NGT, ileostomy, PICC line Restrictions Weight Bearing Restrictions: No       Mobility Bed Mobility Overal bed mobility: Needs Assistance Bed Mobility: Rolling, Sidelying to Sit, Sit to Supine Rolling: Min assist Sidelying to sit: Mod assist, HOB elevated (A for trunk and VCs for legs/sequencing)   Sit to supine: Max assist, +2 for physical assistance        Transfers Overall transfer level: Needs assistance Equipment used: 2 person hand held assist Transfers: Sit to/from Stand Sit to Stand: Min assist, +2 physical assistance                 Balance Overall balance assessment: Needs assistance Sitting-balance support: Bilateral upper extremity supported, Feet supported Sitting balance-Leahy Scale: Poor     Standing balance support: Bilateral upper extremity supported Standing balance-Leahy Scale: Poor Standing balance comment: heavy reliance on BUE support in standing                           ADL either performed or assessed with clinical judgement   ADL Overall ADL's : Needs assistance/impaired                           Toilet Transfer Details (indicate cue type and reason): min A +2 sit<>stand from EOB, Mod A +2 side step to [facility]; Bil HHA Toileting-  Clothing Manipulation and Hygiene: Total assistance Toileting - Clothing Manipulation Details (indicate cue type and reason): Min A +2 sit<>stand            Extremity/Trunk Assessment Upper Extremity Assessment Upper Extremity Assessment: Generalized weakness (Grossly 3/5 throughout Bil UEs)             Vision Patient Visual Report: No change from baseline            Cognition Arousal/Alertness: Awake/alert Behavior During Therapy: Anxious (about standing at EOB) Overall Cognitive Status: Within Functional Limits for tasks assessed                                                     Pertinent Vitals/ Pain       Pain Assessment Pain Assessment: No/denies pain         Frequency  Min 2X/week        Progress Toward Goals  OT Goals(current goals can now be found in the care plan section)  Progress towards OT goals: Progressing toward goals  Acute Rehab OT Goals Time For Goal Achievement: 02/12/22 Potential to Achieve Goals: Good  Plan Discharge plan needs to be updated       AM-PAC OT "6 Clicks" Daily Activity     Outcome Measure   Help from another person eating meals?: Total (NPO) Help from another person taking care of personal grooming?: A Little Help from another person toileting, which includes using toliet, bedpan, or urinal?: A Lot Help from another person bathing (including washing, rinsing, drying)?: A Lot Help from another person to put on and taking off regular upper body clothing?: A Lot Help from another person to put on and taking off regular lower body clothing?: Total 6 Click Score: 11    End of Session Equipment Utilized During Treatment: Gait belt  OT Visit Diagnosis: Other abnormalities of gait and mobility (R26.89);Other symptoms and signs involving cognitive function;Muscle weakness (generalized) (M62.81)   Activity Tolerance Patient tolerated treatment well   Patient Left in bed;with call bell/phone within reach;with bed alarm set;with family/visitor present   Nurse Communication  (RN assisted me with the +2)        Time: 1025-1050 OT Time Calculation (min): 25 min  Charges: OT General Charges $OT Visit: 1 Visit OT Treatments $Self Care/Home Management : 23-37 mins  [address] [address], OTR/L Acute Rehab  [facility] [phone] Office [phone]   Almon Register 02/02/2022, 11:17 AM

## 2022-02-02 NOTE — Progress Notes (Addendum)
NAME:  [name], MRN:  [id number], DOB:  Dec 22, 1940, LOS: 23 ADMISSION DATE:  12/07/2021, CONSULTATION DATE:  01/19/2022 REFERRING MD:  Bonner Puna - [facility] CHIEF COMPLAINT:  Hypotension, abdominal pain   History of Present Illness:  81 year old woman who presented to [facility] 7/28 with CAP causing respiratory failure. Suffered cardiac arrest 7/30 and transferred to ICU, intubated. CT placed for parapneumonic effusion 7/31, removed 8/2 and extubated. Transferred to floor 8/5.   On 8/6, patient developed ischemic colitis (initially treated conservatively) however patient clinically declined and PCCM was reconsulted 8/7 for worsening shock, confusion. CCS consulted. Exploratory laparotomy performed by general surgery 8/7 for ischemic colitis with perforation s/p right colectomy and repeat laparotomy on 8/9 with intestinal anastomosis and abdominal closure. Patient was extubated 8/11 and transferred to the general medical floor.   CT A/P was completed 8/20 demonstrating R sided abdominal fluid collection with c/f anastomotic leak. Patient underwent IR CT-guided drain placement with feculent contents consistent with anastomotic leak/feculent peritonitis. Rapid response was called 8/21 in the setting of respiratory distress; patient was reintubated and transferred back to the ICU. Underwent OR takeback 8/23 with CCS with findings of anastomotic leak with dense adhesions requiring extensive LOA and SBR as well as end ileostomy creation.  PCCM following for vent/medical management.  Pertinent Medical History:  Breast cancer on letrozole Atrial fibrillation  Significant Hospital Events: Including procedures, antibiotic start and stop dates in addition to other pertinent events   7/28 admit, Ceftriaxone/Azithro 7/29 last dose xeralto  7/30 IHCA x 7 min, Ceftriaxone>Cefepime 7/31 Chest tube placed, heparin gtt started 8/1 patient remove chest tube.  Head CT obtained >negative.  Severely agitated overnight.   Vancomycin stopped.  Azithromycin stopped. 8/6 Transferred to [facility] 8/7 Called CCM back for abd, Distention , hypotension and a fib with RVR, plan is for  urgent OR pending goals of care discussion then ICU post op if husband is in agreement. 8/10 Abdomen closed yesterday, SBT/SAT today 8/11 Extubated yesterday, doing well on [place], up OOB, feeling alright 8/13 Transferred to medical floor 8/14 Chest ultrasound shows atelectasis without significant right basilar effusion. 8/20 CT A/P with fuid collection in R mid-abdomen with c/f anastomotic leak. 8/21 Back to ICU for resp distress 8/22 CT-guided drain placement (RLQ) for fluid assessment, abscess vs. Anastomotic leak 8/23 Taken back to OR for ex-lap/concern for anastomotic leak; extensive LOA with findings of feculent peritonitis with +leak; enterotomies requiring additional SBR and end ileostomy. 8/24 POD#1 from [place]. Unable to wean vent this AM. Following simple commands on Precedex 0.5, Levo 8. No output from new ileostomy. 8/25 POD#2 from [place]. Agitated overnight. Weaning on PSV 12/5. Awake, following commands. Levo 47mg. Scant serous output in ostomy bag.  Made DNR after palliative meeting 8/26 Extubated. Right thoracentesis with removal of 600 cc 8/28 respiratory distress, urgent thoracentesis removal of 600 cc, diuresis with Lasix, came off BiPAP  Interim History / Subjective:   Improved with Lasix, BiPAP and thoracenteses yesterday. Off BiPAP. Received second dose of Lasix overnight, diuresed 3.8 L Remains tachycardic this morning  Objective:  Blood pressure (!) 150/83, pulse (!) 105, temperature 98.4 F (36.9 C), temperature source Oral, resp. rate (!) 21, height 5' (1.524 m), weight 74.1 kg, SpO2 97 %.    FiO2 (%):  [35 %] 35 %   Intake/Output Summary (Last 24 hours) at 02/02/2022 0904 Last data filed at 02/02/2022 0646 Gross per 24 hour  Intake 2070.05 ml  Output 4135 ml  Net -  2064.95 ml    Filed  Weights   01/31/22 0000 02/01/22 0500 02/02/22 0350  Weight: 78.4 kg 78.3 kg 74.1 kg   Physical Examination: Gen:     chronically ill-appearing , mild distress HEENT:  EOMI, sclera anicteric Neck:     No jvd; no thyromegaly Lungs:    Bilateral expiratory rhonchi, no accessory muscle use CV:         Regular rate and rhythm; no murmurs Abd:      Abdominal wound inspected during VAC change, good granulation tissue, colostomy with good output Ext:    Diffuse anasarca; decreased mottling Skin:      Warm and dry; no rash Neuro: Awake, interactive, weak, moves all 4 extremities  Labs/imaging reviewed  Stable hypernatremia, stable renal function, no leukocytosis, stable anemia Chest x-ray independently reviewed  -shows improved right effusion postthoracentesis, no pneumothorax  Resolved Hospital Problem List:   Metabolic Acidosis Hypokalemia Hypomagnesemia Septic Shock- intrabdominal abscess and possible aspiration pneumonia with new dense RLL opacity. 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 8/22 CT-guided drain placement (RLQ) for fluid assessment, abscess vs. anastomotic leak. Taken back to OR 8/23 for ex-lap/concern for anastomotic leak; extensive LOA with findings of feculent peritonitis with +leak; enterotomies requiring additional SBR and end ileostomy.  Postoperative management per CCS Wound dressing to abdominal wounds Completed  antibiotics Surgery and wound care following.   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   -IV heparin for now, resume DOAC at some point once improved and no procedures required -increase metoprolol to 5 q 6h   Acute anemia, symptomatic  Multifactorial in the setting of surgery, critical illness, iatrogenic losses. Follow CBC, transfuse for hemoglobin less than 7  Severe protein calorie malnutrition  Diet advancement per CCS. Can consider placing cortrak tomorrow. -barium swallow planned today and if passes can advance to clear liquid diet Continue TPN  GOC Goals of care meeting on 8/25.  She is currently DNR.  Palliative care following. Husband updated daily  Best Practice (right click and "Reselect all SmartList Selections" daily)   Diet/type: NPO DVT prophylaxis: SCD GI prophylaxis: PPI Lines: Central line Foley:  Yes, and it is still needed Code Status:  DNR Last date of multidisciplinary goals of care discussion [n/a].  Husband updated at bedside.  Critical care time: 68 m   The patient is critically ill with multiple organ system failure and requires high complexity decision making for assessment and support, frequent evaluation and titration of therapies, advanced monitoring, review of radiographic studies and interpretation of complex data.   Critical Care Time devoted to patient care services, exclusive of separately billable procedures, described in this note is 31 minutes.  Leanna Sato Elsworth Soho, [facility] 02/02/22 9:04 AM  Please see [email or web address] for pager details.  From 7A-7P if no response, please call [phone] After hours, please call ELink [phone]

## 2022-02-02 NOTE — TOC Benefit Eligibility Note (Signed)
Patient Teacher, English as a foreign language completed.    The patient is currently admitted and upon discharge could be taking Eliquis 5 mg.  The current 30 day co-pay is $35.00.   The patient is insured through [place], [place] Patient Advocate Specialist [place] Patient Advocate Team Direct Number: [phone]  Fax: [phone]

## 2022-02-02 NOTE — Progress Notes (Signed)
Physical Therapy Treatment Patient Details Name: [name] MRN: [id number] DOB: 28-Jan-1941 Today's Date: 02/02/2022   History of Present Illness 81 y.o. female admitted 12/25/2021 with CAP.  7/30 AMS, head CT negative for acute abnormality. Code blue 7/30, ROSC achieved after 7 min CPR, pt intubated. Chest tube insertion 7/31; pt removed CT 8/1. ETT 7/30-8/2. Continued AMS, hallucinations overnight 8/2; head CT 8/2 remains negative for acute abnormality. EEG 8/2 with no seizure activity. Improved cognition 8/5, but persistent tachycardia/afib with RVR. 8/6 ischemic colitis. 8/7 shock with intubation and to OR for ex lap. 8/9 abdomen closure. 8/10 extubation. 8/21 respiratory distress with reintubation. 8/23 return to OR for ex lap and small bowel resection with ileostomy. Extubated 8/26 and thoracentesis. 8/28 respiratory distress with urgent thoracentesis. PMHx:Afib, HTN, breast CA, melanoma, asthma, bilateral TKA, anxiety.    PT Comments    Pt reporting abdominal pain with medication provided during session. Spouse and son present and encouraging pt to move. Pt able to transition to sitting EOB but would not attempt standing even with assist and stedy as well as max encouragement. Pt with limited problem solving and delayed processing this session. Pt self-limiting and provided reassurance and cues for self-affirmation to progress with pt stating "I can" end of session once back in bed. Extremities elevated due to edema and pt/family educated for bil UE and LE HEP in bed to maximize strength and function. Will continue to follow.   98% on 2L    Recommendations for follow up therapy are one component of a multi-disciplinary discharge planning process, led by the attending physician.  Recommendations may be updated based on patient status, additional functional criteria and insurance authorization.  Follow Up Recommendations  PT at Long-term acute care hospital Can patient physically be  transported by private vehicle: No   Assistance Recommended at Discharge Frequent or constant Supervision/Assistance  Patient can return home with the following Direct supervision/assist for medications management;Assistance with feeding;Assistance with cooking/housework;Direct supervision/assist for financial management;Assist for transportation;A lot of help with walking and/or transfers;A lot of help with bathing/dressing/bathroom   Equipment Recommendations  Rolling walker (2 wheels);BSC/3in1    Recommendations for Other Services       Precautions / Restrictions Precautions Precautions: Fall;Other (comment) Precaution Comments: NGT, ileostomy, PICC line Restrictions Weight Bearing Restrictions: No     Mobility  Bed Mobility Overal bed mobility: Needs Assistance Bed Mobility: Rolling, Sidelying to Sit Rolling: Min assist Sidelying to sit: Mod assist, HOB elevated       General bed mobility comments: min assist with pad, increased time and cues to roll to left. Mod assist to elevate trunk from surface and bring legs off EOB. Return to bed with max assist. Max +2 to slide toward [facility]    Transfers                   General transfer comment: Charlaine Dalton present and bed elevated with max encouragement grossly 20 min EOB pt refused to attempted to stand despite repeated movements of hands onto and off of stedy bar as well as staff and family encouragement    Ambulation/Gait               General Gait Details: unable   Stairs             Wheelchair Mobility    Modified Rankin (Stroke Patients Only)       Balance Overall balance assessment: Needs assistance   Sitting balance-Leahy Scale: Fair Sitting balance - Comments:  minguard sitting EOB                                    Cognition Arousal/Alertness: Awake/alert Behavior During Therapy: Anxious Overall Cognitive Status: Impaired/Different from baseline Area of Impairment:  Orientation                 Orientation Level: Disoriented to, Time Current Attention Level: Selective Memory: Decreased short-term memory Following Commands: Follows one step commands inconsistently, Follows one step commands with increased time     Problem Solving: Slow processing General Comments: pt not answering all questions asked, delayed response, very self-limiting        Exercises      General Comments        Pertinent Vitals/Pain Pain Assessment Pain Score: 6  Pain Location: abdomen Pain Descriptors / Indicators: Guarding, Aching Pain Intervention(s): Limited activity within patient's tolerance, Monitored during session, RN gave pain meds during session, Repositioned    Home Living                          Prior Function            PT Goals (current goals can now be found in the care plan section) Progress towards PT goals: Not progressing toward goals - comment    Frequency    Min 3X/week      PT Plan Current plan remains appropriate    Co-evaluation              AM-PAC PT "6 Clicks" Mobility   Outcome Measure  Help needed turning from your back to your side while in a flat bed without using bedrails?: A Lot Help needed moving from lying on your back to sitting on the side of a flat bed without using bedrails?: A Lot Help needed moving to and from a bed to a chair (including a wheelchair)?: Total Help needed standing up from a chair using your arms (e.g., wheelchair or bedside chair)?: Total Help needed to walk in hospital room?: Total Help needed climbing 3-5 steps with a railing? : Total 6 Click Score: 8    End of Session Equipment Utilized During Treatment: Oxygen Activity Tolerance: Other (comment) (pt self-limiting session) Patient left: in bed;with call bell/phone within reach;with family/visitor present;Other (comment) (with SLP for fees) Nurse Communication: Mobility status PT Visit Diagnosis: Other  abnormalities of gait and mobility (R26.89);Difficulty in walking, not elsewhere classified (R26.2);Muscle weakness (generalized) (M62.81)     Time: 7619-5093 PT Time Calculation (min) (ACUTE ONLY): 48 min  Charges:  $Therapeutic Activity: 38-52 mins                     Bayard Males, PT Acute Rehabilitation Services Office: [phone]    Sandy Salaam Charlis Harner 02/02/2022, 1:18 PM

## 2022-02-02 NOTE — Procedures (Addendum)
Objective Swallowing Evaluation: Type of Study: FEES-Fiberoptic Endoscopic Evaluation of Swallow   Patient Details  Name: [name] MRN: [id number] Date of Birth: 1941/05/17  Today's Date: 02/02/2022 Time: SLP Start Time (ACUTE ONLY): 1300 -SLP Stop Time (ACUTE ONLY): 1340  SLP Time Calculation (min) (ACUTE ONLY): 40 min   Past Medical History:  Past Medical History:  Diagnosis Date   Anemia    history of   Anxiety    Arthritis    Asthma    Breast cancer ([place])    Cancer ([place])    skin cancers, one melanoma    Complication of anesthesia    patient woke up during colonoscopy   Concussion    8 yrs. 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    Keratosis, actinic    Melanoma ([place])    Persistent atrial fibrillation ([place]) 12/16/2016   CHA2DS2VASC score 3   Pneumonia    hx of    Past Surgical History:  Past Surgical History:  Procedure Laterality Date   brachial skin removal due to deforimity Bilateral    BREAST ENHANCEMENT SURGERY Left 11/14/2018   Procedure: LEFT BREAST AUGMENTATION;  Surgeon: [name], MD;  Location: [place];  Service: Plastics;  Laterality: Left;   BREAST RECONSTRUCTION Right 06/26/2018   BREAST RECONSTRUCTION WITH PLACEMENT OF TISSUE EXPANDER AND ALLODERM Right 06/26/2018   Procedure: RIGHT BREAST RECONSTRUCTION WITH PLACEMENT OF TISSUE EXPANDER;  Surgeon: [name], MD;  Location: [place];  Service: Plastics;  Laterality: Right;   BREAST RECONSTRUCTION WITH PLACEMENT OF TISSUE EXPANDER AND FLEX HD (ACELLULAR HYDRATED DERMIS) Right 01/28/2017    RIGHT BREAST RECONSTRUCTION WITH PLACEMENT OF TISSUE  EXPANDER AND ALLODERM;  Surgeon: [name], MD;  Location: [place];  Service: Plastics;  Laterality: Right;   BUNIONECTOMY     left    CATARACT EXTRACTION     bilaterla cataract surgery    COLON RESECTION N/A 01/19/2022   Procedure: COLON RESECTION;  Surgeon: Ralene Ok, MD;  Location: [place];  Service: General;  Laterality: N/A;   COLONOSCOPY W/ POLYPECTOMY     JOINT REPLACEMENT     KNEE ARTHROSCOPY     left    LAPAROTOMY N/A 01/29/2022   Procedure: EXPLORATORY LAPAROTOMY;  Surgeon: Ralene Ok, MD;  Location: [place];  Service: General;  Laterality: N/A;   LAPAROTOMY N/A 01/10/2022   Procedure: EXPLORATORY LAPAROTOMY WITH INTESTINAL ANASTOMOSIS AND ABDOMINAL CLOSURE;  Surgeon: Ralene Ok, MD;  Location: [place];  Service: General;  Laterality: N/A;   LAPAROTOMY N/A 01/14/2022   Procedure: EXPLORATORY LAPAROTOMY WITH PARTIAL COLECTOMY , iLEOSTOMY;  Surgeon: Greer Pickerel, MD;  Location: [place];  Service: General;  Laterality: N/A;   LATISSIMUS FLAP TO BREAST Right 06/26/2018   Procedure: LATISSIMUS FLAP TO RIGHT CHEST;  Surgeon: [name], MD;  Location: [place];  Service: Plastics;  Laterality: Right;   LYSIS OF ADHESION N/A 01/23/2022   Procedure: LYSIS OF ADHESIONS;  Surgeon: Greer Pickerel, MD;  Location: [place];  Service: General;  Laterality: N/A;   MASTECTOMY Right    MASTECTOMY W/ SENTINEL NODE BIOPSY Right  01/28/2017   Procedure: RIGHT TOTAL MASTECTOMY WITH SENTINEL LYMPH NODE BIOPSY;  Surgeon: Excell Seltzer, MD;  Location: [place];  Service: General;  Laterality: Right;   MASTOPEXY Left 11/14/2018   Procedure: LEFT BREAST MASTOPEXY;  Surgeon: [name], MD;  Location: [place];  Service: Plastics;  Laterality: Left;   MELANOMA EXCISION Left    polyp removed from uterus      REMOVAL OF TISSUE EXPANDER AND PLACEMENT OF IMPLANT Right 11/14/2018   Procedure: REMOVAL OF RIGHT TISSUE EXPANDER AND PLACEMENT OF SALINE IMPLANT;  Surgeon: [name], MD;  Location: [place];  Service: Plastics;  Laterality: Right;   right wrist pinning      skin cancers removed     TISSUE EXPANDER PLACEMENT Right 02/27/2018   Procedure: REMOVAL OF TISSUE EXPANDER;  Surgeon: [name], MD;  Location: [place];  Service: Plastics;  Laterality: Right;   TOTAL KNEE ARTHROPLASTY Right 01/29/2013   Procedure: RIGHT TOTAL KNEE ARTHROPLASTY;  Surgeon: Gearlean Alf, MD;  Location: [facility];  Service: Orthopedics;  Laterality: Right;   TOTAL KNEE ARTHROPLASTY Left 08/29/2017   Procedure: LEFT TOTAL KNEE ARTHROPLASTY;  Surgeon: Gaynelle Arabian, MD;  Location: [facility];  Service: Orthopedics;  Laterality: Left;   HPI: 81 y.o. female admitted 12/17/2021 with CAP.  7/30 AMS, head CT negative for acute abnormality. Code blue 7/30, ROSC achieved after 7 min CPR, pt intubated. Chest tube insertion 7/31; pt removed CT 8/1. ETT 7/30-8/2. Continued AMS, hallucinations overnight 8/2; head CT 8/2 remains negative for acute abnormality. EEG 8/2 with no seizure activity. Improved cognition 8/5, but persistent tachycardia/afib with RVR. 8/6 ischemic colitis. 8/7 shock with intubation and to OR for ex lap. 8/9 abdomen closure. 8/10 extubation. 8/21 respiratory distress with reintubation. 8/23 return to OR for ex lap and small bowel resection with ileostomy. 8/26 extubation after consult with Palliative medicine; no plans for reintubation. Pt has been followed for swallowing since 8/3. Diet before last intubation was dysphagia 3/thin liquids. PMHx:Afib, HTN, breast CA, melanoma, asthma, bilateral TKA, anxiety.   Subjective: finishing session with PT    Recommendations for follow up therapy are one component of a multi-disciplinary discharge planning process, led by the attending physician.  Recommendations may be updated based on patient status, additional functional criteria and insurance authorization.  Assessment / Plan / Recommendation     02/02/2022    1:13  PM  Clinical Impressions  Clinical Impression Pt participated in bedside FEES with husband and son present.  Larynx with some erythema; no edema. There was good bilateral vocal fold mobility and full adduction. Standing secretions were noted in interarytenoid space. Pt was trialed with thin and nectar thick liquids only.  Swallow timing was normal. Normal pharyngeal stripping with no residue post-swallow. No penetration nor aspiration was viewed, even when taxed with sequential liquid boluses.  Pt may start clear liquid diet. Can have meds whole with liquids.  Advance diet per surgerical team.  SLP Visit Diagnosis Dysphagia, unspecified (R13.10)         02/02/2022    1:13 PM  Treatment Recommendations  Treatment Recommendations Therapy as outlined in treatment plan below        02/02/2022    1:13 PM  Prognosis  Prognosis for Safe Diet Advancement Good       02/02/2022    1:13 PM  Diet Recommendations  SLP Diet Recommendations Other (Comment)  Liquid Administration via Cup;Straw  Medication Administration Whole meds with liquid  02/02/2022    1:13 PM  Other Recommendations  Oral Care Recommendations Oral care BID  Follow Up Recommendations Other (comment)  Assistance recommended at discharge Frequent or constant Supervision/Assistance  Functional Status Assessment Patient has had a recent decline in their functional status and demonstrates the ability to make significant improvements in function in a reasonable and predictable amount of time.       02/02/2022    1:13 PM  Frequency and Duration   Speech Therapy Frequency (ACUTE ONLY) min 2x/week  Treatment Duration 1 week         02/02/2022    1:13 PM  Oral Phase  Oral Phase [facility]       02/02/2022    1:13 PM  Pharyngeal Phase  Pharyngeal Phase WFL         No data to display           [name] 02/02/2022, 1:43 PM   Michall Noffke L. Tivis [name], MA CCC/SLP Clinical Specialist - [place] Office number [phone]

## 2022-02-03 ENCOUNTER — Inpatient Hospital Stay (HOSPITAL_COMMUNITY): Payer: Medicare Other

## 2022-02-03 DIAGNOSIS — J189 Pneumonia, unspecified organism: Secondary | ICD-10-CM | POA: Diagnosis not present

## 2022-02-03 LAB — CYTOLOGY - NON PAP

## 2022-02-03 LAB — CBC
HCT: 30 % — ABNORMAL LOW (ref 36.0–46.0)
Hemoglobin: 9.2 g/dL — ABNORMAL LOW (ref 12.0–15.0)
MCH: 30.9 pg (ref 26.0–34.0)
MCHC: 30.7 g/dL (ref 30.0–36.0)
MCV: 100.7 fL — ABNORMAL HIGH (ref 80.0–100.0)
Platelets: 232 10*3/uL (ref 150–400)
RBC: 2.98 MIL/uL — ABNORMAL LOW (ref 3.87–5.11)
RDW: 18.7 % — ABNORMAL HIGH (ref 11.5–15.5)
WBC: 11.2 10*3/uL — ABNORMAL HIGH (ref 4.0–10.5)
nRBC: 0.2 % (ref 0.0–0.2)

## 2022-02-03 LAB — COMPREHENSIVE METABOLIC PANEL
ALT: 14 U/L (ref 0–44)
AST: 23 U/L (ref 15–41)
Albumin: 2 g/dL — ABNORMAL LOW (ref 3.5–5.0)
Alkaline Phosphatase: 92 U/L (ref 38–126)
Anion gap: 7 (ref 5–15)
BUN: 26 mg/dL — ABNORMAL HIGH (ref 8–23)
CO2: 36 mmol/L — ABNORMAL HIGH (ref 22–32)
Calcium: 9 mg/dL (ref 8.9–10.3)
Chloride: 97 mmol/L — ABNORMAL LOW (ref 98–111)
Creatinine, Ser: 0.51 mg/dL (ref 0.44–1.00)
GFR, Estimated: >60 mL/min (ref >60)
Glucose, Bld: 107 mg/dL — ABNORMAL HIGH (ref 70–99)
Potassium: 4.1 mmol/L (ref 3.5–5.1)
Sodium: 140 mmol/L (ref 135–145)
Total Bilirubin: 0.4 mg/dL (ref 0.3–1.2)
Total Protein: 5.9 g/dL — ABNORMAL LOW (ref 6.5–8.1)

## 2022-02-03 LAB — HEPARIN LEVEL (UNFRACTIONATED): Heparin Unfractionated: 0.54 IU/mL (ref 0.30–0.70)

## 2022-02-03 LAB — GLUCOSE, CAPILLARY
Glucose-Capillary: 107 mg/dL — ABNORMAL HIGH (ref 70–99)
Glucose-Capillary: 110 mg/dL — ABNORMAL HIGH (ref 70–99)
Glucose-Capillary: 104 mg/dL — ABNORMAL HIGH (ref 70–99)
Glucose-Capillary: 110 mg/dL — ABNORMAL HIGH (ref 70–99)
Glucose-Capillary: 117 mg/dL — ABNORMAL HIGH (ref 70–99)

## 2022-02-03 LAB — MAGNESIUM: Magnesium: 1.7 mg/dL (ref 1.7–2.4)

## 2022-02-03 MED ORDER — ENOXAPARIN SODIUM 100 MG/ML IJ SOSY
100.0000 mg | PREFILLED_SYRINGE | INTRAMUSCULAR | Status: DC
  Administered 2022-02-03 – 2022-02-09 (×7): 100 mg via SUBCUTANEOUS
  Filled 2022-02-03 (×8): qty 1

## 2022-02-03 MED ORDER — MAGNESIUM SULFATE 4 GM/100ML IV SOLN
4.0000 g | Freq: Once | INTRAVENOUS | Status: AC
Start: 2022-02-03 — End: 2022-02-03
  Administered 2022-02-03: 4 g via INTRAVENOUS
  Filled 2022-02-03: qty 100

## 2022-02-03 MED ORDER — TRACE MINERALS CU-MN-SE-ZN 300-55-60-3000 MCG/ML IV SOLN
INTRAVENOUS | Status: AC
  Filled 2022-02-03: qty 696.8

## 2022-02-03 MED ORDER — METOPROLOL SUCCINATE ER 50 MG PO TB24
50.0000 mg | ORAL_TABLET | Freq: Every day | ORAL | Status: DC
  Administered 2022-02-03 – 2022-02-10 (×7): 50 mg via ORAL
  Filled 2022-02-03 (×8): qty 1

## 2022-02-03 NOTE — Progress Notes (Signed)
Brief Nutrition Follow-up:  Pt did very well with SLP eval yesterday, diet advanced to [place] this AM.   Pt reports very poor appetite but denies any GI issues-no N/V/abd pain. Ostomy output reasonable per RN.   Pt drank coffee this AM and nothing else for breakfast. Lunch tray at bedside and pt has not eaten anything yet but reports she is not hungry. RN offered Boost Breeze this AM, which pt states she likes, she just did not want to drink it this AM. Pt reports she does not like EnsurePlus/Boost Plus.  Magic Cup on meal tray; discussed this with pt and pt seemed interested in this  Pt has been through a lot during this hospitalization and I expect if will take a while for her appetite to return.   Nutrition focused exam perform; pt with significant muscle wasting on exam in addition to some mild/moderate subcutaneous fat loss and sig edema on exam.   Recommend continuing nutrition support until po intake improves; pt is appropriate for Cortrak insertion if pt will agree to this. Otherwise, recommend continuing TPN at goal rate  Continue Boost Breeze po TID, each supplement provides 250 kcal and 9 grams of protein and Magic cup TID with meals, each supplement provides 290 kcal and 9 grams of protein   Kerman Passey MS, RDN, LDN, CNSC Registered Dietitian 3 Clinical Nutrition RD Pager and On-Call Pager Number Located in [place]

## 2022-02-03 NOTE — Progress Notes (Signed)
Patient in no distress at this time. BiPAP not needed at this time.

## 2022-02-03 NOTE — Progress Notes (Signed)
[facility] Progress Note  7 Days Post-Op  Subjective: Looks good today.  Tolerating some CLD, not drinking much but really enjoying the breeze.  Objective: Vital signs in last 24 hours: Temp:  [99 F (37.2 C)-100.8 F (38.2 C)] 99.1 F (37.3 C) (08/30 0746) Pulse Rate:  [92-121] 110 (08/30 0746) Resp:  [17-35] 28 (08/30 0746) BP: (101-157)/(54-104) 126/63 (08/30 0746) SpO2:  [94 %-100 %] 100 % (08/30 0746) Weight:  [73.2 kg] 73.2 kg (08/30 0500) Last BM Date : 02/02/22  Intake/Output from previous day: 08/29 0701 - 08/30 0700 In: 1875.9 [I.V.:1875.9] Out: 2425 [Urine:2085; Drains:40; Stool:300] Intake/Output this shift: No intake/output data recorded.  PE: Gen:  Alert Abd: Soft, appropriately tender, ostomy viable with thin bilious fluid in bag Msk: 2+ edema BUE/BLE  Lab Results:  Recent Labs    02/02/22 0615 02/03/22 0549  WBC 10.2 11.2*  HGB 9.7* 9.2*  HCT 31.6* 30.0*  PLT 233 232   BMET Recent Labs    02/02/22 1455 02/03/22 0549  NA 143 140  K 4.1 4.1  CL 99 97*  CO2 36* 36*  GLUCOSE 111* 107*  BUN 25* 26*  CREATININE 0.58 0.51  CALCIUM 8.9 9.0   PT/INR No results for input(s): "LABPROT", "INR" in the last 72 hours. CMP     Component Value Date/Time   NA 140 02/03/2022 0549   NA 139 11/18/2021 1432   NA 134 (L) 04/25/2017 1216   K 4.1 02/03/2022 0549   K 4.3 04/25/2017 1216   CL 97 (L) 02/03/2022 0549   CO2 36 (H) 02/03/2022 0549   CO2 29 04/25/2017 1216   GLUCOSE 107 (H) 02/03/2022 0549   GLUCOSE 85 04/25/2017 1216   BUN 26 (H) 02/03/2022 0549   BUN 10 11/18/2021 1432   BUN 14.4 04/25/2017 1216   CREATININE 0.51 02/03/2022 0549   CREATININE 0.72 01/17/2018 1347   CREATININE 0.7 04/25/2017 1216   CALCIUM 9.0 02/03/2022 0549   CALCIUM 10.0 04/25/2017 1216   PROT 5.9 (L) 02/03/2022 0549   PROT 7.2 04/25/2017 1216   ALBUMIN 2.0 (L) 02/03/2022 0549   ALBUMIN 3.7 04/25/2017 1216   AST 23 02/03/2022 0549   AST 20  01/17/2018 1347   AST 21 04/25/2017 1216   ALT 14 02/03/2022 0549   ALT 10 01/17/2018 1347   ALT 10 04/25/2017 1216   ALKPHOS 92 02/03/2022 0549   ALKPHOS 75 04/25/2017 1216   BILITOT 0.4 02/03/2022 0549   BILITOT 0.5 01/17/2018 1347   BILITOT 0.65 04/25/2017 1216   GFRNONAA >60 02/03/2022 0549   GFRNONAA >60 01/17/2018 1347   GFRAA 90 04/24/2020 1235   GFRAA >60 01/17/2018 1347   Lipase     Component Value Date/Time   LIPASE 77 07/28/2008 1630       Studies/Results: DG CHEST PORT 1 VIEW  Result Date: 02/01/2022 CLINICAL DATA:  Wheezing. EXAM: PORTABLE CHEST 1 VIEW COMPARISON:  02/01/2022. FINDINGS: The heart size and mediastinal contours are stable. There is elevation of the right diaphragm with stable patchy airspace disease in the right lung and at the left lung base. Small pleural effusions are noted bilaterally. No pneumothorax. A left-sided PICC line is stable in position. Surgical clips are present in the right upper quadrant. No acute osseous abnormality. IMPRESSION: 1. Elevation of the right diaphragm with slightly increased patchy airspace disease in the mid to lower right lung and left lung base. 2. Small bilateral pleural effusions. Electronically Signed   By: Mickel Baas  Lovena Le M.D.   On: 02/01/2022 21:14   DG CHEST PORT 1 VIEW  Result Date: 02/01/2022 CLINICAL DATA:  Post thoracentesis EXAM: PORTABLE CHEST 1 VIEW COMPARISON:  Earlier same day FINDINGS: Decreased right pleural effusion. No pneumothorax. Persistent probable patchy right lung atelectasis. Similar left pleural effusion and adjacent atelectasis. Stable cardiomediastinal contours. IMPRESSION: Decreased right pleural effusion and no pneumothorax post thoracentesis. Electronically Signed   By: Macy Mis M.D.   On: 02/01/2022 09:24    Anti-infectives: Anti-infectives (From admission, onward)    Start     Dose/Rate Route Frequency Ordered Stop   01/26/22 1400  piperacillin-tazobactam (ZOSYN) IVPB 3.375 g         3.375 g 12.5 mL/hr over 240 Minutes Intravenous Every 8 hours 01/26/22 0945 02/02/22 0150   01/24/2022 1030  vancomycin (VANCOREADY) IVPB 750 mg/150 mL  Status:  Discontinued        750 mg 150 mL/hr over 60 Minutes Intravenous Every 24 hours 01/26/2022 0937 01/28/22 1520   01/06/2022 1000  vancomycin (VANCOREADY) IVPB 1500 mg/300 mL  Status:  Discontinued        1,500 mg 150 mL/hr over 120 Minutes Intravenous Every 48 hours 01/23/22 0909 02/01/2022 0937   01/23/22 1030  vancomycin (VANCOREADY) IVPB 1250 mg/250 mL        1,250 mg 166.7 mL/hr over 90 Minutes Intravenous  Once 01/23/22 0907 01/23/22 1259   01/23/22 1000  metroNIDAZOLE (FLAGYL) IVPB 500 mg  Status:  Discontinued        500 mg 100 mL/hr over 60 Minutes Intravenous Every 12 hours 01/23/22 0839 01/26/22 0945   01/23/22 1000  ceFEPIme (MAXIPIME) 2 g in sodium chloride 0.9 % 100 mL IVPB  Status:  Discontinued        2 g 200 mL/hr over 30 Minutes Intravenous Every 12 hours 01/23/22 0901 01/26/22 0945   02/02/2022 1400  micafungin (MYCAMINE) 100 mg in sodium chloride 0.9 % 100 mL IVPB  Status:  Discontinued        100 mg 105 mL/hr over 1 Hours Intravenous Every 24 hours 01/08/2022 1302 01/15/22 1211   01/10/22 1515  piperacillin-tazobactam (ZOSYN) IVPB 3.375 g        3.375 g 12.5 mL/hr over 240 Minutes Intravenous Every 8 hours 01/10/22 1427 01/26/2022 2359   01/04/22 2000  vancomycin (VANCOCIN) IVPB 1000 mg/200 mL premix  Status:  Discontinued        1,000 mg 200 mL/hr over 60 Minutes Intravenous Every 24 hours 01/03/22 1910 01/05/22 1321   01/03/22 2200  ceFEPIme (MAXIPIME) 2 g in sodium chloride 0.9 % 100 mL IVPB        2 g 200 mL/hr over 30 Minutes Intravenous Every 12 hours 01/03/22 1910 01/08/22 2209   01/03/22 1930  vancomycin (VANCOREADY) IVPB 1250 mg/250 mL        1,250 mg 166.7 mL/hr over 90 Minutes Intravenous  Once 01/03/22 1910 01/03/22 2219   01/02/22 2100  azithromycin (ZITHROMAX) 500 mg in sodium chloride 0.9 % 250 mL IVPB         500 mg 250 mL/hr  Intravenous Every 24 hours 12/09/2021 2248 01/06/22 2149   01/02/22 2000  cefTRIAXone (ROCEPHIN) 2 g in sodium chloride 0.9 % 100 mL IVPB  Status:  Discontinued        2 g 200 mL/hr over 30 Minutes Intravenous Every 24 hours 12/12/2021 2248 01/03/22 1733   12/15/2021 2030  cefTRIAXone (ROCEPHIN) 1 g in sodium chloride 0.9 % 100 mL  IVPB        1 g 200 mL/hr over 30 Minutes Intravenous  Once 12/08/2021 2025 12/19/2021 2141   12/05/2021 2030  azithromycin (ZITHROMAX) 500 mg in sodium chloride 0.9 % 250 mL IVPB        500 mg 250 mL/hr over 60 Minutes Intravenous  Once 12/30/2021 2025 12/11/2021 2312        Assessment/Plan POD #23/21, S/p exploratory laparotomy,  R colectomy, abthera placement 02/03/2022 Dr. Rosendo Gros S/p exploratory laparotomy, intestinal anastomosis, abdominal closure 01/31/2022 Dr. Rosendo Gros  for ischemic bowel with perforation POD#7 S/p exploratory laparotomy, extensive LOA, resection distal small bowel and ileocolonic anastomosis, creation end ileostomy 8/23 Dr. Redmond Pulling - Ostomy with some thin bilious output. Continue TPN for now.  -passed FEES.  CLD yesterday, will advance to FLD today and continue with resource breeze TID.  Cont TNA for now until more oral intake, but hopefully can start to wean soon - WOC consult for new ileostomy and VAC - Vac MWF - continue zosyn x5 days postop (end date 8/28), WBC normal   FEN: FLD, breeze, TNA ID: Zosyn (stop date 8/14), cefepime/flagyl 8/19 >>8/21, Vanc/zosyn 8/21 --> 8/28 VTE: SCD's, hold DOAC/ heparin gtt Dispo: ok for floor from our standpoint   Below per TRH/CCM -- Septic shock due to above Acute hypoxic respiratory failure - per CCM, extubated on 8/26 Cardiac arrest 7/30, 7 mins CPR Hx cardiomyopathy COPD Afib, permanent CAP DNR    LOS: 72 days    Henreitta Cea, [facility] 02/03/2022, 8:23 AM Please see Amion for pager number during day hours 7:00am-4:30pm

## 2022-02-03 NOTE — Progress Notes (Signed)
NAME:  [name], MRN:  [id number], DOB:  1940/06/13, LOS: 26 ADMISSION DATE:  12/31/2021, CONSULTATION DATE:  01/17/2022 REFERRING MD:  Bonner Puna - TR[facility] CHIEF COMPLAINT:  Hypotension, abdominal pain   History of Present Illness:  81 year old woman who presented to [facility] 7/28 with CAP causing respiratory failure. Suffered cardiac arrest 7/30 and transferred to ICU, intubated. CT placed for parapneumonic effusion 7/31, removed 8/2 and extubated. Transferred to floor 8/5.   On 8/6, patient developed ischemic colitis (initially treated conservatively) however patient clinically declined and PCCM was reconsulted 8/7 for worsening shock, confusion. CCS consulted. Exploratory laparotomy performed by general surgery 8/7 for ischemic colitis with perforation s/p right colectomy and repeat laparotomy on 8/9 with intestinal anastomosis and abdominal closure. Patient was extubated 8/11 and transferred to the general medical floor.   CT A/P was completed 8/20 demonstrating R sided abdominal fluid collection with c/f anastomotic leak. Patient underwent IR CT-guided drain placement with feculent contents consistent with anastomotic leak/feculent peritonitis. Rapid response was called 8/21 in the setting of respiratory distress; patient was reintubated and transferred back to the ICU. Underwent OR takeback 8/23 with CCS with findings of anastomotic leak with dense adhesions requiring extensive LOA and SBR as well as end ileostomy creation.  PCCM following for vent/medical management.  Pertinent Medical History:  Breast cancer on letrozole Atrial fibrillation  Significant Hospital Events: Including procedures, antibiotic start and stop dates in addition to other pertinent events   7/28 admit, Ceftriaxone/Azithro 7/29 last dose xeralto  7/30 IHCA x 7 min, Ceftriaxone>Cefepime 7/31 Chest tube placed, heparin gtt started 8/1 patient remove chest tube.  Head CT obtained >negative.  Severely agitated overnight.   Vancomycin stopped.  Azithromycin stopped. 8/6 Transferred to [facility] 8/7 Called CCM back for abd, Distention , hypotension and a fib with RVR, plan is for  urgent OR pending goals of care discussion then ICU post op if husband is in agreement. 8/10 Abdomen closed yesterday, SBT/SAT today 8/11 Extubated yesterday, doing well on , up OOB, feeling alright 8/13 Transferred to medical floor 8/14 Chest ultrasound shows atelectasis without significant right basilar effusion. 8/20 CT A/P with fuid collection in R mid-abdomen with c/f anastomotic leak. 8/21 Back to ICU for resp distress 8/22 CT-guided drain placement (RLQ) for fluid assessment, abscess vs. Anastomotic leak 8/23 Taken back to OR for ex-lap/concern for anastomotic leak; extensive LOA with findings of feculent peritonitis with +leak; enterotomies requiring additional SBR and end ileostomy. 8/24 POD#1 from [place]. Unable to wean vent this AM. Following simple commands on Precedex 0.5, Levo 8. No output from new ileostomy. 8/25 POD#2 from [place]. Agitated overnight. Weaning on PSV 12/5. Awake, following commands. Levo 9mg. Scant serous output in ostomy bag.  Made DNR after palliative meeting 8/26 Extubated. Right thoracentesis with removal of 600 cc 8/28 respiratory distress, urgent thoracentesis removal of 600 cc, diuresis with Lasix, came off BiPAP  Interim History / Subjective:  Doing better after diuresis Tolerating clear liquid diet.  Objective:  Blood pressure 126/63, pulse (!) 110, temperature 99.1 F (37.3 C), resp. rate (!) 28, height 5' (1.524 m), weight 73.2 kg, SpO2 100 %.        Intake/Output Summary (Last 24 hours) at 02/03/2022 0827 Last data filed at 02/03/2022 0650 Gross per 24 hour  Intake 1769.84 ml  Output 2165 ml  Net -395.16 ml    Filed Weights   02/01/22 0500 02/02/22 0350 02/03/22 0500  Weight: 78.3 kg 74.1 kg 73.2 kg  No distress Lungs clear, no accessory muscle use +muscle  wasting, minimal edema Wound vac in place, minimal output Abd mildly TTP Ostomy small dark liquid stool  CBC/BMP stable NO CXR  Resolved Hospital Problem List:   Metabolic Acidosis Hypokalemia Hypomagnesemia Septic Shock- intrabdominal abscess and possible aspiration pneumonia with new dense RLL opacity. 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 8/22 CT-guided drain placement (RLQ) for fluid assessment, abscess vs. anastomotic leak. Taken back to OR 8/23 for ex-lap/concern for anastomotic leak; extensive LOA with findings of feculent peritonitis with +leak; enterotomies requiring additional SBR and end ileostomy. -Postoperative management per CCS -Wound dressing to abdominal wounds -Completed antibiotics -Surgery and wound care following.   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  She is currently DNR.  Palliative care following. Husband updated daily  Best Practice (right click and "Reselect all SmartList Selections" daily)   Diet/type: clears>>full DVT prophylaxis: lovenox GI prophylaxis:  PPI Lines: Central line: TPN Foley:  Yes, and it is still needed Code Status:  DNR Last date of multidisciplinary goals of care discussion [palliative following]   Candee Furbish, MD [facility] 02/03/22 8:27 AM  Please see [email or web address] for pager details.  From 7A-7P if no response, please call [phone] After hours, please call ELink [phone]

## 2022-02-03 NOTE — Progress Notes (Signed)
PHARMACY - TOTAL PARENTERAL NUTRITION CONSULT NOTE  Indication: Intolerance to enteral feeding / anastomotic leak  Patient Measurements: Height: 5' (152.4 cm) Weight: 73.2 kg (161 lb 6 oz) IBW/kg (Calculated) : 45.5 TPN AdjBW (KG): 53.7 Body mass index is 31.52 kg/m.  Assessment:  53 YOF admited for CAP causing respiratory failure, had a cardiac arrest after admission and was moved to the ICU.  Chest tube placed for parapneumonic effusion, extubated on 8/2. Sent out of ICU on 8/5. On 8/6 she developed ischemic colitis, initially treated conservatively but transferred back to ICU for worsening shock and confusion 8/7.  Now s/p ex lap, R colectomy for ischemic bowel with perforation. Pt was provided TPN from 8/9-8/15. Pt's diet was advanced and pt ate 25-50% of meals from 8/15-8/20. Diet was changed to CLD on 8/20 for IR placement of drain for anastomotic leak. However, drain was not placed because pt acutely decompensated and was intubated and admitted to ICU and made NPO on 8/21. IR placed drain on 8/22 and drainage was positive for enteric contents confirming anastomotic leak. Surgery plans to perform resection of anastomosis and possible diversion with ileostomy on 8/23. Pharmacy consulted to manage TPN.  Glucose / Insulin: A1c 4.9% - CBGs < 180.  Required 0 unit of SSI  Electrolytes: high-stable Na 146 (was hypoNa earlier in admission), K WNL at 4.1 (goal >=4), Mag down 1.7 (goal >/= 2 for ileus and Afib), CoCa 10.6, none in TPN (Ca x Phos = 39, goal < 55), others WNL  Renal: SCr < 1, BUN WNL  Hepatic: LFTs / tbili / TG WNL, albumin 2 Intake / Output; MIVF: UOP 1.2 ml/kg/hr, NG 41m, drain removed, stool 300 mL, LR 219mhr, net +3.3 L, Lasix IV BID scheduled.  GI Imaging: 8/6 KUB: Mild gaseous distension favors ileus  8/6 CT: concerning for iscehmic colitis; possible SBO and/or inflammatory ileus  8/7 KUB: diffuse gaseous small bowel and colonic distension 8/20 CT: extraluminal collection  of gas and fluid possibly representing an anastomotic leak, postop fluid collection, or abscess GI Surgeries / Procedures:  8/7 Ex lap, colon resection 8/9 Ex lap, intestinal anastomosis, wound closure 8/22 CT-guided anterior RLQ abscess drain placement 8/23 Ex lap with resection of distal small bowel and ileocolonic anastomosis, creation of end ileostomy, and lysis of adhesions   Central access: PICC placed 01/12/22 TPN start date:  01/26/2022-01/19/22, restarted 8/23  Nutritional Goals: RD Estimated Needs Total Energy Estimated Needs: 1700-1900 kcals Total Protein Estimated Needs: 95-115 g Total Fluid Estimated Needs: >/= 1.5 L  Current Nutrition:  Clear liquids and TPN   Plan:  Continue concentrated TPN at goal rate of 65 ml/hr to provide 104g AA, 234g CHO, 61g lipids, and 1821 kCal, meeting 100% of nutrition needs Electrolytes in TPN: continue Na 66m71mL, K increased to 666m3m on 8/26 (= 94mE65my), Ca removed 8/26, max Mg at 15mEq19m= ~3g/day), decreased Phos to 22mmol73mn 8/26, max CL for now  Add standard MVI and trace elements to TPN  D/C SSI/CBG checks Standard TPN labs on Mon/Thurs -follow-up labs in AM (scheduled Lasix) F/u toleration and diet advancement  Magnesium 4g IV x1 - replaced by primary team.   JessicaSloan LeiterD, BCPS, BCCCP Clinical Pharmacist Please refer to AMION [facility] 02/03/2022, 7:10 AM

## 2022-02-03 NOTE — Consult Note (Addendum)
[place] Nurse wound follow up Vac dressing changed.  Pt was premedicated before the procedure and tolerated without apparent discomfort. Midline post-op full thickness wound is red and moist.  Scant amt pink drainage in the cannister. Slowly decreasing in size and depth; 14X2.5X1.5cm. Applied one piece black foam to 128m cont suction.  Extra supplies left at the bedside; plan for next dressing change on Fri.   WOC Nurse ostomy follow up Stoma type/location: Ileostomy stoma is red and viable, slightly above skin level, 1 1/4 inches, located in close proximity to abd wound.  Cut edge of tape on wafer so pouch does not have to be changed with each Vac dressing change.  Peristomal assessment:  intact skin surrounding Output: 50cc liquid green stool Ostomy pouching: 1pc.  Applied barrier ring to attempt to maintain a seal, and one piece flexible pouch. Use supplies: barrier ring LKellie Simmering# [phone] Convex pouch LKellie Simmering# [id number] Pt is critically ill and not aware enough to understand or participate in teaching sessions regarding ostomy care.  No family in the room.  WLucanteam will begin teaching sessions when she is stable and out of ICU.  [place] in [facility] Dis[place] program: Not yet Thank-you,  DJulien GirtMSN, [place]

## 2022-02-04 DIAGNOSIS — J189 Pneumonia, unspecified organism: Secondary | ICD-10-CM | POA: Diagnosis not present

## 2022-02-04 DIAGNOSIS — E43 Unspecified severe protein-calorie malnutrition: Secondary | ICD-10-CM | POA: Insufficient documentation

## 2022-02-04 DIAGNOSIS — J9601 Acute respiratory failure with hypoxia: Secondary | ICD-10-CM | POA: Diagnosis not present

## 2022-02-04 DIAGNOSIS — I4811 Longstanding persistent atrial fibrillation: Secondary | ICD-10-CM | POA: Diagnosis not present

## 2022-02-04 DIAGNOSIS — I469 Cardiac arrest, cause unspecified: Secondary | ICD-10-CM | POA: Diagnosis not present

## 2022-02-04 LAB — CULTURE, BODY FLUID W GRAM STAIN -BOTTLE: Culture: NO GROWTH

## 2022-02-04 LAB — CBC
HCT: 29.6 % — ABNORMAL LOW (ref 36.0–46.0)
Hemoglobin: 9 g/dL — ABNORMAL LOW (ref 12.0–15.0)
MCH: 31.1 pg (ref 26.0–34.0)
MCHC: 30.4 g/dL (ref 30.0–36.0)
MCV: 102.4 fL — ABNORMAL HIGH (ref 80.0–100.0)
Platelets: 210 10*3/uL (ref 150–400)
RBC: 2.89 MIL/uL — ABNORMAL LOW (ref 3.87–5.11)
RDW: 18.5 % — ABNORMAL HIGH (ref 11.5–15.5)
WBC: 9 10*3/uL (ref 4.0–10.5)
nRBC: 0 % (ref 0.0–0.2)

## 2022-02-04 LAB — BASIC METABOLIC PANEL
Anion gap: 5 (ref 5–15)
BUN: 26 mg/dL — ABNORMAL HIGH (ref 8–23)
CO2: 39 mmol/L — ABNORMAL HIGH (ref 22–32)
Calcium: 9.2 mg/dL (ref 8.9–10.3)
Chloride: 96 mmol/L — ABNORMAL LOW (ref 98–111)
Creatinine, Ser: 0.45 mg/dL (ref 0.44–1.00)
GFR, Estimated: >60 mL/min (ref >60)
Glucose, Bld: 117 mg/dL — ABNORMAL HIGH (ref 70–99)
Potassium: 4.4 mmol/L (ref 3.5–5.1)
Sodium: 140 mmol/L (ref 135–145)

## 2022-02-04 LAB — TRIGLYCERIDES: Triglycerides: 139 mg/dL (ref <150)

## 2022-02-04 LAB — GLUCOSE, CAPILLARY
Glucose-Capillary: 97 mg/dL (ref 70–99)
Glucose-Capillary: 118 mg/dL — ABNORMAL HIGH (ref 70–99)
Glucose-Capillary: 104 mg/dL — ABNORMAL HIGH (ref 70–99)

## 2022-02-04 LAB — PHOSPHORUS: Phosphorus: 3.6 mg/dL (ref 2.5–4.6)

## 2022-02-04 LAB — HEPARIN LEVEL (UNFRACTIONATED): Heparin Unfractionated: <0.1 IU/mL — ABNORMAL LOW (ref 0.30–0.70)

## 2022-02-04 LAB — MAGNESIUM: Magnesium: 2.1 mg/dL (ref 1.7–2.4)

## 2022-02-04 MED ORDER — TRACE MINERALS CU-MN-SE-ZN 300-55-60-3000 MCG/ML IV SOLN
INTRAVENOUS | Status: AC
  Filled 2022-02-04: qty 696.8

## 2022-02-04 MED ORDER — OXYCODONE HCL 5 MG PO TABS
5.0000 mg | ORAL_TABLET | Freq: Four times a day (QID) | ORAL | Status: DC | PRN
  Administered 2022-02-04 – 2022-02-05 (×2): 5 mg via ORAL
  Administered 2022-02-05: 10 mg via ORAL
  Administered 2022-02-05 – 2022-02-07 (×3): 5 mg via ORAL
  Administered 2022-02-08 – 2022-02-09 (×2): 10 mg via ORAL
  Administered 2022-02-10 (×2): 5 mg via ORAL
  Filled 2022-02-04: qty 2
  Filled 2022-02-04 (×3): qty 1
  Filled 2022-02-04: qty 2
  Filled 2022-02-04 (×3): qty 1
  Filled 2022-02-04 (×2): qty 2

## 2022-02-04 MED ORDER — METHOCARBAMOL 500 MG PO TABS
750.0000 mg | ORAL_TABLET | Freq: Three times a day (TID) | ORAL | Status: DC
  Administered 2022-02-04 – 2022-02-05 (×5): 750 mg via ORAL
  Filled 2022-02-04 (×5): qty 2

## 2022-02-04 MED ORDER — ACETAMINOPHEN 500 MG PO TABS
1000.0000 mg | ORAL_TABLET | Freq: Four times a day (QID) | ORAL | Status: DC
  Administered 2022-02-04 – 2022-02-06 (×6): 1000 mg via ORAL
  Filled 2022-02-04 (×7): qty 2

## 2022-02-04 NOTE — Progress Notes (Signed)
[facility] Progress Note  8 Days Post-Op  Subjective: Having some more abdominal pain today, but not getting much in the way of pain meds.  We discussed prn oxy, scheduling some muscle relaxer and tylenol to help with pain control.  Her vitals are normal and her WBC is normal.  Not eating or drinking much currently.  Objective: Vital signs in last 24 hours: Temp:  [98.5 F (36.9 C)-100.2 F (37.9 C)] 98.5 F (36.9 C) (08/31 0749) Pulse Rate:  [89-117] 98 (08/31 0749) Resp:  [15-35] 21 (08/31 0749) BP: (100-130)/(55-76) 122/64 (08/31 0749) SpO2:  [90 %-100 %] 92 % (08/31 0749) Last BM Date : 01/26/22  Intake/Output from previous day: 08/30 0701 - 08/31 0700 In: 1639.9 [P.O.:200; I.V.:1439.9] Out: 2440 [Urine:2290; Stool:150] Intake/Output this shift: Total I/O In: 240 [P.O.:240] Out: -   PE: Gen:  Alert Abd: Soft, appropriately tender, but slightly more so on the left than right, ostomy viable with thin bilious fluid in bag Msk: edema BUE/BLE  Lab Results:  Recent Labs    02/03/22 0549 02/04/22 0611  WBC 11.2* 9.0  HGB 9.2* 9.0*  HCT 30.0* 29.6*  PLT 232 210   BMET Recent Labs    02/03/22 0549 02/04/22 0611  NA 140 140  K 4.1 4.4  CL 97* 96*  CO2 36* 39*  GLUCOSE 107* 117*  BUN 26* 26*  CREATININE 0.51 0.45  CALCIUM 9.0 9.2   PT/INR No results for input(s): "LABPROT", "INR" in the last 72 hours. CMP     Component Value Date/Time   NA 140 02/04/2022 0611   NA 139 11/18/2021 1432   NA 134 (L) 04/25/2017 1216   K 4.4 02/04/2022 0611   K 4.3 04/25/2017 1216   CL 96 (L) 02/04/2022 0611   CO2 39 (H) 02/04/2022 0611   CO2 29 04/25/2017 1216   GLUCOSE 117 (H) 02/04/2022 0611   GLUCOSE 85 04/25/2017 1216   BUN 26 (H) 02/04/2022 0611   BUN 10 11/18/2021 1432   BUN 14.4 04/25/2017 1216   CREATININE 0.45 02/04/2022 0611   CREATININE 0.72 01/17/2018 1347   CREATININE 0.7 04/25/2017 1216   CALCIUM 9.2 02/04/2022 0611   CALCIUM 10.0  04/25/2017 1216   PROT 5.9 (L) 02/03/2022 0549   PROT 7.2 04/25/2017 1216   ALBUMIN 2.0 (L) 02/03/2022 0549   ALBUMIN 3.7 04/25/2017 1216   AST 23 02/03/2022 0549   AST 20 01/17/2018 1347   AST 21 04/25/2017 1216   ALT 14 02/03/2022 0549   ALT 10 01/17/2018 1347   ALT 10 04/25/2017 1216   ALKPHOS 92 02/03/2022 0549   ALKPHOS 75 04/25/2017 1216   BILITOT 0.4 02/03/2022 0549   BILITOT 0.5 01/17/2018 1347   BILITOT 0.65 04/25/2017 1216   GFRNONAA >60 02/04/2022 0611   GFRNONAA >60 01/17/2018 1347   GFRAA 90 04/24/2020 1235   GFRAA >60 01/17/2018 1347   Lipase     Component Value Date/Time   LIPASE 77 07/28/2008 1630       Studies/Results: DG Chest Port 1 View  Result Date: 02/03/2022 CLINICAL DATA:  Shortness of breath, pleural effusion. EXAM: PORTABLE CHEST 1 VIEW COMPARISON:  Chest radiograph February 01, 2022. FINDINGS: Left upper extremity PICC with tip overlying the right atrium. EKG leads coiled over the chest and upper abdomen. Cardiac silhouette is partially obscured but appears enlarged, unchanged. Increased patchy airspace disease in the right lung base with similar airspace disease in the left lung base. Small right-greater-than-left pleural  effusions are similar prior. Right upper quadrant and right axillary surgical clips. No acute osseous abnormality. IMPRESSION: Increased patchy airspace disease in the right lung with similar airspace disease in the left lung base. No significant interval change in the small right-greater-than-left pleural effusions. Electronically Signed   By: Dahlia Bailiff M.D.   On: 02/03/2022 08:43    Anti-infectives: Anti-infectives (From admission, onward)    Start     Dose/Rate Route Frequency Ordered Stop   01/26/22 1400  piperacillin-tazobactam (ZOSYN) IVPB 3.375 g        3.375 g 12.5 mL/hr over 240 Minutes Intravenous Every 8 hours 01/26/22 0945 02/02/22 0150   01/17/2022 1030  vancomycin (VANCOREADY) IVPB 750 mg/150 mL  Status:   Discontinued        750 mg 150 mL/hr over 60 Minutes Intravenous Every 24 hours 01/12/2022 0937 01/28/22 1520   01/21/2022 1000  vancomycin (VANCOREADY) IVPB 1500 mg/300 mL  Status:  Discontinued        1,500 mg 150 mL/hr over 120 Minutes Intravenous Every 48 hours 01/23/22 0909 01/07/2022 0937   01/23/22 1030  vancomycin (VANCOREADY) IVPB 1250 mg/250 mL        1,250 mg 166.7 mL/hr over 90 Minutes Intravenous  Once 01/23/22 0907 01/23/22 1259   01/23/22 1000  metroNIDAZOLE (FLAGYL) IVPB 500 mg  Status:  Discontinued        500 mg 100 mL/hr over 60 Minutes Intravenous Every 12 hours 01/23/22 0839 01/26/22 0945   01/23/22 1000  ceFEPIme (MAXIPIME) 2 g in sodium chloride 0.9 % 100 mL IVPB  Status:  Discontinued        2 g 200 mL/hr over 30 Minutes Intravenous Every 12 hours 01/23/22 0901 01/26/22 0945   01/12/2022 1400  micafungin (MYCAMINE) 100 mg in sodium chloride 0.9 % 100 mL IVPB  Status:  Discontinued        100 mg 105 mL/hr over 1 Hours Intravenous Every 24 hours 01/12/2022 1302 01/15/22 1211   01/10/22 1515  piperacillin-tazobactam (ZOSYN) IVPB 3.375 g        3.375 g 12.5 mL/hr over 240 Minutes Intravenous Every 8 hours 01/10/22 1427 01/24/2022 2359   01/04/22 2000  vancomycin (VANCOCIN) IVPB 1000 mg/200 mL premix  Status:  Discontinued        1,000 mg 200 mL/hr over 60 Minutes Intravenous Every 24 hours 01/03/22 1910 01/05/22 1321   01/03/22 2200  ceFEPIme (MAXIPIME) 2 g in sodium chloride 0.9 % 100 mL IVPB        2 g 200 mL/hr over 30 Minutes Intravenous Every 12 hours 01/03/22 1910 01/08/22 2209   01/03/22 1930  vancomycin (VANCOREADY) IVPB 1250 mg/250 mL        1,250 mg 166.7 mL/hr over 90 Minutes Intravenous  Once 01/03/22 1910 01/03/22 2219   01/02/22 2100  azithromycin (ZITHROMAX) 500 mg in sodium chloride 0.9 % 250 mL IVPB        500 mg 250 mL/hr  Intravenous Every 24 hours 12/11/2021 2248 01/06/22 2149   01/02/22 2000  cefTRIAXone (ROCEPHIN) 2 g in sodium chloride 0.9 % 100 mL IVPB   Status:  Discontinued        2 g 200 mL/hr over 30 Minutes Intravenous Every 24 hours 12/27/2021 2248 01/03/22 1733   12/31/2021 2030  cefTRIAXone (ROCEPHIN) 1 g in sodium chloride 0.9 % 100 mL IVPB        1 g 200 mL/hr over 30 Minutes Intravenous  Once 12/15/2021 2025 12/08/2021 2141  12/07/2021 2030  azithromycin (ZITHROMAX) 500 mg in sodium chloride 0.9 % 250 mL IVPB        500 mg 250 mL/hr over 60 Minutes Intravenous  Once 01/04/2022 2025 12/31/2021 2312        Assessment/Plan POD #24/22, S/p exploratory laparotomy,  R colectomy, abthera placement 01/26/2022 Dr. Rosendo Gros S/p exploratory laparotomy, intestinal anastomosis, abdominal closure 01/28/2022 Dr. 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 Redmond Pulling - Ostomy with some thin bilious output. Continue TPN for now.  -FLD and continue with resource breeze TID.  Cont TNA for now until more oral intake, but hopefully can start to wean soon - WOC consult for new ileostomy and VAC - Vac MWF - continue zosyn x5 days postop (end date 8/28), WBC normal -having more pain today, but not taking pain meds really.  Discussed scheduling tylenol, robaxin, and prn oxy as her vitals are stable, AF, and WBC normal.  If her worsening pain persists despite changes in pain meds, will consider CT scan tomorrow   FEN: FLD, breeze, TNA ID: Zosyn (stop date 8/14), cefepime/flagyl 8/19 >>8/21, Vanc/zosyn 8/21 --> 8/28 VTE: SCD's, hold DOAC/ heparin gtt Dispo: floor   Below per TRH/CCM -- Septic shock due to above Acute hypoxic respiratory failure - per CCM, extubated on 8/26 Cardiac arrest 7/30, 7 mins CPR Hx cardiomyopathy COPD Afib, permanent CAP DNR    LOS: 58 days    [name], [facility] 02/04/2022, 10:58 AM Please see Amion for pager number during day hours 7:00am-4:30pm

## 2022-02-04 NOTE — Progress Notes (Signed)
Speech Language Pathology Treatment: Dysphagia  Patient Details Name: [name] MRN: [id number] DOB: 22-Aug-1940 Today's Date: 02/04/2022 Time: 5993-5701 SLP Time Calculation (min) (ACUTE ONLY): 18 min  Assessment / Plan / Recommendation Clinical Impression  Pt alert with husband at bedside. Pt c/o abdominal pain from surgery and was weary to change position for PO trials, but agreeable with use of reverse Trendelenburg. Pt given trials of pudding and thin liquids, and displayed no s/sx of aspiration. Pt and husband educated on precautions and positional changes to maximize safety. SLP will f/u for diet advancement as medically cleared by surgical team.   HPI HPI: 81 y.o. female admitted 12/31/2021 with CAP.  7/30 AMS, head CT negative for acute abnormality. Code blue 7/30, ROSC achieved after 7 min CPR, pt intubated. Chest tube insertion 7/31; pt removed CT 8/1. ETT 7/30-8/2. Continued AMS, hallucinations overnight 8/2; head CT 8/2 remains negative for acute abnormality. EEG 8/2 with no seizure activity. Improved cognition 8/5, but persistent tachycardia/afib with RVR. 8/6 ischemic colitis. 8/7 shock with intubation and to OR for ex lap. 8/9 abdomen closure. 8/10 extubation. 8/21 respiratory distress with reintubation. 8/23 return to OR for ex lap and small bowel resection with ileostomy. 8/26 extubation after consult with Palliative medicine; no plans for reintubation. Pt has been followed for swallowing since 8/3. Diet before last intubation was dysphagia 3/thin liquids. PMHx:Afib, HTN, breast CA, melanoma, asthma, bilateral TKA, anxiety.      SLP Plan  Continue with current plan of care      Recommendations for follow up therapy are one component of a multi-disciplinary discharge planning process, led by the attending physician.  Recommendations may be updated based on patient status, additional functional criteria and insurance authorization.    Recommendations  Diet recommendations:  Thin liquid (full liquid diet per surgery) Liquids provided via: Cup;Straw Medication Administration: Crushed with puree Supervision: Patient able to self feed Compensations: Slow rate;Small sips/bites Postural Changes and/or Swallow Maneuvers: Seated upright 90 degrees                Oral Care Recommendations: Oral care QID Follow Up Recommendations: No SLP follow up Assistance recommended at discharge: Frequent or constant Supervision/Assistance SLP Visit Diagnosis: Dysphagia, unspecified (R13.10) Plan: Continue with current plan of care          [facility] Student SLP   02/04/2022, 1:55 PM

## 2022-02-04 NOTE — Progress Notes (Signed)
PROGRESS NOTE        PATIENT DETAILS Name: [name] Age: 81 y.o. Sex: female Date of Birth: 05-05-1941 Admit Date: 12/27/2021 Admitting Physician Kayleen Memos, DO IOE:[id number], [facility] @ [place]  Brief Summary: Patient is a 81 y.o.  female with history of persistent atrial fibrillation, HTN, chronic HFpEF, breast cancer on letrozole, pulmonary hypertension, asthma-who presented to the hospital on 7/28 with cough/shortness of breath-she was found to have acute hypoxic respiratory failure due to right lobar PNA.  She was started on IV antibiotics and admitted to the hospitalist service.  On 7/30-she sustained a in-hospital cardiac arrest thought to be due to underlying respiratory decline from pneumonia, she was intubated and transferred to the ICU.  CT chest showed a large right-sided pleural effusion-she underwent chest tube placement.  She was stabilized and transferred back to [facility].  Unfortunately-further hospital course was complicated by development of acute abdomen in the setting of ischemic colon with perforation requiring multiple laparotomies and bowel resection.  She remained in the ICU for while she underwent numerous surgical procedures-once stabilized-she was extubated and transferred back to [facility] on 8/31.  See below for further details.  Significant events: 7/28>> admit to [facility]-sepsis with hypoxia due to PNA.  7/30>> PEA arrest.  Transfer to ICU 7/31>> Chest tube placed 8/01>> patient removed chest tube.   8/06>> Transfer to [facility] 8/07>> back to ICU-acute abdomen-hypertension-A-fib RVR.  Found to have ischemic colon with contained perforation-underwent exploratory laparotomy and colon resection. 8/09>> exploratory laparotomy/intestinal anastomosis and abdominal closure. 8/10>> Extubated 8/13 Transfer to [facility] 8/20>> CT A/P with fuid collection in R mid-abdomen with c/f anastomotic leak. 8/21>> Back to ICU for resp distress 8/22>>  CT-guided drain placement (RLQ) for fluid assessment, abscess vs. Anastomotic leak 8/23>> Taken back to OR for ex-lap/concern for anastomotic leak; extensive LOA with findings of feculent peritonitis with +leak; enterotomies requiring additional SBR and end ileostomy. 8/25>> Made DNR after palliative meeting 8/26>> Extubated. Right thoracentesis with removal of 600 cc 8/28>> respiratory distress, urgent thoracentesis removal of 600 cc, diuresis with Lasix, came off BiPAP 8/31>> transferred to [facility].  Significant studies: 7/29>> CT angio chest: No PE, large areas of consolidation with air bronchogram involving right lower/right middle lobe. 7/30>> CT head: No acute intracranial abnormality. 7/30>> CT chest/abdomen/pelvis: Worsening right pleural effusion, panchamber cardiomegaly, cirrhotic liver changes 7/31>> Echo: EF 25-30%, changes consistent with chronic Takotsubo cardiomyopathy.  RV Eduardo Osier function is mildly reduced. 8/02>> CT head: No acute intracranial process 8/02>> Spot EEG: No seizures. 8/06>> CT abdomen pelvis: Interval development of pneumatosis involving cecum/ascending colon-concerning for ischemic colitis.  Associated small bowel obstruction. 8/14>> Limited echo: EF 60-65%.  RV systolic function moderately reduced. 8/17>> left upper extremity Doppler: No DVT. 8/20>> CT abdomen/pelvis: Surgical changes of right hemicolectomy-extraluminal collection of gas/fluid in the right mid abdomen.  Extensive infiltrative changes within the mesentery of the right abdomen.   7/31>> Echo: EF 25-30%, changes consistent with chronic Takotsubo cardiomyopathy.  RV Eduardo Osier function is mildly reduced.  Significant microbiology data: 7/28>> blood culture: No growth 7/29>> urine culture: No growth 7/30>> BAL culture: No growth 7/31>> pleural fluid culture: No growth 8/18>> blood culture: No growth 8/19>> urine culture: Enterococcus faecalis 8/21>> tracheal aspirate: No growth 8/21>> blood culture:  No growth 8/22>> abscess abdomen: Enterococcus/Klebsiella 8/26>> pleural fluid culture: No growth  Consults: PCCM, cardiology, palliative  care, general surgery, interventional radiology, neurology  Subjective: Lying comfortably in bed-denies any chest pain or shortness of breath.  Objective: Vitals: Blood pressure 122/64, pulse 98, temperature 98.5 F (36.9 C), temperature source Oral, resp. rate (!) 21, height 5' (1.524 m), weight 73.2 kg, SpO2 92 %.   Exam: Gen Exam:Alert awake-not in any distress HEENT:atraumatic, normocephalic Chest: B/L clear to auscultation anteriorly CVS:S1S2 regular Abdomen:soft non tender, non distended Extremities:no edema Neurology: Non focal Skin: no rash  Pertinent Labs/Radiology:    Latest Ref Rng & Units 02/04/2022    6:11 AM 02/03/2022    5:49 AM 02/02/2022    6:15 AM  CBC  WBC 4.0 - 10.5 K/uL 9.0  11.2  10.2   Hemoglobin 12.0 - 15.0 g/dL 9.0  9.2  9.7   Hematocrit 36.0 - 46.0 % 29.6  30.0  31.6   Platelets 150 - 400 K/uL 210  232  233     Lab Results  Component Value Date   NA 140 02/04/2022   K 4.4 02/04/2022   CL 96 (L) 02/04/2022   CO2 39 (H) 02/04/2022      Assessment/Plan: Acute hypoxic respiratory failure Right lobar PNA Parapneumonic effusion-s/p chest tube placement-removed 8/1 On minimal amount of O2 this morning-2-3 L-no longer on any further antibiotics-doing well-continue to encourage mobilization/incentive spirometry.  Continue supportive care.  Ischemic bowel with perforation-s/p right colectomy and exploratory laparotomy on 8/7. 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  No longer on antibiotics-General surgery following and  directing care.  Septic shock with bowel perforation and intra-abdominal abscess:  Sepsis physiology has resolved-completed a course of IV antibiotics.  S/p CT-guided drain placement by IR on 8/22.  PEA arrest Felt to be due to respiratory issues-continue telemetry monitoring.  Takotsubo cardiomyopathy with recovered EF Acute on chronic HFpEF Volume overload due to third spacing/hypoalbuminemia Continue telemetry monitoring-on beta-blocker.  Volume overloaded due to third spacing from hypoalbuminemia-remains on IV Lasix.  Persistent atrial fibrillation:  On beta-blocker-anticoagulated with Lovenox-once not thought to require any surgical procedures-we will switch to oral agent.  History of moderate pulmonary hypertension Will need close outpatient follow-up with pulmonology and cardiology.  HTN BP stable on metoprolol.  History of bronchial asthma: Not in exacerbation-continue bronchodilators.  Acute metabolic encephalopathy Due to septic shock/peritonitis/hypoxemia.  Resolved-awake and alert this morning.  EEG negative for seizures.  Normocytic anemia Due to critical illness-transfuse if significant drop.  No evidence of blood loss.  History of breast cancer-s/p right mastectomy on 01/24/2017-completed adjuvant radiation November 2018 resume letrozole when able.  Severe debility/deconditioning PT/OT Probably SNF on discharge.  Nutrition Status: Nutrition Problem: Severe Malnutrition Etiology: acute illness Signs/Symptoms: mild fat depletion, mild muscle depletion, moderate muscle depletion, severe fat depletion Interventions: TPN, Boost Breeze, Refer to RD note for recommendations   Pressure Ulcer: Pressure Injury 01/21/2022 Nose Left;Right Stage 2 -  Partial thickness loss of dermis presenting as a shallow open injury with a red, pink wound bed without slough. raw and pink area with scabs very painful (Active)  01/05/2022 1147  Location: Nose  Location Orientation:  Left;Right  Staging: Stage 2 -  Partial thickness loss of dermis presenting as a shallow open injury with a red, pink wound bed without slough.  Wound Description (Comments): raw and pink area with scabs very  painful  Present on Admission: No  Dressing Type None 02/03/22 2000   Obesity: Estimated body mass index is 31.52 kg/m as calculated from the following:   Height as of this encounter: 5' (1.524 m).   Weight as of this encounter: 73.2 kg.   Code status:   Code Status: DNR   DVT Prophylaxis: Place and maintain sequential compression device Start: 01/29/2022 1701 SCDs Start: 01/30/2022 2878 therapeutic Lovenox  Family Communication: None at bedside   Disposition Plan: Status is: Inpatient Remains inpatient appropriate because: Severity of illness-diet being advanced-on IV diuretics for volume overload.   Planned Discharge Destination:Skilled nursing facility   Diet: Diet Order             Diet full liquid Room service appropriate? Yes; Fluid consistency: Thin  Diet effective now                     Antimicrobial agents: Anti-infectives (From admission, onward)    Start     Dose/Rate Route Frequency Ordered Stop   01/26/22 1400  piperacillin-tazobactam (ZOSYN) IVPB 3.375 g        3.375 g 12.5 mL/hr over 240 Minutes Intravenous Every 8 hours 01/26/22 0945 02/02/22 0150   01/29/2022 1030  vancomycin (VANCOREADY) IVPB 750 mg/150 mL  Status:  Discontinued        750 mg 150 mL/hr over 60 Minutes Intravenous Every 24 hours 01/08/2022 0937 01/28/22 1520   01/28/2022 1000  vancomycin (VANCOREADY) IVPB 1500 mg/300 mL  Status:  Discontinued        1,500 mg 150 mL/hr over 120 Minutes Intravenous Every 48 hours 01/23/22 0909 01/29/2022 0937   01/23/22 1030  vancomycin (VANCOREADY) IVPB 1250 mg/250 mL        1,250 mg 166.7 mL/hr over 90 Minutes Intravenous  Once 01/23/22 0907 01/23/22 1259   01/23/22 1000  metroNIDAZOLE (FLAGYL) IVPB 500 mg  Status:  Discontinued        500  mg 100 mL/hr over 60 Minutes Intravenous Every 12 hours 01/23/22 0839 01/26/22 0945   01/23/22 1000  ceFEPIme (MAXIPIME) 2 g in sodium chloride 0.9 % 100 mL IVPB  Status:  Discontinued        2 g 200 mL/hr over 30 Minutes Intravenous Every 12 hours 01/23/22 0901 01/26/22 0945   01/09/2022 1400  micafungin (MYCAMINE) 100 mg in sodium chloride 0.9 % 100 mL IVPB  Status:  Discontinued        100 mg 105 mL/hr over 1 Hours Intravenous Every 24 hours 01/09/2022 1302 01/15/22 1211   01/10/22 1515  piperacillin-tazobactam (ZOSYN) IVPB 3.375 g        3.375 g 12.5 mL/hr over 240 Minutes Intravenous Every 8 hours 01/10/22 1427 01/29/2022 2359   01/04/22 2000  vancomycin (VANCOCIN) IVPB 1000 mg/200 mL premix  Status:  Discontinued        1,000 mg 200 mL/hr over 60 Minutes Intravenous Every 24 hours 01/03/22 1910 01/05/22 1321   01/03/22 2200  ceFEPIme (MAXIPIME) 2 g in sodium chloride 0.9 % 100 mL IVPB        2 g 200 mL/hr over 30 Minutes Intravenous Every 12 hours 01/03/22 1910 01/08/22 2209   01/03/22 1930  vancomycin (VANCOREADY) IVPB 1250 mg/250 mL        1,250 mg 166.7 mL/hr over 90 Minutes Intravenous  Once 01/03/22 1910 01/03/22 2219   01/02/22 2100  azithromycin (ZITHROMAX) 500 mg in sodium chloride 0.9 % 250 mL IVPB  500 mg 250 mL/hr  Intravenous Every 24 hours 12/18/2021 2248 01/06/22 2149   01/02/22 2000  cefTRIAXone (ROCEPHIN) 2 g in sodium chloride 0.9 % 100 mL IVPB  Status:  Discontinued        2 g 200 mL/hr over 30 Minutes Intravenous Every 24 hours 12/10/2021 2248 01/03/22 1733   12/17/2021 2030  cefTRIAXone (ROCEPHIN) 1 g in sodium chloride 0.9 % 100 mL IVPB        1 g 200 mL/hr over 30 Minutes Intravenous  Once 01/02/2022 2025 12/19/2021 2141   12/14/2021 2030  azithromycin (ZITHROMAX) 500 mg in sodium chloride 0.9 % 250 mL IVPB        500 mg 250 mL/hr over 60 Minutes Intravenous  Once 12/29/2021 2025 12/31/2021 2312        MEDICATIONS: Scheduled Meds:  arformoterol  15 mcg Nebulization  BID   bacitracin   Topical BID   budesonide (PULMICORT) nebulizer solution  0.25 mg Nebulization BID   enoxaparin (LOVENOX) injection  100 mg Subcutaneous Q24H   feeding supplement  1 Container Oral TID BM   furosemide  40 mg Intravenous Q12H   insulin aspart  0-9 Units Subcutaneous Q4H   lidocaine  10 mL Other Once   metoprolol succinate  50 mg Oral Daily   mupirocin ointment   Nasal BID   pantoprazole (PROTONIX) IV  40 mg Intravenous Q24H   revefenacin  175 mcg Nebulization Daily   Continuous Infusions:  sodium chloride 10 mL/hr at 02/03/22 1900   sodium chloride Stopped (01/30/22 2126)   TPN ADULT (ION) 65 mL/hr at 02/03/22 1900   PRN Meds:.acetaminophen, acetaminophen, acetaminophen, fentaNYL (SUBLIMAZE) injection, fentaNYL (SUBLIMAZE) injection, guaiFENesin, haloperidol lactate, levalbuterol, lip balm, ondansetron (ZOFRAN) IV, oxyCODONE, sodium chloride, sodium chloride, sodium chloride flush, white petrolatum   I have personally reviewed following labs and imaging studies  LABORATORY DATA: CBC: Recent Labs  Lab 01/31/22 0411 02/01/22 0407 02/02/22 0615 02/03/22 0549 02/04/22 0611  WBC 9.4 9.6 10.2 11.2* 9.0  HGB 10.0* 10.0* 9.7* 9.2* 9.0*  HCT 32.4* 33.3* 31.6* 30.0* 29.6*  MCV 100.3* 103.1* 99.7 100.7* 102.4*  PLT 255 255 233 232 694    Basic Metabolic Panel: Recent Labs  Lab 01/29/22 0322 01/30/22 0449 01/31/22 0411 02/01/22 0407 02/02/22 0929 02/02/22 1455 02/03/22 0549 02/04/22 0611  NA 146* 146* 146* 146* 140 143 140 140  K 4.3 3.6 4.3 4.0 6.1* 4.1 4.1 4.4  CL 117* 113* 110 105 97* 99 97* 96*  CO2 24 29 32 36* 38* 36* 36* 39*  GLUCOSE 121* 126* 106* 118* 499* 111* 107* 117*  BUN _0 25* 25* 26* 26*  CREATININE 0.68 0.70 0.64 0.51 0.54 0.58 0.51 0.45  CALCIUM 8.9 9.3 9.2 8.8* 8.5* 8.9 9.0 9.2  MG 1.9 1.8 1.8 2.0 2.5*  --  1.7 2.1  PHOS 3.2 3.5  --  3.8  --   --   --  3.6    GFR: Estimated Creatinine Clearance: 51.8 mL/min (by C-G  formula based on SCr of 0.45 mg/dL).  Liver Function Tests: Recent Labs  Lab 02/01/22 0407 02/03/22 0549  AST 19 23  ALT 19 14  ALKPHOS 78 92  BILITOT 0.8 0.4  PROT 5.6* 5.9*  ALBUMIN 2.0* 2.0*   No results for input(s): "LIPASE", "AMYLASE" in the last 168 hours. No results for input(s): "AMMONIA" in the last 168 hours.  Coagulation Profile: No results for input(s): "INR", "PROTIME" in the last 168 hours.  Cardiac Enzymes: No results for input(s): "CKTOTAL", "CKMB", "CKMBINDEX", "TROPONINI" in the last 168 hours.  BNP (last 3 results) No results for input(s): "PROBNP" in the last 8760 hours.  Lipid Profile: Recent Labs    02/04/22 0611  TRIG 139    Thyroid Function Tests: No results for input(s): "TSH", "T4TOTAL", "FREET4", "T3FREE", "THYROIDAB" in the last 72 hours.  Anemia Panel: No results for input(s): "VITAMINB12", "FOLATE", "FERRITIN", "TIBC", "IRON", "RETICCTPCT" in the last 72 hours.  Urine analysis:    Component Value Date/Time   COLORURINE YELLOW 01/23/2022 1135   APPEARANCEUR CLEAR 01/23/2022 1135   LABSPEC <1.005 (L) 01/23/2022 1135   PHURINE 7.0 01/23/2022 1135   GLUCOSEU NEGATIVE 01/23/2022 1135   HGBUR TRACE (A) 01/23/2022 1135   BILIRUBINUR NEGATIVE 01/23/2022 1135   KETONESUR NEGATIVE 01/23/2022 1135   PROTEINUR NEGATIVE 01/23/2022 1135   UROBILINOGEN 0.2 01/22/2013 1345   NITRITE NEGATIVE 01/23/2022 1135   LEUKOCYTESUR LARGE (A) 01/23/2022 1135    Sepsis Labs: Lactic Acid, Venous    Component Value Date/Time   LATICACIDVEN 1.0 01/26/2022 0440    MICROBIOLOGY: Recent Results (from the past 240 hour(s))  MRSA Next Gen by PCR, Nasal     Status: None   Collection Time: 01/15/2022  4:24 PM   Specimen: Nasal Mucosa; Nasal Swab  Result Value Ref Range Status   MRSA by PCR Next Gen NOT DETECTED NOT DETECTED Final    Comment: (NOTE) The GeneXpert MRSA Assay (FDA approved for NASAL specimens only), is one component of a comprehensive MRSA  colonization surveillance program. It is not intended to diagnose MRSA infection nor to guide or monitor treatment for MRSA infections. Test performance is not FDA approved in patients less than 63 years old. Performed at [facility], [place] [address], [place], [place] [address]   Culture, Respiratory w Gram Stain     Status: None   Collection Time: 01/28/2022  5:05 PM   Specimen: Tracheal Aspirate; Respiratory  Result Value Ref Range Status   Specimen Description TRACHEAL ASPIRATE  Final   Special Requests Normal  Final   Gram Stain   Final    MODERATE WBC PRESENT,BOTH PMN AND MONONUCLEAR NO ORGANISMS SEEN    Culture   Final    RARE Normal respiratory flora-no Staph aureus or Pseudomonas seen Performed at [facility], [address] [place], [place] [address]    Report Status 01/05/2022 FINAL  Final  Culture, blood (Routine X 2) w Reflex to ID Panel     Status: None   Collection Time: 01/05/2022  5:17 PM   Specimen: BLOOD  Result Value Ref Range Status   Specimen Description BLOOD RIGHT ANTECUBITAL  Final   Special Requests   Final    BOTTLES DRAWN AEROBIC AND ANAEROBIC Blood Culture adequate volume   Culture   Final    NO GROWTH 5 DAYS Performed at [facility], [place] [address] [place], [place] [address]    Report Status 01/30/2022 FINAL  Final  Culture, blood (Routine X 2) w Reflex to ID Panel     Status: None   Collection Time: 01/08/2022  5:18 PM   Specimen: BLOOD RIGHT HAND  Result Value Ref Range Status   Specimen Description BLOOD RIGHT HAND  Final   Special Requests   Final    BOTTLES DRAWN AEROBIC AND ANAEROBIC Blood Culture adequate volume   Culture   Final    NO GROWTH 5 DAYS Performed at [facility], [address], [place], [place] [address]    Report Status 01/30/2022 FINAL  Final  Aerobic/Anaerobic Culture w Gram Stain (surgical/deep wound)     Status: None   Collection Time: 01/26/22  3:09 PM   Specimen: Abscess  Result Value Ref  Range Status   Specimen Description ABSCESS ABDOMEN  Final   Special Requests NONE  Final   Gram Stain   Final    ABUNDANT WBC PRESENT, PREDOMINANTLY PMN ABUNDANT GRAM POSITIVE COCCI ABUNDANT GRAM NEGATIVE RODS FEW GRAM POSITIVE RODS Performed at [facility], [address], [place], [place] [address]    Culture   Final    ABUNDANT STAPHYLOCOCCUS HAEMOLYTICUS ABUNDANT ENTEROCOCCUS FAECALIS ABUNDANT KLEBSIELLA OXYTOCA ABUNDANT BACTEROIDES THETAIOTAOMICRON ABUNDANT BACTEROIDES UNIFORMIS BETA LACTAMASE POSITIVE CALL MICROBIOLOGY LAB IF SENSITIVITIES ARE REQUIRED. STAPHYLOCOCCUS HAEMOLYTICUS    Report Status 01/29/2022 FINAL  Final   Organism ID, Bacteria ENTEROCOCCUS FAECALIS  Final   Organism ID, Bacteria KLEBSIELLA OXYTOCA  Final      Susceptibility   Enterococcus faecalis - MIC*    AMPICILLIN <=2 SENSITIVE Sensitive     VANCOMYCIN 1 SENSITIVE Sensitive     GENTAMICIN SYNERGY SENSITIVE Sensitive     * ABUNDANT ENTEROCOCCUS FAECALIS   Klebsiella oxytoca - MIC*    AMPICILLIN >=32 RESISTANT Resistant     CEFAZOLIN 8 SENSITIVE Sensitive     CEFEPIME <=0.12 SENSITIVE Sensitive     CEFTAZIDIME <=1 SENSITIVE Sensitive     CEFTRIAXONE <=0.25 SENSITIVE Sensitive     CIPROFLOXACIN <=0.25 SENSITIVE Sensitive     GENTAMICIN <=1 SENSITIVE Sensitive     IMIPENEM <=0.25 SENSITIVE Sensitive     TRIMETH/SULFA <=20 SENSITIVE Sensitive     AMPICILLIN/SULBACTAM 16 INTERMEDIATE Intermediate     PIP/TAZO 16 SENSITIVE Sensitive     * ABUNDANT KLEBSIELLA OXYTOCA  Culture, body fluid w Gram Stain-bottle     Status: None   Collection Time: 01/30/22 10:04 AM   Specimen: Pleura  Result Value Ref Range Status   Specimen Description PLEURAL  Final   Special Requests NONE  Final   Culture   Final    NO GROWTH 5 DAYS Performed at [facility], [address] [place], [place] [address]    Report Status 02/04/2022 FINAL  Final  Gram stain     Status: None   Collection Time: 01/30/22  10:04 AM   Specimen: Pleura  Result Value Ref Range Status   Specimen Description PLEURAL  Final   Special Requests NONE  Final   Gram Stain   Final    RARE WBC PRESENT, PREDOMINANTLY MONONUCLEAR NO ORGANISMS SEEN Performed at [facility], [place] [address], [place], [place] [address]    Report Status 01/30/2022 FINAL  Final    RADIOLOGY STUDIES/RESULTS: DG Chest Port 1 View  Result Date: 02/03/2022 CLINICAL DATA:  Shortness of breath, pleural effusion. EXAM: PORTABLE CHEST 1 VIEW COMPARISON:  Chest radiograph February 01, 2022. FINDINGS: Left upper extremity PICC with tip overlying the right atrium. EKG leads coiled over the chest and upper abdomen. Cardiac silhouette is partially obscured but appears enlarged, unchanged. Increased patchy airspace disease in the right lung base with similar airspace disease in the left lung base. Small right-greater-than-left pleural effusions are similar prior. Right upper quadrant and right axillary surgical clips. No acute osseous abnormality. IMPRESSION: Increased patchy airspace disease in the right lung with similar airspace disease in the left lung base. No significant interval change in the small right-greater-than-left pleural  effusions. Electronically Signed   By: Dahlia Bailiff M.D.   On: 02/03/2022 08:43     LOS: 34 days   Oren Binet, MD  [facility]    To contact the attending provider between 7A-7P or the covering provider during after hours 7P-7A, please log into the web site [email or web address] and access using universal [place] password for that web site. If you do not have the password, please call the hospital operator.  02/04/2022, 10:12 AM

## 2022-02-04 NOTE — Progress Notes (Signed)
PHARMACY - TOTAL PARENTERAL NUTRITION CONSULT NOTE  Indication: Intolerance to enteral feeding / anastomotic leak  Patient Measurements: Height: 5' (152.4 cm) Weight: 73.2 kg (161 lb 6 oz) IBW/kg (Calculated) : 45.5 TPN AdjBW (KG): 53.7 Body mass index is 31.52 kg/m.  Assessment:  40 YOF admited for CAP causing respiratory failure, had a cardiac arrest after admission and was moved to the ICU.  Chest tube placed for parapneumonic effusion, extubated on 8/2. Sent out of ICU on 8/5. On 8/6 she developed ischemic colitis, initially treated conservatively but transferred back to ICU for worsening shock and confusion 8/7.  Now s/p ex lap, R colectomy for ischemic bowel with perforation. Pt was provided TPN from 8/9-8/15. Pt's diet was advanced and pt ate 25-50% of meals from 8/15-8/20. Diet was changed to CLD on 8/20 for IR placement of drain for anastomotic leak. However, drain was not placed because pt acutely decompensated and was intubated and admitted to ICU and made NPO on 8/21. IR placed drain on 8/22 and drainage was positive for enteric contents confirming anastomotic leak. Pharmacy consulted to manage TPN.  Spoke with patient to discuss oral intake since advancing diet. She reports minimal intake due to abdominal pain and also feeling full.  Glucose / Insulin: A1c 4.9% - CBGs < 140.  Required 0 unit of SSI  Electrolytes: Na 140 - stable (was hypoNa earlier in admission), CoCa 10.8, none in TPN (Ca x Phos = 39, goal < 55), others WNL  Goal K >/ 4 and Mg >/2 for ileus and Afib Renal: SCr < 1, BUN 26 (increasing) Hepatic: LFTs / tbili / TG WNL, albumin 2 Intake / Output; MIVF: UOP 1.3 ml/kg/hr, NG 1m, stool 150 mL, net +3.6 L, Lasix 40 IV BID scheduled.  GI Imaging: 8/6 KUB: Mild gaseous distension favors ileus  8/6 CT: concerning for iscehmic colitis; possible SBO and/or inflammatory ileus  8/7 KUB: diffuse gaseous small bowel and colonic distension 8/20 CT: extraluminal collection of  gas and fluid possibly representing an anastomotic leak, postop fluid collection, or abscess GI Surgeries / Procedures:  8/7 Ex lap, colon resection 8/9 Ex lap, intestinal anastomosis, wound closure 8/22 CT-guided anterior RLQ abscess drain placement 8/23 Ex lap with resection of distal small bowel and ileocolonic anastomosis, creation of end ileostomy, and lysis of adhesions   Central access: PICC placed 01/12/22 TPN start date:  01/15/2022-01/19/22, restarted 8/23  Nutritional Goals: RD Estimated Needs Total Energy Estimated Needs: 1700-1900 kcals Total Protein Estimated Needs: 95-115 g Total Fluid Estimated Needs: >/= 1.5 L  Current Nutrition:  TPN  8/30 FLD  Plan:  Continue concentrated TPN at goal rate of 65 ml/hr to provide 104g AA, 234g CHO, 61g lipids, and 1821 kCal, meeting 100% of nutrition needs Electrolytes in TPN: continue Na 04m/L, K 6034mL, Ca removed 8/26, max Mg at 48m65m, Phos 22mm53m, max CL for now  Add standard MVI and trace elements to TPN  D/C SSI, CBG checks to Q8H to monitor for hypoglycemia Standard TPN labs on Mon/Thurs Daily BMP and Mg while on Lasix scheduled F/u toleration and diet advancement    LorinErskine SpeedrmD Clinical Pharmacist Please refer to [facility] 02/04/2022, 8:57 AM

## 2022-02-04 NOTE — Progress Notes (Signed)
Physical Therapy Treatment Patient Details Name: [name] MRN: [id number] DOB: 09/05/1940 Today's Date: 02/04/2022   History of Present Illness 81 y.o. female admitted 12/20/2021 with CAP.  7/30 AMS, head CT negative for acute abnormality. Code blue 7/30, ROSC achieved after 7 min CPR, pt intubated. Chest tube insertion 7/31; pt removed CT 8/1. ETT 7/30-8/2. Continued AMS, hallucinations overnight 8/2; head CT 8/2 remains negative for acute abnormality. EEG 8/2 with no seizure activity. Improved cognition 8/5, but persistent tachycardia/afib with RVR. 8/6 ischemic colitis. 8/7 shock with intubation and to OR for ex lap. 8/9 abdomen closure. 8/10 extubation. 8/21 respiratory distress with reintubation. 8/23 return to OR for ex lap and small bowel resection with ileostomy. Extubated 8/26 and thoracentesis. 8/28 respiratory distress with urgent thoracentesis. PMHx:Afib, HTN, breast CA, melanoma, asthma, bilateral TKA, anxiety.    PT Comments    Pt initially receptive to bed mobility but after initiating leg movement she refuses. Pt requires max encouragement from PT and son to participate in therapeutic exercise. PT provides education on the need for functional mobility and exercise in an effort to improve strength and reduce caregiver burden. PT will continue to follow.  Recommendations for follow up therapy are one component of a multi-disciplinary discharge planning process, led by the attending physician.  Recommendations may be updated based on patient status, additional functional criteria and insurance authorization.  Follow Up Recommendations  PT at Long-term acute care hospital Can patient physically be transported by private vehicle: No   Assistance Recommended at Discharge Frequent or constant Supervision/Assistance  Patient can return home with the following Direct supervision/assist for medications management;Assistance with feeding;Assistance with cooking/housework;Direct  supervision/assist for financial management;Assist for transportation;A lot of help with walking and/or transfers;A lot of help with bathing/dressing/bathroom   Equipment Recommendations  Hospital bed;Wheelchair (measurements PT);Other (comment) (hoyer lift)    Recommendations for Other Services       Precautions / Restrictions Precautions Precautions: Fall;Other (comment) Precaution Comments: ileostomy, PICC line, wound vac Restrictions Weight Bearing Restrictions: No     Mobility  Bed Mobility Overal bed mobility: Needs Assistance Bed Mobility: Supine to Sit           General bed mobility comments: pt initiates supine to sit with leg movement to edge of bed, then refuses further bed mobility. Assisted into bed in chair position for therex    Transfers                        Ambulation/Gait                   Stairs             Wheelchair Mobility    Modified Rankin (Stroke Patients Only)       Balance                                            Cognition Arousal/Alertness: Awake/alert Behavior During Therapy: Flat affect (reports a fear of falling) Overall Cognitive Status: Impaired/Different from baseline Area of Impairment: Problem solving, Awareness, Orientation                 Orientation Level: Disoriented to, Time         Awareness: Intellectual Problem Solving: Slow processing, Decreased initiation          Exercises General Exercises - Lower Extremity  Ankle Circles/Pumps: AROM, Both, 10 reps Gluteal Sets: AROM, Both, 5 reps Short Arc Quad: AROM, Both, 10 reps Heel Slides: AAROM, Both, 5 reps    General Comments General comments (skin integrity, edema, etc.): VSS on 3L [place]      Pertinent Vitals/Pain Pain Assessment Pain Assessment: Faces Faces Pain Scale: Hurts even more Pain Location: abdomen, knees Pain Descriptors / Indicators: Grimacing Pain Intervention(s): Monitored during  session    Home Living                          Prior Function            PT Goals (current goals can now be found in the care plan section) Acute Rehab PT Goals Patient Stated Goal: return home Progress towards PT goals: Not progressing toward goals - comment (refusing bed mobility)    Frequency    Min 3X/week      PT Plan Current plan remains appropriate    Co-evaluation              AM-PAC PT "6 Clicks" Mobility   Outcome Measure  Help needed turning from your back to your side while in a flat bed without using bedrails?: A Lot Help needed moving from lying on your back to sitting on the side of a flat bed without using bedrails?: Total Help needed moving to and from a bed to a chair (including a wheelchair)?: Total Help needed standing up from a chair using your arms (e.g., wheelchair or bedside chair)?: Total Help needed to walk in hospital room?: Total Help needed climbing 3-5 steps with a railing? : Total 6 Click Score: 7    End of Session Equipment Utilized During Treatment: Oxygen Activity Tolerance: Patient limited by fatigue (self-limiting, refusing bed mobility) Patient left: in bed;with call bell/phone within reach;with bed alarm set;with family/visitor present Nurse Communication: Mobility status;Need for lift equipment PT Visit Diagnosis: Other abnormalities of gait and mobility (R26.89);Difficulty in walking, not elsewhere classified (R26.2);Muscle weakness (generalized) (M62.81)     Time: 5852-7782 PT Time Calculation (min) (ACUTE ONLY): 31 min  Charges:  $Therapeutic Exercise: 8-22 mins $Therapeutic Activity: 8-22 mins                     Zenaida Niece, PT, DPT Acute Rehabilitation Office [place] 02/04/2022, 5:21 PM

## 2022-02-05 DIAGNOSIS — J189 Pneumonia, unspecified organism: Secondary | ICD-10-CM | POA: Diagnosis not present

## 2022-02-05 DIAGNOSIS — E43 Unspecified severe protein-calorie malnutrition: Secondary | ICD-10-CM | POA: Diagnosis not present

## 2022-02-05 DIAGNOSIS — J9601 Acute respiratory failure with hypoxia: Secondary | ICD-10-CM | POA: Diagnosis not present

## 2022-02-05 DIAGNOSIS — I469 Cardiac arrest, cause unspecified: Secondary | ICD-10-CM | POA: Diagnosis not present

## 2022-02-05 DIAGNOSIS — K559 Vascular disorder of intestine, unspecified: Secondary | ICD-10-CM | POA: Diagnosis not present

## 2022-02-05 DIAGNOSIS — I4811 Longstanding persistent atrial fibrillation: Secondary | ICD-10-CM | POA: Diagnosis not present

## 2022-02-05 LAB — BASIC METABOLIC PANEL
Anion gap: 6 (ref 5–15)
BUN: 29 mg/dL — ABNORMAL HIGH (ref 8–23)
CO2: 39 mmol/L — ABNORMAL HIGH (ref 22–32)
Calcium: 9.3 mg/dL (ref 8.9–10.3)
Chloride: 91 mmol/L — ABNORMAL LOW (ref 98–111)
Creatinine, Ser: 0.49 mg/dL (ref 0.44–1.00)
GFR, Estimated: >60 mL/min (ref >60)
Glucose, Bld: 124 mg/dL — ABNORMAL HIGH (ref 70–99)
Potassium: 4.1 mmol/L (ref 3.5–5.1)
Sodium: 136 mmol/L (ref 135–145)

## 2022-02-05 LAB — CBC WITH DIFFERENTIAL/PLATELET
Abs Immature Granulocytes: 0.15 10*3/uL — ABNORMAL HIGH (ref 0.00–0.07)
Basophils Absolute: 0.1 10*3/uL (ref 0.0–0.1)
Basophils Relative: 1 %
Eosinophils Absolute: 0.2 10*3/uL (ref 0.0–0.5)
Eosinophils Relative: 2 %
HCT: 28.5 % — ABNORMAL LOW (ref 36.0–46.0)
Hemoglobin: 8.9 g/dL — ABNORMAL LOW (ref 12.0–15.0)
Immature Granulocytes: 2 %
Lymphocytes Relative: 15 %
Lymphs Abs: 1.3 10*3/uL (ref 0.7–4.0)
MCH: 31.2 pg (ref 26.0–34.0)
MCHC: 31.2 g/dL (ref 30.0–36.0)
MCV: 100 fL (ref 80.0–100.0)
Monocytes Absolute: 1.3 10*3/uL — ABNORMAL HIGH (ref 0.1–1.0)
Monocytes Relative: 15 %
Neutro Abs: 6 10*3/uL (ref 1.7–7.7)
Neutrophils Relative %: 65 %
Platelets: 223 10*3/uL (ref 150–400)
RBC: 2.85 MIL/uL — ABNORMAL LOW (ref 3.87–5.11)
RDW: 18.2 % — ABNORMAL HIGH (ref 11.5–15.5)
WBC: 9 10*3/uL (ref 4.0–10.5)
nRBC: 0 % (ref 0.0–0.2)

## 2022-02-05 LAB — GLUCOSE, CAPILLARY
Glucose-Capillary: 109 mg/dL — ABNORMAL HIGH (ref 70–99)
Glucose-Capillary: 122 mg/dL — ABNORMAL HIGH (ref 70–99)
Glucose-Capillary: 125 mg/dL — ABNORMAL HIGH (ref 70–99)
Glucose-Capillary: 139 mg/dL — ABNORMAL HIGH (ref 70–99)

## 2022-02-05 LAB — MAGNESIUM: Magnesium: 1.9 mg/dL (ref 1.7–2.4)

## 2022-02-05 MED ORDER — TRACE MINERALS CU-MN-SE-ZN 300-55-60-3000 MCG/ML IV SOLN
INTRAVENOUS | Status: AC
  Filled 2022-02-05: qty 696.8

## 2022-02-05 MED ORDER — MIRTAZAPINE 15 MG PO TABS
7.5000 mg | ORAL_TABLET | Freq: Every day | ORAL | Status: DC
  Administered 2022-02-05 – 2022-02-10 (×5): 7.5 mg via ORAL
  Filled 2022-02-05 (×5): qty 1

## 2022-02-05 NOTE — Progress Notes (Signed)
Palliative Medicine Progress Note   Patient Name: [name]       Date: 02/05/2022 DOB: [birth year]  Age: 81 y.o. MRN#: [id number] Attending Physician: Jonetta Osgood, MD Primary Care Physician: [facility] [place] Admit Date: 12/09/2021  Reason for Consultation/Follow-up: {Reason for Consult:23484}  HPI/Patient Profile: 81 y.o. female  with past medical history of breast cancer, atrial fibrillation, and anemia who was admitted admitted on 12/06/2021 with community-acquired pneumonia and acute respiratory failure.  On 7/30 she went into cardiac arrest and ROSC was achieved after 7 minutes.  Chest tube was placed for parapneumonic effusion, and she was extubated on 8/2.  Sent out of ICU on 8/5.  She underwent exploratory laparotomy on 8/7 for ischemic colitis with perforation and is s/p right colectomy.  She underwent repeat laparotomy on 8/9 with intestinal anastomosis and abdominal closure.  She was extubated 8/10 and transferred to the floor.  CT abdomen 8/20 showed right-sided abdominal fluid collection in which IR was consulted for drainage.  Rapid response was called 8/21 for respiratory distress in which she was intubated and transferred back to ICU. Palliative medicine was consulted 8/21 for goals of care in the setting of prolonged hospital stay, multiple complications, and complex medical history.   8/23 - Feculent material in percutaneous drain and imaging consistent with anastomotic leak. Patient taken back to OR for resection of anastomosis and creation of end ileostomy 8/26 - extubated  Subjective: Chart reviewed.   I went to see patient - she is sitting up in the recliner. She is sleeping but awakens easily to voice.   Objective:  Physical Exam Vitals  reviewed.  Constitutional:      General: She is sleeping. She is not in acute distress.    Appearance: She is ill-appearing.  Pulmonary:     Effort: Pulmonary effort is normal.  Neurological:     Mental Status: She is oriented to person, place, and time and easily aroused.     Motor: Weakness present.             Vital Signs: BP (!) 175/62 (BP Location: Left Leg)   Pulse 98   Temp 98.3 F (36.8 C) (Oral)   Resp (!) 24   Ht 5' (1.524 m)   Wt 73.7 kg   SpO2 98%  BMI 31.73 kg/m  SpO2: SpO2: 98 % O2 Device: O2 Device: Nasal Cannula O2 Flow Rate: O2 Flow Rate (L/min): 3 L/min  Intake/output summary:  Intake/Output Summary (Last 24 hours) at 02/05/2022 1743 Last data filed at 02/05/2022 1300 Gross per 24 hour  Intake 1375.29 ml  Output 2075 ml  Net -699.71 ml    LBM: Last BM Date : 01/26/22     Palliative Assessment/Data: ***     Palliative Medicine Assessment & Plan   Assessment: Principal Problem:   CAP (community acquired pneumonia) Active Problems:   Hypertensive heart disease without CHF   Hypotension   A-fib (HCC)   Lactic acid increased   Cardiac arrest ([place])   Acute respiratory failure with hypoxia (H[facility])   Pneumonia of right lower lobe due to infectious organism   Takotsubo cardiomyopathy   Ischemic colitis ([place])   Septic shock ([place])   Pressure injury of skin   Protein-calorie malnutrition, severe    Recommendations/Plan: DNR/DNI as previously documented Continue current full scope interventions Goals are for improvement  Ongoing palliative support    Goals of Care and Additional Recommendations: Limitations on Scope of Treatment: {Recommended Scope and Preferences:21019}  Code Status:   Prognosis:  {Palliative Care Prognosis:23504}  Discharge Planning: {Palliative dispostion:23505}  Care plan was discussed with ***  Thank you for allowing the Palliative Medicine Team to assist in the care of this patient.   ***   Lavena Bullion, NP   Please contact Palliative Medicine Team phone at [phone] for questions and concerns.  For individual providers, please see [facility].

## 2022-02-05 NOTE — Progress Notes (Signed)
PROGRESS NOTE        PATIENT DETAILS Name: [name] Age: 81 y.o. Sex: female Date of Birth: 1940-11-03 Admit Date: 12/21/2021 Admitting Physician Kayleen Memos, DO ZDG:[id number], [place] [facility] [place]  Brief Summary: Patient is a 81 y.o.  female with history of persistent atrial fibrillation, HTN, chronic HFpEF, breast cancer on letrozole, pulmonary hypertension, asthma-who presented to the hospital on 7/28 with cough/shortness of breath-she was found to have acute hypoxic respiratory failure due to right lobar PNA.  She was started on IV antibiotics and admitted to the hospitalist service.  On 7/30-she sustained a in-hospital cardiac arrest thought to be due to underlying respiratory decline from pneumonia, she was intubated and transferred to the ICU.  CT chest showed a large right-sided pleural effusion-she underwent chest tube placement.  She was stabilized and transferred back to [facility].  Unfortunately-further hospital course was complicated by development of acute abdomen in the setting of ischemic colon with perforation requiring multiple laparotomies and bowel resection.  She remained in the ICU for while she underwent numerous surgical procedures-once stabilized-she was extubated and transferred back to [facility] on 8/31.  See below for further details.  Significant events: 7/28>> admit to [facility]-sepsis with hypoxia due to PNA.  7/30>> PEA arrest.  Transfer to ICU.  CT chest with worsening pleural effusion. 7/31>> Chest tube placed 8/01>> patient removed chest tube.   8/06>> Transfer to [facility] 8/07>> back to ICU-acute abdomen-hypertension-A-fib RVR.  Found to have ischemic colon with contained perforation-underwent exploratory laparotomy and colon resection. 8/09>> exploratory laparotomy/intestinal anastomosis and abdominal closure. 8/10>> Extubated 8/13 Transfer to [facility] 8/20>> CT A/P with fuid collection in R mid-abdomen with c/f anastomotic leak. 8/21>>  Back to ICU for resp distress 8/22>> CT-guided drain placement (RLQ) for fluid assessment, abscess vs. Anastomotic leak 8/23>> Taken back to OR for ex-lap/concern for anastomotic leak; extensive LOA with findings of feculent peritonitis with +leak; enterotomies requiring additional SBR and end ileostomy. 8/25>> Made DNR after palliative meeting 8/26>> Extubated. Right thoracentesis with removal of 600 cc 8/28>> respiratory distress, urgent thoracentesis removal of 600 cc, diuresis with Lasix, came off BiPAP 8/31>> transferred to [facility].  Significant studies: 7/29>> CT angio chest: No PE, large areas of consolidation with air bronchogram involving right lower/right middle lobe. 7/30>> CT head: No acute intracranial abnormality. 7/30>> CT chest/abdomen/pelvis: Worsening right pleural effusion, panchamber cardiomegaly, cirrhotic liver changes 7/31>> Echo: EF 25-30%, changes consistent with chronic Takotsubo cardiomyopathy.  RV Eduardo Osier function is mildly reduced. 8/02>> CT head: No acute intracranial process 8/02>> Spot EEG: No seizures. 8/06>> CT abdomen pelvis: Interval development of pneumatosis involving cecum/ascending colon-concerning for ischemic colitis.  Associated small bowel obstruction. 8/14>> Limited echo: EF 60-65%.  RV systolic function moderately reduced. 8/17>> left upper extremity Doppler: No DVT. 8/20>> CT abdomen/pelvis: Surgical changes of right hemicolectomy-extraluminal collection of gas/fluid in the right mid abdomen.  Extensive infiltrative changes within the mesentery of the right abdomen.     Significant microbiology data: 7/28>> blood culture: No growth 7/29>> urine culture: No growth 7/30>> BAL culture: No growth 7/31>> pleural fluid culture: No growth 8/18>> blood culture: No growth 8/19>> urine culture: Enterococcus faecalis 8/21>> tracheal aspirate: No growth 8/21>> blood culture: No growth 8/22>> abscess abdomen: Enterococcus/Klebsiella 8/26>> pleural fluid  culture: No growth  Consults: PCCM, cardiology, palliative care, general surgery, interventional radiology, neurology  Subjective: No  major issues-lying comfortably in bed.  Objective: Vitals: Blood pressure (!) 122/54, pulse 97, temperature (!) 97.4 F (36.3 C), temperature source Oral, resp. rate 18, height 5' (1.524 m), weight 73.7 kg, SpO2 93 %.   Exam: Gen Exam:Alert awake-not in any distress HEENT:atraumatic, normocephalic Chest: B/L clear to auscultation anteriorly CVS:S1S2 regular Abdomen:soft non tender, non distended Extremities:no edema Neurology: Non focal Skin: no rash   Pertinent Labs/Radiology:    Latest Ref Rng & Units 02/05/2022    9:40 AM 02/04/2022    6:11 AM 02/03/2022    5:49 AM  CBC  WBC 4.0 - 10.5 K/uL 9.0  9.0  11.2   Hemoglobin 12.0 - 15.0 g/dL 8.9  9.0  9.2   Hematocrit 36.0 - 46.0 % 28.5  29.6  30.0   Platelets 150 - 400 K/uL 223  210  232     Lab Results  Component Value Date   NA 136 02/05/2022   K 4.1 02/05/2022   CL 91 (L) 02/05/2022   CO2 39 (H) 02/05/2022      Assessment/Plan: Acute hypoxic respiratory failure Right lobar PNA Parapneumonic effusion-s/p chest tube placement-removed 8/1 No major issues overnight-on 2 L of oxygen-continue to mobilize/encourage incentive spirometry.  No longer on antibiotics.    Ischemic bowel with perforation-s/p right colectomy and exploratory laparotomy on 8/7. 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  Claims she does not have much of an appetite-we will start Remeron.  Septic shock with bowel perforation and intra-abdominal abscess:  Sepsis physiology has resolved-completed a course of IV antibiotics.  S/p  CT-guided drain placement by IR on 8/22.  PEA arrest Felt to be due to respiratory issues-continue telemetry monitoring.  Initial echo showed changes consistent with Takotsubo cardiomyopathy-thankfully a limited echo few days later showed normal EF.  Takotsubo cardiomyopathy with recovered EF Acute on chronic HFpEF Volume overload due to third spacing/hypoalbuminemia Remains volume overloaded due to hypoalbuminemia-on IV Lasix.  Continue beta-blocker.  Follow closely.   Persistent atrial fibrillation:  On beta-blocker-anticoagulated with Lovenox-once not thought to require any surgical procedures-we will switch to oral agent.  History of moderate pulmonary hypertension Will need close outpatient follow-up with pulmonology and cardiology.  HTN BP stable on metoprolol.  History of bronchial asthma: Not in exacerbation-continue bronchodilators.  Acute metabolic encephalopathy Due to septic shock/peritonitis/hypoxemia.  Resolved-awake and alert this morning.  EEG negative for seizures.  Normocytic anemia Due to critical illness-transfuse if significant drop.  No evidence of blood loss.  History of breast cancer-s/p right mastectomy on 01/24/2017-completed adjuvant radiation November 2018 resume letrozole when able.  Severe debility/deconditioning PT/OT Probably SNF on discharge.  Nutrition Status: Nutrition Problem: Severe Malnutrition Etiology: acute illness Signs/Symptoms: mild fat depletion, mild muscle depletion, moderate muscle depletion, severe fat depletion Interventions: TPN, Boost Breeze, Refer to RD note for recommendations   Pressure Ulcer: Pressure Injury 02/04/2022 Nose Left;Right Stage 2 -  Partial thickness loss of dermis presenting as a shallow open injury with a red, pink wound bed without slough. raw and pink area with scabs very painful (Active)  01/19/2022 1147  Location: Nose  Location Orientation: Left;Right  Staging: Stage 2 -  Partial thickness loss of  dermis presenting as a shallow open injury with a red, pink wound bed without slough.  Wound Description (Comments):  raw and pink area with scabs very painful  Present on Admission: No  Dressing Type None 02/04/22 1950   Obesity: Estimated body mass index is 31.73 kg/m as calculated from the following:   Height as of this encounter: 5' (1.524 m).   Weight as of this encounter: 73.7 kg.   Code status:   Code Status: DNR   DVT Prophylaxis: Place and maintain sequential compression device Start: 01/30/2022 1701 SCDs Start: 01/08/2022 9371 therapeutic Lovenox  Family Communication: Son-Todd-[phone]-updated over the phone 9/1   Disposition Plan: Status is: Inpatient Remains inpatient appropriate because: Severity of illness-diet being advanced-on IV diuretics for volume overload.   Planned Discharge Destination:Skilled nursing facility versus LTAC depending on clinical process.   Diet: Diet Order             DIET SOFT Room service appropriate? Yes; Fluid consistency: Thin  Diet effective now                     Antimicrobial agents: Anti-infectives (From admission, onward)    Start     Dose/Rate Route Frequency Ordered Stop   01/26/22 1400  piperacillin-tazobactam (ZOSYN) IVPB 3.375 g        3.375 g 12.5 mL/hr over 240 Minutes Intravenous Every 8 hours 01/26/22 0945 02/02/22 0150   02/02/2022 1030  vancomycin (VANCOREADY) IVPB 750 mg/150 mL  Status:  Discontinued        750 mg 150 mL/hr over 60 Minutes Intravenous Every 24 hours 01/22/2022 0937 01/28/22 1520   01/17/2022 1000  vancomycin (VANCOREADY) IVPB 1500 mg/300 mL  Status:  Discontinued        1,500 mg 150 mL/hr over 120 Minutes Intravenous Every 48 hours 01/23/22 0909 01/31/2022 0937   01/23/22 1030  vancomycin (VANCOREADY) IVPB 1250 mg/250 mL        1,250 mg 166.7 mL/hr over 90 Minutes Intravenous  Once 01/23/22 0907 01/23/22 1259   01/23/22 1000  metroNIDAZOLE (FLAGYL) IVPB 500 mg  Status:  Discontinued         500 mg 100 mL/hr over 60 Minutes Intravenous Every 12 hours 01/23/22 0839 01/26/22 0945   01/23/22 1000  ceFEPIme (MAXIPIME) 2 g in sodium chloride 0.9 % 100 mL IVPB  Status:  Discontinued        2 g 200 mL/hr over 30 Minutes Intravenous Every 12 hours 01/23/22 0901 01/26/22 0945   02/03/2022 1400  micafungin (MYCAMINE) 100 mg in sodium chloride 0.9 % 100 mL IVPB  Status:  Discontinued        100 mg 105 mL/hr over 1 Hours Intravenous Every 24 hours 01/17/2022 1302 01/15/22 1211   01/10/22 1515  piperacillin-tazobactam (ZOSYN) IVPB 3.375 g        3.375 g 12.5 mL/hr over 240 Minutes Intravenous Every 8 hours 01/10/22 1427 01/21/2022 2359   01/04/22 2000  vancomycin (VANCOCIN) IVPB 1000 mg/200 mL premix  Status:  Discontinued        1,000 mg 200 mL/hr over 60 Minutes Intravenous Every 24 hours 01/03/22 1910 01/05/22 1321   01/03/22 2200  ceFEPIme (MAXIPIME) 2 g in sodium chloride 0.9 % 100 mL IVPB        2 g 200 mL/hr over 30 Minutes Intravenous Every 12 hours 01/03/22 1910 01/08/22 2209   01/03/22 1930  vancomycin (VANCOREADY) IVPB 1250 mg/250 mL        1,250 mg 166.7 mL/hr over 90 Minutes Intravenous  Once 01/03/22 1910 01/03/22 2219   01/02/22 2100  azithromycin (  ZITHROMAX) 500 mg in sodium chloride 0.9 % 250 mL IVPB        500 mg 250 mL/hr  Intravenous Every 24 hours 12/23/2021 2248 01/06/22 2149   01/02/22 2000  cefTRIAXone (ROCEPHIN) 2 g in sodium chloride 0.9 % 100 mL IVPB  Status:  Discontinued        2 g 200 mL/hr over 30 Minutes Intravenous Every 24 hours 12/24/2021 2248 01/03/22 1733   12/28/2021 2030  cefTRIAXone (ROCEPHIN) 1 g in sodium chloride 0.9 % 100 mL IVPB        1 g 200 mL/hr over 30 Minutes Intravenous  Once 12/23/2021 2025 12/29/2021 2141   12/26/2021 2030  azithromycin (ZITHROMAX) 500 mg in sodium chloride 0.9 % 250 mL IVPB        500 mg 250 mL/hr over 60 Minutes Intravenous  Once 12/13/2021 2025 12/17/2021 2312        MEDICATIONS: Scheduled Meds:  acetaminophen  1,000 mg Oral  Q6H   arformoterol  15 mcg Nebulization BID   budesonide (PULMICORT) nebulizer solution  0.25 mg Nebulization BID   enoxaparin (LOVENOX) injection  100 mg Subcutaneous Q24H   feeding supplement  1 Container Oral TID BM   furosemide  40 mg Intravenous Q12H   lidocaine  10 mL Other Once   methocarbamol  750 mg Oral TID   metoprolol succinate  50 mg Oral Daily   mupirocin ointment   Nasal BID   pantoprazole (PROTONIX) IV  40 mg Intravenous Q24H   revefenacin  175 mcg Nebulization Daily   Continuous Infusions:  sodium chloride 10 mL/hr at 02/03/22 1900   sodium chloride Stopped (01/30/22 2126)   TPN ADULT (ION) 65 mL/hr at 02/05/22 0154   TPN ADULT (ION)     PRN Meds:.guaiFENesin, haloperidol lactate, levalbuterol, lip balm, ondansetron (ZOFRAN) IV, oxyCODONE, sodium chloride, sodium chloride, sodium chloride flush, white petrolatum   I have personally reviewed following labs and imaging studies  LABORATORY DATA: CBC: Recent Labs  Lab 02/01/22 0407 02/02/22 0615 02/03/22 0549 02/04/22 0611 02/05/22 0940  WBC 9.6 10.2 11.2* 9.0 9.0  NEUTROABS  --   --   --   --  6.0  HGB 10.0* 9.7* 9.2* 9.0* 8.9*  HCT 33.3* 31.6* 30.0* 29.6* 28.5*  MCV 103.1* 99.7 100.7* 102.4* 100.0  PLT 255 233 232 210 223     Basic Metabolic Panel: Recent Labs  Lab 01/30/22 0449 01/31/22 0411 02/01/22 0407 02/02/22 0929 02/02/22 1455 02/03/22 0549 02/04/22 0611 02/05/22 0650  NA 146*   < > 146* 140 143 140 140 136  K 3.6   < > 4.0 6.1* 4.1 4.1 4.4 4.1  CL 113*   < > 105 97* 99 97* 96* 91*  CO2 29   < > 36* 38* 36* 36* 39* 39*  GLUCOSE 126*   < > 118* 499* 111* 107* 117* 124*  BUN 23   < > 22 25* 25* 26* 26* 29*  CREATININE 0.70   < > 0.51 0.54 0.58 0.51 0.45 0.49  CALCIUM 9.3   < > 8.8* 8.5* 8.9 9.0 9.2 9.3  MG 1.8   < > 2.0 2.5*  --  1.7 2.1 1.9  PHOS 3.5  --  3.8  --   --   --  3.6  --    < > = values in this interval not displayed.     GFR: Estimated Creatinine Clearance: 52 mL/min  (by C-G formula based on SCr of 0.49 mg/dL).  Liver Function Tests: Recent Labs  Lab 02/01/22 0407 02/03/22 0549  AST 19 23  ALT 19 14  ALKPHOS 78 92  BILITOT 0.8 0.4  PROT 5.6* 5.9*  ALBUMIN 2.0* 2.0*    No results for input(s): "LIPASE", "AMYLASE" in the last 168 hours. No results for input(s): "AMMONIA" in the last 168 hours.  Coagulation Profile: No results for input(s): "INR", "PROTIME" in the last 168 hours.  Cardiac Enzymes: No results for input(s): "CKTOTAL", "CKMB", "CKMBINDEX", "TROPONINI" in the last 168 hours.  BNP (last 3 results) No results for input(s): "PROBNP" in the last 8760 hours.  Lipid Profile: Recent Labs    02/04/22 0611  TRIG 139     Thyroid Function Tests: No results for input(s): "TSH", "T4TOTAL", "FREET4", "T3FREE", "THYROIDAB" in the last 72 hours.  Anemia Panel: No results for input(s): "VITAMINB12", "FOLATE", "FERRITIN", "TIBC", "IRON", "RETICCTPCT" in the last 72 hours.  Urine analysis:    Component Value Date/Time   COLORURINE YELLOW 01/23/2022 1135   APPEARANCEUR CLEAR 01/23/2022 1135   LABSPEC <1.005 (L) 01/23/2022 1135   PHURINE 7.0 01/23/2022 1135   GLUCOSEU NEGATIVE 01/23/2022 1135   HGBUR TRACE (A) 01/23/2022 1135   BILIRUBINUR NEGATIVE 01/23/2022 1135   KETONESUR NEGATIVE 01/23/2022 1135   PROTEINUR NEGATIVE 01/23/2022 1135   UROBILINOGEN 0.2 01/22/2013 1345   NITRITE NEGATIVE 01/23/2022 1135   LEUKOCYTESUR LARGE (A) 01/23/2022 1135    Sepsis Labs: Lactic Acid, Venous    Component Value Date/Time   LATICACIDVEN 1.0 01/26/2022 0440    MICROBIOLOGY: Recent Results (from the past 240 hour(s))  Aerobic/Anaerobic Culture w Gram Stain (surgical/deep wound)     Status: None   Collection Time: 01/26/22  3:09 PM   Specimen: Abscess  Result Value Ref Range Status   Specimen Description ABSCESS ABDOMEN  Final   Special Requests NONE  Final   Gram Stain   Final    ABUNDANT WBC PRESENT, PREDOMINANTLY PMN ABUNDANT  GRAM POSITIVE COCCI ABUNDANT GRAM NEGATIVE RODS FEW GRAM POSITIVE RODS Performed at [facility], [place] [address] [place], [place] [address]    Culture   Final    ABUNDANT STAPHYLOCOCCUS HAEMOLYTICUS ABUNDANT ENTEROCOCCUS FAECALIS ABUNDANT KLEBSIELLA OXYTOCA ABUNDANT BACTEROIDES THETAIOTAOMICRON ABUNDANT BACTEROIDES UNIFORMIS BETA LACTAMASE POSITIVE CALL MICROBIOLOGY LAB IF SENSITIVITIES ARE REQUIRED. STAPHYLOCOCCUS HAEMOLYTICUS    Report Status 01/29/2022 FINAL  Final   Organism ID, Bacteria ENTEROCOCCUS FAECALIS  Final   Organism ID, Bacteria KLEBSIELLA OXYTOCA  Final      Susceptibility   Enterococcus faecalis - MIC*    AMPICILLIN <=2 SENSITIVE Sensitive     VANCOMYCIN 1 SENSITIVE Sensitive     GENTAMICIN SYNERGY SENSITIVE Sensitive     * ABUNDANT ENTEROCOCCUS FAECALIS   Klebsiella oxytoca - MIC*    AMPICILLIN >=32 RESISTANT Resistant     CEFAZOLIN 8 SENSITIVE Sensitive     CEFEPIME <=0.12 SENSITIVE Sensitive     CEFTAZIDIME <=1 SENSITIVE Sensitive     CEFTRIAXONE <=0.25 SENSITIVE Sensitive     CIPROFLOXACIN <=0.25 SENSITIVE Sensitive     GENTAMICIN <=1 SENSITIVE Sensitive     IMIPENEM <=0.25 SENSITIVE Sensitive     TRIMETH/SULFA <=20 SENSITIVE Sensitive     AMPICILLIN/SULBACTAM 16 INTERMEDIATE Intermediate     PIP/TAZO 16 SENSITIVE Sensitive     * ABUNDANT KLEBSIELLA OXYTOCA  Culture, body fluid w Gram Stain-bottle     Status: None   Collection Time: 01/30/22 10:04 AM   Specimen: Pleura  Result Value Ref Range Status   Specimen  Description PLEURAL  Final   Special Requests NONE  Final   Culture   Final    NO GROWTH 5 DAYS Performed at [place] [address] [place], [place] [address]    Report Status 02/04/2022 FINAL  Final  Gram stain     Status: None   Collection Time: 01/30/22 10:04 AM   Specimen: Pleura  Result Value Ref Range Status   Specimen Description PLEURAL  Final   Special Requests NONE  Final   Gram Stain   Final    RARE WBC  PRESENT, PREDOMINANTLY MONONUCLEAR NO ORGANISMS SEEN Performed at [facility], [place] [address] [place],  [address]    Report Status 01/30/2022 FINAL  Final    RADIOLOGY STUDIES/RESULTS: No results found.   LOS: 35 days   Oren Binet, MD  [facility]    To contact the attending provider between 7A-7P or the covering provider during after hours 7P-7A, please log into the web site [email or web address] and access using universal [place] password for that web site. If you do not have the password, please call the hospital operator.  02/05/2022, 11:00 AM

## 2022-02-05 NOTE — Consult Note (Signed)
[place] Nurse wound follow up Patient receiving care in [place]. PA-C K. Maxwell Caul, present at time of VAC change. Wound type: abdominal surgical Measurement: deferred Wound bed: beefy red Drainage (amount, consistency, odor) reddish tinged in cannister Periwound: intact Dressing procedure/placement/frequency: All black foam removed from wound bed. One piece of VAC foam cut to fit wound bed. Drape applied, immediate seal obtained.  Patient was premedicated for dressing change before procedure started.  POC is for [facility] nurse to discontinue the VAC to the wound Monday 9/4. Likely switch to twice daily saline moistened gauze dressing.  PA will decide frequency of the dressing changes next week on Monday.   [place] Nurse ostomy follow up Stoma type/location: RUQ ileostomy Stomal assessment/size: 1 inch round, budded Peristomal assessment: intact Treatment options for stomal/peristomal skin: barrier ring Output: small amount of thin stool in existing pouch  Ostomy pouching: 1pc. Flex convex. Extras of ostomy supplies in belongings bag on nightstand. Education provided:  Enrolled patient in [employer] Discharge program: No   Patient not ready to learn ostomy care.  Val Riles, RN, MSN, CWOCN, CNS-BC, pager [phone]

## 2022-02-05 NOTE — Progress Notes (Signed)
Physical Therapy Treatment Patient Details Name: [name] MRN: [id number] DOB: December 30, 1940 Today's Date: 02/05/2022   History of Present Illness 81 y.o. female admitted 12/24/2021 with CAP.  7/30 AMS, head CT negative for acute abnormality. Code blue 7/30, ROSC achieved after 7 min CPR, pt intubated. Chest tube insertion 7/31; pt removed CT 8/1. ETT 7/30-8/2. Continued AMS, hallucinations overnight 8/2; head CT 8/2 remains negative for acute abnormality. EEG 8/2 with no seizure activity. Improved cognition 8/5, but persistent tachycardia/afib with RVR. 8/6 ischemic colitis. 8/7 shock with intubation and to OR for ex lap. 8/9 abdomen closure. 8/10 extubation. 8/21 respiratory distress with reintubation. 8/23 return to OR for ex lap and small bowel resection with ileostomy. Extubated 8/26 and thoracentesis. 8/28 respiratory distress with urgent thoracentesis. PMHx:Afib, HTN, breast CA, melanoma, asthma, bilateral TKA, anxiety.    PT Comments    Pt demonstrates improved tolerance for mobility this session, transferring multiple times. Pt remains anxious about transfer training, requiring encouragement. Pt's strength is much improved as well today. PT provides reinforcement for daily out of bed mobility. PT hopeful for progression to gait training next session. PT updates recommendations to AIR at this time as the pt demonstrates the potential to make significant functional gains with high intensity inpatient PT services.   Recommendations for follow up therapy are one component of a multi-disciplinary discharge planning process, led by the attending physician.  Recommendations may be updated based on patient status, additional functional criteria and insurance authorization.  Follow Up Recommendations  Acute inpatient rehab (3hours/day) Can patient physically be transported by private vehicle: No   Assistance Recommended at Discharge Intermittent Supervision/Assistance  Patient can return home with  the following A lot of help with walking and/or transfers;A lot of help with bathing/dressing/bathroom;Assistance with cooking/housework;Assist for transportation;Help with stairs or ramp for entrance   Equipment Recommendations  Hospital bed;Wheelchair (measurements PT)    Recommendations for Other Services       Precautions / Restrictions Precautions Precautions: Fall;Other (comment) Precaution Comments: ileostomy, PICC line, wound vac Restrictions Weight Bearing Restrictions: No     Mobility  Bed Mobility Overal bed mobility: Needs Assistance Bed Mobility: Sit to Supine       Sit to supine: Mod assist        Transfers Overall transfer level: Needs assistance Equipment used: 1 person hand held assist Transfers: Sit to/from Stand, Bed to chair/wheelchair/BSC Sit to Stand: Mod assist Stand pivot transfers: Min assist         General transfer comment: pt stands twice during session, with one stand pivot transfer    Ambulation/Gait                   Stairs             Wheelchair Mobility    Modified Rankin (Stroke Patients Only)       Balance Overall balance assessment: Needs assistance Sitting-balance support: No upper extremity supported, Feet supported Sitting balance-Leahy Scale: Fair     Standing balance support: Bilateral upper extremity supported Standing balance-Leahy Scale: Poor Standing balance comment: BUE support of PT                            Cognition Arousal/Alertness: Awake/alert Behavior During Therapy: Flat affect Overall Cognitive Status: Impaired/Different from baseline Area of Impairment: Following commands, Safety/judgement, Awareness, Problem solving  Following Commands: Follows one step commands consistently, Follows multi-step commands with increased time Safety/Judgement: Decreased awareness of safety, Decreased awareness of deficits Awareness: Emergent Problem  Solving: Slow processing          Exercises      General Comments General comments (skin integrity, edema, etc.): VSS on 2L [place] at rest, pt desats to mid-80s when weaned to room air. PT places pt on 3L [place] to recover, RN aware      Pertinent Vitals/Pain Pain Assessment Pain Assessment: Faces Faces Pain Scale: Hurts even more Pain Location: abdomen Pain Descriptors / Indicators: Grimacing Pain Intervention(s): Monitored during session    Home Living                          Prior Function            PT Goals (current goals can now be found in the care plan section) Acute Rehab PT Goals Patient Stated Goal: return home Progress towards PT goals: Progressing toward goals    Frequency    Min 3X/week      PT Plan Current plan remains appropriate    Co-evaluation              AM-PAC PT "6 Clicks" Mobility   Outcome Measure  Help needed turning from your back to your side while in a flat bed without using bedrails?: A Lot Help needed moving from lying on your back to sitting on the side of a flat bed without using bedrails?: A Lot Help needed moving to and from a bed to a chair (including a wheelchair)?: A Lot Help needed standing up from a chair using your arms (e.g., wheelchair or bedside chair)?: A Lot Help needed to walk in hospital room?: Total Help needed climbing 3-5 steps with a railing? : Total 6 Click Score: 10    End of Session Equipment Utilized During Treatment: Oxygen Activity Tolerance: Patient limited by fatigue Patient left: in bed;with call bell/phone within reach;with bed alarm set;with family/visitor present Nurse Communication: Mobility status PT Visit Diagnosis: Other abnormalities of gait and mobility (R26.89);Difficulty in walking, not elsewhere classified (R26.2);Muscle weakness (generalized) (M62.81)     Time: 1341-1410 PT Time Calculation (min) (ACUTE ONLY): 29 min  Charges:  $Therapeutic Activity: 23-37 mins                      Zenaida Niece, PT, DPT Acute Rehabilitation Office [place] Aislyn Hayse 02/05/2022, 2:39 PM

## 2022-02-05 NOTE — Progress Notes (Addendum)
[facility] Progress Note  9 Days Post-Op  Subjective: Pain better today with adjustment in pain meds.  Ate all of at least one of her trays yesterday and is drinking some of her protein shakes.  Objective: Vital signs in last 24 hours: Temp:  [97.4 F (36.3 C)-98.8 F (37.1 C)] 97.4 F (36.3 C) (09/01 0820) Pulse Rate:  [46-100] 97 (09/01 0820) Resp:  [17-20] 18 (09/01 0820) BP: (101-127)/(45-64) 122/54 (09/01 0820) SpO2:  [93 %-96 %] 93 % (09/01 0820) Weight:  [73.7 kg] 73.7 kg (09/01 0421) Last BM Date : 01/26/22  Intake/Output from previous day: 08/31 0701 - 09/01 0700 In: 1036.5 [P.O.:500; I.V.:536.5] Out: 3325 [Urine:2925; Stool:400] Intake/Output this shift: No intake/output data recorded.  PE: Gen:  Alert Abd: Soft, appropriately tender, but slightly more so on the left than right, ostomy viable with thin bilious fluid in bag.  VAC change done after my first visit, see picture     Lab Results:  Recent Labs    02/03/22 0549 02/04/22 0611  WBC 11.2* 9.0  HGB 9.2* 9.0*  HCT 30.0* 29.6*  PLT 232 210   BMET Recent Labs    02/04/22 0611 02/05/22 0650  NA 140 136  K 4.4 4.1  CL 96* 91*  CO2 39* 39*  GLUCOSE 117* 124*  BUN 26* 29*  CREATININE 0.45 0.49  CALCIUM 9.2 9.3   PT/INR No results for input(s): "LABPROT", "INR" in the last 72 hours. CMP     Component Value Date/Time   NA 136 02/05/2022 0650   NA 139 11/18/2021 1432   NA 134 (L) 04/25/2017 1216   K 4.1 02/05/2022 0650   K 4.3 04/25/2017 1216   CL 91 (L) 02/05/2022 0650   CO2 39 (H) 02/05/2022 0650   CO2 29 04/25/2017 1216   GLUCOSE 124 (H) 02/05/2022 0650   GLUCOSE 85 04/25/2017 1216   BUN 29 (H) 02/05/2022 0650   BUN 10 11/18/2021 1432   BUN 14.4 04/25/2017 1216   CREATININE 0.49 02/05/2022 0650   CREATININE 0.72 01/17/2018 1347   CREATININE 0.7 04/25/2017 1216   CALCIUM 9.3 02/05/2022 0650   CALCIUM 10.0 04/25/2017 1216   PROT 5.9 (L) 02/03/2022 0549   PROT 7.2  04/25/2017 1216   ALBUMIN 2.0 (L) 02/03/2022 0549   ALBUMIN 3.7 04/25/2017 1216   AST 23 02/03/2022 0549   AST 20 01/17/2018 1347   AST 21 04/25/2017 1216   ALT 14 02/03/2022 0549   ALT 10 01/17/2018 1347   ALT 10 04/25/2017 1216   ALKPHOS 92 02/03/2022 0549   ALKPHOS 75 04/25/2017 1216   BILITOT 0.4 02/03/2022 0549   BILITOT 0.5 01/17/2018 1347   BILITOT 0.65 04/25/2017 1216   GFRNONAA >60 02/05/2022 0650   GFRNONAA >60 01/17/2018 1347   GFRAA 90 04/24/2020 1235   GFRAA >60 01/17/2018 1347   Lipase     Component Value Date/Time   LIPASE 77 07/28/2008 1630       Studies/Results: No results found.  Anti-infectives: Anti-infectives (From admission, onward)    Start     Dose/Rate Route Frequency Ordered Stop   01/26/22 1400  piperacillin-tazobactam (ZOSYN) IVPB 3.375 g        3.375 g 12.5 mL/hr over 240 Minutes Intravenous Every 8 hours 01/26/22 0945 02/02/22 0150   01/06/2022 1030  vancomycin (VANCOREADY) IVPB 750 mg/150 mL  Status:  Discontinued        750 mg 150 mL/hr over 60 Minutes Intravenous Every 24 hours  01/20/2022 0937 01/28/22 1520   01/17/2022 1000  vancomycin (VANCOREADY) IVPB 1500 mg/300 mL  Status:  Discontinued        1,500 mg 150 mL/hr over 120 Minutes Intravenous Every 48 hours 01/23/22 0909 01/14/2022 0937   01/23/22 1030  vancomycin (VANCOREADY) IVPB 1250 mg/250 mL        1,250 mg 166.7 mL/hr over 90 Minutes Intravenous  Once 01/23/22 0907 01/23/22 1259   01/23/22 1000  metroNIDAZOLE (FLAGYL) IVPB 500 mg  Status:  Discontinued        500 mg 100 mL/hr over 60 Minutes Intravenous Every 12 hours 01/23/22 0839 01/26/22 0945   01/23/22 1000  ceFEPIme (MAXIPIME) 2 g in sodium chloride 0.9 % 100 mL IVPB  Status:  Discontinued        2 g 200 mL/hr over 30 Minutes Intravenous Every 12 hours 01/23/22 0901 01/26/22 0945   01/14/2022 1400  micafungin (MYCAMINE) 100 mg in sodium chloride 0.9 % 100 mL IVPB  Status:  Discontinued        100 mg 105 mL/hr over 1 Hours  Intravenous Every 24 hours 01/08/2022 1302 01/15/22 1211   01/10/22 1515  piperacillin-tazobactam (ZOSYN) IVPB 3.375 g        3.375 g 12.5 mL/hr over 240 Minutes Intravenous Every 8 hours 01/10/22 1427 01/12/2022 2359   01/04/22 2000  vancomycin (VANCOCIN) IVPB 1000 mg/200 mL premix  Status:  Discontinued        1,000 mg 200 mL/hr over 60 Minutes Intravenous Every 24 hours 01/03/22 1910 01/05/22 1321   01/03/22 2200  ceFEPIme (MAXIPIME) 2 g in sodium chloride 0.9 % 100 mL IVPB        2 g 200 mL/hr over 30 Minutes Intravenous Every 12 hours 01/03/22 1910 01/08/22 2209   01/03/22 1930  vancomycin (VANCOREADY) IVPB 1250 mg/250 mL        1,250 mg 166.7 mL/hr over 90 Minutes Intravenous  Once 01/03/22 1910 01/03/22 2219   01/02/22 2100  azithromycin (ZITHROMAX) 500 mg in sodium chloride 0.9 % 250 mL IVPB        500 mg 250 mL/hr  Intravenous Every 24 hours 12/30/2021 2248 01/06/22 2149   01/02/22 2000  cefTRIAXone (ROCEPHIN) 2 g in sodium chloride 0.9 % 100 mL IVPB  Status:  Discontinued        2 g 200 mL/hr over 30 Minutes Intravenous Every 24 hours 12/25/2021 2248 01/03/22 1733   12/27/2021 2030  cefTRIAXone (ROCEPHIN) 1 g in sodium chloride 0.9 % 100 mL IVPB        1 g 200 mL/hr over 30 Minutes Intravenous  Once 12/19/2021 2025 12/15/2021 2141   12/30/2021 2030  azithromycin (ZITHROMAX) 500 mg in sodium chloride 0.9 % 250 mL IVPB        500 mg 250 mL/hr over 60 Minutes Intravenous  Once 12/24/2021 2025 12/22/2021 2312        Assessment/Plan POD #25/23, S/p exploratory laparotomy,  R colectomy, abthera placement 01/10/2022 Dr. Rosendo Gros S/p exploratory laparotomy, intestinal anastomosis, abdominal closure 01/08/2022 Dr. Rosendo Gros  for ischemic bowel with perforation POD#9 S/p exploratory laparotomy, extensive LOA, resection distal small bowel and ileocolonic anastomosis, creation end ileostomy 8/23 Dr. Redmond Pulling - Ostomy with some thin bilious output. Continue TPN for now.  -soft diet, breeze TID.  Continue TNA until  better oral intake - WOC consult for new ileostomy and VAC - Vac MWF - continue zosyn x5 days postop (end date 8/28), WBC normal - pain improved with adjustment  in pain meds.  Given she is AF VSS, will continue to hold on a scan at this time.  -addendum: likely remove VAC on Monday due to shallowness of wound.   FEN: soft, breeze, TNA ID: Zosyn (stop date 8/14), cefepime/flagyl 8/19 >>8/21, Vanc/zosyn 8/21 --> 8/28 VTE: SCD's, hold DOAC/ heparin gtt Dispo: floor   Below per TRH/CCM -- Septic shock due to above Acute hypoxic respiratory failure - per CCM, extubated on 8/26 Cardiac arrest 7/30, 7 mins CPR Hx cardiomyopathy COPD Afib, permanent CAP DNR    LOS: 53 days    Henreitta Cea, [facility] Surgery 02/05/2022, 8:56 AM Please see Amion for pager number during day hours 7:00am-4:30pm

## 2022-02-05 NOTE — Progress Notes (Signed)
Inpatient Rehab Admissions Coordinator:   Per therapy recommendations,  patient was screened for CIR candidacy by Clemens Catholic, MS, CCC-SLP. At this time, Pt. Appears to be a a potential candidate for CIR. I will place an   order for rehab consult per protocol for full assessment. Please contact me any with questions.  Clemens Catholic, [place], [place] Admissions Coordinator  [phone] ([place]) [phone] (office)

## 2022-02-05 NOTE — Progress Notes (Signed)
Nutrition Follow-up  DOCUMENTATION CODES:   Severe malnutrition in context of acute illness/injury  INTERVENTION:   Recommend continuing nutrition support until po intake improves; pt is appropriate for Cortrak insertion if pt will agree to this. Otherwise, recommend continuing TPN at goal rate   Continue Boost Breeze po TID, each supplement provides 250 kcal and 9 grams of protein  Continue Magic cup TID with meals, each supplement provides 290 kcal and 9 grams of protein  Snacks TID between meals  NUTRITION DIAGNOSIS:   Severe Malnutrition related to acute illness as evidenced by mild fat depletion, mild muscle depletion, moderate muscle depletion, severe fat depletion.  Ongoing  GOAL:   Patient will meet greater than or equal to 90% of their needs  Goal met via TPN  MONITOR:   PO intake, Diet advancement, Supplement acceptance, Weight trends, Skin (TPN)  REASON FOR ASSESSMENT:   Consult, Ventilator Enteral/tube feeding initiation and management  ASSESSMENT:   81 yo female admitted with acute respiratory failure related to COPD exacerbation and pneumonia, went into PEA arrest and required intubation. PMH includes A.fib, breast cancer, HTN, GERD, asthma, melanoma  Noted plans for possible CIR. Also noted plans to d/c the [facility] to wound on Monday 9/4.   Pt sitting up in chair, husband at bedside at time of visit. She did not provide many responses to RD questions. Spoke with RN in the hallway. She states that she has eaten very minimally within the last 2 days, taking sips of broth. Yesterday she had at least 1 [employer] and some of a second. She is finishing all of her apple sauces and taking few bites of Magic Cup.   Diet advanced from clear liquid to soft diet today  Meal completions: 08/31: 0% x2 recorded meals  Admit weight: 63.8 kg  Current weight: 73.7 kg  Edema: mild pitting BUE, moderate BLE  Medications: lasix, remeron, protonix  Labs: BUN 29, CBG's  97-139 x24 hours  Diet Order:   Diet Order             DIET SOFT Room service appropriate? Yes; Fluid consistency: Thin  Diet effective now                   EDUCATION NEEDS:   Education needs have been addressed  Skin:  Skin Assessment: Skin Integrity Issues: Skin Integrity Issues:: Other (Comment) Wound Vac: midline abd wound-dehisced Incisions: new ilesostomy Other: partial thickness wound to nares of nose  Last BM:  new ileostomy with thin bilious output  Height:   Ht Readings from Last 1 Encounters:  02/01/22 5' (1.524 m)    Weight:   Wt Readings from Last 1 Encounters:  02/05/22 73.7 kg    BMI:  Body mass index is 31.73 kg/m.  Estimated Nutritional Needs:   Kcal:  1700-1900 kcals  Protein:  95-115 g  Fluid:  >/= 1.5 L  Clayborne Dana, RDN, LDN Clinical Nutrition

## 2022-02-05 NOTE — Progress Notes (Signed)
Occupational Therapy Treatment Patient Details Name: [name] MRN: [id number] DOB: 1940/12/30 Today's Date: 02/05/2022   History of present illness 81 y.o. female admitted 12/28/2021 with CAP.  7/30 AMS, head CT negative for acute abnormality. Code blue 7/30, ROSC achieved after 7 min CPR, pt intubated. Chest tube insertion 7/31; pt removed CT 8/1. ETT 7/30-8/2. Continued AMS, hallucinations overnight 8/2; head CT 8/2 remains negative for acute abnormality. EEG 8/2 with no seizure activity. Improved cognition 8/5, but persistent tachycardia/afib with RVR. 8/6 ischemic colitis. 8/7 shock with intubation and to OR for ex lap. 8/9 abdomen closure. 8/10 extubation. 8/21 respiratory distress with reintubation. 8/23 return to OR for ex lap and small bowel resection with ileostomy. Extubated 8/26 and thoracentesis. 8/28 respiratory distress with urgent thoracentesis. PMHx:Afib, HTN, breast CA, melanoma, asthma, bilateral TKA, anxiety.   OT comments  Patient with fair progress toward patient focused goals.  She was able to transition to edge of bed sitting with Mod A, and perform a step pivot face to face transfer with Min A.  Deficts listed below remain, but she demonstrates the ability to improve mobility, which in turn with increase her independence with ADL's.  OT will continue efforts in the acute setting, and AIR continues to be recommended, as the patient has the potential to participate with 3 hours of rehab per day, given PLOF.      Recommendations for follow up therapy are one component of a multi-disciplinary discharge planning process, led by the attending physician.  Recommendations may be updated based on patient status, additional functional criteria and insurance authorization.    Follow Up Recommendations  Acute inpatient rehab (3hours/day)    Assistance Recommended at Discharge Frequent or constant Supervision/Assistance  Patient can return home with the following  Assistance with  cooking/housework;Assistance with feeding;Direct supervision/assist for medications management;Direct supervision/assist for financial management;Assist for transportation;Help with stairs or ramp for entrance;Two people to help with walking and/or transfers;A lot of help with bathing/dressing/bathroom   Equipment Recommendations  BSC/3in1;Tub/shower seat    Recommendations for Other Services      Precautions / Restrictions Precautions Precautions: Fall;Other (comment) Precaution Comments: ileostomy, PICC line, wound vac Restrictions Weight Bearing Restrictions: No       Mobility Bed Mobility Overal bed mobility: Needs Assistance Bed Mobility: Supine to Sit     Supine to sit: HOB elevated, Mod assist       Patient Response: Cooperative  Transfers Overall transfer level: Needs assistance Equipment used: 1 person hand held assist Transfers: Sit to/from Stand, Bed to chair/wheelchair/BSC Sit to Stand: Min assist     Step pivot transfers: Min assist           Balance Overall balance assessment: Needs assistance Sitting-balance support: Bilateral upper extremity supported, Feet supported Sitting balance-Leahy Scale: Fair     Standing balance support: Bilateral upper extremity supported Standing balance-Leahy Scale: Poor                             ADL either performed or assessed with clinical judgement   ADL       Grooming: Set up;Sitting           Upper Body Dressing : Moderate assistance;Sitting   Lower Body Dressing: Maximal assistance;Sit to/from stand   Toilet Transfer: Minimal assistance;Stand-pivot                  Extremity/Trunk Assessment Upper Extremity Assessment Upper Extremity Assessment: Generalized weakness  Vision Patient Visual Report: No change from baseline     Perception Perception Perception: Within Functional Limits   Praxis Praxis Praxis: Intact    Cognition Arousal/Alertness:  Awake/alert Behavior During Therapy: Flat affect Overall Cognitive Status: Impaired/Different from baseline                         Following Commands: Follows one step commands inconsistently   Awareness: Intellectual Problem Solving: Slow processing, Decreased initiation          Exercises      Shoulder Instructions       General Comments  HR 101 with mobility    Pertinent Vitals/ Pain       Pain Assessment Pain Assessment: Faces Faces Pain Scale: Hurts even more Pain Location: abdomen Pain Descriptors / Indicators: Moaning, Grimacing Pain Intervention(s): Monitored during session, Premedicated before session                                                          Frequency  Min 2X/week        Progress Toward Goals  OT Goals(current goals can now be found in the care plan section)     Acute Rehab OT Goals Time For Goal Achievement: 02/12/22 Potential to Achieve Goals: Good  Plan Discharge plan remains appropriate    Co-evaluation                 AM-PAC OT "6 Clicks" Daily Activity     Outcome Measure   Help from another person eating meals?: A Lot Help from another person taking care of personal grooming?: A Little Help from another person toileting, which includes using toliet, bedpan, or urinal?: A Lot Help from another person bathing (including washing, rinsing, drying)?: A Lot Help from another person to put on and taking off regular upper body clothing?: A Lot Help from another person to put on and taking off regular lower body clothing?: A Lot 6 Click Score: 13    End of Session    OT Visit Diagnosis: Other abnormalities of gait and mobility (R26.89);Other symptoms and signs involving cognitive function;Muscle weakness (generalized) (M62.81)   Activity Tolerance Patient tolerated treatment well   Patient Left in chair;with call bell/phone within reach;with family/visitor present   Nurse  Communication Mobility status        Time: 2297-9892 OT Time Calculation (min): 23 min  Charges: OT General Charges $OT Visit: 1 Visit OT Treatments $Self Care/Home Management : 23-37 mins  02/05/2022  RP, OTR/L  Acute Rehabilitation Services  Office:  [phone]   Metta Clines 02/05/2022, 11:55 AM

## 2022-02-05 NOTE — Progress Notes (Signed)
PHARMACY - TOTAL PARENTERAL NUTRITION CONSULT NOTE  Indication: Intolerance to enteral feeding / anastomotic leak  Patient Measurements: Height: 5' (152.4 cm) Weight: 73.7 kg (162 lb 7.7 oz) IBW/kg (Calculated) : 45.5 TPN AdjBW (KG): 53.7 Body mass index is 31.73 kg/m.  Assessment:  8 YOF admited for CAP causing respiratory failure, had a cardiac arrest after admission and was moved to the ICU.  Chest tube placed for parapneumonic effusion, extubated on 8/2. Sent out of ICU on 8/5. On 8/6 she developed ischemic colitis, initially treated conservatively but transferred back to ICU for worsening shock and confusion 8/7.  Now s/p ex lap, R colectomy for ischemic bowel with perforation. Pt was provided TPN from 8/9-8/15. Pt's diet was advanced and pt ate 25-50% of meals from 8/15-8/20. Diet was changed to CLD on 8/20 for IR placement of drain for anastomotic leak. However, drain was not placed because pt acutely decompensated and was intubated and admitted to ICU and made NPO on 8/21. IR placed drain on 8/22 and drainage was positive for enteric contents confirming anastomotic leak. Pharmacy consulted to manage TPN.  9/1: Spoke with patient to discuss oral intake since advancing diet. Still complains of abdominal pain limiting her oral intake. Doesn't really feel hungry, but not experience nausea/vomiting.  Glucose / Insulin: A1c 4.9% - CBGs < 140. Not on SSI Electrolytes: Na 136 (was hypoNa earlier in admission), Cl 91 (on Max Cl), CO2 39, CoCa 10.9, none in TPN (Ca x Phos = 39, goal < 55), others WNL  Goal K >/ 4 and Mg >/2 for ileus and Afib Renal: SCr < 1, BUN 29 (increasing) Hepatic: LFTs / tbili / TG WNL, albumin 2 Intake / Output; MIVF: UOP 1.7 ml/kg/hr, stool 400 mL, net +2.2 L, Lasix 40 IV BID scheduled.  GI Imaging: 8/6 KUB: Mild gaseous distension favors ileus  8/6 CT: concerning for iscehmic colitis; possible SBO and/or inflammatory ileus  8/7 KUB: diffuse gaseous small bowel and  colonic distension 8/20 CT: extraluminal collection of gas and fluid possibly representing an anastomotic leak, postop fluid collection, or abscess GI Surgeries / Procedures:  8/7 Ex lap, colon resection 8/9 Ex lap, intestinal anastomosis, wound closure 8/22 CT-guided anterior RLQ abscess drain placement 8/23 Ex lap with resection of distal small bowel and ileocolonic anastomosis, creation of end ileostomy, and lysis of adhesions   Central access: PICC placed 01/12/22 TPN start date:  01/10/2022-01/19/22, restarted 8/23  Nutritional Goals: RD Estimated Needs Total Energy Estimated Needs: 1700-1900 kcals Total Protein Estimated Needs: 95-115 g Total Fluid Estimated Needs: >/= 1.5 L  Current Nutrition:  TPN  Boost Breeze TID - 2 documented yesterday 8/30 FLD  Plan:  Continue concentrated TPN at goal rate of 65 ml/hr to provide 104g AA, 234g CHO, 61g lipids, and 1821 kCal, meeting 100% of nutrition needs Electrolytes in TPN: continue Na 90mq/L, K 624m/L, Ca removed 8/26, max Mg at 1545mL, Phos 56m4mL, max CL for now  Add standard MVI and trace elements to TPN  CBG checks Q8H to monitor for hypoglycemia Standard TPN labs on Mon/Thurs Daily BMP and Mg while on Lasix scheduled F/u toleration and diet advancement    LoriErskine SpeedarmD Clinical Pharmacist 02/05/2022, 9:45 AM

## 2022-02-05 DEATH — deceased

## 2022-02-06 ENCOUNTER — Inpatient Hospital Stay (HOSPITAL_COMMUNITY): Payer: Medicare Other

## 2022-02-06 DIAGNOSIS — J189 Pneumonia, unspecified organism: Secondary | ICD-10-CM | POA: Diagnosis not present

## 2022-02-06 DIAGNOSIS — E43 Unspecified severe protein-calorie malnutrition: Secondary | ICD-10-CM | POA: Diagnosis not present

## 2022-02-06 DIAGNOSIS — I469 Cardiac arrest, cause unspecified: Secondary | ICD-10-CM | POA: Diagnosis not present

## 2022-02-06 DIAGNOSIS — K559 Vascular disorder of intestine, unspecified: Secondary | ICD-10-CM | POA: Diagnosis not present

## 2022-02-06 LAB — CBC WITH DIFFERENTIAL/PLATELET
Abs Immature Granulocytes: 0.24 10*3/uL — ABNORMAL HIGH (ref 0.00–0.07)
Basophils Absolute: 0.1 10*3/uL (ref 0.0–0.1)
Basophils Relative: 1 %
Eosinophils Absolute: 0.2 10*3/uL (ref 0.0–0.5)
Eosinophils Relative: 2 %
HCT: 27.8 % — ABNORMAL LOW (ref 36.0–46.0)
Hemoglobin: 8.8 g/dL — ABNORMAL LOW (ref 12.0–15.0)
Immature Granulocytes: 3 %
Lymphocytes Relative: 17 %
Lymphs Abs: 1.5 10*3/uL (ref 0.7–4.0)
MCH: 31.1 pg (ref 26.0–34.0)
MCHC: 31.7 g/dL (ref 30.0–36.0)
MCV: 98.2 fL (ref 80.0–100.0)
Monocytes Absolute: 1.2 10*3/uL — ABNORMAL HIGH (ref 0.1–1.0)
Monocytes Relative: 14 %
Neutro Abs: 5.8 10*3/uL (ref 1.7–7.7)
Neutrophils Relative %: 63 %
Platelets: 225 10*3/uL (ref 150–400)
RBC: 2.83 MIL/uL — ABNORMAL LOW (ref 3.87–5.11)
RDW: 18.4 % — ABNORMAL HIGH (ref 11.5–15.5)
WBC: 9 10*3/uL (ref 4.0–10.5)
nRBC: 0.3 % — ABNORMAL HIGH (ref 0.0–0.2)

## 2022-02-06 LAB — BASIC METABOLIC PANEL
Anion gap: 6 (ref 5–15)
BUN: 27 mg/dL — ABNORMAL HIGH (ref 8–23)
CO2: 38 mmol/L — ABNORMAL HIGH (ref 22–32)
Calcium: 9.4 mg/dL (ref 8.9–10.3)
Chloride: 94 mmol/L — ABNORMAL LOW (ref 98–111)
Creatinine, Ser: 0.53 mg/dL (ref 0.44–1.00)
GFR, Estimated: >60 mL/min (ref >60)
Glucose, Bld: 121 mg/dL — ABNORMAL HIGH (ref 70–99)
Potassium: 3.9 mmol/L (ref 3.5–5.1)
Sodium: 138 mmol/L (ref 135–145)

## 2022-02-06 LAB — GLUCOSE, CAPILLARY
Glucose-Capillary: 122 mg/dL — ABNORMAL HIGH (ref 70–99)
Glucose-Capillary: 138 mg/dL — ABNORMAL HIGH (ref 70–99)
Glucose-Capillary: 139 mg/dL — ABNORMAL HIGH (ref 70–99)

## 2022-02-06 LAB — MAGNESIUM: Magnesium: 1.7 mg/dL (ref 1.7–2.4)

## 2022-02-06 MED ORDER — FUROSEMIDE 10 MG/ML IJ SOLN
60.0000 mg | Freq: Two times a day (BID) | INTRAMUSCULAR | Status: DC
  Administered 2022-02-06 – 2022-02-10 (×9): 60 mg via INTRAVENOUS
  Filled 2022-02-06 (×9): qty 6

## 2022-02-06 MED ORDER — MORPHINE SULFATE (CONCENTRATE) 10 MG/0.5ML PO SOLN
5.0000 mg | ORAL | Status: DC | PRN
  Administered 2022-02-07 – 2022-02-11 (×4): 5 mg via ORAL
  Filled 2022-02-06 (×4): qty 0.5

## 2022-02-06 MED ORDER — FUROSEMIDE 10 MG/ML IJ SOLN
40.0000 mg | Freq: Once | INTRAMUSCULAR | Status: AC
  Administered 2022-02-06: 40 mg via INTRAVENOUS
  Filled 2022-02-06: qty 4

## 2022-02-06 MED ORDER — TRACE MINERALS CU-MN-SE-ZN 300-55-60-3000 MCG/ML IV SOLN
INTRAVENOUS | Status: AC
  Filled 2022-02-06: qty 696.8

## 2022-02-06 MED ORDER — ACETAMINOPHEN 500 MG PO TABS
1000.0000 mg | ORAL_TABLET | Freq: Three times a day (TID) | ORAL | Status: DC
  Administered 2022-02-06 – 2022-02-08 (×6): 1000 mg via ORAL
  Filled 2022-02-06 (×6): qty 2

## 2022-02-06 MED ORDER — METHOCARBAMOL 500 MG PO TABS
750.0000 mg | ORAL_TABLET | Freq: Three times a day (TID) | ORAL | Status: DC | PRN
Start: 2022-02-06 — End: 2022-02-11
  Administered 2022-02-07 – 2022-02-10 (×2): 750 mg via ORAL
  Filled 2022-02-06 (×4): qty 2

## 2022-02-06 MED ORDER — MAGNESIUM SULFATE 2 GM/50ML IV SOLN
2.0000 g | Freq: Once | INTRAVENOUS | Status: AC
  Administered 2022-02-06: 2 g via INTRAVENOUS
  Filled 2022-02-06: qty 50

## 2022-02-06 NOTE — Progress Notes (Addendum)
Feels SOB at rest  Slightly labored breathing but not in any distress  On exam: Some mild wheezing here and there  Repeat CXR: personally reviewed-looks very similar to her CXR on 8/28-with what looks like at Rt Pl effusion  Plan Continue bronchodilators Extra IV Lasix given this afternoon Will d/w PCCM if thora will be beneficial-not sure as CXR looks almost similar to her last CXR that was done post thoracocentesis Goals of care very clear-NO BIPAP-NO Intubation-Ok for thoracentesis. If she deteriorates, she is aware that apart from hospice/end of life care, really no other options.   Addendum: d/w PCCM team-no benefit from thoracentesis.   Son-Todd at bedside

## 2022-02-06 NOTE — Progress Notes (Signed)
Inpatient Rehab Admissions Coordinator:    I met with husband and pt. And they are interested in CIR. Aware that it will likely require an appeal with insurance. I will open a case with her insurance and pursue for auth. Husband can provide 24/7 support at d/c.  Clemens Catholic, [place], [place] Admissions Coordinator  [phone] ([place]) [phone] (office)

## 2022-02-06 NOTE — Progress Notes (Signed)
PHARMACY - TOTAL PARENTERAL NUTRITION CONSULT NOTE  Indication: Intolerance to enteral feeding / anastomotic leak  Patient Measurements: Height: 5' (152.4 cm) Weight: 72.3 kg (159 lb 6.3 oz) IBW/kg (Calculated) : 45.5 TPN AdjBW (KG): 53.7 Body mass index is 31.13 kg/m.  Assessment:  41 YOF admited for CAP causing respiratory failure, had a cardiac arrest after admission and was moved to the ICU.  Chest tube placed for parapneumonic effusion, extubated on 8/2. Sent out of ICU on 8/5. On 8/6 she developed ischemic colitis, initially treated conservatively but transferred back to ICU for worsening shock and confusion 8/7.  Now s/p ex lap, R colectomy for ischemic bowel with perforation. Pt was provided TPN from 8/9-8/15. Pt's diet was advanced and pt ate 25-50% of meals from 8/15-8/20. Diet was changed to CLD on 8/20 for IR placement of drain for anastomotic leak. However, drain was not placed because pt acutely decompensated and was intubated and admitted to ICU and made NPO on 8/21. IR placed drain on 8/22 and drainage was positive for enteric contents confirming anastomotic leak. Pharmacy consulted to manage TPN.  9/1: Spoke with patient to discuss oral intake since advancing diet. Still complains of abdominal pain limiting her oral intake. Doesn't really feel hungry, but not experience nausea/vomiting. 9/2: Pt with minimal po intake - taking some Boost Breeze and applesauce. Noted plan to remove wound VAC on Monday.  Glucose / Insulin: A1c 4.9% - CBGs < 140. Not on SSI Electrolytes: Mg down to 1.7 (max in TPN), Cl 94 (on max Cl), CO2 38, CoCa 11, none in TPN (Ca x Phos = 40, goal < 55), K down just slightly to 3.9 (may need increase in TPN tomorrow) Goal K > 4 and Mg > 2 for ileus and Afib Renal: SCr < 1, BUN 27 Hepatic: LFTs / tbili / TG WNL, albumin 2 Intake / Output; MIVF: UOP 1750 ml yesterday, stool 250 mL, Lasix 40 IV BID scheduled.  GI Imaging: 8/6 KUB: Mild gaseous distension favors  ileus  8/6 CT: concerning for iscehmic colitis; possible SBO and/or inflammatory ileus  8/7 KUB: diffuse gaseous small bowel and colonic distension 8/20 CT: extraluminal collection of gas and fluid possibly representing an anastomotic leak, postop fluid collection, or abscess GI Surgeries / Procedures:  8/7 Ex lap, colon resection 8/9 Ex lap, intestinal anastomosis, wound closure 8/22 CT-guided anterior RLQ abscess drain placement 8/23 Ex lap with resection of distal small bowel and ileocolonic anastomosis, creation of end ileostomy, and lysis of adhesions   Central access: PICC placed 01/12/22 TPN start date:  01/26/2022-01/19/22, restarted 8/23  Nutritional Goals: RD Estimated Needs Total Energy Estimated Needs: 1700-1900 kcals Total Protein Estimated Needs: 95-115 g Total Fluid Estimated Needs: >/= 1.5 L  Current Nutrition:  TPN  8/30 FLD  Plan:  Continue concentrated TPN at goal rate of 65 ml/hr to provide 104g AA, 234g CHO, 61g lipids, and 1821 kCal, meeting 100% of nutrition needs Electrolytes in TPN: continue Na 75mq/L, K 668m/L, Ca removed 8/26, max Mg at 1559mL, Phos 64m39mL, max CL for now  Magnesium sulfate 2gm IV today Add standard MVI and trace elements to TPN  CBG checks Q8H to monitor for hypoglycemia Standard TPN labs on Mon/Thurs Daily BMP and Mg while on Lasix scheduled F/u diet advancement and po intake  CaroSherlon HandingarmD, BCPS Please see amion for complete clinical pharmacist phone list 02/06/2022, 7:32 AM

## 2022-02-06 NOTE — Progress Notes (Signed)
Pt c/o of SOB and audile wheezing heard. Breathing treatment given. MD notified. RT notified. IV lasix given. CXR ordered. Pt refusing BIPAP. 3L [place] intact. O2 sats > 90%. Will continue POC.

## 2022-02-06 NOTE — Progress Notes (Signed)
PROGRESS NOTE        PATIENT DETAILS Name: [name] Age: 81 y.o. Sex: female Date of Birth: 08/23/1940 Admit Date: 12/28/2021 Admitting Physician Kayleen Memos, DO KGM:WNUUVOZ, [place] [facility] [place]  Brief Summary: Patient is a 81 y.o.  female with history of persistent atrial fibrillation, HTN, chronic HFpEF, breast cancer on letrozole, pulmonary hypertension, asthma-who presented to the hospital on 7/28 with cough/shortness of breath-she was found to have acute hypoxic respiratory failure due to right lobar PNA.  She was started on IV antibiotics and admitted to the hospitalist service.  On 7/30-she sustained a in-hospital cardiac arrest thought to be due to underlying respiratory decline from pneumonia, she was intubated and transferred to the ICU.  CT chest showed a large right-sided pleural effusion-she underwent chest tube placement.  She was stabilized and transferred back to [facility].  Unfortunately-further hospital course was complicated by development of acute abdomen in the setting of ischemic colon with perforation requiring multiple laparotomies and bowel resection.  She remained in the ICU for while she underwent numerous surgical procedures-once stabilized-she was extubated and transferred back to [facility] on 8/31.  See below for further details.  Significant events: 7/28>> admit to [facility]-sepsis with hypoxia due to PNA.  7/30>> PEA arrest.  Transfer to ICU.  CT chest with worsening pleural effusion. 7/31>> Chest tube placed 8/01>> patient removed chest tube.   8/06>> Transfer to [facility] 8/07>> back to ICU-acute abdomen-hypertension-A-fib RVR.  Found to have ischemic colon with contained perforation-underwent exploratory laparotomy and colon resection. 8/09>> exploratory laparotomy/intestinal anastomosis and abdominal closure. 8/10>> Extubated 8/13 Transfer to [facility] 8/20>> CT A/P with fuid collection in R mid-abdomen with c/f anastomotic leak. 8/21>>  Back to ICU for resp distress 8/22>> CT-guided drain placement (RLQ) for fluid assessment, abscess vs. Anastomotic leak 8/23>> Taken back to OR for ex-lap/concern for anastomotic leak; extensive LOA with findings of feculent peritonitis with +leak; enterotomies requiring additional SBR and end ileostomy. 8/25>> Made DNR after palliative meeting 8/26>> Extubated. Right thoracentesis with removal of 600 cc 8/28>> respiratory distress, urgent thoracentesis removal of 600 cc, diuresis with Lasix, came off BiPAP 8/31>> transferred to [facility].  Significant studies: 7/29>> CT angio chest: No PE, large areas of consolidation with air bronchogram involving right lower/right middle lobe. 7/30>> CT head: No acute intracranial abnormality. 7/30>> CT chest/abdomen/pelvis: Worsening right pleural effusion, panchamber cardiomegaly 7/31>> Echo: EF 25-30%, changes consistent with chronic Takotsubo cardiomyopathy.  RV Eduardo Osier function is mildly reduced. 8/02>> CT head: No acute intracranial process 8/02>> Spot EEG: No seizures. 8/06>> CT abdomen pelvis: Interval development of pneumatosis involving cecum/ascending colon-concerning for ischemic colitis.  Associated small bowel obstruction. 8/14>> Limited echo: EF 60-65%.  RV systolic function moderately reduced. 8/17>> left upper extremity Doppler: No DVT. 8/20>> CT abdomen/pelvis: Surgical changes of right hemicolectomy-extraluminal collection of gas/fluid in the right mid abdomen.  Extensive infiltrative changes within the mesentery of the right abdomen.     Significant microbiology data: 7/28>> blood culture: No growth 7/29>> urine culture: No growth 7/30>> BAL culture: No growth 7/31>> pleural fluid culture: No growth 8/18>> blood culture: No growth 8/19>> urine culture: Enterococcus faecalis 8/21>> tracheal aspirate: No growth 8/21>> blood culture: No growth 8/22>> abscess abdomen: Enterococcus/Klebsiella 8/26>> pleural fluid culture: No  growth  Consults: PCCM, cardiology, palliative care, general surgery, interventional radiology, neurology  Subjective: Appetite poor but no  major issues overnight.  Objective: Vitals: Blood pressure 121/64, pulse 83, temperature 98.1 F (36.7 C), temperature source Oral, resp. rate 19, height 5' (1.524 m), weight 72.3 kg, SpO2 98 %.   Exam: Gen Exam:Alert awake-not in any distress HEENT:atraumatic, normocephalic Chest: B/L clear to auscultation anteriorly CVS:S1S2 regular Abdomen:soft non tender, non distended Extremities:no edema Neurology: Non focal Skin: no rash   Pertinent Labs/Radiology:    Latest Ref Rng & Units 02/06/2022    5:00 AM 02/05/2022    9:40 AM 02/04/2022    6:11 AM  CBC  WBC 4.0 - 10.5 K/uL 9.0  9.0  9.0   Hemoglobin 12.0 - 15.0 g/dL 8.8  8.9  9.0   Hematocrit 36.0 - 46.0 % 27.8  28.5  29.6   Platelets 150 - 400 K/uL 225  223  210     Lab Results  Component Value Date   NA 138 02/06/2022   K 3.9 02/06/2022   CL 94 (L) 02/06/2022   CO2 38 (H) 02/06/2022      Assessment/Plan: Acute hypoxic respiratory failure Right lobar PNA Parapneumonic effusion-s/p chest tube placement-removed 8/1 No major issues overnight-on 2 L of oxygen-continue to mobilize/encourage incentive spirometry.  No longer on antibiotics.    Ischemic bowel with perforation-s/p right colectomy and exploratory laparotomy on 8/7. S/p exploratory laparotomy-bowel anastomosis on 8/9 Complicated by anastomotic leak with peritonitis-s/p exploratory laparotomy, extensive LOA, resection of distal small bowel, and ileocolonic anastomosis, creation of end ileostomy on 8/23 Diet advance but she has very poor appetite-Remeron started.  Remains on TNA.  General surgery following and directing care.    Septic shock with bowel perforation and intra-abdominal abscess:  Sepsis physiology has resolved-completed a course of IV antibiotics.  S/p CT-guided drain placement by IR on 8/22.  PEA arrest Felt  to be due to respiratory issues-continue telemetry monitoring.  Initial echo showed changes consistent with Takotsubo cardiomyopathy-thankfully a limited echo few days later showed normal EF.  Takotsubo cardiomyopathy with recovered EF Acute on chronic HFpEF Volume overload due to third spacing/hypoalbuminemia Continues to have volume overload-on IV Lasix-follow closely.  Continue beta-blocker.  Persistent atrial fibrillation:  On beta-blocker-anticoagulated with Lovenox-once not thought to require any surgical procedures-we will switch to oral agent.  History of moderate pulmonary hypertension Will need close outpatient follow-up with pulmonology and cardiology.  HTN BP stable on metoprolol.  History of bronchial asthma: Not in exacerbation-continue bronchodilators.  Acute metabolic encephalopathy Due to septic shock/peritonitis/hypoxemia.  Resolved-awake and alert this morning.  EEG negative for seizures.  Normocytic anemia Due to critical illness-transfuse if significant drop.  No evidence of blood loss.  History of breast cancer-s/p right mastectomy on 01/24/2017-completed adjuvant radiation November 2018 resume letrozole when able.  Severe debility/deconditioning PT/OT Probably SNF on discharge.  Nutrition Status: Nutrition Problem: Severe Malnutrition Etiology: acute illness Signs/Symptoms: mild fat depletion, mild muscle depletion, moderate muscle depletion, severe fat depletion Interventions: TPN, Boost Breeze, Refer to RD note for recommendations   Pressure Ulcer: Pressure Injury 01/20/2022 Nose Left;Right Stage 2 -  Partial thickness loss of dermis presenting as a shallow open injury with a red, pink wound bed without slough. raw and pink area with scabs very painful (Active)  01/15/2022 1147  Location: Nose  Location Orientation: Left;Right  Staging: Stage 2 -  Partial thickness loss of dermis presenting as a shallow open injury with a red, pink wound bed without  slough.  Wound Description (Comments): raw and pink area with scabs very painful  Present on Admission:  No  Dressing Type None 02/05/22 2018   Obesity: Estimated body mass index is 31.13 kg/m as calculated from the following:   Height as of this encounter: 5' (1.524 m).   Weight as of this encounter: 72.3 kg.   Code status:   Code Status: DNR   DVT Prophylaxis: Place and maintain sequential compression device Start: 01/24/2022 1701 SCDs Start: 01/23/2022 8676 therapeutic Lovenox  Family Communication: Son-Todd-[phone]-updated over the phone 9/1   Disposition Plan: Status is: Inpatient Remains inpatient appropriate because: Severity of illness-diet being advanced-on IV diuretics for volume overload.   Planned Discharge Destination: CIR evaluation in progress.   Diet: Diet Order             DIET SOFT Room service appropriate? Yes; Fluid consistency: Thin  Diet effective now                     Antimicrobial agents: Anti-infectives (From admission, onward)    Start     Dose/Rate Route Frequency Ordered Stop   01/26/22 1400  piperacillin-tazobactam (ZOSYN) IVPB 3.375 g        3.375 g 12.5 mL/hr over 240 Minutes Intravenous Every 8 hours 01/26/22 0945 02/02/22 0150   01/05/2022 1030  vancomycin (VANCOREADY) IVPB 750 mg/150 mL  Status:  Discontinued        750 mg 150 mL/hr over 60 Minutes Intravenous Every 24 hours 01/08/2022 0937 01/28/22 1520   02/03/2022 1000  vancomycin (VANCOREADY) IVPB 1500 mg/300 mL  Status:  Discontinued        1,500 mg 150 mL/hr over 120 Minutes Intravenous Every 48 hours 01/23/22 0909 02/03/2022 0937   01/23/22 1030  vancomycin (VANCOREADY) IVPB 1250 mg/250 mL        1,250 mg 166.7 mL/hr over 90 Minutes Intravenous  Once 01/23/22 0907 01/23/22 1259   01/23/22 1000  metroNIDAZOLE (FLAGYL) IVPB 500 mg  Status:  Discontinued        500 mg 100 mL/hr over 60 Minutes Intravenous Every 12 hours 01/23/22 0839 01/26/22 0945   01/23/22 1000  ceFEPIme  (MAXIPIME) 2 g in sodium chloride 0.9 % 100 mL IVPB  Status:  Discontinued        2 g 200 mL/hr over 30 Minutes Intravenous Every 12 hours 01/23/22 0901 01/26/22 0945   01/06/2022 1400  micafungin (MYCAMINE) 100 mg in sodium chloride 0.9 % 100 mL IVPB  Status:  Discontinued        100 mg 105 mL/hr over 1 Hours Intravenous Every 24 hours 01/06/2022 1302 01/15/22 1211   01/10/22 1515  piperacillin-tazobactam (ZOSYN) IVPB 3.375 g        3.375 g 12.5 mL/hr over 240 Minutes Intravenous Every 8 hours 01/10/22 1427 01/23/2022 2359   01/04/22 2000  vancomycin (VANCOCIN) IVPB 1000 mg/200 mL premix  Status:  Discontinued        1,000 mg 200 mL/hr over 60 Minutes Intravenous Every 24 hours 01/03/22 1910 01/05/22 1321   01/03/22 2200  ceFEPIme (MAXIPIME) 2 g in sodium chloride 0.9 % 100 mL IVPB        2 g 200 mL/hr over 30 Minutes Intravenous Every 12 hours 01/03/22 1910 01/08/22 2209   01/03/22 1930  vancomycin (VANCOREADY) IVPB 1250 mg/250 mL        1,250 mg 166.7 mL/hr over 90 Minutes Intravenous  Once 01/03/22 1910 01/03/22 2219   01/02/22 2100  azithromycin (ZITHROMAX) 500 mg in sodium chloride 0.9 % 250 mL IVPB  500 mg 250 mL/hr  Intravenous Every 24 hours 12/28/2021 2248 01/06/22 2149   01/02/22 2000  cefTRIAXone (ROCEPHIN) 2 g in sodium chloride 0.9 % 100 mL IVPB  Status:  Discontinued        2 g 200 mL/hr over 30 Minutes Intravenous Every 24 hours 12/11/2021 2248 01/03/22 1733   01/03/2022 2030  cefTRIAXone (ROCEPHIN) 1 g in sodium chloride 0.9 % 100 mL IVPB        1 g 200 mL/hr over 30 Minutes Intravenous  Once 12/21/2021 2025 12/26/2021 2141   12/23/2021 2030  azithromycin (ZITHROMAX) 500 mg in sodium chloride 0.9 % 250 mL IVPB        500 mg 250 mL/hr over 60 Minutes Intravenous  Once 12/11/2021 2025 12/16/2021 2312        MEDICATIONS: Scheduled Meds:  acetaminophen  1,000 mg Oral Q8H   arformoterol  15 mcg Nebulization BID   budesonide (PULMICORT) nebulizer solution  0.25 mg Nebulization BID    enoxaparin (LOVENOX) injection  100 mg Subcutaneous Q24H   feeding supplement  1 Container Oral TID BM   furosemide  40 mg Intravenous Q12H   lidocaine  10 mL Other Once   metoprolol succinate  50 mg Oral Daily   mirtazapine  7.5 mg Oral QHS   mupirocin ointment   Nasal BID   pantoprazole (PROTONIX) IV  40 mg Intravenous Q24H   revefenacin  175 mcg Nebulization Daily   Continuous Infusions:  sodium chloride 10 mL/hr at 02/03/22 1900   sodium chloride Stopped (01/30/22 2126)   TPN ADULT (ION) 65 mL/hr at 02/06/22 0558   TPN ADULT (ION)     PRN Meds:.guaiFENesin, haloperidol lactate, levalbuterol, lip balm, methocarbamol, ondansetron (ZOFRAN) IV, oxyCODONE, sodium chloride, sodium chloride, sodium chloride flush, white petrolatum   I have personally reviewed following labs and imaging studies  LABORATORY DATA: CBC: Recent Labs  Lab 02/02/22 0615 02/03/22 0549 02/04/22 0611 02/05/22 0940 02/06/22 0500  WBC 10.2 11.2* 9.0 9.0 9.0  NEUTROABS  --   --   --  6.0 5.8  HGB 9.7* 9.2* 9.0* 8.9* 8.8*  HCT 31.6* 30.0* 29.6* 28.5* 27.8*  MCV 99.7 100.7* 102.4* 100.0 98.2  PLT 233 232 210 223 225     Basic Metabolic Panel: Recent Labs  Lab 02/01/22 0407 02/02/22 0929 02/02/22 1455 02/03/22 0549 02/04/22 0611 02/05/22 0650 02/06/22 0500  NA 146* 140 143 140 140 136 138  K 4.0 6.1* 4.1 4.1 4.4 4.1 3.9  CL 105 97* 99 97* 96* 91* 94*  CO2 36* 38* 36* 36* 39* 39* 38*  GLUCOSE 118* 499* 111* 107* 117* 124* 121*  BUN 22 25* 25* 26* 26* 29* 27*  CREATININE 0.51 0.54 0.58 0.51 0.45 0.49 0.53  CALCIUM 8.8* 8.5* 8.9 9.0 9.2 9.3 9.4  MG 2.0 2.5*  --  1.7 2.1 1.9 1.7  PHOS 3.8  --   --   --  3.6  --   --      GFR: Estimated Creatinine Clearance: 51.4 mL/min (by C-G formula based on SCr of 0.53 mg/dL).  Liver Function Tests: Recent Labs  Lab 02/01/22 0407 02/03/22 0549  AST 19 23  ALT 19 14  ALKPHOS 78 92  BILITOT 0.8 0.4  PROT 5.6* 5.9*  ALBUMIN 2.0* 2.0*    No  results for input(s): "LIPASE", "AMYLASE" in the last 168 hours. No results for input(s): "AMMONIA" in the last 168 hours.  Coagulation Profile: No results for input(s): "INR", "PROTIME"  in the last 168 hours.  Cardiac Enzymes: No results for input(s): "CKTOTAL", "CKMB", "CKMBINDEX", "TROPONINI" in the last 168 hours.  BNP (last 3 results) No results for input(s): "PROBNP" in the last 8760 hours.  Lipid Profile: Recent Labs    02/04/22 0611  TRIG 139     Thyroid Function Tests: No results for input(s): "TSH", "T4TOTAL", "FREET4", "T3FREE", "THYROIDAB" in the last 72 hours.  Anemia Panel: No results for input(s): "VITAMINB12", "FOLATE", "FERRITIN", "TIBC", "IRON", "RETICCTPCT" in the last 72 hours.  Urine analysis:    Component Value Date/Time   COLORURINE YELLOW 01/23/2022 1135   APPEARANCEUR CLEAR 01/23/2022 1135   LABSPEC <1.005 (L) 01/23/2022 1135   PHURINE 7.0 01/23/2022 1135   GLUCOSEU NEGATIVE 01/23/2022 1135   HGBUR TRACE (A) 01/23/2022 1135   BILIRUBINUR NEGATIVE 01/23/2022 1135   KETONESUR NEGATIVE 01/23/2022 1135   PROTEINUR NEGATIVE 01/23/2022 1135   UROBILINOGEN 0.2 01/22/2013 1345   NITRITE NEGATIVE 01/23/2022 1135   LEUKOCYTESUR LARGE (A) 01/23/2022 1135    Sepsis Labs: Lactic Acid, Venous    Component Value Date/Time   LATICACIDVEN 1.0 01/26/2022 0440    MICROBIOLOGY: Recent Results (from the past 240 hour(s))  Culture, body fluid w Gram Stain-bottle     Status: None   Collection Time: 01/30/22 10:04 AM   Specimen: Pleura  Result Value Ref Range Status   Specimen Description PLEURAL  Final   Special Requests NONE  Final   Culture   Final    NO GROWTH 5 DAYS Performed at [facility], [place] [address], [place], [place] [address]    Report Status 02/04/2022 FINAL  Final  Gram stain     Status: None   Collection Time: 01/30/22 10:04 AM   Specimen: Pleura  Result Value Ref Range Status   Specimen Description PLEURAL  Final   Special  Requests NONE  Final   Gram Stain   Final    RARE WBC PRESENT, PREDOMINANTLY MONONUCLEAR NO ORGANISMS SEEN Performed at [facility], [place] [address] [place], [place] [address]    Report Status 01/30/2022 FINAL  Final    RADIOLOGY STUDIES/RESULTS: No results found.   LOS: 36 days   Oren Binet, MD  [facility]    To contact the attending provider between 7A-7P or the covering provider during after hours 7P-7A, please log into the web site [email or web address] and access using universal [place] password for that web site. If you do not have the password, please call the hospital operator.  02/06/2022, 10:51 AM

## 2022-02-06 NOTE — Progress Notes (Signed)
[facility] Progress Note  10 Days Post-Op  Subjective: Pain better.  Tolerating some PO with some protein shakes as well. No n/v.  Objective: Vital signs in last 24 hours: Temp:  [98.1 F (36.7 C)-99 F (37.2 C)] 98.1 F (36.7 C) (09/02 0840) Pulse Rate:  [83-114] 83 (09/02 0914) Resp:  [19-24] 19 (09/02 0914) BP: (106-175)/(55-77) 121/64 (09/02 0840) SpO2:  [91 %-98 %] 98 % (09/02 0914) FiO2 (%):  [28 %] 28 % (09/02 0752) Weight:  [72.3 kg] 72.3 kg (09/02 0359) Last BM Date : 01/26/22  Intake/Output from previous day: 09/01 0701 - 09/02 0700 In: 1730.7 [P.O.:300; I.V.:1430.7] Out: 1750 [Urine:1550; Stool:200] Intake/Output this shift: No intake/output data recorded.  PE: Gen:  Alert Abd: Soft, appropriately tender, nondistended; ostomy viable with thin bilious fluid in bag.  VAC change done 9/1, photo from that day     Lab Results:  Recent Labs    02/05/22 0940 02/06/22 0500  WBC 9.0 9.0  HGB 8.9* 8.8*  HCT 28.5* 27.8*  PLT 223 225   BMET Recent Labs    02/05/22 0650 02/06/22 0500  NA 136 138  K 4.1 3.9  CL 91* 94*  CO2 39* 38*  GLUCOSE 124* 121*  BUN 29* 27*  CREATININE 0.49 0.53  CALCIUM 9.3 9.4   PT/INR No results for input(s): "LABPROT", "INR" in the last 72 hours. CMP     Component Value Date/Time   NA 138 02/06/2022 0500   NA 139 11/18/2021 1432   NA 134 (L) 04/25/2017 1216   K 3.9 02/06/2022 0500   K 4.3 04/25/2017 1216   CL 94 (L) 02/06/2022 0500   CO2 38 (H) 02/06/2022 0500   CO2 29 04/25/2017 1216   GLUCOSE 121 (H) 02/06/2022 0500   GLUCOSE 85 04/25/2017 1216   BUN 27 (H) 02/06/2022 0500   BUN 10 11/18/2021 1432   BUN 14.4 04/25/2017 1216   CREATININE 0.53 02/06/2022 0500   CREATININE 0.72 01/17/2018 1347   CREATININE 0.7 04/25/2017 1216   CALCIUM 9.4 02/06/2022 0500   CALCIUM 10.0 04/25/2017 1216   PROT 5.9 (L) 02/03/2022 0549   PROT 7.2 04/25/2017 1216   ALBUMIN 2.0 (L) 02/03/2022 0549   ALBUMIN 3.7  04/25/2017 1216   AST 23 02/03/2022 0549   AST 20 01/17/2018 1347   AST 21 04/25/2017 1216   ALT 14 02/03/2022 0549   ALT 10 01/17/2018 1347   ALT 10 04/25/2017 1216   ALKPHOS 92 02/03/2022 0549   ALKPHOS 75 04/25/2017 1216   BILITOT 0.4 02/03/2022 0549   BILITOT 0.5 01/17/2018 1347   BILITOT 0.65 04/25/2017 1216   GFRNONAA >60 02/06/2022 0500   GFRNONAA >60 01/17/2018 1347   GFRAA 90 04/24/2020 1235   GFRAA >60 01/17/2018 1347   Lipase     Component Value Date/Time   LIPASE 77 07/28/2008 1630       Studies/Results: No results found.  Anti-infectives: Anti-infectives (From admission, onward)    Start     Dose/Rate Route Frequency Ordered Stop   01/26/22 1400  piperacillin-tazobactam (ZOSYN) IVPB 3.375 g        3.375 g 12.5 mL/hr over 240 Minutes Intravenous Every 8 hours 01/26/22 0945 02/02/22 0150   01/26/2022 1030  vancomycin (VANCOREADY) IVPB 750 mg/150 mL  Status:  Discontinued        750 mg 150 mL/hr over 60 Minutes Intravenous Every 24 hours 01/31/2022 0937 01/28/22 1520   01/05/2022 1000  vancomycin (VANCOREADY) IVPB 1500  mg/300 mL  Status:  Discontinued        1,500 mg 150 mL/hr over 120 Minutes Intravenous Every 48 hours 01/23/22 0909 01/15/2022 0937   01/23/22 1030  vancomycin (VANCOREADY) IVPB 1250 mg/250 mL        1,250 mg 166.7 mL/hr over 90 Minutes Intravenous  Once 01/23/22 0907 01/23/22 1259   01/23/22 1000  metroNIDAZOLE (FLAGYL) IVPB 500 mg  Status:  Discontinued        500 mg 100 mL/hr over 60 Minutes Intravenous Every 12 hours 01/23/22 0839 01/26/22 0945   01/23/22 1000  ceFEPIme (MAXIPIME) 2 g in sodium chloride 0.9 % 100 mL IVPB  Status:  Discontinued        2 g 200 mL/hr over 30 Minutes Intravenous Every 12 hours 01/23/22 0901 01/26/22 0945   01/09/2022 1400  micafungin (MYCAMINE) 100 mg in sodium chloride 0.9 % 100 mL IVPB  Status:  Discontinued        100 mg 105 mL/hr over 1 Hours Intravenous Every 24 hours 01/29/2022 1302 01/15/22 1211   01/10/22  1515  piperacillin-tazobactam (ZOSYN) IVPB 3.375 g        3.375 g 12.5 mL/hr over 240 Minutes Intravenous Every 8 hours 01/10/22 1427 01/20/2022 2359   01/04/22 2000  vancomycin (VANCOCIN) IVPB 1000 mg/200 mL premix  Status:  Discontinued        1,000 mg 200 mL/hr over 60 Minutes Intravenous Every 24 hours 01/03/22 1910 01/05/22 1321   01/03/22 2200  ceFEPIme (MAXIPIME) 2 g in sodium chloride 0.9 % 100 mL IVPB        2 g 200 mL/hr over 30 Minutes Intravenous Every 12 hours 01/03/22 1910 01/08/22 2209   01/03/22 1930  vancomycin (VANCOREADY) IVPB 1250 mg/250 mL        1,250 mg 166.7 mL/hr over 90 Minutes Intravenous  Once 01/03/22 1910 01/03/22 2219   01/02/22 2100  azithromycin (ZITHROMAX) 500 mg in sodium chloride 0.9 % 250 mL IVPB        500 mg 250 mL/hr  Intravenous Every 24 hours 12/07/2021 2248 01/06/22 2149   01/02/22 2000  cefTRIAXone (ROCEPHIN) 2 g in sodium chloride 0.9 % 100 mL IVPB  Status:  Discontinued        2 g 200 mL/hr over 30 Minutes Intravenous Every 24 hours 12/18/2021 2248 01/03/22 1733   12/06/2021 2030  cefTRIAXone (ROCEPHIN) 1 g in sodium chloride 0.9 % 100 mL IVPB        1 g 200 mL/hr over 30 Minutes Intravenous  Once 12/10/2021 2025 12/23/2021 2141   12/19/2021 2030  azithromycin (ZITHROMAX) 500 mg in sodium chloride 0.9 % 250 mL IVPB        500 mg 250 mL/hr over 60 Minutes Intravenous  Once 12/16/2021 2025 12/31/2021 2312        Assessment/Plan POD #26/24, S/p exploratory laparotomy,  R colectomy, abthera placement 01/12/2022 Dr. Rosendo Gros S/p exploratory laparotomy, intestinal anastomosis, abdominal closure 01/08/2022 Dr. 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 Redmond Pulling - Ostomy with some thin bilious output. Continue TPN for now.  -soft diet, breeze TID.  Continue TNA until better oral intake - WOC consult for new ileostomy and VAC - Vac  MWF - zosyn x5 days postop (end date 8/28), WBC normal - pain improved with adjustment in pain meds.  Given she is AF VSS, will continue to hold on  a scan at this time.  -likely remove VAC on Monday 9/4 due to shallowness of wound.   FEN: soft, breeze, TNA ID: Zosyn (stop date 8/14), cefepime/flagyl 8/19 >>8/21, Vanc/zosyn 8/21 --> 8/28 VTE: SCD's, hold DOAC/ heparin gtt    Below per TRH/CCM -- Septic shock due to above Acute hypoxic respiratory failure - per CCM, extubated on 8/26 Cardiac arrest 7/30, 7 mins CPR Hx cardiomyopathy COPD Afib, permanent CAP DNR   LOS: 43 days   Nadeen Landau, MD [facility], [place]

## 2022-02-07 DIAGNOSIS — J9601 Acute respiratory failure with hypoxia: Secondary | ICD-10-CM | POA: Diagnosis not present

## 2022-02-07 DIAGNOSIS — J189 Pneumonia, unspecified organism: Secondary | ICD-10-CM | POA: Diagnosis not present

## 2022-02-07 DIAGNOSIS — I4811 Longstanding persistent atrial fibrillation: Secondary | ICD-10-CM | POA: Diagnosis not present

## 2022-02-07 DIAGNOSIS — I469 Cardiac arrest, cause unspecified: Secondary | ICD-10-CM | POA: Diagnosis not present

## 2022-02-07 DIAGNOSIS — K559 Vascular disorder of intestine, unspecified: Secondary | ICD-10-CM | POA: Diagnosis not present

## 2022-02-07 LAB — CBC WITH DIFFERENTIAL/PLATELET
Abs Immature Granulocytes: 0.24 10*3/uL — ABNORMAL HIGH (ref 0.00–0.07)
Basophils Absolute: 0.1 10*3/uL (ref 0.0–0.1)
Basophils Relative: 1 %
Eosinophils Absolute: 0.2 10*3/uL (ref 0.0–0.5)
Eosinophils Relative: 2 %
HCT: 28.4 % — ABNORMAL LOW (ref 36.0–46.0)
Hemoglobin: 8.8 g/dL — ABNORMAL LOW (ref 12.0–15.0)
Immature Granulocytes: 3 %
Lymphocytes Relative: 16 %
Lymphs Abs: 1.5 10*3/uL (ref 0.7–4.0)
MCH: 31.1 pg (ref 26.0–34.0)
MCHC: 31 g/dL (ref 30.0–36.0)
MCV: 100.4 fL — ABNORMAL HIGH (ref 80.0–100.0)
Monocytes Absolute: 1.3 10*3/uL — ABNORMAL HIGH (ref 0.1–1.0)
Monocytes Relative: 14 %
Neutro Abs: 5.9 10*3/uL (ref 1.7–7.7)
Neutrophils Relative %: 64 %
Platelets: 243 10*3/uL (ref 150–400)
RBC: 2.83 MIL/uL — ABNORMAL LOW (ref 3.87–5.11)
RDW: 19 % — ABNORMAL HIGH (ref 11.5–15.5)
WBC: 9.2 10*3/uL (ref 4.0–10.5)
nRBC: 0.4 % — ABNORMAL HIGH (ref 0.0–0.2)

## 2022-02-07 LAB — GLUCOSE, CAPILLARY
Glucose-Capillary: 124 mg/dL — ABNORMAL HIGH (ref 70–99)
Glucose-Capillary: 147 mg/dL — ABNORMAL HIGH (ref 70–99)
Glucose-Capillary: 153 mg/dL — ABNORMAL HIGH (ref 70–99)
Glucose-Capillary: 155 mg/dL — ABNORMAL HIGH (ref 70–99)
Glucose-Capillary: 159 mg/dL — ABNORMAL HIGH (ref 70–99)

## 2022-02-07 LAB — BASIC METABOLIC PANEL
Anion gap: 9 (ref 5–15)
BUN: 26 mg/dL — ABNORMAL HIGH (ref 8–23)
CO2: 36 mmol/L — ABNORMAL HIGH (ref 22–32)
Calcium: 9.2 mg/dL (ref 8.9–10.3)
Chloride: 93 mmol/L — ABNORMAL LOW (ref 98–111)
Creatinine, Ser: 0.57 mg/dL (ref 0.44–1.00)
GFR, Estimated: >60 mL/min (ref >60)
Glucose, Bld: 135 mg/dL — ABNORMAL HIGH (ref 70–99)
Potassium: 3.7 mmol/L (ref 3.5–5.1)
Sodium: 138 mmol/L (ref 135–145)

## 2022-02-07 LAB — MAGNESIUM: Magnesium: 1.7 mg/dL (ref 1.7–2.4)

## 2022-02-07 LAB — BRAIN NATRIURETIC PEPTIDE: B Natriuretic Peptide: 361.8 pg/mL — ABNORMAL HIGH (ref 0.0–100.0)

## 2022-02-07 MED ORDER — MAGNESIUM SULFATE 50 % IJ SOLN
3.0000 g | Freq: Once | INTRAVENOUS | Status: AC
  Administered 2022-02-07: 3 g via INTRAVENOUS
  Filled 2022-02-07: qty 6

## 2022-02-07 MED ORDER — TRACE MINERALS CU-MN-SE-ZN 300-55-60-3000 MCG/ML IV SOLN
INTRAVENOUS | Status: AC
  Filled 2022-02-07: qty 696.8

## 2022-02-07 NOTE — Progress Notes (Signed)
Palliative Medicine Progress Note   Patient Name: [name]       Date: 02/07/2022 DOB: 1940/12/25  Age: 81 y.o. MRN#: [id number] Attending Physician: Jonetta Osgood, MD Primary Care Physician: [facility] [place] Admit Date: 01/03/2022    HPI/Patient Profile: 81 y.o. female  with past medical history of breast cancer, atrial fibrillation, and anemia who was admitted admitted on 12/29/2021 with community-acquired pneumonia and acute respiratory failure.  On 7/30 she went into cardiac arrest and ROSC was achieved after 7 minutes.  Chest tube was placed for parapneumonic effusion, and she was extubated on 8/2.  Sent out of ICU on 8/5.  She underwent exploratory laparotomy on 8/7 for ischemic colitis with perforation and is s/p right colectomy.  She underwent repeat laparotomy on 8/9 with intestinal anastomosis and abdominal closure.  She was extubated 8/10 and transferred to the floor.  CT abdomen 8/20 showed right-sided abdominal fluid collection in which IR was consulted for drainage.  Rapid response was called 8/21 for respiratory distress in which she was intubated and transferred back to ICU. Patient found to have anastomotic leak and taken back to OR 8/23 for resection of  and creation of end ileostomy. Extubated 8/26.   Palliative medicine was consulted 8/21 for goals of care in the setting of prolonged hospital stay, multiple complications, and complex medical history.   Subjective: Chart reviewed. Note that patient developed shortness of breath and wheezing yesterday. Patient refused BiPAP. Treated with bronchodilator neb and extra IV lasix. CXR showed right pleural effusion.   I went to see patient at bedside.  She reports feeling much better than yesterday.   Discussed that she seems to have improved with extra IV Lasix.  Discussed that per PCCM, thoracentesis would not be beneficial.  Confirmed she would not want BiPAP.  Reviewed that unfortunately there are limited/no options if her condition declines.   Discussed the importance of continued conversation with patient,  family and the medical team regarding overall plan of care.    Objective:  Physical Exam Vitals reviewed.  Constitutional:      Appearance: She is ill-appearing.  Cardiovascular:     Rate and Rhythm: Normal rate.  Pulmonary:     Effort: Pulmonary effort is normal. Tachypnea present.  Neurological:     Mental Status: She  is alert and oriented to person, place, and time.     Motor: Weakness present.             Vital Signs: BP 119/61 (BP Location: Right Leg)   Pulse (!) 107   Temp 98.1 F (36.7 C) (Oral)   Resp 18   Ht 5' (1.524 m)   Wt 76.5 kg   SpO2 100%   BMI 32.94 kg/m  SpO2: SpO2: 100 % O2 Device: O2 Device: Nasal Cannula O2 Flow Rate: O2 Flow Rate (L/min): 4 L/min    LBM: Last BM Date : 01/26/22     Palliative Medicine Assessment & Plan   Assessment: Principal Problem:   CAP (community acquired pneumonia) Active Problems:   Hypertensive heart disease without CHF   Hypotension   A-fib (HCC)   Lactic acid increased   Cardiac arrest ([place])   Acute respiratory failure with hypoxia (HCC)   Pneumonia of right lower lobe due to infectious organism   Takotsubo cardiomyopathy   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   MDM - moderate   Lavena Bullion, NP   Please contact Palliative Medicine Team phone at [phone] for questions and concerns.  For individual providers, please see AMION.

## 2022-02-07 NOTE — Progress Notes (Signed)
   02/07/22 1520  Assess: MEWS Score  Temp 98.4 F (36.9 C)  BP 120/62  MAP (mmHg) 77  Pulse Rate 100  ECG Heart Rate (!) 103  Resp (!) 28  SpO2 100 %  O2 Device Nasal Cannula  O2 Flow Rate (L/min) 6 L/min  Assess: MEWS Score  MEWS Temp 0  MEWS Systolic 0  MEWS Pulse 1  MEWS RR 2  MEWS LOC 0  MEWS Score 3  MEWS Score Color Yellow  Assess: if the MEWS score is Yellow or Red  Were vital signs taken at a resting state? Yes  Focused Assessment No change from prior assessment  Does the patient meet 2 or more of the SIRS criteria? No  Does the patient have a confirmed or suspected source of infection? No  Provider and Rapid Response Notified? No  MEWS guidelines implemented *See Row Information* Yes  Take Vital Signs  Increase Vital Sign Frequency  Yellow: Q 2hr X 2 then Q 4hr X 2, if remains yellow, continue Q 4hrs  Escalate  MEWS: Escalate Yellow: discuss with charge nurse/RN and consider discussing with provider and RRT  Assess: SIRS CRITERIA  SIRS Temperature  0  SIRS Pulse 1  SIRS Respirations  1  SIRS WBC 1  SIRS Score Sum  3   Patient with yellow MEWS due to RR and HR. D/W Dr. Sloan Leiter, no need for rapid response notification. Yellow MEWS vitals implemented per protocol.

## 2022-02-07 NOTE — Progress Notes (Signed)
PROGRESS NOTE        PATIENT DETAILS Name: [name] Age: 81 y.o. Sex: female Date of Birth: June 07, 1941 Admit Date: 12/21/2021 Admitting Physician Kayleen Memos, DO [id number], [place] [facility] [place]  Brief Summary: Patient is a 81 y.o.  female with history of persistent atrial fibrillation, HTN, chronic HFpEF, breast cancer on letrozole, pulmonary hypertension, asthma-who presented to the hospital on 7/28 with cough/shortness of breath-she was found to have acute hypoxic respiratory failure due to right lobar PNA.  She was started on IV antibiotics and admitted to the hospitalist service.  On 7/30-she sustained a in-hospital cardiac arrest thought to be due to underlying respiratory decline from pneumonia, she was intubated and transferred to the ICU.  CT chest showed a large right-sided pleural effusion-she underwent chest tube placement.  She was stabilized and transferred back to [facility].  Unfortunately-further hospital course was complicated by development of acute abdomen in the setting of ischemic colon with perforation requiring multiple laparotomies and bowel resection.  She remained in the ICU for while she underwent numerous surgical procedures-once stabilized-she was extubated and transferred back to [facility] on 8/31.  See below for further details.  Significant events: 7/28>> admit to [facility]-sepsis with hypoxia due to PNA.  7/30>> PEA arrest.  Transfer to ICU.  CT chest with worsening pleural effusion. 7/31>> Chest tube placed 8/01>> patient removed chest tube.   8/06>> Transfer to [facility] 8/07>> back to ICU-acute abdomen-hypertension-A-fib RVR.  Found to have ischemic colon with contained perforation-underwent exploratory laparotomy and colon resection. 8/09>> exploratory laparotomy/intestinal anastomosis and abdominal closure. 8/10>> Extubated 8/13 Transfer to [facility] 8/20>> CT A/P with fuid collection in R mid-abdomen with c/f anastomotic leak. 8/21>>  Back to ICU for resp distress 8/22>> CT-guided drain placement (RLQ) for fluid assessment, abscess vs. Anastomotic leak 8/23>> Taken back to OR for ex-lap/concern for anastomotic leak; extensive LOA with findings of feculent peritonitis with +leak; enterotomies requiring additional SBR and end ileostomy. 8/25>> Made DNR after palliative meeting 8/26>> Extubated. Right thoracentesis with removal of 600 cc 8/28>> respiratory distress, urgent thoracentesis removal of 600 cc, diuresis with Lasix, came off BiPAP 8/31>> transferred to [facility]. 9/02>> slightly worsening shortness of breath at rest-CXR with persistent right pleural effusion-discussed with PCCM-no need for thoracocentesis.  DNR reconfirmed-patient does not want BiPAP as well.  Significant studies: 7/29>> CT angio chest: No PE, large areas of consolidation with air bronchogram involving right lower/right middle lobe. 7/30>> CT head: No acute intracranial abnormality. 7/30>> CT chest/abdomen/pelvis: Worsening right pleural effusion, panchamber cardiomegaly 7/31>> Echo: EF 25-30%, changes consistent with chronic Takotsubo cardiomyopathy.  RV Eduardo Osier function is mildly reduced. 8/02>> CT head: No acute intracranial process 8/02>> Spot EEG: No seizures. 8/06>> CT abdomen pelvis: Interval development of pneumatosis involving cecum/ascending colon-concerning for ischemic colitis.  Associated small bowel obstruction. 8/14>> Limited echo: EF 60-65%.  RV systolic function moderately reduced. 8/17>> left upper extremity Doppler: No DVT. 8/20>> CT abdomen/pelvis: Surgical changes of right hemicolectomy-extraluminal collection of gas/fluid in the right mid abdomen.  Extensive infiltrative changes within the mesentery of the right abdomen.     Significant microbiology data: 7/28>> blood culture: No growth 7/29>> urine culture: No growth 7/30>> BAL culture: No growth 7/31>> pleural fluid culture: No growth 8/18>> blood culture: No growth 8/19>>  urine culture: Enterococcus faecalis 8/21>> tracheal aspirate: No growth 8/21>> blood culture: No growth  8/22>> abscess abdomen: Enterococcus/Klebsiella 8/26>> pleural fluid culture: No growth  Consults: PCCM, cardiology, palliative care, general surgery, interventional radiology, neurology  Subjective: Stable-claims breathing is better than yesterday.  However now on 4 L of oxygen-previously on 2 L.  Objective: Vitals: Blood pressure 119/61, pulse (!) 107, temperature 98.1 F (36.7 C), temperature source Oral, resp. rate 18, height 5' (1.524 m), weight 76.5 kg, SpO2 100 %.   Exam: Gen Exam:Alert awake-not in any distress HEENT:atraumatic, normocephalic Chest: B/L clear to auscultation anteriorly CVS:S1S2 regular Abdomen:soft non tender, non distended Extremities:++ edema Neurology: Non focal Skin: no rash   Pertinent Labs/Radiology:    Latest Ref Rng & Units 02/07/2022    5:15 AM 02/06/2022    5:00 AM 02/05/2022    9:40 AM  CBC  WBC 4.0 - 10.5 K/uL 9.2  9.0  9.0   Hemoglobin 12.0 - 15.0 g/dL 8.8  8.8  8.9   Hematocrit 36.0 - 46.0 % 28.4  27.8  28.5   Platelets 150 - 400 K/uL 243  225  223     Lab Results  Component Value Date   NA 138 02/07/2022   K 3.7 02/07/2022   CL 93 (L) 02/07/2022   CO2 36 (H) 02/07/2022      Assessment/Plan: Acute hypoxic respiratory failure Right lobar PNA Parapneumonic effusion-s/p chest tube placement-removed 8/1 Some mild worsening of hypoxia yesterday-continue bronchodilators-furosemide dosage increased to 60 mg twice daily.  Continue supportive care.  Not on any antibiotics-as no evidence of recurrent infection.  Ischemic bowel with perforation-s/p right colectomy and exploratory laparotomy on 8/7. S/p exploratory laparotomy-bowel anastomosis on 8/9 Complicated by anastomotic leak with peritonitis-s/p exploratory laparotomy, extensive LOA, resection of distal small bowel, and ileocolonic anastomosis, creation of end ileostomy on  8/23 Diet advance but she has very poor appetite-Remeron started.  Remains on TNA.  General surgery following and directing care.    Septic shock with bowel perforation and intra-abdominal abscess:  Sepsis physiology has resolved-completed a course of IV antibiotics.  S/p CT-guided drain placement by IR on 8/22.  PEA arrest Felt to be due to respiratory issues-continue telemetry monitoring.  Initial echo showed changes consistent with Takotsubo cardiomyopathy-thankfully a limited echo few days later showed normal EF.  Takotsubo cardiomyopathy with recovered EF Acute on chronic HFpEF Volume overload due to third spacing/hypoalbuminemia Continues to have volume overload-Lasix increased to 60 mg twice daily-as she developed some mild respiratory distress yesterday.  Continue beta-blocker.   Persistent atrial fibrillation:  On beta-blocker-anticoagulated with Lovenox-once not thought to require any surgical procedures-we will switch to oral agent.  History of moderate pulmonary hypertension Will need close outpatient follow-up with pulmonology and cardiology.  HTN BP stable on metoprolol.  History of bronchial asthma: Not in exacerbation-continue bronchodilators.  Acute metabolic encephalopathy Due to septic shock/peritonitis/hypoxemia.  Resolved-awake and alert this morning.  EEG negative for seizures.  Normocytic anemia Due to critical illness-transfuse if significant drop.  No evidence of blood loss.  History of breast cancer-s/p right mastectomy on 01/24/2017-completed adjuvant radiation November 2018 resume letrozole when able.  Severe debility/deconditioning PT/OT Probably SNF on discharge.  Palliative care: DNR in place-if she develops worsening respiratory failure-she does not desire BiPAP-understands that apart from transitioning to comfort measures-no further options available.  Nutrition Status: Nutrition Problem: Severe Malnutrition Etiology: acute  illness Signs/Symptoms: mild fat depletion, mild muscle depletion, moderate muscle depletion, severe fat depletion Interventions: TPN, Boost Breeze, Refer to RD note for recommendations   Pressure Ulcer: Pressure Injury 01/26/2022 Nose Left;Right  Stage 2 -  Partial thickness loss of dermis presenting as a shallow open injury with a red, pink wound bed without slough. raw and pink area with scabs very painful (Active)  01/22/2022 1147  Location: Nose  Location Orientation: Left;Right  Staging: Stage 2 -  Partial thickness loss of dermis presenting as a shallow open injury with a red, pink wound bed without slough.  Wound Description (Comments): raw and pink area with scabs very painful  Present on Admission: No  Dressing Type None 02/06/22 2030   Obesity: Estimated body mass index is 32.94 kg/m as calculated from the following:   Height as of this encounter: 5' (1.524 m).   Weight as of this encounter: 76.5 kg.   Code status:   Code Status: DNR   DVT Prophylaxis: Place and maintain sequential compression device Start: 01/30/2022 1701 SCDs Start: 01/19/2022 5701 therapeutic Lovenox  Family Communication: Son-[phone]-updated at bedside on 9/2.   Disposition Plan: Status is: Inpatient Remains inpatient appropriate because: Severity of illness-diet being advanced-on IV diuretics for volume overload.   Planned Discharge Destination: CIR evaluation in progress.   Diet: Diet Order             DIET SOFT Room service appropriate? Yes; Fluid consistency: Thin  Diet effective now                     Antimicrobial agents: Anti-infectives (From admission, onward)    Start     Dose/Rate Route Frequency Ordered Stop   01/26/22 1400  piperacillin-tazobactam (ZOSYN) IVPB 3.375 g        3.375 g 12.5 mL/hr over 240 Minutes Intravenous Every 8 hours 01/26/22 0945 02/02/22 0150   01/23/2022 1030  vancomycin (VANCOREADY) IVPB 750 mg/150 mL  Status:  Discontinued        750  mg 150 mL/hr over 60 Minutes Intravenous Every 24 hours 02/04/2022 0937 01/28/22 1520   01/06/2022 1000  vancomycin (VANCOREADY) IVPB 1500 mg/300 mL  Status:  Discontinued        1,500 mg 150 mL/hr over 120 Minutes Intravenous Every 48 hours 01/23/22 0909 02/04/2022 0937   01/23/22 1030  vancomycin (VANCOREADY) IVPB 1250 mg/250 mL        1,250 mg 166.7 mL/hr over 90 Minutes Intravenous  Once 01/23/22 0907 01/23/22 1259   01/23/22 1000  metroNIDAZOLE (FLAGYL) IVPB 500 mg  Status:  Discontinued        500 mg 100 mL/hr over 60 Minutes Intravenous Every 12 hours 01/23/22 0839 01/26/22 0945   01/23/22 1000  ceFEPIme (MAXIPIME) 2 g in sodium chloride 0.9 % 100 mL IVPB  Status:  Discontinued        2 g 200 mL/hr over 30 Minutes Intravenous Every 12 hours 01/23/22 0901 01/26/22 0945   02/04/2022 1400  micafungin (MYCAMINE) 100 mg in sodium chloride 0.9 % 100 mL IVPB  Status:  Discontinued        100 mg 105 mL/hr over 1 Hours Intravenous Every 24 hours 02/04/2022 1302 01/15/22 1211   01/10/22 1515  piperacillin-tazobactam (ZOSYN) IVPB 3.375 g        3.375 g 12.5 mL/hr over 240 Minutes Intravenous Every 8 hours 01/10/22 1427 01/17/2022 2359   01/04/22 2000  vancomycin (VANCOCIN) IVPB 1000 mg/200 mL premix  Status:  Discontinued        1,000 mg 200 mL/hr over 60 Minutes Intravenous Every 24 hours 01/03/22 1910 01/05/22 1321   01/03/22 2200  ceFEPIme (MAXIPIME) 2 g in  sodium chloride 0.9 % 100 mL IVPB        2 g 200 mL/hr over 30 Minutes Intravenous Every 12 hours 01/03/22 1910 01/08/22 2209   01/03/22 1930  vancomycin (VANCOREADY) IVPB 1250 mg/250 mL        1,250 mg 166.7 mL/hr over 90 Minutes Intravenous  Once 01/03/22 1910 01/03/22 2219   01/02/22 2100  azithromycin (ZITHROMAX) 500 mg in sodium chloride 0.9 % 250 mL IVPB        500 mg 250 mL/hr  Intravenous Every 24 hours 12/20/2021 2248 01/06/22 2149   01/02/22 2000  cefTRIAXone (ROCEPHIN) 2 g in sodium chloride 0.9 % 100 mL IVPB  Status:  Discontinued         2 g 200 mL/hr over 30 Minutes Intravenous Every 24 hours 12/26/2021 2248 01/03/22 1733   12/05/2021 2030  cefTRIAXone (ROCEPHIN) 1 g in sodium chloride 0.9 % 100 mL IVPB        1 g 200 mL/hr over 30 Minutes Intravenous  Once 12/08/2021 2025 12/08/2021 2141   12/06/2021 2030  azithromycin (ZITHROMAX) 500 mg in sodium chloride 0.9 % 250 mL IVPB        500 mg 250 mL/hr over 60 Minutes Intravenous  Once 12/29/2021 2025 12/19/2021 2312        MEDICATIONS: Scheduled Meds:  acetaminophen  1,000 mg Oral Q8H   arformoterol  15 mcg Nebulization BID   budesonide (PULMICORT) nebulizer solution  0.25 mg Nebulization BID   enoxaparin (LOVENOX) injection  100 mg Subcutaneous Q24H   feeding supplement  1 Container Oral TID BM   furosemide  60 mg Intravenous Q12H   lidocaine  10 mL Other Once   metoprolol succinate  50 mg Oral Daily   mirtazapine  7.5 mg Oral QHS   mupirocin ointment   Nasal BID   pantoprazole (PROTONIX) IV  40 mg Intravenous Q24H   revefenacin  175 mcg Nebulization Daily   Continuous Infusions:  sodium chloride 10 mL/hr at 02/03/22 1900   sodium chloride 250 mL (02/07/22 0959)   magnesium sulfate bolus IVPB 3 g (02/07/22 1007)   TPN ADULT (ION) 65 mL/hr at 02/07/22 0542   TPN ADULT (ION)     PRN Meds:.guaiFENesin, haloperidol lactate, levalbuterol, lip balm, methocarbamol, morphine CONCENTRATE, ondansetron (ZOFRAN) IV, oxyCODONE, sodium chloride, sodium chloride, sodium chloride flush, white petrolatum   I have personally reviewed following labs and imaging studies  LABORATORY DATA: CBC: Recent Labs  Lab 02/03/22 0549 02/04/22 0611 02/05/22 0940 02/06/22 0500 02/07/22 0515  WBC 11.2* 9.0 9.0 9.0 9.2  NEUTROABS  --   --  6.0 5.8 5.9  HGB 9.2* 9.0* 8.9* 8.8* 8.8*  HCT 30.0* 29.6* 28.5* 27.8* 28.4*  MCV 100.7* 102.4* 100.0 98.2 100.4*  PLT 232 210 223 225 243     Basic Metabolic Panel: Recent Labs  Lab 02/01/22 0407 02/02/22 0929 02/03/22 0549 02/04/22 0611  02/05/22 0650 02/06/22 0500 02/07/22 0515  NA 146*   < > 140 140 136 138 138  K 4.0   < > 4.1 4.4 4.1 3.9 3.7  CL 105   < > 97* 96* 91* 94* 93*  CO2 36*   < > 36* 39* 39* 38* 36*  GLUCOSE 118*   < > 107* 117* 124* 121* 135*  BUN 22   < > 26* 26* 29* 27* 26*  CREATININE 0.51   < > 0.51 0.45 0.49 0.53 0.57  CALCIUM 8.8*   < > 9.0 9.2 9.3  9.4 9.2  MG 2.0   < > 1.7 2.1 1.9 1.7 1.7  PHOS 3.8  --   --  3.6  --   --   --    < > = values in this interval not displayed.     GFR: Estimated Creatinine Clearance: 53 mL/min (by C-G formula based on SCr of 0.57 mg/dL).  Liver Function Tests: Recent Labs  Lab 02/01/22 0407 02/03/22 0549  AST 19 23  ALT 19 14  ALKPHOS 78 92  BILITOT 0.8 0.4  PROT 5.6* 5.9*  ALBUMIN 2.0* 2.0*    No results for input(s): "LIPASE", "AMYLASE" in the last 168 hours. No results for input(s): "AMMONIA" in the last 168 hours.  Coagulation Profile: No results for input(s): "INR", "PROTIME" in the last 168 hours.  Cardiac Enzymes: No results for input(s): "CKTOTAL", "CKMB", "CKMBINDEX", "TROPONINI" in the last 168 hours.  BNP (last 3 results) No results for input(s): "PROBNP" in the last 8760 hours.  Lipid Profile: No results for input(s): "CHOL", "HDL", "LDLCALC", "TRIG", "CHOLHDL", "LDLDIRECT" in the last 72 hours.   Thyroid Function Tests: No results for input(s): "TSH", "T4TOTAL", "FREET4", "T3FREE", "THYROIDAB" in the last 72 hours.  Anemia Panel: No results for input(s): "VITAMINB12", "FOLATE", "FERRITIN", "TIBC", "IRON", "RETICCTPCT" in the last 72 hours.  Urine analysis:    Component Value Date/Time   COLORURINE YELLOW 01/23/2022 1135   APPEARANCEUR CLEAR 01/23/2022 1135   LABSPEC <1.005 (L) 01/23/2022 1135   PHURINE 7.0 01/23/2022 1135   GLUCOSEU NEGATIVE 01/23/2022 1135   HGBUR TRACE (A) 01/23/2022 1135   BILIRUBINUR NEGATIVE 01/23/2022 1135   KETONESUR NEGATIVE 01/23/2022 1135   PROTEINUR NEGATIVE 01/23/2022 1135   UROBILINOGEN  0.2 01/22/2013 1345   NITRITE NEGATIVE 01/23/2022 1135   LEUKOCYTESUR LARGE (A) 01/23/2022 1135    Sepsis Labs: Lactic Acid, Venous    Component Value Date/Time   LATICACIDVEN 1.0 01/26/2022 0440    MICROBIOLOGY: Recent Results (from the past 240 hour(s))  Culture, body fluid w Gram Stain-bottle     Status: None   Collection Time: 01/30/22 10:04 AM   Specimen: Pleura  Result Value Ref Range Status   Specimen Description PLEURAL  Final   Special Requests NONE  Final   Culture   Final    NO GROWTH 5 DAYS Performed at [facility], [place] [address], [place], [place] [address]    Report Status 02/04/2022 FINAL  Final  Gram stain     Status: None   Collection Time: 01/30/22 10:04 AM   Specimen: Pleura  Result Value Ref Range Status   Specimen Description PLEURAL  Final   Special Requests NONE  Final   Gram Stain   Final    RARE WBC PRESENT, PREDOMINANTLY MONONUCLEAR NO ORGANISMS SEEN Performed at [facility], [place] [address], [place], [place] [address]    Report Status 01/30/2022 FINAL  Final    RADIOLOGY STUDIES/RESULTS: DG CHEST PORT 1 VIEW  Result Date: 02/06/2022 CLINICAL DATA:  Shortness of breath EXAM: PORTABLE CHEST 1 VIEW COMPARISON:  Previous studies including the examination of 02/03/2022 FINDINGS: There is increased density in right mid and right lower lung fields with interval worsening. There is blunting of left lateral CP angle. There are no signs of alveolar pulmonary edema in the visualized lung fields. Evaluation of the right mid and both lower lung fields for infiltrates is limited by effusions. Tip of left PICC line is seen at the junction of superior vena cava and  right atrium. There is no pneumothorax. IMPRESSION: Bilateral pleural effusions, more so on the right side. There is interval increase in amount of right pleural effusion. Possibility of atelectasis/pneumonia in right mid and both lower lung fields is not excluded. There are no new  infiltrates in the visualized lung fields. Electronically Signed   By: Elmer Picker M.D.   On: 02/06/2022 14:56     LOS: 37 days   Oren Binet, MD  [facility]    To contact the attending provider between 7A-7P or the covering provider during after hours 7P-7A, please log into the web site [email or web address] and access using universal [place] password for that web site. If you do not have the password, please call the hospital operator.  02/07/2022, 10:39 AM

## 2022-02-07 NOTE — Progress Notes (Signed)
PHARMACY - TOTAL PARENTERAL NUTRITION CONSULT NOTE  Indication: Intolerance to enteral feeding / anastomotic leak  Patient Measurements: Height: 5' (152.4 cm) Weight: 76.5 kg (168 lb 10.4 oz) IBW/kg (Calculated) : 45.5 TPN AdjBW (KG): 53.7 Body mass index is 32.94 kg/m.  Assessment:  67 YOF admited for CAP causing respiratory failure, had a cardiac arrest after admission and was moved to the ICU.  Chest tube placed for parapneumonic effusion, extubated on 8/2. Sent out of ICU on 8/5. On 8/6 she developed ischemic colitis, initially treated conservatively but transferred back to ICU for worsening shock and confusion 8/7.  Now s/p ex lap, R colectomy for ischemic bowel with perforation. Pt was provided TPN from 8/9-8/15. Pt's diet was advanced and pt ate 25-50% of meals from 8/15-8/20. Diet was changed to CLD on 8/20 for IR placement of drain for anastomotic leak. However, drain was not placed because pt acutely decompensated and was intubated and admitted to ICU and made NPO on 8/21. IR placed drain on 8/22 and drainage was positive for enteric contents confirming anastomotic leak. Pharmacy consulted to manage TPN.  9/1: Spoke with patient to discuss oral intake since advancing diet. Still complains of abdominal pain limiting her oral intake. Doesn't really feel hungry, but not experience nausea/vomiting. 9/2: Pt with minimal po intake - taking some Boost Breeze and applesauce. Noted plan to remove wound VAC on Monday. 9/3: Continues with low po intake - some Boost Breeze and applesauce.  Glucose / Insulin: A1c 4.9% - CBGs < 140. Not on SSI Electrolytes: Mg continues at 1.7 (max in TPN), K down slightly to 3.7, Cl 94 (on max Cl), CO2 36, CoCa 10.8, none in TPN (Ca x Phos ~ 40, goal < 55) Goal K > 4 and Mg > 2 for ileus and Afib Renal: SCr < 1, BUN 26 Hepatic: LFTs / tbili / TG WNL, albumin 2 Intake / Output; MIVF: UOP 2725 ml yesterday, stool 500 mL, Lasix 60 IV BID.  GI Imaging: 8/6 KUB:  Mild gaseous distension favors ileus  8/6 CT: concerning for iscehmic colitis; possible SBO and/or inflammatory ileus  8/7 KUB: diffuse gaseous small bowel and colonic distension 8/20 CT: extraluminal collection of gas and fluid possibly representing an anastomotic leak, postop fluid collection, or abscess GI Surgeries / Procedures:  8/7 Ex lap, colon resection 8/9 Ex lap, intestinal anastomosis, wound closure 8/22 CT-guided anterior RLQ abscess drain placement 8/23 Ex lap with resection of distal small bowel and ileocolonic anastomosis, creation of end ileostomy, and lysis of adhesions   Central access: PICC placed 01/12/22 TPN start date:  01/31/2022-01/19/22, restarted 8/23  Nutritional Goals: RD Estimated Needs Total Energy Estimated Needs: 1700-1900 kcals Total Protein Estimated Needs: 95-115 g Total Fluid Estimated Needs: >/= 1.5 L  Current Nutrition:  TPN  8/30 FLD  Plan:  Continue concentrated TPN at goal rate of 65 ml/hr to provide 104g AA, 234g CHO, 61g lipids, and 1821 kCal, meeting 100% of nutrition needs Electrolytes in TPN: Increase K to 75 mEq/L.  Continue Na 90mq, Ca 0 mEq/L, max Mg at 13m/L, Phos 2284m/L, max CL. Magnesium sulfate 3gm IV today Add standard MVI and trace elements to TPN  Change CBG checks to Q12H to monitor for hypoglycemia Standard TPN labs on Mon/Thurs Daily BMP and Mg while on Lasix scheduled F/u diet advancement and po intake  CarSherlon HandingharmD, BCPS Please see amion for complete clinical pharmacist phone list 02/07/2022, 8:58 AM

## 2022-02-07 NOTE — Progress Notes (Signed)
[facility] Progress Note  11 Days Post-Op  Subjective: Denies pain. States her pain is worse at night. She is tolerating PO - looks like she picked at her breakfast (<25%) and is sipping a boost breeze.   Objective: Vital signs in last 24 hours: Temp:  [97.5 F (36.4 C)-98.2 F (36.8 C)] 98.1 F (36.7 C) (09/03 0725) Pulse Rate:  [96-108] 107 (09/03 0725) Resp:  [18-20] 18 (09/03 0725) BP: (104-128)/(52-61) 119/61 (09/03 0725) SpO2:  [94 %-100 %] 100 % (09/03 0754) FiO2 (%):  [36 %] 36 % (09/03 0754) Weight:  [76.5 kg] 76.5 kg (09/03 0514) Last BM Date : 01/26/22  Intake/Output from previous day: 09/02 0701 - 09/03 0700 In: 1482.1 [P.O.:237; I.V.:1245.1] Out: 3025 [Urine:2575; Stool:450] Intake/Output this shift: No intake/output data recorded.  PE: Gen:  Alert Abd: Soft, appropriately tender, nondistended; ostomy viable with thin enteric contents in ostomy bag.  VAC change done 9/1, photo from that day     Lab Results:  Recent Labs    02/06/22 0500 02/07/22 0515  WBC 9.0 9.2  HGB 8.8* 8.8*  HCT 27.8* 28.4*  PLT 225 243   BMET Recent Labs    02/06/22 0500 02/07/22 0515  NA 138 138  K 3.9 3.7  CL 94* 93*  CO2 38* 36*  GLUCOSE 121* 135*  BUN 27* 26*  CREATININE 0.53 0.57  CALCIUM 9.4 9.2   PT/INR No results for input(s): "LABPROT", "INR" in the last 72 hours. CMP     Component Value Date/Time   NA 138 02/07/2022 0515   NA 139 11/18/2021 1432   NA 134 (L) 04/25/2017 1216   K 3.7 02/07/2022 0515   K 4.3 04/25/2017 1216   CL 93 (L) 02/07/2022 0515   CO2 36 (H) 02/07/2022 0515   CO2 29 04/25/2017 1216   GLUCOSE 135 (H) 02/07/2022 0515   GLUCOSE 85 04/25/2017 1216   BUN 26 (H) 02/07/2022 0515   BUN 10 11/18/2021 1432   BUN 14.4 04/25/2017 1216   CREATININE 0.57 02/07/2022 0515   CREATININE 0.72 01/17/2018 1347   CREATININE 0.7 04/25/2017 1216   CALCIUM 9.2 02/07/2022 0515   CALCIUM 10.0 04/25/2017 1216   PROT 5.9 (L) 02/03/2022  0549   PROT 7.2 04/25/2017 1216   ALBUMIN 2.0 (L) 02/03/2022 0549   ALBUMIN 3.7 04/25/2017 1216   AST 23 02/03/2022 0549   AST 20 01/17/2018 1347   AST 21 04/25/2017 1216   ALT 14 02/03/2022 0549   ALT 10 01/17/2018 1347   ALT 10 04/25/2017 1216   ALKPHOS 92 02/03/2022 0549   ALKPHOS 75 04/25/2017 1216   BILITOT 0.4 02/03/2022 0549   BILITOT 0.5 01/17/2018 1347   BILITOT 0.65 04/25/2017 1216   GFRNONAA >60 02/07/2022 0515   GFRNONAA >60 01/17/2018 1347   GFRAA 90 04/24/2020 1235   GFRAA >60 01/17/2018 1347   Lipase     Component Value Date/Time   LIPASE 77 07/28/2008 1630       Studies/Results: DG CHEST PORT 1 VIEW  Result Date: 02/06/2022 CLINICAL DATA:  Shortness of breath EXAM: PORTABLE CHEST 1 VIEW COMPARISON:  Previous studies including the examination of 02/03/2022 FINDINGS: There is increased density in right mid and right lower lung fields with interval worsening. There is blunting of left lateral CP angle. There are no signs of alveolar pulmonary edema in the visualized lung fields. Evaluation of the right mid and both lower lung fields for infiltrates is limited by effusions. Tip of  left PICC line is seen at the junction of superior vena cava and right atrium. There is no pneumothorax. IMPRESSION: Bilateral pleural effusions, more so on the right side. There is interval increase in amount of right pleural effusion. Possibility of atelectasis/pneumonia in right mid and both lower lung fields is not excluded. There are no new infiltrates in the visualized lung fields. Electronically Signed   By: Elmer Picker M.D.   On: 02/06/2022 14:56    Anti-infectives: Anti-infectives (From admission, onward)    Start     Dose/Rate Route Frequency Ordered Stop   01/26/22 1400  piperacillin-tazobactam (ZOSYN) IVPB 3.375 g        3.375 g 12.5 mL/hr over 240 Minutes Intravenous Every 8 hours 01/26/22 0945 02/02/22 0150   02/02/2022 1030  vancomycin (VANCOREADY) IVPB 750 mg/150 mL   Status:  Discontinued        750 mg 150 mL/hr over 60 Minutes Intravenous Every 24 hours 01/31/2022 0937 01/28/22 1520   01/30/2022 1000  vancomycin (VANCOREADY) IVPB 1500 mg/300 mL  Status:  Discontinued        1,500 mg 150 mL/hr over 120 Minutes Intravenous Every 48 hours 01/23/22 0909 01/09/2022 0937   01/23/22 1030  vancomycin (VANCOREADY) IVPB 1250 mg/250 mL        1,250 mg 166.7 mL/hr over 90 Minutes Intravenous  Once 01/23/22 0907 01/23/22 1259   01/23/22 1000  metroNIDAZOLE (FLAGYL) IVPB 500 mg  Status:  Discontinued        500 mg 100 mL/hr over 60 Minutes Intravenous Every 12 hours 01/23/22 0839 01/26/22 0945   01/23/22 1000  ceFEPIme (MAXIPIME) 2 g in sodium chloride 0.9 % 100 mL IVPB  Status:  Discontinued        2 g 200 mL/hr over 30 Minutes Intravenous Every 12 hours 01/23/22 0901 01/26/22 0945   01/07/2022 1400  micafungin (MYCAMINE) 100 mg in sodium chloride 0.9 % 100 mL IVPB  Status:  Discontinued        100 mg 105 mL/hr over 1 Hours Intravenous Every 24 hours 01/10/2022 1302 01/15/22 1211   01/10/22 1515  piperacillin-tazobactam (ZOSYN) IVPB 3.375 g        3.375 g 12.5 mL/hr over 240 Minutes Intravenous Every 8 hours 01/10/22 1427 01/23/2022 2359   01/04/22 2000  vancomycin (VANCOCIN) IVPB 1000 mg/200 mL premix  Status:  Discontinued        1,000 mg 200 mL/hr over 60 Minutes Intravenous Every 24 hours 01/03/22 1910 01/05/22 1321   01/03/22 2200  ceFEPIme (MAXIPIME) 2 g in sodium chloride 0.9 % 100 mL IVPB        2 g 200 mL/hr over 30 Minutes Intravenous Every 12 hours 01/03/22 1910 01/08/22 2209   01/03/22 1930  vancomycin (VANCOREADY) IVPB 1250 mg/250 mL        1,250 mg 166.7 mL/hr over 90 Minutes Intravenous  Once 01/03/22 1910 01/03/22 2219   01/02/22 2100  azithromycin (ZITHROMAX) 500 mg in sodium chloride 0.9 % 250 mL IVPB        500 mg 250 mL/hr  Intravenous Every 24 hours 12/10/2021 2248 01/06/22 2149   01/02/22 2000  cefTRIAXone (ROCEPHIN) 2 g in sodium chloride 0.9 % 100  mL IVPB  Status:  Discontinued        2 g 200 mL/hr over 30 Minutes Intravenous Every 24 hours 12/10/2021 2248 01/03/22 1733   12/28/2021 2030  cefTRIAXone (ROCEPHIN) 1 g in sodium chloride 0.9 % 100 mL IVPB  1 g 200 mL/hr over 30 Minutes Intravenous  Once 12/31/2021 2025 12/15/2021 2141   12/23/2021 2030  azithromycin (ZITHROMAX) 500 mg in sodium chloride 0.9 % 250 mL IVPB        500 mg 250 mL/hr over 60 Minutes Intravenous  Once 12/31/2021 2025 01/02/2022 2312        Assessment/Plan POD #26/24, S/p exploratory laparotomy,  R colectomy, abthera placement 01/30/2022 Dr. Rosendo Gros S/p exploratory laparotomy, intestinal anastomosis, abdominal closure 01/24/2022 Dr. 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 Redmond Pulling - Ostomy with some thin bilious output. Continue TPN for now.  -soft diet, breeze TID.  Continue TNA until better oral intake - WOC consult for new ileostomy and VAC - Vac MWF - zosyn x5 days postop (end date 8/28), WBC normal - pain improved with adjustment in pain meds.  Given she is AF VSS, will continue to hold on a scan at this time.  -likely remove VAC on Monday 9/4 due to shallowness of wound.   FEN: soft, breeze, TNA ID: Zosyn (stop date 8/14), cefepime/flagyl 8/19 >>8/21, Vanc/zosyn 8/21 --> 8/28 VTE: SCD's, hold DOAC/ heparin gtt    Below per TRH/CCM -- Septic shock due to above Acute hypoxic respiratory failure - per CCM, extubated on 8/26 Cardiac arrest 7/30, 7 mins CPR Hx cardiomyopathy COPD Afib, permanent CAP DNR   LOS: 50 days   Obie Dredge, [facility], [place]

## 2022-02-08 ENCOUNTER — Inpatient Hospital Stay (HOSPITAL_COMMUNITY): Payer: Medicare Other

## 2022-02-08 DIAGNOSIS — J189 Pneumonia, unspecified organism: Secondary | ICD-10-CM | POA: Diagnosis not present

## 2022-02-08 DIAGNOSIS — I469 Cardiac arrest, cause unspecified: Secondary | ICD-10-CM | POA: Diagnosis not present

## 2022-02-08 DIAGNOSIS — I4811 Longstanding persistent atrial fibrillation: Secondary | ICD-10-CM | POA: Diagnosis not present

## 2022-02-08 DIAGNOSIS — J9601 Acute respiratory failure with hypoxia: Secondary | ICD-10-CM | POA: Diagnosis not present

## 2022-02-08 LAB — COMPREHENSIVE METABOLIC PANEL
ALT: 12 U/L (ref 0–44)
AST: 25 U/L (ref 15–41)
Albumin: 1.9 g/dL — ABNORMAL LOW (ref 3.5–5.0)
Alkaline Phosphatase: 100 U/L (ref 38–126)
Anion gap: 8 (ref 5–15)
BUN: 32 mg/dL — ABNORMAL HIGH (ref 8–23)
CO2: 39 mmol/L — ABNORMAL HIGH (ref 22–32)
Calcium: 9.4 mg/dL (ref 8.9–10.3)
Chloride: 91 mmol/L — ABNORMAL LOW (ref 98–111)
Creatinine, Ser: 0.53 mg/dL (ref 0.44–1.00)
GFR, Estimated: >60 mL/min (ref >60)
Glucose, Bld: 117 mg/dL — ABNORMAL HIGH (ref 70–99)
Potassium: 4.5 mmol/L (ref 3.5–5.1)
Sodium: 138 mmol/L (ref 135–145)
Total Bilirubin: 0.6 mg/dL (ref 0.3–1.2)
Total Protein: 6.6 g/dL (ref 6.5–8.1)

## 2022-02-08 LAB — CBC WITH DIFFERENTIAL/PLATELET
Abs Immature Granulocytes: 0.21 10*3/uL — ABNORMAL HIGH (ref 0.00–0.07)
Abs Immature Granulocytes: 0.26 10*3/uL — ABNORMAL HIGH (ref 0.00–0.07)
Basophils Absolute: 0.1 10*3/uL (ref 0.0–0.1)
Basophils Absolute: 0.1 10*3/uL (ref 0.0–0.1)
Basophils Relative: 1 %
Basophils Relative: 1 %
Eosinophils Absolute: 0.2 10*3/uL (ref 0.0–0.5)
Eosinophils Absolute: 0.1 10*3/uL (ref 0.0–0.5)
Eosinophils Relative: 2 %
Eosinophils Relative: 1 %
HCT: 26.8 % — ABNORMAL LOW (ref 36.0–46.0)
HCT: 26.3 % — ABNORMAL LOW (ref 36.0–46.0)
Hemoglobin: 8.4 g/dL — ABNORMAL LOW (ref 12.0–15.0)
Hemoglobin: 8.2 g/dL — ABNORMAL LOW (ref 12.0–15.0)
Immature Granulocytes: 2 %
Immature Granulocytes: 2 %
Lymphocytes Relative: 16 %
Lymphocytes Relative: 16 %
Lymphs Abs: 1.6 10*3/uL (ref 0.7–4.0)
Lymphs Abs: 2 10*3/uL (ref 0.7–4.0)
MCH: 33.1 pg (ref 26.0–34.0)
MCH: 31.5 pg (ref 26.0–34.0)
MCHC: 31.3 g/dL (ref 30.0–36.0)
MCHC: 31.2 g/dL (ref 30.0–36.0)
MCV: 105.5 fL — ABNORMAL HIGH (ref 80.0–100.0)
MCV: 101.2 fL — ABNORMAL HIGH (ref 80.0–100.0)
Monocytes Absolute: 1.2 10*3/uL — ABNORMAL HIGH (ref 0.1–1.0)
Monocytes Absolute: 1.4 10*3/uL — ABNORMAL HIGH (ref 0.1–1.0)
Monocytes Relative: 12 %
Monocytes Relative: 11 %
Neutro Abs: 6.5 10*3/uL (ref 1.7–7.7)
Neutro Abs: 8.3 10*3/uL — ABNORMAL HIGH (ref 1.7–7.7)
Neutrophils Relative %: 67 %
Neutrophils Relative %: 69 %
Platelets: 251 10*3/uL (ref 150–400)
Platelets: 275 10*3/uL (ref 150–400)
RBC: 2.54 MIL/uL — ABNORMAL LOW (ref 3.87–5.11)
RBC: 2.6 MIL/uL — ABNORMAL LOW (ref 3.87–5.11)
RDW: 19.2 % — ABNORMAL HIGH (ref 11.5–15.5)
RDW: 19.1 % — ABNORMAL HIGH (ref 11.5–15.5)
WBC: 9.7 10*3/uL (ref 4.0–10.5)
WBC: 12.1 10*3/uL — ABNORMAL HIGH (ref 4.0–10.5)
nRBC: 0.5 % — ABNORMAL HIGH (ref 0.0–0.2)
nRBC: 0.3 % — ABNORMAL HIGH (ref 0.0–0.2)

## 2022-02-08 LAB — URINALYSIS, ROUTINE W REFLEX MICROSCOPIC
Bilirubin Urine: NEGATIVE
Glucose, UA: NEGATIVE mg/dL
Ketones, ur: NEGATIVE mg/dL
Nitrite: NEGATIVE
Protein, ur: NEGATIVE mg/dL
Specific Gravity, Urine: 1.009 (ref 1.005–1.030)
WBC, UA: >50 WBC/hpf — ABNORMAL HIGH (ref 0–5)
pH: 6 (ref 5.0–8.0)

## 2022-02-08 LAB — TRIGLYCERIDES
Triglycerides: 493 mg/dL — ABNORMAL HIGH (ref <150)
Triglycerides: 151 mg/dL — ABNORMAL HIGH (ref <150)

## 2022-02-08 LAB — GLUCOSE, CAPILLARY
Glucose-Capillary: 140 mg/dL — ABNORMAL HIGH (ref 70–99)
Glucose-Capillary: 122 mg/dL — ABNORMAL HIGH (ref 70–99)
Glucose-Capillary: 122 mg/dL — ABNORMAL HIGH (ref 70–99)

## 2022-02-08 LAB — PROCALCITONIN: Procalcitonin: 0.19 ng/mL

## 2022-02-08 LAB — LACTIC ACID, PLASMA: Lactic Acid, Venous: 0.9 mmol/L (ref 0.5–1.9)

## 2022-02-08 LAB — MAGNESIUM: Magnesium: 2 mg/dL (ref 1.7–2.4)

## 2022-02-08 LAB — PHOSPHORUS: Phosphorus: 4.3 mg/dL (ref 2.5–4.6)

## 2022-02-08 MED ORDER — ACETAMINOPHEN 650 MG RE SUPP
650.0000 mg | Freq: Four times a day (QID) | RECTAL | Status: DC | PRN
  Administered 2022-02-08 – 2022-02-09 (×2): 650 mg via RECTAL
  Filled 2022-02-08 (×2): qty 1

## 2022-02-08 MED ORDER — VANCOMYCIN HCL 1500 MG/300ML IV SOLN
1500.0000 mg | Freq: Once | INTRAVENOUS | Status: AC
  Administered 2022-02-08: 1500 mg via INTRAVENOUS
  Filled 2022-02-08: qty 300

## 2022-02-08 MED ORDER — TRACE MINERALS CU-MN-SE-ZN 300-55-60-3000 MCG/ML IV SOLN
INTRAVENOUS | Status: AC
  Filled 2022-02-08: qty 696.8

## 2022-02-08 MED ORDER — AYR SALINE NASAL NA GEL
1.0000 | NASAL | Status: DC
Start: 2022-02-08 — End: 2022-02-11
  Administered 2022-02-08 – 2022-02-11 (×13): 1 via NASAL
  Filled 2022-02-08 (×3): qty 14.1

## 2022-02-08 MED ORDER — VANCOMYCIN HCL 750 MG/150ML IV SOLN
750.0000 mg | INTRAVENOUS | Status: DC
  Administered 2022-02-09 – 2022-02-10 (×2): 750 mg via INTRAVENOUS
  Filled 2022-02-08 (×2): qty 150

## 2022-02-08 MED ORDER — PIPERACILLIN-TAZOBACTAM 3.375 G IVPB
3.3750 g | Freq: Three times a day (TID) | INTRAVENOUS | Status: DC
  Administered 2022-02-08 – 2022-02-11 (×7): 3.375 g via INTRAVENOUS
  Filled 2022-02-08 (×9): qty 50

## 2022-02-08 NOTE — Progress Notes (Signed)
Occupational Therapy Treatment Patient Details Name: [name] MRN: [id number] DOB: [birth year] Today's Date: 02/08/2022   History of present illness 81 y.o. female admitted 12/25/2021 with CAP.  7/30 AMS, head CT negative for acute abnormality. Code blue 7/30, ROSC achieved after 7 min CPR, pt intubated. Chest tube insertion 7/31; pt removed CT 8/1. ETT 7/30-8/2. Continued AMS, hallucinations overnight 8/2; head CT 8/2 remains negative for acute abnormality. EEG 8/2 with no seizure activity. Improved cognition 8/5, but persistent tachycardia/afib with RVR. 8/6 ischemic colitis. 8/7 shock with intubation and to OR for ex lap. 8/9 abdomen closure. 8/10 extubation. 8/21 respiratory distress with reintubation. 8/23 return to OR for ex lap and small bowel resection with ileostomy. Extubated 8/26 and thoracentesis. 8/28 respiratory distress with urgent thoracentesis. PMHx:Afib, HTN, breast CA, melanoma, asthma, bilateral TKA, anxiety.   OT comments  Patient received in supine and agreeable to OT/PT session. Patient required assistance with BLE to initiate getting to EOB and was able to complete with increased time and assistance scooting forward. Patient was min assist +2 to stand from EOB and took steps towards sink before requiring seated break. Patient stood at sink for grooming but was reliable on sink for support with balance. Patient assisted back to bed due to transport arrived to take her to CT. Patient continues to be a good candidate for AIR.    Recommendations for follow up therapy are one component of a multi-disciplinary discharge planning process, led by the attending physician.  Recommendations may be updated based on patient status, additional functional criteria and insurance authorization.    Follow Up Recommendations  Acute inpatient rehab (3hours/day)    Assistance Recommended at Discharge Frequent or constant Supervision/Assistance  Patient can return home with the following   Assistance with cooking/housework;Assistance with feeding;Direct supervision/assist for medications management;Direct supervision/assist for financial management;Assist for transportation;Help with stairs or ramp for entrance;Two people to help with walking and/or transfers;A lot of help with bathing/dressing/bathroom   Equipment Recommendations  BSC/3in1;Tub/shower seat    Recommendations for Other Services      Precautions / Restrictions Precautions Precautions: Fall;Other (comment) Precaution Comments: ileostomy, PICC line, wound vac Restrictions Weight Bearing Restrictions: No       Mobility Bed Mobility Overal bed mobility: Needs Assistance Bed Mobility: Supine to Sit, Sit to Supine     Supine to sit: HOB elevated, Mod assist Sit to supine: Mod assist   General bed mobility comments: increased time and asssitance with BLEs    Transfers Overall transfer level: Needs assistance Equipment used: Rolling walker (2 wheels) Transfers: Sit to/from Stand, Bed to chair/wheelchair/BSC Sit to Stand: Mod assist, Min guard, +2 physical assistance     Step pivot transfers: Min assist     General transfer comment: assistance of 2 for lines and safety     Balance Overall balance assessment: Needs assistance Sitting-balance support: No upper extremity supported, Feet supported Sitting balance-Leahy Scale: Fair Sitting balance - Comments: minguard sitting EOB   Standing balance support: Bilateral upper extremity supported Standing balance-Leahy Scale: Poor Standing balance comment: reliant on RW or sink for support                           ADL either performed or assessed with clinical judgement   ADL Overall ADL's : Needs assistance/impaired     Grooming: Brushing hair;Standing;Maximal assistance Grooming Details (indicate cue type and reason): unable to assist while standind due to reliant on UE support when standing  General ADL Comments: stood at sink for brushing hair but did not feel comfortable not holding onto sink with BUEs    Extremity/Trunk Assessment              Vision       Perception     Praxis      Cognition Arousal/Alertness: Awake/alert Behavior During Therapy: Flat affect Overall Cognitive Status: Impaired/Different from baseline Area of Impairment: Following commands, Safety/judgement, Awareness, Problem solving                 Orientation Level: Disoriented to, Time Current Attention Level: Selective   Following Commands: Follows one step commands consistently, Follows multi-step commands with increased time Safety/Judgement: Decreased awareness of safety, Decreased awareness of deficits Awareness: Emergent Problem Solving: Slow processing General Comments: slow processing with increased time to follow directions        Exercises      Shoulder Instructions       General Comments      Pertinent Vitals/ Pain       Pain Assessment Pain Assessment: Faces Faces Pain Scale: Hurts even more Pain Location: abdomen Pain Descriptors / Indicators: Grimacing Pain Intervention(s): Limited activity within patient's tolerance, Monitored during session, Repositioned  Home Living                                          Prior Functioning/Environment              Frequency  Min 2X/week        Progress Toward Goals  OT Goals(current goals can now be found in the care plan section)  Progress towards OT goals: Progressing toward goals  Acute Rehab OT Goals Patient Stated Goal: get better Time For Goal Achievement: 02/12/22 Potential to Achieve Goals: Good ADL Goals Pt Will Perform Grooming: with supervision;sitting Pt Will Perform Upper Body Dressing: with min guard assist;sitting Pt Will Perform Lower Body Dressing: with min assist;sitting/lateral leans Pt Will Transfer to Toilet: with min assist;stand pivot transfer;bedside  commode Pt Will Perform Toileting - Clothing Manipulation and hygiene: with min guard assist;sitting/lateral leans Additional ADL Goal #1: Pt will follow multistep directions with 100% accuracy  Plan Discharge plan remains appropriate    Co-evaluation    PT/OT/SLP Co-Evaluation/Treatment: Yes Reason for Co-Treatment: For patient/therapist safety;To address functional/ADL transfers   OT goals addressed during session: ADL's and self-care      AM-PAC OT "6 Clicks" Daily Activity     Outcome Measure   Help from another person eating meals?: A Lot Help from another person taking care of personal grooming?: A Little Help from another person toileting, which includes using toliet, bedpan, or urinal?: A Lot Help from another person bathing (including washing, rinsing, drying)?: A Lot Help from another person to put on and taking off regular upper body clothing?: A Lot Help from another person to put on and taking off regular lower body clothing?: A Lot 6 Click Score: 13    End of Session Equipment Utilized During Treatment: Rolling walker (2 wheels);Oxygen  OT Visit Diagnosis: Other abnormalities of gait and mobility (R26.89);Other symptoms and signs involving cognitive function;Muscle weakness (generalized) (M62.81)   Activity Tolerance Patient tolerated treatment well   Patient Left in bed;with call bell/phone within reach;with bed alarm set   Nurse Communication Mobility status        Time: 7408-1448 OT Time Calculation (min): 47  min  Charges: OT General Charges $OT Visit: 1 Visit OT Treatments $Self Care/Home Management : 8-22 mins $Therapeutic Activity: 8-22 mins  Lodema Hong, OTA Acute Rehabilitation Services  Office [place] 02/08/2022, 1:19 PM

## 2022-02-08 NOTE — Progress Notes (Signed)
Repeat rectal temperature 102.4. Paged on-call hospitalist for appropriate intervention.

## 2022-02-08 NOTE — Sepsis Progress Note (Signed)
Elink following for Sepsis Protocol 

## 2022-02-08 NOTE — Progress Notes (Signed)
Pharmacy Antibiotic Note  [name] is a 81 y.o. female admitted on 01/04/2022 with sepsis. Admitted on 12/31/2021, s/p PEA arrest 2/2 to respiratory failure, s/p antibiotics for CAP and septic shock 2/2 ischemic colitis with perforation, s/p repair. Patient has had complex abx courses (see below). Pharmacy has been re-consulted for vancomycin and zosyn dosing for new sepsis.   Plan: Begin vancomycin 1515m IV x1 then vancomycin 7559mIV q24h (eAUC 434, based on previous regimen and using Vd 0.5)  Begin zosyn 3.375gm IV q8h  Monitor renal function, clinical s/sx infection, culture results  Obtain vancomycin levels as indicated  Height: 5' (152.4 cm) Weight: 73 kg (160 lb 15 oz) IBW/kg (Calculated) : 45.5  Temp (24hrs), Avg:99.7 F (37.6 C), Min:98.2 F (36.8 C), Max:102.7 F (39.3 C)  Recent Labs  Lab 02/04/22 0611 02/05/22 0650 02/05/22 0940 02/06/22 0500 02/07/22 0515 02/08/22 0309 02/08/22 0524  WBC 9.0  --  9.0 9.0 9.2 9.7  --   CREATININE 0.45 0.49  --  0.53 0.57  --  0.53     Estimated Creatinine Clearance: 51.7 mL/min (by C-G formula based on SCr of 0.53 mg/dL).    Allergies  Allergen Reactions   Chlorhexidine Gluconate Hives, Swelling and Rash       "ChloraPrep "   Doxycycline Nausea And Vomiting   Codeine Nausea Only and Other (See Comments)    Pt can tolerate when pre-medicated   Doxycycline Nausea And Vomiting   Nsaids     avoid due to gerd   Nsaids Other (See Comments)    Gi bleed   Tylenol [Acetaminophen]     Avoid due to GERD   Adhesive [Tape] Rash    Dermabond   Chlorhexidine Swelling and Rash   Codeine Nausea Only   Tylenol [Acetaminophen] Other (See Comments)    GI bleed    Antimicrobials this admission: Azithro 7/28 >> 8/2 Ceftriaxone 7/28>> 7/29 Vanc 7/30>> 8/1; 8/19>>8/24; 9/4>>  Cefepime 7/31>> 8/3; 8/19>>8/22 Flagyl 8/19 >> 8/22 Zosyn 8/6> 8/14: 8/22 >>8/28; 9/4>> Micafungin 8/7>8/11  Microbiology results: 8/18 Bcx -  NG Pleural fluid 7/31: ng Bcx 7/28: ng BAL 7/30: ng Ucx 7/29: ng  MRSA PCR 7/28: neg  8/21 Bcx NG Abd Cx 8/22: E faecalis and K. Oxytoca  Thank you for allowing pharmacy to be a part of this patient's care.  MaWilson SingerPharmD Clinical Pharmacist 02/08/2022 9:35 PM

## 2022-02-08 NOTE — Progress Notes (Signed)
PROGRESS NOTE        PATIENT DETAILS Name: [name] Age: 81 y.o. Sex: female Date of Birth: December 21, 1940 Admit Date: 01/04/2022 Admitting Physician Kayleen Memos, DO TKW:[id number], [place] [facility] [place]  Brief Summary: Patient is a 81 y.o.  female with history of persistent atrial fibrillation, HTN, chronic HFpEF, breast cancer on letrozole, pulmonary hypertension, asthma-who presented to the hospital on 7/28 with cough/shortness of breath-she was found to have acute hypoxic respiratory failure due to right lobar PNA.  She was started on IV antibiotics and admitted to the hospitalist service.  On 7/30-she sustained a in-hospital cardiac arrest thought to be due to underlying respiratory decline from pneumonia, she was intubated and transferred to the ICU.  CT chest showed a large right-sided pleural effusion-she underwent chest tube placement.  She was stabilized and transferred back to [facility].  Unfortunately-further hospital course was complicated by development of acute abdomen in the setting of ischemic colon with perforation requiring multiple laparotomies and bowel resection.  She remained in the ICU for while she underwent numerous surgical procedures-once stabilized-she was extubated and transferred back to [facility] on 8/31.  See below for further details.  Significant events: 7/28>> admit to [facility]-sepsis with hypoxia due to PNA.  7/30>> PEA arrest.  Transfer to ICU.  CT chest with worsening pleural effusion. 7/31>> Chest tube placed 8/01>> patient removed chest tube.   8/06>> Transfer to [facility] 8/07>> back to ICU-acute abdomen-hypertension-A-fib RVR.  Found to have ischemic colon with contained perforation-underwent exploratory laparotomy and colon resection. 8/09>> exploratory laparotomy/intestinal anastomosis and abdominal closure. 8/10>> Extubated 8/13 Transfer to [facility] 8/20>> CT A/P with fuid collection in R mid-abdomen with c/f anastomotic leak. 8/21>>  Back to ICU for resp distress 8/22>> CT-guided drain placement (RLQ) for fluid assessment, abscess vs. Anastomotic leak 8/23>> Taken back to OR for ex-lap/concern for anastomotic leak; extensive LOA with findings of feculent peritonitis with +leak; enterotomies requiring additional SBR and end ileostomy. 8/25>> Made DNR after palliative meeting 8/26>> Extubated. Right thoracentesis with removal of 600 cc 8/28>> respiratory distress, urgent thoracentesis removal of 600 cc, diuresis with Lasix, came off BiPAP 8/31>> transferred to [facility]. 9/02>> slightly worsening shortness of breath at rest-CXR with persistent right pleural effusion-discussed with PCCM-no need for thoracocentesis.  DNR reconfirmed-patient does not want BiPAP as well.  Significant studies: 7/29>> CT angio chest: No PE, large areas of consolidation with air bronchogram involving right lower/right middle lobe. 7/30>> CT head: No acute intracranial abnormality. 7/30>> CT chest/abdomen/pelvis: Worsening right pleural effusion, panchamber cardiomegaly 7/31>> Echo: EF 25-30%, changes consistent with chronic Takotsubo cardiomyopathy.  RV Eduardo Osier function is mildly reduced. 8/02>> CT head: No acute intracranial process 8/02>> Spot EEG: No seizures. 8/06>> CT abdomen pelvis: Interval development of pneumatosis involving cecum/ascending colon-concerning for ischemic colitis.  Associated small bowel obstruction. 8/14>> Limited echo: EF 60-65%.  RV systolic function moderately reduced. 8/17>> left upper extremity Doppler: No DVT. 8/20>> CT abdomen/pelvis: Surgical changes of right hemicolectomy-extraluminal collection of gas/fluid in the right mid abdomen.  Extensive infiltrative changes within the mesentery of the right abdomen.     Significant microbiology data: 7/28>> blood culture: No growth 7/29>> urine culture: No growth 7/30>> BAL culture: No growth 7/31>> pleural fluid culture: No growth 8/18>> blood culture: No growth 8/19>>  urine culture: Enterococcus faecalis 8/21>> tracheal aspirate: No growth 8/21>> blood culture: No growth  8/22>> abscess abdomen: Enterococcus/Klebsiella 8/26>> pleural fluid culture: No growth  Consults: PCCM, cardiology, palliative care, general surgery, interventional radiology, neurology  Subjective: On 4-5 L this morning-feels somewhat dyspneic but comfortable.  Objective: Vitals: Blood pressure 100/60, pulse 90, temperature 98.8 F (37.1 C), temperature source Oral, resp. rate 20, height 5' (1.524 m), weight 73 kg, SpO2 100 %.   Exam: Gen Exam:Alert awake-not in any distress HEENT:atraumatic, normocephalic Chest: B/L clear to auscultation anteriorly CVS:S1S2 regular Abdomen:soft non tender, non distended Extremities:no edema Neurology: Non focal Skin: no rash   Pertinent Labs/Radiology:    Latest Ref Rng & Units 02/08/2022    3:09 AM 02/07/2022    5:15 AM 02/06/2022    5:00 AM  CBC  WBC 4.0 - 10.5 K/uL 9.7  9.2  9.0   Hemoglobin 12.0 - 15.0 g/dL 8.4  8.8  8.8   Hematocrit 36.0 - 46.0 % 26.8  28.4  27.8   Platelets 150 - 400 K/uL 251  243  225     Lab Results  Component Value Date   NA 138 02/08/2022   K 4.5 02/08/2022   CL 91 (L) 02/08/2022   CO2 39 (H) 02/08/2022      Assessment/Plan: Acute hypoxic respiratory failure Right lobar PNA Parapneumonic effusion-s/p chest tube placement-removed 8/1 Continues to have episodes of labored breathing-unclear if this is from anxiety-volume status is improved on IV Lasix.  Check CT chest today to see if there is anything else apart from pleural effusion that could be contributing to her symptoms.  Continue supportive care in the interim.  Ischemic bowel with perforation-s/p right colectomy and exploratory laparotomy on 8/7. 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  Remains on TNA.  General surgery following and directing care.    Septic shock with bowel perforation and intra-abdominal abscess:  Sepsis physiology has resolved-completed a course of IV antibiotics.  S/p CT-guided drain placement by IR on 8/22.  PEA arrest Felt to be due to respiratory issues-continue telemetry monitoring.  Initial echo showed changes consistent with Takotsubo cardiomyopathy-thankfully a limited echo few days later showed normal EF.  Takotsubo cardiomyopathy with recovered EF Acute on chronic HFpEF Volume overload due to third spacing/hypoalbuminemia Volume status has improved-on IV Lasix-continue beta-blocker-continue to follow electrolytes/intake/output and weights.    Persistent atrial fibrillation:  On beta-blocker-anticoagulated with Lovenox-once not thought to require any surgical procedures-we will switch to oral agent.  History of moderate pulmonary hypertension Will need close outpatient follow-up with pulmonology and cardiology.  HTN BP stable on metoprolol.  History of bronchial asthma: Not in exacerbation-continue bronchodilators.  Acute metabolic encephalopathy Due to septic shock/peritonitis/hypoxemia.  Resolved-awake and alert this morning.  EEG negative for seizures.  Normocytic anemia Due to critical illness-transfuse if significant drop.  No evidence of blood loss.  History of breast cancer-s/p right mastectomy on 01/24/2017-completed adjuvant radiation November 2018 resume letrozole when able.  Severe debility/deconditioning PT/OT Probably SNF on discharge.  Palliative care: DNR in place-if she develops worsening respiratory failure-she does not desire BiPAP-understands that apart from transitioning to comfort measures-no further options available.  Nutrition Status: Nutrition Problem: Severe Malnutrition Etiology: acute  illness Signs/Symptoms: mild fat depletion, mild muscle depletion, moderate muscle depletion, severe fat depletion Interventions: TPN, Boost Breeze, Refer to RD note for recommendations   Pressure Ulcer: Pressure Injury  01/28/2022 Nose Left;Right Stage 2 -  Partial thickness loss of dermis presenting as a shallow open injury with a red, pink wound bed without slough. raw and pink area with scabs very painful (Active)  02/01/2022 1147  Location: Nose  Location Orientation: Left;Right  Staging: Stage 2 -  Partial thickness loss of dermis presenting as a shallow open injury with a red, pink wound bed without slough.  Wound Description (Comments): raw and pink area with scabs very painful  Present on Admission: No  Dressing Type Other (Comment) 02/07/22 2030   Obesity: Estimated body mass index is 31.43 kg/m as calculated from the following:   Height as of this encounter: 5' (1.524 m).   Weight as of this encounter: 73 kg.   Code status:   Code Status: DNR   DVT Prophylaxis: Place and maintain sequential compression device Start: 01/20/2022 1701 SCDs Start: 01/17/2022 3710 therapeutic Lovenox  Family Communication: Son-[phone]-updated at bedside on 9/2.   Disposition Plan: Status is: Inpatient Remains inpatient appropriate because: Severity of illness-diet being advanced-on IV diuretics for volume overload.   Planned Discharge Destination: CIR evaluation in progress.   Diet: Diet Order             DIET SOFT Room service appropriate? Yes; Fluid consistency: Thin  Diet effective now                     Antimicrobial agents: Anti-infectives (From admission, onward)    Start     Dose/Rate Route Frequency Ordered Stop   01/26/22 1400  piperacillin-tazobactam (ZOSYN) IVPB 3.375 g        3.375 g 12.5 mL/hr over 240 Minutes Intravenous Every 8 hours 01/26/22 0945 02/02/22 0150   01/07/2022 1030  vancomycin (VANCOREADY) IVPB 750 mg/150 mL  Status:  Discontinued         750 mg 150 mL/hr over 60 Minutes Intravenous Every 24 hours 01/06/2022 0937 01/28/22 1520   01/29/2022 1000  vancomycin (VANCOREADY) IVPB 1500 mg/300 mL  Status:  Discontinued        1,500 mg 150 mL/hr over 120 Minutes Intravenous Every 48 hours 01/23/22 0909 02/04/2022 0937   01/23/22 1030  vancomycin (VANCOREADY) IVPB 1250 mg/250 mL        1,250 mg 166.7 mL/hr over 90 Minutes Intravenous  Once 01/23/22 0907 01/23/22 1259   01/23/22 1000  metroNIDAZOLE (FLAGYL) IVPB 500 mg  Status:  Discontinued        500 mg 100 mL/hr over 60 Minutes Intravenous Every 12 hours 01/23/22 0839 01/26/22 0945   01/23/22 1000  ceFEPIme (MAXIPIME) 2 g in sodium chloride 0.9 % 100 mL IVPB  Status:  Discontinued        2 g 200 mL/hr over 30 Minutes Intravenous Every 12 hours 01/23/22 0901 01/26/22 0945   01/28/2022 1400  micafungin (MYCAMINE) 100 mg in sodium chloride 0.9 % 100 mL IVPB  Status:  Discontinued        100 mg 105 mL/hr over 1 Hours Intravenous Every 24 hours 01/20/2022 1302 01/15/22 1211   01/10/22 1515  piperacillin-tazobactam (ZOSYN) IVPB 3.375 g        3.375 g 12.5 mL/hr over 240 Minutes Intravenous Every 8 hours 01/10/22 1427 01/09/2022 2359   01/04/22 2000  vancomycin (VANCOCIN) IVPB 1000 mg/200 mL premix  Status:  Discontinued        1,000 mg 200 mL/hr over 60 Minutes Intravenous Every 24 hours 01/03/22 1910 01/05/22 1321   01/03/22 2200  ceFEPIme (  MAXIPIME) 2 g in sodium chloride 0.9 % 100 mL IVPB        2 g 200 mL/hr over 30 Minutes Intravenous Every 12 hours 01/03/22 1910 01/08/22 2209   01/03/22 1930  vancomycin (VANCOREADY) IVPB 1250 mg/250 mL        1,250 mg 166.7 mL/hr over 90 Minutes Intravenous  Once 01/03/22 1910 01/03/22 2219   01/02/22 2100  azithromycin (ZITHROMAX) 500 mg in sodium chloride 0.9 % 250 mL IVPB        500 mg 250 mL/hr  Intravenous Every 24 hours 12/27/2021 2248 01/06/22 2149   01/02/22 2000  cefTRIAXone (ROCEPHIN) 2 g in sodium chloride 0.9 % 100 mL IVPB  Status:  Discontinued         2 g 200 mL/hr over 30 Minutes Intravenous Every 24 hours 12/08/2021 2248 01/03/22 1733   12/28/2021 2030  cefTRIAXone (ROCEPHIN) 1 g in sodium chloride 0.9 % 100 mL IVPB        1 g 200 mL/hr over 30 Minutes Intravenous  Once 12/16/2021 2025 12/30/2021 2141   12/14/2021 2030  azithromycin (ZITHROMAX) 500 mg in sodium chloride 0.9 % 250 mL IVPB        500 mg 250 mL/hr over 60 Minutes Intravenous  Once 12/14/2021 2025 12/21/2021 2312        MEDICATIONS: Scheduled Meds:  acetaminophen  1,000 mg Oral Q8H   arformoterol  15 mcg Nebulization BID   budesonide (PULMICORT) nebulizer solution  0.25 mg Nebulization BID   enoxaparin (LOVENOX) injection  100 mg Subcutaneous Q24H   feeding supplement  1 Container Oral TID BM   furosemide  60 mg Intravenous Q12H   lidocaine  10 mL Other Once   metoprolol succinate  50 mg Oral Daily   mirtazapine  7.5 mg Oral QHS   mupirocin ointment   Nasal BID   pantoprazole (PROTONIX) IV  40 mg Intravenous Q24H   revefenacin  175 mcg Nebulization Daily   saline  1 Application Each Nare J6O   Continuous Infusions:  sodium chloride 10 mL/hr at 02/07/22 1815   sodium chloride 250 mL (02/07/22 0959)   TPN ADULT (ION) 65 mL/hr at 02/07/22 1815   TPN ADULT (ION)     PRN Meds:.guaiFENesin, haloperidol lactate, levalbuterol, lip balm, methocarbamol, morphine CONCENTRATE, ondansetron (ZOFRAN) IV, oxyCODONE, sodium chloride, sodium chloride, sodium chloride flush, white petrolatum   I have personally reviewed following labs and imaging studies  LABORATORY DATA: CBC: Recent Labs  Lab 02/04/22 0611 02/05/22 0940 02/06/22 0500 02/07/22 0515 02/08/22 0309  WBC 9.0 9.0 9.0 9.2 9.7  NEUTROABS  --  6.0 5.8 5.9 6.5  HGB 9.0* 8.9* 8.8* 8.8* 8.4*  HCT 29.6* 28.5* 27.8* 28.4* 26.8*  MCV 102.4* 100.0 98.2 100.4* 105.5*  PLT 210 223 225 243 251     Basic Metabolic Panel: Recent Labs  Lab 02/04/22 0611 02/05/22 0650 02/06/22 0500 02/07/22 0515 02/08/22 0524  NA  140 136 138 138 138  K 4.4 4.1 3.9 3.7 4.5  CL 96* 91* 94* 93* 91*  CO2 39* 39* 38* 36* 39*  GLUCOSE 117* 124* 121* 135* 117*  BUN 26* 29* 27* 26* 32*  CREATININE 0.45 0.49 0.53 0.57 0.53  CALCIUM 9.2 9.3 9.4 9.2 9.4  MG 2.1 1.9 1.7 1.7 2.0  PHOS 3.6  --   --   --  4.3     GFR: Estimated Creatinine Clearance: 51.7 mL/min (by C-G formula based on SCr of 0.53 mg/dL).  Liver  Function Tests: Recent Labs  Lab 02/03/22 0549 02/08/22 0524  AST 23 25  ALT 14 12  ALKPHOS 92 100  BILITOT 0.4 0.6  PROT 5.9* 6.6  ALBUMIN 2.0* 1.9*    No results for input(s): "LIPASE", "AMYLASE" in the last 168 hours. No results for input(s): "AMMONIA" in the last 168 hours.  Coagulation Profile: No results for input(s): "INR", "PROTIME" in the last 168 hours.  Cardiac Enzymes: No results for input(s): "CKTOTAL", "CKMB", "CKMBINDEX", "TROPONINI" in the last 168 hours.  BNP (last 3 results) No results for input(s): "PROBNP" in the last 8760 hours.  Lipid Profile: Recent Labs    02/08/22 0309 02/08/22 0552  TRIG 493* 151*     Thyroid Function Tests: No results for input(s): "TSH", "T4TOTAL", "FREET4", "T3FREE", "THYROIDAB" in the last 72 hours.  Anemia Panel: No results for input(s): "VITAMINB12", "FOLATE", "FERRITIN", "TIBC", "IRON", "RETICCTPCT" in the last 72 hours.  Urine analysis:    Component Value Date/Time   COLORURINE YELLOW 01/23/2022 1135   APPEARANCEUR CLEAR 01/23/2022 1135   LABSPEC <1.005 (L) 01/23/2022 1135   PHURINE 7.0 01/23/2022 1135   GLUCOSEU NEGATIVE 01/23/2022 1135   HGBUR TRACE (A) 01/23/2022 1135   BILIRUBINUR NEGATIVE 01/23/2022 1135   KETONESUR NEGATIVE 01/23/2022 1135   PROTEINUR NEGATIVE 01/23/2022 1135   UROBILINOGEN 0.2 01/22/2013 1345   NITRITE NEGATIVE 01/23/2022 1135   LEUKOCYTESUR LARGE (A) 01/23/2022 1135    Sepsis Labs: Lactic Acid, Venous    Component Value Date/Time   LATICACIDVEN 1.0 01/26/2022 0440    MICROBIOLOGY: Recent  Results (from the past 240 hour(s))  Culture, body fluid w Gram Stain-bottle     Status: None   Collection Time: 01/30/22 10:04 AM   Specimen: Pleura  Result Value Ref Range Status   Specimen Description PLEURAL  Final   Special Requests NONE  Final   Culture   Final    NO GROWTH 5 DAYS Performed at [facility], [place] [address] [place], [place] [address]    Report Status 02/04/2022 FINAL  Final  Gram stain     Status: None   Collection Time: 01/30/22 10:04 AM   Specimen: Pleura  Result Value Ref Range Status   Specimen Description PLEURAL  Final   Special Requests NONE  Final   Gram Stain   Final    RARE WBC PRESENT, PREDOMINANTLY MONONUCLEAR NO ORGANISMS SEEN Performed at [facility], [place] [address] [place], [place] [address]    Report Status 01/30/2022 FINAL  Final    RADIOLOGY STUDIES/RESULTS: DG Chest Port 1V same Day  Result Date: 02/08/2022 CLINICAL DATA:  Shortness of breath. EXAM: PORTABLE CHEST 1 VIEW COMPARISON:  02/06/2022 FINDINGS: Left arm PICC line remains in appropriate position. Heart size is stable. Infiltrate or atelectasis in the right lung base shows no significant change. Layering right pleural effusion is also stable. Atelectasis or consolidation in the medial left lung base is also unchanged. IMPRESSION: No significant change in right greater than left basilar atelectasis versus infiltrates, and layering right pleural effusion. Electronically Signed   By: Marlaine Hind M.D.   On: 02/08/2022 09:43   DG CHEST PORT 1 VIEW  Result Date: 02/06/2022 CLINICAL DATA:  Shortness of breath EXAM: PORTABLE CHEST 1 VIEW COMPARISON:  Previous studies including the examination of 02/03/2022 FINDINGS: There is increased density in right mid and right lower lung fields with interval worsening. There is blunting of left lateral CP angle. There are no signs of alveolar pulmonary  edema in the visualized lung fields. Evaluation of the right mid and both lower lung fields  for infiltrates is limited by effusions. Tip of left PICC line is seen at the junction of superior vena cava and right atrium. There is no pneumothorax. IMPRESSION: Bilateral pleural effusions, more so on the right side. There is interval increase in amount of right pleural effusion. Possibility of atelectasis/pneumonia in right mid and both lower lung fields is not excluded. There are no new infiltrates in the visualized lung fields. Electronically Signed   By: Elmer Picker M.D.   On: 02/06/2022 14:56     LOS: 38 days   Oren Binet, MD  [facility]    To contact the attending provider between 7A-7P or the covering provider during after hours 7P-7A, please log into the web site [email or web address] and access using universal [place] password for that web site. If you do not have the password, please call the hospital operator.  02/08/2022, 12:02 PM

## 2022-02-08 NOTE — Significant Event (Signed)
Rapid Response Event Note   Reason for Call :  Shaking  Initial Focused Assessment:  Pt lying in bed with eyes open. She is confused(this is not new per bedside RN). She is shaking. Pt c/o being cold. Skin is hot to touch.   T-102.7(R), HR-108, BP-107/84. RR-25, SpO2-100% on 6L [place].   Interventions:  Blankets removed  from pt Tylenol for fever Plan of Care:  Pt having rigors. Treat fever and continue to monitor pt closely. Call RRT if further assistance needed.   Event Summary:   MD Notified: RN to notify Dr. Marlowe Sax Call [place] [phone] End Time:2005  Dillard Essex, RN

## 2022-02-08 NOTE — Progress Notes (Signed)
Overnight progress note  Chart reviewed, very complicated hospital course and no longer on antibiotics.  This evening patient febrile with rectal temperature 102.7 F and having chills.  Tachycardic and tachypneic.  Satting 94% on 6 L O2, no change in oxygen requirement since daytime.  CT chest without contrast done this afternoon: "IMPRESSION: There is interval decrease in amount of right pleural effusion with moderate residual right pleural effusion. There is interval improvement in aeration in right middle lobe and right lower lobe suggesting decrease in atelectasis. Still, fairly extensive infiltrates are seen in right upper lobe, right middle lobe and right lower lobe suggesting atelectasis/pneumonia. Follow-up studies until complete clearing occurs should be considered to rule out any underlying neoplastic process. There are small patchy infiltrates in the lingula and left lower lobe suggesting subsegmental atelectasis/pneumonia. Minimal left pleural effusion is seen.   Cardiomegaly. Coronary artery calcifications are seen. There is ectasia of main pulmonary artery suggesting pulmonary arterial hypertension.   There is subcutaneous stranding in the anterior chest wall close to the left axilla suggesting contusion or cellulitis."  -Start broad-spectrum antibiotics including vancomycin and Zosyn -Stat labs ordered including CBC with differential, CMP, PT/INR, lactate, procalcitonin, blood cultures, UA -Stat CT abdomen pelvis -She is on IV Lasix due to volume overload, avoiding giving fluid boluses as she is not hypotensive. -Continue to monitor very closely

## 2022-02-08 NOTE — Progress Notes (Signed)
   02/08/22 1941  Assess: MEWS Score  Temp (!) 102.7 F (39.3 C)  BP 107/84  MAP (mmHg) 89  ECG Heart Rate (!) 108  Resp (!) 25  Level of Consciousness Alert  SpO2 100 %  O2 Device Nasal Cannula  O2 Flow Rate (L/min) 6 L/min  Assess: MEWS Score  MEWS Temp 2  MEWS Systolic 0  MEWS Pulse 1  MEWS RR 1  MEWS LOC 0  MEWS Score 4  MEWS Score Color Red  Assess: if the MEWS score is Yellow or Red  Were vital signs taken at a resting state? Yes  Focused Assessment No change from prior assessment  Does the patient meet 2 or more of the SIRS criteria? No  Does the patient have a confirmed or suspected source of infection? No  Provider and Rapid Response Notified? Yes  MEWS guidelines implemented *See Row Information* Yes  Treat  MEWS Interventions Other (Comment) (awaiting orders for tylenol)  Pain Scale 0-10  Pain Score 5  Take Vital Signs  Increase Vital Sign Frequency  Red: Q 1hr X 4 then Q 4hr X 4, if remains red, continue Q 4hrs  Escalate  MEWS: Escalate Red: discuss with charge nurse/RN and provider, consider discussing with RRT  Notify: Charge Nurse/RN  Name of Charge Nurse/RN Notified [place], [place]  Date Charge Nurse/RN Notified 02/08/22  Time Charge Nurse/RN Notified 1940  Notify: Rapid Response  Name of Rapid Response RN Notified Mindy RN  Date Rapid Response Notified 02/08/22  Time Rapid Response Notified 1940  Document  Patient Outcome Other (Comment)  Assess: SIRS CRITERIA  SIRS Temperature  1  SIRS Pulse 1  SIRS Respirations  1  SIRS WBC 1  SIRS Score Sum  4    Patient with RED MEWS due to rectal temp of 102.7. Rapid response at the bedside.  Dr. Marlowe Sax paged for antipyretic orders, none available at this time. Blankets removed. Room temperature decreased.

## 2022-02-08 NOTE — Progress Notes (Signed)
Inpatient Rehab Admissions Coordinator:     Continue to await insurance auth for CIR. I  will continue to follow for potential admit pending insurance auth.   Clemens Catholic, [place], [place] Admissions Coordinator  [phone] ([place]) [phone] (office)

## 2022-02-08 NOTE — Progress Notes (Signed)
[facility] Progress Note  12 Days Post-Op  Subjective: Denies pain. States her pain is worse at night. She is tolerating PO - tolerating small amounts of food without n/v. Denies abdominal pain, soreness about incision  Objective: Vital signs in last 24 hours: Temp:  [98 F (36.7 C)-99 F (37.2 C)] 99 F (37.2 C) (09/04 0735) Pulse Rate:  [86-104] 86 (09/04 0735) Resp:  [14-29] 20 (09/04 0735) BP: (97-121)/(49-94) 121/94 (09/04 0735) SpO2:  [96 %-100 %] 100 % (09/04 0735) FiO2 (%):  [44 %] 44 % (09/04 0729) Weight:  [73 kg] 73 kg (09/04 0200) Last BM Date : 02/08/22  Intake/Output from previous day: 09/03 0701 - 09/04 0700 In: 1248.2 [P.O.:436; I.V.:812.2] Out: 1500 [Urine:1500] Intake/Output this shift: Total I/O In: 120 [P.O.:120] Out: -   PE: Gen:  Alert Abd: Soft, appropriately tender, nondistended; ostomy viable with thin enteric contents in ostomy bag.  WAV down today - wound 100% granulated. Shallow. Convert to wet to dry dressings     Lab Results:  Recent Labs    02/07/22 0515 02/08/22 0309  WBC 9.2 9.7  HGB 8.8* 8.4*  HCT 28.4* 26.8*  PLT 243 251   BMET Recent Labs    02/07/22 0515 02/08/22 0524  NA 138 138  K 3.7 4.5  CL 93* 91*  CO2 36* 39*  GLUCOSE 135* 117*  BUN 26* 32*  CREATININE 0.57 0.53  CALCIUM 9.2 9.4   PT/INR No results for input(s): "LABPROT", "INR" in the last 72 hours. CMP     Component Value Date/Time   NA 138 02/08/2022 0524   NA 139 11/18/2021 1432   NA 134 (L) 04/25/2017 1216   K 4.5 02/08/2022 0524   K 4.3 04/25/2017 1216   CL 91 (L) 02/08/2022 0524   CO2 39 (H) 02/08/2022 0524   CO2 29 04/25/2017 1216   GLUCOSE 117 (H) 02/08/2022 0524   GLUCOSE 85 04/25/2017 1216   BUN 32 (H) 02/08/2022 0524   BUN 10 11/18/2021 1432   BUN 14.4 04/25/2017 1216   CREATININE 0.53 02/08/2022 0524   CREATININE 0.72 01/17/2018 1347   CREATININE 0.7 04/25/2017 1216   CALCIUM 9.4 02/08/2022 0524   CALCIUM 10.0  04/25/2017 1216   PROT 6.6 02/08/2022 0524   PROT 7.2 04/25/2017 1216   ALBUMIN 1.9 (L) 02/08/2022 0524   ALBUMIN 3.7 04/25/2017 1216   AST 25 02/08/2022 0524   AST 20 01/17/2018 1347   AST 21 04/25/2017 1216   ALT 12 02/08/2022 0524   ALT 10 01/17/2018 1347   ALT 10 04/25/2017 1216   ALKPHOS 100 02/08/2022 0524   ALKPHOS 75 04/25/2017 1216   BILITOT 0.6 02/08/2022 0524   BILITOT 0.5 01/17/2018 1347   BILITOT 0.65 04/25/2017 1216   GFRNONAA >60 02/08/2022 0524   GFRNONAA >60 01/17/2018 1347   GFRAA 90 04/24/2020 1235   GFRAA >60 01/17/2018 1347   Lipase     Component Value Date/Time   LIPASE 77 07/28/2008 1630       Studies/Results: DG Chest Port 1V same Day  Result Date: 02/08/2022 CLINICAL DATA:  Shortness of breath. EXAM: PORTABLE CHEST 1 VIEW COMPARISON:  02/06/2022 FINDINGS: Left arm PICC line remains in appropriate position. Heart size is stable. Infiltrate or atelectasis in the right lung base shows no significant change. Layering right pleural effusion is also stable. Atelectasis or consolidation in the medial left lung base is also unchanged. IMPRESSION: No significant change in right greater than left basilar  atelectasis versus infiltrates, and layering right pleural effusion. Electronically Signed   By: Marlaine Hind M.D.   On: 02/08/2022 09:43   DG CHEST PORT 1 VIEW  Result Date: 02/06/2022 CLINICAL DATA:  Shortness of breath EXAM: PORTABLE CHEST 1 VIEW COMPARISON:  Previous studies including the examination of 02/03/2022 FINDINGS: There is increased density in right mid and right lower lung fields with interval worsening. There is blunting of left lateral CP angle. There are no signs of alveolar pulmonary edema in the visualized lung fields. Evaluation of the right mid and both lower lung fields for infiltrates is limited by effusions. Tip of left PICC line is seen at the junction of superior vena cava and right atrium. There is no pneumothorax. IMPRESSION: Bilateral  pleural effusions, more so on the right side. There is interval increase in amount of right pleural effusion. Possibility of atelectasis/pneumonia in right mid and both lower lung fields is not excluded. There are no new infiltrates in the visualized lung fields. Electronically Signed   By: Elmer Picker M.D.   On: 02/06/2022 14:56    Anti-infectives: Anti-infectives (From admission, onward)    Start     Dose/Rate Route Frequency Ordered Stop   01/26/22 1400  piperacillin-tazobactam (ZOSYN) IVPB 3.375 g        3.375 g 12.5 mL/hr over 240 Minutes Intravenous Every 8 hours 01/26/22 0945 02/02/22 0150   01/10/2022 1030  vancomycin (VANCOREADY) IVPB 750 mg/150 mL  Status:  Discontinued        750 mg 150 mL/hr over 60 Minutes Intravenous Every 24 hours 01/26/2022 0937 01/28/22 1520   01/14/2022 1000  vancomycin (VANCOREADY) IVPB 1500 mg/300 mL  Status:  Discontinued        1,500 mg 150 mL/hr over 120 Minutes Intravenous Every 48 hours 01/23/22 0909 01/31/2022 0937   01/23/22 1030  vancomycin (VANCOREADY) IVPB 1250 mg/250 mL        1,250 mg 166.7 mL/hr over 90 Minutes Intravenous  Once 01/23/22 0907 01/23/22 1259   01/23/22 1000  metroNIDAZOLE (FLAGYL) IVPB 500 mg  Status:  Discontinued        500 mg 100 mL/hr over 60 Minutes Intravenous Every 12 hours 01/23/22 0839 01/26/22 0945   01/23/22 1000  ceFEPIme (MAXIPIME) 2 g in sodium chloride 0.9 % 100 mL IVPB  Status:  Discontinued        2 g 200 mL/hr over 30 Minutes Intravenous Every 12 hours 01/23/22 0901 01/26/22 0945   02/02/2022 1400  micafungin (MYCAMINE) 100 mg in sodium chloride 0.9 % 100 mL IVPB  Status:  Discontinued        100 mg 105 mL/hr over 1 Hours Intravenous Every 24 hours 01/08/2022 1302 01/15/22 1211   01/10/22 1515  piperacillin-tazobactam (ZOSYN) IVPB 3.375 g        3.375 g 12.5 mL/hr over 240 Minutes Intravenous Every 8 hours 01/10/22 1427 01/14/2022 2359   01/04/22 2000  vancomycin (VANCOCIN) IVPB 1000 mg/200 mL premix  Status:   Discontinued        1,000 mg 200 mL/hr over 60 Minutes Intravenous Every 24 hours 01/03/22 1910 01/05/22 1321   01/03/22 2200  ceFEPIme (MAXIPIME) 2 g in sodium chloride 0.9 % 100 mL IVPB        2 g 200 mL/hr over 30 Minutes Intravenous Every 12 hours 01/03/22 1910 01/08/22 2209   01/03/22 1930  vancomycin (VANCOREADY) IVPB 1250 mg/250 mL        1,250 mg 166.7 mL/hr over  90 Minutes Intravenous  Once 01/03/22 1910 01/03/22 2219   01/02/22 2100  azithromycin (ZITHROMAX) 500 mg in sodium chloride 0.9 % 250 mL IVPB        500 mg 250 mL/hr  Intravenous Every 24 hours 12/26/2021 2248 01/06/22 2149   01/02/22 2000  cefTRIAXone (ROCEPHIN) 2 g in sodium chloride 0.9 % 100 mL IVPB  Status:  Discontinued        2 g 200 mL/hr over 30 Minutes Intravenous Every 24 hours 12/09/2021 2248 01/03/22 1733   12/24/2021 2030  cefTRIAXone (ROCEPHIN) 1 g in sodium chloride 0.9 % 100 mL IVPB        1 g 200 mL/hr over 30 Minutes Intravenous  Once 12/11/2021 2025 12/14/2021 2141   12/23/2021 2030  azithromycin (ZITHROMAX) 500 mg in sodium chloride 0.9 % 250 mL IVPB        500 mg 250 mL/hr over 60 Minutes Intravenous  Once 12/06/2021 2025 12/27/2021 2312        Assessment/Plan POD #27/25, S/p exploratory laparotomy,  R colectomy, abthera placement 02/04/2022 Dr. Rosendo Gros S/p exploratory laparotomy, intestinal anastomosis, abdominal closure 01/10/2022 Dr. 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 Redmond Pulling - Ostomy with some thin bilious output. Continue TPN for now.  -soft diet, breeze TID.  Continue TNA until better oral intake - WOC consult for new ileostomy and VAC - Vac MWF - zosyn x5 days postop (end date 8/28), WBC normal - pain improved with adjustment in pain meds.  Given she is AF VSS, will continue to hold on a scan at this time.  -Wound shallow - convert to Wet to dry dressing    FEN: soft, breeze, TNA ID: Zosyn (stop date 8/14), cefepime/flagyl 8/19 >>8/21, Vanc/zosyn 8/21 --> 8/28 VTE: SCD's, hold DOAC/ heparin gtt    Below per TRH/CCM -- Septic shock due to above Acute hypoxic respiratory failure - per CCM, extubated on 8/26 Cardiac arrest 7/30, 7 mins CPR Hx cardiomyopathy COPD Afib, permanent CAP DNR   LOS: 54 days   Nadeen Landau, MD [facility], [place]

## 2022-02-08 NOTE — Progress Notes (Signed)
PHARMACY - TOTAL PARENTERAL NUTRITION CONSULT NOTE  Indication: Intolerance to enteral feeding / anastomotic leak  Patient Measurements: Height: 5' (152.4 cm) Weight: 73 kg (160 lb 15 oz) IBW/kg (Calculated) : 45.5 TPN AdjBW (KG): 53.7 Body mass index is 31.43 kg/m.  Assessment:  84 YOF admited for CAP causing respiratory failure, had a cardiac arrest after admission and was moved to the ICU.  Chest tube placed for parapneumonic effusion, extubated on 8/2. Sent out of ICU on 8/5. On 8/6 she developed ischemic colitis, initially treated conservatively but transferred back to ICU for worsening shock and confusion 8/7.  Now s/p ex lap, R colectomy for ischemic bowel with perforation. Pt was provided TPN from 8/9-8/15. Pt's diet was advanced and pt ate 25-50% of meals from 8/15-8/20. Diet was changed to CLD on 8/20 for IR placement of drain for anastomotic leak. However, drain was not placed because pt acutely decompensated and was intubated and admitted to ICU and made NPO on 8/21. IR placed drain on 8/22 and drainage was positive for enteric contents confirming anastomotic leak. Pharmacy consulted to manage TPN.  9/4: Continues with low po intake - <25% of meals +small amt of Boost Breeze. Plan back to OR today for removal of wound.  Glucose / Insulin: A1c 4.9% - CBGs < 140. Not on SSI Electrolytes: Mg continues at 1.7 (max in TPN), K up to 4.5, Cl 91 (on max Cl), CO2 39, CoCa 11, none in TPN (Ca x Phos ~ 47, goal < 55) Goal K > 4 and Mg > 2 for ileus and Afib Renal: SCr < 1, BUN 32 Hepatic: LFTs / tbili WNL, TG 151, albumin 1.9 Intake / Output; MIVF: UOP 1950 ml yesterday, LBM 9/4, Lasix 60 IV BID.  GI Imaging: 8/6 KUB: Mild gaseous distension favors ileus  8/6 CT: concerning for iscehmic colitis; possible SBO and/or inflammatory ileus  8/7 KUB: diffuse gaseous small bowel and colonic distension 8/20 CT: extraluminal collection of gas and fluid possibly representing an anastomotic leak,  postop fluid collection, or abscess GI Surgeries / Procedures:  8/7 Ex lap, colon resection 8/9 Ex lap, intestinal anastomosis, wound closure 8/22 CT-guided anterior RLQ abscess drain placement 8/23 Ex lap with resection of distal small bowel and ileocolonic anastomosis, creation of end ileostomy, and lysis of adhesions   Central access: PICC placed 01/12/22 TPN start date:  01/28/2022-01/19/22, restarted 8/23  Nutritional Goals: RD Estimated Needs Total Energy Estimated Needs: 1700-1900 kcals Total Protein Estimated Needs: 95-115 g Total Fluid Estimated Needs: >/= 1.5 L  Current Nutrition:  TPN  8/30 FLD  Plan:  Continue concentrated TPN at goal rate of 65 ml/hr to provide 104g AA, 234g CHO, 61g lipids, and 1821 kCal, meeting 100% of nutrition needs Electrolytes in TPN: Decrease K slightly to 65 mEq/L and Phos to 16 mmol/L.  Continue Na 48mq, Ca 0 mEq/L, max Mg at 129m/L, max CL. Add standard MVI and trace elements to TPN  Continue CBG checks Q12H to monitor for hypoglycemia Standard TPN labs on Mon/Thurs Daily BMP while on Lasix scheduled F/u diet advancement and po intake  CaSherlon HandingPharmD, BCPS Please see amion for complete clinical pharmacist phone list 02/08/2022, 7:31 AM

## 2022-02-08 NOTE — Progress Notes (Signed)
Physical Therapy Treatment Patient Details Name: [name] MRN: [id number] DOB: Oct 14, 1940 Today's Date: 02/08/2022   History of Present Illness 81 y.o. female admitted 12/09/2021 with CAP.  7/30 AMS, head CT negative for acute abnormality. Code blue 7/30, ROSC achieved after 7 min CPR, pt intubated. Chest tube insertion 7/31; pt removed CT 8/1. ETT 7/30-8/2. Continued AMS, hallucinations overnight 8/2; head CT 8/2 remains negative for acute abnormality. EEG 8/2 with no seizure activity. Improved cognition 8/5, but persistent tachycardia/afib with RVR. 8/6 ischemic colitis. 8/7 shock with intubation and to OR for ex lap. 8/9 abdomen closure. 8/10 extubation. 8/21 respiratory distress with reintubation. 8/23 return to OR for ex lap and small bowel resection with ileostomy. Extubated 8/26 and thoracentesis. 8/28 respiratory distress with urgent thoracentesis. PMHx:Afib, HTN, breast CA, melanoma, asthma, bilateral TKA, anxiety.    PT Comments    Pt demonstrating improving activity tolerance today, practiced repeated transfer training and short distance gait with close chair follow. Pt stood at sink x1.5 minutes with posterior truncal support and mod cuing for form as well. Pt is motivated to return to PLOF, states today that she felt "good" about how session went as well as anxiety with mobility. PT continuing to recommend AIR, PT to continue to follow acutely.      Recommendations for follow up therapy are one component of a multi-disciplinary discharge planning process, led by the attending physician.  Recommendations may be updated based on patient status, additional functional criteria and insurance authorization.  Follow Up Recommendations  Acute inpatient rehab (3hours/day) Can patient physically be transported by private vehicle: Yes   Assistance Recommended at Discharge Frequent or constant Supervision/Assistance  Patient can return home with the following A lot of help with walking and/or  transfers;A lot of help with bathing/dressing/bathroom;Assistance with cooking/housework;Assist for transportation;Help with stairs or ramp for entrance   Equipment Recommendations  Hospital bed;Wheelchair (measurements PT)    Recommendations for Other Services       Precautions / Restrictions Precautions Precautions: Fall;Other (comment) Precaution Comments: ileostomy, PICC line Restrictions Weight Bearing Restrictions: No     Mobility  Bed Mobility Overal bed mobility: Needs Assistance Bed Mobility: Supine to Sit, Sit to Supine     Supine to sit: HOB elevated, Mod assist Sit to supine: Mod assist, +2 for physical assistance   General bed mobility comments: increased time and asssitance with BLEs    Transfers Overall transfer level: Needs assistance Equipment used: Rolling walker (2 wheels) Transfers: Sit to/from Stand, Bed to chair/wheelchair/BSC Sit to Stand: Mod assist, +2 physical assistance, Min assist   Step pivot transfers: Min assist, +2 physical assistance       General transfer comment: mod +1 or min +2 for sit<>stand for power up, rise, steadying, correcting posterior bias. STS x4 throughout session, step pivot from bed to chair.    Ambulation/Gait Ambulation/Gait assistance: Mod assist, +2 safety/equipment Gait Distance (Feet): 5 Feet Assistive device: Rolling walker (2 wheels) Gait Pattern/deviations: Step-to pattern, Decreased stride length, Trunk flexed Gait velocity: decr     General Gait Details: mod +2 safety for steadying, correcting posterior bias, chair follow   Stairs             Wheelchair Mobility    Modified Rankin (Stroke Patients Only)       Balance Overall balance assessment: Needs assistance Sitting-balance support: No upper extremity supported, Feet supported Sitting balance-Leahy Scale: Fair Sitting balance - Comments: minguard sitting EOB   Standing balance support: Bilateral upper extremity supported  Standing  balance-Leahy Scale: Poor Standing balance comment: reliant on RW or sink for support, standing tolerance x1.5 minutes standing at sink                            Cognition Arousal/Alertness: Awake/alert Behavior During Therapy: Flat affect Overall Cognitive Status: Impaired/Different from baseline Area of Impairment: Following commands, Safety/judgement, Awareness, Problem solving                 Orientation Level: Disoriented to, Time Current Attention Level: Selective   Following Commands: Follows one step commands consistently, Follows multi-step commands with increased time Safety/Judgement: Decreased awareness of safety, Decreased awareness of deficits Awareness: Emergent Problem Solving: Slow processing General Comments: slow processing with increased time to follow directions, pt can be difficult to follow in conversation and hard to discern if pt is making jokes or does not understand what is being asked of her.        Exercises      General Comments General comments (skin integrity, edema, etc.): 5LO2 throughout session, SPO2 sensor not reading but cues for pursed lip breathing, rest breaks to recover dyspnea.      Pertinent Vitals/Pain Pain Assessment Pain Assessment: Faces Faces Pain Scale: Hurts even more Pain Location: abdomen Pain Descriptors / Indicators: Grimacing Pain Intervention(s): Limited activity within patient's tolerance, Monitored during session, Repositioned    Home Living                          Prior Function            PT Goals (current goals can now be found in the care plan section) Acute Rehab PT Goals Patient Stated Goal: return home PT Goal Formulation: With patient Time For Goal Achievement: 02/12/22 Potential to Achieve Goals: Good Progress towards PT goals: Progressing toward goals    Frequency    Min 3X/week      PT Plan Current plan remains appropriate    Co-evaluation PT/OT/SLP  Co-Evaluation/Treatment: Yes Reason for Co-Treatment: To address functional/ADL transfers;For patient/therapist safety;Complexity of the patient's impairments (multi-system involvement) PT goals addressed during session: Mobility/safety with mobility;Balance OT goals addressed during session: ADL's and self-care      AM-PAC PT "6 Clicks" Mobility   Outcome Measure  Help needed turning from your back to your side while in a flat bed without using bedrails?: A Lot Help needed moving from lying on your back to sitting on the side of a flat bed without using bedrails?: A Lot Help needed moving to and from a bed to a chair (including a wheelchair)?: A Lot Help needed standing up from a chair using your arms (e.g., wheelchair or bedside chair)?: A Lot Help needed to walk in hospital room?: A Lot Help needed climbing 3-5 steps with a railing? : Total 6 Click Score: 11    End of Session Equipment Utilized During Treatment: Oxygen Activity Tolerance: Patient limited by fatigue Patient left: in bed;with call bell/phone within reach;with bed alarm set;with family/visitor present (transport preparing to take pt to CT) Nurse Communication: Mobility status PT Visit Diagnosis: Other abnormalities of gait and mobility (R26.89);Difficulty in walking, not elsewhere classified (R26.2);Muscle weakness (generalized) (M62.81)     Time: 2542-7062 PT Time Calculation (min) (ACUTE ONLY): 47 min  Charges:  $Therapeutic Activity: 8-22 mins                     Ramyah Pankowski  Chauncey Cruel, PT DPT Acute Rehabilitation Services Pager [phone]  Office [phone]   Louis Matte 02/08/2022, 1:37 PM

## 2022-02-08 NOTE — Consult Note (Signed)
[facility] Nurse wound follow up Patient receiving care in [place]. Surgeon Dr. Dema Severin, to bedside at time of [facility] removal. Wound type: abdominal surgical wound Measurement: na Wound bed: beefy red Drainage (amount, consistency, odor) serosanginous in cannister Periwound: intact Dressing procedure/placement/frequency: All black foam removed from abdominal wound bed. Saline moistened gauze dressing placed into wound, covered with ABD pad, taped in place.  Order for bid saline gauze dressing placed in computer. Val Riles, RN, MSN, CWOCN, CNS-BC, pager [phone]

## 2022-02-08 NOTE — Progress Notes (Signed)
Speech Language Pathology Treatment: Dysphagia  Patient Details Name: [name] MRN: [id number] DOB: [birth year] Today's Date: 02/08/2022 Time: 7793-9030 SLP Time Calculation (min) (ACUTE ONLY): 12 min  Assessment / Plan / Recommendation Clinical Impression  [name] was seen to evaluate toleration of soft diet.  She was lethargic, difficult to fully rouse for participation. She sipped some apple juice but declined any solid food and reported feeling woozy.  Based on last week's FEES, laryngeal function and airway protection looked reliable.  There have been no further concerns for aspiration and no s/s witnessed today. Provided with warm blanket because she was cold and repositioned for comfort. Informed RN re: her complaints of wooziness.  SLP will follow.  HPI HPI: 80 y.o. female admitted 12/21/2021 with CAP.  7/30 AMS, head CT negative for acute abnormality. Code blue 7/30, ROSC achieved after 7 min CPR, pt intubated. Chest tube insertion 7/31; pt removed CT 8/1. ETT 7/30-8/2. Continued AMS, hallucinations overnight 8/2; head CT 8/2 remains negative for acute abnormality. EEG 8/2 with no seizure activity. Improved cognition 8/5, but persistent tachycardia/afib with RVR. 8/6 ischemic colitis. 8/7 shock with intubation and to OR for ex lap. 8/9 abdomen closure. 8/10 extubation. 8/21 respiratory distress with reintubation. 8/23 return to OR for ex lap and small bowel resection with ileostomy. 8/26 extubation after consult with Palliative medicine; no plans for reintubation. Pt has been followed for swallowing since 8/3. Diet before last intubation was dysphagia 3/thin liquids. PMHx:Afib, HTN, breast CA, melanoma, asthma, bilateral TKA, anxiety.      SLP Plan  Continue with current plan of care      Recommendations for follow up therapy are one component of a multi-disciplinary discharge planning process, led by the attending physician.  Recommendations may be updated based on patient status,  additional functional criteria and insurance authorization.    Recommendations  Diet recommendations: Other(comment) (soft diet per surgery) Liquids provided via: Cup;Straw Medication Administration: Whole meds with liquid Supervision: Patient able to self feed Postural Changes and/or Swallow Maneuvers: Seated upright 90 degrees                Oral Care Recommendations: Oral care BID Follow Up Recommendations: No SLP follow up Assistance recommended at discharge: Frequent or constant Supervision/Assistance SLP Visit Diagnosis: Dysphagia, unspecified (R13.10) Plan: Continue with current plan of care         [name] L. Gabriella [name], MA CCC/SLP Clinical Specialist - Acute Care SLP Acute Rehabilitation Services Office number [phone]   [name]  02/08/2022, 4:32 PM

## 2022-02-09 DIAGNOSIS — J189 Pneumonia, unspecified organism: Secondary | ICD-10-CM | POA: Diagnosis not present

## 2022-02-09 DIAGNOSIS — I469 Cardiac arrest, cause unspecified: Secondary | ICD-10-CM | POA: Diagnosis not present

## 2022-02-09 DIAGNOSIS — I4811 Longstanding persistent atrial fibrillation: Secondary | ICD-10-CM | POA: Diagnosis not present

## 2022-02-09 DIAGNOSIS — J9601 Acute respiratory failure with hypoxia: Secondary | ICD-10-CM | POA: Diagnosis not present

## 2022-02-09 LAB — PROTIME-INR
INR: 1.2 (ref 0.8–1.2)
Prothrombin Time: 14.8 seconds (ref 11.4–15.2)

## 2022-02-09 LAB — PHOSPHORUS: Phosphorus: 4.1 mg/dL (ref 2.5–4.6)

## 2022-02-09 LAB — LACTIC ACID, PLASMA: Lactic Acid, Venous: 0.7 mmol/L (ref 0.5–1.9)

## 2022-02-09 LAB — GLUCOSE, CAPILLARY
Glucose-Capillary: 111 mg/dL — ABNORMAL HIGH (ref 70–99)
Glucose-Capillary: 117 mg/dL — ABNORMAL HIGH (ref 70–99)
Glucose-Capillary: 107 mg/dL — ABNORMAL HIGH (ref 70–99)

## 2022-02-09 LAB — CBC WITH DIFFERENTIAL/PLATELET
Abs Immature Granulocytes: 0.2 10*3/uL — ABNORMAL HIGH (ref 0.00–0.07)
Basophils Absolute: 0 10*3/uL (ref 0.0–0.1)
Basophils Relative: 0 %
Eosinophils Absolute: 0.1 10*3/uL (ref 0.0–0.5)
Eosinophils Relative: 1 %
HCT: 27.5 % — ABNORMAL LOW (ref 36.0–46.0)
Hemoglobin: 8.4 g/dL — ABNORMAL LOW (ref 12.0–15.0)
Immature Granulocytes: 2 %
Lymphocytes Relative: 9 %
Lymphs Abs: 1.1 10*3/uL (ref 0.7–4.0)
MCH: 31.2 pg (ref 26.0–34.0)
MCHC: 30.5 g/dL (ref 30.0–36.0)
MCV: 102.2 fL — ABNORMAL HIGH (ref 80.0–100.0)
Monocytes Absolute: 1 10*3/uL (ref 0.1–1.0)
Monocytes Relative: 9 %
Neutro Abs: 8.9 10*3/uL — ABNORMAL HIGH (ref 1.7–7.7)
Neutrophils Relative %: 79 %
Platelets: 271 10*3/uL (ref 150–400)
RBC: 2.69 MIL/uL — ABNORMAL LOW (ref 3.87–5.11)
RDW: 19.4 % — ABNORMAL HIGH (ref 11.5–15.5)
WBC: 11.3 10*3/uL — ABNORMAL HIGH (ref 4.0–10.5)
nRBC: 0.3 % — ABNORMAL HIGH (ref 0.0–0.2)

## 2022-02-09 LAB — COMPREHENSIVE METABOLIC PANEL
ALT: 11 U/L (ref 0–44)
AST: 22 U/L (ref 15–41)
Albumin: 1.9 g/dL — ABNORMAL LOW (ref 3.5–5.0)
Alkaline Phosphatase: 100 U/L (ref 38–126)
Anion gap: 7 (ref 5–15)
BUN: 37 mg/dL — ABNORMAL HIGH (ref 8–23)
CO2: 38 mmol/L — ABNORMAL HIGH (ref 22–32)
Calcium: 9.3 mg/dL (ref 8.9–10.3)
Chloride: 91 mmol/L — ABNORMAL LOW (ref 98–111)
Creatinine, Ser: 0.61 mg/dL (ref 0.44–1.00)
GFR, Estimated: >60 mL/min (ref >60)
Glucose, Bld: 127 mg/dL — ABNORMAL HIGH (ref 70–99)
Potassium: 4 mmol/L (ref 3.5–5.1)
Sodium: 136 mmol/L (ref 135–145)
Total Bilirubin: 0.8 mg/dL (ref 0.3–1.2)
Total Protein: 6.4 g/dL — ABNORMAL LOW (ref 6.5–8.1)

## 2022-02-09 LAB — BASIC METABOLIC PANEL
Anion gap: 8 (ref 5–15)
BUN: 37 mg/dL — ABNORMAL HIGH (ref 8–23)
CO2: 36 mmol/L — ABNORMAL HIGH (ref 22–32)
Calcium: 9.5 mg/dL (ref 8.9–10.3)
Chloride: 93 mmol/L — ABNORMAL LOW (ref 98–111)
Creatinine, Ser: 0.64 mg/dL (ref 0.44–1.00)
GFR, Estimated: >60 mL/min (ref >60)
Glucose, Bld: 107 mg/dL — ABNORMAL HIGH (ref 70–99)
Potassium: 4.5 mmol/L (ref 3.5–5.1)
Sodium: 137 mmol/L (ref 135–145)

## 2022-02-09 LAB — APTT: aPTT: 39 seconds — ABNORMAL HIGH (ref 24–36)

## 2022-02-09 LAB — MAGNESIUM: Magnesium: 1.8 mg/dL (ref 1.7–2.4)

## 2022-02-09 MED ORDER — TRACE MINERALS CU-MN-SE-ZN 300-55-60-3000 MCG/ML IV SOLN
INTRAVENOUS | Status: AC
  Filled 2022-02-09: qty 707.2

## 2022-02-09 NOTE — Consult Note (Signed)
[facility] Nurse ostomy follow up Patient receiving care in 3E08 Stoma type/location: RLQ ileostomy Stomal assessment/size: 1 1/4 inch Peristomal assessment: intact Treatment options for stomal/peristomal skin: barrier ring Output: liquid green Ostomy pouching: 1pc. Flex convex Education provided: none. No family members present. Patient not teachable. POC to SNF Enrolled patient in [place] Start Discharge program: No  WOC will continue to follow.   Cathlean Marseilles Tamala Julian, MSN, RN, [place], Lysle Pearl, [facility] Wound Treatment Associate Pager [phone]

## 2022-02-09 NOTE — Progress Notes (Signed)
[facility] Progress Note  13 Days Post-Op  Subjective: Overnight events noted - febrile and tachycardic. She is not orientated to place and situation. She denies worsening abdominal pain, n/v  Objective: Vital signs in last 24 hours: Temp:  [98 F (36.7 C)-102.7 F (39.3 C)] 98 F (36.7 C) (09/05 0721) Pulse Rate:  [89-106] 94 (09/05 0721) Resp:  [19-30] 20 (09/05 0721) BP: (100-178)/(57-142) 113/57 (09/05 0721) SpO2:  [80 %-100 %] 97 % (09/05 0721) Last BM Date : 02/08/22  Intake/Output from previous day: 09/04 0701 - 09/05 0700 In: 2275.3 [P.O.:360; I.V.:1665.3; IV Piggyback:250] Out: 2000 [Urine:1850; Stool:150] Intake/Output this shift: No intake/output data recorded.  PE: Gen:  Alert, NAD Abd: Soft, appropriately tender, nondistended; ostomy viable with thin enteric contents in ostomy bag.  Dressing c/d/I - wound 100% granulated. Shallow   Lab Results:  Recent Labs    02/08/22 0309 02/08/22 2151  WBC 9.7 12.1*  HGB 8.4* 8.2*  HCT 26.8* 26.3*  PLT 251 275    BMET Recent Labs    02/08/22 0524 02/09/22 0025  NA 138 136  K 4.5 4.0  CL 91* 91*  CO2 39* 38*  GLUCOSE 117* 127*  BUN 32* 37*  CREATININE 0.53 0.61  CALCIUM 9.4 9.3    PT/INR Recent Labs    02/09/22 0025  LABPROT 14.8  INR 1.2   CMP     Component Value Date/Time   NA 136 02/09/2022 0025   NA 139 11/18/2021 1432   NA 134 (L) 04/25/2017 1216   K 4.0 02/09/2022 0025   K 4.3 04/25/2017 1216   CL 91 (L) 02/09/2022 0025   CO2 38 (H) 02/09/2022 0025   CO2 29 04/25/2017 1216   GLUCOSE 127 (H) 02/09/2022 0025   GLUCOSE 85 04/25/2017 1216   BUN 37 (H) 02/09/2022 0025   BUN 10 11/18/2021 1432   BUN 14.4 04/25/2017 1216   CREATININE 0.61 02/09/2022 0025   CREATININE 0.72 01/17/2018 1347   CREATININE 0.7 04/25/2017 1216   CALCIUM 9.3 02/09/2022 0025   CALCIUM 10.0 04/25/2017 1216   PROT 6.4 (L) 02/09/2022 0025   PROT 7.2 04/25/2017 1216   ALBUMIN 1.9 (L) 02/09/2022 0025    ALBUMIN 3.7 04/25/2017 1216   AST 22 02/09/2022 0025   AST 20 01/17/2018 1347   AST 21 04/25/2017 1216   ALT 11 02/09/2022 0025   ALT 10 01/17/2018 1347   ALT 10 04/25/2017 1216   ALKPHOS 100 02/09/2022 0025   ALKPHOS 75 04/25/2017 1216   BILITOT 0.8 02/09/2022 0025   BILITOT 0.5 01/17/2018 1347   BILITOT 0.65 04/25/2017 1216   GFRNONAA >60 02/09/2022 0025   GFRNONAA >60 01/17/2018 1347   GFRAA 90 04/24/2020 1235   GFRAA >60 01/17/2018 1347   Lipase     Component Value Date/Time   LIPASE 77 07/28/2008 1630       Studies/Results: CT ABDOMEN PELVIS WO CONTRAST  Result Date: 02/09/2022 CLINICAL DATA:  Sepsis. EXAM: CT ABDOMEN AND PELVIS WITHOUT CONTRAST TECHNIQUE: Multidetector CT imaging of the abdomen and pelvis was performed following the standard protocol without IV contrast. RADIATION DOSE REDUCTION: This exam was performed according to the departmental dose-optimization program which includes automated exposure control, adjustment of the mA and/or kV according to patient size and/or use of iterative reconstruction technique. COMPARISON:  CT abdomen pelvis dated 01/24/2022. FINDINGS: Evaluation of this exam is limited in the absence of intravenous contrast. Lower chest: Trace left and partially visualized small right pleural effusions.  There is consolidative changes of the visualized right lung base. Mild cardiomegaly. The tip of a central venous line noted in the right atrium. No intra-abdominal free air. Diffuse mesenteric stranding and edema. Hepatobiliary: Cirrhosis. No biliary dilatation. Small gallstone. No evidence of acute cholecystitis by CT. Pancreas: Unremarkable. No pancreatic ductal dilatation or surrounding inflammatory changes. Spleen: Normal in size without focal abnormality. Adrenals/Urinary Tract: The adrenal glands unremarkable. There is no hydronephrosis on either side. There is a 3 mm nonobstructing right renal interpolar calculus or cortical calcification. The  visualized ureters appear unremarkable. The urinary bladder is decompressed around a Foley catheter. Stomach/Bowel: There is postsurgical changes of the bowel with a right lower quadrant ostomy. There is sigmoid diverticulosis without active inflammatory changes. No evidence of bowel obstruction. Vascular/Lymphatic: Moderate aortoiliac atherosclerotic disease. The IVC is unremarkable. No portal venous gas. There is no adenopathy. Reproductive: The uterus is anteverted.  No adnexal masses. Other: There is a loculated fluid collection in the anterior abdomen measuring approximately 14 x 6 cm in greatest axial dimensions and 13 cm in craniocaudal length which may represent a seroma or developing abscess. Musculoskeletal: There is diffuse subcutaneous edema. Midline vertical anterior pelvic wall open surgical incision. Osteopenia with degenerative changes of the spine. No acute osseous pathology. IMPRESSION: 1. Postsurgical changes of the bowel with a right lower quadrant ostomy. No evidence of bowel obstruction. 2. A loculated fluid collection in the anterior abdomen may represent a seroma or developing abscess. 3. Cirrhosis. 4. Cholelithiasis. 5. Sigmoid diverticulosis. 6. Trace left and partially visualized small right pleural effusions with consolidative changes of the visualized right lung base. 7. Aortic Atherosclerosis (ICD10-I70.0). Electronically Signed   By: Anner Crete M.D.   On: 02/09/2022 00:27   CT CHEST WO CONTRAST  Result Date: 02/08/2022 CLINICAL DATA:  Chronic dyspnea EXAM: CT CHEST WITHOUT CONTRAST TECHNIQUE: Multidetector CT imaging of the chest was performed following the standard protocol without IV contrast. RADIATION DOSE REDUCTION: This exam was performed according to the departmental dose-optimization program which includes automated exposure control, adjustment of the mA and/or kV according to patient size and/or use of iterative reconstruction technique. COMPARISON:  Previous CT done  on 01/03/2022 and chest radiograph done today FINDINGS: Cardiovascular: Heart is enlarged in size. There are scattered calcifications in thoracic aorta. Coronary artery calcifications are seen. Main pulmonary artery measures 3.3 cm suggesting possible pulmonary arterial hypertension. There is ectasia of ascending thoracic aorta measuring 3.6 cm. Mediastinum/Nodes: No significant lymphadenopathy seen in mediastinum. There is subcutaneous stranding in left anterior chest wall close to the axilla PICC line is noted in left upper extremity with its tip at the junction of superior vena cava and right atrium. Lungs/Pleura: There is linear patchy infiltrate in right upper lobe. There are patchy infiltrates in right middle lobe with atrial improvement. There is moderate size right pleural effusion. There is interval improvement in aeration in right lower lobe. There is residual partial atelectasis in inferior aspect of right lower lobe. Small patchy infiltrates are seen in left lower lobe and lingula. There is minimal left pleural effusion. There is no pneumothorax. Upper Abdomen: Unremarkable. Musculoskeletal: No acute findings are seen. Reconstruction prostheses are seen in both breasts. IMPRESSION: There is interval decrease in amount of right pleural effusion with moderate residual right pleural effusion. There is interval improvement in aeration in right middle lobe and right lower lobe suggesting decrease in atelectasis. Still, fairly extensive infiltrates are seen in right upper lobe, right middle lobe and right lower lobe suggesting  atelectasis/pneumonia. Follow-up studies until complete clearing occurs should be considered to rule out any underlying neoplastic process. There are small patchy infiltrates in the lingula and left lower lobe suggesting subsegmental atelectasis/pneumonia. Minimal left pleural effusion is seen. Cardiomegaly. Coronary artery calcifications are seen. There is ectasia of main pulmonary  artery suggesting pulmonary arterial hypertension. There is subcutaneous stranding in the anterior chest wall close to the left axilla suggesting contusion or cellulitis. Electronically Signed   By: Elmer Picker M.D.   On: 02/08/2022 15:41   DG Chest Port 1V same Day  Result Date: 02/08/2022 CLINICAL DATA:  Shortness of breath. EXAM: PORTABLE CHEST 1 VIEW COMPARISON:  02/06/2022 FINDINGS: Left arm PICC line remains in appropriate position. Heart size is stable. Infiltrate or atelectasis in the right lung base shows no significant change. Layering right pleural effusion is also stable. Atelectasis or consolidation in the medial left lung base is also unchanged. IMPRESSION: No significant change in right greater than left basilar atelectasis versus infiltrates, and layering right pleural effusion. Electronically Signed   By: Marlaine Hind M.D.   On: 02/08/2022 09:43    Anti-infectives: Anti-infectives (From admission, onward)    Start     Dose/Rate Route Frequency Ordered Stop   02/09/22 2200  vancomycin (VANCOREADY) IVPB 750 mg/150 mL        750 mg 150 mL/hr over 60 Minutes Intravenous Every 24 hours 02/08/22 2143     02/08/22 2230  piperacillin-tazobactam (ZOSYN) IVPB 3.375 g        3.375 g 12.5 mL/hr over 240 Minutes Intravenous Every 8 hours 02/08/22 2143     02/08/22 2230  vancomycin (VANCOREADY) IVPB 1500 mg/300 mL        1,500 mg 150 mL/hr over 120 Minutes Intravenous  Once 02/08/22 2143 02/09/22 0011   01/26/22 1400  piperacillin-tazobactam (ZOSYN) IVPB 3.375 g        3.375 g 12.5 mL/hr over 240 Minutes Intravenous Every 8 hours 01/26/22 0945 02/02/22 0150   01/24/2022 1030  vancomycin (VANCOREADY) IVPB 750 mg/150 mL  Status:  Discontinued        750 mg 150 mL/hr over 60 Minutes Intravenous Every 24 hours 01/10/2022 0937 01/28/22 1520   01/29/2022 1000  vancomycin (VANCOREADY) IVPB 1500 mg/300 mL  Status:  Discontinued        1,500 mg 150 mL/hr over 120 Minutes Intravenous Every 48  hours 01/23/22 0909 01/16/2022 0937   01/23/22 1030  vancomycin (VANCOREADY) IVPB 1250 mg/250 mL        1,250 mg 166.7 mL/hr over 90 Minutes Intravenous  Once 01/23/22 0907 01/23/22 1259   01/23/22 1000  metroNIDAZOLE (FLAGYL) IVPB 500 mg  Status:  Discontinued        500 mg 100 mL/hr over 60 Minutes Intravenous Every 12 hours 01/23/22 0839 01/26/22 0945   01/23/22 1000  ceFEPIme (MAXIPIME) 2 g in sodium chloride 0.9 % 100 mL IVPB  Status:  Discontinued        2 g 200 mL/hr over 30 Minutes Intravenous Every 12 hours 01/23/22 0901 01/26/22 0945   01/15/2022 1400  micafungin (MYCAMINE) 100 mg in sodium chloride 0.9 % 100 mL IVPB  Status:  Discontinued        100 mg 105 mL/hr over 1 Hours Intravenous Every 24 hours 01/05/2022 1302 01/15/22 1211   01/10/22 1515  piperacillin-tazobactam (ZOSYN) IVPB 3.375 g        3.375 g 12.5 mL/hr over 240 Minutes Intravenous Every 8 hours 01/10/22 1427 01/15/2022  2359   01/04/22 2000  vancomycin (VANCOCIN) IVPB 1000 mg/200 mL premix  Status:  Discontinued        1,000 mg 200 mL/hr over 60 Minutes Intravenous Every 24 hours 01/03/22 1910 01/05/22 1321   01/03/22 2200  ceFEPIme (MAXIPIME) 2 g in sodium chloride 0.9 % 100 mL IVPB        2 g 200 mL/hr over 30 Minutes Intravenous Every 12 hours 01/03/22 1910 01/08/22 2209   01/03/22 1930  vancomycin (VANCOREADY) IVPB 1250 mg/250 mL        1,250 mg 166.7 mL/hr over 90 Minutes Intravenous  Once 01/03/22 1910 01/03/22 2219   01/02/22 2100  azithromycin (ZITHROMAX) 500 mg in sodium chloride 0.9 % 250 mL IVPB        500 mg 250 mL/hr  Intravenous Every 24 hours 12/30/2021 2248 01/06/22 2149   01/02/22 2000  cefTRIAXone (ROCEPHIN) 2 g in sodium chloride 0.9 % 100 mL IVPB  Status:  Discontinued        2 g 200 mL/hr over 30 Minutes Intravenous Every 24 hours 01/02/2022 2248 01/03/22 1733   12/30/2021 2030  cefTRIAXone (ROCEPHIN) 1 g in sodium chloride 0.9 % 100 mL IVPB        1 g 200 mL/hr over 30 Minutes Intravenous  Once  12/31/2021 2025 12/29/2021 2141   12/06/2021 2030  azithromycin (ZITHROMAX) 500 mg in sodium chloride 0.9 % 250 mL IVPB        500 mg 250 mL/hr over 60 Minutes Intravenous  Once 12/13/2021 2025 12/31/2021 2312        Assessment/Plan POD #29/27, S/p exploratory laparotomy,  R colectomy, abthera placement 01/08/2022 Dr. Rosendo Gros S/p exploratory laparotomy, intestinal anastomosis, abdominal closure 01/21/2022 Dr. 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 Redmond Pulling - Ostomy with thin output.  -soft diet, breeze TID.  Continue TNA until better oral intake - WOC consult for new ileostomy - Vac switched to BID WTD 9/4 - zosyn x5 days postop (end date 8/28) now with fever overnight and vanc/zosyn restarted - CT 9/5 without obstruction. Fluid collection in anterior abdomen 14x6 cm seroma vs abscess. Will review with MD if appropriate for drainage    FEN: soft, breeze, TNA ID: Zosyn (stop date 8/14), cefepime/flagyl 8/19 >>8/21, Vanc/zosyn 8/21 --> 8/28, vanc/zosyn 9/4> VTE: SCD's, hold DOAC/ heparin gtt, LMWH '100mg'$     Below per TRH/CCM -- Septic shock due to above Acute hypoxic respiratory failure - per CCM, extubated on 8/26 Cardiac arrest 7/30, 7 mins CPR Hx cardiomyopathy COPD Afib, permanent CAP DNR   LOS: 39 days   Winferd Humphrey, [facility] Surgery 02/09/2022, 8:26 AM Please see Amion for pager number during day hours 7:00am-4:30pm

## 2022-02-09 NOTE — TOC Progression Note (Signed)
Transition of Care [facility]) - Progression Note    Patient Details  Name: [name] MRN: [id number] Date of Birth: 1941/02/28  Transition of [facility]) CM/SW Contact  Zenon Mayo, RN Phone Number: 02/09/2022, 12:09 PM  Clinical Narrative:    Patient is febrile, tacy, not oriented to place and situation, WOC  following for new iliostomy, wound vac has been dc'd and now getting wet to dry. Bid. TOC following.    Expected Discharge Plan: [place] Barriers to Discharge: Continued Medical Work up  Expected Discharge Plan and Services Expected Discharge Plan: [place] In-house Referral: Clinical Social Work Discharge Planning Services: CM Consult Post Acute Care Choice: [place] arrangements for the past 2 months: Single Family Home                                       Social Determinants of Health (SDOH) Interventions    Readmission Risk Interventions     No data to display

## 2022-02-09 NOTE — Progress Notes (Signed)
See rapid response and hospitalist note regarding overnight events. Per on call hospitalist, these labs are not necessary as they were drawn as a component of the code sepsis. AM attending informed of need to "cancel" morning labs.

## 2022-02-09 NOTE — Progress Notes (Addendum)
Nutrition Follow-up  DOCUMENTATION CODES:   Severe malnutrition in context of acute illness/injury  INTERVENTION:   -TPN management per pharmacy -Continue Boost Breeze po TID, each supplement provides 250 kcal and 9 grams of protein  -Continue Magic cup TID with meals, each supplement provides 290 kcal and 9 grams of protein  -Continue snacks TID  NUTRITION DIAGNOSIS:   Severe Malnutrition related to acute illness as evidenced by mild fat depletion, mild muscle depletion, moderate muscle depletion, severe fat depletion.  Ongoing  GOAL:   Patient will meet greater than or equal to 90% of their needs  Met with TPN  MONITOR:   PO intake, Diet advancement, Supplement acceptance, Weight trends, Skin (TPN)  REASON FOR ASSESSMENT:   Consult, Ventilator Enteral/tube feeding initiation and management  ASSESSMENT:   81 yo female admitted with acute respiratory failure related to COPD exacerbation and pneumonia, went into PEA arrest and required intubation. PMH includes A.fib, breast cancer, HTN, GERD, asthma, melanoma  Reviewed I/O's: +275 ml x 24 hours and -3.4 L since 01/26/22  UOP: 1.9 L x 24 hours  Ileostomy output: 150 ml x 24 hours  Pt lying in bed at time of visit. She did not arouse to voice or touch. No family members at bedside.   Observed lunch tray, which was untouched. Pt with inadequate oral intake; documented meal completions 0-25%.    Pt currently receiving TPN at 65 ml/hr, which provides 821 kcals and 104 grams protein, meeting 100% of needs. Per pharmacy notes, plan to cycle TPN at 1800, which will provide 1717 kcals and 106 grams protein, meeting 100% of estimated kcal and protein needs.   Per chart review, pt continues with medical decline and plan to transition to comfort care if she continues to decline.   Medications reviewed and include lasix and remeron.   Labs reviewed: CBGS: 111 (inpatient orders for glycemic control are none).    Diet Order:    Diet Order             DIET SOFT Room service appropriate? Yes; Fluid consistency: Thin  Diet effective now                   EDUCATION NEEDS:   Education needs have been addressed  Skin:  Skin Assessment: Skin Integrity Issues: Skin Integrity Issues:: Stage II Stage II: nose Wound Vac: midline abd wound-dehisced Incisions: new ilesostomy Other: partial thickness wound to nares of nose  Last BM:  02/09/22 (250 ml via colostomy)  Height:   Ht Readings from Last 1 Encounters:  02/01/22 5' (1.524 m)    Weight:   Wt Readings from Last 1 Encounters:  02/08/22 73 kg   BMI:  Body mass index is 31.43 kg/m.  Estimated Nutritional Needs:   Kcal:  1700-1900 kcals  Protein:  95-115 g  Fluid:  >/= 1.5 L    Loistine Chance, RD, LDN, [place] Registered Dietitian II Certified Diabetes Care and Education Specialist Please refer to [facility] for RD and/or RD on-call/weekend/after hours pager

## 2022-02-09 NOTE — Progress Notes (Signed)
SLP Cancellation Note  Patient Details Name: [name] MRN: [id number] DOB: [birth year]   Cancelled treatment:    Pt lethargic, febrile, now with abdominal abscess.  Will f/u after procedure tomorrow or next date.  Florice Hindle L. Tivis [name], MA CCC/SLP Clinical Specialist - Acute Care SLP Acute Rehabilitation Services Office number [phone]        [name] 02/09/2022, 4:24 PM

## 2022-02-09 NOTE — Progress Notes (Signed)
PHARMACY - TOTAL PARENTERAL NUTRITION CONSULT NOTE  Indication: Intolerance to enteral feeding / anastomotic leak  Patient Measurements: Height: 5' (152.4 cm) Weight: 73 kg (160 lb 15 oz) IBW/kg (Calculated) : 45.5 TPN AdjBW (KG): 53.7 Body mass index is 31.43 kg/m.  Assessment:  7 YOF admited for CAP causing respiratory failure, had a cardiac arrest after admission and was moved to the ICU.  Chest tube placed for parapneumonic effusion, extubated on 8/2. Sent out of ICU on 8/5. On 8/6 she developed ischemic colitis, initially treated conservatively but transferred back to ICU for worsening shock and confusion 8/7.  Now s/p ex lap, R colectomy for ischemic bowel with perforation. Pt was provided TPN from 8/9-8/15. Pt's diet was advanced and pt ate 25-50% of meals from 8/15-8/20. Diet was changed to CLD on 8/20 for IR placement of drain for anastomotic leak. However, drain was not placed because pt acutely decompensated and was intubated and admitted to ICU and made NPO on 8/21. IR placed drain on 8/22 and drainage was positive for enteric contents confirming anastomotic leak. Pharmacy consulted to manage TPN 8/23.  9/4: Continues with low po intake - <25% of meals +small amt of Boost Breeze. Plan back to OR today for removal of wound.  Glucose / Insulin: A1c 4.9% - CBGs < 140. Not on SSI Electrolytes:Na 136, Mg 1.8 (max in TPN), K 4, Cl 91 (on max Cl), CO2 38, CoCa 11 (none in TPN, Ca x Phos ~ 47, goal < 55) Goal K > 4 and Mg > 2 for ileus and Afib Renal: SCr 0.6, BUN 37 Hepatic: LFTs / tbili WNL, TG 151, albumin 1.9 Intake / Output; MIVF: UOP 850 ml/12hr charted, LBM 9/4, Lasix 60 IV BID GI Imaging: 8/6 KUB: Mild gaseous distension favors ileus  8/6 CT: concerning for iscehmic colitis; possible SBO and/or inflammatory ileus  8/7 KUB: diffuse gaseous small bowel and colonic distension 8/20 CT: extraluminal collection of gas and fluid possibly representing an anastomotic leak, postop fluid  collection, or abscess GI Surgeries / Procedures:  8/7 Ex lap, colon resection 8/9 Ex lap, intestinal anastomosis, wound closure 8/22 CT-guided anterior RLQ abscess drain placement 8/23 Ex lap with resection of distal small bowel and ileocolonic anastomosis, creation of end ileostomy, and lysis of adhesions   Central access: PICC placed 01/12/22 TPN start date:  02/02/2022-01/19/22, restarted 8/23  Nutritional Goals: RD Estimated Needs Total Energy Estimated Needs: 1700-1900 kcals Total Protein Estimated Needs: 95-115 g Total Fluid Estimated Needs: >/= 1.5 L  Current Nutrition:  TPN  8/30 FLD 9/1 Soft diet   Plan:  Cycle concentrated goal TPN over 18 hours (rate of 40-80 ml/hr, GIR 1.5-3 mg/kg/min), provides 106 g AA, 224 g CHO, 53 g lipids, and 1717 kCal, meeting 100% of nutrition needs Electrolytes in TPN: Increase Na 25 mEq, Continue K 73 mEq/L, Ca 0 mEq/L, max Mg at 15 mEq/L, Phos 16 mmol/L, max CL. Add standard MVI and trace elements to TPN  Continue CBG checks Q12H to monitor for hypoglycemia Standard TPN labs on Mon/Thurs Daily BMP while on Lasix scheduled F/u diet advancement and po intake, cycle or wean TPN as tolerated   Benetta Spar, PharmD, BCPS, [facility] Clinical Pharmacist  Please check AMION for all [place] phone numbers After 10:00 PM, call [place]

## 2022-02-09 NOTE — Progress Notes (Signed)
Inpatient Rehab Admissions Coordinator:    Pt. With medical decline, currently not appropriate for CIR. [facility] Medicare requested peer to peer for today, but I plan to decline and let it go to appeal (if Pt. Demonstrates improvement).  Clemens Catholic, [place], [place] Admissions Coordinator  [phone] ([place]) [phone] (office)

## 2022-02-09 NOTE — Progress Notes (Signed)
PROGRESS NOTE        PATIENT DETAILS Name: [name] Age: 81 y.o. Sex: female Date of Birth: 02/14/1941 Admit Date: 12/10/2021 Admitting Physician Kayleen Memos, DO [id number], [place] [facility] [place]  Brief Summary: Patient is a 81 y.o.  female with history of persistent atrial fibrillation, HTN, chronic HFpEF, breast cancer on letrozole, pulmonary hypertension, asthma-who presented to the hospital on 7/28 with cough/shortness of breath-she was found to have acute hypoxic respiratory failure due to right lobar PNA.  She was started on IV antibiotics and admitted to the hospitalist service.  On 7/30-she sustained a in-hospital cardiac arrest thought to be due to underlying respiratory decline from pneumonia, she was intubated and transferred to the ICU.  CT chest showed a large right-sided pleural effusion-she underwent chest tube placement.  She was stabilized and transferred back to [facility].  Unfortunately-further hospital course was complicated by development of acute abdomen in the setting of ischemic colon with perforation requiring multiple laparotomies and bowel resection.  She remained in the ICU for while she underwent numerous surgical procedures-once stabilized-she was extubated and transferred back to [facility] on 8/31.  See below for further details.  Significant events: 7/28>> admit to [facility]-sepsis with hypoxia due to PNA.  7/30>> PEA arrest.  Transfer to ICU.  CT chest with worsening pleural effusion. 7/31>> Chest tube placed 8/01>> patient removed chest tube.   8/06>> Transfer to [facility] 8/07>> back to ICU-acute abdomen-hypertension-A-fib RVR.  Found to have ischemic colon with contained perforation-underwent exploratory laparotomy and colon resection. 8/09>> exploratory laparotomy/intestinal anastomosis and abdominal closure. 8/10>> Extubated 8/13 Transfer to [facility] 8/20>> CT A/P with fuid collection in R mid-abdomen with c/f anastomotic leak. 8/21>>  Back to ICU for resp distress 8/22>> CT-guided drain placement (RLQ) for fluid assessment, abscess vs. Anastomotic leak 8/23>> Taken back to OR for ex-lap/concern for anastomotic leak; extensive LOA with findings of feculent peritonitis with +leak; enterotomies requiring additional SBR and end ileostomy. 8/25>> Made DNR after palliative meeting 8/26>> Extubated. Right thoracentesis with removal of 600 cc 8/28>> respiratory distress, urgent thoracentesis removal of 600 cc, diuresis with Lasix, came off BiPAP 8/31>> transferred to [facility]. 9/02>> slightly worsening shortness of breath at rest-CXR with persistent right pleural effusion-discussed with PCCM-no need for thoracocentesis.  DNR reconfirmed-patient does not want BiPAP as well. 9/04>> febrile/tachycardic-CT abdomen/pelvis-started on empiric antibiotics.  Significant studies: 7/29>> CT angio chest: No PE, large areas of consolidation with air bronchogram involving right lower/right middle lobe. 7/30>> CT head: No acute intracranial abnormality. 7/30>> CT chest/abdomen/pelvis: Worsening right pleural effusion, panchamber cardiomegaly 7/31>> Echo: EF 25-30%, changes consistent with chronic Takotsubo cardiomyopathy.  RV Eduardo Osier function is mildly reduced. 8/02>> CT head: No acute intracranial process 8/02>> Spot EEG: No seizures. 8/06>> CT abdomen pelvis: Interval development of pneumatosis involving cecum/ascending colon-concerning for ischemic colitis.  Associated small bowel obstruction. 8/14>> Limited echo: EF 60-65%.  RV systolic function moderately reduced. 8/17>> left upper extremity Doppler: No DVT. 8/20>> CT abdomen/pelvis: Surgical changes of right hemicolectomy-extraluminal collection of gas/fluid in the right mid abdomen.  Extensive infiltrative changes within the mesentery of the right abdomen.   9/04>> CT chest: Decreasing right pleural effusion-interval improvement of aeration in right middle/right lower lobe-still fairly extensive  infiltrates in the right upper lobe.  Subcutaneous stranding in the anterior chest wall close to the left axilla suggesting contusion/cellulitis. 9/05>> CT  abdomen/pelvis: Postsurgical changes-loculated fluid collection in anterior abdominal area present to seroma/developing abscess.   Significant microbiology data: 7/28>> blood culture: No growth 7/29>> urine culture: No growth 7/30>> BAL culture: No growth 7/31>> pleural fluid culture: No growth 8/18>> blood culture: No growth 8/19>> urine culture: Enterococcus faecalis 8/21>> tracheal aspirate: No growth 8/21>> blood culture: No growth 8/22>> abscess abdomen: Enterococcus/Klebsiella 8/26>> pleural fluid culture: No growth 9/04>> blood culture: No growth 9/05>> urine culture: Pending  Consults: PCCM, cardiology, palliative care, general surgery, interventional radiology, neurology  Subjective: Slightly lethargic but awake/alert-febrile overnight.  Was working on her breakfast when I saw her earlier this morning.  Objective: Vitals: Blood pressure 112/63, pulse (!) 107, temperature 97.8 F (36.6 C), temperature source Oral, resp. rate 17, height 5' (1.524 m), weight 73 kg, SpO2 98 %.   Exam: Gen Exam:Alert awake-not in any distress-slightly tachypneic though. HEENT:atraumatic, normocephalic Chest: B/L clear to auscultation anteriorly CVS:S1S2 regular Abdomen:soft non tender, non distended Extremities:+ edema Neurology: Non focal Skin: no rash   Pertinent Labs/Radiology:    Latest Ref Rng & Units 02/09/2022   10:02 AM 02/08/2022    9:51 PM 02/08/2022    3:09 AM  CBC  WBC 4.0 - 10.5 K/uL 11.3  12.1  9.7   Hemoglobin 12.0 - 15.0 g/dL 8.4  8.2  8.4   Hematocrit 36.0 - 46.0 % 27.5  26.3  26.8   Platelets 150 - 400 K/uL 271  275  251     Lab Results  Component Value Date   NA 137 02/09/2022   K 4.5 02/09/2022   CL 93 (L) 02/09/2022   CO2 36 (H) 02/09/2022      Assessment/Plan: Sepsis: Febrile overnight-possibly due  to PNA-possible intra-abdominal fluid collection/abscess.  On empiric antibiotics-follow cultures.  Difficult situation-have had extensive discussion with patient/family over the past several days-if she deteriorates-all aware that her wishes are to transition to comfort measures.  Acute hypoxic respiratory failure Multifocal PNA Parapneumonic effusion-s/p chest tube placement-removed 8/1 Continues to have episodes of labored breathing-unclear whether this is from PNA/volume overload or anxiety.  On empiric antibiotics-started 9/4-on IV Lasix.  Continue supportive care.   Ischemic bowel with perforation-s/p right colectomy and exploratory laparotomy on 8/7. S/p exploratory laparotomy-bowel anastomosis on 8/9 Complicated by anastomotic leak with peritonitis-s/p exploratory laparotomy, extensive LOA, resection of distal small bowel, and ileocolonic anastomosis, creation of end ileostomy on 8/23 Diet being advanced-however appetite poor-has been started on Remeron.  General surgery following and directing care.  Remains on TNA.  Diet advance but she has very poor appetite-Remeron started.  Remains on TNA.  General surgery following and directing care.    Septic shock with bowel perforation and intra-abdominal abscess:  Sepsis physiology had resolved-she had completed a course of IV antibiotics-now febrile overnight-restarted on IV antibiotics on 9/4.  Awaiting cultures.  PEA arrest Felt to be due to respiratory issues-continue telemetry monitoring.  Initial echo showed changes consistent with Takotsubo cardiomyopathy-thankfully a limited echo few days later showed normal EF.  Takotsubo cardiomyopathy with recovered EF Acute on chronic HFpEF Volume overload due to third spacing/hypoalbuminemia Volume status has improved-on IV Lasix-continue beta-blocker-continue to follow electrolytes/intake/output and weights.    Persistent atrial fibrillation:  On beta-blocker-anticoagulated with Lovenox-once  not thought to require any surgical procedures-we will switch to oral agent.  History of moderate pulmonary hypertension Will need close outpatient follow-up with pulmonology and cardiology.  HTN BP stable on metoprolol.  History of bronchial asthma: Not in exacerbation-continue bronchodilators.  Acute metabolic  encephalopathy Due to septic shock/peritonitis/hypoxemia.  Somewhat lethargic this morning but was able to answer questions appropriately.  EEG negative for seizures.  Due to ongoing sepsis physiology-her encephalopathy may worsen.  Normocytic anemia Due to critical illness-transfuse if significant drop.  No evidence of blood loss.  History of breast cancer-s/p right mastectomy on 01/24/2017-completed adjuvant radiation November 2018 resume letrozole when able.  Severe debility/deconditioning PT/OT Probably SNF on discharge.  Palliative care: DNR in place-if she develops worsening respiratory failure-she does not desire BiPAP-understands that apart from transitioning to comfort measures-no further options available.  She has redeveloped sepsis physiology-we will have to watch her closely-continue IV antibiotics-if no improvement-likely will need comfort measures.  Nutrition Status: Nutrition Problem: Severe Malnutrition Etiology: acute illness Signs/Symptoms: mild fat depletion, mild muscle depletion, moderate muscle depletion, severe fat depletion Interventions: TPN, Boost Breeze, Refer to RD note for recommendations   Pressure Ulcer: Pressure Injury 01/30/2022 Nose Left;Right Stage 2 -  Partial thickness loss of dermis presenting as a shallow open injury with a red, pink wound bed without slough. raw and pink area with scabs very painful (Active)  01/31/2022 1147  Location: Nose  Location Orientation: Left;Right  Staging: Stage 2 -  Partial thickness loss of dermis presenting as a shallow open injury with a red, pink wound bed without slough.  Wound Description  (Comments): raw and pink area with scabs very painful  Present on Admission: No  Dressing Type None 02/08/22 0800   Obesity: Estimated body mass index is 31.43 kg/m as calculated from the following:   Height as of this encounter: 5' (1.524 m).   Weight as of this encounter: 73 kg.   Code status:   Code Status: DNR   DVT Prophylaxis: Place and maintain sequential compression device Start: 01/12/2022 1701 SCDs Start: 01/14/2022 0812 therapeutic Lovenox  Family Communication: Son-[phone]-called on 9/5-no response-subsequently called son [phone]-Long discussion-he will update other family members.   Disposition Plan: Status is: Inpatient Remains inpatient appropriate because: Severity of illness-diet being advanced-on IV diuretics for volume overload.  Now with sepsis physiology-back on IV antibiotics.   Planned Discharge Destination: CIR evaluation in progress.   Diet: Diet Order             DIET SOFT Room service appropriate? Yes; Fluid consistency: Thin  Diet effective now                     Antimicrobial agents: Anti-infectives (From admission, onward)    Start     Dose/Rate Route Frequency Ordered Stop   02/09/22 2200  vancomycin (VANCOREADY) IVPB 750 mg/150 mL        750 mg 150 mL/hr over 60 Minutes Intravenous Every 24 hours 02/08/22 2143     02/08/22 2230  piperacillin-tazobactam (ZOSYN) IVPB 3.375 g        3.375 g 12.5 mL/hr over 240 Minutes Intravenous Every 8 hours 02/08/22 2143     02/08/22 2230  vancomycin (VANCOREADY) IVPB 1500 mg/300 mL        1,500 mg 150 mL/hr over 120 Minutes Intravenous  Once 02/08/22 2143 02/09/22 0011   01/26/22 1400  piperacillin-tazobactam (ZOSYN) IVPB 3.375 g        3.375 g 12.5 mL/hr over 240 Minutes Intravenous Every 8 hours 01/26/22 0945 02/02/22 0150   01/19/2022 1030  vancomycin (VANCOREADY) IVPB 750 mg/150 mL  Status:  Discontinued        750 mg 150 mL/hr over 60 Minutes Intravenous Every 24 hours  02/01/2022 0937  01/28/22 1520   01/09/2022 1000  vancomycin (VANCOREADY) IVPB 1500 mg/300 mL  Status:  Discontinued        1,500 mg 150 mL/hr over 120 Minutes Intravenous Every 48 hours 01/23/22 0909 01/17/2022 0937   01/23/22 1030  vancomycin (VANCOREADY) IVPB 1250 mg/250 mL        1,250 mg 166.7 mL/hr over 90 Minutes Intravenous  Once 01/23/22 0907 01/23/22 1259   01/23/22 1000  metroNIDAZOLE (FLAGYL) IVPB 500 mg  Status:  Discontinued        500 mg 100 mL/hr over 60 Minutes Intravenous Every 12 hours 01/23/22 0839 01/26/22 0945   01/23/22 1000  ceFEPIme (MAXIPIME) 2 g in sodium chloride 0.9 % 100 mL IVPB  Status:  Discontinued        2 g 200 mL/hr over 30 Minutes Intravenous Every 12 hours 01/23/22 0901 01/26/22 0945   01/31/2022 1400  micafungin (MYCAMINE) 100 mg in sodium chloride 0.9 % 100 mL IVPB  Status:  Discontinued        100 mg 105 mL/hr over 1 Hours Intravenous Every 24 hours 01/14/2022 1302 01/15/22 1211   01/10/22 1515  piperacillin-tazobactam (ZOSYN) IVPB 3.375 g        3.375 g 12.5 mL/hr over 240 Minutes Intravenous Every 8 hours 01/10/22 1427 01/24/2022 2359   01/04/22 2000  vancomycin (VANCOCIN) IVPB 1000 mg/200 mL premix  Status:  Discontinued        1,000 mg 200 mL/hr over 60 Minutes Intravenous Every 24 hours 01/03/22 1910 01/05/22 1321   01/03/22 2200  ceFEPIme (MAXIPIME) 2 g in sodium chloride 0.9 % 100 mL IVPB        2 g 200 mL/hr over 30 Minutes Intravenous Every 12 hours 01/03/22 1910 01/08/22 2209   01/03/22 1930  vancomycin (VANCOREADY) IVPB 1250 mg/250 mL        1,250 mg 166.7 mL/hr over 90 Minutes Intravenous  Once 01/03/22 1910 01/03/22 2219   01/02/22 2100  azithromycin (ZITHROMAX) 500 mg in sodium chloride 0.9 % 250 mL IVPB        500 mg 250 mL/hr  Intravenous Every 24 hours 12/30/2021 2248 01/06/22 2149   01/02/22 2000  cefTRIAXone (ROCEPHIN) 2 g in sodium chloride 0.9 % 100 mL IVPB  Status:  Discontinued        2 g 200 mL/hr over 30 Minutes Intravenous Every 24  hours 12/07/2021 2248 01/03/22 1733   01/02/2022 2030  cefTRIAXone (ROCEPHIN) 1 g in sodium chloride 0.9 % 100 mL IVPB        1 g 200 mL/hr over 30 Minutes Intravenous  Once 12/05/2021 2025 12/31/2021 2141   01/03/2022 2030  azithromycin (ZITHROMAX) 500 mg in sodium chloride 0.9 % 250 mL IVPB        500 mg 250 mL/hr over 60 Minutes Intravenous  Once 12/05/2021 2025 12/22/2021 2312        MEDICATIONS: Scheduled Meds:  arformoterol  15 mcg Nebulization BID   budesonide (PULMICORT) nebulizer solution  0.25 mg Nebulization BID   enoxaparin (LOVENOX) injection  100 mg Subcutaneous Q24H   feeding supplement  1 Container Oral TID BM   furosemide  60 mg Intravenous Q12H   lidocaine  10 mL Other Once   metoprolol succinate  50 mg Oral Daily   mirtazapine  7.5 mg Oral QHS   mupirocin ointment   Nasal BID   pantoprazole (PROTONIX) IV  40 mg Intravenous Q24H   revefenacin  175 mcg Nebulization Daily  saline  1 Application Each Nare O9B   Continuous Infusions:  sodium chloride 10 mL/hr at 02/08/22 2006   sodium chloride 250 mL (02/07/22 0959)   piperacillin-tazobactam (ZOSYN)  IV 3.375 g (02/09/22 0535)   TPN ADULT (ION) 65 mL/hr at 35/32/99 2426   TPN CYCLIC-ADULT (ION)     vancomycin     PRN Meds:.acetaminophen, guaiFENesin, haloperidol lactate, levalbuterol, lip balm, methocarbamol, morphine CONCENTRATE, ondansetron (ZOFRAN) IV, oxyCODONE, sodium chloride, sodium chloride, sodium chloride flush, white petrolatum   I have personally reviewed following labs and imaging studies  LABORATORY DATA: CBC: Recent Labs  Lab 02/06/22 0500 02/07/22 0515 02/08/22 0309 02/08/22 2151 02/09/22 1002  WBC 9.0 9.2 9.7 12.1* 11.3*  NEUTROABS 5.8 5.9 6.5 8.3* 8.9*  HGB 8.8* 8.8* 8.4* 8.2* 8.4*  HCT 27.8* 28.4* 26.8* 26.3* 27.5*  MCV 98.2 100.4* 105.5* 101.2* 102.2*  PLT 225 243 251 275 271     Basic Metabolic Panel: Recent Labs  Lab 02/04/22 0611 02/05/22 0650 02/06/22 0500 02/07/22 0515  02/08/22 0524 02/09/22 0025 02/09/22 1002  NA 140 136 138 138 138 136 137  K 4.4 4.1 3.9 3.7 4.5 4.0 4.5  CL 96* 91* 94* 93* 91* 91* 93*  CO2 39* 39* 38* 36* 39* 38* 36*  GLUCOSE 117* 124* 121* 135* 117* 127* 107*  BUN 26* 29* 27* 26* 32* 37* 37*  CREATININE 0.45 0.49 0.53 0.57 0.53 0.61 0.64  CALCIUM 9.2 9.3 9.4 9.2 9.4 9.3 9.5  MG 2.1 1.9 1.7 1.7 2.0 1.8  --   PHOS 3.6  --   --   --  4.3 4.1  --      GFR: Estimated Creatinine Clearance: 51.7 mL/min (by C-G formula based on SCr of 0.64 mg/dL).  Liver Function Tests: Recent Labs  Lab 02/03/22 0549 02/08/22 0524 02/09/22 0025  AST _0 ALT _1 ALKPHOS 92 100 100  BILITOT 0.4 0.6 0.8  PROT 5.9* 6.6 6.4*  ALBUMIN 2.0* 1.9* 1.9*    No results for input(s): "LIPASE", "AMYLASE" in the last 168 hours. No results for input(s): "AMMONIA" in the last 168 hours.  Coagulation Profile: Recent Labs  Lab 02/09/22 0025  INR 1.2    Cardiac Enzymes: No results for input(s): "CKTOTAL", "CKMB", "CKMBINDEX", "TROPONINI" in the last 168 hours.  BNP (last 3 results) No results for input(s): "PROBNP" in the last 8760 hours.  Lipid Profile: Recent Labs    02/08/22 0309 02/08/22 0552  TRIG 493* 151*     Thyroid Function Tests: No results for input(s): "TSH", "T4TOTAL", "FREET4", "T3FREE", "THYROIDAB" in the last 72 hours.  Anemia Panel: No results for input(s): "VITAMINB12", "FOLATE", "FERRITIN", "TIBC", "IRON", "RETICCTPCT" in the last 72 hours.  Urine analysis:    Component Value Date/Time   COLORURINE YELLOW 02/08/2022 2304   APPEARANCEUR HAZY (A) 02/08/2022 2304   LABSPEC 1.009 02/08/2022 2304   PHURINE 6.0 02/08/2022 2304   GLUCOSEU NEGATIVE 02/08/2022 2304   HGBUR MODERATE (A) 02/08/2022 2304   BILIRUBINUR NEGATIVE 02/08/2022 2304   KETONESUR NEGATIVE 02/08/2022 2304   PROTEINUR NEGATIVE 02/08/2022 2304   UROBILINOGEN 0.2 01/22/2013 1345   NITRITE NEGATIVE 02/08/2022 2304   LEUKOCYTESUR LARGE (A)  02/08/2022 2304    Sepsis Labs: Lactic Acid, Venous    Component Value Date/Time   LATICACIDVEN 0.7 02/09/2022 0025    MICROBIOLOGY: Recent Results (from the past 240 hour(s))  Culture, blood (Routine X 2) w Reflex to ID Panel  Status: None (Preliminary result)   Collection Time: 02/08/22  9:48 PM   Specimen: BLOOD  Result Value Ref Range Status   Specimen Description BLOOD RIGHT ANTECUBITAL  Final   Special Requests   Final    BOTTLES DRAWN AEROBIC AND ANAEROBIC Blood Culture adequate volume   Culture   Final    NO GROWTH < 12 HOURS Performed at [facility], [address], [place], [place] [address]    Report Status PENDING  Incomplete  Culture, blood (Routine X 2) w Reflex to ID Panel     Status: None (Preliminary result)   Collection Time: 02/08/22  9:50 PM   Specimen: BLOOD RIGHT HAND  Result Value Ref Range Status   Specimen Description BLOOD RIGHT HAND  Final   Special Requests   Final    BOTTLES DRAWN AEROBIC AND ANAEROBIC Blood Culture adequate volume   Culture   Final    NO GROWTH < 12 HOURS Performed at [facility], [place] [address] [place], [place] [address]    Report Status PENDING  Incomplete    RADIOLOGY STUDIES/RESULTS: CT ABDOMEN PELVIS WO CONTRAST  Result Date: 02/09/2022 CLINICAL DATA:  Sepsis. EXAM: CT ABDOMEN AND PELVIS WITHOUT CONTRAST TECHNIQUE: Multidetector CT imaging of the abdomen and pelvis was performed following the standard protocol without IV contrast. RADIATION DOSE REDUCTION: This exam was performed according to the departmental dose-optimization program which includes automated exposure control, adjustment of the mA and/or kV according to patient size and/or use of iterative reconstruction technique. COMPARISON:  CT abdomen pelvis dated 01/24/2022. FINDINGS: Evaluation of this exam is limited in the absence of intravenous contrast. Lower chest: Trace left and partially visualized small right pleural effusions. There is  consolidative changes of the visualized right lung base. Mild cardiomegaly. The tip of a central venous line noted in the right atrium. No intra-abdominal free air. Diffuse mesenteric stranding and edema. Hepatobiliary: Cirrhosis. No biliary dilatation. Small gallstone. No evidence of acute cholecystitis by CT. Pancreas: Unremarkable. No pancreatic ductal dilatation or surrounding inflammatory changes. Spleen: Normal in size without focal abnormality. Adrenals/Urinary Tract: The adrenal glands unremarkable. There is no hydronephrosis on either side. There is a 3 mm nonobstructing right renal interpolar calculus or cortical calcification. The visualized ureters appear unremarkable. The urinary bladder is decompressed around a Foley catheter. Stomach/Bowel: There is postsurgical changes of the bowel with a right lower quadrant ostomy. There is sigmoid diverticulosis without active inflammatory changes. No evidence of bowel obstruction. Vascular/Lymphatic: Moderate aortoiliac atherosclerotic disease. The IVC is unremarkable. No portal venous gas. There is no adenopathy. Reproductive: The uterus is anteverted.  No adnexal masses. Other: There is a loculated fluid collection in the anterior abdomen measuring approximately 14 x 6 cm in greatest axial dimensions and 13 cm in craniocaudal length which may represent a seroma or developing abscess. Musculoskeletal: There is diffuse subcutaneous edema. Midline vertical anterior pelvic wall open surgical incision. Osteopenia with degenerative changes of the spine. No acute osseous pathology. IMPRESSION: 1. Postsurgical changes of the bowel with a right lower quadrant ostomy. No evidence of bowel obstruction. 2. A loculated fluid collection in the anterior abdomen may represent a seroma or developing abscess. 3. Cirrhosis. 4. Cholelithiasis. 5. Sigmoid diverticulosis. 6. Trace left and partially visualized small right pleural effusions with consolidative changes of the  visualized right lung base. 7. Aortic Atherosclerosis (ICD10-I70.0). Electronically Signed   By: Anner Crete M.D.   On: 02/09/2022 00:27   CT CHEST WO CONTRAST  Result  Date: 02/08/2022 CLINICAL DATA:  Chronic dyspnea EXAM: CT CHEST WITHOUT CONTRAST TECHNIQUE: Multidetector CT imaging of the chest was performed following the standard protocol without IV contrast. RADIATION DOSE REDUCTION: This exam was performed according to the departmental dose-optimization program which includes automated exposure control, adjustment of the mA and/or kV according to patient size and/or use of iterative reconstruction technique. COMPARISON:  Previous CT done on 01/03/2022 and chest radiograph done today FINDINGS: Cardiovascular: Heart is enlarged in size. There are scattered calcifications in thoracic aorta. Coronary artery calcifications are seen. Main pulmonary artery measures 3.3 cm suggesting possible pulmonary arterial hypertension. There is ectasia of ascending thoracic aorta measuring 3.6 cm. Mediastinum/Nodes: No significant lymphadenopathy seen in mediastinum. There is subcutaneous stranding in left anterior chest wall close to the axilla PICC line is noted in left upper extremity with its tip at the junction of superior vena cava and right atrium. Lungs/Pleura: There is linear patchy infiltrate in right upper lobe. There are patchy infiltrates in right middle lobe with atrial improvement. There is moderate size right pleural effusion. There is interval improvement in aeration in right lower lobe. There is residual partial atelectasis in inferior aspect of right lower lobe. Small patchy infiltrates are seen in left lower lobe and lingula. There is minimal left pleural effusion. There is no pneumothorax. Upper Abdomen: Unremarkable. Musculoskeletal: No acute findings are seen. Reconstruction prostheses are seen in both breasts. IMPRESSION: There is interval decrease in amount of right pleural effusion with moderate  residual right pleural effusion. There is interval improvement in aeration in right middle lobe and right lower lobe suggesting decrease in atelectasis. Still, fairly extensive infiltrates are seen in right upper lobe, right middle lobe and right lower lobe suggesting atelectasis/pneumonia. Follow-up studies until complete clearing occurs should be considered to rule out any underlying neoplastic process. There are small patchy infiltrates in the lingula and left lower lobe suggesting subsegmental atelectasis/pneumonia. Minimal left pleural effusion is seen. Cardiomegaly. Coronary artery calcifications are seen. There is ectasia of main pulmonary artery suggesting pulmonary arterial hypertension. There is subcutaneous stranding in the anterior chest wall close to the left axilla suggesting contusion or cellulitis. Electronically Signed   By: Elmer Picker M.D.   On: 02/08/2022 15:41   DG Chest Port 1V same Day  Result Date: 02/08/2022 CLINICAL DATA:  Shortness of breath. EXAM: PORTABLE CHEST 1 VIEW COMPARISON:  02/06/2022 FINDINGS: Left arm PICC line remains in appropriate position. Heart size is stable. Infiltrate or atelectasis in the right lung base shows no significant change. Layering right pleural effusion is also stable. Atelectasis or consolidation in the medial left lung base is also unchanged. IMPRESSION: No significant change in right greater than left basilar atelectasis versus infiltrates, and layering right pleural effusion. Electronically Signed   By: Marlaine Hind M.D.   On: 02/08/2022 09:43     LOS: 50 days   Oren Binet, MD  [facility]    To contact the attending provider between 7A-7P or the covering provider during after hours 7P-7A, please log into the web site [email or web address] and access using universal  password for that web site. If you do not have the password, please call the hospital operator.  02/09/2022, 11:24 AM

## 2022-02-09 NOTE — Progress Notes (Signed)
Chief Complaint Patient was seen in consultation today for abdominal abscess   Referring Physician(s): Richard Miu PA-C  Supervising Physician: Aletta Edouard  Patient Status: [facility] - In-pt  History of Present Illness: [name] is a 81 y.o. female with hx of ischemia bowel with perforation. Has been to the OR multiple times, most recently 8/23. Has now developed fever and elevated WBC. CT performed shows new large anterior abdominal fluid collection. IR is asked to place drain PMHx, meds, labs, imaging, allergies reviewed. Husband at bedside.   Past Medical History:  Diagnosis Date   Anemia    history of   Anxiety    Arthritis    Asthma    Breast cancer ([place])    Cancer ([place])    skin cancers, one melanoma    Complication of anesthesia    patient woke up during colonoscopy   Concussion    8 yrs. 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    Keratosis, actinic    Melanoma ([place])    Persistent atrial fibrillation ([place]) 12/16/2016   CHA2DS2VASC score 3   Pneumonia    hx of     Past Surgical History:  Procedure Laterality Date   brachial skin removal due to deforimity Bilateral    BREAST ENHANCEMENT SURGERY Left 11/14/2018   Procedure: LEFT BREAST AUGMENTATION;  Surgeon: [name], MD;  Location: [place];  Service: Plastics;  Laterality: Left;   BREAST RECONSTRUCTION Right 06/26/2018   BREAST RECONSTRUCTION WITH PLACEMENT OF TISSUE EXPANDER AND ALLODERM Right 06/26/2018   Procedure: RIGHT BREAST RECONSTRUCTION WITH PLACEMENT OF TISSUE EXPANDER;  Surgeon: [name], MD;  Location: [place];  Service: Plastics;   Laterality: Right;   BREAST RECONSTRUCTION WITH PLACEMENT OF TISSUE EXPANDER AND FLEX HD (ACELLULAR HYDRATED DERMIS) Right 01/28/2017    RIGHT BREAST RECONSTRUCTION WITH PLACEMENT OF TISSUE EXPANDER AND ALLODERM;  Surgeon: [name], MD;  Location: [place];  Service: Plastics;  Laterality: Right;   BUNIONECTOMY     left    CATARACT EXTRACTION     bilaterla cataract surgery    COLON RESECTION N/A 01/05/2022   Procedure: COLON RESECTION;  Surgeon: Ralene Ok, MD;  Location: [place];  Service: General;  Laterality: N/A;   COLONOSCOPY W/ POLYPECTOMY     JOINT REPLACEMENT     KNEE ARTHROSCOPY     left    LAPAROTOMY N/A 01/26/2022   Procedure: EXPLORATORY LAPAROTOMY;  Surgeon: Ralene Ok, MD;  Location: [place];  Service: General;  Laterality: N/A;   LAPAROTOMY N/A 01/20/2022   Procedure: EXPLORATORY LAPAROTOMY WITH INTESTINAL ANASTOMOSIS AND ABDOMINAL CLOSURE;  Surgeon: Ralene Ok, MD;  Location: [place];  Service: General;  Laterality: N/A;   LAPAROTOMY N/A 01/24/2022   Procedure: EXPLORATORY LAPAROTOMY WITH PARTIAL COLECTOMY , iLEOSTOMY;  Surgeon: Greer Pickerel, MD;  Location: [place];  Service: General;  Laterality: N/A;   LATISSIMUS FLAP TO BREAST Right 06/26/2018   Procedure: LATISSIMUS FLAP TO RIGHT CHEST;  Surgeon: [name], MD;  Location: [place];  Service: Plastics;  Laterality: Right;   LYSIS OF ADHESION N/A 02/02/2022  Procedure: LYSIS OF ADHESIONS;  Surgeon: Greer Pickerel, MD;  Location: [place];  Service: General;  Laterality: N/A;   MASTECTOMY Right    MASTECTOMY W/ SENTINEL NODE BIOPSY Right 01/28/2017   Procedure: RIGHT TOTAL MASTECTOMY WITH SENTINEL LYMPH NODE BIOPSY;  Surgeon: Excell Seltzer, MD;  Location: [place];  Service: General;  Laterality: Right;   MASTOPEXY Left 11/14/2018   Procedure: LEFT BREAST MASTOPEXY;  Surgeon: [name], MD;  Location: [place];  Service: Plastics;  Laterality: Left;   MELANOMA EXCISION Left    polyp  removed from uterus      REMOVAL OF TISSUE EXPANDER AND PLACEMENT OF IMPLANT Right 11/14/2018   Procedure: REMOVAL OF RIGHT TISSUE EXPANDER AND PLACEMENT OF SALINE IMPLANT;  Surgeon: [name], MD;  Location: [place];  Service: Plastics;  Laterality: Right;   right wrist pinning      skin cancers removed     TISSUE EXPANDER PLACEMENT Right 02/27/2018   Procedure: REMOVAL OF TISSUE EXPANDER;  Surgeon: [name], MD;  Location: [place];  Service: Plastics;  Laterality: Right;   TOTAL KNEE ARTHROPLASTY Right 01/29/2013   Procedure: RIGHT TOTAL KNEE ARTHROPLASTY;  Surgeon: Gearlean Alf, MD;  Location: [facility];  Service: Orthopedics;  Laterality: Right;   TOTAL KNEE ARTHROPLASTY Left 08/29/2017   Procedure: LEFT TOTAL KNEE ARTHROPLASTY;  Surgeon: Gaynelle Arabian, MD;  Location: [facility];  Service: Orthopedics;  Laterality: Left;    Allergies: Chlorhexidine gluconate, Doxycycline, Codeine, Doxycycline, Nsaids, Nsaids, Tylenol [acetaminophen], Adhesive [tape], Chlorhexidine, Codeine, and Tylenol [acetaminophen]  Medications:  Current Facility-Administered Medications:    0.9 %  sodium chloride infusion, 250 mL, Intravenous, Continuous, White, Sharon Mt, MD, Last Rate: 10 mL/hr at 02/08/22 2006, Infusion Verify at 02/08/22 2006   0.9 %  sodium chloride infusion, 250 mL, Intravenous, Continuous, Ileana Roup, MD, Last Rate: 10 mL/hr at 02/07/22 0959, 250 mL at 02/07/22 0959   acetaminophen (TYLENOL) suppository 650 mg, 650 mg, Rectal, Q6H PRN, Shela Leff, MD, 650 mg at 02/09/22 0927   arformoterol (BROVANA) nebulizer solution 15 mcg, 15 mcg, Nebulization, BID, Ileana Roup, MD, 15 mcg at 02/09/22 0838   budesonide (PULMICORT) nebulizer solution 0.25 mg, 0.25 mg, Nebulization, BID, Ileana Roup, MD, 0.25 mg at 02/09/22 0838   enoxaparin (LOVENOX) injection 100 mg, 100 mg, Subcutaneous, Q24H, Candee Furbish, MD, 100 mg at 02/09/22 1058    feeding supplement (BOOST / RESOURCE BREEZE) liquid 1 Container, 1 Container, Oral, TID BM, Rigoberto Noel, MD, 1 Container at 02/08/22 1031   furosemide (LASIX) injection 60 mg, 60 mg, Intravenous, Q12H, Ghimire, Shanker M, MD, 60 mg at 02/09/22 1058   guaiFENesin (ROBITUSSIN) 100 MG/5ML liquid 10 mL, 10 mL, Per Tube, Q4H PRN, Ileana Roup, MD, 10 mL at 02/07/22 1233   haloperidol lactate (HALDOL) injection 2 mg, 2 mg, Intravenous, Q6H PRN, Ileana Roup, MD, 2 mg at 01/16/22 0051   levalbuterol (XOPENEX) nebulizer solution 0.63 mg, 0.63 mg, Nebulization, Q3H PRN, Ileana Roup, MD, 0.63 mg at 02/08/22 2105   lidocaine (XYLOCAINE) 2 % (with pres) injection 200 mg, 10 mL, Other, Once, White, Sharon Mt, MD   lip balm (CARMEX) ointment, , Topical, PRN, Ileana Roup, MD   methocarbamol (ROBAXIN) tablet 750 mg, 750 mg, Oral, Q8H PRN, Jonetta Osgood, MD, 750 mg at 02/07/22 0020   metoprolol succinate (TOPROL-XL) 24 hr tablet 50 mg, 50 mg, Oral, Daily, Candee Furbish, MD, 50 mg  at 02/08/22 0802   mirtazapine (REMERON) tablet 7.5 mg, 7.5 mg, Oral, QHS, Ghimire, Shanker M, MD, 7.5 mg at 02/07/22 2131   morphine CONCENTRATE 10 MG/0.5ML oral solution 5 mg, 5 mg, Oral, Q4H PRN, Jonetta Osgood, MD, 5 mg at 02/08/22 0802   mupirocin ointment (BACTROBAN) 2 %, , Nasal, BID, Ileana Roup, MD, Given at 02/09/22 1059   ondansetron (ZOFRAN) injection 4 mg, 4 mg, Intravenous, Q6H PRN, Ileana Roup, MD, 4 mg at 02/02/22 4742   oxyCODONE (Oxy IR/ROXICODONE) immediate release tablet 5-10 mg, 5-10 mg, Oral, Q6H PRN, Saverio Danker, PA-C, 10 mg at 02/09/22 0640   pantoprazole (PROTONIX) injection 40 mg, 40 mg, Intravenous, Q24H, Mannam, Praveen, MD, 40 mg at 02/09/22 1058   piperacillin-tazobactam (ZOSYN) IVPB 3.375 g, 3.375 g, Intravenous, Q8H, Ghimire, Shanker M, MD, Last Rate: 12.5 mL/hr at 02/09/22 1419, 3.375 g at 02/09/22 1419   revefenacin (YUPELRI) nebulizer  solution 175 mcg, 175 mcg, Nebulization, Daily, Ileana Roup, MD, 175 mcg at 02/09/22 0838   saline (AYR) nasal gel with aloe 1 Application, 1 Application, Each Nare, Q4H, Ghimire, Henreitta Leber, MD, 1 Application at 59/56/38 1247   sodium chloride (OCEAN) 0.65 % nasal spray 1 spray, 1 spray, Each Nare, PRN, Ileana Roup, MD   sodium chloride (OCEAN) 0.65 % nasal spray 1 spray, 1 spray, Each Nare, PRN, Ileana Roup, MD   sodium chloride flush (NS) 0.9 % injection 10-40 mL, 10-40 mL, Intracatheter, PRN, Ileana Roup, MD, 10 mL at 02/05/22 2214   TPN ADULT (ION), , Intravenous, Continuous TPN, Franky Macho, RPH, Last Rate: 65 mL/hr at 02/08/22 2006, Infusion Verify at 75/64/33 2951   TPN CYCLIC-ADULT (ION), , Intravenous, Cyclic-See Admin Instructions, Donnamae Jude, RPH   vancomycin (VANCOREADY) IVPB 750 mg/150 mL, 750 mg, Intravenous, Q24H, Ghimire, Henreitta Leber, MD   white petrolatum (VASELINE) gel, , Topical, PRN, Ileana Roup, MD, Given at 01/08/2022 1705    Family History  Problem Relation Age of Onset   Hypertension Other    COPD Father    Breast cancer Other        dx in her 31s-30s    Social History   Socioeconomic History   Marital status: Married    Spouse name: Not on file   Number of children: Not on file   Years of education: Not on file   Highest education level: Not on file  Occupational History   Not on file  Tobacco Use   Smoking status: Never   Smokeless tobacco: Never  Vaping Use   Vaping Use: Never used  Substance and Sexual Activity   Alcohol use: Yes    Alcohol/week: 1.0 standard drink of alcohol    Types: 1 Glasses of wine per week    Comment: occ   Drug use: No   Sexual activity: Not on file  Other Topics Concern   Not on file  Social History Narrative   ** Merged History Encounter **       Social Determinants of Health   Financial Resource Strain: Not on file  Food Insecurity: Not on file  Transportation  Needs: Not on file  Physical Activity: Not on file  Stress: Not on file  Social Connections: Not on file    Review of Systems: A 12 point ROS discussed and pertinent positives are indicated in the HPI above.  All other systems are negative.  Review of Systems  Vital Signs: BP (!) 90/47  Pulse (!) 112   Temp 97.8 F (36.6 C) (Oral)   Resp (!) 30   Ht 5' (1.524 m)   Wt 160 lb 15 oz (73 kg)   SpO2 100%   BMI 31.43 kg/m   Physical Exam Constitutional:      Appearance: She is ill-appearing. She is not toxic-appearing.  HENT:     Mouth/Throat:     Mouth: Mucous membranes are moist.     Pharynx: Oropharynx is clear.  Cardiovascular:     Rate and Rhythm: Normal rate and regular rhythm.     Heart sounds: Normal heart sounds.  Pulmonary:     Effort: Pulmonary effort is normal. No respiratory distress.     Breath sounds: Normal breath sounds.  Neurological:     Mental Status: She is disoriented.     Imaging: CT ABDOMEN PELVIS WO CONTRAST  Result Date: 02/09/2022 CLINICAL DATA:  Sepsis. EXAM: CT ABDOMEN AND PELVIS WITHOUT CONTRAST TECHNIQUE: Multidetector CT imaging of the abdomen and pelvis was performed following the standard protocol without IV contrast. RADIATION DOSE REDUCTION: This exam was performed according to the departmental dose-optimization program which includes automated exposure control, adjustment of the mA and/or kV according to patient size and/or use of iterative reconstruction technique. COMPARISON:  CT abdomen pelvis dated 01/24/2022. FINDINGS: Evaluation of this exam is limited in the absence of intravenous contrast. Lower chest: Trace left and partially visualized small right pleural effusions. There is consolidative changes of the visualized right lung base. Mild cardiomegaly. The tip of a central venous line noted in the right atrium. No intra-abdominal free air. Diffuse mesenteric stranding and edema. Hepatobiliary: Cirrhosis. No biliary dilatation. Small  gallstone. No evidence of acute cholecystitis by CT. Pancreas: Unremarkable. No pancreatic ductal dilatation or surrounding inflammatory changes. Spleen: Normal in size without focal abnormality. Adrenals/Urinary Tract: The adrenal glands unremarkable. There is no hydronephrosis on either side. There is a 3 mm nonobstructing right renal interpolar calculus or cortical calcification. The visualized ureters appear unremarkable. The urinary bladder is decompressed around a Foley catheter. Stomach/Bowel: There is postsurgical changes of the bowel with a right lower quadrant ostomy. There is sigmoid diverticulosis without active inflammatory changes. No evidence of bowel obstruction. Vascular/Lymphatic: Moderate aortoiliac atherosclerotic disease. The IVC is unremarkable. No portal venous gas. There is no adenopathy. Reproductive: The uterus is anteverted.  No adnexal masses. Other: There is a loculated fluid collection in the anterior abdomen measuring approximately 14 x 6 cm in greatest axial dimensions and 13 cm in craniocaudal length which may represent a seroma or developing abscess. Musculoskeletal: There is diffuse subcutaneous edema. Midline vertical anterior pelvic wall open surgical incision. Osteopenia with degenerative changes of the spine. No acute osseous pathology. IMPRESSION: 1. Postsurgical changes of the bowel with a right lower quadrant ostomy. No evidence of bowel obstruction. 2. A loculated fluid collection in the anterior abdomen may represent a seroma or developing abscess. 3. Cirrhosis. 4. Cholelithiasis. 5. Sigmoid diverticulosis. 6. Trace left and partially visualized small right pleural effusions with consolidative changes of the visualized right lung base. 7. Aortic Atherosclerosis (ICD10-I70.0). Electronically Signed   By: Anner Crete M.D.   On: 02/09/2022 00:27   CT CHEST WO CONTRAST  Result Date: 02/08/2022 CLINICAL DATA:  Chronic dyspnea EXAM: CT CHEST WITHOUT CONTRAST TECHNIQUE:  Multidetector CT imaging of the chest was performed following the standard protocol without IV contrast. RADIATION DOSE REDUCTION: This exam was performed according to the departmental dose-optimization program which includes automated exposure control, adjustment of  the mA and/or kV according to patient size and/or use of iterative reconstruction technique. COMPARISON:  Previous CT done on 01/03/2022 and chest radiograph done today FINDINGS: Cardiovascular: Heart is enlarged in size. There are scattered calcifications in thoracic aorta. Coronary artery calcifications are seen. Main pulmonary artery measures 3.3 cm suggesting possible pulmonary arterial hypertension. There is ectasia of ascending thoracic aorta measuring 3.6 cm. Mediastinum/Nodes: No significant lymphadenopathy seen in mediastinum. There is subcutaneous stranding in left anterior chest wall close to the axilla PICC line is noted in left upper extremity with its tip at the junction of superior vena cava and right atrium. Lungs/Pleura: There is linear patchy infiltrate in right upper lobe. There are patchy infiltrates in right middle lobe with atrial improvement. There is moderate size right pleural effusion. There is interval improvement in aeration in right lower lobe. There is residual partial atelectasis in inferior aspect of right lower lobe. Small patchy infiltrates are seen in left lower lobe and lingula. There is minimal left pleural effusion. There is no pneumothorax. Upper Abdomen: Unremarkable. Musculoskeletal: No acute findings are seen. Reconstruction prostheses are seen in both breasts. IMPRESSION: There is interval decrease in amount of right pleural effusion with moderate residual right pleural effusion. There is interval improvement in aeration in right middle lobe and right lower lobe suggesting decrease in atelectasis. Still, fairly extensive infiltrates are seen in right upper lobe, right middle lobe and right lower lobe suggesting  atelectasis/pneumonia. Follow-up studies until complete clearing occurs should be considered to rule out any underlying neoplastic process. There are small patchy infiltrates in the lingula and left lower lobe suggesting subsegmental atelectasis/pneumonia. Minimal left pleural effusion is seen. Cardiomegaly. Coronary artery calcifications are seen. There is ectasia of main pulmonary artery suggesting pulmonary arterial hypertension. There is subcutaneous stranding in the anterior chest wall close to the left axilla suggesting contusion or cellulitis. Electronically Signed   By: Elmer Picker M.D.   On: 02/08/2022 15:41   DG Chest Port 1V same Day  Result Date: 02/08/2022 CLINICAL DATA:  Shortness of breath. EXAM: PORTABLE CHEST 1 VIEW COMPARISON:  02/06/2022 FINDINGS: Left arm PICC line remains in appropriate position. Heart size is stable. Infiltrate or atelectasis in the right lung base shows no significant change. Layering right pleural effusion is also stable. Atelectasis or consolidation in the medial left lung base is also unchanged. IMPRESSION: No significant change in right greater than left basilar atelectasis versus infiltrates, and layering right pleural effusion. Electronically Signed   By: Marlaine Hind M.D.   On: 02/08/2022 09:43   DG CHEST PORT 1 VIEW  Result Date: 02/06/2022 CLINICAL DATA:  Shortness of breath EXAM: PORTABLE CHEST 1 VIEW COMPARISON:  Previous studies including the examination of 02/03/2022 FINDINGS: There is increased density in right mid and right lower lung fields with interval worsening. There is blunting of left lateral CP angle. There are no signs of alveolar pulmonary edema in the visualized lung fields. Evaluation of the right mid and both lower lung fields for infiltrates is limited by effusions. Tip of left PICC line is seen at the junction of superior vena cava and right atrium. There is no pneumothorax. IMPRESSION: Bilateral pleural effusions, more so on the  right side. There is interval increase in amount of right pleural effusion. Possibility of atelectasis/pneumonia in right mid and both lower lung fields is not excluded. There are no new infiltrates in the visualized lung fields. Electronically Signed   By: Elmer Picker M.D.   On: 02/06/2022  14:56   DG Chest Port 1 View  Result Date: 02/03/2022 CLINICAL DATA:  Shortness of breath, pleural effusion. EXAM: PORTABLE CHEST 1 VIEW COMPARISON:  Chest radiograph February 01, 2022. FINDINGS: Left upper extremity PICC with tip overlying the right atrium. EKG leads coiled over the chest and upper abdomen. Cardiac silhouette is partially obscured but appears enlarged, unchanged. Increased patchy airspace disease in the right lung base with similar airspace disease in the left lung base. Small right-greater-than-left pleural effusions are similar prior. Right upper quadrant and right axillary surgical clips. No acute osseous abnormality. IMPRESSION: Increased patchy airspace disease in the right lung with similar airspace disease in the left lung base. No significant interval change in the small right-greater-than-left pleural effusions. Electronically Signed   By: Dahlia Bailiff M.D.   On: 02/03/2022 08:43   DG CHEST PORT 1 VIEW  Result Date: 02/01/2022 CLINICAL DATA:  Wheezing. EXAM: PORTABLE CHEST 1 VIEW COMPARISON:  02/01/2022. FINDINGS: The heart size and mediastinal contours are stable. There is elevation of the right diaphragm with stable patchy airspace disease in the right lung and at the left lung base. Small pleural effusions are noted bilaterally. No pneumothorax. A left-sided PICC line is stable in position. Surgical clips are present in the right upper quadrant. No acute osseous abnormality. IMPRESSION: 1. Elevation of the right diaphragm with slightly increased patchy airspace disease in the mid to lower right lung and left lung base. 2. Small bilateral pleural effusions. Electronically Signed   By:  Brett Fairy M.D.   On: 02/01/2022 21:14   DG CHEST PORT 1 VIEW  Result Date: 02/01/2022 CLINICAL DATA:  Post thoracentesis EXAM: PORTABLE CHEST 1 VIEW COMPARISON:  Earlier same day FINDINGS: Decreased right pleural effusion. No pneumothorax. Persistent probable patchy right lung atelectasis. Similar left pleural effusion and adjacent atelectasis. Stable cardiomediastinal contours. IMPRESSION: Decreased right pleural effusion and no pneumothorax post thoracentesis. Electronically Signed   By: Macy Mis M.D.   On: 02/01/2022 09:24   DG CHEST PORT 1 VIEW  Result Date: 02/01/2022 CLINICAL DATA:  Respiratory distress. EXAM: PORTABLE CHEST 1 VIEW COMPARISON:  01/31/2022. FINDINGS: Left arm PICC line tip is in the cavoatrial junction. Stable cardiomediastinal contours. Moderate to large right pleural effusion and small left pleural effusion unchanged from previous exam. There is significantly diminished aeration to the right lower lung and right midlung which appears unchanged from the previous study. IMPRESSION: 1. No change in aeration to the right lower lung and right midlung compared with previous exam. 2. Stable bilateral pleural effusions, right greater than left. Electronically Signed   By: Kerby Moors M.D.   On: 02/01/2022 06:54   DG CHEST PORT 1 VIEW  Result Date: 01/31/2022 CLINICAL DATA:  Acute respiratory failure EXAM: PORTABLE CHEST 1 VIEW COMPARISON:  Chest x-ray dated January 31, 2022 FINDINGS: Partially visualized cardiac and mediastinal contours are unchanged. Bibasilar atelectasis and moderate right and small left pleural effusions, unchanged. No evidence of pneumothorax. IMPRESSION: Bibasilar atelectasis and moderate right and small left pleural effusions, unchanged. Electronically Signed   By: Yetta Glassman M.D.   On: 01/31/2022 16:05   DG Chest Port 1 View  Result Date: 01/31/2022 CLINICAL DATA:  Acute respiratory failure EXAM: PORTABLE CHEST 1 VIEW COMPARISON:  Prior chest  x-ray yesterday 01/30/2022 FINDINGS: The patient has been extubated, the right central line removed and the nasogastric tube removed. The left upper extremity PICC remains in stable position with the tip overlying the right atrium. Stable cardiac and mediastinal  contours. Inspiratory volumes are very low. Increased streaky airspace opacities in the bases bilaterally consistent with atelectasis. Probable moderate right pleural effusion and small left pleural effusion. IMPRESSION: 1. Interval extubation, removal of gastric tube and right IJ central line. 2. Lower inspiratory volumes with increased bibasilar atelectasis. 3. Moderate right and small left pleural effusions. Electronically Signed   By: Jacqulynn Cadet M.D.   On: 01/31/2022 08:14   DG CHEST PORT 1 VIEW  Result Date: 01/30/2022 CLINICAL DATA:  [id number] status post thoracocentesis EXAM: PORTABLE CHEST 1 VIEW COMPARISON:  January 30, 2022 FINDINGS: The cardiomediastinal silhouette is unchanged in contour.ETT tip terminates 18 mm above the carina. Enteric tube tip and side port project over the proximal stomach. RIGHT neck CVC tip terminates over the expected region of superior cavoatrial junction. LEFT upper extremity PICC tip terminates over the expected region of the superior RIGHT atrium. Moderate RIGHT pleural effusion, decreased in comparison to prior. Small LEFT pleural effusion. No significant pneumothorax. Bilateral basilar predominant airspace opacities with mildly improved aeration of the RIGHT mid lung in comparison to prior. Visualized abdomen is unremarkable. IMPRESSION: Moderate RIGHT pleural effusion, decreased in comparison to prior. No significant pneumothorax is noted. Electronically Signed   By: Valentino Saxon M.D.   On: 01/30/2022 10:18   DG Chest Port 1 View  Result Date: 01/30/2022 CLINICAL DATA:  Pleural effusion follow-up EXAM: PORTABLE CHEST 1 VIEW COMPARISON:  January 27, 2022 FINDINGS: The ETT is in good position. The NG  tube terminates below today's film. The left PICC line and right central line are stable. No pneumothorax. The right-sided pleural effusion with underlying opacity is larger in the interval. A smaller left effusion with underlying opacity is stable. No other interval changes. IMPRESSION: 1. Stable support apparatus as above. 2. The right pleural effusion is moderate to large and larger in the interval. A small effusion is stable. Opacities underlying the bilateral effusions. Electronically Signed   By: Dorise Bullion III M.D.   On: 01/30/2022 08:24   DG CHEST PORT 1 VIEW  Result Date: 01/28/2022 CLINICAL DATA:  Endotracheal tube evaluation. EXAM: PORTABLE CHEST 1 VIEW COMPARISON:  Radiograph yesterday. FINDINGS: Endotracheal tube tip is 3.3 cm from the carina. Enteric tube in place, side-port below the diaphragm. There is a new right internal jugular central venous catheter tip overlying the mid SVC. Stable left upper extremity PICC. Improving right pleural effusion and basilar opacity. Stable retrocardiac opacity and small effusion. No pneumothorax. Stable heart size. IMPRESSION: 1. New right internal jugular central venous catheter with tip overlying the mid SVC. Endotracheal and enteric tubes in place. 2. Improving right pleural effusion and basilar opacity. Stable retrocardiac opacity and small effusion. Electronically Signed   By: Keith Rake M.D.   On: 01/28/2022 00:03   CT GUIDED VISCERAL FLUID DRAIN BY PERC CATH  Result Date: 01/26/2022 INDICATION: Right lower quadrant abscess by CT EXAM: CT DRAINAGE ANTERIOR RIGHT LOWER QUADRANT ABSCESS Date:  01/26/2022 01/26/2022 3:25 pm Radiologist:  Jerilynn Mages. Daryll Brod, MD Guidance:  CT FLUOROSCOPY: None. MEDICATIONS: 1% lidocaine local ANESTHESIA/SEDATION: Moderate (conscious) sedation was employed during this procedure. A total of Versed 1.0 mg and Fentanyl 25 mcg was administered intravenously. Moderate Sedation Time: 17 minutes. The patient's level of  consciousness and vital signs were monitored continuously by radiology nursing throughout the procedure under my direct supervision. CONTRAST:  None. COMPLICATIONS: None immediate. PROCEDURE: Informed consent was obtained from the patient following explanation of the procedure, risks, benefits and alternatives. The patient understands,  agrees and consents for the procedure. All questions were addressed. A time out was performed. Maximal barrier sterile technique utilized including caps, mask, sterile gowns, sterile gloves, large sterile drape, hand hygiene, and Betadine. Previous imaging reviewed. Patient positioned supine. Noncontrast localization CT performed. The anterior right lower quadrant air-fluid collection below the bowel anastomosis was localized and compared to the prior study. Air-fluid collection now contains oral contrast when compared to 01/24/2022 compatible with anastomotic leak. Under sterile conditions and local anesthesia, an 18 gauge 10 cm access needle was advanced from an anterolateral approach into the air-fluid collection. Needle position confirmed with CT. Syringe aspiration yielded fecal contaminated fluid. Guidewire inserted followed by tract dilatation to insert a 10 [place] drain. Drain catheter position confirmed with CT. Abscess decompressed by syringe aspiration. 30 cc fecal contaminated fluid aspirated. G stain and culture sent. Catheter secured with a Prolene suture and connected to external suction bulb. Sterile dressing applied. No immediate complication. Patient tolerated the procedure well. IMPRESSION: Successful CT-guided anterior right lower quadrant abscess drain placement. Electronically Signed   By: Jerilynn Mages.  Shick M.D.   On: 01/26/2022 15:46   DG CHEST PORT 1 VIEW  Result Date: 01/26/2022 CLINICAL DATA:  Endotracheal tube.  Respiratory distress. EXAM: PORTABLE CHEST 1 VIEW COMPARISON:  01/22/2022 FINDINGS: Endotracheal tube is in place, tip approximately 1.2 centimeters  above the carina, directed towards the RIGHT mainstem bronchus. Consider withdrawing endotracheal tube 1.5 centimeters. Nasogastric tube side port overlies the level of proximal stomach. LEFT-sided PICC line tip overlies the superior vena cava. Heart margins are obscured. There is dense opacity throughout the LOWER portion of the RIGHT lung, significantly increased compared with prior study. There is residual aerated lung at the RIGHT lung apex. There is subsegmental atelectasis at the MEDIAL LEFT lung base partially obscuring the hemidiaphragm. IMPRESSION: Significant increase in RIGHT pulmonary opacity. Endotracheal tube is directed towards the RIGHT mainstem bronchus, just above the carina. Consider withdrawing endotracheal tube 5 centimeters and reassessing position. These results will be called to the ordering clinician or representative by the Radiologist Assistant, and communication documented in the PACS or [employer]. Electronically Signed   By: Nolon Nations M.D.   On: 01/26/2022 08:25   DG Chest Port 1 View  Result Date: 01/20/2022 CLINICAL DATA:  Intubation EXAM: PORTABLE CHEST 1 VIEW COMPARISON:  Portable exam 1526 hours compared to 01/22/2022 FINDINGS: Tip of endotracheal tube projects 1.9 cm above carina. Nasogastric tube extends into stomach. LEFT arm PICC line tip projects over RIGHT atrium; recommend withdrawal 4 cm. Normal heart size, mediastinal contours, and pulmonary vascularity. Persistent RIGHT basilar opacity consistent with effusion and atelectasis versus infiltrate. LEFT basilar atelectasis, slightly increased. No pneumothorax. IMPRESSION: Recommend withdrawal of RIGHT arm PICC line 4 cm. Persistent RIGHT pleural effusion and basilar atelectasis versus infiltrate with slightly increased LEFT basilar atelectasis. Electronically Signed   By: Lavonia Dana M.D.   On: 01/07/2022 15:35   CT ABDOMEN PELVIS W CONTRAST  Result Date: 01/24/2022 CLINICAL DATA:  Abdominal pain, acute,  nonlocalized EXAM: CT ABDOMEN AND PELVIS WITH CONTRAST TECHNIQUE: Multidetector CT imaging of the abdomen and pelvis was performed using the standard protocol following bolus administration of intravenous contrast. RADIATION DOSE REDUCTION: This exam was performed according to the departmental dose-optimization program which includes automated exposure control, adjustment of the mA and/or kV according to patient size and/or use of iterative reconstruction technique. CONTRAST:  125m OMNIPAQUE IOHEXOL 300 MG/ML  SOLN COMPARISON:  01/10/2022 FINDINGS: Lower chest: Focal consolidation within the basilar  right middle lobe is again seen, not fully assessed on this examination. Moderate right pleural effusion appears slightly enlarged since prior examination with compressive atelectasis of the right lower lobe. Cardiac size is mildly enlarged. Central venous catheter tip noted within the right atrium. Bilateral breast implants are noted. Surgical changes of right mastectomy are suspected. Hepatobiliary: No focal liver abnormality is seen. No gallstones, gallbladder wall thickening, or biliary dilatation. Pancreas: Unremarkable Spleen: Unremarkable Adrenals/Urinary Tract: The adrenal glands are unremarkable. The kidneys are normal. The bladder is largely decompressed. Stomach/Bowel: Mild descending and sigmoid colonic diverticulosis without superimposed acute inflammatory change. Surgical changes of right hemicolectomy are identified. A extraluminal collection of gas and fluid is seen within the right mid abdomen anteriorly measuring 2.9 x 5.9 x 8.1 cm in greatest dimension, possibly representing an anastomotic leak given its close association with the anastomotic staple line, postoperative fluid collection, or abscess. There is extensive infiltrative change within the mesentery of the right mid abdomen, possibly postsurgical in nature or inflammatory given the adjacent fluid collection. Mild ascites is present. Stomach is  unremarkable. Mild bowel wall thickening involving several loops of distal small bowel within the pelvis, again possibly postsurgical in nature. No evidence of obstruction. No free intraperitoneal gas. Vascular/Lymphatic: Aortic atherosclerosis. No enlarged abdominal or pelvic lymph nodes. Reproductive: Uterus and bilateral adnexa are unremarkable. Other: Mild diffuse subcutaneous body wall edema in keeping with anasarca. Superficial dehiscence of the midline laparotomy wound. The abdominal fascia appears intact. Musculoskeletal: Degenerative changes seen within the lumbar spine. No lytic or blastic bone lesion. IMPRESSION: 1. Surgical changes of right hemicolectomy. Extraluminal collection of gas and fluid within the right mid abdomen anteriorly measuring 2.9 x 5.9 x 8.1 cm in greatest dimension, possibly representing an anastomotic leak given its close association with the anastomotic staple line, postoperative fluid collection, or abscess. 2. Extensive infiltrative change within the mesentery of the right mid abdomen, possibly postsurgical in nature or inflammatory given the adjacent fluid collection. 3. Mild bowel wall thickening involving several loops of distal small bowel within the pelvis, again possibly postsurgical in nature. No evidence of obstruction. 4. Slight interval enlargement of right pleural effusion, progressive body wall subcutaneous edema, and development of mild ascites in keeping with progressive anasarca. 5. Focal consolidation within the basilar right middle lobe again noted, not fully assessed on this examination. Aortic Atherosclerosis (ICD10-I70.0). Electronically Signed   By: Fidela Salisbury M.D.   On: 01/24/2022 01:33   DG Chest Port 1 View  Result Date: 01/22/2022 CLINICAL DATA:  Fever.  Respiratory distress. EXAM: PORTABLE CHEST 1 VIEW COMPARISON:  Radiograph 01/19/2022.  CT 01/03/2022 FINDINGS: Left upper extremity PICC tip unchanged. Moderate-sized right pleural effusion is  minimally improved from prior exam. There may be slight improvement in right perihilar airspace disease. Minor atelectasis at the left lung base. No other left lung consolidation. No pneumothorax. Stable heart size and mediastinal contours. IMPRESSION: 1. Moderate-sized right pleural effusion is minimally improved from prior exam. 2. Slight improvement in right perihilar airspace disease. 3. Minor left basilar atelectasis. Electronically Signed   By: Keith Rake M.D.   On: 01/22/2022 18:15   VAS [place] UPPER EXTREMITY VENOUS DUPLEX  Result Date: 01/21/2022 UPPER VENOUS STUDY  Patient Name:  [name]  Date of Exam:   01/21/2022 Medical Rec #: [id number]        Accession #:    [id number] Date of Birth: November 06, 1940        Patient Gender: F Patient Age:  80 years Exam Location:  [facility] Procedure:      VAS [place] UPPER EXTREMITY VENOUS DUPLEX Referring Phys: [facility] E[place] --------------------------------------------------------------------------------  Indications: Left arm swelling and erythema, PICC Comparison Study: No prior studies. Performing Technologist: Darlin Coco RDMS, RVT  Examination Guidelines: A complete evaluation includes B-mode imaging, spectral Doppler, color Doppler, and power Doppler as needed of all accessible portions of each vessel. Bilateral testing is considered an integral part of a complete examination. Limited examinations for reoccurring indications may be performed as noted.  Right Findings: +----------+------------+---------+-----------+----------+-------+ RIGHT     CompressiblePhasicitySpontaneousPropertiesSummary +----------+------------+---------+-----------+----------+-------+ Subclavian               Yes       Yes                      +----------+------------+---------+-----------+----------+-------+  Left Findings: +----------+------------+---------+-----------+----------+---------------+ LEFT      CompressiblePhasicitySpontaneousProperties     Summary     +----------+------------+---------+-----------+----------+---------------+ IJV           Full       Yes       Yes                              +----------+------------+---------+-----------+----------+---------------+ Subclavian    Full       Yes       Yes                              +----------+------------+---------+-----------+----------+---------------+ Axillary      Full       Yes       Yes                              +----------+------------+---------+-----------+----------+---------------+ Brachial      Full                                                  +----------+------------+---------+-----------+----------+---------------+ Radial        Full                                                  +----------+------------+---------+-----------+----------+---------------+ Ulnar         Full                                                  +----------+------------+---------+-----------+----------+---------------+ Cephalic      Full                                                  +----------+------------+---------+-----------+----------+---------------+ Basilic       Full                                  PICC visualized +----------+------------+---------+-----------+----------+---------------+  Summary:  Right: No evidence of thrombosis in the subclavian.  Left: No evidence of deep vein thrombosis in the upper extremity. No evidence of superficial vein thrombosis in the upper extremity.  *See table(s) above for measurements and observations.  Diagnosing physician: Monica Martinez MD Electronically signed by Monica Martinez MD on 01/21/2022 at 3:19:11 PM.    Final    DG CHEST PORT 1 VIEW  Result Date: 01/19/2022 CLINICAL DATA:  Dyspnea EXAM: PORTABLE CHEST 1 VIEW COMPARISON:  01/30/2022 FINDINGS: No significant change in AP portable examination. Large right pleural effusion and associated atelectasis or consolidation. The left lung is  normally aerated. Heart and mediastinum unremarkable. Left upper extremity PICC. IMPRESSION: No significant change in AP portable examination. Large right pleural effusion and associated atelectasis or consolidation. The left lung is normally aerated. No new airspace opacity. Electronically Signed   By: Delanna Ahmadi M.D.   On: 01/19/2022 08:47   DG Chest Port 1 View  Result Date: 01/17/2022 CLINICAL DATA:  Acute respiratory distress. EXAM: PORTABLE CHEST 1 VIEW COMPARISON:  January 18, 2022 (10:04 a.m.) FINDINGS: There is stable left-sided PICC line positioning. The cardiac silhouette is enlarged and unchanged in size. There is stable opacification of the mid and lower right lung. The left lung is clear. No pneumothorax is identified. The visualized skeletal structures are unremarkable. IMPRESSION: Stable opacification of mid and lower right lung, likely representing a combination of pleural effusion and atelectasis/infiltrate. Electronically Signed   By: Virgina Norfolk M.D.   On: 01/07/2022 19:31   ECHOCARDIOGRAM LIMITED  Result Date: 01/10/2022    ECHOCARDIOGRAM LIMITED REPORT   Patient Name:   [name] Date of Exam: 01/05/2022 Medical Rec #:  [id number]       Height:       60.0 in Accession #:    [id number]      Weight:       166.0 lb Date of Birth:  1940/06/20       BSA:          1.725 m Patient Age:    59 years        BP:           161/70 mmHg Patient Gender: F               HR:           78 bpm. Exam Location:  Inpatient Procedure: Limited Echo, Intracardiac Opacification Agent, Limited Color Doppler            and Cardiac Doppler Indications:    Congestive Heart Failure I50.9  History:        Patient has prior history of Echocardiogram examinations, most                 recent 01/04/2022. Arrythmias:Atrial Fibrillation; Risk                 Factors:GERD and Hypertension.  Sonographer:    Bernadene Person RDCS Referring Phys: [place]  1. Left ventricular ejection fraction,  by estimation, is 60 to 65%. Left ventricular ejection fraction by 2D MOD biplane is 60.6 %. The left ventricle has normal function. The left ventricle has no regional wall motion abnormalities. There is mild left ventricular hypertrophy. Left ventricular diastolic function could not be evaluated.  2. Right ventricular systolic function is moderately reduced. The right ventricular size is normal. There is severely elevated pulmonary artery systolic pressure. The estimated right ventricular systolic pressure is 67.8 mmHg.  3. The mitral valve is grossly normal. Trivial mitral valve regurgitation.  4. The aortic valve is tricuspid. Aortic valve regurgitation is mild to moderate. Aortic valve sclerosis is present, with no evidence of aortic valve stenosis. Aortic regurgitation PHT measures 227 msec.  5. The inferior vena cava is normal in size with <50% respiratory variability, suggesting right atrial pressure of 8 mmHg. Comparison(s): Changes from prior study are noted. 01/04/2022: LVEF 25-30%, Takatsubo-type wall motion abnormalities. FINDINGS  Left Ventricle: Left ventricular ejection fraction, by estimation, is 60 to 65%. Left ventricular ejection fraction by 2D MOD biplane is 60.6 %. The left ventricle has normal function. The left ventricle has no regional wall motion abnormalities. The left ventricular internal cavity size was normal in size. There is mild left ventricular hypertrophy. Left ventricular diastolic function could not be evaluated. Left ventricular diastolic function could not be evaluated due to atrial fibrillation. Right Ventricle: The right ventricular size is normal. No increase in right ventricular wall thickness. Right ventricular systolic function is moderately reduced. There is severely elevated pulmonary artery systolic pressure. The tricuspid regurgitant velocity is 4.17 m/s, and with an assumed right atrial pressure of 8 mmHg, the estimated right ventricular systolic pressure is 93.2 mmHg.  Mitral Valve: The mitral valve is grossly normal. Trivial mitral valve regurgitation. Tricuspid Valve: The tricuspid valve is grossly normal. Tricuspid valve regurgitation is mild. Aortic Valve: The aortic valve is tricuspid. Aortic valve regurgitation is mild to moderate. Aortic regurgitation PHT measures 227 msec. Aortic valve sclerosis is present, with no evidence of aortic valve stenosis. Pulmonic Valve: The pulmonic valve was grossly normal. Pulmonic valve regurgitation is trivial. Aorta: The aortic root and ascending aorta are structurally normal, with no evidence of dilitation. Venous: The inferior vena cava is normal in size with less than 50% respiratory variability, suggesting right atrial pressure of 8 mmHg. LEFT VENTRICLE PLAX 2D                        Biplane EF (MOD) LVIDd:         4.50 cm         LV Biplane EF:   Left LVIDs:         2.80 cm                          ventricular LV PW:         1.10 cm                          ejection LV IVS:        1.10 cm                          fraction by LVOT diam:     2.00 cm                          2D MOD LV SV:         62                               biplane is LV SV Index:   36  60.6 %. LVOT Area:     3.14 cm  LV Volumes (MOD) LV vol d, MOD    53.2 ml A2C: LV vol d, MOD    41.1 ml A4C: LV vol s, MOD    19.5 ml A2C: LV vol s, MOD    17.1 ml A4C: LV SV MOD A2C:   33.7 ml LV SV MOD A4C:   41.1 ml LV SV MOD BP:    30.0 ml RIGHT VENTRICLE TAPSE (M-mode): 1.2 cm LEFT ATRIUM         Index LA diam:    3.90 cm 2.26 cm/m  AORTIC VALVE LVOT Vmax:   110.00 cm/s LVOT Vmean:  72.700 cm/s LVOT VTI:    0.196 m AI PHT:      227 msec  AORTA Ao Root diam: 2.90 cm TRICUSPID VALVE TR Peak grad:   69.6 mmHg TR Vmax:        417.00 cm/s  SHUNTS Systemic VTI:  0.20 m Systemic Diam: 2.00 cm Lyman Bishop MD Electronically signed by Lyman Bishop MD Signature Date/Time: 01/08/2022/4:21:18 PM    Final    DG CHEST PORT 1 VIEW  Result Date:  01/31/2022 CLINICAL DATA:  Respiratory distress, shortness of breath EXAM: PORTABLE CHEST 1 VIEW COMPARISON:  01/17/2022 FINDINGS: Persistent right pleural effusion with adjacent atelectasis/consolidation. Change in appearance may be due to differences in positioning. Persistent probable mild atelectasis at the left lung base. Similar partially obscured cardiomediastinal contours. Left subclavian line is unchanged. IMPRESSION: Persistent right pleural effusion and adjacent atelectasis/consolidation. Electronically Signed   By: Macy Mis M.D.   On: 01/24/2022 10:22   DG CHEST PORT 1 VIEW  Result Date: 01/17/2022 CLINICAL DATA:  Shortness of breath. EXAM: PORTABLE CHEST 1 VIEW COMPARISON:  One-view chest x-ray 01/12/2022 FINDINGS: Patient has been extubated. The heart is enlarged. Right pleural effusion and right-sided airspace disease has increased. Minimal atelectasis at the left base is stable. Surgical clips are again noted in the right breast or axilla. IMPRESSION: 1. Increasing right pleural effusion and right-sided airspace disease. While this likely reflects atelectasis, infection is not excluded. 2. Stable cardiomegaly without failure. Electronically Signed   By: San Morelle M.D.   On: 01/17/2022 11:39   DG Chest Port 1 View  Result Date: 01/24/2022 CLINICAL DATA:  Endotracheal tube and site 2.  Respiratory distress. EXAM: PORTABLE CHEST 1 VIEW COMPARISON:  One-view chest x-ray 01/12/2022 FINDINGS: Endotracheal tube terminates 1.5 cm above the carina and could be pulled back 1-2 cm for more optimal positioning. A Swan-Ganz catheter terminates at the pulmonary outflow tract. Enteric tube terminates in the stomach. A left-sided PICC line is in place. The tip is in the right atrium. Right greater left pleural effusion remains. Bibasilar airspace disease is noted. IMPRESSION: 1. Endotracheal tube terminates 1.5 cm above the carina and could be pulled back 1-2 cm for more optimal positioning.  2. Swan-Ganz catheter terminates at the pulmonary outflow tract. 3. New left-sided PICC line terminates in the right atrium. 4. Enteric tube terminates in the stomach. 5. Right greater than left pleural effusions and bibasilar airspace opacities. This likely reflects atelectasis. Infection is not excluded. Electronically Signed   By: San Morelle M.D.   On: 01/24/2022 08:33   DG Chest Port 1 View  Result Date: 01/12/2022 CLINICAL DATA:  PICC line placement.  Endotracheal tube placement. EXAM: PORTABLE CHEST 1 VIEW COMPARISON:  Same day at 5:14 a.m. FINDINGS: Endotracheal tube has tip 2.6 cm above the carina. Right IJ  Swan-Ganz catheter is present with tip over the main pulmonary artery segment. Interval placement of left-sided PICC line with tip at the cavoatrial junction. Nasogastric tube looped once in the stomach with tip in the right upper quadrant unchanged. Lungs are adequately inflated with moderate opacification over the right mid to lower lung likely effusion with associated atelectasis. Chest of a small amount left pleural fluid with basilar atelectasis. Infection over the lung bases is possible. Cardiomediastinal silhouette and remainder of the exam is unchanged. IMPRESSION: 1. Persistent opacification over the right mid to lower lung likely effusion with associated atelectasis. Small amount left pleural fluid with basilar atelectasis. Infection over the lung bases is possible. 2. Tubes and lines as described. Left PICC line with tip at the cavoatrial junction. Electronically Signed   By: Marin Olp M.D.   On: 01/12/2022 12:53   [place] EKG SITE RITE  Result Date: 01/12/2022 If Site Rite image not attached, placement could not be confirmed due to current cardiac rhythm.  DG CHEST PORT 1 VIEW  Result Date: 01/12/2022 CLINICAL DATA:  Endotracheal tube placement, recent abdominal surgery, respiratory distress EXAM: PORTABLE CHEST 1 VIEW COMPARISON:  01/24/2022 FINDINGS: Endotracheal tube is  1.5 cm above the carina. Right IJ Swan-Ganz catheter tip overlies the main pulmonary artery. Enteric tube passes into the stomach. Persistent elevation of the right hemidiaphragm. Probable right larger than left pleural effusions with bibasilar atelectasis/consolidation. No pneumothorax. Similar cardiomediastinal contours. IMPRESSION: Lines and tubes as above. Probable right larger than left pleural effusions with bibasilar atelectasis/consolidation. Electronically Signed   By: Macy Mis M.D.   On: 01/12/2022 08:05   DG CHEST PORT 1 VIEW  Result Date: 01/22/2022 CLINICAL DATA:  Respiratory failure.  Endotracheal tube placement. EXAM: PORTABLE CHEST 1 VIEW COMPARISON:  January 11, 2022. FINDINGS: Endotracheal tube is noted in grossly good position. Nasogastric tube is seen with tip in proximal stomach. Right internal jugular Swan-Ganz catheter is now noted with tip in expected position of main pulmonary artery. Stable right basilar atelectasis or infiltrate is noted with associated effusion. Small left pleural effusion is noted. Bony thorax is unremarkable. IMPRESSION: Endotracheal and nasogastric tubes are in grossly good position. Right internal jugular Swan-Ganz catheter is noted with tip in expected position of main pulmonary artery. Electronically Signed   By: Marijo Conception M.D.   On: 01/17/2022 13:10   DG Abd Portable 1V  Result Date: 01/17/2022 CLINICAL DATA:  Ileus. EXAM: PORTABLE ABDOMEN - 1 VIEW COMPARISON:  01/10/2022 FINDINGS: NG tube is been removed in the interval. Similar appearance of diffuse gaseous small bowel and colonic distension. Contrast in the bladder is compatible with recent CT scan. IMPRESSION: Similar appearance of diffuse gaseous small bowel and colonic distension. Interval removal of NG tube. Electronically Signed   By: Misty Stanley M.D.   On: 01/10/2022 07:36   DG CHEST PORT 1 VIEW  Result Date: 02/01/2022 CLINICAL DATA:  NG tube placement. EXAM: PORTABLE CHEST 1 VIEW  COMPARISON:  Earlier same day FINDINGS: 0618 hours. Stable asymmetric elevation right hemidiaphragm with right base collapse/consolidation and small right effusion. Minimal atelectasis noted left base with possible tiny left pleural effusion. The cardio pericardial silhouette is enlarged. NG tube tip is in the stomach with proximal side port below the GE junction. Telemetry leads overlie the chest. IMPRESSION: NG tube tip is in the stomach with proximal side port below the GE junction. Electronically Signed   By: Misty Stanley M.D.   On: 01/10/2022 06:33   DG  CHEST PORT 1 VIEW  Result Date: 02/01/2022 CLINICAL DATA:  NG tube placement. EXAM: PORTABLE CHEST 1 VIEW COMPARISON:  01/10/2022 FINDINGS: 0610 hours. NG tube tip is positioned at the level of the mid esophagus. Asymmetric elevation right hemidiaphragm noted with right base collapse/consolidation and small right pleural effusion. Minimal atelectasis noted left base with tiny left effusion. The cardio pericardial silhouette is enlarged. Bones are diffusely demineralized. Telemetry leads overlie the chest. IMPRESSION: 1. NG tube tip positioned at the level of the mid esophagus. 2. Asymmetric elevation right hemidiaphragm with right base collapse/consolidation and small right pleural effusion. Electronically Signed   By: Misty Stanley M.D.   On: 01/26/2022 06:33   DG Chest Port 1 View  Result Date: 01/10/2022 CLINICAL DATA:  Small bowel disease. Respiratory distress. EXAM: PORTABLE CHEST 1 VIEW COMPARISON:  January 06, 2022 FINDINGS: Enteric catheter curled on itself with tip collimated off the image at the level of the mid gastric body. Enlarged cardiac silhouette. Right pleural effusion and right lower lung field airspace consolidation. Mild atelectasis of the left lower lung. Osseous structures are without acute abnormality. Soft tissues are grossly normal. IMPRESSION: 1. Enteric catheter folded on itself with tip collimated off the image at the level of  the mid gastric body. 2. Right pleural effusion and right lower lung field airspace consolidation. Electronically Signed   By: Fidela Salisbury M.D.   On: 01/10/2022 16:33   DG Abd Portable 1V  Result Date: 01/10/2022 CLINICAL DATA:  NG tube placement. EXAM: PORTABLE ABDOMEN - 1 VIEW COMPARISON:  01/10/2022 FINDINGS: Enteric feeding tube has been removed. A nasal/orogastric tube passes well below the diaphragm, tip projecting in the distal stomach. Dilated loops of small bowel noted centrally, unchanged from the earlier study. IMPRESSION: Well-positioned nasal/orogastric tube. Electronically Signed   By: Lajean Manes M.D.   On: 01/10/2022 16:32    Labs:  CBC: Recent Labs    02/07/22 0515 02/08/22 0309 02/08/22 2151 02/09/22 1002  WBC 9.2 9.7 12.1* 11.3*  HGB 8.8* 8.4* 8.2* 8.4*  HCT 28.4* 26.8* 26.3* 27.5*  PLT 243 251 275 271    COAGS: Recent Labs    01/03/22 2119 01/05/22 0000 01/14/22 0708 01/15/22 0529 01/15/22 2349 02/09/22 0025  INR 3.8*  --   --   --   --  1.2  APTT 39*   < > 26 32 89* 39*   < > = values in this interval not displayed.    BMP: Recent Labs    02/07/22 0515 02/08/22 0524 02/09/22 0025 02/09/22 1002  NA 138 138 136 137  K 3.7 4.5 4.0 4.5  CL 93* 91* 91* 93*  CO2 36* 39* 38* 36*  GLUCOSE 135* 117* 127* 107*  BUN 26* 32* 37* 37*  CALCIUM 9.2 9.4 9.3 9.5  CREATININE 0.57 0.53 0.61 0.64  GFRNONAA >60 >60 >60 >60    LIVER FUNCTION TESTS: Recent Labs    02/01/22 0407 02/03/22 0549 02/08/22 0524 02/09/22 0025  BILITOT 0.8 0.4 0.6 0.8  AST '19 23 25 22  '$ ALT '19 14 12 11  '$ ALKPHOS 78 92 100 100  PROT 5.6* 5.9* 6.6 6.4*  ALBUMIN 2.0* 2.0* 1.9* 1.9*     Assessment and Plan: Abdominal fluid collection/abscess Imaging reviewed, amenable to drainage. Pt received therapeutic dose of Lovenox earlier today ~1100 Will plan for CT guided perc drain tomorrow. NPO p MN and will hold tomorrow lovenox. Discussed with husband. Risks and  benefits discussed with the patient's husband including  bleeding, infection, damage to adjacent structures, bowel perforation/fistula connection, and sepsis.  All questions were answered, patient is agreeable to proceed. Consent obtained.    Electronically Signed: Ascencion Dike, PA-C 02/09/2022, 2:45 PM   I spent a total of 20 in face to face in clinical consultation, greater than 50% of which was counseling/coordinating care for abdominal abscess drain

## 2022-02-10 ENCOUNTER — Inpatient Hospital Stay (HOSPITAL_COMMUNITY): Payer: Medicare Other

## 2022-02-10 DIAGNOSIS — I469 Cardiac arrest, cause unspecified: Secondary | ICD-10-CM | POA: Diagnosis not present

## 2022-02-10 DIAGNOSIS — I4811 Longstanding persistent atrial fibrillation: Secondary | ICD-10-CM | POA: Diagnosis not present

## 2022-02-10 DIAGNOSIS — J9601 Acute respiratory failure with hypoxia: Secondary | ICD-10-CM | POA: Diagnosis not present

## 2022-02-10 DIAGNOSIS — J189 Pneumonia, unspecified organism: Secondary | ICD-10-CM | POA: Diagnosis not present

## 2022-02-10 LAB — BASIC METABOLIC PANEL
Anion gap: 6 (ref 5–15)
BUN: 40 mg/dL — ABNORMAL HIGH (ref 8–23)
CO2: 38 mmol/L — ABNORMAL HIGH (ref 22–32)
Calcium: 9.3 mg/dL (ref 8.9–10.3)
Chloride: 97 mmol/L — ABNORMAL LOW (ref 98–111)
Creatinine, Ser: 0.65 mg/dL (ref 0.44–1.00)
GFR, Estimated: >60 mL/min (ref >60)
Glucose, Bld: 128 mg/dL — ABNORMAL HIGH (ref 70–99)
Potassium: 3.9 mmol/L (ref 3.5–5.1)
Sodium: 141 mmol/L (ref 135–145)

## 2022-02-10 LAB — GLUCOSE, CAPILLARY
Glucose-Capillary: 131 mg/dL — ABNORMAL HIGH (ref 70–99)
Glucose-Capillary: 65 mg/dL — ABNORMAL LOW (ref 70–99)

## 2022-02-10 LAB — SURGICAL PCR SCREEN
MRSA, PCR: NEGATIVE
Staphylococcus aureus: NEGATIVE

## 2022-02-10 MED ORDER — TRACE MINERALS CU-MN-SE-ZN 300-55-60-3000 MCG/ML IV SOLN: INTRAVENOUS | Status: DC

## 2022-02-10 MED ORDER — ENOXAPARIN SODIUM 80 MG/0.8ML IJ SOSY
70.0000 mg | PREFILLED_SYRINGE | Freq: Once | INTRAMUSCULAR | Status: AC
  Administered 2022-02-10: 70 mg via SUBCUTANEOUS
  Filled 2022-02-10: qty 0.8

## 2022-02-10 MED ORDER — FENTANYL CITRATE (PF) 100 MCG/2ML IJ SOLN
INTRAMUSCULAR | Status: AC
  Filled 2022-02-10: qty 4

## 2022-02-10 MED ORDER — LIDOCAINE-EPINEPHRINE 2 %-1:50000 IJ SOLN
1.7000 mL | Freq: Once | INTRAMUSCULAR | Status: DC
  Filled 2022-02-10: qty 1.7

## 2022-02-10 MED ORDER — FENTANYL CITRATE (PF) 100 MCG/2ML IJ SOLN
INTRAMUSCULAR | Status: AC | PRN
  Administered 2022-02-10: 25 ug via INTRAVENOUS

## 2022-02-10 MED ORDER — MIDAZOLAM HCL 2 MG/2ML IJ SOLN
INTRAMUSCULAR | Status: AC | PRN
  Administered 2022-02-10: .5 mg via INTRAVENOUS

## 2022-02-10 MED ORDER — TRACE MINERALS CU-MN-SE-ZN 300-55-60-3000 MCG/ML IV SOLN
INTRAVENOUS | Status: DC
  Filled 2022-02-10: qty 707.2

## 2022-02-10 MED ORDER — MIDAZOLAM HCL 2 MG/2ML IJ SOLN
INTRAMUSCULAR | Status: AC
  Filled 2022-02-10: qty 4

## 2022-02-10 NOTE — Progress Notes (Signed)
MEDICATION-RELATED CONSULT NOTE   IR Procedure Consult - Anticoagulant/Antiplatelet PTA/Inpatient Med List Review by Pharmacist    Procedure: abdominal drain placement    Completed: 02/10/22 at 10:25am  Post-Procedural bleeding risk per IR MD assessment:  standard  Antithrombotic medications on inpatient or PTA profile prior to procedure:     Lovenox 100 mg SQ q24h (~ 1.5 mg/kg, therapeutic dose) - held this am  PTA Xarelto for hx atrial fibrillation has been on hold for procedures.   Recommended restart time per IR Post-Procedure Guidelines:    - resume Lovenox at least 6 hours post-procedure with 1 mg/kg x 1; then resume ~1.5 mg/kg q24h tomorrow.  Other considerations:     Weights have varied during this admission, ~64-80 kg.  100 mg dose is ~1.4 mg/kg today's weight of 72.1 kg. Syringe size, reasonable to continue same regimen.  Plan:      Lovenox 70 mg SQ x 1 tonight, ~1 mg/kg  Resume Lovenox 100 mg SQ q24h on 9/7.  Per prior MD notes, may change Xarelto to Eliquis when DOAC resumed.  Arty Baumgartner, [place] 02/10/2022 12:18 PM

## 2022-02-10 NOTE — Progress Notes (Signed)
PROGRESS NOTE        PATIENT DETAILS Name: [name] Age: 81 y.o. Sex: female Date of Birth: 1940/11/09 Admit Date: 12/20/2021 Admitting Physician Kayleen Memos, DO XTA:[id number], [place] @ [place]  Brief Summary: Patient is a 81 y.o.  female with history of persistent atrial fibrillation, HTN, chronic HFpEF, breast cancer on letrozole, pulmonary hypertension, asthma-who presented to the hospital on 7/28 with cough/shortness of breath-she was found to have acute hypoxic respiratory failure due to right lobar PNA.  She was started on IV antibiotics and admitted to the hospitalist service.  On 7/30-she sustained a in-hospital cardiac arrest thought to be due to underlying respiratory decline from pneumonia, she was intubated and transferred to the ICU.  CT chest showed a large right-sided pleural effusion-she underwent chest tube placement.  She was stabilized and transferred back to [facility].  Unfortunately-further hospital course was complicated by development of acute abdomen in the setting of ischemic colon with perforation requiring multiple laparotomies and bowel resection.  She remained in the ICU for while she underwent numerous surgical procedures-once stabilized-she was extubated and transferred back to [facility] on 8/31.  See below for further details.  Significant events: 7/28>> admit to [facility]-sepsis with hypoxia due to PNA.  7/30>> PEA arrest.  Transfer to ICU.  CT chest with worsening pleural effusion. 7/31>> Chest tube placed 8/01>> patient removed chest tube.   8/06>> Transfer to [facility] 8/07>> back to ICU-acute abdomen-hypertension-A-fib RVR.  Found to have ischemic colon with contained perforation-underwent exploratory laparotomy and colon resection. 8/09>> exploratory laparotomy/intestinal anastomosis and abdominal closure. 8/10>> Extubated 8/13 Transfer to [facility] 8/20>> CT A/P with fuid collection in R mid-abdomen with c/f anastomotic leak. 8/21>>  Back to ICU for resp distress 8/22>> CT-guided drain placement (RLQ) for fluid assessment, abscess vs. Anastomotic leak 8/23>> Taken back to OR for ex-lap/concern for anastomotic leak; extensive LOA with findings of feculent peritonitis with +leak; enterotomies requiring additional SBR and end ileostomy. 8/25>> Made DNR after palliative meeting 8/26>> Extubated. Right thoracentesis with removal of 600 cc 8/28>> respiratory distress, urgent thoracentesis removal of 600 cc, diuresis with Lasix, came off BiPAP 8/31>> transferred to [facility]. 9/02>> slightly worsening shortness of breath at rest-CXR with persistent right pleural effusion-discussed with PCCM-no need for thoracocentesis.  DNR reconfirmed-patient does not want BiPAP as well. 9/04>> febrile/tachycardic-CT abdomen/pelvis-with lung infiltrates and new intra-abdominal fluid action-started Christa See antibiotics-IR consulted for aspiration. 9/06>> CT-guided 12 French drain placed by IR for intra-abdominal fluid collection.  500 cc of serous fluid was drained.  Significant studies: 7/29>> CT angio chest: No PE, large areas of consolidation with air bronchogram involving right lower/right middle lobe. 7/30>> CT head: No acute intracranial abnormality. 7/30>> CT chest/abdomen/pelvis: Worsening right pleural effusion, panchamber cardiomegaly 7/31>> Echo: EF 25-30%, changes consistent with chronic Takotsubo cardiomyopathy.  RV Eduardo Osier function is mildly reduced. 8/02>> CT head: No acute intracranial process 8/02>> Spot EEG: No seizures. 8/06>> CT abdomen pelvis: Interval development of pneumatosis involving cecum/ascending colon-concerning for ischemic colitis.  Associated small bowel obstruction. 8/14>> Limited echo: EF 60-65%.  RV systolic function moderately reduced. 8/17>> left upper extremity Doppler: No DVT. 8/20>> CT abdomen/pelvis: Surgical changes of right hemicolectomy-extraluminal collection of gas/fluid in the right mid abdomen.  Extensive  infiltrative changes within the mesentery of the right abdomen.   9/04>> CT chest: Decreasing right pleural effusion-interval improvement of aeration in  right middle/right lower lobe-still fairly extensive infiltrates in the right upper lobe.  Subcutaneous stranding in the anterior chest wall close to the left axilla suggesting contusion/cellulitis. 9/05>> CT abdomen/pelvis: Postsurgical changes-loculated fluid collection in anterior abdominal area present to seroma/developing abscess.   Significant microbiology data: 7/28>> blood culture: No growth 7/29>> urine culture: No growth 7/30>> BAL culture: No growth 7/31>> pleural fluid culture: No growth 8/18>> blood culture: No growth 8/19>> urine culture: Enterococcus faecalis 8/21>> tracheal aspirate: No growth 8/21>> blood culture: No growth 8/22>> abscess abdomen: Enterococcus/Klebsiella 8/26>> pleural fluid culture: No growth 9/04>> blood culture: No growth 9/05>> urine culture: Pseudomonas. 9/06>> intra-abdominal/fluid collection culture: Pending  Consults: PCCM, cardiology, palliative care, general surgery, interventional radiology, neurology  Subjective: Much more awake and alert compared to yesterday.  Objective: Vitals: Blood pressure (!) 167/103, pulse (!) 117, temperature 98.2 F (36.8 C), temperature source Oral, resp. rate (!) 22, height 5' (1.524 m), weight 72.1 kg, SpO2 99 %.   Exam: Gen Exam:Alert awake-not in any distress HEENT:atraumatic, normocephalic Chest: B/L clear to auscultation anteriorly CVS:S1S2 regular Abdomen:soft non tender, non distended Extremities:++ edema Neurology: Non focal Skin: no rash   Pertinent Labs/Radiology:    Latest Ref Rng & Units 02/09/2022   10:02 AM 02/08/2022    9:51 PM 02/08/2022    3:09 AM  CBC  WBC 4.0 - 10.5 K/uL 11.3  12.1  9.7   Hemoglobin 12.0 - 15.0 g/dL 8.4  8.2  8.4   Hematocrit 36.0 - 46.0 % 27.5  26.3  26.8   Platelets 150 - 400 K/uL 271  275  251     Lab  Results  Component Value Date   NA 141 02/10/2022   K 3.9 02/10/2022   CL 97 (L) 02/10/2022   CO2 38 (H) 02/10/2022      Assessment/Plan: Sepsis likely due to intra-abdominal abscess/possible aspiration PNA/complicated UTI: Afebrile overnight-clinically improved-await culture data-continue empiric antibiotics.  Suspect that UTI is related to Foley catheter that has been in place due to critical illness/encephalopathy/urinary retention.  We will ask nurse to see if we can remove Foley catheter today and attempt a voiding trial.  Acute hypoxic respiratory failure Multifocal PNA Parapneumonic effusion-s/p chest tube placement-removed 8/1 Continues to have episodes of labored breathing-unclear whether this is from PNA/volume overload or anxiety.  Sepsis physiology reoccurred on 9/4-has been started on antibiotics-see above.  Note-discussed imaging findings with PCCM on 9/2-it was felt that no further thoracocentesis was warranted.  Ischemic bowel with perforation-s/p right colectomy and exploratory laparotomy on 8/7. S/p exploratory laparotomy-bowel anastomosis on 8/9 Complicated by anastomotic leak with peritonitis-s/p exploratory laparotomy, extensive LOA, resection of distal small bowel, and ileocolonic anastomosis, creation of end ileostomy on 8/23 Intra-abdominal fluid collection seen on CT abdomen on 9/5 s/p CT-guided drainage on 9/6 Empiric antibiotics restarted on 9/5 for intra-abdominal fluid collection-General surgery following and directing care.  Diet has been advanced but patient has very poor appetite-on Remeron.  Remains on TNA.   Septic shock with bowel perforation and intra-abdominal abscess:  Sepsis physiology had resolved-she had completed a course of IV antibiotics-now febrile overnight-restarted on IV antibiotics on 9/4.  See above.  PEA arrest Felt to be due to respiratory issues-continue telemetry monitoring.  Initial echo showed changes consistent with Takotsubo  cardiomyopathy-thankfully a limited echo few days later showed normal EF.  Takotsubo cardiomyopathy with recovered EF Acute on chronic HFpEF Volume overload due to third spacing/hypoalbuminemia Volume status has improved-on IV Lasix-continue beta-blocker-continue to follow electrolytes/intake/output and weights.  Persistent atrial fibrillation:  On beta-blocker-anticoagulated with Lovenox-once not thought to require any surgical procedures-we will switch to oral agent.  History of moderate pulmonary hypertension Will need close outpatient follow-up with pulmonology and cardiology.  HTN BP stable on metoprolol.  History of bronchial asthma: Not in exacerbation-continue bronchodilators.  Acute metabolic encephalopathy Due to septic shock/peritonitis/hypoxemia.  Somewhat lethargic this morning but was able to answer questions appropriately.  EEG negative for seizures.  Due to ongoing sepsis physiology-her encephalopathy may worsen.  Normocytic anemia Due to critical illness-transfuse if significant drop.  No evidence of blood loss.  History of breast cancer-s/p right mastectomy on 01/24/2017-completed adjuvant radiation November 2018 resume letrozole when able.  Severe debility/deconditioning PT/OT Probably SNF on discharge.  Palliative care: DNR in place-if she develops worsening respiratory failure-she does not desire BiPAP-understands that apart from transitioning to comfort measures-no further options available.  She has redeveloped sepsis physiology-we will have to watch her closely-continue IV antibiotics-if no improvement-likely will need comfort measures.  Nutrition Status: Nutrition Problem: Severe Malnutrition Etiology: acute illness Signs/Symptoms: mild fat depletion, mild muscle depletion, moderate muscle depletion, severe fat depletion Interventions: TPN, Boost Breeze, Refer to RD note for recommendations   Pressure Ulcer: Pressure Injury 01/17/2022 Nose Left;Right  Stage 2 -  Partial thickness loss of dermis presenting as a shallow open injury with a red, pink wound bed without slough. raw and pink area with scabs very painful (Active)  01/08/2022 1147  Location: Nose  Location Orientation: Left;Right  Staging: Stage 2 -  Partial thickness loss of dermis presenting as a shallow open injury with a red, pink wound bed without slough.  Wound Description (Comments): raw and pink area with scabs very painful  Present on Admission: No  Dressing Type None 02/09/22 0800   Obesity: Estimated body mass index is 31.04 kg/m as calculated from the following:   Height as of this encounter: 5' (1.524 m).   Weight as of this encounter: 72.1 kg.   Code status:   Code Status: DNR   DVT Prophylaxis: Place and maintain sequential compression device Start: 01/12/2022 1701 SCDs Start: 01/10/2022 0737 therapeutic Lovenox  Family Communication: Son-[phone]-called on 9/6  Disposition Plan: Status is: Inpatient Remains inpatient appropriate because: Severity of illness-diet being advanced-on IV diuretics for volume overload.  Now with sepsis physiology-back on IV antibiotics.   Planned Discharge Destination: CIR evaluation in progress.   Diet: Diet Order             Diet NPO time specified Except for: Sips with Meds  Diet effective midnight                     Antimicrobial agents: Anti-infectives (From admission, onward)    Start     Dose/Rate Route Frequency Ordered Stop   02/09/22 2200  vancomycin (VANCOREADY) IVPB 750 mg/150 mL        750 mg 150 mL/hr over 60 Minutes Intravenous Every 24 hours 02/08/22 2143     02/08/22 2230  piperacillin-tazobactam (ZOSYN) IVPB 3.375 g        3.375 g 12.5 mL/hr over 240 Minutes Intravenous Every 8 hours 02/08/22 2143     02/08/22 2230  vancomycin (VANCOREADY) IVPB 1500 mg/300 mL        1,500 mg 150 mL/hr over 120 Minutes Intravenous  Once 02/08/22 2143 02/09/22 0011   01/26/22 1400   piperacillin-tazobactam (ZOSYN) IVPB 3.375 g        3.375 g 12.5 mL/hr over 240 Minutes Intravenous  Every 8 hours 01/26/22 0945 02/02/22 0150   01/30/2022 1030  vancomycin (VANCOREADY) IVPB 750 mg/150 mL  Status:  Discontinued        750 mg 150 mL/hr over 60 Minutes Intravenous Every 24 hours 01/12/2022 0937 01/28/22 1520   01/09/2022 1000  vancomycin (VANCOREADY) IVPB 1500 mg/300 mL  Status:  Discontinued        1,500 mg 150 mL/hr over 120 Minutes Intravenous Every 48 hours 01/23/22 0909 01/06/2022 0937   01/23/22 1030  vancomycin (VANCOREADY) IVPB 1250 mg/250 mL        1,250 mg 166.7 mL/hr over 90 Minutes Intravenous  Once 01/23/22 0907 01/23/22 1259   01/23/22 1000  metroNIDAZOLE (FLAGYL) IVPB 500 mg  Status:  Discontinued        500 mg 100 mL/hr over 60 Minutes Intravenous Every 12 hours 01/23/22 0839 01/26/22 0945   01/23/22 1000  ceFEPIme (MAXIPIME) 2 g in sodium chloride 0.9 % 100 mL IVPB  Status:  Discontinued        2 g 200 mL/hr over 30 Minutes Intravenous Every 12 hours 01/23/22 0901 01/26/22 0945   01/29/2022 1400  micafungin (MYCAMINE) 100 mg in sodium chloride 0.9 % 100 mL IVPB  Status:  Discontinued        100 mg 105 mL/hr over 1 Hours Intravenous Every 24 hours 01/30/2022 1302 01/15/22 1211   01/10/22 1515  piperacillin-tazobactam (ZOSYN) IVPB 3.375 g        3.375 g 12.5 mL/hr over 240 Minutes Intravenous Every 8 hours 01/10/22 1427 01/23/2022 2359   01/04/22 2000  vancomycin (VANCOCIN) IVPB 1000 mg/200 mL premix  Status:  Discontinued        1,000 mg 200 mL/hr over 60 Minutes Intravenous Every 24 hours 01/03/22 1910 01/05/22 1321   01/03/22 2200  ceFEPIme (MAXIPIME) 2 g in sodium chloride 0.9 % 100 mL IVPB        2 g 200 mL/hr over 30 Minutes Intravenous Every 12 hours 01/03/22 1910 01/08/22 2209   01/03/22 1930  vancomycin (VANCOREADY) IVPB 1250 mg/250 mL        1,250 mg 166.7 mL/hr over 90 Minutes Intravenous  Once 01/03/22 1910 01/03/22 2219   01/02/22 2100  azithromycin  (ZITHROMAX) 500 mg in sodium chloride 0.9 % 250 mL IVPB        500 mg 250 mL/hr  Intravenous Every 24 hours 12/21/2021 2248 01/06/22 2149   01/02/22 2000  cefTRIAXone (ROCEPHIN) 2 g in sodium chloride 0.9 % 100 mL IVPB  Status:  Discontinued        2 g 200 mL/hr over 30 Minutes Intravenous Every 24 hours 12/17/2021 2248 01/03/22 1733   12/29/2021 2030  cefTRIAXone (ROCEPHIN) 1 g in sodium chloride 0.9 % 100 mL IVPB        1 g 200 mL/hr over 30 Minutes Intravenous  Once 12/25/2021 2025 12/21/2021 2141   12/13/2021 2030  azithromycin (ZITHROMAX) 500 mg in sodium chloride 0.9 % 250 mL IVPB        500 mg 250 mL/hr over 60 Minutes Intravenous  Once 12/26/2021 2025 12/11/2021 2312        MEDICATIONS: Scheduled Meds:  arformoterol  15 mcg Nebulization BID   budesonide (PULMICORT) nebulizer solution  0.25 mg Nebulization BID   enoxaparin (LOVENOX) injection  100 mg Subcutaneous Q24H   enoxaparin (LOVENOX) injection  70 mg Subcutaneous Once   feeding supplement  1 Container Oral TID BM   fentaNYL  furosemide  60 mg Intravenous Q12H   lidocaine-EPINEPHrine  1.7 mL Intradermal Once   metoprolol succinate  50 mg Oral Daily   midazolam       mirtazapine  7.5 mg Oral QHS   pantoprazole (PROTONIX) IV  40 mg Intravenous Q24H   revefenacin  175 mcg Nebulization Daily   saline  1 Application Each Nare N8G   Continuous Infusions:  sodium chloride 10 mL/hr at 02/08/22 2006   sodium chloride 250 mL (02/07/22 0959)   piperacillin-tazobactam (ZOSYN)  IV 3.375 g (95/62/13 0865)   TPN CYCLIC-ADULT (ION)     vancomycin 750 mg (02/09/22 2256)   PRN Meds:.acetaminophen, fentaNYL, guaiFENesin, haloperidol lactate, levalbuterol, lip balm, methocarbamol, midazolam, morphine CONCENTRATE, ondansetron (ZOFRAN) IV, oxyCODONE, sodium chloride, sodium chloride, sodium chloride flush, white petrolatum   I have personally reviewed following labs and imaging studies  LABORATORY DATA: CBC: Recent Labs  Lab  02/06/22 0500 02/07/22 0515 02/08/22 0309 02/08/22 2151 02/09/22 1002  WBC 9.0 9.2 9.7 12.1* 11.3*  NEUTROABS 5.8 5.9 6.5 8.3* 8.9*  HGB 8.8* 8.8* 8.4* 8.2* 8.4*  HCT 27.8* 28.4* 26.8* 26.3* 27.5*  MCV 98.2 100.4* 105.5* 101.2* 102.2*  PLT 225 243 251 275 271     Basic Metabolic Panel: Recent Labs  Lab 02/04/22 0611 02/05/22 0650 02/06/22 0500 02/07/22 0515 02/08/22 0524 02/09/22 0025 02/09/22 1002 02/10/22 0520  NA 140 136 138 138 138 136 137 141  K 4.4 4.1 3.9 3.7 4.5 4.0 4.5 3.9  CL 96* 91* 94* 93* 91* 91* 93* 97*  CO2 39* 39* 38* 36* 39* 38* 36* 38*  GLUCOSE 117* 124* 121* 135* 117* 127* 107* 128*  BUN 26* 29* 27* 26* 32* 37* 37* 40*  CREATININE 0.45 0.49 0.53 0.57 0.53 0.61 0.64 0.65  CALCIUM 9.2 9.3 9.4 9.2 9.4 9.3 9.5 9.3  MG 2.1 1.9 1.7 1.7 2.0 1.8  --   --   PHOS 3.6  --   --   --  4.3 4.1  --   --      GFR: Estimated Creatinine Clearance: 51.3 mL/min (by C-G formula based on SCr of 0.65 mg/dL).  Liver Function Tests: Recent Labs  Lab 02/08/22 0524 02/09/22 0025  AST 25 22  ALT 12 11  ALKPHOS 100 100  BILITOT 0.6 0.8  PROT 6.6 6.4*  ALBUMIN 1.9* 1.9*    No results for input(s): "LIPASE", "AMYLASE" in the last 168 hours. No results for input(s): "AMMONIA" in the last 168 hours.  Coagulation Profile: Recent Labs  Lab 02/09/22 0025  INR 1.2     Cardiac Enzymes: No results for input(s): "CKTOTAL", "CKMB", "CKMBINDEX", "TROPONINI" in the last 168 hours.  BNP (last 3 results) No results for input(s): "PROBNP" in the last 8760 hours.  Lipid Profile: Recent Labs    02/08/22 0309 02/08/22 0552  TRIG 493* 151*     Thyroid Function Tests: No results for input(s): "TSH", "T4TOTAL", "FREET4", "T3FREE", "THYROIDAB" in the last 72 hours.  Anemia Panel: No results for input(s): "VITAMINB12", "FOLATE", "FERRITIN", "TIBC", "IRON", "RETICCTPCT" in the last 72 hours.  Urine analysis:    Component Value Date/Time   COLORURINE YELLOW  02/08/2022 2304   APPEARANCEUR HAZY (A) 02/08/2022 2304   LABSPEC 1.009 02/08/2022 2304   PHURINE 6.0 02/08/2022 2304   GLUCOSEU NEGATIVE 02/08/2022 2304   HGBUR MODERATE (A) 02/08/2022 2304   BILIRUBINUR NEGATIVE 02/08/2022 2304   KETONESUR NEGATIVE 02/08/2022 2304   PROTEINUR NEGATIVE 02/08/2022 2304   UROBILINOGEN 0.2  01/22/2013 1345   NITRITE NEGATIVE 02/08/2022 2304   LEUKOCYTESUR LARGE (A) 02/08/2022 2304    Sepsis Labs: Lactic Acid, Venous    Component Value Date/Time   LATICACIDVEN 0.7 02/09/2022 0025    MICROBIOLOGY: Recent Results (from the past 240 hour(s))  Culture, blood (Routine X 2) w Reflex to ID Panel     Status: None (Preliminary result)   Collection Time: 02/08/22  9:48 PM   Specimen: BLOOD  Result Value Ref Range Status   Specimen Description BLOOD RIGHT ANTECUBITAL  Final   Special Requests   Final    BOTTLES DRAWN AEROBIC AND ANAEROBIC Blood Culture adequate volume   Culture   Final    NO GROWTH 2 DAYS Performed at [facility], [address], [place], [place] [address]    Report Status PENDING  Incomplete  Culture, blood (Routine X 2) w Reflex to ID Panel     Status: None (Preliminary result)   Collection Time: 02/08/22  9:50 PM   Specimen: BLOOD RIGHT HAND  Result Value Ref Range Status   Specimen Description BLOOD RIGHT HAND  Final   Special Requests   Final    BOTTLES DRAWN AEROBIC AND ANAEROBIC Blood Culture adequate volume   Culture   Final    NO GROWTH 2 DAYS Performed at [facility], [place] [address], [place], [place] [address]    Report Status PENDING  Incomplete  Remove and replace urinary cath (placed > 5 days) then obtain urine culture from new indwelling urinary catheter.     Status: Abnormal (Preliminary result)   Collection Time: 02/09/22  5:13 AM   Specimen: Urine, Catheterized  Result Value Ref Range Status   Specimen Description URINE, CATHETERIZED  Final   Special Requests NONE  Final   Culture (A)  Final     >=100,000 COLONIES/mL PSEUDOMONAS AERUGINOSA SUSCEPTIBILITIES TO FOLLOW Performed at [facility], [address], [place], [place] [address]    Report Status PENDING  Incomplete  Surgical PCR screen     Status: None   Collection Time: 02/10/22  1:10 AM   Specimen: Nasal Mucosa; Nasal Swab  Result Value Ref Range Status   MRSA, PCR NEGATIVE NEGATIVE Final   Staphylococcus aureus NEGATIVE NEGATIVE Final    Comment: (NOTE) The Xpert SA Assay (FDA approved for NASAL specimens in patients 74 years of age and older), is one component of a comprehensive surveillance program. It is not intended to diagnose infection nor to guide or monitor treatment. Performed at [facility], [place] [address] [place], [place] [address]     RADIOLOGY STUDIES/RESULTS: CT ABDOMEN PELVIS WO CONTRAST  Result Date: 02/09/2022 CLINICAL DATA:  Sepsis. EXAM: CT ABDOMEN AND PELVIS WITHOUT CONTRAST TECHNIQUE: Multidetector CT imaging of the abdomen and pelvis was performed following the standard protocol without IV contrast. RADIATION DOSE REDUCTION: This exam was performed according to the departmental dose-optimization program which includes automated exposure control, adjustment of the mA and/or kV according to patient size and/or use of iterative reconstruction technique. COMPARISON:  CT abdomen pelvis dated 01/24/2022. FINDINGS: Evaluation of this exam is limited in the absence of intravenous contrast. Lower chest: Trace left and partially visualized small right pleural effusions. There is consolidative changes of the visualized right lung base. Mild cardiomegaly. The tip of a central venous line noted in the right atrium. No intra-abdominal free air. Diffuse mesenteric stranding and edema. Hepatobiliary: Cirrhosis. No biliary dilatation. Small gallstone. No evidence of acute cholecystitis  by CT. Pancreas: Unremarkable. No pancreatic ductal dilatation or surrounding inflammatory changes. Spleen: Normal in size  without focal abnormality. Adrenals/Urinary Tract: The adrenal glands unremarkable. There is no hydronephrosis on either side. There is a 3 mm nonobstructing right renal interpolar calculus or cortical calcification. The visualized ureters appear unremarkable. The urinary bladder is decompressed around a Foley catheter. Stomach/Bowel: There is postsurgical changes of the bowel with a right lower quadrant ostomy. There is sigmoid diverticulosis without active inflammatory changes. No evidence of bowel obstruction. Vascular/Lymphatic: Moderate aortoiliac atherosclerotic disease. The IVC is unremarkable. No portal venous gas. There is no adenopathy. Reproductive: The uterus is anteverted.  No adnexal masses. Other: There is a loculated fluid collection in the anterior abdomen measuring approximately 14 x 6 cm in greatest axial dimensions and 13 cm in craniocaudal length which may represent a seroma or developing abscess. Musculoskeletal: There is diffuse subcutaneous edema. Midline vertical anterior pelvic wall open surgical incision. Osteopenia with degenerative changes of the spine. No acute osseous pathology. IMPRESSION: 1. Postsurgical changes of the bowel with a right lower quadrant ostomy. No evidence of bowel obstruction. 2. A loculated fluid collection in the anterior abdomen may represent a seroma or developing abscess. 3. Cirrhosis. 4. Cholelithiasis. 5. Sigmoid diverticulosis. 6. Trace left and partially visualized small right pleural effusions with consolidative changes of the visualized right lung base. 7. Aortic Atherosclerosis (ICD10-I70.0). Electronically Signed   By: Anner Crete M.D.   On: 02/09/2022 00:27   CT CHEST WO CONTRAST  Result Date: 02/08/2022 CLINICAL DATA:  Chronic dyspnea EXAM: CT CHEST WITHOUT CONTRAST TECHNIQUE: Multidetector CT imaging of the chest was performed following the standard protocol without IV contrast. RADIATION DOSE REDUCTION: This exam was performed according to  the departmental dose-optimization program which includes automated exposure control, adjustment of the mA and/or kV according to patient size and/or use of iterative reconstruction technique. COMPARISON:  Previous CT done on 01/03/2022 and chest radiograph done today FINDINGS: Cardiovascular: Heart is enlarged in size. There are scattered calcifications in thoracic aorta. Coronary artery calcifications are seen. Main pulmonary artery measures 3.3 cm suggesting possible pulmonary arterial hypertension. There is ectasia of ascending thoracic aorta measuring 3.6 cm. Mediastinum/Nodes: No significant lymphadenopathy seen in mediastinum. There is subcutaneous stranding in left anterior chest wall close to the axilla PICC line is noted in left upper extremity with its tip at the junction of superior vena cava and right atrium. Lungs/Pleura: There is linear patchy infiltrate in right upper lobe. There are patchy infiltrates in right middle lobe with atrial improvement. There is moderate size right pleural effusion. There is interval improvement in aeration in right lower lobe. There is residual partial atelectasis in inferior aspect of right lower lobe. Small patchy infiltrates are seen in left lower lobe and lingula. There is minimal left pleural effusion. There is no pneumothorax. Upper Abdomen: Unremarkable. Musculoskeletal: No acute findings are seen. Reconstruction prostheses are seen in both breasts. IMPRESSION: There is interval decrease in amount of right pleural effusion with moderate residual right pleural effusion. There is interval improvement in aeration in right middle lobe and right lower lobe suggesting decrease in atelectasis. Still, fairly extensive infiltrates are seen in right upper lobe, right middle lobe and right lower lobe suggesting atelectasis/pneumonia. Follow-up studies until complete clearing occurs should be considered to rule out any underlying neoplastic process. There are small patchy  infiltrates in the lingula and left lower lobe suggesting subsegmental atelectasis/pneumonia. Minimal left pleural effusion is seen. Cardiomegaly. Coronary artery calcifications are seen.  There is ectasia of main pulmonary artery suggesting pulmonary arterial hypertension. There is subcutaneous stranding in the anterior chest wall close to the left axilla suggesting contusion or cellulitis. Electronically Signed   By: Elmer Picker M.D.   On: 02/08/2022 15:41     LOS: 40 days   Oren Binet, MD  [facility]    To contact the attending provider between 7A-7P or the covering provider during after hours 7P-7A, please log into the web site [email or web address] and access using universal [place] password for that web site. If you do not have the password, please call the hospital operator.  02/10/2022, 12:11 PM

## 2022-02-10 NOTE — Progress Notes (Signed)
OT Cancellation Note  Patient Details Name: [name] MRN: [id number] DOB: 15-Jun-1940   Cancelled Treatment:    Reason Eval/Treat Not Completed: Patient at procedure or test/ unavailable (Patient off unit in IR.  Will attempt later today as schedule permits) Lodema Hong, [place]  Office [place] 02/10/2022, 9:33 AM

## 2022-02-10 NOTE — Progress Notes (Signed)
PT Cancellation Note  Patient Details Name: [name] MRN: [id number] DOB: [birth year]   Cancelled Treatment:    Reason Eval/Treat Not Completed: Fatigue/lethargy limiting ability to participate. Pt remains lethargic since return from procedure. PT will follow up as time allows.   Zenaida Niece 02/10/2022, 5:00 PM

## 2022-02-10 NOTE — Progress Notes (Addendum)
PHARMACY - TOTAL PARENTERAL NUTRITION CONSULT NOTE  Indication: Intolerance to enteral feeding / anastomotic leak  Patient Measurements: Height: 5' (152.4 cm) Weight: 72.1 kg (158 lb 15.2 oz) IBW/kg (Calculated) : 45.5 TPN AdjBW (KG): 53.7 Body mass index is 31.04 kg/m.  Assessment:  45 YOF admited for CAP causing respiratory failure, had a cardiac arrest after admission and was moved to the ICU.  Chest tube placed for parapneumonic effusion, extubated on 8/2. Sent out of ICU on 8/5. On 8/6 she developed ischemic colitis, initially treated conservatively but transferred back to ICU for worsening shock and confusion 8/7.  Now s/p ex lap, R colectomy for ischemic bowel with perforation. Pt was provided TPN from 8/9-8/15. Pt's diet was advanced and pt ate 25-50% of meals from 8/15-8/20. Diet was changed to CLD on 8/20 for IR placement of drain for anastomotic leak. However, drain was not placed because pt acutely decompensated and was intubated and admitted to ICU and made NPO on 8/21. IR placed drain on 8/22 and drainage was positive for enteric contents confirming anastomotic leak. Pharmacy consulted to manage TPN 8/23.  9/4: Continues with low po intake - < 25% of meals +small amt of Boost Breeze. Plan back to OR today for removal of wound.  Glucose / Insulin: A1c 4.9% - CBGs < 140. Not on SSI Electrolytes:  Mg 1.8 (max in TPN),  Cl 97 (on max Cl), CO2 38, CoCa 10.8   Goal K > 4 and Mg > 2 for ileus and Afib Renal: SCr 0.65, BUN 40; on Lasix 60 IV BID Hepatic: LFTs / tbili WNL, TG 151, albumin 1.9 Intake / Output; MIVF: UOP 1.4 ml/kg/hr, colostmy 297m GI Imaging: 8/6 KUB: Mild gaseous distension favors ileus  8/6 CT: concerning for iscehmic colitis; possible SBO and/or inflammatory ileus  8/7 KUB: diffuse gaseous small bowel and colonic distension 8/20 CT: extraluminal collection of gas and fluid possibly representing an anastomotic leak, postop fluid collection, or abscess 9/5 CT:  loculated fluid collection may be seroma or developing abscess  GI Surgeries / Procedures:  8/7 Ex lap, colon resection 8/9 Ex lap, intestinal anastomosis, wound closure 8/22 CT-guided anterior RLQ abscess drain placement 8/23 Ex lap with resection of distal small bowel and ileocolonic anastomosis, creation of end ileostomy, and lysis of adhesions  9/6 CT guided perc drain   Central access: PICC placed 01/12/22 TPN start date:  01/17/2022-01/19/22, restarted 8/23  Nutritional Goals: RD Estimated Needs Total Energy Estimated Needs: 1700-1900 kcals Total Protein Estimated Needs: 95-115 g Total Fluid Estimated Needs: >/= 1.5 L  Current Nutrition:  TPN  8/30 FLD 9/1-9/5 Soft diet- eating 0 - 25% of meals  9/6 NPO for procedure  Plan:  Continue cycled concentrated goal TPN over 18 hours (rate of 40-80 ml/hr, GIR 1.5-3 mg/kg/min), provides 106 g AA, 224 g CHO, 53 g lipids, and 1717 kCal, meeting 100% of nutrition needs Electrolytes in TPN: Continue Na 25 mEq, K 73 mEq/L, Ca 0 mEq/L, max Mg at 15 mEq/L, Phos 16 mmol/L, max CL. Add standard MVI and trace elements to TPN  Continue CBG checks Q12H to monitor for hypoglycemia Standard TPN labs on Mon/Thurs Daily BMP while on Lasix scheduled F/u po intake, cycle or wean TPN as tolerated   LBenetta Spar PharmD, BCPS, BPhoebe Worth Medical CenterClinical Pharmacist  Please check AMION for all [place] numbers After 10:00 PM, call [place]

## 2022-02-10 NOTE — Progress Notes (Signed)
[facility] Progress Note  14 Days Post-Op  Subjective: Just returned from IR. 500 cc serous fluid aspiration from abdominal wall. Denies pain. Reports feeling cold.   Objective: Vital signs in last 24 hours: Temp:  [98 F (36.7 C)-99.6 F (37.6 C)] 98.2 F (36.8 C) (09/06 1058) Pulse Rate:  [97-122] 117 (09/06 1058) Resp:  [20-30] 22 (09/06 1058) BP: (90-167)/(47-103) 167/103 (09/06 1058) SpO2:  [88 %-100 %] 99 % (09/06 1058) Weight:  [72.1 kg] 72.1 kg (09/06 0457) Last BM Date : 02/08/22  Intake/Output from previous day: 09/05 0701 - 09/06 0700 In: -  Out: 2675 [Urine:2425; Stool:250] Intake/Output this shift: Total I/O In: 0  Out: 1375 [Urine:875; Drains:500]  PE: Gen:  Alert, LUE tremor, ill appearing on NRB Abd: Soft, appropriately tender, nondistended; ostomy viable with thin enteric contents in ostomy bag.  Dressing c/d/I. New IR gravity drain in place with SS fluid.    Lab Results:  Recent Labs    02/08/22 2151 02/09/22 1002  WBC 12.1* 11.3*  HGB 8.2* 8.4*  HCT 26.3* 27.5*  PLT 275 271   BMET Recent Labs    02/09/22 1002 02/10/22 0520  NA 137 141  K 4.5 3.9  CL 93* 97*  CO2 36* 38*  GLUCOSE 107* 128*  BUN 37* 40*  CREATININE 0.64 0.65  CALCIUM 9.5 9.3   PT/INR Recent Labs    02/09/22 0025  LABPROT 14.8  INR 1.2   CMP     Component Value Date/Time   NA 141 02/10/2022 0520   NA 139 11/18/2021 1432   NA 134 (L) 04/25/2017 1216   K 3.9 02/10/2022 0520   K 4.3 04/25/2017 1216   CL 97 (L) 02/10/2022 0520   CO2 38 (H) 02/10/2022 0520   CO2 29 04/25/2017 1216   GLUCOSE 128 (H) 02/10/2022 0520   GLUCOSE 85 04/25/2017 1216   BUN 40 (H) 02/10/2022 0520   BUN 10 11/18/2021 1432   BUN 14.4 04/25/2017 1216   CREATININE 0.65 02/10/2022 0520   CREATININE 0.72 01/17/2018 1347   CREATININE 0.7 04/25/2017 1216   CALCIUM 9.3 02/10/2022 0520   CALCIUM 10.0 04/25/2017 1216   PROT 6.4 (L) 02/09/2022 0025   PROT 7.2 04/25/2017 1216    ALBUMIN 1.9 (L) 02/09/2022 0025   ALBUMIN 3.7 04/25/2017 1216   AST 22 02/09/2022 0025   AST 20 01/17/2018 1347   AST 21 04/25/2017 1216   ALT 11 02/09/2022 0025   ALT 10 01/17/2018 1347   ALT 10 04/25/2017 1216   ALKPHOS 100 02/09/2022 0025   ALKPHOS 75 04/25/2017 1216   BILITOT 0.8 02/09/2022 0025   BILITOT 0.5 01/17/2018 1347   BILITOT 0.65 04/25/2017 1216   GFRNONAA >60 02/10/2022 0520   GFRNONAA >60 01/17/2018 1347   GFRAA 90 04/24/2020 1235   GFRAA >60 01/17/2018 1347   Lipase     Component Value Date/Time   LIPASE 77 07/28/2008 1630       Studies/Results: CT ABDOMEN PELVIS WO CONTRAST  Result Date: 02/09/2022 CLINICAL DATA:  Sepsis. EXAM: CT ABDOMEN AND PELVIS WITHOUT CONTRAST TECHNIQUE: Multidetector CT imaging of the abdomen and pelvis was performed following the standard protocol without IV contrast. RADIATION DOSE REDUCTION: This exam was performed according to the departmental dose-optimization program which includes automated exposure control, adjustment of the mA and/or kV according to patient size and/or use of iterative reconstruction technique. COMPARISON:  CT abdomen pelvis dated 01/24/2022. FINDINGS: Evaluation of this exam is limited in the  absence of intravenous contrast. Lower chest: Trace left and partially visualized small right pleural effusions. There is consolidative changes of the visualized right lung base. Mild cardiomegaly. The tip of a central venous line noted in the right atrium. No intra-abdominal free air. Diffuse mesenteric stranding and edema. Hepatobiliary: Cirrhosis. No biliary dilatation. Small gallstone. No evidence of acute cholecystitis by CT. Pancreas: Unremarkable. No pancreatic ductal dilatation or surrounding inflammatory changes. Spleen: Normal in size without focal abnormality. Adrenals/Urinary Tract: The adrenal glands unremarkable. There is no hydronephrosis on either side. There is a 3 mm nonobstructing right renal interpolar  calculus or cortical calcification. The visualized ureters appear unremarkable. The urinary bladder is decompressed around a Foley catheter. Stomach/Bowel: There is postsurgical changes of the bowel with a right lower quadrant ostomy. There is sigmoid diverticulosis without active inflammatory changes. No evidence of bowel obstruction. Vascular/Lymphatic: Moderate aortoiliac atherosclerotic disease. The IVC is unremarkable. No portal venous gas. There is no adenopathy. Reproductive: The uterus is anteverted.  No adnexal masses. Other: There is a loculated fluid collection in the anterior abdomen measuring approximately 14 x 6 cm in greatest axial dimensions and 13 cm in craniocaudal length which may represent a seroma or developing abscess. Musculoskeletal: There is diffuse subcutaneous edema. Midline vertical anterior pelvic wall open surgical incision. Osteopenia with degenerative changes of the spine. No acute osseous pathology. IMPRESSION: 1. Postsurgical changes of the bowel with a right lower quadrant ostomy. No evidence of bowel obstruction. 2. A loculated fluid collection in the anterior abdomen may represent a seroma or developing abscess. 3. Cirrhosis. 4. Cholelithiasis. 5. Sigmoid diverticulosis. 6. Trace left and partially visualized small right pleural effusions with consolidative changes of the visualized right lung base. 7. Aortic Atherosclerosis (ICD10-I70.0). Electronically Signed   By: Anner Crete M.D.   On: 02/09/2022 00:27   CT CHEST WO CONTRAST  Result Date: 02/08/2022 CLINICAL DATA:  Chronic dyspnea EXAM: CT CHEST WITHOUT CONTRAST TECHNIQUE: Multidetector CT imaging of the chest was performed following the standard protocol without IV contrast. RADIATION DOSE REDUCTION: This exam was performed according to the departmental dose-optimization program which includes automated exposure control, adjustment of the mA and/or kV according to patient size and/or use of iterative reconstruction  technique. COMPARISON:  Previous CT done on 01/03/2022 and chest radiograph done today FINDINGS: Cardiovascular: Heart is enlarged in size. There are scattered calcifications in thoracic aorta. Coronary artery calcifications are seen. Main pulmonary artery measures 3.3 cm suggesting possible pulmonary arterial hypertension. There is ectasia of ascending thoracic aorta measuring 3.6 cm. Mediastinum/Nodes: No significant lymphadenopathy seen in mediastinum. There is subcutaneous stranding in left anterior chest wall close to the axilla PICC line is noted in left upper extremity with its tip at the junction of superior vena cava and right atrium. Lungs/Pleura: There is linear patchy infiltrate in right upper lobe. There are patchy infiltrates in right middle lobe with atrial improvement. There is moderate size right pleural effusion. There is interval improvement in aeration in right lower lobe. There is residual partial atelectasis in inferior aspect of right lower lobe. Small patchy infiltrates are seen in left lower lobe and lingula. There is minimal left pleural effusion. There is no pneumothorax. Upper Abdomen: Unremarkable. Musculoskeletal: No acute findings are seen. Reconstruction prostheses are seen in both breasts. IMPRESSION: There is interval decrease in amount of right pleural effusion with moderate residual right pleural effusion. There is interval improvement in aeration in right middle lobe and right lower lobe suggesting decrease in atelectasis. Still, fairly extensive  infiltrates are seen in right upper lobe, right middle lobe and right lower lobe suggesting atelectasis/pneumonia. Follow-up studies until complete clearing occurs should be considered to rule out any underlying neoplastic process. There are small patchy infiltrates in the lingula and left lower lobe suggesting subsegmental atelectasis/pneumonia. Minimal left pleural effusion is seen. Cardiomegaly. Coronary artery calcifications are  seen. There is ectasia of main pulmonary artery suggesting pulmonary arterial hypertension. There is subcutaneous stranding in the anterior chest wall close to the left axilla suggesting contusion or cellulitis. Electronically Signed   By: Elmer Picker M.D.   On: 02/08/2022 15:41    Anti-infectives: Anti-infectives (From admission, onward)    Start     Dose/Rate Route Frequency Ordered Stop   02/09/22 2200  vancomycin (VANCOREADY) IVPB 750 mg/150 mL        750 mg 150 mL/hr over 60 Minutes Intravenous Every 24 hours 02/08/22 2143     02/08/22 2230  piperacillin-tazobactam (ZOSYN) IVPB 3.375 g        3.375 g 12.5 mL/hr over 240 Minutes Intravenous Every 8 hours 02/08/22 2143     02/08/22 2230  vancomycin (VANCOREADY) IVPB 1500 mg/300 mL        1,500 mg 150 mL/hr over 120 Minutes Intravenous  Once 02/08/22 2143 02/09/22 0011   01/26/22 1400  piperacillin-tazobactam (ZOSYN) IVPB 3.375 g        3.375 g 12.5 mL/hr over 240 Minutes Intravenous Every 8 hours 01/26/22 0945 02/02/22 0150   01/14/2022 1030  vancomycin (VANCOREADY) IVPB 750 mg/150 mL  Status:  Discontinued        750 mg 150 mL/hr over 60 Minutes Intravenous Every 24 hours 01/22/2022 0937 01/28/22 1520   01/10/2022 1000  vancomycin (VANCOREADY) IVPB 1500 mg/300 mL  Status:  Discontinued        1,500 mg 150 mL/hr over 120 Minutes Intravenous Every 48 hours 01/23/22 0909 01/15/2022 0937   01/23/22 1030  vancomycin (VANCOREADY) IVPB 1250 mg/250 mL        1,250 mg 166.7 mL/hr over 90 Minutes Intravenous  Once 01/23/22 0907 01/23/22 1259   01/23/22 1000  metroNIDAZOLE (FLAGYL) IVPB 500 mg  Status:  Discontinued        500 mg 100 mL/hr over 60 Minutes Intravenous Every 12 hours 01/23/22 0839 01/26/22 0945   01/23/22 1000  ceFEPIme (MAXIPIME) 2 g in sodium chloride 0.9 % 100 mL IVPB  Status:  Discontinued        2 g 200 mL/hr over 30 Minutes Intravenous Every 12 hours 01/23/22 0901 01/26/22 0945   01/10/2022 1400  micafungin (MYCAMINE) 100  mg in sodium chloride 0.9 % 100 mL IVPB  Status:  Discontinued        100 mg 105 mL/hr over 1 Hours Intravenous Every 24 hours 01/17/2022 1302 01/15/22 1211   01/10/22 1515  piperacillin-tazobactam (ZOSYN) IVPB 3.375 g        3.375 g 12.5 mL/hr over 240 Minutes Intravenous Every 8 hours 01/10/22 1427 01/10/2022 2359   01/04/22 2000  vancomycin (VANCOCIN) IVPB 1000 mg/200 mL premix  Status:  Discontinued        1,000 mg 200 mL/hr over 60 Minutes Intravenous Every 24 hours 01/03/22 1910 01/05/22 1321   01/03/22 2200  ceFEPIme (MAXIPIME) 2 g in sodium chloride 0.9 % 100 mL IVPB        2 g 200 mL/hr over 30 Minutes Intravenous Every 12 hours 01/03/22 1910 01/08/22 2209   01/03/22 1930  vancomycin (VANCOREADY) IVPB 1250  mg/250 mL        1,250 mg 166.7 mL/hr over 90 Minutes Intravenous  Once 01/03/22 1910 01/03/22 2219   01/02/22 2100  azithromycin (ZITHROMAX) 500 mg in sodium chloride 0.9 % 250 mL IVPB        500 mg 250 mL/hr  Intravenous Every 24 hours 12/31/2021 2248 01/06/22 2149   01/02/22 2000  cefTRIAXone (ROCEPHIN) 2 g in sodium chloride 0.9 % 100 mL IVPB  Status:  Discontinued        2 g 200 mL/hr over 30 Minutes Intravenous Every 24 hours 01/03/2022 2248 01/03/22 1733   12/06/2021 2030  cefTRIAXone (ROCEPHIN) 1 g in sodium chloride 0.9 % 100 mL IVPB        1 g 200 mL/hr over 30 Minutes Intravenous  Once 12/20/2021 2025 12/24/2021 2141   12/11/2021 2030  azithromycin (ZITHROMAX) 500 mg in sodium chloride 0.9 % 250 mL IVPB        500 mg 250 mL/hr over 60 Minutes Intravenous  Once 12/31/2021 2025 12/16/2021 2312        Assessment/Plan S/p exploratory laparotomy,  R colectomy, abthera placement 01/14/2022 Dr. Rosendo Gros S/p exploratory laparotomy, intestinal anastomosis, abdominal closure 01/31/2022 Dr. 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 Redmond Pulling - Ostomy with thin output.   -soft diet, breeze TID.  Continue TNA until better oral intake; continue 18h cycle tonight. - WOC consult for new ileostomy - Vac switched to BID WTD 9/4 - completed zosyn x5 days postop (end date 8/28), fever and repeat CT 9/5 w/ ventral abd wall fluid collection and vanc/zosyn restarted. - S/p IR drain 9/6, serous fluid consistent with seroma. Cx pending    FEN: soft, breeze, TNA ID: Zosyn (stop date 8/14), cefepime/flagyl 8/19 >>8/21, Vanc/zosyn 8/21 --> 8/28, vanc/zosyn 9/4> VTE: SCD's, hold DOAC/heparin gtt, LMWH '100mg'$      Below per TRH/CCM -- Septic shock due to above Acute hypoxic respiratory failure - per CCM, extubated on 8/26 Cardiac arrest 7/30, 7 mins CPR Hx cardiomyopathy COPD Afib, permanent CAP DNR   LOS: 18 days   Jill Alexanders, [facility] 02/10/2022, 11:25 AM Please see Amion for pager number during day hours 7:00am-4:30pm

## 2022-02-10 NOTE — Progress Notes (Signed)
   02/10/22 1615  Mobility  Activity Dangled on edge of bed  Level of Assistance +2 (takes two people)  Assistive Device None  Distance Ambulated (ft) 0 ft  Activity Response Tolerated poorly  $Mobility charge 1 Mobility   Mobility Specialist Progress Note  Pt was in bed and agreeable. Unable to walk d/t fatigue and unsteady gait. Returned back in bed w/ all needs met and call bell in reach.   Lucious Groves Mobility Specialist

## 2022-02-10 NOTE — Procedures (Signed)
Pre procedural Dx: Post op abdominal fluid collection Post procedural Dx: Same  Technically successful CT guided placed of a 12 Fr drainage catheter placement into the ventral aspect of the lower abdomen yielding 500 cc of serous fluid.   A representative aspirated sample was capped and sent to the laboratory for analysis.    EBL: Trace Complications: None immediate  Ronny Bacon, MD Pager #: [phone]

## 2022-02-10 NOTE — Progress Notes (Signed)
Inpatient Rehab Admissions Coordinator:     Insurance denied case for CIR. Pt. Currently unstable and not appropriate for CIR, so I will not appeal case at this time. I left voicemail for Pt.'s husband to notify him. We will follow up once pt. Is more medically stable and re-evaluate for CIR candidacy.  Clemens Catholic, [place], [place] Admissions Coordinator  [phone] ([place]) [phone] (office)

## 2022-02-11 ENCOUNTER — Inpatient Hospital Stay (HOSPITAL_COMMUNITY): Payer: Medicare Other

## 2022-02-11 DIAGNOSIS — J189 Pneumonia, unspecified organism: Secondary | ICD-10-CM | POA: Diagnosis not present

## 2022-02-11 DIAGNOSIS — J9601 Acute respiratory failure with hypoxia: Secondary | ICD-10-CM | POA: Diagnosis not present

## 2022-02-11 DIAGNOSIS — I119 Hypertensive heart disease without heart failure: Secondary | ICD-10-CM | POA: Diagnosis not present

## 2022-02-11 LAB — COMPREHENSIVE METABOLIC PANEL
ALT: 12 U/L (ref 0–44)
AST: 23 U/L (ref 15–41)
Albumin: 2.1 g/dL — ABNORMAL LOW (ref 3.5–5.0)
Alkaline Phosphatase: 84 U/L (ref 38–126)
Anion gap: 9 (ref 5–15)
BUN: 43 mg/dL — ABNORMAL HIGH (ref 8–23)
CO2: 39 mmol/L — ABNORMAL HIGH (ref 22–32)
Calcium: 9.3 mg/dL (ref 8.9–10.3)
Chloride: 96 mmol/L — ABNORMAL LOW (ref 98–111)
Creatinine, Ser: 0.77 mg/dL (ref 0.44–1.00)
GFR, Estimated: >60 mL/min (ref >60)
Glucose, Bld: 150 mg/dL — ABNORMAL HIGH (ref 70–99)
Potassium: 3.8 mmol/L (ref 3.5–5.1)
Sodium: 144 mmol/L (ref 135–145)
Total Bilirubin: 0.7 mg/dL (ref 0.3–1.2)
Total Protein: 7.7 g/dL (ref 6.5–8.1)

## 2022-02-11 LAB — BLOOD GAS, ARTERIAL
Acid-Base Excess: 16.2 mmol/L — ABNORMAL HIGH (ref 0.0–2.0)
Bicarbonate: 46.4 mmol/L — ABNORMAL HIGH (ref 20.0–28.0)
Drawn by: 55062
O2 Saturation: 99.8 %
Patient temperature: 37.1
pCO2 arterial: 86 mmHg (ref 32–48)
pH, Arterial: 7.34 — ABNORMAL LOW (ref 7.35–7.45)
pO2, Arterial: 189 mmHg — ABNORMAL HIGH (ref 83–108)

## 2022-02-11 LAB — GLUCOSE, CAPILLARY: Glucose-Capillary: 129 mg/dL — ABNORMAL HIGH (ref 70–99)

## 2022-02-11 LAB — MAGNESIUM: Magnesium: 1.9 mg/dL (ref 1.7–2.4)

## 2022-02-11 LAB — PHOSPHORUS: Phosphorus: 3.5 mg/dL (ref 2.5–4.6)

## 2022-02-11 LAB — URINE CULTURE: Culture: >=100000 — AB

## 2022-02-11 LAB — TRIGLYCERIDES: Triglycerides: 111 mg/dL (ref <150)

## 2022-02-11 MED ORDER — GLYCOPYRROLATE 0.2 MG/ML IJ SOLN: 0.2000 mg | INTRAMUSCULAR | Status: DC | PRN

## 2022-02-11 MED ORDER — DIPHENHYDRAMINE HCL 50 MG/ML IJ SOLN: 12.5000 mg | INTRAMUSCULAR | Status: DC | PRN

## 2022-02-11 MED ORDER — LORAZEPAM 2 MG/ML IJ SOLN: 1.0000 mg | INTRAMUSCULAR | Status: DC | PRN

## 2022-02-11 MED ORDER — LORAZEPAM 1 MG PO TABS: 1.0000 mg | ORAL_TABLET | ORAL | Status: DC | PRN

## 2022-02-11 MED ORDER — ONDANSETRON 4 MG PO TBDP
4.0000 mg | ORAL_TABLET | Freq: Four times a day (QID) | ORAL | Status: DC | PRN
  Filled 2022-02-11: qty 1

## 2022-02-11 MED ORDER — SODIUM CHLORIDE 0.9 % IV SOLN: 250.0000 mL | INTRAVENOUS | Status: DC | PRN

## 2022-02-11 MED ORDER — LORAZEPAM 2 MG/ML PO CONC
1.0000 mg | ORAL | Status: DC | PRN
  Filled 2022-02-11: qty 0.5

## 2022-02-11 MED ORDER — POLYVINYL ALCOHOL 1.4 % OP SOLN
1.0000 [drp] | Freq: Four times a day (QID) | OPHTHALMIC | Status: DC | PRN
Start: 2022-02-11 — End: 2022-02-11
  Filled 2022-02-11: qty 15

## 2022-02-11 MED ORDER — SODIUM CHLORIDE 0.9% FLUSH: 3.0000 mL | INTRAVENOUS | Status: DC | PRN

## 2022-02-11 MED ORDER — ALBUTEROL SULFATE (2.5 MG/3ML) 0.083% IN NEBU: 2.5000 mg | INHALATION_SOLUTION | RESPIRATORY_TRACT | Status: DC | PRN

## 2022-02-11 MED ORDER — HYDROMORPHONE BOLUS VIA INFUSION: 0.5000 mg | INTRAVENOUS | Status: DC | PRN

## 2022-02-11 MED ORDER — MORPHINE SULFATE (PF) 2 MG/ML IV SOLN: 2.0000 mg | Freq: Once | INTRAVENOUS | Status: DC

## 2022-02-11 MED ORDER — SODIUM CHLORIDE 0.9% FLUSH: 3.0000 mL | Freq: Two times a day (BID) | INTRAVENOUS | Status: DC

## 2022-02-11 MED ORDER — NALOXONE HCL 0.4 MG/ML IJ SOLN
INTRAMUSCULAR | Status: AC
  Filled 2022-02-11: qty 1

## 2022-02-11 MED ORDER — HYDROMORPHONE HCL 1 MG/ML IJ SOLN
0.5000 mg | INTRAMUSCULAR | Status: DC | PRN
  Administered 2022-02-11: 0.5 mg via INTRAVENOUS
  Filled 2022-02-11: qty 0.5

## 2022-02-11 MED ORDER — NYSTATIN 100000 UNIT/GM EX POWD
Freq: Three times a day (TID) | CUTANEOUS | Status: DC | PRN
  Filled 2022-02-11: qty 15

## 2022-02-11 MED ORDER — ONDANSETRON HCL 4 MG/2ML IJ SOLN: 4.0000 mg | Freq: Four times a day (QID) | INTRAMUSCULAR | Status: DC | PRN

## 2022-02-11 MED ORDER — GLYCOPYRROLATE 1 MG PO TABS
1.0000 mg | ORAL_TABLET | ORAL | Status: DC | PRN
  Filled 2022-02-11: qty 1

## 2022-02-11 MED ORDER — SODIUM CHLORIDE 0.9 % IV SOLN
1.0000 mg/h | INTRAVENOUS | Status: DC
  Filled 2022-02-11: qty 5

## 2022-02-13 LAB — CULTURE, BLOOD (ROUTINE X 2)
Culture: NO GROWTH
Culture: NO GROWTH
Special Requests: ADEQUATE
Special Requests: ADEQUATE

## 2022-02-15 LAB — AEROBIC/ANAEROBIC CULTURE W GRAM STAIN (SURGICAL/DEEP WOUND)
Culture: NO GROWTH
Gram Stain: NONE SEEN

## 2022-03-03 ENCOUNTER — Ambulatory Visit: Payer: Medicare Other | Admitting: Hematology and Oncology

## 2022-03-03 ENCOUNTER — Other Ambulatory Visit: Payer: Medicare Other

## 2022-03-07 NOTE — Progress Notes (Addendum)
Patient has increased work of breathing and complained of feeling anxious.Morphine '5mg'$  administered.

## 2022-03-07 NOTE — Progress Notes (Signed)
Patients oxygen saturations dropped to 50% on assessment patient was mouth breathing and lethargic. Advised patient of provider's approval for requested procedure, as well as any comments/instructions from provider.   Patient put on 15L non -rebreather mask , Dr [place] Crane and Rt notified.  Abg , chest xray and BiPAP ordered for patient.

## 2022-03-07 NOTE — Progress Notes (Signed)
Patient seen earlier in the morning immediately after I was notified of patients transition to comfort care. When I saw her she denied pain. Expressed that she was ready to die and felt tired. She appeared comfortable on NRB.   Appreciate TRH letting us know about the patients status change. It was an honor to be involved in this patients care. Husband not at bedside during my exam. Please reach out to [facility] if there is anything further we can offer.   Obie Dredge, PA-C [facility] Please see Amion for pager number during day hours 7:00am-4:30pm

## 2022-03-07 NOTE — Progress Notes (Signed)
IP rehab admissions - Noted transition to comfort care.  Currently refusing therapies.  Not appropriate for inpatient rehab admission at this time.  I will sign off for CIR at this point.  Call me for questions.  [phone]

## 2022-03-07 NOTE — Progress Notes (Signed)
SLP Cancellation Note  Patient Details Name: [name] MRN: [id number] DOB: July 10, 1940   Cancelled treatment:       Reason Eval/Treat Not Completed: Medical issues which prohibited therapy. Pt requiring BiPAP and made strictly NPO per MD for now. Will f/u as medically able to resume POs.     Osie Bond., M.A. [place] Office [phone]  Secure chat preferred  02/12/2022, 7:32 AM

## 2022-03-07 NOTE — Progress Notes (Signed)
RT called to beside due to increased work of breathing and desaturation of the patient. Patient initially placed on non-rebreather, she was placed on BIPAP with improvement in work of breathing and oxygen saturation. RT will continue to monitor.

## 2022-03-07 NOTE — Progress Notes (Signed)
This chaplain joined PMT NP-Julia in supporting the family with a spiritual care presence at the time of the Pt. death. The Pt. husband-Fred, grandson, and friend are at the bedside. The chaplain shared prayer with the Pt. and family as requested by [name]. [name] chooses to lift up a 44 year marriage to the Pt. and the ongoing companionship of the Pt. dog-Trouble.  This chaplain is available for F/U spiritual care as needed.  Chaplain Sallyanne Kuster [phone]

## 2022-03-07 NOTE — Progress Notes (Signed)
Received a call from bedside RN regarding the patient O2 saturation dropping to the 50's with mouth breathing.  She was promptly placed on NRB mask.  Presented at bedside.  Bedside RN, charge RN and RT were present.  The patient was placed on BIPAP.  Her work of breathing was improved on BIPAP.    Stat CXR, ABG, BIPAP ordered.  Personally reviewed CXR no significant changes from prior CXR.  PCO2 on ABG 86 with PH 7.34.  We will continue on BIPAP and start strict NPO while on BIPAP.  Updated the patient's husband via phone.  All questions answered to the best of my ability.

## 2022-03-07 NOTE — Death Summary Note (Signed)
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  female with history of persistent atrial fibrillation, HTN, chronic HFpEF, breast cancer on letrozole, pulmonary hypertension, asthma-who presented to the hospital on January 16, 2023 with cough/shortness of breath-she was found to have acute hypoxic respiratory failure due to right lobar PNA.  She was started on IV antibiotics and admitted to the hospitalist service.  On 7/30-she sustained a in-hospital cardiac arrest thought to be due to underlying respiratory decline from pneumonia, she was intubated and transferred to the ICU.  CT chest showed a large right-sided pleural effusion-she underwent chest tube placement.  She was stabilized and transferred back to [facility].  Unfortunately-further hospital course was complicated by development of acute abdomen in the setting of ischemic colon with perforation requiring multiple laparotomies and bowel resection.  She remained in the ICU for while she underwent numerous surgical procedures-once stabilized-she was extubated and transferred back to [facility] on 8/31.  See below for further details.   Significant events: 7/28>> admit  to [facility]-sepsis with hypoxia due to PNA.  7/30>> PEA arrest.  Transfer to ICU.  CT chest with worsening pleural effusion. 7/31>> Chest tube placed 8/01>> patient removed chest tube.   8/06>> Transfer to [facility] 8/07>> back to ICU-acute abdomen-hypertension-A-fib RVR.  Found to have ischemic colon with contained perforation-underwent exploratory laparotomy and colon resection. 8/09>> exploratory laparotomy/intestinal anastomosis and abdominal closure. 8/10>> Extubated 8/13 Transfer to [facility] 8/20>> CT A/P with fuid collection in R mid-abdomen with c/f anastomotic leak. 8/21>> Back to ICU for resp distress 8/22>> CT-guided drain placement (RLQ) for fluid assessment, abscess vs. Anastomotic leak 8/23>> Taken back to OR for ex-lap/concern for anastomotic leak; extensive LOA with findings of feculent peritonitis with +leak; enterotomies requiring additional SBR and end ileostomy. 8/25>> Made DNR after palliative meeting 8/26>> Extubated. Right thoracentesis with removal of 600 cc 8/28>> respiratory distress, urgent thoracentesis removal of 600 cc, diuresis with Lasix, came off BiPAP 8/31>> transferred to [facility]. 9/02>> slightly worsening shortness of breath at rest-CXR with persistent right pleural effusion-discussed with PCCM-no need for thoracocentesis.  DNR reconfirmed-patient does not want BiPAP as well. 9/04>> febrile/tachycardic-CT abdomen/pelvis-with lung infiltrates and new intra-abdominal fluid action-started Christa See antibiotics-IR consulted for aspiration. 9/06>> CT-guided 12 French drain placed by IR for intra-abdominal fluid collection.  500 cc of serous fluid was drained. 9/07>> overnight became hypercarbic-tachycardic-febrile-started on BiPAP.  This morning liberated off BiPAP-remains very lethargic-on Ventimask.  After discussion with son Sherren Mocha x2 over the phone-spouse at bedside-transitioned to full comfort measures.     Significant studies: 7/29>> CT angio chest: No PE, large areas of  consolidation with air bronchogram involving right lower/right middle lobe. 7/30>> CT head: No acute intracranial  abnormality. 7/30>> CT chest/abdomen/pelvis: Worsening right pleural effusion, panchamber cardiomegaly 7/31>> Echo: EF 25-30%, changes consistent with chronic Takotsubo cardiomyopathy.  RV Eduardo Osier function is mildly reduced. 8/02>> CT head: No acute intracranial process 8/02>> Spot EEG: No seizures. 8/06>> CT abdomen pelvis: Interval development of pneumatosis involving cecum/ascending colon-concerning for ischemic colitis.  Associated small bowel obstruction. 8/14>> Limited echo: EF 60-65%.  RV systolic function moderately reduced. 8/17>> left upper extremity Doppler: No DVT. 8/20>> CT abdomen/pelvis: Surgical changes of right hemicolectomy-extraluminal collection of gas/fluid in the right mid abdomen.  Extensive infiltrative changes within the mesentery of the right abdomen.   9/04>> CT chest: Decreasing right pleural effusion-interval improvement of aeration in right middle/right lower lobe-still fairly extensive infiltrates in the right upper lobe.  Subcutaneous stranding in the anterior chest wall close to the left axilla suggesting contusion/cellulitis. 9/05>> CT abdomen/pelvis: Postsurgical changes-loculated fluid collection in anterior abdominal area present to seroma/developing abscess.     Significant microbiology data: 7/28>> blood culture: No growth 7/29>> urine culture: No growth 7/30>> BAL culture: No growth 7/31>> pleural fluid culture: No growth 8/18>> blood culture: No growth 8/19>> urine culture: Enterococcus faecalis 8/21>> tracheal aspirate: No growth 8/21>> blood culture: No growth 8/22>> abscess abdomen: Enterococcus/Klebsiella 8/26>> pleural fluid culture: No growth 9/04>> blood culture: No growth 9/05>> urine culture: Pseudomonas. 9/06>> intra-abdominal/fluid collection culture: No growth.   Consults: PCCM, cardiology, palliative care, general  surgery, interventional radiology, neurology  Assessment and Plan: Severe Sepsis likely due to intra-abdominal abscess/possible aspiration PNA/complicated UTI: Due to intra-abdominal abscess-ongoing pneumonia-possible complicated UTI.  Unfortunately continued to decline overnight-after discussion with family transition to full comfort measures on 9/7.Marland Kitchen     Acute hypoxic respiratory failure Multifocal PNA Parapneumonic effusion-s/p chest tube placement-removed 8/1 Acute hypercarbic respiratory failure on 9/7-briefly on BiPAP.   Hospital course complicated by episodes of labored breathing-this is felt to be due to PNA/pleural effusions-significant anxiety, [place].  Unfortunately on 9/4-sepsis physiology reoccurred-patient was started on broad-spectrum antibiotics-CT chest showed persistent PNA/pleural effusion-unfortunately in spite of initiation of broad-spectrum antibiotics-patient's overall clinical condition continued to worsen.  On 9/7-she developed acute hypercarbic respiratory failure-briefly was placed on BiPAP overnight.  This morning-she continued to be very hypoxic with a Ventimask-continue to be labored-due to ongoing hypercarbia.  After discussion with family-she was transitioned to full comfort measures.     Ischemic bowel with perforation-s/p right colectomy and exploratory laparotomy on 8/7. S/p exploratory laparotomy-bowel anastomosis on 8/9 Complicated by anastomotic leak with peritonitis-s/p exploratory laparotomy, extensive LOA, resection of distal small bowel, and ileocolonic anastomosis, creation of end ileostomy on 8/23 Intra-abdominal fluid collection seen on CT abdomen on 9/5 s/p CT-guided drainage on 9/6 Empiric antibiotics restarted on 9/5 for intra-abdominal fluid collection-unfortunately on 9/7-sepsis physiology worsen-see above.   Septic shock with bowel perforation and intra-abdominal abscess:  Sepsis physiology had resolved-she had completed a course of IV  antibiotics-on 9/4-sepsis physiology reoccurred-see above.   PEA arrest Felt to be due to respiratory issues-continue telemetry monitoring.  Initial echo showed changes consistent with Takotsubo cardiomyopathy-thankfully a limited echo few days later showed normal EF.   Takotsubo cardiomyopathy with recovered EF Acute on chronic HFpEF Volume overload due to third spacing/hypoalbuminemia Volume status has improved-was maintained on IV Lasix and beta-blocker.  Persistent atrial fibrillation:  On beta-blocker-anticoagulated with Lovenox-with plans to transition to a oral agent once assure that no further procedures were required.   History of moderate pulmonary hypertension Plans were for outpatient follow-up with pulmonology and cardiology.   HTN BP stable on metoprolol.  History of bronchial asthma: Not in exacerbation-continue bronchodilators.   Acute metabolic encephalopathy Due to septic shock/peritonitis/hypoxemia/hypercarbia.  EEG was negative for seizures.    Normocytic anemia Due to critical illness-plans were to transfuse if significant drop.  No evidence of blood loss.   History of breast cancer-s/p right mastectomy on 01/24/2017-completed adjuvant radiation November 2018 Plans are to resume letrozole when able.   Severe debility/deconditioning Plans were for SNF on discharge.   Palliative care: DNR in place-extensive discussion with patient/family during this hospitalization-by this MD and by the palliative care team.  During her most latest discussion over the weekend-she was clear that she would not want life-prolonging measures including BiPAP/intubation.  Unfortunately with her acute deterioration overnight-this MD had a long discussion with patient's son Sherren Mocha (who was in [place])-and then with husband at bedside-we all agree that per patient's wishes-we would not initiate aggressive care-she was subsequently transition to full comfort measures.     Nutrition  Status: Nutrition Problem: Severe Malnutrition Etiology: acute illness Signs/Symptoms: mild fat depletion, mild muscle depletion, moderate muscle depletion, severe fat depletion Interventions: TPN, Boost Breeze, Refer to RD note for recommendations      The results of significant diagnostics from this hospitalization (including imaging, microbiology, ancillary and laboratory) are listed below for reference.   Significant Diagnostic Studies: DG CHEST PORT 1 VIEW  Result Date: 02/28/2022 CLINICAL DATA:  Acute respiratory failure with hypoxia EXAM: PORTABLE CHEST 1 VIEW COMPARISON:  02/08/2022 FINDINGS: Small to moderate right pleural effusion with associated elevation of the right hemidiaphragm is unchanged. Superimposed pulmonary infiltrates within the right upper lobe and left lung base are stable. Possible small left pleural effusion now present. No pneumothorax. Cardiac size is mildly enlarged, unchanged. Left upper extremity PICC line tip seen at the superior cavoatrial junction. No acute bone abnormality. IMPRESSION: 1. Stable right upper lobe and left basilar pulmonary infiltrates. 2. Stable small to moderate right pleural effusion. 3. Possible small left pleural effusion. Electronically Signed   By: Fidela Salisbury M.D.   On: 2022/02/28 02:57   CT GUIDED PERITONEAL/RETROPERITONEAL FLUID DRAIN BY PERC CATH  Result Date: 02/10/2022 INDICATION: History ischemic bowel with perforation requiring multiple operative interventions with abdominal CT performed 02/09/2022 demonstrating an indeterminate fluid collection within the ventral aspect of the left lower abdomen/pelvis. Request made for image guided placement of a percutaneous drainage catheter for infection source control purposes. EXAM: CT PERC DRAIN PERITONEAL ABCESS COMPARISON:  CT abdomen and pelvis - 02/09/2022 MEDICATIONS: The patient is currently admitted to the hospital and receiving intravenous antibiotics. The antibiotics were administered  within an appropriate time frame prior to the initiation of the procedure. ANESTHESIA/SEDATION: Moderate (conscious) sedation was employed during this procedure as administered by the Interventional Radiology RN. A total of Versed 0.5 mg and Fentanyl 25 mcg was administered intravenously. Moderate Sedation Time: 16 minutes. The patient's level of consciousness and vital signs were monitored continuously by radiology nursing throughout the procedure under my direct supervision. CONTRAST:  None COMPLICATIONS: None immediate. PROCEDURE: RADIATION DOSE REDUCTION: This exam was performed according to the departmental dose-optimization program which includes automated exposure control, adjustment of the mA and/or kV according to patient size and/or use of iterative reconstruction technique. Informed written consent was obtained from the patient after a discussion of the risks, benefits and alternatives to treatment. The patient was placed supine on the CT gantry and a pre procedural CT was performed re-demonstrating the known abscess/fluid collection within the ventral aspect of the lower abdomen/upper pelvis with dominant  component measuring approximately 13.5 x 5.8 cm (image 15, series 3). The procedure was planned. A timeout was performed prior to the initiation of the procedure. The skin overlying the ventral aspect of the left lower abdomen was prepped and draped in the usual sterile fashion. The overlying soft tissues were anesthetized with 1% lidocaine with epinephrine. Appropriate trajectory was planned with the use of a 22 gauge spinal needle. An 18 gauge trocar needle was advanced into the abscess/fluid collection and a short Amplatz super stiff wire was coiled within the collection. Appropriate positioning was confirmed with a limited CT scan. The tract was serially dilated allowing placement of a 12 [place] all-purpose drainage catheter. Appropriate positioning was confirmed with a limited postprocedural CT  scan. Next, approximately 500 cc of serous appearing fluid was aspirated. The tube was connected to a drainage bag and sutured in place. A dressing was applied. The patient tolerated the procedure well without immediate post procedural complication. IMPRESSION: Successful CT guided placement of a 63 French all purpose drain catheter into the indeterminate fluid collection within the ventral aspect of the lower abdomen/pelvis with aspiration of 500 mL of serous appearing fluid. Samples were sent to the laboratory as requested by the ordering clinical team. Electronically Signed   By: Sandi Mariscal M.D.   On: 02/10/2022 12:53   CT ABDOMEN PELVIS WO CONTRAST  Result Date: 02/09/2022 CLINICAL DATA:  Sepsis. EXAM: CT ABDOMEN AND PELVIS WITHOUT CONTRAST TECHNIQUE: Multidetector CT imaging of the abdomen and pelvis was performed following the standard protocol without IV contrast. RADIATION DOSE REDUCTION: This exam was performed according to the departmental dose-optimization program which includes automated exposure control, adjustment of the mA and/or kV according to patient size and/or use of iterative reconstruction technique. COMPARISON:  CT abdomen pelvis dated 01/24/2022. FINDINGS: Evaluation of this exam is limited in the absence of intravenous contrast. Lower chest: Trace left and partially visualized small right pleural effusions. There is consolidative changes of the visualized right lung base. Mild cardiomegaly. The tip of a central venous line noted in the right atrium. No intra-abdominal free air. Diffuse mesenteric stranding and edema. Hepatobiliary: Cirrhosis. No biliary dilatation. Small gallstone. No evidence of acute cholecystitis by CT. Pancreas: Unremarkable. No pancreatic ductal dilatation or surrounding inflammatory changes. Spleen: Normal in size without focal abnormality. Adrenals/Urinary Tract: The adrenal glands unremarkable. There is no hydronephrosis on either side. There is a 3 mm  nonobstructing right renal interpolar calculus or cortical calcification. The visualized ureters appear unremarkable. The urinary bladder is decompressed around a Foley catheter. Stomach/Bowel: There is postsurgical changes of the bowel with a right lower quadrant ostomy. There is sigmoid diverticulosis without active inflammatory changes. No evidence of bowel obstruction. Vascular/Lymphatic: Moderate aortoiliac atherosclerotic disease. The IVC is unremarkable. No portal venous gas. There is no adenopathy. Reproductive: The uterus is anteverted.  No adnexal masses. Other: There is a loculated fluid collection in the anterior abdomen measuring approximately 14 x 6 cm in greatest axial dimensions and 13 cm in craniocaudal length which may represent a seroma or developing abscess. Musculoskeletal: There is diffuse subcutaneous edema. Midline vertical anterior pelvic wall open surgical incision. Osteopenia with degenerative changes of the spine. No acute osseous pathology. IMPRESSION: 1. Postsurgical changes of the bowel with a right lower quadrant ostomy. No evidence of bowel obstruction. 2. A loculated fluid collection in the anterior abdomen may represent a seroma or developing abscess. 3. Cirrhosis. 4. Cholelithiasis. 5. Sigmoid diverticulosis. 6. Trace left and partially visualized small right pleural effusions  with consolidative changes of the visualized right lung base. 7. Aortic Atherosclerosis (ICD10-I70.0). Electronically Signed   By: Anner Crete M.D.   On: 02/09/2022 00:27   CT CHEST WO CONTRAST  Result Date: 02/08/2022 CLINICAL DATA:  Chronic dyspnea EXAM: CT CHEST WITHOUT CONTRAST TECHNIQUE: Multidetector CT imaging of the chest was performed following the standard protocol without IV contrast. RADIATION DOSE REDUCTION: This exam was performed according to the departmental dose-optimization program which includes automated exposure control, adjustment of the mA and/or kV according to patient size  and/or use of iterative reconstruction technique. COMPARISON:  Previous CT done on 01/03/2022 and chest radiograph done today FINDINGS: Cardiovascular: Heart is enlarged in size. There are scattered calcifications in thoracic aorta. Coronary artery calcifications are seen. Main pulmonary artery measures 3.3 cm suggesting possible pulmonary arterial hypertension. There is ectasia of ascending thoracic aorta measuring 3.6 cm. Mediastinum/Nodes: No significant lymphadenopathy seen in mediastinum. There is subcutaneous stranding in left anterior chest wall close to the axilla PICC line is noted in left upper extremity with its tip at the junction of superior vena cava and right atrium. Lungs/Pleura: There is linear patchy infiltrate in right upper lobe. There are patchy infiltrates in right middle lobe with atrial improvement. There is moderate size right pleural effusion. There is interval improvement in aeration in right lower lobe. There is residual partial atelectasis in inferior aspect of right lower lobe. Small patchy infiltrates are seen in left lower lobe and lingula. There is minimal left pleural effusion. There is no pneumothorax. Upper Abdomen: Unremarkable. Musculoskeletal: No acute findings are seen. Reconstruction prostheses are seen in both breasts. IMPRESSION: There is interval decrease in amount of right pleural effusion with moderate residual right pleural effusion. There is interval improvement in aeration in right middle lobe and right lower lobe suggesting decrease in atelectasis. Still, fairly extensive infiltrates are seen in right upper lobe, right middle lobe and right lower lobe suggesting atelectasis/pneumonia. Follow-up studies until complete clearing occurs should be considered to rule out any underlying neoplastic process. There are small patchy infiltrates in the lingula and left lower lobe suggesting subsegmental atelectasis/pneumonia. Minimal left pleural effusion is seen. Cardiomegaly.  Coronary artery calcifications are seen. There is ectasia of main pulmonary artery suggesting pulmonary arterial hypertension. There is subcutaneous stranding in the anterior chest wall close to the left axilla suggesting contusion or cellulitis. Electronically Signed   By: Elmer Picker M.D.   On: 02/08/2022 15:41   DG Chest Port 1V same Day  Result Date: 02/08/2022 CLINICAL DATA:  Shortness of breath. EXAM: PORTABLE CHEST 1 VIEW COMPARISON:  02/06/2022 FINDINGS: Left arm PICC line remains in appropriate position. Heart size is stable. Infiltrate or atelectasis in the right lung base shows no significant change. Layering right pleural effusion is also stable. Atelectasis or consolidation in the medial left lung base is also unchanged. IMPRESSION: No significant change in right greater than left basilar atelectasis versus infiltrates, and layering right pleural effusion. Electronically Signed   By: Marlaine Hind M.D.   On: 02/08/2022 09:43   DG CHEST PORT 1 VIEW  Result Date: 02/06/2022 CLINICAL DATA:  Shortness of breath EXAM: PORTABLE CHEST 1 VIEW COMPARISON:  Previous studies including the examination of 02/03/2022 FINDINGS: There is increased density in right mid and right lower lung fields with interval worsening. There is blunting of left lateral CP angle. There are no signs of alveolar pulmonary edema in the visualized lung fields. Evaluation of the right mid and both lower lung fields for  infiltrates is limited by effusions. Tip of left PICC line is seen at the junction of superior vena cava and right atrium. There is no pneumothorax. IMPRESSION: Bilateral pleural effusions, more so on the right side. There is interval increase in amount of right pleural effusion. Possibility of atelectasis/pneumonia in right mid and both lower lung fields is not excluded. There are no new infiltrates in the visualized lung fields. Electronically Signed   By: Elmer Picker M.D.   On: 02/06/2022 14:56   DG  Chest Port 1 View  Result Date: 02/03/2022 CLINICAL DATA:  Shortness of breath, pleural effusion. EXAM: PORTABLE CHEST 1 VIEW COMPARISON:  Chest radiograph February 01, 2022. FINDINGS: Left upper extremity PICC with tip overlying the right atrium. EKG leads coiled over the chest and upper abdomen. Cardiac silhouette is partially obscured but appears enlarged, unchanged. Increased patchy airspace disease in the right lung base with similar airspace disease in the left lung base. Small right-greater-than-left pleural effusions are similar prior. Right upper quadrant and right axillary surgical clips. No acute osseous abnormality. IMPRESSION: Increased patchy airspace disease in the right lung with similar airspace disease in the left lung base. No significant interval change in the small right-greater-than-left pleural effusions. Electronically Signed   By: Dahlia Bailiff M.D.   On: 02/03/2022 08:43   DG CHEST PORT 1 VIEW  Result Date: 02/01/2022 CLINICAL DATA:  Wheezing. EXAM: PORTABLE CHEST 1 VIEW COMPARISON:  02/01/2022. FINDINGS: The heart size and mediastinal contours are stable. There is elevation of the right diaphragm with stable patchy airspace disease in the right lung and at the left lung base. Small pleural effusions are noted bilaterally. No pneumothorax. A left-sided PICC line is stable in position. Surgical clips are present in the right upper quadrant. No acute osseous abnormality. IMPRESSION: 1. Elevation of the right diaphragm with slightly increased patchy airspace disease in the mid to lower right lung and left lung base. 2. Small bilateral pleural effusions. Electronically Signed   By: Brett Fairy M.D.   On: 02/01/2022 21:14   DG CHEST PORT 1 VIEW  Result Date: 02/01/2022 CLINICAL DATA:  Post thoracentesis EXAM: PORTABLE CHEST 1 VIEW COMPARISON:  Earlier same day FINDINGS: Decreased right pleural effusion. No pneumothorax. Persistent probable patchy right lung atelectasis. Similar left  pleural effusion and adjacent atelectasis. Stable cardiomediastinal contours. IMPRESSION: Decreased right pleural effusion and no pneumothorax post thoracentesis. Electronically Signed   By: Macy Mis M.D.   On: 02/01/2022 09:24   DG CHEST PORT 1 VIEW  Result Date: 02/01/2022 CLINICAL DATA:  Respiratory distress. EXAM: PORTABLE CHEST 1 VIEW COMPARISON:  01/31/2022. FINDINGS: Left arm PICC line tip is in the cavoatrial junction. Stable cardiomediastinal contours. Moderate to large right pleural effusion and small left pleural effusion unchanged from previous exam. There is significantly diminished aeration to the right lower lung and right midlung which appears unchanged from the previous study. IMPRESSION: 1. No change in aeration to the right lower lung and right midlung compared with previous exam. 2. Stable bilateral pleural effusions, right greater than left. Electronically Signed   By: Kerby Moors M.D.   On: 02/01/2022 06:54   DG CHEST PORT 1 VIEW  Result Date: 01/31/2022 CLINICAL DATA:  Acute respiratory failure EXAM: PORTABLE CHEST 1 VIEW COMPARISON:  Chest x-ray dated January 31, 2022 FINDINGS: Partially visualized cardiac and mediastinal contours are unchanged. Bibasilar atelectasis and moderate right and small left pleural effusions, unchanged. No evidence of pneumothorax. IMPRESSION: Bibasilar atelectasis and moderate right and small  left pleural effusions, unchanged. Electronically Signed   By: Yetta Glassman M.D.   On: 01/31/2022 16:05   DG Chest Port 1 View  Result Date: 01/31/2022 CLINICAL DATA:  Acute respiratory failure EXAM: PORTABLE CHEST 1 VIEW COMPARISON:  Prior chest x-ray yesterday 01/30/2022 FINDINGS: The patient has been extubated, the right central line removed and the nasogastric tube removed. The left upper extremity PICC remains in stable position with the tip overlying the right atrium. Stable cardiac and mediastinal contours. Inspiratory volumes are very low.  Increased streaky airspace opacities in the bases bilaterally consistent with atelectasis. Probable moderate right pleural effusion and small left pleural effusion. IMPRESSION: 1. Interval extubation, removal of gastric tube and right IJ central line. 2. Lower inspiratory volumes with increased bibasilar atelectasis. 3. Moderate right and small left pleural effusions. Electronically Signed   By: Jacqulynn Cadet M.D.   On: 01/31/2022 08:14   DG CHEST PORT 1 VIEW  Result Date: 01/30/2022 CLINICAL DATA:  [id number] status post thoracocentesis EXAM: PORTABLE CHEST 1 VIEW COMPARISON:  January 30, 2022 FINDINGS: The cardiomediastinal silhouette is unchanged in contour.ETT tip terminates 18 mm above the carina. Enteric tube tip and side port project over the proximal stomach. RIGHT neck CVC tip terminates over the expected region of superior cavoatrial junction. LEFT upper extremity PICC tip terminates over the expected region of the superior RIGHT atrium. Moderate RIGHT pleural effusion, decreased in comparison to prior. Small LEFT pleural effusion. No significant pneumothorax. Bilateral basilar predominant airspace opacities with mildly improved aeration of the RIGHT mid lung in comparison to prior. Visualized abdomen is unremarkable. IMPRESSION: Moderate RIGHT pleural effusion, decreased in comparison to prior. No significant pneumothorax is noted. Electronically Signed   By: Valentino Saxon M.D.   On: 01/30/2022 10:18   DG Chest Port 1 View  Result Date: 01/30/2022 CLINICAL DATA:  Pleural effusion follow-up EXAM: PORTABLE CHEST 1 VIEW COMPARISON:  January 27, 2022 FINDINGS: The ETT is in good position. The NG tube terminates below today's film. The left PICC line and right central line are stable. No pneumothorax. The right-sided pleural effusion with underlying opacity is larger in the interval. A smaller left effusion with underlying opacity is stable. No other interval changes. IMPRESSION: 1. Stable support  apparatus as above. 2. The right pleural effusion is moderate to large and larger in the interval. A small effusion is stable. Opacities underlying the bilateral effusions. Electronically Signed   By: Dorise Bullion III M.D.   On: 01/30/2022 08:24   DG CHEST PORT 1 VIEW  Result Date: 01/28/2022 CLINICAL DATA:  Endotracheal tube evaluation. EXAM: PORTABLE CHEST 1 VIEW COMPARISON:  Radiograph yesterday. FINDINGS: Endotracheal tube tip is 3.3 cm from the carina. Enteric tube in place, side-port below the diaphragm. There is a new right internal jugular central venous catheter tip overlying the mid SVC. Stable left upper extremity PICC. Improving right pleural effusion and basilar opacity. Stable retrocardiac opacity and small effusion. No pneumothorax. Stable heart size. IMPRESSION: 1. New right internal jugular central venous catheter with tip overlying the mid SVC. Endotracheal and enteric tubes in place. 2. Improving right pleural effusion and basilar opacity. Stable retrocardiac opacity and small effusion. Electronically Signed   By: Keith Rake M.D.   On: 01/28/2022 00:03   CT GUIDED VISCERAL FLUID DRAIN BY PERC CATH  Result Date: 01/26/2022 INDICATION: Right lower quadrant abscess by CT EXAM: CT DRAINAGE ANTERIOR RIGHT LOWER QUADRANT ABSCESS Date:  01/26/2022 01/26/2022 3:25 pm Radiologist:  Jerilynn Mages. Daryll Brod, MD  Guidance:  CT FLUOROSCOPY: None. MEDICATIONS: 1% lidocaine local ANESTHESIA/SEDATION: Moderate (conscious) sedation was employed during this procedure. A total of Versed 1.0 mg and Fentanyl 25 mcg was administered intravenously. Moderate Sedation Time: 17 minutes. The patient's level of consciousness and vital signs were monitored continuously by radiology nursing throughout the procedure under my direct supervision. CONTRAST:  None. COMPLICATIONS: None immediate. PROCEDURE: Informed consent was obtained from the patient following explanation of the procedure, risks, benefits and alternatives.  The patient understands, agrees and consents for the procedure. All questions were addressed. A time out was performed. Maximal barrier sterile technique utilized including caps, mask, sterile gowns, sterile gloves, large sterile drape, hand hygiene, and Betadine. Previous imaging reviewed. Patient positioned supine. Noncontrast localization CT performed. The anterior right lower quadrant air-fluid collection below the bowel anastomosis was localized and compared to the prior study. Air-fluid collection now contains oral contrast when compared to 01/24/2022 compatible with anastomotic leak. Under sterile conditions and local anesthesia, an 18 gauge 10 cm access needle was advanced from an anterolateral approach into the air-fluid collection. Needle position confirmed with CT. Syringe aspiration yielded fecal contaminated fluid. Guidewire inserted followed by tract dilatation to insert a 10 [place] drain. Drain catheter position confirmed with CT. Abscess decompressed by syringe aspiration. 30 cc fecal contaminated fluid aspirated. G stain and culture sent. Catheter secured with a Prolene suture and connected to external suction bulb. Sterile dressing applied. No immediate complication. Patient tolerated the procedure well. IMPRESSION: Successful CT-guided anterior right lower quadrant abscess drain placement. Electronically Signed   By: Jerilynn Mages.  Shick M.D.   On: 01/26/2022 15:46   DG CHEST PORT 1 VIEW  Result Date: 01/26/2022 CLINICAL DATA:  Endotracheal tube.  Respiratory distress. EXAM: PORTABLE CHEST 1 VIEW COMPARISON:  01/09/2022 FINDINGS: Endotracheal tube is in place, tip approximately 1.2 centimeters above the carina, directed towards the RIGHT mainstem bronchus. Consider withdrawing endotracheal tube 1.5 centimeters. Nasogastric tube side port overlies the level of proximal stomach. LEFT-sided PICC line tip overlies the superior vena cava. Heart margins are obscured. There is dense opacity throughout the LOWER  portion of the RIGHT lung, significantly increased compared with prior study. There is residual aerated lung at the RIGHT lung apex. There is subsegmental atelectasis at the MEDIAL LEFT lung base partially obscuring the hemidiaphragm. IMPRESSION: Significant increase in RIGHT pulmonary opacity. Endotracheal tube is directed towards the RIGHT mainstem bronchus, just above the carina. Consider withdrawing endotracheal tube 5 centimeters and reassessing position. These results will be called to the ordering clinician or representative by the Radiologist Assistant, and communication documented in the PACS or [employer]. Electronically Signed   By: Nolon Nations M.D.   On: 01/26/2022 08:25   DG Chest Port 1 View  Result Date: 02/03/2022 CLINICAL DATA:  Intubation EXAM: PORTABLE CHEST 1 VIEW COMPARISON:  Portable exam 1526 hours compared to 01/22/2022 FINDINGS: Tip of endotracheal tube projects 1.9 cm above carina. Nasogastric tube extends into stomach. LEFT arm PICC line tip projects over RIGHT atrium; recommend withdrawal 4 cm. Normal heart size, mediastinal contours, and pulmonary vascularity. Persistent RIGHT basilar opacity consistent with effusion and atelectasis versus infiltrate. LEFT basilar atelectasis, slightly increased. No pneumothorax. IMPRESSION: Recommend withdrawal of RIGHT arm PICC line 4 cm. Persistent RIGHT pleural effusion and basilar atelectasis versus infiltrate with slightly increased LEFT basilar atelectasis. Electronically Signed   By: Lavonia Dana M.D.   On: 01/14/2022 15:35   CT ABDOMEN PELVIS W CONTRAST  Result Date: 01/24/2022 CLINICAL DATA:  Abdominal pain, acute,  nonlocalized EXAM: CT ABDOMEN AND PELVIS WITH CONTRAST TECHNIQUE: Multidetector CT imaging of the abdomen and pelvis was performed using the standard protocol following bolus administration of intravenous contrast. RADIATION DOSE REDUCTION: This exam was performed according to the departmental dose-optimization  program which includes automated exposure control, adjustment of the mA and/or kV according to patient size and/or use of iterative reconstruction technique. CONTRAST:  172m OMNIPAQUE IOHEXOL 300 MG/ML  SOLN COMPARISON:  01/10/2022 FINDINGS: Lower chest: Focal consolidation within the basilar right middle lobe is again seen, not fully assessed on this examination. Moderate right pleural effusion appears slightly enlarged since prior examination with compressive atelectasis of the right lower lobe. Cardiac size is mildly enlarged. Central venous catheter tip noted within the right atrium. Bilateral breast implants are noted. Surgical changes of right mastectomy are suspected. Hepatobiliary: No focal liver abnormality is seen. No gallstones, gallbladder wall thickening, or biliary dilatation. Pancreas: Unremarkable Spleen: Unremarkable Adrenals/Urinary Tract: The adrenal glands are unremarkable. The kidneys are normal. The bladder is largely decompressed. Stomach/Bowel: Mild descending and sigmoid colonic diverticulosis without superimposed acute inflammatory change. Surgical changes of right hemicolectomy are identified. A extraluminal collection of gas and fluid is seen within the right mid abdomen anteriorly measuring 2.9 x 5.9 x 8.1 cm in greatest dimension, possibly representing an anastomotic leak given its close association with the anastomotic staple line, postoperative fluid collection, or abscess. There is extensive infiltrative change within the mesentery of the right mid abdomen, possibly postsurgical in nature or inflammatory given the adjacent fluid collection. Mild ascites is present. Stomach is unremarkable. Mild bowel wall thickening involving several loops of distal small bowel within the pelvis, again possibly postsurgical in nature. No evidence of obstruction. No free intraperitoneal gas. Vascular/Lymphatic: Aortic atherosclerosis. No enlarged abdominal or pelvic lymph nodes. Reproductive: Uterus  and bilateral adnexa are unremarkable. Other: Mild diffuse subcutaneous body wall edema in keeping with anasarca. Superficial dehiscence of the midline laparotomy wound. The abdominal fascia appears intact. Musculoskeletal: Degenerative changes seen within the lumbar spine. No lytic or blastic bone lesion. IMPRESSION: 1. Surgical changes of right hemicolectomy. Extraluminal collection of gas and fluid within the right mid abdomen anteriorly measuring 2.9 x 5.9 x 8.1 cm in greatest dimension, possibly representing an anastomotic leak given its close association with the anastomotic staple line, postoperative fluid collection, or abscess. 2. Extensive infiltrative change within the mesentery of the right mid abdomen, possibly postsurgical in nature or inflammatory given the adjacent fluid collection. 3. Mild bowel wall thickening involving several loops of distal small bowel within the pelvis, again possibly postsurgical in nature. No evidence of obstruction. 4. Slight interval enlargement of right pleural effusion, progressive body wall subcutaneous edema, and development of mild ascites in keeping with progressive anasarca. 5. Focal consolidation within the basilar right middle lobe again noted, not fully assessed on this examination. Aortic Atherosclerosis (ICD10-I70.0). Electronically Signed   By: AFidela SalisburyM.D.   On: 01/24/2022 01:33   DG Chest Port 1 View  Result Date: 01/22/2022 CLINICAL DATA:  Fever.  Respiratory distress. EXAM: PORTABLE CHEST 1 VIEW COMPARISON:  Radiograph 01/19/2022.  CT 01/03/2022 FINDINGS: Left upper extremity PICC tip unchanged. Moderate-sized right pleural effusion is minimally improved from prior exam. There may be slight improvement in right perihilar airspace disease. Minor atelectasis at the left lung base. No other left lung consolidation. No pneumothorax. Stable heart size and mediastinal contours. IMPRESSION: 1. Moderate-sized right pleural effusion is minimally improved  from prior exam. 2. Slight improvement in right perihilar airspace  disease. 3. Minor left basilar atelectasis. Electronically Signed   By: Keith Rake M.D.   On: 01/22/2022 18:15   VAS [place] UPPER EXTREMITY VENOUS DUPLEX  Result Date: 01/21/2022 UPPER VENOUS STUDY  Patient Name:  KORALYN PRESTAGE  Date of Exam:   01/21/2022 Medical Rec #: [id number]        Accession #:    [id number] Date of Birth: 1940/07/30        Patient Gender: F Patient Age:   49 years Exam Location:  [facility] Procedure:      VAS [place] UPPER EXTREMITY VENOUS DUPLEX Referring Phys: [facility] EZENDUKA --------------------------------------------------------------------------------  Indications: Left arm swelling and erythema, PICC Comparison Study: No prior studies. Performing Technologist: Darlin Coco RDMS, RVT  Examination Guidelines: A complete evaluation includes B-mode imaging, spectral Doppler, color Doppler, and power Doppler as needed of all accessible portions of each vessel. Bilateral testing is considered an integral part of a complete examination. Limited examinations for reoccurring indications may be performed as noted.  Right Findings: +----------+------------+---------+-----------+----------+-------+ RIGHT     CompressiblePhasicitySpontaneousPropertiesSummary +----------+------------+---------+-----------+----------+-------+ Subclavian               Yes       Yes                      +----------+------------+---------+-----------+----------+-------+  Left Findings: +----------+------------+---------+-----------+----------+---------------+ LEFT      CompressiblePhasicitySpontaneousProperties    Summary     +----------+------------+---------+-----------+----------+---------------+ IJV           Full       Yes       Yes                              +----------+------------+---------+-----------+----------+---------------+ Subclavian    Full       Yes       Yes                               +----------+------------+---------+-----------+----------+---------------+ Axillary      Full       Yes       Yes                              +----------+------------+---------+-----------+----------+---------------+ Brachial      Full                                                  +----------+------------+---------+-----------+----------+---------------+ Radial        Full                                                  +----------+------------+---------+-----------+----------+---------------+ Ulnar         Full                                                  +----------+------------+---------+-----------+----------+---------------+ Cephalic      Full                                                  +----------+------------+---------+-----------+----------+---------------+  Basilic       Full                                  PICC visualized +----------+------------+---------+-----------+----------+---------------+  Summary:  Right: No evidence of thrombosis in the subclavian.  Left: No evidence of deep vein thrombosis in the upper extremity. No evidence of superficial vein thrombosis in the upper extremity.  *See table(s) above for measurements and observations.  Diagnosing physician: Monica Martinez MD Electronically signed by Monica Martinez MD on 01/21/2022 at 3:19:11 PM.    Final    DG CHEST PORT 1 VIEW  Result Date: 01/19/2022 CLINICAL DATA:  Dyspnea EXAM: PORTABLE CHEST 1 VIEW COMPARISON:  02/04/2022 FINDINGS: No significant change in AP portable examination. Large right pleural effusion and associated atelectasis or consolidation. The left lung is normally aerated. Heart and mediastinum unremarkable. Left upper extremity PICC. IMPRESSION: No significant change in AP portable examination. Large right pleural effusion and associated atelectasis or consolidation. The left lung is normally aerated. No new airspace opacity. Electronically Signed   By: Delanna Ahmadi M.D.   On: 01/19/2022 08:47   DG Chest Port 1 View  Result Date: 01/12/2022 CLINICAL DATA:  Acute respiratory distress. EXAM: PORTABLE CHEST 1 VIEW COMPARISON:  January 18, 2022 (10:04 a.m.) FINDINGS: There is stable left-sided PICC line positioning. The cardiac silhouette is enlarged and unchanged in size. There is stable opacification of the mid and lower right lung. The left lung is clear. No pneumothorax is identified. The visualized skeletal structures are unremarkable. IMPRESSION: Stable opacification of mid and lower right lung, likely representing a combination of pleural effusion and atelectasis/infiltrate. Electronically Signed   By: Virgina Norfolk M.D.   On: 01/19/2022 19:31   ECHOCARDIOGRAM LIMITED  Result Date: 01/30/2022    ECHOCARDIOGRAM LIMITED REPORT   Patient Name:   JUNEAU DOUGHMAN Date of Exam: 01/15/2022 Medical Rec #:  [id number]       Height:       60.0 in Accession #:    [id number]      Weight:       166.0 lb Date of Birth:  1940/12/23       BSA:          1.725 m Patient Age:    75 years        BP:           161/70 mmHg Patient Gender: F               HR:           78 bpm. Exam Location:  Inpatient Procedure: Limited Echo, Intracardiac Opacification Agent, Limited Color Doppler            and Cardiac Doppler Indications:    Congestive Heart Failure I50.9  History:        Patient has prior history of Echocardiogram examinations, most                 recent 01/04/2022. Arrythmias:Atrial Fibrillation; Risk                 Factors:GERD and Hypertension.  Sonographer:    Bernadene Person RDCS Referring Phys: Craig  1. Left ventricular ejection fraction, by estimation, is 60 to 65%. Left ventricular ejection fraction by 2D MOD biplane is 60.6 %. The left ventricle has normal function. The left ventricle has no regional wall motion abnormalities. There is  mild left ventricular hypertrophy. Left ventricular diastolic function could not be evaluated.  2. Right  ventricular systolic function is moderately reduced. The right ventricular size is normal. There is severely elevated pulmonary artery systolic pressure. The estimated right ventricular systolic pressure is 24.4 mmHg.  3. The mitral valve is grossly normal. Trivial mitral valve regurgitation.  4. The aortic valve is tricuspid. Aortic valve regurgitation is mild to moderate. Aortic valve sclerosis is present, with no evidence of aortic valve stenosis. Aortic regurgitation PHT measures 227 msec.  5. The inferior vena cava is normal in size with <50% respiratory variability, suggesting right atrial pressure of 8 mmHg. Comparison(s): Changes from prior study are noted. 01/04/2022: LVEF 25-30%, Takatsubo-type wall motion abnormalities. FINDINGS  Left Ventricle: Left ventricular ejection fraction, by estimation, is 60 to 65%. Left ventricular ejection fraction by 2D MOD biplane is 60.6 %. The left ventricle has normal function. The left ventricle has no regional wall motion abnormalities. The left ventricular internal cavity size was normal in size. There is mild left ventricular hypertrophy. Left ventricular diastolic function could not be evaluated. Left ventricular diastolic function could not be evaluated due to atrial fibrillation. Right Ventricle: The right ventricular size is normal. No increase in right ventricular wall thickness. Right ventricular systolic function is moderately reduced. There is severely elevated pulmonary artery systolic pressure. The tricuspid regurgitant velocity is 4.17 m/s, and with an assumed right atrial pressure of 8 mmHg, the estimated right ventricular systolic pressure is 01.0 mmHg. Mitral Valve: The mitral valve is grossly normal. Trivial mitral valve regurgitation. Tricuspid Valve: The tricuspid valve is grossly normal. Tricuspid valve regurgitation is mild. Aortic Valve: The aortic valve is tricuspid. Aortic valve regurgitation is mild to moderate. Aortic regurgitation PHT measures  227 msec. Aortic valve sclerosis is present, with no evidence of aortic valve stenosis. Pulmonic Valve: The pulmonic valve was grossly normal. Pulmonic valve regurgitation is trivial. Aorta: The aortic root and ascending aorta are structurally normal, with no evidence of dilitation. Venous: The inferior vena cava is normal in size with less than 50% respiratory variability, suggesting right atrial pressure of 8 mmHg. LEFT VENTRICLE PLAX 2D                        Biplane EF (MOD) LVIDd:         4.50 cm         LV Biplane EF:   Left LVIDs:         2.80 cm                          ventricular LV PW:         1.10 cm                          ejection LV IVS:        1.10 cm                          fraction by LVOT diam:     2.00 cm                          2D MOD LV SV:         62  biplane is LV SV Index:   36                               60.6 %. LVOT Area:     3.14 cm  LV Volumes (MOD) LV vol d, MOD    53.2 ml A2C: LV vol d, MOD    41.1 ml A4C: LV vol s, MOD    19.5 ml A2C: LV vol s, MOD    17.1 ml A4C: LV SV MOD A2C:   33.7 ml LV SV MOD A4C:   41.1 ml LV SV MOD BP:    30.0 ml RIGHT VENTRICLE TAPSE (M-mode): 1.2 cm LEFT ATRIUM         Index LA diam:    3.90 cm 2.26 cm/m  AORTIC VALVE LVOT Vmax:   110.00 cm/s LVOT Vmean:  72.700 cm/s LVOT VTI:    0.196 m AI PHT:      227 msec  AORTA Ao Root diam: 2.90 cm TRICUSPID VALVE TR Peak grad:   69.6 mmHg TR Vmax:        417.00 cm/s  SHUNTS Systemic VTI:  0.20 m Systemic Diam: 2.00 cm Lyman Bishop MD Electronically signed by Lyman Bishop MD Signature Date/Time: 01/29/2022/4:21:18 PM    Final    DG CHEST PORT 1 VIEW  Result Date: 01/17/2022 CLINICAL DATA:  Respiratory distress, shortness of breath EXAM: PORTABLE CHEST 1 VIEW COMPARISON:  01/17/2022 FINDINGS: Persistent right pleural effusion with adjacent atelectasis/consolidation. Change in appearance may be due to differences in positioning. Persistent probable mild atelectasis at the left  lung base. Similar partially obscured cardiomediastinal contours. Left subclavian line is unchanged. IMPRESSION: Persistent right pleural effusion and adjacent atelectasis/consolidation. Electronically Signed   By: Macy Mis M.D.   On: 01/07/2022 10:22   DG CHEST PORT 1 VIEW  Result Date: 01/17/2022 CLINICAL DATA:  Shortness of breath. EXAM: PORTABLE CHEST 1 VIEW COMPARISON:  One-view chest x-ray 02/04/2022 FINDINGS: Patient has been extubated. The heart is enlarged. Right pleural effusion and right-sided airspace disease has increased. Minimal atelectasis at the left base is stable. Surgical clips are again noted in the right breast or axilla. IMPRESSION: 1. Increasing right pleural effusion and right-sided airspace disease. While this likely reflects atelectasis, infection is not excluded. 2. Stable cardiomegaly without failure. Electronically Signed   By: San Morelle M.D.   On: 01/17/2022 11:39   DG Chest Port 1 View  Result Date: 01/26/2022 CLINICAL DATA:  Endotracheal tube and site 2.  Respiratory distress. EXAM: PORTABLE CHEST 1 VIEW COMPARISON:  One-view chest x-ray 01/12/2022 FINDINGS: Endotracheal tube terminates 1.5 cm above the carina and could be pulled back 1-2 cm for more optimal positioning. A Swan-Ganz catheter terminates at the pulmonary outflow tract. Enteric tube terminates in the stomach. A left-sided PICC line is in place. The tip is in the right atrium. Right greater left pleural effusion remains. Bibasilar airspace disease is noted. IMPRESSION: 1. Endotracheal tube terminates 1.5 cm above the carina and could be pulled back 1-2 cm for more optimal positioning. 2. Swan-Ganz catheter terminates at the pulmonary outflow tract. 3. New left-sided PICC line terminates in the right atrium. 4. Enteric tube terminates in the stomach. 5. Right greater than left pleural effusions and bibasilar airspace opacities. This likely reflects atelectasis. Infection is not excluded.  Electronically Signed   By: San Morelle M.D.   On: 01/21/2022 08:33   DG Chest [facility]  Result Date: 01/12/2022 CLINICAL DATA:  PICC line placement.  Endotracheal tube placement. EXAM: PORTABLE CHEST 1 VIEW COMPARISON:  Same day at 5:14 a.m. FINDINGS: Endotracheal tube has tip 2.6 cm above the carina. Right IJ Swan-Ganz catheter is present with tip over the main pulmonary artery segment. Interval placement of left-sided PICC line with tip at the cavoatrial junction. Nasogastric tube looped once in the stomach with tip in the right upper quadrant unchanged. Lungs are adequately inflated with moderate opacification over the right mid to lower lung likely effusion with associated atelectasis. Chest of a small amount left pleural fluid with basilar atelectasis. Infection over the lung bases is possible. Cardiomediastinal silhouette and remainder of the exam is unchanged. IMPRESSION: 1. Persistent opacification over the right mid to lower lung likely effusion with associated atelectasis. Small amount left pleural fluid with basilar atelectasis. Infection over the lung bases is possible. 2. Tubes and lines as described. Left PICC line with tip at the cavoatrial junction. Electronically Signed   By: Marin Olp M.D.   On: 01/12/2022 12:53    Microbiology: Recent Results (from the past 240 hour(s))  Culture, blood (Routine X 2) w Reflex to ID Panel     Status: None (Preliminary result)   Collection Time: 02/08/22  9:48 PM   Specimen: BLOOD  Result Value Ref Range Status   Specimen Description BLOOD RIGHT ANTECUBITAL  Final   Special Requests   Final    BOTTLES DRAWN AEROBIC AND ANAEROBIC Blood Culture adequate volume   Culture   Final    NO GROWTH 3 DAYS Performed at [facility], [address], [place], [place] [address]    Report Status PENDING  Incomplete  Culture, blood (Routine X 2) w Reflex to ID Panel     Status: None (Preliminary result)   Collection Time: 02/08/22  9:50 PM    Specimen: BLOOD RIGHT HAND  Result Value Ref Range Status   Specimen Description BLOOD RIGHT HAND  Final   Special Requests   Final    BOTTLES DRAWN AEROBIC AND ANAEROBIC Blood Culture adequate volume   Culture   Final    NO GROWTH 3 DAYS Performed at [facility], [place] [address], [place], [place] [address]    Report Status PENDING  Incomplete  Remove and replace urinary cath (placed > 5 days) then obtain urine culture from new indwelling urinary catheter.     Status: Abnormal   Collection Time: 02/09/22  5:13 AM   Specimen: Urine, Catheterized  Result Value Ref Range Status   Specimen Description URINE, CATHETERIZED  Final   Special Requests   Final    NONE Performed at [facility], [address], [place], [place] [address]    Culture >=100,000 COLONIES/mL PSEUDOMONAS AERUGINOSA (A)  Final   Report Status 2022/03/05 FINAL  Final   Organism ID, Bacteria PSEUDOMONAS AERUGINOSA (A)  Final      Susceptibility   Pseudomonas aeruginosa - MIC*    CEFTAZIDIME >=64 RESISTANT Resistant     CIPROFLOXACIN <=0.25 SENSITIVE Sensitive     GENTAMICIN 2 SENSITIVE Sensitive     IMIPENEM 2 SENSITIVE Sensitive     * >=100,000 COLONIES/mL PSEUDOMONAS AERUGINOSA  Surgical PCR screen     Status: None   Collection Time: 02/10/22  1:10 AM   Specimen: Nasal Mucosa; Nasal Swab  Result Value Ref Range Status   MRSA, PCR NEGATIVE NEGATIVE Final   Staphylococcus aureus NEGATIVE NEGATIVE Final    Comment: (  NOTE) The Xpert SA Assay (FDA approved for NASAL specimens in patients 13 years of age and older), is one component of a comprehensive surveillance program. It is not intended to diagnose infection nor to guide or monitor treatment. Performed at [facility], [place] [address] [place], [place] [address]   Aerobic/Anaerobic Culture w Gram Stain (surgical/deep wound)     Status: None (Preliminary result)   Collection Time: 02/10/22 10:26 AM   Specimen: Abscess  Result Value Ref  Range Status   Specimen Description ABSCESS  Final   Special Requests NONE  Final   Gram Stain NO ORGANISMS SEEN NO WBC SEEN   Final   Culture   Final    NO GROWTH < 24 HOURS Performed at [facility], [place] [address] [place], [place] [address]    Report Status PENDING  Incomplete    Time spent: 35 minutes  Signed: Oren Binet, MD March 04, 2022

## 2022-03-07 NOTE — Progress Notes (Signed)
Pt was removed from BiPAP and placed on Venturi mask (12L 50%) due to mouth breathing. Pt breathing shallow and in low 30's. Pt is currently stable at this time, BiPAP on standby if needed.

## 2022-03-07 NOTE — Progress Notes (Signed)
Brief note (full note to follow) Markedly worse overnight-febrile-tachycardic-significantly lethargic and hypoxic.  Based on our prior discussions with the patient-she did not want any aggressive care including BiPAP/ICU level of care if she were to worsen again.  This MD reached out to her son Sherren Mocha x2-and also spoke at length to her husband at bedside.  Everyone is aware of patient's wishes-and agreeable to transition to full comfort measures per her wishes.  I have informed our surgical colleagues and also palliative care team as well.  Starting comfort measures-after the spouse comes back in an hour or so-he quickly needs to go home and walk the dog etc.

## 2022-03-07 NOTE — Progress Notes (Signed)
100 mL of Dilaudid wasted in [employer] container witnessed by Philis Pique.

## 2022-03-07 DEATH — deceased

## 2022-03-27 ENCOUNTER — Other Ambulatory Visit: Payer: Self-pay | Admitting: Hematology and Oncology

## 2022-03-28 ENCOUNTER — Other Ambulatory Visit: Payer: Self-pay | Admitting: Cardiology

## 2022-03-28 DIAGNOSIS — I10 Essential (primary) hypertension: Secondary | ICD-10-CM

## 2022-05-07 ENCOUNTER — Ambulatory Visit: Payer: Medicare Other | Admitting: Cardiology

## 2022-05-28 ENCOUNTER — Ambulatory Visit: Payer: Medicare Other | Admitting: Cardiology

## 2022-09-29 IMAGING — CT CT HEAD W/O CM
4 series · 16 of 47 positions shown, 18 images · non-contrast
Comparison: CT orbits 02/29/2020, CT head 02/29/2020

CLINICAL DATA: Headaches- right side, fell and hit left eye
07-23-2020, black eye Breast right mastectomy radiation Htn on meds
Nonsmoker Prev in pacs

EXAM:
CT HEAD WITHOUT CONTRAST
TECHNIQUE: Contiguous axial images were obtained from the base of the skull
through the vertex without intravenous contrast.

[Series 2: head 5.00 hr40 s3 axial ibhc · axial · 0.43mm/px · z∈[-576,-466]mm · 7 of 30 slices shown, 9 images]
[im 4/30  brain]
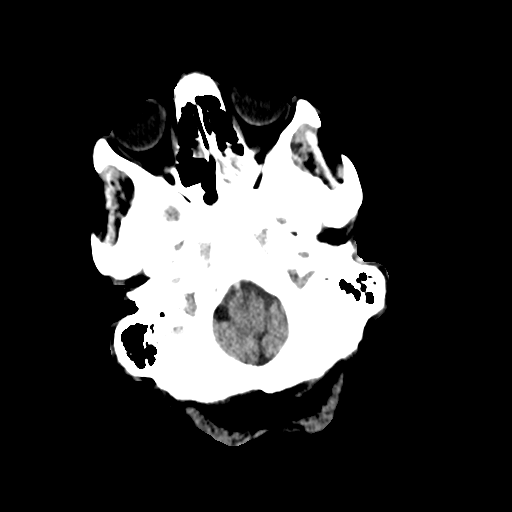
[im 4/30  bone]
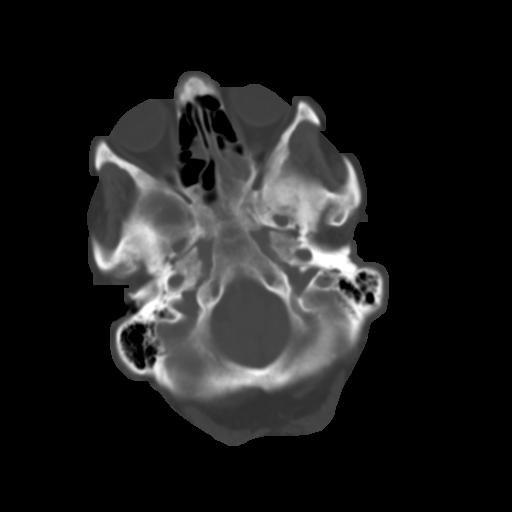
[im 8/30  brain]
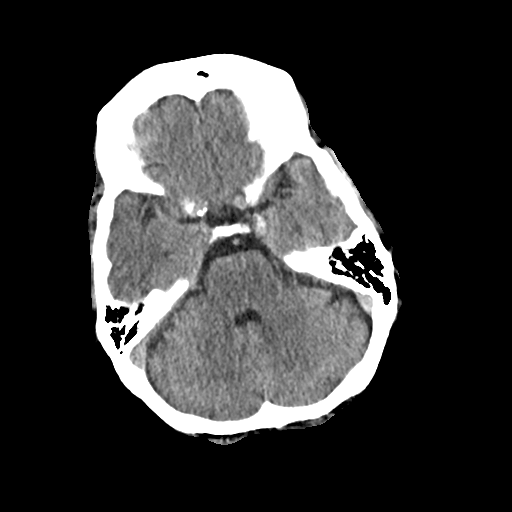
[im 11/30  brain]
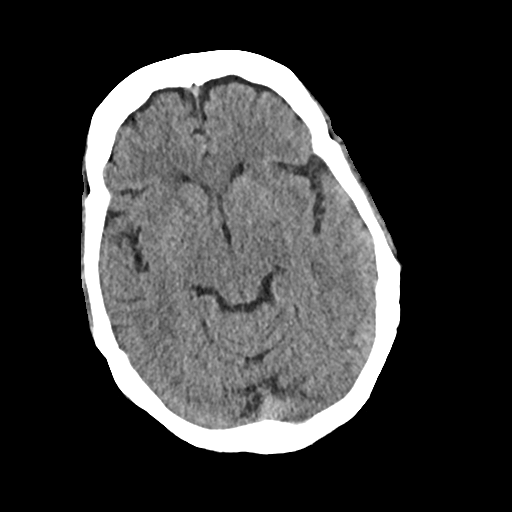
[im 15/30  brain]
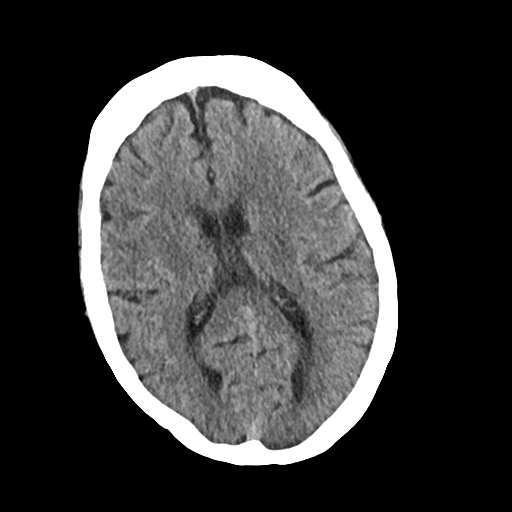
[im 19/30  brain]
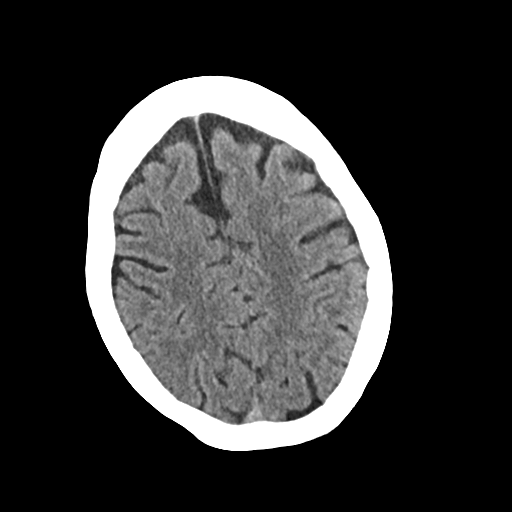
[im 19/30  bone]
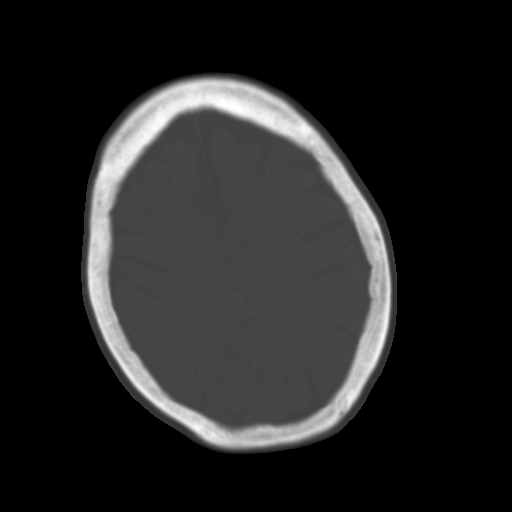
[im 22/30  brain]
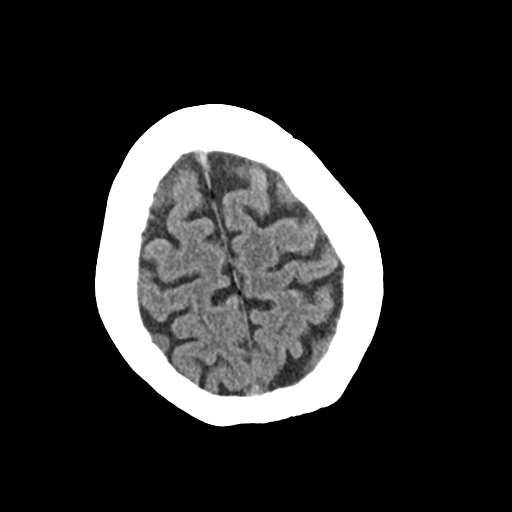
[im 26/30  brain]
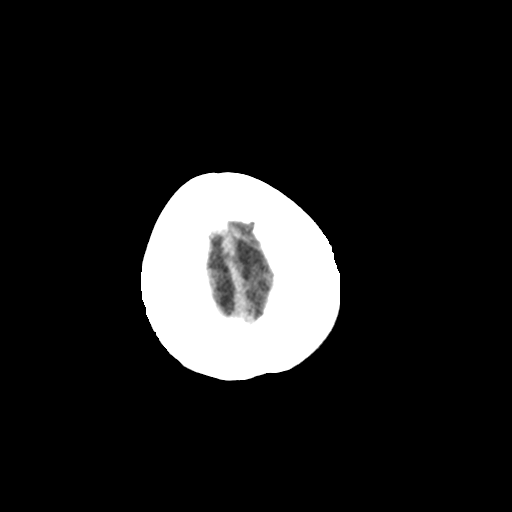

[Series 3: head 2.00 hr60 s3 axial bone · axial · 0.43mm/px · z∈[-579,-549]mm · 3 of 75 slices shown]
[im 8/75  bone]
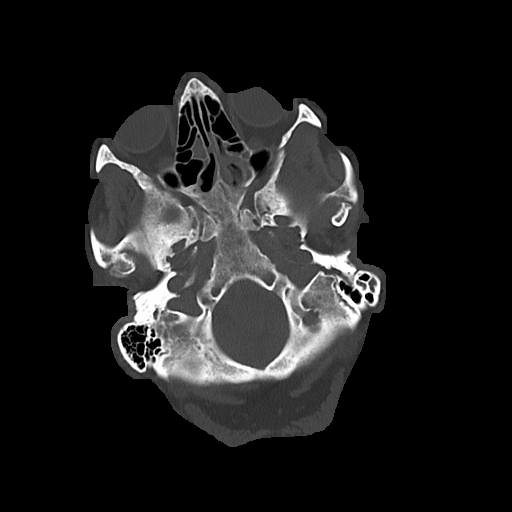
[im 15/75  bone]
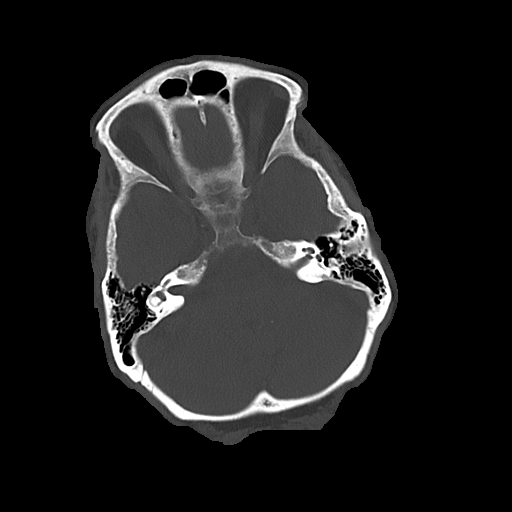
[im 23/75  bone]
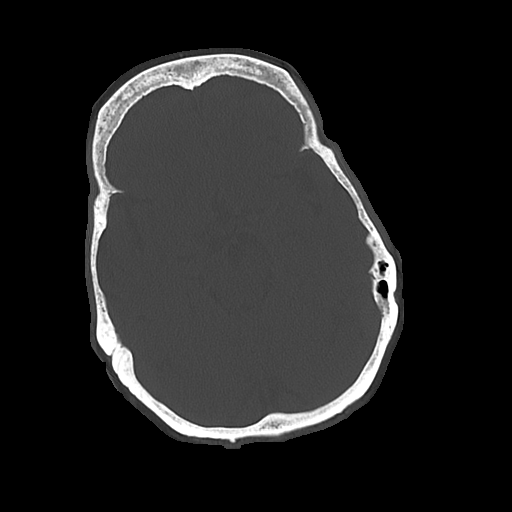

[Series 4: head 3.00 hr40 s3 sag · sagittal · 0.30mm/px · 3 of 57 slices shown]
[im 19/57  brain]
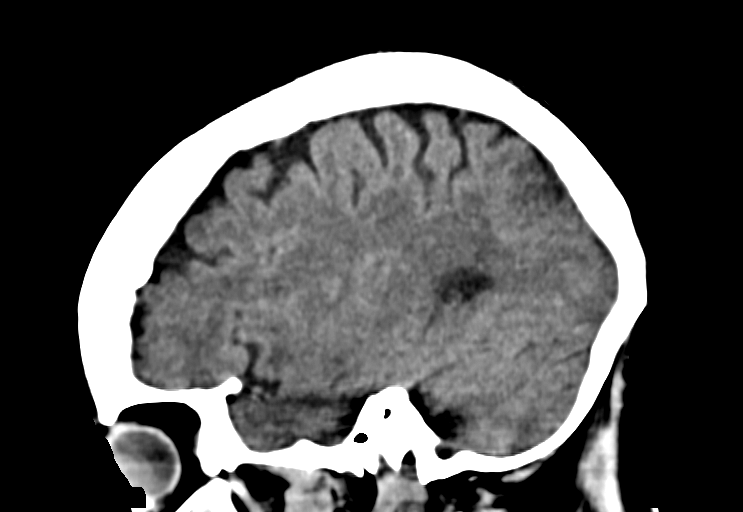
[im 29/57  brain]
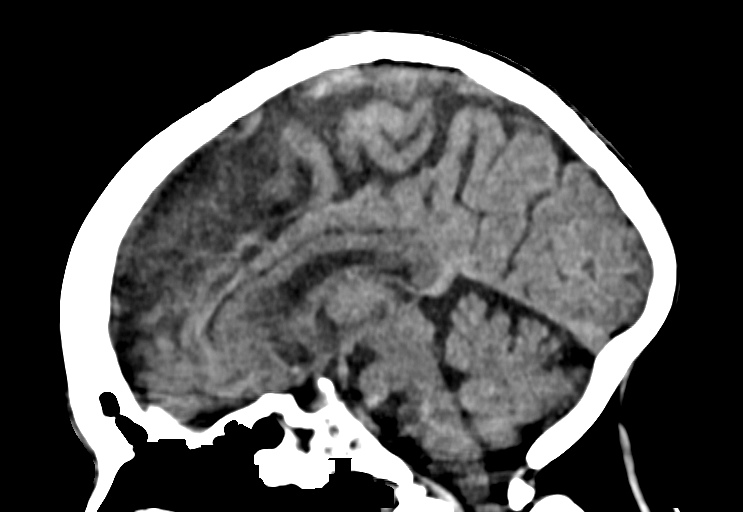
[im 38/57  brain]
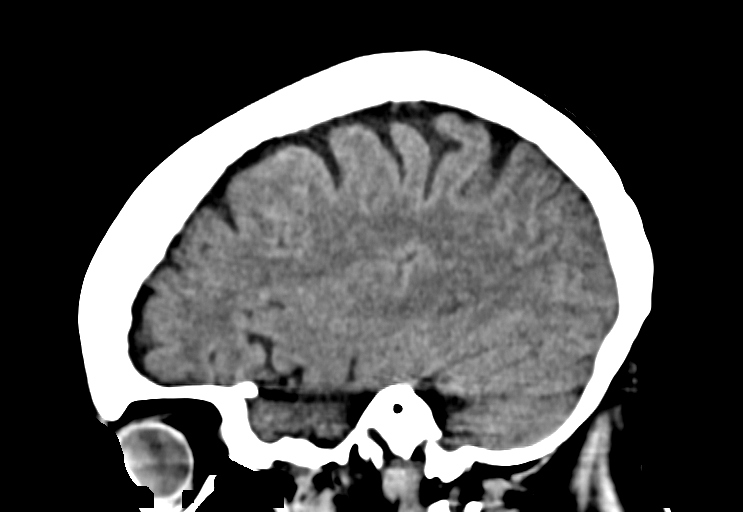

[Series 6: head 3.00 hr40 s3 cor · coronal · 0.30mm/px · 3 of 73 slices shown]
[im 25/73  brain]
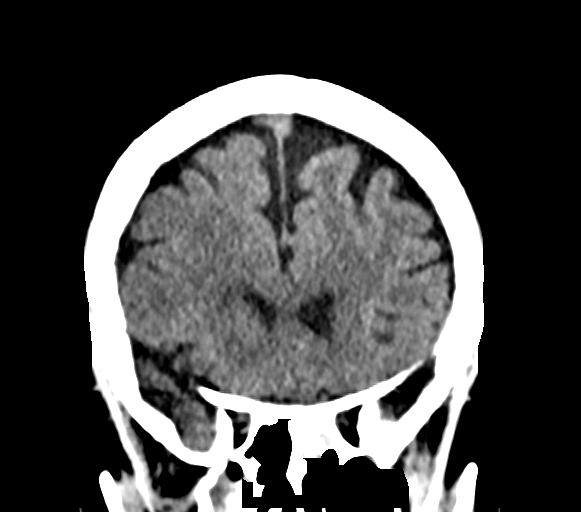
[im 33/73  brain]
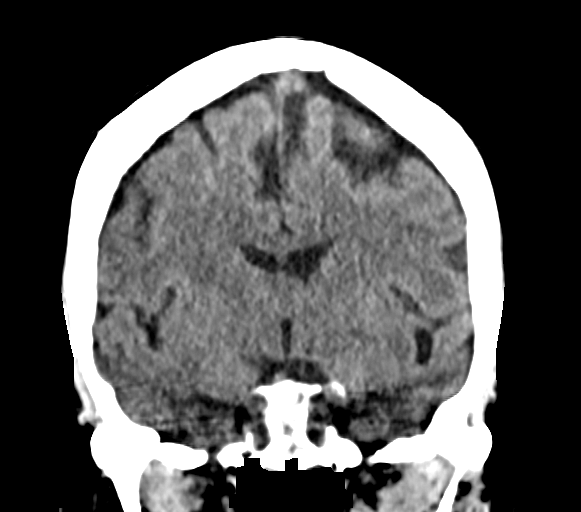
[im 41/73  brain]
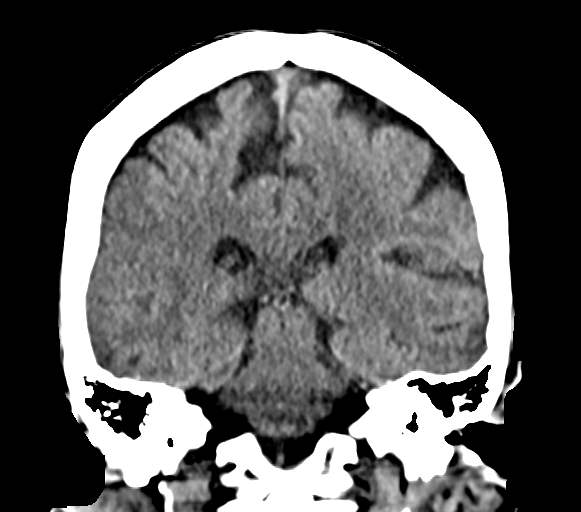

[16 of 47 positions shown; findings below may reference images not displayed]

FINDINGS: Brain:

No evidence of large-territorial acute infarction. No parenchymal
hemorrhage. No mass lesion. No extra-axial collection.

No mass effect or midline shift. No hydrocephalus. Basilar cisterns
are patent.

Vascular: No hyperdense vessel.

Skull: No acute fracture or focal lesion.

Sinuses/Orbits: Most complete opacification of the right maxillary
sinus. Bilateral ethmoid and sphenoid sinus mucosal thickening.
Associated hyperostosis of the sinus walls. Paranasal sinuses and
mastoid air cells are clear. The orbits are unremarkable.

Other: None.
IMPRESSION: 1. No acute intracranial abnormality.
2. Chronic sinusitis. Correlate clinically with signs and symptoms
of acute sinusitis.
